# Patient Record
Sex: Female | Born: 1940 | Race: White | Hispanic: No | Marital: Married | Smoking: Former smoker
Health system: Southern US, Community
[De-identification: ages and names within clinical notes are randomized; demographics above are authoritative.]

## PROBLEM LIST (undated history)

## (undated) DIAGNOSIS — R112 Nausea with vomiting, unspecified: Secondary | ICD-10-CM

## (undated) DIAGNOSIS — R2681 Unsteadiness on feet: Secondary | ICD-10-CM

## (undated) DIAGNOSIS — I6529 Occlusion and stenosis of unspecified carotid artery: Secondary | ICD-10-CM

## (undated) DIAGNOSIS — Z9889 Other specified postprocedural states: Secondary | ICD-10-CM

## (undated) DIAGNOSIS — J189 Pneumonia, unspecified organism: Secondary | ICD-10-CM

## (undated) DIAGNOSIS — M545 Low back pain, unspecified: Secondary | ICD-10-CM

## (undated) DIAGNOSIS — I219 Acute myocardial infarction, unspecified: Secondary | ICD-10-CM

## (undated) DIAGNOSIS — E1140 Type 2 diabetes mellitus with diabetic neuropathy, unspecified: Secondary | ICD-10-CM

## (undated) DIAGNOSIS — I639 Cerebral infarction, unspecified: Secondary | ICD-10-CM

## (undated) DIAGNOSIS — N189 Chronic kidney disease, unspecified: Secondary | ICD-10-CM

## (undated) DIAGNOSIS — K219 Gastro-esophageal reflux disease without esophagitis: Secondary | ICD-10-CM

## (undated) DIAGNOSIS — D696 Thrombocytopenia, unspecified: Secondary | ICD-10-CM

## (undated) DIAGNOSIS — I10 Essential (primary) hypertension: Secondary | ICD-10-CM

## (undated) DIAGNOSIS — G8929 Other chronic pain: Secondary | ICD-10-CM

## (undated) DIAGNOSIS — D649 Anemia, unspecified: Secondary | ICD-10-CM

## (undated) DIAGNOSIS — I350 Nonrheumatic aortic (valve) stenosis: Secondary | ICD-10-CM

## (undated) DIAGNOSIS — E785 Hyperlipidemia, unspecified: Secondary | ICD-10-CM

## (undated) DIAGNOSIS — M1990 Unspecified osteoarthritis, unspecified site: Secondary | ICD-10-CM

## (undated) DIAGNOSIS — I2510 Atherosclerotic heart disease of native coronary artery without angina pectoris: Secondary | ICD-10-CM

## (undated) DIAGNOSIS — R011 Cardiac murmur, unspecified: Secondary | ICD-10-CM

## (undated) DIAGNOSIS — I5030 Unspecified diastolic (congestive) heart failure: Secondary | ICD-10-CM

## (undated) DIAGNOSIS — F419 Anxiety disorder, unspecified: Secondary | ICD-10-CM

## (undated) HISTORY — DX: Anxiety disorder, unspecified: F41.9

## (undated) HISTORY — DX: Low back pain, unspecified: M54.50

## (undated) HISTORY — DX: Nonrheumatic aortic (valve) stenosis: I35.0

## (undated) HISTORY — PX: BREAST SURGERY: SHX581

## (undated) HISTORY — DX: Chronic kidney disease, unspecified: N18.9

## (undated) HISTORY — DX: Thrombocytopenia, unspecified: D69.6

## (undated) HISTORY — PX: BACK SURGERY: SHX140

## (undated) HISTORY — DX: Anemia, unspecified: D64.9

## (undated) HISTORY — DX: Atherosclerotic heart disease of native coronary artery without angina pectoris: I25.10

## (undated) HISTORY — DX: Essential (primary) hypertension: I10

## (undated) HISTORY — PX: COLONOSCOPY W/ BIOPSIES AND POLYPECTOMY: SHX1376

## (undated) HISTORY — DX: Low back pain: M54.5

## (undated) HISTORY — DX: Unspecified diastolic (congestive) heart failure: I50.30

## (undated) HISTORY — DX: Other chronic pain: G89.29

## (undated) HISTORY — DX: Occlusion and stenosis of unspecified carotid artery: I65.29

## (undated) HISTORY — PX: CATARACT EXTRACTION W/ INTRAOCULAR LENS  IMPLANT, BILATERAL: SHX1307

## (undated) HISTORY — PX: CARDIAC CATHETERIZATION: SHX172

## (undated) HISTORY — DX: Hyperlipidemia, unspecified: E78.5

---

## 2005-02-23 ENCOUNTER — Encounter
Admission: RE | Admit: 2005-02-23 | Discharge: 2005-02-23 | Payer: Self-pay | Admitting: Physical Medicine and Rehabilitation

## 2005-03-16 ENCOUNTER — Ambulatory Visit (HOSPITAL_COMMUNITY): Admission: RE | Admit: 2005-03-16 | Discharge: 2005-03-17 | Payer: Self-pay | Admitting: Specialist

## 2005-12-26 ENCOUNTER — Encounter (INDEPENDENT_AMBULATORY_CARE_PROVIDER_SITE_OTHER): Payer: Self-pay | Admitting: Specialist

## 2005-12-26 ENCOUNTER — Ambulatory Visit (HOSPITAL_COMMUNITY): Admission: RE | Admit: 2005-12-26 | Discharge: 2005-12-26 | Payer: Self-pay | Admitting: Gastroenterology

## 2006-05-22 ENCOUNTER — Encounter: Admission: RE | Admit: 2006-05-22 | Discharge: 2006-05-22 | Payer: Self-pay | Admitting: Specialist

## 2006-06-01 ENCOUNTER — Ambulatory Visit (HOSPITAL_COMMUNITY): Admission: RE | Admit: 2006-06-01 | Discharge: 2006-06-02 | Payer: Self-pay | Admitting: Specialist

## 2007-01-30 ENCOUNTER — Emergency Department (HOSPITAL_COMMUNITY): Admission: EM | Admit: 2007-01-30 | Discharge: 2007-01-30 | Payer: Self-pay | Admitting: *Deleted

## 2008-09-15 HISTORY — PX: CORONARY ARTERY BYPASS GRAFT: SHX141

## 2008-10-07 ENCOUNTER — Ambulatory Visit: Payer: Self-pay | Admitting: Cardiovascular Disease

## 2008-10-07 ENCOUNTER — Inpatient Hospital Stay (HOSPITAL_COMMUNITY): Admission: EM | Admit: 2008-10-07 | Discharge: 2008-10-21 | Payer: Self-pay | Admitting: Emergency Medicine

## 2008-10-08 ENCOUNTER — Encounter (INDEPENDENT_AMBULATORY_CARE_PROVIDER_SITE_OTHER): Payer: Self-pay | Admitting: Internal Medicine

## 2008-10-13 ENCOUNTER — Encounter: Payer: Self-pay | Admitting: Cardiology

## 2008-10-13 ENCOUNTER — Encounter: Payer: Self-pay | Admitting: Cardiothoracic Surgery

## 2008-10-13 ENCOUNTER — Ambulatory Visit: Payer: Self-pay | Admitting: Cardiothoracic Surgery

## 2008-10-13 ENCOUNTER — Ambulatory Visit: Payer: Self-pay | Admitting: Cardiology

## 2008-10-18 ENCOUNTER — Encounter: Payer: Self-pay | Admitting: Cardiothoracic Surgery

## 2008-11-10 ENCOUNTER — Encounter: Admission: RE | Admit: 2008-11-10 | Discharge: 2008-11-10 | Payer: Self-pay | Admitting: Cardiothoracic Surgery

## 2008-11-10 ENCOUNTER — Ambulatory Visit: Payer: Self-pay | Admitting: Cardiothoracic Surgery

## 2008-11-12 DIAGNOSIS — E785 Hyperlipidemia, unspecified: Secondary | ICD-10-CM | POA: Insufficient documentation

## 2008-11-12 DIAGNOSIS — D631 Anemia in chronic kidney disease: Secondary | ICD-10-CM | POA: Insufficient documentation

## 2008-11-12 DIAGNOSIS — D696 Thrombocytopenia, unspecified: Secondary | ICD-10-CM | POA: Insufficient documentation

## 2008-11-12 DIAGNOSIS — E119 Type 2 diabetes mellitus without complications: Secondary | ICD-10-CM | POA: Insufficient documentation

## 2008-11-12 DIAGNOSIS — G4700 Insomnia, unspecified: Secondary | ICD-10-CM | POA: Insufficient documentation

## 2008-11-12 DIAGNOSIS — N185 Chronic kidney disease, stage 5: Secondary | ICD-10-CM | POA: Insufficient documentation

## 2008-11-12 DIAGNOSIS — K219 Gastro-esophageal reflux disease without esophagitis: Secondary | ICD-10-CM | POA: Insufficient documentation

## 2008-11-12 DIAGNOSIS — M545 Low back pain, unspecified: Secondary | ICD-10-CM | POA: Insufficient documentation

## 2008-11-12 DIAGNOSIS — I2510 Atherosclerotic heart disease of native coronary artery without angina pectoris: Secondary | ICD-10-CM | POA: Insufficient documentation

## 2008-11-12 DIAGNOSIS — F411 Generalized anxiety disorder: Secondary | ICD-10-CM | POA: Insufficient documentation

## 2008-11-12 DIAGNOSIS — N189 Chronic kidney disease, unspecified: Secondary | ICD-10-CM

## 2008-11-12 DIAGNOSIS — I5040 Unspecified combined systolic (congestive) and diastolic (congestive) heart failure: Secondary | ICD-10-CM | POA: Insufficient documentation

## 2008-11-17 ENCOUNTER — Encounter: Payer: Self-pay | Admitting: Cardiology

## 2008-11-17 ENCOUNTER — Ambulatory Visit: Payer: Self-pay | Admitting: Cardiology

## 2008-11-17 DIAGNOSIS — I10 Essential (primary) hypertension: Secondary | ICD-10-CM | POA: Insufficient documentation

## 2008-11-17 DIAGNOSIS — I5032 Chronic diastolic (congestive) heart failure: Secondary | ICD-10-CM | POA: Insufficient documentation

## 2008-11-17 LAB — CONVERTED CEMR LAB
Alkaline Phosphatase: 94 units/L (ref 39–117)
Creatinine, Ser: 1.9 mg/dL — ABNORMAL HIGH (ref 0.4–1.2)
Direct LDL: 34.4 mg/dL
GFR calc non Af Amer: 28.03 mL/min (ref >60)
Glucose, Bld: 90 mg/dL (ref 70–99)
HDL: 24.2 mg/dL — ABNORMAL LOW (ref >39.00)
Potassium: 4.8 meq/L (ref 3.5–5.1)
Total Bilirubin: 0.7 mg/dL (ref 0.3–1.2)
Total CHOL/HDL Ratio: 4
VLDL: 48.8 mg/dL — ABNORMAL HIGH (ref 0.0–40.0)

## 2009-01-26 ENCOUNTER — Encounter: Payer: Self-pay | Admitting: Cardiology

## 2009-02-24 ENCOUNTER — Ambulatory Visit: Payer: Self-pay | Admitting: Cardiology

## 2009-03-03 LAB — CONVERTED CEMR LAB
ALT: 16 units/L (ref 0–35)
Bilirubin, Direct: 0.2 mg/dL (ref 0.0–0.3)
LDL Cholesterol: 49 mg/dL (ref 0–99)
Total Protein: 7 g/dL (ref 6.0–8.3)
Triglycerides: 141 mg/dL (ref 0.0–149.0)
VLDL: 28.2 mg/dL (ref 0.0–40.0)

## 2009-03-13 ENCOUNTER — Telehealth: Payer: Self-pay | Admitting: Cardiology

## 2009-03-25 ENCOUNTER — Ambulatory Visit: Payer: Self-pay | Admitting: Cardiology

## 2009-03-27 LAB — CONVERTED CEMR LAB
ALT: 18 units/L (ref 0–35)
Alkaline Phosphatase: 62 units/L (ref 39–117)
LDL Cholesterol: 51 mg/dL (ref 0–99)
Total Protein: 7.2 g/dL (ref 6.0–8.3)

## 2009-04-08 ENCOUNTER — Ambulatory Visit: Payer: Self-pay | Admitting: Cardiology

## 2009-04-08 ENCOUNTER — Telehealth: Payer: Self-pay | Admitting: Cardiology

## 2009-04-10 ENCOUNTER — Telehealth: Payer: Self-pay | Admitting: Cardiology

## 2009-04-13 LAB — CONVERTED CEMR LAB
BUN: 30 mg/dL — ABNORMAL HIGH (ref 6–23)
CO2: 29 meq/L (ref 19–32)
Chloride: 97 meq/L (ref 96–112)
Creatinine, Ser: 1.6 mg/dL — ABNORMAL HIGH (ref 0.4–1.2)
GFR calc non Af Amer: 34.14 mL/min (ref >60)
Glucose, Bld: 88 mg/dL (ref 70–99)
Potassium: 4.2 meq/L (ref 3.5–5.1)
Sodium: 133 meq/L — ABNORMAL LOW (ref 135–145)

## 2009-09-17 ENCOUNTER — Ambulatory Visit: Payer: Self-pay | Admitting: Cardiology

## 2009-09-17 DIAGNOSIS — I359 Nonrheumatic aortic valve disorder, unspecified: Secondary | ICD-10-CM | POA: Insufficient documentation

## 2009-10-22 ENCOUNTER — Encounter: Payer: Self-pay | Admitting: Cardiology

## 2009-10-22 ENCOUNTER — Ambulatory Visit: Payer: Self-pay

## 2009-10-29 DIAGNOSIS — I6529 Occlusion and stenosis of unspecified carotid artery: Secondary | ICD-10-CM | POA: Insufficient documentation

## 2009-11-17 ENCOUNTER — Telehealth: Payer: Self-pay | Admitting: Cardiology

## 2010-04-14 ENCOUNTER — Ambulatory Visit: Payer: Self-pay | Admitting: Oncology

## 2010-04-20 LAB — CBC & DIFF AND RETIC
BASO%: 0.3 % (ref 0.0–2.0)
HCT: 29.4 % — ABNORMAL LOW (ref 34.8–46.6)
HGB: 10 g/dL — ABNORMAL LOW (ref 11.6–15.9)
MCHC: 34 g/dL (ref 31.5–36.0)
MONO#: 0.8 10*3/uL (ref 0.1–0.9)
Platelets: 118 10*3/uL — ABNORMAL LOW (ref 145–400)
RDW: 13 % (ref 11.2–14.5)
WBC: 6.4 10*3/uL (ref 3.9–10.3)
lymph#: 1.3 10*3/uL (ref 0.9–3.3)

## 2010-04-20 LAB — CHCC SMEAR

## 2010-04-22 LAB — SPEP & IFE WITH QIG
Alpha-1-Globulin: 5.8 % — ABNORMAL HIGH (ref 2.9–4.9)
Alpha-2-Globulin: 13.8 % — ABNORMAL HIGH (ref 7.1–11.8)
Beta 2: 4.1 % (ref 3.2–6.5)
Beta Globulin: 5.6 % (ref 4.7–7.2)
IgG (Immunoglobin G), Serum: 902 mg/dL (ref 694–1618)
Total Protein, Serum Electrophoresis: 6.9 g/dL (ref 6.0–8.3)

## 2010-04-22 LAB — COMPREHENSIVE METABOLIC PANEL
Albumin: 4.2 g/dL (ref 3.5–5.2)
Alkaline Phosphatase: 74 U/L (ref 39–117)
Calcium: 9.7 mg/dL (ref 8.4–10.5)
Creatinine, Ser: 1.65 mg/dL — ABNORMAL HIGH (ref 0.40–1.20)
Glucose, Bld: 127 mg/dL — ABNORMAL HIGH (ref 70–99)
Sodium: 127 mEq/L — ABNORMAL LOW (ref 135–145)
Total Bilirubin: 0.3 mg/dL (ref 0.3–1.2)
Total Protein: 6.9 g/dL (ref 6.0–8.3)

## 2010-04-22 LAB — FERRITIN: Ferritin: 227 ng/mL (ref 10–291)

## 2010-04-22 LAB — KAPPA/LAMBDA LIGHT CHAINS
Kappa free light chain: 2.99 mg/dL — ABNORMAL HIGH (ref 0.33–1.94)
Lambda Free Lght Chn: 3.19 mg/dL — ABNORMAL HIGH (ref 0.57–2.63)

## 2010-04-22 LAB — FOLATE: Folate: >20 ng/mL

## 2010-04-22 LAB — ERYTHROPOIETIN: Erythropoietin: 22.3 m[IU]/mL (ref 2.6–34.0)

## 2010-05-24 ENCOUNTER — Ambulatory Visit: Payer: Self-pay | Admitting: Oncology

## 2010-05-26 LAB — CBC WITH DIFFERENTIAL/PLATELET
HCT: 30.8 % — ABNORMAL LOW (ref 34.8–46.6)
HGB: 10.6 g/dL — ABNORMAL LOW (ref 11.6–15.9)
LYMPH%: 21.2 % (ref 14.0–49.7)
MCV: 103.6 fL — ABNORMAL HIGH (ref 79.5–101.0)
MONO#: 0.9 10*3/uL (ref 0.1–0.9)
NEUT#: 3.3 10*3/uL (ref 1.5–6.5)
RDW: 13.6 % (ref 11.2–14.5)
lymph#: 1.2 10*3/uL (ref 0.9–3.3)

## 2010-06-16 LAB — CBC WITH DIFFERENTIAL/PLATELET
BASO%: 0.4 % (ref 0.0–2.0)
EOS%: 6.9 % (ref 0.0–7.0)
HGB: 12.4 g/dL (ref 11.6–15.9)
LYMPH%: 18.7 % (ref 14.0–49.7)
MCH: 34.8 pg — ABNORMAL HIGH (ref 25.1–34.0)
MCHC: 34 g/dL (ref 31.5–36.0)
MCV: 102.3 fL — ABNORMAL HIGH (ref 79.5–101.0)
MONO%: 15.4 % — ABNORMAL HIGH (ref 0.0–14.0)
NEUT#: 3.6 10*3/uL (ref 1.5–6.5)
NEUT%: 58.6 % (ref 38.4–76.8)
Platelets: 122 10*3/uL — ABNORMAL LOW (ref 145–400)
RBC: 3.58 10*6/uL — ABNORMAL LOW (ref 3.70–5.45)
WBC: 6.2 10*3/uL (ref 3.9–10.3)

## 2010-07-05 ENCOUNTER — Ambulatory Visit: Payer: Self-pay | Admitting: Oncology

## 2010-07-07 LAB — CBC WITH DIFFERENTIAL/PLATELET
BASO%: 0.4 % (ref 0.0–2.0)
EOS%: 6.2 % (ref 0.0–7.0)
LYMPH%: 20.2 % (ref 14.0–49.7)
MCH: 35.2 pg — ABNORMAL HIGH (ref 25.1–34.0)
MCHC: 35 g/dL (ref 31.5–36.0)
MONO#: 0.7 10*3/uL (ref 0.1–0.9)
WBC: 6.3 10*3/uL (ref 3.9–10.3)
lymph#: 1.3 10*3/uL (ref 0.9–3.3)

## 2010-07-28 LAB — CBC WITH DIFFERENTIAL/PLATELET
HCT: 34.1 % — ABNORMAL LOW (ref 34.8–46.6)
MCV: 102.5 fL — ABNORMAL HIGH (ref 79.5–101.0)
MONO#: 0.7 10*3/uL (ref 0.1–0.9)
MONO%: 12.2 % (ref 0.0–14.0)
NEUT#: 3.7 10*3/uL (ref 1.5–6.5)

## 2010-08-12 ENCOUNTER — Ambulatory Visit: Payer: Self-pay | Admitting: Oncology

## 2010-08-18 LAB — CBC WITH DIFFERENTIAL/PLATELET
BASO%: 0.3 % (ref 0.0–2.0)
Basophils Absolute: 0 10*3/uL (ref 0.0–0.1)
EOS%: 6.4 % (ref 0.0–7.0)
Eosinophils Absolute: 0.4 10*3/uL (ref 0.0–0.5)
HCT: 31 % — ABNORMAL LOW (ref 34.8–46.6)
HGB: 10.6 g/dL — ABNORMAL LOW (ref 11.6–15.9)
LYMPH%: 18 % (ref 14.0–49.7)
MCH: 33.7 pg (ref 25.1–34.0)
MCHC: 34.2 g/dL (ref 31.5–36.0)
MCV: 98.4 fL (ref 79.5–101.0)
MONO#: 0.6 10*3/uL (ref 0.1–0.9)
MONO%: 8.7 % (ref 0.0–14.0)
NEUT#: 4.6 10*3/uL (ref 1.5–6.5)
NEUT%: 66.6 % (ref 38.4–76.8)
Platelets: 98 10*3/uL — ABNORMAL LOW (ref 145–400)
RBC: 3.15 10*6/uL — ABNORMAL LOW (ref 3.70–5.45)
RDW: 13.1 % (ref 11.2–14.5)
WBC: 6.9 10*3/uL (ref 3.9–10.3)
lymph#: 1.2 10*3/uL (ref 0.9–3.3)

## 2010-09-08 LAB — CBC WITH DIFFERENTIAL/PLATELET
HCT: 34.5 % — ABNORMAL LOW (ref 34.8–46.6)
LYMPH%: 14.8 % (ref 14.0–49.7)
MCH: 35.1 pg — ABNORMAL HIGH (ref 25.1–34.0)
MONO#: 0.9 10*3/uL (ref 0.1–0.9)
MONO%: 12.3 % (ref 0.0–14.0)
Platelets: 128 10*3/uL — ABNORMAL LOW (ref 145–400)
RDW: 14.9 % — ABNORMAL HIGH (ref 11.2–14.5)
WBC: 7.3 10*3/uL (ref 3.9–10.3)
lymph#: 1.1 10*3/uL (ref 0.9–3.3)

## 2010-09-12 LAB — CONVERTED CEMR LAB
AST: 21 units/L (ref 0–37)
Alkaline Phosphatase: 71 units/L (ref 39–117)
Basophils Absolute: 0 10*3/uL (ref 0.0–0.1)
Basophils Relative: 0.3 % (ref 0.0–3.0)
Bilirubin, Direct: 0.1 mg/dL (ref 0.0–0.3)
Cholesterol: 140 mg/dL
Creatinine, Ser: 1.4 mg/dL — ABNORMAL HIGH (ref 0.4–1.2)
Eosinophils Absolute: 0.2 10*3/uL (ref 0.0–0.7)
Glucose, Bld: 116 mg/dL — ABNORMAL HIGH (ref 70–99)
HDL: 62.3 mg/dL
LDL Cholesterol: 51 mg/dL
MCHC: 33.5 g/dL (ref 30.0–36.0)
Neutro Abs: 2.5 10*3/uL (ref 1.4–7.7)
RDW: 12.8 % (ref 11.5–14.6)
Total Bilirubin: 0.4 mg/dL (ref 0.3–1.2)
Total CHOL/HDL Ratio: 2
Triglycerides: 133 mg/dL
VLDL: 26.6 mg/dL

## 2010-09-14 NOTE — Progress Notes (Signed)
Summary: note for dental cleaning  Phone Note Call from Patient Call back at Home Phone [phone]   Caller: Patient Reason for Call: Talk to Nurse Summary of Call: pt going to dentist office when 4-/11@ 12noon. pt need an o.k,. for dental cleaning. fax # [phone].  Initial call taken by: Neil Crouch,  November 17, 2009 4:06 PM  Follow-up for Phone Call        reveiwed with Dr Maia Plan Moise Boring guidelines no  indication for antibiotic prophylaxis prior to dental cleaning--I verifed with pt she has no history of endocarditis

## 2010-09-14 NOTE — Assessment & Plan Note (Signed)
Summary: 6 month/dmp   Visit Type:  Follow-up Primary Provider:  Kathryne Eriksson, MD   History of Present Illness: 70 yo with h/o CAD s/p CABG and diastolic CHF presents for followup.  She has been doing well at home.  She is not getting as much exercise as she needs to be getting but is walking her dog daily.  No chest pain and no exertional dyspnea.  BP has been good at home and is 130/72 today here.  She feels considerably better than she did before CABG.    ECG: NSR, Qs in V1 and V2  Labs (4/10): creatinine 1.9 Labs (7/10): LDL 49, HDL 51, LFTs normal Labs (1/11): K 5.0, creatinine 1.4  Current Medications (verified): 1)  Amaryl 2 Mg Tabs (Glimepiride) .... Take One Tablet By Mouth Once Daily. 2)  Zocor 40 Mg Tabs (Simvastatin) 3)  Omeprazole 20 Mg Tbec (Omeprazole) .... Once Daily 4)  Alprazolam 0.5 Mg Tabs (Alprazolam) .[name].. 1-2 Two Times A Day 5)  Betimol 0.5 % Soln (Timolol) .... Both Eyes Two Times A Day 6)  Aspirin Adult Low Strength 81 Mg Tbec (Aspirin) .[name].. 1 Tab Once Daily 7)  Lopressor 50 Mg Tabs (Metoprolol Tartrate) .... Two Times A Day 8)  Multivitamins  Tabs (Multiple Vitamin) .[name].. 1 Tab Once Daily 9)  Benicar Hct 20-12.5 Mg Tabs (Olmesartan Medoxomil-Hctz) .... Once Daily 10)  Niaspan 500 Mg Cr-Tabs (Niacin (Antihyperlipidemic)) .... Take Two Tablets At Bedtime 11)  Metformin Hcl 1000 Mg Tabs (Metformin Hcl) .... Take One Tablet Two Times A Day 12)  Nu-Iron 150 Mg Caps (Polysaccharide Iron Complex) .... At Bedtime 13)  Norvasc 5 Mg Tabs (Amlodipine Besylate) .[name].. 1 Daily 14)  Vitamin C 500 Mg  Tabs (Ascorbic Acid) .... Two Times A Day 15)  Vitamin B-12 1000 Mcg Tabs (Cyanocobalamin) .... Take One Tablet By Mouth Once Daily.  Allergies (verified): No Known Drug Allergies  Past History:  Past Medical History: 1.  CAD:  Pt presented 2/10 to [facility] with NSTEMI and diastolic CHF exacerbation.  LHC was done 3/10 showing 99% pRCA stenosis and 80% calcified pLAD  stenosis with L=>R collaterals.  Pt was referred for CABG which was done by Dr. Prescott Gum with LIMA-LAD, SVG-RCA, SVG-OM.   2.  Diastolic CHF:  Echo ([id number]) showed EF 55-65%, mild LVH, diastolic dysfunction, mild AS with mean gradient 12 mmHg, PASP 43 mmHg.  3.  CKD:  Last creatinine 1.4 (1/11). 4.  DM2 5.  HTN 6.  Hyperlipidemia 7.  Anxiety 8.  Chronic low back pain 9.  History of thrombocytopenia 10.  Anemia: Pt is taking iron.  11.  Mild aortic stenosis: mean gradient 12 mmHg in 2/10.  12.  Mild carotid stenosis by pre-CABG carotid dopplers.   Family History: Reviewed history from 11/17/2008 and no changes required. Pt was adopted, does not know biological family's history.   Social History: Reviewed history from 11/17/2008 and no changes required. Married, lives in [place].  Retired from [employer].  Quit smoking around 1990.  Occasional ETOH.  Has 1 son.    Review of Systems       All systems reviewed and negative except as per HPI.   Vital Signs:  Patient profile:   70 year old female Height:      68 inches Weight:      165 pounds BMI:     25.18 Pulse rate:   65 / minute BP sitting:   130 / 72  (left arm)  Vitals Entered By: Margaretmary Bayley CMA (September 17, 2009 10:13 AM)  Physical Exam  General:  Well developed, well nourished, in no acute distress. Neck:  Neck supple.  JVP 7 cm. No masses, thyromegaly or abnormal cervical nodes. Lungs:  Clear bilaterally to auscultation and percussion. Heart:  Non-displaced PMI, chest non-tender; regular rate and rhythm, S1, S2.  2/6 crescendo-decrescendo murmur early-peaking at RUSB. Carotid upstroke normal, bilateral soft bruits (versus radiation from aortic stenosis murmur). 1+ PT pulses bilaterally. No edema, no varicosities. Abdomen:  Bowel sounds positive; abdomen soft and non-tender without masses, organomegaly, or hernias noted. No hepatosplenomegaly. Extremities:  No clubbing or cyanosis. Neurologic:  Alert and oriented x  3. Psych:  Normal affect.   Impression & Recommendations:  Problem # 1:  DIASTOLIC HEART FAILURE, CHRONIC (ICD-428.32) Stable and minimally symptomatic though not as active as she ought to be.   Problem # 2:  HYPERTENSION, UNSPECIFIED (ICD-401.9) BP is under good control.    Problem # 3:  CAD (ICD-414.00) Stable s/p CABG with no ischemic symptoms.  Continue statin, ASA, metoprolol, ARB.   Problem # 4:  CAROTID STENOSIS Mild carotid disease by pre-CABG dopplers.  Has carotid bruits versus radiation from AS murmur.  Repeat study this month.   Problem # 5:  AORTIC STENOSIS (ICD-424.1) Mild.  Repeat echo in 2/12.   Problem # 6:  HYPERLIPIDEMIA-MIXED (B2193296.4) Continue Zocor and Niaspan.  Needs lipids/LFTs.   Other Orders: Carotid Duplex (Carotid Duplex) Echocardiogram (Echo) TLB-Hepatic/Liver Function Pnl (80076-HEPATIC) TLB-BMP (Basic Metabolic Panel-BMET) (99991111) TLB-CBC Platelet - w/Differential (85025-CBCD)  Patient Instructions: 1)  Your physician recommends that you schedule a follow-up appointment in: 6 months 2)  Your physician has requested that you have a carotid duplex. This test is an ultrasound of the carotid arteries in your neck. It looks at blood flow through these arteries that supply the brain with blood. Allow one hour for this exam. There are no restrictions or special instructions. please sched for now 3)  Your physician has requested that you have an echocardiogram.  Echocardiography is a painless test that uses sound waves to create images of your heart. It provides your doctor with information about the size and shape of your heart and how well your heart's chambers and valves are working.  This procedure takes approximately one hour. There are no restrictions for this procedure. please sched for 1 year Prescriptions: NORVASC 5 MG TABS (AMLODIPINE BESYLATE) 1 daily  #90 x 3   Entered by:   Leodis Sias, RN   Authorized by:   Loralie Champagne, MD    Signed by:   Leodis Sias, RN on 09/17/2009   Method used:   Electronically to        [facility] (retail[facility]       [address]       [place], [place]  [address]       Ph: [phone] or [phone]       Fax: [phone]   RxID:   [id number] LOPRESSOR 50 MG TABS (METOPROLOL TARTRATE) two times a day  #90 x 3   Entered by:   Leodis Sias, RN   Authorized by:   Loralie Champagne, MD   Signed by:   Leodis Sias, RN on 09/17/2009   Method used:   [facility]ronically to        [phone] (retail)       [id number]  [place], [place]  [address]       Ph: [phone] or [phone]       Fax: [phone]   RxID:   [id number]

## 2010-09-20 ENCOUNTER — Ambulatory Visit (HOSPITAL_COMMUNITY): Payer: Medicare Other | Attending: Cardiology

## 2010-09-20 DIAGNOSIS — I10 Essential (primary) hypertension: Secondary | ICD-10-CM | POA: Insufficient documentation

## 2010-09-20 DIAGNOSIS — I359 Nonrheumatic aortic valve disorder, unspecified: Secondary | ICD-10-CM

## 2010-09-20 DIAGNOSIS — I079 Rheumatic tricuspid valve disease, unspecified: Secondary | ICD-10-CM | POA: Insufficient documentation

## 2010-09-20 DIAGNOSIS — I379 Nonrheumatic pulmonary valve disorder, unspecified: Secondary | ICD-10-CM | POA: Insufficient documentation

## 2010-09-20 DIAGNOSIS — E119 Type 2 diabetes mellitus without complications: Secondary | ICD-10-CM | POA: Insufficient documentation

## 2010-09-20 DIAGNOSIS — I080 Rheumatic disorders of both mitral and aortic valves: Secondary | ICD-10-CM | POA: Insufficient documentation

## 2010-09-20 DIAGNOSIS — E785 Hyperlipidemia, unspecified: Secondary | ICD-10-CM | POA: Insufficient documentation

## 2010-09-29 ENCOUNTER — Encounter (HOSPITAL_BASED_OUTPATIENT_CLINIC_OR_DEPARTMENT_OTHER): Payer: Medicare Other | Admitting: Oncology

## 2010-09-29 ENCOUNTER — Other Ambulatory Visit: Payer: Self-pay | Admitting: Oncology

## 2010-09-29 DIAGNOSIS — D539 Nutritional anemia, unspecified: Secondary | ICD-10-CM

## 2010-09-29 DIAGNOSIS — D696 Thrombocytopenia, unspecified: Secondary | ICD-10-CM

## 2010-09-29 DIAGNOSIS — D649 Anemia, unspecified: Secondary | ICD-10-CM

## 2010-09-29 DIAGNOSIS — N289 Disorder of kidney and ureter, unspecified: Secondary | ICD-10-CM

## 2010-09-29 LAB — CBC WITH DIFFERENTIAL/PLATELET
BASO%: 0.5 % (ref 0.0–2.0)
Basophils Absolute: 0 10*3/uL (ref 0.0–0.1)
EOS%: 7.8 % — ABNORMAL HIGH (ref 0.0–7.0)
MONO#: 0.8 10*3/uL (ref 0.1–0.9)
MONO%: 10.7 % (ref 0.0–14.0)
NEUT%: 64.6 % (ref 38.4–76.8)
Platelets: 132 10*3/uL — ABNORMAL LOW (ref 145–400)

## 2010-10-01 ENCOUNTER — Telehealth: Payer: Self-pay | Admitting: Cardiology

## 2010-10-06 NOTE — Progress Notes (Signed)
Summary: calling about carotid  Phone Note Call from Patient Call back at Home Phone [phone]   Caller: Patient Summary of Call: calling about a carotid Initial call taken by: Delsa Sale,  October 01, 2010 11:35 AM  Follow-up for Phone Call        pt transferrred to [facility] to schedule carotid

## 2010-10-13 ENCOUNTER — Encounter (INDEPENDENT_AMBULATORY_CARE_PROVIDER_SITE_OTHER): Payer: Medicare Other

## 2010-10-13 ENCOUNTER — Encounter: Payer: Self-pay | Admitting: Cardiology

## 2010-10-13 DIAGNOSIS — I6529 Occlusion and stenosis of unspecified carotid artery: Secondary | ICD-10-CM

## 2010-10-20 ENCOUNTER — Encounter (HOSPITAL_BASED_OUTPATIENT_CLINIC_OR_DEPARTMENT_OTHER): Payer: Medicare Other | Admitting: Oncology

## 2010-10-20 ENCOUNTER — Other Ambulatory Visit: Payer: Self-pay | Admitting: Oncology

## 2010-10-20 DIAGNOSIS — D539 Nutritional anemia, unspecified: Secondary | ICD-10-CM

## 2010-10-20 LAB — CBC WITH DIFFERENTIAL/PLATELET
EOS%: 4.3 % (ref 0.0–7.0)
Eosinophils Absolute: 0.3 10*3/uL (ref 0.0–0.5)
HCT: 32.8 % — ABNORMAL LOW (ref 34.8–46.6)
HGB: 11.2 g/dL — ABNORMAL LOW (ref 11.6–15.9)
MCH: 34.8 pg — ABNORMAL HIGH (ref 25.1–34.0)
MONO#: 0.7 10*3/uL (ref 0.1–0.9)
Platelets: 107 10*3/uL — ABNORMAL LOW (ref 145–400)
WBC: 6.3 10*3/uL (ref 3.9–10.3)

## 2010-11-01 ENCOUNTER — Other Ambulatory Visit: Payer: Self-pay | Admitting: Cardiology

## 2010-11-01 ENCOUNTER — Ambulatory Visit (INDEPENDENT_AMBULATORY_CARE_PROVIDER_SITE_OTHER): Payer: Medicare Other | Admitting: Cardiology

## 2010-11-01 ENCOUNTER — Encounter: Payer: Self-pay | Admitting: Cardiology

## 2010-11-01 DIAGNOSIS — I739 Peripheral vascular disease, unspecified: Secondary | ICD-10-CM | POA: Insufficient documentation

## 2010-11-01 DIAGNOSIS — I2510 Atherosclerotic heart disease of native coronary artery without angina pectoris: Secondary | ICD-10-CM

## 2010-11-01 DIAGNOSIS — I70219 Atherosclerosis of native arteries of extremities with intermittent claudication, unspecified extremity: Secondary | ICD-10-CM

## 2010-11-01 DIAGNOSIS — I6529 Occlusion and stenosis of unspecified carotid artery: Secondary | ICD-10-CM

## 2010-11-08 ENCOUNTER — Encounter: Payer: Self-pay | Admitting: Cardiology

## 2010-11-10 ENCOUNTER — Other Ambulatory Visit: Payer: Self-pay | Admitting: Oncology

## 2010-11-10 ENCOUNTER — Encounter (HOSPITAL_BASED_OUTPATIENT_CLINIC_OR_DEPARTMENT_OTHER): Payer: Medicare Other | Admitting: Oncology

## 2010-11-10 DIAGNOSIS — D649 Anemia, unspecified: Secondary | ICD-10-CM

## 2010-11-10 DIAGNOSIS — N289 Disorder of kidney and ureter, unspecified: Secondary | ICD-10-CM

## 2010-11-10 DIAGNOSIS — D539 Nutritional anemia, unspecified: Secondary | ICD-10-CM

## 2010-11-10 LAB — CBC WITH DIFFERENTIAL/PLATELET
Basophils Absolute: 0 10*3/uL (ref 0.0–0.1)
Eosinophils Absolute: 0.4 10*3/uL (ref 0.0–0.5)
HCT: 30.6 % — ABNORMAL LOW (ref 34.8–46.6)
HGB: 10.9 g/dL — ABNORMAL LOW (ref 11.6–15.9)
MCV: 95.3 fL (ref 79.5–101.0)
MONO%: 11.2 % (ref 0.0–14.0)
NEUT#: 4.6 10*3/uL (ref 1.5–6.5)
NEUT%: 62.9 % (ref 38.4–76.8)
Platelets: 102 10*3/uL — ABNORMAL LOW (ref 145–400)
RDW: 12.6 % (ref 11.2–14.5)
WBC: 7.2 10*3/uL (ref 3.9–10.3)

## 2010-11-11 NOTE — Assessment & Plan Note (Signed)
Summary: [name]   Primary Provider:  Kathryne Eriksson, MD   History of Present Illness: 70 yo with h/o CAD s/p CABG and diastolic CHF presents for followup.  She has been doing well at home.   No chest pain and no exertional dyspnea.  She is exercising with a stationary bike at home.  Her main limitation now seems to be bilateral leg heaviness and soreness with exertion.  Her BP is high todya and systolic BP has been running in the 140s at home.  Diabetes has been well-controlled.    ECG: NSR, Qs in V1 and V2  Labs (4/10): creatinine 1.9 Labs (7/10): LDL 49, HDL 51, LFTs normal Labs (1/11): K 5.0, creatinine 1.4 Labs (2/11): LDL 51, HDL 62, HCT 27  Current Medications (verified): 1)  Zocor 40 Mg Tabs (Simvastatin) 2)  Omeprazole 20 Mg Tbec (Omeprazole) .... Once Daily 3)  Alprazolam 0.5 Mg Tabs (Alprazolam) .[name].. 1-2 Two Times A Day 4)  Betimol 0.5 % Soln (Timolol) .... Both Eyes Two Times A Day 5)  Aspirin Adult Low Strength 81 Mg Tbec (Aspirin) .[name].. 1 Tab Once Daily 6)  Lopressor 50 Mg Tabs (Metoprolol Tartrate) .... Two Times A Day 7)  Multivitamins  Tabs (Multiple Vitamin) .[name].. 1 Tab Once Daily 8)  Benicar Hct 20-12.5 Mg Tabs (Olmesartan Medoxomil-Hctz) .... Once Daily 9)  Niaspan 1000 Mg Cr-Tabs (Niacin (Antihyperlipidemic)) .[name].. 1 Tablet At Bedtime 10)  Metformin Hcl 1000 Mg Tabs (Metformin Hcl) .... Take One Tablet Two Times A Day 11)  Norvasc 5 Mg Tabs (Amlodipine Besylate) .[name].. 1 Daily 12)  Vitamin B-12 1000 Mcg Tabs (Cyanocobalamin) .... Take One Tablet By Mouth Once Daily.  Allergies (verified): No Known Drug Allergies  Past History:  Past Medical History: 1.  CAD:  Pt presented 2/10 to [facility] with NSTEMI and diastolic CHF exacerbation.  LHC was done 3/10 showing 99% pRCA stenosis and 80% calcified pLAD stenosis with L=>R collaterals.  Pt was referred for CABG which was done by Dr. Prescott Gum with LIMA-LAD, SVG-RCA, SVG-OM.   2.  Diastolic CHF:  Echo ([id number])  showed EF 55-65%, mild LVH, diastolic dysfunction, mild AS with mean gradient 12 mmHg, PASP 43 mmHg.  Echo (2/12): EF 55-60%, mild LVH, mild AS (mean gradient 12), PA systolic pressure 32 mmHg.  3.  CKD:  Last creatinine 1.4. 4.  DM2 5.  HTN 6.  Hyperlipidemia 7.  Anxiety 8.  Chronic low back pain 9.  History of thrombocytopenia 10.  Anemia: Pt is taking iron.  11.  Mild aortic stenosis: mean gradient 12 mmHg in 2/12.  12.  Carotid stenosis: 40-59% bilateral ICA stenosis in 2/12.   Family History: Reviewed history from 11/17/2008 and no changes required. Pt was adopted, does not know biological family's history.   Social History: Reviewed history from 11/17/2008 and no changes required. Married, lives in [place].  Retired from [employer].  Quit smoking around 1990.  Occasional ETOH.  Has 1 son.    Review of Systems       All systems reviewed and negative except as per HPI.   Vital Signs:  Patient profile:   70 year old female Height:      68 inches Weight:      154 pounds BMI:     23.50 Pulse rate:   63 / minute Pulse rhythm:   regular BP sitting:   150 / 68  (left arm) Cuff size:   large  Vitals Entered By: Doug Sou CMA (  November 01, 2010 9:51 AM)  Physical Exam  General:  Well developed, well nourished, in no acute distress. Neck:  Neck supple, no JVD. No masses, thyromegaly or abnormal cervical nodes. Lungs:  Clear bilaterally to auscultation and percussion. Heart:  Non-displaced PMI, chest non-tender; regular rate and rhythm, S1, S2.  2/6 crescendo-decrescendo murmur early-peaking at RUSB. Carotid upstroke normal, bilateral soft bruits.  Weak PT pulse on left, unable to palpate PT pulse on the right. No edema, no varicosities. Abdomen:  Bowel sounds positive; abdomen soft and non-tender without masses, organomegaly, or hernias noted. No hepatosplenomegaly. Extremities:  No clubbing or cyanosis. Neurologic:  Alert and oriented x 3. Psych:  Normal  affect.   Impression & Recommendations:  Problem # 1:  CAD (ICD-414.00) Stable s/p CABG with no ischemic symptoms.  Continue statin, ASA, metoprolol, ARB.   Problem # 2:  CLAUDICATION (E1707615.9) Patient has bilateral leg heaviness and soreness with ambulation.  Difficult to palpate pedal pulses.  Will get lower extremity arterial dopplers to assess for any evidence of significant PAD.   Problem # 3:  AORTIC STENOSIS (ICD-424.1) Mild AS, stable on last echo in 2/12.   Problem # 4:  CAROTID ARTERY STENOSIS (ICD-433.10) Moderate carotid stenosis.  Stable on last carotid doppler exam in 2/12.  Repeat carotid dopplers in 2/13.   Problem # 5:  HYPERLIPIDEMIA-MIXED (B2193296.4) Goal LDL < 70.  She had labs done by Dr. Redmond Pulling last month per her report.  I will try to get the results.    Problem # 6:  HYPERTENSION, UNSPECIFIED (ICD-401.9) Increase amlodipine to 10 mg daily with BP check in 2 wks.   Other Orders: Arterial Duplex Lower Extremity (Arterial Duplex Low) Carotid Duplex (Carotid Duplex)  Patient Instructions: 1)  Your physician has recommended you make the following change in your medication:  2)  Increase Norvasc to 10mg  daily--you can take two 5mg  tablets daily at the same time. 3)  Take and record your blood pressure about 2 hours after you take your medication. I will call you in 2 weeks to get the readings.[place]  4)  Your physician has requested that you have a lower extremity arterial duplex.  This test is an ultrasound of the arteries in the legs. It looks at arterial blood flow in the legs and arms.  Allow one hour for Lower  Arterial scans. There are no restrictions or special instructions. 5)  Your physician has requested that you have a carotid duplex. This test is an ultrasound of the carotid arteries in your neck. It looks at blood flow through these arteries that supply the brain with blood. Allow one hour for this exam. There are no restrictions or special  instructions. FEBRUARY  2013 6)  Your physician wants you to follow-up in: 1 year with Dr Aundra Dubin.(MARCH 2013)  You will receive a reminder letter in the mail two months in advance. If you don't receive a letter, please call our office to schedule the follow-up appointment. Prescriptions: NORVASC 10 MG TABS (AMLODIPINE BESYLATE) one daily  #90 x 3   Entered by:   Desiree Lucy, RN, BSN   Authorized by:   Loralie Champagne, MD   Signed by:   Desiree Lucy, RN, BSN on 11/01/2010   Method used:   Electronically to        [facility]* (mail-order)       [address] [place]  [address]  Ph: [phone]       Fax: [phone]   RxIDMS:[id number] NORVASC 10 MG TABS (AMLODIPINE BESYLATE) one daily  #30 x 11   Entered by:   Desiree Lucy, RN, BSN   Authorized by:   Loralie Champagne, MD   Signed by:   Desiree Lucy, RN, BSN on 11/01/2010   Method used:   [facility] to        [id number]       [address]       [place], [place]  [address]       Ph: [phone] or [phone]       Fax: [phone]   RxID:   [id number]

## 2010-11-12 ENCOUNTER — Other Ambulatory Visit: Payer: Self-pay | Admitting: Cardiology

## 2010-11-12 ENCOUNTER — Other Ambulatory Visit: Payer: Self-pay | Admitting: *Deleted

## 2010-11-12 ENCOUNTER — Encounter (INDEPENDENT_AMBULATORY_CARE_PROVIDER_SITE_OTHER): Payer: Medicare Other | Admitting: *Deleted

## 2010-11-12 DIAGNOSIS — I739 Peripheral vascular disease, unspecified: Secondary | ICD-10-CM

## 2010-11-16 ENCOUNTER — Telehealth: Payer: Self-pay | Admitting: *Deleted

## 2010-11-16 NOTE — Telephone Encounter (Signed)
I talked with pt

## 2010-11-16 NOTE — Telephone Encounter (Signed)
Good BP

## 2010-11-16 NOTE — Telephone Encounter (Signed)
I talked with pt about recent BP readings. 11/02/10 115/61   11/03/10 124/69    11/04/10 126/76   11/05/10 121/80   11/06/10 108/62 11/07/10 119/67   11/08/10 105/66   11/09/10 126/66   11/10/10 108/60   11/11/10 116/72   11/12/10/122/65   11/14/10 114/61   11/15/10 115/63--pt increased Norvasc to 10mg  11/02/10--pt feels good without complaints. I will forward to Dr Aundra Dubin for review

## 2010-11-16 NOTE — Telephone Encounter (Signed)
Pt given results of LEA done 11/12/10. Report reviewed by Dr Aundra Dubin forwarded to HIM to be scanned into EMR

## 2010-11-18 ENCOUNTER — Encounter: Payer: Self-pay | Admitting: Cardiology

## 2010-11-25 LAB — GLUCOSE, CAPILLARY
Glucose-Capillary: 108 mg/dL — ABNORMAL HIGH (ref 70–99)
Glucose-Capillary: 113 mg/dL — ABNORMAL HIGH (ref 70–99)
Glucose-Capillary: 114 mg/dL — ABNORMAL HIGH (ref 70–99)
Glucose-Capillary: 117 mg/dL — ABNORMAL HIGH (ref 70–99)
Glucose-Capillary: 123 mg/dL — ABNORMAL HIGH (ref 70–99)
Glucose-Capillary: 127 mg/dL — ABNORMAL HIGH (ref 70–99)
Glucose-Capillary: 132 mg/dL — ABNORMAL HIGH (ref 70–99)
Glucose-Capillary: 134 mg/dL — ABNORMAL HIGH (ref 70–99)
Glucose-Capillary: 136 mg/dL — ABNORMAL HIGH (ref 70–99)
Glucose-Capillary: 137 mg/dL — ABNORMAL HIGH (ref 70–99)
Glucose-Capillary: 137 mg/dL — ABNORMAL HIGH (ref 70–99)
Glucose-Capillary: 139 mg/dL — ABNORMAL HIGH (ref 70–99)
Glucose-Capillary: 142 mg/dL — ABNORMAL HIGH (ref 70–99)
Glucose-Capillary: 154 mg/dL — ABNORMAL HIGH (ref 70–99)
Glucose-Capillary: 158 mg/dL — ABNORMAL HIGH (ref 70–99)
Glucose-Capillary: 162 mg/dL — ABNORMAL HIGH (ref 70–99)
Glucose-Capillary: 162 mg/dL — ABNORMAL HIGH (ref 70–99)
Glucose-Capillary: 165 mg/dL — ABNORMAL HIGH (ref 70–99)
Glucose-Capillary: 174 mg/dL — ABNORMAL HIGH (ref 70–99)
Glucose-Capillary: 179 mg/dL — ABNORMAL HIGH (ref 70–99)
Glucose-Capillary: 182 mg/dL — ABNORMAL HIGH (ref 70–99)
Glucose-Capillary: 219 mg/dL — ABNORMAL HIGH (ref 70–99)
Glucose-Capillary: 84 mg/dL (ref 70–99)
Glucose-Capillary: 95 mg/dL (ref 70–99)
Glucose-Capillary: 96 mg/dL (ref 70–99)

## 2010-11-25 LAB — POCT I-STAT 3, ART BLOOD GAS (G3+)
Acid-base deficit: 2 mmol/L (ref 0.0–2.0)
Acid-base deficit: 4 mmol/L — ABNORMAL HIGH (ref 0.0–2.0)
Bicarbonate: 20.9 mEq/L (ref 20.0–24.0)
Bicarbonate: 21.6 mEq/L (ref 20.0–24.0)
O2 Saturation: 100 %
O2 Saturation: 92 %
O2 Saturation: 97 %
O2 Saturation: 99 %
TCO2: 21 mmol/L (ref 0–100)
pCO2 arterial: 33.4 mmHg — ABNORMAL LOW (ref 35.0–45.0)
pCO2 arterial: 34.6 mmHg — ABNORMAL LOW (ref 35.0–45.0)
pCO2 arterial: 39.2 mmHg (ref 35.0–45.0)
pCO2 arterial: 41.8 mmHg (ref 35.0–45.0)
pCO2 arterial: 43 mmHg (ref 35.0–45.0)
pH, Arterial: 7.307 — ABNORMAL LOW (ref 7.350–7.400)
pH, Arterial: 7.323 — ABNORMAL LOW (ref 7.350–7.400)
pH, Arterial: 7.372 (ref 7.350–7.400)
pO2, Arterial: 163 mmHg — ABNORMAL HIGH (ref 80.0–100.0)
pO2, Arterial: 305 mmHg — ABNORMAL HIGH (ref 80.0–100.0)
pO2, Arterial: 365 mmHg — ABNORMAL HIGH (ref 80.0–100.0)
pO2, Arterial: 68 mmHg — ABNORMAL LOW (ref 80.0–100.0)
pO2, Arterial: 79 mmHg — ABNORMAL LOW (ref 80.0–100.0)
pO2, Arterial: 88 mmHg (ref 80.0–100.0)

## 2010-11-25 LAB — POCT I-STAT 3, VENOUS BLOOD GAS (G3P V)
Acid-base deficit: 5 mmol/L — ABNORMAL HIGH (ref 0.0–2.0)
O2 Saturation: 76 %

## 2010-11-25 LAB — BASIC METABOLIC PANEL
BUN: 19 mg/dL (ref 6–23)
BUN: 23 mg/dL (ref 6–23)
BUN: 24 mg/dL — ABNORMAL HIGH (ref 6–23)
BUN: 26 mg/dL — ABNORMAL HIGH (ref 6–23)
BUN: 27 mg/dL — ABNORMAL HIGH (ref 6–23)
BUN: 28 mg/dL — ABNORMAL HIGH (ref 6–23)
BUN: 28 mg/dL — ABNORMAL HIGH (ref 6–23)
BUN: 29 mg/dL — ABNORMAL HIGH (ref 6–23)
CO2: 22 mEq/L (ref 19–32)
CO2: 25 mEq/L (ref 19–32)
CO2: 26 mEq/L (ref 19–32)
CO2: 28 mEq/L (ref 19–32)
CO2: 30 mEq/L (ref 19–32)
CO2: 24 mEq/L (ref 19–32)
Calcium: 8.3 mg/dL — ABNORMAL LOW (ref 8.4–10.5)
Calcium: 8.7 mg/dL (ref 8.4–10.5)
Calcium: 8.7 mg/dL (ref 8.4–10.5)
Calcium: 9 mg/dL (ref 8.4–10.5)
Calcium: 9 mg/dL (ref 8.4–10.5)
Calcium: 9.1 mg/dL (ref 8.4–10.5)
Calcium: 9.6 mg/dL (ref 8.4–10.5)
Chloride: 103 mEq/L (ref 96–112)
Chloride: 108 mEq/L (ref 96–112)
Chloride: 95 mEq/L — ABNORMAL LOW (ref 96–112)
Chloride: 97 mEq/L (ref 96–112)
Chloride: 98 mEq/L (ref 96–112)
Chloride: 98 mEq/L (ref 96–112)
Creatinine, Ser: 1.53 mg/dL — ABNORMAL HIGH (ref 0.4–1.2)
Creatinine, Ser: 1.62 mg/dL — ABNORMAL HIGH (ref 0.4–1.2)
Creatinine, Ser: 1.72 mg/dL — ABNORMAL HIGH (ref 0.4–1.2)
Creatinine, Ser: 1.83 mg/dL — ABNORMAL HIGH (ref 0.4–1.2)
Creatinine, Ser: 1.83 mg/dL — ABNORMAL HIGH (ref 0.4–1.2)
Creatinine, Ser: 1.9 mg/dL — ABNORMAL HIGH (ref 0.4–1.2)
Creatinine, Ser: 1.98 mg/dL — ABNORMAL HIGH (ref 0.4–1.2)
Creatinine, Ser: 1.8 mg/dL — ABNORMAL HIGH (ref 0.4–1.2)
GFR calc Af Amer: 31 mL/min — ABNORMAL LOW (ref >60)
GFR calc Af Amer: 32 mL/min — ABNORMAL LOW (ref >60)
GFR calc Af Amer: 36 mL/min — ABNORMAL LOW (ref >60)
GFR calc Af Amer: 38 mL/min — ABNORMAL LOW (ref >60)
GFR calc Af Amer: 41 mL/min — ABNORMAL LOW (ref >60)
GFR calc non Af Amer: 25 mL/min — ABNORMAL LOW (ref >60)
GFR calc non Af Amer: 26 mL/min — ABNORMAL LOW (ref >60)
GFR calc non Af Amer: 28 mL/min — ABNORMAL LOW (ref >60)
GFR calc non Af Amer: 28 mL/min — ABNORMAL LOW (ref >60)
GFR calc non Af Amer: 30 mL/min — ABNORMAL LOW (ref >60)
GFR calc non Af Amer: 32 mL/min — ABNORMAL LOW (ref >60)
GFR calc non Af Amer: 34 mL/min — ABNORMAL LOW (ref >60)
Glucose, Bld: 102 mg/dL — ABNORMAL HIGH (ref 70–99)
Glucose, Bld: 118 mg/dL — ABNORMAL HIGH (ref 70–99)
Glucose, Bld: 118 mg/dL — ABNORMAL HIGH (ref 70–99)
Glucose, Bld: 124 mg/dL — ABNORMAL HIGH (ref 70–99)
Glucose, Bld: 136 mg/dL — ABNORMAL HIGH (ref 70–99)
Glucose, Bld: 59 mg/dL — ABNORMAL LOW (ref 70–99)
Glucose, Bld: 95 mg/dL (ref 70–99)
Potassium: 3.8 mEq/L (ref 3.5–5.1)
Potassium: 3.9 mEq/L (ref 3.5–5.1)
Potassium: 3.9 mEq/L (ref 3.5–5.1)
Potassium: 4.1 mEq/L (ref 3.5–5.1)
Potassium: 4.2 mEq/L (ref 3.5–5.1)
Potassium: 4.3 mEq/L (ref 3.5–5.1)
Potassium: 4 mEq/L (ref 3.5–5.1)
Sodium: 129 mEq/L — ABNORMAL LOW (ref 135–145)
Sodium: 131 mEq/L — ABNORMAL LOW (ref 135–145)
Sodium: 132 mEq/L — ABNORMAL LOW (ref 135–145)
Sodium: 135 mEq/L (ref 135–145)
Sodium: 137 mEq/L (ref 135–145)

## 2010-11-25 LAB — MAGNESIUM
Magnesium: 2.4 mg/dL (ref 1.5–2.5)
Magnesium: 2.6 mg/dL — ABNORMAL HIGH (ref 1.5–2.5)
Magnesium: 3 mg/dL — ABNORMAL HIGH (ref 1.5–2.5)

## 2010-11-25 LAB — URINALYSIS, ROUTINE W REFLEX MICROSCOPIC
Bilirubin Urine: NEGATIVE
Nitrite: NEGATIVE
Protein, ur: 30 mg/dL — AB
Specific Gravity, Urine: 1.013 (ref 1.005–1.030)
Urobilinogen, UA: 0.2 mg/dL (ref 0.0–1.0)

## 2010-11-25 LAB — HEPARIN LEVEL (UNFRACTIONATED)
Heparin Unfractionated: 0.32 IU/mL (ref 0.30–0.70)
Heparin Unfractionated: 0.57 IU/mL (ref 0.30–0.70)
Heparin Unfractionated: 0.7 IU/mL (ref 0.30–0.70)

## 2010-11-25 LAB — CBC
HCT: 26.4 % — ABNORMAL LOW (ref 36.0–46.0)
HCT: 31.1 % — ABNORMAL LOW (ref 36.0–46.0)
HCT: 31.9 % — ABNORMAL LOW (ref 36.0–46.0)
HCT: 32.1 % — ABNORMAL LOW (ref 36.0–46.0)
HCT: 32.6 % — ABNORMAL LOW (ref 36.0–46.0)
HCT: 32.8 % — ABNORMAL LOW (ref 36.0–46.0)
HCT: 32.9 % — ABNORMAL LOW (ref 36.0–46.0)
HCT: 33.4 % — ABNORMAL LOW (ref 36.0–46.0)
HCT: 35.5 % — ABNORMAL LOW (ref 36.0–46.0)
HCT: 36.3 % (ref 36.0–46.0)
HCT: 36.8 % (ref 36.0–46.0)
HCT: 35.4 % — ABNORMAL LOW (ref 36.0–46.0)
Hemoglobin: 10.9 g/dL — ABNORMAL LOW (ref 12.0–15.0)
Hemoglobin: 11.3 g/dL — ABNORMAL LOW (ref 12.0–15.0)
Hemoglobin: 11.4 g/dL — ABNORMAL LOW (ref 12.0–15.0)
Hemoglobin: 11.4 g/dL — ABNORMAL LOW (ref 12.0–15.0)
Hemoglobin: 11.5 g/dL — ABNORMAL LOW (ref 12.0–15.0)
Hemoglobin: 11.6 g/dL — ABNORMAL LOW (ref 12.0–15.0)
Hemoglobin: 12.4 g/dL (ref 12.0–15.0)
Hemoglobin: 13 g/dL (ref 12.0–15.0)
Hemoglobin: 13 g/dL (ref 12.0–15.0)
Hemoglobin: 9.3 g/dL — ABNORMAL LOW (ref 12.0–15.0)
MCHC: 34.5 g/dL (ref 30.0–36.0)
MCHC: 34.8 g/dL (ref 30.0–36.0)
MCHC: 34.9 g/dL (ref 30.0–36.0)
MCHC: 34.9 g/dL (ref 30.0–36.0)
MCHC: 35.1 g/dL (ref 30.0–36.0)
MCHC: 35.1 g/dL (ref 30.0–36.0)
MCHC: 35.3 g/dL (ref 30.0–36.0)
MCHC: 35.4 g/dL (ref 30.0–36.0)
MCHC: 35.8 g/dL (ref 30.0–36.0)
MCHC: 34 g/dL (ref 30.0–36.0)
MCV: 94.7 fL (ref 78.0–100.0)
MCV: 95.8 fL (ref 78.0–100.0)
MCV: 96.3 fL (ref 78.0–100.0)
MCV: 97 fL (ref 78.0–100.0)
MCV: 97.2 fL (ref 78.0–100.0)
MCV: 97.4 fL (ref 78.0–100.0)
MCV: 97.4 fL (ref 78.0–100.0)
MCV: 97.5 fL (ref 78.0–100.0)
MCV: 101.1 fL — ABNORMAL HIGH (ref 78.0–100.0)
Platelets: 121 10*3/uL — ABNORMAL LOW (ref 150–400)
Platelets: 121 10*3/uL — ABNORMAL LOW (ref 150–400)
Platelets: 123 10*3/uL — ABNORMAL LOW (ref 150–400)
Platelets: 138 10*3/uL — ABNORMAL LOW (ref 150–400)
Platelets: 75 10*3/uL — ABNORMAL LOW (ref 150–400)
Platelets: 81 10*3/uL — ABNORMAL LOW (ref 150–400)
Platelets: 86 10*3/uL — ABNORMAL LOW (ref 150–400)
Platelets: 87 10*3/uL — ABNORMAL LOW (ref 150–400)
Platelets: 88 10*3/uL — ABNORMAL LOW (ref 150–400)
Platelets: 94 10*3/uL — ABNORMAL LOW (ref 150–400)
RBC: 2.62 MIL/uL — ABNORMAL LOW (ref 3.87–5.11)
RBC: 3.07 MIL/uL — ABNORMAL LOW (ref 3.87–5.11)
RBC: 3.35 MIL/uL — ABNORMAL LOW (ref 3.87–5.11)
RBC: 3.35 MIL/uL — ABNORMAL LOW (ref 3.87–5.11)
RBC: 3.37 MIL/uL — ABNORMAL LOW (ref 3.87–5.11)
RBC: 3.42 MIL/uL — ABNORMAL LOW (ref 3.87–5.11)
RBC: 3.43 MIL/uL — ABNORMAL LOW (ref 3.87–5.11)
RBC: 3.64 MIL/uL — ABNORMAL LOW (ref 3.87–5.11)
RBC: 3.74 MIL/uL — ABNORMAL LOW (ref 3.87–5.11)
RBC: 3.88 MIL/uL (ref 3.87–5.11)
RBC: 3.5 MIL/uL — ABNORMAL LOW (ref 3.87–5.11)
RDW: 13.6 % (ref 11.5–15.5)
RDW: 13.7 % (ref 11.5–15.5)
RDW: 15.1 % (ref 11.5–15.5)
RDW: 15.1 % (ref 11.5–15.5)
RDW: 15.4 % (ref 11.5–15.5)
RDW: 15.6 % — ABNORMAL HIGH (ref 11.5–15.5)
RDW: 15.8 % — ABNORMAL HIGH (ref 11.5–15.5)
RDW: 15.9 % — ABNORMAL HIGH (ref 11.5–15.5)
RDW: 16.1 % — ABNORMAL HIGH (ref 11.5–15.5)
RDW: 16.4 % — ABNORMAL HIGH (ref 11.5–15.5)
WBC: 10.5 10*3/uL (ref 4.0–10.5)
WBC: 11.4 10*3/uL — ABNORMAL HIGH (ref 4.0–10.5)
WBC: 11.7 10*3/uL — ABNORMAL HIGH (ref 4.0–10.5)
WBC: 14.5 10*3/uL — ABNORMAL HIGH (ref 4.0–10.5)
WBC: 5.3 10*3/uL (ref 4.0–10.5)
WBC: 6 10*3/uL (ref 4.0–10.5)
WBC: 6.6 10*3/uL (ref 4.0–10.5)
WBC: 7 10*3/uL (ref 4.0–10.5)
WBC: 9.6 10*3/uL (ref 4.0–10.5)
WBC: 9.8 10*3/uL (ref 4.0–10.5)
WBC: 5.6 10*3/uL (ref 4.0–10.5)

## 2010-11-25 LAB — POCT I-STAT 4, (NA,K, GLUC, HGB,HCT)
Glucose, Bld: 101 mg/dL — ABNORMAL HIGH (ref 70–99)
Glucose, Bld: 107 mg/dL — ABNORMAL HIGH (ref 70–99)
Glucose, Bld: 134 mg/dL — ABNORMAL HIGH (ref 70–99)
Glucose, Bld: 158 mg/dL — ABNORMAL HIGH (ref 70–99)
HCT: 24 % — ABNORMAL LOW (ref 36.0–46.0)
HCT: 26 % — ABNORMAL LOW (ref 36.0–46.0)
HCT: 27 % — ABNORMAL LOW (ref 36.0–46.0)
Hemoglobin: 8.2 g/dL — ABNORMAL LOW (ref 12.0–15.0)
Hemoglobin: 9.2 g/dL — ABNORMAL LOW (ref 12.0–15.0)
Hemoglobin: 9.2 g/dL — ABNORMAL LOW (ref 12.0–15.0)
Hemoglobin: 9.9 g/dL — ABNORMAL LOW (ref 12.0–15.0)
Potassium: 3.6 mEq/L (ref 3.5–5.1)
Potassium: 4.4 mEq/L (ref 3.5–5.1)
Potassium: 4.5 mEq/L (ref 3.5–5.1)
Potassium: 4.8 mEq/L (ref 3.5–5.1)
Potassium: 5.9 mEq/L — ABNORMAL HIGH (ref 3.5–5.1)
Sodium: 138 mEq/L (ref 135–145)
Sodium: 139 mEq/L (ref 135–145)
Sodium: 140 mEq/L (ref 135–145)
Sodium: 141 mEq/L (ref 135–145)

## 2010-11-25 LAB — POCT I-STAT, CHEM 8
Calcium, Ion: 1.22 mmol/L (ref 1.12–1.32)
Chloride: 106 mEq/L (ref 96–112)
Chloride: 107 mEq/L (ref 96–112)
HCT: 33 % — ABNORMAL LOW (ref 36.0–46.0)
HCT: 35 % — ABNORMAL LOW (ref 36.0–46.0)
Hemoglobin: 11.2 g/dL — ABNORMAL LOW (ref 12.0–15.0)
Hemoglobin: 11.9 g/dL — ABNORMAL LOW (ref 12.0–15.0)
Potassium: 4.5 mEq/L (ref 3.5–5.1)
Potassium: 4.9 mEq/L (ref 3.5–5.1)
Sodium: 137 mEq/L (ref 135–145)

## 2010-11-25 LAB — PREPARE PLATELETS

## 2010-11-25 LAB — CROSSMATCH
ABO/RH(D): O NEG
Antibody Screen: NEGATIVE

## 2010-11-25 LAB — PLATELET COUNT: Platelets: 56 10*3/uL — ABNORMAL LOW (ref 150–400)

## 2010-11-25 LAB — URINE MICROSCOPIC-ADD ON

## 2010-11-25 LAB — CREATININE, SERUM
Creatinine, Ser: 1.35 mg/dL — ABNORMAL HIGH (ref 0.4–1.2)
Creatinine, Ser: 1.81 mg/dL — ABNORMAL HIGH (ref 0.4–1.2)
GFR calc Af Amer: 34 mL/min — ABNORMAL LOW (ref >60)
GFR calc Af Amer: 47 mL/min — ABNORMAL LOW (ref >60)
GFR calc non Af Amer: 28 mL/min — ABNORMAL LOW (ref >60)
GFR calc non Af Amer: 39 mL/min — ABNORMAL LOW (ref >60)

## 2010-11-25 LAB — BRAIN NATRIURETIC PEPTIDE: Pro B Natriuretic peptide (BNP): 89 pg/mL (ref 0.0–100.0)

## 2010-11-25 LAB — PROTIME-INR
INR: 1.4 (ref 0.00–1.49)
Prothrombin Time: 17.5 seconds — ABNORMAL HIGH (ref 11.6–15.2)

## 2010-11-25 LAB — HEMOGLOBIN AND HEMATOCRIT, BLOOD: HCT: 26.7 % — ABNORMAL LOW (ref 36.0–46.0)

## 2010-11-25 LAB — APTT: aPTT: 37 seconds (ref 24–37)

## 2010-11-25 LAB — ABO/RH: ABO/RH(D): O NEG

## 2010-11-30 LAB — BLOOD GAS, ARTERIAL
Acid-base deficit: 2.7 mmol/L — ABNORMAL HIGH (ref 0.0–2.0)
Bicarbonate: 22.2 mEq/L (ref 20.0–24.0)
Drawn by: 155271
FIO2: 1 %
O2 Saturation: 97.1 %
O2 Saturation: 98.4 %
Patient temperature: 98.6
TCO2: 20.8 mmol/L (ref 0–100)
pO2, Arterial: 145 mmHg — ABNORMAL HIGH (ref 80.0–100.0)
pO2, Arterial: 90.5 mmHg (ref 80.0–100.0)

## 2010-11-30 LAB — CBC
HCT: 31.4 % — ABNORMAL LOW (ref 36.0–46.0)
HCT: 39.4 % (ref 36.0–46.0)
Hemoglobin: 12.5 g/dL (ref 12.0–15.0)
Hemoglobin: 13.1 g/dL (ref 12.0–15.0)
Hemoglobin: 13.6 g/dL (ref 12.0–15.0)
MCHC: 33.8 g/dL (ref 30.0–36.0)
MCHC: 34 g/dL (ref 30.0–36.0)
MCHC: 34.2 g/dL (ref 30.0–36.0)
MCHC: 34.4 g/dL (ref 30.0–36.0)
MCHC: 34.5 g/dL (ref 30.0–36.0)
MCV: 102.4 fL — ABNORMAL HIGH (ref 78.0–100.0)
Platelets: 103 10*3/uL — ABNORMAL LOW (ref 150–400)
Platelets: 93 10*3/uL — ABNORMAL LOW (ref 150–400)
RBC: 3.07 MIL/uL — ABNORMAL LOW (ref 3.87–5.11)
RBC: 3.58 MIL/uL — ABNORMAL LOW (ref 3.87–5.11)
RBC: 3.68 MIL/uL — ABNORMAL LOW (ref 3.87–5.11)
RBC: 3.78 MIL/uL — ABNORMAL LOW (ref 3.87–5.11)
RBC: 3.92 MIL/uL (ref 3.87–5.11)
RDW: 14.2 % (ref 11.5–15.5)
RDW: 14.3 % (ref 11.5–15.5)
RDW: 14.7 % (ref 11.5–15.5)
WBC: 4.9 10*3/uL (ref 4.0–10.5)
WBC: 5.2 10*3/uL (ref 4.0–10.5)
WBC: 7.8 10*3/uL (ref 4.0–10.5)

## 2010-11-30 LAB — GLUCOSE, CAPILLARY
Glucose-Capillary: 141 mg/dL — ABNORMAL HIGH (ref 70–99)
Glucose-Capillary: 159 mg/dL — ABNORMAL HIGH (ref 70–99)
Glucose-Capillary: 162 mg/dL — ABNORMAL HIGH (ref 70–99)
Glucose-Capillary: 172 mg/dL — ABNORMAL HIGH (ref 70–99)
Glucose-Capillary: 174 mg/dL — ABNORMAL HIGH (ref 70–99)
Glucose-Capillary: 178 mg/dL — ABNORMAL HIGH (ref 70–99)
Glucose-Capillary: 191 mg/dL — ABNORMAL HIGH (ref 70–99)
Glucose-Capillary: 199 mg/dL — ABNORMAL HIGH (ref 70–99)
Glucose-Capillary: 209 mg/dL — ABNORMAL HIGH (ref 70–99)
Glucose-Capillary: 256 mg/dL — ABNORMAL HIGH (ref 70–99)
Glucose-Capillary: 280 mg/dL — ABNORMAL HIGH (ref 70–99)
Glucose-Capillary: 329 mg/dL — ABNORMAL HIGH (ref 70–99)

## 2010-11-30 LAB — CARDIAC PANEL(CRET KIN+CKTOT+MB+TROPI)
CK, MB: 11.5 ng/mL — ABNORMAL HIGH (ref 0.3–4.0)
CK, MB: 8.5 ng/mL — ABNORMAL HIGH (ref 0.3–4.0)
Relative Index: INVALID (ref 0.0–2.5)
Troponin I: 0.8 ng/mL (ref 0.00–0.06)
Troponin I: 1.24 ng/mL (ref 0.00–0.06)

## 2010-11-30 LAB — DIFFERENTIAL
Basophils Absolute: 0 10*3/uL (ref 0.0–0.1)
Basophils Relative: 0 % (ref 0–1)
Basophils Relative: 0 % (ref 0–1)
Eosinophils Absolute: 0.1 10*3/uL (ref 0.0–0.7)
Eosinophils Relative: 0 % (ref 0–5)
Eosinophils Relative: 1 % (ref 0–5)
Lymphs Abs: 0.8 10*3/uL (ref 0.7–4.0)
Monocytes Absolute: 0.6 10*3/uL (ref 0.1–1.0)
Monocytes Relative: 9 % (ref 3–12)
Neutrophils Relative %: 79 % — ABNORMAL HIGH (ref 43–77)

## 2010-11-30 LAB — LIPID PANEL: Cholesterol: 152 mg/dL (ref 0–200)

## 2010-11-30 LAB — RENAL FUNCTION PANEL
Calcium: 9.4 mg/dL (ref 8.4–10.5)
GFR calc Af Amer: 31 mL/min — ABNORMAL LOW (ref >60)
GFR calc non Af Amer: 25 mL/min — ABNORMAL LOW (ref >60)
Glucose, Bld: 134 mg/dL — ABNORMAL HIGH (ref 70–99)
Phosphorus: 6.4 mg/dL — ABNORMAL HIGH (ref 2.3–4.6)
Potassium: 4.2 mEq/L (ref 3.5–5.1)
Sodium: 133 mEq/L — ABNORMAL LOW (ref 135–145)

## 2010-11-30 LAB — APTT
aPTT: 30 seconds (ref 24–37)
aPTT: 76 seconds — ABNORMAL HIGH (ref 24–37)

## 2010-11-30 LAB — POCT I-STAT, CHEM 8
BUN: 18 mg/dL (ref 6–23)
Calcium, Ion: 1.16 mmol/L (ref 1.12–1.32)
Creatinine, Ser: 1.4 mg/dL — ABNORMAL HIGH (ref 0.4–1.2)
Glucose, Bld: 196 mg/dL — ABNORMAL HIGH (ref 70–99)
Hemoglobin: 11.2 g/dL — ABNORMAL LOW (ref 12.0–15.0)
TCO2: 23 mmol/L (ref 0–100)

## 2010-11-30 LAB — BASIC METABOLIC PANEL
BUN: 22 mg/dL (ref 6–23)
BUN: 25 mg/dL — ABNORMAL HIGH (ref 6–23)
CO2: 24 mEq/L (ref 19–32)
CO2: 28 mEq/L (ref 19–32)
CO2: 29 mEq/L (ref 19–32)
CO2: 30 mEq/L (ref 19–32)
Calcium: 9.5 mg/dL (ref 8.4–10.5)
Calcium: 9.7 mg/dL (ref 8.4–10.5)
Chloride: 94 mEq/L — ABNORMAL LOW (ref 96–112)
Creatinine, Ser: 1.52 mg/dL — ABNORMAL HIGH (ref 0.4–1.2)
Creatinine, Ser: 1.76 mg/dL — ABNORMAL HIGH (ref 0.4–1.2)
GFR calc Af Amer: 29 mL/min — ABNORMAL LOW (ref >60)
GFR calc Af Amer: 35 mL/min — ABNORMAL LOW (ref >60)
GFR calc Af Amer: 41 mL/min — ABNORMAL LOW (ref >60)
GFR calc non Af Amer: 29 mL/min — ABNORMAL LOW (ref >60)
Glucose, Bld: 166 mg/dL — ABNORMAL HIGH (ref 70–99)
Glucose, Bld: 182 mg/dL — ABNORMAL HIGH (ref 70–99)
Glucose, Bld: 203 mg/dL — ABNORMAL HIGH (ref 70–99)
Potassium: 4.9 mEq/L (ref 3.5–5.1)
Potassium: 5.2 mEq/L — ABNORMAL HIGH (ref 3.5–5.1)
Sodium: 130 mEq/L — ABNORMAL LOW (ref 135–145)
Sodium: 133 mEq/L — ABNORMAL LOW (ref 135–145)
Sodium: 136 mEq/L (ref 135–145)

## 2010-11-30 LAB — COMPREHENSIVE METABOLIC PANEL
ALT: 23 U/L (ref 0–35)
AST: 30 U/L (ref 0–37)
Albumin: 3.3 g/dL — ABNORMAL LOW (ref 3.5–5.2)
Alkaline Phosphatase: 65 U/L (ref 39–117)
Chloride: 100 mEq/L (ref 96–112)
GFR calc Af Amer: 42 mL/min — ABNORMAL LOW (ref >60)
Potassium: 3.7 mEq/L (ref 3.5–5.1)
Total Bilirubin: 0.6 mg/dL (ref 0.3–1.2)

## 2010-11-30 LAB — POCT CARDIAC MARKERS
CKMB, poc: 3.1 ng/mL (ref 1.0–8.0)
Myoglobin, poc: 108 ng/mL (ref 12–200)
Myoglobin, poc: 88.1 ng/mL (ref 12–200)
Troponin i, poc: <0.05 ng/mL (ref 0.00–0.09)

## 2010-11-30 LAB — CROSSMATCH

## 2010-11-30 LAB — HEPARIN LEVEL (UNFRACTIONATED)
Heparin Unfractionated: 0.26 IU/mL — ABNORMAL LOW (ref 0.30–0.70)
Heparin Unfractionated: 0.28 IU/mL — ABNORMAL LOW (ref 0.30–0.70)
Heparin Unfractionated: 0.34 IU/mL (ref 0.30–0.70)
Heparin Unfractionated: 0.37 IU/mL (ref 0.30–0.70)
Heparin Unfractionated: 0.52 IU/mL (ref 0.30–0.70)

## 2010-11-30 LAB — TROPONIN I: Troponin I: 1.22 ng/mL (ref 0.00–0.06)

## 2010-11-30 LAB — PROTIME-INR: INR: 1.1 (ref 0.00–1.49)

## 2010-11-30 LAB — CULTURE, BLOOD (ROUTINE X 2): Culture: NO GROWTH

## 2010-11-30 LAB — VITAMIN B12: Vitamin B-12: 204 pg/mL — ABNORMAL LOW (ref 211–911)

## 2010-11-30 LAB — IRON AND TIBC
Iron: 38 ug/dL — ABNORMAL LOW (ref 42–135)
TIBC: 317 ug/dL (ref 250–470)

## 2010-11-30 LAB — ABO/RH: ABO/RH(D): O NEG

## 2010-11-30 LAB — PHOSPHORUS: Phosphorus: 5.9 mg/dL — ABNORMAL HIGH (ref 2.3–4.6)

## 2010-11-30 LAB — FOLATE RBC: RBC Folate: 803 ng/mL — ABNORMAL HIGH (ref 180–600)

## 2010-12-22 ENCOUNTER — Other Ambulatory Visit: Payer: Self-pay | Admitting: Oncology

## 2010-12-22 ENCOUNTER — Encounter (HOSPITAL_BASED_OUTPATIENT_CLINIC_OR_DEPARTMENT_OTHER): Payer: Medicare Other | Admitting: Oncology

## 2010-12-22 DIAGNOSIS — N189 Chronic kidney disease, unspecified: Secondary | ICD-10-CM

## 2010-12-22 DIAGNOSIS — D631 Anemia in chronic kidney disease: Secondary | ICD-10-CM

## 2010-12-22 DIAGNOSIS — D649 Anemia, unspecified: Secondary | ICD-10-CM

## 2010-12-22 DIAGNOSIS — D696 Thrombocytopenia, unspecified: Secondary | ICD-10-CM

## 2010-12-22 DIAGNOSIS — N289 Disorder of kidney and ureter, unspecified: Secondary | ICD-10-CM

## 2010-12-22 LAB — CBC WITH DIFFERENTIAL/PLATELET
EOS%: 4.4 % (ref 0.0–7.0)
Eosinophils Absolute: 0.3 10*3/uL (ref 0.0–0.5)
MCH: 34.7 pg — ABNORMAL HIGH (ref 25.1–34.0)
MCV: 101.3 fL — ABNORMAL HIGH (ref 79.5–101.0)
MONO%: 12.7 % (ref 0.0–14.0)
NEUT#: 3.9 10*3/uL (ref 1.5–6.5)
RBC: 2.83 10*6/uL — ABNORMAL LOW (ref 3.70–5.45)
RDW: 13.4 % (ref 11.2–14.5)
lymph#: 1.2 10*3/uL (ref 0.9–3.3)

## 2010-12-22 LAB — COMPREHENSIVE METABOLIC PANEL
ALT: 13 U/L (ref 0–35)
Albumin: 4.2 g/dL (ref 3.5–5.2)
Alkaline Phosphatase: 77 U/L (ref 39–117)
Potassium: 4.4 mEq/L (ref 3.5–5.3)
Sodium: 129 mEq/L — ABNORMAL LOW (ref 135–145)
Total Bilirubin: 0.4 mg/dL (ref 0.3–1.2)
Total Protein: 6.6 g/dL (ref 6.0–8.3)

## 2010-12-28 NOTE — Op Note (Signed)
[name], [name] NO.:  [id number]   MEDICAL RECORD NO.:  [id number]          PATIENT TYPE:  INP   LOCATION:  2314                         FACILITY:  [facility]   PHYSICIAN:  Ivin Poot, M.D.  DATE OF BIRTH:  [birth year]   DATE OF PROCEDURE:  10/16/2008  DATE OF DISCHARGE:                               OPERATIVE REPORT   OPERATIONS:  1. Coronary artery bypass grafting x3 (left internal mammary artery to      left anterior descending, saphenous vein graft to right coronary      artery, saphenous vein graft to circumflex marginal).  2. Endoscopic harvest of right leg greater saphenous vein.   SURGEON:  Ivin Poot, MD   ASSISTANT:  John Giovanni, PA-C   ANESTHESIA:  General.   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   INDICATIONS:  The patient is a 70 year old diabetic who presented with  anemia, dyspnea on exertion, chest discomfort, and an elevated  creatinine.  She also had pulmonary edema.  After she was medically  stabilized, she underwent cardiac catheterization with a creatinine at  that time being 1.9.  This demonstrated severe multivessel coronary  artery disease with 99% stenosis of the RCA, 95% stenosis of the LAD,  and a 50% stenosis of the circumflex marginal.  Her overall LV was  fairly well preserved and she was felt to be a candidate for surgical  revascularization.  I reviewed the results of the cardiac cath with the  patient and her family and discussed the indications and expected  benefits of multivessel coronary artery bypass grafting for treatment of  her severe coronary artery disease.  I discussed the options to this  problem as well.  I reviewed with her the specific details of surgery  including the choice of conduit, the location of the surgical incisions,  the  use of general anesthesia, and cardiopulmonary bypass, and expected  postoperative recovery.  I reviewed with the patient the risks including  the risks of heart attack, stroke, bleeding, blood transfusion  requirement, infection, and death.  She understood that with her  baseline anemia and baseline thrombocytopenia that she would be at  increased risk for requiring blood transfusion therapy.  She also  understood the risks of further renal impairment, the potential  requirement for dialysis postoperatively, and the risk of infection due  to her diabetes and obesity.  After reviewing all of these issues, she  demonstrated her understanding and agreed to proceed with the surgery  under what I felt was an informed consent.   PROCEDURE:  The patient was brought to the operating room and placed  supine on the operating room table.  General anesthesia was induced.  The chest, abdomen, and legs were prepped with Betadine and draped as a  sterile field.  A sternal incision was made as the saphenous vein was  harvested endoscopically from the right leg.  The left internal mammary  artery  was harvested as a pedicle graft from its origin at the  subclavian vessels.  It was a good vessel with excellent flow.  The  sternal retractor was placed and the pericardium was opened and  suspended.  Heparin was administered and pursestrings were placed in the  ascending aorta and right atrium.  The patient was cannulated and placed  on bypass after the ACT was documented as being therapeutic.  The  coronaries were identified for grafting and the mammary artery and vein  grafts were prepared for the distal anastomoses.  Cardioplegia catheters  were placed for both antegrade and retrograde cold blood cardioplegia.  The patient was cooled to 32 degrees and aortic cross-clamp was applied.  An 800 mL of cold blood cardioplegia was delivered in split doses  between the antegrade aortic and retrograde coronary  sinus catheters.  There was good cardioplegic arrest and septal temperature dropped less  than 12 degrees.  Cardioplegia was then delivered every 20 minutes or  less while the cross-clamp was in place.   The distal coronary anastomoses were then performed.  The first distal  anastomosis was to the distal RCA.  This was a 1.5-mm vessel with  proximal tight 99% stenosis.  Reverse saphenous vein was sewn end-to-  side with running 7-0 Prolene with good flow through the graft.  The  second distal anastomosis was to the circumflex marginal.  This was a  1.5-mm vessel with proximal 50-60% stenosis.  A reverse saphenous vein  was sewn end-to-side with running 7-0 Prolene with good flow through the  graft.  The third distal anastomosis was to the distal third of the LAD.  This was a 1.5-mm vessel with a proximal 95% stenosis.  The left IMA  pedicle was brought through an opening created in the left lateral  pericardium and was brought down onto the LAD and sewn end-to-side with  running 8-0 Prolene.  There was good flow through the anastomosis after  briefly releasing the pedicle bulldog on the mammary artery.  The  mammary pedicle was secured to the epicardium and cardioplegia was  redosed.   While the cross-clamp was still in place, 2 proximal vein anastomoses  were performed using a 4.0-mm punch running 7-0 Prolene.  Prior to tying  down the final proximal anastomosis, air was vented from the coronaries  with a dose of retrograde warm blood cardioplegia.  The final proximal  anastomosis was tied and the cross-clamp was removed.   The heart resumed a spontaneous rhythm.  Air was aspirated from the vein  grafts with a 27-gauge needle.  The cardioplegia cannulas were removed.  Proximal and distal anastomoses were checked and found to be hemostatic.  Temporary pacing wires were applied.  The lungs re-expanded and the  ventilator was resumed after the patient was rewarmed to 37 degrees.  The  patient was weaned off bypass without difficulty.  Cardiac output  and blood pressure were stable.  Protamine was administered without  adverse reaction.  A platelet count at the termination of bypass was  only 60,000, so the patient was given a unit of platelets and a unit of  FFP to help with coagulation function.  The patient required 4 units of  packed cells during the course of the operation to maintain a hemoglobin  over 8 g.  The patient remained hemodynamically stable off bypass.  After the protamine and platelets were administered, the coagulation  function improved.  The mediastinum was irrigated with warm antibiotic  irrigation.  The leg incision was irrigated and closed in a standard  fashion.  The superior pericardial fat was closed over the aorta.  Two  mediastinal and a left pleural chest tube were placed and brought out  through separate incisions.  The sternum was closed with interrupted  steel wire.  The pectoralis fascia was closed with a running #1 Vicryl.  The subcutaneous and skin layers were closed in running Vicryl and  sterile dressings were applied.  Total bypass time was 100 minutes.      Ivin Poot, M.D.  Electronically Signed     PV/MEDQ  D:  10/16/2008  T:  10/17/2008  Job:  [id number]   cc:   Loralie Champagne, MD

## 2010-12-28 NOTE — Discharge Summary (Signed)
NAMEOCEANA, CUSH NO.:  [id number]   MEDICAL RECORD NO.:  [id number]          PATIENT TYPE:  INP   LOCATION:  2017                         FACILITY:  [facility]   PHYSICIAN:  Ivin Poot, M.D.  DATE OF BIRTH:  May 01, 1941   DATE OF ADMISSION:  10/13/2008  DATE OF DISCHARGE:                               DISCHARGE SUMMARY   HISTORY:  The patient is a 69 year old female with a history of  hypertension and diabetes mellitus who presents to the emergency room  with complaints of shortness of breath that started early in the a.m. on  the day of admission.  The patient reportedly felt as though she could  not catch her breath.  The patient's husband reported that she appeared  to be having some wheezing at that time.  She was brought to the  emergency department and was given nebulizers with some improvement in  her symptoms.  She also had some symptoms of chest pressure.  She denied  palpitations, dizziness, diaphoresis, loss of consciousness, or any  focal weakness of any body part.  She denied cough, fever, abdominal  pain, nausea, vomiting, diarrhea as well.  The patient had a chest x-  ray, which reveals some interstitial edema, superimposed with chronic  lung disease as well as small effusions.  CT scan of the chest showed  bilateral intraseptal thickening suggesting pulmonary interstitial edema  as well as right pleural effusion and consolidation of the right lung  suggestive of an active pneumonia.  The patient was felt to require  admission for further evaluation and treatment.  Due to the patient's  findings suggestive of possibility of a component of congestive heart  failure, she was given some diuresis.  Her enzymes were shown to have  some slight elevation.  Findings were consistent with non-ST-segment  elevation myocardial infarction type 2 with demand ischemia and symptoms  consistent with acute diastolic heart failure.  A Cardiology  consultation was  obtained with Dr. Aundra Dubin who felt the patient had a  very high risk obstructive coronary artery disease with positive cardiac  markers in the setting of a diabetic of greater than 15 years.  Cardiac  catheterization was recommended for definitive evaluation.  The patient  was stabilized medically including ongoing diuresis and antibiotics.  The patient was noted to have an acute renal insufficiency with a  creatinine to a peak of 2.1.  This was improved with holding further  diuresis as well as holding her ACE inhibitors as well as holding her  ARB.  The patient was placed on heparin.  Cardiac catheterization was  then felt to be able to be done and was performed on October 13, 2008.  Findings were consistent with significant coronary artery disease.  This  was felt to be severe multivessel with a 99% stenosis of the right  coronary artery, a 95% stenosis of the LAD, and a 50% stenosis of the  circumflex marginal.  Her left ventricular function was well preserved.  Due to the significant findings, Surgical consultation was then obtained  with Ivin Poot, MD,  who evaluated the patient and studies, and  agreed with recommendations to proceed with surgical revascularization.  The patient was medically stable and on October 16, 2008, was taken to the  operating room and underwent the following procedure, coronary artery  bypass grafting x3, the following grafts were placed;  1. Left internal mammary artery to the LAD.  2. Saphenous vein graft to the right coronary artery.  3. Saphenous vein graft to the obtuse marginal.  The patient tolerated      the procedure well, was taken to the surgical intensive care unit      in stable condition.   POSTOPERATIVE HOSPITAL COURSE:  The patient has overall done well.  She  was extubated without difficulty.  She has remained neurologically  intact.  She does have a moderate volume overload and has been diuresed.  Her creatinine has stabilized.  Her most  recent BUN and creatinine dated  October 20, 2008, are 27 and 1.72 respectively.  Most recent hemoglobin and  hematocrit dated October 20, 2008, are 13 and 36 respectively.  All routine  lines, monitors, and drainage devices have been discontinued in the  standard fashion.  The patient did have a postoperative  thrombocytopenia, this has improved with time.  HIT screen is negative.  Her incisions are all healing well without evidence of infection.  She  is tolerating routine advancement in activities using standard  protocols.  Oxygen has been weaning and she maintains adequate  saturations on room air.  Overall, her status is felt to be tentatively  stable for discharge in the morning of October 21, 2008, pending morning  round reevaluation.   INSTRUCTIONS:  The patient received written instructions in regard to  medications, activity, diet, wound care, and followup.   FOLLOWUP:  Dr. Aundra Dubin, 2 weeks postdischarge, and Dr. Prescott Gum on November 10, 2008, at 01:15 p.m.   MEDICATIONS ON DISCHARGE:  At the time of this dictation include;  1. Amaryl 4 mg daily.  2. Zocor 40 mg daily.  3. Nexium 40 mg daily.  4. Alprazolam 0.5 mg 1-2 twice daily as preoperatively.  5. Betimol 0.5% to both eyes twice daily.  6. Aspirin 81 mg daily.  7. Lopressor 25 mg twice daily.  8. Multivitamin 1 daily.  9. Benicar 10 mg daily.  10.Ultram 50 mg 1-2 every 6 hours as needed for pain.  11.Lasix 40 mg daily for 7 days.  12.K-Dur 20 mEq daily for 7 days.   No decisions were made at the time of discharge as to Lantus use versus  restarting metformin.   FINAL DIAGNOSES:  1. Severe multi-vessel coronary artery disease with a subendocardial      myocardial infarction on presentation.  2. Possible pneumonia on presentation.  3. Diastolic congestive heart failure on presentation.  4. History of non-insulin-dependent diabetes mellitus.  5. History of hypertension.  6. History of chronic renal insufficiency.  7.  History of anxiety disorder.  8. History of chronic low back pain status post lumbar laminectomy.  9. History of hyperlipidemia.  10.History of insomnia.  11.History of multiple previous back surgeries.  12.History of gastroesophageal reflux.  13.History of anemia and thrombocytopenia.     John Giovanni, P.A.-C.      Ivin Poot, M.D.  Electronically Signed   WEG/MEDQ  D:  10/20/2008  T:  10/21/2008  Job:  [id number]   cc:   Loralie Champagne, MD

## 2010-12-28 NOTE — Cardiovascular Report (Signed)
NAMEKATHLINE, FOWBLE NO.:  [id number]   MEDICAL RECORD NO.:  [id number]          PATIENT TYPE:  INP   LOCATION:  2502                         FACILITY:  [facility]   PHYSICIAN:  Loralie Champagne, MD      DATE OF BIRTH:  04-25-1941   DATE OF PROCEDURE:  DATE OF DISCHARGE:                            CARDIAC CATHETERIZATION   PROCEDURE:  1. Left heart catheterization.  2. Coronary angiography.  3. Subclavian angiography.   INDICATIONS:  This is a 70 year old patient with history of diabetes and  hypertension who presented to [facility] with severe shortness  of breath and was noted to have a diastolic congestive heart failure  exacerbation as well as a probable pneumonia.  She is also noted to have  elevated cardiac enzymes.  She was diuresed, was treated for pneumonia,  and now comes here today for left heart catheterization to define her  coronary anatomy.   PROCEDURE:  After informed consent was obtained, the right and left  groins were sterilely prepped and draped.  Lidocaine 1% was used to  locally anesthetize the right and left groin areas.  Initially, the  right common femoral vein was entered using Seldinger technique and a 7-  French venous sheath was placed.  The right common femoral artery was  engaged using Seldinger technique.  Flow was pulsatile.  We were unable  to pass the wire beyond the proximal right common iliac artery.  Therefore, we did change sites to the left groin and there engaged the  left common femoral artery using Seldinger technique.  We did encounter  some resistance to passage of the wire from the proximal left common  iliac artery into the distal aorta, however, we were able to pass the  wire eventually and then able to conduct the left heart catheterization  from that side.  The left coronary artery was engaged using the 5-French  JL-3.5 catheter.  The right coronary artery was engaged using the 5-  [place] JR-4 catheter and  the left subclavian was engaged using the 5-  [place] JR-4 catheter.  The left ventricle was entered using the 5-French  angled pigtail catheter.  The right heart catheterization was carried  out using a ballon tipped Swan-Ganz catheter.  Samples were removed for  saturation reads from the pulmonary artery and the femoral artery.   FINDINGS:  1. Hemodynamics:  Aorta 121/64, LV 146/5/9, mean right atrial pressure      6 mmHg, RV 28/7, PA 29/12 mean 22, mean pulmonary capillary wedge      pressure 12 mmHg.  Mixed venous O2 saturation 76%.  Aortic      saturation 92%.  Cardiac output 9.6 liters per minute.  Cardiac      index 5.1.  2. Subclavian angiography showed the LIMA is patent.  3. Coronary angiography.  This showed a dominant right coronary      artery.  There was a 99% proximal RCA stenosis with TIMI II flow in      the RCA beyond the stenosis.  There were, of note, left-to-right  collaterals from the LAD system.  The left main coronary artery was      patent.  The circumflex system was patent.  There was a small AV      circumflex and there was a large first obtuse marginal.  There was      an 80% calcified stenosis in the proximal LAD after the first      diagonal.  The remainder of the LAD had mild luminal      irregularities.   Given the patient's chronic kidney disease and her recent acute renal  failure and her creatinine of 1.8, we did not further define her iliac  anatomy to avoid further contrast use.  This will need to be  investigated in the future.   ASSESSMENT/PLAN:  This is a 70 year old with a history of type 2  diabetes and hypertension who presented with right lower lobe pneumonia  and diastolic CHF exacerbation and was noted to have elevated cardiac  enzymes consistent with a non-ST-elevation MI.  The patient also does  have a history of chronic kidney disease.  The patient's creatinine rose  as high as 2.1 in the hospital.  She was diuresed and has been  treated  for pneumonia for greater than 7 days. Right heart catheterization today  shows well-compensated right and left heart filling pressures and good  cardiac index.  Left heart catheterization showed a severe 99% proximal  RCA stenosis with TIMI II in the distal vessel.  This lesion is the  likely culprit for her non-ST-elevation MI.  The patient also has an 80%  proximal LAD stenosis.  There is significant calcium in the coronaries.  We did review the films.  Given the fact the patient has calcified  vessels and renal failure, she would certainly need 2 separate  procedures with rotablation likely if we did percutaneous coronary  intervention and her creatinine which is 1.8 today would certainly be a  factor.  In this diabetic patient, I think bypass surgery may be the  best option.  She does have preserved LV systolic function.  We will  obtain a CT surgery consult.  We will order carotid Dopplers.  We will  resume her heparin today given the very tight RCA stenosis and 6 hours  after her sheath removal if her groins remained stable.  We will also  gently hydrate her as her filling pressures were not elevated.      Loralie Champagne, MD  Electronically Signed     DM/MEDQ  D:  10/13/2008  T:  10/13/2008  Job:  [id number]

## 2010-12-28 NOTE — Consult Note (Signed)
[name], [name]                 ACCOUNT NO.:  [id number]   MEDICAL RECORD NO.:  [id number]          PATIENT TYPE:  INP   LOCATION:  [id number]                         FACILITY:  [facility]   PHYSICIAN:  Juanda Bond. Burt Knack, MD  DATE OF BIRTH:  1940-11-27   DATE OF CONSULTATION:  10/08/2008  DATE OF DISCHARGE:                                 CONSULTATION   REASON FOR CONSULTATION:  Congestive heart failure and abnormal cardiac  markers.   HISTORY OF PRESENT ILLNESS:  [name] is a delightful 70 year old woman  with longstanding diabetes and hypertension.  She presented with a 2-  week history of progressive shortness of breath and ultimately developed  respiratory distress.  Upon arrival to the to the [facility] Emergency  Department she was treated with IV Solu-Medrol.  She has had objective  findings of both pneumonia and congestive heart failure.  A CT angiogram  of the chest was performed to rule out pulmonary embolism and this  demonstrated a right pleural effusion, right lower lobe density.  The  findings were suggestive of pulmonary interstitial edema as well as  active pneumonia in the right lung base.  The patient's BNP was also  elevated, suggestive of congestive heart failure.  She underwent an  echocardiogram that demonstrated normal left ventricular systolic  function with Doppler parameters consistent with high LV filling  pressures.   From a symptomatic standpoint, she reports 2 weeks of progressive  dyspnea with nonproductive cough.  This occurred without fever, chills,  chest pain or myalgias.  She described mild orthopnea and occasions of  PND.  She denies edema.  She has no history of similar problems.  She  leads a fairly sedentary lifestyle due to chronic back pain.  She has  had no symptoms with exertion.  She denies lightheadedness, syncope,  palpitations or any history of cardiac problems.   PAST MEDICAL HISTORY:  1. Type 2 diabetes, treated with oral  hypoglycemics.  Sequelae include      peripheral neuropathy.  2. Essential hypertension.  3. Hyperlipidemia.  4. Anxiety.  5. Chronic low back pain.   PAST SURGICAL HISTORY:  Multiple back surgeries.   HOME MEDICATIONS:  1. Diovan/hydrochlorothiazide 160/12.5 mg daily.  2. Amaryl 4 mg daily.  3. Toprol XL 100 mg daily.  4. Zocor 40 mg q.h.s.  5. Lotrel 10/20 mg daily.  6. Nexium 40 mg daily.  7. metformin 1 gram twice daily.  8. Alprazolam 0.5 mg b.i.d.   ALLERGIES:  NKDA.   FAMILY HISTORY:  The patient was adopted.  Her of biological family  history is unknown.   SOCIAL HISTORY:  The patient is married.  She is retired from [employer]  where she worked for 30 years.  She is a former smoker but quit 20 years  ago.  She does not use alcohol or drugs.  She has 1 grown son.   REVIEW OF SYSTEMS:  A complete 12 point review of systems was performed.  The only pertinent positive included generalized fatigue.  All other  systems were reviewed and are negative except  as outlined as above.   PHYSICAL EXAM:  CONSTITUTIONAL:  The patient is alert and oriented.  She  is in no acute distress.  VITAL SIGNS:  Blood pressure is 147/82, temperature is 98.1, heart rate  is 90, respiratory rates 18.  HEENT:  Normal.  NECK:  Normal carotid upstrokes.  No bruits.  JVP normal.  No  thyromegaly or thyroid nodules.  LUNGS:  Clear to auscultation bilaterally.  HEART:  The apex is discreet, nondisplaced.  There is no right  ventricular heave or lift.  The heart is regular rate and rhythm with  grade 2/6 systolic ejection murmur along left sternal border.  ABDOMEN:  Soft, nontender, no organomegaly, no abdominal bruits.  BACK:  No CVA tenderness.  EXTREMITIES:  No clubbing, cyanosis or edema.  Peripheral pulses are 2+  and equal.  There are no femoral artery bruits.  SKIN:  Warm and dry without rash.  LYMPHATICS:  No adenopathy.  NEUROLOGIC:  Cranial nerves II-XII are intact.  Strength intact  and  equal bilaterally.   EKG shows normal sinus rhythm with inferolateral ST changes, consider  inferolateral ischemia.   Echocardiogram showed normal LV function, LVEF 55-65%, Doppler  parameters consistent with high LV filling pressure.  CT angio the chest  with results as reported above.  Chest x-ray showed interstitial edema  superimposed on chronic lung disease with small bilateral effusions.   Lab data shows an elevated BNP of 494.  Lipid panel with cholesterol  152, triglycerides 215, HDL 39 LDL 70, hemoglobin A1c is 6.2.  TSH is  normal at 0.8.  Troponin is 1.1, CK-MB is 8.5 with a total CK of 83,  creatinine is 1.5, potassium is 3.7.  Sodium is 135.   ASSESSMENT:  1. Non-ST elevation myocardial infarction, likely type 2 (secondary to      demand ischemia).  2. Acute diastolic heart failure, improved with intravenous diuresis.  3. Type 2 diabetes.  4. Chronic kidney disease, stage III, with creatinine clearance 40 mL      per minute.  5. Hypertension.   DISCUSSION AND PLAN:  [name] is at high risk for obstructive coronary  artery disease in the setting of longstanding diabetes, now presenting  with congestive heart failure and non-ST elevation MI.  She may have  community-acquired pneumonia, but she clearly has superimposed CHF with  an elevated BNP and echo findings suggestive of increased left  ventricular pressure.  In the absence of fever, leukocytosis, and other  systemic symptoms to suggest that heart failure may be her primary  problem.  With her positive cardiac markers, I would favor proceeding  directly to cardiac catheterization for definitive evaluation.  However,  she has an increased risk of contrast nephropathy in the setting of  diabetes, chronic kidney disease, and recent contrast study as she  underwent a CT angiogram yesterday.  Will plan to hold her ACE inhibitor  and ARB as well as her hydrochlorothiazide.  Will pretreat with Mucomyst  and try to  optimize her fluid status.  Follow up metabolic panel in the  morning.  Plan on timing of cores only cardiac catheterization February  26.   From a standpoint of medical therapy would continue aspirin, heparin and  metoprolol.  Also continue simvastatin for dyslipidemia.   Thanks for the opportunity to see this very nice lady.  Please feel free  to call at any time with questions regarding her care.      Juanda Bond. Burt Knack, MD  Electronically Signed     MDC/MEDQ  D:  10/08/2008  T:  10/08/2008  Job:  [id number]

## 2010-12-28 NOTE — H&P (Signed)
[name], [name] NO.:  [id number]   MEDICAL RECORD NO.:  [id number]          PATIENT TYPE:  EMS   LOCATION:  ED                           [facility]AC[facility]  [facility]   PHYSICIAN:  Remer Macho, MD        DATE OF BIRTH:  11-Feb-1941   DATE OF ADMISSION:  10/07/2008  DATE OF DISCHARGE:                              HISTORY & PHYSICAL   CHIEF COMPLAINT:  Respiratory distress.   HISTORY OF PRESENT ILLNESS:  The patient is a 70 year old Caucasian  female with history of hypertension and diabetes mellitus, who presents  to the emergency room with complaints of short of breath that started  earlier this morning around 2:30 a.m.  Apparently, the patient felt she  could not catch her breath.  Her husband reported some wheezing at that  time.  She was brought to the emergency room by her husband and given  some nebulizers, and did have some improvement in her symptoms.  She  complains of some chest pressure.  Denied any palpitations, dizziness,  diaphoresis, loss of consciousness or any focal weakness of any part of  the body.  No cough, fever, abdominal pain, nausea, vomiting, diarrhea  reported by the patient either.  The patient states that she was in her  normal state of health till last night.  At the time, the patient is  feeling much better and denied any chest pain or pressure.   PAST MEDICAL HISTORY:  1. Significant for hypertension.  2. Diabetes mellitus.  3. Hyperlipidemia.  4. Anxiety.  5. Insomnia.  6. History of chronic low back pain.   PAST SURGICAL HISTORY:  Multiple back surgeries in the past.   CURRENT MEDICATIONS:  1. Diovan/hydrochlorothiazide 160/12.5 once daily.  2. Amaryl 4 mg in the morning.  3. Toprol XL 100 mg in the morning.  4. Zocor 40 mg in the evening.  5. Lotrel 10/20 once daily in the morning.  6. Nexium 40 mg daily.  7. Metformin 1,000 mg twice a day.  8. Alprazolam 0.5 mg 1 tablet b.i.d. p.r.n.  9. Betimol eye drops twice daily.   ALLERGIES:  NO KNOWN DRUG ALLERGIES.   FAMILY HISTORY:  No history of hypertension, coronary artery disease or  diabetes in the family.   SOCIAL HISTORY:  The patient quit smoking about 20 years back.  She did  have a 20 pack year history of smoking before that.  Denies any alcohol  or drug use.  Lives with her husband.   REVIEW OF SYSTEMS:  An extensive review of systems and all systems are  negative except for the positives mentioned in the history of present  illness.  The patient complains of orthopnea and PND both.   PHYSICAL EXAMINATION:  VITAL SIGNS:  Pulse 106, blood pressure 144/89,  respiratory rate 20, O2 sat of 90% on room air.  GENERAL:  The patient is conscious, alert and oriented to time, place  and person.  Mild respiratory distress.  HEENT:  No scleral icterus.  Mild pallor.  Ears:  Negative.  Poor dental  hygiene.  NECK:  Supple.  No lymphadenopathy.  No JVD.  CHEST:  Breath sounds heard bilaterally.  Fair air entry, but I could  locate no rhonchi.  Diminished breath sounds/crackles at the right base.  CVS:  S1 and S2, plus regular.  No murmur, rub or gallop appreciated.  ABDOMEN:  Soft, nontender and nondistended.  Bowel sounds are present.  Obese.  No hepatosplenomegaly.  EXTREMITIES:  Edema 1+ in the lower extremities.  Peripheral pulses are  present.  NEUROLOGIC:  Cranial nerves II-XII appear grossly intact.  No focal  motor or sensory deficits noted on gross examination.   LABORATORY DATA:  WBC count 7.8, hemoglobin 10.8, hematocrit 31.4,  platelet count 111, MC 102.4, sodium 131, potassium 4.5, chloride 100,  CO2 23, BUN 18, creatinine 1.4, CK 108, CK-MB 4.1, troponin less than  0.05.  ABG showed a pH of 7.30, PCO2 47.6, PO2 145, bicarb 23.   DIAGNOSTICS:  1. Chest x-ray showed interstitial edema superimposed on chronic lung      disease, small effusions.  2. CT of the chest showed bilateral intraseptal thickening suggesting      pulmonary interstitial  edema, right pleural effusion and      consolidation at the right lung base suggestive of active      pneumonia.   IMPRESSION/PLAN:  1. This is a case of a 70 year old Caucasian female with history of      hypertension and diabetes mellitus who presented with respiratory      distress.  Respiratory distress is likely multifactorial.  The      patient has evidence of congestive heart failure on clinical      examination as well as lab and x-rays.  She also has a right base      pneumonia and possibly has chronic obstructive pulmonary disease      from history of prolonged tobacco use in the past.  We will admit      her to telemetry for observation, give her a dose of Lasix.  She      has already gotten a dose of steroids.  We will put her on      antibiotics, i.e. Zosyn given apparent pneumonic effusion.      Significantly, she does not have a white count or fever.  We will      put her on Xopenex and Atrovent nebs q.4 h. and q.12 h. p.r.n.  Her      ABG did show a pH of 7.3, we will repeat a blood gas later on      today.  We will supplement her with oxygen and keep O2 sats greater      than 92%.  2. Hypertension, controlled.  Continue current medications.  3. Diabetes mellitus.  The patient is on metformin, Amaryl and states      that her A1c is in less than 6 range.  We will discontinue her      metformin given her mild renal insufficiency.  Put her on sliding-      scale insulin coverage with regular insulin sensitive scale.  4. Gastroesophageal reflux disease.  Continue Nexium.  5. Hyperlipidemia.  Check fasting lipid panel.  Continue Zocor.  6. Anemia/thrombocytopenia.  Given elevated MCV, there is concern      about possible myelodysplastic syndrome.      We will get a peripheral smear, B12 and folate levels, and follow      up the results.  We will also check stool for  occult blood x3 and      followup.  7. Deep venous thrombosis/gastrointestinal prophylaxis.  Protonix and       subcutaneous heparin.      Remer Macho, MD  Electronically Signed     BA/MEDQ  D:  10/07/2008  T:  10/07/2008  Job:  [id number]   cc:   PCP

## 2010-12-28 NOTE — Assessment & Plan Note (Signed)
OFFICE VISIT   [name], [name]  DOB:  09/03/1940                                        November 10, 2008  CHART #:  [id number]   REASON FOR OFFICE VISIT:  Routine followup status post CABG x3 by Dr.  Prescott Gum in October 16, 2008.   HISTORY OF PRESENT ILLNESS:  This is a 70 year old Caucasian female with  a past medical history of hypertension and diabetes mellitus who  presented to [facility] Emergency Room with shortness of breath.  A  thorough workup was undertaken.  CT scan of the chest showed bilateral  intraseptal thickening suggesting pulmonary interstitial edema as well  as right pleural effusion and consolidation of the right lung suggestive  of pneumonia.  The patient was also found to have some slight elevation  of her cardiac enzymes.  She later ruled in for a nonSTEMI.  A cardiac  catheterization was done, which revealed multivessel coronary artery  disease.  The patient underwent a CABG x3 on October 16, 2008.   Since having been discharged from the hospital, the patient denies  shortness of breath or incisional pain.  However, she does have  complaints of occasional nauseousness, but no emesis as well as  decreased appetite.  She is taking protein shakes several times daily to  help supplement her decreased appetite.   PHYSICAL EXAMINATION:  GENERAL:  This is a pleasant 70 year old  Caucasian female, who is in no acute distress who is alert, oriented,  cooperative.  VITAL SIGNS:  BP 141/83, heart rate 86, respirations at 18, and O2 sat  97% on room air.  CARDIOVASCULAR:  Regular rate and rhythm.  No murmurs, gallops, or rubs.  CHEST:  Sternum is solid.  Incision is clean and dry.  ABDOMEN:  Soft, nontender.  Bowel sounds present.  EXTREMITIES:  Right lower extremity wound clean and dry.  No edema noted  of the lower extremities.   IMAGING DATA:  Chest x-ray done today shows improvement in lung  aeration, small left pleural effusion with  atelectasis, stable  cardiomegaly.  No pneumothorax.   IMPRESSION AND PLAN:  1. Overall, the patient is surgically stable status post coronary      artery bypass graft.  2. The patient encouraged to participate in outpatient cardiac rehab,      which she is agreeable.  3. The patient started she may begin driving short distances during      the day and gradually increase her distance over the next couple of      weeks.  4. The patient instructed to not lift more than 10-15 pounds for the      next 2-3 weeks.  5. The patient has a followup appointment to see Dr. Aundra Dubin on Monday.      The patient may need adjustments to her beta-blocker if her blood      pressure continues to be elevated.  He will continue to follow her      long term.   Ivin Poot, M.D.  Electronically Signed   DZ/MEDQ  D:  11/10/2008  T:  11/11/2008  Job:  [id number]   cc:   Loralie Champagne, MD

## 2010-12-28 NOTE — Consult Note (Signed)
[name], [name] NO.:  [id number]   MEDICAL RECORD NO.:  [id number]          PATIENT TYPE:  INP   LOCATION:  2502                         FACILITY:  [place]   PHYSICIAN:  Ivin Poot, M.D.  DATE OF BIRTH:  10-Jun-1941   DATE OF CONSULTATION:  10/13/2008  DATE OF DISCHARGE:                                 CONSULTATION   PRIMARY CARE PHYSICIAN:  [name], M.D., [facility]   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   CHIEF COMPLAINT:  Shortness of breath.   HISTORY OF PRESENT ILLNESS:  I was asked to evaluate this 70 year old  Caucasian diabetic female for potential surgical coronary  revascularization for recently diagnosed severe multivessel coronary  artery disease.  The patient was admitted to the hospital 6 days ago  with acute shortness of breath occuring at 3:00 a.m..  Her presentation  at the [facility] Emergency Department included a chest x-ray, which  showed interstitial pulmonary edema and cardiac enzymes were mildly  elevated.  She had wheezing and was given steroids, nebulizers, and  Lasix with improvement.  A cardiology consultation was obtained and a 2-  D echo demonstrated preserved overall LV systolic function.  There is  mild MR, mild AS, and mildly elevated right-sided pressures with  estimated PA pressure of 45 mmHg.  Her creatinine was elevated, and she  was prepared for catheterization with IV fluids, Lasix cessation, and  Mucomyst.  Diagnostic cath was performed today, which showed 99%  stenosis of the right coronary with TIMI II flow, 80% stenosis of the  proximal LAD, and mild disease of the OM bifurcation from the  circumflex.  PA pressures were 30/12, cardiac output was greater than 4  L, wedge pressure was 12.  Based on the patient's coronary anatomy and  symptoms, it was felt that surgical revascularization would be her best  long-term option.   PAST MEDICAL HISTORY:  1. Non-insulin-dependent diabetes mellitus.  2. Hypertension.  3. Chronic renal insufficiency.  4. Anxiety disorder.  5. Chronic low back pain, status post lumbar laminectomy.   HOME MEDICATIONS:  1. Diovan HCT 160/12.5 once daily.  2. Amaryl 4 mg daily.  3. Toprol XL 100 mg daily.  4. Zocor 40 mg nightly.  5. Lotrel 10/20 one q.a.m.  6. Nexium 40 mg daily.  7. Metformin 1 g b.i.d.  8. Xanax 0.5 mg b.i.d.  9. Betimol eye drops.   ALLERGIES:  None.   FAMILY HISTORY:  Negative for premature coronary artery disease or heart  bypass surgery.  Positive for diabetes.   FAMILY HISTORY:  The patient is retired from working in [employer].  She is married.  She stopped smoking 20-25 years ago.   REVIEW OF SYSTEMS:  CONSTITUTIONAL:  Her weight has been stable.  ENT:  Negative for dental complaints or difficulty swallowing.  THORACIC:  Negative for history of hemoptysis or pulmonary nodule, and  the pulmonary edema pattern on presentation has resolved on a followup  chest x-ray.  CT scan of the  chest to rule out pulmonary emboli also  demonstrated no evidence of malignancy.  CARDIAC:  Negative for arrhythmia or history of cardiac murmur or prior  MI until this presentation.  GI:  Positive for GERD.  Negative for gallstones, hepatitis, jaundice,  or blood per rectum.  UROLOGIC:  Negative for kidney stones.  ENDOCRINE:  Positive for diabetes.  Negative for thyroid disease.  VASCULAR:  Negative for DVT, claudication, or TIA.  NEUROLOGIC:  Negative for stroke or seizure.  HEMATOLOGIC:  Negative for bleeding or blood transfusion.   PHYSICAL EXAMINATION:  VITAL SIGNS:  The patient is 5 feet 8 and weighs  165 pounds.  Blood pressure is 140/80, pulse is 90 and regular,  respirations 18, saturation on room air 94%.  GENERAL APPEARANCE:  A  middle-aged Caucasian female, in no acute distress following cardiac  catheterization in the step-down unit.  HEENT:   Normocephalic.  EOMs full.  Conjunctiva pink.  Dentition good.  NECK:  Without JVD, mass, or bruit.  LYMPHATICS:  No palpable cervical or supraclavicular adenopathy.  LUNGS:  Breath sounds are clear and equal.  There is no thoracic  deformity.  CARDIAC:  Regular rhythm without S3, gallop, or murmur.  ABDOMINAL:  Obese, soft, nontender without pulsatile mass.  EXTREMITIES:  No clubbing, cyanosis, or edema.  Peripheral pulses are  intact in the extremities, and there is no evidence of venous  insufficiency of the lower extremities.  NEUROLOGIC:  Intact.  SKIN:  Without ulcers or rash.   LABORATORY DATA:  Her platelet count currently is low at 94,000.  Her  coronary arteriograms are reviewed and she has severe multivessel  coronary artery disease with preserved LV systolic function.  Her  creatinine currently is 1.7, improved from 1.9 and her hemoglobin is 12.   IMPRESSION AND PLAN:  The patient would benefit from surgical  revascularization with bypass grafts to the LAD, RCA, and possibly the  OM.  I have discussed the procedure with the patient and she is in  agreement..  We will plan on scheduling her surgery later in the week to  allow her creatinine to recover from the IV contrast load.  Thank you  for the consultation.      Ivin Poot, M.D.  Electronically Signed     PV/MEDQ  D:  10/13/2008  T:  10/14/2008  Job:  [id number]   cc:   [name], M.D.  [facility]

## 2010-12-31 NOTE — Op Note (Signed)
[name], [name]                 ACCOUNT NO.:  [id number]   MEDICAL RECORD NO.:  [id number]          PATIENT TYPE:  AMB   LOCATION:  ENDO                         FACILITY:  [place]   PHYSICIAN:  Nelwyn Salisbury, M.D.  DATE OF BIRTH:  [birth year]   DATE OF PROCEDURE:  12/26/2005  DATE OF DISCHARGE:                                 OPERATIVE REPORT   PROCEDURE:  Colonoscopy with snare polypectomies x8.   ENDOSCOPIST:  Nelwyn Salisbury, M.D.   INSTRUMENT USED:  Olympus video colonoscope.   INDICATIONS FOR PROCEDURE:  A 70 year old white female with history of  diabetes underwent screening colonoscopy to rule out colonic polyps, masses,  etc.   PREPROCEDURE PREPARATION:  Informed consent was procured from the patient.  The patient was fasted for four hours prior to the procedure and prepped  with OsmoPrep pills the night of and the morning of the procedure.  Risks  and benefits of the procedure including a 10% misread of cancer and polyps  were discussed with the patient as well.   PREPROCEDURE PHYSICAL:  VITAL SIGNS:  The patient had stable vital signs.  NECK:  Supple.  CHEST:  Clear to auscultation.  CARDIAC:  S1 and S2 regular.  ABDOMEN:  Soft with normal bowel sounds.   DESCRIPTION OF PROCEDURE:  The patient was placed in the left lateral  decubitus position and sedated with 75 mcg fentanyl and 10 mg of Versed in  slow incremental doses.  Once the patient was adequately sedated, maintained  on low-flow oxygen and continuous cardiac monitoring, the Olympus video  colonoscope was advanced from the rectum to the cecum with extreme  difficulty.  There was large amount of solid stool in the colon.  Multiple  washes were done and a small sessile polyp was removed by hot snare from the  cecum (at lower settings 150/15).  Another sessile polyp was snared from the  mid right colon at settings of 200/20.  This was also a hot snare  polypectomy.  Six polyps were removed by hot snare from  the rectum and the  rectosigmoid colon.  One of the polyps was in the anal verge.  Small  internal hemorrhoids were seen on retroflexion.  A few large diverticula  were seen in the sigmoid colon and a couple in the right colon.  The prep  was inadequate.  Multiple washings were done. Visualization was suboptimal.  The patient tolerated the procedure well without complications.   IMPRESSION:  1.  Small internal hemorrhoids seen on retroflexion.  2.  Multiple polyps removed from by hot snare from the rectum and      rectosigmoid colon.  3.  One polyp removed by hot snare from the mid right colon.  4.  One polyp removed from the cecal base by hot snare.  5.  Very poor prep.  Large amount of residual stool.   RECOMMENDATIONS:  1.  Await pathology results.  2.  Avoid all nonsteroidals including aspirin for the next two weeks.  3.  Outpatient followup in the next two weeks for further recommendations.  Nelwyn Salisbury, M.D.  Electronically Signed     JNM/MEDQ  D:  12/26/2005  T:  12/26/2005  Job:  [id number]   cc:   [name]. Redmond Pulling, M.D.  Fax: [phone]

## 2010-12-31 NOTE — Op Note (Signed)
[name], [name]                 ACCOUNT NO.:  [id number]   MEDICAL RECORD NO.:  [id number]          PATIENT TYPE:  OIB   LOCATION:  2861                         FACILITY:  [place]   PHYSICIAN:  Susa Day, M.D.    DATE OF BIRTH:  1941-06-28   DATE OF PROCEDURE:  03/16/2005  DATE OF DISCHARGE:                                 OPERATIVE REPORT   PREOPERATIVE DIAGNOSES:  1.  Spinal stenosis.  2.  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   POSTOPERATIVE DIAGNOSES:  1.  Spinal stenosis.  2.  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   PROCEDURE PERFORMED:  Lateral recess decompression, foraminotomy at L5-S1,  microdiskectomy at L5-S1.   SURGEON:  Susa Day, M.D.   ASSISTANT:  Rometta Emery, P.A.   ANESTHESIA:  General.   BRIEF HISTORY AND INDICATIONS:  This is a 70 year old with left lower  extremity radiculopathy, L5-S1 nerve root distribution, large HNP with  compression of the S1 nerve root.  Refractory to conservative treatment,  bilateral neural tension signs, diminished plantarflexion, decreased  Achilles reflex and decreased sensation.  It was also determined that  operative intervention was indicated for decompression of the S1 nerve root  bilaterally, recess decompression and microdiskectomy.  Risks and benefits  were discussed including bleeding, infection, hemorrhage, arrest, CSF  leakage, epidural fibrosis, adjacent segment disease, need for fusion in the  future, anesthetic complications, etc.   TECHNIQUE:  With the patient in supine position and after the induction of  adequate general anesthesia and 1 g of Kefzol, the patient was placed prone  on the Andrews frame, all bony prominences well-padded and the lumbar region  was prepped and draped in the usual sterile fashion.  An 18-gauge spinal  needle was utilized to localize the 5-1 interspace and confirmed with x-ray.  An incision was made from the spinous process of 5 to S1.  Subcutaneous  tissue  was dissected and electrocautery utilized to achieve hemostasis.  The  dorsolumbar fascia was identified and divided along the skin incision.  Paraspinous muscles were elevated from the lamina of 5 and 1.  A McCullough  retractor was placed.  The operating microscope was draped and brought into  the surgical field.  Thereafter, an x-ray was obtained to confirm the L5-S1  disk space.  Hemilaminotomy of the caudad edge of 5 was performed, detaching  the ligamentum flavum.  The ligamentum flavum was detached from the cephalad  edge of S1 utilizing the straight curette.  A foraminotomy of S1 was  performed to identify the root and below the nerve root, noting that the  disk herniation had migrated caudad to the disk space.  The ligamentum  flavum was detached and decompressed from the lateral recess out to the  medial  border of the pedicle.  I  retrieved a large fragment in the axilla  of the nerve root utilizing a nerve hook and a micropituitary.  An  additional fragment was noted down into the foramen and retrieved in a  similar fashion.  A third fragment was noted beneath the thecal sac and  removed with a micropituitary as well.  I was then able to gently mobilize  the S1 nerve root medially and I examined the disk space; herniated material  was noted.  I performed the annulotomy and performed a diskectomy with a  straight and an upbiting pituitary and in the subannular space, mobilizing  the fragment with a nerve hook  A foraminotomy of the 5 foramen was  performed as well.  Another fragment was noted again distal and medial to  the disk space.  There was hypertrophic avascular tissue noted as well and  this was lysed and electrocautery was utilized to achieve hemostasis.  Following this, there was a good excursion of the S1 nerve root at least a  centimeter medial to the pedicle without difficulty.  The disk space was  copiously irrigated with antibiotic irrigation.  A hockey-stick probe  was  placed in the foramina of L5 and S1 and found to be widely patent and  beneath the thecal sac, axilla and shoulder of the foot; there was no  residual disk herniation.  The root was erythematous and edematous.  There  was no evidence of CSF leakage or active bleeding.  Again, the disk space  was copiously irrigated with antibiotic irrigation and no evidence of CSF  leakage or active bleeding and thrombin-soaked Gelfoam was placed in the  laminotomy defect.  Next, the [facility] retractors were removed,  paraspinous muscles inspected with no evidence of active bleeding.  The  dorsolumbar fascia was reapproximated with 0 Vicryl in interrupted figure-of-  eight sutures, subcutaneous tissue reapproximated with 2-0 Vicryl simple  sutures and the skin was reapproximated with 4-0 subcuticular Prolene, wound  reinforced with Steri-Strips, sterile dressing applied, placed supine on the  hospital bed, extubated without difficulty and transported to the recovery  room in satisfactory condition.   The patient tolerated the procedure well with no complications.       JB/MEDQ  D:  03/16/2005  T:  03/17/2005  Job:  [id number]

## 2010-12-31 NOTE — Op Note (Signed)
[name], [name]                 ACCOUNT NO.:  [id number]   MEDICAL RECORD NO.:  [id number]          PATIENT TYPE:  OIB   LOCATION:  [place]                         [facility]AC[facility]:  [facility]   PHYSICIAN:  Susa Day, M.D.    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   PROCEDURE PERFORMED:  Lateral recess decompression and redo microdiscectomy  L5-S1, foraminotomy at S1.   ANESTHESIA:  General.   SURGEON:  Susa Day, M.D.   ASSISTANT:  Rometta Emery, P.A.-C.   HISTORY:  This is a 70 year old with recurrent disc herniation, large,  pressing the S1 nerve root, with plantar flexion and weakness in the S1  dermatome.  MRI shows a large recurrent disc.  Operative intervention was  indication for decompression of the S1 nerve root.  The risks and benefits  were discussed including bleeding, infection, CSF leak, epidural fibrosis,  need for fusion in the future, anesthetic complications, etc.   SURGICAL TECHNIQUE:  With the patient in the supine position, after  induction of adequate general anesthesia and 1 gram Kefzol, she was placed  prone on the Belle frame.  All bony prominences were well padded.  The  lumbar region was prepped and draped in the usual sterile fashion.  The  previous surgical incision was utilized through the skin.  The subcutaneous  tissue were dissected, electrocautery was utilized to achieve hemostasis.  The dorsal lumbar fascia was identified and divided in line with the skin  incision.  The paraspinous muscle was elevated from the lamina of L5-S1.  A  Scoville retractor was placed after a confirmatory radiograph was obtained  with a Penfield 4 in the interlaminar space.  I used a  curet to mobilize the  scar tissue and epidural fibrosis skeletonizing the lamina of L5 and S1 and  identifying the facet.  We enlarged the previous laminotomies with a 2 mm  Kerrison cephalad and caudad freeing the thecal sac from adhesions.  Also,  the nerve distally, the S1 nerve root was mobilized and gently retracted  with a Public affairs consultant.  A large disc herniation was noted and this was  extruded.  I incised this and then removed multiple fragments of herniated  disc material that had extruded and also from the disc space.  It was  further mobilized beneath the thecal sac with a hockey stick and a nerve  hook.  The fragments were noted in the foramina of L5 as well, and these  were retrieved.  A full discectomy of herniated material was performed with  a straight and upbiting pituitary.  We copiously irrigated the disc space  inspecting the L5 and S1 nerve roots.  There was good excursion of the S1  nerve root medial to the pedicle at least 1 cm without tension.  There was  no evidence  of disc herniation in the axilla, foramen of L5 or S1, the root,  or beneath the thecal sac.  I copiously irrigated the disc space.  No  evidence of CSF leak or active bleeding.  A thrombin soaked Gelfoam was  placed in the laminotomy defect.  The McCullough retractors were removed.  The paraspinous muscles were inspected, irrigated, with no active bleeding.  The dorsal lumbar fascia was reapproximated with #1 Vicryl interrupted  figure-of-eight sutures, the subcutaneous tissues were reapproximated with 2-  0 Vicryl simple sutures, the  skin was reapproximated with 4-0 subcuticular Prolene.  The wounds were  reinforced with Steri-Strips.  A sterile dressing was applied.  She was  placed supine on the hospital bed, extubated without difficulty, and  transported to the recovery room in satisfactory condition.  The patient  tolerated the procedure well with no complications.      Susa Day,  M.D.  Electronically Signed     JB/MEDQ  D:  06/01/2006  T:  06/02/2006  Job:  [id number]

## 2011-01-05 ENCOUNTER — Other Ambulatory Visit: Payer: Self-pay | Admitting: Oncology

## 2011-01-05 ENCOUNTER — Encounter (HOSPITAL_BASED_OUTPATIENT_CLINIC_OR_DEPARTMENT_OTHER): Payer: Medicare Other | Admitting: Oncology

## 2011-01-05 DIAGNOSIS — D649 Anemia, unspecified: Secondary | ICD-10-CM

## 2011-01-05 DIAGNOSIS — N189 Chronic kidney disease, unspecified: Secondary | ICD-10-CM

## 2011-01-05 DIAGNOSIS — D696 Thrombocytopenia, unspecified: Secondary | ICD-10-CM

## 2011-01-05 DIAGNOSIS — N289 Disorder of kidney and ureter, unspecified: Secondary | ICD-10-CM

## 2011-01-05 DIAGNOSIS — D638 Anemia in other chronic diseases classified elsewhere: Secondary | ICD-10-CM

## 2011-01-05 DIAGNOSIS — D631 Anemia in chronic kidney disease: Secondary | ICD-10-CM

## 2011-01-05 DIAGNOSIS — N039 Chronic nephritic syndrome with unspecified morphologic changes: Secondary | ICD-10-CM

## 2011-01-05 LAB — CBC WITH DIFFERENTIAL/PLATELET
BASO%: 0.3 % (ref 0.0–2.0)
EOS%: 6.9 % (ref 0.0–7.0)
LYMPH%: 21.1 % (ref 14.0–49.7)
MCH: 33.5 pg (ref 25.1–34.0)
MCHC: 33.6 g/dL (ref 31.5–36.0)
MCV: 99.7 fL (ref 79.5–101.0)
MONO%: 13.1 % (ref 0.0–14.0)
Platelets: 106 10*3/uL — ABNORMAL LOW (ref 145–400)
RBC: 3.28 10*6/uL — ABNORMAL LOW (ref 3.70–5.45)
nRBC: 0 % (ref 0–0)

## 2011-01-26 ENCOUNTER — Encounter (HOSPITAL_BASED_OUTPATIENT_CLINIC_OR_DEPARTMENT_OTHER): Payer: Medicare Other | Admitting: Oncology

## 2011-01-26 ENCOUNTER — Other Ambulatory Visit: Payer: Self-pay | Admitting: Oncology

## 2011-01-26 DIAGNOSIS — D631 Anemia in chronic kidney disease: Secondary | ICD-10-CM

## 2011-01-26 DIAGNOSIS — N189 Chronic kidney disease, unspecified: Secondary | ICD-10-CM

## 2011-01-26 DIAGNOSIS — D539 Nutritional anemia, unspecified: Secondary | ICD-10-CM

## 2011-01-26 DIAGNOSIS — D649 Anemia, unspecified: Secondary | ICD-10-CM

## 2011-01-26 DIAGNOSIS — N039 Chronic nephritic syndrome with unspecified morphologic changes: Secondary | ICD-10-CM

## 2011-01-26 DIAGNOSIS — N289 Disorder of kidney and ureter, unspecified: Secondary | ICD-10-CM

## 2011-01-26 LAB — CBC WITH DIFFERENTIAL/PLATELET
BASO%: 0.2 % (ref 0.0–2.0)
EOS%: 7.4 % — ABNORMAL HIGH (ref 0.0–7.0)
HCT: 27.7 % — ABNORMAL LOW (ref 34.8–46.6)
MCH: 32.9 pg (ref 25.1–34.0)
MCHC: 35.4 g/dL (ref 31.5–36.0)
NEUT%: 60.4 % (ref 38.4–76.8)
RBC: 2.98 10*6/uL — ABNORMAL LOW (ref 3.70–5.45)
RDW: 12.9 % (ref 11.2–14.5)
lymph#: 2 10*3/uL (ref 0.9–3.3)

## 2011-02-17 ENCOUNTER — Encounter (HOSPITAL_BASED_OUTPATIENT_CLINIC_OR_DEPARTMENT_OTHER): Payer: Medicare Other | Admitting: Oncology

## 2011-02-17 ENCOUNTER — Other Ambulatory Visit: Payer: Self-pay | Admitting: Oncology

## 2011-02-17 DIAGNOSIS — N039 Chronic nephritic syndrome with unspecified morphologic changes: Secondary | ICD-10-CM

## 2011-02-17 DIAGNOSIS — N189 Chronic kidney disease, unspecified: Secondary | ICD-10-CM

## 2011-02-17 DIAGNOSIS — D631 Anemia in chronic kidney disease: Secondary | ICD-10-CM

## 2011-02-17 DIAGNOSIS — D539 Nutritional anemia, unspecified: Secondary | ICD-10-CM

## 2011-02-17 LAB — CBC WITH DIFFERENTIAL/PLATELET
MCH: 33.2 pg (ref 25.1–34.0)
MCHC: 34.9 g/dL (ref 31.5–36.0)
MONO%: 18.8 % — ABNORMAL HIGH (ref 0.0–14.0)
RDW: 13.6 % (ref 11.2–14.5)
lymph#: 1.4 10*3/uL (ref 0.9–3.3)

## 2011-03-09 ENCOUNTER — Other Ambulatory Visit: Payer: Self-pay | Admitting: Oncology

## 2011-03-09 ENCOUNTER — Encounter (HOSPITAL_BASED_OUTPATIENT_CLINIC_OR_DEPARTMENT_OTHER): Payer: Medicare Other | Admitting: Oncology

## 2011-03-09 DIAGNOSIS — D631 Anemia in chronic kidney disease: Secondary | ICD-10-CM

## 2011-03-09 DIAGNOSIS — N289 Disorder of kidney and ureter, unspecified: Secondary | ICD-10-CM

## 2011-03-09 DIAGNOSIS — D638 Anemia in other chronic diseases classified elsewhere: Secondary | ICD-10-CM

## 2011-03-09 DIAGNOSIS — D539 Nutritional anemia, unspecified: Secondary | ICD-10-CM

## 2011-03-09 DIAGNOSIS — N189 Chronic kidney disease, unspecified: Secondary | ICD-10-CM

## 2011-03-09 DIAGNOSIS — D696 Thrombocytopenia, unspecified: Secondary | ICD-10-CM

## 2011-03-09 LAB — CBC WITH DIFFERENTIAL/PLATELET
BASO%: 0.2 % (ref 0.0–2.0)
Basophils Absolute: 0 10*3/uL (ref 0.0–0.1)
EOS%: 3.8 % (ref 0.0–7.0)
HCT: 30.1 % — ABNORMAL LOW (ref 34.8–46.6)
HGB: 10.3 g/dL — ABNORMAL LOW (ref 11.6–15.9)
LYMPH%: 12.7 % — ABNORMAL LOW (ref 14.0–49.7)
MCH: 32.6 pg (ref 25.1–34.0)
MCHC: 34.2 g/dL (ref 31.5–36.0)
MCV: 95.3 fL (ref 79.5–101.0)
NEUT%: 67.3 % (ref 38.4–76.8)
Platelets: 147 10*3/uL (ref 145–400)
lymph#: 1.1 10*3/uL (ref 0.9–3.3)

## 2011-03-30 ENCOUNTER — Encounter (HOSPITAL_BASED_OUTPATIENT_CLINIC_OR_DEPARTMENT_OTHER): Payer: Medicare Other | Admitting: Oncology

## 2011-03-30 ENCOUNTER — Other Ambulatory Visit: Payer: Self-pay | Admitting: Oncology

## 2011-03-30 DIAGNOSIS — N289 Disorder of kidney and ureter, unspecified: Secondary | ICD-10-CM

## 2011-03-30 DIAGNOSIS — D631 Anemia in chronic kidney disease: Secondary | ICD-10-CM

## 2011-03-30 DIAGNOSIS — N189 Chronic kidney disease, unspecified: Secondary | ICD-10-CM

## 2011-03-30 DIAGNOSIS — D539 Nutritional anemia, unspecified: Secondary | ICD-10-CM

## 2011-03-30 LAB — CBC WITH DIFFERENTIAL/PLATELET
BASO%: 0.4 % (ref 0.0–2.0)
Basophils Absolute: 0 10*3/uL (ref 0.0–0.1)
EOS%: 3.5 % (ref 0.0–7.0)
HGB: 10.6 g/dL — ABNORMAL LOW (ref 11.6–15.9)
MCH: 34.3 pg — ABNORMAL HIGH (ref 25.1–34.0)
MCHC: 34.2 g/dL (ref 31.5–36.0)
MCV: 100.3 fL (ref 79.5–101.0)
MONO%: 11.7 % (ref 0.0–14.0)
NEUT%: 70.4 % (ref 38.4–76.8)
RDW: 14.8 % — ABNORMAL HIGH (ref 11.2–14.5)
lymph#: 1.1 10*3/uL (ref 0.9–3.3)

## 2011-04-20 ENCOUNTER — Encounter (HOSPITAL_BASED_OUTPATIENT_CLINIC_OR_DEPARTMENT_OTHER): Payer: Medicare Other | Admitting: Oncology

## 2011-04-20 ENCOUNTER — Other Ambulatory Visit: Payer: Self-pay | Admitting: Oncology

## 2011-04-20 DIAGNOSIS — D539 Nutritional anemia, unspecified: Secondary | ICD-10-CM

## 2011-04-20 LAB — CBC WITH DIFFERENTIAL/PLATELET
BASO%: 0.4 % (ref 0.0–2.0)
EOS%: 5.6 % (ref 0.0–7.0)
MCH: 32.4 pg (ref 25.1–34.0)
MCV: 101.7 fL — ABNORMAL HIGH (ref 79.5–101.0)
MONO%: 12 % (ref 0.0–14.0)
NEUT#: 2.9 10*3/uL (ref 1.5–6.5)
RBC: 3.49 10*6/uL — ABNORMAL LOW (ref 3.70–5.45)
RDW: 15 % — ABNORMAL HIGH (ref 11.2–14.5)
lymph#: 1.4 10*3/uL (ref 0.9–3.3)

## 2011-05-11 ENCOUNTER — Encounter (HOSPITAL_BASED_OUTPATIENT_CLINIC_OR_DEPARTMENT_OTHER): Payer: Medicare Other | Admitting: Oncology

## 2011-05-11 ENCOUNTER — Other Ambulatory Visit: Payer: Self-pay | Admitting: Medical

## 2011-05-11 DIAGNOSIS — N189 Chronic kidney disease, unspecified: Secondary | ICD-10-CM

## 2011-05-11 DIAGNOSIS — D631 Anemia in chronic kidney disease: Secondary | ICD-10-CM

## 2011-05-11 DIAGNOSIS — D649 Anemia, unspecified: Secondary | ICD-10-CM

## 2011-05-11 DIAGNOSIS — N289 Disorder of kidney and ureter, unspecified: Secondary | ICD-10-CM

## 2011-05-11 DIAGNOSIS — D638 Anemia in other chronic diseases classified elsewhere: Secondary | ICD-10-CM

## 2011-05-11 DIAGNOSIS — D696 Thrombocytopenia, unspecified: Secondary | ICD-10-CM

## 2011-05-11 LAB — CBC WITH DIFFERENTIAL/PLATELET
Basophils Absolute: 0.1 10*3/uL (ref 0.0–0.1)
Eosinophils Absolute: 4.4 10*3/uL — ABNORMAL HIGH (ref 0.0–0.5)
HGB: 10.8 g/dL — ABNORMAL LOW (ref 11.6–15.9)
MONO#: 0.7 10*3/uL (ref 0.1–0.9)
MONO%: 6.4 % (ref 0.0–14.0)
NEUT#: 4.5 10*3/uL (ref 1.5–6.5)
RBC: 3.17 10*6/uL — ABNORMAL LOW (ref 3.70–5.45)
RDW: 14.3 % (ref 11.2–14.5)
WBC: 11.4 10*3/uL — ABNORMAL HIGH (ref 3.9–10.3)
lymph#: 1.6 10*3/uL (ref 0.9–3.3)

## 2011-06-01 ENCOUNTER — Encounter (HOSPITAL_BASED_OUTPATIENT_CLINIC_OR_DEPARTMENT_OTHER): Payer: Medicare Other | Admitting: Oncology

## 2011-06-01 ENCOUNTER — Other Ambulatory Visit: Payer: Self-pay | Admitting: Oncology

## 2011-06-01 DIAGNOSIS — D539 Nutritional anemia, unspecified: Secondary | ICD-10-CM

## 2011-06-01 LAB — CBC WITH DIFFERENTIAL/PLATELET
Basophils Absolute: 0 10*3/uL (ref 0.0–0.1)
Eosinophils Absolute: 0.4 10*3/uL (ref 0.0–0.5)
HCT: 36.3 % (ref 34.8–46.6)
HGB: 12.3 g/dL (ref 11.6–15.9)
LYMPH%: 28.8 % (ref 14.0–49.7)
MONO#: 0.6 10*3/uL (ref 0.1–0.9)
NEUT#: 2.4 10*3/uL (ref 1.5–6.5)
Platelets: 92 10*3/uL — ABNORMAL LOW (ref 145–400)
RBC: 3.51 10*6/uL — ABNORMAL LOW (ref 3.70–5.45)
WBC: 4.9 10*3/uL (ref 3.9–10.3)

## 2011-06-07 ENCOUNTER — Other Ambulatory Visit: Payer: Self-pay | Admitting: Oncology

## 2011-06-08 ENCOUNTER — Encounter: Payer: Self-pay | Admitting: Oncology

## 2011-06-22 ENCOUNTER — Other Ambulatory Visit (HOSPITAL_BASED_OUTPATIENT_CLINIC_OR_DEPARTMENT_OTHER): Payer: Medicare Other | Admitting: Lab

## 2011-06-22 ENCOUNTER — Ambulatory Visit: Payer: Medicare Other

## 2011-06-22 ENCOUNTER — Other Ambulatory Visit: Payer: Self-pay | Admitting: Oncology

## 2011-06-22 DIAGNOSIS — N189 Chronic kidney disease, unspecified: Secondary | ICD-10-CM

## 2011-06-22 DIAGNOSIS — D539 Nutritional anemia, unspecified: Secondary | ICD-10-CM

## 2011-06-22 DIAGNOSIS — D649 Anemia, unspecified: Secondary | ICD-10-CM

## 2011-06-22 LAB — CBC WITH DIFFERENTIAL/PLATELET
Basophils Absolute: 0 10*3/uL (ref 0.0–0.1)
EOS%: 7.7 % — ABNORMAL HIGH (ref 0.0–7.0)
Eosinophils Absolute: 0.4 10*3/uL (ref 0.0–0.5)
LYMPH%: 26.6 % (ref 14.0–49.7)
MCH: 34.9 pg — ABNORMAL HIGH (ref 25.1–34.0)
MCV: 101.3 fL — ABNORMAL HIGH (ref 79.5–101.0)
MONO%: 11.9 % (ref 0.0–14.0)
NEUT#: 2.6 10*3/uL (ref 1.5–6.5)
Platelets: 125 10*3/uL — ABNORMAL LOW (ref 145–400)
RBC: 3.32 10*6/uL — ABNORMAL LOW (ref 3.70–5.45)
RDW: 13.7 % (ref 11.2–14.5)

## 2011-06-22 MED ORDER — DARBEPOETIN ALFA-POLYSORBATE 500 MCG/ML IJ SOLN: 300.0000 ug | Freq: Once | INTRAMUSCULAR | Status: DC

## 2011-06-22 NOTE — Progress Notes (Signed)
Aranesp injection not given  HGB >10.9.

## 2011-07-12 ENCOUNTER — Other Ambulatory Visit: Payer: Self-pay | Admitting: Oncology

## 2011-07-13 ENCOUNTER — Other Ambulatory Visit (HOSPITAL_BASED_OUTPATIENT_CLINIC_OR_DEPARTMENT_OTHER): Payer: Medicare Other | Admitting: Lab

## 2011-07-13 ENCOUNTER — Other Ambulatory Visit: Payer: Self-pay | Admitting: Oncology

## 2011-07-13 ENCOUNTER — Ambulatory Visit: Payer: Medicare Other

## 2011-07-13 DIAGNOSIS — N189 Chronic kidney disease, unspecified: Secondary | ICD-10-CM

## 2011-07-13 DIAGNOSIS — D649 Anemia, unspecified: Secondary | ICD-10-CM

## 2011-07-13 DIAGNOSIS — D539 Nutritional anemia, unspecified: Secondary | ICD-10-CM

## 2011-07-13 LAB — CBC WITH DIFFERENTIAL/PLATELET
Basophils Absolute: 0 10*3/uL (ref 0.0–0.1)
Eosinophils Absolute: 0.3 10*3/uL (ref 0.0–0.5)
MCHC: 34.5 g/dL (ref 31.5–36.0)
MCV: 101.8 fL — ABNORMAL HIGH (ref 79.5–101.0)
NEUT%: 58.6 % (ref 38.4–76.8)
Platelets: 128 10*3/uL — ABNORMAL LOW (ref 145–400)
RBC: 3.16 10*6/uL — ABNORMAL LOW (ref 3.70–5.45)
RDW: 13.7 % (ref 11.2–14.5)

## 2011-07-13 MED ORDER — DARBEPOETIN ALFA-POLYSORBATE 500 MCG/ML IJ SOLN: 300.0000 ug | Freq: Once | INTRAMUSCULAR | Status: DC

## 2011-08-03 ENCOUNTER — Ambulatory Visit (HOSPITAL_BASED_OUTPATIENT_CLINIC_OR_DEPARTMENT_OTHER): Payer: Medicare Other

## 2011-08-03 ENCOUNTER — Other Ambulatory Visit: Payer: Self-pay | Admitting: Oncology

## 2011-08-03 ENCOUNTER — Other Ambulatory Visit (HOSPITAL_BASED_OUTPATIENT_CLINIC_OR_DEPARTMENT_OTHER): Payer: Medicare Other

## 2011-08-03 DIAGNOSIS — D638 Anemia in other chronic diseases classified elsewhere: Secondary | ICD-10-CM

## 2011-08-03 DIAGNOSIS — N189 Chronic kidney disease, unspecified: Secondary | ICD-10-CM

## 2011-08-03 DIAGNOSIS — D649 Anemia, unspecified: Secondary | ICD-10-CM

## 2011-08-03 DIAGNOSIS — D539 Nutritional anemia, unspecified: Secondary | ICD-10-CM

## 2011-08-03 DIAGNOSIS — N289 Disorder of kidney and ureter, unspecified: Secondary | ICD-10-CM

## 2011-08-03 LAB — CBC WITH DIFFERENTIAL/PLATELET
Basophils Absolute: 0 10*3/uL (ref 0.0–0.1)
Eosinophils Absolute: 0.4 10*3/uL (ref 0.0–0.5)
HCT: 28.7 % — ABNORMAL LOW (ref 34.8–46.6)
HGB: 9.8 g/dL — ABNORMAL LOW (ref 11.6–15.9)
LYMPH%: 29.9 % (ref 14.0–49.7)
MCV: 99 fL (ref 79.5–101.0)
MONO%: 12.3 % (ref 0.0–14.0)
NEUT#: 3.7 10*3/uL (ref 1.5–6.5)
NEUT%: 52.2 % (ref 38.4–76.8)
Platelets: 110 10*3/uL — ABNORMAL LOW (ref 145–400)

## 2011-08-03 MED ORDER — DARBEPOETIN ALFA-POLYSORBATE 500 MCG/ML IJ SOLN
300.0000 ug | Freq: Once | INTRAMUSCULAR | Status: AC
  Administered 2011-08-03: 300 ug via SUBCUTANEOUS
  Filled 2011-08-03: qty 1

## 2011-09-14 ENCOUNTER — Other Ambulatory Visit: Payer: Medicare Other | Admitting: Lab

## 2011-09-14 ENCOUNTER — Ambulatory Visit (HOSPITAL_BASED_OUTPATIENT_CLINIC_OR_DEPARTMENT_OTHER): Payer: Medicare Other | Admitting: Oncology

## 2011-09-14 ENCOUNTER — Telehealth: Payer: Self-pay | Admitting: Oncology

## 2011-09-14 VITALS — BP 142/72 | HR 64 | Temp 96.7°F | Ht 68.0 in | Wt 142.4 lb

## 2011-09-14 DIAGNOSIS — F411 Generalized anxiety disorder: Secondary | ICD-10-CM

## 2011-09-14 DIAGNOSIS — N189 Chronic kidney disease, unspecified: Secondary | ICD-10-CM

## 2011-09-14 DIAGNOSIS — I739 Peripheral vascular disease, unspecified: Secondary | ICD-10-CM

## 2011-09-14 DIAGNOSIS — D696 Thrombocytopenia, unspecified: Secondary | ICD-10-CM

## 2011-09-14 DIAGNOSIS — I10 Essential (primary) hypertension: Secondary | ICD-10-CM

## 2011-09-14 DIAGNOSIS — M545 Low back pain, unspecified: Secondary | ICD-10-CM

## 2011-09-14 DIAGNOSIS — E118 Type 2 diabetes mellitus with unspecified complications: Secondary | ICD-10-CM

## 2011-09-14 DIAGNOSIS — D649 Anemia, unspecified: Secondary | ICD-10-CM

## 2011-09-14 DIAGNOSIS — K219 Gastro-esophageal reflux disease without esophagitis: Secondary | ICD-10-CM

## 2011-09-14 DIAGNOSIS — I6529 Occlusion and stenosis of unspecified carotid artery: Secondary | ICD-10-CM

## 2011-09-14 DIAGNOSIS — G4700 Insomnia, unspecified: Secondary | ICD-10-CM

## 2011-09-14 DIAGNOSIS — E785 Hyperlipidemia, unspecified: Secondary | ICD-10-CM

## 2011-09-14 DIAGNOSIS — D539 Nutritional anemia, unspecified: Secondary | ICD-10-CM

## 2011-09-14 DIAGNOSIS — I5032 Chronic diastolic (congestive) heart failure: Secondary | ICD-10-CM

## 2011-09-14 DIAGNOSIS — I2510 Atherosclerotic heart disease of native coronary artery without angina pectoris: Secondary | ICD-10-CM

## 2011-09-14 DIAGNOSIS — I359 Nonrheumatic aortic valve disorder, unspecified: Secondary | ICD-10-CM

## 2011-09-14 DIAGNOSIS — I5040 Unspecified combined systolic (congestive) and diastolic (congestive) heart failure: Secondary | ICD-10-CM

## 2011-09-14 LAB — CBC WITH DIFFERENTIAL/PLATELET
BASO%: 0.4 % (ref 0.0–2.0)
Basophils Absolute: 0 10*3/uL (ref 0.0–0.1)
EOS%: 5.1 % (ref 0.0–7.0)
HCT: 31.6 % — ABNORMAL LOW (ref 34.8–46.6)
LYMPH%: 25.3 % (ref 14.0–49.7)
MCH: 35.5 pg — ABNORMAL HIGH (ref 25.1–34.0)
MCHC: 33.8 g/dL (ref 31.5–36.0)
NEUT%: 57.5 % (ref 38.4–76.8)
Platelets: 127 10*3/uL — ABNORMAL LOW (ref 145–400)

## 2011-09-14 MED ORDER — DARBEPOETIN ALFA-POLYSORBATE 300 MCG/0.6ML IJ SOLN
300.0000 ug | Freq: Once | INTRAMUSCULAR | Status: AC
  Administered 2011-09-14: 300 ug via SUBCUTANEOUS
  Filled 2011-09-14: qty 0.6

## 2011-09-14 NOTE — Progress Notes (Signed)
Hematology and Oncology Follow Up Visit  [name] [id number] [birth year] 71 y.o. 09/14/2011 9:13 AM  CC: [name]. Redmond Pulling, M.D.    Principle Diagnosis: This is a 71 year old female with multifactorial anemia.  She has anemia of chronic disease, anemia of renal disease, diagnosed in 2011.   Current therapy: :  Aranesp 300 mcg subcutaneously every 3 weeks keeping hemoglobin above 11.  Interim History: [name] presents today for a followup visit.  This is a very pleasant woman with a few comorbid conditions and history of renal insufficiency with a diagnosis of chronic anemia. She has been on Aranesp now for about a year or so and has really helped her overall quality of life and improvement in her performance status.  She had really reported some increase in her energy, increase in her appetite.  She still has some balance issues.  She has difficulty maintaining balance at times although she did not have any falls and at times requires assistance from her husband for certain activities of daily living.  She was diagnosed with hyponatremia and seems to be improving at this time, but otherwise really no major changes in her quality of life.  She has not had any problems associated with Aranesp.  She had not had any injectio or infusion problem and denied any toxicities.  Medications: I have reviewed the patient's current medications. No current outpatient prescriptions on file.  Allergies: Allergies not on file  Past Medical History, Surgical history, Social history, and Family History were reviewed and updated.  Review of Systems: Constitutional:  Negative for fever, chills, night sweats, anorexia, weight loss, pain. Cardiovascular: no chest pain or dyspnea on exertion Respiratory: no cough, shortness of breath, or wheezing Neurological: no TIA or stroke symptoms Dermatological: negative ENT: negative Skin: Negative. Gastrointestinal: no abdominal pain, change in bowel habits, or black  or bloody stools Genito-Urinary: no dysuria, trouble voiding, or hematuria Hematological and Lymphatic: negative Breast: negative Musculoskeletal: negative Remaining ROS negative. Physical Exam: Blood pressure 142/72, pulse 64, temperature 96.7 F (35.9 C), temperature source Oral, height 5\' 8"  (1.727 m), weight 142 lb 6.4 oz (64.592 kg). ECOG: 1 General appearance: alert Head: Normocephalic, without obvious abnormality, atraumatic Neck: no adenopathy, no carotid bruit, no JVD, supple, symmetrical, trachea midline and thyroid not enlarged, symmetric, no tenderness/mass/nodules Lymph nodes: Cervical, supraclavicular, and axillary nodes normal. Heart:regular rate and rhythm, S1, S2 normal, no murmur, click, rub or gallop Lung:chest clear, no wheezing, rales, normal symmetric air entry Abdomin: soft, non-tender, without masses or organomegaly EXT:no erythema, induration, or nodules   Lab Results: Lab Results  Component Value Date   WBC 5.8 09/14/2011   HGB 10.7* 09/14/2011   HCT 31.6* 09/14/2011   MCV 105.2* 09/14/2011   PLT 127* 09/14/2011     Chemistry      Component Value Date/Time   NA 129* 12/22/2010 0945   NA 129* 12/22/2010 0945   K 4.4 12/22/2010 0945   K 4.4 12/22/2010 0945   CL 99 12/22/2010 0945   CL 99 12/22/2010 0945   CO2 19 12/22/2010 0945   CO2 19 12/22/2010 0945   BUN 34* 12/22/2010 0945   BUN 34* 12/22/2010 0945   CREATININE 1.77* 12/22/2010 0945   CREATININE 1.77* 12/22/2010 0945      Component Value Date/Time   CALCIUM 9.0 12/22/2010 0945   CALCIUM 9.0 12/22/2010 0945   ALKPHOS 77 12/22/2010 0945   ALKPHOS 77 12/22/2010 0945   AST 19 12/22/2010 0945   AST 19  12/22/2010 0945   ALT 13 12/22/2010 0945   ALT 13 12/22/2010 0945   BILITOT 0.4 12/22/2010 0945   BILITOT 0.4 12/22/2010 0945         Impression and Plan:  A 71 year old female with the following issues:  1. 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  We will keep her on Aranesp, keep her hemoglobin above  11. 2. Thrombocytopenia.  Her platelet counts are adequate at the time at 127,00. No major changes at this time. However, will continue to monitor her blood counts closely.  The likelihood of possibly developing myelodysplastic syndrome is always a possibility.  We will continue to monitor at this time.  [facility], MD 1/30/20139:13 AM

## 2011-09-14 NOTE — Telephone Encounter (Signed)
appts made and printed for feb,march,april-lab inj and then md   aom

## 2011-10-06 ENCOUNTER — Ambulatory Visit (HOSPITAL_BASED_OUTPATIENT_CLINIC_OR_DEPARTMENT_OTHER): Payer: Medicare Other

## 2011-10-06 ENCOUNTER — Other Ambulatory Visit: Payer: Medicare Other | Admitting: Lab

## 2011-10-06 DIAGNOSIS — I739 Peripheral vascular disease, unspecified: Secondary | ICD-10-CM

## 2011-10-06 DIAGNOSIS — K219 Gastro-esophageal reflux disease without esophagitis: Secondary | ICD-10-CM

## 2011-10-06 DIAGNOSIS — I6529 Occlusion and stenosis of unspecified carotid artery: Secondary | ICD-10-CM

## 2011-10-06 DIAGNOSIS — D649 Anemia, unspecified: Secondary | ICD-10-CM

## 2011-10-06 DIAGNOSIS — E118 Type 2 diabetes mellitus with unspecified complications: Secondary | ICD-10-CM

## 2011-10-06 DIAGNOSIS — E785 Hyperlipidemia, unspecified: Secondary | ICD-10-CM

## 2011-10-06 DIAGNOSIS — I359 Nonrheumatic aortic valve disorder, unspecified: Secondary | ICD-10-CM

## 2011-10-06 DIAGNOSIS — F411 Generalized anxiety disorder: Secondary | ICD-10-CM

## 2011-10-06 DIAGNOSIS — M545 Low back pain, unspecified: Secondary | ICD-10-CM

## 2011-10-06 DIAGNOSIS — D696 Thrombocytopenia, unspecified: Secondary | ICD-10-CM

## 2011-10-06 DIAGNOSIS — G4700 Insomnia, unspecified: Secondary | ICD-10-CM

## 2011-10-06 DIAGNOSIS — I5040 Unspecified combined systolic (congestive) and diastolic (congestive) heart failure: Secondary | ICD-10-CM

## 2011-10-06 DIAGNOSIS — I2510 Atherosclerotic heart disease of native coronary artery without angina pectoris: Secondary | ICD-10-CM

## 2011-10-06 DIAGNOSIS — I10 Essential (primary) hypertension: Secondary | ICD-10-CM

## 2011-10-06 DIAGNOSIS — N189 Chronic kidney disease, unspecified: Secondary | ICD-10-CM

## 2011-10-06 DIAGNOSIS — I5032 Chronic diastolic (congestive) heart failure: Secondary | ICD-10-CM

## 2011-10-06 LAB — CBC WITH DIFFERENTIAL/PLATELET
BASO%: 0.4 % (ref 0.0–2.0)
EOS%: 4.1 % (ref 0.0–7.0)
MCH: 35.6 pg — ABNORMAL HIGH (ref 25.1–34.0)
MCHC: 33.3 g/dL (ref 31.5–36.0)
MONO#: 0.6 10*3/uL (ref 0.1–0.9)
RBC: 3.41 10*6/uL — ABNORMAL LOW (ref 3.70–5.45)
WBC: 4.8 10*3/uL (ref 3.9–10.3)
lymph#: 1 10*3/uL (ref 0.9–3.3)

## 2011-10-06 NOTE — Progress Notes (Signed)
Pt entered center today for labs and possible Aranesp injection. Labs  Are noted at a Hemoglobin of 12.2 and Hematocrit of 36.5. Injection held  Per orders. Pt and family member instructed to keep scheduled appointments and to call for issues. Pt and family member verbalized  Understanding of  Instructions.

## 2011-10-13 ENCOUNTER — Other Ambulatory Visit: Payer: Self-pay | Admitting: Cardiology

## 2011-10-13 DIAGNOSIS — I6529 Occlusion and stenosis of unspecified carotid artery: Secondary | ICD-10-CM

## 2011-10-20 ENCOUNTER — Encounter: Payer: Self-pay | Admitting: *Deleted

## 2011-10-24 ENCOUNTER — Telehealth: Payer: Self-pay | Admitting: Cardiology

## 2011-10-24 ENCOUNTER — Encounter: Payer: Self-pay | Admitting: Cardiology

## 2011-10-24 ENCOUNTER — Ambulatory Visit (INDEPENDENT_AMBULATORY_CARE_PROVIDER_SITE_OTHER): Payer: Medicare Other | Admitting: Cardiology

## 2011-10-24 ENCOUNTER — Encounter (INDEPENDENT_AMBULATORY_CARE_PROVIDER_SITE_OTHER): Payer: Medicare Other

## 2011-10-24 DIAGNOSIS — I6529 Occlusion and stenosis of unspecified carotid artery: Secondary | ICD-10-CM

## 2011-10-24 DIAGNOSIS — I2510 Atherosclerotic heart disease of native coronary artery without angina pectoris: Secondary | ICD-10-CM

## 2011-10-24 DIAGNOSIS — I10 Essential (primary) hypertension: Secondary | ICD-10-CM

## 2011-10-24 DIAGNOSIS — I739 Peripheral vascular disease, unspecified: Secondary | ICD-10-CM

## 2011-10-24 DIAGNOSIS — I25810 Atherosclerosis of coronary artery bypass graft(s) without angina pectoris: Secondary | ICD-10-CM

## 2011-10-24 DIAGNOSIS — I359 Nonrheumatic aortic valve disorder, unspecified: Secondary | ICD-10-CM

## 2011-10-24 MED ORDER — CARVEDILOL 12.5 MG PO TABS: 12.5000 mg | ORAL_TABLET | Freq: Two times a day (BID) | ORAL | Status: DC

## 2011-10-24 MED ORDER — AMLODIPINE BESYLATE 10 MG PO TABS: 10.0000 mg | ORAL_TABLET | Freq: Every day | ORAL | Status: DC

## 2011-10-24 MED ORDER — ROSUVASTATIN CALCIUM 10 MG PO TABS: 10.0000 mg | ORAL_TABLET | Freq: Every day | ORAL | Status: DC

## 2011-10-24 NOTE — Telephone Encounter (Signed)
I talked with pt.  Dr Aundra Dubin was going to increase amlodipine to 10mg  daily today.  Pt states she is already taking amlodipine 10mg  daily. I will forward to Dr Aundra Dubin for review and recommendations.

## 2011-10-24 NOTE — Telephone Encounter (Signed)
Stop Toprol XL and take Coreg 12.5 mg bid.  BP check in 2 wks.

## 2011-10-24 NOTE — Assessment & Plan Note (Signed)
Carotids were done today.  Will call her with results.

## 2011-10-24 NOTE — Assessment & Plan Note (Signed)
Increase amlodipine to 10 mg daily.  We will call him in 2 wks to see what BP is running.

## 2011-10-24 NOTE — Progress Notes (Signed)
PCP: Dr. Kathryne Eriksson  71 yo with h/o CAD s/p CABG and diastolic CHF presents for followup. She has been doing well at home. No chest pain and no exertional dyspnea. She has had trouble with balance so is not getting much exercise in general.  She denies lightheadedness or syncope.  She has been following a low cholesterol diet and has lost weight.  She has had some renal dysfunction and is now off Benicar/HCTZ.  Her simvastatin was stopped because she started on amlodipine.  She was put on pravastatin but there was some concern that it made her balance worse.  Therefore, she is now not on a statin.  Also, BP has been running high.  It is 152/80 here today.   ECG: NSR, old ASMI, right axis deviation.  Labs (4/10): creatinine 1.9  Labs (7/10): LDL 49, HDL 51, LFTs normal  Labs (1/11): K 5.0, creatinine 1.4  Labs (2/11): LDL 51, HDL 62, HCT 27  Labs (5/12): K 4.4, creatinine 1.77 Labs (2/13): HCT 36.5  Allergies (verified):  No Known Drug Allergies   Past Medical History:  1. CAD: Pt presented 2/10 to [facility] with NSTEMI and diastolic CHF exacerbation. LHC was done 3/10 showing 99% pRCA stenosis and 80% calcified pLAD stenosis with L=>R collaterals. Pt was referred for CABG which was done by Dr. Prescott Gum with LIMA-LAD, SVG-RCA, SVG-OM.  2. Diastolic CHF: Echo ([id number]) showed EF 55-65%, mild LVH, diastolic dysfunction, mild AS with mean gradient 12 mmHg, PASP 43 mmHg. Echo (2/12): EF 55-60%, mild LVH, mild AS (mean gradient 12), PA systolic pressure 32 mmHg.  3. CKD: Last creatinine 1.77.  4. DM2  5. HTN  6. Hyperlipidemia: nausea/GI discomfort with atorvastatin, ? Balance problems with pravastatin.  7. Anxiety  8. Chronic low back pain  9. History of thrombocytopenia  10. Anemia: Followed by hematology, getting Aranesp injections 11. Mild aortic stenosis: mean gradient 12 mmHg in 2/12.  12. Carotid stenosis: 40-59% bilateral ICA stenosis in 2/12.  13. PAD: ABIs 3/12 were normal but TBIs  were mildly decreased.   Family History:  Pt was adopted, does not know biological family's history.   Social History:  Married, lives in [place]. Retired from [employer]. Quit smoking around 1990. Occasional ETOH. Has 1 son.   Review of Systems  All systems reviewed and negative except as per HPI.   Current Outpatient Prescriptions  Medication Sig Dispense Refill  . ALPRAZolam (XANAX) 0.5 MG tablet Take 0.5 mg by mouth daily.      [name] aspirin 81 MG tablet Take 81 mg by mouth daily.       . cyanocobalamin 1000 MCG tablet Take 100 mcg by mouth daily.      . metFORMIN (GLUCOPHAGE) 1000 MG tablet Take 1,000 mg by mouth 2 (two) times daily with a meal.      . metoprolol succinate (TOPROL-XL) 50 MG 24 hr tablet Take 50 mg by mouth 2 (two) times daily. Take with or immediately following a meal.      . Multiple Vitamin (MULTIVITAMIN) capsule Take 1 capsule by mouth daily.      . niacin (NIASPAN) 1000 MG CR tablet Take 1,000 mg by mouth at bedtime. Patient takes 2 at bedtime      . omeprazole (PRILOSEC) 20 MG capsule Take 20 mg by mouth daily.      . timolol (BETIMOL) 0.5 % ophthalmic solution 1 drop 2 (two) times daily.      [name] amLODipine (NORVASC) 10 MG tablet  Take 1 tablet (10 mg total) by mouth daily.  30 tablet  6  . rosuvastatin (CRESTOR) 10 MG tablet Take 1 tablet (10 mg total) by mouth at bedtime.  30 tablet  3    BP 152/80  Pulse 59  Ht 5\' 8"  (1.727 m)  Wt 138 lb (62.596 kg)  BMI 20.98 kg/m2 General: NAD Neck: No JVD, no thyromegaly or thyroid nodule.  Lungs: Clear to auscultation bilaterally with normal respiratory effort. CV: Nondisplaced PMI.  Heart regular S1/S2, no S3/S4, 2/6 SEM RUSB.  1+ edema R>L ankle.  Left carotid bruit.  Unable to palpate pedal pulses but feet warm.  Abdomen: Soft, nontender, no hepatosplenomegaly, no distention.  Neurologic: Alert and oriented x 3.  Psych: Normal affect. Extremities: No clubbing or cyanosis.

## 2011-10-24 NOTE — Assessment & Plan Note (Signed)
Status post CABG.  No ischemic symptoms.  Continue ASA 81 and Toprol XL.  Needs to restart a statin: I am not going to put her back on simvastatin given use of amlodipine.  She has not tolerated atorvastatin.  I will start her on Crestor 10 mg daily with lipids/LFTs in 2 months.

## 2011-10-24 NOTE — Patient Instructions (Signed)
Increase amlodipine to 10mg  daily.  Take and record your blood pressure. I will call you in 2 weeks to get the readings. Desiree Lucy, RN [phone]  Start Crestor 10mg  daily.  Your physician recommends that you return for a FASTING lipid profile /liver profile/BMET in 2 months--414.05  401.9   Your physician wants you to follow-up in: 6 months with Dr Aundra Dubin. (September 2013) .You will receive a reminder letter in the mail two months in advance. If you don't receive a letter, please call our office to schedule the follow-up appointment.

## 2011-10-24 NOTE — Telephone Encounter (Signed)
New msg Pt was here today. She wants to talk to you about amlodipine. Please call

## 2011-10-24 NOTE — Telephone Encounter (Signed)
Discussed with pt. She will stop Toprol and start Coreg 12.5mg  twice a day.

## 2011-10-24 NOTE — Assessment & Plan Note (Signed)
Mild aortic stenosis.  Stable murmur.

## 2011-10-24 NOTE — Assessment & Plan Note (Signed)
Patient has occasional pain in her calves with walking.  ABIs were normal when checked in 3/12.  TBIs were only mildly abnormal and not at risk for tissue loss.  Treat medically for now with ASA and as above, restarting statin.

## 2011-10-27 ENCOUNTER — Ambulatory Visit (HOSPITAL_BASED_OUTPATIENT_CLINIC_OR_DEPARTMENT_OTHER): Payer: Medicare Other

## 2011-10-27 ENCOUNTER — Other Ambulatory Visit (HOSPITAL_BASED_OUTPATIENT_CLINIC_OR_DEPARTMENT_OTHER): Payer: Medicare Other | Admitting: Lab

## 2011-10-27 DIAGNOSIS — D649 Anemia, unspecified: Secondary | ICD-10-CM

## 2011-10-27 DIAGNOSIS — I10 Essential (primary) hypertension: Secondary | ICD-10-CM

## 2011-10-27 DIAGNOSIS — E118 Type 2 diabetes mellitus with unspecified complications: Secondary | ICD-10-CM

## 2011-10-27 DIAGNOSIS — I739 Peripheral vascular disease, unspecified: Secondary | ICD-10-CM

## 2011-10-27 DIAGNOSIS — E785 Hyperlipidemia, unspecified: Secondary | ICD-10-CM

## 2011-10-27 DIAGNOSIS — K219 Gastro-esophageal reflux disease without esophagitis: Secondary | ICD-10-CM

## 2011-10-27 DIAGNOSIS — N289 Disorder of kidney and ureter, unspecified: Secondary | ICD-10-CM

## 2011-10-27 DIAGNOSIS — I359 Nonrheumatic aortic valve disorder, unspecified: Secondary | ICD-10-CM

## 2011-10-27 DIAGNOSIS — G4700 Insomnia, unspecified: Secondary | ICD-10-CM

## 2011-10-27 DIAGNOSIS — I5032 Chronic diastolic (congestive) heart failure: Secondary | ICD-10-CM

## 2011-10-27 DIAGNOSIS — D696 Thrombocytopenia, unspecified: Secondary | ICD-10-CM

## 2011-10-27 DIAGNOSIS — I5040 Unspecified combined systolic (congestive) and diastolic (congestive) heart failure: Secondary | ICD-10-CM

## 2011-10-27 DIAGNOSIS — F411 Generalized anxiety disorder: Secondary | ICD-10-CM

## 2011-10-27 DIAGNOSIS — M545 Low back pain, unspecified: Secondary | ICD-10-CM

## 2011-10-27 DIAGNOSIS — I2510 Atherosclerotic heart disease of native coronary artery without angina pectoris: Secondary | ICD-10-CM

## 2011-10-27 DIAGNOSIS — I6529 Occlusion and stenosis of unspecified carotid artery: Secondary | ICD-10-CM

## 2011-10-27 DIAGNOSIS — N189 Chronic kidney disease, unspecified: Secondary | ICD-10-CM

## 2011-10-27 LAB — CBC WITH DIFFERENTIAL/PLATELET
BASO%: 0.3 % (ref 0.0–2.0)
Eosinophils Absolute: 0.2 10*3/uL (ref 0.0–0.5)
HCT: 30.2 % — ABNORMAL LOW (ref 34.8–46.6)
LYMPH%: 21.6 % (ref 14.0–49.7)
MCHC: 34.9 g/dL (ref 31.5–36.0)
MCV: 102.6 fL — ABNORMAL HIGH (ref 79.5–101.0)
MONO#: 0.5 10*3/uL (ref 0.1–0.9)
MONO%: 10.5 % (ref 0.0–14.0)
NEUT%: 63.9 % (ref 38.4–76.8)
Platelets: 93 10*3/uL — ABNORMAL LOW (ref 145–400)
RBC: 2.94 10*6/uL — ABNORMAL LOW (ref 3.70–5.45)
WBC: 5 10*3/uL (ref 3.9–10.3)

## 2011-10-27 MED ORDER — DARBEPOETIN ALFA-POLYSORBATE 300 MCG/0.6ML IJ SOLN
300.0000 ug | Freq: Once | INTRAMUSCULAR | Status: AC
  Administered 2011-10-27: 300 ug via SUBCUTANEOUS
  Filled 2011-10-27: qty 0.6

## 2011-10-27 NOTE — Patient Instructions (Signed)
Pt discharged home ambulatory.  Pt and husband informed of platelets being slightly low.  Instructions given to call for bleeding, oozing of blood from gums or nose, acute headaches etcetera. Pt and spouse verbalized understanding.  Pt to call for any other questions/concerns

## 2011-11-07 ENCOUNTER — Telehealth: Payer: Self-pay | Admitting: Cardiology

## 2011-11-07 NOTE — Telephone Encounter (Signed)
Desiree Lucy, RN 10/24/2011 1:51 PM Signed  Discussed with pt. She will stop Toprol and start Coreg 12.5mg  twice a day. Loralie Champagne, MD 10/24/2011 12:46 PM Signed  Stop Toprol XL and take Coreg 12.5 mg bid. BP check in 2 wks.  Desiree Lucy, RN 10/24/2011 11:24 AM Signed  I talked with pt. Dr Aundra Dubin was going to increase amlodipine to 10mg  daily today. Pt states she is already taking amlodipine 10mg  daily. I will forward to Dr Aundra Dubin for review and recommendations.  11/07/11--recent BP readings-- 10/25/11--121/65 74   10/27/11 125/62 74  10/28/11 120/68 72  10/29/11 113/61 76  10/30/11 113/64 75  10/31/11 113/64 70 3/19 13 119/60 68   11/02/11 124/71 72    11/03/11 115/60 68    11/04/11 116/56 76   11/05/11 112/60 71--. Pt started Coreg 12.5mg  bid instead of Toprol 10/25/11. I will forward to Dr Aundra Dubin for review.

## 2011-11-07 NOTE — Telephone Encounter (Signed)
Discussed with pt

## 2011-11-07 NOTE — Telephone Encounter (Signed)
New Msg: Pt calling wanting to speak with nurse/MD regarding pt crestor. Please return pt call to discuss further.

## 2011-11-07 NOTE — Telephone Encounter (Signed)
She is aware of Dr Claris Gladden recommendation to restart Crestor

## 2011-11-07 NOTE — Telephone Encounter (Signed)
I talked with pt. Pt concerned because one of the side effects of  Crestor in the literature she received from the pharmacy  is foamy urine. Pt started Crestor on 3/11/3 and took it until  11/03/11. Pt noticed her urine was foamy/bubbly 11/04/11 and stopped Crestor.  Pt also is on Cipro now for UTI. Pt started Cipro 11/02/11 and is supposed to take it  until 11/08/11. Pt concerned because she has a history of kidney problems. She did not notice foamy/bubbly urine before starting Crestor but she states she really was not looking for that. Pt asking if OK to continue Crestor. I will forward to Dr Aundra Dubin for review.

## 2011-11-07 NOTE — Telephone Encounter (Signed)
BP looks good.  Would restart Crestor, urinary changes may be from UTI.

## 2011-11-17 ENCOUNTER — Ambulatory Visit (HOSPITAL_BASED_OUTPATIENT_CLINIC_OR_DEPARTMENT_OTHER): Payer: Medicare Other

## 2011-11-17 ENCOUNTER — Telehealth: Payer: Self-pay | Admitting: Cardiology

## 2011-11-17 ENCOUNTER — Other Ambulatory Visit (HOSPITAL_BASED_OUTPATIENT_CLINIC_OR_DEPARTMENT_OTHER): Payer: Medicare Other | Admitting: Lab

## 2011-11-17 VITALS — BP 117/64 | HR 74 | Temp 96.7°F

## 2011-11-17 DIAGNOSIS — I739 Peripheral vascular disease, unspecified: Secondary | ICD-10-CM

## 2011-11-17 DIAGNOSIS — E785 Hyperlipidemia, unspecified: Secondary | ICD-10-CM

## 2011-11-17 DIAGNOSIS — I10 Essential (primary) hypertension: Secondary | ICD-10-CM

## 2011-11-17 DIAGNOSIS — M545 Low back pain, unspecified: Secondary | ICD-10-CM

## 2011-11-17 DIAGNOSIS — I6529 Occlusion and stenosis of unspecified carotid artery: Secondary | ICD-10-CM

## 2011-11-17 DIAGNOSIS — K219 Gastro-esophageal reflux disease without esophagitis: Secondary | ICD-10-CM

## 2011-11-17 DIAGNOSIS — D696 Thrombocytopenia, unspecified: Secondary | ICD-10-CM

## 2011-11-17 DIAGNOSIS — D649 Anemia, unspecified: Secondary | ICD-10-CM

## 2011-11-17 DIAGNOSIS — I2510 Atherosclerotic heart disease of native coronary artery without angina pectoris: Secondary | ICD-10-CM

## 2011-11-17 DIAGNOSIS — G4700 Insomnia, unspecified: Secondary | ICD-10-CM

## 2011-11-17 DIAGNOSIS — E118 Type 2 diabetes mellitus with unspecified complications: Secondary | ICD-10-CM

## 2011-11-17 DIAGNOSIS — I5040 Unspecified combined systolic (congestive) and diastolic (congestive) heart failure: Secondary | ICD-10-CM

## 2011-11-17 DIAGNOSIS — F411 Generalized anxiety disorder: Secondary | ICD-10-CM

## 2011-11-17 DIAGNOSIS — I359 Nonrheumatic aortic valve disorder, unspecified: Secondary | ICD-10-CM

## 2011-11-17 DIAGNOSIS — N289 Disorder of kidney and ureter, unspecified: Secondary | ICD-10-CM

## 2011-11-17 DIAGNOSIS — N189 Chronic kidney disease, unspecified: Secondary | ICD-10-CM

## 2011-11-17 DIAGNOSIS — I5032 Chronic diastolic (congestive) heart failure: Secondary | ICD-10-CM

## 2011-11-17 LAB — CBC WITH DIFFERENTIAL/PLATELET
BASO%: 0.7 % (ref 0.0–2.0)
EOS%: 3.8 % (ref 0.0–7.0)
HCT: 33 % — ABNORMAL LOW (ref 34.8–46.6)
LYMPH%: 25.9 % (ref 14.0–49.7)
MCH: 35.3 pg — ABNORMAL HIGH (ref 25.1–34.0)
MCHC: 33.2 g/dL (ref 31.5–36.0)
NEUT%: 55.8 % (ref 38.4–76.8)
Platelets: 68 10*3/uL — ABNORMAL LOW (ref 145–400)
RBC: 3.1 10*6/uL — ABNORMAL LOW (ref 3.70–5.45)
nRBC: 0 % (ref 0–0)

## 2011-11-17 MED ORDER — DARBEPOETIN ALFA-POLYSORBATE 300 MCG/0.6ML IJ SOLN
300.0000 ug | Freq: Once | INTRAMUSCULAR | Status: AC
  Administered 2011-11-17: 300 ug via SUBCUTANEOUS

## 2011-11-17 NOTE — Telephone Encounter (Signed)
New msg Pt called and saying she is having swelling in ankles and she is taking coreg. Please call her back

## 2011-11-17 NOTE — Patient Instructions (Signed)
Pt and spouse in.  Pt without complaints.  Injection given.  Pt discharged home ambulatory with spouse's asisstance.

## 2011-11-17 NOTE — Telephone Encounter (Signed)
Spoke with pt. She is aware of Dr Claris Gladden recommendations. Pt declined appt at this time. She will monitor symptoms and call if changes.

## 2011-11-17 NOTE — Telephone Encounter (Signed)
Coreg is unlikely to have caused the swelling.  Could be amlodipine.  Would keep feet elevated, avoid salt.  Would be glad to see her in office or have her see Scott. If she is not more short of breath, would not start Lasix without seeing her in the office.

## 2011-11-17 NOTE — Telephone Encounter (Signed)
Spoke with pt. Pt states she noticed her ankles were swollen Tuesday night but not so much she could not wear her shoes. She states she had not noticed this before. Her ankles are still swollen today. She states she may be up a couple of pounds but she has not been weighing regularly so she isn't sure it this is accurate. She denies increase in SOB,bloating or swelling in hands,  face or lips.She is concerned because she read in the prescription information given to her that coreg could cause swelling specifically  in her ankles. She also takes amlodipine but she does not feel the swelling in her ankles is related to amlodipine. I will forward to Dr Aundra Dubin for review and recommendations.

## 2011-12-07 ENCOUNTER — Ambulatory Visit (HOSPITAL_BASED_OUTPATIENT_CLINIC_OR_DEPARTMENT_OTHER): Payer: Medicare Other | Admitting: Oncology

## 2011-12-07 ENCOUNTER — Telehealth: Payer: Self-pay | Admitting: Oncology

## 2011-12-07 ENCOUNTER — Other Ambulatory Visit: Payer: Medicare Other | Admitting: Lab

## 2011-12-07 ENCOUNTER — Ambulatory Visit: Payer: Medicare Other

## 2011-12-07 VITALS — BP 135/69 | HR 60 | Temp 96.7°F | Ht 68.0 in | Wt 143.6 lb

## 2011-12-07 DIAGNOSIS — E118 Type 2 diabetes mellitus with unspecified complications: Secondary | ICD-10-CM

## 2011-12-07 DIAGNOSIS — I5032 Chronic diastolic (congestive) heart failure: Secondary | ICD-10-CM

## 2011-12-07 DIAGNOSIS — K219 Gastro-esophageal reflux disease without esophagitis: Secondary | ICD-10-CM

## 2011-12-07 DIAGNOSIS — M545 Low back pain, unspecified: Secondary | ICD-10-CM

## 2011-12-07 DIAGNOSIS — E785 Hyperlipidemia, unspecified: Secondary | ICD-10-CM

## 2011-12-07 DIAGNOSIS — G4700 Insomnia, unspecified: Secondary | ICD-10-CM

## 2011-12-07 DIAGNOSIS — D649 Anemia, unspecified: Secondary | ICD-10-CM

## 2011-12-07 DIAGNOSIS — I739 Peripheral vascular disease, unspecified: Secondary | ICD-10-CM

## 2011-12-07 DIAGNOSIS — I6529 Occlusion and stenosis of unspecified carotid artery: Secondary | ICD-10-CM

## 2011-12-07 DIAGNOSIS — D696 Thrombocytopenia, unspecified: Secondary | ICD-10-CM

## 2011-12-07 DIAGNOSIS — F411 Generalized anxiety disorder: Secondary | ICD-10-CM

## 2011-12-07 DIAGNOSIS — N189 Chronic kidney disease, unspecified: Secondary | ICD-10-CM

## 2011-12-07 DIAGNOSIS — I10 Essential (primary) hypertension: Secondary | ICD-10-CM

## 2011-12-07 DIAGNOSIS — I359 Nonrheumatic aortic valve disorder, unspecified: Secondary | ICD-10-CM

## 2011-12-07 DIAGNOSIS — I5040 Unspecified combined systolic (congestive) and diastolic (congestive) heart failure: Secondary | ICD-10-CM

## 2011-12-07 DIAGNOSIS — I2510 Atherosclerotic heart disease of native coronary artery without angina pectoris: Secondary | ICD-10-CM

## 2011-12-07 LAB — CBC WITH DIFFERENTIAL/PLATELET
BASO%: 0.7 % (ref 0.0–2.0)
Basophils Absolute: 0 10*3/uL (ref 0.0–0.1)
EOS%: 4.6 % (ref 0.0–7.0)
MCH: 35 pg — ABNORMAL HIGH (ref 25.1–34.0)
MCHC: 32.9 g/dL (ref 31.5–36.0)
MCV: 106.2 fL — ABNORMAL HIGH (ref 79.5–101.0)
MONO%: 12.8 % (ref 0.0–14.0)
RBC: 3.38 10*6/uL — ABNORMAL LOW (ref 3.70–5.45)
RDW: 13.4 % (ref 11.2–14.5)

## 2011-12-07 MED ORDER — DARBEPOETIN ALFA-POLYSORBATE 300 MCG/0.6ML IJ SOLN: 300.0000 ug | Freq: Once | INTRAMUSCULAR | Status: DC

## 2011-12-07 NOTE — Progress Notes (Signed)
Hematology and Oncology Follow Up Visit  MARCUS [name] [id number] 04-Oct-1940 71 y.o. 12/07/2011 9:52 AM  CC: [name], M.D.    Principle Diagnosis: This is a 71 year old female with multifactorial anemia.  She has anemia of chronic disease, anemia of renal disease, diagnosed in 2011.   Current therapy:  Aranesp 300 mcg subcutaneously every 3 weeks keeping hemoglobin above 11.  Interim History: [name] presents today for a followup visit.  This is a very pleasant woman with a few comorbid conditions and history of renal insufficiency with a diagnosis of chronic anemia. She has been on Aranesp and have helped her overall quality of life and improvement in her performance status.  She had really reported some increase in her energy, increase in her appetite.  She still has some balance issues.  She has difficulty maintaining balance at times although she did not have any falls and at times requires assistance from her husband for certain activities of daily living.   She has not had any problems associated with Aranesp.  She had not had any injection or infusion problem and denied any toxicities.  Medications: I have reviewed the patient's current medications. Current outpatient prescriptions:ALPRAZolam (XANAX) 0.5 MG tablet, Take 0.5 mg by mouth daily., Disp: , Rfl: ;  amLODipine (NORVASC) 10 MG tablet, Take 1 tablet (10 mg total) by mouth daily., Disp: 30 tablet, Rfl: 6;  aspirin 81 MG tablet, Take 81 mg by mouth daily. , Disp: , Rfl: ;  carvedilol (COREG) 12.5 MG tablet, Take 1 tablet (12.5 mg total) by mouth 2 (two) times daily., Disp: 60 tablet, Rfl: 6 cyanocobalamin 1000 MCG tablet, Take 100 mcg by mouth daily., Disp: , Rfl: ;  metFORMIN (GLUCOPHAGE) 1000 MG tablet, Take 1,000 mg by mouth 2 (two) times daily with a meal., Disp: , Rfl: ;  Multiple Vitamin (MULTIVITAMIN) capsule, Take 1 capsule by mouth daily., Disp: , Rfl: ;  niacin (NIASPAN) 1000 MG CR tablet, Take 1,000 mg by mouth at  bedtime. Patient takes 2 at bedtime, Disp: , Rfl:  omeprazole (PRILOSEC) 20 MG capsule, Take 20 mg by mouth daily., Disp: , Rfl: ;  rosuvastatin (CRESTOR) 10 MG tablet, Take 1 tablet (10 mg total) by mouth at bedtime., Disp: 30 tablet, Rfl: 3;  timolol (BETIMOL) 0.5 % ophthalmic solution, 1 drop 2 (two) times daily., Disp: , Rfl: ;  DISCONTD: metoprolol succinate (TOPROL-XL) 50 MG 24 hr tablet, Take 50 mg by mouth 2 (two) times daily. Take with or immediately following a meal., Disp: , Rfl:   Allergies: No Known Allergies  Past Medical History, Surgical history, Social history, and Family History were reviewed and updated.  Review of Systems: Constitutional:  Negative for fever, chills, night sweats, anorexia, weight loss, pain. Cardiovascular: no chest pain or dyspnea on exertion Respiratory: no cough, shortness of breath, or wheezing Neurological: no TIA or stroke symptoms Dermatological: negative ENT: negative Skin: Negative. Gastrointestinal: no abdominal pain, change in bowel habits, or black or bloody stools Genito-Urinary: no dysuria, trouble voiding, or hematuria Hematological and Lymphatic: negative Breast: negative Musculoskeletal: negative Remaining ROS negative. Physical Exam: Blood pressure 135/69, pulse 60, temperature 96.7 F (35.9 C), temperature source Oral, height 5\' 8"  (1.727 m), weight 143 lb 9.6 oz (65.137 kg). ECOG: 1 General appearance: alert Head: Normocephalic, without obvious abnormality, atraumatic Neck: no adenopathy, no carotid bruit, no JVD, supple, symmetrical, trachea midline and thyroid not enlarged, symmetric, no tenderness/mass/nodules Lymph nodes: Cervical, supraclavicular, and axillary nodes normal. Heart:regular rate and  rhythm, S1, S2 normal, no murmur, click, rub or gallop Lung:chest clear, no wheezing, rales, normal symmetric air entry Abdomin: soft, non-tender, without masses or organomegaly EXT:no erythema, induration, or nodules   Lab  Results: Lab Results  Component Value Date   WBC 4.2 12/07/2011   HGB 11.8 12/07/2011   HCT 35.8 12/07/2011   MCV 106.2* 12/07/2011   PLT 68* 12/07/2011      Impression and Plan:  A 70 year old female with the following issues:  1. 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  We will keep her on Aranesp, keep her hemoglobin above 11. She will not get Aranesp today.  2.     Thrombocytopenia.  Her platelet counts are stable. No major bleeding at this time. However, we will continue to monitor her blood counts closely.  The likelihood of possibly developing myelodysplastic syndrome is always a possibility.    [facility], MD 4/24/20139:52 AM

## 2011-12-07 NOTE — Telephone Encounter (Signed)
appts made and printed for pt aom °

## 2011-12-08 ENCOUNTER — Encounter: Payer: Self-pay | Admitting: *Deleted

## 2011-12-26 ENCOUNTER — Other Ambulatory Visit (INDEPENDENT_AMBULATORY_CARE_PROVIDER_SITE_OTHER): Payer: Medicare Other

## 2011-12-26 ENCOUNTER — Other Ambulatory Visit: Payer: Self-pay | Admitting: Oncology

## 2011-12-26 DIAGNOSIS — I25810 Atherosclerosis of coronary artery bypass graft(s) without angina pectoris: Secondary | ICD-10-CM

## 2011-12-26 DIAGNOSIS — I10 Essential (primary) hypertension: Secondary | ICD-10-CM

## 2011-12-26 LAB — LIPID PANEL
Cholesterol: 90 mg/dL (ref 0–200)
LDL Cholesterol: 18 mg/dL (ref 0–99)

## 2011-12-26 LAB — HEPATIC FUNCTION PANEL
AST: 23 U/L (ref 0–37)
Alkaline Phosphatase: 75 U/L (ref 39–117)
Bilirubin, Direct: 0.1 mg/dL (ref 0.0–0.3)
Total Protein: 7.1 g/dL (ref 6.0–8.3)

## 2011-12-26 LAB — BASIC METABOLIC PANEL
BUN: 27 mg/dL — ABNORMAL HIGH (ref 6–23)
CO2: 20 mEq/L (ref 19–32)
Chloride: 101 mEq/L (ref 96–112)
Creatinine, Ser: 1.9 mg/dL — ABNORMAL HIGH (ref 0.4–1.2)

## 2011-12-27 ENCOUNTER — Other Ambulatory Visit: Payer: Self-pay | Admitting: *Deleted

## 2011-12-27 DIAGNOSIS — I25810 Atherosclerosis of coronary artery bypass graft(s) without angina pectoris: Secondary | ICD-10-CM

## 2011-12-27 MED ORDER — ROSUVASTATIN CALCIUM 5 MG PO TABS: 5.0000 mg | ORAL_TABLET | Freq: Every day | ORAL | Status: DC

## 2011-12-28 ENCOUNTER — Encounter: Payer: Self-pay | Admitting: Physician Assistant

## 2011-12-28 ENCOUNTER — Ambulatory Visit (INDEPENDENT_AMBULATORY_CARE_PROVIDER_SITE_OTHER): Payer: Medicare Other | Admitting: Physician Assistant

## 2011-12-28 ENCOUNTER — Other Ambulatory Visit: Payer: Medicare Other

## 2011-12-28 ENCOUNTER — Ambulatory Visit (HOSPITAL_BASED_OUTPATIENT_CLINIC_OR_DEPARTMENT_OTHER): Payer: Medicare Other

## 2011-12-28 VITALS — BP 115/61 | HR 61 | Ht 68.0 in | Wt 144.0 lb

## 2011-12-28 VITALS — BP 123/60 | HR 60 | Temp 96.8°F

## 2011-12-28 DIAGNOSIS — I6529 Occlusion and stenosis of unspecified carotid artery: Secondary | ICD-10-CM

## 2011-12-28 DIAGNOSIS — D696 Thrombocytopenia, unspecified: Secondary | ICD-10-CM

## 2011-12-28 DIAGNOSIS — M545 Low back pain, unspecified: Secondary | ICD-10-CM

## 2011-12-28 DIAGNOSIS — D649 Anemia, unspecified: Secondary | ICD-10-CM

## 2011-12-28 DIAGNOSIS — E118 Type 2 diabetes mellitus with unspecified complications: Secondary | ICD-10-CM

## 2011-12-28 DIAGNOSIS — G4700 Insomnia, unspecified: Secondary | ICD-10-CM

## 2011-12-28 DIAGNOSIS — I2510 Atherosclerotic heart disease of native coronary artery without angina pectoris: Secondary | ICD-10-CM

## 2011-12-28 DIAGNOSIS — I509 Heart failure, unspecified: Secondary | ICD-10-CM

## 2011-12-28 DIAGNOSIS — K219 Gastro-esophageal reflux disease without esophagitis: Secondary | ICD-10-CM

## 2011-12-28 DIAGNOSIS — I5032 Chronic diastolic (congestive) heart failure: Secondary | ICD-10-CM

## 2011-12-28 DIAGNOSIS — I10 Essential (primary) hypertension: Secondary | ICD-10-CM

## 2011-12-28 DIAGNOSIS — F411 Generalized anxiety disorder: Secondary | ICD-10-CM

## 2011-12-28 DIAGNOSIS — R609 Edema, unspecified: Secondary | ICD-10-CM

## 2011-12-28 DIAGNOSIS — E785 Hyperlipidemia, unspecified: Secondary | ICD-10-CM

## 2011-12-28 DIAGNOSIS — I5040 Unspecified combined systolic (congestive) and diastolic (congestive) heart failure: Secondary | ICD-10-CM

## 2011-12-28 DIAGNOSIS — I25810 Atherosclerosis of coronary artery bypass graft(s) without angina pectoris: Secondary | ICD-10-CM

## 2011-12-28 DIAGNOSIS — I739 Peripheral vascular disease, unspecified: Secondary | ICD-10-CM

## 2011-12-28 DIAGNOSIS — I359 Nonrheumatic aortic valve disorder, unspecified: Secondary | ICD-10-CM

## 2011-12-28 DIAGNOSIS — N189 Chronic kidney disease, unspecified: Secondary | ICD-10-CM

## 2011-12-28 LAB — CBC WITH DIFFERENTIAL/PLATELET
BASO%: 0.2 % (ref 0.0–2.0)
LYMPH%: 22.1 % (ref 14.0–49.7)
MCHC: 33.7 g/dL (ref 31.5–36.0)
MCV: 99 fL (ref 79.5–101.0)
MONO%: 12.7 % (ref 0.0–14.0)
Platelets: 75 10*3/uL — ABNORMAL LOW (ref 145–400)
RBC: 3 10*6/uL — ABNORMAL LOW (ref 3.70–5.45)
WBC: 5 10*3/uL (ref 3.9–10.3)
nRBC: 0 % (ref 0–0)

## 2011-12-28 MED ORDER — DARBEPOETIN ALFA-POLYSORBATE 300 MCG/0.6ML IJ SOLN
300.0000 ug | Freq: Once | INTRAMUSCULAR | Status: AC
  Administered 2011-12-28: 300 ug via SUBCUTANEOUS
  Filled 2011-12-28: qty 0.6

## 2011-12-28 NOTE — Assessment & Plan Note (Signed)
Patient has lower extremity edema that has worsened ever since increasing her Norvasc in March. Her blood pressure is stable today. I will decrease her Norvasc to 5 mg daily to see if there is improvement with this. I will hold off on a diuretic because there is no evidence of heart failure and her history of renal insufficiency. I will also order compression stockings and asked her to continue a 2 g sodium diet.

## 2011-12-28 NOTE — Assessment & Plan Note (Signed)
Patient's blood pressure is stable today. She will monitor it at home since we're decreasing her Norvasc. Continue low sodium diet.

## 2011-12-28 NOTE — Assessment & Plan Note (Signed)
There is no evidence of diastolic heart failure on exam today.

## 2011-12-28 NOTE — Progress Notes (Signed)
HPI:  A 71 year old white female patient of Dr. Algernon Huxley who comes in today because of bilateral ankle swelling. She says this has been going on for over a month. Looking back at the notes her amlodipine was increased to 10 mg in March for hypertension. She also had Coreg added. She denies any dyspnea, orthopnea, and is very good about monitoring her salt intake.  Patient has a history of coronary artery disease status post CABG in [id number], and diastolic heart failure ejection fraction 55-65% with mild LVH diastolic dysfunction and mild AS. Hydrochlorothiazide has been stopped in the past because of chronic renal insufficiency and a creatinine of 1.7  No Known Allergies  Current Outpatient Prescriptions on File Prior to Visit  Medication Sig Dispense Refill  . ALPRAZolam (XANAX) 0.5 MG tablet Take 0.5 mg by mouth daily.      [name] amLODipine (NORVASC) 10 MG tablet Take 1 tablet (10 mg total) by mouth daily.  30 tablet  6  . aspirin 81 MG tablet Take 81 mg by mouth daily.       . carvedilol (COREG) 12.5 MG tablet Take 1 tablet (12.5 mg total) by mouth 2 (two) times daily.  60 tablet  6  . cyanocobalamin 1000 MCG tablet Take 100 mcg by mouth daily.      . metFORMIN (GLUCOPHAGE) 1000 MG tablet Take 1,000 mg by mouth 2 (two) times daily with a meal.      . Multiple Vitamin (MULTIVITAMIN) capsule Take 1 capsule by mouth daily.      . niacin (NIASPAN) 1000 MG CR tablet Take 1,000 mg by mouth at bedtime.       [name] omeprazole (PRILOSEC) 20 MG capsule Take 20 mg by mouth daily. As needed      . rosuvastatin (CRESTOR) 5 MG tablet Take 1 tablet (5 mg total) by mouth daily.  90 tablet  3  . timolol (BETIMOL) 0.5 % ophthalmic solution 1 drop 2 (two) times daily.      [name] DISCONTD: metoprolol succinate (TOPROL-XL) 50 MG 24 hr tablet Take 50 mg by mouth 2 (two) times daily. Take with or immediately following a meal.        Past Medical History  Diagnosis Date  . Hypertension   . Hyperlipidemia   . Diabetes mellitus    . Coronary artery disease     Pt presented 2/10 to [facility] with NSTEMI and diastolic CHF exacerbation.  LHC was done  3/10 showing 99% pRCA stenosis and 80% calcified pLAD stenosis with L=>R collaterals.  Pt was referred  for CABG which was done by Dr. Prescott Gum with LIMA-LAD, SVG-RCA, SVG-OM.  [name] CKD (chronic kidney disease)      Last creatinine 1.4.  . Anxiety   . Chronic low back pain   . Anemia     Pt is taking iron.   . Mild aortic stenosis     mean gradient 12 mmHg in 2/12.  . Thrombocytopenia   . Carotid stenosis     40-59% bilateral ICA stenosis in 2/12.  . Diastolic CHF     Echo ([id number]) showed EF 55-65%, mild LVH, diastolic dysfunction, mild AS with mean gradient 12 mmHg, PASP 43 mmHg.  Echo (2/12): EF 55-60%, mild LVH, mild AS (mean gradient 12), PA systolic pressure 32 mmHg.       Past Surgical History  Procedure Date  . Coronary artery bypass graft 09/2008    pt with NSTEMI and diastolic CHF exacerbation.  LHC was done  3/10 showing 99% pRCA stenosis and 80% calcified pLAD stenosis with L=>R collaterals.  Pt was referred  for CABG which was done by Dr. Prescott Gum with LIMA-LAD, SVG-RCA, SVG-OM.  [name] Back surgery     multiple    Family History  Problem Relation Age of Onset  . Adopted: Yes    History   Social History  . Marital Status: Married    Spouse Name: N/A    Number of Children: N/A  . Years of Education: N/A   Occupational History  . Not on file.   Social History Main Topics  . Smoking status: Former Smoker    Types: Cigarettes  . Smokeless tobacco: Former Systems developer    Quit date: 08/15/1988   Comment: quit 1988  . Alcohol Use: No  . Drug Use: No  . Sexually Active: Not on file   Other Topics Concern  . Not on file   Social History Narrative  . No narrative on file    ROS:see history of present illness otherwise negative   PHYSICAL EXAM: Well-nournished, in no acute distress. Neck: Bilateral carotid bruits,No JVD, HJR, or thyroid  enlargement Lungs: No tachypnea, clear without wheezing, rales, or rhonchi Cardiovascular: RRR, PMI not displaced, [id number] harsh systolic murmur at the left sternal border radiating to the carotids, no bruit, thrill, or heave. Abdomen: BS normal. Soft without organomegaly, masses, lesions or tenderness. 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

## 2011-12-28 NOTE — Patient Instructions (Signed)
Your physician has recommended you make the following change in your medication: DECREASE Amlodipine to 5mg  take one by mouth daily  Your physician recommends that you schedule a follow-up appointment in: 2 WEEKS with Richardson Dopp PA-C  Order for TED hose given to patient.   2 Gram Low Sodium Diet A 2 gram sodium diet restricts the amount of sodium in the diet to no more than 2 g or 2000 mg daily. Limiting the amount of sodium is often used to help lower blood pressure. It is important if you have heart, liver, or kidney problems. Many foods contain sodium for flavor and sometimes as a preservative. When the amount of sodium in a diet needs to be low, it is important to know what to look for when choosing foods and drinks. The following includes some information and guidelines to help make it easier for you to adapt to a low sodium diet. QUICK TIPS  Do not add salt to food.   Avoid convenience items and fast food.   Choose unsalted snack foods.   Buy lower sodium products, often labeled as "lower sodium" or "no salt added."   Check food labels to learn how much sodium is in 1 serving.   When eating at a restaurant, ask that your food be prepared with less salt or none, if possible.  READING FOOD LABELS FOR SODIUM INFORMATION The nutrition facts label is a good place to find how much sodium is in foods. Look for products with no more than 500 to 600 mg of sodium per meal and no more than 150 mg per serving. Remember that 2 g = 2000 mg. The food label may also list foods as:  Sodium-free: Less than 5 mg in a serving.   Very low sodium: 35 mg or less in a serving.   Low-sodium: 140 mg or less in a serving.   Light in sodium: 50% less sodium in a serving. For example, if a food that usually has 300 mg of sodium is changed to become light in sodium, it will have 150 mg of sodium.   Reduced sodium: 25% less sodium in a serving. For example, if a food that usually has 400 mg of sodium is  changed to reduced sodium, it will have 300 mg of sodium.  CHOOSING FOODS Grains  Avoid: Salted crackers and snack items. Some cereals, including instant hot cereals. Bread stuffing and biscuit mixes. Seasoned rice or pasta mixes.   Choose: Unsalted snack items. Low-sodium cereals, oats, puffed wheat and rice, shredded wheat. English muffins and bread. Pasta.  Meats  Avoid: Salted, canned, smoked, spiced, pickled meats, including fish and poultry. Bacon, ham, sausage, cold cuts, hot dogs, anchovies.   Choose: Low-sodium canned tuna and salmon. Fresh or frozen meat, poultry, and fish.  Dairy  Avoid: Processed cheese and spreads. Cottage cheese. Buttermilk and condensed milk. Regular cheese.   Choose: Milk. Low-sodium cottage cheese. Yogurt. Sour cream. Low-sodium cheese.  Fruits and Vegetables  Avoid: Regular canned vegetables. Regular canned tomato sauce and paste. Frozen vegetables in sauces. Olives. Angie Fava. Relishes. Sauerkraut.   Choose: Low-sodium canned vegetables. Low-sodium tomato sauce and paste. Frozen or fresh vegetables. Fresh and frozen fruit.  Condiments  Avoid: Canned and packaged gravies. Worcestershire sauce. Tartar sauce. Barbecue sauce. Soy sauce. Steak sauce. Ketchup. Onion, garlic, and table salt. Meat flavorings and tenderizers.   Choose: Fresh and dried herbs and spices. Low-sodium varieties of mustard and ketchup. Lemon juice. Tabasco sauce. Horseradish.  SAMPLE 2 GRAM  SODIUM MEAL PLAN Breakfast / Sodium (mg)  1 cup low-fat milk / [id number] mg   2 slices whole-wheat toast / 270 mg   1 tbs heart-healthy margarine / 153 mg   1 hard-boiled egg / 139 mg   1 small orange / 0 mg  Lunch / Sodium (mg)  1 cup raw carrots / 76 mg    cup hummus / 298 mg   1 cup low-fat milk / 143 mg    cup red grapes / 2 mg   1 whole-wheat pita bread / 356 mg  Dinner / Sodium (mg)  1 cup whole-wheat pasta / 2 mg   1 cup low-sodium tomato sauce / 73 mg   3 oz lean  ground beef / 57 mg   1 small side salad (1 cup raw spinach leaves,  cup cucumber,  cup yellow bell pepper) with 1 tsp olive oil and 1 tsp red wine vinegar / 25 mg  Snack / Sodium (mg)  1 container low-fat vanilla yogurt / 107 mg   3 graham cracker squares / 127 mg  Nutrient Analysis  Calories: 2033   Protein: 77 g   Carbohydrate: 282 g   Fat: 72 g   Sodium: 1971 mg  Document Released: 08/01/2005 Document Revised: 07/21/2011 Document Reviewed: 11/02/2009 [facility] Patient Information 2012 [place], [place].

## 2011-12-28 NOTE — Assessment & Plan Note (Signed)
Carotid Dopplers checked in March and are stable

## 2012-01-11 ENCOUNTER — Ambulatory Visit: Payer: Medicare Other | Admitting: Physician Assistant

## 2012-01-18 ENCOUNTER — Other Ambulatory Visit (HOSPITAL_BASED_OUTPATIENT_CLINIC_OR_DEPARTMENT_OTHER): Payer: Medicare Other | Admitting: Lab

## 2012-01-18 ENCOUNTER — Ambulatory Visit: Payer: Medicare Other

## 2012-01-18 DIAGNOSIS — D649 Anemia, unspecified: Secondary | ICD-10-CM

## 2012-01-18 DIAGNOSIS — I5040 Unspecified combined systolic (congestive) and diastolic (congestive) heart failure: Secondary | ICD-10-CM

## 2012-01-18 DIAGNOSIS — I6529 Occlusion and stenosis of unspecified carotid artery: Secondary | ICD-10-CM

## 2012-01-18 DIAGNOSIS — F411 Generalized anxiety disorder: Secondary | ICD-10-CM

## 2012-01-18 DIAGNOSIS — K219 Gastro-esophageal reflux disease without esophagitis: Secondary | ICD-10-CM

## 2012-01-18 DIAGNOSIS — G4700 Insomnia, unspecified: Secondary | ICD-10-CM

## 2012-01-18 DIAGNOSIS — D696 Thrombocytopenia, unspecified: Secondary | ICD-10-CM

## 2012-01-18 DIAGNOSIS — I359 Nonrheumatic aortic valve disorder, unspecified: Secondary | ICD-10-CM

## 2012-01-18 DIAGNOSIS — I739 Peripheral vascular disease, unspecified: Secondary | ICD-10-CM

## 2012-01-18 DIAGNOSIS — E785 Hyperlipidemia, unspecified: Secondary | ICD-10-CM

## 2012-01-18 DIAGNOSIS — I5032 Chronic diastolic (congestive) heart failure: Secondary | ICD-10-CM

## 2012-01-18 DIAGNOSIS — I2510 Atherosclerotic heart disease of native coronary artery without angina pectoris: Secondary | ICD-10-CM

## 2012-01-18 DIAGNOSIS — M545 Low back pain, unspecified: Secondary | ICD-10-CM

## 2012-01-18 DIAGNOSIS — N189 Chronic kidney disease, unspecified: Secondary | ICD-10-CM

## 2012-01-18 DIAGNOSIS — I10 Essential (primary) hypertension: Secondary | ICD-10-CM

## 2012-01-18 DIAGNOSIS — E118 Type 2 diabetes mellitus with unspecified complications: Secondary | ICD-10-CM

## 2012-01-18 LAB — CBC WITH DIFFERENTIAL/PLATELET
Basophils Absolute: 0 10*3/uL (ref 0.0–0.1)
EOS%: 4.4 % (ref 0.0–7.0)
Eosinophils Absolute: 0.1 10*3/uL (ref 0.0–0.5)
HCT: 33.3 % — ABNORMAL LOW (ref 34.8–46.6)
HGB: 11 g/dL — ABNORMAL LOW (ref 11.6–15.9)
LYMPH%: 25.6 % (ref 14.0–49.7)
MCH: 34.8 pg — ABNORMAL HIGH (ref 25.1–34.0)
MCV: 105.2 fL — ABNORMAL HIGH (ref 79.5–101.0)
MONO%: 15.2 % — ABNORMAL HIGH (ref 0.0–14.0)
NEUT#: 1.8 10*3/uL (ref 1.5–6.5)
NEUT%: 54.2 % (ref 38.4–76.8)
Platelets: 64 10*3/uL — ABNORMAL LOW (ref 145–400)
RDW: 14.4 % (ref 11.2–14.5)

## 2012-01-18 MED ORDER — DARBEPOETIN ALFA-POLYSORBATE 300 MCG/0.6ML IJ SOLN: 300.0000 ug | Freq: Once | INTRAMUSCULAR | Status: DC

## 2012-02-08 ENCOUNTER — Other Ambulatory Visit (HOSPITAL_BASED_OUTPATIENT_CLINIC_OR_DEPARTMENT_OTHER): Payer: Medicare Other | Admitting: Lab

## 2012-02-08 ENCOUNTER — Ambulatory Visit (HOSPITAL_BASED_OUTPATIENT_CLINIC_OR_DEPARTMENT_OTHER): Payer: Medicare Other | Admitting: Oncology

## 2012-02-08 ENCOUNTER — Telehealth: Payer: Self-pay | Admitting: Oncology

## 2012-02-08 ENCOUNTER — Ambulatory Visit: Payer: Medicare Other

## 2012-02-08 ENCOUNTER — Encounter: Payer: Self-pay | Admitting: Oncology

## 2012-02-08 VITALS — BP 119/70 | HR 61 | Temp 96.7°F | Ht 68.0 in | Wt 140.3 lb

## 2012-02-08 DIAGNOSIS — D649 Anemia, unspecified: Secondary | ICD-10-CM

## 2012-02-08 DIAGNOSIS — M545 Low back pain, unspecified: Secondary | ICD-10-CM

## 2012-02-08 DIAGNOSIS — I2510 Atherosclerotic heart disease of native coronary artery without angina pectoris: Secondary | ICD-10-CM

## 2012-02-08 DIAGNOSIS — I10 Essential (primary) hypertension: Secondary | ICD-10-CM

## 2012-02-08 DIAGNOSIS — I6529 Occlusion and stenosis of unspecified carotid artery: Secondary | ICD-10-CM

## 2012-02-08 DIAGNOSIS — E785 Hyperlipidemia, unspecified: Secondary | ICD-10-CM

## 2012-02-08 DIAGNOSIS — I5032 Chronic diastolic (congestive) heart failure: Secondary | ICD-10-CM

## 2012-02-08 DIAGNOSIS — I5040 Unspecified combined systolic (congestive) and diastolic (congestive) heart failure: Secondary | ICD-10-CM

## 2012-02-08 DIAGNOSIS — G4700 Insomnia, unspecified: Secondary | ICD-10-CM

## 2012-02-08 DIAGNOSIS — D696 Thrombocytopenia, unspecified: Secondary | ICD-10-CM

## 2012-02-08 DIAGNOSIS — E118 Type 2 diabetes mellitus with unspecified complications: Secondary | ICD-10-CM

## 2012-02-08 DIAGNOSIS — N189 Chronic kidney disease, unspecified: Secondary | ICD-10-CM

## 2012-02-08 DIAGNOSIS — I739 Peripheral vascular disease, unspecified: Secondary | ICD-10-CM

## 2012-02-08 DIAGNOSIS — K219 Gastro-esophageal reflux disease without esophagitis: Secondary | ICD-10-CM

## 2012-02-08 DIAGNOSIS — F411 Generalized anxiety disorder: Secondary | ICD-10-CM

## 2012-02-08 DIAGNOSIS — N289 Disorder of kidney and ureter, unspecified: Secondary | ICD-10-CM

## 2012-02-08 DIAGNOSIS — I359 Nonrheumatic aortic valve disorder, unspecified: Secondary | ICD-10-CM

## 2012-02-08 LAB — CBC WITH DIFFERENTIAL/PLATELET
BASO%: 0.2 % (ref 0.0–2.0)
HCT: 29.9 % — ABNORMAL LOW (ref 34.8–46.6)
LYMPH%: 22.6 % (ref 14.0–49.7)
MCH: 33.6 pg (ref 25.1–34.0)
MCHC: 33.1 g/dL (ref 31.5–36.0)
MCV: 101.4 fL — ABNORMAL HIGH (ref 79.5–101.0)
MONO#: 0.7 10*3/uL (ref 0.1–0.9)
MONO%: 12.6 % (ref 0.0–14.0)
NEUT%: 61.2 % (ref 38.4–76.8)
Platelets: 72 10*3/uL — ABNORMAL LOW (ref 145–400)
RBC: 2.95 10*6/uL — ABNORMAL LOW (ref 3.70–5.45)
WBC: 5.2 10*3/uL (ref 3.9–10.3)
nRBC: 0 % (ref 0–0)

## 2012-02-08 MED ORDER — DARBEPOETIN ALFA-POLYSORBATE 300 MCG/0.6ML IJ SOLN
300.0000 ug | Freq: Once | INTRAMUSCULAR | Status: AC
  Administered 2012-02-08: 300 ug via SUBCUTANEOUS
  Filled 2012-02-08: qty 0.6

## 2012-02-08 NOTE — Progress Notes (Signed)
Hematology and Oncology Follow Up Visit  [name] [id number] [birth year] 71 y.o. 02/08/2012 10:23 AM  CC: [name]. [name], M.D.    Principle Diagnosis: This is a 71 year old female with multifactorial anemia.  She has anemia of chronic disease, anemia of renal disease, diagnosed in 2011.   Current therapy:  Aranesp 300 mcg subcutaneously every 3 weeks keeping hemoglobin above 11.  Interim History: [name] presents today for a followup visit with her husband.  This is a very pleasant woman with a few comorbid conditions and history of renal insufficiency with a diagnosis of chronic anemia. She has been on Aranesp and have helped her overall quality of life and improvement in her performance status.  She had really reported some increase in her energy, increase in her appetite.  She still has some balance issues.  She has difficulty maintaining balance at times although she did not have any falls and at times requires assistance from her husband for certain activities of daily living.   She has not had any problems associated with Aranesp.  She had not had any injection or infusion problem and denied any toxicities. She has not noticed any bleeding.  Medications: I have reviewed the patient's current medications. Current outpatient prescriptions:ALPRAZolam (XANAX) 0.5 MG tablet, Take 0.5 mg by mouth daily., Disp: , Rfl: ;  amLODipine (NORVASC) 5 MG tablet, Take 1 tablet (5 mg total) by mouth daily., Disp: 1 tablet, Rfl: 0;  aspirin 81 MG tablet, Take 81 mg by mouth daily. , Disp: , Rfl: ;  carvedilol (COREG) 12.5 MG tablet, Take 1 tablet (12.5 mg total) by mouth 2 (two) times daily., Disp: 60 tablet, Rfl: 6 cyanocobalamin 1000 MCG tablet, Take 100 mcg by mouth daily., Disp: , Rfl: ;  metFORMIN (GLUCOPHAGE) 1000 MG tablet, Take 1,000 mg by mouth 2 (two) times daily with a meal., Disp: , Rfl: ;  Multiple Vitamin (MULTIVITAMIN) capsule, Take 1 capsule by mouth daily., Disp: , Rfl: ;  niacin (NIASPAN)  1000 MG CR tablet, Take 1,000 mg by mouth at bedtime. , Disp: , Rfl:  omeprazole (PRILOSEC) 20 MG capsule, Take 20 mg by mouth daily. As needed, Disp: , Rfl: ;  rosuvastatin (CRESTOR) 5 MG tablet, Take 1 tablet (5 mg total) by mouth daily., Disp: 90 tablet, Rfl: 3;  timolol (BETIMOL) 0.5 % ophthalmic solution, 1 drop 2 (two) times daily., Disp: , Rfl: ;  DISCONTD: metoprolol succinate (TOPROL-XL) 50 MG 24 hr tablet, Take 50 mg by mouth 2 (two) times daily. Take with or immediately following a meal., Disp: , Rfl:  Current facility-administered medications:darbepoetin (ARANESP) injection 300 mcg, 300 mcg, Subcutaneous, Once, Jeanne Shape, NP, 300 mcg at 02/08/12 1011  Allergies: No Known Allergies  Past Medical History, Surgical history, Social history, and Family History were reviewed and updated.  Review of Systems: Constitutional:  Negative for fever, chills, night sweats, anorexia, weight loss, pain. Cardiovascular: no chest pain or dyspnea on exertion Respiratory: no cough, shortness of breath, or wheezing Neurological: no TIA or stroke symptoms Dermatological: negative ENT: negative Skin: Negative. Gastrointestinal: no abdominal pain, change in bowel habits, or black or bloody stools Genito-Urinary: no dysuria, trouble voiding, or hematuria Hematological and Lymphatic: negative Breast: negative Musculoskeletal: negative Remaining ROS negative.  Physical Exam: Blood pressure 119/70, pulse 61, temperature 96.7 F (35.9 C), temperature source Oral, height 5\' 8"  (1.727 m), weight 140 lb 4.8 oz (63.64 kg). ECOG: 1 General appearance: alert Head: Normocephalic, without obvious abnormality, atraumatic Neck: no adenopathy,  no carotid bruit, no JVD, supple, symmetrical, trachea midline and thyroid not enlarged, symmetric, no tenderness/mass/nodules Lymph nodes: Cervical, supraclavicular, and axillary nodes normal. Heart:regular rate and rhythm, S1, S2 normal, no murmur, click, rub or  gallop Lung:chest clear, no wheezing, rales, normal symmetric air entry Abdomen: soft, non-tender, without masses or organomegaly EXT:no erythema, induration, or nodules  Lab Results: Lab Results  Component Value Date   WBC 5.2 02/08/2012   HGB 9.9* 02/08/2012   HCT 29.9* 02/08/2012   MCV 101.4* 02/08/2012   PLT 72* 02/08/2012   Impression and Plan:  A 71 year old female with the following issues:  1.     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  We will keep her on Aranesp, keep her hemoglobin above 11. She will receive an Aranesp injection today 2.     Thrombocytopenia.  Her platelet counts are stable. No major bleeding at this time. However, we will continue to monitor her blood counts closely.  The likelihood of possibly developing myelodysplastic syndrome is always a possibility.  3.     Follow-up: CBC and Aranesp injection every 3 weeks. Visit in about 2 months.   [place]ma, [place] 6/26/201310:23 AM

## 2012-02-08 NOTE — Telephone Encounter (Signed)
Gave pt appt for July and August lab, inj and MD

## 2012-02-22 ENCOUNTER — Other Ambulatory Visit (INDEPENDENT_AMBULATORY_CARE_PROVIDER_SITE_OTHER): Payer: Medicare Other

## 2012-02-22 DIAGNOSIS — I25810 Atherosclerosis of coronary artery bypass graft(s) without angina pectoris: Secondary | ICD-10-CM

## 2012-02-22 LAB — LIPID PANEL
LDL Cholesterol: 32 mg/dL (ref 0–99)
Total CHOL/HDL Ratio: 2
Triglycerides: 88 mg/dL (ref 0.0–149.0)

## 2012-02-29 ENCOUNTER — Other Ambulatory Visit (HOSPITAL_BASED_OUTPATIENT_CLINIC_OR_DEPARTMENT_OTHER): Payer: Medicare Other | Admitting: Lab

## 2012-02-29 ENCOUNTER — Ambulatory Visit: Payer: Medicare Other

## 2012-02-29 DIAGNOSIS — N189 Chronic kidney disease, unspecified: Secondary | ICD-10-CM

## 2012-02-29 DIAGNOSIS — D649 Anemia, unspecified: Secondary | ICD-10-CM

## 2012-02-29 DIAGNOSIS — F411 Generalized anxiety disorder: Secondary | ICD-10-CM

## 2012-02-29 DIAGNOSIS — M545 Low back pain, unspecified: Secondary | ICD-10-CM

## 2012-02-29 DIAGNOSIS — I739 Peripheral vascular disease, unspecified: Secondary | ICD-10-CM

## 2012-02-29 DIAGNOSIS — G4700 Insomnia, unspecified: Secondary | ICD-10-CM

## 2012-02-29 DIAGNOSIS — I10 Essential (primary) hypertension: Secondary | ICD-10-CM

## 2012-02-29 DIAGNOSIS — I6529 Occlusion and stenosis of unspecified carotid artery: Secondary | ICD-10-CM

## 2012-02-29 DIAGNOSIS — D696 Thrombocytopenia, unspecified: Secondary | ICD-10-CM

## 2012-02-29 DIAGNOSIS — I359 Nonrheumatic aortic valve disorder, unspecified: Secondary | ICD-10-CM

## 2012-02-29 DIAGNOSIS — E118 Type 2 diabetes mellitus with unspecified complications: Secondary | ICD-10-CM

## 2012-02-29 DIAGNOSIS — K219 Gastro-esophageal reflux disease without esophagitis: Secondary | ICD-10-CM

## 2012-02-29 DIAGNOSIS — I5040 Unspecified combined systolic (congestive) and diastolic (congestive) heart failure: Secondary | ICD-10-CM

## 2012-02-29 DIAGNOSIS — E785 Hyperlipidemia, unspecified: Secondary | ICD-10-CM

## 2012-02-29 DIAGNOSIS — I2510 Atherosclerotic heart disease of native coronary artery without angina pectoris: Secondary | ICD-10-CM

## 2012-02-29 DIAGNOSIS — I5032 Chronic diastolic (congestive) heart failure: Secondary | ICD-10-CM

## 2012-02-29 LAB — CBC WITH DIFFERENTIAL/PLATELET
BASO%: 0.6 % (ref 0.0–2.0)
Basophils Absolute: 0 10*3/uL (ref 0.0–0.1)
EOS%: 4.6 % (ref 0.0–7.0)
HGB: 11 g/dL — ABNORMAL LOW (ref 11.6–15.9)
MCH: 35.1 pg — ABNORMAL HIGH (ref 25.1–34.0)
MONO#: 0.5 10*3/uL (ref 0.1–0.9)
RDW: 14.7 % — ABNORMAL HIGH (ref 11.2–14.5)
WBC: 3.9 10*3/uL (ref 3.9–10.3)
lymph#: 0.7 10*3/uL — ABNORMAL LOW (ref 0.9–3.3)

## 2012-02-29 MED ORDER — DARBEPOETIN ALFA-POLYSORBATE 300 MCG/0.6ML IJ SOLN: 300.0000 ug | Freq: Once | INTRAMUSCULAR | Status: DC

## 2012-03-21 ENCOUNTER — Other Ambulatory Visit (HOSPITAL_BASED_OUTPATIENT_CLINIC_OR_DEPARTMENT_OTHER): Payer: Medicare Other | Admitting: Lab

## 2012-03-21 ENCOUNTER — Ambulatory Visit (HOSPITAL_BASED_OUTPATIENT_CLINIC_OR_DEPARTMENT_OTHER): Payer: Medicare Other

## 2012-03-21 VITALS — BP 151/68 | HR 65 | Temp 96.9°F

## 2012-03-21 DIAGNOSIS — I2510 Atherosclerotic heart disease of native coronary artery without angina pectoris: Secondary | ICD-10-CM

## 2012-03-21 DIAGNOSIS — M545 Low back pain, unspecified: Secondary | ICD-10-CM

## 2012-03-21 DIAGNOSIS — E118 Type 2 diabetes mellitus with unspecified complications: Secondary | ICD-10-CM

## 2012-03-21 DIAGNOSIS — D649 Anemia, unspecified: Secondary | ICD-10-CM

## 2012-03-21 DIAGNOSIS — I739 Peripheral vascular disease, unspecified: Secondary | ICD-10-CM

## 2012-03-21 DIAGNOSIS — G4700 Insomnia, unspecified: Secondary | ICD-10-CM

## 2012-03-21 DIAGNOSIS — I359 Nonrheumatic aortic valve disorder, unspecified: Secondary | ICD-10-CM

## 2012-03-21 DIAGNOSIS — I5032 Chronic diastolic (congestive) heart failure: Secondary | ICD-10-CM

## 2012-03-21 DIAGNOSIS — I5040 Unspecified combined systolic (congestive) and diastolic (congestive) heart failure: Secondary | ICD-10-CM

## 2012-03-21 DIAGNOSIS — E785 Hyperlipidemia, unspecified: Secondary | ICD-10-CM

## 2012-03-21 DIAGNOSIS — D696 Thrombocytopenia, unspecified: Secondary | ICD-10-CM

## 2012-03-21 DIAGNOSIS — K219 Gastro-esophageal reflux disease without esophagitis: Secondary | ICD-10-CM

## 2012-03-21 DIAGNOSIS — I6529 Occlusion and stenosis of unspecified carotid artery: Secondary | ICD-10-CM

## 2012-03-21 DIAGNOSIS — F411 Generalized anxiety disorder: Secondary | ICD-10-CM

## 2012-03-21 DIAGNOSIS — I10 Essential (primary) hypertension: Secondary | ICD-10-CM

## 2012-03-21 DIAGNOSIS — N189 Chronic kidney disease, unspecified: Secondary | ICD-10-CM

## 2012-03-21 LAB — CBC WITH DIFFERENTIAL/PLATELET
Basophils Absolute: 0 10*3/uL (ref 0.0–0.1)
Eosinophils Absolute: 0.1 10*3/uL (ref 0.0–0.5)
HGB: 10.2 g/dL — ABNORMAL LOW (ref 11.6–15.9)
NEUT#: 3.8 10*3/uL (ref 1.5–6.5)
RDW: 13.2 % (ref 11.2–14.5)
lymph#: 1.4 10*3/uL (ref 0.9–3.3)

## 2012-03-21 MED ORDER — DARBEPOETIN ALFA-POLYSORBATE 300 MCG/0.6ML IJ SOLN
300.0000 ug | Freq: Once | INTRAMUSCULAR | Status: AC
  Administered 2012-03-21: 300 ug via SUBCUTANEOUS
  Filled 2012-03-21: qty 0.6

## 2012-03-30 ENCOUNTER — Telehealth: Payer: Self-pay | Admitting: Oncology

## 2012-03-30 NOTE — Telephone Encounter (Signed)
Moved 8/23 appt to [facility]. S/w pt re new time for 8/23 @ 11:15 am.

## 2012-04-11 ENCOUNTER — Other Ambulatory Visit (HOSPITAL_BASED_OUTPATIENT_CLINIC_OR_DEPARTMENT_OTHER): Payer: Medicare Other | Admitting: Lab

## 2012-04-11 ENCOUNTER — Ambulatory Visit (HOSPITAL_BASED_OUTPATIENT_CLINIC_OR_DEPARTMENT_OTHER): Payer: Medicare Other | Admitting: Oncology

## 2012-04-11 ENCOUNTER — Ambulatory Visit: Payer: Medicare Other

## 2012-04-11 ENCOUNTER — Telehealth: Payer: Self-pay | Admitting: Oncology

## 2012-04-11 ENCOUNTER — Encounter: Payer: Self-pay | Admitting: Oncology

## 2012-04-11 ENCOUNTER — Ambulatory Visit: Payer: Medicare Other | Admitting: Oncology

## 2012-04-11 ENCOUNTER — Other Ambulatory Visit: Payer: Medicare Other | Admitting: Lab

## 2012-04-11 VITALS — BP 129/63 | HR 64 | Temp 96.9°F | Resp 18 | Ht 68.0 in | Wt 139.4 lb

## 2012-04-11 DIAGNOSIS — D696 Thrombocytopenia, unspecified: Secondary | ICD-10-CM

## 2012-04-11 DIAGNOSIS — D649 Anemia, unspecified: Secondary | ICD-10-CM

## 2012-04-11 LAB — CBC WITH DIFFERENTIAL/PLATELET
Basophils Absolute: 0 10*3/uL (ref 0.0–0.1)
HCT: 33.5 % — ABNORMAL LOW (ref 34.8–46.6)
HGB: 11.1 g/dL — ABNORMAL LOW (ref 11.6–15.9)
MCH: 34.4 pg — ABNORMAL HIGH (ref 25.1–34.0)
MONO#: 0.6 10*3/uL (ref 0.1–0.9)
NEUT%: 64.9 % (ref 38.4–76.8)
Platelets: 67 10*3/uL — ABNORMAL LOW (ref 145–400)
WBC: 5.2 10*3/uL (ref 3.9–10.3)
lymph#: 1 10*3/uL (ref 0.9–3.3)

## 2012-04-11 NOTE — Progress Notes (Signed)
Hematology and Oncology Follow Up Visit  [name] [id number] Feb 12, 1941 71 y.o. 04/11/2012 1:18 PM  CC: [name]. Redmond Pulling, M.D.    Principle Diagnosis: This is a 71 year old female with multifactorial anemia.  She has anemia of chronic disease, anemia of renal disease, diagnosed in 2011.   Current therapy:  Aranesp 300 mcg subcutaneously every 3 weeks keeping hemoglobin above 11.  Interim History: [name] presents today for a followup visit with her husband.  This is a very pleasant woman with a few comorbid conditions and history of renal insufficiency with a diagnosis of chronic anemia. She has been on Aranesp and have helped her overall quality of life and improvement in her performance status.  She had really reported some increase in her energy, increase in her appetite.  She still has some balance issues.  She has difficulty maintaining balance at times. Denies any recent falls. Requires assistance from her husband for certain activities of daily living.   She has not had any problems associated with Aranesp.  She had not had any injection or infusion problem and denied any toxicities. She has not noticed any bleeding.  Medications: I have reviewed the patient's current medications. Current outpatient prescriptions:ALPRAZolam (XANAX) 0.5 MG tablet, Take 0.5 mg by mouth daily., Disp: , Rfl: ;  amLODipine (NORVASC) 5 MG tablet, Take 1 tablet (5 mg total) by mouth daily., Disp: 1 tablet, Rfl: 0;  aspirin 81 MG tablet, Take 81 mg by mouth daily. , Disp: , Rfl: ;  carvedilol (COREG) 12.5 MG tablet, Take 1 tablet (12.5 mg total) by mouth 2 (two) times daily., Disp: 60 tablet, Rfl: 6 cyanocobalamin 1000 MCG tablet, Take 100 mcg by mouth daily., Disp: , Rfl: ;  metFORMIN (GLUCOPHAGE) 1000 MG tablet, Take 1,000 mg by mouth 2 (two) times daily with a meal., Disp: , Rfl: ;  Multiple Vitamin (MULTIVITAMIN) capsule, Take 1 capsule by mouth daily., Disp: , Rfl: ;  niacin (NIASPAN) 1000 MG CR tablet, Take  1,000 mg by mouth at bedtime. , Disp: , Rfl:  omeprazole (PRILOSEC) 20 MG capsule, Take 20 mg by mouth daily. As needed, Disp: , Rfl: ;  rosuvastatin (CRESTOR) 5 MG tablet, Take 1 tablet (5 mg total) by mouth daily., Disp: 90 tablet, Rfl: 3;  timolol (BETIMOL) 0.5 % ophthalmic solution, 1 drop 2 (two) times daily., Disp: , Rfl: ;  DISCONTD: metoprolol succinate (TOPROL-XL) 50 MG 24 hr tablet, Take 50 mg by mouth 2 (two) times daily. Take with or immediately following a meal., Disp: , Rfl:   Allergies: No Known Allergies  Past Medical History, Surgical history, Social history, and Family History were reviewed and updated.  Review of Systems: Constitutional:  Negative for fever, chills, night sweats, anorexia, weight loss, pain. Cardiovascular: no chest pain or dyspnea on exertion Respiratory: no cough, shortness of breath, or wheezing Neurological: no TIA or stroke symptoms Dermatological: negative ENT: negative Skin: Negative. Gastrointestinal: no abdominal pain, change in bowel habits, or black or bloody stools Genito-Urinary: no dysuria, trouble voiding, or hematuria Hematological and Lymphatic: negative Breast: negative Musculoskeletal: negative Remaining ROS negative.  Physical Exam: Blood pressure 129/63, pulse 64, temperature 96.9 F (36.1 C), temperature source Oral, resp. rate 18, height 5\' 8"  (1.727 m), weight 139 lb 6.4 oz (63.231 kg). ECOG: 1 General appearance: alert Head: Normocephalic, without obvious abnormality, atraumatic Neck: no adenopathy, no carotid bruit, no JVD, supple, symmetrical, trachea midline and thyroid not enlarged, symmetric, no tenderness/mass/nodules Lymph nodes: Cervical, supraclavicular, and axillary nodes  normal. Heart:regular rate and rhythm, S1, S2 normal, no murmur, click, rub or gallop Lung:chest clear, no wheezing, rales, normal symmetric air entry Abdomen: soft, non-tender, without masses or organomegaly EXT:no erythema, induration, or  nodules  Lab Results: Lab Results  Component Value Date   WBC 5.2 04/11/2012   HGB 11.1* 04/11/2012   HCT 33.5* 04/11/2012   MCV 103.7* 04/11/2012   PLT 67* 04/11/2012   Impression and Plan:  A 71 year old female with the following issues:  1.     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  We will keep her on Aranesp, keep her hemoglobin above 11. Hemoglobin is 11.1. Aranesp was held today. 2.     Thrombocytopenia.  Her platelet count is stable. No major bleeding at this time. However, we will continue to monitor her blood counts closely.  The likelihood of possibly developing myelodysplastic syndrome is always a possibility.  3.     Follow-up: CBC and Aranesp injection every 3 weeks. Visit in about 4 months.   Mikey Bussing 8/28/20131:18 PM

## 2012-04-11 NOTE — Patient Instructions (Addendum)
Your Hemoglobin is 11.1 today. No Aranesp injection needed.  Continue to come to ur office every 3 weeks to check you labs. You will receive an Aranesp injection if your Hemoglobin is <11.0.  You platelets remain low, but are stable. We will continue to watch this.

## 2012-04-11 NOTE — Progress Notes (Signed)
hbg 11.1, aranesp not indicated.  dmr

## 2012-04-11 NOTE — Telephone Encounter (Signed)
Gave pt appt calendar for September, October , November and Decmebr 2013 lab, injections and MD

## 2012-04-25 ENCOUNTER — Ambulatory Visit: Payer: Medicare Other | Admitting: Cardiology

## 2012-04-30 ENCOUNTER — Encounter: Payer: Self-pay | Admitting: Cardiology

## 2012-04-30 ENCOUNTER — Ambulatory Visit (INDEPENDENT_AMBULATORY_CARE_PROVIDER_SITE_OTHER): Payer: Medicare Other | Admitting: Cardiology

## 2012-04-30 VITALS — BP 126/60 | HR 60 | Ht 68.0 in | Wt 140.0 lb

## 2012-04-30 DIAGNOSIS — I25810 Atherosclerosis of coronary artery bypass graft(s) without angina pectoris: Secondary | ICD-10-CM

## 2012-04-30 DIAGNOSIS — I10 Essential (primary) hypertension: Secondary | ICD-10-CM

## 2012-04-30 DIAGNOSIS — I739 Peripheral vascular disease, unspecified: Secondary | ICD-10-CM

## 2012-04-30 DIAGNOSIS — I359 Nonrheumatic aortic valve disorder, unspecified: Secondary | ICD-10-CM

## 2012-04-30 DIAGNOSIS — E785 Hyperlipidemia, unspecified: Secondary | ICD-10-CM

## 2012-04-30 DIAGNOSIS — I6529 Occlusion and stenosis of unspecified carotid artery: Secondary | ICD-10-CM

## 2012-04-30 DIAGNOSIS — I2510 Atherosclerotic heart disease of native coronary artery without angina pectoris: Secondary | ICD-10-CM

## 2012-04-30 NOTE — Patient Instructions (Addendum)
You should use a cane to help with balance and stability when you walk.   Your physician has requested that you have a lower extremity arterial duplex. This test is an ultrasound of the arteries in the legs . It looks at arterial blood flow in the legs. Allow one hour for Lower  Arterial scans. There are no restrictions or special instructions.  Your physician has requested that you have a carotid duplex. This test is an ultrasound of the carotid arteries in your neck. It looks at blood flow through these arteries that supply the brain with blood. Allow one hour for this exam. There are no restrictions or special instructions. March 2014.   Your physician wants you to follow-up in: 6 months with Dr Aundra Dubin. (March 2014). You will receive a reminder letter in the mail two months in advance. If you don't receive a letter, please call our office to schedule the follow-up appointment.

## 2012-04-30 NOTE — Progress Notes (Signed)
Patient ID: [name], female   DOB: 07-Sep-1940, 71 y.o.   MRN: [id number] PCP: Dr. Kathryne Eriksson  71 yo with h/o CAD s/p CABG and diastolic CHF presents for followup.  She is doing fairly well.  She can walk on flat ground without dyspnea.  She does have some shortness of breath walking up steps.  No chest pain.  She has balance difficulty but has had no recent falls.  Her legs continue to "tire" easily and she has some thigh pain on occasion.  It is hard to delineate whether this pain is actually exertional.  She does have a history of PAD primarily manifested as abnormal TBIs.  She continues to follow with hematology for anemia and thrombocytopenia.  She gets Aranesp injections.   ECG: NSR, old ASMI  Labs (4/10): creatinine 1.9  Labs (7/10): LDL 49, HDL 51, LFTs normal  Labs (1/11): K 5.0, creatinine 1.4  Labs (2/11): LDL 51, HDL 62, HCT 27  Labs (5/12): K 4.4, creatinine 1.77 Labs (2/13): HCT 36.5 Labs (5/13): K 4.4, creatinine 1.9 Labs (7/13): HDL 67, LDL 32 Labs (8/13): HCT 33.5, plts 67  Allergies (verified):  No Known Drug Allergies   Past Medical History:  1. CAD: Pt presented 2/10 to [facility] with NSTEMI and diastolic CHF exacerbation. LHC was done 3/10 showing 99% pRCA stenosis and 80% calcified pLAD stenosis with L=>R collaterals. Pt was referred for CABG which was done by Dr. Prescott Gum with LIMA-LAD, SVG-RCA, SVG-OM.  2. Diastolic CHF: Echo ([id number]) showed EF 55-65%, mild LVH, diastolic dysfunction, mild AS with mean gradient 12 mmHg, PASP 43 mmHg. Echo (2/12): EF 55-60%, mild LVH, mild AS (mean gradient 12), PA systolic pressure 32 mmHg.  3. CKD 4. DM2  5. HTN  6. Hyperlipidemia: nausea/GI discomfort with atorvastatin, ? Balance problems with pravastatin.  7. Anxiety  8. Chronic low back pain  9. History of thrombocytopenia  10. 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  Followed by hematology, getting Aranesp injections 11. Mild aortic stenosis: mean gradient  12 mmHg in 2/12.  12. Carotid stenosis: 40-59% bilateral ICA stenosis in 99991111. 123456 LICA stenosis in [id number].   13. PAD: ABIs 3/12 were normal but TBIs were mildly decreased.   Family History:  Pt was adopted, does not know biological family's history.   Social History:  Married, lives in [place]. Retired from [employer]. Quit smoking around 1990. Occasional ETOH. Has 1 son.   Review of Systems  All systems reviewed and negative except as per HPI.   Current Outpatient Prescriptions  Medication Sig Dispense Refill  . ALPRAZolam (XANAX) 0.5 MG tablet Take 0.5 mg by mouth daily.      [name] amLODipine (NORVASC) 5 MG tablet Take 1 tablet (5 mg total) by mouth daily.  1 tablet  0  . aspirin 81 MG tablet Take 81 mg by mouth daily.       . carvedilol (COREG) 12.5 MG tablet Take 1 tablet (12.5 mg total) by mouth 2 (two) times daily.  60 tablet  6  . cyanocobalamin 1000 MCG tablet Take 100 mcg by mouth daily.      . metFORMIN (GLUCOPHAGE) 1000 MG tablet Take 1,000 mg by mouth 2 (two) times daily with a meal.      . Multiple Vitamin (MULTIVITAMIN) capsule Take 1 capsule by mouth daily.      . niacin (NIASPAN) 1000 MG CR tablet Take 1,000 mg by mouth at bedtime.       [name]  omeprazole (PRILOSEC) 20 MG capsule Take 20 mg by mouth daily. As needed      . rosuvastatin (CRESTOR) 5 MG tablet Take 1 tablet (5 mg total) by mouth daily.  90 tablet  3  . timolol (BETIMOL) 0.5 % ophthalmic solution 1 drop 2 (two) times daily.      [name] DISCONTD: metoprolol succinate (TOPROL-XL) 50 MG 24 hr tablet Take 50 mg by mouth 2 (two) times daily. Take with or immediately following a meal.        BP 126/60  Pulse 60  Ht 5\' 8"  (1.727 m)  Wt 140 lb (63.504 kg)  BMI 21.29 kg/m2 General: NAD Neck: No JVD, no thyromegaly or thyroid nodule.  Lungs: Clear to auscultation bilaterally with normal respiratory effort. CV: Nondisplaced PMI.  Heart regular S1/S2, no S3/S4, 2/6 SEM RUSB.  No edema.  Left carotid bruit.  Unable to  palpate pedal pulses but feet warm.  Abdomen: Soft, nontender, no hepatosplenomegaly, no distention.  Neurologic: Alert and oriented x 3.  Psych: Normal affect. Extremities: No clubbing or cyanosis.   Assessment/Plan 1. CAD Status post CABG. No ischemic symptoms. Continue ASA 81, Coreg, and statin. 2. AORTIC STENOSIS  Mild aortic stenosis. Stable murmur.  3. CAROTID ARTERY STENOSIS  Repeat carotid dopplers in 3/14.  4. CLAUDICATION Legs tire easily and she has some poorly characterized pain in the thighs bilaterally. ABIs were normal when checked in 3/12. TBIs were only mildly abnormal and not at risk for tissue loss.  I will repeat ABIs.  5. HYPERTENSION, UNSPECIFIED  BP is well-controlled.  6. Hyperlipidemia Goal LDL < 70 with known CAD and PAD.  LDL was 32.   [place] [employer]

## 2012-05-02 ENCOUNTER — Other Ambulatory Visit (HOSPITAL_BASED_OUTPATIENT_CLINIC_OR_DEPARTMENT_OTHER): Payer: Medicare Other | Admitting: Lab

## 2012-05-02 ENCOUNTER — Ambulatory Visit (HOSPITAL_BASED_OUTPATIENT_CLINIC_OR_DEPARTMENT_OTHER): Payer: Medicare Other

## 2012-05-02 VITALS — BP 120/63 | HR 64 | Temp 97.2°F

## 2012-05-02 DIAGNOSIS — K219 Gastro-esophageal reflux disease without esophagitis: Secondary | ICD-10-CM

## 2012-05-02 DIAGNOSIS — D649 Anemia, unspecified: Secondary | ICD-10-CM

## 2012-05-02 DIAGNOSIS — I359 Nonrheumatic aortic valve disorder, unspecified: Secondary | ICD-10-CM

## 2012-05-02 DIAGNOSIS — M545 Low back pain, unspecified: Secondary | ICD-10-CM

## 2012-05-02 DIAGNOSIS — I6529 Occlusion and stenosis of unspecified carotid artery: Secondary | ICD-10-CM

## 2012-05-02 DIAGNOSIS — I10 Essential (primary) hypertension: Secondary | ICD-10-CM

## 2012-05-02 DIAGNOSIS — I2510 Atherosclerotic heart disease of native coronary artery without angina pectoris: Secondary | ICD-10-CM

## 2012-05-02 DIAGNOSIS — E118 Type 2 diabetes mellitus with unspecified complications: Secondary | ICD-10-CM

## 2012-05-02 DIAGNOSIS — I5040 Unspecified combined systolic (congestive) and diastolic (congestive) heart failure: Secondary | ICD-10-CM

## 2012-05-02 DIAGNOSIS — G4700 Insomnia, unspecified: Secondary | ICD-10-CM

## 2012-05-02 DIAGNOSIS — I739 Peripheral vascular disease, unspecified: Secondary | ICD-10-CM

## 2012-05-02 DIAGNOSIS — I5032 Chronic diastolic (congestive) heart failure: Secondary | ICD-10-CM

## 2012-05-02 DIAGNOSIS — N189 Chronic kidney disease, unspecified: Secondary | ICD-10-CM

## 2012-05-02 DIAGNOSIS — D696 Thrombocytopenia, unspecified: Secondary | ICD-10-CM

## 2012-05-02 DIAGNOSIS — F411 Generalized anxiety disorder: Secondary | ICD-10-CM

## 2012-05-02 DIAGNOSIS — E785 Hyperlipidemia, unspecified: Secondary | ICD-10-CM

## 2012-05-02 LAB — CBC WITH DIFFERENTIAL/PLATELET
Basophils Absolute: 0 10*3/uL (ref 0.0–0.1)
Eosinophils Absolute: 0.7 10*3/uL — ABNORMAL HIGH (ref 0.0–0.5)
HCT: 29.6 % — ABNORMAL LOW (ref 34.8–46.6)
HGB: 9.6 g/dL — ABNORMAL LOW (ref 11.6–15.9)
LYMPH%: 22.2 % (ref 14.0–49.7)
MCV: 104.6 fL — ABNORMAL HIGH (ref 79.5–101.0)
MONO%: 11 % (ref 0.0–14.0)
NEUT#: 3.4 10*3/uL (ref 1.5–6.5)
NEUT%: 55.4 % (ref 38.4–76.8)
Platelets: 75 10*3/uL — ABNORMAL LOW (ref 145–400)

## 2012-05-02 MED ORDER — DARBEPOETIN ALFA-POLYSORBATE 300 MCG/0.6ML IJ SOLN
300.0000 ug | Freq: Once | INTRAMUSCULAR | Status: AC
  Administered 2012-05-02: 300 ug via SUBCUTANEOUS
  Filled 2012-05-02: qty 0.6

## 2012-05-15 ENCOUNTER — Encounter (INDEPENDENT_AMBULATORY_CARE_PROVIDER_SITE_OTHER): Payer: Medicare Other

## 2012-05-15 DIAGNOSIS — I70219 Atherosclerosis of native arteries of extremities with intermittent claudication, unspecified extremity: Secondary | ICD-10-CM

## 2012-05-15 DIAGNOSIS — I739 Peripheral vascular disease, unspecified: Secondary | ICD-10-CM

## 2012-05-18 ENCOUNTER — Other Ambulatory Visit: Payer: Self-pay | Admitting: Cardiology

## 2012-05-21 ENCOUNTER — Other Ambulatory Visit: Payer: Self-pay | Admitting: *Deleted

## 2012-05-21 MED ORDER — CARVEDILOL 12.5 MG PO TABS: 12.5000 mg | ORAL_TABLET | Freq: Two times a day (BID) | ORAL | Status: DC

## 2012-05-21 NOTE — Telephone Encounter (Signed)
Refilled Carvedilol. 

## 2012-05-23 ENCOUNTER — Other Ambulatory Visit (HOSPITAL_BASED_OUTPATIENT_CLINIC_OR_DEPARTMENT_OTHER): Payer: Medicare Other | Admitting: Lab

## 2012-05-23 ENCOUNTER — Ambulatory Visit (HOSPITAL_BASED_OUTPATIENT_CLINIC_OR_DEPARTMENT_OTHER): Payer: Medicare Other

## 2012-05-23 VITALS — BP 137/69 | HR 63 | Temp 98.0°F

## 2012-05-23 DIAGNOSIS — N189 Chronic kidney disease, unspecified: Secondary | ICD-10-CM

## 2012-05-23 DIAGNOSIS — I739 Peripheral vascular disease, unspecified: Secondary | ICD-10-CM

## 2012-05-23 DIAGNOSIS — D649 Anemia, unspecified: Secondary | ICD-10-CM

## 2012-05-23 DIAGNOSIS — G4700 Insomnia, unspecified: Secondary | ICD-10-CM

## 2012-05-23 DIAGNOSIS — I2510 Atherosclerotic heart disease of native coronary artery without angina pectoris: Secondary | ICD-10-CM

## 2012-05-23 DIAGNOSIS — I359 Nonrheumatic aortic valve disorder, unspecified: Secondary | ICD-10-CM

## 2012-05-23 DIAGNOSIS — D696 Thrombocytopenia, unspecified: Secondary | ICD-10-CM

## 2012-05-23 DIAGNOSIS — E785 Hyperlipidemia, unspecified: Secondary | ICD-10-CM

## 2012-05-23 DIAGNOSIS — I10 Essential (primary) hypertension: Secondary | ICD-10-CM

## 2012-05-23 DIAGNOSIS — I6529 Occlusion and stenosis of unspecified carotid artery: Secondary | ICD-10-CM

## 2012-05-23 DIAGNOSIS — E118 Type 2 diabetes mellitus with unspecified complications: Secondary | ICD-10-CM

## 2012-05-23 DIAGNOSIS — I5040 Unspecified combined systolic (congestive) and diastolic (congestive) heart failure: Secondary | ICD-10-CM

## 2012-05-23 DIAGNOSIS — K219 Gastro-esophageal reflux disease without esophagitis: Secondary | ICD-10-CM

## 2012-05-23 DIAGNOSIS — M545 Low back pain, unspecified: Secondary | ICD-10-CM

## 2012-05-23 DIAGNOSIS — I5032 Chronic diastolic (congestive) heart failure: Secondary | ICD-10-CM

## 2012-05-23 DIAGNOSIS — F411 Generalized anxiety disorder: Secondary | ICD-10-CM

## 2012-05-23 LAB — CBC WITH DIFFERENTIAL/PLATELET
Basophils Absolute: 0 10*3/uL (ref 0.0–0.1)
Eosinophils Absolute: 0.4 10*3/uL (ref 0.0–0.5)
HGB: 10.9 g/dL — ABNORMAL LOW (ref 11.6–15.9)
MONO#: 0.9 10*3/uL (ref 0.1–0.9)
NEUT#: 2.8 10*3/uL (ref 1.5–6.5)
RDW: 13.9 % (ref 11.2–14.5)
WBC: 4.9 10*3/uL (ref 3.9–10.3)
lymph#: 0.8 10*3/uL — ABNORMAL LOW (ref 0.9–3.3)
nRBC: 0 % (ref 0–0)

## 2012-05-23 MED ORDER — DARBEPOETIN ALFA-POLYSORBATE 300 MCG/0.6ML IJ SOLN
300.0000 ug | Freq: Once | INTRAMUSCULAR | Status: AC
  Administered 2012-05-23: 300 ug via SUBCUTANEOUS
  Filled 2012-05-23: qty 0.6

## 2012-06-13 ENCOUNTER — Other Ambulatory Visit (HOSPITAL_BASED_OUTPATIENT_CLINIC_OR_DEPARTMENT_OTHER): Payer: Medicare Other | Admitting: Lab

## 2012-06-13 ENCOUNTER — Ambulatory Visit: Payer: Medicare Other

## 2012-06-13 DIAGNOSIS — G4700 Insomnia, unspecified: Secondary | ICD-10-CM

## 2012-06-13 DIAGNOSIS — F411 Generalized anxiety disorder: Secondary | ICD-10-CM

## 2012-06-13 DIAGNOSIS — I2510 Atherosclerotic heart disease of native coronary artery without angina pectoris: Secondary | ICD-10-CM

## 2012-06-13 DIAGNOSIS — I739 Peripheral vascular disease, unspecified: Secondary | ICD-10-CM

## 2012-06-13 DIAGNOSIS — I359 Nonrheumatic aortic valve disorder, unspecified: Secondary | ICD-10-CM

## 2012-06-13 DIAGNOSIS — M545 Low back pain, unspecified: Secondary | ICD-10-CM

## 2012-06-13 DIAGNOSIS — E785 Hyperlipidemia, unspecified: Secondary | ICD-10-CM

## 2012-06-13 DIAGNOSIS — D696 Thrombocytopenia, unspecified: Secondary | ICD-10-CM

## 2012-06-13 DIAGNOSIS — I6529 Occlusion and stenosis of unspecified carotid artery: Secondary | ICD-10-CM

## 2012-06-13 DIAGNOSIS — N189 Chronic kidney disease, unspecified: Secondary | ICD-10-CM

## 2012-06-13 DIAGNOSIS — K219 Gastro-esophageal reflux disease without esophagitis: Secondary | ICD-10-CM

## 2012-06-13 DIAGNOSIS — I10 Essential (primary) hypertension: Secondary | ICD-10-CM

## 2012-06-13 DIAGNOSIS — I5032 Chronic diastolic (congestive) heart failure: Secondary | ICD-10-CM

## 2012-06-13 DIAGNOSIS — I5040 Unspecified combined systolic (congestive) and diastolic (congestive) heart failure: Secondary | ICD-10-CM

## 2012-06-13 DIAGNOSIS — D649 Anemia, unspecified: Secondary | ICD-10-CM

## 2012-06-13 DIAGNOSIS — E118 Type 2 diabetes mellitus with unspecified complications: Secondary | ICD-10-CM

## 2012-06-13 LAB — CBC WITH DIFFERENTIAL/PLATELET
Eosinophils Absolute: 0.3 10*3/uL (ref 0.0–0.5)
HCT: 36 % (ref 34.8–46.6)
LYMPH%: 20.9 % (ref 14.0–49.7)
MONO#: 0.6 10*3/uL (ref 0.1–0.9)
NEUT#: 2.5 10*3/uL (ref 1.5–6.5)
NEUT%: 57.7 % (ref 38.4–76.8)
Platelets: 69 10*3/uL — ABNORMAL LOW (ref 145–400)
WBC: 4.3 10*3/uL (ref 3.9–10.3)
lymph#: 0.9 10*3/uL (ref 0.9–3.3)
nRBC: 1 % — ABNORMAL HIGH (ref 0–0)

## 2012-06-13 MED ORDER — DARBEPOETIN ALFA-POLYSORBATE 300 MCG/0.6ML IJ SOLN: 300.0000 ug | Freq: Once | INTRAMUSCULAR | Status: DC

## 2012-07-04 ENCOUNTER — Other Ambulatory Visit (HOSPITAL_BASED_OUTPATIENT_CLINIC_OR_DEPARTMENT_OTHER): Payer: Medicare Other | Admitting: Lab

## 2012-07-04 ENCOUNTER — Ambulatory Visit (HOSPITAL_BASED_OUTPATIENT_CLINIC_OR_DEPARTMENT_OTHER): Payer: Medicare Other

## 2012-07-04 VITALS — BP 145/72 | HR 72 | Temp 96.9°F

## 2012-07-04 DIAGNOSIS — E118 Type 2 diabetes mellitus with unspecified complications: Secondary | ICD-10-CM

## 2012-07-04 DIAGNOSIS — G4700 Insomnia, unspecified: Secondary | ICD-10-CM

## 2012-07-04 DIAGNOSIS — M545 Low back pain, unspecified: Secondary | ICD-10-CM

## 2012-07-04 DIAGNOSIS — I5040 Unspecified combined systolic (congestive) and diastolic (congestive) heart failure: Secondary | ICD-10-CM

## 2012-07-04 DIAGNOSIS — E785 Hyperlipidemia, unspecified: Secondary | ICD-10-CM

## 2012-07-04 DIAGNOSIS — I6529 Occlusion and stenosis of unspecified carotid artery: Secondary | ICD-10-CM

## 2012-07-04 DIAGNOSIS — I10 Essential (primary) hypertension: Secondary | ICD-10-CM

## 2012-07-04 DIAGNOSIS — N189 Chronic kidney disease, unspecified: Secondary | ICD-10-CM

## 2012-07-04 DIAGNOSIS — D649 Anemia, unspecified: Secondary | ICD-10-CM

## 2012-07-04 DIAGNOSIS — I5032 Chronic diastolic (congestive) heart failure: Secondary | ICD-10-CM

## 2012-07-04 DIAGNOSIS — K219 Gastro-esophageal reflux disease without esophagitis: Secondary | ICD-10-CM

## 2012-07-04 DIAGNOSIS — I739 Peripheral vascular disease, unspecified: Secondary | ICD-10-CM

## 2012-07-04 DIAGNOSIS — F411 Generalized anxiety disorder: Secondary | ICD-10-CM

## 2012-07-04 DIAGNOSIS — I359 Nonrheumatic aortic valve disorder, unspecified: Secondary | ICD-10-CM

## 2012-07-04 DIAGNOSIS — I2510 Atherosclerotic heart disease of native coronary artery without angina pectoris: Secondary | ICD-10-CM

## 2012-07-04 DIAGNOSIS — D696 Thrombocytopenia, unspecified: Secondary | ICD-10-CM

## 2012-07-04 LAB — CBC WITH DIFFERENTIAL/PLATELET
Basophils Absolute: 0 10*3/uL (ref 0.0–0.1)
EOS%: 7.3 % — ABNORMAL HIGH (ref 0.0–7.0)
Eosinophils Absolute: 0.4 10*3/uL (ref 0.0–0.5)
HCT: 32.2 % — ABNORMAL LOW (ref 34.8–46.6)
HGB: 10.6 g/dL — ABNORMAL LOW (ref 11.6–15.9)
MCH: 33 pg (ref 25.1–34.0)
MCV: 100.3 fL (ref 79.5–101.0)
NEUT#: 3.2 10*3/uL (ref 1.5–6.5)
NEUT%: 60.5 % (ref 38.4–76.8)
RDW: 12.7 % (ref 11.2–14.5)
lymph#: 1 10*3/uL (ref 0.9–3.3)

## 2012-07-04 MED ORDER — DARBEPOETIN ALFA-POLYSORBATE 300 MCG/0.6ML IJ SOLN
300.0000 ug | Freq: Once | INTRAMUSCULAR | Status: AC
  Administered 2012-07-04: 300 ug via SUBCUTANEOUS
  Filled 2012-07-04: qty 0.6

## 2012-07-11 ENCOUNTER — Encounter: Payer: Self-pay | Admitting: Cardiology

## 2012-07-25 ENCOUNTER — Telehealth: Payer: Self-pay | Admitting: Oncology

## 2012-07-25 ENCOUNTER — Ambulatory Visit (HOSPITAL_BASED_OUTPATIENT_CLINIC_OR_DEPARTMENT_OTHER): Payer: Medicare Other

## 2012-07-25 ENCOUNTER — Ambulatory Visit (HOSPITAL_BASED_OUTPATIENT_CLINIC_OR_DEPARTMENT_OTHER): Payer: Medicare Other | Admitting: Oncology

## 2012-07-25 ENCOUNTER — Other Ambulatory Visit (HOSPITAL_BASED_OUTPATIENT_CLINIC_OR_DEPARTMENT_OTHER): Payer: Medicare Other | Admitting: Lab

## 2012-07-25 VITALS — BP 140/73 | HR 73 | Temp 97.5°F | Resp 18 | Ht 68.0 in | Wt 138.3 lb

## 2012-07-25 DIAGNOSIS — G4700 Insomnia, unspecified: Secondary | ICD-10-CM

## 2012-07-25 DIAGNOSIS — I5040 Unspecified combined systolic (congestive) and diastolic (congestive) heart failure: Secondary | ICD-10-CM

## 2012-07-25 DIAGNOSIS — N189 Chronic kidney disease, unspecified: Secondary | ICD-10-CM

## 2012-07-25 DIAGNOSIS — E118 Type 2 diabetes mellitus with unspecified complications: Secondary | ICD-10-CM

## 2012-07-25 DIAGNOSIS — I10 Essential (primary) hypertension: Secondary | ICD-10-CM

## 2012-07-25 DIAGNOSIS — I6529 Occlusion and stenosis of unspecified carotid artery: Secondary | ICD-10-CM

## 2012-07-25 DIAGNOSIS — D696 Thrombocytopenia, unspecified: Secondary | ICD-10-CM

## 2012-07-25 DIAGNOSIS — I2510 Atherosclerotic heart disease of native coronary artery without angina pectoris: Secondary | ICD-10-CM

## 2012-07-25 DIAGNOSIS — D649 Anemia, unspecified: Secondary | ICD-10-CM

## 2012-07-25 DIAGNOSIS — M545 Low back pain, unspecified: Secondary | ICD-10-CM

## 2012-07-25 DIAGNOSIS — F411 Generalized anxiety disorder: Secondary | ICD-10-CM

## 2012-07-25 DIAGNOSIS — I359 Nonrheumatic aortic valve disorder, unspecified: Secondary | ICD-10-CM

## 2012-07-25 DIAGNOSIS — K219 Gastro-esophageal reflux disease without esophagitis: Secondary | ICD-10-CM

## 2012-07-25 DIAGNOSIS — D638 Anemia in other chronic diseases classified elsewhere: Secondary | ICD-10-CM

## 2012-07-25 DIAGNOSIS — E785 Hyperlipidemia, unspecified: Secondary | ICD-10-CM

## 2012-07-25 DIAGNOSIS — I5032 Chronic diastolic (congestive) heart failure: Secondary | ICD-10-CM

## 2012-07-25 DIAGNOSIS — I739 Peripheral vascular disease, unspecified: Secondary | ICD-10-CM

## 2012-07-25 DIAGNOSIS — N289 Disorder of kidney and ureter, unspecified: Secondary | ICD-10-CM

## 2012-07-25 LAB — CBC WITH DIFFERENTIAL/PLATELET
BASO%: 0.6 % (ref 0.0–2.0)
Basophils Absolute: 0 10*3/uL (ref 0.0–0.1)
EOS%: 6.2 % (ref 0.0–7.0)
HGB: 10.6 g/dL — ABNORMAL LOW (ref 11.6–15.9)
MCH: 34.4 pg — ABNORMAL HIGH (ref 25.1–34.0)
RDW: 14.4 % (ref 11.2–14.5)
WBC: 5.2 10*3/uL (ref 3.9–10.3)
lymph#: 0.7 10*3/uL — ABNORMAL LOW (ref 0.9–3.3)

## 2012-07-25 MED ORDER — DARBEPOETIN ALFA-POLYSORBATE 300 MCG/0.6ML IJ SOLN
300.0000 ug | Freq: Once | INTRAMUSCULAR | Status: AC
  Administered 2012-07-25: 300 ug via SUBCUTANEOUS
  Filled 2012-07-25: qty 0.6

## 2012-07-25 NOTE — Progress Notes (Signed)
Hematology and Oncology Follow Up Visit  [name] [id number] 09-12-1940 71 y.o. 07/25/2012 9:42 AM  CC: [name], M.D.    Principle Diagnosis: This is a 71 year old female with multifactorial anemia.  She has anemia of chronic disease, anemia of renal disease, diagnosed in 2011.  Current therapy:  Aranesp 300 mcg subcutaneously every 3 weeks keeping hemoglobin above 11.  Interim History: [name] presents today for a followup visit with her husband.  This is a very pleasant woman with a few comorbid conditions and history of renal insufficiency with a diagnosis of chronic anemia. She has been on Aranesp and have helped her overall quality of life and improvement in her performance status.  She had really reported some increase in her energy, increase in her appetite. She denies any recent falls but requires assistance from her husband for certain activities of daily living. She has not had any problems associated with Aranesp.  She had not had any injection or infusion problem and denied any toxicities. She has not noticed any bleeding or thrombosis.   Medications: I have reviewed the patient's current medications. Current outpatient prescriptions:ALPRAZolam (XANAX) 0.5 MG tablet, Take 0.5 mg by mouth daily., Disp: , Rfl: ;  amLODipine (NORVASC) 5 MG tablet, Take 1 tablet (5 mg total) by mouth daily., Disp: 1 tablet, Rfl: 0;  aspirin 81 MG tablet, Take 81 mg by mouth daily. , Disp: , Rfl: ;  carvedilol (COREG) 12.5 MG tablet, Take 1 tablet (12.5 mg total) by mouth 2 (two) times daily., Disp: 60 tablet, Rfl: 6 cyanocobalamin 1000 MCG tablet, Take 100 mcg by mouth daily., Disp: , Rfl: ;  metFORMIN (GLUCOPHAGE) 1000 MG tablet, Take 1,000 mg by mouth 2 (two) times daily with a meal., Disp: , Rfl: ;  Multiple Vitamin (MULTIVITAMIN) capsule, Take 1 capsule by mouth daily., Disp: , Rfl: ;  niacin (NIASPAN) 1000 MG CR tablet, Take 1,000 mg by mouth at bedtime. , Disp: , Rfl:  omeprazole  (PRILOSEC) 20 MG capsule, Take 20 mg by mouth daily. As needed, Disp: , Rfl: ;  rosuvastatin (CRESTOR) 5 MG tablet, Take 1 tablet (5 mg total) by mouth daily., Disp: 90 tablet, Rfl: 3;  timolol (BETIMOL) 0.5 % ophthalmic solution, 1 drop 2 (two) times daily., Disp: , Rfl: ;  [DISCONTINUED] metoprolol succinate (TOPROL-XL) 50 MG 24 hr tablet, Take 50 mg by mouth 2 (two) times daily. Take with or immediately following a meal., Disp: , Rfl:  No current facility-administered medications for this visit. Facility-Administered Medications Ordered in Other Visits: darbepoetin (ARANESP) injection 300 mcg, 300 mcg, Subcutaneous, Once, Maryanna Shape, NP  Allergies: No Known Allergies  Past Medical History, Surgical history, Social history, and Family History were reviewed and updated.  Review of Systems: Constitutional:  Negative for fever, chills, night sweats, anorexia, weight loss, pain. Cardiovascular: no chest pain or dyspnea on exertion Respiratory: no cough, shortness of breath, or wheezing Neurological: no TIA or stroke symptoms Dermatological: negative ENT: negative Skin: Negative. Gastrointestinal: no abdominal pain, change in bowel habits, or black or bloody stools Genito-Urinary: no dysuria, trouble voiding, or hematuria Hematological and Lymphatic: negative Breast: negative Musculoskeletal: negative Remaining ROS negative.  Physical Exam: Blood pressure 140/73, pulse 73, temperature 97.5 F (36.4 C), temperature source Oral, resp. rate 18, height 5\' 8"  (1.727 m), weight 138 lb 4.8 oz (62.732 kg). ECOG: 1 General appearance: alert Head: Normocephalic, without obvious abnormality, atraumatic Neck: no adenopathy, no carotid bruit, no JVD, supple, symmetrical, trachea midline and  thyroid not enlarged, symmetric, no tenderness/mass/nodules Lymph nodes: Cervical, supraclavicular, and axillary nodes normal. Heart:regular rate and rhythm, S1, S2 normal, no murmur, click, rub or  gallop Lung:chest clear, no wheezing, rales, normal symmetric air entry Abdomen: soft, non-tender, without masses or organomegaly EXT:no erythema, induration, or nodules  Lab Results: Lab Results  Component Value Date   WBC 5.2 07/25/2012   HGB 10.6* 07/25/2012   HCT 31.6* 07/25/2012   MCV 102.8* 07/25/2012   PLT 95* 07/25/2012   Impression and Plan:  A 71 year old female with the following issues:  1.     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  We will keep her on Aranesp, keep her hemoglobin above 11. Hemoglobin is 10.6. Aranesp will be given today. 2.     Thrombocytopenia.  Her platelet count is stable. No major bleeding at this time. However, we will continue to monitor her blood counts closely.  The likelihood of possibly developing myelodysplastic syndrome is always a possibility.  3.     Follow-up: CBC and Aranesp injection every 3 weeks. Visit in about 4 months.   [facility] 12/11/20139:42 AM

## 2012-07-25 NOTE — Telephone Encounter (Signed)
appts made and printed for pt aom °

## 2012-08-10 ENCOUNTER — Ambulatory Visit: Payer: Medicare Other | Admitting: Oncology

## 2012-08-10 ENCOUNTER — Other Ambulatory Visit: Payer: Medicare Other | Admitting: Lab

## 2012-08-16 ENCOUNTER — Ambulatory Visit: Payer: Medicare Other

## 2012-08-16 ENCOUNTER — Other Ambulatory Visit (HOSPITAL_BASED_OUTPATIENT_CLINIC_OR_DEPARTMENT_OTHER): Payer: Medicare Other | Admitting: Lab

## 2012-08-16 ENCOUNTER — Telehealth: Payer: Self-pay | Admitting: *Deleted

## 2012-08-16 DIAGNOSIS — D649 Anemia, unspecified: Secondary | ICD-10-CM

## 2012-08-16 LAB — COMPREHENSIVE METABOLIC PANEL (CC13)
ALT: 10 U/L (ref 0–55)
Albumin: 3.2 g/dL — ABNORMAL LOW (ref 3.5–5.0)
CO2: 21 mEq/L — ABNORMAL LOW (ref 22–29)
Chloride: 101 mEq/L (ref 98–107)
Glucose: 142 mg/dl — ABNORMAL HIGH (ref 70–99)
Potassium: 4.6 mEq/L (ref 3.5–5.1)
Sodium: 135 mEq/L — ABNORMAL LOW (ref 136–145)
Total Protein: 6.5 g/dL (ref 6.4–8.3)

## 2012-08-16 LAB — CBC WITH DIFFERENTIAL/PLATELET
Eosinophils Absolute: 0.4 10*3/uL (ref 0.0–0.5)
MONO#: 0.9 10*3/uL (ref 0.1–0.9)
NEUT#: 4.1 10*3/uL (ref 1.5–6.5)
Platelets: 92 10*3/uL — ABNORMAL LOW (ref 145–400)
RBC: 3.17 10*6/uL — ABNORMAL LOW (ref 3.70–5.45)
RDW: 14.6 % — ABNORMAL HIGH (ref 11.2–14.5)
WBC: 6.2 10*3/uL (ref 3.9–10.3)
lymph#: 0.7 10*3/uL — ABNORMAL LOW (ref 0.9–3.3)

## 2012-08-16 NOTE — Telephone Encounter (Signed)
Spoke with patient in lobby.  She is here for scheduled appt. For aranesp.  Hgb does not meet parameters (11.1 today).  Pt. Given copy of lab and reviewed date of next appt.

## 2012-09-05 ENCOUNTER — Other Ambulatory Visit (HOSPITAL_BASED_OUTPATIENT_CLINIC_OR_DEPARTMENT_OTHER): Payer: Medicare Other | Admitting: Lab

## 2012-09-05 ENCOUNTER — Ambulatory Visit (HOSPITAL_BASED_OUTPATIENT_CLINIC_OR_DEPARTMENT_OTHER): Payer: Medicare Other

## 2012-09-05 VITALS — BP 112/62 | HR 68 | Temp 97.0°F

## 2012-09-05 DIAGNOSIS — I739 Peripheral vascular disease, unspecified: Secondary | ICD-10-CM

## 2012-09-05 DIAGNOSIS — M545 Low back pain, unspecified: Secondary | ICD-10-CM

## 2012-09-05 DIAGNOSIS — D649 Anemia, unspecified: Secondary | ICD-10-CM

## 2012-09-05 DIAGNOSIS — I359 Nonrheumatic aortic valve disorder, unspecified: Secondary | ICD-10-CM

## 2012-09-05 DIAGNOSIS — I5040 Unspecified combined systolic (congestive) and diastolic (congestive) heart failure: Secondary | ICD-10-CM

## 2012-09-05 DIAGNOSIS — K219 Gastro-esophageal reflux disease without esophagitis: Secondary | ICD-10-CM

## 2012-09-05 DIAGNOSIS — G4700 Insomnia, unspecified: Secondary | ICD-10-CM

## 2012-09-05 DIAGNOSIS — D696 Thrombocytopenia, unspecified: Secondary | ICD-10-CM

## 2012-09-05 DIAGNOSIS — I2510 Atherosclerotic heart disease of native coronary artery without angina pectoris: Secondary | ICD-10-CM

## 2012-09-05 DIAGNOSIS — I10 Essential (primary) hypertension: Secondary | ICD-10-CM

## 2012-09-05 DIAGNOSIS — I6529 Occlusion and stenosis of unspecified carotid artery: Secondary | ICD-10-CM

## 2012-09-05 DIAGNOSIS — F411 Generalized anxiety disorder: Secondary | ICD-10-CM

## 2012-09-05 DIAGNOSIS — E785 Hyperlipidemia, unspecified: Secondary | ICD-10-CM

## 2012-09-05 DIAGNOSIS — N189 Chronic kidney disease, unspecified: Secondary | ICD-10-CM

## 2012-09-05 DIAGNOSIS — E118 Type 2 diabetes mellitus with unspecified complications: Secondary | ICD-10-CM

## 2012-09-05 DIAGNOSIS — I5032 Chronic diastolic (congestive) heart failure: Secondary | ICD-10-CM

## 2012-09-05 LAB — CBC WITH DIFFERENTIAL/PLATELET
BASO%: 0.6 % (ref 0.0–2.0)
Eosinophils Absolute: 0.5 10*3/uL (ref 0.0–0.5)
MCHC: 33.2 g/dL (ref 31.5–36.0)
MONO#: 1.2 10*3/uL — ABNORMAL HIGH (ref 0.1–0.9)
MONO%: 15.5 % — ABNORMAL HIGH (ref 0.0–14.0)
NEUT#: 4.8 10*3/uL (ref 1.5–6.5)
RBC: 2.85 10*6/uL — ABNORMAL LOW (ref 3.70–5.45)
RDW: 13.7 % (ref 11.2–14.5)
WBC: 7.5 10*3/uL (ref 3.9–10.3)

## 2012-09-05 MED ORDER — DARBEPOETIN ALFA-POLYSORBATE 300 MCG/0.6ML IJ SOLN
300.0000 ug | Freq: Once | INTRAMUSCULAR | Status: AC
  Administered 2012-09-05: 300 ug via SUBCUTANEOUS
  Filled 2012-09-05: qty 0.6

## 2012-09-26 ENCOUNTER — Ambulatory Visit (HOSPITAL_BASED_OUTPATIENT_CLINIC_OR_DEPARTMENT_OTHER): Payer: Medicare Other

## 2012-09-26 ENCOUNTER — Other Ambulatory Visit (HOSPITAL_BASED_OUTPATIENT_CLINIC_OR_DEPARTMENT_OTHER): Payer: Medicare Other | Admitting: Lab

## 2012-09-26 VITALS — BP 115/59 | HR 70 | Temp 97.6°F

## 2012-09-26 DIAGNOSIS — K219 Gastro-esophageal reflux disease without esophagitis: Secondary | ICD-10-CM

## 2012-09-26 DIAGNOSIS — I5032 Chronic diastolic (congestive) heart failure: Secondary | ICD-10-CM

## 2012-09-26 DIAGNOSIS — E785 Hyperlipidemia, unspecified: Secondary | ICD-10-CM

## 2012-09-26 DIAGNOSIS — I6529 Occlusion and stenosis of unspecified carotid artery: Secondary | ICD-10-CM

## 2012-09-26 DIAGNOSIS — I2510 Atherosclerotic heart disease of native coronary artery without angina pectoris: Secondary | ICD-10-CM

## 2012-09-26 DIAGNOSIS — D649 Anemia, unspecified: Secondary | ICD-10-CM

## 2012-09-26 DIAGNOSIS — I10 Essential (primary) hypertension: Secondary | ICD-10-CM

## 2012-09-26 DIAGNOSIS — I5040 Unspecified combined systolic (congestive) and diastolic (congestive) heart failure: Secondary | ICD-10-CM

## 2012-09-26 DIAGNOSIS — I739 Peripheral vascular disease, unspecified: Secondary | ICD-10-CM

## 2012-09-26 DIAGNOSIS — F411 Generalized anxiety disorder: Secondary | ICD-10-CM

## 2012-09-26 DIAGNOSIS — N189 Chronic kidney disease, unspecified: Secondary | ICD-10-CM

## 2012-09-26 DIAGNOSIS — G4700 Insomnia, unspecified: Secondary | ICD-10-CM

## 2012-09-26 DIAGNOSIS — D696 Thrombocytopenia, unspecified: Secondary | ICD-10-CM

## 2012-09-26 DIAGNOSIS — E118 Type 2 diabetes mellitus with unspecified complications: Secondary | ICD-10-CM

## 2012-09-26 DIAGNOSIS — I359 Nonrheumatic aortic valve disorder, unspecified: Secondary | ICD-10-CM

## 2012-09-26 DIAGNOSIS — M545 Low back pain, unspecified: Secondary | ICD-10-CM

## 2012-09-26 LAB — CBC WITH DIFFERENTIAL/PLATELET
BASO%: 0.9 % (ref 0.0–2.0)
Eosinophils Absolute: 0.4 10*3/uL (ref 0.0–0.5)
HCT: 25.8 % — ABNORMAL LOW (ref 34.8–46.6)
LYMPH%: 18.9 % (ref 14.0–49.7)
MONO#: 0.6 10*3/uL (ref 0.1–0.9)
NEUT#: 2.7 10*3/uL (ref 1.5–6.5)
Platelets: 94 10*3/uL — ABNORMAL LOW (ref 145–400)
RBC: 2.47 10*6/uL — ABNORMAL LOW (ref 3.70–5.45)
WBC: 4.7 10*3/uL (ref 3.9–10.3)
lymph#: 0.9 10*3/uL (ref 0.9–3.3)

## 2012-09-26 MED ORDER — DARBEPOETIN ALFA-POLYSORBATE 300 MCG/0.6ML IJ SOLN
300.0000 ug | Freq: Once | INTRAMUSCULAR | Status: AC
  Administered 2012-09-26: 300 ug via SUBCUTANEOUS
  Filled 2012-09-26: qty 0.6

## 2012-10-17 ENCOUNTER — Encounter: Payer: Self-pay | Admitting: Oncology

## 2012-10-17 ENCOUNTER — Other Ambulatory Visit (HOSPITAL_BASED_OUTPATIENT_CLINIC_OR_DEPARTMENT_OTHER): Payer: Medicare Other | Admitting: Lab

## 2012-10-17 ENCOUNTER — Telehealth: Payer: Self-pay | Admitting: Oncology

## 2012-10-17 ENCOUNTER — Ambulatory Visit (HOSPITAL_BASED_OUTPATIENT_CLINIC_OR_DEPARTMENT_OTHER): Payer: Medicare Other | Admitting: Oncology

## 2012-10-17 ENCOUNTER — Ambulatory Visit (HOSPITAL_BASED_OUTPATIENT_CLINIC_OR_DEPARTMENT_OTHER): Payer: Medicare Other

## 2012-10-17 VITALS — BP 126/72 | HR 52 | Temp 97.3°F | Resp 20 | Ht 68.0 in | Wt 129.8 lb

## 2012-10-17 DIAGNOSIS — D649 Anemia, unspecified: Secondary | ICD-10-CM

## 2012-10-17 DIAGNOSIS — D696 Thrombocytopenia, unspecified: Secondary | ICD-10-CM

## 2012-10-17 DIAGNOSIS — I359 Nonrheumatic aortic valve disorder, unspecified: Secondary | ICD-10-CM

## 2012-10-17 DIAGNOSIS — I2510 Atherosclerotic heart disease of native coronary artery without angina pectoris: Secondary | ICD-10-CM

## 2012-10-17 DIAGNOSIS — M545 Low back pain, unspecified: Secondary | ICD-10-CM

## 2012-10-17 DIAGNOSIS — E118 Type 2 diabetes mellitus with unspecified complications: Secondary | ICD-10-CM

## 2012-10-17 DIAGNOSIS — F411 Generalized anxiety disorder: Secondary | ICD-10-CM

## 2012-10-17 DIAGNOSIS — K219 Gastro-esophageal reflux disease without esophagitis: Secondary | ICD-10-CM

## 2012-10-17 DIAGNOSIS — I5040 Unspecified combined systolic (congestive) and diastolic (congestive) heart failure: Secondary | ICD-10-CM

## 2012-10-17 DIAGNOSIS — I5032 Chronic diastolic (congestive) heart failure: Secondary | ICD-10-CM

## 2012-10-17 DIAGNOSIS — I6529 Occlusion and stenosis of unspecified carotid artery: Secondary | ICD-10-CM

## 2012-10-17 DIAGNOSIS — G4700 Insomnia, unspecified: Secondary | ICD-10-CM

## 2012-10-17 DIAGNOSIS — E785 Hyperlipidemia, unspecified: Secondary | ICD-10-CM

## 2012-10-17 DIAGNOSIS — N189 Chronic kidney disease, unspecified: Secondary | ICD-10-CM

## 2012-10-17 DIAGNOSIS — I739 Peripheral vascular disease, unspecified: Secondary | ICD-10-CM

## 2012-10-17 DIAGNOSIS — I10 Essential (primary) hypertension: Secondary | ICD-10-CM

## 2012-10-17 LAB — CBC WITH DIFFERENTIAL/PLATELET
Basophils Absolute: 0 10*3/uL (ref 0.0–0.1)
Eosinophils Absolute: 0.4 10*3/uL (ref 0.0–0.5)
HGB: 10.3 g/dL — ABNORMAL LOW (ref 11.6–15.9)
LYMPH%: 22 % (ref 14.0–49.7)
MCH: 33 pg (ref 25.1–34.0)
MCV: 106.4 fL — ABNORMAL HIGH (ref 79.5–101.0)
MONO%: 10.2 % (ref 0.0–14.0)
NEUT#: 3.2 10*3/uL (ref 1.5–6.5)
Platelets: 96 10*3/uL — ABNORMAL LOW (ref 145–400)
RBC: 3.12 10*6/uL — ABNORMAL LOW (ref 3.70–5.45)

## 2012-10-17 MED ORDER — DARBEPOETIN ALFA-POLYSORBATE 300 MCG/0.6ML IJ SOLN
300.0000 ug | Freq: Once | INTRAMUSCULAR | Status: AC
  Administered 2012-10-17: 300 ug via SUBCUTANEOUS
  Filled 2012-10-17: qty 0.6

## 2012-10-17 NOTE — Progress Notes (Signed)
Hematology and Oncology Follow Up Visit  [name] [id number] [birth year] 72 y.o. 10/17/2012 10:57 AM  CC: [name], M.D.    Principle Diagnosis: This is a 72 year old female with multifactorial anemia.  She has anemia of chronic disease, anemia of renal disease, diagnosed in 2011.  Current therapy:  Aranesp 300 mcg subcutaneously every 3 weeks keeping hemoglobin above 11.  Interim History: [name] presents today for a followup visit with her husband.  This is a very pleasant woman with a few comorbid conditions and history of renal insufficiency with a diagnosis of chronic anemia. She has been on Aranesp and have helped her overall quality of life and improvement in her performance status.  She had really reported some increase in her energy, increase in her appetite. She denies any recent falls but requires assistance from her husband for certain activities of daily living. She has not had any problems associated with Aranesp.  She had not had any injection or infusion problem and denied any toxicities. She has not noticed any bleeding or thrombosis.   Medications: I have reviewed the patient's current medications. Current outpatient prescriptions:ALPRAZolam (XANAX) 0.5 MG tablet, Take 0.5 mg by mouth daily., Disp: , Rfl: ;  amLODipine (NORVASC) 5 MG tablet, Take 1 tablet (5 mg total) by mouth daily., Disp: 1 tablet, Rfl: 0;  aspirin 81 MG tablet, Take 81 mg by mouth daily. , Disp: , Rfl: ;  carvedilol (COREG) 12.5 MG tablet, Take 1 tablet (12.5 mg total) by mouth 2 (two) times daily., Disp: 60 tablet, Rfl: 6 cyanocobalamin 1000 MCG tablet, Take 100 mcg by mouth daily., Disp: , Rfl: ;  metFORMIN (GLUCOPHAGE) 1000 MG tablet, Take 500 mg by mouth 2 (two) times daily with a meal. , Disp: , Rfl: ;  Multiple Vitamin (MULTIVITAMIN) capsule, Take 1 capsule by mouth daily., Disp: , Rfl: ;  niacin (NIASPAN) 1000 MG CR tablet, Take 1,000 mg by mouth at bedtime. , Disp: , Rfl:  omeprazole (PRILOSEC)  20 MG capsule, Take 20 mg by mouth daily. As needed, Disp: , Rfl: ;  rosuvastatin (CRESTOR) 5 MG tablet, Take 1 tablet (5 mg total) by mouth daily., Disp: 90 tablet, Rfl: 3;  timolol (BETIMOL) 0.5 % ophthalmic solution, 1 drop 2 (two) times daily., Disp: , Rfl: ;  [DISCONTINUED] metoprolol succinate (TOPROL-XL) 50 MG 24 hr tablet, Take 50 mg by mouth 2 (two) times daily. Take with or immediately following a meal., Disp: , Rfl:   Allergies: No Known Allergies  Past Medical History, Surgical history, Social history, and Family History were reviewed and updated.  Review of Systems: Constitutional:  Negative for fever, chills, night sweats, anorexia, weight loss, pain. Cardiovascular: no chest pain or dyspnea on exertion Respiratory: no cough, shortness of breath, or wheezing Neurological: no TIA or stroke symptoms Dermatological: negative ENT: negative Skin: Negative. Gastrointestinal: no abdominal pain, change in bowel habits, or black or bloody stools Genito-Urinary: no dysuria, trouble voiding, or hematuria Hematological and Lymphatic: negative Breast: negative Musculoskeletal: negative Remaining ROS negative.  Physical Exam: Blood pressure 126/72, pulse 52, temperature 97.3 F (36.3 C), temperature source Oral, resp. rate 20, height 5\' 8"  (1.727 m), weight 129 lb 12.8 oz (58.877 kg). ECOG: 1 General appearance: alert Head: Normocephalic, without obvious abnormality, atraumatic Neck: no adenopathy, no carotid bruit, no JVD, supple, symmetrical, trachea midline and thyroid not enlarged, symmetric, no tenderness/mass/nodules Lymph nodes: Cervical, supraclavicular, and axillary nodes normal. Heart:regular rate and rhythm, S1, S2 normal, no murmur, click, rub  or gallop Lung:chest clear, no wheezing, rales, normal symmetric air entry Abdomen: soft, non-tender, without masses or organomegaly EXT:no erythema, induration, or nodules  Lab Results: Lab Results  Component Value Date   WBC  5.4 10/17/2012   HGB 10.3* 10/17/2012   HCT 33.2* 10/17/2012   MCV 106.4* 10/17/2012   PLT 96* 10/17/2012   Impression and Plan:  A 72 year old female with the following issues:  1.     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  We will keep her on Aranesp, keep her hemoglobin above 11. Hemoglobin is 10.3. Aranesp will be given today. 2.     Thrombocytopenia.  Her platelet count is stable. No major bleeding at this time. However, we will continue to monitor her blood counts closely.  The likelihood of possibly developing myelodysplastic syndrome is always a possibility.  3.     Follow-up: CBC and Aranesp injection every 3 weeks. Visit in about 4 months.   Timken, KRISTIN 3/5/201410:57 AM

## 2012-10-17 NOTE — Telephone Encounter (Signed)
gv and printed pt appt schedule for March thru June

## 2012-11-01 ENCOUNTER — Encounter: Payer: Self-pay | Admitting: Cardiology

## 2012-11-07 ENCOUNTER — Other Ambulatory Visit (HOSPITAL_BASED_OUTPATIENT_CLINIC_OR_DEPARTMENT_OTHER): Payer: Medicare Other | Admitting: Lab

## 2012-11-07 ENCOUNTER — Ambulatory Visit (HOSPITAL_BASED_OUTPATIENT_CLINIC_OR_DEPARTMENT_OTHER): Payer: Medicare Other

## 2012-11-07 VITALS — BP 128/50 | HR 66 | Temp 97.7°F

## 2012-11-07 DIAGNOSIS — M545 Low back pain, unspecified: Secondary | ICD-10-CM

## 2012-11-07 DIAGNOSIS — D649 Anemia, unspecified: Secondary | ICD-10-CM

## 2012-11-07 DIAGNOSIS — G4700 Insomnia, unspecified: Secondary | ICD-10-CM

## 2012-11-07 DIAGNOSIS — E118 Type 2 diabetes mellitus with unspecified complications: Secondary | ICD-10-CM

## 2012-11-07 DIAGNOSIS — K219 Gastro-esophageal reflux disease without esophagitis: Secondary | ICD-10-CM

## 2012-11-07 DIAGNOSIS — F411 Generalized anxiety disorder: Secondary | ICD-10-CM

## 2012-11-07 DIAGNOSIS — N189 Chronic kidney disease, unspecified: Secondary | ICD-10-CM

## 2012-11-07 DIAGNOSIS — I359 Nonrheumatic aortic valve disorder, unspecified: Secondary | ICD-10-CM

## 2012-11-07 DIAGNOSIS — I5040 Unspecified combined systolic (congestive) and diastolic (congestive) heart failure: Secondary | ICD-10-CM

## 2012-11-07 DIAGNOSIS — I5032 Chronic diastolic (congestive) heart failure: Secondary | ICD-10-CM

## 2012-11-07 DIAGNOSIS — I2510 Atherosclerotic heart disease of native coronary artery without angina pectoris: Secondary | ICD-10-CM

## 2012-11-07 DIAGNOSIS — D696 Thrombocytopenia, unspecified: Secondary | ICD-10-CM

## 2012-11-07 DIAGNOSIS — I10 Essential (primary) hypertension: Secondary | ICD-10-CM

## 2012-11-07 DIAGNOSIS — I739 Peripheral vascular disease, unspecified: Secondary | ICD-10-CM

## 2012-11-07 DIAGNOSIS — E785 Hyperlipidemia, unspecified: Secondary | ICD-10-CM

## 2012-11-07 LAB — CBC WITH DIFFERENTIAL/PLATELET
BASO%: 0.7 % (ref 0.0–2.0)
EOS%: 8.9 % — ABNORMAL HIGH (ref 0.0–7.0)
HCT: 28.2 % — ABNORMAL LOW (ref 34.8–46.6)
LYMPH%: 14.8 % (ref 14.0–49.7)
MCH: 35.1 pg — ABNORMAL HIGH (ref 25.1–34.0)
MCHC: 33.8 g/dL (ref 31.5–36.0)
MONO%: 10.6 % (ref 0.0–14.0)
NEUT%: 65 % (ref 38.4–76.8)
Platelets: 90 10*3/uL — ABNORMAL LOW (ref 145–400)

## 2012-11-07 MED ORDER — DARBEPOETIN ALFA-POLYSORBATE 300 MCG/0.6ML IJ SOLN
300.0000 ug | Freq: Once | INTRAMUSCULAR | Status: AC
  Administered 2012-11-07: 300 ug via SUBCUTANEOUS
  Filled 2012-11-07: qty 0.6

## 2012-11-09 ENCOUNTER — Ambulatory Visit: Payer: Medicare Other | Admitting: Cardiology

## 2012-11-28 ENCOUNTER — Ambulatory Visit (HOSPITAL_BASED_OUTPATIENT_CLINIC_OR_DEPARTMENT_OTHER): Payer: Medicare Other

## 2012-11-28 ENCOUNTER — Other Ambulatory Visit (HOSPITAL_BASED_OUTPATIENT_CLINIC_OR_DEPARTMENT_OTHER): Payer: Medicare Other | Admitting: Lab

## 2012-11-28 VITALS — BP 148/67 | HR 67 | Temp 97.5°F

## 2012-11-28 DIAGNOSIS — I2510 Atherosclerotic heart disease of native coronary artery without angina pectoris: Secondary | ICD-10-CM

## 2012-11-28 DIAGNOSIS — I10 Essential (primary) hypertension: Secondary | ICD-10-CM

## 2012-11-28 DIAGNOSIS — I739 Peripheral vascular disease, unspecified: Secondary | ICD-10-CM

## 2012-11-28 DIAGNOSIS — E118 Type 2 diabetes mellitus with unspecified complications: Secondary | ICD-10-CM

## 2012-11-28 DIAGNOSIS — D696 Thrombocytopenia, unspecified: Secondary | ICD-10-CM

## 2012-11-28 DIAGNOSIS — K219 Gastro-esophageal reflux disease without esophagitis: Secondary | ICD-10-CM

## 2012-11-28 DIAGNOSIS — M545 Low back pain, unspecified: Secondary | ICD-10-CM

## 2012-11-28 DIAGNOSIS — I6529 Occlusion and stenosis of unspecified carotid artery: Secondary | ICD-10-CM

## 2012-11-28 DIAGNOSIS — I359 Nonrheumatic aortic valve disorder, unspecified: Secondary | ICD-10-CM

## 2012-11-28 DIAGNOSIS — I5040 Unspecified combined systolic (congestive) and diastolic (congestive) heart failure: Secondary | ICD-10-CM

## 2012-11-28 DIAGNOSIS — G4700 Insomnia, unspecified: Secondary | ICD-10-CM

## 2012-11-28 DIAGNOSIS — E785 Hyperlipidemia, unspecified: Secondary | ICD-10-CM

## 2012-11-28 DIAGNOSIS — I5032 Chronic diastolic (congestive) heart failure: Secondary | ICD-10-CM

## 2012-11-28 DIAGNOSIS — N189 Chronic kidney disease, unspecified: Secondary | ICD-10-CM

## 2012-11-28 DIAGNOSIS — D649 Anemia, unspecified: Secondary | ICD-10-CM

## 2012-11-28 DIAGNOSIS — F411 Generalized anxiety disorder: Secondary | ICD-10-CM

## 2012-11-28 LAB — CBC WITH DIFFERENTIAL/PLATELET
EOS%: 0.4 % (ref 0.0–7.0)
Eosinophils Absolute: 0 10*3/uL (ref 0.0–0.5)
MCV: 103 fL — ABNORMAL HIGH (ref 79.5–101.0)
MONO%: 12.4 % (ref 0.0–14.0)
NEUT#: 5.3 10*3/uL (ref 1.5–6.5)
RBC: 3.05 10*6/uL — ABNORMAL LOW (ref 3.70–5.45)
RDW: 14.5 % (ref 11.2–14.5)

## 2012-11-28 MED ORDER — DARBEPOETIN ALFA-POLYSORBATE 300 MCG/0.6ML IJ SOLN
300.0000 ug | Freq: Once | INTRAMUSCULAR | Status: AC
  Administered 2012-11-28: 300 ug via SUBCUTANEOUS
  Filled 2012-11-28: qty 0.6

## 2012-12-11 ENCOUNTER — Encounter: Payer: Self-pay | Admitting: Cardiology

## 2012-12-11 ENCOUNTER — Encounter (INDEPENDENT_AMBULATORY_CARE_PROVIDER_SITE_OTHER): Payer: Medicare Other

## 2012-12-11 ENCOUNTER — Ambulatory Visit (INDEPENDENT_AMBULATORY_CARE_PROVIDER_SITE_OTHER): Payer: Medicare Other | Admitting: Cardiology

## 2012-12-11 VITALS — BP 130/70 | HR 63 | Ht 68.0 in | Wt 132.0 lb

## 2012-12-11 DIAGNOSIS — I6529 Occlusion and stenosis of unspecified carotid artery: Secondary | ICD-10-CM

## 2012-12-11 DIAGNOSIS — I2510 Atherosclerotic heart disease of native coronary artery without angina pectoris: Secondary | ICD-10-CM

## 2012-12-11 DIAGNOSIS — I359 Nonrheumatic aortic valve disorder, unspecified: Secondary | ICD-10-CM

## 2012-12-11 DIAGNOSIS — I350 Nonrheumatic aortic (valve) stenosis: Secondary | ICD-10-CM

## 2012-12-11 DIAGNOSIS — I10 Essential (primary) hypertension: Secondary | ICD-10-CM

## 2012-12-11 DIAGNOSIS — I5040 Unspecified combined systolic (congestive) and diastolic (congestive) heart failure: Secondary | ICD-10-CM

## 2012-12-11 NOTE — Patient Instructions (Addendum)
Your physician wants you to follow-up in: 1 year with Dr Aundra Dubin. (April 2015). You will receive a reminder letter in the mail two months in advance. If you don't receive a letter, please call our office to schedule the follow-up appointment.    Your physician has requested that you have an echocardiogram. Echocardiography is a painless test that uses sound waves to create images of your heart. It provides your doctor with information about the size and shape of your heart and how well your heart's chambers and valves are working. This procedure takes approximately one hour. There are no restrictions for this procedure. In 1 year when you see Dr Aundra Dubin in April 2015. You can schedule these appointments on the same day.

## 2012-12-11 NOTE — Progress Notes (Signed)
Patient ID: [name], female   DOB: [birth year], 72 y.o.   MRN: [id number] PCP: Dr. Kathryne Eriksson  72 yo with h/o CAD s/p CABG and diastolic CHF presents for followup.  She is doing fairly well.  She can walk on flat ground without dyspnea.  She does have some mild shortness of breath walking up steps.  No chest pain, no lightheadedness or palpitations.  She has balance difficulty but has had no recent falls.  She is walking with a cane.  She continues to follow with hematology for anemia and thrombocytopenia.  She gets Aranesp injections.  Overall, stable clinically.   ECG: NSR, old ASMI  Labs (4/10): creatinine 1.9  Labs (7/10): LDL 49, HDL 51, LFTs normal  Labs (1/11): K 5.0, creatinine 1.4  Labs (2/11): LDL 51, HDL 62, HCT 27  Labs (5/12): K 4.4, creatinine 1.77 Labs (2/13): HCT 36.5 Labs (5/13): K 4.4, creatinine 1.9 Labs (7/13): HDL 67, LDL 32 Labs (8/13): HCT 33.5, plts 67 Labs (11/13): K 4.7, creatinine 2.1, LDL 37, HDL 73 Labs (4/14): HCT 31.4   Allergies (verified):  No Known Drug Allergies   Past Medical History:  1. CAD: Pt presented 2/10 to [facility] with NSTEMI and diastolic CHF exacerbation. LHC was done 3/10 showing 99% pRCA stenosis and 80% calcified pLAD stenosis with L=>R collaterals. Pt was referred for CABG which was done by Dr. Prescott Gum with LIMA-LAD, SVG-RCA, SVG-OM.  2. Diastolic CHF: Echo ([id number]) showed EF 55-65%, mild LVH, diastolic dysfunction, mild AS with mean gradient 12 mmHg, PASP 43 mmHg. Echo (2/12): EF 55-60%, mild LVH, mild AS (mean gradient 12), PA systolic pressure 32 mmHg.  3. CKD 4. DM2  5. HTN  6. Hyperlipidemia: nausea/GI discomfort with atorvastatin, ? Balance problems with pravastatin.  7. Anxiety  8. Chronic low back pain  9. History of thrombocytopenia  10. 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  Followed by hematology, getting Aranesp injections 11. Mild aortic stenosis: mean gradient 12 mmHg in 2/12.  12. Carotid  stenosis: 40-59% bilateral ICA stenosis in 99991111. 123456 LICA stenosis in A999333.   13. PAD: ABIs 3/12 were normal but TBIs were mildly decreased.  ABIs/TBIs (10/13) normal.   Family History:  Pt was adopted, does not know biological family's history.   Social History:  Married, lives in [place]. Retired from [employer]. Quit smoking around 1990. Occasional ETOH. Has 1 son.   Review of Systems  All systems reviewed and negative except as per HPI.   Current Outpatient Prescriptions  Medication Sig Dispense Refill  . ALPRAZolam (XANAX) 0.5 MG tablet Take 0.5 mg by mouth daily.      [name] amLODipine (NORVASC) 5 MG tablet Take 1 tablet (5 mg total) by mouth daily.  1 tablet  0  . aspirin 81 MG tablet Take 81 mg by mouth daily.       . carvedilol (COREG) 12.5 MG tablet Take 1 tablet (12.5 mg total) by mouth 2 (two) times daily.  60 tablet  6  . cyanocobalamin 1000 MCG tablet Take 100 mcg by mouth daily.      . metFORMIN (GLUCOPHAGE) 1000 MG tablet Take 500 mg by mouth 2 (two) times daily with a meal.       . Multiple Vitamin (MULTIVITAMIN) capsule Take 1 capsule by mouth daily.      . niacin (NIASPAN) 1000 MG CR tablet Take 1,000 mg by mouth at bedtime.       [name] omeprazole (PRILOSEC) 20  MG capsule Take 20 mg by mouth daily. As needed      . rosuvastatin (CRESTOR) 5 MG tablet Take 1 tablet (5 mg total) by mouth daily.  90 tablet  3  . timolol (BETIMOL) 0.5 % ophthalmic solution 1 drop 2 (two) times daily.      . [DISCONTINUED] metoprolol succinate (TOPROL-XL) 50 MG 24 hr tablet Take 50 mg by mouth 2 (two) times daily. Take with or immediately following a meal.       No current facility-administered medications for this visit.    BP 130/70  Pulse 63  Ht 5\' 8"  (1.727 m)  Wt 132 lb (59.875 kg)  BMI 20.08 kg/m2 General: NAD Neck: No JVD, no thyromegaly or thyroid nodule.  Lungs: Clear to auscultation bilaterally with normal respiratory effort. CV: Nondisplaced PMI.  Heart regular S1/S2, no  S3/S4, 2/6 SEM RUSB, S2 heard clearly.  No edema.  Left carotid bruit.  Unable to palpate pedal pulses but feet warm.  Abdomen: Soft, nontender, no hepatosplenomegaly, no distention.  Neurologic: Alert and oriented x 3.  Psych: Normal affect. Extremities: No clubbing or cyanosis.   Assessment/Plan 1. CAD Status post CABG. No ischemic symptoms. Continue ASA 81, Coreg, and statin. 2. AORTIC STENOSIS  Mild aortic stenosis. Stable murmur and symptoms.  Will get echo at followup next year.  3. CAROTID ARTERY STENOSIS  Repeat carotid dopplers today. 4. PAD ABIs/TBIs done in 10/13 were normal. 5. HYPERTENSION BP is well-controlled.  6. Hyperlipidemia Good lipids when checked in 11/13.  She will have her PCP send [place] a copy of her labs when they are repeated next month.   Loralie Champagne 12/11/2012 11:10 AM

## 2012-12-11 NOTE — Addendum Note (Signed)
Addended by: Marlis Edelson C on: 12/11/2012 11:27 AM   Modules accepted: Orders

## 2012-12-15 ENCOUNTER — Other Ambulatory Visit: Payer: Self-pay | Admitting: Cardiology

## 2012-12-19 ENCOUNTER — Other Ambulatory Visit (HOSPITAL_BASED_OUTPATIENT_CLINIC_OR_DEPARTMENT_OTHER): Payer: Medicare Other | Admitting: Lab

## 2012-12-19 ENCOUNTER — Ambulatory Visit (HOSPITAL_BASED_OUTPATIENT_CLINIC_OR_DEPARTMENT_OTHER): Payer: Medicare Other

## 2012-12-19 VITALS — BP 151/53 | HR 59 | Temp 97.6°F

## 2012-12-19 DIAGNOSIS — I5032 Chronic diastolic (congestive) heart failure: Secondary | ICD-10-CM

## 2012-12-19 DIAGNOSIS — N189 Chronic kidney disease, unspecified: Secondary | ICD-10-CM

## 2012-12-19 DIAGNOSIS — F411 Generalized anxiety disorder: Secondary | ICD-10-CM

## 2012-12-19 DIAGNOSIS — D649 Anemia, unspecified: Secondary | ICD-10-CM

## 2012-12-19 DIAGNOSIS — I10 Essential (primary) hypertension: Secondary | ICD-10-CM

## 2012-12-19 DIAGNOSIS — K219 Gastro-esophageal reflux disease without esophagitis: Secondary | ICD-10-CM

## 2012-12-19 DIAGNOSIS — E118 Type 2 diabetes mellitus with unspecified complications: Secondary | ICD-10-CM

## 2012-12-19 DIAGNOSIS — I739 Peripheral vascular disease, unspecified: Secondary | ICD-10-CM

## 2012-12-19 DIAGNOSIS — D696 Thrombocytopenia, unspecified: Secondary | ICD-10-CM

## 2012-12-19 DIAGNOSIS — I2510 Atherosclerotic heart disease of native coronary artery without angina pectoris: Secondary | ICD-10-CM

## 2012-12-19 DIAGNOSIS — I359 Nonrheumatic aortic valve disorder, unspecified: Secondary | ICD-10-CM

## 2012-12-19 DIAGNOSIS — I5040 Unspecified combined systolic (congestive) and diastolic (congestive) heart failure: Secondary | ICD-10-CM

## 2012-12-19 DIAGNOSIS — M545 Low back pain, unspecified: Secondary | ICD-10-CM

## 2012-12-19 DIAGNOSIS — I6529 Occlusion and stenosis of unspecified carotid artery: Secondary | ICD-10-CM

## 2012-12-19 DIAGNOSIS — E785 Hyperlipidemia, unspecified: Secondary | ICD-10-CM

## 2012-12-19 DIAGNOSIS — G4700 Insomnia, unspecified: Secondary | ICD-10-CM

## 2012-12-19 LAB — CBC WITH DIFFERENTIAL/PLATELET
Eosinophils Absolute: 0.2 10*3/uL (ref 0.0–0.5)
MONO#: 0.5 10*3/uL (ref 0.1–0.9)
NEUT#: 2.8 10*3/uL (ref 1.5–6.5)
RBC: 3.27 10*6/uL — ABNORMAL LOW (ref 3.70–5.45)
RDW: 14.3 % (ref 11.2–14.5)
WBC: 4.8 10*3/uL (ref 3.9–10.3)
lymph#: 1.3 10*3/uL (ref 0.9–3.3)

## 2012-12-19 MED ORDER — DARBEPOETIN ALFA-POLYSORBATE 300 MCG/0.6ML IJ SOLN
300.0000 ug | Freq: Once | INTRAMUSCULAR | Status: AC
  Administered 2012-12-19: 300 ug via SUBCUTANEOUS
  Filled 2012-12-19: qty 0.6

## 2013-01-09 ENCOUNTER — Ambulatory Visit: Payer: Medicare Other

## 2013-01-09 ENCOUNTER — Other Ambulatory Visit (HOSPITAL_BASED_OUTPATIENT_CLINIC_OR_DEPARTMENT_OTHER): Payer: Medicare Other | Admitting: Lab

## 2013-01-09 DIAGNOSIS — I10 Essential (primary) hypertension: Secondary | ICD-10-CM

## 2013-01-09 DIAGNOSIS — I2510 Atherosclerotic heart disease of native coronary artery without angina pectoris: Secondary | ICD-10-CM

## 2013-01-09 DIAGNOSIS — N189 Chronic kidney disease, unspecified: Secondary | ICD-10-CM

## 2013-01-09 DIAGNOSIS — D649 Anemia, unspecified: Secondary | ICD-10-CM

## 2013-01-09 DIAGNOSIS — I739 Peripheral vascular disease, unspecified: Secondary | ICD-10-CM

## 2013-01-09 DIAGNOSIS — K219 Gastro-esophageal reflux disease without esophagitis: Secondary | ICD-10-CM

## 2013-01-09 DIAGNOSIS — I5032 Chronic diastolic (congestive) heart failure: Secondary | ICD-10-CM

## 2013-01-09 DIAGNOSIS — D696 Thrombocytopenia, unspecified: Secondary | ICD-10-CM

## 2013-01-09 DIAGNOSIS — M545 Low back pain, unspecified: Secondary | ICD-10-CM

## 2013-01-09 DIAGNOSIS — F411 Generalized anxiety disorder: Secondary | ICD-10-CM

## 2013-01-09 DIAGNOSIS — I5040 Unspecified combined systolic (congestive) and diastolic (congestive) heart failure: Secondary | ICD-10-CM

## 2013-01-09 DIAGNOSIS — I359 Nonrheumatic aortic valve disorder, unspecified: Secondary | ICD-10-CM

## 2013-01-09 DIAGNOSIS — E118 Type 2 diabetes mellitus with unspecified complications: Secondary | ICD-10-CM

## 2013-01-09 DIAGNOSIS — G4700 Insomnia, unspecified: Secondary | ICD-10-CM

## 2013-01-09 DIAGNOSIS — E785 Hyperlipidemia, unspecified: Secondary | ICD-10-CM

## 2013-01-09 LAB — CBC WITH DIFFERENTIAL/PLATELET
Basophils Absolute: 0 10*3/uL (ref 0.0–0.1)
EOS%: 3.6 % (ref 0.0–7.0)
MCH: 33.4 pg (ref 25.1–34.0)
MCHC: 32.4 g/dL (ref 31.5–36.0)
MCV: 103.2 fL — ABNORMAL HIGH (ref 79.5–101.0)
MONO%: 13.9 % (ref 0.0–14.0)
RBC: 3.4 10*6/uL — ABNORMAL LOW (ref 3.70–5.45)
RDW: 14.5 % (ref 11.2–14.5)

## 2013-01-09 MED ORDER — DARBEPOETIN ALFA-POLYSORBATE 300 MCG/0.6ML IJ SOLN: 300.0000 ug | Freq: Once | INTRAMUSCULAR | Status: DC

## 2013-01-30 ENCOUNTER — Ambulatory Visit: Payer: Medicare Other

## 2013-01-30 ENCOUNTER — Ambulatory Visit (HOSPITAL_BASED_OUTPATIENT_CLINIC_OR_DEPARTMENT_OTHER): Payer: Medicare Other | Admitting: Oncology

## 2013-01-30 ENCOUNTER — Other Ambulatory Visit (HOSPITAL_BASED_OUTPATIENT_CLINIC_OR_DEPARTMENT_OTHER): Payer: Medicare Other | Admitting: Lab

## 2013-01-30 ENCOUNTER — Telehealth: Payer: Self-pay | Admitting: Oncology

## 2013-01-30 VITALS — BP 133/51 | HR 63 | Temp 97.6°F | Resp 18 | Ht 68.0 in | Wt 133.8 lb

## 2013-01-30 DIAGNOSIS — M545 Low back pain, unspecified: Secondary | ICD-10-CM

## 2013-01-30 DIAGNOSIS — N289 Disorder of kidney and ureter, unspecified: Secondary | ICD-10-CM

## 2013-01-30 DIAGNOSIS — D696 Thrombocytopenia, unspecified: Secondary | ICD-10-CM

## 2013-01-30 DIAGNOSIS — F411 Generalized anxiety disorder: Secondary | ICD-10-CM

## 2013-01-30 DIAGNOSIS — I5032 Chronic diastolic (congestive) heart failure: Secondary | ICD-10-CM

## 2013-01-30 DIAGNOSIS — I6529 Occlusion and stenosis of unspecified carotid artery: Secondary | ICD-10-CM

## 2013-01-30 DIAGNOSIS — N189 Chronic kidney disease, unspecified: Secondary | ICD-10-CM

## 2013-01-30 DIAGNOSIS — I5040 Unspecified combined systolic (congestive) and diastolic (congestive) heart failure: Secondary | ICD-10-CM

## 2013-01-30 DIAGNOSIS — D649 Anemia, unspecified: Secondary | ICD-10-CM

## 2013-01-30 DIAGNOSIS — K219 Gastro-esophageal reflux disease without esophagitis: Secondary | ICD-10-CM

## 2013-01-30 DIAGNOSIS — E118 Type 2 diabetes mellitus with unspecified complications: Secondary | ICD-10-CM

## 2013-01-30 DIAGNOSIS — I2510 Atherosclerotic heart disease of native coronary artery without angina pectoris: Secondary | ICD-10-CM

## 2013-01-30 DIAGNOSIS — I10 Essential (primary) hypertension: Secondary | ICD-10-CM

## 2013-01-30 DIAGNOSIS — I359 Nonrheumatic aortic valve disorder, unspecified: Secondary | ICD-10-CM

## 2013-01-30 DIAGNOSIS — E785 Hyperlipidemia, unspecified: Secondary | ICD-10-CM

## 2013-01-30 DIAGNOSIS — I739 Peripheral vascular disease, unspecified: Secondary | ICD-10-CM

## 2013-01-30 DIAGNOSIS — G4700 Insomnia, unspecified: Secondary | ICD-10-CM

## 2013-01-30 LAB — CBC WITH DIFFERENTIAL/PLATELET
Eosinophils Absolute: 0.4 10*3/uL (ref 0.0–0.5)
MCV: 100.4 fL (ref 79.5–101.0)
MONO%: 12 % (ref 0.0–14.0)
NEUT#: 3 10*3/uL (ref 1.5–6.5)
RBC: 2.94 10*6/uL — ABNORMAL LOW (ref 3.70–5.45)
RDW: 13.3 % (ref 11.2–14.5)
WBC: 5.3 10*3/uL (ref 3.9–10.3)

## 2013-01-30 MED ORDER — DARBEPOETIN ALFA-POLYSORBATE 300 MCG/0.6ML IJ SOLN
300.0000 ug | Freq: Once | INTRAMUSCULAR | Status: AC
  Administered 2013-01-30: 300 ug via SUBCUTANEOUS
  Filled 2013-01-30: qty 0.6

## 2013-01-30 NOTE — Progress Notes (Signed)
Hematology and Oncology Follow Up Visit  [name] [id number] 07/08/1941 72 y.o. 01/30/2013 10:01 AM  CC: [name], M.D.    Principle Diagnosis: This is a 72 year old female with multifactorial anemia.  She has anemia of chronic disease, anemia of renal disease, diagnosed in 2011.  Current therapy:  Aranesp 300 mcg subcutaneously every 3 weeks keeping hemoglobin above 11.  Interim History: [name] presents today for a followup visit with her husband.  This is a very pleasant woman with a few comorbid conditions and history of renal insufficiency with a diagnosis of chronic anemia. She has been on Aranesp and have helped her overall quality of life and improvement in her performance status.  She had really reported some increase in her energy, increase in her appetite. She denies any recent falls but requires assistance from her husband for certain activities of daily living. She has not had any problems associated with Aranesp.  She had not had any injection or infusion problem and denied any toxicities. She has not noticed any bleeding or thrombosis. He reports some ecchymosis but clear cut bleeding.   Medications: I have reviewed the patient's current medications.  Current Outpatient Prescriptions  Medication Sig Dispense Refill  . ALPRAZolam (XANAX) 0.5 MG tablet Take 0.5 mg by mouth daily.      [name] amLODipine (NORVASC) 5 MG tablet Take 1 tablet (5 mg total) by mouth daily.  1 tablet  0  . aspirin 81 MG tablet Take 81 mg by mouth daily.       . carvedilol (COREG) 12.5 MG tablet TAKE 1 TABLET BY MOUTH TWICE A DAY  60 tablet  11  . cyanocobalamin 1000 MCG tablet Take 100 mcg by mouth daily.      . metFORMIN (GLUCOPHAGE) 1000 MG tablet Take 500 mg by mouth 2 (two) times daily with a meal.       . Multiple Vitamin (MULTIVITAMIN) capsule Take 1 capsule by mouth daily.      . niacin (NIASPAN) 1000 MG CR tablet Take 1,000 mg by mouth at bedtime.       [name] omeprazole (PRILOSEC) 20 MG  capsule Take 20 mg by mouth daily. As needed      . rosuvastatin (CRESTOR) 5 MG tablet Take 1 tablet (5 mg total) by mouth daily.  90 tablet  3  . timolol (BETIMOL) 0.5 % ophthalmic solution 1 drop 2 (two) times daily.      . [DISCONTINUED] metoprolol succinate (TOPROL-XL) 50 MG 24 hr tablet Take 50 mg by mouth 2 (two) times daily. Take with or immediately following a meal.       No current facility-administered medications for this visit.    Allergies: No Known Allergies  Past Medical History, Surgical history, Social history, and Family History were reviewed and updated.  Review of Systems: Constitutional:  Negative for fever, chills, night sweats, anorexia, weight loss, pain. Cardiovascular: no chest pain or dyspnea on exertion Respiratory: no cough, shortness of breath, or wheezing Neurological: no TIA or stroke symptoms Dermatological: negative ENT: negative Skin: Negative. Gastrointestinal: no abdominal pain, change in bowel habits, or black or bloody stools Genito-Urinary: no dysuria, trouble voiding, or hematuria Hematological and Lymphatic: negative Breast: negative Musculoskeletal: negative Remaining ROS negative.  Physical Exam: Blood pressure 133/51, pulse 63, temperature 97.6 F (36.4 C), temperature source Oral, resp. rate 18, height 5\' 8"  (1.727 m), weight 133 lb 12.8 oz (60.691 kg), SpO2 100.00%. ECOG: 1 General appearance: alert Head: Normocephalic, without obvious abnormality,  atraumatic Neck: no adenopathy, no carotid bruit, no JVD, supple, symmetrical, trachea midline and thyroid not enlarged, symmetric, no tenderness/mass/nodules Lymph nodes: Cervical, supraclavicular, and axillary nodes normal. Heart:regular rate and rhythm, S1, S2 normal, no murmur, click, rub or gallop Lung:chest clear, no wheezing, rales, normal symmetric air entry Abdomen: soft, non-tender, without masses or organomegaly EXT:no erythema, induration, or nodules  Lab Results: Lab  Results  Component Value Date   WBC 5.3 01/30/2013   HGB 10.1* 01/30/2013   HCT 29.5* 01/30/2013   MCV 100.4 01/30/2013   PLT 79* 01/30/2013   Impression and Plan:  A 71 year old female with the following issues:  1.     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  We will keep her on Aranesp, keep her hemoglobin above 11. Hemoglobin is 10.1. Aranesp will be given today. 2.     Thrombocytopenia.  Her platelet count is stable. No major bleeding at this time. However, we will continue to monitor her blood counts closely.  The likelihood of possibly developing myelodysplastic syndrome is always a possibility.   3.     Follow-up: CBC and Aranesp injection every 3 weeks. Visit in about 3 months.   Ksean Vale 6/18/201410:01 AM

## 2013-01-30 NOTE — Progress Notes (Signed)
Aranesp injection given during visit with MD.

## 2013-02-06 ENCOUNTER — Other Ambulatory Visit: Payer: Self-pay | Admitting: Oncology

## 2013-02-13 ENCOUNTER — Other Ambulatory Visit: Payer: Self-pay | Admitting: Cardiology

## 2013-02-20 ENCOUNTER — Ambulatory Visit (HOSPITAL_BASED_OUTPATIENT_CLINIC_OR_DEPARTMENT_OTHER): Payer: Medicare Other | Admitting: Oncology

## 2013-02-20 ENCOUNTER — Other Ambulatory Visit (HOSPITAL_BASED_OUTPATIENT_CLINIC_OR_DEPARTMENT_OTHER): Payer: Medicare Other | Admitting: Lab

## 2013-02-20 VITALS — BP 148/56 | HR 63 | Temp 98.0°F

## 2013-02-20 DIAGNOSIS — I5040 Unspecified combined systolic (congestive) and diastolic (congestive) heart failure: Secondary | ICD-10-CM

## 2013-02-20 DIAGNOSIS — I359 Nonrheumatic aortic valve disorder, unspecified: Secondary | ICD-10-CM

## 2013-02-20 DIAGNOSIS — E785 Hyperlipidemia, unspecified: Secondary | ICD-10-CM

## 2013-02-20 DIAGNOSIS — I10 Essential (primary) hypertension: Secondary | ICD-10-CM

## 2013-02-20 DIAGNOSIS — F411 Generalized anxiety disorder: Secondary | ICD-10-CM

## 2013-02-20 DIAGNOSIS — I2510 Atherosclerotic heart disease of native coronary artery without angina pectoris: Secondary | ICD-10-CM

## 2013-02-20 DIAGNOSIS — G4700 Insomnia, unspecified: Secondary | ICD-10-CM

## 2013-02-20 DIAGNOSIS — I739 Peripheral vascular disease, unspecified: Secondary | ICD-10-CM

## 2013-02-20 DIAGNOSIS — E118 Type 2 diabetes mellitus with unspecified complications: Secondary | ICD-10-CM

## 2013-02-20 DIAGNOSIS — K219 Gastro-esophageal reflux disease without esophagitis: Secondary | ICD-10-CM

## 2013-02-20 DIAGNOSIS — N189 Chronic kidney disease, unspecified: Secondary | ICD-10-CM

## 2013-02-20 DIAGNOSIS — I5032 Chronic diastolic (congestive) heart failure: Secondary | ICD-10-CM

## 2013-02-20 DIAGNOSIS — M545 Low back pain, unspecified: Secondary | ICD-10-CM

## 2013-02-20 DIAGNOSIS — D696 Thrombocytopenia, unspecified: Secondary | ICD-10-CM

## 2013-02-20 DIAGNOSIS — D649 Anemia, unspecified: Secondary | ICD-10-CM

## 2013-02-20 LAB — CBC WITH DIFFERENTIAL/PLATELET
BASO%: 0.7 % (ref 0.0–2.0)
Eosinophils Absolute: 0.3 10*3/uL (ref 0.0–0.5)
HCT: 32.1 % — ABNORMAL LOW (ref 34.8–46.6)
LYMPH%: 23.2 % (ref 14.0–49.7)
MCHC: 34 g/dL (ref 31.5–36.0)
MONO#: 0.5 10*3/uL (ref 0.1–0.9)
NEUT#: 2.4 10*3/uL (ref 1.5–6.5)
NEUT%: 56.6 % (ref 38.4–76.8)
Platelets: 68 10*3/uL — ABNORMAL LOW (ref 145–400)
RBC: 3.11 10*6/uL — ABNORMAL LOW (ref 3.70–5.45)
WBC: 4.3 10*3/uL (ref 3.9–10.3)
lymph#: 1 10*3/uL (ref 0.9–3.3)

## 2013-02-20 MED ORDER — DARBEPOETIN ALFA-POLYSORBATE 300 MCG/0.6ML IJ SOLN
300.0000 ug | Freq: Once | INTRAMUSCULAR | Status: AC
  Administered 2013-02-20: 300 ug via SUBCUTANEOUS
  Filled 2013-02-20: qty 0.6

## 2013-02-20 NOTE — Progress Notes (Signed)
Aranesp 300 mg given as ordered, per perimeters

## 2013-03-13 ENCOUNTER — Other Ambulatory Visit (HOSPITAL_BASED_OUTPATIENT_CLINIC_OR_DEPARTMENT_OTHER): Payer: Medicare Other | Admitting: Lab

## 2013-03-13 ENCOUNTER — Ambulatory Visit: Payer: Medicare Other

## 2013-03-13 DIAGNOSIS — N189 Chronic kidney disease, unspecified: Secondary | ICD-10-CM

## 2013-03-13 DIAGNOSIS — D649 Anemia, unspecified: Secondary | ICD-10-CM

## 2013-03-13 DIAGNOSIS — E118 Type 2 diabetes mellitus with unspecified complications: Secondary | ICD-10-CM

## 2013-03-13 DIAGNOSIS — F411 Generalized anxiety disorder: Secondary | ICD-10-CM

## 2013-03-13 DIAGNOSIS — M545 Low back pain, unspecified: Secondary | ICD-10-CM

## 2013-03-13 DIAGNOSIS — E785 Hyperlipidemia, unspecified: Secondary | ICD-10-CM

## 2013-03-13 DIAGNOSIS — I359 Nonrheumatic aortic valve disorder, unspecified: Secondary | ICD-10-CM

## 2013-03-13 DIAGNOSIS — D696 Thrombocytopenia, unspecified: Secondary | ICD-10-CM

## 2013-03-13 DIAGNOSIS — I5032 Chronic diastolic (congestive) heart failure: Secondary | ICD-10-CM

## 2013-03-13 DIAGNOSIS — I6529 Occlusion and stenosis of unspecified carotid artery: Secondary | ICD-10-CM

## 2013-03-13 DIAGNOSIS — I2510 Atherosclerotic heart disease of native coronary artery without angina pectoris: Secondary | ICD-10-CM

## 2013-03-13 DIAGNOSIS — I10 Essential (primary) hypertension: Secondary | ICD-10-CM

## 2013-03-13 DIAGNOSIS — G4700 Insomnia, unspecified: Secondary | ICD-10-CM

## 2013-03-13 DIAGNOSIS — I5040 Unspecified combined systolic (congestive) and diastolic (congestive) heart failure: Secondary | ICD-10-CM

## 2013-03-13 DIAGNOSIS — K219 Gastro-esophageal reflux disease without esophagitis: Secondary | ICD-10-CM

## 2013-03-13 DIAGNOSIS — I739 Peripheral vascular disease, unspecified: Secondary | ICD-10-CM

## 2013-03-13 LAB — CBC WITH DIFFERENTIAL/PLATELET
Basophils Absolute: 0 10*3/uL (ref 0.0–0.1)
HCT: 38 % (ref 34.8–46.6)
HGB: 12.2 g/dL (ref 11.6–15.9)
LYMPH%: 27.8 % (ref 14.0–49.7)
MONO#: 0.6 10*3/uL (ref 0.1–0.9)
NEUT%: 56.3 % (ref 38.4–76.8)
Platelets: 66 10*3/uL — ABNORMAL LOW (ref 145–400)
WBC: 5.4 10*3/uL (ref 3.9–10.3)
lymph#: 1.5 10*3/uL (ref 0.9–3.3)

## 2013-03-13 MED ORDER — DARBEPOETIN ALFA-POLYSORBATE 300 MCG/0.6ML IJ SOLN: 300.0000 ug | Freq: Once | INTRAMUSCULAR | Status: DC

## 2013-04-03 ENCOUNTER — Other Ambulatory Visit (HOSPITAL_BASED_OUTPATIENT_CLINIC_OR_DEPARTMENT_OTHER): Payer: Medicare Other | Admitting: Lab

## 2013-04-03 ENCOUNTER — Ambulatory Visit: Payer: Medicare Other

## 2013-04-03 DIAGNOSIS — I5040 Unspecified combined systolic (congestive) and diastolic (congestive) heart failure: Secondary | ICD-10-CM

## 2013-04-03 DIAGNOSIS — G4700 Insomnia, unspecified: Secondary | ICD-10-CM

## 2013-04-03 DIAGNOSIS — I10 Essential (primary) hypertension: Secondary | ICD-10-CM

## 2013-04-03 DIAGNOSIS — I5032 Chronic diastolic (congestive) heart failure: Secondary | ICD-10-CM

## 2013-04-03 DIAGNOSIS — K219 Gastro-esophageal reflux disease without esophagitis: Secondary | ICD-10-CM

## 2013-04-03 DIAGNOSIS — I739 Peripheral vascular disease, unspecified: Secondary | ICD-10-CM

## 2013-04-03 DIAGNOSIS — I359 Nonrheumatic aortic valve disorder, unspecified: Secondary | ICD-10-CM

## 2013-04-03 DIAGNOSIS — E785 Hyperlipidemia, unspecified: Secondary | ICD-10-CM

## 2013-04-03 DIAGNOSIS — I2510 Atherosclerotic heart disease of native coronary artery without angina pectoris: Secondary | ICD-10-CM

## 2013-04-03 DIAGNOSIS — E118 Type 2 diabetes mellitus with unspecified complications: Secondary | ICD-10-CM

## 2013-04-03 DIAGNOSIS — M545 Low back pain, unspecified: Secondary | ICD-10-CM

## 2013-04-03 DIAGNOSIS — D649 Anemia, unspecified: Secondary | ICD-10-CM

## 2013-04-03 DIAGNOSIS — F411 Generalized anxiety disorder: Secondary | ICD-10-CM

## 2013-04-03 DIAGNOSIS — D696 Thrombocytopenia, unspecified: Secondary | ICD-10-CM

## 2013-04-03 DIAGNOSIS — N189 Chronic kidney disease, unspecified: Secondary | ICD-10-CM

## 2013-04-03 LAB — CBC WITH DIFFERENTIAL/PLATELET
BASO%: 0.6 % (ref 0.0–2.0)
EOS%: 3.3 % (ref 0.0–7.0)
HCT: 32.3 % — ABNORMAL LOW (ref 34.8–46.6)
LYMPH%: 22.3 % (ref 14.0–49.7)
MCH: 34.6 pg — ABNORMAL HIGH (ref 25.1–34.0)
MCHC: 33.9 g/dL (ref 31.5–36.0)
MCV: 101.9 fL — ABNORMAL HIGH (ref 79.5–101.0)
NEUT%: 62.8 % (ref 38.4–76.8)
Platelets: 76 10*3/uL — ABNORMAL LOW (ref 145–400)

## 2013-04-03 MED ORDER — DARBEPOETIN ALFA-POLYSORBATE 300 MCG/0.6ML IJ SOLN: 300.0000 ug | Freq: Once | INTRAMUSCULAR | Status: DC

## 2013-04-19 ENCOUNTER — Telehealth: Payer: Self-pay | Admitting: Oncology

## 2013-04-19 NOTE — Telephone Encounter (Signed)
MOVED 9/10 APPT TO 9/8 DUE TO [facility] OUT . S/W PT SHE IS AWARE.

## 2013-04-22 ENCOUNTER — Ambulatory Visit (HOSPITAL_BASED_OUTPATIENT_CLINIC_OR_DEPARTMENT_OTHER): Payer: Medicare Other | Admitting: Oncology

## 2013-04-22 ENCOUNTER — Telehealth: Payer: Self-pay | Admitting: Oncology

## 2013-04-22 ENCOUNTER — Ambulatory Visit (HOSPITAL_BASED_OUTPATIENT_CLINIC_OR_DEPARTMENT_OTHER): Payer: Medicare Other

## 2013-04-22 ENCOUNTER — Other Ambulatory Visit (HOSPITAL_BASED_OUTPATIENT_CLINIC_OR_DEPARTMENT_OTHER): Payer: Medicare Other | Admitting: Lab

## 2013-04-22 ENCOUNTER — Encounter: Payer: Self-pay | Admitting: Oncology

## 2013-04-22 VITALS — BP 148/63 | HR 64 | Temp 97.6°F | Resp 18 | Ht 68.0 in | Wt 135.9 lb

## 2013-04-22 DIAGNOSIS — I2510 Atherosclerotic heart disease of native coronary artery without angina pectoris: Secondary | ICD-10-CM

## 2013-04-22 DIAGNOSIS — I739 Peripheral vascular disease, unspecified: Secondary | ICD-10-CM

## 2013-04-22 DIAGNOSIS — N289 Disorder of kidney and ureter, unspecified: Secondary | ICD-10-CM

## 2013-04-22 DIAGNOSIS — I359 Nonrheumatic aortic valve disorder, unspecified: Secondary | ICD-10-CM

## 2013-04-22 DIAGNOSIS — I5032 Chronic diastolic (congestive) heart failure: Secondary | ICD-10-CM

## 2013-04-22 DIAGNOSIS — G4700 Insomnia, unspecified: Secondary | ICD-10-CM

## 2013-04-22 DIAGNOSIS — D638 Anemia in other chronic diseases classified elsewhere: Secondary | ICD-10-CM

## 2013-04-22 DIAGNOSIS — M545 Low back pain, unspecified: Secondary | ICD-10-CM

## 2013-04-22 DIAGNOSIS — D649 Anemia, unspecified: Secondary | ICD-10-CM

## 2013-04-22 DIAGNOSIS — D696 Thrombocytopenia, unspecified: Secondary | ICD-10-CM

## 2013-04-22 DIAGNOSIS — F411 Generalized anxiety disorder: Secondary | ICD-10-CM

## 2013-04-22 DIAGNOSIS — E785 Hyperlipidemia, unspecified: Secondary | ICD-10-CM

## 2013-04-22 DIAGNOSIS — I6529 Occlusion and stenosis of unspecified carotid artery: Secondary | ICD-10-CM

## 2013-04-22 DIAGNOSIS — K219 Gastro-esophageal reflux disease without esophagitis: Secondary | ICD-10-CM

## 2013-04-22 DIAGNOSIS — I10 Essential (primary) hypertension: Secondary | ICD-10-CM

## 2013-04-22 DIAGNOSIS — I5040 Unspecified combined systolic (congestive) and diastolic (congestive) heart failure: Secondary | ICD-10-CM

## 2013-04-22 DIAGNOSIS — N189 Chronic kidney disease, unspecified: Secondary | ICD-10-CM

## 2013-04-22 DIAGNOSIS — E118 Type 2 diabetes mellitus with unspecified complications: Secondary | ICD-10-CM

## 2013-04-22 LAB — CBC WITH DIFFERENTIAL/PLATELET
BASO%: 0.4 % (ref 0.0–2.0)
EOS%: 4.2 % (ref 0.0–7.0)
MCH: 34.7 pg — ABNORMAL HIGH (ref 25.1–34.0)
MCHC: 34.1 g/dL (ref 31.5–36.0)
MONO%: 12.1 % (ref 0.0–14.0)
NEUT%: 59 % (ref 38.4–76.8)
RDW: 13.5 % (ref 11.2–14.5)
lymph#: 1.1 10*3/uL (ref 0.9–3.3)

## 2013-04-22 MED ORDER — DARBEPOETIN ALFA-POLYSORBATE 300 MCG/0.6ML IJ SOLN
300.0000 ug | Freq: Once | INTRAMUSCULAR | Status: AC
  Administered 2013-04-22: 300 ug via SUBCUTANEOUS
  Filled 2013-04-22: qty 0.6

## 2013-04-22 NOTE — Telephone Encounter (Signed)
gv and printed appt sched and avs for pt....pt requ3ested wednesdays.Marland KitchenMarland KitchenMarland Kitchen

## 2013-04-22 NOTE — Progress Notes (Signed)
Hematology and Oncology Follow Up Visit  [name] [id number] May 23, 1941 72 y.o. 04/22/2013 11:09 AM [name], MDWilson, Jeanne Flavors, [name]   Principle Diagnosis: This is a 72 year old female with multifactorial anemia. She has anemia of chronic disease, anemia of renal disease, diagnosed in 2011.  Current therapy: Aranesp 300 mcg subcutaneously every 3 weeks keeping hemoglobin above 11.  Interim History:  Ms. Bonvillain presents today for a followup visit with her husband. This is a very pleasant woman with a few comorbid conditions and history of renal insufficiency with a diagnosis of chronic anemia. She has been on Aranesp and have helped her overall quality of life and improvement in her performance status. She had really reported some increase in her energy, increase in her appetite. She denies any recent falls but requires assistance from her husband for certain activities of daily living. She has not had any problems associated with Aranesp. She had not had any injection or infusion problem and denied any toxicities. She has not noticed any bleeding or thrombosis. He reports some ecchymosis but clear cut bleeding.   Medications: I have reviewed the patient's current medications.  Current Outpatient Prescriptions  Medication Sig Dispense Refill  . ALPRAZolam (XANAX) 0.5 MG tablet Take 0.5 mg by mouth daily.      [name] amLODipine (NORVASC) 5 MG tablet Take 1 tablet (5 mg total) by mouth daily.  1 tablet  0  . aspirin 81 MG tablet Take 81 mg by mouth daily.       . carvedilol (COREG) 12.5 MG tablet TAKE 1 TABLET BY MOUTH TWICE A DAY  60 tablet  11  . CRESTOR 5 MG tablet TAKE 1 TABLET DAILY  90 tablet  3  . cyanocobalamin 1000 MCG tablet Take 100 mcg by mouth daily.      . metFORMIN (GLUCOPHAGE) 1000 MG tablet Take 500 mg by mouth 2 (two) times daily with a meal.       . Multiple Vitamin (MULTIVITAMIN) capsule Take 1 capsule by mouth daily.      . niacin (NIASPAN) 1000 MG CR tablet Take 1,000 mg  by mouth at bedtime.       [name] omeprazole (PRILOSEC) 20 MG capsule Take 20 mg by mouth daily. As needed      . timolol (BETIMOL) 0.5 % ophthalmic solution 1 drop 2 (two) times daily.      . [DISCONTINUED] metoprolol succinate (TOPROL-XL) 50 MG 24 hr tablet Take 50 mg by mouth 2 (two) times daily. Take with or immediately following a meal.       No current facility-administered medications for this visit.     Allergies: No Known Allergies  Past Medical History, Surgical history, Social history, and Family History were reviewed and updated.  Review of Systems: Constitutional:  Negative for fever, chills, night sweats, anorexia, weight loss, pain. Cardiovascular: no chest pain or dyspnea on exertion Respiratory: no cough, shortness of breath, or wheezing Neurological: no TIA or stroke symptoms Dermatological: negative ENT: negative Skin: Negative. Gastrointestinal: no abdominal pain, change in bowel habits, or black or bloody stools Genito-Urinary: no dysuria, trouble voiding, or hematuria Hematological and Lymphatic: negative Breast: negative for breast lumps Musculoskeletal: negative Remaining ROS negative.  Physical Exam: Blood pressure 148/63, pulse 64, temperature 97.6 F (36.4 C), temperature source Oral, resp. rate 18, height 5\' 8"  (1.727 m), weight 135 lb 14.4 oz (61.644 kg), SpO2 100.00%. ECOG: 1 General appearance: alert, cooperative and no distress Head: Normocephalic, without obvious abnormality, atraumatic Neck: no  adenopathy, no carotid bruit, no JVD, supple, symmetrical, trachea midline and thyroid not enlarged, symmetric, no tenderness/mass/nodules Lymph nodes: Cervical, supraclavicular, and axillary nodes normal. Heart:regular rate and rhythm, S1, S2 normal, no murmur, click, rub or gallop Lung:chest clear, no wheezing, rales, normal symmetric air entry, no tachypnea, retractions or cyanosis Abdomen: soft, non-tender, without masses or organomegaly EXT:no erythema,  induration, or nodules   Lab Results: Lab Results  Component Value Date   WBC 4.7 04/22/2013   HGB 9.7* 04/22/2013   HCT 28.5* 04/22/2013   MCV 101.7* 04/22/2013   PLT 73* 04/22/2013     Chemistry      Component Value Date/Time   NA 135* 08/16/2012 0757   NA 134* 12/26/2011 0933   K 4.6 08/16/2012 0757   K 4.4 12/26/2011 0933   CL 101 08/16/2012 0757   CL 101 12/26/2011 0933   CO2 21* 08/16/2012 0757   CO2 20 12/26/2011 0933   BUN 25.0 08/16/2012 0757   BUN 27* 12/26/2011 0933   CREATININE 2.1* 08/16/2012 0757   CREATININE 1.9* 12/26/2011 0933      Component Value Date/Time   CALCIUM 9.2 08/16/2012 0757   CALCIUM 8.8 12/26/2011 0933   ALKPHOS 91 08/16/2012 0757   ALKPHOS 75 12/26/2011 0933   AST 21 08/16/2012 0757   AST 23 12/26/2011 0933   ALT 10 08/16/2012 0757   ALT 14 12/26/2011 0933   BILITOT 0.50 08/16/2012 0757   BILITOT 0.6 12/26/2011 0933     Impression and Plan:  A 72 year old female with the following issues:  1. 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 We will keep her on Aranesp, keep her hemoglobin above 11. Hemoglobin is 9.7. Aranesp will be given today.  2. Thrombocytopenia. Her platelet count is stable. No major bleeding at this time. However, we will continue to monitor her blood counts closely. The likelihood of possibly developing myelodysplastic syndrome is always a possibility.  3. Follow-up: CBC and Aranesp injection every 3 weeks. Visit in about 3 months.   Spent more than half the time coordinating care.    [place], [place] 9/8/201411:09 AM

## 2013-04-24 ENCOUNTER — Ambulatory Visit: Payer: Medicare Other

## 2013-04-24 ENCOUNTER — Ambulatory Visit: Payer: Medicare Other | Admitting: Oncology

## 2013-04-24 ENCOUNTER — Other Ambulatory Visit: Payer: Medicare Other | Admitting: Lab

## 2013-05-15 ENCOUNTER — Other Ambulatory Visit (HOSPITAL_BASED_OUTPATIENT_CLINIC_OR_DEPARTMENT_OTHER): Payer: Medicare Other

## 2013-05-15 ENCOUNTER — Ambulatory Visit (HOSPITAL_BASED_OUTPATIENT_CLINIC_OR_DEPARTMENT_OTHER): Payer: Medicare Other

## 2013-05-15 VITALS — BP 148/52 | HR 62 | Temp 97.5°F

## 2013-05-15 DIAGNOSIS — D638 Anemia in other chronic diseases classified elsewhere: Secondary | ICD-10-CM

## 2013-05-15 DIAGNOSIS — D696 Thrombocytopenia, unspecified: Secondary | ICD-10-CM

## 2013-05-15 DIAGNOSIS — D649 Anemia, unspecified: Secondary | ICD-10-CM

## 2013-05-15 DIAGNOSIS — N289 Disorder of kidney and ureter, unspecified: Secondary | ICD-10-CM

## 2013-05-15 DIAGNOSIS — N189 Chronic kidney disease, unspecified: Secondary | ICD-10-CM

## 2013-05-15 LAB — CBC WITH DIFFERENTIAL/PLATELET
BASO%: 0.8 % (ref 0.0–2.0)
Basophils Absolute: 0 10*3/uL (ref 0.0–0.1)
EOS%: 3.3 % (ref 0.0–7.0)
HGB: 10.5 g/dL — ABNORMAL LOW (ref 11.6–15.9)
MCH: 35.5 pg — ABNORMAL HIGH (ref 25.1–34.0)
RDW: 15.8 % — ABNORMAL HIGH (ref 11.2–14.5)
lymph#: 0.9 10*3/uL (ref 0.9–3.3)

## 2013-05-15 MED ORDER — DARBEPOETIN ALFA-POLYSORBATE 300 MCG/0.6ML IJ SOLN
300.0000 ug | Freq: Once | INTRAMUSCULAR | Status: AC
  Administered 2013-05-15: 300 ug via SUBCUTANEOUS
  Filled 2013-05-15: qty 0.6

## 2013-06-05 ENCOUNTER — Ambulatory Visit: Payer: Medicare Other

## 2013-06-05 ENCOUNTER — Other Ambulatory Visit (HOSPITAL_BASED_OUTPATIENT_CLINIC_OR_DEPARTMENT_OTHER): Payer: Medicare Other | Admitting: Lab

## 2013-06-05 DIAGNOSIS — N189 Chronic kidney disease, unspecified: Secondary | ICD-10-CM

## 2013-06-05 DIAGNOSIS — D649 Anemia, unspecified: Secondary | ICD-10-CM

## 2013-06-05 DIAGNOSIS — D696 Thrombocytopenia, unspecified: Secondary | ICD-10-CM

## 2013-06-05 LAB — CBC WITH DIFFERENTIAL/PLATELET
Basophils Absolute: 0 10*3/uL (ref 0.0–0.1)
Eosinophils Absolute: 0.2 10*3/uL (ref 0.0–0.5)
HGB: 12 g/dL (ref 11.6–15.9)
MCV: 105.7 fL — ABNORMAL HIGH (ref 79.5–101.0)
MONO#: 0.7 10*3/uL (ref 0.1–0.9)
NEUT#: 3 10*3/uL (ref 1.5–6.5)
RDW: 14.6 % — ABNORMAL HIGH (ref 11.2–14.5)
WBC: 5 10*3/uL (ref 3.9–10.3)
lymph#: 1.1 10*3/uL (ref 0.9–3.3)

## 2013-06-05 MED ORDER — DARBEPOETIN ALFA-POLYSORBATE 300 MCG/0.6ML IJ SOLN: 300.0000 ug | Freq: Once | INTRAMUSCULAR | Status: DC

## 2013-06-26 ENCOUNTER — Ambulatory Visit (HOSPITAL_BASED_OUTPATIENT_CLINIC_OR_DEPARTMENT_OTHER): Payer: Medicare Other

## 2013-06-26 ENCOUNTER — Other Ambulatory Visit (HOSPITAL_BASED_OUTPATIENT_CLINIC_OR_DEPARTMENT_OTHER): Payer: Medicare Other | Admitting: Lab

## 2013-06-26 ENCOUNTER — Encounter (INDEPENDENT_AMBULATORY_CARE_PROVIDER_SITE_OTHER): Payer: Self-pay

## 2013-06-26 VITALS — BP 132/51 | HR 62 | Temp 97.3°F

## 2013-06-26 DIAGNOSIS — E785 Hyperlipidemia, unspecified: Secondary | ICD-10-CM

## 2013-06-26 DIAGNOSIS — N289 Disorder of kidney and ureter, unspecified: Secondary | ICD-10-CM

## 2013-06-26 DIAGNOSIS — D638 Anemia in other chronic diseases classified elsewhere: Secondary | ICD-10-CM

## 2013-06-26 DIAGNOSIS — N189 Chronic kidney disease, unspecified: Secondary | ICD-10-CM

## 2013-06-26 DIAGNOSIS — M545 Low back pain, unspecified: Secondary | ICD-10-CM

## 2013-06-26 DIAGNOSIS — I5040 Unspecified combined systolic (congestive) and diastolic (congestive) heart failure: Secondary | ICD-10-CM

## 2013-06-26 DIAGNOSIS — I2510 Atherosclerotic heart disease of native coronary artery without angina pectoris: Secondary | ICD-10-CM

## 2013-06-26 DIAGNOSIS — D649 Anemia, unspecified: Secondary | ICD-10-CM

## 2013-06-26 DIAGNOSIS — D696 Thrombocytopenia, unspecified: Secondary | ICD-10-CM

## 2013-06-26 DIAGNOSIS — I10 Essential (primary) hypertension: Secondary | ICD-10-CM

## 2013-06-26 DIAGNOSIS — I739 Peripheral vascular disease, unspecified: Secondary | ICD-10-CM

## 2013-06-26 DIAGNOSIS — G4700 Insomnia, unspecified: Secondary | ICD-10-CM

## 2013-06-26 DIAGNOSIS — I5032 Chronic diastolic (congestive) heart failure: Secondary | ICD-10-CM

## 2013-06-26 DIAGNOSIS — K219 Gastro-esophageal reflux disease without esophagitis: Secondary | ICD-10-CM

## 2013-06-26 DIAGNOSIS — E118 Type 2 diabetes mellitus with unspecified complications: Secondary | ICD-10-CM

## 2013-06-26 DIAGNOSIS — I359 Nonrheumatic aortic valve disorder, unspecified: Secondary | ICD-10-CM

## 2013-06-26 DIAGNOSIS — F411 Generalized anxiety disorder: Secondary | ICD-10-CM

## 2013-06-26 LAB — CBC WITH DIFFERENTIAL/PLATELET
Basophils Absolute: 0 10*3/uL (ref 0.0–0.1)
Eosinophils Absolute: 0.2 10*3/uL (ref 0.0–0.5)
HCT: 32 % — ABNORMAL LOW (ref 34.8–46.6)
HGB: 10.5 g/dL — ABNORMAL LOW (ref 11.6–15.9)
LYMPH%: 25.3 % (ref 14.0–49.7)
MCV: 104.4 fL — ABNORMAL HIGH (ref 79.5–101.0)
MONO%: 12.8 % (ref 0.0–14.0)
NEUT#: 3 10*3/uL (ref 1.5–6.5)
Platelets: 77 10*3/uL — ABNORMAL LOW (ref 145–400)

## 2013-06-26 MED ORDER — DARBEPOETIN ALFA-POLYSORBATE 300 MCG/0.6ML IJ SOLN
300.0000 ug | Freq: Once | INTRAMUSCULAR | Status: AC
  Administered 2013-06-26: 300 ug via SUBCUTANEOUS
  Filled 2013-06-26: qty 0.6

## 2013-07-17 ENCOUNTER — Other Ambulatory Visit (HOSPITAL_BASED_OUTPATIENT_CLINIC_OR_DEPARTMENT_OTHER): Payer: Medicare Other | Admitting: Lab

## 2013-07-17 ENCOUNTER — Ambulatory Visit: Payer: Medicare Other

## 2013-07-17 ENCOUNTER — Telehealth: Payer: Self-pay | Admitting: Oncology

## 2013-07-17 ENCOUNTER — Ambulatory Visit (HOSPITAL_BASED_OUTPATIENT_CLINIC_OR_DEPARTMENT_OTHER): Payer: Medicare Other | Admitting: Oncology

## 2013-07-17 VITALS — BP 136/52 | HR 67 | Temp 97.2°F | Resp 20 | Wt 139.5 lb

## 2013-07-17 DIAGNOSIS — D638 Anemia in other chronic diseases classified elsewhere: Secondary | ICD-10-CM

## 2013-07-17 DIAGNOSIS — D696 Thrombocytopenia, unspecified: Secondary | ICD-10-CM

## 2013-07-17 DIAGNOSIS — D631 Anemia in chronic kidney disease: Secondary | ICD-10-CM

## 2013-07-17 DIAGNOSIS — D649 Anemia, unspecified: Secondary | ICD-10-CM

## 2013-07-17 DIAGNOSIS — N189 Chronic kidney disease, unspecified: Secondary | ICD-10-CM

## 2013-07-17 LAB — CBC WITH DIFFERENTIAL/PLATELET
Basophils Absolute: 0 10*3/uL (ref 0.0–0.1)
Eosinophils Absolute: 0.2 10*3/uL (ref 0.0–0.5)
HGB: 11.5 g/dL — ABNORMAL LOW (ref 11.6–15.9)
LYMPH%: 24.3 % (ref 14.0–49.7)
MCV: 110 fL — ABNORMAL HIGH (ref 79.5–101.0)
MONO%: 14.6 % — ABNORMAL HIGH (ref 0.0–14.0)
NEUT#: 2.9 10*3/uL (ref 1.5–6.5)
NEUT%: 55.8 % (ref 38.4–76.8)
Platelets: 72 10*3/uL — ABNORMAL LOW (ref 145–400)
RDW: 13.8 % (ref 11.2–14.5)

## 2013-07-17 NOTE — Telephone Encounter (Signed)
appts made per 12/3 POF AVS and CAL given shh

## 2013-07-17 NOTE — Progress Notes (Signed)
Patient in for appointment with Dr. Alen Blew. Patient did not get Aranesp injection today. HGB is 11.5.

## 2013-07-17 NOTE — Progress Notes (Signed)
Hematology and Oncology Follow Up Visit  HIMA LINNEY [id number] Aug 12, 1941 72 y.o. 07/17/2013 9:02 AM [name], MDWilson, [name], MD   Principle Diagnosis: This is a 72 year old female with multifactorial anemia. She has anemia of chronic disease, anemia of renal disease, diagnosed in 2011.  Current therapy: Aranesp 300 mcg subcutaneously every 3 weeks keeping hemoglobin above 11.  Interim History:  Ms. Wolin presents today for a followup visit with her husband. This is a very pleasant woman with a few comorbid conditions and history of renal insufficiency with a diagnosis of chronic anemia. She has been on Aranesp and have helped her overall quality of life and improvement in her performance status. She had really reported some increase in her energy, increase in her appetite. She denies any recent falls but requires assistance from her husband for certain activities of daily living. She has not had any problems associated with Aranesp. She had not had any injection or infusion problem and denied any toxicities. She has not noticed any bleeding or thrombosis. She reports no recent illnesses or hospitalizations. She has not reported any headaches or blurry vision or double vision. She does not report any chest pain shortness of breath or difficulty breathing. She does not report any nausea or vomiting or abdominal pain. She does not report any genitourinary complaints.  Medications: I have reviewed the patient's current medications.  Current Outpatient Prescriptions  Medication Sig Dispense Refill  . ALPRAZolam (XANAX) 0.5 MG tablet Take 0.5 mg by mouth daily.      [name] amLODipine (NORVASC) 5 MG tablet Take 1 tablet (5 mg total) by mouth daily.  1 tablet  0  . aspirin 81 MG tablet Take 81 mg by mouth daily.       . carvedilol (COREG) 12.5 MG tablet TAKE 1 TABLET BY MOUTH TWICE A DAY  60 tablet  11  . CRESTOR 5 MG tablet TAKE 1 TABLET DAILY  90 tablet  3  . cyanocobalamin 1000 MCG tablet  Take 100 mcg by mouth daily.      . metFORMIN (GLUCOPHAGE) 1000 MG tablet Take 500 mg by mouth 2 (two) times daily with a meal.       . Multiple Vitamin (MULTIVITAMIN) capsule Take 1 capsule by mouth daily.      . niacin (NIASPAN) 1000 MG CR tablet Take 1,000 mg by mouth at bedtime.       [name] omeprazole (PRILOSEC) 20 MG capsule Take 20 mg by mouth daily. As needed      . timolol (BETIMOL) 0.5 % ophthalmic solution 1 drop 2 (two) times daily.      . [DISCONTINUED] metoprolol succinate (TOPROL-XL) 50 MG 24 hr tablet Take 50 mg by mouth 2 (two) times daily. Take with or immediately following a meal.       No current facility-administered medications for this visit.     Allergies: No Known Allergies  Past Medical History, Surgical history, Social history, and Family History were reviewed and updated.   Remaining ROS negative.  Physical Exam: Blood pressure 136/52, pulse 67, temperature 97.2 F (36.2 C), temperature source Oral, resp. rate 20, weight 139 lb 8 oz (63.277 kg). ECOG: 1 General appearance: alert, cooperative and no distress Head: Normocephalic, without obvious abnormality, atraumatic Neck: no adenopathy, no carotid bruit, no JVD, supple, symmetrical, trachea midline and thyroid not enlarged, symmetric, no tenderness/mass/nodules Lymph nodes: Cervical, supraclavicular, and axillary nodes normal. Heart:regular rate and rhythm, S1, S2 normal, no murmur, click, rub or gallop Lung:chest  clear, no wheezing, rales, normal symmetric air entry, no tachypnea, retractions or cyanosis Abdomen: soft, non-tender, without masses or organomegaly EXT:no erythema, induration, or nodules   Lab Results: Lab Results  Component Value Date   WBC 5.1 07/17/2013   HGB 11.5* 07/17/2013   HCT 35.9 07/17/2013   MCV 110.0* 07/17/2013   PLT 72* 07/17/2013     Chemistry      Component Value Date/Time   NA 135* 08/16/2012 0757   NA 134* 12/26/2011 0933   K 4.6 08/16/2012 0757   K 4.4 12/26/2011 0933   CL  101 08/16/2012 0757   CL 101 12/26/2011 0933   CO2 21* 08/16/2012 0757   CO2 20 12/26/2011 0933   BUN 25.0 08/16/2012 0757   BUN 27* 12/26/2011 0933   CREATININE 2.1* 08/16/2012 0757   CREATININE 1.9* 12/26/2011 0933      Component Value Date/Time   CALCIUM 9.2 08/16/2012 0757   CALCIUM 8.8 12/26/2011 0933   ALKPHOS 91 08/16/2012 0757   ALKPHOS 75 12/26/2011 0933   AST 21 08/16/2012 0757   AST 23 12/26/2011 0933   ALT 10 08/16/2012 0757   ALT 14 12/26/2011 0933   BILITOT 0.50 08/16/2012 0757   BILITOT 0.6 12/26/2011 0933     Impression and Plan:  A 72 year old female with the following issues:  1. 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 We will keep her on Aranesp, keep her hemoglobin above 11. Hemoglobin is 11.5. Aranesp will not be given today.  2. Thrombocytopenia. Her platelet count is stable. No major bleeding at this time. However, we will continue to monitor her blood counts closely. The likelihood of possibly developing myelodysplastic syndrome is always a possibility.   3. Follow-up: CBC and Aranesp injection every 3 weeks. Visit in about 3 months.      Sameria Morss 12/3/20149:02 AM

## 2013-08-07 ENCOUNTER — Other Ambulatory Visit (HOSPITAL_BASED_OUTPATIENT_CLINIC_OR_DEPARTMENT_OTHER): Payer: Medicare Other

## 2013-08-07 ENCOUNTER — Ambulatory Visit (HOSPITAL_BASED_OUTPATIENT_CLINIC_OR_DEPARTMENT_OTHER): Payer: Medicare Other

## 2013-08-07 VITALS — BP 124/59 | HR 60 | Temp 97.5°F

## 2013-08-07 DIAGNOSIS — D696 Thrombocytopenia, unspecified: Secondary | ICD-10-CM

## 2013-08-07 DIAGNOSIS — D649 Anemia, unspecified: Secondary | ICD-10-CM

## 2013-08-07 DIAGNOSIS — D638 Anemia in other chronic diseases classified elsewhere: Secondary | ICD-10-CM

## 2013-08-07 DIAGNOSIS — N189 Chronic kidney disease, unspecified: Secondary | ICD-10-CM

## 2013-08-07 DIAGNOSIS — N289 Disorder of kidney and ureter, unspecified: Secondary | ICD-10-CM

## 2013-08-07 LAB — CBC WITH DIFFERENTIAL/PLATELET
BASO%: 0.5 % (ref 0.0–2.0)
Eosinophils Absolute: 0.4 10*3/uL (ref 0.0–0.5)
HCT: 32 % — ABNORMAL LOW (ref 34.8–46.6)
LYMPH%: 22.7 % (ref 14.0–49.7)
MCH: 33.9 pg (ref 25.1–34.0)
MCV: 105.4 fL — ABNORMAL HIGH (ref 79.5–101.0)
MONO%: 11.8 % (ref 0.0–14.0)
NEUT#: 3.4 10*3/uL (ref 1.5–6.5)
NEUT%: 58.3 % (ref 38.4–76.8)
Platelets: 72 10*3/uL — ABNORMAL LOW (ref 145–400)
RBC: 3.04 10*6/uL — ABNORMAL LOW (ref 3.70–5.45)
RDW: 12.6 % (ref 11.2–14.5)

## 2013-08-07 MED ORDER — DARBEPOETIN ALFA-POLYSORBATE 300 MCG/0.6ML IJ SOLN
300.0000 ug | Freq: Once | INTRAMUSCULAR | Status: AC
  Administered 2013-08-07: 300 ug via SUBCUTANEOUS
  Filled 2013-08-07: qty 0.6

## 2013-08-16 ENCOUNTER — Other Ambulatory Visit: Payer: Self-pay | Admitting: Cardiology

## 2013-08-28 ENCOUNTER — Ambulatory Visit: Payer: Medicare HMO

## 2013-08-28 ENCOUNTER — Other Ambulatory Visit (HOSPITAL_BASED_OUTPATIENT_CLINIC_OR_DEPARTMENT_OTHER): Payer: Medicare HMO

## 2013-08-28 DIAGNOSIS — D696 Thrombocytopenia, unspecified: Secondary | ICD-10-CM

## 2013-08-28 DIAGNOSIS — M545 Low back pain, unspecified: Secondary | ICD-10-CM

## 2013-08-28 DIAGNOSIS — G4700 Insomnia, unspecified: Secondary | ICD-10-CM

## 2013-08-28 DIAGNOSIS — I5032 Chronic diastolic (congestive) heart failure: Secondary | ICD-10-CM

## 2013-08-28 DIAGNOSIS — I739 Peripheral vascular disease, unspecified: Secondary | ICD-10-CM

## 2013-08-28 DIAGNOSIS — N039 Chronic nephritic syndrome with unspecified morphologic changes: Secondary | ICD-10-CM

## 2013-08-28 DIAGNOSIS — F411 Generalized anxiety disorder: Secondary | ICD-10-CM

## 2013-08-28 DIAGNOSIS — D649 Anemia, unspecified: Secondary | ICD-10-CM

## 2013-08-28 DIAGNOSIS — D638 Anemia in other chronic diseases classified elsewhere: Secondary | ICD-10-CM

## 2013-08-28 DIAGNOSIS — N189 Chronic kidney disease, unspecified: Secondary | ICD-10-CM

## 2013-08-28 DIAGNOSIS — D631 Anemia in chronic kidney disease: Secondary | ICD-10-CM

## 2013-08-28 DIAGNOSIS — E118 Type 2 diabetes mellitus with unspecified complications: Secondary | ICD-10-CM

## 2013-08-28 DIAGNOSIS — K219 Gastro-esophageal reflux disease without esophagitis: Secondary | ICD-10-CM

## 2013-08-28 DIAGNOSIS — I5040 Unspecified combined systolic (congestive) and diastolic (congestive) heart failure: Secondary | ICD-10-CM

## 2013-08-28 DIAGNOSIS — E785 Hyperlipidemia, unspecified: Secondary | ICD-10-CM

## 2013-08-28 DIAGNOSIS — I6529 Occlusion and stenosis of unspecified carotid artery: Secondary | ICD-10-CM

## 2013-08-28 DIAGNOSIS — I2510 Atherosclerotic heart disease of native coronary artery without angina pectoris: Secondary | ICD-10-CM

## 2013-08-28 DIAGNOSIS — I359 Nonrheumatic aortic valve disorder, unspecified: Secondary | ICD-10-CM

## 2013-08-28 DIAGNOSIS — I10 Essential (primary) hypertension: Secondary | ICD-10-CM

## 2013-08-28 LAB — CBC WITH DIFFERENTIAL/PLATELET
BASO%: 0.6 % (ref 0.0–2.0)
Basophils Absolute: 0 10*3/uL (ref 0.0–0.1)
EOS%: 6.2 % (ref 0.0–7.0)
Eosinophils Absolute: 0.3 10*3/uL (ref 0.0–0.5)
HCT: 35.7 % (ref 34.8–46.6)
HGB: 12 g/dL (ref 11.6–15.9)
LYMPH#: 1.2 10*3/uL (ref 0.9–3.3)
LYMPH%: 24.4 % (ref 14.0–49.7)
MCH: 36.2 pg — AB (ref 25.1–34.0)
MCHC: 33.5 g/dL (ref 31.5–36.0)
MCV: 108.3 fL — ABNORMAL HIGH (ref 79.5–101.0)
MONO#: 0.7 10*3/uL (ref 0.1–0.9)
MONO%: 14.7 % — ABNORMAL HIGH (ref 0.0–14.0)
NEUT#: 2.6 10*3/uL (ref 1.5–6.5)
NEUT%: 54.1 % (ref 38.4–76.8)
Platelets: 75 10*3/uL — ABNORMAL LOW (ref 145–400)
RBC: 3.3 10*6/uL — AB (ref 3.70–5.45)
RDW: 14.8 % — AB (ref 11.2–14.5)
WBC: 4.8 10*3/uL (ref 3.9–10.3)

## 2013-08-28 MED ORDER — DARBEPOETIN ALFA-POLYSORBATE 300 MCG/0.6ML IJ SOLN: 300.0000 ug | Freq: Once | INTRAMUSCULAR | Status: DC

## 2013-09-18 ENCOUNTER — Encounter (INDEPENDENT_AMBULATORY_CARE_PROVIDER_SITE_OTHER): Payer: Self-pay

## 2013-09-18 ENCOUNTER — Ambulatory Visit: Payer: Medicare HMO

## 2013-09-18 ENCOUNTER — Other Ambulatory Visit (HOSPITAL_BASED_OUTPATIENT_CLINIC_OR_DEPARTMENT_OTHER): Payer: Medicare HMO

## 2013-09-18 DIAGNOSIS — D649 Anemia, unspecified: Secondary | ICD-10-CM

## 2013-09-18 DIAGNOSIS — M545 Low back pain, unspecified: Secondary | ICD-10-CM

## 2013-09-18 DIAGNOSIS — N189 Chronic kidney disease, unspecified: Secondary | ICD-10-CM

## 2013-09-18 DIAGNOSIS — G4700 Insomnia, unspecified: Secondary | ICD-10-CM

## 2013-09-18 DIAGNOSIS — I739 Peripheral vascular disease, unspecified: Secondary | ICD-10-CM

## 2013-09-18 DIAGNOSIS — E118 Type 2 diabetes mellitus with unspecified complications: Secondary | ICD-10-CM

## 2013-09-18 DIAGNOSIS — K219 Gastro-esophageal reflux disease without esophagitis: Secondary | ICD-10-CM

## 2013-09-18 DIAGNOSIS — E785 Hyperlipidemia, unspecified: Secondary | ICD-10-CM

## 2013-09-18 DIAGNOSIS — I2510 Atherosclerotic heart disease of native coronary artery without angina pectoris: Secondary | ICD-10-CM

## 2013-09-18 DIAGNOSIS — I359 Nonrheumatic aortic valve disorder, unspecified: Secondary | ICD-10-CM

## 2013-09-18 DIAGNOSIS — D696 Thrombocytopenia, unspecified: Secondary | ICD-10-CM

## 2013-09-18 DIAGNOSIS — F411 Generalized anxiety disorder: Secondary | ICD-10-CM

## 2013-09-18 DIAGNOSIS — I5032 Chronic diastolic (congestive) heart failure: Secondary | ICD-10-CM

## 2013-09-18 DIAGNOSIS — I6529 Occlusion and stenosis of unspecified carotid artery: Secondary | ICD-10-CM

## 2013-09-18 DIAGNOSIS — I10 Essential (primary) hypertension: Secondary | ICD-10-CM

## 2013-09-18 DIAGNOSIS — I5040 Unspecified combined systolic (congestive) and diastolic (congestive) heart failure: Secondary | ICD-10-CM

## 2013-09-18 LAB — CBC WITH DIFFERENTIAL/PLATELET
BASO%: 0.5 % (ref 0.0–2.0)
BASOS ABS: 0 10*3/uL (ref 0.0–0.1)
EOS%: 5.9 % (ref 0.0–7.0)
Eosinophils Absolute: 0.4 10*3/uL (ref 0.0–0.5)
HEMATOCRIT: 34.1 % — AB (ref 34.8–46.6)
HGB: 11.2 g/dL — ABNORMAL LOW (ref 11.6–15.9)
LYMPH%: 21.8 % (ref 14.0–49.7)
MCH: 35.5 pg — AB (ref 25.1–34.0)
MCHC: 32.9 g/dL (ref 31.5–36.0)
MCV: 107.9 fL — ABNORMAL HIGH (ref 79.5–101.0)
MONO#: 0.9 10*3/uL (ref 0.1–0.9)
MONO%: 12.5 % (ref 0.0–14.0)
NEUT%: 59.3 % (ref 38.4–76.8)
NEUTROS ABS: 4.2 10*3/uL (ref 1.5–6.5)
Platelets: 79 10*3/uL — ABNORMAL LOW (ref 145–400)
RBC: 3.16 10*6/uL — ABNORMAL LOW (ref 3.70–5.45)
RDW: 13.3 % (ref 11.2–14.5)
WBC: 7.1 10*3/uL (ref 3.9–10.3)
lymph#: 1.6 10*3/uL (ref 0.9–3.3)

## 2013-09-18 MED ORDER — DARBEPOETIN ALFA-POLYSORBATE 300 MCG/0.6ML IJ SOLN: 300.0000 ug | Freq: Once | INTRAMUSCULAR | Status: DC

## 2013-10-09 ENCOUNTER — Other Ambulatory Visit (HOSPITAL_BASED_OUTPATIENT_CLINIC_OR_DEPARTMENT_OTHER): Payer: Medicare HMO

## 2013-10-09 ENCOUNTER — Ambulatory Visit (HOSPITAL_BASED_OUTPATIENT_CLINIC_OR_DEPARTMENT_OTHER): Payer: Medicare HMO

## 2013-10-09 VITALS — BP 136/54 | HR 66 | Temp 97.4°F

## 2013-10-09 DIAGNOSIS — D696 Thrombocytopenia, unspecified: Secondary | ICD-10-CM

## 2013-10-09 DIAGNOSIS — D649 Anemia, unspecified: Secondary | ICD-10-CM

## 2013-10-09 DIAGNOSIS — I6529 Occlusion and stenosis of unspecified carotid artery: Secondary | ICD-10-CM

## 2013-10-09 DIAGNOSIS — N189 Chronic kidney disease, unspecified: Secondary | ICD-10-CM

## 2013-10-09 DIAGNOSIS — I739 Peripheral vascular disease, unspecified: Secondary | ICD-10-CM

## 2013-10-09 DIAGNOSIS — E118 Type 2 diabetes mellitus with unspecified complications: Secondary | ICD-10-CM

## 2013-10-09 DIAGNOSIS — K219 Gastro-esophageal reflux disease without esophagitis: Secondary | ICD-10-CM

## 2013-10-09 DIAGNOSIS — I5032 Chronic diastolic (congestive) heart failure: Secondary | ICD-10-CM

## 2013-10-09 DIAGNOSIS — I10 Essential (primary) hypertension: Secondary | ICD-10-CM

## 2013-10-09 DIAGNOSIS — F411 Generalized anxiety disorder: Secondary | ICD-10-CM

## 2013-10-09 DIAGNOSIS — N289 Disorder of kidney and ureter, unspecified: Secondary | ICD-10-CM

## 2013-10-09 DIAGNOSIS — I5040 Unspecified combined systolic (congestive) and diastolic (congestive) heart failure: Secondary | ICD-10-CM

## 2013-10-09 DIAGNOSIS — E785 Hyperlipidemia, unspecified: Secondary | ICD-10-CM

## 2013-10-09 DIAGNOSIS — I2510 Atherosclerotic heart disease of native coronary artery without angina pectoris: Secondary | ICD-10-CM

## 2013-10-09 DIAGNOSIS — G4700 Insomnia, unspecified: Secondary | ICD-10-CM

## 2013-10-09 DIAGNOSIS — D638 Anemia in other chronic diseases classified elsewhere: Secondary | ICD-10-CM

## 2013-10-09 DIAGNOSIS — M545 Low back pain, unspecified: Secondary | ICD-10-CM

## 2013-10-09 DIAGNOSIS — I359 Nonrheumatic aortic valve disorder, unspecified: Secondary | ICD-10-CM

## 2013-10-09 LAB — CBC WITH DIFFERENTIAL/PLATELET
BASO%: 0.4 % (ref 0.0–2.0)
BASOS ABS: 0 10*3/uL (ref 0.0–0.1)
EOS ABS: 0.3 10*3/uL (ref 0.0–0.5)
EOS%: 5 % (ref 0.0–7.0)
HEMATOCRIT: 27.8 % — AB (ref 34.8–46.6)
HGB: 9.3 g/dL — ABNORMAL LOW (ref 11.6–15.9)
LYMPH#: 1.7 10*3/uL (ref 0.9–3.3)
LYMPH%: 31.1 % (ref 14.0–49.7)
MCH: 34.6 pg — ABNORMAL HIGH (ref 25.1–34.0)
MCHC: 33.5 g/dL (ref 31.5–36.0)
MCV: 103.3 fL — ABNORMAL HIGH (ref 79.5–101.0)
MONO#: 0.5 10*3/uL (ref 0.1–0.9)
MONO%: 9.3 % (ref 0.0–14.0)
NEUT%: 54.2 % (ref 38.4–76.8)
NEUTROS ABS: 2.9 10*3/uL (ref 1.5–6.5)
Platelets: 76 10*3/uL — ABNORMAL LOW (ref 145–400)
RBC: 2.69 10*6/uL — ABNORMAL LOW (ref 3.70–5.45)
RDW: 13.2 % (ref 11.2–14.5)
WBC: 5.4 10*3/uL (ref 3.9–10.3)
nRBC: 0 % (ref 0–0)

## 2013-10-09 MED ORDER — DARBEPOETIN ALFA-POLYSORBATE 300 MCG/0.6ML IJ SOLN
300.0000 ug | Freq: Once | INTRAMUSCULAR | Status: AC
  Administered 2013-10-09: 300 ug via SUBCUTANEOUS
  Filled 2013-10-09: qty 0.6

## 2013-10-30 ENCOUNTER — Encounter: Payer: Self-pay | Admitting: Oncology

## 2013-10-30 ENCOUNTER — Other Ambulatory Visit (HOSPITAL_BASED_OUTPATIENT_CLINIC_OR_DEPARTMENT_OTHER): Payer: Medicare HMO

## 2013-10-30 ENCOUNTER — Telehealth: Payer: Self-pay | Admitting: Oncology

## 2013-10-30 ENCOUNTER — Ambulatory Visit (HOSPITAL_BASED_OUTPATIENT_CLINIC_OR_DEPARTMENT_OTHER): Payer: Medicare HMO | Admitting: Oncology

## 2013-10-30 VITALS — BP 127/54 | HR 63 | Temp 97.3°F | Resp 18 | Ht 68.0 in | Wt 140.8 lb

## 2013-10-30 DIAGNOSIS — K219 Gastro-esophageal reflux disease without esophagitis: Secondary | ICD-10-CM

## 2013-10-30 DIAGNOSIS — I2510 Atherosclerotic heart disease of native coronary artery without angina pectoris: Secondary | ICD-10-CM

## 2013-10-30 DIAGNOSIS — D696 Thrombocytopenia, unspecified: Secondary | ICD-10-CM

## 2013-10-30 DIAGNOSIS — I5040 Unspecified combined systolic (congestive) and diastolic (congestive) heart failure: Secondary | ICD-10-CM

## 2013-10-30 DIAGNOSIS — N289 Disorder of kidney and ureter, unspecified: Secondary | ICD-10-CM

## 2013-10-30 DIAGNOSIS — D649 Anemia, unspecified: Secondary | ICD-10-CM

## 2013-10-30 DIAGNOSIS — I359 Nonrheumatic aortic valve disorder, unspecified: Secondary | ICD-10-CM

## 2013-10-30 DIAGNOSIS — I10 Essential (primary) hypertension: Secondary | ICD-10-CM

## 2013-10-30 DIAGNOSIS — E118 Type 2 diabetes mellitus with unspecified complications: Secondary | ICD-10-CM

## 2013-10-30 DIAGNOSIS — I5032 Chronic diastolic (congestive) heart failure: Secondary | ICD-10-CM

## 2013-10-30 DIAGNOSIS — F411 Generalized anxiety disorder: Secondary | ICD-10-CM

## 2013-10-30 DIAGNOSIS — M545 Low back pain, unspecified: Secondary | ICD-10-CM

## 2013-10-30 DIAGNOSIS — N189 Chronic kidney disease, unspecified: Secondary | ICD-10-CM

## 2013-10-30 DIAGNOSIS — E785 Hyperlipidemia, unspecified: Secondary | ICD-10-CM

## 2013-10-30 DIAGNOSIS — G4700 Insomnia, unspecified: Secondary | ICD-10-CM

## 2013-10-30 DIAGNOSIS — I739 Peripheral vascular disease, unspecified: Secondary | ICD-10-CM

## 2013-10-30 DIAGNOSIS — I6529 Occlusion and stenosis of unspecified carotid artery: Secondary | ICD-10-CM

## 2013-10-30 LAB — CBC WITH DIFFERENTIAL/PLATELET
BASO%: 0.8 % (ref 0.0–2.0)
Basophils Absolute: 0 10*3/uL (ref 0.0–0.1)
EOS ABS: 0.2 10*3/uL (ref 0.0–0.5)
EOS%: 4.9 % (ref 0.0–7.0)
HCT: 32.6 % — ABNORMAL LOW (ref 34.8–46.6)
HEMOGLOBIN: 10.7 g/dL — AB (ref 11.6–15.9)
LYMPH#: 1.3 10*3/uL (ref 0.9–3.3)
LYMPH%: 27.1 % (ref 14.0–49.7)
MCH: 36.3 pg — ABNORMAL HIGH (ref 25.1–34.0)
MCHC: 33 g/dL (ref 31.5–36.0)
MCV: 110 fL — ABNORMAL HIGH (ref 79.5–101.0)
MONO#: 0.7 10*3/uL (ref 0.1–0.9)
MONO%: 13.7 % (ref 0.0–14.0)
NEUT#: 2.5 10*3/uL (ref 1.5–6.5)
NEUT%: 53.5 % (ref 38.4–76.8)
Platelets: 76 10*3/uL — ABNORMAL LOW (ref 145–400)
RBC: 2.96 10*6/uL — ABNORMAL LOW (ref 3.70–5.45)
RDW: 14.7 % — AB (ref 11.2–14.5)
WBC: 4.8 10*3/uL (ref 3.9–10.3)

## 2013-10-30 MED ORDER — DARBEPOETIN ALFA-POLYSORBATE 300 MCG/0.6ML IJ SOLN
300.0000 ug | Freq: Once | INTRAMUSCULAR | Status: AC
  Administered 2013-10-30: 300 ug via SUBCUTANEOUS
  Filled 2013-10-30: qty 0.6

## 2013-10-30 NOTE — Progress Notes (Signed)
Hematology and Oncology Follow Up Visit  [name] [id number] 07-04-1941 73 y.o. 10/30/2013 9:00 AM [name], MDWilson, [name], MD   Principle Diagnosis: This is a 73 year old female with multifactorial anemia. She has anemia of chronic disease, anemia of renal disease, diagnosed in 2011.  Current therapy: Aranesp 300 mcg subcutaneously every 3 weeks keeping hemoglobin above 11.  Interim History:  [name] presents today for a followup visit with her husband. This is a very pleasant woman with renal insufficiency and chronic anemia. She has been on Aranesp and have helped her overall quality of life and improvement in her performance status. She had reported some increase in her energy, increase in her appetite. She denies any recent falls but requires assistance from her husband for certain activities of daily living. She has not had any problems associated with Aranesp. She had not had any injection or infusion problem and denied any toxicities. She has not noticed any bleeding or thrombosis. She reports no recent illnesses or hospitalizations. She has not reported any headaches or blurry vision or double vision. She does not report any chest pain shortness of breath or difficulty breathing. She does not report any nausea or vomiting or abdominal pain. She does not report any genitourinary complaints. She has not reported any bleeding or clotting tendencies. She has not reported any lymphadenopathy or petechiae.  Medications: I have reviewed the patient's current medications.  Current Outpatient Prescriptions  Medication Sig Dispense Refill  . losartan (COZAAR) 50 MG tablet Take 50 mg by mouth daily. Patient unsure of dosage.      [name] ALPRAZolam (XANAX) 0.5 MG tablet Take 0.5 mg by mouth daily.      [name] amLODipine (NORVASC) 5 MG tablet Take 2.5 mg by mouth daily.   1 tablet  0  . aspirin 81 MG tablet Take 81 mg by mouth daily.       . carvedilol (COREG) 12.5 MG tablet TAKE 1 TABLET BY  MOUTH TWICE A DAY  60 tablet  11  . CRESTOR 5 MG tablet TAKE 1 TABLET DAILY  90 tablet  3  . cyanocobalamin 1000 MCG tablet Take 100 mcg by mouth daily.      . metFORMIN (GLUCOPHAGE) 1000 MG tablet Take 500 mg by mouth 2 (two) times daily with a meal.       . Multiple Vitamin (MULTIVITAMIN) capsule Take 1 capsule by mouth daily.      . niacin (NIASPAN) 1000 MG CR tablet Take 1,000 mg by mouth at bedtime.       [name] omeprazole (PRILOSEC) 20 MG capsule Take 20 mg by mouth daily. As needed      . timolol (BETIMOL) 0.5 % ophthalmic solution 1 drop 2 (two) times daily.      . [DISCONTINUED] metoprolol succinate (TOPROL-XL) 50 MG 24 hr tablet Take 50 mg by mouth 2 (two) times daily. Take with or immediately following a meal.       Current Facility-Administered Medications  Medication Dose Route Frequency Provider Last Rate Last Dose  . darbepoetin (ARANESP) injection 300 mcg  300 mcg Subcutaneous Once Maryanna Shape, NP         Allergies: No Known Allergies  Past Medical History, Surgical history, Social history, and Family History were reviewed and updated.   Remaining ROS negative.  Physical Exam: Blood pressure 127/54, pulse 63, temperature 97.3 F (36.3 C), temperature source Oral, resp. rate 18, height 5\' 8"  (1.727 m), weight 140 lb 12.8 oz (63.866 kg),  SpO2 100.00%. ECOG: 1 General appearance: alert, cooperative and no distress Head: Normocephalic, without obvious abnormality, atraumatic Neck: no adenopathy, no carotid bruit, no JVD, supple, symmetrical, trachea midline and thyroid not enlarged, symmetric, no tenderness/mass/nodules Lymph nodes: Cervical, supraclavicular, and axillary nodes normal. Heart:regular rate and rhythm, S1, S2 normal, no murmur, click, rub or gallop Lung:chest clear, no wheezing, rales, normal symmetric air entry, no tachypnea, retractions or cyanosis Abdomen: soft, non-tender, without masses or organomegaly EXT:no erythema, induration, or nodules   Lab  Results: Lab Results  Component Value Date   WBC 4.8 10/30/2013   HGB 10.7* 10/30/2013   HCT 32.6* 10/30/2013   MCV 110.0* 10/30/2013   PLT 76* 10/30/2013     Chemistry      Component Value Date/Time   NA 135* 08/16/2012 0757   NA 134* 12/26/2011 0933   K 4.6 08/16/2012 0757   K 4.4 12/26/2011 0933   CL 101 08/16/2012 0757   CL 101 12/26/2011 0933   CO2 21* 08/16/2012 0757   CO2 20 12/26/2011 0933   BUN 25.0 08/16/2012 0757   BUN 27* 12/26/2011 0933   CREATININE 2.1* 08/16/2012 0757   CREATININE 1.9* 12/26/2011 0933      Component Value Date/Time   CALCIUM 9.2 08/16/2012 0757   CALCIUM 8.8 12/26/2011 0933   ALKPHOS 91 08/16/2012 0757   ALKPHOS 75 12/26/2011 0933   AST 21 08/16/2012 0757   AST 23 12/26/2011 0933   ALT 10 08/16/2012 0757   ALT 14 12/26/2011 0933   BILITOT 0.50 08/16/2012 0757   BILITOT 0.6 12/26/2011 0933     Impression and Plan:   73 year old female with the following issues:   1. 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 We will keep her on Aranesp, keep her hemoglobin above 11. Hemoglobin is 10.7 Aranesp will be given today.   2. Thrombocytopenia. Her platelet count is stable. No major bleeding at this time. However, we will continue to monitor her blood counts closely. The likelihood of possibly developing myelodysplastic syndrome is always a possibility.   3. Follow-up: CBC and Aranesp injection every 3 weeks. Visit in 4 months.       [id number] 3/18/20159:00 AM

## 2013-10-30 NOTE — Patient Instructions (Signed)
Darbepoetin Alfa injection What is this medicine? DARBEPOETIN ALFA (dar be POE e tin AL fa) helps your body make more red blood cells. It is used to treat anemia caused by chronic kidney failure and chemotherapy. This medicine may be used for other purposes; ask your health care provider or pharmacist if you have questions. COMMON BRAND NAME(S): Aranesp What should I tell my health care provider before I take this medicine? They need to know if you have any of these conditions: -blood clotting disorders or history of blood clots -cancer patient not on chemotherapy -cystic fibrosis -heart disease, such as angina, heart failure, or a history of a heart attack -hemoglobin level of 12 g/dL or greater -high blood pressure -low levels of folate, iron, or vitamin B12 -seizures -an unusual or allergic reaction to darbepoetin, erythropoietin, albumin, hamster proteins, latex, other medicines, foods, dyes, or preservatives -pregnant or trying to get pregnant -breast-feeding How should I use this medicine? This medicine is for injection into a vein or under the skin. It is usually given by a health care professional in a hospital or clinic setting. If you get this medicine at home, you will be taught how to prepare and give this medicine. Do not shake the solution before you withdraw a dose. Use exactly as directed. Take your medicine at regular intervals. Do not take your medicine more often than directed. It is important that you put your used needles and syringes in a special sharps container. Do not put them in a trash can. If you do not have a sharps container, call your pharmacist or healthcare provider to get one. Talk to your pediatrician regarding the use of this medicine in children. While this medicine may be used in children as young as 1 year for selected conditions, precautions do apply. Overdosage: If you think you have taken too much of this medicine contact a poison control center or  emergency room at once. NOTE: This medicine is only for you. Do not share this medicine with others. What if I miss a dose? If you miss a dose, take it as soon as you can. If it is almost time for your next dose, take only that dose. Do not take double or extra doses. What may interact with this medicine? Do not take this medicine with any of the following medications: -epoetin alfa This list may not describe all possible interactions. Give your health care provider a list of all the medicines, herbs, non-prescription drugs, or dietary supplements you use. Also tell them if you smoke, drink alcohol, or use illegal drugs. Some items may interact with your medicine. What should I watch for while using this medicine? Visit your prescriber or health care professional for regular checks on your progress and for the needed blood tests and blood pressure measurements. It is especially important for the doctor to make sure your hemoglobin level is in the desired range, to limit the risk of potential side effects and to give you the best benefit. Keep all appointments for any recommended tests. Check your blood pressure as directed. Ask your doctor what your blood pressure should be and when you should contact him or her. As your body makes more red blood cells, you may need to take iron, folic acid, or vitamin B supplements. Ask your doctor or health care provider which products are right for you. If you have kidney disease continue dietary restrictions, even though this medication can make you feel better. Talk with your doctor or health   care professional about the foods you eat and the vitamins that you take. What side effects may I notice from receiving this medicine? Side effects that you should report to your doctor or health care professional as soon as possible: -allergic reactions like skin rash, itching or hives, swelling of the face, lips, or tongue -breathing problems -changes in vision -chest  pain -confusion, trouble speaking or understanding -feeling faint or lightheaded, falls -high blood pressure -muscle aches or pains -pain, swelling, warmth in the leg -rapid weight gain -severe headaches -sudden numbness or weakness of the face, arm or leg -trouble walking, dizziness, loss of balance or coordination -seizures (convulsions) -swelling of the ankles, feet, hands -unusually weak or tired Side effects that usually do not require medical attention (report to your doctor or health care professional if they continue or are bothersome): -diarrhea -fever, chills (flu-like symptoms) -headaches -nausea, vomiting -redness, stinging, or swelling at site where injected This list may not describe all possible side effects. Call your doctor for medical advice about side effects. You may report side effects to FDA at 1-800-FDA-1088. Where should I keep my medicine? Keep out of the reach of children. Store in a refrigerator between 2 and 8 degrees C (36 and 46 degrees F). Do not freeze. Do not shake. Throw away any unused portion if using a single-dose vial. Throw away any unused medicine after the expiration date. NOTE: This sheet is a summary. It may not cover all possible information. If you have questions about this medicine, talk to your doctor, pharmacist, or health care provider.  2014, Elsevier/Gold Standard. (2008-07-15 10:23:57)  

## 2013-10-30 NOTE — Telephone Encounter (Signed)
gv adn pritned appt sched for pt for April thru July

## 2013-11-20 ENCOUNTER — Other Ambulatory Visit (HOSPITAL_BASED_OUTPATIENT_CLINIC_OR_DEPARTMENT_OTHER): Payer: Medicare HMO

## 2013-11-20 ENCOUNTER — Ambulatory Visit: Payer: Medicare HMO

## 2013-11-20 DIAGNOSIS — G4700 Insomnia, unspecified: Secondary | ICD-10-CM

## 2013-11-20 DIAGNOSIS — D649 Anemia, unspecified: Secondary | ICD-10-CM

## 2013-11-20 DIAGNOSIS — N189 Chronic kidney disease, unspecified: Secondary | ICD-10-CM

## 2013-11-20 DIAGNOSIS — I739 Peripheral vascular disease, unspecified: Secondary | ICD-10-CM

## 2013-11-20 DIAGNOSIS — D631 Anemia in chronic kidney disease: Secondary | ICD-10-CM

## 2013-11-20 DIAGNOSIS — I5032 Chronic diastolic (congestive) heart failure: Secondary | ICD-10-CM

## 2013-11-20 DIAGNOSIS — K219 Gastro-esophageal reflux disease without esophagitis: Secondary | ICD-10-CM

## 2013-11-20 DIAGNOSIS — I6529 Occlusion and stenosis of unspecified carotid artery: Secondary | ICD-10-CM

## 2013-11-20 DIAGNOSIS — E785 Hyperlipidemia, unspecified: Secondary | ICD-10-CM

## 2013-11-20 DIAGNOSIS — M545 Low back pain, unspecified: Secondary | ICD-10-CM

## 2013-11-20 DIAGNOSIS — N039 Chronic nephritic syndrome with unspecified morphologic changes: Secondary | ICD-10-CM

## 2013-11-20 DIAGNOSIS — E118 Type 2 diabetes mellitus with unspecified complications: Secondary | ICD-10-CM

## 2013-11-20 DIAGNOSIS — D696 Thrombocytopenia, unspecified: Secondary | ICD-10-CM

## 2013-11-20 DIAGNOSIS — I5040 Unspecified combined systolic (congestive) and diastolic (congestive) heart failure: Secondary | ICD-10-CM

## 2013-11-20 DIAGNOSIS — I359 Nonrheumatic aortic valve disorder, unspecified: Secondary | ICD-10-CM

## 2013-11-20 DIAGNOSIS — I2510 Atherosclerotic heart disease of native coronary artery without angina pectoris: Secondary | ICD-10-CM

## 2013-11-20 DIAGNOSIS — I10 Essential (primary) hypertension: Secondary | ICD-10-CM

## 2013-11-20 DIAGNOSIS — F411 Generalized anxiety disorder: Secondary | ICD-10-CM

## 2013-11-20 LAB — CBC WITH DIFFERENTIAL/PLATELET
BASO%: 0.6 % (ref 0.0–2.0)
BASOS ABS: 0 10*3/uL (ref 0.0–0.1)
EOS ABS: 0.2 10*3/uL (ref 0.0–0.5)
EOS%: 4.3 % (ref 0.0–7.0)
HEMATOCRIT: 34.5 % — AB (ref 34.8–46.6)
HEMOGLOBIN: 11.4 g/dL — AB (ref 11.6–15.9)
LYMPH%: 23.9 % (ref 14.0–49.7)
MCH: 36.6 pg — AB (ref 25.1–34.0)
MCHC: 33.2 g/dL (ref 31.5–36.0)
MCV: 110.3 fL — AB (ref 79.5–101.0)
MONO#: 0.6 10*3/uL (ref 0.1–0.9)
MONO%: 12.8 % (ref 0.0–14.0)
NEUT%: 58.4 % (ref 38.4–76.8)
NEUTROS ABS: 2.9 10*3/uL (ref 1.5–6.5)
Platelets: 75 10*3/uL — ABNORMAL LOW (ref 145–400)
RBC: 3.13 10*6/uL — ABNORMAL LOW (ref 3.70–5.45)
RDW: 13.9 % (ref 11.2–14.5)
WBC: 5 10*3/uL (ref 3.9–10.3)
lymph#: 1.2 10*3/uL (ref 0.9–3.3)

## 2013-11-20 MED ORDER — DARBEPOETIN ALFA-POLYSORBATE 300 MCG/0.6ML IJ SOLN: 300.0000 ug | Freq: Once | INTRAMUSCULAR | Status: DC

## 2013-12-09 ENCOUNTER — Other Ambulatory Visit: Payer: Self-pay | Admitting: Cardiology

## 2013-12-11 ENCOUNTER — Other Ambulatory Visit (HOSPITAL_BASED_OUTPATIENT_CLINIC_OR_DEPARTMENT_OTHER): Payer: Medicare HMO

## 2013-12-11 ENCOUNTER — Ambulatory Visit (HOSPITAL_BASED_OUTPATIENT_CLINIC_OR_DEPARTMENT_OTHER): Payer: Medicare HMO

## 2013-12-11 VITALS — BP 152/70 | HR 59 | Temp 97.6°F

## 2013-12-11 DIAGNOSIS — E785 Hyperlipidemia, unspecified: Secondary | ICD-10-CM

## 2013-12-11 DIAGNOSIS — G4700 Insomnia, unspecified: Secondary | ICD-10-CM

## 2013-12-11 DIAGNOSIS — F411 Generalized anxiety disorder: Secondary | ICD-10-CM

## 2013-12-11 DIAGNOSIS — I5032 Chronic diastolic (congestive) heart failure: Secondary | ICD-10-CM

## 2013-12-11 DIAGNOSIS — I359 Nonrheumatic aortic valve disorder, unspecified: Secondary | ICD-10-CM

## 2013-12-11 DIAGNOSIS — I739 Peripheral vascular disease, unspecified: Secondary | ICD-10-CM

## 2013-12-11 DIAGNOSIS — N289 Disorder of kidney and ureter, unspecified: Secondary | ICD-10-CM

## 2013-12-11 DIAGNOSIS — K219 Gastro-esophageal reflux disease without esophagitis: Secondary | ICD-10-CM

## 2013-12-11 DIAGNOSIS — D696 Thrombocytopenia, unspecified: Secondary | ICD-10-CM

## 2013-12-11 DIAGNOSIS — D649 Anemia, unspecified: Secondary | ICD-10-CM

## 2013-12-11 DIAGNOSIS — I2510 Atherosclerotic heart disease of native coronary artery without angina pectoris: Secondary | ICD-10-CM

## 2013-12-11 DIAGNOSIS — M545 Low back pain, unspecified: Secondary | ICD-10-CM

## 2013-12-11 DIAGNOSIS — I5040 Unspecified combined systolic (congestive) and diastolic (congestive) heart failure: Secondary | ICD-10-CM

## 2013-12-11 DIAGNOSIS — N189 Chronic kidney disease, unspecified: Secondary | ICD-10-CM

## 2013-12-11 DIAGNOSIS — E118 Type 2 diabetes mellitus with unspecified complications: Secondary | ICD-10-CM

## 2013-12-11 DIAGNOSIS — I6529 Occlusion and stenosis of unspecified carotid artery: Secondary | ICD-10-CM

## 2013-12-11 DIAGNOSIS — I10 Essential (primary) hypertension: Secondary | ICD-10-CM

## 2013-12-11 LAB — CBC WITH DIFFERENTIAL/PLATELET
BASO%: 0.7 % (ref 0.0–2.0)
Basophils Absolute: 0.1 10*3/uL (ref 0.0–0.1)
EOS ABS: 0.3 10*3/uL (ref 0.0–0.5)
EOS%: 3.4 % (ref 0.0–7.0)
HCT: 32.7 % — ABNORMAL LOW (ref 34.8–46.6)
HGB: 10.9 g/dL — ABNORMAL LOW (ref 11.6–15.9)
LYMPH%: 24 % (ref 14.0–49.7)
MCH: 34.9 pg — AB (ref 25.1–34.0)
MCHC: 33.3 g/dL (ref 31.5–36.0)
MCV: 104.8 fL — AB (ref 79.5–101.0)
MONO#: 0.7 10*3/uL (ref 0.1–0.9)
MONO%: 8.9 % (ref 0.0–14.0)
NEUT%: 63 % (ref 38.4–76.8)
NEUTROS ABS: 4.7 10*3/uL (ref 1.5–6.5)
PLATELETS: 86 10*3/uL — AB (ref 145–400)
RBC: 3.12 10*6/uL — ABNORMAL LOW (ref 3.70–5.45)
RDW: 12.6 % (ref 11.2–14.5)
WBC: 7.4 10*3/uL (ref 3.9–10.3)
lymph#: 1.8 10*3/uL (ref 0.9–3.3)

## 2013-12-11 MED ORDER — DARBEPOETIN ALFA-POLYSORBATE 300 MCG/0.6ML IJ SOLN
300.0000 ug | Freq: Once | INTRAMUSCULAR | Status: AC
  Administered 2013-12-11: 300 ug via SUBCUTANEOUS
  Filled 2013-12-11: qty 0.6

## 2013-12-20 ENCOUNTER — Encounter: Payer: Self-pay | Admitting: Cardiology

## 2013-12-20 ENCOUNTER — Ambulatory Visit (INDEPENDENT_AMBULATORY_CARE_PROVIDER_SITE_OTHER): Payer: Medicare HMO | Admitting: Cardiology

## 2013-12-20 ENCOUNTER — Ambulatory Visit (HOSPITAL_COMMUNITY): Payer: Medicare HMO | Attending: Cardiology | Admitting: Radiology

## 2013-12-20 ENCOUNTER — Other Ambulatory Visit (HOSPITAL_COMMUNITY): Payer: Self-pay | Admitting: Cardiology

## 2013-12-20 VITALS — BP 155/57 | HR 61 | Ht 68.0 in | Wt 139.0 lb

## 2013-12-20 DIAGNOSIS — I10 Essential (primary) hypertension: Secondary | ICD-10-CM

## 2013-12-20 DIAGNOSIS — I6529 Occlusion and stenosis of unspecified carotid artery: Secondary | ICD-10-CM

## 2013-12-20 DIAGNOSIS — I25810 Atherosclerosis of coronary artery bypass graft(s) without angina pectoris: Secondary | ICD-10-CM

## 2013-12-20 DIAGNOSIS — R2689 Other abnormalities of gait and mobility: Secondary | ICD-10-CM

## 2013-12-20 DIAGNOSIS — I359 Nonrheumatic aortic valve disorder, unspecified: Secondary | ICD-10-CM

## 2013-12-20 DIAGNOSIS — R29818 Other symptoms and signs involving the nervous system: Secondary | ICD-10-CM

## 2013-12-20 DIAGNOSIS — I2510 Atherosclerotic heart disease of native coronary artery without angina pectoris: Secondary | ICD-10-CM

## 2013-12-20 LAB — BASIC METABOLIC PANEL
BUN: 29 mg/dL — ABNORMAL HIGH (ref 6–23)
CALCIUM: 10.1 mg/dL (ref 8.4–10.5)
CHLORIDE: 105 meq/L (ref 96–112)
CO2: 24 mEq/L (ref 19–32)
CREATININE: 2.4 mg/dL — AB (ref 0.4–1.2)
GFR: 21.61 mL/min — AB (ref >60.00)
Glucose, Bld: 128 mg/dL — ABNORMAL HIGH (ref 70–99)
Potassium: 4.6 mEq/L (ref 3.5–5.1)
Sodium: 138 mEq/L (ref 135–145)

## 2013-12-20 LAB — LIPID PANEL
CHOLESTEROL: 120 mg/dL (ref 0–200)
HDL: 65.6 mg/dL (ref >39.00)
LDL CALC: 29 mg/dL (ref 0–99)
TRIGLYCERIDES: 128 mg/dL (ref 0.0–149.0)
Total CHOL/HDL Ratio: 2
VLDL: 25.6 mg/dL (ref 0.0–40.0)

## 2013-12-20 MED ORDER — AMLODIPINE BESYLATE 5 MG PO TABS: 5.0000 mg | ORAL_TABLET | Freq: Every day | ORAL | Status: DC

## 2013-12-20 NOTE — Patient Instructions (Addendum)
Your physician has recommended you make the following change in your medication: Increase amlodipine to 5 MG daily  Your physician recommends that you go to the lab today for LIPID and BMET  Your physician has requested that you have a carotid duplex. This test is an ultrasound of the carotid arteries in your neck. It looks at blood flow through these arteries that supply the brain with blood. Allow one hour for this exam. There are no restrictions or special instructions.  You have been referred to [facility]  You have been referred to Physical Therapy for your Balance  Your physician wants you to follow-up in: 1 year with Dr Kendall Flack will receive a reminder letter in the mail two months in advance. If you don't receive a letter, please call our office to schedule the follow-up appointment.

## 2013-12-20 NOTE — Progress Notes (Signed)
Echocardiogram performed.  

## 2013-12-22 DIAGNOSIS — R2689 Other abnormalities of gait and mobility: Secondary | ICD-10-CM | POA: Insufficient documentation

## 2013-12-22 NOTE — Progress Notes (Signed)
Patient ID: [name], female   DOB: October 31, 1940, 73 y.o.   MRN: [id number] PCP: Dr. Kathryne Eriksson  73 yo with h/o CAD s/p CABG and diastolic CHF presents for followup.  She is stable.  I reviewed her echo today: EF remains normal and aortic stenosis remains mild.  She can walk on flat ground without dyspnea.  She does have some mild shortness of breath walking up steps.  No chest pain, no lightheadedness or palpitations.  She continues to have quite significant balance difficulty but has had no recent falls.  She is walking with a cane.  She continues to follow with hematology for anemia and thrombocytopenia.  She gets Aranesp injections.  BP has been running high.   ECG: NSR, old ASMI  Labs (4/10): creatinine 1.9  Labs (7/10): LDL 49, HDL 51, LFTs normal  Labs (1/11): K 5.0, creatinine 1.4  Labs (2/11): LDL 51, HDL 62, HCT 27  Labs (5/12): K 4.4, creatinine 1.77 Labs (2/13): HCT 36.5 Labs (5/13): K 4.4, creatinine 1.9 Labs (7/13): HDL 67, LDL 32 Labs (8/13): HCT 33.5, plts 67 Labs (11/13): K 4.7, creatinine 2.1, LDL 37, HDL 73 Labs (4/14): HCT 31.4   Allergies (verified):  No Known Drug Allergies   Past Medical History:  1. CAD: Pt presented 2/10 to [facility] with NSTEMI and diastolic CHF exacerbation. LHC was done 3/10 showing 99% pRCA stenosis and 80% calcified pLAD stenosis with L=>R collaterals. Pt was referred for CABG which was done by Dr. Prescott Gum with LIMA-LAD, SVG-RCA, SVG-OM.  2. Diastolic CHF: Echo ([id number]) showed EF 55-65%, mild LVH, diastolic dysfunction, mild AS with mean gradient 12 mmHg, PASP 43 mmHg. Echo (2/12): EF 55-60%, mild LVH, mild AS (mean gradient 12), PA systolic pressure 32 mmHg.  Echo (5/15) with EF 55%, mild MR, mild aortic stenosis.  3. CKD 4. DM2  5. HTN  6. Hyperlipidemia: nausea/GI discomfort with atorvastatin, ? Balance problems with pravastatin.  7. Anxiety  8. Chronic low back pain  9. History of thrombocytopenia  10. Anemia: Anemia of chronic  disease and renal disease.  Followed by hematology, getting Aranesp injections 11. Aortic stenosis: mean gradient 12 mmHg in 2/12. Mean gradient 18 mmHg with AVA 1.6 cm^2 in 5/15.  12. Carotid stenosis: 40-59% bilateral ICA stenosis in [id number]. 123456 LICA stenosis in [id number].  Carotids (0000000) with 123456 LICA stenosis.  13. PAD: ABIs 3/12 were normal but TBIs were mildly decreased.  ABIs/TBIs (10/13) normal.   Family History:  Pt was adopted, does not know biological family's history.   Social History:  Married, lives in [place]. Retired from [employer]. Quit smoking around 1990. Occasional ETOH. Has 1 son.   Review of Systems  All systems reviewed and negative except as per HPI.   Current Outpatient Prescriptions  Medication Sig Dispense Refill  . ALPRAZolam (XANAX) 0.5 MG tablet Take 0.5 mg by mouth daily.      [name] amLODipine (NORVASC) 5 MG tablet Take 1 tablet (5 mg total) by mouth daily.  30 tablet  11  . aspirin 81 MG tablet Take 81 mg by mouth daily.       . carvedilol (COREG) 12.5 MG tablet TAKE 1 TABLET BY MOUTH TWICE A DAY  60 tablet  0  . CRESTOR 5 MG tablet TAKE 1 TABLET DAILY  90 tablet  3  . cyanocobalamin 1000 MCG tablet Take 100 mcg by mouth daily.      [name] losartan (COZAAR) 50 MG tablet Take 50 mg  by mouth daily. Patient unsure of dosage.      . metFORMIN (GLUCOPHAGE) 1000 MG tablet Take 500 mg by mouth 2 (two) times daily with a meal.       . Multiple Vitamin (MULTIVITAMIN) capsule Take 1 capsule by mouth daily.      . niacin (NIASPAN) 1000 MG CR tablet Take 1,000 mg by mouth at bedtime.       [name] omeprazole (PRILOSEC) 20 MG capsule Take 20 mg by mouth daily. As needed      . timolol (BETIMOL) 0.5 % ophthalmic solution 1 drop 2 (two) times daily.      . [DISCONTINUED] metoprolol succinate (TOPROL-XL) 50 MG 24 hr tablet Take 50 mg by mouth 2 (two) times daily. Take with or immediately following a meal.       No current facility-administered medications for this visit.    BP  155/57  Pulse 61  Ht 5\' 8"  (1.727 m)  Wt 63.05 kg (139 lb)  BMI 21.14 kg/m2 General: NAD Neck: No JVD, no thyromegaly or thyroid nodule.  Lungs: Clear to auscultation bilaterally with normal respiratory effort. CV: Nondisplaced PMI.  Heart regular S1/S2, no S3/S4, 2/6 SEM RUSB, S2 heard clearly.  No edema.  Left carotid bruit.  Unable to palpate pedal pulses but feet warm.  Abdomen: Soft, nontender, no hepatosplenomegaly, no distention.  Neurologic: Alert and oriented x 3.  Psych: Normal affect. Extremities: No clubbing or cyanosis.   Assessment/Plan 1. CAD Status post CABG. No ischemic symptoms. Continue ASA 81, Coreg, ARB, and statin. 2. AORTIC STENOSIS  Mild aortic stenosis. Stable murmur and symptoms.  If no new symptoms, repeat echo in 2 years.  3. CAROTID ARTERY STENOSIS  I will obtain carotid dopplers.  4. HYPERTENSION BP high, increase amlodipine to 5 mg daily.  5. Hyperlipidemia Check lipids today.  6. Dysequilibrium Patient has very prominent difficulty with balance.  This seems to be worsening.  She has never had a neurological evaluation, I will refer her to neurology.  I will also refer to PT for balance training.   Larey Dresser 12/22/2013

## 2013-12-23 ENCOUNTER — Other Ambulatory Visit: Payer: Self-pay | Admitting: *Deleted

## 2013-12-23 DIAGNOSIS — R2689 Other abnormalities of gait and mobility: Secondary | ICD-10-CM

## 2013-12-23 DIAGNOSIS — R29818 Other symptoms and signs involving the nervous system: Secondary | ICD-10-CM

## 2014-01-01 ENCOUNTER — Other Ambulatory Visit (HOSPITAL_BASED_OUTPATIENT_CLINIC_OR_DEPARTMENT_OTHER): Payer: Medicare HMO

## 2014-01-01 ENCOUNTER — Ambulatory Visit: Payer: Medicare HMO

## 2014-01-01 DIAGNOSIS — D649 Anemia, unspecified: Secondary | ICD-10-CM

## 2014-01-01 DIAGNOSIS — I2510 Atherosclerotic heart disease of native coronary artery without angina pectoris: Secondary | ICD-10-CM

## 2014-01-01 DIAGNOSIS — I6529 Occlusion and stenosis of unspecified carotid artery: Secondary | ICD-10-CM

## 2014-01-01 DIAGNOSIS — I739 Peripheral vascular disease, unspecified: Secondary | ICD-10-CM

## 2014-01-01 DIAGNOSIS — E785 Hyperlipidemia, unspecified: Secondary | ICD-10-CM

## 2014-01-01 DIAGNOSIS — I10 Essential (primary) hypertension: Secondary | ICD-10-CM

## 2014-01-01 DIAGNOSIS — F411 Generalized anxiety disorder: Secondary | ICD-10-CM

## 2014-01-01 DIAGNOSIS — M545 Low back pain, unspecified: Secondary | ICD-10-CM

## 2014-01-01 DIAGNOSIS — G4700 Insomnia, unspecified: Secondary | ICD-10-CM

## 2014-01-01 DIAGNOSIS — N189 Chronic kidney disease, unspecified: Secondary | ICD-10-CM

## 2014-01-01 DIAGNOSIS — E118 Type 2 diabetes mellitus with unspecified complications: Secondary | ICD-10-CM

## 2014-01-01 DIAGNOSIS — I359 Nonrheumatic aortic valve disorder, unspecified: Secondary | ICD-10-CM

## 2014-01-01 DIAGNOSIS — D696 Thrombocytopenia, unspecified: Secondary | ICD-10-CM

## 2014-01-01 DIAGNOSIS — I5032 Chronic diastolic (congestive) heart failure: Secondary | ICD-10-CM

## 2014-01-01 DIAGNOSIS — I5040 Unspecified combined systolic (congestive) and diastolic (congestive) heart failure: Secondary | ICD-10-CM

## 2014-01-01 DIAGNOSIS — K219 Gastro-esophageal reflux disease without esophagitis: Secondary | ICD-10-CM

## 2014-01-01 LAB — CBC WITH DIFFERENTIAL/PLATELET
BASO%: 0.2 % (ref 0.0–2.0)
Basophils Absolute: 0 10e3/uL (ref 0.0–0.1)
EOS%: 3.3 % (ref 0.0–7.0)
Eosinophils Absolute: 0.2 10e3/uL (ref 0.0–0.5)
HCT: 33.8 % — ABNORMAL LOW (ref 34.8–46.6)
HGB: 11.1 g/dL — ABNORMAL LOW (ref 11.6–15.9)
LYMPH%: 30.4 % (ref 14.0–49.7)
MCH: 35 pg — ABNORMAL HIGH (ref 25.1–34.0)
MCHC: 32.8 g/dL (ref 31.5–36.0)
MCV: 106.6 fL — ABNORMAL HIGH (ref 79.5–101.0)
MONO#: 0.6 10e3/uL (ref 0.1–0.9)
MONO%: 12.2 % (ref 0.0–14.0)
NEUT#: 2.8 10e3/uL (ref 1.5–6.5)
NEUT%: 53.9 % (ref 38.4–76.8)
Platelets: 76 10e3/uL — ABNORMAL LOW (ref 145–400)
RBC: 3.17 10e6/uL — ABNORMAL LOW (ref 3.70–5.45)
RDW: 14 % (ref 11.2–14.5)
WBC: 5.2 10e3/uL (ref 3.9–10.3)
lymph#: 1.6 10e3/uL (ref 0.9–3.3)
nRBC: 0 % (ref 0–0)

## 2014-01-01 MED ORDER — DARBEPOETIN ALFA-POLYSORBATE 300 MCG/0.6ML IJ SOLN: 300.0000 ug | Freq: Once | INTRAMUSCULAR | Status: DC

## 2014-01-02 ENCOUNTER — Ambulatory Visit (HOSPITAL_COMMUNITY): Payer: Medicare HMO | Attending: Cardiovascular Disease | Admitting: Cardiology

## 2014-01-02 DIAGNOSIS — I6529 Occlusion and stenosis of unspecified carotid artery: Secondary | ICD-10-CM | POA: Insufficient documentation

## 2014-01-02 NOTE — Progress Notes (Signed)
Carotid duplex complete 

## 2014-01-07 ENCOUNTER — Other Ambulatory Visit: Payer: Self-pay | Admitting: Cardiology

## 2014-01-09 ENCOUNTER — Encounter: Payer: Self-pay | Admitting: *Deleted

## 2014-01-16 ENCOUNTER — Ambulatory Visit: Payer: Medicare HMO | Admitting: Pharmacist

## 2014-01-17 ENCOUNTER — Encounter: Payer: Self-pay | Admitting: Neurology

## 2014-01-17 ENCOUNTER — Ambulatory Visit (INDEPENDENT_AMBULATORY_CARE_PROVIDER_SITE_OTHER): Payer: Medicare HMO | Admitting: Neurology

## 2014-01-17 VITALS — BP 110/58 | HR 68 | Temp 97.5°F | Resp 16 | Ht 68.0 in | Wt 143.0 lb

## 2014-01-17 DIAGNOSIS — G609 Hereditary and idiopathic neuropathy, unspecified: Secondary | ICD-10-CM

## 2014-01-17 DIAGNOSIS — R29818 Other symptoms and signs involving the nervous system: Secondary | ICD-10-CM

## 2014-01-17 DIAGNOSIS — R2689 Other abnormalities of gait and mobility: Secondary | ICD-10-CM

## 2014-01-17 LAB — TSH: TSH: 2.185 u[IU]/mL (ref 0.350–4.500)

## 2014-01-17 LAB — SEDIMENTATION RATE: SED RATE: 42 mm/h — AB (ref 0–22)

## 2014-01-17 LAB — VITAMIN B12: VITAMIN B 12: 1194 pg/mL — AB (ref 211–911)

## 2014-01-17 NOTE — Patient Instructions (Addendum)
I think the balance problem is due to the nerves in the feet.  Less likely, it may be due to the neck.  I don't suspect it is due to something in the brain. 1.  We will get CT of the cervical spine (neck) 01/21/14 at 12:15 pm  [address] [place] if you need to change appt you can call [phone] 2.  We will check blood work for other causes of neuropathy, such as ANA, Sed Rate, B12, TSH, SPEP/IFE 3.  We will get you set up with physical .  In the meantime, use the cane. 4.  Follow up in 6 months.

## 2014-01-17 NOTE — Progress Notes (Signed)
NEUROLOGY CONSULTATION NOTE  [name] MRN: [id number] DOB: [birth year]  Referring provider: Dr. Aundra Dubin Primary care provider: Dr. Redmond Pulling  Reason for consult:  Balance problems  HISTORY OF PRESENT ILLNESS: [name] is a 73 year old right-handed woman with CAD status post CABG, hypertension, diastolic CHF, type II diabetes mellitus, hyperlipidemia, chronic kidney disease, carotid artery disease, chronic low back pain, aortic stenosis, thrombocytopenia and PAD who presents for balance problems.  She is accompanied by her husband. Records personally reviewed.  She began having balance problems about 2 years ago. It has been unchanged over the past 2 years. She says she feels unsteady on her feet. Sometimes when she is walking, she will note a "shifting motion "which causes her to lean to any direction, mostly to the sides and possibly to the right more than the left. It's more of a sensation in the head. However this is not feeling of dizziness or lightheadedness or spinning sensation. There is no associated headache. If she feels herself tipping, she will often catch herself but shuffling her feet chronically. She has not had any falls in 2 years. She denies any numbness or weakness in the legs. She denies any neck pain. She denies any back pain. She denies any new bowel or bladder dysfunction over the last 2 years. She denies any pain numbness or weakness in the arms. She does note that if she squats, she will have a hard time getting up. These episodes do not occur while sitting. She feels fine and secure inside the house, because she can hold on to the wall or other furniture. However she feels anxious when she is outside or in an open space. When she is outside, she either uses a 4 pronged cane or hold onto her husband. Over the past couple of years, she also notes a throbbing pain in burning on the heels of her feet, right worse than left. She notices this when she is laying in bed. It is  been a gradual progression. She has long-standing history of diabetes which has caused problems with her vision and her kidneys. However, she has been well controlled for many years and was recently taken off of metformin.  01/01/14:  WBC 5.2, HGB 11.1, HCT 33.8, PLT 76, MCV 106.6 12/20/13:  LDL 29, Na 138, K 4.6, Cl 105, bicarb 24, glucose 128, BUN 29, Cr 2.4, Ca 10.1, GFR 21.61 07/18/12:  Hgb A1c 5.8 05/23/06 MRI LUMBAR SPINE W/WO:  central disc protrusion at L4-5, large recurrent disc protrusion on left at L5-S1 with compression of the left S1 nerve root and some mass effect on thecal sac.  PAST MEDICAL HISTORY: Past Medical History  Diagnosis Date  . Hypertension   . Hyperlipidemia   . Diabetes mellitus   . Coronary artery disease     Pt presented 2/10 to [facility] with NSTEMI and diastolic CHF exacerbation.  LHC was done  3/10 showing 99% pRCA stenosis and 80% calcified pLAD stenosis with L=>R collaterals.  Pt was referred  for CABG which was done by Dr. Prescott Gum with LIMA-LAD, SVG-RCA, SVG-OM.  [name] CKD (chronic kidney disease)      Last creatinine 1.4.  . Anxiety   . Chronic low back pain   . Anemia     Pt is taking iron.   . Mild aortic stenosis     mean gradient 12 mmHg in 2/12.  . Thrombocytopenia   . Carotid stenosis     40-59% bilateral ICA stenosis  in 2/12.  . Diastolic CHF     Echo ([id number]) showed EF 55-65%, mild LVH, diastolic dysfunction, mild AS with mean gradient 12 mmHg, PASP 43 mmHg.  Echo (2/12): EF 55-60%, mild LVH, mild AS (mean gradient 12), PA systolic pressure 32 mmHg.       PAST SURGICAL HISTORY: Past Surgical History  Procedure Laterality Date  . Coronary artery bypass graft  09/2008    pt with NSTEMI and diastolic CHF exacerbation.  LHC was done  3/10 showing 99% pRCA stenosis and 80% calcified pLAD stenosis with L=>R collaterals.  Pt was referred  for CABG which was done by Dr. Prescott Gum with LIMA-LAD, SVG-RCA, SVG-OM.  [name] Back surgery      multiple     MEDICATIONS: Current Outpatient Prescriptions on File Prior to Visit  Medication Sig Dispense Refill  . ALPRAZolam (XANAX) 0.5 MG tablet Take 0.5 mg by mouth daily.      [name] amLODipine (NORVASC) 5 MG tablet Take 1 tablet (5 mg total) by mouth daily.  30 tablet  11  . aspirin 81 MG tablet Take 81 mg by mouth daily.       . carvedilol (COREG) 12.5 MG tablet TAKE 1 TABLET BY MOUTH TWICE A DAY  60 tablet  5  . CRESTOR 5 MG tablet TAKE 1 TABLET DAILY  90 tablet  3  . cyanocobalamin 1000 MCG tablet Take 100 mcg by mouth daily.      [name] losartan (COZAAR) 50 MG tablet Take 50 mg by mouth daily. Patient unsure of dosage.      . Multiple Vitamin (MULTIVITAMIN) capsule Take 1 capsule by mouth daily.      . niacin (NIASPAN) 1000 MG CR tablet Take 1,000 mg by mouth at bedtime.       [name] omeprazole (PRILOSEC) 20 MG capsule Take 20 mg by mouth daily. As needed      . timolol (BETIMOL) 0.5 % ophthalmic solution 1 drop 2 (two) times daily.      . metFORMIN (GLUCOPHAGE) 1000 MG tablet Take 500 mg by mouth 2 (two) times daily with a meal.       . [DISCONTINUED] metoprolol succinate (TOPROL-XL) 50 MG 24 hr tablet Take 50 mg by mouth 2 (two) times daily. Take with or immediately following a meal.       No current facility-administered medications on file prior to visit.    ALLERGIES: No Known Allergies  FAMILY HISTORY: Family History  Problem Relation Age of Onset  . Adopted: Yes    SOCIAL HISTORY: History   Social History  . Marital Status: Married    Spouse Name: N/A    Number of Children: N/A  . Years of Education: N/A   Occupational History  . Not on file.   Social History Main Topics  . Smoking status: Former Smoker    Types: Cigarettes  . Smokeless tobacco: Former Systems developer    Quit date: 08/15/1988     Comment: quit 1988  . Alcohol Use: Yes     Comment: beer  . Drug Use: No  . Sexual Activity: No   Other Topics Concern  . Not on file   Social History Narrative  . No narrative on  file    REVIEW OF SYSTEMS: Constitutional: No fevers, chills, or sweats, no generalized fatigue, change in appetite Eyes: No visual changes, double vision, eye pain Ear, nose and throat: No hearing loss, ear pain, nasal congestion, sore throat Cardiovascular: No chest pain, palpitations Respiratory:  No shortness of breath at rest or with exertion, wheezes GastrointestinaI: No nausea, vomiting, diarrhea, abdominal pain, fecal incontinence Genitourinary:  No dysuria, urinary retention or frequency Musculoskeletal:  No neck pain, back pain Integumentary: No rash, pruritus, skin lesions Neurological: as above Psychiatric: No depression, insomnia, anxiety Endocrine: No palpitations, fatigue, diaphoresis, mood swings, change in appetite, change in weight, increased thirst Hematologic/Lymphatic:  No anemia, purpura, petechiae. Allergic/Immunologic: no itchy/runny eyes, nasal congestion, recent allergic reactions, rashes  PHYSICAL EXAM: Filed Vitals:   01/17/14 0915  BP: 110/58  Pulse: 68  Temp: 97.5 F (36.4 C)  Resp: 16   General: No acute distress Head:  Normocephalic/atraumatic Neck: supple, no paraspinal tenderness, full range of motion Back: No paraspinal tenderness Heart: regular rate and rhythm Lungs: Clear to auscultation bilaterally. Vascular: No carotid bruits. Neurological Exam: Mental status: alert and oriented to person, place, and time, recent and remote memory intact, fund of knowledge intact, attention and concentration intact, speech fluent and not dysarthric, language intact. Cranial nerves: CN I: not tested CN II: pupils equal, round and reactive to light, visual fields intact, fundi unremarkable, without vessel changes, exudates, hemorrhages or papilledema. CN III, IV, VI:  full range of motion, no nystagmus, no ptosis CN V: facial sensation intact CN VII: upper and lower face symmetric CN VIII: hearing intact CN IX, X: gag intact, uvula midline CN XI:  sternocleidomastoid and trapezius muscles intact CN XII: tongue midline Bulk & Tone: normal, no fasciculations. Motor: 5/5 throughout Sensation: reduced pinprick in the feet up to the ankles.  Mild reduced vibration sensation. Deep Tendon Reflexes: 2+ in upper extremities and symmetric, 2+ in patellars, absent in ankles.  Toes downgoing.  Hoffman sign absent. Finger to nose testing: no dysmetria Gait: wide-based gait,with short stride. Romberg with mild sway.  IMPRESSION: Gait instability.  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  Given that the balance and foot pain has started about 2 years ago, it is less likely diabetes since her diabetes has been well-controlled.  Other consideration would be cervical stenosis.   PLAN: 1.  Check CT cervical spine without contrast to look for evidence of stenosis 2.  Check for alternative causes of neuropathy:  ANA, Sed Rate, B12, TSH, SPEP/IFE 3.  Refer to PT 4.  Follow up in 6 weeks.  45 minutes spent with patient, over 50% spent counseling and coordinating care.  Thank you for allowing me to take part in the care of this patient.  Metta Clines, DO  CC:  Loralie Champagne, MD  Kathryne Eriksson, MD

## 2014-01-18 ENCOUNTER — Other Ambulatory Visit (HOSPITAL_COMMUNITY): Payer: Self-pay | Admitting: Cardiology

## 2014-01-20 LAB — ANA: Anti Nuclear Antibody(ANA): NEGATIVE

## 2014-01-21 ENCOUNTER — Other Ambulatory Visit: Payer: Medicare HMO

## 2014-01-21 LAB — SPEP & IFE WITH QIG
ALBUMIN ELP: 61.6 % (ref 55.8–66.1)
ALPHA-2-GLOBULIN: 13.8 % — AB (ref 7.1–11.8)
Alpha-1-Globulin: 5.8 % — ABNORMAL HIGH (ref 2.9–4.9)
Beta 2: 3.3 % (ref 3.2–6.5)
Beta Globulin: 5.8 % (ref 4.7–7.2)
Gamma Globulin: 9.7 % — ABNORMAL LOW (ref 11.1–18.8)
IgA: 91 mg/dL (ref 69–380)
IgG (Immunoglobin G), Serum: 520 mg/dL — ABNORMAL LOW (ref 690–1700)
IgM, Serum: 22 mg/dL — ABNORMAL LOW (ref 52–322)
Total Protein, Serum Electrophoresis: 5.8 g/dL — ABNORMAL LOW (ref 6.0–8.3)

## 2014-01-22 ENCOUNTER — Other Ambulatory Visit (HOSPITAL_BASED_OUTPATIENT_CLINIC_OR_DEPARTMENT_OTHER): Payer: Medicare HMO

## 2014-01-22 ENCOUNTER — Ambulatory Visit (HOSPITAL_BASED_OUTPATIENT_CLINIC_OR_DEPARTMENT_OTHER): Payer: Medicare HMO

## 2014-01-22 ENCOUNTER — Telehealth: Payer: Self-pay | Admitting: *Deleted

## 2014-01-22 VITALS — BP 135/51 | HR 61 | Temp 97.6°F

## 2014-01-22 DIAGNOSIS — I2510 Atherosclerotic heart disease of native coronary artery without angina pectoris: Secondary | ICD-10-CM

## 2014-01-22 DIAGNOSIS — M545 Low back pain, unspecified: Secondary | ICD-10-CM

## 2014-01-22 DIAGNOSIS — N189 Chronic kidney disease, unspecified: Secondary | ICD-10-CM

## 2014-01-22 DIAGNOSIS — I10 Essential (primary) hypertension: Secondary | ICD-10-CM

## 2014-01-22 DIAGNOSIS — F411 Generalized anxiety disorder: Secondary | ICD-10-CM

## 2014-01-22 DIAGNOSIS — D638 Anemia in other chronic diseases classified elsewhere: Secondary | ICD-10-CM

## 2014-01-22 DIAGNOSIS — I359 Nonrheumatic aortic valve disorder, unspecified: Secondary | ICD-10-CM

## 2014-01-22 DIAGNOSIS — I5032 Chronic diastolic (congestive) heart failure: Secondary | ICD-10-CM

## 2014-01-22 DIAGNOSIS — E118 Type 2 diabetes mellitus with unspecified complications: Secondary | ICD-10-CM

## 2014-01-22 DIAGNOSIS — D696 Thrombocytopenia, unspecified: Secondary | ICD-10-CM

## 2014-01-22 DIAGNOSIS — I739 Peripheral vascular disease, unspecified: Secondary | ICD-10-CM

## 2014-01-22 DIAGNOSIS — K219 Gastro-esophageal reflux disease without esophagitis: Secondary | ICD-10-CM

## 2014-01-22 DIAGNOSIS — N289 Disorder of kidney and ureter, unspecified: Secondary | ICD-10-CM

## 2014-01-22 DIAGNOSIS — D649 Anemia, unspecified: Secondary | ICD-10-CM

## 2014-01-22 DIAGNOSIS — I6529 Occlusion and stenosis of unspecified carotid artery: Secondary | ICD-10-CM

## 2014-01-22 DIAGNOSIS — G4700 Insomnia, unspecified: Secondary | ICD-10-CM

## 2014-01-22 DIAGNOSIS — I5040 Unspecified combined systolic (congestive) and diastolic (congestive) heart failure: Secondary | ICD-10-CM

## 2014-01-22 DIAGNOSIS — E785 Hyperlipidemia, unspecified: Secondary | ICD-10-CM

## 2014-01-22 LAB — CBC WITH DIFFERENTIAL/PLATELET
BASO%: 0.2 % (ref 0.0–2.0)
Basophils Absolute: 0 10*3/uL (ref 0.0–0.1)
EOS%: 3.8 % (ref 0.0–7.0)
Eosinophils Absolute: 0.2 10*3/uL (ref 0.0–0.5)
HEMATOCRIT: 30.5 % — AB (ref 34.8–46.6)
HEMOGLOBIN: 10.1 g/dL — AB (ref 11.6–15.9)
LYMPH#: 1.7 10*3/uL (ref 0.9–3.3)
LYMPH%: 28.4 % (ref 14.0–49.7)
MCH: 34.2 pg — AB (ref 25.1–34.0)
MCHC: 33.1 g/dL (ref 31.5–36.0)
MCV: 103.4 fL — ABNORMAL HIGH (ref 79.5–101.0)
MONO#: 0.7 10*3/uL (ref 0.1–0.9)
MONO%: 11.6 % (ref 0.0–14.0)
NEUT#: 3.4 10*3/uL (ref 1.5–6.5)
NEUT%: 56 % (ref 38.4–76.8)
PLATELETS: 78 10*3/uL — AB (ref 145–400)
RBC: 2.95 10*6/uL — ABNORMAL LOW (ref 3.70–5.45)
RDW: 12.8 % (ref 11.2–14.5)
WBC: 6 10*3/uL (ref 3.9–10.3)
nRBC: 0 % (ref 0–0)

## 2014-01-22 MED ORDER — DARBEPOETIN ALFA-POLYSORBATE 300 MCG/0.6ML IJ SOLN
300.0000 ug | Freq: Once | INTRAMUSCULAR | Status: AC
  Administered 2014-01-22: 300 ug via SUBCUTANEOUS
  Filled 2014-01-22: qty 0.6

## 2014-01-22 NOTE — Telephone Encounter (Signed)
Note this patient is unable to have MRI due to past heart surgery and metal wires   in chest

## 2014-01-23 ENCOUNTER — Other Ambulatory Visit: Payer: Medicare HMO

## 2014-01-23 ENCOUNTER — Telehealth: Payer: Self-pay | Admitting: Neurology

## 2014-01-23 NOTE — Telephone Encounter (Signed)
I called [name] with results of the blood work.  While the lab results did not reveal any specific potential cause for neuropathy, there were a couple of nonspecific findings.  The Sed Rate was 42, however the ANA is negative.  Also, the IFE revealed area of slightly restricted mobility in the IgG and Lambda lanes.  Recommend repeating the SPEP/IFE in 6 months.

## 2014-02-12 ENCOUNTER — Telehealth: Payer: Self-pay | Admitting: Oncology

## 2014-02-12 ENCOUNTER — Ambulatory Visit: Payer: Medicare HMO

## 2014-02-12 ENCOUNTER — Other Ambulatory Visit (HOSPITAL_BASED_OUTPATIENT_CLINIC_OR_DEPARTMENT_OTHER): Payer: Medicare HMO

## 2014-02-12 DIAGNOSIS — D649 Anemia, unspecified: Secondary | ICD-10-CM

## 2014-02-12 DIAGNOSIS — D696 Thrombocytopenia, unspecified: Secondary | ICD-10-CM

## 2014-02-12 DIAGNOSIS — G4700 Insomnia, unspecified: Secondary | ICD-10-CM

## 2014-02-12 DIAGNOSIS — N189 Chronic kidney disease, unspecified: Secondary | ICD-10-CM

## 2014-02-12 LAB — CBC WITH DIFFERENTIAL/PLATELET
BASO%: 0.2 % (ref 0.0–2.0)
BASOS ABS: 0 10*3/uL (ref 0.0–0.1)
EOS%: 4.6 % (ref 0.0–7.0)
Eosinophils Absolute: 0.2 10*3/uL (ref 0.0–0.5)
HCT: 34.6 % — ABNORMAL LOW (ref 34.8–46.6)
HGB: 11.1 g/dL — ABNORMAL LOW (ref 11.6–15.9)
LYMPH%: 34 % (ref 14.0–49.7)
MCH: 34.5 pg — ABNORMAL HIGH (ref 25.1–34.0)
MCHC: 32.1 g/dL (ref 31.5–36.0)
MCV: 107.5 fL — AB (ref 79.5–101.0)
MONO#: 0.5 10*3/uL (ref 0.1–0.9)
MONO%: 11 % (ref 0.0–14.0)
NEUT#: 2.1 10*3/uL (ref 1.5–6.5)
NEUT%: 50.2 % (ref 38.4–76.8)
PLATELETS: 59 10*3/uL — AB (ref 145–400)
RBC: 3.22 10*6/uL — AB (ref 3.70–5.45)
RDW: 14.1 % (ref 11.2–14.5)
WBC: 4.1 10*3/uL (ref 3.9–10.3)
lymph#: 1.4 10*3/uL (ref 0.9–3.3)

## 2014-02-12 MED ORDER — DARBEPOETIN ALFA-POLYSORBATE 300 MCG/0.6ML IJ SOLN: 300.0000 ug | Freq: Once | INTRAMUSCULAR | Status: DC

## 2014-02-12 NOTE — Telephone Encounter (Signed)
MEDICAL RECORDS MAILED TO [facility] [id number], [place] [address]

## 2014-02-20 ENCOUNTER — Telehealth: Payer: Self-pay | Admitting: Neurology

## 2014-02-20 NOTE — Telephone Encounter (Signed)
Pt called wanting to f/u on the referral for physical therapy Dr. Tomi Likens wanted her to have. Please cal pt confirm. C/B [phone]

## 2014-02-28 ENCOUNTER — Ambulatory Visit: Payer: Medicare HMO | Admitting: Neurology

## 2014-02-28 ENCOUNTER — Telehealth: Payer: Self-pay | Admitting: Neurology

## 2014-02-28 NOTE — Telephone Encounter (Signed)
Pt called to cancel her 02/28/14 appt. She stated that she does not feel like she needs to come in and be seen.

## 2014-03-03 ENCOUNTER — Telehealth: Payer: Self-pay | Admitting: Oncology

## 2014-03-03 NOTE — Telephone Encounter (Signed)
moved 7/22 appt from [place] to [place] over capacity - s/w pt she is aware.

## 2014-03-05 ENCOUNTER — Telehealth: Payer: Self-pay | Admitting: Oncology

## 2014-03-05 ENCOUNTER — Other Ambulatory Visit (HOSPITAL_BASED_OUTPATIENT_CLINIC_OR_DEPARTMENT_OTHER): Payer: Medicare HMO

## 2014-03-05 ENCOUNTER — Ambulatory Visit (HOSPITAL_BASED_OUTPATIENT_CLINIC_OR_DEPARTMENT_OTHER): Payer: Medicare HMO | Admitting: Oncology

## 2014-03-05 ENCOUNTER — Ambulatory Visit: Payer: Medicare HMO

## 2014-03-05 ENCOUNTER — Encounter: Payer: Self-pay | Admitting: Oncology

## 2014-03-05 VITALS — BP 124/66 | HR 70 | Temp 97.1°F | Resp 18 | Ht 68.0 in | Wt 138.8 lb

## 2014-03-05 DIAGNOSIS — D649 Anemia, unspecified: Secondary | ICD-10-CM

## 2014-03-05 DIAGNOSIS — N189 Chronic kidney disease, unspecified: Secondary | ICD-10-CM

## 2014-03-05 DIAGNOSIS — F411 Generalized anxiety disorder: Secondary | ICD-10-CM

## 2014-03-05 DIAGNOSIS — I5032 Chronic diastolic (congestive) heart failure: Secondary | ICD-10-CM

## 2014-03-05 DIAGNOSIS — I10 Essential (primary) hypertension: Secondary | ICD-10-CM

## 2014-03-05 DIAGNOSIS — D696 Thrombocytopenia, unspecified: Secondary | ICD-10-CM

## 2014-03-05 DIAGNOSIS — I2510 Atherosclerotic heart disease of native coronary artery without angina pectoris: Secondary | ICD-10-CM

## 2014-03-05 DIAGNOSIS — E785 Hyperlipidemia, unspecified: Secondary | ICD-10-CM

## 2014-03-05 DIAGNOSIS — E118 Type 2 diabetes mellitus with unspecified complications: Secondary | ICD-10-CM

## 2014-03-05 DIAGNOSIS — I5040 Unspecified combined systolic (congestive) and diastolic (congestive) heart failure: Secondary | ICD-10-CM

## 2014-03-05 DIAGNOSIS — G4700 Insomnia, unspecified: Secondary | ICD-10-CM

## 2014-03-05 DIAGNOSIS — I6529 Occlusion and stenosis of unspecified carotid artery: Secondary | ICD-10-CM

## 2014-03-05 DIAGNOSIS — I739 Peripheral vascular disease, unspecified: Secondary | ICD-10-CM

## 2014-03-05 DIAGNOSIS — N289 Disorder of kidney and ureter, unspecified: Secondary | ICD-10-CM

## 2014-03-05 DIAGNOSIS — I359 Nonrheumatic aortic valve disorder, unspecified: Secondary | ICD-10-CM

## 2014-03-05 LAB — CBC WITH DIFFERENTIAL/PLATELET
BASO%: 0.6 % (ref 0.0–2.0)
BASOS ABS: 0 10*3/uL (ref 0.0–0.1)
EOS%: 2.3 % (ref 0.0–7.0)
Eosinophils Absolute: 0.2 10*3/uL (ref 0.0–0.5)
HEMATOCRIT: 31 % — AB (ref 34.8–46.6)
HGB: 9.8 g/dL — ABNORMAL LOW (ref 11.6–15.9)
LYMPH#: 1.4 10*3/uL (ref 0.9–3.3)
LYMPH%: 16.9 % (ref 14.0–49.7)
MCH: 34.2 pg — ABNORMAL HIGH (ref 25.1–34.0)
MCHC: 31.7 g/dL (ref 31.5–36.0)
MCV: 108.2 fL — ABNORMAL HIGH (ref 79.5–101.0)
MONO#: 0.9 10*3/uL (ref 0.1–0.9)
MONO%: 11.4 % (ref 0.0–14.0)
NEUT#: 5.7 10*3/uL (ref 1.5–6.5)
NEUT%: 68.8 % (ref 38.4–76.8)
Platelets: 80 10*3/uL — ABNORMAL LOW (ref 145–400)
RBC: 2.86 10*6/uL — ABNORMAL LOW (ref 3.70–5.45)
RDW: 12.6 % (ref 11.2–14.5)
WBC: 8.2 10*3/uL (ref 3.9–10.3)

## 2014-03-05 MED ORDER — DARBEPOETIN ALFA-POLYSORBATE 300 MCG/0.6ML IJ SOLN
300.0000 ug | Freq: Once | INTRAMUSCULAR | Status: AC
  Administered 2014-03-05: 300 ug via SUBCUTANEOUS
  Filled 2014-03-05: qty 0.6

## 2014-03-05 NOTE — Progress Notes (Signed)
Hematology and Oncology Follow Up Visit  [name] [id number] 1941-07-26 73 y.o. 03/05/2014 10:44 AM [name], MDWilson, Jeanne Flavors, MD   Principle Diagnosis: This is a 73 year old female with multifactorial anemia. She has anemia of chronic disease, anemia of renal disease, diagnosed in 2011.  Current therapy: Aranesp 300 mcg subcutaneously every 3 weeks keeping hemoglobin above 11.  Interim History:  [name] presents today for a followup visit with her husband. This is a very pleasant woman with renal insufficiency and chronic anemia. She has been on Aranesp and have helped her overall quality of life and improvement in her performance status. She had reported some increase in her energy, increase in her appetite. She denies any recent falls but requires assistance from her husband for certain activities of daily living. She has not had any problems associated with Aranesp. She had not had any injection or infusion problem and denied any toxicities. She has not noticed any bleeding or thrombosis. She reports no recent illnesses or hospitalizations. She has not reported any headaches or blurry vision or double vision. She does not report any chest pain shortness of breath or difficulty breathing. She does not report any nausea or vomiting or abdominal pain. She does not report any genitourinary complaints. She has not reported any bleeding or clotting tendencies. She has not reported any lymphadenopathy or petechiae.  Medications: I have reviewed the patient's current medications.  Current Outpatient Prescriptions  Medication Sig Dispense Refill  . ALPRAZolam (XANAX) 0.5 MG tablet Take 0.5 mg by mouth daily.      [name] amLODipine (NORVASC) 5 MG tablet Take 1 tablet (5 mg total) by mouth daily.  30 tablet  11  . aspirin 81 MG tablet Take 81 mg by mouth daily.       . carvedilol (COREG) 12.5 MG tablet TAKE 1 TABLET BY MOUTH TWICE A DAY  60 tablet  5  . CRESTOR 5 MG tablet TAKE 1 TABLET BY  MOUTH EVERY DAY  90 tablet  1  . cyanocobalamin 1000 MCG tablet Take 100 mcg by mouth daily.      [name] losartan (COZAAR) 50 MG tablet Take 50 mg by mouth daily. Patient unsure of dosage.      . metFORMIN (GLUCOPHAGE) 1000 MG tablet Take 500 mg by mouth 2 (two) times daily with a meal.       . Multiple Vitamin (MULTIVITAMIN) capsule Take 1 capsule by mouth daily.      . niacin (NIASPAN) 1000 MG CR tablet Take 1,000 mg by mouth at bedtime.       [name] omeprazole (PRILOSEC) 20 MG capsule Take 20 mg by mouth daily. As needed      . timolol (BETIMOL) 0.5 % ophthalmic solution 1 drop 2 (two) times daily.      . [DISCONTINUED] metoprolol succinate (TOPROL-XL) 50 MG 24 hr tablet Take 50 mg by mouth 2 (two) times daily. Take with or immediately following a meal.       No current facility-administered medications for this visit.     Allergies: No Known Allergies  Past Medical History, Surgical history, Social history, and Family History were reviewed and updated.   Remaining ROS negative.  Physical Exam: Blood pressure 124/66, pulse 70, temperature 97.1 F (36.2 C), temperature source Oral, resp. rate 18, height 5\' 8"  (1.727 m), weight 138 lb 12.8 oz (62.959 kg). ECOG: 1 General appearance: alert, cooperative and no distress Head: Normocephalic, without obvious abnormality, atraumatic Neck: no adenopathy, no carotid bruit,  no JVD, supple, symmetrical, trachea midline and thyroid not enlarged, symmetric, no tenderness/mass/nodules Lymph nodes: Cervical, supraclavicular, and axillary nodes normal. Heart:regular rate and rhythm, S1, S2 normal, no murmur, click, rub or gallop Lung:chest clear, no wheezing, rales, normal symmetric air entry, no tachypnea, retractions or cyanosis Abdomen: soft, non-tender, without masses or organomegaly EXT:no erythema, induration, or nodules   Lab Results: Lab Results  Component Value Date   WBC 8.2 03/05/2014   HGB 9.8* 03/05/2014   HCT 31.0* 03/05/2014   MCV 108.2*  03/05/2014   PLT 80* 03/05/2014     Chemistry      Component Value Date/Time   NA 138 12/20/2013 1103   NA 135* 08/16/2012 0757   K 4.6 12/20/2013 1103   K 4.6 08/16/2012 0757   CL 105 12/20/2013 1103   CL 101 08/16/2012 0757   CO2 24 12/20/2013 1103   CO2 21* 08/16/2012 0757   BUN 29* 12/20/2013 1103   BUN 25.0 08/16/2012 0757   CREATININE 2.4* 12/20/2013 1103   CREATININE 2.1* 08/16/2012 0757      Component Value Date/Time   CALCIUM 10.1 12/20/2013 1103   CALCIUM 9.2 08/16/2012 0757   ALKPHOS 91 08/16/2012 0757   ALKPHOS 75 12/26/2011 0933   AST 21 08/16/2012 0757   AST 23 12/26/2011 0933   ALT 10 08/16/2012 0757   ALT 14 12/26/2011 0933   BILITOT 0.50 08/16/2012 0757   BILITOT 0.6 12/26/2011 0933     Impression and Plan:   73 year old female with the following issues:   1. 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 We will keep her on Aranesp, keep her hemoglobin above 11. Hemoglobin is 9.8. Aranesp will be given today.   2. Thrombocytopenia. Her platelet count is stable. No major bleeding at this time. However, we will continue to monitor her blood counts closely. The likelihood of possibly developing myelodysplastic syndrome is always a possibility.   3. Follow-up: CBC and Aranesp injection every 3 weeks. Visit in 4 months.       [place], [place] 7/22/201510:44 AM

## 2014-03-05 NOTE — Telephone Encounter (Signed)
Pt confirmed labs/ov/injections per 07/22 POF, gave pt AVS and mailed finished updated schedule...Marland KitchenMarland KitchenKJ

## 2014-03-26 ENCOUNTER — Other Ambulatory Visit (HOSPITAL_BASED_OUTPATIENT_CLINIC_OR_DEPARTMENT_OTHER): Payer: Medicare HMO

## 2014-03-26 ENCOUNTER — Ambulatory Visit (HOSPITAL_BASED_OUTPATIENT_CLINIC_OR_DEPARTMENT_OTHER): Payer: Medicare HMO

## 2014-03-26 VITALS — BP 140/48 | HR 57 | Temp 97.5°F

## 2014-03-26 DIAGNOSIS — N189 Chronic kidney disease, unspecified: Secondary | ICD-10-CM

## 2014-03-26 DIAGNOSIS — F411 Generalized anxiety disorder: Secondary | ICD-10-CM

## 2014-03-26 DIAGNOSIS — K219 Gastro-esophageal reflux disease without esophagitis: Secondary | ICD-10-CM

## 2014-03-26 DIAGNOSIS — D649 Anemia, unspecified: Secondary | ICD-10-CM

## 2014-03-26 DIAGNOSIS — E118 Type 2 diabetes mellitus with unspecified complications: Secondary | ICD-10-CM

## 2014-03-26 DIAGNOSIS — D696 Thrombocytopenia, unspecified: Secondary | ICD-10-CM

## 2014-03-26 DIAGNOSIS — N289 Disorder of kidney and ureter, unspecified: Secondary | ICD-10-CM

## 2014-03-26 DIAGNOSIS — E785 Hyperlipidemia, unspecified: Secondary | ICD-10-CM

## 2014-03-26 DIAGNOSIS — M545 Low back pain, unspecified: Secondary | ICD-10-CM

## 2014-03-26 DIAGNOSIS — I5032 Chronic diastolic (congestive) heart failure: Secondary | ICD-10-CM

## 2014-03-26 DIAGNOSIS — G4700 Insomnia, unspecified: Secondary | ICD-10-CM

## 2014-03-26 DIAGNOSIS — I2510 Atherosclerotic heart disease of native coronary artery without angina pectoris: Secondary | ICD-10-CM

## 2014-03-26 DIAGNOSIS — I10 Essential (primary) hypertension: Secondary | ICD-10-CM

## 2014-03-26 DIAGNOSIS — I739 Peripheral vascular disease, unspecified: Secondary | ICD-10-CM

## 2014-03-26 DIAGNOSIS — I6529 Occlusion and stenosis of unspecified carotid artery: Secondary | ICD-10-CM

## 2014-03-26 DIAGNOSIS — I5040 Unspecified combined systolic (congestive) and diastolic (congestive) heart failure: Secondary | ICD-10-CM

## 2014-03-26 DIAGNOSIS — I359 Nonrheumatic aortic valve disorder, unspecified: Secondary | ICD-10-CM

## 2014-03-26 LAB — CBC WITH DIFFERENTIAL/PLATELET
BASO%: 0.5 % (ref 0.0–2.0)
BASOS ABS: 0 10*3/uL (ref 0.0–0.1)
EOS ABS: 0.1 10*3/uL (ref 0.0–0.5)
EOS%: 3.2 % (ref 0.0–7.0)
HCT: 31.4 % — ABNORMAL LOW (ref 34.8–46.6)
HEMOGLOBIN: 9.9 g/dL — AB (ref 11.6–15.9)
LYMPH#: 1.3 10*3/uL (ref 0.9–3.3)
LYMPH%: 33.4 % (ref 14.0–49.7)
MCH: 35 pg — AB (ref 25.1–34.0)
MCHC: 31.5 g/dL (ref 31.5–36.0)
MCV: 111 fL — ABNORMAL HIGH (ref 79.5–101.0)
MONO#: 0.5 10*3/uL (ref 0.1–0.9)
MONO%: 13 % (ref 0.0–14.0)
NEUT#: 2 10*3/uL (ref 1.5–6.5)
NEUT%: 49.9 % (ref 38.4–76.8)
Platelets: 51 10*3/uL — ABNORMAL LOW (ref 145–400)
RBC: 2.83 10*6/uL — ABNORMAL LOW (ref 3.70–5.45)
RDW: 15.4 % — AB (ref 11.2–14.5)
WBC: 4 10*3/uL (ref 3.9–10.3)

## 2014-03-26 MED ORDER — DARBEPOETIN ALFA-POLYSORBATE 300 MCG/0.6ML IJ SOLN
300.0000 ug | Freq: Once | INTRAMUSCULAR | Status: AC
  Administered 2014-03-26: 300 ug via SUBCUTANEOUS
  Filled 2014-03-26: qty 0.6

## 2014-04-16 ENCOUNTER — Other Ambulatory Visit (HOSPITAL_BASED_OUTPATIENT_CLINIC_OR_DEPARTMENT_OTHER): Payer: Medicare HMO

## 2014-04-16 ENCOUNTER — Ambulatory Visit: Payer: Medicare HMO

## 2014-04-16 DIAGNOSIS — F411 Generalized anxiety disorder: Secondary | ICD-10-CM

## 2014-04-16 DIAGNOSIS — I739 Peripheral vascular disease, unspecified: Secondary | ICD-10-CM

## 2014-04-16 DIAGNOSIS — N189 Chronic kidney disease, unspecified: Secondary | ICD-10-CM

## 2014-04-16 DIAGNOSIS — I10 Essential (primary) hypertension: Secondary | ICD-10-CM

## 2014-04-16 DIAGNOSIS — D696 Thrombocytopenia, unspecified: Secondary | ICD-10-CM

## 2014-04-16 DIAGNOSIS — E785 Hyperlipidemia, unspecified: Secondary | ICD-10-CM

## 2014-04-16 DIAGNOSIS — M545 Low back pain, unspecified: Secondary | ICD-10-CM

## 2014-04-16 DIAGNOSIS — K219 Gastro-esophageal reflux disease without esophagitis: Secondary | ICD-10-CM

## 2014-04-16 DIAGNOSIS — D649 Anemia, unspecified: Secondary | ICD-10-CM

## 2014-04-16 DIAGNOSIS — I5032 Chronic diastolic (congestive) heart failure: Secondary | ICD-10-CM

## 2014-04-16 DIAGNOSIS — E118 Type 2 diabetes mellitus with unspecified complications: Secondary | ICD-10-CM

## 2014-04-16 DIAGNOSIS — I2510 Atherosclerotic heart disease of native coronary artery without angina pectoris: Secondary | ICD-10-CM

## 2014-04-16 DIAGNOSIS — I359 Nonrheumatic aortic valve disorder, unspecified: Secondary | ICD-10-CM

## 2014-04-16 DIAGNOSIS — I5040 Unspecified combined systolic (congestive) and diastolic (congestive) heart failure: Secondary | ICD-10-CM

## 2014-04-16 DIAGNOSIS — G4700 Insomnia, unspecified: Secondary | ICD-10-CM

## 2014-04-16 DIAGNOSIS — I6529 Occlusion and stenosis of unspecified carotid artery: Secondary | ICD-10-CM

## 2014-04-16 LAB — CBC WITH DIFFERENTIAL/PLATELET
BASO%: 0.7 % (ref 0.0–2.0)
BASOS ABS: 0 10*3/uL (ref 0.0–0.1)
EOS ABS: 0.2 10*3/uL (ref 0.0–0.5)
EOS%: 4 % (ref 0.0–7.0)
HEMATOCRIT: 33.9 % — AB (ref 34.8–46.6)
HEMOGLOBIN: 11 g/dL — AB (ref 11.6–15.9)
LYMPH%: 25.3 % (ref 14.0–49.7)
MCH: 36.2 pg — AB (ref 25.1–34.0)
MCHC: 32.3 g/dL (ref 31.5–36.0)
MCV: 112 fL — AB (ref 79.5–101.0)
MONO#: 0.7 10*3/uL (ref 0.1–0.9)
MONO%: 13.8 % (ref 0.0–14.0)
NEUT%: 56.2 % (ref 38.4–76.8)
NEUTROS ABS: 2.8 10*3/uL (ref 1.5–6.5)
PLATELETS: 60 10*3/uL — AB (ref 145–400)
RBC: 3.03 10*6/uL — ABNORMAL LOW (ref 3.70–5.45)
RDW: 15.4 % — ABNORMAL HIGH (ref 11.2–14.5)
WBC: 5 10*3/uL (ref 3.9–10.3)
lymph#: 1.3 10*3/uL (ref 0.9–3.3)

## 2014-04-16 MED ORDER — DARBEPOETIN ALFA-POLYSORBATE 300 MCG/0.6ML IJ SOLN: 300.0000 ug | Freq: Once | INTRAMUSCULAR | Status: DC

## 2014-04-22 ENCOUNTER — Telehealth: Payer: Self-pay | Admitting: Cardiology

## 2014-04-22 NOTE — Telephone Encounter (Signed)
1. Is there a program for patient assistance for Crestor?  2. Would have her retry atorvastatin at 40 mg daily.

## 2014-04-22 NOTE — Telephone Encounter (Signed)
Called stating she is taking Crestor and has tolerated it w/o problems.  Her insurance says she is in "donut hole" and it will cost her $340 for RX.  She can't afford and wants to know if there is a generic she can take.  Has tried Lipitor in past and caused stomach pain.  Also has tried Zocor but her PCP told her not to take due to an interaction with one of her meds.(she didn't know which one). States she has about a week supply of Crestor. Advised will forward to Dr. Aundra Dubin for recommendations.

## 2014-04-22 NOTE — Telephone Encounter (Signed)
New message      Have a question about crestor.  Ins is not paying well, is there a generic she can take?

## 2014-04-23 NOTE — Telephone Encounter (Signed)
Spoke with Otila Kluver at [place] [phone], about patient assistance when pt is on Crestor and is in the donut hole. She recommended pt call [phone]. Otila Kluver states someone at that  number would be able to tell pt if she is eligible for any financial assistance for Crestor when she is in the donut hole.   LMTCB for pt.

## 2014-04-23 NOTE — Telephone Encounter (Signed)
Follow up      Returned Anne's call

## 2014-04-23 NOTE — Telephone Encounter (Signed)
Pt advised, verbalized understanding.  She will call back if she needs to change to atorvastatin from crestor.

## 2014-05-07 ENCOUNTER — Ambulatory Visit (HOSPITAL_BASED_OUTPATIENT_CLINIC_OR_DEPARTMENT_OTHER): Payer: Medicare HMO

## 2014-05-07 ENCOUNTER — Other Ambulatory Visit (HOSPITAL_BASED_OUTPATIENT_CLINIC_OR_DEPARTMENT_OTHER): Payer: Medicare HMO

## 2014-05-07 VITALS — BP 143/55 | HR 54 | Temp 97.6°F

## 2014-05-07 DIAGNOSIS — D696 Thrombocytopenia, unspecified: Secondary | ICD-10-CM

## 2014-05-07 DIAGNOSIS — F411 Generalized anxiety disorder: Secondary | ICD-10-CM

## 2014-05-07 DIAGNOSIS — M545 Low back pain, unspecified: Secondary | ICD-10-CM

## 2014-05-07 DIAGNOSIS — D649 Anemia, unspecified: Secondary | ICD-10-CM

## 2014-05-07 DIAGNOSIS — E785 Hyperlipidemia, unspecified: Secondary | ICD-10-CM

## 2014-05-07 DIAGNOSIS — I359 Nonrheumatic aortic valve disorder, unspecified: Secondary | ICD-10-CM

## 2014-05-07 DIAGNOSIS — I5032 Chronic diastolic (congestive) heart failure: Secondary | ICD-10-CM

## 2014-05-07 DIAGNOSIS — E118 Type 2 diabetes mellitus with unspecified complications: Secondary | ICD-10-CM

## 2014-05-07 DIAGNOSIS — I10 Essential (primary) hypertension: Secondary | ICD-10-CM

## 2014-05-07 DIAGNOSIS — K219 Gastro-esophageal reflux disease without esophagitis: Secondary | ICD-10-CM

## 2014-05-07 DIAGNOSIS — I6529 Occlusion and stenosis of unspecified carotid artery: Secondary | ICD-10-CM

## 2014-05-07 DIAGNOSIS — G4700 Insomnia, unspecified: Secondary | ICD-10-CM

## 2014-05-07 DIAGNOSIS — I5040 Unspecified combined systolic (congestive) and diastolic (congestive) heart failure: Secondary | ICD-10-CM

## 2014-05-07 DIAGNOSIS — N189 Chronic kidney disease, unspecified: Secondary | ICD-10-CM

## 2014-05-07 DIAGNOSIS — I739 Peripheral vascular disease, unspecified: Secondary | ICD-10-CM

## 2014-05-07 DIAGNOSIS — N289 Disorder of kidney and ureter, unspecified: Secondary | ICD-10-CM

## 2014-05-07 DIAGNOSIS — I2510 Atherosclerotic heart disease of native coronary artery without angina pectoris: Secondary | ICD-10-CM

## 2014-05-07 LAB — CBC WITH DIFFERENTIAL/PLATELET
BASO%: 0.5 % (ref 0.0–2.0)
Basophils Absolute: 0 10*3/uL (ref 0.0–0.1)
EOS%: 3.7 % (ref 0.0–7.0)
Eosinophils Absolute: 0.2 10*3/uL (ref 0.0–0.5)
HEMATOCRIT: 30.4 % — AB (ref 34.8–46.6)
HEMOGLOBIN: 9.9 g/dL — AB (ref 11.6–15.9)
LYMPH#: 1.6 10*3/uL (ref 0.9–3.3)
LYMPH%: 28.3 % (ref 14.0–49.7)
MCH: 35.7 pg — ABNORMAL HIGH (ref 25.1–34.0)
MCHC: 32.6 g/dL (ref 31.5–36.0)
MCV: 109.8 fL — ABNORMAL HIGH (ref 79.5–101.0)
MONO#: 0.6 10*3/uL (ref 0.1–0.9)
MONO%: 10.8 % (ref 0.0–14.0)
NEUT#: 3.2 10*3/uL (ref 1.5–6.5)
NEUT%: 56.7 % (ref 38.4–76.8)
Platelets: 94 10*3/uL — ABNORMAL LOW (ref 145–400)
RBC: 2.77 10*6/uL — ABNORMAL LOW (ref 3.70–5.45)
RDW: 13.4 % (ref 11.2–14.5)
WBC: 5.7 10*3/uL (ref 3.9–10.3)

## 2014-05-07 MED ORDER — DARBEPOETIN ALFA-POLYSORBATE 300 MCG/0.6ML IJ SOLN
300.0000 ug | Freq: Once | INTRAMUSCULAR | Status: AC
  Administered 2014-05-07: 300 ug via SUBCUTANEOUS
  Filled 2014-05-07: qty 0.6

## 2014-05-28 ENCOUNTER — Ambulatory Visit (HOSPITAL_BASED_OUTPATIENT_CLINIC_OR_DEPARTMENT_OTHER): Payer: Medicare HMO

## 2014-05-28 ENCOUNTER — Other Ambulatory Visit (HOSPITAL_BASED_OUTPATIENT_CLINIC_OR_DEPARTMENT_OTHER): Payer: Medicare HMO

## 2014-05-28 VITALS — BP 146/53 | HR 59 | Temp 97.5°F

## 2014-05-28 DIAGNOSIS — I2510 Atherosclerotic heart disease of native coronary artery without angina pectoris: Secondary | ICD-10-CM

## 2014-05-28 DIAGNOSIS — E118 Type 2 diabetes mellitus with unspecified complications: Secondary | ICD-10-CM

## 2014-05-28 DIAGNOSIS — N189 Chronic kidney disease, unspecified: Secondary | ICD-10-CM

## 2014-05-28 DIAGNOSIS — M545 Low back pain, unspecified: Secondary | ICD-10-CM

## 2014-05-28 DIAGNOSIS — I5040 Unspecified combined systolic (congestive) and diastolic (congestive) heart failure: Secondary | ICD-10-CM

## 2014-05-28 DIAGNOSIS — I5032 Chronic diastolic (congestive) heart failure: Secondary | ICD-10-CM

## 2014-05-28 DIAGNOSIS — N289 Disorder of kidney and ureter, unspecified: Secondary | ICD-10-CM

## 2014-05-28 DIAGNOSIS — I10 Essential (primary) hypertension: Secondary | ICD-10-CM

## 2014-05-28 DIAGNOSIS — F411 Generalized anxiety disorder: Secondary | ICD-10-CM

## 2014-05-28 DIAGNOSIS — D696 Thrombocytopenia, unspecified: Secondary | ICD-10-CM

## 2014-05-28 DIAGNOSIS — E785 Hyperlipidemia, unspecified: Secondary | ICD-10-CM

## 2014-05-28 DIAGNOSIS — D649 Anemia, unspecified: Secondary | ICD-10-CM

## 2014-05-28 DIAGNOSIS — I359 Nonrheumatic aortic valve disorder, unspecified: Secondary | ICD-10-CM

## 2014-05-28 DIAGNOSIS — K219 Gastro-esophageal reflux disease without esophagitis: Secondary | ICD-10-CM

## 2014-05-28 DIAGNOSIS — G4700 Insomnia, unspecified: Secondary | ICD-10-CM

## 2014-05-28 DIAGNOSIS — D631 Anemia in chronic kidney disease: Secondary | ICD-10-CM

## 2014-05-28 DIAGNOSIS — I739 Peripheral vascular disease, unspecified: Secondary | ICD-10-CM

## 2014-05-28 DIAGNOSIS — I6529 Occlusion and stenosis of unspecified carotid artery: Secondary | ICD-10-CM

## 2014-05-28 LAB — CBC WITH DIFFERENTIAL/PLATELET
BASO%: 0.4 % (ref 0.0–2.0)
BASOS ABS: 0 10*3/uL (ref 0.0–0.1)
EOS ABS: 0.2 10*3/uL (ref 0.0–0.5)
EOS%: 3.8 % (ref 0.0–7.0)
HCT: 33.2 % — ABNORMAL LOW (ref 34.8–46.6)
HEMOGLOBIN: 10.6 g/dL — AB (ref 11.6–15.9)
LYMPH#: 1.3 10*3/uL (ref 0.9–3.3)
LYMPH%: 29.6 % (ref 14.0–49.7)
MCH: 35.9 pg — ABNORMAL HIGH (ref 25.1–34.0)
MCHC: 32 g/dL (ref 31.5–36.0)
MCV: 112.1 fL — ABNORMAL HIGH (ref 79.5–101.0)
MONO#: 0.5 10*3/uL (ref 0.1–0.9)
MONO%: 12 % (ref 0.0–14.0)
NEUT%: 54.2 % (ref 38.4–76.8)
NEUTROS ABS: 2.3 10*3/uL (ref 1.5–6.5)
Platelets: 63 10*3/uL — ABNORMAL LOW (ref 145–400)
RBC: 2.97 10*6/uL — ABNORMAL LOW (ref 3.70–5.45)
RDW: 14.3 % (ref 11.2–14.5)
WBC: 4.3 10*3/uL (ref 3.9–10.3)

## 2014-05-28 MED ORDER — DARBEPOETIN ALFA-POLYSORBATE 300 MCG/0.6ML IJ SOLN
300.0000 ug | Freq: Once | INTRAMUSCULAR | Status: AC
  Administered 2014-05-28: 300 ug via SUBCUTANEOUS
  Filled 2014-05-28: qty 0.6

## 2014-05-28 NOTE — Patient Instructions (Signed)
Darbepoetin Alfa injection What is this medicine? DARBEPOETIN ALFA (dar be POE e tin AL fa) helps your body make more red blood cells. It is used to treat anemia caused by chronic kidney failure and chemotherapy. This medicine may be used for other purposes; ask your health care provider or pharmacist if you have questions. COMMON BRAND NAME(S): Aranesp What should I tell my health care provider before I take this medicine? They need to know if you have any of these conditions: -blood clotting disorders or history of blood clots -cancer patient not on chemotherapy -cystic fibrosis -heart disease, such as angina, heart failure, or a history of a heart attack -hemoglobin level of 12 g/dL or greater -high blood pressure -low levels of folate, iron, or vitamin B12 -seizures -an unusual or allergic reaction to darbepoetin, erythropoietin, albumin, hamster proteins, latex, other medicines, foods, dyes, or preservatives -pregnant or trying to get pregnant -breast-feeding How should I use this medicine? This medicine is for injection into a vein or under the skin. It is usually given by a health care professional in a hospital or clinic setting. If you get this medicine at home, you will be taught how to prepare and give this medicine. Do not shake the solution before you withdraw a dose. Use exactly as directed. Take your medicine at regular intervals. Do not take your medicine more often than directed. It is important that you put your used needles and syringes in a special sharps container. Do not put them in a trash can. If you do not have a sharps container, call your pharmacist or healthcare provider to get one. Talk to your pediatrician regarding the use of this medicine in children. While this medicine may be used in children as young as 1 year for selected conditions, precautions do apply. Overdosage: If you think you have taken too much of this medicine contact a poison control center or  emergency room at once. NOTE: This medicine is only for you. Do not share this medicine with others. What if I miss a dose? If you miss a dose, take it as soon as you can. If it is almost time for your next dose, take only that dose. Do not take double or extra doses. What may interact with this medicine? Do not take this medicine with any of the following medications: -epoetin alfa This list may not describe all possible interactions. Give your health care provider a list of all the medicines, herbs, non-prescription drugs, or dietary supplements you use. Also tell them if you smoke, drink alcohol, or use illegal drugs. Some items may interact with your medicine. What should I watch for while using this medicine? Visit your prescriber or health care professional for regular checks on your progress and for the needed blood tests and blood pressure measurements. It is especially important for the doctor to make sure your hemoglobin level is in the desired range, to limit the risk of potential side effects and to give you the best benefit. Keep all appointments for any recommended tests. Check your blood pressure as directed. Ask your doctor what your blood pressure should be and when you should contact him or her. As your body makes more red blood cells, you may need to take iron, folic acid, or vitamin B supplements. Ask your doctor or health care provider which products are right for you. If you have kidney disease continue dietary restrictions, even though this medication can make you feel better. Talk with your doctor or health   care professional about the foods you eat and the vitamins that you take. What side effects may I notice from receiving this medicine? Side effects that you should report to your doctor or health care professional as soon as possible: -allergic reactions like skin rash, itching or hives, swelling of the face, lips, or tongue -breathing problems -changes in vision -chest  pain -confusion, trouble speaking or understanding -feeling faint or lightheaded, falls -high blood pressure -muscle aches or pains -pain, swelling, warmth in the leg -rapid weight gain -severe headaches -sudden numbness or weakness of the face, arm or leg -trouble walking, dizziness, loss of balance or coordination -seizures (convulsions) -swelling of the ankles, feet, hands -unusually weak or tired Side effects that usually do not require medical attention (report to your doctor or health care professional if they continue or are bothersome): -diarrhea -fever, chills (flu-like symptoms) -headaches -nausea, vomiting -redness, stinging, or swelling at site where injected This list may not describe all possible side effects. Call your doctor for medical advice about side effects. You may report side effects to FDA at 1-800-FDA-1088. Where should I keep my medicine? Keep out of the reach of children. Store in a refrigerator between 2 and 8 degrees C (36 and 46 degrees F). Do not freeze. Do not shake. Throw away any unused portion if using a single-dose vial. Throw away any unused medicine after the expiration date. NOTE: This sheet is a summary. It may not cover all possible information. If you have questions about this medicine, talk to your doctor, pharmacist, or health care provider.  2015, [employer]. (2008-07-15 10:23:57)  

## 2014-06-17 ENCOUNTER — Other Ambulatory Visit: Payer: Self-pay | Admitting: Oncology

## 2014-06-17 DIAGNOSIS — D508 Other iron deficiency anemias: Secondary | ICD-10-CM

## 2014-06-18 ENCOUNTER — Other Ambulatory Visit (HOSPITAL_BASED_OUTPATIENT_CLINIC_OR_DEPARTMENT_OTHER): Payer: Medicare HMO

## 2014-06-18 ENCOUNTER — Ambulatory Visit: Payer: Medicare HMO

## 2014-06-18 DIAGNOSIS — D508 Other iron deficiency anemias: Secondary | ICD-10-CM

## 2014-06-18 DIAGNOSIS — D649 Anemia, unspecified: Secondary | ICD-10-CM

## 2014-06-18 LAB — CBC WITH DIFFERENTIAL/PLATELET
BASO%: 0.6 % (ref 0.0–2.0)
Basophils Absolute: 0 10*3/uL (ref 0.0–0.1)
EOS%: 3.6 % (ref 0.0–7.0)
Eosinophils Absolute: 0.2 10*3/uL (ref 0.0–0.5)
HEMATOCRIT: 37.5 % (ref 34.8–46.6)
HGB: 12 g/dL (ref 11.6–15.9)
LYMPH%: 24.8 % (ref 14.0–49.7)
MCH: 35.5 pg — AB (ref 25.1–34.0)
MCHC: 32 g/dL (ref 31.5–36.0)
MCV: 110.9 fL — ABNORMAL HIGH (ref 79.5–101.0)
MONO#: 0.6 10*3/uL (ref 0.1–0.9)
MONO%: 11.5 % (ref 0.0–14.0)
NEUT%: 59.5 % (ref 38.4–76.8)
NEUTROS ABS: 3.1 10*3/uL (ref 1.5–6.5)
PLATELETS: 82 10*3/uL — AB (ref 145–400)
RBC: 3.38 10*6/uL — ABNORMAL LOW (ref 3.70–5.45)
RDW: 13.6 % (ref 11.2–14.5)
WBC: 5.3 10*3/uL (ref 3.9–10.3)
lymph#: 1.3 10*3/uL (ref 0.9–3.3)

## 2014-06-18 NOTE — Progress Notes (Signed)
Hgb: 12.0.  Patient aware.  No aranesp given

## 2014-07-02 ENCOUNTER — Other Ambulatory Visit: Payer: Self-pay | Admitting: Oncology

## 2014-07-02 DIAGNOSIS — D508 Other iron deficiency anemias: Secondary | ICD-10-CM

## 2014-07-09 ENCOUNTER — Ambulatory Visit (HOSPITAL_BASED_OUTPATIENT_CLINIC_OR_DEPARTMENT_OTHER): Payer: Medicare HMO

## 2014-07-09 ENCOUNTER — Ambulatory Visit (HOSPITAL_BASED_OUTPATIENT_CLINIC_OR_DEPARTMENT_OTHER): Payer: Medicare HMO | Admitting: Oncology

## 2014-07-09 ENCOUNTER — Telehealth: Payer: Self-pay | Admitting: Oncology

## 2014-07-09 ENCOUNTER — Other Ambulatory Visit (HOSPITAL_BASED_OUTPATIENT_CLINIC_OR_DEPARTMENT_OTHER): Payer: Medicare HMO

## 2014-07-09 VITALS — BP 140/49 | HR 65 | Temp 97.4°F | Resp 18 | Ht 68.0 in | Wt 142.3 lb

## 2014-07-09 DIAGNOSIS — N189 Chronic kidney disease, unspecified: Secondary | ICD-10-CM

## 2014-07-09 DIAGNOSIS — I10 Essential (primary) hypertension: Secondary | ICD-10-CM

## 2014-07-09 DIAGNOSIS — F411 Generalized anxiety disorder: Secondary | ICD-10-CM

## 2014-07-09 DIAGNOSIS — D61818 Other pancytopenia: Secondary | ICD-10-CM

## 2014-07-09 DIAGNOSIS — E785 Hyperlipidemia, unspecified: Secondary | ICD-10-CM

## 2014-07-09 DIAGNOSIS — D508 Other iron deficiency anemias: Secondary | ICD-10-CM

## 2014-07-09 DIAGNOSIS — D649 Anemia, unspecified: Secondary | ICD-10-CM

## 2014-07-09 DIAGNOSIS — I6529 Occlusion and stenosis of unspecified carotid artery: Secondary | ICD-10-CM

## 2014-07-09 DIAGNOSIS — I5032 Chronic diastolic (congestive) heart failure: Secondary | ICD-10-CM

## 2014-07-09 DIAGNOSIS — I5040 Unspecified combined systolic (congestive) and diastolic (congestive) heart failure: Secondary | ICD-10-CM

## 2014-07-09 DIAGNOSIS — D631 Anemia in chronic kidney disease: Secondary | ICD-10-CM

## 2014-07-09 DIAGNOSIS — N289 Disorder of kidney and ureter, unspecified: Secondary | ICD-10-CM

## 2014-07-09 DIAGNOSIS — D696 Thrombocytopenia, unspecified: Secondary | ICD-10-CM

## 2014-07-09 DIAGNOSIS — E118 Type 2 diabetes mellitus with unspecified complications: Secondary | ICD-10-CM

## 2014-07-09 DIAGNOSIS — G4700 Insomnia, unspecified: Secondary | ICD-10-CM

## 2014-07-09 DIAGNOSIS — I2510 Atherosclerotic heart disease of native coronary artery without angina pectoris: Secondary | ICD-10-CM

## 2014-07-09 DIAGNOSIS — I739 Peripheral vascular disease, unspecified: Secondary | ICD-10-CM

## 2014-07-09 DIAGNOSIS — I359 Nonrheumatic aortic valve disorder, unspecified: Secondary | ICD-10-CM

## 2014-07-09 LAB — CBC WITH DIFFERENTIAL/PLATELET
BASO%: 0.4 % (ref 0.0–2.0)
BASOS ABS: 0 10*3/uL (ref 0.0–0.1)
EOS ABS: 0.2 10*3/uL (ref 0.0–0.5)
EOS%: 3.5 % (ref 0.0–7.0)
HCT: 31.9 % — ABNORMAL LOW (ref 34.8–46.6)
HEMOGLOBIN: 10.4 g/dL — AB (ref 11.6–15.9)
LYMPH%: 24.1 % (ref 14.0–49.7)
MCH: 35.1 pg — ABNORMAL HIGH (ref 25.1–34.0)
MCHC: 32.7 g/dL (ref 31.5–36.0)
MCV: 107.4 fL — AB (ref 79.5–101.0)
MONO#: 0.8 10*3/uL (ref 0.1–0.9)
MONO%: 12.2 % (ref 0.0–14.0)
NEUT#: 3.9 10*3/uL (ref 1.5–6.5)
NEUT%: 59.8 % (ref 38.4–76.8)
Platelets: 84 10*3/uL — ABNORMAL LOW (ref 145–400)
RBC: 2.97 10*6/uL — ABNORMAL LOW (ref 3.70–5.45)
RDW: 12.7 % (ref 11.2–14.5)
WBC: 6.5 10*3/uL (ref 3.9–10.3)
lymph#: 1.6 10*3/uL (ref 0.9–3.3)

## 2014-07-09 LAB — COMPREHENSIVE METABOLIC PANEL (CC13)
ALK PHOS: 100 U/L (ref 40–150)
ALT: 21 U/L (ref 0–55)
ANION GAP: 8 meq/L (ref 3–11)
AST: 24 U/L (ref 5–34)
Albumin: 3.9 g/dL (ref 3.5–5.0)
BILIRUBIN TOTAL: 0.44 mg/dL (ref 0.20–1.20)
BUN: 47.9 mg/dL — ABNORMAL HIGH (ref 7.0–26.0)
CO2: 21 meq/L — AB (ref 22–29)
Calcium: 9.6 mg/dL (ref 8.4–10.4)
Chloride: 104 mEq/L (ref 98–109)
Creatinine: 2.9 mg/dL — ABNORMAL HIGH (ref 0.6–1.1)
GLUCOSE: 136 mg/dL (ref 70–140)
POTASSIUM: 5.1 meq/L (ref 3.5–5.1)
Sodium: 134 mEq/L — ABNORMAL LOW (ref 136–145)
Total Protein: 6.9 g/dL (ref 6.4–8.3)

## 2014-07-09 LAB — IRON AND TIBC CHCC
%SAT: 41 % (ref 21–57)
Iron: 110 ug/dL (ref 41–142)
TIBC: 266 ug/dL (ref 236–444)
UIBC: 156 ug/dL (ref 120–384)

## 2014-07-09 LAB — FERRITIN CHCC: Ferritin: 273 ng/ml — ABNORMAL HIGH (ref 9–269)

## 2014-07-09 MED ORDER — DARBEPOETIN ALFA 300 MCG/0.6ML IJ SOSY
300.0000 ug | PREFILLED_SYRINGE | Freq: Once | INTRAMUSCULAR | Status: AC
  Administered 2014-07-09: 300 ug via SUBCUTANEOUS
  Filled 2014-07-09: qty 0.6

## 2014-07-09 NOTE — Progress Notes (Signed)
Hematology and Oncology Follow Up Visit  [name] [id number] [birth year] 73 y.o. 07/09/2014 10:05 AM [name], MDWilson, Jeanne Flavors, MD   Principle Diagnosis: This is a 73 year old female with multifactorial anemia. She has anemia of chronic disease, anemia of renal disease, diagnosed in 2011.  Current therapy: Aranesp 300 mcg subcutaneously every 3 weeks keeping hemoglobin above 11.  Interim History:  [name] presents today for a followup visit with her husband. Since the last visit, she reports no issues. She continues to tolerate Aranesp without any complications. She does report improvement in her quality of life and her energy. She denies any recent falls but requires assistance from her husband for certain activities of daily living. She has not had any problems associated with Aranesp. She had not had any injection or infusion problem and denied any toxicities. She has not noticed any bleeding or thrombosis. She reports no recent illnesses or hospitalizations. She has not reported any headaches or blurry vision or double vision. She does not report any chest pain shortness of breath or difficulty breathing. She does not report any nausea or vomiting or abdominal pain. She does not report any genitourinary complaints. She has not reported any bleeding or clotting tendencies. She has not reported any lymphadenopathy or petechiae. Rest of her review of systems unremarkable.  Medications: I have reviewed the patient's current medications.  Current Outpatient Prescriptions  Medication Sig Dispense Refill  . cholecalciferol (VITAMIN D) 1000 UNITS tablet Take 1,000 Units by mouth daily.    [name] ALPRAZolam (XANAX) 0.5 MG tablet Take 0.5 mg by mouth daily.    [name] amLODipine (NORVASC) 5 MG tablet Take 1 tablet (5 mg total) by mouth daily. 30 tablet 11  . aspirin 81 MG tablet Take 81 mg by mouth daily.     . carvedilol (COREG) 12.5 MG tablet TAKE 1 TABLET BY MOUTH TWICE A DAY 60 tablet 5  .  CRESTOR 5 MG tablet TAKE 1 TABLET BY MOUTH EVERY DAY 90 tablet 1  . cyanocobalamin 1000 MCG tablet Take 100 mcg by mouth daily.    [name] FLUZONE HIGH-DOSE 0.5 ML SUSY   0  . losartan (COZAAR) 25 MG tablet Take 1 tablet (25 mg total) by mouth daily. 90 tablet 3  . metFORMIN (GLUCOPHAGE) 1000 MG tablet Take 500 mg by mouth 2 (two) times daily with a meal.     . Multiple Vitamin (MULTIVITAMIN) capsule Take 1 capsule by mouth daily.    . niacin (NIASPAN) 1000 MG CR tablet Take 1,000 mg by mouth at bedtime.     [name] omeprazole (PRILOSEC) 20 MG capsule Take 20 mg by mouth daily. As needed    . timolol (BETIMOL) 0.5 % ophthalmic solution 1 drop 2 (two) times daily.    . [DISCONTINUED] metoprolol succinate (TOPROL-XL) 50 MG 24 hr tablet Take 50 mg by mouth 2 (two) times daily. Take with or immediately following a meal.     No current facility-administered medications for this visit.     Allergies:  Allergies  Allergen Reactions  . Lipitor [Atorvastatin]     Stomach pain    Past Medical History, Surgical history, Social history, and Family History were reviewed and updated.    Physical Exam: Blood pressure 140/49, pulse 65, temperature 97.4 F (36.3 C), temperature source Oral, resp. rate 18, height 5\' 8"  (1.727 m), weight 142 lb 4.8 oz (64.547 kg). ECOG: 1 General appearance: alert and cooperative Head: Normocephalic, without obvious abnormality, atraumatic Neck: no adenopathy, no carotid  bruit, no JVD, supple, symmetrical, trachea midline and thyroid not enlarged, symmetric, no tenderness/mass/nodules Lymph nodes: Cervical, supraclavicular, and axillary nodes normal. Heart:regular rate and rhythm, S1, S2 normal, no murmur, click, rub or gallop Lung:chest clear, no wheezing, rales, normal symmetric air entry, no tachypnea, retractions or cyanosis Abdomen: soft, non-tender, without masses or organomegaly EXT:no erythema, induration, or nodules   Lab Results: Lab Results  Component Value  Date   WBC 6.5 07/09/2014   HGB 10.4* 07/09/2014   HCT 31.9* 07/09/2014   MCV 107.4* 07/09/2014   PLT 84* 07/09/2014     Chemistry      Component Value Date/Time   NA 138 12/20/2013 1103   NA 135* 08/16/2012 0757   K 4.6 12/20/2013 1103   K 4.6 08/16/2012 0757   CL 105 12/20/2013 1103   CL 101 08/16/2012 0757   CO2 24 12/20/2013 1103   CO2 21* 08/16/2012 0757   BUN 29* 12/20/2013 1103   BUN 25.0 08/16/2012 0757   CREATININE 2.4* 12/20/2013 1103   CREATININE 2.1* 08/16/2012 0757      Component Value Date/Time   CALCIUM 10.1 12/20/2013 1103   CALCIUM 9.2 08/16/2012 0757   ALKPHOS 91 08/16/2012 0757   ALKPHOS 75 12/26/2011 0933   AST 21 08/16/2012 0757   AST 23 12/26/2011 0933   ALT 10 08/16/2012 0757   ALT 14 12/26/2011 0933   BILITOT 0.50 08/16/2012 0757   BILITOT 0.6 12/26/2011 0933     Impression and Plan:   73 year old female with the following issues:   1. 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 We will keep her on Aranesp, keep her hemoglobin above 11. Hemoglobin is 10.4. Aranesp will be given today.   2. Thrombocytopenia. Her platelet count is stable. No major bleeding at this time. However, we will continue to monitor her blood counts closely.   3. Follow-up: CBC and Aranesp injection every 3 weeks. Visit in 6 months.       [name] 11/25/201510:05 AM

## 2014-07-09 NOTE — Telephone Encounter (Signed)
Gave avs & cal Dec thru May 2016.

## 2014-07-14 ENCOUNTER — Other Ambulatory Visit: Payer: Self-pay | Admitting: Cardiology

## 2014-07-30 ENCOUNTER — Other Ambulatory Visit (HOSPITAL_BASED_OUTPATIENT_CLINIC_OR_DEPARTMENT_OTHER): Payer: Medicare HMO

## 2014-07-30 ENCOUNTER — Ambulatory Visit (HOSPITAL_BASED_OUTPATIENT_CLINIC_OR_DEPARTMENT_OTHER): Payer: Medicare HMO

## 2014-07-30 VITALS — BP 142/64 | HR 62 | Temp 97.7°F | Resp 20

## 2014-07-30 DIAGNOSIS — F411 Generalized anxiety disorder: Secondary | ICD-10-CM

## 2014-07-30 DIAGNOSIS — I739 Peripheral vascular disease, unspecified: Secondary | ICD-10-CM

## 2014-07-30 DIAGNOSIS — D649 Anemia, unspecified: Secondary | ICD-10-CM

## 2014-07-30 DIAGNOSIS — D61818 Other pancytopenia: Secondary | ICD-10-CM

## 2014-07-30 DIAGNOSIS — D696 Thrombocytopenia, unspecified: Secondary | ICD-10-CM

## 2014-07-30 DIAGNOSIS — I359 Nonrheumatic aortic valve disorder, unspecified: Secondary | ICD-10-CM

## 2014-07-30 DIAGNOSIS — E785 Hyperlipidemia, unspecified: Secondary | ICD-10-CM

## 2014-07-30 DIAGNOSIS — I5032 Chronic diastolic (congestive) heart failure: Secondary | ICD-10-CM

## 2014-07-30 DIAGNOSIS — N189 Chronic kidney disease, unspecified: Secondary | ICD-10-CM

## 2014-07-30 DIAGNOSIS — E118 Type 2 diabetes mellitus with unspecified complications: Secondary | ICD-10-CM

## 2014-07-30 DIAGNOSIS — I6529 Occlusion and stenosis of unspecified carotid artery: Secondary | ICD-10-CM

## 2014-07-30 DIAGNOSIS — I10 Essential (primary) hypertension: Secondary | ICD-10-CM

## 2014-07-30 DIAGNOSIS — I2510 Atherosclerotic heart disease of native coronary artery without angina pectoris: Secondary | ICD-10-CM

## 2014-07-30 DIAGNOSIS — G4700 Insomnia, unspecified: Secondary | ICD-10-CM

## 2014-07-30 DIAGNOSIS — I5040 Unspecified combined systolic (congestive) and diastolic (congestive) heart failure: Secondary | ICD-10-CM

## 2014-07-30 LAB — CBC WITH DIFFERENTIAL/PLATELET
BASO%: 0.6 % (ref 0.0–2.0)
Basophils Absolute: 0 10*3/uL (ref 0.0–0.1)
EOS%: 3.2 % (ref 0.0–7.0)
Eosinophils Absolute: 0.2 10*3/uL (ref 0.0–0.5)
HCT: 33.4 % — ABNORMAL LOW (ref 34.8–46.6)
HGB: 10.8 g/dL — ABNORMAL LOW (ref 11.6–15.9)
LYMPH#: 1.2 10*3/uL (ref 0.9–3.3)
LYMPH%: 22.6 % (ref 14.0–49.7)
MCH: 35.1 pg — ABNORMAL HIGH (ref 25.1–34.0)
MCHC: 32.2 g/dL (ref 31.5–36.0)
MCV: 109 fL — ABNORMAL HIGH (ref 79.5–101.0)
MONO#: 0.7 10*3/uL (ref 0.1–0.9)
MONO%: 12.8 % (ref 0.0–14.0)
NEUT%: 60.8 % (ref 38.4–76.8)
NEUTROS ABS: 3.4 10*3/uL (ref 1.5–6.5)
Platelets: 78 10*3/uL — ABNORMAL LOW (ref 145–400)
RBC: 3.06 10*6/uL — ABNORMAL LOW (ref 3.70–5.45)
RDW: 13.7 % (ref 11.2–14.5)
WBC: 5.5 10*3/uL (ref 3.9–10.3)

## 2014-07-30 MED ORDER — DARBEPOETIN ALFA 300 MCG/0.6ML IJ SOSY
300.0000 ug | PREFILLED_SYRINGE | Freq: Once | INTRAMUSCULAR | Status: AC
  Administered 2014-07-30: 300 ug via SUBCUTANEOUS
  Filled 2014-07-30: qty 0.6

## 2014-07-30 NOTE — Patient Instructions (Signed)
Darbepoetin Alfa injection What is this medicine? DARBEPOETIN ALFA (dar be POE e tin AL fa) helps your body make more red blood cells. It is used to treat anemia caused by chronic kidney failure and chemotherapy. This medicine may be used for other purposes; ask your health care provider or pharmacist if you have questions. COMMON BRAND NAME(S): Aranesp What should I tell my health care provider before I take this medicine? They need to know if you have any of these conditions: -blood clotting disorders or history of blood clots -cancer patient not on chemotherapy -cystic fibrosis -heart disease, such as angina, heart failure, or a history of a heart attack -hemoglobin level of 12 g/dL or greater -high blood pressure -low levels of folate, iron, or vitamin B12 -seizures -an unusual or allergic reaction to darbepoetin, erythropoietin, albumin, hamster proteins, latex, other medicines, foods, dyes, or preservatives -pregnant or trying to get pregnant -breast-feeding How should I use this medicine? This medicine is for injection into a vein or under the skin. It is usually given by a health care professional in a hospital or clinic setting. If you get this medicine at home, you will be taught how to prepare and give this medicine. Do not shake the solution before you withdraw a dose. Use exactly as directed. Take your medicine at regular intervals. Do not take your medicine more often than directed. It is important that you put your used needles and syringes in a special sharps container. Do not put them in a trash can. If you do not have a sharps container, call your pharmacist or healthcare provider to get one. Talk to your pediatrician regarding the use of this medicine in children. While this medicine may be used in children as young as 1 year for selected conditions, precautions do apply. Overdosage: If you think you have taken too much of this medicine contact a poison control center or  emergency room at once. NOTE: This medicine is only for you. Do not share this medicine with others. What if I miss a dose? If you miss a dose, take it as soon as you can. If it is almost time for your next dose, take only that dose. Do not take double or extra doses. What may interact with this medicine? Do not take this medicine with any of the following medications: -epoetin alfa This list may not describe all possible interactions. Give your health care provider a list of all the medicines, herbs, non-prescription drugs, or dietary supplements you use. Also tell them if you smoke, drink alcohol, or use illegal drugs. Some items may interact with your medicine. What should I watch for while using this medicine? Visit your prescriber or health care professional for regular checks on your progress and for the needed blood tests and blood pressure measurements. It is especially important for the doctor to make sure your hemoglobin level is in the desired range, to limit the risk of potential side effects and to give you the best benefit. Keep all appointments for any recommended tests. Check your blood pressure as directed. Ask your doctor what your blood pressure should be and when you should contact him or her. As your body makes more red blood cells, you may need to take iron, folic acid, or vitamin B supplements. Ask your doctor or health care provider which products are right for you. If you have kidney disease continue dietary restrictions, even though this medication can make you feel better. Talk with your doctor or health   care professional about the foods you eat and the vitamins that you take. What side effects may I notice from receiving this medicine? Side effects that you should report to your doctor or health care professional as soon as possible: -allergic reactions like skin rash, itching or hives, swelling of the face, lips, or tongue -breathing problems -changes in vision -chest  pain -confusion, trouble speaking or understanding -feeling faint or lightheaded, falls -high blood pressure -muscle aches or pains -pain, swelling, warmth in the leg -rapid weight gain -severe headaches -sudden numbness or weakness of the face, arm or leg -trouble walking, dizziness, loss of balance or coordination -seizures (convulsions) -swelling of the ankles, feet, hands -unusually weak or tired Side effects that usually do not require medical attention (report to your doctor or health care professional if they continue or are bothersome): -diarrhea -fever, chills (flu-like symptoms) -headaches -nausea, vomiting -redness, stinging, or swelling at site where injected This list may not describe all possible side effects. Call your doctor for medical advice about side effects. You may report side effects to FDA at 1-800-FDA-1088. Where should I keep my medicine? Keep out of the reach of children. Store in a refrigerator between 2 and 8 degrees C (36 and 46 degrees F). Do not freeze. Do not shake. Throw away any unused portion if using a single-dose vial. Throw away any unused medicine after the expiration date. NOTE: This sheet is a summary. It may not cover all possible information. If you have questions about this medicine, talk to your doctor, pharmacist, or health care provider.  2015, [employer]. (2008-07-15 10:23:57)  

## 2014-08-20 ENCOUNTER — Ambulatory Visit: Payer: Medicare HMO

## 2014-08-20 ENCOUNTER — Other Ambulatory Visit (HOSPITAL_BASED_OUTPATIENT_CLINIC_OR_DEPARTMENT_OTHER): Payer: Medicare HMO

## 2014-08-20 DIAGNOSIS — D61818 Other pancytopenia: Secondary | ICD-10-CM

## 2014-08-20 DIAGNOSIS — D649 Anemia, unspecified: Secondary | ICD-10-CM

## 2014-08-20 LAB — CBC WITH DIFFERENTIAL/PLATELET
BASO%: 0.2 % (ref 0.0–2.0)
Basophils Absolute: 0 10*3/uL (ref 0.0–0.1)
EOS ABS: 0.2 10*3/uL (ref 0.0–0.5)
EOS%: 3.9 % (ref 0.0–7.0)
HEMATOCRIT: 36.8 % (ref 34.8–46.6)
HEMOGLOBIN: 11.8 g/dL (ref 11.6–15.9)
LYMPH%: 30.3 % (ref 14.0–49.7)
MCH: 35.1 pg — AB (ref 25.1–34.0)
MCHC: 32.1 g/dL (ref 31.5–36.0)
MCV: 109.5 fL — ABNORMAL HIGH (ref 79.5–101.0)
MONO#: 0.5 10*3/uL (ref 0.1–0.9)
MONO%: 11.2 % (ref 0.0–14.0)
NEUT#: 2.5 10*3/uL (ref 1.5–6.5)
NEUT%: 54.4 % (ref 38.4–76.8)
Platelets: 63 10*3/uL — ABNORMAL LOW (ref 145–400)
RBC: 3.36 10*6/uL — ABNORMAL LOW (ref 3.70–5.45)
RDW: 13.7 % (ref 11.2–14.5)
WBC: 4.7 10*3/uL (ref 3.9–10.3)
lymph#: 1.4 10*3/uL (ref 0.9–3.3)

## 2014-08-20 NOTE — Patient Instructions (Signed)
Hgb: 11.8. Patient aware. No aranesp given

## 2014-09-10 ENCOUNTER — Other Ambulatory Visit (HOSPITAL_BASED_OUTPATIENT_CLINIC_OR_DEPARTMENT_OTHER): Payer: Medicare HMO

## 2014-09-10 ENCOUNTER — Ambulatory Visit (HOSPITAL_BASED_OUTPATIENT_CLINIC_OR_DEPARTMENT_OTHER): Payer: Medicare HMO

## 2014-09-10 DIAGNOSIS — D61818 Other pancytopenia: Secondary | ICD-10-CM

## 2014-09-10 DIAGNOSIS — D696 Thrombocytopenia, unspecified: Secondary | ICD-10-CM

## 2014-09-10 DIAGNOSIS — D649 Anemia, unspecified: Secondary | ICD-10-CM

## 2014-09-10 DIAGNOSIS — N289 Disorder of kidney and ureter, unspecified: Secondary | ICD-10-CM

## 2014-09-10 DIAGNOSIS — N189 Chronic kidney disease, unspecified: Secondary | ICD-10-CM

## 2014-09-10 DIAGNOSIS — D631 Anemia in chronic kidney disease: Secondary | ICD-10-CM

## 2014-09-10 LAB — CBC WITH DIFFERENTIAL/PLATELET
BASO%: 0.6 % (ref 0.0–2.0)
Basophils Absolute: 0 10*3/uL (ref 0.0–0.1)
EOS%: 3 % (ref 0.0–7.0)
Eosinophils Absolute: 0.2 10*3/uL (ref 0.0–0.5)
HEMATOCRIT: 32 % — AB (ref 34.8–46.6)
HGB: 10.2 g/dL — ABNORMAL LOW (ref 11.6–15.9)
LYMPH#: 1.5 10*3/uL (ref 0.9–3.3)
LYMPH%: 21.4 % (ref 14.0–49.7)
MCH: 34.4 pg — ABNORMAL HIGH (ref 25.1–34.0)
MCHC: 31.9 g/dL (ref 31.5–36.0)
MCV: 107.8 fL — ABNORMAL HIGH (ref 79.5–101.0)
MONO#: 0.6 10*3/uL (ref 0.1–0.9)
MONO%: 9.2 % (ref 0.0–14.0)
NEUT#: 4.5 10*3/uL (ref 1.5–6.5)
NEUT%: 65.8 % (ref 38.4–76.8)
PLATELETS: 82 10*3/uL — AB (ref 145–400)
RBC: 2.96 10*6/uL — AB (ref 3.70–5.45)
RDW: 12.3 % (ref 11.2–14.5)
WBC: 6.8 10*3/uL (ref 3.9–10.3)

## 2014-09-10 MED ORDER — DARBEPOETIN ALFA 300 MCG/0.6ML IJ SOSY
300.0000 ug | PREFILLED_SYRINGE | Freq: Once | INTRAMUSCULAR | Status: AC
  Administered 2014-09-10: 300 ug via SUBCUTANEOUS
  Filled 2014-09-10: qty 0.6

## 2014-10-01 ENCOUNTER — Ambulatory Visit (HOSPITAL_BASED_OUTPATIENT_CLINIC_OR_DEPARTMENT_OTHER): Payer: Medicare HMO

## 2014-10-01 ENCOUNTER — Other Ambulatory Visit (HOSPITAL_BASED_OUTPATIENT_CLINIC_OR_DEPARTMENT_OTHER): Payer: Medicare HMO

## 2014-10-01 DIAGNOSIS — N189 Chronic kidney disease, unspecified: Secondary | ICD-10-CM

## 2014-10-01 DIAGNOSIS — D649 Anemia, unspecified: Secondary | ICD-10-CM | POA: Diagnosis not present

## 2014-10-01 DIAGNOSIS — G4700 Insomnia, unspecified: Secondary | ICD-10-CM

## 2014-10-01 DIAGNOSIS — D631 Anemia in chronic kidney disease: Secondary | ICD-10-CM | POA: Diagnosis not present

## 2014-10-01 DIAGNOSIS — D638 Anemia in other chronic diseases classified elsewhere: Secondary | ICD-10-CM

## 2014-10-01 DIAGNOSIS — D61818 Other pancytopenia: Secondary | ICD-10-CM

## 2014-10-01 DIAGNOSIS — D696 Thrombocytopenia, unspecified: Secondary | ICD-10-CM

## 2014-10-01 LAB — CBC WITH DIFFERENTIAL/PLATELET
BASO%: 0.2 % (ref 0.0–2.0)
BASOS ABS: 0 10*3/uL (ref 0.0–0.1)
EOS ABS: 0.2 10*3/uL (ref 0.0–0.5)
EOS%: 3.1 % (ref 0.0–7.0)
HCT: 32.7 % — ABNORMAL LOW (ref 34.8–46.6)
HEMOGLOBIN: 10.5 g/dL — AB (ref 11.6–15.9)
LYMPH#: 1.6 10*3/uL (ref 0.9–3.3)
LYMPH%: 30.8 % (ref 14.0–49.7)
MCH: 35.2 pg — ABNORMAL HIGH (ref 25.1–34.0)
MCHC: 32.1 g/dL (ref 31.5–36.0)
MCV: 109.7 fL — AB (ref 79.5–101.0)
MONO#: 0.6 10*3/uL (ref 0.1–0.9)
MONO%: 11.8 % (ref 0.0–14.0)
NEUT%: 54.1 % (ref 38.4–76.8)
NEUTROS ABS: 2.8 10*3/uL (ref 1.5–6.5)
Platelets: 72 10*3/uL — ABNORMAL LOW (ref 145–400)
RBC: 2.98 10*6/uL — ABNORMAL LOW (ref 3.70–5.45)
RDW: 14.4 % (ref 11.2–14.5)
WBC: 5.2 10*3/uL (ref 3.9–10.3)
nRBC: 0 % (ref 0–0)

## 2014-10-01 MED ORDER — DARBEPOETIN ALFA 300 MCG/0.6ML IJ SOSY
300.0000 ug | PREFILLED_SYRINGE | Freq: Once | INTRAMUSCULAR | Status: AC
  Administered 2014-10-01: 300 ug via SUBCUTANEOUS
  Filled 2014-10-01: qty 0.6

## 2014-10-22 ENCOUNTER — Ambulatory Visit: Payer: Medicare HMO

## 2014-10-22 ENCOUNTER — Other Ambulatory Visit (HOSPITAL_BASED_OUTPATIENT_CLINIC_OR_DEPARTMENT_OTHER): Payer: Medicare HMO

## 2014-10-22 DIAGNOSIS — D61818 Other pancytopenia: Secondary | ICD-10-CM

## 2014-10-22 DIAGNOSIS — D649 Anemia, unspecified: Secondary | ICD-10-CM

## 2014-10-22 DIAGNOSIS — D696 Thrombocytopenia, unspecified: Secondary | ICD-10-CM

## 2014-10-22 DIAGNOSIS — N189 Chronic kidney disease, unspecified: Secondary | ICD-10-CM

## 2014-10-22 DIAGNOSIS — N289 Disorder of kidney and ureter, unspecified: Secondary | ICD-10-CM

## 2014-10-22 LAB — CBC WITH DIFFERENTIAL/PLATELET
BASO%: 0.4 % (ref 0.0–2.0)
BASOS ABS: 0 10*3/uL (ref 0.0–0.1)
EOS%: 4 % (ref 0.0–7.0)
Eosinophils Absolute: 0.2 10*3/uL (ref 0.0–0.5)
HEMATOCRIT: 36.4 % (ref 34.8–46.6)
HEMOGLOBIN: 11.7 g/dL (ref 11.6–15.9)
LYMPH%: 26.5 % (ref 14.0–49.7)
MCH: 35.6 pg — ABNORMAL HIGH (ref 25.1–34.0)
MCHC: 32.3 g/dL (ref 31.5–36.0)
MCV: 110.3 fL — AB (ref 79.5–101.0)
MONO#: 0.6 10*3/uL (ref 0.1–0.9)
MONO%: 12.3 % (ref 0.0–14.0)
NEUT#: 2.6 10*3/uL (ref 1.5–6.5)
NEUT%: 56.8 % (ref 38.4–76.8)
PLATELETS: 69 10*3/uL — AB (ref 145–400)
RBC: 3.3 10*6/uL — AB (ref 3.70–5.45)
RDW: 14.3 % (ref 11.2–14.5)
WBC: 4.6 10*3/uL (ref 3.9–10.3)
lymph#: 1.2 10*3/uL (ref 0.9–3.3)

## 2014-10-22 MED ORDER — DARBEPOETIN ALFA 300 MCG/0.6ML IJ SOSY: 300.0000 ug | PREFILLED_SYRINGE | Freq: Once | INTRAMUSCULAR | Status: DC

## 2014-11-12 ENCOUNTER — Other Ambulatory Visit (HOSPITAL_BASED_OUTPATIENT_CLINIC_OR_DEPARTMENT_OTHER): Payer: Medicare HMO

## 2014-11-12 ENCOUNTER — Ambulatory Visit (HOSPITAL_BASED_OUTPATIENT_CLINIC_OR_DEPARTMENT_OTHER): Payer: Medicare HMO

## 2014-11-12 DIAGNOSIS — N189 Chronic kidney disease, unspecified: Secondary | ICD-10-CM | POA: Diagnosis not present

## 2014-11-12 DIAGNOSIS — D631 Anemia in chronic kidney disease: Secondary | ICD-10-CM

## 2014-11-12 DIAGNOSIS — D649 Anemia, unspecified: Secondary | ICD-10-CM

## 2014-11-12 DIAGNOSIS — D61818 Other pancytopenia: Secondary | ICD-10-CM

## 2014-11-12 LAB — CBC WITH DIFFERENTIAL/PLATELET
BASO%: 0.2 % (ref 0.0–2.0)
BASOS ABS: 0 10*3/uL (ref 0.0–0.1)
EOS%: 3.6 % (ref 0.0–7.0)
Eosinophils Absolute: 0.2 10*3/uL (ref 0.0–0.5)
HCT: 28.6 % — ABNORMAL LOW (ref 34.8–46.6)
HGB: 9.5 g/dL — ABNORMAL LOW (ref 11.6–15.9)
LYMPH%: 27.9 % (ref 14.0–49.7)
MCH: 35.2 pg — AB (ref 25.1–34.0)
MCHC: 33.2 g/dL (ref 31.5–36.0)
MCV: 105.9 fL — ABNORMAL HIGH (ref 79.5–101.0)
MONO#: 0.4 10*3/uL (ref 0.1–0.9)
MONO%: 7.3 % (ref 0.0–14.0)
NEUT%: 61 % (ref 38.4–76.8)
NEUTROS ABS: 3.7 10*3/uL (ref 1.5–6.5)
Platelets: 63 10*3/uL — ABNORMAL LOW (ref 145–400)
RBC: 2.7 10*6/uL — ABNORMAL LOW (ref 3.70–5.45)
RDW: 12.7 % (ref 11.2–14.5)
WBC: 6 10*3/uL (ref 3.9–10.3)
lymph#: 1.7 10*3/uL (ref 0.9–3.3)

## 2014-11-12 MED ORDER — DARBEPOETIN ALFA 300 MCG/0.6ML IJ SOSY
300.0000 ug | PREFILLED_SYRINGE | Freq: Once | INTRAMUSCULAR | Status: AC
Start: 2014-11-12 — End: 2014-11-12
  Administered 2014-11-12: 300 ug via SUBCUTANEOUS
  Filled 2014-11-12: qty 0.6

## 2014-12-03 ENCOUNTER — Other Ambulatory Visit (HOSPITAL_BASED_OUTPATIENT_CLINIC_OR_DEPARTMENT_OTHER): Payer: Medicare HMO

## 2014-12-03 ENCOUNTER — Ambulatory Visit (HOSPITAL_BASED_OUTPATIENT_CLINIC_OR_DEPARTMENT_OTHER): Payer: Medicare HMO

## 2014-12-03 VITALS — BP 152/56 | HR 63 | Temp 97.8°F

## 2014-12-03 DIAGNOSIS — N189 Chronic kidney disease, unspecified: Secondary | ICD-10-CM

## 2014-12-03 DIAGNOSIS — D631 Anemia in chronic kidney disease: Secondary | ICD-10-CM

## 2014-12-03 DIAGNOSIS — D61818 Other pancytopenia: Secondary | ICD-10-CM

## 2014-12-03 DIAGNOSIS — D649 Anemia, unspecified: Secondary | ICD-10-CM

## 2014-12-03 LAB — CBC WITH DIFFERENTIAL/PLATELET
BASO%: 0.6 % (ref 0.0–2.0)
Basophils Absolute: 0 10*3/uL (ref 0.0–0.1)
EOS ABS: 0.2 10*3/uL (ref 0.0–0.5)
EOS%: 3.5 % (ref 0.0–7.0)
HCT: 32.3 % — ABNORMAL LOW (ref 34.8–46.6)
HGB: 10.4 g/dL — ABNORMAL LOW (ref 11.6–15.9)
LYMPH%: 35.8 % (ref 14.0–49.7)
MCH: 35.3 pg — ABNORMAL HIGH (ref 25.1–34.0)
MCHC: 32.2 g/dL (ref 31.5–36.0)
MCV: 109.5 fL — ABNORMAL HIGH (ref 79.5–101.0)
MONO#: 0.7 10*3/uL (ref 0.1–0.9)
MONO%: 12.2 % (ref 0.0–14.0)
NEUT%: 47.9 % (ref 38.4–76.8)
NEUTROS ABS: 2.6 10*3/uL (ref 1.5–6.5)
NRBC: 0 % (ref 0–0)
Platelets: 61 10*3/uL — ABNORMAL LOW (ref 145–400)
RBC: 2.95 10*6/uL — ABNORMAL LOW (ref 3.70–5.45)
RDW: 14.6 % — AB (ref 11.2–14.5)
WBC: 5.4 10*3/uL (ref 3.9–10.3)
lymph#: 1.9 10*3/uL (ref 0.9–3.3)

## 2014-12-03 MED ORDER — DARBEPOETIN ALFA 300 MCG/0.6ML IJ SOSY
300.0000 ug | PREFILLED_SYRINGE | Freq: Once | INTRAMUSCULAR | Status: AC
  Administered 2014-12-03: 300 ug via SUBCUTANEOUS
  Filled 2014-12-03: qty 0.6

## 2014-12-24 ENCOUNTER — Other Ambulatory Visit: Payer: Medicare HMO

## 2014-12-24 ENCOUNTER — Ambulatory Visit: Payer: Medicare HMO

## 2014-12-24 ENCOUNTER — Other Ambulatory Visit (HOSPITAL_BASED_OUTPATIENT_CLINIC_OR_DEPARTMENT_OTHER): Payer: Medicare HMO

## 2014-12-24 ENCOUNTER — Ambulatory Visit (HOSPITAL_BASED_OUTPATIENT_CLINIC_OR_DEPARTMENT_OTHER): Payer: Medicare HMO | Admitting: Oncology

## 2014-12-24 ENCOUNTER — Telehealth: Payer: Self-pay | Admitting: Oncology

## 2014-12-24 VITALS — BP 131/67 | HR 58 | Temp 97.4°F | Resp 18 | Ht 68.0 in | Wt 142.7 lb

## 2014-12-24 DIAGNOSIS — D631 Anemia in chronic kidney disease: Secondary | ICD-10-CM | POA: Diagnosis not present

## 2014-12-24 DIAGNOSIS — D61818 Other pancytopenia: Secondary | ICD-10-CM

## 2014-12-24 DIAGNOSIS — D696 Thrombocytopenia, unspecified: Secondary | ICD-10-CM

## 2014-12-24 DIAGNOSIS — N189 Chronic kidney disease, unspecified: Secondary | ICD-10-CM

## 2014-12-24 DIAGNOSIS — D649 Anemia, unspecified: Secondary | ICD-10-CM

## 2014-12-24 LAB — CBC WITH DIFFERENTIAL/PLATELET
BASO%: 0.5 % (ref 0.0–2.0)
BASOS ABS: 0 10*3/uL (ref 0.0–0.1)
EOS%: 3.1 % (ref 0.0–7.0)
Eosinophils Absolute: 0.2 10*3/uL (ref 0.0–0.5)
HCT: 38.1 % (ref 34.8–46.6)
HEMOGLOBIN: 12.4 g/dL (ref 11.6–15.9)
LYMPH#: 1.7 10*3/uL (ref 0.9–3.3)
LYMPH%: 27.6 % (ref 14.0–49.7)
MCH: 35.5 pg — ABNORMAL HIGH (ref 25.1–34.0)
MCHC: 32.5 g/dL (ref 31.5–36.0)
MCV: 109.2 fL — ABNORMAL HIGH (ref 79.5–101.0)
MONO#: 0.6 10*3/uL (ref 0.1–0.9)
MONO%: 9.5 % (ref 0.0–14.0)
NEUT#: 3.7 10*3/uL (ref 1.5–6.5)
NEUT%: 59.3 % (ref 38.4–76.8)
Platelets: 68 10*3/uL — ABNORMAL LOW (ref 145–400)
RBC: 3.49 10*6/uL — AB (ref 3.70–5.45)
RDW: 13.8 % (ref 11.2–14.5)
WBC: 6.2 10*3/uL (ref 3.9–10.3)
nRBC: 0 % (ref 0–0)

## 2014-12-24 MED ORDER — DARBEPOETIN ALFA 300 MCG/0.6ML IJ SOSY: 300.0000 ug | PREFILLED_SYRINGE | Freq: Once | INTRAMUSCULAR | Status: DC

## 2014-12-24 NOTE — Telephone Encounter (Signed)
per pof to sch pt appt-gave pt copy of sch °

## 2014-12-24 NOTE — Progress Notes (Signed)
Hematology and Oncology Follow Up Visit  [name] [id number] [birth year] 74 y.o. 12/24/2014 11:31 AM [name], MDWilson, [name], MD   Principle Diagnosis: This is a 74 year old female with multifactorial anemia. She has anemia of chronic disease, anemia of renal disease, diagnosed in 2011.  Current therapy: Aranesp 300 mcg subcutaneously every 3 weeks keeping hemoglobin above 11.  Interim History:  [name] presents today for a followup visit with her husband. Since the last visit, she continues to do relatively well. She does report some occasional unsteadiness but no falls or syncope. Her kidney disease have progressed and likely will require dialysis at some point in the near future. She continues to tolerate Aranesp without any complications. She does report improvement in her quality of life and her energy. She had not had any injection or infusion problem and denied any toxicities. She has not noticed any bleeding or thrombosis. She reports no recent illnesses or hospitalizations. She has not reported any headaches or blurry vision or double vision. She does not report any chest pain shortness of breath or difficulty breathing. She does not report any nausea or vomiting or abdominal pain. She does not report any genitourinary complaints. She has not reported any bleeding or clotting tendencies. She has not reported any lymphadenopathy or petechiae. Rest of her review of systems unremarkable.  Medications: I have reviewed the patient's current medications.  Current Outpatient Prescriptions  Medication Sig Dispense Refill  . ALPRAZolam (XANAX) 0.5 MG tablet Take 0.5 mg by mouth daily.    [name] amLODipine (NORVASC) 5 MG tablet Take 1 tablet (5 mg total) by mouth daily. 30 tablet 11  . aspirin 81 MG tablet Take 81 mg by mouth daily.     . calcium acetate (PHOSLO) 667 MG capsule As directed    . carvedilol (COREG) 12.5 MG tablet TAKE 1 TABLET BY MOUTH TWICE A DAY 60 tablet 5  .  cholecalciferol (VITAMIN D) 1000 UNITS tablet Take 1,000 Units by mouth daily.    . CRESTOR 5 MG tablet TAKE 1 TABLET BY MOUTH EVERY DAY 90 tablet 1  . cyanocobalamin 1000 MCG tablet Take 100 mcg by mouth daily.    [name] FLUZONE HIGH-DOSE 0.5 ML SUSY   0  . furosemide (LASIX) 20 MG tablet As directed    . losartan (COZAAR) 25 MG tablet Take 1 tablet (25 mg total) by mouth daily. 90 tablet 3  . Multiple Vitamin (MULTIVITAMIN) capsule Take 1 capsule by mouth daily.    . niacin (NIASPAN) 1000 MG CR tablet Take 1,000 mg by mouth at bedtime.     . timolol (BETIMOL) 0.5 % ophthalmic solution 1 drop 2 (two) times daily.    [name] trimethoprim (TRIMPEX) 100 MG tablet As directed    . [DISCONTINUED] metoprolol succinate (TOPROL-XL) 50 MG 24 hr tablet Take 50 mg by mouth 2 (two) times daily. Take with or immediately following a meal.     No current facility-administered medications for this visit.     Allergies:  Allergies  Allergen Reactions  . Lipitor [Atorvastatin]     Stomach pain    Past Medical History, Surgical history, Social history, and Family History were reviewed and updated.    Physical Exam: Blood pressure 131/67, pulse 58, temperature 97.4 F (36.3 C), temperature source Oral, resp. rate 18, height 5\' 8"  (1.727 m), weight 142 lb 11.2 oz (64.728 kg), SpO2 99 %. ECOG: 1 General appearance: alert and cooperative Head: Normocephalic, without obvious abnormality, atraumatic Neck: no adenopathy  and no carotid bruit Lymph nodes: Cervical, supraclavicular, and axillary nodes normal. Heart:regular rate and rhythm, S1, S2 normal, no murmur, click, rub or gallop Lung:chest clear, no wheezing, rales, normal symmetric air entry, no tachypnea, retractions or cyanosis Abdomen: soft, non-tender, without masses or organomegaly EXT:no erythema, induration, or nodules   Lab Results: Lab Results  Component Value Date   WBC 6.2 12/24/2014   HGB 12.4 12/24/2014   HCT 38.1 12/24/2014   MCV 109.2*  12/24/2014   PLT 68* 12/24/2014     Chemistry      Component Value Date/Time   NA 134* 07/09/2014 0934   NA 138 12/20/2013 1103   K 5.1 07/09/2014 0934   K 4.6 12/20/2013 1103   CL 105 12/20/2013 1103   CL 101 08/16/2012 0757   CO2 21* 07/09/2014 0934   CO2 24 12/20/2013 1103   BUN 47.9* 07/09/2014 0934   BUN 29* 12/20/2013 1103   CREATININE 2.9* 07/09/2014 0934   CREATININE 2.4* 12/20/2013 1103      Component Value Date/Time   CALCIUM 9.6 07/09/2014 0934   CALCIUM 10.1 12/20/2013 1103   ALKPHOS 100 07/09/2014 0934   ALKPHOS 75 12/26/2011 0933   AST 24 07/09/2014 0934   AST 23 12/26/2011 0933   ALT 21 07/09/2014 0934   ALT 14 12/26/2011 0933   BILITOT 0.44 07/09/2014 0934   BILITOT 0.6 12/26/2011 0933     Impression and Plan:   74 year old female with the following issues:   1. 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 We will keep her on Aranesp, keep her hemoglobin above 11. Hemoglobin is 12.4 today and she will not receive any injections. We will continue Aranesp every 3 weeks to keep her hemoglobin above 11. Once hemodialysis starts, she will not require as injection appointments. 2. Thrombocytopenia. Her platelet count is stable. No major bleeding at this time. However, we will continue to monitor her blood counts closely.   3. Follow-up: CBC and Aranesp injection every 3 weeks. Visit in 6 months.       [id number] 5/11/201611:31 AM

## 2015-01-08 ENCOUNTER — Ambulatory Visit: Payer: Medicare HMO | Admitting: Cardiology

## 2015-01-09 ENCOUNTER — Encounter: Payer: Self-pay | Admitting: Vascular Surgery

## 2015-01-13 ENCOUNTER — Encounter: Payer: Self-pay | Admitting: Vascular Surgery

## 2015-01-13 ENCOUNTER — Ambulatory Visit (INDEPENDENT_AMBULATORY_CARE_PROVIDER_SITE_OTHER): Payer: Medicare HMO | Admitting: Vascular Surgery

## 2015-01-13 VITALS — BP 127/67 | HR 62 | Temp 97.8°F | Resp 14 | Ht 68.0 in | Wt 145.0 lb

## 2015-01-13 DIAGNOSIS — N185 Chronic kidney disease, stage 5: Secondary | ICD-10-CM | POA: Diagnosis not present

## 2015-01-13 NOTE — Progress Notes (Signed)
Subjective:     Patient ID: [name], female   DOB: [birth year], 74 y.o.   MRN: [id number]  HPI this 74 year old female was referred for a second opinion regarding vascular access. She has stage V chronic kidney disease. She is cared for by Dr. Audie Clear at [facility]. She has been evaluated by Dr. Elliot Gurney at [facility]. He recommended an AV graft in the left arm when patient was near needing hemodialysis. He did not feel the veins were large enough for fistula creation based on vein mapping. Patient is right-handed. She states she has been saving the veins in her left arm since she knew that she had kidney disease. She does have a history of ordering artery disease having previously undergone coronary artery bypass grafting by Dr. Tharon Aquas Trigt. She also has chronic anemia and is followed on a regular basis by hematology.  Past Medical History  Diagnosis Date  . Hypertension   . Hyperlipidemia   . Diabetes mellitus   . Coronary artery disease     Pt presented 2/10 to [facility] with NSTEMI and diastolic CHF exacerbation.  LHC was done  3/10 showing 99% pRCA stenosis and 80% calcified pLAD stenosis with L=>R collaterals.  Pt was referred  for CABG which was done by Dr. Prescott Gum with LIMA-LAD, SVG-RCA, SVG-OM.  [name] CKD (chronic kidney disease)      Last creatinine 1.4.  . Anxiety   . Chronic low back pain   . Anemia     Pt is taking iron.   . Mild aortic stenosis     mean gradient 12 mmHg in 2/12.  . Thrombocytopenia   . Carotid stenosis     40-59% bilateral ICA stenosis in 2/12.  . Diastolic CHF     Echo ([id number]) showed EF 55-65%, mild LVH, diastolic dysfunction, mild AS with mean gradient 12 mmHg, PASP 43 mmHg.  Echo (2/12): EF 55-60%, mild LVH, mild AS (mean gradient 12), PA systolic pressure 32 mmHg.       History  Substance Use Topics  . Smoking status: Former Smoker    Types: Cigarettes  . Smokeless tobacco: Former Systems developer    Quit date: 08/15/1988   Comment: quit 1988  . Alcohol Use: Yes     Comment: beer    Family History  Problem Relation Age of Onset  . Adopted: Yes    Allergies  Allergen Reactions  . Lipitor [Atorvastatin]     Stomach pain  . Strawberry Rash     Current outpatient prescriptions:  .  ALPRAZolam (XANAX) 0.5 MG tablet, Take 0.5 mg by mouth daily., Disp: , Rfl:  .  amLODipine (NORVASC) 5 MG tablet, Take 1 tablet (5 mg total) by mouth daily., Disp: 30 tablet, Rfl: 11 .  aspirin 81 MG tablet, Take 81 mg by mouth daily. , Disp: , Rfl:  .  calcium acetate (PHOSLO) 667 MG capsule, As directed, Disp: , Rfl:  .  carvedilol (COREG) 12.5 MG tablet, TAKE 1 TABLET BY MOUTH TWICE A DAY, Disp: 60 tablet, Rfl: 5 .  cholecalciferol (VITAMIN D) 1000 UNITS tablet, Take 1,000 Units by mouth daily., Disp: , Rfl:  .  CRESTOR 5 MG tablet, TAKE 1 TABLET BY MOUTH EVERY DAY, Disp: 90 tablet, Rfl: 1 .  FLUZONE HIGH-DOSE 0.5 ML SUSY, , Disp: , Rfl: 0 .  furosemide (LASIX) 20 MG tablet, As directed, Disp: , Rfl:  .  losartan (COZAAR) 25 MG tablet, Take 1 tablet (  25 mg total) by mouth daily., Disp: 90 tablet, Rfl: 3 .  Multiple Vitamin (MULTIVITAMIN) capsule, Take 1 capsule by mouth daily., Disp: , Rfl:  .  niacin (NIASPAN) 1000 MG CR tablet, Take 1,000 mg by mouth at bedtime. , Disp: , Rfl:  .  timolol (BETIMOL) 0.5 % ophthalmic solution, 1 drop 2 (two) times daily., Disp: , Rfl:  .  trimethoprim (TRIMPEX) 100 MG tablet, Take 50 mg by mouth daily. As directed, Disp: , Rfl:  .  [DISCONTINUED] metoprolol succinate (TOPROL-XL) 50 MG 24 hr tablet, Take 50 mg by mouth 2 (two) times daily. Take with or immediately following a meal., Disp: , Rfl:   Filed Vitals:   01/13/15 1056  BP: 127/67  Pulse: 62  Temp: 97.8 F (36.6 C)  TempSrc: Oral  Resp: 14  Height: 5\' 8"  (1.727 m)  Weight: 145 lb (65.772 kg)  SpO2: 97%    Body mass index is 22.05 kg/(m^2).         Review of Systems denies active chest pain   or dyspnea on  exertion. Patient does have easy bruisability due to anemia and low platelets at times. History of coronary artery bypass grafting as noted above. Objective:   Physical Exam BP 127/67 mmHg  Pulse 62  Temp(Src) 97.8 F (36.6 C) (Oral)  Resp 14  Ht 5\' 8"  (1.727 m)  Wt 145 lb (65.772 kg)  BMI 22.05 kg/m2  SpO2 97%  Gen.-alert and oriented x3 in no apparent distress HEENT normal for age Lungs no rhonchi or wheezing Cardiovascular regular rhythm no murmurs carotid pulses 3+ palpable no bruits audible Abdomen soft nontender no palpable masses Musculoskeletal free of  major deformities Skin clear -no rashes-a few ecchymosis in the upper extremities noted. Neurologic normal Lower extremities 3+ femoral and dorsalis pedis pulses palpable bilaterally with no edema 3+ brachial 2+ radial pulses palpable bilaterally. Left cephalic vein appears adequate in the forearm for fistula creation except the very distal aspect does taper down to a smaller caliber  Today I reviewed the vein mapping results performed at [facility] which seemed to indicate that the cephalic vein in the left arm is not satisfactory and the forearm due to small caliber. Left basilic vein does appear to be possibly adequate  Today I performed a bedside sonosite  ultrasound exam with tourniquet in place. It appears to me that the cephalic vein may be adequate for fistula creation and if not a basilic vein transposition should be attempted and may well be satisfactory.       Assessment:     Chronic kidney disease-stage V needs vascular access-second opinion performed today Coronary artery disease status post coronary artery bypass grafting Chronic anemia    Plan:     I would recommend attempting left radial-cephalic AV fistula initially. The caliber of the vein does tapers slightly in the very distal aspect of the left forearm but this may be a satisfactory site for fistula creation. If this is not successful would then  recommend proceeding with left basilic vein transposition Patient will notify us if she would like to schedule this in the near future after discussing with her nephrologist    r

## 2015-01-14 ENCOUNTER — Other Ambulatory Visit (HOSPITAL_BASED_OUTPATIENT_CLINIC_OR_DEPARTMENT_OTHER): Payer: Medicare HMO

## 2015-01-14 ENCOUNTER — Ambulatory Visit (HOSPITAL_BASED_OUTPATIENT_CLINIC_OR_DEPARTMENT_OTHER): Payer: Medicare HMO

## 2015-01-14 VITALS — BP 139/57 | HR 60 | Temp 97.8°F

## 2015-01-14 DIAGNOSIS — D631 Anemia in chronic kidney disease: Secondary | ICD-10-CM | POA: Diagnosis not present

## 2015-01-14 DIAGNOSIS — D649 Anemia, unspecified: Secondary | ICD-10-CM

## 2015-01-14 DIAGNOSIS — D696 Thrombocytopenia, unspecified: Secondary | ICD-10-CM

## 2015-01-14 DIAGNOSIS — N189 Chronic kidney disease, unspecified: Secondary | ICD-10-CM | POA: Diagnosis not present

## 2015-01-14 LAB — CBC WITH DIFFERENTIAL/PLATELET
BASO%: 0.3 % (ref 0.0–2.0)
Basophils Absolute: 0 10*3/uL (ref 0.0–0.1)
EOS ABS: 0.1 10*3/uL (ref 0.0–0.5)
EOS%: 1.8 % (ref 0.0–7.0)
HCT: 27.4 % — ABNORMAL LOW (ref 34.8–46.6)
HEMOGLOBIN: 9.3 g/dL — AB (ref 11.6–15.9)
LYMPH%: 27.5 % (ref 14.0–49.7)
MCH: 35.1 pg — ABNORMAL HIGH (ref 25.1–34.0)
MCHC: 33.9 g/dL (ref 31.5–36.0)
MCV: 103.4 fL — ABNORMAL HIGH (ref 79.5–101.0)
MONO#: 0.6 10*3/uL (ref 0.1–0.9)
MONO%: 8.7 % (ref 0.0–14.0)
NEUT#: 4.4 10*3/uL (ref 1.5–6.5)
NEUT%: 61.7 % (ref 38.4–76.8)
Platelets: 64 10*3/uL — ABNORMAL LOW (ref 145–400)
RBC: 2.65 10*6/uL — ABNORMAL LOW (ref 3.70–5.45)
RDW: 12.6 % (ref 11.2–14.5)
WBC: 7.1 10*3/uL (ref 3.9–10.3)
lymph#: 2 10*3/uL (ref 0.9–3.3)
nRBC: 0 % (ref 0–0)

## 2015-01-14 MED ORDER — DARBEPOETIN ALFA 300 MCG/0.6ML IJ SOSY
300.0000 ug | PREFILLED_SYRINGE | Freq: Once | INTRAMUSCULAR | Status: AC
  Administered 2015-01-14: 300 ug via SUBCUTANEOUS
  Filled 2015-01-14: qty 0.6

## 2015-01-15 ENCOUNTER — Other Ambulatory Visit: Payer: Self-pay | Admitting: Cardiology

## 2015-01-15 DIAGNOSIS — I6523 Occlusion and stenosis of bilateral carotid arteries: Secondary | ICD-10-CM

## 2015-01-19 ENCOUNTER — Encounter: Payer: Self-pay | Admitting: *Deleted

## 2015-01-20 ENCOUNTER — Ambulatory Visit (HOSPITAL_COMMUNITY): Payer: Medicare HMO | Attending: Internal Medicine

## 2015-01-20 ENCOUNTER — Encounter: Payer: Self-pay | Admitting: Nurse Practitioner

## 2015-01-20 ENCOUNTER — Ambulatory Visit (INDEPENDENT_AMBULATORY_CARE_PROVIDER_SITE_OTHER): Payer: Medicare HMO | Admitting: Nurse Practitioner

## 2015-01-20 ENCOUNTER — Encounter: Payer: Self-pay | Admitting: Otolaryngology

## 2015-01-20 VITALS — BP 100/56 | HR 61 | Ht 68.0 in | Wt 145.4 lb

## 2015-01-20 DIAGNOSIS — I6523 Occlusion and stenosis of bilateral carotid arteries: Secondary | ICD-10-CM | POA: Insufficient documentation

## 2015-01-20 DIAGNOSIS — I350 Nonrheumatic aortic (valve) stenosis: Secondary | ICD-10-CM

## 2015-01-20 DIAGNOSIS — I259 Chronic ischemic heart disease, unspecified: Secondary | ICD-10-CM | POA: Diagnosis not present

## 2015-01-20 DIAGNOSIS — R29818 Other symptoms and signs involving the nervous system: Secondary | ICD-10-CM

## 2015-01-20 DIAGNOSIS — R2689 Other abnormalities of gait and mobility: Secondary | ICD-10-CM

## 2015-01-20 DIAGNOSIS — E785 Hyperlipidemia, unspecified: Secondary | ICD-10-CM | POA: Diagnosis not present

## 2015-01-20 DIAGNOSIS — I10 Essential (primary) hypertension: Secondary | ICD-10-CM

## 2015-01-20 LAB — HEPATIC FUNCTION PANEL
ALT: 12 U/L (ref 0–35)
AST: 14 U/L (ref 0–37)
Albumin: 4.2 g/dL (ref 3.5–5.2)
Alkaline Phosphatase: 69 U/L (ref 39–117)
Bilirubin, Direct: 0.2 mg/dL (ref 0.0–0.3)
Total Bilirubin: 0.5 mg/dL (ref 0.2–1.2)
Total Protein: 6.9 g/dL (ref 6.0–8.3)

## 2015-01-20 LAB — LIPID PANEL
Cholesterol: 144 mg/dL (ref 0–200)
HDL: 87.1 mg/dL (ref >39.00)
LDL Cholesterol: 44 mg/dL (ref 0–99)
NonHDL: 56.9
Total CHOL/HDL Ratio: 2
Triglycerides: 67 mg/dL (ref 0.0–149.0)
VLDL: 13.4 mg/dL (ref 0.0–40.0)

## 2015-01-20 MED ORDER — ROSUVASTATIN CALCIUM 5 MG PO TABS: 5.0000 mg | ORAL_TABLET | Freq: Every day | ORAL | Status: DC

## 2015-01-20 MED ORDER — CARVEDILOL 12.5 MG PO TABS: 12.5000 mg | ORAL_TABLET | Freq: Two times a day (BID) | ORAL | Status: DC

## 2015-01-20 MED ORDER — AMLODIPINE BESYLATE 5 MG PO TABS: 5.0000 mg | ORAL_TABLET | Freq: Every day | ORAL | Status: DC

## 2015-01-20 NOTE — Progress Notes (Signed)
CARDIOLOGY OFFICE NOTE  Date:  01/20/2015    [name] Date of Birth: Jan 10, 1941 Medical Record [id number]  PCP:  [name], MD  Cardiologist:  Aundra Dubin    Chief Complaint  Patient presents with  . Coronary Artery Disease    Annual follow up - seen for Dr. Aundra Dubin    History of Present Illness: [name] is a 74 y.o. female who presents today for a one year check. Seen for Dr. Aundra Dubin. She has a h/o CAD s/p CABG and diastolic CHF. She has chronic anemia, balance issues, HTN, DM, HLD, CKD, chronic back pain, anxiety and known valvular heart disease.   Last seen here a little over a year ago - had had her echo updated - mild AS. She was felt to be doing well. Balance seemed to be her main issue and she was referred to neurology.   Comes back today. Here with her husband. For carotid doppler after today's visit. Balance still an issue. Seems frail. Using a cane. 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 She is followed by Nephrology in [place] - has seen Dr. Kellie Simmering for AV fistula consult. Kidneys are "getting worse" and dialysis may be in her future. No chest pain. Breathing is good. No falls. Tolerating her medicines. PCP has wanted to stop her Niaspan.   Past Medical History:  1. CAD: Pt presented 2/10 to [facility] with NSTEMI and diastolic CHF exacerbation. LHC was done 3/10 showing 99% pRCA stenosis and 80% calcified pLAD stenosis with L=>R collaterals. Pt was referred for CABG which was done by Dr. Prescott Gum with LIMA-LAD, SVG-RCA, SVG-OM.  2. Diastolic CHF: Echo ([id number]) showed EF 55-65%, mild LVH, diastolic dysfunction, mild AS with mean gradient 12 mmHg, PASP 43 mmHg. Echo (2/12): EF 55-60%, mild LVH, mild AS (mean gradient 12), PA systolic pressure 32 mmHg. Echo (5/15) with EF 55%, mild MR, mild aortic stenosis.  3. CKD 4. DM2  5. HTN  6. Hyperlipidemia: nausea/GI discomfort with atorvastatin, ? Balance problems  with pravastatin.  7. Anxiety  8. Chronic low back pain  9. History of thrombocytopenia  10. 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 Followed by hematology, getting Aranesp injections 11. Aortic stenosis: mean gradient 12 mmHg in 2/12. Mean gradient 18 mmHg with AVA 1.6 cm^2 in 5/15.  12. Carotid stenosis: 40-59% bilateral ICA stenosis in [id number]. 123456 LICA stenosis in [id number]. Carotids (0000000) with 123456 LICA stenosis.  13. PAD: ABIs 3/12 were normal but TBIs were mildly decreased. ABIs/TBIs (10/13) normal.     Past Medical History  Diagnosis Date  . Hypertension   . Hyperlipidemia   . Diabetes mellitus   . Coronary artery disease     Pt presented 2/10 to [facility] with NSTEMI and diastolic CHF exacerbation.  LHC was done  3/10 showing 99% pRCA stenosis and 80% calcified pLAD stenosis with L=>R collaterals.  Pt was referred  for CABG which was done by Dr. Prescott Gum with LIMA-LAD, SVG-RCA, SVG-OM.  [name] CKD (chronic kidney disease)      Last creatinine 1.4.  . Anxiety   . Chronic low back pain   . Anemia     Pt is taking iron.   . Mild aortic stenosis     mean gradient 12 mmHg in 2/12.  . Thrombocytopenia   . Carotid stenosis     40-59% bilateral ICA stenosis in 2/12.  . Diastolic  CHF     Echo (2/10) showed EF 55-65%, mild LVH, diastolic dysfunction, mild AS with mean gradient 12 mmHg, PASP 43 mmHg.  Echo (2/12): EF 55-60%, mild LVH, mild AS (mean gradient 12), PA systolic pressure 32 mmHg.       Past Surgical History  Procedure Laterality Date  . Coronary artery bypass graft  09/2008    pt with NSTEMI and diastolic CHF exacerbation.  LHC was done  3/10 showing 99% pRCA stenosis and 80% calcified pLAD stenosis with L=>R collaterals.  Pt was referred  for CABG which was done by Dr. Prescott Gum with LIMA-LAD, SVG-RCA, SVG-OM.  [name] Back surgery      multiple     Medications: Current Outpatient Prescriptions  Medication Sig Dispense Refill  . ALPRAZolam (XANAX) 0.5  MG tablet Take 0.5 mg by mouth daily.    [name] amLODipine (NORVASC) 5 MG tablet Take 1 tablet (5 mg total) by mouth daily. 90 tablet 3  . aspirin 81 MG tablet Take 81 mg by mouth daily.     . calcium acetate (PHOSLO) 667 MG capsule As directed    . carvedilol (COREG) 12.5 MG tablet Take 1 tablet (12.5 mg total) by mouth 2 (two) times daily. 180 tablet 3  . cholecalciferol (VITAMIN D) 1000 UNITS tablet Take 1,000 Units by mouth daily.    [name] FLUZONE HIGH-DOSE 0.5 ML SUSY   0  . furosemide (LASIX) 20 MG tablet As directed    . losartan (COZAAR) 25 MG tablet Take 1 tablet (25 mg total) by mouth daily. 90 tablet 3  . rosuvastatin (CRESTOR) 5 MG tablet Take 1 tablet (5 mg total) by mouth daily. 90 tablet 3  . trimethoprim (TRIMPEX) 100 MG tablet Take 50 mg by mouth daily. As directed    . [DISCONTINUED] metoprolol succinate (TOPROL-XL) 50 MG 24 hr tablet Take 50 mg by mouth 2 (two) times daily. Take with or immediately following a meal.     No current facility-administered medications for this visit.    Allergies: Allergies  Allergen Reactions  . Lipitor [Atorvastatin]     Stomach pain  . Strawberry Rash    Social History: The patient  reports that she has quit smoking. Her smoking use included Cigarettes. She quit smokeless tobacco use about 26 years ago. She reports that she drinks alcohol. She reports that she does not use illicit drugs.   Family History: The patient's She was adopted. Family history is unknown by patient.   Review of Systems: Please see the history of present illness.   Otherwise, the review of systems is positive for none.   All other systems are reviewed and negative.   Physical Exam: VS:  BP 100/56 mmHg  Pulse 61  Ht 5\' 8"  (1.727 m)  Wt 145 lb 6.4 oz (65.953 kg)  BMI 22.11 kg/m2 .  BMI Body mass index is 22.11 kg/(m^2).  Wt Readings from Last 3 Encounters:  01/20/15 145 lb 6.4 oz (65.953 kg)  01/13/15 145 lb (65.772 kg)  12/24/14 142 lb 11.2 oz (64.728 kg)     General: Pleasant. Well developed, well nourished and in no acute distress.  HEENT: Normal. Neck: Supple, no JVD, carotid bruits, or masses noted.  Cardiac: Regular rate and rhythm. Soft outflow murmur.  No edema.  Respiratory:  Lungs are clear to auscultation bilaterally with normal work of breathing.  GI: Soft and nontender.  MS: No deformity or atrophy. Gait and ROM intact. Skin: Warm and dry. Color is quite  pale. Lots of bruising on her arms and legs. Neuro:  Strength and sensation are intact and no gross focal deficits noted.  Psych: Alert, appropriate and with normal affect.   LABORATORY DATA:  EKG:  EKG is ordered today. This demonstrates NSR - prior septal infarct.  Lab Results  Component Value Date   WBC 7.1 01/14/2015   HGB 9.3* 01/14/2015   HCT 27.4* 01/14/2015   PLT 64* 01/14/2015   GLUCOSE 136 07/09/2014   CHOL 120 12/20/2013   TRIG 128.0 12/20/2013   HDL 65.60 12/20/2013   LDLDIRECT 34.4 11/17/2008   LDLCALC 29 12/20/2013   ALT 21 07/09/2014   AST 24 07/09/2014   NA 134* 07/09/2014   K 5.1 07/09/2014   CL 105 12/20/2013   CREATININE 2.9* 07/09/2014   BUN 47.9* 07/09/2014   CO2 21* 07/09/2014   TSH 2.185 01/17/2014   INR 1.4 10/16/2008   HGBA1C * 10/14/2008    6.5 (NOTE)   The ADA recommends the following therapeutic goal for glycemic   control related to Hgb A1C measurement:   Goal of Therapy:   < 7.0% Hgb A1C   Reference: American Diabetes Association: Clinical Practice   Recommendations 2008, Diabetes Care,  2008, 31:(Suppl 1).    BNP (last 3 results) No results for input(s): BNP in the last 8760 hours.  ProBNP (last 3 results) No results for input(s): PROBNP in the last 8760 hours.   Other Studies Reviewed Today:  Echo Study Conclusions 12/2013  - Left ventricle: The cavity size was normal. Wall thickness was normal. The estimated ejection fraction was 55%. Wall motion was normal; there were no regional wall motion abnormalities.  Doppler parameters are consistent with abnormal left ventricular relaxation (grade 1 diastolic dysfunction). - Aortic valve: Trileaflet; moderately calcified leaflets. There was mild stenosis. Mean gradient: 7mm Hg (S). Peak gradient: 10mm Hg (S). Valve area: 1.61cm^2 (Vmax). - Mitral valve: Mildly calcified annulus. Normal thickness leaflets . Mild regurgitation. - Left atrium: The atrium was mildly dilated. - Right ventricle: The cavity size was normal. Systolic function was normal. - Tricuspid valve: Peak RV-RA gradient: 79mm Hg (S). - Pulmonary arteries: PA peak pressure: 50mm Hg (S). - Inferior vena cava: The vessel was normal in size; the respirophasic diameter changes were in the normal range (= 50%); findings are consistent with normal central venous pressure. Impressions:  - Normal LV size and systolic function, EF [id number]. Normal RV size and systolic function. Mild aortic stenosis. Mild MR.  Assessment/Plan:  1. CAD - Status post CABG. No ischemic symptoms. Continue ASA 81, Coreg, ARB, and statin.  2. AORTIC STENOSIS  Mild aortic stenosis. Stable murmur and no symptoms - would repeat study next year  3. CAROTID ARTERY STENOSIS - getting dopplers today.  4. HYPERTENSION - BP great on current regimen. Not dizzy or lightheaded.   5. Hyperlipidemia - Check lipids today. I have stopped the Niaspan due to recent studies.   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   7. Progressive CKD - seeing Renal tomorrow (in [place]) with labs. Probably headed toward dialysis in the future.   8. Anemia - I suspect this is from her kidney disease.  Current medicines are reviewed with the patient today.  The patient does not have concerns regarding medicines other than what has been noted above.  The following changes have been made:  See above.   Labs/ tests ordered today include:    Orders Placed This Encounter  Procedures  .  Hepatic  function panel  . Lipid panel  . EKG 12-Lead     Disposition:   She would like to see me in a year.    Patient is agreeable to this plan and will call if any problems develop in the interim.   Signed: [name], RN, ANP-C 01/20/2015 9:36 AM  [facility] [address] [place], [place]  [address] Phone: [phone] Fax: [phone]

## 2015-01-20 NOTE — Patient Instructions (Addendum)
We will be checking the following labs today - Lipids and HPF   Medication Instructions:    Continue with your current medicines but  I am stopping Niaspan  I have refilled the Crestor, Coreg and Norvasc    Testing/Procedures To Be Arranged:  N/A  Follow-Up:   See me in one year    Other Special Instructions:   We will get your carotid doppler study done today  Call the [place] office at [phone] if you have any questions, problems or concerns.

## 2015-01-22 ENCOUNTER — Other Ambulatory Visit: Payer: Self-pay

## 2015-01-29 ENCOUNTER — Encounter (HOSPITAL_COMMUNITY): Payer: Self-pay | Admitting: *Deleted

## 2015-01-29 MED ORDER — DEXTROSE 5 % IV SOLN
1.5000 g | INTRAVENOUS | Status: AC
  Administered 2015-01-30: 1.5 g via INTRAVENOUS
  Filled 2015-01-29: qty 1.5

## 2015-01-29 MED ORDER — SODIUM CHLORIDE 0.9 % IV SOLN
INTRAVENOUS | Status: DC
  Administered 2015-01-30: 07:00:00 via INTRAVENOUS

## 2015-01-29 NOTE — Progress Notes (Signed)
Anesthesia Chart Review: SAME DAY WORK-UP.  Patient is a 74 year old female scheduled for creation of a left radiocephalic AVF tomorrow by Dr. Kellie Simmering.  History includes former smoker, HTN, HLD, CAD/NSTEMI '10 s/p CABG (LIMA-LAD, SVG-RCA, SVG-OM), DM2, CKD V not yet on HD, anemia (on Aranesp), thrombocytopenia (etiology not specified), carotid artery stenosis, diastolic CHF, mild AS (123456 echo), anxiety, unsteady gait. Drinks beer.   PCP is Dr. Kathryne Eriksson. Nephrologist is Dr. Audie Clear with [facility] in [facility]. Hematologist is Dr. Zola Button, last visit 12/24/14. Labs were felt stable. I don't see a specific etiology mentioned for her thrombocytopenia, other than a comment stating that "The likelihood of possibly developing myelodysplastic syndrome is always a possibility." He is checking a CBC on her ~ every three weeks.  Cardiologist is Dr. Aundra Dubin with last cardiology visit 01/20/15 with Truitt Merle, NP with one year follow-up recommended.   01/20/15 EKG: NSR, low voltage QRS, septal infarct (age undetermined).  12/20/13 Echo: Normal LV size and systolic function, EF [id number]. Normal RVsize and systolic function. Mild MR. Mild aortic stenosis (Mean gradient: 80mm Hg (S). Peakgradient: 75mm Hg (S). Valve area: 1.61cm^2 (Vmax).).  Last LHC was 10/13/08 prior to her CABG.  01/20/15 Carotid duplex: 40-59% BICA stenosis.  She is for labs (ISTAT4) on the day of surgery. Her PLT count has primarily been in the 60-80K range over the past 3 years, and has consistently been in the 60K range since 10/2014.  Last CBC 01/14/15 showed H/H 9.3/27.4, PLT 64K. HFP yesterday was WNL. I'll add a PLT count to tomorrow's labs to ensure stability. I also sent a staff message to Dr. Kellie Simmering and [facility] RNs Arbie Cookey and Colletta Maryland to ensure Dr. Kellie Simmering was aware of patient's known thrombocytopenia with most recent results in the 60K range. Since this is a chronic issues and EBL should be low for this case, I would anticipate  that she could proceed as planned if Dr. Kellie Simmering feels labs are acceptable and otherwise there are no acute changes.  George Hugh [facility]/Anesthesiology Phone [phone] 01/29/2015 11:23 AM

## 2015-01-29 NOTE — Progress Notes (Signed)
Spoke with Dr. Kellie Simmering regarding patients thrombocytopenia with most recent platelet counts in 60K range. Dr. Kellie Simmering acknowledged and wants to proceed with planned surgery on 01/30/15. No new orders given.

## 2015-01-29 NOTE — Progress Notes (Signed)
Pt denies SOB and chest pain but is under the care of Dr. Marlyce Huge, cardiology. Pt made aware to stop taking otc vitamins, herbal medications and NSAID's. Pt chart forwarded to [place], [place], anesthesia to review pt history. Pt verbalized understanding of all pre-op instructions.

## 2015-01-30 ENCOUNTER — Encounter (HOSPITAL_COMMUNITY)
Admission: RE | Disposition: A | Discharge status: Home / Self Care | Payer: Self-pay | Source: Ambulatory Visit | Attending: Vascular Surgery

## 2015-01-30 ENCOUNTER — Encounter (HOSPITAL_COMMUNITY): Payer: Self-pay | Admitting: *Deleted

## 2015-01-30 ENCOUNTER — Telehealth: Payer: Self-pay | Admitting: Vascular Surgery

## 2015-01-30 ENCOUNTER — Ambulatory Visit (HOSPITAL_COMMUNITY): Payer: Medicare HMO | Admitting: Vascular Surgery

## 2015-01-30 ENCOUNTER — Other Ambulatory Visit: Payer: Self-pay

## 2015-01-30 ENCOUNTER — Ambulatory Visit (HOSPITAL_COMMUNITY)
Admission: RE | Admit: 2015-01-30 | Discharge: 2015-01-30 | Disposition: A | Discharge status: Home / Self Care | Payer: Medicare HMO | Source: Ambulatory Visit | Attending: Vascular Surgery | Admitting: Vascular Surgery

## 2015-01-30 DIAGNOSIS — Z79899 Other long term (current) drug therapy: Secondary | ICD-10-CM | POA: Insufficient documentation

## 2015-01-30 DIAGNOSIS — I2510 Atherosclerotic heart disease of native coronary artery without angina pectoris: Secondary | ICD-10-CM | POA: Insufficient documentation

## 2015-01-30 DIAGNOSIS — N186 End stage renal disease: Secondary | ICD-10-CM | POA: Insufficient documentation

## 2015-01-30 DIAGNOSIS — I739 Peripheral vascular disease, unspecified: Secondary | ICD-10-CM | POA: Insufficient documentation

## 2015-01-30 DIAGNOSIS — E785 Hyperlipidemia, unspecified: Secondary | ICD-10-CM | POA: Insufficient documentation

## 2015-01-30 DIAGNOSIS — I120 Hypertensive chronic kidney disease with stage 5 chronic kidney disease or end stage renal disease: Secondary | ICD-10-CM | POA: Diagnosis not present

## 2015-01-30 DIAGNOSIS — K219 Gastro-esophageal reflux disease without esophagitis: Secondary | ICD-10-CM | POA: Insufficient documentation

## 2015-01-30 DIAGNOSIS — D649 Anemia, unspecified: Secondary | ICD-10-CM | POA: Insufficient documentation

## 2015-01-30 DIAGNOSIS — E119 Type 2 diabetes mellitus without complications: Secondary | ICD-10-CM | POA: Diagnosis not present

## 2015-01-30 DIAGNOSIS — D696 Thrombocytopenia, unspecified: Secondary | ICD-10-CM | POA: Diagnosis not present

## 2015-01-30 DIAGNOSIS — Z7982 Long term (current) use of aspirin: Secondary | ICD-10-CM | POA: Diagnosis not present

## 2015-01-30 DIAGNOSIS — M545 Low back pain: Secondary | ICD-10-CM | POA: Diagnosis not present

## 2015-01-30 DIAGNOSIS — Z951 Presence of aortocoronary bypass graft: Secondary | ICD-10-CM | POA: Diagnosis not present

## 2015-01-30 DIAGNOSIS — I5030 Unspecified diastolic (congestive) heart failure: Secondary | ICD-10-CM | POA: Insufficient documentation

## 2015-01-30 DIAGNOSIS — I350 Nonrheumatic aortic (valve) stenosis: Secondary | ICD-10-CM | POA: Insufficient documentation

## 2015-01-30 DIAGNOSIS — I252 Old myocardial infarction: Secondary | ICD-10-CM | POA: Diagnosis not present

## 2015-01-30 DIAGNOSIS — Z792 Long term (current) use of antibiotics: Secondary | ICD-10-CM | POA: Diagnosis not present

## 2015-01-30 DIAGNOSIS — G8929 Other chronic pain: Secondary | ICD-10-CM | POA: Insufficient documentation

## 2015-01-30 DIAGNOSIS — Z87891 Personal history of nicotine dependence: Secondary | ICD-10-CM | POA: Diagnosis not present

## 2015-01-30 DIAGNOSIS — Z48812 Encounter for surgical aftercare following surgery on the circulatory system: Secondary | ICD-10-CM

## 2015-01-30 DIAGNOSIS — N184 Chronic kidney disease, stage 4 (severe): Secondary | ICD-10-CM | POA: Diagnosis not present

## 2015-01-30 DIAGNOSIS — F419 Anxiety disorder, unspecified: Secondary | ICD-10-CM | POA: Diagnosis not present

## 2015-01-30 HISTORY — DX: Unsteadiness on feet: R26.81

## 2015-01-30 HISTORY — DX: Unspecified osteoarthritis, unspecified site: M19.90

## 2015-01-30 HISTORY — DX: Other specified postprocedural states: Z98.890

## 2015-01-30 HISTORY — DX: Gastro-esophageal reflux disease without esophagitis: K21.9

## 2015-01-30 HISTORY — DX: Cardiac murmur, unspecified: R01.1

## 2015-01-30 HISTORY — PX: AV FISTULA PLACEMENT: SHX1204

## 2015-01-30 HISTORY — DX: Acute myocardial infarction, unspecified: I21.9

## 2015-01-30 HISTORY — DX: Type 2 diabetes mellitus with diabetic neuropathy, unspecified: E11.40

## 2015-01-30 HISTORY — DX: Pneumonia, unspecified organism: J18.9

## 2015-01-30 HISTORY — DX: Nausea with vomiting, unspecified: R11.2

## 2015-01-30 LAB — GLUCOSE, CAPILLARY
Glucose-Capillary: 100 mg/dL — ABNORMAL HIGH (ref 65–99)
Glucose-Capillary: 106 mg/dL — ABNORMAL HIGH (ref 65–99)

## 2015-01-30 LAB — POCT I-STAT 4, (NA,K, GLUC, HGB,HCT)
Glucose, Bld: 102 mg/dL — ABNORMAL HIGH (ref 65–99)
HCT: 30 % — ABNORMAL LOW (ref 36.0–46.0)
Hemoglobin: 10.2 g/dL — ABNORMAL LOW (ref 12.0–15.0)
Potassium: 4.2 mmol/L (ref 3.5–5.1)
SODIUM: 131 mmol/L — AB (ref 135–145)

## 2015-01-30 LAB — PLATELET COUNT: PLATELETS: 78 10*3/uL — AB (ref 150–400)

## 2015-01-30 SURGERY — ARTERIOVENOUS (AV) FISTULA CREATION
Anesthesia: Monitor Anesthesia Care | Site: Arm Lower | Laterality: Left

## 2015-01-30 MED ORDER — FENTANYL CITRATE (PF) 100 MCG/2ML IJ SOLN
INTRAMUSCULAR | Status: DC | PRN
  Administered 2015-01-30 (×2): 50 ug via INTRAVENOUS

## 2015-01-30 MED ORDER — PROPOFOL INFUSION 10 MG/ML OPTIME
INTRAVENOUS | Status: DC | PRN
  Administered 2015-01-30: 35 ug/kg/min via INTRAVENOUS
  Administered 2015-01-30: 25 ug/kg/min via INTRAVENOUS

## 2015-01-30 MED ORDER — PROPOFOL 10 MG/ML IV BOLUS
INTRAVENOUS | Status: AC
  Filled 2015-01-30: qty 40

## 2015-01-30 MED ORDER — CHLORHEXIDINE GLUCONATE CLOTH 2 % EX PADS: 6.0000 | MEDICATED_PAD | Freq: Once | CUTANEOUS | Status: DC

## 2015-01-30 MED ORDER — SODIUM CHLORIDE 0.9 % IR SOLN
Status: DC | PRN
  Administered 2015-01-30: 500 mL

## 2015-01-30 MED ORDER — PROMETHAZINE HCL 25 MG/ML IJ SOLN: 6.2500 mg | INTRAMUSCULAR | Status: DC | PRN

## 2015-01-30 MED ORDER — LIDOCAINE-EPINEPHRINE (PF) 1 %-1:200000 IJ SOLN
INTRAMUSCULAR | Status: AC
  Filled 2015-01-30: qty 10

## 2015-01-30 MED ORDER — FENTANYL CITRATE (PF) 250 MCG/5ML IJ SOLN
INTRAMUSCULAR | Status: AC
  Filled 2015-01-30: qty 5

## 2015-01-30 MED ORDER — HYDROCODONE-ACETAMINOPHEN 7.5-325 MG PO TABS: 1.0000 | ORAL_TABLET | Freq: Once | ORAL | Status: DC | PRN

## 2015-01-30 MED ORDER — 0.9 % SODIUM CHLORIDE (POUR BTL) OPTIME
TOPICAL | Status: DC | PRN
  Administered 2015-01-30: 1000 mL

## 2015-01-30 MED ORDER — HYDROMORPHONE HCL 1 MG/ML IJ SOLN: 0.2500 mg | INTRAMUSCULAR | Status: DC | PRN

## 2015-01-30 MED ORDER — OXYCODONE HCL 5 MG PO TABS: 5.0000 mg | ORAL_TABLET | Freq: Four times a day (QID) | ORAL | Status: DC | PRN

## 2015-01-30 SURGICAL SUPPLY — 34 items
ARMBAND PINK RESTRICT EXTREMIT (MISCELLANEOUS) ×2 IMPLANT
CANISTER SUCTION 2500CC (MISCELLANEOUS) ×2 IMPLANT
CLIP TI MEDIUM 6 (CLIP) ×2 IMPLANT
CLIP TI WIDE RED SMALL 6 (CLIP) ×2 IMPLANT
COVER PROBE W GEL 5X96 (DRAPES) IMPLANT
DERMABOND ADHESIVE PROPEN (GAUZE/BANDAGES/DRESSINGS) ×1
DERMABOND ADVANCED .7 DNX6 (GAUZE/BANDAGES/DRESSINGS) ×1 IMPLANT
DRAIN PENROSE 1/4X12 LTX STRL (WOUND CARE) ×2 IMPLANT
ELECT REM PT RETURN 9FT ADLT (ELECTROSURGICAL) ×2
ELECTRODE REM PT RTRN 9FT ADLT (ELECTROSURGICAL) ×1 IMPLANT
GEL ULTRASOUND 20GR AQUASONIC (MISCELLANEOUS) IMPLANT
GLOVE BIO SURGEON STRL SZ 6.5 (GLOVE) ×2 IMPLANT
GLOVE BIO SURGEON STRL SZ7.5 (GLOVE) ×2 IMPLANT
GLOVE BIOGEL PI IND STRL 7.0 (GLOVE) ×1 IMPLANT
GLOVE BIOGEL PI IND STRL 7.5 (GLOVE) ×1 IMPLANT
GLOVE BIOGEL PI INDICATOR 7.0 (GLOVE) ×1
GLOVE BIOGEL PI INDICATOR 7.5 (GLOVE) ×1
GLOVE ECLIPSE 6.5 STRL STRAW (GLOVE) ×2 IMPLANT
GLOVE SS BIOGEL STRL SZ 7 (GLOVE) ×1 IMPLANT
GLOVE SUPERSENSE BIOGEL SZ 7 (GLOVE) ×1
GOWN STRL REUS W/ TWL LRG LVL3 (GOWN DISPOSABLE) ×3 IMPLANT
GOWN STRL REUS W/TWL LRG LVL3 (GOWN DISPOSABLE) ×3
KIT BASIN OR (CUSTOM PROCEDURE TRAY) ×2 IMPLANT
KIT ROOM TURNOVER OR (KITS) ×2 IMPLANT
LIQUID BAND (GAUZE/BANDAGES/DRESSINGS) ×2 IMPLANT
NS IRRIG 1000ML POUR BTL (IV SOLUTION) ×2 IMPLANT
PACK CV ACCESS (CUSTOM PROCEDURE TRAY) ×2 IMPLANT
PAD ARMBOARD 7.5X6 YLW CONV (MISCELLANEOUS) ×4 IMPLANT
SUT PROLENE 6 0 BV (SUTURE) ×2 IMPLANT
SUT PROLENE 7 0 BV 1 (SUTURE) ×2 IMPLANT
SUT VIC AB 3-0 SH 27 (SUTURE) ×1
SUT VIC AB 3-0 SH 27X BRD (SUTURE) ×1 IMPLANT
UNDERPAD 30X30 INCONTINENT (UNDERPADS AND DIAPERS) ×2 IMPLANT
WATER STERILE IRR 1000ML POUR (IV SOLUTION) ×2 IMPLANT

## 2015-01-30 NOTE — Op Note (Signed)
OPERATIVE REPORT  Date of Surgery: 01/30/2015  Surgeon: Tinnie Gens, MD  Assistant: Leontine Locket, PA  Pre-op Diagnosis: Chronic Kidney Disease-stage IV  Post-op Diagnosis:  Chronic Kidney Disease-stage IV  Procedure: Procedure(s): Creation of a Radial Cephalic AV Fistula left wrist  Anesthesia: Mac  EBL: Minimal  Complications: None  Procedure Details: The patient was taken and placed in supine position at which time a left upper extremity was prepped with Betadine scrub and solution draped in routine sterile manner. The cephalic vein was then imaged using the SonoSite ultrasound and appeared to be satisfactory for fistula creation in the forearm. After infiltration forms and Xylocaine with epinephrine short longitudinal incision was made just proximal to the wrist cephalic vein dissected free its branches ligated with 3 and 4-0 silk ties and divided it was transected after ligating it distally gently dilated with heparinized saline and marked for orientation purposes. It did appear to be a satisfactory vein dilating up to about 3 mm in size. Radial artery was then exposed and circled with Vesseloops E good pulse was 2-1/2 mm in size. It was occluded proximally and distally with Vesseloops opened 15 blade extended with Potts scissors there was good inflow. The vein was carefully measured spatulated and anastomosed end to side with 60 proline. Vesseloops then released and there was a good pulse and palpable thrill up to the antecubital area. This was then imaged with the ultrasound looking for competing branches and none were found. Haddock hemostasis was achieved the wound closed in layers with Vicryls in a subcuticular fashion with Dermabond patient taken to recovery room in satisfactory condition   Tinnie Gens, MD 01/30/2015 8:54 AM

## 2015-01-30 NOTE — Interval H&P Note (Signed)
History and Physical Interval Note:  01/30/2015 8:57 AM  [name]  has presented today for surgery, with the diagnosis of Chronic Kidney Disease  The various methods of treatment have been discussed with the patient and family. After consideration of risks, benefits and other options for treatment, the patient has consented to  Procedure(s): Creation of a Radial Cephalic AV Fistula left wrist (Left) as a surgical intervention .  The patient's history has been reviewed, patient examined, no change in status, stable for surgery.  I have reviewed the patient's chart and labs.  Questions were answered to the patient's satisfaction.     Tinnie Gens

## 2015-01-30 NOTE — Anesthesia Postprocedure Evaluation (Signed)
  Anesthesia Post-op Note  Patient: [name]  Procedure(s) Performed: Procedure(s): Creation of a Radial Cephalic AV Fistula left wrist (Left)  Patient Location: PACU  Anesthesia Type:MAC  Level of Consciousness: awake, alert  and oriented  Airway and Oxygen Therapy: Patient Spontanous Breathing  Post-op Pain: none  Post-op Assessment: Post-op Vital signs reviewed              Post-op Vital Signs: Reviewed  Last Vitals:  Filed Vitals:   01/30/15 0934  BP: 121/85  Pulse: 66  Temp:   Resp:     Complications: No apparent anesthesia complications

## 2015-01-30 NOTE — Anesthesia Preprocedure Evaluation (Addendum)
Anesthesia Evaluation  Patient identified by MRN, date of birth, ID band Patient awake    Reviewed: Allergy & Precautions, NPO status , Patient's Chart, lab work & pertinent test results, reviewed documented beta blocker date and time   History of Anesthesia Complications (+) PONV  Airway Mallampati: II  TM Distance: >3 FB Neck ROM: Full    Dental   Pulmonary former smoker,  breath sounds clear to auscultation        Cardiovascular hypertension, Pt. on medications and Pt. on home beta blockers + CAD, + Past MI, + CABG and + Peripheral Vascular Disease + Valvular Problems/Murmurs (Mild AS) AS Rhythm:Regular Rate:Normal     Neuro/Psych negative neurological ROS     GI/Hepatic Neg liver ROS, GERD-  ,  Endo/Other  diabetes, Type 2  Renal/GU ESRFRenal disease     Musculoskeletal   Abdominal   Peds  Hematology  (+) anemia , Thrombocytopenia   Anesthesia Other Findings   Reproductive/Obstetrics                            Lab Results  Component Value Date   WBC 7.1 01/14/2015   HGB 10.2* 01/30/2015   HCT 30.0* 01/30/2015   MCV 103.4* 01/14/2015   PLT 78* 01/30/2015   Lab Results  Component Value Date   CREATININE 2.9* 07/09/2014   BUN 47.9* 07/09/2014   NA 131* 01/30/2015   K 4.2 01/30/2015   CL 105 12/20/2013   CO2 21* 07/09/2014    Anesthesia Physical Anesthesia Plan  ASA: III  Anesthesia Plan: MAC   Post-op Pain Management:    Induction: Intravenous  Airway Management Planned: Natural Airway and Simple Face Mask  Additional Equipment:   Intra-op Plan:   Post-operative Plan:   Informed Consent: I have reviewed the patients History and Physical, chart, labs and discussed the procedure including the risks, benefits and alternatives for the proposed anesthesia with the patient or authorized representative who has indicated his/her understanding and acceptance.     Plan  Discussed with: CRNA  Anesthesia Plan Comments:         Anesthesia Quick Evaluation

## 2015-01-30 NOTE — H&P (View-Only) (Signed)
Subjective:     Patient ID: [name], female   DOB: August 29, 1940, 74 y.o.   MRN: [id number]  HPI this 74 year old female was referred for a second opinion regarding vascular access. She has stage V chronic kidney disease. She is cared for by Dr. Audie Clear at [facility]. She has been evaluated by Dr. Elliot Gurney at [facility]. He recommended an AV graft in the left arm when patient was near needing hemodialysis. He did not feel the veins were large enough for fistula creation based on vein mapping. Patient is right-handed. She states she has been saving the veins in her left arm since she knew that she had kidney disease. She does have a history of ordering artery disease having previously undergone coronary artery bypass grafting by Dr. Tharon Aquas Trigt. She also has chronic anemia and is followed on a regular basis by hematology.  Past Medical History  Diagnosis Date  . Hypertension   . Hyperlipidemia   . Diabetes mellitus   . Coronary artery disease     Pt presented 2/10 to [facility] with NSTEMI and diastolic CHF exacerbation.  LHC was done  3/10 showing 99% pRCA stenosis and 80% calcified pLAD stenosis with L=>R collaterals.  Pt was referred  for CABG which was done by Dr. Prescott Gum with LIMA-LAD, SVG-RCA, SVG-OM.  [name] CKD (chronic kidney disease)      Last creatinine 1.4.  . Anxiety   . Chronic low back pain   . Anemia     Pt is taking iron.   . Mild aortic stenosis     mean gradient 12 mmHg in 2/12.  . Thrombocytopenia   . Carotid stenosis     40-59% bilateral ICA stenosis in 2/12.  . Diastolic CHF     Echo ([id number]) showed EF 55-65%, mild LVH, diastolic dysfunction, mild AS with mean gradient 12 mmHg, PASP 43 mmHg.  Echo (2/12): EF 55-60%, mild LVH, mild AS (mean gradient 12), PA systolic pressure 32 mmHg.       History  Substance Use Topics  . Smoking status: Former Smoker    Types: Cigarettes  . Smokeless tobacco: Former Systems developer    Quit date: 08/15/1988   Comment: quit 1988  . Alcohol Use: Yes     Comment: beer    Family History  Problem Relation Age of Onset  . Adopted: Yes    Allergies  Allergen Reactions  . Lipitor [Atorvastatin]     Stomach pain  . Strawberry Rash     Current outpatient prescriptions:  .  ALPRAZolam (XANAX) 0.5 MG tablet, Take 0.5 mg by mouth daily., Disp: , Rfl:  .  amLODipine (NORVASC) 5 MG tablet, Take 1 tablet (5 mg total) by mouth daily., Disp: 30 tablet, Rfl: 11 .  aspirin 81 MG tablet, Take 81 mg by mouth daily. , Disp: , Rfl:  .  calcium acetate (PHOSLO) 667 MG capsule, As directed, Disp: , Rfl:  .  carvedilol (COREG) 12.5 MG tablet, TAKE 1 TABLET BY MOUTH TWICE A DAY, Disp: 60 tablet, Rfl: 5 .  cholecalciferol (VITAMIN D) 1000 UNITS tablet, Take 1,000 Units by mouth daily., Disp: , Rfl:  .  CRESTOR 5 MG tablet, TAKE 1 TABLET BY MOUTH EVERY DAY, Disp: 90 tablet, Rfl: 1 .  FLUZONE HIGH-DOSE 0.5 ML SUSY, , Disp: , Rfl: 0 .  furosemide (LASIX) 20 MG tablet, As directed, Disp: , Rfl:  .  losartan (COZAAR) 25 MG tablet, Take 1 tablet (  25 mg total) by mouth daily., Disp: 90 tablet, Rfl: 3 .  Multiple Vitamin (MULTIVITAMIN) capsule, Take 1 capsule by mouth daily., Disp: , Rfl:  .  niacin (NIASPAN) 1000 MG CR tablet, Take 1,000 mg by mouth at bedtime. , Disp: , Rfl:  .  timolol (BETIMOL) 0.5 % ophthalmic solution, 1 drop 2 (two) times daily., Disp: , Rfl:  .  trimethoprim (TRIMPEX) 100 MG tablet, Take 50 mg by mouth daily. As directed, Disp: , Rfl:  .  [DISCONTINUED] metoprolol succinate (TOPROL-XL) 50 MG 24 hr tablet, Take 50 mg by mouth 2 (two) times daily. Take with or immediately following a meal., Disp: , Rfl:   Filed Vitals:   01/13/15 1056  BP: 127/67  Pulse: 62  Temp: 97.8 F (36.6 C)  TempSrc: Oral  Resp: 14  Height: 5\' 8"  (1.727 m)  Weight: 145 lb (65.772 kg)  SpO2: 97%    Body mass index is 22.05 kg/(m^2).         Review of Systems denies active chest pain   or dyspnea on  exertion. Patient does have easy bruisability due to anemia and low platelets at times. History of coronary artery bypass grafting as noted above. Objective:   Physical Exam BP 127/67 mmHg  Pulse 62  Temp(Src) 97.8 F (36.6 C) (Oral)  Resp 14  Ht 5\' 8"  (1.727 m)  Wt 145 lb (65.772 kg)  BMI 22.05 kg/m2  SpO2 97%  Gen.-alert and oriented x3 in no apparent distress HEENT normal for age Lungs no rhonchi or wheezing Cardiovascular regular rhythm no murmurs carotid pulses 3+ palpable no bruits audible Abdomen soft nontender no palpable masses Musculoskeletal free of  major deformities Skin clear -no rashes-a few ecchymosis in the upper extremities noted. Neurologic normal Lower extremities 3+ femoral and dorsalis pedis pulses palpable bilaterally with no edema 3+ brachial 2+ radial pulses palpable bilaterally. Left cephalic vein appears adequate in the forearm for fistula creation except the very distal aspect does taper down to a smaller caliber  Today I reviewed the vein mapping results performed at [facility] which seemed to indicate that the cephalic vein in the left arm is not satisfactory and the forearm due to small caliber. Left basilic vein does appear to be possibly adequate  Today I performed a bedside sonosite  ultrasound exam with tourniquet in place. It appears to me that the cephalic vein may be adequate for fistula creation and if not a basilic vein transposition should be attempted and may well be satisfactory.       Assessment:     Chronic kidney disease-stage V needs vascular access-second opinion performed today Coronary artery disease status post coronary artery bypass grafting Chronic anemia    Plan:     I would recommend attempting left radial-cephalic AV fistula initially. The caliber of the vein does tapers slightly in the very distal aspect of the left forearm but this may be a satisfactory site for fistula creation. If this is not successful would then  recommend proceeding with left basilic vein transposition Patient will notify us if she would like to schedule this in the near future after discussing with her nephrologist    r

## 2015-01-30 NOTE — Telephone Encounter (Addendum)
-----   Message from Denman George, RN sent at 01/30/2015  9:31 AM EDT ----- Regarding: needs 6 wk f/u with access duplex and appt. with [facility]   ----- Message -----    From: Gabriel Earing, PA-C    Sent: 01/30/2015   8:44 AM      To: Vvs Charge Pool  S/p left RC AVF 01/30/15.  F/u with JDL in 6 weeks with duplex.  Thanks, Samantha  notified patient of post op appt. on 03-17-15 at 11:30 for vascular lab and 12:30 to see dr. Kellie Simmering

## 2015-01-30 NOTE — Transfer of Care (Signed)
Immediate Anesthesia Transfer of Care Note  Patient: [name]  Procedure(s) Performed: Procedure(s): Creation of a Radial Cephalic AV Fistula left wrist (Left)  Patient Location: PACU  Anesthesia Type:MAC  Level of Consciousness: awake, alert  and oriented  Airway & Oxygen Therapy: Patient Spontanous Breathing and Patient connected to face mask oxygen  Post-op Assessment: Report given to RN and Post -op Vital signs reviewed and stable  Post vital signs: Reviewed and stable  Last Vitals:  Filed Vitals:   01/30/15 0602  BP: 132/74  Pulse: 62  Temp: 36.4 C  Resp: 16    Complications: No apparent anesthesia complications

## 2015-01-30 NOTE — Discharge Instructions (Signed)
° ° °  01/30/2015 [name] [id number] 09/17/1940  Surgeon(s): [name], MD  Procedure(s): Creation of a Radial Cephalic Fistula left wrist  x Do not stick fistulafor 12 weeks

## 2015-02-02 ENCOUNTER — Encounter (HOSPITAL_COMMUNITY): Payer: Self-pay | Admitting: Vascular Surgery

## 2015-02-02 ENCOUNTER — Telehealth: Payer: Self-pay

## 2015-02-02 NOTE — Telephone Encounter (Signed)
Discussed with Dr. Kellie Simmering.  Recommended to check pt. for any signs of steal syndrome.  Stated if no signs/ symptoms of steal, the pt. should be able to take ES Tylenol for the incisional pain.  Reported that the incision of the left forearm is very small, and shouldn't be so painful, that the ES Tylenol wouldn't manage.  Called pt. back; questioned about any signs of pain in left hand, numbness of fingers, difficulty gripping with left hand, or change in skin temp. Of hand.  Stated the left hand is slightly cool, but denied difficulty gripping.  Stated she hasn't had numbness of her fingers.  Stated the "left hand hurt a little at times."  Advised to try the ES Tylenol, and to call office if symptoms worsen.  Verb. Understanding.

## 2015-02-02 NOTE — Telephone Encounter (Signed)
Follow-up phone call to pt. after message left via Answering Service on 6/19, re: pain and nausea.  Pt. Reported she was having nausea associated with taking pain medication.  Stated the On Call MD returned her phone call, and ordered Phenergan supp. for the nausea, but she didn't get Rx filled, due to the expense of the medication; reported it was going to cost $101.00.  Reported she took Meclizine that her son gave her, and is feeling a little better.  Stated she still has some nausea, and is "eating very little."  Stated the Oxycodone is working for the pain, but it causes nausea.  Reported she thinks she is taking in fluids okay.  Advised will speak with Dr. Kellie Simmering re: any options for an alternate medication for nausea.

## 2015-02-04 ENCOUNTER — Ambulatory Visit (HOSPITAL_BASED_OUTPATIENT_CLINIC_OR_DEPARTMENT_OTHER): Payer: Medicare HMO

## 2015-02-04 ENCOUNTER — Other Ambulatory Visit (HOSPITAL_BASED_OUTPATIENT_CLINIC_OR_DEPARTMENT_OTHER): Payer: Medicare HMO

## 2015-02-04 VITALS — BP 121/51 | HR 59 | Temp 97.7°F

## 2015-02-04 DIAGNOSIS — G4700 Insomnia, unspecified: Secondary | ICD-10-CM

## 2015-02-04 DIAGNOSIS — N189 Chronic kidney disease, unspecified: Secondary | ICD-10-CM

## 2015-02-04 DIAGNOSIS — D631 Anemia in chronic kidney disease: Secondary | ICD-10-CM | POA: Diagnosis not present

## 2015-02-04 DIAGNOSIS — D696 Thrombocytopenia, unspecified: Secondary | ICD-10-CM

## 2015-02-04 DIAGNOSIS — D649 Anemia, unspecified: Secondary | ICD-10-CM

## 2015-02-04 LAB — CBC WITH DIFFERENTIAL/PLATELET
BASO%: 0.7 % (ref 0.0–2.0)
Basophils Absolute: 0.1 10*3/uL (ref 0.0–0.1)
EOS%: 3.5 % (ref 0.0–7.0)
Eosinophils Absolute: 0.2 10*3/uL (ref 0.0–0.5)
HCT: 33.8 % — ABNORMAL LOW (ref 34.8–46.6)
HGB: 10.8 g/dL — ABNORMAL LOW (ref 11.6–15.9)
LYMPH#: 1.8 10*3/uL (ref 0.9–3.3)
LYMPH%: 25.3 % (ref 14.0–49.7)
MCH: 35.5 pg — AB (ref 25.1–34.0)
MCHC: 32 g/dL (ref 31.5–36.0)
MCV: 111.2 fL — ABNORMAL HIGH (ref 79.5–101.0)
MONO#: 1.1 10*3/uL — ABNORMAL HIGH (ref 0.1–0.9)
MONO%: 15.9 % — ABNORMAL HIGH (ref 0.0–14.0)
NEUT%: 54.6 % (ref 38.4–76.8)
NEUTROS ABS: 3.8 10*3/uL (ref 1.5–6.5)
Platelets: 101 10*3/uL — ABNORMAL LOW (ref 145–400)
RBC: 3.04 10*6/uL — AB (ref 3.70–5.45)
RDW: 14.3 % (ref 11.2–14.5)
WBC: 6.9 10*3/uL (ref 3.9–10.3)
nRBC: 0 % (ref 0–0)

## 2015-02-04 MED ORDER — DARBEPOETIN ALFA 300 MCG/0.6ML IJ SOSY
300.0000 ug | PREFILLED_SYRINGE | Freq: Once | INTRAMUSCULAR | Status: AC
  Administered 2015-02-04: 300 ug via SUBCUTANEOUS
  Filled 2015-02-04: qty 0.6

## 2015-02-25 ENCOUNTER — Other Ambulatory Visit (HOSPITAL_BASED_OUTPATIENT_CLINIC_OR_DEPARTMENT_OTHER): Payer: Medicare HMO

## 2015-02-25 ENCOUNTER — Ambulatory Visit (HOSPITAL_BASED_OUTPATIENT_CLINIC_OR_DEPARTMENT_OTHER): Payer: Medicare HMO

## 2015-02-25 VITALS — BP 99/54 | HR 71 | Temp 98.1°F

## 2015-02-25 DIAGNOSIS — N189 Chronic kidney disease, unspecified: Secondary | ICD-10-CM

## 2015-02-25 DIAGNOSIS — D631 Anemia in chronic kidney disease: Secondary | ICD-10-CM

## 2015-02-25 DIAGNOSIS — D649 Anemia, unspecified: Secondary | ICD-10-CM

## 2015-02-25 LAB — CBC WITH DIFFERENTIAL/PLATELET
BASO%: 0.4 % (ref 0.0–2.0)
Basophils Absolute: 0 10*3/uL (ref 0.0–0.1)
EOS%: 3.9 % (ref 0.0–7.0)
Eosinophils Absolute: 0.2 10*3/uL (ref 0.0–0.5)
HCT: 24.5 % — ABNORMAL LOW (ref 34.8–46.6)
HEMOGLOBIN: 8.3 g/dL — AB (ref 11.6–15.9)
LYMPH%: 20.4 % (ref 14.0–49.7)
MCH: 34.3 pg — AB (ref 25.1–34.0)
MCHC: 33.9 g/dL (ref 31.5–36.0)
MCV: 101.2 fL — ABNORMAL HIGH (ref 79.5–101.0)
MONO#: 0.6 10*3/uL (ref 0.1–0.9)
MONO%: 11.6 % (ref 0.0–14.0)
NEUT%: 63.7 % (ref 38.4–76.8)
NEUTROS ABS: 3.5 10*3/uL (ref 1.5–6.5)
NRBC: 0 % (ref 0–0)
Platelets: 120 10*3/uL — ABNORMAL LOW (ref 145–400)
RBC: 2.42 10*6/uL — AB (ref 3.70–5.45)
RDW: 13.6 % (ref 11.2–14.5)
WBC: 5.4 10*3/uL (ref 3.9–10.3)
lymph#: 1.1 10*3/uL (ref 0.9–3.3)

## 2015-02-25 MED ORDER — DARBEPOETIN ALFA 300 MCG/0.6ML IJ SOSY
300.0000 ug | PREFILLED_SYRINGE | Freq: Once | INTRAMUSCULAR | Status: AC
  Administered 2015-02-25: 300 ug via SUBCUTANEOUS
  Filled 2015-02-25: qty 0.6

## 2015-03-12 ENCOUNTER — Encounter: Payer: Self-pay | Admitting: Vascular Surgery

## 2015-03-17 ENCOUNTER — Ambulatory Visit (HOSPITAL_COMMUNITY)
Admission: RE | Admit: 2015-03-17 | Discharge: 2015-03-17 | Disposition: A | Discharge status: Home / Self Care | Payer: Medicare HMO | Source: Ambulatory Visit | Attending: Vascular Surgery | Admitting: Vascular Surgery

## 2015-03-17 ENCOUNTER — Encounter: Payer: Self-pay | Admitting: Vascular Surgery

## 2015-03-17 ENCOUNTER — Ambulatory Visit (INDEPENDENT_AMBULATORY_CARE_PROVIDER_SITE_OTHER): Payer: Self-pay | Admitting: Vascular Surgery

## 2015-03-17 VITALS — BP 155/72 | HR 66 | Temp 97.8°F | Resp 16 | Ht 68.0 in | Wt 145.0 lb

## 2015-03-17 DIAGNOSIS — N186 End stage renal disease: Secondary | ICD-10-CM | POA: Diagnosis not present

## 2015-03-17 DIAGNOSIS — N184 Chronic kidney disease, stage 4 (severe): Secondary | ICD-10-CM

## 2015-03-17 DIAGNOSIS — Z48812 Encounter for surgical aftercare following surgery on the circulatory system: Secondary | ICD-10-CM | POA: Diagnosis not present

## 2015-03-17 DIAGNOSIS — Z4931 Encounter for adequacy testing for hemodialysis: Secondary | ICD-10-CM

## 2015-03-17 NOTE — Addendum Note (Signed)
Addended by: Mena Goes on: 03/17/2015 05:07 PM   Modules accepted: Orders

## 2015-03-17 NOTE — Progress Notes (Signed)
Filed Vitals:   03/17/15 1230 03/17/15 1233  BP: 153/75 155/72  Pulse: 67 66  Temp: 97.8 F (36.6 C)   Resp: 16   Height: 5\' 8"  (1.727 m)   Weight: 145 lb (65.772 kg)   SpO2: 100%

## 2015-03-17 NOTE — Progress Notes (Signed)
POST OPERATIVE OFFICE NOTE    CC:  F/u for surgery  HPI:  This is a 74 y.o. female who is s/p left radial cephalic AVF by Dr. Kellie Simmering on January 30, 2015.  She states that she is doing well and does not have any pain in her fingers or hand.  She does have some numbness over the lateral aspect of her left thumb, but this is improving.  She states that she is not on HD yet and is on a medication to lower her potassium and hopefully, this will keep her off HD.    Allergies  Allergen Reactions  . Lipitor [Atorvastatin]     Stomach pain  . Strawberry Rash    Current Outpatient Prescriptions  Medication Sig Dispense Refill  . ALPRAZolam (XANAX) 0.5 MG tablet Take 0.5 mg by mouth daily.    [name] amLODipine (NORVASC) 5 MG tablet Take 1 tablet (5 mg total) by mouth daily. 90 tablet 3  . aspirin 81 MG tablet Take 81 mg by mouth daily.     . calcium acetate (PHOSLO) 667 MG capsule Take 1,334 mg by mouth 2 (two) times daily with a meal. As directed    . carvedilol (COREG) 12.5 MG tablet Take 1 tablet (12.5 mg total) by mouth 2 (two) times daily. 180 tablet 3  . cholecalciferol (VITAMIN D) 1000 UNITS tablet Take 1,000 Units by mouth daily.    . furosemide (LASIX) 20 MG tablet Take 20 mg by mouth daily. As directed    . losartan (COZAAR) 25 MG tablet Take 1 tablet (25 mg total) by mouth daily. 90 tablet 3  . rosuvastatin (CRESTOR) 5 MG tablet Take 1 tablet (5 mg total) by mouth daily. 90 tablet 3  . trimethoprim (TRIMPEX) 100 MG tablet Take 50 mg by mouth daily.     [name] oxyCODONE (ROXICODONE) 5 MG immediate release tablet Take 1 tablet (5 mg total) by mouth every 6 (six) hours as needed. (Patient not taking: Reported on 03/17/2015) 20 tablet 0  . [DISCONTINUED] metoprolol succinate (TOPROL-XL) 50 MG 24 hr tablet Take 50 mg by mouth 2 (two) times daily. Take with or immediately following a meal.     No current facility-administered medications for this visit.     ROS:  See HPI  Physical Exam:  Filed  Vitals:   03/17/15 1233  BP: 155/72  Pulse: 66  Temp:   Resp:     Incision:  Well healed  Extremities:  2+ palpable left radial & ulnar pulse.  There is a thrill and bruit within the fistula.  Motor and sensation are in tact in left hand.   HD dialysis access duplex 03/17/15: There is an elevated velocity with narrowing in the distal forearm.  There is a valve just beyond this.  It drains into the basilic system.   Assessment/Plan:  This is a 74 y.o. female who is s/p: Left radial cephalic AVF  -pt has an excellent thrill/bruit throughout her fistula.  The duplex does show a narrowing, but still excellent flow.  We will see her back in 3 months with a repeat duplex.  -she will check the fistula daily and report to [place] if it stops working.   Leontine Locket, PA-C [facility] [phone]  Clinic MD:  Pt seen and examined with Dr. Kellie Simmering  Left radial-cephalic AV fistula is widely patent with excellent pulse and palpable thrill. Ultrasound suggests a narrow focal segment about 2 cm from arterial anastomosis. This is difficult to  visualize with the ultrasound because of her very small thin arm. There is however excellent pulse beyond this.  Patient is not on dialysis at this point in time  We'll reassess in 3 months to see if this needs focal vein patch angioplasty for revision.

## 2015-03-18 ENCOUNTER — Other Ambulatory Visit (HOSPITAL_BASED_OUTPATIENT_CLINIC_OR_DEPARTMENT_OTHER): Payer: Medicare HMO

## 2015-03-18 ENCOUNTER — Ambulatory Visit (HOSPITAL_BASED_OUTPATIENT_CLINIC_OR_DEPARTMENT_OTHER): Payer: Medicare HMO

## 2015-03-18 VITALS — BP 122/53 | HR 64 | Temp 98.1°F

## 2015-03-18 DIAGNOSIS — D631 Anemia in chronic kidney disease: Secondary | ICD-10-CM | POA: Diagnosis not present

## 2015-03-18 DIAGNOSIS — N189 Chronic kidney disease, unspecified: Secondary | ICD-10-CM

## 2015-03-18 DIAGNOSIS — N183 Chronic kidney disease, stage 3 unspecified: Secondary | ICD-10-CM

## 2015-03-18 LAB — CBC WITH DIFFERENTIAL/PLATELET
BASO%: 0.9 % (ref 0.0–2.0)
BASOS ABS: 0.1 10*3/uL (ref 0.0–0.1)
EOS ABS: 0.3 10*3/uL (ref 0.0–0.5)
EOS%: 6.2 % (ref 0.0–7.0)
HCT: 25.2 % — ABNORMAL LOW (ref 34.8–46.6)
HEMOGLOBIN: 8.2 g/dL — AB (ref 11.6–15.9)
LYMPH%: 23.5 % (ref 14.0–49.7)
MCH: 34.3 pg — ABNORMAL HIGH (ref 25.1–34.0)
MCHC: 32.5 g/dL (ref 31.5–36.0)
MCV: 105.4 fL — ABNORMAL HIGH (ref 79.5–101.0)
MONO#: 0.9 10*3/uL (ref 0.1–0.9)
MONO%: 17.5 % — AB (ref 0.0–14.0)
NEUT%: 51.9 % (ref 38.4–76.8)
NEUTROS ABS: 2.8 10*3/uL (ref 1.5–6.5)
PLATELETS: 98 10*3/uL — AB (ref 145–400)
RBC: 2.39 10*6/uL — AB (ref 3.70–5.45)
RDW: 14.9 % — AB (ref 11.2–14.5)
WBC: 5.3 10*3/uL (ref 3.9–10.3)
lymph#: 1.3 10*3/uL (ref 0.9–3.3)
nRBC: 1 % — ABNORMAL HIGH (ref 0–0)

## 2015-03-18 MED ORDER — DARBEPOETIN ALFA 300 MCG/0.6ML IJ SOSY
300.0000 ug | PREFILLED_SYRINGE | Freq: Once | INTRAMUSCULAR | Status: AC
  Administered 2015-03-18: 300 ug via SUBCUTANEOUS
  Filled 2015-03-18: qty 0.6

## 2015-04-08 ENCOUNTER — Other Ambulatory Visit (HOSPITAL_BASED_OUTPATIENT_CLINIC_OR_DEPARTMENT_OTHER): Payer: Medicare HMO

## 2015-04-08 ENCOUNTER — Ambulatory Visit (HOSPITAL_BASED_OUTPATIENT_CLINIC_OR_DEPARTMENT_OTHER): Payer: Medicare HMO

## 2015-04-08 VITALS — BP 113/48 | HR 55 | Temp 97.7°F

## 2015-04-08 DIAGNOSIS — N189 Chronic kidney disease, unspecified: Secondary | ICD-10-CM | POA: Diagnosis not present

## 2015-04-08 DIAGNOSIS — D631 Anemia in chronic kidney disease: Secondary | ICD-10-CM

## 2015-04-08 DIAGNOSIS — N183 Chronic kidney disease, stage 3 unspecified: Secondary | ICD-10-CM

## 2015-04-08 DIAGNOSIS — D696 Thrombocytopenia, unspecified: Secondary | ICD-10-CM

## 2015-04-08 LAB — CBC WITH DIFFERENTIAL/PLATELET
BASO%: 0.4 % (ref 0.0–2.0)
Basophils Absolute: 0 10*3/uL (ref 0.0–0.1)
EOS ABS: 0.4 10*3/uL (ref 0.0–0.5)
EOS%: 7 % (ref 0.0–7.0)
HCT: 29.1 % — ABNORMAL LOW (ref 34.8–46.6)
HGB: 9.6 g/dL — ABNORMAL LOW (ref 11.6–15.9)
LYMPH%: 30.8 % (ref 14.0–49.7)
MCH: 34.3 pg — ABNORMAL HIGH (ref 25.1–34.0)
MCHC: 33 g/dL (ref 31.5–36.0)
MCV: 103.9 fL — AB (ref 79.5–101.0)
MONO#: 0.8 10*3/uL (ref 0.1–0.9)
MONO%: 15 % — AB (ref 0.0–14.0)
NEUT#: 2.6 10*3/uL (ref 1.5–6.5)
NEUT%: 46.8 % (ref 38.4–76.8)
NRBC: 0 % (ref 0–0)
PLATELETS: 87 10*3/uL — AB (ref 145–400)
RBC: 2.8 10*6/uL — AB (ref 3.70–5.45)
RDW: 14.6 % — AB (ref 11.2–14.5)
WBC: 5.5 10*3/uL (ref 3.9–10.3)
lymph#: 1.7 10*3/uL (ref 0.9–3.3)

## 2015-04-08 MED ORDER — DARBEPOETIN ALFA 300 MCG/0.6ML IJ SOSY
300.0000 ug | PREFILLED_SYRINGE | Freq: Once | INTRAMUSCULAR | Status: AC
  Administered 2015-04-08: 300 ug via SUBCUTANEOUS
  Filled 2015-04-08: qty 0.6

## 2015-04-29 ENCOUNTER — Other Ambulatory Visit (HOSPITAL_BASED_OUTPATIENT_CLINIC_OR_DEPARTMENT_OTHER): Payer: Medicare HMO

## 2015-04-29 ENCOUNTER — Ambulatory Visit: Payer: Medicare HMO

## 2015-04-29 ENCOUNTER — Encounter: Payer: Self-pay | Admitting: Oncology

## 2015-04-29 DIAGNOSIS — N189 Chronic kidney disease, unspecified: Secondary | ICD-10-CM

## 2015-04-29 DIAGNOSIS — D649 Anemia, unspecified: Secondary | ICD-10-CM

## 2015-04-29 DIAGNOSIS — N183 Chronic kidney disease, stage 3 unspecified: Secondary | ICD-10-CM

## 2015-04-29 DIAGNOSIS — D631 Anemia in chronic kidney disease: Secondary | ICD-10-CM

## 2015-04-29 LAB — CBC WITH DIFFERENTIAL/PLATELET
BASO%: 0.7 % (ref 0.0–2.0)
Basophils Absolute: 0 10*3/uL (ref 0.0–0.1)
EOS%: 3.1 % (ref 0.0–7.0)
Eosinophils Absolute: 0.2 10*3/uL (ref 0.0–0.5)
HEMATOCRIT: 33.3 % — AB (ref 34.8–46.6)
HGB: 11.2 g/dL — ABNORMAL LOW (ref 11.6–15.9)
LYMPH#: 1.5 10*3/uL (ref 0.9–3.3)
LYMPH%: 25.9 % (ref 14.0–49.7)
MCH: 33.9 pg (ref 25.1–34.0)
MCHC: 33.6 g/dL (ref 31.5–36.0)
MCV: 100.9 fL (ref 79.5–101.0)
MONO#: 0.7 10*3/uL (ref 0.1–0.9)
MONO%: 12.8 % (ref 0.0–14.0)
NEUT%: 57.5 % (ref 38.4–76.8)
NEUTROS ABS: 3.3 10*3/uL (ref 1.5–6.5)
Platelets: 96 10*3/uL — ABNORMAL LOW (ref 145–400)
RBC: 3.3 10*6/uL — ABNORMAL LOW (ref 3.70–5.45)
RDW: 13.7 % (ref 11.2–14.5)
WBC: 5.8 10*3/uL (ref 3.9–10.3)
nRBC: 0 % (ref 0–0)

## 2015-04-29 MED ORDER — DARBEPOETIN ALFA 300 MCG/0.6ML IJ SOSY: 300.0000 ug | PREFILLED_SYRINGE | Freq: Once | INTRAMUSCULAR | Status: DC

## 2015-04-29 NOTE — Progress Notes (Signed)
Pt came in to follow up on someone coming to talk to her in the lobby and asking for her income a while back. She couldn't remember who she spoke with. Pt was given number for PAN to apply over the phone for possible assistance due to pt not having income with her today. She verbalized understanding and states it was simple and she had applied for one via phone before.

## 2015-05-20 ENCOUNTER — Ambulatory Visit (HOSPITAL_BASED_OUTPATIENT_CLINIC_OR_DEPARTMENT_OTHER): Payer: Medicare HMO

## 2015-05-20 ENCOUNTER — Other Ambulatory Visit (HOSPITAL_BASED_OUTPATIENT_CLINIC_OR_DEPARTMENT_OTHER): Payer: Medicare HMO

## 2015-05-20 VITALS — BP 151/52 | HR 53 | Temp 97.7°F

## 2015-05-20 DIAGNOSIS — N189 Chronic kidney disease, unspecified: Secondary | ICD-10-CM

## 2015-05-20 DIAGNOSIS — D631 Anemia in chronic kidney disease: Secondary | ICD-10-CM | POA: Diagnosis not present

## 2015-05-20 LAB — CBC WITH DIFFERENTIAL/PLATELET
BASO%: 0.6 % (ref 0.0–2.0)
BASOS ABS: 0 10*3/uL (ref 0.0–0.1)
EOS ABS: 0.1 10*3/uL (ref 0.0–0.5)
EOS%: 2.5 % (ref 0.0–7.0)
HCT: 29.4 % — ABNORMAL LOW (ref 34.8–46.6)
HEMOGLOBIN: 9.6 g/dL — AB (ref 11.6–15.9)
LYMPH%: 33.8 % (ref 14.0–49.7)
MCH: 33 pg (ref 25.1–34.0)
MCHC: 32.7 g/dL (ref 31.5–36.0)
MCV: 101 fL (ref 79.5–101.0)
MONO#: 0.6 10*3/uL (ref 0.1–0.9)
MONO%: 10.9 % (ref 0.0–14.0)
NEUT#: 2.8 10*3/uL (ref 1.5–6.5)
NEUT%: 52.2 % (ref 38.4–76.8)
Platelets: 101 10*3/uL — ABNORMAL LOW (ref 145–400)
RBC: 2.91 10*6/uL — AB (ref 3.70–5.45)
RDW: 13 % (ref 11.2–14.5)
WBC: 5.3 10*3/uL (ref 3.9–10.3)
lymph#: 1.8 10*3/uL (ref 0.9–3.3)
nRBC: 0 % (ref 0–0)

## 2015-05-20 MED ORDER — DARBEPOETIN ALFA 300 MCG/0.6ML IJ SOSY
300.0000 ug | PREFILLED_SYRINGE | Freq: Once | INTRAMUSCULAR | Status: AC
  Administered 2015-05-20: 300 ug via SUBCUTANEOUS
  Filled 2015-05-20: qty 0.6

## 2015-05-28 ENCOUNTER — Telehealth: Payer: Self-pay | Admitting: Oncology

## 2015-05-28 NOTE — Telephone Encounter (Signed)
PAL - moved 11/17 appointments to 11/18. Other appointments remain the same. Left message for patient and mailed schedule.

## 2015-05-29 ENCOUNTER — Telehealth: Payer: Self-pay | Admitting: Oncology

## 2015-05-29 NOTE — Telephone Encounter (Signed)
returned call and s.w. pt and r/s appt per pt request....pt ok adn aware of new d.t

## 2015-06-10 ENCOUNTER — Ambulatory Visit: Payer: Medicare HMO

## 2015-06-10 ENCOUNTER — Other Ambulatory Visit (HOSPITAL_BASED_OUTPATIENT_CLINIC_OR_DEPARTMENT_OTHER): Payer: Medicare HMO

## 2015-06-10 DIAGNOSIS — N189 Chronic kidney disease, unspecified: Secondary | ICD-10-CM | POA: Diagnosis not present

## 2015-06-10 DIAGNOSIS — D631 Anemia in chronic kidney disease: Secondary | ICD-10-CM

## 2015-06-10 DIAGNOSIS — N183 Chronic kidney disease, stage 3 unspecified: Secondary | ICD-10-CM

## 2015-06-10 LAB — CBC WITH DIFFERENTIAL/PLATELET
BASO%: 0.5 % (ref 0.0–2.0)
BASOS ABS: 0 10*3/uL (ref 0.0–0.1)
EOS ABS: 0.2 10*3/uL (ref 0.0–0.5)
EOS%: 3.9 % (ref 0.0–7.0)
HEMATOCRIT: 33.1 % — AB (ref 34.8–46.6)
HEMOGLOBIN: 11 g/dL — AB (ref 11.6–15.9)
LYMPH#: 1.6 10*3/uL (ref 0.9–3.3)
LYMPH%: 28.1 % (ref 14.0–49.7)
MCH: 33.4 pg (ref 25.1–34.0)
MCHC: 33.2 g/dL (ref 31.5–36.0)
MCV: 100.6 fL (ref 79.5–101.0)
MONO#: 0.7 10*3/uL (ref 0.1–0.9)
MONO%: 13.1 % (ref 0.0–14.0)
NEUT#: 3.1 10*3/uL (ref 1.5–6.5)
NEUT%: 54.4 % (ref 38.4–76.8)
NRBC: 0 % (ref 0–0)
Platelets: 104 10*3/uL — ABNORMAL LOW (ref 145–400)
RBC: 3.29 10*6/uL — ABNORMAL LOW (ref 3.70–5.45)
RDW: 14.1 % (ref 11.2–14.5)
WBC: 5.7 10*3/uL (ref 3.9–10.3)

## 2015-06-10 MED ORDER — DARBEPOETIN ALFA 300 MCG/0.6ML IJ SOSY: 300.0000 ug | PREFILLED_SYRINGE | Freq: Once | INTRAMUSCULAR | Status: DC

## 2015-06-19 ENCOUNTER — Encounter: Payer: Self-pay | Admitting: Vascular Surgery

## 2015-06-23 ENCOUNTER — Other Ambulatory Visit: Payer: Self-pay

## 2015-06-23 ENCOUNTER — Encounter: Payer: Self-pay | Admitting: Vascular Surgery

## 2015-06-23 ENCOUNTER — Ambulatory Visit (INDEPENDENT_AMBULATORY_CARE_PROVIDER_SITE_OTHER): Payer: Medicare HMO | Admitting: Vascular Surgery

## 2015-06-23 ENCOUNTER — Ambulatory Visit (HOSPITAL_COMMUNITY)
Admission: RE | Admit: 2015-06-23 | Discharge: 2015-06-23 | Disposition: A | Discharge status: Home / Self Care | Payer: Medicare HMO | Source: Ambulatory Visit | Attending: Vascular Surgery | Admitting: Vascular Surgery

## 2015-06-23 VITALS — BP 152/52 | HR 61 | Temp 97.7°F | Resp 16 | Ht 68.0 in | Wt 136.0 lb

## 2015-06-23 DIAGNOSIS — N184 Chronic kidney disease, stage 4 (severe): Secondary | ICD-10-CM

## 2015-06-23 DIAGNOSIS — Z4931 Encounter for adequacy testing for hemodialysis: Secondary | ICD-10-CM | POA: Insufficient documentation

## 2015-06-23 DIAGNOSIS — N186 End stage renal disease: Secondary | ICD-10-CM | POA: Insufficient documentation

## 2015-06-23 DIAGNOSIS — Z992 Dependence on renal dialysis: Secondary | ICD-10-CM

## 2015-06-23 NOTE — Progress Notes (Signed)
Filed Vitals:   06/23/15 0837 06/23/15 0838  BP: 142/55 152/52  Pulse: 61   Temp: 97.7 F (36.5 C)   Resp: 16   Height: 5\' 8"  (1.727 m)   Weight: 136 lb (61.689 kg)   SpO2: 100%

## 2015-06-23 NOTE — Progress Notes (Signed)
Subjective:     Patient ID: [name], female   DOB: May 06, 1941, 74 y.o.   MRN: [id number]  HPI this 74 year old female had a left radial-cephalic AV fistula created by me in June 2016. 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 She denies pain or numbness in the left hand. This is her initial follow-up appointment.  Past Medical History  Diagnosis Date  . Hypertension   . Hyperlipidemia   . Diabetes mellitus   . Coronary artery disease     Pt presented 2/10 to [facility] with NSTEMI and diastolic CHF exacerbation.  LHC was done  3/10 showing 99% pRCA stenosis and 80% calcified pLAD stenosis with L=>R collaterals.  Pt was referred  for CABG which was done by Dr. Prescott Gum with LIMA-LAD, SVG-RCA, SVG-OM.  [name] Anxiety   . Chronic low back pain   . Anemia     Pt is taking iron.   . Mild aortic stenosis     mean gradient 12 mmHg in 2/12.  . Thrombocytopenia ([place])   . Carotid stenosis     40-59% bilateral ICA stenosis in 2/12.  . Diastolic CHF ([place])     Echo (2/10) showed EF 55-65%, mild LVH, diastolic dysfunction, mild AS with mean gradient 12 mmHg, PASP 43 mmHg.  Echo (2/12): EF 55-60%, mild LVH, mild AS (mean gradient 12), PA systolic pressure 32 mmHg.     [name] PONV (postoperative nausea and vomiting)   . Myocardial infarction ([place])     "mild"  . Heart murmur   . Pneumonia   . GERD (gastroesophageal reflux disease)   . Diabetic neuropathy ([place])   . Unsteady gait   . Arthritis   . CKD (chronic kidney disease)     Dr. Audie Clear at [facility]    Social History  Substance Use Topics  . Smoking status: Former Smoker    Types: Cigarettes  . Smokeless tobacco: Never Used     Comment: quit 1988  . Alcohol Use: Yes     Comment: beer    Family History  Problem Relation Age of Onset  . Adopted: Yes  . Family history unknown: Yes    Allergies  Allergen Reactions  . Lipitor [Atorvastatin]     Stomach pain  . Strawberry Extract Rash      Current outpatient prescriptions:  .  ALPRAZolam (XANAX) 0.5 MG tablet, Take 0.5 mg by mouth daily., Disp: , Rfl:  .  amLODipine (NORVASC) 5 MG tablet, Take 1 tablet (5 mg total) by mouth daily., Disp: 90 tablet, Rfl: 3 .  aspirin 81 MG tablet, Take 81 mg by mouth daily. , Disp: , Rfl:  .  calcium acetate (PHOSLO) 667 MG capsule, Take 1,334 mg by mouth 2 (two) times daily with a meal. As directed, Disp: , Rfl:  .  carvedilol (COREG) 12.5 MG tablet, Take 1 tablet (12.5 mg total) by mouth 2 (two) times daily., Disp: 180 tablet, Rfl: 3 .  cholecalciferol (VITAMIN D) 1000 UNITS tablet, Take 1,000 Units by mouth daily., Disp: , Rfl:  .  furosemide (LASIX) 20 MG tablet, Take 20 mg by mouth daily. As directed, Disp: , Rfl:  .  losartan (COZAAR) 25 MG tablet, Take 1 tablet (25 mg total) by mouth daily., Disp: 90 tablet, Rfl: 3 .  oxyCODONE (ROXICODONE) 5 MG immediate release tablet, Take 1 tablet (5 mg total) by mouth every 6 (six) hours as needed. (Patient not taking: Reported on 03/17/2015),  Disp: 20 tablet, Rfl: 0 .  rosuvastatin (CRESTOR) 5 MG tablet, Take 1 tablet (5 mg total) by mouth daily., Disp: 90 tablet, Rfl: 3 .  trimethoprim (TRIMPEX) 100 MG tablet, Take 50 mg by mouth daily. , Disp: , Rfl:  .  [DISCONTINUED] metoprolol succinate (TOPROL-XL) 50 MG 24 hr tablet, Take 50 mg by mouth 2 (two) times daily. Take with or immediately following a meal., Disp: , Rfl:   Filed Vitals:   06/23/15 0837 06/23/15 0838  BP: 142/55 152/52  Pulse: 61   Temp: 97.7 F (36.5 C)   Resp: 16   Height: 5\' 8"  (1.727 m)   Weight: 136 lb (61.689 kg)   SpO2: 100%     Body mass index is 20.68 kg/(m^2).           Review of Systems denies chest pain, dyspnea on exertion, orthopnea, hemoptysis, claudication P     Objective:   Physical Exam BP 152/52 mmHg  Pulse 61  Temp(Src) 97.7 F (36.5 C)  Resp 16  Ht 5\' 8"  (1.727 m)  Wt 136 lb (61.689 kg)  BMI 20.68 kg/m2  SpO2 100%  Gen.-alert and  oriented x3 in no apparent distress HEENT normal for age Lungs no rhonchi or wheezing Cardiovascular regular rhythm no murmurs carotid pulses 3+ palpable no bruits audible Abdomen soft nontender no palpable masses Musculoskeletal free of  major deformities Skin clear -no rashes Neurologic normal Lower extremities 3+ femoral and dorsalis pedis pulses palpable bilaterally with no edema Left upper extremity with excellent pulse and palpable thrill and radial-cephalic AV fistula. The most distal aspect of the cephalic vein is narrowed on physical exam. Left hand is well perfused.  Today I ordered a duplex scan of the left radial-cephalic AV fistula which I reviewed and interpreted. There is some significant narrowing in the cephalic vein due to hyperplasia over a 2-3 cm segment near the arterial anastomosis and otherwise the fistula looks quite good. Empties into the basilic system.       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 He'll schedule this for Thursday, November 17. Discussed with patient and she would like to proceed.

## 2015-07-01 ENCOUNTER — Ambulatory Visit: Payer: Medicare HMO

## 2015-07-01 ENCOUNTER — Ambulatory Visit: Payer: Medicare HMO | Admitting: Oncology

## 2015-07-01 ENCOUNTER — Other Ambulatory Visit: Payer: Medicare HMO

## 2015-07-01 ENCOUNTER — Encounter (HOSPITAL_COMMUNITY): Payer: Self-pay | Admitting: *Deleted

## 2015-07-01 MED ORDER — CHLORHEXIDINE GLUCONATE CLOTH 2 % EX PADS: 6.0000 | MEDICATED_PAD | Freq: Once | CUTANEOUS | Status: DC

## 2015-07-01 MED ORDER — DEXTROSE 5 % IV SOLN
1.5000 g | INTRAVENOUS | Status: AC
  Administered 2015-07-02: 1.5 g via INTRAVENOUS
  Filled 2015-07-01: qty 1.5

## 2015-07-01 MED ORDER — SODIUM CHLORIDE 0.9 % IV SOLN: INTRAVENOUS | Status: DC

## 2015-07-01 NOTE — Progress Notes (Signed)
Pt denies SOB and chest pain but is under the care of Dr. Loralie Champagne, cardiology. Pt made aware to stop taking otc vitamins, herbal medications and NSAID's.  Pt verbalized understanding of all pre-op instructions.

## 2015-07-02 ENCOUNTER — Encounter (HOSPITAL_COMMUNITY)
Admission: RE | Disposition: A | Discharge status: Home / Self Care | Payer: Self-pay | Source: Ambulatory Visit | Attending: Vascular Surgery

## 2015-07-02 ENCOUNTER — Other Ambulatory Visit: Payer: Self-pay

## 2015-07-02 ENCOUNTER — Encounter (HOSPITAL_COMMUNITY): Payer: Self-pay | Admitting: *Deleted

## 2015-07-02 ENCOUNTER — Ambulatory Visit (HOSPITAL_COMMUNITY): Payer: Medicare HMO | Admitting: Anesthesiology

## 2015-07-02 ENCOUNTER — Ambulatory Visit (HOSPITAL_COMMUNITY)
Admission: RE | Admit: 2015-07-02 | Discharge: 2015-07-02 | Disposition: A | Discharge status: Home / Self Care | Payer: Medicare HMO | Source: Ambulatory Visit | Attending: Vascular Surgery | Admitting: Vascular Surgery

## 2015-07-02 DIAGNOSIS — Z87891 Personal history of nicotine dependence: Secondary | ICD-10-CM | POA: Diagnosis not present

## 2015-07-02 DIAGNOSIS — N186 End stage renal disease: Secondary | ICD-10-CM

## 2015-07-02 DIAGNOSIS — I350 Nonrheumatic aortic (valve) stenosis: Secondary | ICD-10-CM | POA: Diagnosis not present

## 2015-07-02 DIAGNOSIS — Z48812 Encounter for surgical aftercare following surgery on the circulatory system: Secondary | ICD-10-CM

## 2015-07-02 DIAGNOSIS — E785 Hyperlipidemia, unspecified: Secondary | ICD-10-CM | POA: Diagnosis not present

## 2015-07-02 DIAGNOSIS — I2510 Atherosclerotic heart disease of native coronary artery without angina pectoris: Secondary | ICD-10-CM | POA: Diagnosis not present

## 2015-07-02 DIAGNOSIS — I120 Hypertensive chronic kidney disease with stage 5 chronic kidney disease or end stage renal disease: Secondary | ICD-10-CM | POA: Insufficient documentation

## 2015-07-02 DIAGNOSIS — I252 Old myocardial infarction: Secondary | ICD-10-CM | POA: Insufficient documentation

## 2015-07-02 DIAGNOSIS — T82898A Other specified complication of vascular prosthetic devices, implants and grafts, initial encounter: Secondary | ICD-10-CM | POA: Diagnosis not present

## 2015-07-02 DIAGNOSIS — E1140 Type 2 diabetes mellitus with diabetic neuropathy, unspecified: Secondary | ICD-10-CM | POA: Diagnosis not present

## 2015-07-02 DIAGNOSIS — E1122 Type 2 diabetes mellitus with diabetic chronic kidney disease: Secondary | ICD-10-CM | POA: Insufficient documentation

## 2015-07-02 DIAGNOSIS — I5032 Chronic diastolic (congestive) heart failure: Secondary | ICD-10-CM | POA: Diagnosis not present

## 2015-07-02 DIAGNOSIS — Y832 Surgical operation with anastomosis, bypass or graft as the cause of abnormal reaction of the patient, or of later complication, without mention of misadventure at the time of the procedure: Secondary | ICD-10-CM | POA: Insufficient documentation

## 2015-07-02 DIAGNOSIS — T82858A Stenosis of vascular prosthetic devices, implants and grafts, initial encounter: Secondary | ICD-10-CM | POA: Diagnosis present

## 2015-07-02 DIAGNOSIS — Z992 Dependence on renal dialysis: Secondary | ICD-10-CM | POA: Insufficient documentation

## 2015-07-02 HISTORY — PX: REVISON OF ARTERIOVENOUS FISTULA: SHX6074

## 2015-07-02 LAB — GLUCOSE, CAPILLARY
GLUCOSE-CAPILLARY: 90 mg/dL (ref 65–99)
GLUCOSE-CAPILLARY: 110 mg/dL — AB (ref 65–99)

## 2015-07-02 LAB — POCT I-STAT 4, (NA,K, GLUC, HGB,HCT)
GLUCOSE: 105 mg/dL — AB (ref 65–99)
HEMATOCRIT: 29 % — AB (ref 36.0–46.0)
Hemoglobin: 9.9 g/dL — ABNORMAL LOW (ref 12.0–15.0)
Potassium: 4.4 mmol/L (ref 3.5–5.1)
SODIUM: 133 mmol/L — AB (ref 135–145)

## 2015-07-02 SURGERY — REVISON OF ARTERIOVENOUS FISTULA
Anesthesia: Monitor Anesthesia Care | Site: Arm Lower | Laterality: Left

## 2015-07-02 MED ORDER — LIDOCAINE-EPINEPHRINE (PF) 1 %-1:200000 IJ SOLN
INTRAMUSCULAR | Status: DC | PRN
Start: 2015-07-02 — End: 2015-07-02
  Administered 2015-07-02: 4 mL

## 2015-07-02 MED ORDER — ROCURONIUM BROMIDE 50 MG/5ML IV SOLN
INTRAVENOUS | Status: AC
  Filled 2015-07-02: qty 1

## 2015-07-02 MED ORDER — SODIUM CHLORIDE 0.9 % IV SOLN
INTRAVENOUS | Status: DC | PRN
  Administered 2015-07-02: 500 mL

## 2015-07-02 MED ORDER — PHENYLEPHRINE HCL 10 MG/ML IJ SOLN
10.0000 mg | INTRAMUSCULAR | Status: DC | PRN
  Administered 2015-07-02: 10 ug/min via INTRAVENOUS

## 2015-07-02 MED ORDER — DEXMEDETOMIDINE HCL IN NACL 200 MCG/50ML IV SOLN
INTRAVENOUS | Status: AC
  Filled 2015-07-02: qty 50

## 2015-07-02 MED ORDER — ARTIFICIAL TEARS OP OINT
TOPICAL_OINTMENT | OPHTHALMIC | Status: AC
  Filled 2015-07-02: qty 3.5

## 2015-07-02 MED ORDER — PROPOFOL 10 MG/ML IV BOLUS
INTRAVENOUS | Status: AC
  Filled 2015-07-02: qty 20

## 2015-07-02 MED ORDER — OXYCODONE-ACETAMINOPHEN 5-325 MG PO TABS: 1.0000 | ORAL_TABLET | Freq: Four times a day (QID) | ORAL | Status: DC | PRN

## 2015-07-02 MED ORDER — MIDAZOLAM HCL 2 MG/2ML IJ SOLN
INTRAMUSCULAR | Status: AC
  Filled 2015-07-02: qty 2

## 2015-07-02 MED ORDER — FENTANYL CITRATE (PF) 100 MCG/2ML IJ SOLN
INTRAMUSCULAR | Status: DC | PRN
  Administered 2015-07-02 (×2): 50 ug via INTRAVENOUS

## 2015-07-02 MED ORDER — LACTATED RINGERS IV SOLN
INTRAVENOUS | Status: DC | PRN
  Administered 2015-07-02: 07:00:00 via INTRAVENOUS

## 2015-07-02 MED ORDER — FENTANYL CITRATE (PF) 250 MCG/5ML IJ SOLN
INTRAMUSCULAR | Status: AC
  Filled 2015-07-02: qty 5

## 2015-07-02 MED ORDER — DEXMEDETOMIDINE HCL IN NACL 400 MCG/100ML IV SOLN
INTRAVENOUS | Status: DC | PRN
  Administered 2015-07-02: .5 ug/kg/h via INTRAVENOUS

## 2015-07-02 MED ORDER — MIDAZOLAM HCL 5 MG/5ML IJ SOLN
INTRAMUSCULAR | Status: DC | PRN
  Administered 2015-07-02 (×2): 1 mg via INTRAVENOUS

## 2015-07-02 MED ORDER — OXYCODONE HCL 5 MG/5ML PO SOLN: 5.0000 mg | Freq: Once | ORAL | Status: DC | PRN

## 2015-07-02 MED ORDER — ONDANSETRON HCL 4 MG/2ML IJ SOLN
INTRAMUSCULAR | Status: AC
  Filled 2015-07-02: qty 2

## 2015-07-02 MED ORDER — FENTANYL CITRATE (PF) 100 MCG/2ML IJ SOLN: 25.0000 ug | INTRAMUSCULAR | Status: DC | PRN

## 2015-07-02 MED ORDER — ONDANSETRON HCL 4 MG/2ML IJ SOLN
INTRAMUSCULAR | Status: DC | PRN
  Administered 2015-07-02 (×2): 2 mg via INTRAVENOUS

## 2015-07-02 MED ORDER — OXYCODONE HCL 5 MG PO TABS: 5.0000 mg | ORAL_TABLET | Freq: Once | ORAL | Status: DC | PRN

## 2015-07-02 MED ORDER — LIDOCAINE HCL (PF) 1 % IJ SOLN
INTRAMUSCULAR | Status: AC
  Filled 2015-07-02: qty 30

## 2015-07-02 MED ORDER — LIDOCAINE-EPINEPHRINE (PF) 1 %-1:200000 IJ SOLN
INTRAMUSCULAR | Status: AC
  Filled 2015-07-02: qty 30

## 2015-07-02 MED ORDER — LIDOCAINE HCL (CARDIAC) 20 MG/ML IV SOLN
INTRAVENOUS | Status: AC
  Filled 2015-07-02: qty 5

## 2015-07-02 MED ORDER — PROPOFOL 500 MG/50ML IV EMUL
INTRAVENOUS | Status: DC | PRN
  Administered 2015-07-02: 25 ug/kg/min via INTRAVENOUS

## 2015-07-02 MED ORDER — ONDANSETRON HCL 4 MG/2ML IJ SOLN: 4.0000 mg | Freq: Once | INTRAMUSCULAR | Status: DC | PRN

## 2015-07-02 MED ORDER — PHENYLEPHRINE 40 MCG/ML (10ML) SYRINGE FOR IV PUSH (FOR BLOOD PRESSURE SUPPORT)
PREFILLED_SYRINGE | INTRAVENOUS | Status: AC
  Filled 2015-07-02: qty 10

## 2015-07-02 MED ORDER — 0.9 % SODIUM CHLORIDE (POUR BTL) OPTIME
TOPICAL | Status: DC | PRN
  Administered 2015-07-02: 1000 mL

## 2015-07-02 SURGICAL SUPPLY — 32 items
CANISTER SUCTION 2500CC (MISCELLANEOUS) ×2 IMPLANT
CLIP TI MEDIUM 6 (CLIP) ×2 IMPLANT
CLIP TI WIDE RED SMALL 6 (CLIP) ×2 IMPLANT
COVER PROBE W GEL 5X96 (DRAPES) IMPLANT
ELECT REM PT RETURN 9FT ADLT (ELECTROSURGICAL) ×2
ELECTRODE REM PT RTRN 9FT ADLT (ELECTROSURGICAL) ×1 IMPLANT
GAUZE SPONGE 4X4 12PLY STRL (GAUZE/BANDAGES/DRESSINGS) ×2 IMPLANT
GEL ULTRASOUND 20GR AQUASONIC (MISCELLANEOUS) IMPLANT
GLOVE BIO SURGEON STRL SZ7 (GLOVE) ×2 IMPLANT
GLOVE BIOGEL PI IND STRL 6.5 (GLOVE) ×5 IMPLANT
GLOVE BIOGEL PI IND STRL 7.5 (GLOVE) ×2 IMPLANT
GLOVE BIOGEL PI IND STRL 8 (GLOVE) ×1 IMPLANT
GLOVE BIOGEL PI INDICATOR 6.5 (GLOVE) ×5
GLOVE BIOGEL PI INDICATOR 7.5 (GLOVE) ×2
GLOVE BIOGEL PI INDICATOR 8 (GLOVE) ×1
GLOVE ECLIPSE 6.5 STRL STRAW (GLOVE) ×6 IMPLANT
GLOVE ECLIPSE 7.0 STRL STRAW (GLOVE) ×2 IMPLANT
GLOVE SS BIOGEL STRL SZ 7 (GLOVE) ×1 IMPLANT
GLOVE SUPERSENSE BIOGEL SZ 7 (GLOVE) ×1
GOWN STRL REUS W/ TWL LRG LVL3 (GOWN DISPOSABLE) ×5 IMPLANT
GOWN STRL REUS W/TWL LRG LVL3 (GOWN DISPOSABLE) ×5
KIT BASIN OR (CUSTOM PROCEDURE TRAY) ×2 IMPLANT
KIT ROOM TURNOVER OR (KITS) ×2 IMPLANT
LIQUID BAND (GAUZE/BANDAGES/DRESSINGS) ×2 IMPLANT
NS IRRIG 1000ML POUR BTL (IV SOLUTION) ×2 IMPLANT
PACK CV ACCESS (CUSTOM PROCEDURE TRAY) ×2 IMPLANT
PAD ARMBOARD 7.5X6 YLW CONV (MISCELLANEOUS) ×4 IMPLANT
SUT PROLENE 6 0 BV (SUTURE) ×4 IMPLANT
SUT VIC AB 3-0 SH 27 (SUTURE) ×1
SUT VIC AB 3-0 SH 27X BRD (SUTURE) ×1 IMPLANT
UNDERPAD 30X30 INCONTINENT (UNDERPADS AND DIAPERS) ×2 IMPLANT
WATER STERILE IRR 1000ML POUR (IV SOLUTION) ×2 IMPLANT

## 2015-07-02 NOTE — Progress Notes (Signed)
Report given to robin rn as caregiver 

## 2015-07-02 NOTE — Transfer of Care (Signed)
Immediate Anesthesia Transfer of Care Note  Patient: [name]  Procedure(s) Performed: Procedure(s): REVISON OF LEFT RADIOCEPHALIC ARTERIOVENOUS FISTULA (Left)  Patient Location: PACU  Anesthesia Type:MAC  Level of Consciousness: awake, oriented, sedated, patient cooperative and responds to stimulation  Airway & Oxygen Therapy: Patient Spontanous Breathing and Patient connected to face mask oxygen  Post-op Assessment: Report given to RN, Post -op Vital signs reviewed and stable, Patient moving all extremities and Patient moving all extremities X 4  Post vital signs: Reviewed and stable  Last Vitals:  Filed Vitals:   07/02/15 0611  BP: 144/52  Pulse: 55  Temp: 36.1 C  Resp: 16    Complications: No apparent anesthesia complications

## 2015-07-02 NOTE — Anesthesia Postprocedure Evaluation (Signed)
  Anesthesia Post-op Note  Patient: [name]  Procedure(s) Performed: Procedure(s): REVISON OF LEFT RADIOCEPHALIC ARTERIOVENOUS FISTULA (Left)  Patient Location: PACU  Anesthesia Type:MAC  Level of Consciousness: awake, alert  and oriented  Airway and Oxygen Therapy: Patient Spontanous Breathing  Post-op Pain: none  Post-op Assessment: Post-op Vital signs reviewed, Patient's Cardiovascular Status Stable, Respiratory Function Stable, Patent Airway and Pain level controlled              Post-op Vital Signs: stable  Last Vitals:  Filed Vitals:   07/02/15 0915  BP: 95/42  Pulse: 51  Temp:   Resp: 12    Complications: No apparent anesthesia complications

## 2015-07-02 NOTE — OR Nursing (Signed)
Maureen PA at bs to evaluate revision. Flow easily dopplerable and bruit and thrill solicited easily with occlusion of venous flow in antecubital area. Dr. Kellie Simmering updated.

## 2015-07-02 NOTE — Interval H&P Note (Signed)
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 After consideration of risks, benefits and other options for treatment, the patient has consented to  Procedure(s): REVISON OF RADIOCEPHALIC ARTERIOVENOUS FISTULA (Left) as a surgical intervention .  The patient's history has been reviewed, patient examined, no change in status, stable for surgery.  I have reviewed the patient's chart and labs.  Questions were answered to the patient's satisfaction.     Tinnie Gens

## 2015-07-02 NOTE — Op Note (Signed)
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 Patient was under conscious sedation. The radial-cephalic AV fistula was widely patent other than a 2-3 cm segment of intimal hyperplasia adjacent to the anastomosis to the radial artery. This was all dissected free with proximal control the radial artery being obtained about 4 cm more proximally. The fistula was then ligated flush with the radial artery preserving flow distally. About a 2 cm segment of the cephalic vein was excised and the vein was 3 mm proximal to this point. A new site on the radial artery for reimplantation of the cephalic vein was identified artery occluded proximal and distally with Vesseloops opened 15 blade extended with Potts scissors. It was excellent inflow. Was a 2 and 3 mm artery which was mildly diseased. Vein was carefully measured spatulated and anastomosed inside with 6-0 Prolene. Vesseloops released there was excellent pulse and palpable thrill up to the antecubital area with good Doppler flow. No heparin or protamine was given. Wounds closed in layers with Vicryl subcuticular fashion with Dermabond patient taken to recovery in satisfactory condition   Tinnie Gens, MD 07/02/2015 8:51 AM

## 2015-07-02 NOTE — H&P (View-Only) (Signed)
Subjective:     Patient ID: [name], female   DOB: 1940/12/06, 74 y.o.   MRN: [id number]  HPI this 74 year old female had a left radial-cephalic AV fistula created by me in June 2016. 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 She denies pain or numbness in the left hand. This is her initial follow-up appointment.  Past Medical History  Diagnosis Date  . Hypertension   . Hyperlipidemia   . Diabetes mellitus   . Coronary artery disease     Pt presented 2/10 to [facility] with NSTEMI and diastolic CHF exacerbation.  LHC was done  3/10 showing 99% pRCA stenosis and 80% calcified pLAD stenosis with L=>R collaterals.  Pt was referred  for CABG which was done by Dr. Prescott Gum with LIMA-LAD, SVG-RCA, SVG-OM.  [name] Anxiety   . Chronic low back pain   . Anemia     Pt is taking iron.   . Mild aortic stenosis     mean gradient 12 mmHg in 2/12.  . Thrombocytopenia ([place])   . Carotid stenosis     40-59% bilateral ICA stenosis in 2/12.  . Diastolic CHF ([place])     Echo (2/10) showed EF 55-65%, mild LVH, diastolic dysfunction, mild AS with mean gradient 12 mmHg, PASP 43 mmHg.  Echo (2/12): EF 55-60%, mild LVH, mild AS (mean gradient 12), PA systolic pressure 32 mmHg.     [name] PONV (postoperative nausea and vomiting)   . Myocardial infarction ([place])     "mild"  . Heart murmur   . Pneumonia   . GERD (gastroesophageal reflux disease)   . Diabetic neuropathy ([place])   . Unsteady gait   . Arthritis   . CKD (chronic kidney disease)     Dr. Audie Clear at [facility]    Social History  Substance Use Topics  . Smoking status: Former Smoker    Types: Cigarettes  . Smokeless tobacco: Never Used     Comment: quit 1988  . Alcohol Use: Yes     Comment: beer    Family History  Problem Relation Age of Onset  . Adopted: Yes  . Family history unknown: Yes    Allergies  Allergen Reactions  . Lipitor [Atorvastatin]     Stomach pain  . Strawberry Extract Rash      Current outpatient prescriptions:  .  ALPRAZolam (XANAX) 0.5 MG tablet, Take 0.5 mg by mouth daily., Disp: , Rfl:  .  amLODipine (NORVASC) 5 MG tablet, Take 1 tablet (5 mg total) by mouth daily., Disp: 90 tablet, Rfl: 3 .  aspirin 81 MG tablet, Take 81 mg by mouth daily. , Disp: , Rfl:  .  calcium acetate (PHOSLO) 667 MG capsule, Take 1,334 mg by mouth 2 (two) times daily with a meal. As directed, Disp: , Rfl:  .  carvedilol (COREG) 12.5 MG tablet, Take 1 tablet (12.5 mg total) by mouth 2 (two) times daily., Disp: 180 tablet, Rfl: 3 .  cholecalciferol (VITAMIN D) 1000 UNITS tablet, Take 1,000 Units by mouth daily., Disp: , Rfl:  .  furosemide (LASIX) 20 MG tablet, Take 20 mg by mouth daily. As directed, Disp: , Rfl:  .  losartan (COZAAR) 25 MG tablet, Take 1 tablet (25 mg total) by mouth daily., Disp: 90 tablet, Rfl: 3 .  oxyCODONE (ROXICODONE) 5 MG immediate release tablet, Take 1 tablet (5 mg total) by mouth every 6 (six) hours as needed. (Patient not taking: Reported on 03/17/2015),  Disp: 20 tablet, Rfl: 0 .  rosuvastatin (CRESTOR) 5 MG tablet, Take 1 tablet (5 mg total) by mouth daily., Disp: 90 tablet, Rfl: 3 .  trimethoprim (TRIMPEX) 100 MG tablet, Take 50 mg by mouth daily. , Disp: , Rfl:  .  [DISCONTINUED] metoprolol succinate (TOPROL-XL) 50 MG 24 hr tablet, Take 50 mg by mouth 2 (two) times daily. Take with or immediately following a meal., Disp: , Rfl:   Filed Vitals:   06/23/15 0837 06/23/15 0838  BP: 142/55 152/52  Pulse: 61   Temp: 97.7 F (36.5 C)   Resp: 16   Height: 5\' 8"  (1.727 m)   Weight: 136 lb (61.689 kg)   SpO2: 100%     Body mass index is 20.68 kg/(m^2).           Review of Systems denies chest pain, dyspnea on exertion, orthopnea, hemoptysis, claudication P     Objective:   Physical Exam BP 152/52 mmHg  Pulse 61  Temp(Src) 97.7 F (36.5 C)  Resp 16  Ht 5\' 8"  (1.727 m)  Wt 136 lb (61.689 kg)  BMI 20.68 kg/m2  SpO2 100%  Gen.-alert and  oriented x3 in no apparent distress HEENT normal for age Lungs no rhonchi or wheezing Cardiovascular regular rhythm no murmurs carotid pulses 3+ palpable no bruits audible Abdomen soft nontender no palpable masses Musculoskeletal free of  major deformities Skin clear -no rashes Neurologic normal Lower extremities 3+ femoral and dorsalis pedis pulses palpable bilaterally with no edema Left upper extremity with excellent pulse and palpable thrill and radial-cephalic AV fistula. The most distal aspect of the cephalic vein is narrowed on physical exam. Left hand is well perfused.  Today I ordered a duplex scan of the left radial-cephalic AV fistula which I reviewed and interpreted. There is some significant narrowing in the cephalic vein due to hyperplasia over a 2-3 cm segment near the arterial anastomosis and otherwise the fistula looks quite good. Empties into the basilic system.       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 He'll schedule this for Thursday, November 17. Discussed with patient and she would like to proceed.

## 2015-07-02 NOTE — Anesthesia Preprocedure Evaluation (Addendum)
Anesthesia Evaluation  Patient identified by MRN, date of birth, ID band Patient awake    Reviewed: Allergy & Precautions, NPO status , Patient's Chart, lab work & pertinent test results  Airway Mallampati: II  TM Distance: >3 FB Neck ROM: Full    Dental   Pulmonary former smoker,     + decreased breath sounds      Cardiovascular hypertension,  Rhythm:Regular Rate:Normal     Neuro/Psych    GI/Hepatic   Endo/Other  diabetes  Renal/GU      Musculoskeletal   Abdominal   Peds  Hematology   Anesthesia Other Findings   Reproductive/Obstetrics                            Anesthesia Physical Anesthesia Plan  ASA: III  Anesthesia Plan: MAC   Post-op Pain Management:    Induction: Intravenous  Airway Management Planned:   Additional Equipment:   Intra-op Plan:   Post-operative Plan:   Informed Consent: I have reviewed the patients History and Physical, chart, labs and discussed the procedure including the risks, benefits and alternatives for the proposed anesthesia with the patient or authorized representative who has indicated his/her understanding and acceptance.   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

## 2015-07-02 NOTE — Anesthesia Procedure Notes (Signed)
Procedure Name: MAC Date/Time: 07/02/2015 7:52 AM Performed by: Jacquiline Doe A Pre-anesthesia Checklist: Patient identified, Timeout performed, Emergency Drugs available, Suction available and Patient being monitored Patient Re-evaluated:Patient Re-evaluated prior to inductionOxygen Delivery Method: Simple face mask Intubation Type: IV induction Placement Confirmation: positive ETCO2 Dental Injury: Teeth and Oropharynx as per pre-operative assessment

## 2015-07-03 ENCOUNTER — Encounter (HOSPITAL_COMMUNITY): Payer: Self-pay | Admitting: Vascular Surgery

## 2015-07-03 ENCOUNTER — Ambulatory Visit: Payer: Medicare HMO

## 2015-07-03 ENCOUNTER — Ambulatory Visit: Payer: Medicare HMO | Admitting: Oncology

## 2015-07-03 ENCOUNTER — Other Ambulatory Visit: Payer: Medicare HMO

## 2015-07-06 ENCOUNTER — Telehealth: Payer: Self-pay | Admitting: Vascular Surgery

## 2015-07-06 NOTE — Telephone Encounter (Addendum)
-----   Message from Denman George, RN sent at 07/02/2015 10:37 AM EST ----- Regarding: needs 6 wk f/u with duplex of left arm access, and appt. with JDL   ----- Message -----    From: Ulyses Amor, PA-C    Sent: 07/02/2015  10:32 AM      To: [facility] Charge Pool  Patient needs fistula duplex when they f/u in 6 weeks with Dr. Kellie Simmering s/p revision left forearm av fistula.  notified patient of post op appt. with dr. Kellie Simmering on 08-31-14 at 10 am for lab, then 11 am to see dr. Lupita Raider

## 2015-07-08 ENCOUNTER — Ambulatory Visit: Payer: Medicare HMO

## 2015-07-08 ENCOUNTER — Other Ambulatory Visit: Payer: Self-pay | Admitting: *Deleted

## 2015-07-08 ENCOUNTER — Ambulatory Visit: Payer: Medicare HMO | Admitting: Oncology

## 2015-07-08 ENCOUNTER — Other Ambulatory Visit: Payer: Medicare HMO

## 2015-07-10 ENCOUNTER — Telehealth: Payer: Self-pay | Admitting: Oncology

## 2015-07-10 NOTE — Telephone Encounter (Signed)
Appointments made and rescheduled per patient request   [name]

## 2015-07-11 ENCOUNTER — Other Ambulatory Visit: Payer: Self-pay | Admitting: Nurse Practitioner

## 2015-07-14 ENCOUNTER — Telehealth: Payer: Self-pay | Admitting: Oncology

## 2015-07-14 ENCOUNTER — Ambulatory Visit (HOSPITAL_BASED_OUTPATIENT_CLINIC_OR_DEPARTMENT_OTHER): Payer: Medicare HMO | Admitting: Oncology

## 2015-07-14 ENCOUNTER — Ambulatory Visit (HOSPITAL_BASED_OUTPATIENT_CLINIC_OR_DEPARTMENT_OTHER): Payer: Medicare HMO

## 2015-07-14 ENCOUNTER — Other Ambulatory Visit (HOSPITAL_BASED_OUTPATIENT_CLINIC_OR_DEPARTMENT_OTHER): Payer: Medicare HMO

## 2015-07-14 VITALS — BP 134/49 | HR 62 | Temp 97.5°F | Resp 18 | Ht 68.0 in | Wt 139.2 lb

## 2015-07-14 DIAGNOSIS — D509 Iron deficiency anemia, unspecified: Secondary | ICD-10-CM

## 2015-07-14 DIAGNOSIS — N184 Chronic kidney disease, stage 4 (severe): Secondary | ICD-10-CM

## 2015-07-14 DIAGNOSIS — D696 Thrombocytopenia, unspecified: Secondary | ICD-10-CM | POA: Diagnosis not present

## 2015-07-14 DIAGNOSIS — D631 Anemia in chronic kidney disease: Secondary | ICD-10-CM

## 2015-07-14 DIAGNOSIS — N189 Chronic kidney disease, unspecified: Secondary | ICD-10-CM

## 2015-07-14 DIAGNOSIS — N183 Chronic kidney disease, stage 3 unspecified: Secondary | ICD-10-CM

## 2015-07-14 LAB — CBC WITH DIFFERENTIAL/PLATELET
BASO%: 0.6 % (ref 0.0–2.0)
BASOS ABS: 0 10*3/uL (ref 0.0–0.1)
EOS%: 3.5 % (ref 0.0–7.0)
Eosinophils Absolute: 0.2 10*3/uL (ref 0.0–0.5)
HEMATOCRIT: 25.4 % — AB (ref 34.8–46.6)
HEMOGLOBIN: 8.7 g/dL — AB (ref 11.6–15.9)
LYMPH#: 1.5 10*3/uL (ref 0.9–3.3)
LYMPH%: 24.1 % (ref 14.0–49.7)
MCH: 32.6 pg (ref 25.1–34.0)
MCHC: 34.3 g/dL (ref 31.5–36.0)
MCV: 95.1 fL (ref 79.5–101.0)
MONO#: 0.9 10*3/uL (ref 0.1–0.9)
MONO%: 14.5 % — ABNORMAL HIGH (ref 0.0–14.0)
NEUT#: 3.7 10*3/uL (ref 1.5–6.5)
NEUT%: 57.3 % (ref 38.4–76.8)
Platelets: 103 10*3/uL — ABNORMAL LOW (ref 145–400)
RBC: 2.67 10*6/uL — ABNORMAL LOW (ref 3.70–5.45)
RDW: 13.4 % (ref 11.2–14.5)
WBC: 6.4 10*3/uL (ref 3.9–10.3)
nRBC: 0 % (ref 0–0)

## 2015-07-14 MED ORDER — DARBEPOETIN ALFA 300 MCG/0.6ML IJ SOSY
300.0000 ug | PREFILLED_SYRINGE | Freq: Once | INTRAMUSCULAR | Status: AC
  Administered 2015-07-14: 300 ug via SUBCUTANEOUS
  Filled 2015-07-14: qty 0.6

## 2015-07-14 NOTE — Telephone Encounter (Signed)
Gave and printed aptp sched and avs for pt for DEC thru Feb 2017

## 2015-07-14 NOTE — Progress Notes (Signed)
Hematology and Oncology Follow Up Visit  [name] [id number] 05-30-1941 74 y.o. 07/14/2015 9:19 AM [name], MDWilson, Jeanne Flavors, MD   Principle Diagnosis: This is a 74 year old female with multifactorial anemia. She has anemia of chronic disease, anemia of renal disease, diagnosed in 2011.  Current therapy: Aranesp 300 mcg subcutaneously every 3 weeks keeping hemoglobin above 11.  Interim History:  [name] presents today for a followup visit with her husband. Since the last visit, she has been doing relatively fair. She had a fistula and placed in preparation for possible dialysis although she has not started that at this time. She did miss Aranesp injection in the last 3 weeks and her last injection was on 05/20/2015.   She does report some fatigue and tiredness since the last visit. She does not report any chest pain or difficulty breathing. Has not reported any falls or syncope. She does report unsteadiness but is unrelated to her hemoglobin.   She continues to tolerate Aranesp without any complications. She had not had any injection or infusion problem and denied any toxicities. She has not noticed any bleeding or thrombosis.   She has not reported any headaches or blurry vision or double vision. She does not report any chest pain shortness of breath or difficulty breathing. She does not report any nausea or vomiting or abdominal pain. She does not report any genitourinary complaints. She has not reported any bleeding or clotting tendencies. She has not reported any lymphadenopathy or petechiae. Rest of her review of systems unremarkable.  Medications: I have reviewed the patient's current medications.  Current Outpatient Prescriptions  Medication Sig Dispense Refill  . ALPRAZolam (XANAX) 0.5 MG tablet Take 0.5 mg by mouth at bedtime.     [name] amLODipine (NORVASC) 5 MG tablet Take 1 tablet (5 mg total) by mouth daily. 90 tablet 3  . aspirin 81 MG tablet Take 81 mg by mouth  daily.     . calcium acetate (PHOSLO) 667 MG capsule Take 1,334 mg by mouth 3 (three) times daily with meals. As directed    . carvedilol (COREG) 12.5 MG tablet Take 1 tablet (12.5 mg total) by mouth 2 (two) times daily. 180 tablet 3  . cholecalciferol (VITAMIN D) 1000 UNITS tablet Take 1,000 Units by mouth daily.    . furosemide (LASIX) 20 MG tablet Take 20 mg by mouth daily. As directed    . losartan (COZAAR) 25 MG tablet Take 1 tablet (25 mg total) by mouth daily. 90 tablet 3  . rosuvastatin (CRESTOR) 5 MG tablet Take 1 tablet (5 mg total) by mouth daily. 90 tablet 3  . sodium bicarbonate 650 MG tablet Take 650 mg by mouth 2 (two) times daily.    [name] trimethoprim (TRIMPEX) 100 MG tablet Take 50 mg by mouth daily.     . VELTASSA 8.4 G packet Take 8.4 g by mouth daily after lunch.     . [DISCONTINUED] metoprolol succinate (TOPROL-XL) 50 MG 24 hr tablet Take 50 mg by mouth 2 (two) times daily. Take with or immediately following a meal.     No current facility-administered medications for this visit.     Allergies:  Allergies  Allergen Reactions  . Lipitor [Atorvastatin]     Stomach pain  . Strawberry Extract Rash    Past Medical History, Surgical history, Social history, and Family History were reviewed and updated.    Physical Exam: Blood pressure 134/49, pulse 62, temperature 97.5 F (36.4 C), temperature source Oral, resp.  rate 18, height 5\' 8"  (1.727 m), weight 139 lb 3.2 oz (63.141 kg), SpO2 100 %. ECOG: 1 General appearance: alert and cooperative chronically ill-appearing without distress today.  Head: Normocephalic, without obvious abnormality no oral ulcers or lesions.  Neck: no adenopathy and no carotid bruit Lymph nodes: Cervical, supraclavicular, and axillary nodes normal. Heart:regular rate and rhythm, S1, S2 normal, no murmur, click, rub or gallop Lung:chest clear, no wheezing, rales, normal symmetric air entry.  Abdomen: soft, non-tender, without masses or  organomegaly  shifting dullness or ascites. Slightly distended. EXT:no erythema, induration, or nodules   Lab Results: Lab Results  Component Value Date   WBC 6.4 07/14/2015   HGB 8.7* 07/14/2015   HCT 25.4* 07/14/2015   MCV 95.1 07/14/2015   PLT 103* 07/14/2015     Chemistry      Component Value Date/Time   NA 133* 07/02/2015 0651   NA 134* 07/09/2014 0934   K 4.4 07/02/2015 0651   K 5.1 07/09/2014 0934   CL 105 12/20/2013 1103   CL 101 08/16/2012 0757   CO2 21* 07/09/2014 0934   CO2 24 12/20/2013 1103   BUN 47.9* 07/09/2014 0934   BUN 29* 12/20/2013 1103   CREATININE 2.9* 07/09/2014 0934   CREATININE 2.4* 12/20/2013 1103      Component Value Date/Time   CALCIUM 9.6 07/09/2014 0934   CALCIUM 10.1 12/20/2013 1103   ALKPHOS 69 01/20/2015 0949   ALKPHOS 100 07/09/2014 0934   AST 14 01/20/2015 0949   AST 24 07/09/2014 0934   ALT 12 01/20/2015 0949   ALT 21 07/09/2014 0934   BILITOT 0.5 01/20/2015 0949   BILITOT 0.44 07/09/2014 0934     Impression and Plan:   74 year old female with the following issues:   1. Anemia of renal disease as well as chronic disease. We will keep her on Aranesp, keep her hemoglobin above 11. Hemoglobin is  8.7 after missing Aranesp 3 weeks ago. She is a mildly symptomatic but does not require packed red cell transfusions. The plan is to proceed with Aranesp today and every 3 weeks to get her hemoglobin close to 11. This has happened in the past and she responds well to Aranesp at this time. If she  develops any symptoms of chest pain or difficulty breathing or presyncope to report any symptoms and she might require transfusion at that time.   2. 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  Her platelet count is stable. No major bleeding at this time. However, we will continue to monitor her blood counts closely.   3. Follow-up: CBC and Aranesp injection every 3 weeks. Visit in  3  months.      [name] 11/29/20169:19 AM

## 2015-08-05 ENCOUNTER — Ambulatory Visit (HOSPITAL_BASED_OUTPATIENT_CLINIC_OR_DEPARTMENT_OTHER): Payer: Medicare HMO

## 2015-08-05 ENCOUNTER — Other Ambulatory Visit (HOSPITAL_BASED_OUTPATIENT_CLINIC_OR_DEPARTMENT_OTHER): Payer: Medicare HMO

## 2015-08-05 VITALS — BP 142/51 | HR 65 | Temp 97.6°F | Resp 18

## 2015-08-05 DIAGNOSIS — N184 Chronic kidney disease, stage 4 (severe): Secondary | ICD-10-CM

## 2015-08-05 DIAGNOSIS — N189 Chronic kidney disease, unspecified: Secondary | ICD-10-CM

## 2015-08-05 DIAGNOSIS — D631 Anemia in chronic kidney disease: Secondary | ICD-10-CM

## 2015-08-05 DIAGNOSIS — D509 Iron deficiency anemia, unspecified: Secondary | ICD-10-CM

## 2015-08-05 DIAGNOSIS — D696 Thrombocytopenia, unspecified: Secondary | ICD-10-CM

## 2015-08-05 LAB — CBC WITH DIFFERENTIAL/PLATELET
BASO%: 0.8 % (ref 0.0–2.0)
Basophils Absolute: 0 10*3/uL (ref 0.0–0.1)
EOS%: 4.1 % (ref 0.0–7.0)
Eosinophils Absolute: 0.2 10*3/uL (ref 0.0–0.5)
HCT: 26.2 % — ABNORMAL LOW (ref 34.8–46.6)
HGB: 8.5 g/dL — ABNORMAL LOW (ref 11.6–15.9)
LYMPH%: 26 % (ref 14.0–49.7)
MCH: 33.2 pg (ref 25.1–34.0)
MCHC: 32.4 g/dL (ref 31.5–36.0)
MCV: 102.3 fL — ABNORMAL HIGH (ref 79.5–101.0)
MONO#: 0.6 10*3/uL (ref 0.1–0.9)
MONO%: 16 % — AB (ref 0.0–14.0)
NEUT%: 53.1 % (ref 38.4–76.8)
NEUTROS ABS: 2.1 10*3/uL (ref 1.5–6.5)
PLATELETS: 91 10*3/uL — AB (ref 145–400)
RBC: 2.56 10*6/uL — AB (ref 3.70–5.45)
RDW: 15.4 % — ABNORMAL HIGH (ref 11.2–14.5)
WBC: 3.9 10*3/uL (ref 3.9–10.3)
lymph#: 1 10*3/uL (ref 0.9–3.3)
nRBC: 0 % (ref 0–0)

## 2015-08-05 MED ORDER — DARBEPOETIN ALFA 300 MCG/0.6ML IJ SOSY
300.0000 ug | PREFILLED_SYRINGE | Freq: Once | INTRAMUSCULAR | Status: AC
  Administered 2015-08-05: 300 ug via SUBCUTANEOUS
  Filled 2015-08-05: qty 0.6

## 2015-08-05 NOTE — Patient Instructions (Signed)
Darbepoetin Alfa injection What is this medicine? DARBEPOETIN ALFA (dar be POE e tin AL fa) helps your body make more red blood cells. It is used to treat anemia caused by chronic kidney failure and chemotherapy. This medicine may be used for other purposes; ask your health care provider or pharmacist if you have questions. COMMON BRAND NAME(S): Aranesp What should I tell my health care provider before I take this medicine? They need to know if you have any of these conditions: -blood clotting disorders or history of blood clots -cancer patient not on chemotherapy -cystic fibrosis -heart disease, such as angina, heart failure, or a history of a heart attack -hemoglobin level of 12 g/dL or greater -high blood pressure -low levels of folate, iron, or vitamin B12 -seizures -an unusual or allergic reaction to darbepoetin, erythropoietin, albumin, hamster proteins, latex, other medicines, foods, dyes, or preservatives -pregnant or trying to get pregnant -breast-feeding How should I use this medicine? This medicine is for injection into a vein or under the skin. It is usually given by a health care professional in a hospital or clinic setting. If you get this medicine at home, you will be taught how to prepare and give this medicine. Do not shake the solution before you withdraw a dose. Use exactly as directed. Take your medicine at regular intervals. Do not take your medicine more often than directed. It is important that you put your used needles and syringes in a special sharps container. Do not put them in a trash can. If you do not have a sharps container, call your pharmacist or healthcare provider to get one. Talk to your pediatrician regarding the use of this medicine in children. While this medicine may be used in children as young as 1 year for selected conditions, precautions do apply. Overdosage: If you think you have taken too much of this medicine contact a poison control center or  emergency room at once. NOTE: This medicine is only for you. Do not share this medicine with others. What if I miss a dose? If you miss a dose, take it as soon as you can. If it is almost time for your next dose, take only that dose. Do not take double or extra doses. What may interact with this medicine? Do not take this medicine with any of the following medications: -epoetin alfa This list may not describe all possible interactions. Give your health care provider a list of all the medicines, herbs, non-prescription drugs, or dietary supplements you use. Also tell them if you smoke, drink alcohol, or use illegal drugs. Some items may interact with your medicine. What should I watch for while using this medicine? Visit your prescriber or health care professional for regular checks on your progress and for the needed blood tests and blood pressure measurements. It is especially important for the doctor to make sure your hemoglobin level is in the desired range, to limit the risk of potential side effects and to give you the best benefit. Keep all appointments for any recommended tests. Check your blood pressure as directed. Ask your doctor what your blood pressure should be and when you should contact him or her. As your body makes more red blood cells, you may need to take iron, folic acid, or vitamin B supplements. Ask your doctor or health care provider which products are right for you. If you have kidney disease continue dietary restrictions, even though this medication can make you feel better. Talk with your doctor or health   care professional about the foods you eat and the vitamins that you take. What side effects may I notice from receiving this medicine? Side effects that you should report to your doctor or health care professional as soon as possible: -allergic reactions like skin rash, itching or hives, swelling of the face, lips, or tongue -breathing problems -changes in vision -chest  pain -confusion, trouble speaking or understanding -feeling faint or lightheaded, falls -high blood pressure -muscle aches or pains -pain, swelling, warmth in the leg -rapid weight gain -severe headaches -sudden numbness or weakness of the face, arm or leg -trouble walking, dizziness, loss of balance or coordination -seizures (convulsions) -swelling of the ankles, feet, hands -unusually weak or tired Side effects that usually do not require medical attention (report to your doctor or health care professional if they continue or are bothersome): -diarrhea -fever, chills (flu-like symptoms) -headaches -nausea, vomiting -redness, stinging, or swelling at site where injected This list may not describe all possible side effects. Call your doctor for medical advice about side effects. You may report side effects to FDA at 1-800-FDA-1088. Where should I keep my medicine? Keep out of the reach of children. Store in a refrigerator between 2 and 8 degrees C (36 and 46 degrees F). Do not freeze. Do not shake. Throw away any unused portion if using a single-dose vial. Throw away any unused medicine after the expiration date. NOTE: This sheet is a summary. It may not cover all possible information. If you have questions about this medicine, talk to your doctor, pharmacist, or health care provider.  2015, [employer]. (2008-07-15 10:23:57)  

## 2015-08-25 ENCOUNTER — Other Ambulatory Visit: Payer: Self-pay | Admitting: *Deleted

## 2015-08-25 DIAGNOSIS — D649 Anemia, unspecified: Secondary | ICD-10-CM

## 2015-08-26 ENCOUNTER — Other Ambulatory Visit (HOSPITAL_BASED_OUTPATIENT_CLINIC_OR_DEPARTMENT_OTHER): Payer: Medicare HMO

## 2015-08-26 ENCOUNTER — Ambulatory Visit (HOSPITAL_BASED_OUTPATIENT_CLINIC_OR_DEPARTMENT_OTHER): Payer: Medicare HMO

## 2015-08-26 VITALS — BP 125/56 | HR 56 | Temp 98.0°F

## 2015-08-26 DIAGNOSIS — D509 Iron deficiency anemia, unspecified: Secondary | ICD-10-CM

## 2015-08-26 DIAGNOSIS — N189 Chronic kidney disease, unspecified: Secondary | ICD-10-CM

## 2015-08-26 DIAGNOSIS — D631 Anemia in chronic kidney disease: Secondary | ICD-10-CM

## 2015-08-26 DIAGNOSIS — D696 Thrombocytopenia, unspecified: Secondary | ICD-10-CM

## 2015-08-26 DIAGNOSIS — D649 Anemia, unspecified: Secondary | ICD-10-CM

## 2015-08-26 DIAGNOSIS — N184 Chronic kidney disease, stage 4 (severe): Secondary | ICD-10-CM

## 2015-08-26 LAB — CBC WITH DIFFERENTIAL/PLATELET
BASO%: 0.4 % (ref 0.0–2.0)
Basophils Absolute: 0 10*3/uL (ref 0.0–0.1)
EOS%: 4.4 % (ref 0.0–7.0)
Eosinophils Absolute: 0.2 10*3/uL (ref 0.0–0.5)
HEMATOCRIT: 30.6 % — AB (ref 34.8–46.6)
HGB: 10.1 g/dL — ABNORMAL LOW (ref 11.6–15.9)
LYMPH#: 1.2 10*3/uL (ref 0.9–3.3)
LYMPH%: 25.2 % (ref 14.0–49.7)
MCH: 33.7 pg (ref 25.1–34.0)
MCHC: 33 g/dL (ref 31.5–36.0)
MCV: 102 fL — ABNORMAL HIGH (ref 79.5–101.0)
MONO#: 0.7 10*3/uL (ref 0.1–0.9)
MONO%: 15 % — ABNORMAL HIGH (ref 0.0–14.0)
NEUT%: 55 % (ref 38.4–76.8)
NEUTROS ABS: 2.6 10*3/uL (ref 1.5–6.5)
Platelets: 99 10*3/uL — ABNORMAL LOW (ref 145–400)
RBC: 3 10*6/uL — AB (ref 3.70–5.45)
RDW: 14 % (ref 11.2–14.5)
WBC: 4.7 10*3/uL (ref 3.9–10.3)
nRBC: 0 % (ref 0–0)

## 2015-08-26 MED ORDER — DARBEPOETIN ALFA 300 MCG/0.6ML IJ SOSY
300.0000 ug | PREFILLED_SYRINGE | Freq: Once | INTRAMUSCULAR | Status: AC
  Administered 2015-08-26: 300 ug via SUBCUTANEOUS
  Filled 2015-08-26: qty 0.6

## 2015-08-28 ENCOUNTER — Encounter: Payer: Self-pay | Admitting: Vascular Surgery

## 2015-08-28 DIAGNOSIS — N185 Chronic kidney disease, stage 5: Secondary | ICD-10-CM | POA: Diagnosis not present

## 2015-09-01 ENCOUNTER — Ambulatory Visit (HOSPITAL_COMMUNITY)
Admission: RE | Admit: 2015-09-01 | Discharge: 2015-09-01 | Disposition: A | Discharge status: Home / Self Care | Payer: Medicare HMO | Source: Ambulatory Visit | Attending: Vascular Surgery | Admitting: Vascular Surgery

## 2015-09-01 ENCOUNTER — Encounter: Payer: Self-pay | Admitting: Vascular Surgery

## 2015-09-01 ENCOUNTER — Ambulatory Visit (INDEPENDENT_AMBULATORY_CARE_PROVIDER_SITE_OTHER): Payer: Medicare HMO | Admitting: Vascular Surgery

## 2015-09-01 VITALS — BP 139/68 | HR 55 | Temp 97.6°F | Resp 14 | Ht 68.0 in | Wt 138.0 lb

## 2015-09-01 DIAGNOSIS — Z48812 Encounter for surgical aftercare following surgery on the circulatory system: Secondary | ICD-10-CM

## 2015-09-01 DIAGNOSIS — N186 End stage renal disease: Secondary | ICD-10-CM

## 2015-09-01 DIAGNOSIS — E1140 Type 2 diabetes mellitus with diabetic neuropathy, unspecified: Secondary | ICD-10-CM | POA: Diagnosis not present

## 2015-09-01 DIAGNOSIS — E785 Hyperlipidemia, unspecified: Secondary | ICD-10-CM | POA: Diagnosis not present

## 2015-09-01 DIAGNOSIS — E1122 Type 2 diabetes mellitus with diabetic chronic kidney disease: Secondary | ICD-10-CM | POA: Diagnosis not present

## 2015-09-01 DIAGNOSIS — I120 Hypertensive chronic kidney disease with stage 5 chronic kidney disease or end stage renal disease: Secondary | ICD-10-CM | POA: Insufficient documentation

## 2015-09-01 NOTE — Progress Notes (Signed)
Filed Vitals:   09/01/15 1036 09/01/15 1040  BP: 147/64 139/68  Pulse: 57 55  Temp: 97.6 F (36.4 C)   TempSrc: Oral   Resp: 14   Height: 5\' 8"  (1.727 m)   Weight: 138 lb (62.596 kg)   SpO2: 100%

## 2015-09-01 NOTE — Progress Notes (Signed)
Subjective:     Patient ID: [name], female   DOB: 01-27-1941, 76 y.o.   MRN: [id number]  HPI This 75 year old female returns -8 weeks post revision of left radial-cephalic AV fistula. Patient had intimal hyperplasia at the arterial anastomosis and the fistula was reattached to the radial artery slightly more proximally. It has functioned nicely since then and the patient noticed a stronger pulse and thrill in the fistula. She has a mild amount of numbness at the base of the left thumb which is chronic.   Review of Systems     Objective:   Physical Exam BP 139/68 mmHg  Pulse 55  Temp(Src) 97.6 F (36.4 C) (Oral)  Resp 14  Ht 5\' 8"  (1.727 m)  Wt 138 lb (62.596 kg)  BMI 20.99 kg/m2  SpO2 100%   general thin female in no apparent distress alert and oriented 3 Left upper extremity was excellent pulse and palpable thrill and radialcephalic AV fistula. Incision well-healed. Left hand nicely perfused.   today I ordered a duplex scan of the left radial-cephalic AV fistula which reveals excellent flow and a good diameter of the vein.    Assessment:      good result following revision of left radial-cephalic AV fistula with intimal hyperplasia at arterial anastomosis which was corrected with proximal reattachment     Plan:      return to see me on when necessary basis

## 2015-09-03 DIAGNOSIS — E875 Hyperkalemia: Secondary | ICD-10-CM | POA: Diagnosis not present

## 2015-09-03 DIAGNOSIS — E1122 Type 2 diabetes mellitus with diabetic chronic kidney disease: Secondary | ICD-10-CM | POA: Diagnosis not present

## 2015-09-03 DIAGNOSIS — I120 Hypertensive chronic kidney disease with stage 5 chronic kidney disease or end stage renal disease: Secondary | ICD-10-CM | POA: Diagnosis not present

## 2015-09-03 DIAGNOSIS — N2581 Secondary hyperparathyroidism of renal origin: Secondary | ICD-10-CM | POA: Diagnosis not present

## 2015-09-03 DIAGNOSIS — N185 Chronic kidney disease, stage 5: Secondary | ICD-10-CM | POA: Diagnosis not present

## 2015-09-15 ENCOUNTER — Other Ambulatory Visit: Payer: Self-pay | Admitting: Oncology

## 2015-09-15 ENCOUNTER — Other Ambulatory Visit: Payer: Self-pay | Admitting: *Deleted

## 2015-09-15 DIAGNOSIS — N189 Chronic kidney disease, unspecified: Principal | ICD-10-CM

## 2015-09-15 DIAGNOSIS — D631 Anemia in chronic kidney disease: Secondary | ICD-10-CM

## 2015-09-15 DIAGNOSIS — D649 Anemia, unspecified: Secondary | ICD-10-CM

## 2015-09-16 ENCOUNTER — Other Ambulatory Visit (HOSPITAL_BASED_OUTPATIENT_CLINIC_OR_DEPARTMENT_OTHER): Payer: Medicare HMO

## 2015-09-16 ENCOUNTER — Ambulatory Visit (HOSPITAL_BASED_OUTPATIENT_CLINIC_OR_DEPARTMENT_OTHER): Payer: Medicare HMO

## 2015-09-16 VITALS — BP 133/49 | HR 58 | Temp 98.1°F

## 2015-09-16 DIAGNOSIS — D631 Anemia in chronic kidney disease: Secondary | ICD-10-CM

## 2015-09-16 DIAGNOSIS — N189 Chronic kidney disease, unspecified: Secondary | ICD-10-CM

## 2015-09-16 DIAGNOSIS — D696 Thrombocytopenia, unspecified: Secondary | ICD-10-CM

## 2015-09-16 DIAGNOSIS — D509 Iron deficiency anemia, unspecified: Secondary | ICD-10-CM

## 2015-09-16 DIAGNOSIS — N184 Chronic kidney disease, stage 4 (severe): Secondary | ICD-10-CM

## 2015-09-16 LAB — CBC WITH DIFFERENTIAL/PLATELET
BASO%: 0.4 % (ref 0.0–2.0)
BASOS ABS: 0 10*3/uL (ref 0.0–0.1)
EOS%: 4.3 % (ref 0.0–7.0)
Eosinophils Absolute: 0.2 10*3/uL (ref 0.0–0.5)
HCT: 39.8 % (ref 34.8–46.6)
HEMOGLOBIN: 10.8 g/dL — AB (ref 11.6–15.9)
LYMPH#: 1.1 10*3/uL (ref 0.9–3.3)
LYMPH%: 23.9 % (ref 14.0–49.7)
MCH: 34 pg (ref 25.1–34.0)
MCHC: 27.1 g/dL — AB (ref 31.5–36.0)
MONO#: 0.7 10*3/uL (ref 0.1–0.9)
MONO%: 15.9 % — AB (ref 0.0–14.0)
NEUT#: 2.6 10*3/uL (ref 1.5–6.5)
NEUT%: 55.5 % (ref 38.4–76.8)
Platelets: 68 10*3/uL — ABNORMAL LOW (ref 145–400)
RBC: 3.18 10*6/uL — ABNORMAL LOW (ref 3.70–5.45)
RDW: 15.3 % — ABNORMAL HIGH (ref 11.2–14.5)
WBC: 4.6 10*3/uL (ref 3.9–10.3)
nRBC: 0 % (ref 0–0)

## 2015-09-16 MED ORDER — DARBEPOETIN ALFA 300 MCG/0.6ML IJ SOSY
300.0000 ug | PREFILLED_SYRINGE | Freq: Once | INTRAMUSCULAR | Status: AC
  Administered 2015-09-16: 300 ug via SUBCUTANEOUS
  Filled 2015-09-16: qty 0.6

## 2015-09-22 ENCOUNTER — Other Ambulatory Visit: Payer: Self-pay | Admitting: Oncology

## 2015-09-22 DIAGNOSIS — D500 Iron deficiency anemia secondary to blood loss (chronic): Secondary | ICD-10-CM

## 2015-09-29 DIAGNOSIS — Z7189 Other specified counseling: Secondary | ICD-10-CM | POA: Diagnosis not present

## 2015-10-06 DIAGNOSIS — E1122 Type 2 diabetes mellitus with diabetic chronic kidney disease: Secondary | ICD-10-CM | POA: Diagnosis not present

## 2015-10-06 DIAGNOSIS — H6121 Impacted cerumen, right ear: Secondary | ICD-10-CM | POA: Diagnosis not present

## 2015-10-06 DIAGNOSIS — H7191 Unspecified cholesteatoma, right ear: Secondary | ICD-10-CM | POA: Diagnosis not present

## 2015-10-07 ENCOUNTER — Ambulatory Visit: Payer: Medicare HMO

## 2015-10-07 ENCOUNTER — Ambulatory Visit (HOSPITAL_BASED_OUTPATIENT_CLINIC_OR_DEPARTMENT_OTHER): Payer: Medicare HMO | Admitting: Oncology

## 2015-10-07 ENCOUNTER — Telehealth: Payer: Self-pay | Admitting: Oncology

## 2015-10-07 ENCOUNTER — Other Ambulatory Visit (HOSPITAL_BASED_OUTPATIENT_CLINIC_OR_DEPARTMENT_OTHER): Payer: Medicare HMO

## 2015-10-07 VITALS — BP 126/48 | HR 62 | Temp 97.7°F | Resp 17 | Ht 68.0 in | Wt 137.6 lb

## 2015-10-07 DIAGNOSIS — N189 Chronic kidney disease, unspecified: Principal | ICD-10-CM

## 2015-10-07 DIAGNOSIS — N289 Disorder of kidney and ureter, unspecified: Secondary | ICD-10-CM | POA: Diagnosis not present

## 2015-10-07 DIAGNOSIS — N184 Chronic kidney disease, stage 4 (severe): Secondary | ICD-10-CM

## 2015-10-07 DIAGNOSIS — D649 Anemia, unspecified: Secondary | ICD-10-CM | POA: Diagnosis not present

## 2015-10-07 DIAGNOSIS — D509 Iron deficiency anemia, unspecified: Secondary | ICD-10-CM

## 2015-10-07 DIAGNOSIS — D696 Thrombocytopenia, unspecified: Secondary | ICD-10-CM | POA: Diagnosis not present

## 2015-10-07 DIAGNOSIS — D500 Iron deficiency anemia secondary to blood loss (chronic): Secondary | ICD-10-CM

## 2015-10-07 DIAGNOSIS — D631 Anemia in chronic kidney disease: Secondary | ICD-10-CM

## 2015-10-07 LAB — IRON AND TIBC
%SAT: 19 % — ABNORMAL LOW (ref 21–57)
IRON: 64 ug/dL (ref 41–142)
TIBC: 334 ug/dL (ref 236–444)
UIBC: 270 ug/dL (ref 120–384)

## 2015-10-07 LAB — CBC WITH DIFFERENTIAL/PLATELET
BASO%: 0.6 % (ref 0.0–2.0)
Basophils Absolute: 0 10*3/uL (ref 0.0–0.1)
EOS ABS: 0.2 10*3/uL (ref 0.0–0.5)
EOS%: 3.8 % (ref 0.0–7.0)
HCT: 35.4 % (ref 34.8–46.6)
HGB: 11.5 g/dL — ABNORMAL LOW (ref 11.6–15.9)
LYMPH%: 23.9 % (ref 14.0–49.7)
MCH: 32.7 pg (ref 25.1–34.0)
MCHC: 32.5 g/dL (ref 31.5–36.0)
MCV: 100.6 fL (ref 79.5–101.0)
MONO#: 0.7 10*3/uL (ref 0.1–0.9)
MONO%: 14.8 % — ABNORMAL HIGH (ref 0.0–14.0)
NEUT%: 56.9 % (ref 38.4–76.8)
NEUTROS ABS: 2.8 10*3/uL (ref 1.5–6.5)
PLATELETS: 111 10*3/uL — AB (ref 145–400)
RBC: 3.52 10*6/uL — AB (ref 3.70–5.45)
RDW: 13.9 % (ref 11.2–14.5)
WBC: 4.9 10*3/uL (ref 3.9–10.3)
lymph#: 1.2 10*3/uL (ref 0.9–3.3)

## 2015-10-07 LAB — FERRITIN: Ferritin: 40 ng/ml (ref 9–269)

## 2015-10-07 MED ORDER — DARBEPOETIN ALFA 300 MCG/0.6ML IJ SOSY: 300.0000 ug | PREFILLED_SYRINGE | Freq: Once | INTRAMUSCULAR | Status: DC

## 2015-10-07 NOTE — Telephone Encounter (Signed)
per pof to sch pt appt-gave pt copy of avs °

## 2015-10-07 NOTE — Progress Notes (Signed)
Hematology and Oncology Follow Up Visit  [name] [id number] Dec 27, 1940 75 y.o. 10/07/2015 9:03 AM [name], MDWilson, Jeanne Flavors, MD   Principle Diagnosis: This is a 75 year old female with multifactorial anemia. She has anemia of renal disease diagnosed in 2011.  Current therapy: Aranesp 300 mcg subcutaneously every 3 weeks keeping hemoglobin above 10.  Interim History:  [name] presents today for a followup visit with her husband. Since the last visit, she reports no major changes in her health. She is feeling better and her energy has improved. She has not started hemodialysis at this time and currently under evaluation for possible renal transplant.   She continues to receive Aranesp every 3 weeks to keep her hemoglobin above 10. She received Aranesp without any complications at this time. She denied any increase in her blood pressure or thrombotic events. She does not report any chest pain or difficulty breathing. Has not reported any falls or syncope. She does report unsteadiness but is unrelated to her hemoglobin.   She has not reported any headaches or blurry vision or double vision. She does not report any chest pain shortness of breath or difficulty breathing. She does not report any nausea or vomiting or abdominal pain. She does not report any genitourinary complaints. She has not reported any bleeding or clotting tendencies. She has not reported any lymphadenopathy or petechiae. Rest of her review of systems unremarkable.  Medications: I have reviewed the patient's current medications.  Current Outpatient Prescriptions  Medication Sig Dispense Refill  . ALPRAZolam (XANAX) 0.5 MG tablet Take 0.5 mg by mouth at bedtime.     [name] amLODipine (NORVASC) 5 MG tablet Take 1 tablet (5 mg total) by mouth daily. 90 tablet 3  . aspirin 81 MG tablet Take 81 mg by mouth daily.     . calcium acetate (PHOSLO) 667 MG capsule Take 1,334 mg by mouth 3 (three) times daily with meals. As  directed    . carvedilol (COREG) 12.5 MG tablet Take 1 tablet (12.5 mg total) by mouth 2 (two) times daily. 180 tablet 3  . cholecalciferol (VITAMIN D) 1000 UNITS tablet Take 1,000 Units by mouth daily.    . furosemide (LASIX) 20 MG tablet Take 20 mg by mouth daily. As directed    . losartan (COZAAR) 25 MG tablet Take 1 tablet (25 mg total) by mouth daily. 90 tablet 3  . rosuvastatin (CRESTOR) 5 MG tablet Take 1 tablet (5 mg total) by mouth daily. 90 tablet 3  . sevelamer carbonate (RENVELA) 800 MG tablet Take 800 mg by mouth.    . sodium bicarbonate 650 MG tablet Take 650 mg by mouth 2 (two) times daily.    [name] trimethoprim (TRIMPEX) 100 MG tablet Take 50 mg by mouth daily.     . VELTASSA 8.4 G packet Take 8.4 g by mouth daily after lunch.     . [DISCONTINUED] metoprolol succinate (TOPROL-XL) 50 MG 24 hr tablet Take 50 mg by mouth 2 (two) times daily. Take with or immediately following a meal.     No current facility-administered medications for this visit.     Allergies:  Allergies  Allergen Reactions  . Lipitor [Atorvastatin] Other (See Comments)    Stomach pain  . Strawberry Extract Rash    Past Medical History, Surgical history, Social history, and Family History were reviewed and updated.    Physical Exam: Blood pressure 126/48, pulse 62, temperature 97.7 F (36.5 C), temperature source Oral, resp. rate 17, height 5\' 8"  (  1.727 m), weight 137 lb 9.6 oz (62.415 kg), SpO2 100 %. ECOG: 1 General appearance: chronically ill-appearing without distress today.  Head: Normocephalic, without obvious abnormality no oral thrush or ulcers. Neck: no adenopathy and no carotid bruit Lymph nodes: Cervical, supraclavicular, and axillary nodes normal. Heart:regular rate and rhythm, S1, S2 normal, no murmur, click, rub or gallop Lung:chest clear, no wheezing, rales, normal symmetric air entry.  Abdomen: soft, non-tender, without masses or organomegaly no rebound or guarding. EXT:no erythema,  induration, or nodules   Lab Results: Lab Results  Component Value Date   WBC 4.9 10/07/2015   HGB 11.5* 10/07/2015   HCT 35.4 10/07/2015   MCV 100.6 10/07/2015   PLT 111* 10/07/2015     Chemistry      Component Value Date/Time   NA 133* 07/02/2015 0651   NA 134* 07/09/2014 0934   K 4.4 07/02/2015 0651   K 5.1 07/09/2014 0934   CL 105 12/20/2013 1103   CL 101 08/16/2012 0757   CO2 21* 07/09/2014 0934   CO2 24 12/20/2013 1103   BUN 47.9* 07/09/2014 0934   BUN 29* 12/20/2013 1103   CREATININE 2.9* 07/09/2014 0934   CREATININE 2.4* 12/20/2013 1103      Component Value Date/Time   CALCIUM 9.6 07/09/2014 0934   CALCIUM 10.1 12/20/2013 1103   ALKPHOS 69 01/20/2015 0949   ALKPHOS 100 07/09/2014 0934   AST 14 01/20/2015 0949   AST 24 07/09/2014 0934   ALT 12 01/20/2015 0949   ALT 21 07/09/2014 0934   BILITOT 0.5 01/20/2015 0949   BILITOT 0.44 07/09/2014 0934     Impression and Plan:   75 year old female with the following issues:   1. Anemia of renal disease. She is currently on Aranesp 300 g every 3 weeks to keep her hemoglobin above 10. Her iron studies have been repeated today to make sure her iron stores are adequate. Her hemoglobin is 11.5 today and will not require any injections. The plan is to schedule her every 3 weeks and recheck iron stores every 3 months.   2. 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  Her platelet count is [id number] and certainly close to normal range.  3. Follow-up: CBC and Aranesp injection every 3 weeks. Visit in  3 months.      [facility] 2/22/20179:03 AM

## 2015-10-17 DIAGNOSIS — N184 Chronic kidney disease, stage 4 (severe): Secondary | ICD-10-CM | POA: Diagnosis not present

## 2015-10-19 DIAGNOSIS — Z01419 Encounter for gynecological examination (general) (routine) without abnormal findings: Secondary | ICD-10-CM | POA: Diagnosis not present

## 2015-10-21 DIAGNOSIS — E1122 Type 2 diabetes mellitus with diabetic chronic kidney disease: Secondary | ICD-10-CM | POA: Diagnosis not present

## 2015-10-21 DIAGNOSIS — E875 Hyperkalemia: Secondary | ICD-10-CM | POA: Diagnosis not present

## 2015-10-21 DIAGNOSIS — N185 Chronic kidney disease, stage 5: Secondary | ICD-10-CM | POA: Diagnosis not present

## 2015-10-21 DIAGNOSIS — I770 Arteriovenous fistula, acquired: Secondary | ICD-10-CM | POA: Diagnosis not present

## 2015-10-21 DIAGNOSIS — E872 Acidosis: Secondary | ICD-10-CM | POA: Diagnosis not present

## 2015-10-21 DIAGNOSIS — D631 Anemia in chronic kidney disease: Secondary | ICD-10-CM | POA: Diagnosis not present

## 2015-10-21 DIAGNOSIS — I120 Hypertensive chronic kidney disease with stage 5 chronic kidney disease or end stage renal disease: Secondary | ICD-10-CM | POA: Diagnosis not present

## 2015-10-28 ENCOUNTER — Ambulatory Visit (HOSPITAL_BASED_OUTPATIENT_CLINIC_OR_DEPARTMENT_OTHER): Payer: Medicare HMO

## 2015-10-28 ENCOUNTER — Other Ambulatory Visit (HOSPITAL_BASED_OUTPATIENT_CLINIC_OR_DEPARTMENT_OTHER): Payer: Medicare HMO

## 2015-10-28 VITALS — BP 135/58 | HR 61 | Temp 97.8°F

## 2015-10-28 DIAGNOSIS — D631 Anemia in chronic kidney disease: Secondary | ICD-10-CM

## 2015-10-28 DIAGNOSIS — D509 Iron deficiency anemia, unspecified: Secondary | ICD-10-CM

## 2015-10-28 DIAGNOSIS — D696 Thrombocytopenia, unspecified: Secondary | ICD-10-CM

## 2015-10-28 DIAGNOSIS — N289 Disorder of kidney and ureter, unspecified: Secondary | ICD-10-CM | POA: Diagnosis not present

## 2015-10-28 DIAGNOSIS — D649 Anemia, unspecified: Secondary | ICD-10-CM | POA: Diagnosis not present

## 2015-10-28 DIAGNOSIS — N184 Chronic kidney disease, stage 4 (severe): Secondary | ICD-10-CM

## 2015-10-28 DIAGNOSIS — N189 Chronic kidney disease, unspecified: Principal | ICD-10-CM

## 2015-10-28 LAB — CBC WITH DIFFERENTIAL/PLATELET
BASO%: 0.5 % (ref 0.0–2.0)
Basophils Absolute: 0 10*3/uL (ref 0.0–0.1)
EOS ABS: 0.2 10*3/uL (ref 0.0–0.5)
EOS%: 3.8 % (ref 0.0–7.0)
HEMATOCRIT: 31.1 % — AB (ref 34.8–46.6)
HGB: 10.3 g/dL — ABNORMAL LOW (ref 11.6–15.9)
LYMPH%: 28.7 % (ref 14.0–49.7)
MCH: 32.4 pg (ref 25.1–34.0)
MCHC: 33.1 g/dL (ref 31.5–36.0)
MCV: 97.8 fL (ref 79.5–101.0)
MONO#: 0.6 10*3/uL (ref 0.1–0.9)
MONO%: 10 % (ref 0.0–14.0)
NEUT%: 57 % (ref 38.4–76.8)
NEUTROS ABS: 3.3 10*3/uL (ref 1.5–6.5)
PLATELETS: 90 10*3/uL — AB (ref 145–400)
RBC: 3.18 10*6/uL — ABNORMAL LOW (ref 3.70–5.45)
RDW: 13.3 % (ref 11.2–14.5)
WBC: 5.7 10*3/uL (ref 3.9–10.3)
lymph#: 1.6 10*3/uL (ref 0.9–3.3)
nRBC: 0 % (ref 0–0)

## 2015-10-28 MED ORDER — DARBEPOETIN ALFA 300 MCG/0.6ML IJ SOSY
300.0000 ug | PREFILLED_SYRINGE | Freq: Once | INTRAMUSCULAR | Status: AC
  Administered 2015-10-28: 300 ug via SUBCUTANEOUS
  Filled 2015-10-28: qty 0.6

## 2015-11-03 DIAGNOSIS — H7291 Unspecified perforation of tympanic membrane, right ear: Secondary | ICD-10-CM | POA: Diagnosis not present

## 2015-11-03 DIAGNOSIS — H6041 Cholesteatoma of right external ear: Secondary | ICD-10-CM | POA: Diagnosis not present

## 2015-11-18 ENCOUNTER — Other Ambulatory Visit (HOSPITAL_BASED_OUTPATIENT_CLINIC_OR_DEPARTMENT_OTHER): Payer: Medicare HMO

## 2015-11-18 ENCOUNTER — Ambulatory Visit (HOSPITAL_BASED_OUTPATIENT_CLINIC_OR_DEPARTMENT_OTHER): Payer: Medicare HMO

## 2015-11-18 VITALS — BP 138/53 | HR 57 | Temp 97.9°F

## 2015-11-18 DIAGNOSIS — N189 Chronic kidney disease, unspecified: Secondary | ICD-10-CM

## 2015-11-18 DIAGNOSIS — N184 Chronic kidney disease, stage 4 (severe): Secondary | ICD-10-CM

## 2015-11-18 DIAGNOSIS — D696 Thrombocytopenia, unspecified: Secondary | ICD-10-CM

## 2015-11-18 DIAGNOSIS — D631 Anemia in chronic kidney disease: Secondary | ICD-10-CM | POA: Diagnosis not present

## 2015-11-18 DIAGNOSIS — D509 Iron deficiency anemia, unspecified: Secondary | ICD-10-CM

## 2015-11-18 LAB — CBC WITH DIFFERENTIAL/PLATELET
BASO%: 0.2 % (ref 0.0–2.0)
BASOS ABS: 0 10*3/uL (ref 0.0–0.1)
EOS ABS: 0.2 10*3/uL (ref 0.0–0.5)
EOS%: 3.2 % (ref 0.0–7.0)
HCT: 30 % — ABNORMAL LOW (ref 34.8–46.6)
HGB: 9.9 g/dL — ABNORMAL LOW (ref 11.6–15.9)
LYMPH%: 25.8 % (ref 14.0–49.7)
MCH: 33.1 pg (ref 25.1–34.0)
MCHC: 33 g/dL (ref 31.5–36.0)
MCV: 100.3 fL (ref 79.5–101.0)
MONO#: 0.7 10*3/uL (ref 0.1–0.9)
MONO%: 13.7 % (ref 0.0–14.0)
NEUT#: 2.8 10*3/uL (ref 1.5–6.5)
NEUT%: 57.1 % (ref 38.4–76.8)
PLATELETS: 99 10*3/uL — AB (ref 145–400)
RBC: 2.99 10*6/uL — AB (ref 3.70–5.45)
RDW: 16.1 % — ABNORMAL HIGH (ref 11.2–14.5)
WBC: 5 10*3/uL (ref 3.9–10.3)
lymph#: 1.3 10*3/uL (ref 0.9–3.3)
nRBC: 0 % (ref 0–0)

## 2015-11-18 MED ORDER — DARBEPOETIN ALFA 300 MCG/0.6ML IJ SOSY
300.0000 ug | PREFILLED_SYRINGE | Freq: Once | INTRAMUSCULAR | Status: AC
  Administered 2015-11-18: 300 ug via SUBCUTANEOUS
  Filled 2015-11-18: qty 0.6

## 2015-11-19 DIAGNOSIS — H401232 Low-tension glaucoma, bilateral, moderate stage: Secondary | ICD-10-CM | POA: Diagnosis not present

## 2015-12-03 DIAGNOSIS — N183 Chronic kidney disease, stage 3 (moderate): Secondary | ICD-10-CM | POA: Diagnosis not present

## 2015-12-04 DIAGNOSIS — E872 Acidosis: Secondary | ICD-10-CM | POA: Diagnosis not present

## 2015-12-04 DIAGNOSIS — I120 Hypertensive chronic kidney disease with stage 5 chronic kidney disease or end stage renal disease: Secondary | ICD-10-CM | POA: Diagnosis not present

## 2015-12-04 DIAGNOSIS — N185 Chronic kidney disease, stage 5: Secondary | ICD-10-CM | POA: Diagnosis not present

## 2015-12-04 DIAGNOSIS — E1122 Type 2 diabetes mellitus with diabetic chronic kidney disease: Secondary | ICD-10-CM | POA: Diagnosis not present

## 2015-12-04 DIAGNOSIS — D631 Anemia in chronic kidney disease: Secondary | ICD-10-CM | POA: Diagnosis not present

## 2015-12-04 DIAGNOSIS — K5900 Constipation, unspecified: Secondary | ICD-10-CM | POA: Diagnosis not present

## 2015-12-04 DIAGNOSIS — E875 Hyperkalemia: Secondary | ICD-10-CM | POA: Diagnosis not present

## 2015-12-04 DIAGNOSIS — M899 Disorder of bone, unspecified: Secondary | ICD-10-CM | POA: Diagnosis not present

## 2015-12-09 ENCOUNTER — Other Ambulatory Visit (HOSPITAL_BASED_OUTPATIENT_CLINIC_OR_DEPARTMENT_OTHER): Payer: Medicare HMO

## 2015-12-09 ENCOUNTER — Ambulatory Visit: Payer: Medicare HMO

## 2015-12-09 DIAGNOSIS — D696 Thrombocytopenia, unspecified: Secondary | ICD-10-CM

## 2015-12-09 DIAGNOSIS — D631 Anemia in chronic kidney disease: Secondary | ICD-10-CM | POA: Diagnosis not present

## 2015-12-09 DIAGNOSIS — N184 Chronic kidney disease, stage 4 (severe): Secondary | ICD-10-CM

## 2015-12-09 DIAGNOSIS — D509 Iron deficiency anemia, unspecified: Secondary | ICD-10-CM

## 2015-12-09 DIAGNOSIS — N189 Chronic kidney disease, unspecified: Secondary | ICD-10-CM

## 2015-12-09 DIAGNOSIS — D500 Iron deficiency anemia secondary to blood loss (chronic): Secondary | ICD-10-CM

## 2015-12-09 LAB — CBC WITH DIFFERENTIAL/PLATELET
BASO%: 0.2 % (ref 0.0–2.0)
Basophils Absolute: 0 10*3/uL (ref 0.0–0.1)
EOS%: 3.9 % (ref 0.0–7.0)
Eosinophils Absolute: 0.2 10*3/uL (ref 0.0–0.5)
HCT: 33 % — ABNORMAL LOW (ref 34.8–46.6)
HGB: 10.8 g/dL — ABNORMAL LOW (ref 11.6–15.9)
LYMPH%: 29.6 % (ref 14.0–49.7)
MCH: 33.6 pg (ref 25.1–34.0)
MCHC: 32.7 g/dL (ref 31.5–36.0)
MCV: 102.8 fL — ABNORMAL HIGH (ref 79.5–101.0)
MONO#: 0.5 10*3/uL (ref 0.1–0.9)
MONO%: 11.6 % (ref 0.0–14.0)
NEUT%: 54.7 % (ref 38.4–76.8)
NEUTROS ABS: 2.5 10*3/uL (ref 1.5–6.5)
Platelets: 98 10*3/uL — ABNORMAL LOW (ref 145–400)
RBC: 3.21 10*6/uL — AB (ref 3.70–5.45)
RDW: 16 % — ABNORMAL HIGH (ref 11.2–14.5)
WBC: 4.6 10*3/uL (ref 3.9–10.3)
lymph#: 1.4 10*3/uL (ref 0.9–3.3)

## 2015-12-09 LAB — COMPREHENSIVE METABOLIC PANEL
ALK PHOS: 77 U/L (ref 40–150)
ALT: 15 U/L (ref 0–55)
ANION GAP: 11 meq/L (ref 3–11)
AST: 20 U/L (ref 5–34)
Albumin: 3.7 g/dL (ref 3.5–5.0)
BILIRUBIN TOTAL: 0.41 mg/dL (ref 0.20–1.20)
BUN: 70.2 mg/dL — ABNORMAL HIGH (ref 7.0–26.0)
CO2: 17 mEq/L — ABNORMAL LOW (ref 22–29)
Calcium: 9.1 mg/dL (ref 8.4–10.4)
Chloride: 102 mEq/L (ref 98–109)
Creatinine: 4.1 mg/dL (ref 0.6–1.1)
EGFR: 10 mL/min/{1.73_m2} — AB (ref >90)
Glucose: 123 mg/dl (ref 70–140)
POTASSIUM: 4.6 meq/L (ref 3.5–5.1)
SODIUM: 130 meq/L — AB (ref 136–145)
TOTAL PROTEIN: 6.7 g/dL (ref 6.4–8.3)

## 2015-12-09 LAB — IRON AND TIBC
%SAT: 23 % (ref 21–57)
IRON: 78 ug/dL (ref 41–142)
TIBC: 334 ug/dL (ref 236–444)
UIBC: 256 ug/dL (ref 120–384)

## 2015-12-09 LAB — FERRITIN: Ferritin: 35 ng/ml (ref 9–269)

## 2015-12-09 MED ORDER — DARBEPOETIN ALFA 300 MCG/0.6ML IJ SOSY: 300.0000 ug | PREFILLED_SYRINGE | Freq: Once | INTRAMUSCULAR | Status: DC

## 2015-12-30 ENCOUNTER — Other Ambulatory Visit: Payer: Medicare HMO

## 2016-01-06 ENCOUNTER — Ambulatory Visit (HOSPITAL_BASED_OUTPATIENT_CLINIC_OR_DEPARTMENT_OTHER): Payer: Medicare HMO | Admitting: Oncology

## 2016-01-06 ENCOUNTER — Ambulatory Visit (HOSPITAL_BASED_OUTPATIENT_CLINIC_OR_DEPARTMENT_OTHER): Payer: Medicare HMO

## 2016-01-06 ENCOUNTER — Telehealth: Payer: Self-pay | Admitting: Oncology

## 2016-01-06 ENCOUNTER — Other Ambulatory Visit (HOSPITAL_BASED_OUTPATIENT_CLINIC_OR_DEPARTMENT_OTHER): Payer: Medicare HMO

## 2016-01-06 VITALS — BP 130/61 | HR 56 | Temp 97.6°F | Resp 18 | Ht 68.0 in | Wt 135.7 lb

## 2016-01-06 DIAGNOSIS — N189 Chronic kidney disease, unspecified: Secondary | ICD-10-CM | POA: Diagnosis not present

## 2016-01-06 DIAGNOSIS — D509 Iron deficiency anemia, unspecified: Secondary | ICD-10-CM

## 2016-01-06 DIAGNOSIS — D500 Iron deficiency anemia secondary to blood loss (chronic): Secondary | ICD-10-CM

## 2016-01-06 DIAGNOSIS — D696 Thrombocytopenia, unspecified: Secondary | ICD-10-CM | POA: Diagnosis not present

## 2016-01-06 DIAGNOSIS — N184 Chronic kidney disease, stage 4 (severe): Secondary | ICD-10-CM

## 2016-01-06 DIAGNOSIS — D631 Anemia in chronic kidney disease: Secondary | ICD-10-CM

## 2016-01-06 LAB — CBC WITH DIFFERENTIAL/PLATELET
BASO%: 0.4 % (ref 0.0–2.0)
BASOS ABS: 0 10*3/uL (ref 0.0–0.1)
EOS ABS: 0.2 10*3/uL (ref 0.0–0.5)
EOS%: 3.5 % (ref 0.0–7.0)
HCT: 27.9 % — ABNORMAL LOW (ref 34.8–46.6)
HGB: 9.4 g/dL — ABNORMAL LOW (ref 11.6–15.9)
LYMPH%: 31.1 % (ref 14.0–49.7)
MCH: 33.6 pg (ref 25.1–34.0)
MCHC: 33.7 g/dL (ref 31.5–36.0)
MCV: 99.6 fL (ref 79.5–101.0)
MONO#: 0.6 10*3/uL (ref 0.1–0.9)
MONO%: 13 % (ref 0.0–14.0)
NEUT#: 2.5 10*3/uL (ref 1.5–6.5)
NEUT%: 52 % (ref 38.4–76.8)
NRBC: 0 % (ref 0–0)
PLATELETS: 101 10*3/uL — AB (ref 145–400)
RBC: 2.8 10*6/uL — AB (ref 3.70–5.45)
RDW: 13.9 % (ref 11.2–14.5)
WBC: 4.9 10*3/uL (ref 3.9–10.3)
lymph#: 1.5 10*3/uL (ref 0.9–3.3)

## 2016-01-06 MED ORDER — DARBEPOETIN ALFA 300 MCG/0.6ML IJ SOSY
300.0000 ug | PREFILLED_SYRINGE | Freq: Once | INTRAMUSCULAR | Status: AC
  Administered 2016-01-06: 300 ug via SUBCUTANEOUS
  Filled 2016-01-06: qty 0.6

## 2016-01-06 NOTE — Patient Instructions (Signed)
Darbepoetin Alfa injection What is this medicine? DARBEPOETIN ALFA (dar be POE e tin AL fa) helps your body make more red blood cells. It is used to treat anemia caused by chronic kidney failure and chemotherapy. This medicine may be used for other purposes; ask your health care provider or pharmacist if you have questions. COMMON BRAND NAME(S): Aranesp What should I tell my health care provider before I take this medicine? They need to know if you have any of these conditions: -blood clotting disorders or history of blood clots -cancer patient not on chemotherapy -cystic fibrosis -heart disease, such as angina, heart failure, or a history of a heart attack -hemoglobin level of 12 g/dL or greater -high blood pressure -low levels of folate, iron, or vitamin B12 -seizures -an unusual or allergic reaction to darbepoetin, erythropoietin, albumin, hamster proteins, latex, other medicines, foods, dyes, or preservatives -pregnant or trying to get pregnant -breast-feeding How should I use this medicine? This medicine is for injection into a vein or under the skin. It is usually given by a health care professional in a hospital or clinic setting. If you get this medicine at home, you will be taught how to prepare and give this medicine. Do not shake the solution before you withdraw a dose. Use exactly as directed. Take your medicine at regular intervals. Do not take your medicine more often than directed. It is important that you put your used needles and syringes in a special sharps container. Do not put them in a trash can. If you do not have a sharps container, call your pharmacist or healthcare provider to get one. Talk to your pediatrician regarding the use of this medicine in children. While this medicine may be used in children as young as 1 year for selected conditions, precautions do apply. Overdosage: If you think you have taken too much of this medicine contact a poison control center or  emergency room at once. NOTE: This medicine is only for you. Do not share this medicine with others. What if I miss a dose? If you miss a dose, take it as soon as you can. If it is almost time for your next dose, take only that dose. Do not take double or extra doses. What may interact with this medicine? Do not take this medicine with any of the following medications: -epoetin alfa This list may not describe all possible interactions. Give your health care provider a list of all the medicines, herbs, non-prescription drugs, or dietary supplements you use. Also tell them if you smoke, drink alcohol, or use illegal drugs. Some items may interact with your medicine. What should I watch for while using this medicine? Visit your prescriber or health care professional for regular checks on your progress and for the needed blood tests and blood pressure measurements. It is especially important for the doctor to make sure your hemoglobin level is in the desired range, to limit the risk of potential side effects and to give you the best benefit. Keep all appointments for any recommended tests. Check your blood pressure as directed. Ask your doctor what your blood pressure should be and when you should contact him or her. As your body makes more red blood cells, you may need to take iron, folic acid, or vitamin B supplements. Ask your doctor or health care provider which products are right for you. If you have kidney disease continue dietary restrictions, even though this medication can make you feel better. Talk with your doctor or health   care professional about the foods you eat and the vitamins that you take. What side effects may I notice from receiving this medicine? Side effects that you should report to your doctor or health care professional as soon as possible: -allergic reactions like skin rash, itching or hives, swelling of the face, lips, or tongue -breathing problems -changes in vision -chest  pain -confusion, trouble speaking or understanding -feeling faint or lightheaded, falls -high blood pressure -muscle aches or pains -pain, swelling, warmth in the leg -rapid weight gain -severe headaches -sudden numbness or weakness of the face, arm or leg -trouble walking, dizziness, loss of balance or coordination -seizures (convulsions) -swelling of the ankles, feet, hands -unusually weak or tired Side effects that usually do not require medical attention (report to your doctor or health care professional if they continue or are bothersome): -diarrhea -fever, chills (flu-like symptoms) -headaches -nausea, vomiting -redness, stinging, or swelling at site where injected This list may not describe all possible side effects. Call your doctor for medical advice about side effects. You may report side effects to FDA at 1-800-FDA-1088. Where should I keep my medicine? Keep out of the reach of children. Store in a refrigerator between 2 and 8 degrees C (36 and 46 degrees F). Do not freeze. Do not shake. Throw away any unused portion if using a single-dose vial. Throw away any unused medicine after the expiration date. NOTE: This sheet is a summary. It may not cover all possible information. If you have questions about this medicine, talk to your doctor, pharmacist, or health care provider.  2015, [employer]. (2008-07-15 10:23:57)  

## 2016-01-06 NOTE — Telephone Encounter (Signed)
per of to sch pt appt-gave pt copy of avs °

## 2016-01-06 NOTE — Progress Notes (Signed)
Hematology and Oncology Follow Up Visit  LAKEYA HARPENAU [id number] September 13, 1940 75 y.o. 01/06/2016 8:22 AM [name], MDWilson, [name], MD   Principle Diagnosis: 75 year old female with anemia of renal disease diagnosed in 2011.  Current therapy: Aranesp 300 mcg subcutaneously every 3 weeks keeping hemoglobin above 10.  Interim History:  Mrs. Bille presents today for a followup visit with her husband. Since the last visit, she she continues to feel reasonably fair. She does not report any major changes in her health. reports no major changes in her health. She continues to receive Aranesp every 3 weeks to keep her hemoglobin above 10. She received Aranesp without any complications at this time. She tolerates his injections well and feels reasonably well as well after each injection especially when her hemoglobin is above 10. She denied any increase in her blood pressure or thrombotic events. She does not report any chest pain or difficulty breathing. Has not reported any falls or syncope. She continues to have reasonable quality of life without any further decline.  She has not reported any headaches or blurry vision or double vision. She does not report any chest pain shortness of breath or difficulty breathing. She does not report any nausea or vomiting or abdominal pain. She does not report any genitourinary complaints. She has not reported any bleeding or clotting tendencies. She has not reported any lymphadenopathy or petechiae. Rest of her review of systems unremarkable.  Medications: I have reviewed the patient's current medications.  Current Outpatient Prescriptions  Medication Sig Dispense Refill  . ALPRAZolam (XANAX) 0.5 MG tablet Take 0.5 mg by mouth at bedtime.     [name] amLODipine (NORVASC) 5 MG tablet Take 1 tablet (5 mg total) by mouth daily. 90 tablet 3  . aspirin 81 MG tablet Take 81 mg by mouth daily.     . calcium acetate (PHOSLO) 667 MG capsule Take 1,334 mg by mouth 3 (three)  times daily with meals. As directed    . carvedilol (COREG) 12.5 MG tablet Take 1 tablet (12.5 mg total) by mouth 2 (two) times daily. 180 tablet 3  . cholecalciferol (VITAMIN D) 1000 UNITS tablet Take 1,000 Units by mouth daily.    . furosemide (LASIX) 20 MG tablet Take 20 mg by mouth daily. As directed    . losartan (COZAAR) 25 MG tablet Take 1 tablet (25 mg total) by mouth daily. 90 tablet 3  . rosuvastatin (CRESTOR) 5 MG tablet Take 1 tablet (5 mg total) by mouth daily. 90 tablet 3  . sevelamer carbonate (RENVELA) 800 MG tablet Take 800 mg by mouth.    . sodium bicarbonate 650 MG tablet Take 650 mg by mouth 2 (two) times daily.    [name] trimethoprim (TRIMPEX) 100 MG tablet Take 50 mg by mouth daily.     . VELTASSA 8.4 G packet Take 8.4 g by mouth daily after lunch.     . [DISCONTINUED] metoprolol succinate (TOPROL-XL) 50 MG 24 hr tablet Take 50 mg by mouth 2 (two) times daily. Take with or immediately following a meal.     No current facility-administered medications for this visit.     Allergies:  Allergies  Allergen Reactions  . Lipitor [Atorvastatin] Other (See Comments)    Stomach pain  . Strawberry Extract Rash    Past Medical History, Surgical history, Social history, and Family History were reviewed and updated.    Physical Exam: Blood pressure 130/61, pulse 56, temperature 97.6 F (36.4 C), temperature source Oral, resp. rate  18, height 5\' 8"  (1.727 m), weight 135 lb 11.2 oz (61.553 kg), SpO2 99 %. ECOG: 1 General appearance: Alert, awake woman chronically ill-appearing without distress. Head: Normocephalic, without obvious abnormality no oral ulcers or lesions. Neck: no adenopathy and no carotid bruit Lymph nodes: Cervical, supraclavicular, and axillary nodes normal. Heart:regular rate and rhythm, S1, S2 normal, no murmur, click, rub or gallop Lung:chest clear, no wheezing, rales, normal symmetric air entry.  Abdomen: soft, non-tender, without masses or organomegaly no  shifting dullness or ascites. EXT:no erythema, induration, or nodules   Lab Results: Lab Results  Component Value Date   WBC 4.9 01/06/2016   HGB 9.4* 01/06/2016   HCT 27.9* 01/06/2016   MCV 99.6 01/06/2016   PLT 101* 01/06/2016     Chemistry      Component Value Date/Time   NA 130* 12/09/2015 0747   NA 133* 07/02/2015 0651   K 4.6 12/09/2015 0747   K 4.4 07/02/2015 0651   CL 105 12/20/2013 1103   CL 101 08/16/2012 0757   CO2 17* 12/09/2015 0747   CO2 24 12/20/2013 1103   BUN 70.2* 12/09/2015 0747   BUN 29* 12/20/2013 1103   CREATININE 4.1* 12/09/2015 0747   CREATININE 2.4* 12/20/2013 1103      Component Value Date/Time   CALCIUM 9.1 12/09/2015 0747   CALCIUM 10.1 12/20/2013 1103   ALKPHOS 77 12/09/2015 0747   ALKPHOS 69 01/20/2015 0949   AST 20 12/09/2015 0747   AST 14 01/20/2015 0949   ALT 15 12/09/2015 0747   ALT 12 01/20/2015 0949   BILITOT 0.41 12/09/2015 0747   BILITOT 0.5 01/20/2015 0949     Impression and Plan:   75 year old female with the following issues:   1. Anemia of renal disease. She is currently on Aranesp 300 g every 3 weeks to keep her hemoglobin above 10. Her iron studies on 12/09/2015 indicate adequate iron stores. The plan is to keep her on Aranesp every 3 weeks and recheck iron stores every 3 months.   2. 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  Her platelet count remains stable without bleeding episodes.  3. Renal failure: Her creatinine clearance is close to 10 mL/m although not receive dialysis.  4. Follow-up: CBC and Aranesp injection every 3 weeks. Visit in September 2017.      [facility] 5/24/20178:22 AM

## 2016-01-13 DIAGNOSIS — N185 Chronic kidney disease, stage 5: Secondary | ICD-10-CM | POA: Diagnosis not present

## 2016-01-15 DIAGNOSIS — N185 Chronic kidney disease, stage 5: Secondary | ICD-10-CM | POA: Diagnosis not present

## 2016-01-15 DIAGNOSIS — E871 Hypo-osmolality and hyponatremia: Secondary | ICD-10-CM | POA: Diagnosis not present

## 2016-01-15 DIAGNOSIS — E872 Acidosis: Secondary | ICD-10-CM | POA: Diagnosis not present

## 2016-01-15 DIAGNOSIS — E875 Hyperkalemia: Secondary | ICD-10-CM | POA: Diagnosis not present

## 2016-01-15 DIAGNOSIS — I120 Hypertensive chronic kidney disease with stage 5 chronic kidney disease or end stage renal disease: Secondary | ICD-10-CM | POA: Diagnosis not present

## 2016-01-27 ENCOUNTER — Other Ambulatory Visit: Payer: Self-pay | Admitting: Nurse Practitioner

## 2016-01-27 ENCOUNTER — Other Ambulatory Visit (HOSPITAL_BASED_OUTPATIENT_CLINIC_OR_DEPARTMENT_OTHER): Payer: Medicare HMO

## 2016-01-27 ENCOUNTER — Ambulatory Visit: Payer: Medicare HMO

## 2016-01-27 DIAGNOSIS — N189 Chronic kidney disease, unspecified: Secondary | ICD-10-CM | POA: Diagnosis not present

## 2016-01-27 DIAGNOSIS — D631 Anemia in chronic kidney disease: Secondary | ICD-10-CM

## 2016-01-27 LAB — CBC WITH DIFFERENTIAL/PLATELET
BASO%: 0.4 % (ref 0.0–2.0)
BASOS ABS: 0 10*3/uL (ref 0.0–0.1)
EOS ABS: 0.2 10*3/uL (ref 0.0–0.5)
EOS%: 4.3 % (ref 0.0–7.0)
HCT: 32.2 % — ABNORMAL LOW (ref 34.8–46.6)
HGB: 10.6 g/dL — ABNORMAL LOW (ref 11.6–15.9)
LYMPH%: 30.2 % (ref 14.0–49.7)
MCH: 33.8 pg (ref 25.1–34.0)
MCHC: 32.9 g/dL (ref 31.5–36.0)
MCV: 102.5 fL — AB (ref 79.5–101.0)
MONO#: 0.7 10*3/uL (ref 0.1–0.9)
MONO%: 14.9 % — AB (ref 0.0–14.0)
NEUT#: 2.5 10*3/uL (ref 1.5–6.5)
NEUT%: 50.2 % (ref 38.4–76.8)
NRBC: 0 % (ref 0–0)
PLATELETS: 97 10*3/uL — AB (ref 145–400)
RBC: 3.14 10*6/uL — AB (ref 3.70–5.45)
RDW: 14.8 % — AB (ref 11.2–14.5)
WBC: 4.9 10*3/uL (ref 3.9–10.3)
lymph#: 1.5 10*3/uL (ref 0.9–3.3)

## 2016-01-27 NOTE — Progress Notes (Signed)
Hgb today is 10.6, injection not needed. Pt and family member given copy of current schedule and lab results. Pt instructed to keep current schedule and to call if issues occur,

## 2016-01-30 ENCOUNTER — Other Ambulatory Visit: Payer: Self-pay | Admitting: Nurse Practitioner

## 2016-02-09 ENCOUNTER — Encounter: Payer: Self-pay | Admitting: Nurse Practitioner

## 2016-02-12 DIAGNOSIS — N185 Chronic kidney disease, stage 5: Secondary | ICD-10-CM | POA: Diagnosis not present

## 2016-02-12 DIAGNOSIS — N189 Chronic kidney disease, unspecified: Secondary | ICD-10-CM | POA: Diagnosis not present

## 2016-02-12 DIAGNOSIS — M899 Disorder of bone, unspecified: Secondary | ICD-10-CM | POA: Diagnosis not present

## 2016-02-17 ENCOUNTER — Ambulatory Visit (HOSPITAL_BASED_OUTPATIENT_CLINIC_OR_DEPARTMENT_OTHER): Payer: Medicare HMO

## 2016-02-17 ENCOUNTER — Other Ambulatory Visit (HOSPITAL_BASED_OUTPATIENT_CLINIC_OR_DEPARTMENT_OTHER): Payer: Medicare HMO

## 2016-02-17 VITALS — BP 132/51 | HR 57 | Temp 97.5°F | Resp 20

## 2016-02-17 DIAGNOSIS — D509 Iron deficiency anemia, unspecified: Secondary | ICD-10-CM

## 2016-02-17 DIAGNOSIS — N189 Chronic kidney disease, unspecified: Secondary | ICD-10-CM

## 2016-02-17 DIAGNOSIS — N184 Chronic kidney disease, stage 4 (severe): Secondary | ICD-10-CM

## 2016-02-17 DIAGNOSIS — D631 Anemia in chronic kidney disease: Secondary | ICD-10-CM | POA: Diagnosis not present

## 2016-02-17 DIAGNOSIS — D696 Thrombocytopenia, unspecified: Secondary | ICD-10-CM

## 2016-02-17 LAB — CBC WITH DIFFERENTIAL/PLATELET
BASO%: 0.4 % (ref 0.0–2.0)
Basophils Absolute: 0 10*3/uL (ref 0.0–0.1)
EOS%: 3 % (ref 0.0–7.0)
Eosinophils Absolute: 0.2 10*3/uL (ref 0.0–0.5)
HEMATOCRIT: 28.5 % — AB (ref 34.8–46.6)
HEMOGLOBIN: 9.7 g/dL — AB (ref 11.6–15.9)
LYMPH#: 1.5 10*3/uL (ref 0.9–3.3)
LYMPH%: 28.2 % (ref 14.0–49.7)
MCH: 34 pg (ref 25.1–34.0)
MCHC: 34 g/dL (ref 31.5–36.0)
MCV: 100 fL (ref 79.5–101.0)
MONO#: 0.6 10*3/uL (ref 0.1–0.9)
MONO%: 11.6 % (ref 0.0–14.0)
NEUT%: 56.8 % (ref 38.4–76.8)
NEUTROS ABS: 3 10*3/uL (ref 1.5–6.5)
Platelets: 86 10*3/uL — ABNORMAL LOW (ref 145–400)
RBC: 2.85 10*6/uL — ABNORMAL LOW (ref 3.70–5.45)
RDW: 13.1 % (ref 11.2–14.5)
WBC: 5.3 10*3/uL (ref 3.9–10.3)
nRBC: 0 % (ref 0–0)

## 2016-02-17 MED ORDER — DARBEPOETIN ALFA 300 MCG/0.6ML IJ SOSY
300.0000 ug | PREFILLED_SYRINGE | Freq: Once | INTRAMUSCULAR | Status: AC
  Administered 2016-02-17: 300 ug via SUBCUTANEOUS
  Filled 2016-02-17: qty 0.6

## 2016-02-17 NOTE — Patient Instructions (Signed)
Darbepoetin Alfa injection What is this medicine? DARBEPOETIN ALFA (dar be POE e tin AL fa) helps your body make more red blood cells. It is used to treat anemia caused by chronic kidney failure and chemotherapy. This medicine may be used for other purposes; ask your health care provider or pharmacist if you have questions. COMMON BRAND NAME(S): Aranesp What should I tell my health care provider before I take this medicine? They need to know if you have any of these conditions: -blood clotting disorders or history of blood clots -cancer patient not on chemotherapy -cystic fibrosis -heart disease, such as angina, heart failure, or a history of a heart attack -hemoglobin level of 12 g/dL or greater -high blood pressure -low levels of folate, iron, or vitamin B12 -seizures -an unusual or allergic reaction to darbepoetin, erythropoietin, albumin, hamster proteins, latex, other medicines, foods, dyes, or preservatives -pregnant or trying to get pregnant -breast-feeding How should I use this medicine? This medicine is for injection into a vein or under the skin. It is usually given by a health care professional in a hospital or clinic setting. If you get this medicine at home, you will be taught how to prepare and give this medicine. Do not shake the solution before you withdraw a dose. Use exactly as directed. Take your medicine at regular intervals. Do not take your medicine more often than directed. It is important that you put your used needles and syringes in a special sharps container. Do not put them in a trash can. If you do not have a sharps container, call your pharmacist or healthcare provider to get one. Talk to your pediatrician regarding the use of this medicine in children. While this medicine may be used in children as young as 1 year for selected conditions, precautions do apply. Overdosage: If you think you have taken too much of this medicine contact a poison control center or  emergency room at once. NOTE: This medicine is only for you. Do not share this medicine with others. What if I miss a dose? If you miss a dose, take it as soon as you can. If it is almost time for your next dose, take only that dose. Do not take double or extra doses. What may interact with this medicine? Do not take this medicine with any of the following medications: -epoetin alfa This list may not describe all possible interactions. Give your health care provider a list of all the medicines, herbs, non-prescription drugs, or dietary supplements you use. Also tell them if you smoke, drink alcohol, or use illegal drugs. Some items may interact with your medicine. What should I watch for while using this medicine? Visit your prescriber or health care professional for regular checks on your progress and for the needed blood tests and blood pressure measurements. It is especially important for the doctor to make sure your hemoglobin level is in the desired range, to limit the risk of potential side effects and to give you the best benefit. Keep all appointments for any recommended tests. Check your blood pressure as directed. Ask your doctor what your blood pressure should be and when you should contact him or her. As your body makes more red blood cells, you may need to take iron, folic acid, or vitamin B supplements. Ask your doctor or health care provider which products are right for you. If you have kidney disease continue dietary restrictions, even though this medication can make you feel better. Talk with your doctor or health   care professional about the foods you eat and the vitamins that you take. What side effects may I notice from receiving this medicine? Side effects that you should report to your doctor or health care professional as soon as possible: -allergic reactions like skin rash, itching or hives, swelling of the face, lips, or tongue -breathing problems -changes in vision -chest  pain -confusion, trouble speaking or understanding -feeling faint or lightheaded, falls -high blood pressure -muscle aches or pains -pain, swelling, warmth in the leg -rapid weight gain -severe headaches -sudden numbness or weakness of the face, arm or leg -trouble walking, dizziness, loss of balance or coordination -seizures (convulsions) -swelling of the ankles, feet, hands -unusually weak or tired Side effects that usually do not require medical attention (report to your doctor or health care professional if they continue or are bothersome): -diarrhea -fever, chills (flu-like symptoms) -headaches -nausea, vomiting -redness, stinging, or swelling at site where injected This list may not describe all possible side effects. Call your doctor for medical advice about side effects. You may report side effects to FDA at 1-800-FDA-1088. Where should I keep my medicine? Keep out of the reach of children. Store in a refrigerator between 2 and 8 degrees C (36 and 46 degrees F). Do not freeze. Do not shake. Throw away any unused portion if using a single-dose vial. Throw away any unused medicine after the expiration date. NOTE: This sheet is a summary. It may not cover all possible information. If you have questions about this medicine, talk to your doctor, pharmacist, or health care provider.  2015, [employer]. (2008-07-15 10:23:57)  

## 2016-02-18 DIAGNOSIS — N185 Chronic kidney disease, stage 5: Secondary | ICD-10-CM | POA: Diagnosis not present

## 2016-02-18 DIAGNOSIS — E1122 Type 2 diabetes mellitus with diabetic chronic kidney disease: Secondary | ICD-10-CM | POA: Diagnosis not present

## 2016-02-18 DIAGNOSIS — E871 Hypo-osmolality and hyponatremia: Secondary | ICD-10-CM | POA: Diagnosis not present

## 2016-02-18 DIAGNOSIS — N189 Chronic kidney disease, unspecified: Secondary | ICD-10-CM | POA: Diagnosis not present

## 2016-02-18 DIAGNOSIS — E872 Acidosis: Secondary | ICD-10-CM | POA: Diagnosis not present

## 2016-02-18 DIAGNOSIS — I120 Hypertensive chronic kidney disease with stage 5 chronic kidney disease or end stage renal disease: Secondary | ICD-10-CM | POA: Diagnosis not present

## 2016-02-18 DIAGNOSIS — M899 Disorder of bone, unspecified: Secondary | ICD-10-CM | POA: Diagnosis not present

## 2016-02-23 ENCOUNTER — Encounter: Payer: Self-pay | Admitting: Nurse Practitioner

## 2016-02-23 ENCOUNTER — Ambulatory Visit (INDEPENDENT_AMBULATORY_CARE_PROVIDER_SITE_OTHER): Payer: Medicare HMO | Admitting: Nurse Practitioner

## 2016-02-23 VITALS — BP 148/60 | HR 58 | Ht 68.0 in | Wt 132.0 lb

## 2016-02-23 DIAGNOSIS — E785 Hyperlipidemia, unspecified: Secondary | ICD-10-CM | POA: Diagnosis not present

## 2016-02-23 DIAGNOSIS — I259 Chronic ischemic heart disease, unspecified: Secondary | ICD-10-CM | POA: Diagnosis not present

## 2016-02-23 DIAGNOSIS — I350 Nonrheumatic aortic (valve) stenosis: Secondary | ICD-10-CM

## 2016-02-23 DIAGNOSIS — I10 Essential (primary) hypertension: Secondary | ICD-10-CM | POA: Diagnosis not present

## 2016-02-23 DIAGNOSIS — R9431 Abnormal electrocardiogram [ECG] [EKG]: Secondary | ICD-10-CM | POA: Diagnosis not present

## 2016-02-23 LAB — LIPID PANEL
Cholesterol: 123 mg/dL — ABNORMAL LOW (ref 125–200)
HDL: 75 mg/dL (ref >=46)
LDL Cholesterol: 36 mg/dL (ref <130)
Total CHOL/HDL Ratio: 1.6 Ratio (ref <=5.0)
Triglycerides: 62 mg/dL (ref <150)
VLDL: 12 mg/dL (ref <30)

## 2016-02-23 NOTE — Progress Notes (Addendum)
CARDIOLOGY OFFICE NOTE  Date:  02/23/2016    [name] Date of Birth: 25-Aug-1940 Medical Record [id number]  PCP:  [name], MD  Cardiologist:  Annabell Howells    Chief Complaint  Patient presents with  . Coronary Artery Disease  . Aortic Stenosis  . Hyperlipidemia  . Hypertension    1 year check - seen for Dr. Aundra Dubin    History of Present Illness: [name] is a 75 y.o. female who presents today for a one year check. Seen for Dr. Aundra Dubin.   She has a h/o CAD s/p CABG in [id number] and diastolic CHF. She has chronic anemia, balance issues, HTN, DM, HLD, CKD, chronic back pain, anxiety and known valvular heart disease.   Last seen here a little over a year ago - balance still an issue. She is followed by Nephrology in [place] - has seen Dr. Kellie Simmering for AV fistula consult. Kidneys were failing and she was nearing dialysis.   Comes back today. Here with her husband. She is unsteady and holding on to him. Just took her medicines before coming here this morning. BP up some.    Past Medical History:  1. CAD: Pt presented 2/10 to [facility] with NSTEMI and diastolic CHF exacerbation. LHC was done 3/10 showing 99% pRCA stenosis and 80% calcified pLAD stenosis with L=>R collaterals. Pt was referred for CABG which was done by Dr. Prescott Gum with LIMA-LAD, SVG-RCA, SVG-OM.  2. Diastolic CHF: Echo ([id number]) showed EF 55-65%, mild LVH, diastolic dysfunction, mild AS with mean gradient 12 mmHg, PASP 43 mmHg. Echo (2/12): EF 55-60%, mild LVH, mild AS (mean gradient 12), PA systolic pressure 32 mmHg. Echo (5/15) with EF 55%, mild MR, mild aortic stenosis.  3. CKD 4. DM2  5. HTN  6. Hyperlipidemia: nausea/GI discomfort with atorvastatin, ? Balance problems with pravastatin.  7. Anxiety  8. Chronic low back pain  9. History of thrombocytopenia  10. 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 Followed by hematology, getting Aranesp injections 11. Aortic stenosis: mean  gradient 12 mmHg in 2/12. Mean gradient 18 mmHg with AVA 1.6 cm^2 in 5/15.  12. Carotid stenosis: 40-59% bilateral ICA stenosis in [id number]. [id number] LICA stenosis in [id number]. Carotids (0000000) with 123456 LICA stenosis.  13. PAD: ABIs 3/12 were normal but TBIs were mildly decreased. ABIs/TBIs (10/13) normal.    Past Medical History  Diagnosis Date  . Hypertension   . Hyperlipidemia   . Diabetes mellitus   . Coronary artery disease     Pt presented 2/10 to [facility] with NSTEMI and diastolic CHF exacerbation.  LHC was done  3/10 showing 99% pRCA stenosis and 80% calcified pLAD stenosis with L=>R collaterals.  Pt was referred  for CABG which was done by Dr. Prescott Gum with LIMA-LAD, SVG-RCA, SVG-OM.  [name] Anxiety   . Chronic low back pain   . Anemia     Pt is taking iron.   . Mild aortic stenosis     mean gradient 12 mmHg in 2/12.  . Thrombocytopenia ([place])   . Carotid stenosis     40-59% bilateral ICA stenosis in 2/12.  . Diastolic CHF ([place])     Echo (2/10) showed EF 55-65%, mild LVH, diastolic dysfunction, mild AS with mean gradient 12 mmHg, PASP 43 mmHg.  Echo (2/12): EF 55-60%, mild LVH, mild AS (mean gradient 12), PA systolic pressure 32 mmHg.     [name] PONV (postoperative nausea and vomiting)   .  Myocardial infarction ([place])     "mild"  . Heart murmur   . Pneumonia   . GERD (gastroesophageal reflux disease)   . Diabetic neuropathy ([place])   . Unsteady gait   . Arthritis   . CKD (chronic kidney disease)     Dr. Audie Clear at [facility]    Past Surgical History  Procedure Laterality Date  . Coronary artery bypass graft  09/2008    pt with NSTEMI and diastolic CHF exacerbation.  LHC was done  3/10 showing 99% pRCA stenosis and 80% calcified pLAD stenosis with L=>R collaterals.  Pt was referred  for CABG which was done by Dr. Prescott Gum with LIMA-LAD, SVG-RCA, SVG-OM.  [name] Back surgery      multiple  . Cardiac catheterization    . Cataract extraction w/ intraocular lens  implant,  bilateral    . Colonoscopy w/ biopsies and polypectomy    . Breast surgery      biopsy  . Av fistula placement Left 01/30/2015    Procedure: Creation of a Radial Cephalic AV Fistula left wrist;  Surgeon: [name], MD;  Location: [place];  Service: Vascular;  Laterality: Left;  . Revison of arteriovenous fistula Left 07/02/2015    Procedure: REVISON OF LEFT RADIOCEPHALIC ARTERIOVENOUS FISTULA;  Surgeon: [name], MD;  Location: [place];  Service: Vascular;  Laterality: Left;     Medications: Current Outpatient Prescriptions  Medication Sig Dispense Refill  . ALPRAZolam (XANAX) 0.5 MG tablet Take 0.5 mg by mouth at bedtime.     [name] amLODipine (NORVASC) 5 MG tablet Take 1 tablet (5 mg total) by mouth daily. 90 tablet 3  . aspirin 81 MG tablet Take 81 mg by mouth daily.     . calcium acetate (PHOSLO) 667 MG capsule Take 1,334 mg by mouth 3 (three) times daily with meals. As directed    . carvedilol (COREG) 12.5 MG tablet TAKE 1 TABLET(12.5 MG) BY MOUTH TWICE DAILY 60 tablet 0  . cholecalciferol (VITAMIN D) 1000 UNITS tablet Take 1,000 Units by mouth daily.    . furosemide (LASIX) 20 MG tablet Take 20 mg by mouth daily. As directed    . losartan (COZAAR) 25 MG tablet Take 1 tablet (25 mg total) by mouth daily. 90 tablet 3  . rosuvastatin (CRESTOR) 5 MG tablet TAKE 1 TABLET(5 MG) BY MOUTH DAILY 90 tablet 0  . sodium bicarbonate 650 MG tablet Take 650 mg by mouth 2 (two) times daily.    [name] trimethoprim (TRIMPEX) 100 MG tablet Take 50 mg by mouth daily.     . VELTASSA 8.4 G packet Take 8.4 g by mouth daily after lunch.     . [DISCONTINUED] metoprolol succinate (TOPROL-XL) 50 MG 24 hr tablet Take 50 mg by mouth 2 (two) times daily. Take with or immediately following a meal.     No current facility-administered medications for this visit.    Allergies: Allergies  Allergen Reactions  . Lipitor [Atorvastatin] Other (See Comments)    Stomach pain  . Strawberry Extract Rash    Social  History: The patient  reports that she has quit smoking. Her smoking use included Cigarettes. She has never used smokeless tobacco. She reports that she drinks alcohol. She reports that she does not use illicit drugs.   Family History: The patient's She was adopted. Family history is unknown by patient.   Review of Systems: Please see the history of present illness.   Otherwise, the review of systems is  positive for none.   All other systems are reviewed and negative.   Physical Exam: VS:  BP 148/60 mmHg  Pulse 58  Ht 5\' 8"  (1.727 m)  Wt 132 lb (59.875 kg)  BMI 20.08 kg/m2 .  BMI Body mass index is 20.08 kg/(m^2).  Wt Readings from Last 3 Encounters:  02/23/16 132 lb (59.875 kg)  01/06/16 135 lb 11.2 oz (61.553 kg)  10/07/15 137 lb 9.6 oz (62.415 kg)    General: Pleasant. Unsteady. She is alert and in no acute distress.  HEENT: Normal. Neck: Supple, no JVD, carotid bruits, or masses noted.  Cardiac: Regular rate and rhythm. Harsh outflow murmur. No edema.  Respiratory:  Lungs are clear to auscultation bilaterally with normal work of breathing.  GI: Soft and nontender.  MS: No deformity or atrophy. Gait and ROM intact. Skin: Warm and dry. Color is pale. Neuro:  Strength and sensation are intact and no gross focal deficits noted.  Psych: Alert, appropriate and with normal affect.   LABORATORY DATA:  EKG:  EKG is ordered today. This demonstrates NSR. She has more inferior ST/T wave changes today. Septal infarct which is unchanged.  Lab Results  Component Value Date   WBC 5.3 02/17/2016   HGB 9.7* 02/17/2016   HCT 28.5* 02/17/2016   PLT 86* 02/17/2016   GLUCOSE 123 12/09/2015   CHOL 144 01/20/2015   TRIG 67.0 01/20/2015   HDL 87.10 01/20/2015   LDLDIRECT 34.4 11/17/2008   LDLCALC 44 01/20/2015   ALT 15 12/09/2015   AST 20 12/09/2015   NA 130* 12/09/2015   K 4.6 12/09/2015   CL 105 12/20/2013   CREATININE 4.1* 12/09/2015   BUN 70.2* 12/09/2015   CO2 17* 12/09/2015     TSH 2.185 01/17/2014   INR 1.4 10/16/2008   HGBA1C * 10/14/2008    6.5 (NOTE)   The ADA recommends the following therapeutic goal for glycemic   control related to Hgb A1C measurement:   Goal of Therapy:   < 7.0% Hgb A1C   Reference: American Diabetes Association: Clinical Practice   Recommendations 2008, Diabetes Care,  2008, 31:(Suppl 1).    BNP (last 3 results) No results for input(s): BNP in the last 8760 hours.  ProBNP (last 3 results) No results for input(s): PROBNP in the last 8760 hours.   Other Studies Reviewed Today:  Echo Study Conclusions 12/2013  - Left ventricle: The cavity size was normal. Wall thickness was normal. The estimated ejection fraction was 55%. Wall motion was normal; there were no regional wall motion abnormalities. Doppler parameters are consistent with abnormal left ventricular relaxation (grade 1 diastolic dysfunction). - Aortic valve: Trileaflet; moderately calcified leaflets. There was mild stenosis. Mean gradient: 80mm Hg (S). Peak gradient: 87mm Hg (S). Valve area: 1.61cm^2 (Vmax). - Mitral valve: Mildly calcified annulus. Normal thickness leaflets . Mild regurgitation. - Left atrium: The atrium was mildly dilated. - Right ventricle: The cavity size was normal. Systolic function was normal. - Tricuspid valve: Peak RV-RA gradient: 87mm Hg (S). - Pulmonary arteries: PA peak pressure: 37mm Hg (S). - Inferior vena cava: The vessel was normal in size; the respirophasic diameter changes were in the normal range (= 50%); findings are consistent with normal central venous pressure. Impressions:  - Normal LV size and systolic function, EF [id number]. Normal RV size and systolic function. Mild aortic stenosis. Mild MR.  Assessment/Plan:  1. CAD - Status post CABG from 2010. No ischemic symptoms but EKG with more inferior changes today -  will arrange Lexiscan Myoview. Continue ASA 81, Coreg, ARB, and statin.  2. AORTIC  STENOSIS  No symptoms - needs her echo updated  3. CAROTID ARTERY STENOSIS - needs her dopplers repeated in June of 2018.   4. HYPERTENSION - BP great on current regimen. Not dizzy or lightheaded.   5. Hyperlipidemia - Check lipids today.She has had her other labs  6. 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   7. Progressive CKD - followed by Renal in [place]  8. Anemia - I suspect this is from her kidney disease.  Current medicines are reviewed with the patient today.  The patient does not have concerns regarding medicines other than what has been noted above.  The following changes have been made:  See above.  Labs/ tests ordered today include:    Orders Placed This Encounter  Procedures  . Lipid panel  . Myocardial Perfusion Imaging  . EKG 12-Lead  . ECHOCARDIOGRAM COMPLETE     Disposition:   FU with me in 1 year.   Patient is agreeable to this plan and will call if any problems develop in the interim.   Signed: [name], RN, ANP-C 02/23/2016 9:17 AM  [place] [address] [place], [place]  [address] Phone: [phone] Fax: [phone]

## 2016-02-23 NOTE — Patient Instructions (Addendum)
We will be checking the following labs today - Lipids   Medication Instructions:    Continue with your current medicines.     Testing/Procedures To Be Arranged:  Echocardiogram  Lexiscan Myoview  Follow-Up:   See me tentatively back in one year - I will see you back sooner if your studies are abnormal    Other Special Instructions:   N/A    If you need a refill on your cardiac medications before your next appointment, please call your pharmacy.   Call the [place] office at [phone] if you have any questions, problems or concerns.

## 2016-03-04 ENCOUNTER — Encounter (HOSPITAL_COMMUNITY): Payer: Medicare HMO

## 2016-03-08 ENCOUNTER — Telehealth (HOSPITAL_COMMUNITY): Payer: Self-pay | Admitting: *Deleted

## 2016-03-08 NOTE — Telephone Encounter (Signed)
Patient given detailed instructions per Myocardial Perfusion Study Information Sheet for the test on 03/10/16 at 0745. Patient notified to arrive 15 minutes early and that it is imperative to arrive on time for appointment to keep from having the test rescheduled.  If you need to cancel or reschedule your appointment, please call the office within 24 hours of your appointment. Failure to do so may result in a cancellation of your appointment, and a $50 no show fee. Patient verbalized understanding.Keen Ewalt, Ranae [place]ms

## 2016-03-09 ENCOUNTER — Other Ambulatory Visit (HOSPITAL_BASED_OUTPATIENT_CLINIC_OR_DEPARTMENT_OTHER): Payer: Medicare HMO

## 2016-03-09 ENCOUNTER — Ambulatory Visit: Payer: Medicare HMO

## 2016-03-09 ENCOUNTER — Other Ambulatory Visit (HOSPITAL_COMMUNITY): Payer: Medicare HMO

## 2016-03-09 ENCOUNTER — Encounter (HOSPITAL_COMMUNITY): Payer: Medicare HMO

## 2016-03-09 DIAGNOSIS — N189 Chronic kidney disease, unspecified: Secondary | ICD-10-CM | POA: Diagnosis not present

## 2016-03-09 DIAGNOSIS — D631 Anemia in chronic kidney disease: Secondary | ICD-10-CM | POA: Diagnosis not present

## 2016-03-09 LAB — CBC WITH DIFFERENTIAL/PLATELET
BASO%: 0.6 % (ref 0.0–2.0)
Basophils Absolute: 0 10*3/uL (ref 0.0–0.1)
EOS%: 4.2 % (ref 0.0–7.0)
Eosinophils Absolute: 0.2 10*3/uL (ref 0.0–0.5)
HCT: 33.6 % — ABNORMAL LOW (ref 34.8–46.6)
HGB: 11.1 g/dL — ABNORMAL LOW (ref 11.6–15.9)
LYMPH%: 31.9 % (ref 14.0–49.7)
MCH: 33.7 pg (ref 25.1–34.0)
MCHC: 33 g/dL (ref 31.5–36.0)
MCV: 102.1 fL — ABNORMAL HIGH (ref 79.5–101.0)
MONO#: 0.8 10*3/uL (ref 0.1–0.9)
MONO%: 17 % — AB (ref 0.0–14.0)
NEUT%: 46.3 % (ref 38.4–76.8)
NEUTROS ABS: 2.2 10*3/uL (ref 1.5–6.5)
NRBC: 0 % (ref 0–0)
Platelets: 86 10*3/uL — ABNORMAL LOW (ref 145–400)
RBC: 3.29 10*6/uL — AB (ref 3.70–5.45)
RDW: 14.5 % (ref 11.2–14.5)
WBC: 4.8 10*3/uL (ref 3.9–10.3)
lymph#: 1.5 10*3/uL (ref 0.9–3.3)

## 2016-03-09 NOTE — Progress Notes (Signed)
Pt's labs showed Hgb of 11.1. Injection not needed per protocol orders. Pt given copy of todays labs and current schedule. Pt instructed to keep upcoming appointments and to call office if issues occur. Pt verbalized understanding

## 2016-03-10 ENCOUNTER — Encounter (HOSPITAL_COMMUNITY): Payer: Medicare HMO

## 2016-03-10 ENCOUNTER — Other Ambulatory Visit (HOSPITAL_COMMUNITY): Payer: Self-pay

## 2016-03-10 ENCOUNTER — Ambulatory Visit (HOSPITAL_BASED_OUTPATIENT_CLINIC_OR_DEPARTMENT_OTHER): Payer: Medicare HMO

## 2016-03-10 ENCOUNTER — Ambulatory Visit (HOSPITAL_COMMUNITY): Payer: Medicare HMO | Attending: Cardiology

## 2016-03-10 DIAGNOSIS — I259 Chronic ischemic heart disease, unspecified: Secondary | ICD-10-CM | POA: Diagnosis not present

## 2016-03-10 DIAGNOSIS — I509 Heart failure, unspecified: Secondary | ICD-10-CM | POA: Diagnosis not present

## 2016-03-10 DIAGNOSIS — R9431 Abnormal electrocardiogram [ECG] [EKG]: Secondary | ICD-10-CM

## 2016-03-10 DIAGNOSIS — I350 Nonrheumatic aortic (valve) stenosis: Secondary | ICD-10-CM

## 2016-03-10 DIAGNOSIS — I6529 Occlusion and stenosis of unspecified carotid artery: Secondary | ICD-10-CM | POA: Diagnosis not present

## 2016-03-10 DIAGNOSIS — D696 Thrombocytopenia, unspecified: Secondary | ICD-10-CM | POA: Insufficient documentation

## 2016-03-10 DIAGNOSIS — N186 End stage renal disease: Secondary | ICD-10-CM | POA: Insufficient documentation

## 2016-03-10 DIAGNOSIS — I340 Nonrheumatic mitral (valve) insufficiency: Secondary | ICD-10-CM | POA: Insufficient documentation

## 2016-03-10 DIAGNOSIS — E1122 Type 2 diabetes mellitus with diabetic chronic kidney disease: Secondary | ICD-10-CM | POA: Insufficient documentation

## 2016-03-10 DIAGNOSIS — E785 Hyperlipidemia, unspecified: Secondary | ICD-10-CM

## 2016-03-10 DIAGNOSIS — R9439 Abnormal result of other cardiovascular function study: Secondary | ICD-10-CM | POA: Diagnosis not present

## 2016-03-10 DIAGNOSIS — I2510 Atherosclerotic heart disease of native coronary artery without angina pectoris: Secondary | ICD-10-CM | POA: Insufficient documentation

## 2016-03-10 DIAGNOSIS — Z87891 Personal history of nicotine dependence: Secondary | ICD-10-CM | POA: Insufficient documentation

## 2016-03-10 DIAGNOSIS — I10 Essential (primary) hypertension: Secondary | ICD-10-CM | POA: Diagnosis not present

## 2016-03-10 DIAGNOSIS — I132 Hypertensive heart and chronic kidney disease with heart failure and with stage 5 chronic kidney disease, or end stage renal disease: Secondary | ICD-10-CM | POA: Insufficient documentation

## 2016-03-10 DIAGNOSIS — I779 Disorder of arteries and arterioles, unspecified: Secondary | ICD-10-CM | POA: Insufficient documentation

## 2016-03-10 DIAGNOSIS — R5383 Other fatigue: Secondary | ICD-10-CM | POA: Diagnosis not present

## 2016-03-10 LAB — MYOCARDIAL PERFUSION IMAGING
LV dias vol: 93 mL (ref 46–106)
LV sys vol: 43 mL
Peak HR: 68 {beats}/min
RATE: 0.26
Rest HR: 58 {beats}/min
SDS: 7
SRS: 8
SSS: 15
TID: 1.03

## 2016-03-10 MED ORDER — REGADENOSON 0.4 MG/5ML IV SOLN
0.4000 mg | Freq: Once | INTRAVENOUS | Status: AC
  Administered 2016-03-10: 0.4 mg via INTRAVENOUS

## 2016-03-10 MED ORDER — TECHNETIUM TC 99M TETROFOSMIN IV KIT
10.3000 | PACK | Freq: Once | INTRAVENOUS | Status: AC | PRN
  Administered 2016-03-10: 10 via INTRAVENOUS
  Filled 2016-03-10: qty 10

## 2016-03-10 MED ORDER — TECHNETIUM TC 99M TETROFOSMIN IV KIT
32.5000 | PACK | Freq: Once | INTRAVENOUS | Status: AC | PRN
  Administered 2016-03-10: 33 via INTRAVENOUS
  Filled 2016-03-10: qty 33

## 2016-03-12 ENCOUNTER — Other Ambulatory Visit: Payer: Self-pay | Admitting: Nurse Practitioner

## 2016-03-30 ENCOUNTER — Ambulatory Visit: Payer: Medicare HMO

## 2016-03-30 ENCOUNTER — Encounter: Payer: Self-pay | Admitting: *Deleted

## 2016-03-30 ENCOUNTER — Other Ambulatory Visit (HOSPITAL_BASED_OUTPATIENT_CLINIC_OR_DEPARTMENT_OTHER): Payer: Medicare HMO

## 2016-03-30 DIAGNOSIS — N189 Chronic kidney disease, unspecified: Secondary | ICD-10-CM

## 2016-03-30 DIAGNOSIS — D631 Anemia in chronic kidney disease: Secondary | ICD-10-CM

## 2016-03-30 DIAGNOSIS — D500 Iron deficiency anemia secondary to blood loss (chronic): Secondary | ICD-10-CM

## 2016-03-30 LAB — IRON AND TIBC
%SAT: 44 % (ref 21–57)
Iron: 119 ug/dL (ref 41–142)
TIBC: 274 ug/dL (ref 236–444)
UIBC: 155 ug/dL (ref 120–384)

## 2016-03-30 LAB — CBC WITH DIFFERENTIAL/PLATELET
BASO%: 0.5 % (ref 0.0–2.0)
Basophils Absolute: 0 10*3/uL (ref 0.0–0.1)
EOS ABS: 0.2 10*3/uL (ref 0.0–0.5)
EOS%: 3.5 % (ref 0.0–7.0)
HCT: 30.1 % — ABNORMAL LOW (ref 34.8–46.6)
HGB: 10 g/dL — ABNORMAL LOW (ref 11.6–15.9)
LYMPH%: 31.4 % (ref 14.0–49.7)
MCH: 33.6 pg (ref 25.1–34.0)
MCHC: 33.2 g/dL (ref 31.5–36.0)
MCV: 101 fL (ref 79.5–101.0)
MONO#: 0.4 10*3/uL (ref 0.1–0.9)
MONO%: 7.6 % (ref 0.0–14.0)
NEUT#: 3.2 10*3/uL (ref 1.5–6.5)
NEUT%: 57 % (ref 38.4–76.8)
PLATELETS: 102 10*3/uL — AB (ref 145–400)
RBC: 2.98 10*6/uL — AB (ref 3.70–5.45)
RDW: 13.1 % (ref 11.2–14.5)
WBC: 5.7 10*3/uL (ref 3.9–10.3)
lymph#: 1.8 10*3/uL (ref 0.9–3.3)

## 2016-03-30 LAB — COMPREHENSIVE METABOLIC PANEL
ALT: 21 U/L (ref 0–55)
ANION GAP: 9 meq/L (ref 3–11)
AST: 22 U/L (ref 5–34)
Albumin: 3.6 g/dL (ref 3.5–5.0)
Alkaline Phosphatase: 74 U/L (ref 40–150)
BUN: 67.6 mg/dL — ABNORMAL HIGH (ref 7.0–26.0)
CHLORIDE: 109 meq/L (ref 98–109)
CO2: 18 meq/L — AB (ref 22–29)
Calcium: 9.3 mg/dL (ref 8.4–10.4)
Creatinine: 3.8 mg/dL (ref 0.6–1.1)
EGFR: 11 mL/min/{1.73_m2} — AB (ref >90)
Glucose: 113 mg/dl (ref 70–140)
Potassium: 4.9 mEq/L (ref 3.5–5.1)
Sodium: 136 mEq/L (ref 136–145)
Total Bilirubin: 0.32 mg/dL (ref 0.20–1.20)
Total Protein: 7 g/dL (ref 6.4–8.3)

## 2016-03-30 LAB — FERRITIN: FERRITIN: 122 ng/mL (ref 9–269)

## 2016-03-30 NOTE — Progress Notes (Signed)
Pt's Hgb at 10.0 today. Reviewed results with Dr. Alen Blew. No injection needed at this time. Current copy of labs and schedule given to pt.

## 2016-04-01 ENCOUNTER — Encounter (INDEPENDENT_AMBULATORY_CARE_PROVIDER_SITE_OTHER): Payer: Self-pay

## 2016-04-01 ENCOUNTER — Encounter: Payer: Self-pay | Admitting: Nurse Practitioner

## 2016-04-01 ENCOUNTER — Ambulatory Visit (INDEPENDENT_AMBULATORY_CARE_PROVIDER_SITE_OTHER): Payer: Medicare HMO | Admitting: Nurse Practitioner

## 2016-04-01 VITALS — BP 118/60 | HR 72 | Ht 68.0 in | Wt 132.8 lb

## 2016-04-01 DIAGNOSIS — N186 End stage renal disease: Secondary | ICD-10-CM | POA: Diagnosis not present

## 2016-04-01 DIAGNOSIS — I350 Nonrheumatic aortic (valve) stenosis: Secondary | ICD-10-CM | POA: Diagnosis not present

## 2016-04-01 DIAGNOSIS — I259 Chronic ischemic heart disease, unspecified: Secondary | ICD-10-CM | POA: Diagnosis not present

## 2016-04-01 DIAGNOSIS — E785 Hyperlipidemia, unspecified: Secondary | ICD-10-CM | POA: Diagnosis not present

## 2016-04-01 NOTE — Progress Notes (Signed)
CARDIOLOGY OFFICE NOTE  Date:  04/01/2016    [name] Date of Birth: 10-Jan-1941 Medical Record [id number]  PCP:  [name], MD  Cardiologist:  Annabell Howells   Chief Complaint  Patient presents with  . Shortness of Breath    WALKING  . Fatigue    History of Present Illness: [name] is a 75 y.o. female who presents today for a follow up visit. Seen for Dr. Aundra Dubin.   She has a h/o CAD s/p CABG in [id number] and diastolic CHF. She has chronic anemia, balance issues, HTN, DM, HLD, CKD, chronic back pain, anxiety and known valvular heart disease.   Last seen here a little over a year ago - balance still an issue. She is followed by Nephrology in [place] - has seen Dr. Kellie Simmering for AV fistula consult. Kidneys were failing and she was nearing dialysis.   Seen last month - feeling ok. EKG with more changes. Updated her Myoview - see below. Was asked to come back for discussion.   Comes back today. Here with her husband and son. She notes she is doing about the same. She is fatigued but she feels this is more related to her kidney disease and anemia. No actual chest pain. Little dizzy if she stands too quick. No syncope. She will get short of breath if she really over exerts. She is still happy with how she is feeling. She really wants to avoid cardiac catheterization if at all possible.   Past Medical History:  1. CAD: Pt presented 2/10 to [facility] with NSTEMI and diastolic CHF exacerbation. LHC was done 3/10 showing 99% pRCA stenosis and 80% calcified pLAD stenosis with L=>R collaterals. Pt was referred for CABG which was done by Dr. Prescott Gum with LIMA-LAD, SVG-RCA, SVG-OM.  2. Diastolic CHF: Echo ([id number]) showed EF 55-65%, mild LVH, diastolic dysfunction, mild AS with mean gradient 12 mmHg, PASP 43 mmHg. Echo (2/12): EF 55-60%, mild LVH, mild AS (mean gradient 12), PA systolic pressure 32 mmHg. Echo (5/15) with EF 55%, mild MR, mild aortic stenosis.  3. CKD 4. DM2   5. HTN  6. Hyperlipidemia: nausea/GI discomfort with atorvastatin, ? Balance problems with pravastatin.  7. Anxiety  8. Chronic low back pain  9. History of thrombocytopenia  10. 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 Followed by hematology, getting Aranesp injections 11. Aortic stenosis: mean gradient 12 mmHg in 2/12. Mean gradient 18 mmHg with AVA 1.6 cm^2 in 5/15.  12. Carotid stenosis: 40-59% bilateral ICA stenosis in [id number]. [id number] LICA stenosis in [id number]. Carotids (0000000) with 123456 LICA stenosis.  13. PAD: ABIs 3/12 were normal but TBIs were mildly decreased. ABIs/TBIs (10/13) normal.   Past Medical History:  Diagnosis Date  . Anemia    Pt is taking iron.   [name] Anxiety   . Arthritis   . Carotid stenosis    40-59% bilateral ICA stenosis in 2/12.  . Chronic low back pain   . CKD (chronic kidney disease)    Dr. Audie Clear at [facility]  . Coronary artery disease    Pt presented 2/10 to [facility] with NSTEMI and diastolic CHF exacerbation.  LHC was done  3/10 showing 99% pRCA stenosis and 80% calcified pLAD stenosis with L=>R collaterals.  Pt was referred  for CABG which was done by Dr. Prescott Gum with LIMA-LAD, SVG-RCA, SVG-OM.  . Diabetes mellitus   . Diabetic neuropathy ([place])   . Diastolic CHF ([place])  Echo (2/10) showed EF 55-65%, mild LVH, diastolic dysfunction, mild AS with mean gradient 12 mmHg, PASP 43 mmHg.  Echo (2/12): EF 55-60%, mild LVH, mild AS (mean gradient 12), PA systolic pressure 32 mmHg.     [name] GERD (gastroesophageal reflux disease)   . Heart murmur   . Hyperlipidemia   . Hypertension   . Mild aortic stenosis    mean gradient 12 mmHg in 2/12.  . Myocardial infarction ([place])    "mild"  . Pneumonia   . PONV (postoperative nausea and vomiting)   . Thrombocytopenia ([place])   . Unsteady gait     Past Surgical History:  Procedure Laterality Date  . AV FISTULA PLACEMENT Left 01/30/2015   Procedure: Creation of a Radial Cephalic AV  Fistula left wrist;  Surgeon: [name], MD;  Location: [place];  Service: Vascular;  Laterality: Left;  . BACK SURGERY     multiple  . BREAST SURGERY     biopsy  . CARDIAC CATHETERIZATION    . CATARACT EXTRACTION W/ INTRAOCULAR LENS  IMPLANT, BILATERAL    . COLONOSCOPY W/ BIOPSIES AND POLYPECTOMY    . CORONARY ARTERY BYPASS GRAFT  09/2008   pt with NSTEMI and diastolic CHF exacerbation.  LHC was done  3/10 showing 99% pRCA stenosis and 80% calcified pLAD stenosis with L=>R collaterals.  Pt was referred  for CABG which was done by Dr. Prescott Gum with LIMA-LAD, SVG-RCA, SVG-OM.  [name] REVISON OF ARTERIOVENOUS FISTULA Left 07/02/2015   Procedure: REVISON OF LEFT RADIOCEPHALIC ARTERIOVENOUS FISTULA;  Surgeon: [name], MD;  Location: [facility] OR;  Service: Vascular;  Laterality: Left;     Medications: Current Outpatient Prescriptions  Medication Sig Dispense Refill  . ALPRAZolam (XANAX) 0.5 MG tablet Take 0.5 mg by mouth at bedtime.     [name] amLODipine (NORVASC) 5 MG tablet Take 1 tablet (5 mg total) by mouth daily. 90 tablet 3  . aspirin 81 MG tablet Take 81 mg by mouth daily.     . calcium acetate (PHOSLO) 667 MG capsule Take 1,334 mg by mouth 3 (three) times daily with meals. As directed    . carvedilol (COREG) 12.5 MG tablet TAKE 1 TABLET(12.5 MG) BY MOUTH TWICE DAILY 60 tablet 9  . cholecalciferol (VITAMIN D) 1000 UNITS tablet Take 1,000 Units by mouth daily.    . furosemide (LASIX) 20 MG tablet Take 20 mg by mouth daily. As directed    . losartan (COZAAR) 25 MG tablet Take 1 tablet (25 mg total) by mouth daily. 90 tablet 3  . rosuvastatin (CRESTOR) 5 MG tablet TAKE 1 TABLET(5 MG) BY MOUTH DAILY 90 tablet 0  . sodium bicarbonate 650 MG tablet Take 650 mg by mouth 2 (two) times daily.    [name] trimethoprim (TRIMPEX) 100 MG tablet Take 50 mg by mouth daily.     . VELTASSA 8.4 G packet Take 8.4 g by mouth daily after lunch.      No current facility-administered medications for this visit.      Allergies: Allergies  Allergen Reactions  . Lipitor [Atorvastatin] Other (See Comments)    Stomach pain  . Strawberry Extract Rash    Social History: The patient  reports that she has quit smoking. Her smoking use included Cigarettes. She has never used smokeless tobacco. She reports that she drinks alcohol. She reports that she does not use drugs.   Family History: The patient's She was adopted. Family history is unknown by patient.   Review of  Systems: Please see the history of present illness.   Otherwise, the review of systems is positive for none.   All other systems are reviewed and negative.   Physical Exam: VS:  BP 118/60 (BP Location: Right Arm, Patient Position: Sitting, Cuff Size: Normal)   Pulse 72   Ht 5\' 8"  (1.727 m)   Wt 132 lb 12.8 oz (60.2 kg)   BMI 20.19 kg/m  .  BMI Body mass index is 20.19 kg/m.  Wt Readings from Last 3 Encounters:  04/01/16 132 lb 12.8 oz (60.2 kg)  02/23/16 132 lb (59.9 kg)  01/06/16 135 lb 11.2 oz (61.6 kg)    General: Pleasant. Chronically ill appearing but alert and in no acute distress.  Color remains pale. HEENT: Normal.  Neck: Supple, no JVD, carotid bruits, or masses noted.  Cardiac: Regular rate and rhythm. Outflow murmur noted. No edema.  Respiratory:  Lungs are clear to auscultation bilaterally with normal work of breathing.  GI: Soft and nontender.  MS: No deformity or atrophy. Gait and ROM intact.  Skin: Warm and dry. Color is normal.  Neuro:  Strength and sensation are intact and no gross focal deficits noted.  Psych: Alert, appropriate and with normal affect.   LABORATORY DATA:  EKG:  EKG is not ordered today.   Lab Results  Component Value Date   WBC 5.7 03/30/2016   HGB 10.0 (L) 03/30/2016   HCT 30.1 (L) 03/30/2016   PLT 102 (L) 03/30/2016   GLUCOSE 113 03/30/2016   CHOL 123 (L) 02/23/2016   TRIG 62 02/23/2016   HDL 75 02/23/2016   LDLDIRECT 34.4 11/17/2008   LDLCALC 36 02/23/2016   ALT 21  03/30/2016   AST 22 03/30/2016   NA 136 03/30/2016   K 4.9 03/30/2016   CL 105 12/20/2013   CREATININE 3.8 (HH) 03/30/2016   BUN 67.6 (H) 03/30/2016   CO2 18 (L) 03/30/2016   TSH 2.185 01/17/2014   INR 1.4 10/16/2008   HGBA1C (H) 10/14/2008    6.5 (NOTE)   The ADA recommends the following therapeutic goal for glycemic   control related to Hgb A1C measurement:   Goal of Therapy:   < 7.0% Hgb A1C   Reference: American Diabetes Association: Clinical Practice   Recommendations 2008, Diabetes Care,  2008, 31:(Suppl 1).    BNP (last 3 results) No results for input(s): BNP in the last 8760 hours.  ProBNP (last 3 results) No results for input(s): PROBNP in the last 8760 hours.   Other Studies Reviewed Today:  Myoview Study Highlights 02/2016    Nuclear stress EF: 54%. There is mid inferior wall hypokinesis  There was no ST segment deviation noted during stress.  Defect 1: There is a medium defect of moderate severity present in the basal inferior and mid inferior location.  The mid inferior wall perfusion defect is reversible consistent with ischemia. The basal inferior wall defect is mostly fixed consistent with infarct.  This is an intermediate risk study. Consider occlusion of SVG to RCA graft.   Candee Furbish, MD    Echo Study Conclusions from 02/2016  - Left ventricle: The cavity size was normal. Wall thickness was   increased in a pattern of mild LVH. Systolic function was normal.   The estimated ejection fraction was in the range of 55% to 60%.   Wall motion was normal; there were no regional wall motion   abnormalities. Doppler parameters are consistent with abnormal   left ventricular relaxation (grade 1  diastolic dysfunction). - Aortic valve: Severely calcified leaflets. The left and right   coronary cusps appear fused, functionally bicuspid. Mild aortic   stenosis by mean gradient, visually looks moderate. Mean gradient   (S): 11 mm Hg. - Mitral valve: Mildly to  moderately calcified annulus. Mildly   calcified leaflets . There was trivial regurgitation. - Left atrium: The atrium was mildly dilated. - Right ventricle: The cavity size was normal. Systolic function   was mildly reduced. - Tricuspid valve: Peak RV-RA gradient (S): 31 mm Hg. - Pulmonary arteries: PA peak pressure: 34 mm Hg (S). - Inferior vena cava: The vessel was normal in size. The   respirophasic diameter changes were in the normal range (>= 50%),   consistent with normal central venous pressure.  Impressions:  - Normal LV size with mild LV hypertrophy. EF 55-60%. Normal RV   size with mildly reduced systolic function. Functionally bicuspid   aortic valve, mild stenosis by mean gradient but moderate   stenosis visually.  Assessment/Plan:  1. CAD - Status post CABG from 2010. Now with intermediate Myoview - reviewed with Dr. Aundra Dubin. No active chest pain. Will continue with her current medical regimen for now. At some point she will be on dialysis and we may need to proceed. She would like to hold off for now as well. Continue with aspirin, statin and beta blocker  2. AORTIC STENOSIS  No symptoms - recent echo noted - mild by gradient but moderate by visual inspection.   3. CAROTID ARTERY STENOSIS - needs her dopplers repeated in June of 2018.   4. HYPERTENSION - BP great on current regimen. No change with her current regimen.   5. Hyperlipidemia - on statin therapy  6. 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   7. Progressive CKD - followed by Renal in [place]  8. Anemia - I suspect this is from her kidney disease.    Current medicines are reviewed with the patient today.  The patient does not have concerns regarding medicines other than what has been noted above.  The following changes have been made:  See above.  Labs/ tests ordered today include:   No orders of the defined types were placed in this encounter.    Disposition:    FU with me in 3 months.    Patient is agreeable to this plan and will call if any problems develop in the interim.   Signed: [name], RN, ANP-C 04/01/2016 3:08 PM  [facility] [address] [place], [place]  [address] Phone: [phone] Fax: [phone]

## 2016-04-01 NOTE — Patient Instructions (Addendum)
We will be checking the following labs today - NONE   Medication Instructions:    Continue with your current medicines.     Testing/Procedures To Be Arranged:  N/A  Follow-Up:   See me in 3 months    Other Special Instructions:   N/A    If you need a refill on your cardiac medications before your next appointment, please call your pharmacy.   Call the [facility] office at [phone] if you have any questions, problems or concerns.      

## 2016-04-04 DIAGNOSIS — N185 Chronic kidney disease, stage 5: Secondary | ICD-10-CM | POA: Diagnosis not present

## 2016-04-08 DIAGNOSIS — M899 Disorder of bone, unspecified: Secondary | ICD-10-CM | POA: Diagnosis not present

## 2016-04-08 DIAGNOSIS — D631 Anemia in chronic kidney disease: Secondary | ICD-10-CM | POA: Diagnosis not present

## 2016-04-08 DIAGNOSIS — N185 Chronic kidney disease, stage 5: Secondary | ICD-10-CM | POA: Diagnosis not present

## 2016-04-08 DIAGNOSIS — I120 Hypertensive chronic kidney disease with stage 5 chronic kidney disease or end stage renal disease: Secondary | ICD-10-CM | POA: Diagnosis not present

## 2016-04-08 DIAGNOSIS — N189 Chronic kidney disease, unspecified: Secondary | ICD-10-CM | POA: Diagnosis not present

## 2016-04-08 DIAGNOSIS — I2510 Atherosclerotic heart disease of native coronary artery without angina pectoris: Secondary | ICD-10-CM | POA: Diagnosis not present

## 2016-04-08 DIAGNOSIS — E1122 Type 2 diabetes mellitus with diabetic chronic kidney disease: Secondary | ICD-10-CM | POA: Diagnosis not present

## 2016-04-19 DIAGNOSIS — R69 Illness, unspecified: Secondary | ICD-10-CM | POA: Diagnosis not present

## 2016-04-20 ENCOUNTER — Ambulatory Visit (HOSPITAL_BASED_OUTPATIENT_CLINIC_OR_DEPARTMENT_OTHER): Payer: Medicare HMO

## 2016-04-20 ENCOUNTER — Ambulatory Visit (HOSPITAL_BASED_OUTPATIENT_CLINIC_OR_DEPARTMENT_OTHER): Payer: Medicare HMO | Admitting: Oncology

## 2016-04-20 ENCOUNTER — Telehealth: Payer: Self-pay | Admitting: Oncology

## 2016-04-20 ENCOUNTER — Other Ambulatory Visit (HOSPITAL_BASED_OUTPATIENT_CLINIC_OR_DEPARTMENT_OTHER): Payer: Medicare HMO

## 2016-04-20 VITALS — BP 116/58 | HR 60 | Temp 97.5°F | Resp 17 | Wt 134.8 lb

## 2016-04-20 DIAGNOSIS — D696 Thrombocytopenia, unspecified: Secondary | ICD-10-CM

## 2016-04-20 DIAGNOSIS — N189 Chronic kidney disease, unspecified: Secondary | ICD-10-CM

## 2016-04-20 DIAGNOSIS — D631 Anemia in chronic kidney disease: Secondary | ICD-10-CM

## 2016-04-20 DIAGNOSIS — D509 Iron deficiency anemia, unspecified: Secondary | ICD-10-CM

## 2016-04-20 DIAGNOSIS — D500 Iron deficiency anemia secondary to blood loss (chronic): Secondary | ICD-10-CM

## 2016-04-20 DIAGNOSIS — N184 Chronic kidney disease, stage 4 (severe): Secondary | ICD-10-CM

## 2016-04-20 LAB — CBC WITH DIFFERENTIAL/PLATELET
BASO%: 0.4 % (ref 0.0–2.0)
BASOS ABS: 0 10*3/uL (ref 0.0–0.1)
EOS%: 3.1 % (ref 0.0–7.0)
Eosinophils Absolute: 0.2 10*3/uL (ref 0.0–0.5)
HCT: 23.6 % — ABNORMAL LOW (ref 34.8–46.6)
HGB: 7.8 g/dL — ABNORMAL LOW (ref 11.6–15.9)
LYMPH%: 27.5 % (ref 14.0–49.7)
MCH: 32.9 pg (ref 25.1–34.0)
MCHC: 33.1 g/dL (ref 31.5–36.0)
MCV: 99.6 fL (ref 79.5–101.0)
MONO#: 0.7 10*3/uL (ref 0.1–0.9)
MONO%: 13.5 % (ref 0.0–14.0)
NEUT#: 2.8 10*3/uL (ref 1.5–6.5)
NEUT%: 55.5 % (ref 38.4–76.8)
NRBC: 0 % (ref 0–0)
Platelets: 86 10*3/uL — ABNORMAL LOW (ref 145–400)
RBC: 2.37 10*6/uL — AB (ref 3.70–5.45)
RDW: 13.3 % (ref 11.2–14.5)
WBC: 5.1 10*3/uL (ref 3.9–10.3)
lymph#: 1.4 10*3/uL (ref 0.9–3.3)

## 2016-04-20 MED ORDER — DARBEPOETIN ALFA 300 MCG/0.6ML IJ SOSY
300.0000 ug | PREFILLED_SYRINGE | Freq: Once | INTRAMUSCULAR | Status: AC
  Administered 2016-04-20: 300 ug via SUBCUTANEOUS
  Filled 2016-04-20: qty 0.6

## 2016-04-20 NOTE — Telephone Encounter (Signed)
Gave patient avs report and appointments for September thru November  °

## 2016-04-20 NOTE — Patient Instructions (Signed)
Darbepoetin Alfa injection What is this medicine? DARBEPOETIN ALFA (dar be POE e tin AL fa) helps your body make more red blood cells. It is used to treat anemia caused by chronic kidney failure and chemotherapy. This medicine may be used for other purposes; ask your health care provider or pharmacist if you have questions. COMMON BRAND NAME(S): Aranesp What should I tell my health care provider before I take this medicine? They need to know if you have any of these conditions: -blood clotting disorders or history of blood clots -cancer patient not on chemotherapy -cystic fibrosis -heart disease, such as angina, heart failure, or a history of a heart attack -hemoglobin level of 12 g/dL or greater -high blood pressure -low levels of folate, iron, or vitamin B12 -seizures -an unusual or allergic reaction to darbepoetin, erythropoietin, albumin, hamster proteins, latex, other medicines, foods, dyes, or preservatives -pregnant or trying to get pregnant -breast-feeding How should I use this medicine? This medicine is for injection into a vein or under the skin. It is usually given by a health care professional in a hospital or clinic setting. If you get this medicine at home, you will be taught how to prepare and give this medicine. Do not shake the solution before you withdraw a dose. Use exactly as directed. Take your medicine at regular intervals. Do not take your medicine more often than directed. It is important that you put your used needles and syringes in a special sharps container. Do not put them in a trash can. If you do not have a sharps container, call your pharmacist or healthcare provider to get one. Talk to your pediatrician regarding the use of this medicine in children. While this medicine may be used in children as young as 1 year for selected conditions, precautions do apply. Overdosage: If you think you have taken too much of this medicine contact a poison control center or  emergency room at once. NOTE: This medicine is only for you. Do not share this medicine with others. What if I miss a dose? If you miss a dose, take it as soon as you can. If it is almost time for your next dose, take only that dose. Do not take double or extra doses. What may interact with this medicine? Do not take this medicine with any of the following medications: -epoetin alfa This list may not describe all possible interactions. Give your health care provider a list of all the medicines, herbs, non-prescription drugs, or dietary supplements you use. Also tell them if you smoke, drink alcohol, or use illegal drugs. Some items may interact with your medicine. What should I watch for while using this medicine? Visit your prescriber or health care professional for regular checks on your progress and for the needed blood tests and blood pressure measurements. It is especially important for the doctor to make sure your hemoglobin level is in the desired range, to limit the risk of potential side effects and to give you the best benefit. Keep all appointments for any recommended tests. Check your blood pressure as directed. Ask your doctor what your blood pressure should be and when you should contact him or her. As your body makes more red blood cells, you may need to take iron, folic acid, or vitamin B supplements. Ask your doctor or health care provider which products are right for you. If you have kidney disease continue dietary restrictions, even though this medication can make you feel better. Talk with your doctor or health   care professional about the foods you eat and the vitamins that you take. What side effects may I notice from receiving this medicine? Side effects that you should report to your doctor or health care professional as soon as possible: -allergic reactions like skin rash, itching or hives, swelling of the face, lips, or tongue -breathing problems -changes in vision -chest  pain -confusion, trouble speaking or understanding -feeling faint or lightheaded, falls -high blood pressure -muscle aches or pains -pain, swelling, warmth in the leg -rapid weight gain -severe headaches -sudden numbness or weakness of the face, arm or leg -trouble walking, dizziness, loss of balance or coordination -seizures (convulsions) -swelling of the ankles, feet, hands -unusually weak or tired Side effects that usually do not require medical attention (report to your doctor or health care professional if they continue or are bothersome): -diarrhea -fever, chills (flu-like symptoms) -headaches -nausea, vomiting -redness, stinging, or swelling at site where injected This list may not describe all possible side effects. Call your doctor for medical advice about side effects. You may report side effects to FDA at 1-800-FDA-1088. Where should I keep my medicine? Keep out of the reach of children. Store in a refrigerator between 2 and 8 degrees C (36 and 46 degrees F). Do not freeze. Do not shake. Throw away any unused portion if using a single-dose vial. Throw away any unused medicine after the expiration date. NOTE: This sheet is a summary. It may not cover all possible information. If you have questions about this medicine, talk to your doctor, pharmacist, or health care provider.  2015, [employer]. (2008-07-15 10:23:57)  

## 2016-04-20 NOTE — Progress Notes (Signed)
Hematology and Oncology Follow Up Visit  EMALIA HERTEL [id number] Oct 28, 1940 75 y.o. 04/20/2016 9:27 AM [name], MDWilson, [name], MD   Principle Diagnosis: 75 year old female with anemia of renal disease diagnosed in 2011. She has chronic renal insufficiency but has not required hemodialysis.  Current therapy: Aranesp 300 mcg subcutaneously every 3 weeks keeping hemoglobin above 11.  Interim History:  Mrs. Koroma presents today for a followup visit with her husband. Since the last visit, she reports no major changes in her health. She continues to be reasonable frail and continues to ambulate with the help of a cane. She denied any falls or syncope. She does report occasional dizziness but no chest pain or difficulty breathing.  She continues to receive Aranesp periodically to keep her hemoglobin above 10. Her hemoglobin have been above 10 for the last few months and have not received any injections. She is more fatigued today but denied any hematochezia or melena.  She was evaluated by cardiology and a new blockage have been diagnosed although she is currently receiving medical treatment only. She does have chronic renal insufficiency but have not required hemodialysis at this time. She denied any fluid retention or any indication for dialysis.  She has not reported any headaches or blurry vision or double vision. She does not report any chest pain shortness of breath or difficulty breathing. She does not report any nausea or vomiting or abdominal pain. She does not report any genitourinary complaints. She has not reported any bleeding or clotting tendencies. She has not reported any lymphadenopathy or petechiae. Rest of her review of systems unremarkable.  Medications: I have reviewed the patient's current medications.  Current Outpatient Prescriptions  Medication Sig Dispense Refill  . ALPRAZolam (XANAX) 0.5 MG tablet Take 0.5 mg by mouth at bedtime.     [name] amLODipine (NORVASC) 5 MG  tablet Take 1 tablet (5 mg total) by mouth daily. 90 tablet 3  . aspirin 81 MG tablet Take 81 mg by mouth daily.     . calcium acetate (PHOSLO) 667 MG capsule Take 1,334 mg by mouth 3 (three) times daily with meals. As directed    . carvedilol (COREG) 12.5 MG tablet TAKE 1 TABLET(12.5 MG) BY MOUTH TWICE DAILY 60 tablet 9  . cholecalciferol (VITAMIN D) 1000 UNITS tablet Take 1,000 Units by mouth daily.    . furosemide (LASIX) 20 MG tablet Take 20 mg by mouth daily. As directed    . losartan (COZAAR) 25 MG tablet Take 1 tablet (25 mg total) by mouth daily. 90 tablet 3  . rosuvastatin (CRESTOR) 5 MG tablet TAKE 1 TABLET(5 MG) BY MOUTH DAILY 90 tablet 0  . sodium bicarbonate 650 MG tablet Take 650 mg by mouth 2 (two) times daily.    [name] trimethoprim (TRIMPEX) 100 MG tablet Take 50 mg by mouth daily.     . VELTASSA 8.4 G packet Take 8.4 g by mouth daily after lunch.      No current facility-administered medications for this visit.      Allergies:  Allergies  Allergen Reactions  . Lipitor [Atorvastatin] Other (See Comments)    Stomach pain  . Strawberry Extract Rash    Past Medical History, Surgical history, Social history, and Family History were reviewed and updated.    Physical Exam: Blood pressure (!) 116/58, pulse 60, temperature 97.5 F (36.4 C), temperature source Oral, resp. rate 17, weight 134 lb 12.8 oz (61.1 kg), SpO2 100 %. ECOG: 1 General appearance: Chronically ill-appearing woman  appeared without distress. Slightly pale in color. Head: Normocephalic, without obvious abnormality no rebound or guarding. Neck: no adenopathy and no carotid bruit no thyroid masses. Lymph nodes: Cervical, supraclavicular, and axillary nodes normal. Heart:regular rate and rhythm, S1, S2 normal, no murmur, click, rub or gallop Lung:chest clear, no wheezing, rales, normal symmetric air entry. No dullness to percussion. Abdomen: soft, non-tender, without masses or organomegaly no rebound or  guarding. EXT:no erythema, induration, or nodules   Lab Results: Lab Results  Component Value Date   WBC 5.1 04/20/2016   HGB 7.8 (L) 04/20/2016   HCT 23.6 (L) 04/20/2016   MCV 99.6 04/20/2016   PLT 86 (L) 04/20/2016     Chemistry      Component Value Date/Time   NA 136 03/30/2016 0822   K 4.9 03/30/2016 0822   CL 105 12/20/2013 1103   CL 101 08/16/2012 0757   CO2 18 (L) 03/30/2016 0822   BUN 67.6 (H) 03/30/2016 0822   CREATININE 3.8 (HH) 03/30/2016 0822      Component Value Date/Time   CALCIUM 9.3 03/30/2016 0822   ALKPHOS 74 03/30/2016 0822   AST 22 03/30/2016 0822   ALT 21 03/30/2016 0822   BILITOT 0.32 03/30/2016 0822     Impression and Plan:   75 year old female with the following issues:   1. Anemia of renal disease. She is currently on Aranesp 300 g every 3 weeks to keep her hemoglobin above 10. Her iron studies obtained on 03/30/2016 oral within normal range.  Her hemoglobin today has drifted to 7.8 after not receiving Aranesp for the last few months. She is asymptomatic at this time except for mild fatigue. The plan is to proceed with Aranesp 300 g today and repeated every 3 weeks. We will increase the threshold of Aranesp up to 11 where she functions the best.  I offered her packed red cell transfusion. Risks and benefits were reviewed today but she declined. I do not feel that there is a imminent need for transfusion at this time. Her hemoglobin have been reviewed and previous reading indicate similar hemoglobin responded to Aranesp alone.  Regular clearance structures she develops any chest pain or dyspnea on exertion to report this as soon as possible for urgent packed red cell transfusion.  2. Thrombocytopenia: Chronic in nature which have been fluctuating between 80-100 for last 10 years. She has no signs or symptoms of bleeding we'll continue to monitor. 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  3. Renal failure:  Her creatinine clearance is close to 10 mL/m but have not received dialysis at this time.  4. Follow-up: CBC and Aranesp injection every 3 weeks. Visit in November 2017.      Tambi Thole 9/6/20179:27 AM

## 2016-04-22 DIAGNOSIS — R351 Nocturia: Secondary | ICD-10-CM | POA: Diagnosis not present

## 2016-05-11 ENCOUNTER — Ambulatory Visit (HOSPITAL_BASED_OUTPATIENT_CLINIC_OR_DEPARTMENT_OTHER): Payer: Medicare HMO

## 2016-05-11 ENCOUNTER — Other Ambulatory Visit (HOSPITAL_BASED_OUTPATIENT_CLINIC_OR_DEPARTMENT_OTHER): Payer: Medicare HMO

## 2016-05-11 VITALS — BP 153/57 | HR 59 | Temp 97.6°F | Resp 18

## 2016-05-11 DIAGNOSIS — N189 Chronic kidney disease, unspecified: Secondary | ICD-10-CM

## 2016-05-11 DIAGNOSIS — N184 Chronic kidney disease, stage 4 (severe): Secondary | ICD-10-CM

## 2016-05-11 DIAGNOSIS — D696 Thrombocytopenia, unspecified: Secondary | ICD-10-CM

## 2016-05-11 DIAGNOSIS — D631 Anemia in chronic kidney disease: Secondary | ICD-10-CM

## 2016-05-11 DIAGNOSIS — D509 Iron deficiency anemia, unspecified: Secondary | ICD-10-CM

## 2016-05-11 LAB — CBC WITH DIFFERENTIAL/PLATELET
BASO%: 0.5 % (ref 0.0–2.0)
Basophils Absolute: 0 10*3/uL (ref 0.0–0.1)
EOS ABS: 0.2 10*3/uL (ref 0.0–0.5)
EOS%: 3.4 % (ref 0.0–7.0)
HEMATOCRIT: 28.7 % — AB (ref 34.8–46.6)
HEMOGLOBIN: 9.3 g/dL — AB (ref 11.6–15.9)
LYMPH#: 1.3 10*3/uL (ref 0.9–3.3)
LYMPH%: 29.9 % (ref 14.0–49.7)
MCH: 33.8 pg (ref 25.1–34.0)
MCHC: 32.4 g/dL (ref 31.5–36.0)
MCV: 104.4 fL — ABNORMAL HIGH (ref 79.5–101.0)
MONO#: 0.7 10*3/uL (ref 0.1–0.9)
MONO%: 14.8 % — ABNORMAL HIGH (ref 0.0–14.0)
NEUT%: 51.4 % (ref 38.4–76.8)
NEUTROS ABS: 2.3 10*3/uL (ref 1.5–6.5)
PLATELETS: 74 10*3/uL — AB (ref 145–400)
RBC: 2.75 10*6/uL — ABNORMAL LOW (ref 3.70–5.45)
RDW: 14.3 % (ref 11.2–14.5)
WBC: 4.4 10*3/uL (ref 3.9–10.3)
nRBC: 0 % (ref 0–0)

## 2016-05-11 MED ORDER — DARBEPOETIN ALFA 300 MCG/0.6ML IJ SOSY
300.0000 ug | PREFILLED_SYRINGE | Freq: Once | INTRAMUSCULAR | Status: AC
  Administered 2016-05-11: 300 ug via SUBCUTANEOUS
  Filled 2016-05-11: qty 0.6

## 2016-05-11 NOTE — Patient Instructions (Signed)
Darbepoetin Alfa injection What is this medicine? DARBEPOETIN ALFA (dar be POE e tin AL fa) helps your body make more red blood cells. It is used to treat anemia caused by chronic kidney failure and chemotherapy. This medicine may be used for other purposes; ask your health care provider or pharmacist if you have questions. COMMON BRAND NAME(S): Aranesp What should I tell my health care provider before I take this medicine? They need to know if you have any of these conditions: -blood clotting disorders or history of blood clots -cancer patient not on chemotherapy -cystic fibrosis -heart disease, such as angina, heart failure, or a history of a heart attack -hemoglobin level of 12 g/dL or greater -high blood pressure -low levels of folate, iron, or vitamin B12 -seizures -an unusual or allergic reaction to darbepoetin, erythropoietin, albumin, hamster proteins, latex, other medicines, foods, dyes, or preservatives -pregnant or trying to get pregnant -breast-feeding How should I use this medicine? This medicine is for injection into a vein or under the skin. It is usually given by a health care professional in a hospital or clinic setting. If you get this medicine at home, you will be taught how to prepare and give this medicine. Do not shake the solution before you withdraw a dose. Use exactly as directed. Take your medicine at regular intervals. Do not take your medicine more often than directed. It is important that you put your used needles and syringes in a special sharps container. Do not put them in a trash can. If you do not have a sharps container, call your pharmacist or healthcare provider to get one. Talk to your pediatrician regarding the use of this medicine in children. While this medicine may be used in children as young as 1 year for selected conditions, precautions do apply. Overdosage: If you think you have taken too much of this medicine contact a poison control center or  emergency room at once. NOTE: This medicine is only for you. Do not share this medicine with others. What if I miss a dose? If you miss a dose, take it as soon as you can. If it is almost time for your next dose, take only that dose. Do not take double or extra doses. What may interact with this medicine? Do not take this medicine with any of the following medications: -epoetin alfa This list may not describe all possible interactions. Give your health care provider a list of all the medicines, herbs, non-prescription drugs, or dietary supplements you use. Also tell them if you smoke, drink alcohol, or use illegal drugs. Some items may interact with your medicine. What should I watch for while using this medicine? Visit your prescriber or health care professional for regular checks on your progress and for the needed blood tests and blood pressure measurements. It is especially important for the doctor to make sure your hemoglobin level is in the desired range, to limit the risk of potential side effects and to give you the best benefit. Keep all appointments for any recommended tests. Check your blood pressure as directed. Ask your doctor what your blood pressure should be and when you should contact him or her. As your body makes more red blood cells, you may need to take iron, folic acid, or vitamin B supplements. Ask your doctor or health care provider which products are right for you. If you have kidney disease continue dietary restrictions, even though this medication can make you feel better. Talk with your doctor or health   care professional about the foods you eat and the vitamins that you take. What side effects may I notice from receiving this medicine? Side effects that you should report to your doctor or health care professional as soon as possible: -allergic reactions like skin rash, itching or hives, swelling of the face, lips, or tongue -breathing problems -changes in vision -chest  pain -confusion, trouble speaking or understanding -feeling faint or lightheaded, falls -high blood pressure -muscle aches or pains -pain, swelling, warmth in the leg -rapid weight gain -severe headaches -sudden numbness or weakness of the face, arm or leg -trouble walking, dizziness, loss of balance or coordination -seizures (convulsions) -swelling of the ankles, feet, hands -unusually weak or tired Side effects that usually do not require medical attention (report to your doctor or health care professional if they continue or are bothersome): -diarrhea -fever, chills (flu-like symptoms) -headaches -nausea, vomiting -redness, stinging, or swelling at site where injected This list may not describe all possible side effects. Call your doctor for medical advice about side effects. You may report side effects to FDA at 1-800-FDA-1088. Where should I keep my medicine? Keep out of the reach of children. Store in a refrigerator between 2 and 8 degrees C (36 and 46 degrees F). Do not freeze. Do not shake. Throw away any unused portion if using a single-dose vial. Throw away any unused medicine after the expiration date. NOTE: This sheet is a summary. It may not cover all possible information. If you have questions about this medicine, talk to your doctor, pharmacist, or health care provider.  2015, [employer]. (2008-07-15 10:23:57)  

## 2016-05-16 DIAGNOSIS — N185 Chronic kidney disease, stage 5: Secondary | ICD-10-CM | POA: Diagnosis not present

## 2016-05-20 DIAGNOSIS — I120 Hypertensive chronic kidney disease with stage 5 chronic kidney disease or end stage renal disease: Secondary | ICD-10-CM | POA: Diagnosis not present

## 2016-05-20 DIAGNOSIS — E875 Hyperkalemia: Secondary | ICD-10-CM | POA: Diagnosis not present

## 2016-05-20 DIAGNOSIS — D631 Anemia in chronic kidney disease: Secondary | ICD-10-CM | POA: Diagnosis not present

## 2016-05-20 DIAGNOSIS — N185 Chronic kidney disease, stage 5: Secondary | ICD-10-CM | POA: Diagnosis not present

## 2016-05-20 DIAGNOSIS — E1122 Type 2 diabetes mellitus with diabetic chronic kidney disease: Secondary | ICD-10-CM | POA: Diagnosis not present

## 2016-05-20 DIAGNOSIS — E871 Hypo-osmolality and hyponatremia: Secondary | ICD-10-CM | POA: Diagnosis not present

## 2016-05-20 DIAGNOSIS — N189 Chronic kidney disease, unspecified: Secondary | ICD-10-CM | POA: Diagnosis not present

## 2016-05-20 DIAGNOSIS — M899 Disorder of bone, unspecified: Secondary | ICD-10-CM | POA: Diagnosis not present

## 2016-05-27 DIAGNOSIS — H401232 Low-tension glaucoma, bilateral, moderate stage: Secondary | ICD-10-CM | POA: Diagnosis not present

## 2016-05-30 DIAGNOSIS — Z01818 Encounter for other preprocedural examination: Secondary | ICD-10-CM | POA: Diagnosis not present

## 2016-05-30 DIAGNOSIS — Z23 Encounter for immunization: Secondary | ICD-10-CM | POA: Diagnosis not present

## 2016-05-30 DIAGNOSIS — I120 Hypertensive chronic kidney disease with stage 5 chronic kidney disease or end stage renal disease: Secondary | ICD-10-CM | POA: Diagnosis not present

## 2016-05-30 DIAGNOSIS — N185 Chronic kidney disease, stage 5: Secondary | ICD-10-CM | POA: Diagnosis not present

## 2016-05-30 DIAGNOSIS — E1122 Type 2 diabetes mellitus with diabetic chronic kidney disease: Secondary | ICD-10-CM | POA: Diagnosis not present

## 2016-05-30 DIAGNOSIS — R69 Illness, unspecified: Secondary | ICD-10-CM | POA: Diagnosis not present

## 2016-06-01 ENCOUNTER — Other Ambulatory Visit (HOSPITAL_BASED_OUTPATIENT_CLINIC_OR_DEPARTMENT_OTHER): Payer: Medicare HMO

## 2016-06-01 ENCOUNTER — Ambulatory Visit: Payer: Medicare HMO

## 2016-06-01 DIAGNOSIS — D631 Anemia in chronic kidney disease: Secondary | ICD-10-CM

## 2016-06-01 DIAGNOSIS — N189 Chronic kidney disease, unspecified: Secondary | ICD-10-CM | POA: Diagnosis not present

## 2016-06-01 LAB — CBC WITH DIFFERENTIAL/PLATELET
BASO%: 0.5 % (ref 0.0–2.0)
Basophils Absolute: 0 10*3/uL (ref 0.0–0.1)
EOS ABS: 0.2 10*3/uL (ref 0.0–0.5)
EOS%: 3.7 % (ref 0.0–7.0)
HCT: 33.3 % — ABNORMAL LOW (ref 34.8–46.6)
HGB: 11.1 g/dL — ABNORMAL LOW (ref 11.6–15.9)
LYMPH%: 27.8 % (ref 14.0–49.7)
MCH: 33.6 pg (ref 25.1–34.0)
MCHC: 33.3 g/dL (ref 31.5–36.0)
MCV: 100.9 fL (ref 79.5–101.0)
MONO#: 1.1 10*3/uL — AB (ref 0.1–0.9)
MONO%: 19.4 % — AB (ref 0.0–14.0)
NEUT%: 48.6 % (ref 38.4–76.8)
NEUTROS ABS: 2.9 10*3/uL (ref 1.5–6.5)
NRBC: 0 % (ref 0–0)
PLATELETS: 90 10*3/uL — AB (ref 145–400)
RBC: 3.3 10*6/uL — AB (ref 3.70–5.45)
RDW: 13.5 % (ref 11.2–14.5)
WBC: 5.9 10*3/uL (ref 3.9–10.3)
lymph#: 1.6 10*3/uL (ref 0.9–3.3)

## 2016-06-01 NOTE — Progress Notes (Signed)
Hgb today is 11.1, no injection needed per protocol. Pt given copy of labs and schedule. Instructed to follow schedule and call office if issues occur.

## 2016-06-20 DIAGNOSIS — N185 Chronic kidney disease, stage 5: Secondary | ICD-10-CM | POA: Diagnosis not present

## 2016-06-22 ENCOUNTER — Ambulatory Visit (HOSPITAL_BASED_OUTPATIENT_CLINIC_OR_DEPARTMENT_OTHER): Payer: Medicare HMO

## 2016-06-22 ENCOUNTER — Other Ambulatory Visit (HOSPITAL_BASED_OUTPATIENT_CLINIC_OR_DEPARTMENT_OTHER): Payer: Medicare HMO

## 2016-06-22 VITALS — BP 150/60 | HR 61 | Temp 97.7°F | Resp 20

## 2016-06-22 DIAGNOSIS — N184 Chronic kidney disease, stage 4 (severe): Secondary | ICD-10-CM

## 2016-06-22 DIAGNOSIS — D500 Iron deficiency anemia secondary to blood loss (chronic): Secondary | ICD-10-CM

## 2016-06-22 DIAGNOSIS — D696 Thrombocytopenia, unspecified: Secondary | ICD-10-CM

## 2016-06-22 DIAGNOSIS — D631 Anemia in chronic kidney disease: Secondary | ICD-10-CM | POA: Diagnosis not present

## 2016-06-22 DIAGNOSIS — N189 Chronic kidney disease, unspecified: Secondary | ICD-10-CM | POA: Diagnosis not present

## 2016-06-22 DIAGNOSIS — D509 Iron deficiency anemia, unspecified: Secondary | ICD-10-CM

## 2016-06-22 LAB — COMPREHENSIVE METABOLIC PANEL
ALBUMIN: 3.5 g/dL (ref 3.5–5.0)
ALK PHOS: 68 U/L (ref 40–150)
ALT: 14 U/L (ref 0–55)
ANION GAP: 10 meq/L (ref 3–11)
AST: 19 U/L (ref 5–34)
BUN: 44 mg/dL — AB (ref 7.0–26.0)
CALCIUM: 9.2 mg/dL (ref 8.4–10.4)
CO2: 20 mEq/L — ABNORMAL LOW (ref 22–29)
CREATININE: 3.3 mg/dL — AB (ref 0.6–1.1)
Chloride: 103 mEq/L (ref 98–109)
EGFR: 13 mL/min/{1.73_m2} — ABNORMAL LOW (ref >90)
Glucose: 158 mg/dl — ABNORMAL HIGH (ref 70–140)
Potassium: 4.7 mEq/L (ref 3.5–5.1)
Sodium: 133 mEq/L — ABNORMAL LOW (ref 136–145)
Total Bilirubin: 0.32 mg/dL (ref 0.20–1.20)
Total Protein: 6.7 g/dL (ref 6.4–8.3)

## 2016-06-22 LAB — CBC WITH DIFFERENTIAL/PLATELET
BASO%: 0.7 % (ref 0.0–2.0)
BASOS ABS: 0 10*3/uL (ref 0.0–0.1)
EOS%: 3.9 % (ref 0.0–7.0)
Eosinophils Absolute: 0.2 10*3/uL (ref 0.0–0.5)
HEMATOCRIT: 29 % — AB (ref 34.8–46.6)
HEMOGLOBIN: 9.6 g/dL — AB (ref 11.6–15.9)
LYMPH#: 1 10*3/uL (ref 0.9–3.3)
LYMPH%: 24.2 % (ref 14.0–49.7)
MCH: 33.3 pg (ref 25.1–34.0)
MCHC: 33.1 g/dL (ref 31.5–36.0)
MCV: 100.8 fL (ref 79.5–101.0)
MONO#: 0.5 10*3/uL (ref 0.1–0.9)
MONO%: 10.9 % (ref 0.0–14.0)
NEUT#: 2.5 10*3/uL (ref 1.5–6.5)
NEUT%: 60.3 % (ref 38.4–76.8)
PLATELETS: 99 10*3/uL — AB (ref 145–400)
RBC: 2.88 10*6/uL — ABNORMAL LOW (ref 3.70–5.45)
RDW: 12.9 % (ref 11.2–14.5)
WBC: 4.2 10*3/uL (ref 3.9–10.3)

## 2016-06-22 LAB — IRON AND TIBC
%SAT: 38 % (ref 21–57)
IRON: 107 ug/dL (ref 41–142)
TIBC: 281 ug/dL (ref 236–444)
UIBC: 175 ug/dL (ref 120–384)

## 2016-06-22 LAB — FERRITIN: FERRITIN: 71 ng/mL (ref 9–269)

## 2016-06-22 MED ORDER — DARBEPOETIN ALFA 300 MCG/0.6ML IJ SOSY
300.0000 ug | PREFILLED_SYRINGE | Freq: Once | INTRAMUSCULAR | Status: AC
  Administered 2016-06-22: 300 ug via SUBCUTANEOUS
  Filled 2016-06-22: qty 0.6

## 2016-06-22 NOTE — Patient Instructions (Signed)
Darbepoetin Alfa injection What is this medicine? DARBEPOETIN ALFA (dar be POE e tin AL fa) helps your body make more red blood cells. It is used to treat anemia caused by chronic kidney failure and chemotherapy. This medicine may be used for other purposes; ask your health care provider or pharmacist if you have questions. COMMON BRAND NAME(S): Aranesp What should I tell my health care provider before I take this medicine? They need to know if you have any of these conditions: -blood clotting disorders or history of blood clots -cancer patient not on chemotherapy -cystic fibrosis -heart disease, such as angina, heart failure, or a history of a heart attack -hemoglobin level of 12 g/dL or greater -high blood pressure -low levels of folate, iron, or vitamin B12 -seizures -an unusual or allergic reaction to darbepoetin, erythropoietin, albumin, hamster proteins, latex, other medicines, foods, dyes, or preservatives -pregnant or trying to get pregnant -breast-feeding How should I use this medicine? This medicine is for injection into a vein or under the skin. It is usually given by a health care professional in a hospital or clinic setting. If you get this medicine at home, you will be taught how to prepare and give this medicine. Do not shake the solution before you withdraw a dose. Use exactly as directed. Take your medicine at regular intervals. Do not take your medicine more often than directed. It is important that you put your used needles and syringes in a special sharps container. Do not put them in a trash can. If you do not have a sharps container, call your pharmacist or healthcare provider to get one. Talk to your pediatrician regarding the use of this medicine in children. While this medicine may be used in children as young as 1 year for selected conditions, precautions do apply. Overdosage: If you think you have taken too much of this medicine contact a poison control center or  emergency room at once. NOTE: This medicine is only for you. Do not share this medicine with others. What if I miss a dose? If you miss a dose, take it as soon as you can. If it is almost time for your next dose, take only that dose. Do not take double or extra doses. What may interact with this medicine? Do not take this medicine with any of the following medications: -epoetin alfa This list may not describe all possible interactions. Give your health care provider a list of all the medicines, herbs, non-prescription drugs, or dietary supplements you use. Also tell them if you smoke, drink alcohol, or use illegal drugs. Some items may interact with your medicine. What should I watch for while using this medicine? Visit your prescriber or health care professional for regular checks on your progress and for the needed blood tests and blood pressure measurements. It is especially important for the doctor to make sure your hemoglobin level is in the desired range, to limit the risk of potential side effects and to give you the best benefit. Keep all appointments for any recommended tests. Check your blood pressure as directed. Ask your doctor what your blood pressure should be and when you should contact him or her. As your body makes more red blood cells, you may need to take iron, folic acid, or vitamin B supplements. Ask your doctor or health care provider which products are right for you. If you have kidney disease continue dietary restrictions, even though this medication can make you feel better. Talk with your doctor or health   care professional about the foods you eat and the vitamins that you take. What side effects may I notice from receiving this medicine? Side effects that you should report to your doctor or health care professional as soon as possible: -allergic reactions like skin rash, itching or hives, swelling of the face, lips, or tongue -breathing problems -changes in vision -chest  pain -confusion, trouble speaking or understanding -feeling faint or lightheaded, falls -high blood pressure -muscle aches or pains -pain, swelling, warmth in the leg -rapid weight gain -severe headaches -sudden numbness or weakness of the face, arm or leg -trouble walking, dizziness, loss of balance or coordination -seizures (convulsions) -swelling of the ankles, feet, hands -unusually weak or tired Side effects that usually do not require medical attention (report to your doctor or health care professional if they continue or are bothersome): -diarrhea -fever, chills (flu-like symptoms) -headaches -nausea, vomiting -redness, stinging, or swelling at site where injected This list may not describe all possible side effects. Call your doctor for medical advice about side effects. You may report side effects to FDA at 1-800-FDA-1088. Where should I keep my medicine? Keep out of the reach of children. Store in a refrigerator between 2 and 8 degrees C (36 and 46 degrees F). Do not freeze. Do not shake. Throw away any unused portion if using a single-dose vial. Throw away any unused medicine after the expiration date. NOTE: This sheet is a summary. It may not cover all possible information. If you have questions about this medicine, talk to your doctor, pharmacist, or health care provider.  2015, [employer]. (2008-07-15 10:23:57)  

## 2016-06-24 DIAGNOSIS — D631 Anemia in chronic kidney disease: Secondary | ICD-10-CM | POA: Diagnosis not present

## 2016-06-24 DIAGNOSIS — I120 Hypertensive chronic kidney disease with stage 5 chronic kidney disease or end stage renal disease: Secondary | ICD-10-CM | POA: Diagnosis not present

## 2016-06-24 DIAGNOSIS — M899 Disorder of bone, unspecified: Secondary | ICD-10-CM | POA: Diagnosis not present

## 2016-06-24 DIAGNOSIS — E871 Hypo-osmolality and hyponatremia: Secondary | ICD-10-CM | POA: Diagnosis not present

## 2016-06-24 DIAGNOSIS — E1122 Type 2 diabetes mellitus with diabetic chronic kidney disease: Secondary | ICD-10-CM | POA: Diagnosis not present

## 2016-06-24 DIAGNOSIS — N185 Chronic kidney disease, stage 5: Secondary | ICD-10-CM | POA: Diagnosis not present

## 2016-07-04 NOTE — Progress Notes (Signed)
CARDIOLOGY OFFICE NOTE  Date:  07/05/2016    [name] Date of Birth: 1940-12-23 Medical Record #[id number]  PCP:  [name], MD  Cardiologist:  Annabell Howells    Chief Complaint  Patient presents with  . Coronary Artery Disease    3 month check - seen for Dr. Aundra Dubin    History of Present Illness: [name] is a 75 y.o. female who presents today for a 3 month check. Seen for Dr. Aundra Dubin.   She has a h/o CAD s/p CABG in 6734 and diastolic CHF. She has chronic anemia, balance issues, HTN, DM, HLD, CKD, chronic back pain, anxiety and known valvular heart disease.   Last seen here a little over a year ago - balance still an issue. She is followed by Nephrology in [place] - has seen Dr. Kellie Simmering for AV fistula consult. Kidneys were failing and she was nearing dialysis.   Seen back in July - feeling ok. EKG with more changes. Updated her Myoview - see below. Brought back for discussion - opted to avoid cardiac catheterization and continue with medical management.   Comes back today. Here with her husband. She is chronically fatigued. This is unchanged. No chest pain. Breathing is at her baseline. She notes that her kidney function improved a little - so far - not on dialysis. Continues with close follow up with nephrology.  She seems to be holding her own and she is happy with how she is doing.   Past Medical History:  1. CAD: Pt presented 2/10 to [facility] with NSTEMI and diastolic CHF exacerbation. LHC was done 3/10 showing 99% pRCA stenosis and 80% calcified pLAD stenosis with L=>R collaterals. Pt was referred for CABG which was done by Dr. Prescott Gum with LIMA-LAD, SVG-RCA, SVG-OM.  2. Diastolic CHF: Echo (1/93) showed EF 55-65%, mild LVH, diastolic dysfunction, mild AS with mean gradient 12 mmHg, PASP 43 mmHg. Echo (2/12): EF 55-60%, mild LVH, mild AS (mean gradient 12), PA systolic pressure 32 mmHg. Echo (5/15) with EF 55%, mild MR, mild aortic stenosis.  3.  CKD 4. DM2  5. HTN  6. Hyperlipidemia: nausea/GI discomfort with atorvastatin, ? Balance problems with pravastatin.  7. Anxiety  8. Chronic low back pain  9. History of thrombocytopenia  10. 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 Followed by hematology, getting Aranesp injections 11. Aortic stenosis: mean gradient 12 mmHg in 2/12. Mean gradient 18 mmHg with AVA 1.6 cm^2 in 5/15.  12. Carotid stenosis: 40-59% bilateral ICA stenosis in 7/90. 24-09% LICA stenosis in 7/35. Carotids (3/29) with 92-42% LICA stenosis.  13. PAD: ABIs 3/12 were normal but TBIs were mildly decreased. ABIs/TBIs (10/13) normal.    Past Medical History:  Diagnosis Date  . Anemia    Pt is taking iron.   [name] Anxiety   . Arthritis   . Carotid stenosis    40-59% bilateral ICA stenosis in 2/12.  . Chronic low back pain   . CKD (chronic kidney disease)    Dr. Audie Clear at [facility]  . Coronary artery disease    Pt presented 2/10 to [facility] with NSTEMI and diastolic CHF exacerbation.  LHC was done  3/10 showing 99% pRCA stenosis and 80% calcified pLAD stenosis with L=>R collaterals.  Pt was referred  for CABG which was done by Dr. Prescott Gum with LIMA-LAD, SVG-RCA, SVG-OM.  . Diabetes mellitus   . Diabetic neuropathy ([place])   . Diastolic CHF ([place])  Echo (2/10) showed EF 55-65%, mild LVH, diastolic dysfunction, mild AS with mean gradient 12 mmHg, PASP 43 mmHg.  Echo (2/12): EF 55-60%, mild LVH, mild AS (mean gradient 12), PA systolic pressure 32 mmHg.     [name] GERD (gastroesophageal reflux disease)   . Heart murmur   . Hyperlipidemia   . Hypertension   . Mild aortic stenosis    mean gradient 12 mmHg in 2/12.  . Myocardial infarction    "mild"  . Pneumonia   . PONV (postoperative nausea and vomiting)   . Thrombocytopenia ([place])   . Unsteady gait     Past Surgical History:  Procedure Laterality Date  . AV FISTULA PLACEMENT Left 01/30/2015   Procedure: Creation of a Radial  Cephalic AV Fistula left wrist;  Surgeon: [name], MD;  Location: [place];  Service: Vascular;  Laterality: Left;  . BACK SURGERY     multiple  . BREAST SURGERY     biopsy  . CARDIAC CATHETERIZATION    . CATARACT EXTRACTION W/ INTRAOCULAR LENS  IMPLANT, BILATERAL    . COLONOSCOPY W/ BIOPSIES AND POLYPECTOMY    . CORONARY ARTERY BYPASS GRAFT  09/2008   pt with NSTEMI and diastolic CHF exacerbation.  LHC was done  3/10 showing 99% pRCA stenosis and 80% calcified pLAD stenosis with L=>R collaterals.  Pt was referred  for CABG which was done by Dr. Prescott Gum with LIMA-LAD, SVG-RCA, SVG-OM.  [name] REVISON OF ARTERIOVENOUS FISTULA Left 07/02/2015   Procedure: REVISON OF LEFT RADIOCEPHALIC ARTERIOVENOUS FISTULA;  Surgeon: [name], MD;  Location: [facility];  Service: Vascular;  Laterality: Left;     Medications: Current Outpatient Prescriptions  Medication Sig Dispense Refill  . ALPRAZolam (XANAX) 0.5 MG tablet Take 0.5 mg by mouth at bedtime.     [name] amLODipine (NORVASC) 5 MG tablet Take 1 tablet (5 mg total) by mouth daily. 90 tablet 3  . aspirin 81 MG tablet Take 81 mg by mouth daily.     . calcium acetate (PHOSLO) 667 MG capsule Take 1,334 mg by mouth 3 (three) times daily with meals. As directed    . carvedilol (COREG) 12.5 MG tablet TAKE 1 TABLET(12.5 MG) BY MOUTH TWICE DAILY 60 tablet 9  . cholecalciferol (VITAMIN D) 1000 UNITS tablet Take 1,000 Units by mouth daily.    . furosemide (LASIX) 20 MG tablet Take 20 mg by mouth daily. As directed    . losartan (COZAAR) 25 MG tablet Take 1 tablet (25 mg total) by mouth daily. 90 tablet 3  . rosuvastatin (CRESTOR) 5 MG tablet TAKE 1 TABLET(5 MG) BY MOUTH DAILY 90 tablet 3  . sodium bicarbonate 650 MG tablet Take 650 mg by mouth 2 (two) times daily.    [name] trimethoprim (TRIMPEX) 100 MG tablet Take 50 mg by mouth daily.     . VELTASSA 8.4 G packet Take 8.4 g by mouth daily after lunch.      No current facility-administered medications for this  visit.     Allergies: Allergies  Allergen Reactions  . Lipitor [Atorvastatin] Other (See Comments)    Stomach pain  . Strawberry Extract Rash    Social History: The patient  reports that she has quit smoking. Her smoking use included Cigarettes. She has never used smokeless tobacco. She reports that she drinks alcohol. She reports that she does not use drugs.   Family History: The patient's She was adopted. Family history is unknown by patient.   Review of Systems:  Please see the history of present illness.   Otherwise, the review of systems is positive for none.   All other systems are reviewed and negative.   Physical Exam: VS:  BP 130/60   Pulse (!) 56   Ht 5\' 8"  (1.727 m)   Wt 136 lb 6.4 oz (61.9 kg)   BMI 20.74 kg/m  .  BMI Body mass index is 20.74 kg/m.  Wt Readings from Last 3 Encounters:  07/05/16 136 lb 6.4 oz (61.9 kg)  04/20/16 134 lb 12.8 oz (61.1 kg)  04/01/16 132 lb 12.8 oz (60.2 kg)    General: Pleasant. She looks chronically ill but is alert and in no acute distress.   HEENT: Normal.  Neck: Supple, no JVD, carotid bruits, or masses noted.  Cardiac: Regular rate and rhythm. Harsh outflow murmur. No edema.  Respiratory:  Lungs are clear to auscultation bilaterally with normal work of breathing.  GI: Soft and nontender.  MS: No deformity or atrophy. Gait and ROM intact.  Skin: Warm and dry. Color is normal.  Neuro:  Strength and sensation are intact and no gross focal deficits noted.  Psych: Alert, appropriate and with normal affect.   LABORATORY DATA:  EKG:  EKG is not ordered today.  Lab Results  Component Value Date   WBC 4.2 06/22/2016   HGB 9.6 (L) 06/22/2016   HCT 29.0 (L) 06/22/2016   PLT 99 (L) 06/22/2016   GLUCOSE 158 (H) 06/22/2016   CHOL 123 (L) 02/23/2016   TRIG 62 02/23/2016   HDL 75 02/23/2016   LDLDIRECT 34.4 11/17/2008   LDLCALC 36 02/23/2016   ALT 14 06/22/2016   AST 19 06/22/2016   NA 133 (L) 06/22/2016   K 4.7  06/22/2016   CL 105 12/20/2013   CREATININE 3.3 (HH) 06/22/2016   BUN 44.0 (H) 06/22/2016   CO2 20 (L) 06/22/2016   TSH 2.185 01/17/2014   INR 1.4 10/16/2008   HGBA1C (H) 10/14/2008    6.5 (NOTE)   The ADA recommends the following therapeutic goal for glycemic   control related to Hgb A1C measurement:   Goal of Therapy:   < 7.0% Hgb A1C   Reference: American Diabetes Association: Clinical Practice   Recommendations 2008, Diabetes Care,  2008, 31:(Suppl 1).    BNP (last 3 results) No results for input(s): BNP in the last 8760 hours.  ProBNP (last 3 results) No results for input(s): PROBNP in the last 8760 hours.   Other Studies Reviewed Today:  Myoview Study Highlights 02/2016    Nuclear stress EF: 54%. There is mid inferior wall hypokinesis  There was no ST segment deviation noted during stress.  Defect 1: There is a medium defect of moderate severity present in the basal inferior and mid inferior location.  The mid inferior wall perfusion defect is reversible consistent with ischemia. The basal inferior wall defect is mostly fixed consistent with infarct.  This is an intermediate risk study. Consider occlusion of SVG to RCA graft.  Candee Furbish, MD    Echo Study Conclusions from 02/2016  - Left ventricle: The cavity size was normal. Wall thickness was increased in a pattern of mild LVH. Systolic function was normal. The estimated ejection fraction was in the range of 55% to 60%. Wall motion was normal; there were no regional wall motion abnormalities. Doppler parameters are consistent with abnormal left ventricular relaxation (grade 1 diastolic dysfunction). - Aortic valve: Severely calcified leaflets. The left and right coronary cusps appear fused, functionally bicuspid.  Mild aortic stenosis by mean gradient, visually looks moderate. Mean gradient (S): 11 mm Hg. - Mitral valve: Mildly to moderately calcified annulus. Mildly calcified leaflets  . There was trivial regurgitation. - Left atrium: The atrium was mildly dilated. - Right ventricle: The cavity size was normal. Systolic function was mildly reduced. - Tricuspid valve: Peak RV-RA gradient (S): 31 mm Hg. - Pulmonary arteries: PA peak pressure: 34 mm Hg (S). - Inferior vena cava: The vessel was normal in size. The respirophasic diameter changes were in the normal range (>= 50%), consistent with normal central venous pressure.  Impressions:  - Normal LV size with mild LV hypertrophy. EF 55-60%. Normal RV size with mildly reduced systolic function. Functionally bicuspid aortic valve, mild stenosis by mean gradient but moderate stenosis visually.  Assessment/Plan:  1. CAD - Status post CABG from 2010. Has had prior intermediate Myoview - reviewed previously with Dr. Aundra Dubin. No active chest pain. Will continue with her current medical regimen for now. At some point she will be on dialysis and then we may need to proceed with left and right heart cath. She would like to continue to hold off for now as well. Continue with aspirin, statin and beta blocker  2. AORTIC STENOSIS  No symptoms - recent echo noted - mild by gradient but moderate by visual inspection. No cardinal symptoms.   3. CAROTID ARTERY STENOSIS - needs her dopplers repeated in June of 2018.   4. HYPERTENSION - BP great on current regimen. No change with her current regimen.   5. Hyperlipidemia - on statin therapy - refilled Crestor today.   6. 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   7. Progressive CKD - followed by Renal in [place]  8. Anemia- I suspect this is from her kidney disease.  Current medicines are reviewed with the patient today.  The patient does not have concerns regarding medicines other than what has been noted above.  The following changes have been made:  See above.  Labs/ tests ordered today include:   No orders of the defined  types were placed in this encounter.    Disposition:   FU with me in 4 months with fasting labs.    Patient is agreeable to this plan and will call if any problems develop in the interim.   Signed: [name], RN, ANP-C 07/05/2016 9:22 AM  [place] [address] [place], [place]  [address] Phone: [phone] Fax: [phone]

## 2016-07-05 ENCOUNTER — Ambulatory Visit (INDEPENDENT_AMBULATORY_CARE_PROVIDER_SITE_OTHER): Payer: Medicare HMO | Admitting: Nurse Practitioner

## 2016-07-05 ENCOUNTER — Encounter: Payer: Self-pay | Admitting: Nurse Practitioner

## 2016-07-05 VITALS — BP 130/60 | HR 56 | Ht 68.0 in | Wt 136.4 lb

## 2016-07-05 DIAGNOSIS — E7800 Pure hypercholesterolemia, unspecified: Secondary | ICD-10-CM | POA: Diagnosis not present

## 2016-07-05 DIAGNOSIS — I10 Essential (primary) hypertension: Secondary | ICD-10-CM | POA: Diagnosis not present

## 2016-07-05 DIAGNOSIS — N186 End stage renal disease: Secondary | ICD-10-CM

## 2016-07-05 DIAGNOSIS — I259 Chronic ischemic heart disease, unspecified: Secondary | ICD-10-CM

## 2016-07-05 MED ORDER — ROSUVASTATIN CALCIUM 5 MG PO TABS: ORAL_TABLET | ORAL | 3 refills | Status: DC

## 2016-07-05 NOTE — Patient Instructions (Addendum)
We will be checking the following labs today - NONE   Medication Instructions:    Continue with your current medicines.   I sent in your refill for the Crestor today    Testing/Procedures To Be Arranged:  N/A  Follow-Up:   See me in 4 months with fasting labs    Other Special Instructions:   N/A    If you need a refill on your cardiac medications before your next appointment, please call your pharmacy.   Call the [place] office at [phone] if you have any questions, problems or concerns.

## 2016-07-13 ENCOUNTER — Ambulatory Visit (HOSPITAL_BASED_OUTPATIENT_CLINIC_OR_DEPARTMENT_OTHER): Payer: Medicare HMO

## 2016-07-13 ENCOUNTER — Ambulatory Visit (HOSPITAL_BASED_OUTPATIENT_CLINIC_OR_DEPARTMENT_OTHER): Payer: Medicare HMO | Admitting: Oncology

## 2016-07-13 ENCOUNTER — Telehealth: Payer: Self-pay | Admitting: Oncology

## 2016-07-13 ENCOUNTER — Other Ambulatory Visit (HOSPITAL_BASED_OUTPATIENT_CLINIC_OR_DEPARTMENT_OTHER): Payer: Medicare HMO

## 2016-07-13 VITALS — BP 149/51 | HR 59 | Temp 97.7°F | Resp 18 | Ht 68.0 in | Wt 133.2 lb

## 2016-07-13 VITALS — BP 149/51

## 2016-07-13 DIAGNOSIS — N189 Chronic kidney disease, unspecified: Secondary | ICD-10-CM

## 2016-07-13 DIAGNOSIS — D509 Iron deficiency anemia, unspecified: Secondary | ICD-10-CM

## 2016-07-13 DIAGNOSIS — D696 Thrombocytopenia, unspecified: Secondary | ICD-10-CM

## 2016-07-13 DIAGNOSIS — D631 Anemia in chronic kidney disease: Secondary | ICD-10-CM

## 2016-07-13 DIAGNOSIS — N184 Chronic kidney disease, stage 4 (severe): Secondary | ICD-10-CM

## 2016-07-13 LAB — CBC WITH DIFFERENTIAL/PLATELET
BASO%: 0.5 % (ref 0.0–2.0)
BASOS ABS: 0 10*3/uL (ref 0.0–0.1)
EOS%: 3.4 % (ref 0.0–7.0)
Eosinophils Absolute: 0.1 10*3/uL (ref 0.0–0.5)
HEMATOCRIT: 30 % — AB (ref 34.8–46.6)
HGB: 9.9 g/dL — ABNORMAL LOW (ref 11.6–15.9)
LYMPH%: 31.5 % (ref 14.0–49.7)
MCH: 33.6 pg (ref 25.1–34.0)
MCHC: 33 g/dL (ref 31.5–36.0)
MCV: 101.7 fL — AB (ref 79.5–101.0)
MONO#: 0.5 10*3/uL (ref 0.1–0.9)
MONO%: 12.9 % (ref 0.0–14.0)
NEUT#: 2.1 10*3/uL (ref 1.5–6.5)
NEUT%: 51.7 % (ref 38.4–76.8)
PLATELETS: 81 10*3/uL — AB (ref 145–400)
RBC: 2.95 10*6/uL — ABNORMAL LOW (ref 3.70–5.45)
RDW: 13.9 % (ref 11.2–14.5)
WBC: 4.1 10*3/uL (ref 3.9–10.3)
lymph#: 1.3 10*3/uL (ref 0.9–3.3)
nRBC: 0 % (ref 0–0)

## 2016-07-13 MED ORDER — DARBEPOETIN ALFA 300 MCG/0.6ML IJ SOSY
300.0000 ug | PREFILLED_SYRINGE | Freq: Once | INTRAMUSCULAR | Status: AC
  Administered 2016-07-13: 300 ug via SUBCUTANEOUS
  Filled 2016-07-13: qty 0.6

## 2016-07-13 NOTE — Telephone Encounter (Signed)
Gave patient avs report and appointments for December thru February.  °

## 2016-07-13 NOTE — Patient Instructions (Signed)
Darbepoetin Alfa injection What is this medicine? DARBEPOETIN ALFA (dar be POE e tin AL fa) helps your body make more red blood cells. It is used to treat anemia caused by chronic kidney failure and chemotherapy. This medicine may be used for other purposes; ask your health care provider or pharmacist if you have questions. COMMON BRAND NAME(S): Aranesp What should I tell my health care provider before I take this medicine? They need to know if you have any of these conditions: -blood clotting disorders or history of blood clots -cancer patient not on chemotherapy -cystic fibrosis -heart disease, such as angina, heart failure, or a history of a heart attack -hemoglobin level of 12 g/dL or greater -high blood pressure -low levels of folate, iron, or vitamin B12 -seizures -an unusual or allergic reaction to darbepoetin, erythropoietin, albumin, hamster proteins, latex, other medicines, foods, dyes, or preservatives -pregnant or trying to get pregnant -breast-feeding How should I use this medicine? This medicine is for injection into a vein or under the skin. It is usually given by a health care professional in a hospital or clinic setting. If you get this medicine at home, you will be taught how to prepare and give this medicine. Do not shake the solution before you withdraw a dose. Use exactly as directed. Take your medicine at regular intervals. Do not take your medicine more often than directed. It is important that you put your used needles and syringes in a special sharps container. Do not put them in a trash can. If you do not have a sharps container, call your pharmacist or healthcare provider to get one. Talk to your pediatrician regarding the use of this medicine in children. While this medicine may be used in children as young as 1 year for selected conditions, precautions do apply. Overdosage: If you think you have taken too much of this medicine contact a poison control center or  emergency room at once. NOTE: This medicine is only for you. Do not share this medicine with others. What if I miss a dose? If you miss a dose, take it as soon as you can. If it is almost time for your next dose, take only that dose. Do not take double or extra doses. What may interact with this medicine? Do not take this medicine with any of the following medications: -epoetin alfa This list may not describe all possible interactions. Give your health care provider a list of all the medicines, herbs, non-prescription drugs, or dietary supplements you use. Also tell them if you smoke, drink alcohol, or use illegal drugs. Some items may interact with your medicine. What should I watch for while using this medicine? Visit your prescriber or health care professional for regular checks on your progress and for the needed blood tests and blood pressure measurements. It is especially important for the doctor to make sure your hemoglobin level is in the desired range, to limit the risk of potential side effects and to give you the best benefit. Keep all appointments for any recommended tests. Check your blood pressure as directed. Ask your doctor what your blood pressure should be and when you should contact him or her. As your body makes more red blood cells, you may need to take iron, folic acid, or vitamin B supplements. Ask your doctor or health care provider which products are right for you. If you have kidney disease continue dietary restrictions, even though this medication can make you feel better. Talk with your doctor or health   care professional about the foods you eat and the vitamins that you take. What side effects may I notice from receiving this medicine? Side effects that you should report to your doctor or health care professional as soon as possible: -allergic reactions like skin rash, itching or hives, swelling of the face, lips, or tongue -breathing problems -changes in vision -chest  pain -confusion, trouble speaking or understanding -feeling faint or lightheaded, falls -high blood pressure -muscle aches or pains -pain, swelling, warmth in the leg -rapid weight gain -severe headaches -sudden numbness or weakness of the face, arm or leg -trouble walking, dizziness, loss of balance or coordination -seizures (convulsions) -swelling of the ankles, feet, hands -unusually weak or tired Side effects that usually do not require medical attention (report to your doctor or health care professional if they continue or are bothersome): -diarrhea -fever, chills (flu-like symptoms) -headaches -nausea, vomiting -redness, stinging, or swelling at site where injected This list may not describe all possible side effects. Call your doctor for medical advice about side effects. You may report side effects to FDA at 1-800-FDA-1088. Where should I keep my medicine? Keep out of the reach of children. Store in a refrigerator between 2 and 8 degrees C (36 and 46 degrees F). Do not freeze. Do not shake. Throw away any unused portion if using a single-dose vial. Throw away any unused medicine after the expiration date. NOTE: This sheet is a summary. It may not cover all possible information. If you have questions about this medicine, talk to your doctor, pharmacist, or health care provider.  2015, [employer]. (2008-07-15 10:23:57)  

## 2016-07-13 NOTE — Progress Notes (Signed)
Hematology and Oncology Follow Up Visit  [name] [id number] 06/22/1941 75 y.o. 07/13/2016 9:59 AM [name], MDWilson, Jeanne Flavors, MD   Principle Diagnosis: 75 year old female with anemia of renal disease diagnosed in 2011. She has chronic renal insufficiency but has not required hemodialysis.  Current therapy: Aranesp 300 mcg subcutaneously every 3 weeks keeping hemoglobin above 10.   Interim History:  [name] presents today for a followup visit with her husband. Since the last visit, she continues to do reasonably well. She remains frail but mobile without any recent falls or syncope. She continues to receive Aranesp periodically to keep her hemoglobin above 10. She reports that Aranesp helps with her symptoms and ability to function better. She reports she has not required hemodialysis unit but certainly approaching that stage.  She has not reported any headaches or blurry vision or double vision. She does not report any chest pain shortness of breath or difficulty breathing. She does not report any nausea or vomiting or abdominal pain. She does not report any genitourinary complaints. She has not reported any bleeding or clotting tendencies. She has not reported any lymphadenopathy or petechiae. Rest of her review of systems unremarkable.  Medications: I have reviewed the patient's current medications.  Current Outpatient Prescriptions  Medication Sig Dispense Refill  . ALPRAZolam (XANAX) 0.5 MG tablet Take 0.5 mg by mouth at bedtime.     [name] amLODipine (NORVASC) 5 MG tablet Take 1 tablet (5 mg total) by mouth daily. 90 tablet 3  . aspirin 81 MG tablet Take 81 mg by mouth daily.     . calcium acetate (PHOSLO) 667 MG capsule Take 1,334 mg by mouth 3 (three) times daily with meals. As directed    . carvedilol (COREG) 12.5 MG tablet TAKE 1 TABLET(12.5 MG) BY MOUTH TWICE DAILY 60 tablet 9  . cholecalciferol (VITAMIN D) 1000 UNITS tablet Take 1,000 Units by mouth daily.    .  furosemide (LASIX) 20 MG tablet Take 20 mg by mouth daily. As directed    . losartan (COZAAR) 25 MG tablet Take 1 tablet (25 mg total) by mouth daily. 90 tablet 3  . rosuvastatin (CRESTOR) 5 MG tablet TAKE 1 TABLET(5 MG) BY MOUTH DAILY 90 tablet 3  . sodium bicarbonate 650 MG tablet Take 650 mg by mouth 2 (two) times daily.    [name] trimethoprim (TRIMPEX) 100 MG tablet Take 50 mg by mouth daily.     . VELTASSA 8.4 G packet Take 8.4 g by mouth daily after lunch.      No current facility-administered medications for this visit.      Allergies:  Allergies  Allergen Reactions  . Lipitor [Atorvastatin] Other (See Comments)    Stomach pain  . Strawberry Extract Rash    Past Medical History, Surgical history, Social history, and Family History were reviewed and updated.    Physical Exam: Her vitals were personally reviewed today and showed a blood pressure of 149/51. Her weight 233 pounds 6 oxygen saturation 100%. ECOG: 1 General appearance: Chronically ill-appearing woman without distress. Head: Normocephalic, without obvious abnormality no oral ulcers or lesions. Neck: no adenopathy and no carotid bruit no thyroid masses. Lymph nodes: Cervical, supraclavicular, and axillary nodes normal. Heart:regular rate and rhythm, S1, S2 normal, no murmur, click, rub or gallop Lung:chest clear, no wheezing, rales, normal symmetric air entry.  Abdomen: soft, non-tender, without masses or organomegaly no splenomegaly. EXT:no erythema, induration, or nodules   Lab Results: Lab Results  Component Value Date  WBC 4.1 07/13/2016   HGB 9.9 (L) 07/13/2016   HCT 30.0 (L) 07/13/2016   MCV 101.7 (H) 07/13/2016   PLT 81 (L) 07/13/2016     Chemistry      Component Value Date/Time   NA 133 (L) 06/22/2016 0911   K 4.7 06/22/2016 0911   CL 105 12/20/2013 1103   CL 101 08/16/2012 0757   CO2 20 (L) 06/22/2016 0911   BUN 44.0 (H) 06/22/2016 0911   CREATININE 3.3 (HH) 06/22/2016 0911      Component  Value Date/Time   CALCIUM 9.2 06/22/2016 0911   ALKPHOS 68 06/22/2016 0911   AST 19 06/22/2016 0911   ALT 14 06/22/2016 0911   BILITOT 0.32 06/22/2016 0911     Results for [name], [name] (MRN [id number]) as of 07/13/2016 10:02  Ref. Range 06/22/2016 09:11  Iron Latest Ref Range: 41 - 142 ug/dL 107  UIBC Latest Ref Range: 120 - 384 ug/dL 175  TIBC Latest Ref Range: 236 - 444 ug/dL 281  %SAT Latest Ref Range: 21 - 57 % 38  Ferritin Latest Ref Range: 9 - 269 ng/ml 71     Impression and Plan:   75 year old female with the following issues:   1. Anemia of renal disease. She is currently on Aranesp 300 g every 3 weeks to keep her hemoglobin above 10.   Her iron studies obtained on 06/22/2016 showed no evidence of iron deficiency that required supplementation.  Her hemoglobin today is 9.9 how she will receive Aranesp injection.  2. Thrombocytopenia: Chronic in nature which have been fluctuating between 80-100 for last 10 years. She has no signs or symptoms of bleeding we'll continue to monitor. 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  3. Renal failure: Her creatinine clearance is close to 10 mL/m but have not received dialysis at this time.  4. Follow-up: CBC and Aranesp injection every 3 weeks. Follow-up with a clinical visit will be in February 2018.      [name] 11/29/20179:59 AM

## 2016-07-21 DIAGNOSIS — R69 Illness, unspecified: Secondary | ICD-10-CM | POA: Diagnosis not present

## 2016-07-27 DIAGNOSIS — Z1231 Encounter for screening mammogram for malignant neoplasm of breast: Secondary | ICD-10-CM | POA: Diagnosis not present

## 2016-08-01 DIAGNOSIS — N185 Chronic kidney disease, stage 5: Secondary | ICD-10-CM | POA: Diagnosis not present

## 2016-08-02 ENCOUNTER — Telehealth: Payer: Self-pay | Admitting: Oncology

## 2016-08-02 NOTE — Telephone Encounter (Signed)
MAILED RECORDS TO [facility]-RISK ADJUSTMENT °

## 2016-08-03 ENCOUNTER — Other Ambulatory Visit (HOSPITAL_BASED_OUTPATIENT_CLINIC_OR_DEPARTMENT_OTHER): Payer: Medicare HMO

## 2016-08-03 ENCOUNTER — Ambulatory Visit: Payer: Medicare HMO

## 2016-08-03 DIAGNOSIS — D631 Anemia in chronic kidney disease: Secondary | ICD-10-CM

## 2016-08-03 DIAGNOSIS — N189 Chronic kidney disease, unspecified: Secondary | ICD-10-CM

## 2016-08-03 DIAGNOSIS — D509 Iron deficiency anemia, unspecified: Secondary | ICD-10-CM

## 2016-08-03 DIAGNOSIS — D696 Thrombocytopenia, unspecified: Secondary | ICD-10-CM

## 2016-08-03 LAB — COMPREHENSIVE METABOLIC PANEL
ALK PHOS: 66 U/L (ref 40–150)
ALT: 15 U/L (ref 0–55)
AST: 19 U/L (ref 5–34)
Albumin: 3.8 g/dL (ref 3.5–5.0)
Anion Gap: 9 mEq/L (ref 3–11)
BILIRUBIN TOTAL: 0.43 mg/dL (ref 0.20–1.20)
BUN: 44.9 mg/dL — ABNORMAL HIGH (ref 7.0–26.0)
CHLORIDE: 107 meq/L (ref 98–109)
CO2: 20 meq/L — AB (ref 22–29)
CREATININE: 3.6 mg/dL — AB (ref 0.6–1.1)
Calcium: 9.3 mg/dL (ref 8.4–10.4)
EGFR: 12 mL/min/{1.73_m2} — ABNORMAL LOW (ref >90)
Glucose: 141 mg/dl — ABNORMAL HIGH (ref 70–140)
POTASSIUM: 5 meq/L (ref 3.5–5.1)
SODIUM: 136 meq/L (ref 136–145)
Total Protein: 7.1 g/dL (ref 6.4–8.3)

## 2016-08-03 LAB — CBC WITH DIFFERENTIAL/PLATELET
BASO%: 0.7 % (ref 0.0–2.0)
BASOS ABS: 0 10*3/uL (ref 0.0–0.1)
EOS%: 3.2 % (ref 0.0–7.0)
Eosinophils Absolute: 0.2 10*3/uL (ref 0.0–0.5)
HCT: 35.4 % (ref 34.8–46.6)
HGB: 11.2 g/dL — ABNORMAL LOW (ref 11.6–15.9)
LYMPH%: 18.4 % (ref 14.0–49.7)
MCH: 32.6 pg (ref 25.1–34.0)
MCHC: 31.6 g/dL (ref 31.5–36.0)
MCV: 103.2 fL — ABNORMAL HIGH (ref 79.5–101.0)
MONO#: 0.6 10*3/uL (ref 0.1–0.9)
MONO%: 11.2 % (ref 0.0–14.0)
NEUT#: 3.5 10*3/uL (ref 1.5–6.5)
NEUT%: 66.5 % (ref 38.4–76.8)
Platelets: 100 10*3/uL — ABNORMAL LOW (ref 145–400)
RBC: 3.43 10*6/uL — AB (ref 3.70–5.45)
RDW: 14.6 % — AB (ref 11.2–14.5)
WBC: 5.3 10*3/uL (ref 3.9–10.3)
lymph#: 1 10*3/uL (ref 0.9–3.3)

## 2016-08-03 LAB — IRON AND TIBC
%SAT: 33 % (ref 21–57)
IRON: 110 ug/dL (ref 41–142)
TIBC: 334 ug/dL (ref 236–444)
UIBC: 224 ug/dL (ref 120–384)

## 2016-08-03 LAB — FERRITIN: Ferritin: 22 ng/ml (ref 9–269)

## 2016-08-03 NOTE — Progress Notes (Signed)
Hgb= 11.3 today, hold Aranesp 300 mcg.

## 2016-08-05 DIAGNOSIS — I120 Hypertensive chronic kidney disease with stage 5 chronic kidney disease or end stage renal disease: Secondary | ICD-10-CM | POA: Diagnosis not present

## 2016-08-05 DIAGNOSIS — M899 Disorder of bone, unspecified: Secondary | ICD-10-CM | POA: Diagnosis not present

## 2016-08-05 DIAGNOSIS — D631 Anemia in chronic kidney disease: Secondary | ICD-10-CM | POA: Diagnosis not present

## 2016-08-05 DIAGNOSIS — E1122 Type 2 diabetes mellitus with diabetic chronic kidney disease: Secondary | ICD-10-CM | POA: Diagnosis not present

## 2016-08-05 DIAGNOSIS — N185 Chronic kidney disease, stage 5: Secondary | ICD-10-CM | POA: Diagnosis not present

## 2016-08-24 ENCOUNTER — Other Ambulatory Visit (HOSPITAL_BASED_OUTPATIENT_CLINIC_OR_DEPARTMENT_OTHER): Payer: Medicare HMO

## 2016-08-24 ENCOUNTER — Ambulatory Visit (HOSPITAL_BASED_OUTPATIENT_CLINIC_OR_DEPARTMENT_OTHER): Payer: Medicare HMO

## 2016-08-24 VITALS — BP 148/74 | HR 63 | Temp 97.2°F | Resp 18

## 2016-08-24 DIAGNOSIS — D509 Iron deficiency anemia, unspecified: Secondary | ICD-10-CM

## 2016-08-24 DIAGNOSIS — D696 Thrombocytopenia, unspecified: Secondary | ICD-10-CM

## 2016-08-24 DIAGNOSIS — N189 Chronic kidney disease, unspecified: Secondary | ICD-10-CM | POA: Diagnosis not present

## 2016-08-24 DIAGNOSIS — D631 Anemia in chronic kidney disease: Secondary | ICD-10-CM | POA: Diagnosis not present

## 2016-08-24 DIAGNOSIS — N184 Chronic kidney disease, stage 4 (severe): Secondary | ICD-10-CM

## 2016-08-24 LAB — CBC WITH DIFFERENTIAL/PLATELET
BASO%: 0.5 % (ref 0.0–2.0)
BASOS ABS: 0 10*3/uL (ref 0.0–0.1)
EOS ABS: 0.2 10*3/uL (ref 0.0–0.5)
EOS%: 3.9 % (ref 0.0–7.0)
HEMATOCRIT: 29.9 % — AB (ref 34.8–46.6)
HGB: 9.7 g/dL — ABNORMAL LOW (ref 11.6–15.9)
LYMPH%: 28.7 % (ref 14.0–49.7)
MCH: 32.7 pg (ref 25.1–34.0)
MCHC: 32.4 g/dL (ref 31.5–36.0)
MCV: 100.7 fL (ref 79.5–101.0)
MONO#: 0.8 10*3/uL (ref 0.1–0.9)
MONO%: 12.5 % (ref 0.0–14.0)
NEUT%: 54.4 % (ref 38.4–76.8)
NEUTROS ABS: 3.4 10*3/uL (ref 1.5–6.5)
PLATELETS: 86 10*3/uL — AB (ref 145–400)
RBC: 2.97 10*6/uL — ABNORMAL LOW (ref 3.70–5.45)
RDW: 13.5 % (ref 11.2–14.5)
WBC: 6.2 10*3/uL (ref 3.9–10.3)
lymph#: 1.8 10*3/uL (ref 0.9–3.3)
nRBC: 1 % — ABNORMAL HIGH (ref 0–0)

## 2016-08-24 MED ORDER — DARBEPOETIN ALFA 300 MCG/0.6ML IJ SOSY
300.0000 ug | PREFILLED_SYRINGE | Freq: Once | INTRAMUSCULAR | Status: AC
  Administered 2016-08-24: 300 ug via SUBCUTANEOUS
  Filled 2016-08-24: qty 0.6

## 2016-08-24 NOTE — Patient Instructions (Signed)
Darbepoetin Alfa injection What is this medicine? DARBEPOETIN ALFA (dar be POE e tin AL fa) helps your body make more red blood cells. It is used to treat anemia caused by chronic kidney failure and chemotherapy. This medicine may be used for other purposes; ask your health care provider or pharmacist if you have questions. COMMON BRAND NAME(S): Aranesp What should I tell my health care provider before I take this medicine? They need to know if you have any of these conditions: -blood clotting disorders or history of blood clots -cancer patient not on chemotherapy -cystic fibrosis -heart disease, such as angina, heart failure, or a history of a heart attack -hemoglobin level of 12 g/dL or greater -high blood pressure -low levels of folate, iron, or vitamin B12 -seizures -an unusual or allergic reaction to darbepoetin, erythropoietin, albumin, hamster proteins, latex, other medicines, foods, dyes, or preservatives -pregnant or trying to get pregnant -breast-feeding How should I use this medicine? This medicine is for injection into a vein or under the skin. It is usually given by a health care professional in a hospital or clinic setting. If you get this medicine at home, you will be taught how to prepare and give this medicine. Do not shake the solution before you withdraw a dose. Use exactly as directed. Take your medicine at regular intervals. Do not take your medicine more often than directed. It is important that you put your used needles and syringes in a special sharps container. Do not put them in a trash can. If you do not have a sharps container, call your pharmacist or healthcare provider to get one. Talk to your pediatrician regarding the use of this medicine in children. While this medicine may be used in children as young as 1 year for selected conditions, precautions do apply. Overdosage: If you think you have taken too much of this medicine contact a poison control center or  emergency room at once. NOTE: This medicine is only for you. Do not share this medicine with others. What if I miss a dose? If you miss a dose, take it as soon as you can. If it is almost time for your next dose, take only that dose. Do not take double or extra doses. What may interact with this medicine? Do not take this medicine with any of the following medications: -epoetin alfa This list may not describe all possible interactions. Give your health care provider a list of all the medicines, herbs, non-prescription drugs, or dietary supplements you use. Also tell them if you smoke, drink alcohol, or use illegal drugs. Some items may interact with your medicine. What should I watch for while using this medicine? Visit your prescriber or health care professional for regular checks on your progress and for the needed blood tests and blood pressure measurements. It is especially important for the doctor to make sure your hemoglobin level is in the desired range, to limit the risk of potential side effects and to give you the best benefit. Keep all appointments for any recommended tests. Check your blood pressure as directed. Ask your doctor what your blood pressure should be and when you should contact him or her. As your body makes more red blood cells, you may need to take iron, folic acid, or vitamin B supplements. Ask your doctor or health care provider which products are right for you. If you have kidney disease continue dietary restrictions, even though this medication can make you feel better. Talk with your doctor or health   care professional about the foods you eat and the vitamins that you take. What side effects may I notice from receiving this medicine? Side effects that you should report to your doctor or health care professional as soon as possible: -allergic reactions like skin rash, itching or hives, swelling of the face, lips, or tongue -breathing problems -changes in vision -chest  pain -confusion, trouble speaking or understanding -feeling faint or lightheaded, falls -high blood pressure -muscle aches or pains -pain, swelling, warmth in the leg -rapid weight gain -severe headaches -sudden numbness or weakness of the face, arm or leg -trouble walking, dizziness, loss of balance or coordination -seizures (convulsions) -swelling of the ankles, feet, hands -unusually weak or tired Side effects that usually do not require medical attention (report to your doctor or health care professional if they continue or are bothersome): -diarrhea -fever, chills (flu-like symptoms) -headaches -nausea, vomiting -redness, stinging, or swelling at site where injected This list may not describe all possible side effects. Call your doctor for medical advice about side effects. You may report side effects to FDA at 1-800-FDA-1088. Where should I keep my medicine? Keep out of the reach of children. Store in a refrigerator between 2 and 8 degrees C (36 and 46 degrees F). Do not freeze. Do not shake. Throw away any unused portion if using a single-dose vial. Throw away any unused medicine after the expiration date. NOTE: This sheet is a summary. It may not cover all possible information. If you have questions about this medicine, talk to your doctor, pharmacist, or health care provider.  2015, [employer]. (2008-07-15 10:23:57)  

## 2016-09-12 DIAGNOSIS — N185 Chronic kidney disease, stage 5: Secondary | ICD-10-CM | POA: Diagnosis not present

## 2016-09-14 ENCOUNTER — Other Ambulatory Visit (HOSPITAL_BASED_OUTPATIENT_CLINIC_OR_DEPARTMENT_OTHER): Payer: Medicare HMO

## 2016-09-14 ENCOUNTER — Ambulatory Visit: Payer: Medicare HMO

## 2016-09-14 DIAGNOSIS — N189 Chronic kidney disease, unspecified: Secondary | ICD-10-CM | POA: Diagnosis not present

## 2016-09-14 DIAGNOSIS — D631 Anemia in chronic kidney disease: Secondary | ICD-10-CM

## 2016-09-14 LAB — CBC WITH DIFFERENTIAL/PLATELET
BASO%: 0.3 % (ref 0.0–2.0)
Basophils Absolute: 0 10*3/uL (ref 0.0–0.1)
EOS%: 4.4 % (ref 0.0–7.0)
Eosinophils Absolute: 0.3 10*3/uL (ref 0.0–0.5)
HCT: 33.5 % — ABNORMAL LOW (ref 34.8–46.6)
HEMOGLOBIN: 11 g/dL — AB (ref 11.6–15.9)
LYMPH#: 1.5 10*3/uL (ref 0.9–3.3)
LYMPH%: 22.3 % (ref 14.0–49.7)
MCH: 33.2 pg (ref 25.1–34.0)
MCHC: 32.8 g/dL (ref 31.5–36.0)
MCV: 101.2 fL — ABNORMAL HIGH (ref 79.5–101.0)
MONO#: 0.8 10*3/uL (ref 0.1–0.9)
MONO%: 11.8 % (ref 0.0–14.0)
NEUT%: 61.2 % (ref 38.4–76.8)
NEUTROS ABS: 4 10*3/uL (ref 1.5–6.5)
NRBC: 0 % (ref 0–0)
Platelets: 94 10*3/uL — ABNORMAL LOW (ref 145–400)
RBC: 3.31 10*6/uL — AB (ref 3.70–5.45)
RDW: 14.9 % — AB (ref 11.2–14.5)
WBC: 6.6 10*3/uL (ref 3.9–10.3)

## 2016-09-14 NOTE — Progress Notes (Signed)
Hgb noted at 11.0 on lab today, no injection needed per protocol order. Current copy of labs and schedule given to pt and family member. Instructed to follow schedule and call office should issues occur. Pt and family member verbalized understanding of instructions.

## 2016-09-16 DIAGNOSIS — E871 Hypo-osmolality and hyponatremia: Secondary | ICD-10-CM | POA: Diagnosis not present

## 2016-09-16 DIAGNOSIS — N185 Chronic kidney disease, stage 5: Secondary | ICD-10-CM | POA: Diagnosis not present

## 2016-09-16 DIAGNOSIS — E875 Hyperkalemia: Secondary | ICD-10-CM | POA: Diagnosis not present

## 2016-09-16 DIAGNOSIS — E1122 Type 2 diabetes mellitus with diabetic chronic kidney disease: Secondary | ICD-10-CM | POA: Diagnosis not present

## 2016-09-16 DIAGNOSIS — N189 Chronic kidney disease, unspecified: Secondary | ICD-10-CM | POA: Diagnosis not present

## 2016-09-16 DIAGNOSIS — M899 Disorder of bone, unspecified: Secondary | ICD-10-CM | POA: Diagnosis not present

## 2016-09-16 DIAGNOSIS — I120 Hypertensive chronic kidney disease with stage 5 chronic kidney disease or end stage renal disease: Secondary | ICD-10-CM | POA: Diagnosis not present

## 2016-09-26 ENCOUNTER — Observation Stay (HOSPITAL_COMMUNITY)
Admission: EM | Admit: 2016-09-26 | Discharge: 2016-09-27 | Disposition: A | Discharge status: Home / Self Care | Payer: Medicare HMO | Attending: Internal Medicine | Admitting: Internal Medicine

## 2016-09-26 ENCOUNTER — Encounter (HOSPITAL_COMMUNITY): Payer: Self-pay

## 2016-09-26 ENCOUNTER — Observation Stay (HOSPITAL_COMMUNITY): Payer: Medicare HMO

## 2016-09-26 ENCOUNTER — Emergency Department (HOSPITAL_COMMUNITY): Payer: Medicare HMO

## 2016-09-26 ENCOUNTER — Telehealth: Payer: Self-pay | Admitting: Nurse Practitioner

## 2016-09-26 DIAGNOSIS — Z9841 Cataract extraction status, right eye: Secondary | ICD-10-CM | POA: Insufficient documentation

## 2016-09-26 DIAGNOSIS — G8929 Other chronic pain: Secondary | ICD-10-CM | POA: Diagnosis not present

## 2016-09-26 DIAGNOSIS — Z7982 Long term (current) use of aspirin: Secondary | ICD-10-CM | POA: Insufficient documentation

## 2016-09-26 DIAGNOSIS — I10 Essential (primary) hypertension: Secondary | ICD-10-CM | POA: Diagnosis not present

## 2016-09-26 DIAGNOSIS — R2689 Other abnormalities of gait and mobility: Secondary | ICD-10-CM

## 2016-09-26 DIAGNOSIS — Z951 Presence of aortocoronary bypass graft: Secondary | ICD-10-CM | POA: Insufficient documentation

## 2016-09-26 DIAGNOSIS — E785 Hyperlipidemia, unspecified: Secondary | ICD-10-CM | POA: Diagnosis not present

## 2016-09-26 DIAGNOSIS — I2510 Atherosclerotic heart disease of native coronary artery without angina pectoris: Secondary | ICD-10-CM | POA: Insufficient documentation

## 2016-09-26 DIAGNOSIS — I638 Other cerebral infarction: Secondary | ICD-10-CM | POA: Diagnosis not present

## 2016-09-26 DIAGNOSIS — G4700 Insomnia, unspecified: Secondary | ICD-10-CM | POA: Diagnosis not present

## 2016-09-26 DIAGNOSIS — E1122 Type 2 diabetes mellitus with diabetic chronic kidney disease: Secondary | ICD-10-CM | POA: Insufficient documentation

## 2016-09-26 DIAGNOSIS — D631 Anemia in chronic kidney disease: Secondary | ICD-10-CM | POA: Diagnosis not present

## 2016-09-26 DIAGNOSIS — E1151 Type 2 diabetes mellitus with diabetic peripheral angiopathy without gangrene: Secondary | ICD-10-CM | POA: Insufficient documentation

## 2016-09-26 DIAGNOSIS — D696 Thrombocytopenia, unspecified: Secondary | ICD-10-CM | POA: Insufficient documentation

## 2016-09-26 DIAGNOSIS — I6789 Other cerebrovascular disease: Secondary | ICD-10-CM | POA: Diagnosis not present

## 2016-09-26 DIAGNOSIS — I6523 Occlusion and stenosis of bilateral carotid arteries: Secondary | ICD-10-CM | POA: Insufficient documentation

## 2016-09-26 DIAGNOSIS — N184 Chronic kidney disease, stage 4 (severe): Secondary | ICD-10-CM | POA: Diagnosis present

## 2016-09-26 DIAGNOSIS — Z87891 Personal history of nicotine dependence: Secondary | ICD-10-CM | POA: Diagnosis not present

## 2016-09-26 DIAGNOSIS — I132 Hypertensive heart and chronic kidney disease with heart failure and with stage 5 chronic kidney disease, or end stage renal disease: Secondary | ICD-10-CM | POA: Insufficient documentation

## 2016-09-26 DIAGNOSIS — M1990 Unspecified osteoarthritis, unspecified site: Secondary | ICD-10-CM | POA: Insufficient documentation

## 2016-09-26 DIAGNOSIS — E1140 Type 2 diabetes mellitus with diabetic neuropathy, unspecified: Secondary | ICD-10-CM | POA: Insufficient documentation

## 2016-09-26 DIAGNOSIS — I252 Old myocardial infarction: Secondary | ICD-10-CM | POA: Diagnosis not present

## 2016-09-26 DIAGNOSIS — Z91018 Allergy to other foods: Secondary | ICD-10-CM | POA: Insufficient documentation

## 2016-09-26 DIAGNOSIS — F411 Generalized anxiety disorder: Secondary | ICD-10-CM | POA: Insufficient documentation

## 2016-09-26 DIAGNOSIS — K219 Gastro-esophageal reflux disease without esophagitis: Secondary | ICD-10-CM | POA: Diagnosis not present

## 2016-09-26 DIAGNOSIS — I6621 Occlusion and stenosis of right posterior cerebral artery: Secondary | ICD-10-CM | POA: Diagnosis not present

## 2016-09-26 DIAGNOSIS — R011 Cardiac murmur, unspecified: Secondary | ICD-10-CM | POA: Diagnosis not present

## 2016-09-26 DIAGNOSIS — I639 Cerebral infarction, unspecified: Secondary | ICD-10-CM | POA: Diagnosis not present

## 2016-09-26 DIAGNOSIS — Z79899 Other long term (current) drug therapy: Secondary | ICD-10-CM | POA: Insufficient documentation

## 2016-09-26 DIAGNOSIS — M545 Low back pain: Secondary | ICD-10-CM | POA: Diagnosis not present

## 2016-09-26 DIAGNOSIS — I371 Nonrheumatic pulmonary valve insufficiency: Secondary | ICD-10-CM | POA: Insufficient documentation

## 2016-09-26 DIAGNOSIS — Z888 Allergy status to other drugs, medicaments and biological substances status: Secondary | ICD-10-CM | POA: Insufficient documentation

## 2016-09-26 DIAGNOSIS — I447 Left bundle-branch block, unspecified: Secondary | ICD-10-CM | POA: Insufficient documentation

## 2016-09-26 DIAGNOSIS — R2681 Unsteadiness on feet: Secondary | ICD-10-CM | POA: Diagnosis not present

## 2016-09-26 DIAGNOSIS — I350 Nonrheumatic aortic (valve) stenosis: Secondary | ICD-10-CM | POA: Diagnosis not present

## 2016-09-26 DIAGNOSIS — R200 Anesthesia of skin: Secondary | ICD-10-CM | POA: Diagnosis not present

## 2016-09-26 DIAGNOSIS — I071 Rheumatic tricuspid insufficiency: Secondary | ICD-10-CM | POA: Insufficient documentation

## 2016-09-26 DIAGNOSIS — N186 End stage renal disease: Secondary | ICD-10-CM | POA: Diagnosis not present

## 2016-09-26 DIAGNOSIS — Z9842 Cataract extraction status, left eye: Secondary | ICD-10-CM | POA: Insufficient documentation

## 2016-09-26 DIAGNOSIS — I5032 Chronic diastolic (congestive) heart failure: Secondary | ICD-10-CM | POA: Diagnosis not present

## 2016-09-26 LAB — BASIC METABOLIC PANEL
Anion gap: 9 (ref 5–15)
BUN: 42 mg/dL — AB (ref 6–20)
CHLORIDE: 112 mmol/L — AB (ref 101–111)
CO2: 18 mmol/L — ABNORMAL LOW (ref 22–32)
Calcium: 9 mg/dL (ref 8.9–10.3)
Creatinine, Ser: 3.58 mg/dL — ABNORMAL HIGH (ref 0.44–1.00)
GFR calc Af Amer: 13 mL/min — ABNORMAL LOW (ref >60)
GFR calc non Af Amer: 12 mL/min — ABNORMAL LOW (ref >60)
GLUCOSE: 119 mg/dL — AB (ref 65–99)
Potassium: 4.4 mmol/L (ref 3.5–5.1)
Sodium: 139 mmol/L (ref 135–145)

## 2016-09-26 LAB — URINALYSIS, ROUTINE W REFLEX MICROSCOPIC
BILIRUBIN URINE: NEGATIVE
Glucose, UA: NEGATIVE mg/dL
KETONES UR: NEGATIVE mg/dL
Nitrite: NEGATIVE
PH: 5 (ref 5.0–8.0)
Protein, ur: 100 mg/dL — AB
Specific Gravity, Urine: 1.005 (ref 1.005–1.030)
Squamous Epithelial / LPF: NONE SEEN

## 2016-09-26 LAB — CBC
HEMATOCRIT: 29.8 % — AB (ref 36.0–46.0)
HEMOGLOBIN: 9.7 g/dL — AB (ref 12.0–15.0)
MCH: 33.2 pg (ref 26.0–34.0)
MCHC: 32.6 g/dL (ref 30.0–36.0)
MCV: 102.1 fL — AB (ref 78.0–100.0)
Platelets: 82 10*3/uL — ABNORMAL LOW (ref 150–400)
RBC: 2.92 MIL/uL — AB (ref 3.87–5.11)
RDW: 14 % (ref 11.5–15.5)
WBC: 5.3 10*3/uL (ref 4.0–10.5)

## 2016-09-26 MED ORDER — ASPIRIN 325 MG PO TABS
325.0000 mg | ORAL_TABLET | Freq: Every day | ORAL | Status: DC
  Administered 2016-09-26 – 2016-09-27 (×2): 325 mg via ORAL
  Filled 2016-09-26 (×2): qty 1

## 2016-09-26 MED ORDER — VITAMIN D 1000 UNITS PO TABS
1000.0000 [IU] | ORAL_TABLET | Freq: Every day | ORAL | Status: DC
  Administered 2016-09-27: 1000 [IU] via ORAL
  Filled 2016-09-26: qty 1

## 2016-09-26 MED ORDER — AMLODIPINE BESYLATE 5 MG PO TABS
5.0000 mg | ORAL_TABLET | Freq: Every day | ORAL | Status: DC
  Administered 2016-09-27: 5 mg via ORAL
  Filled 2016-09-26: qty 1

## 2016-09-26 MED ORDER — LORAZEPAM 2 MG/ML IJ SOLN
1.0000 mg | Freq: Once | INTRAMUSCULAR | Status: AC
  Administered 2016-09-26: 1 mg via INTRAVENOUS
  Filled 2016-09-26: qty 1

## 2016-09-26 MED ORDER — ACETAMINOPHEN 160 MG/5ML PO SOLN: 650.0000 mg | ORAL | Status: DC | PRN

## 2016-09-26 MED ORDER — HEPARIN SODIUM (PORCINE) 5000 UNIT/ML IJ SOLN
5000.0000 [IU] | Freq: Three times a day (TID) | INTRAMUSCULAR | Status: DC
  Administered 2016-09-26 – 2016-09-27 (×2): 5000 [IU] via SUBCUTANEOUS
  Filled 2016-09-26 (×2): qty 1

## 2016-09-26 MED ORDER — SENNOSIDES-DOCUSATE SODIUM 8.6-50 MG PO TABS: 1.0000 | ORAL_TABLET | Freq: Every evening | ORAL | Status: DC | PRN

## 2016-09-26 MED ORDER — ALPRAZOLAM 0.5 MG PO TABS
0.5000 mg | ORAL_TABLET | Freq: Every day | ORAL | Status: DC
  Administered 2016-09-26: 0.5 mg via ORAL
  Filled 2016-09-26: qty 1

## 2016-09-26 MED ORDER — PATIROMER SORBITEX CALCIUM 8.4 G PO PACK
8.4000 g | PACK | Freq: Every day | ORAL | Status: DC
  Filled 2016-09-26: qty 4

## 2016-09-26 MED ORDER — ROSUVASTATIN CALCIUM 5 MG PO TABS: 5.0000 mg | ORAL_TABLET | Freq: Every day | ORAL | Status: DC

## 2016-09-26 MED ORDER — ACETAMINOPHEN 325 MG PO TABS: 650.0000 mg | ORAL_TABLET | ORAL | Status: DC | PRN

## 2016-09-26 MED ORDER — CARVEDILOL 12.5 MG PO TABS
12.5000 mg | ORAL_TABLET | Freq: Two times a day (BID) | ORAL | Status: DC
  Administered 2016-09-27: 12.5 mg via ORAL
  Filled 2016-09-26: qty 1

## 2016-09-26 MED ORDER — TRIMETHOPRIM 100 MG PO TABS
50.0000 mg | ORAL_TABLET | Freq: Every day | ORAL | Status: DC
  Administered 2016-09-26 – 2016-09-27 (×2): 50 mg via ORAL
  Filled 2016-09-26 (×2): qty 1

## 2016-09-26 MED ORDER — ACETAMINOPHEN 650 MG RE SUPP: 650.0000 mg | RECTAL | Status: DC | PRN

## 2016-09-26 MED ORDER — LOSARTAN POTASSIUM 50 MG PO TABS
25.0000 mg | ORAL_TABLET | Freq: Every day | ORAL | Status: DC
  Administered 2016-09-26 – 2016-09-27 (×2): 25 mg via ORAL
  Filled 2016-09-26 (×2): qty 1

## 2016-09-26 MED ORDER — SODIUM BICARBONATE 650 MG PO TABS
650.0000 mg | ORAL_TABLET | Freq: Two times a day (BID) | ORAL | Status: DC
  Administered 2016-09-26 – 2016-09-27 (×2): 650 mg via ORAL
  Filled 2016-09-26 (×2): qty 1

## 2016-09-26 MED ORDER — CALCIUM ACETATE (PHOS BINDER) 667 MG PO CAPS
1334.0000 mg | ORAL_CAPSULE | Freq: Three times a day (TID) | ORAL | Status: DC
  Administered 2016-09-27: 1334 mg via ORAL
  Filled 2016-09-26: qty 2

## 2016-09-26 MED ORDER — STROKE: EARLY STAGES OF RECOVERY BOOK
Freq: Once | Status: AC
  Administered 2016-09-27: 05:00:00
  Filled 2016-09-26: qty 1

## 2016-09-26 NOTE — Telephone Encounter (Signed)
Pt called back and said she did not want to call the ambulance and wanted to be seen.  Advised pt due to her complaints the safest thing for her to do is to go to the hospital for evaluation.  Pt verbalized understanding and agreed to go to ER.

## 2016-09-26 NOTE — ED Notes (Signed)
Will give pt ativan when MRI comes for her

## 2016-09-26 NOTE — ED Notes (Signed)
Pt transported to MRI 

## 2016-09-26 NOTE — ED Notes (Signed)
Pt up to go to br to get ua sample, w/c and ambulatory

## 2016-09-26 NOTE — ED Provider Notes (Signed)
[place] DEPT Provider Note   CSN: [id number] Arrival date & time: 09/26/16  6962     History   Chief Complaint Chief Complaint  Patient presents with  . Numbness    HPI [name] is a 76 y.o. female. With history of CAD, diabetes mellitus and ESRD not on dialysis that presents to the ED for numbness on the left side of her body.  Patient first discovered the numbness two days ago on Saturday.  She woke up and while drinking coffee felt that her cheek was numb.  She reports the numbness has remained stable for the past two days and is found on her entire left side, head to toe and no area worse than the other.  She called her cardiologist this morning to be seen for the same complaint however, was redirected to the ED.  She denies weakness,vision changes, chest pain, or shortness of breath.  She denies any trauma or falls.  HPI  Past Medical History:  Diagnosis Date  . Anemia    Pt is taking iron.   [name] Anxiety   . Arthritis   . Carotid stenosis    40-59% bilateral ICA stenosis in 2/12.  . Chronic low back pain   . CKD (chronic kidney disease)    Dr. Audie Clear at [facility]  . Coronary artery disease    Pt presented 2/10 to [facility] with NSTEMI and diastolic CHF exacerbation.  LHC was done  3/10 showing 99% pRCA stenosis and 80% calcified pLAD stenosis with L=>R collaterals.  Pt was referred  for CABG which was done by Dr. Prescott Gum with LIMA-LAD, SVG-RCA, SVG-OM.  . Diabetes mellitus   . Diabetic neuropathy ([place])   . Diastolic CHF (HCC)    Echo (2/10) showed EF 55-65%, mild LVH, diastolic dysfunction, mild AS with mean gradient 12 mmHg, PASP 43 mmHg.  Echo (2/12): EF 55-60%, mild LVH, mild AS (mean gradient 12), PA systolic pressure 32 mmHg.     [name] GERD (gastroesophageal reflux disease)   . Heart murmur   . Hyperlipidemia   . Hypertension   . Mild aortic stenosis    mean gradient 12 mmHg in 2/12.  . Myocardial infarction    "mild"  . Pneumonia   . PONV  (postoperative nausea and vomiting)   . Thrombocytopenia ([place])   . Unsteady gait     Patient Active Problem List   Diagnosis Date Noted  . End stage renal disease ([place]) 06/23/2015  . Chronic kidney disease (CKD), stage IV (severe) ([place]) 06/23/2015  . Balance problem 12/22/2013  . Edema 12/28/2011  . CLAUDICATION 11/01/2010  . CAROTID ARTERY STENOSIS 10/29/2009  . AORTIC STENOSIS 09/17/2009  . HYPERTENSION, UNSPECIFIED 11/17/2008  . DIASTOLIC HEART FAILURE, CHRONIC 11/17/2008  . DIAB W/UNS COMP TYPE II/UNS NOT STATED UNCNTRL 11/12/2008  . Other and unspecified hyperlipidemia 11/12/2008  . Anemia 11/12/2008  . Thrombocytopenia ([place]) 11/12/2008  . ANXIETY STATE, UNSPECIFIED 11/12/2008  . CAD 11/12/2008  . UNSPEC COMBINED SYSTOLIC&DIASTOLIC HEART FAILURE 95/28/4132  . ESOPHAGEAL REFLUX 11/12/2008  . Chronic kidney disease 11/12/2008  . LOW BACK PAIN, CHRONIC 11/12/2008  . INSOMNIA UNSPECIFIED 11/12/2008    Past Surgical History:  Procedure Laterality Date  . AV FISTULA PLACEMENT Left 01/30/2015   Procedure: Creation of a Radial Cephalic AV Fistula left wrist;  Surgeon: [name], MD;  Location: [place];  Service: Vascular;  Laterality: Left;  . BACK SURGERY     multiple  . BREAST SURGERY  biopsy  . CARDIAC CATHETERIZATION    . CATARACT EXTRACTION W/ INTRAOCULAR LENS  IMPLANT, BILATERAL    . COLONOSCOPY W/ BIOPSIES AND POLYPECTOMY    . CORONARY ARTERY BYPASS GRAFT  09/2008   pt with NSTEMI and diastolic CHF exacerbation.  LHC was done  3/10 showing 99% pRCA stenosis and 80% calcified pLAD stenosis with L=>R collaterals.  Pt was referred  for CABG which was done by Dr. Prescott Gum with LIMA-LAD, SVG-RCA, SVG-OM.  [name] REVISON OF ARTERIOVENOUS FISTULA Left 07/02/2015   Procedure: REVISON OF LEFT RADIOCEPHALIC ARTERIOVENOUS FISTULA;  Surgeon: [name], MD;  Location: [place];  Service: Vascular;  Laterality: Left;    OB History    No data available       Home  Medications    Prior to Admission medications   Medication Sig Start Date End Date Taking? Authorizing Provider  ALPRAZolam Duanne Moron) 0.5 MG tablet Take 0.5 mg by mouth at bedtime.    Yes Historical Provider, MD  amLODipine (NORVASC) 5 MG tablet Take 1 tablet (5 mg total) by mouth daily. 01/20/15  Yes [name], NP  aspirin 81 MG tablet Take 81 mg by mouth daily.    Yes Historical Provider, MD  calcium acetate (PHOSLO) 667 MG capsule Take 1,334 mg by mouth 3 (three) times daily with meals. As directed 12/17/14  Yes Historical Provider, MD  carvedilol (COREG) 12.5 MG tablet TAKE 1 TABLET(12.5 MG) BY MOUTH TWICE DAILY 03/14/16  Yes [name], NP  cholecalciferol (VITAMIN D) 1000 UNITS tablet Take 1,000 Units by mouth daily.   Yes Historical Provider, MD  furosemide (LASIX) 20 MG tablet Take 20 mg by mouth daily. As directed 12/17/14  Yes Historical Provider, MD  losartan (COZAAR) 25 MG tablet Take 1 tablet (25 mg total) by mouth daily. 04/22/14  Yes Larey Dresser, MD  rosuvastatin (CRESTOR) 5 MG tablet TAKE 1 TABLET(5 MG) BY MOUTH DAILY 07/05/16  Yes [name], NP  sodium bicarbonate 650 MG tablet Take 650 mg by mouth 2 (two) times daily.   Yes Historical Provider, MD  trimethoprim (TRIMPEX) 100 MG tablet Take 50 mg by mouth daily.  11/07/14  Yes Historical Provider, MD  VELTASSA 8.4 G packet Take 8.4 g by mouth daily after lunch.  05/28/15  Yes Historical Provider, MD    Family History Family History  Problem Relation Age of Onset  . Adopted: Yes  . Family history unknown: Yes    Social History Social History  Substance Use Topics  . Smoking status: Former Smoker    Types: Cigarettes  . Smokeless tobacco: Never Used     Comment: quit 1988  . Alcohol use Yes     Comment: beer daily     Allergies   Lipitor [atorvastatin] and Strawberry extract   Review of Systems Review of Systems  Constitutional: Negative for fever.  Respiratory: Negative for cough and shortness of  breath.   Cardiovascular: Negative for chest pain.  Gastrointestinal: Negative for abdominal pain.  Musculoskeletal: Negative for myalgias.  Neurological: Negative for weakness.     Physical Exam Updated Vital Signs BP (!) 158/104 (BP Location: Right Arm)   Pulse 72   Temp 97.7 F (36.5 C) (Oral)   Resp 16   SpO2 99%   Physical Exam  Constitutional: She is oriented to person, place, and time. She appears well-developed and well-nourished.  Eyes: Pupils are equal, round, and reactive to light.  Cardiovascular: Normal rate, regular rhythm and  normal heart sounds.  Exam reveals no gallop and no friction rub.   No murmur heard. Pulmonary/Chest: Effort normal and breath sounds normal. No respiratory distress. She has no wheezes. She has no rales.  Neurological: She is alert and oriented to person, place, and time. No cranial nerve deficit. Coordination normal.  Sensation to light tough was intact bilaterally in upper and lower extremities 5/5 motor strength in upper and lower extremities bilaterally  Skin: Skin is warm and dry.     ED Treatments / Results  Labs (all labs ordered are listed, but only abnormal results are displayed) Labs Reviewed  CBC - Abnormal; Notable for the following:       Result Value   RBC 2.92 (*)    Hemoglobin 9.7 (*)    HCT 29.8 (*)    MCV 102.1 (*)    Platelets 82 (*)    All other components within normal limits  BASIC METABOLIC PANEL - Abnormal; Notable for the following:    Chloride 112 (*)    CO2 18 (*)    Glucose, Bld 119 (*)    BUN 42 (*)    Creatinine, Ser 3.58 (*)    GFR calc non Af Amer 12 (*)    GFR calc Af Amer 13 (*)    All other components within normal limits    EKG  EKG Interpretation  Date/Time:  Monday September 26 2016 09:53:13 EST Ventricular Rate:  65 PR Interval:    QRS Duration: 129 QT Interval:  432 QTC Calculation: 450 R Axis:   18 Text Interpretation:  Junctional rhythm Left bundle branch block No significant  change was found Confirmed by CAMPOS  MD, KEVIN ([id number]) on 09/26/2016 11:05:21 AM       Radiology No results found.  Procedures Procedures (including critical care time)  Medications Ordered in ED Medications  LORazepam (ATIVAN) injection 1 mg (1 mg Intravenous Given 09/26/16 1512)     Initial Impression / Assessment and Plan / ED Course  I have reviewed the triage vital signs and the nursing notes.  Pertinent labs & imaging results that were available during my care of the patient were reviewed by me and considered in my medical decision making (see chart for details).   Patient had paresthesias to the left side of her body starting 2 days ago.  MRI of brain showed a 7 mm acute ischemic lacunar type infarct involving the right thalamus. No associated hemorrhage.  Neurology was consulted.  Discussed case with [facility] hospitalist and agreed to admission.   Final Clinical Impressions(s) / ED Diagnoses   Final diagnoses:  None    New Prescriptions New Prescriptions   No medications on file     Valinda Party, DO 09/26/16 [place], MD 09/27/16 [phone]

## 2016-09-26 NOTE — ED Triage Notes (Signed)
Co of L sided numbness that has been going on for 2 days. sts she feels it mostly in her face, no droop noted.advised to take 324ASA before EMS arrived. Hy. Of MI, triple bypass, stent placement, and kidney failure. Hasn't started dialysis yet

## 2016-09-26 NOTE — ED Notes (Signed)
Placed patient into a gown and on the monitor did ekg shown to Dr [employer]

## 2016-09-26 NOTE — Telephone Encounter (Signed)
Received call transferred directly from operator and spoke with pt   She report numbness on left side of body from head to toes.  Has been going on since Saturday. No facial droop. No problems with speech.  I instructed pt to call 911 for transport to hospital.

## 2016-09-26 NOTE — H&P (Signed)
History and Physical    [name]:[id number] DOB: [birth year] DOA: 09/26/2016  Referring MD/NP/PA:  PCP: [name], MD Outpatient Specialists: cards ([facility]), nephrology ([facility]) Patient coming from: home  Chief Complaint: L sided numbness  HPI: [name] is a 76 y.o. female with medical history significant of CKD, diastolic CHF, HLD, CM, and HTN.  Saturday patient awoke with numbness on left side of face/arms/legs.  She thought is would get better but did not.  She called her cardiologist office this AM and they told her to come to the ER.  Numbness has not worsened nor has it gotten better.  No intervention tried. No SOB, no CP, no fever, no chills. MRI was done that showed 7 mm acute ischemic lacunar type infarct involving the right thalamus. No associated hemorrhage. 2. Mild age-related cerebral atrophy with chronic microvascular ischemic disease.  Patient drinks 3 beers/night.  Never had withdrawal.  Patient also used to have DM but with dietary changes, has lost 25 lbs and has been able to come off medications.      Review of Systems: all systems reviewed, negative unless stated above in HPI   Past Medical History:  Diagnosis Date  . Anemia    Pt is taking iron.   [name] Anxiety   . Arthritis   . Carotid stenosis    40-59% bilateral ICA stenosis in 2/12.  . Chronic low back pain   . CKD (chronic kidney disease)    Dr. Audie Clear at [facility]  . Coronary artery disease    Pt presented 2/10 to [facility] with NSTEMI and diastolic CHF exacerbation.  LHC was done  3/10 showing 99% pRCA stenosis and 80% calcified pLAD stenosis with L=>R collaterals.  Pt was referred  for CABG which was done by Dr. Prescott Gum with LIMA-LAD, SVG-RCA, SVG-OM.  . Diabetes mellitus   . Diabetic neuropathy ([place])   . Diastolic CHF (HCC)    Echo (2/10) showed EF 55-65%, mild LVH, diastolic dysfunction, mild AS with mean gradient 12 mmHg, PASP 43 mmHg.  Echo (2/12): EF 55-60%, mild LVH,  mild AS (mean gradient 12), PA systolic pressure 32 mmHg.     [name] GERD (gastroesophageal reflux disease)   . Heart murmur   . Hyperlipidemia   . Hypertension   . Mild aortic stenosis    mean gradient 12 mmHg in 2/12.  . Myocardial infarction    "mild"  . Pneumonia   . PONV (postoperative nausea and vomiting)   . Thrombocytopenia ([place])   . Unsteady gait     Past Surgical History:  Procedure Laterality Date  . AV FISTULA PLACEMENT Left 01/30/2015   Procedure: Creation of a Radial Cephalic AV Fistula left wrist;  Surgeon: [name], MD;  Location: [place];  Service: Vascular;  Laterality: Left;  . BACK SURGERY     multiple  . BREAST SURGERY     biopsy  . CARDIAC CATHETERIZATION    . CATARACT EXTRACTION W/ INTRAOCULAR LENS  IMPLANT, BILATERAL    . COLONOSCOPY W/ BIOPSIES AND POLYPECTOMY    . CORONARY ARTERY BYPASS GRAFT  09/2008   pt with NSTEMI and diastolic CHF exacerbation.  LHC was done  3/10 showing 99% pRCA stenosis and 80% calcified pLAD stenosis with L=>R collaterals.  Pt was referred  for CABG which was done by Dr. Prescott Gum with LIMA-LAD, SVG-RCA, SVG-OM.  [name] REVISON OF ARTERIOVENOUS FISTULA Left 07/02/2015   Procedure: REVISON OF LEFT RADIOCEPHALIC ARTERIOVENOUS FISTULA;  Surgeon:  [name], MD;  Location: [place];  Service: Vascular;  Laterality: Left;     reports that she has quit smoking. Her smoking use included Cigarettes. She has never used smokeless tobacco. She reports that she drinks alcohol. She reports that she does not use drugs.  Allergies  Allergen Reactions  . Lipitor [Atorvastatin] Other (See Comments)    Stomach pain  . Strawberry Extract Rash    Family History  Problem Relation Age of Onset  . Adopted: Yes  . Family history unknown: Yes     Prior to Admission medications   Medication Sig Start Date End Date Taking? Authorizing Provider  ALPRAZolam Duanne Moron) 0.5 MG tablet Take 0.5 mg by mouth at bedtime.    Yes Historical Provider, MD    amLODipine (NORVASC) 5 MG tablet Take 1 tablet (5 mg total) by mouth daily. 01/20/15  Yes [name], NP  aspirin 81 MG tablet Take 81 mg by mouth daily.    Yes Historical Provider, MD  calcium acetate (PHOSLO) 667 MG capsule Take 1,334 mg by mouth 3 (three) times daily with meals. As directed 12/17/14  Yes Historical Provider, MD  carvedilol (COREG) 12.5 MG tablet TAKE 1 TABLET(12.5 MG) BY MOUTH TWICE DAILY 03/14/16  Yes [name], NP  cholecalciferol (VITAMIN D) 1000 UNITS tablet Take 1,000 Units by mouth daily.   Yes Historical Provider, MD  furosemide (LASIX) 20 MG tablet Take 20 mg by mouth daily. As directed 12/17/14  Yes Historical Provider, MD  losartan (COZAAR) 25 MG tablet Take 1 tablet (25 mg total) by mouth daily. 04/22/14  Yes Larey Dresser, MD  rosuvastatin (CRESTOR) 5 MG tablet TAKE 1 TABLET(5 MG) BY MOUTH DAILY 07/05/16  Yes [name], NP  sodium bicarbonate 650 MG tablet Take 650 mg by mouth 2 (two) times daily.   Yes Historical Provider, MD  trimethoprim (TRIMPEX) 100 MG tablet Take 50 mg by mouth daily.  11/07/14  Yes Historical Provider, MD  VELTASSA 8.4 G packet Take 8.4 g by mouth daily after lunch.  05/28/15  Yes Historical Provider, MD    Physical Exam: Vitals:   09/26/16 1430 09/26/16 1432 09/26/16 1657 09/26/16 1700  BP: (!) 166/109 (!) 158/104  (!) 136/54  Pulse: 65 72 (!) 57 (!) 58  Resp: 14 16 17 17   Temp:      TempSrc:      SpO2: 98% 99% 100% 99%      Constitutional: NAD, calm, comfortable Vitals:   09/26/16 1430 09/26/16 1432 09/26/16 1657 09/26/16 1700  BP: (!) 166/109 (!) 158/104  (!) 136/54  Pulse: 65 72 (!) 57 (!) 58  Resp: 14 16 17 17   Temp:      TempSrc:      SpO2: 98% 99% 100% 99%   Eyes: PERRL, lids and conjunctivae normal ENMT: Mucous membranes are moist. Posterior pharynx clear of any exudate or lesions.Normal dentition.  Neck: normal, supple, no masses, no thyromegaly Respiratory: clear to auscultation bilaterally, no  wheezing, no crackles. Normal respiratory effort. No accessory muscle use.  Cardiovascular: Regular rate and rhythm, no murmurs / rubs / gallops. No extremity edema. 2+ pedal pulses. No carotid bruits.  Abdomen: no tenderness, no masses palpated. No hepatosplenomegaly. Bowel sounds positive.  Musculoskeletal: no clubbing / cyanosis. No joint deformity upper and lower extremities. Good ROM, no contractures. Normal muscle tone.  Skin: no rashes, lesions, ulcers. No induration Neurologic: CN 2-12 grossly intact. Sensation intact, DTR normal. Strength 5/5 in all  4.  Psychiatric: Normal judgment and insight. Alert and oriented x 3. Normal mood.     Labs on Admission: I have personally reviewed following labs and imaging studies  CBC:  Recent Labs Lab 09/26/16 1314  WBC 5.3  HGB 9.7*  HCT 29.8*  MCV 102.1*  PLT 82*   Basic Metabolic Panel:  Recent Labs Lab 09/26/16 1314  NA 139  K 4.4  CL 112*  CO2 18*  GLUCOSE 119*  BUN 42*  CREATININE 3.58*  CALCIUM 9.0   GFR: CrCl cannot be calculated (Unknown ideal weight.). Liver Function Tests: No results for input(s): AST, ALT, ALKPHOS, BILITOT, PROT, ALBUMIN in the last 168 hours. No results for input(s): LIPASE, AMYLASE in the last 168 hours. No results for input(s): AMMONIA in the last 168 hours. Coagulation Profile: No results for input(s): INR, PROTIME in the last 168 hours. Cardiac Enzymes: No results for input(s): CKTOTAL, CKMB, CKMBINDEX, TROPONINI in the last 168 hours. BNP (last 3 results) No results for input(s): PROBNP in the last 8760 hours. HbA1C: No results for input(s): HGBA1C in the last 72 hours. CBG: No results for input(s): GLUCAP in the last 168 hours. Lipid Profile: No results for input(s): CHOL, HDL, LDLCALC, TRIG, CHOLHDL, LDLDIRECT in the last 72 hours. Thyroid Function Tests: No results for input(s): TSH, T4TOTAL, FREET4, T3FREE, THYROIDAB in the last 72 hours. Anemia Panel: No results for input(s):  VITAMINB12, FOLATE, FERRITIN, TIBC, IRON, RETICCTPCT in the last 72 hours. Urine analysis:    Component Value Date/Time   COLORURINE YELLOW 10/14/2008 0612   APPEARANCEUR CLEAR 10/14/2008 0612   LABSPEC 1.013 10/14/2008 0612   PHURINE 5.5 10/14/2008 0612   GLUCOSEU NEGATIVE 10/14/2008 0612   HGBUR SMALL (A) 10/14/2008 0612   BILIRUBINUR NEGATIVE 10/14/2008 0612   KETONESUR NEGATIVE 10/14/2008 0612   PROTEINUR 30 (A) 10/14/2008 0612   UROBILINOGEN 0.2 10/14/2008 0612   NITRITE NEGATIVE 10/14/2008 0612   LEUKOCYTESUR NEGATIVE 10/14/2008 0612   Sepsis Labs: Invalid input(s): PROCALCITONIN, LACTICIDVEN No results found for this or any previous visit (from the past 240 hour(s)).   Radiological Exams on Admission: Mr Brain Wo Contrast  Result Date: 09/26/2016 CLINICAL DATA:  Initial evaluation for acute left-sided numbness. EXAM: MRI HEAD WITHOUT CONTRAST TECHNIQUE: Multiplanar, multiecho pulse sequences of the brain and surrounding structures were obtained without intravenous contrast. COMPARISON:  None available. FINDINGS: Brain: Mild diffuse prominence of the CSF containing spaces is compatible with generalized age-related cerebral atrophy. Patchy and confluent T2/FLAIR hyperintensity within the periventricular and deep white matter both cerebral hemispheres most consistent with chronic microvascular ischemic disease. Chronic microvascular ischemic changes noted within the pons as well. 7 mm focus of restricted diffusion within the right thalamus, compatible with a small acute ischemic lacunar type infarct. No associated mass effect or hemorrhage. No other area of acute infarction identified. No evidence for acute or chronic intracranial hemorrhage. No mass lesion, midline shift, or mass effect. No hydrocephalus. No extra-axial fluid collection. Major dural sinuses are grossly patent. Pituitary gland and suprasellar region within normal limits. Vascular: Major intracranial vascular flow voids  are maintained. Skull and upper cervical spine: Craniocervical junction normal. Visualized upper cervical spine without acute abnormality. Bone marrow signal intensity within normal limits. No scalp soft tissue abnormality. Sinuses/Orbits: Globes and orbital soft tissues within normal limits. Patient is status post cataract extraction bilaterally. Mild scattered mucosal thickening within the ethmoidal air cells and maxillary sinuses. Paranasal sinuses are otherwise clear. Small left mastoid effusion noted. Inner ear structures within  normal limits. IMPRESSION: 1. 7 mm acute ischemic lacunar type infarct involving the right thalamus. No associated hemorrhage. 2. Mild age-related cerebral atrophy with chronic microvascular ischemic disease. Electronically Signed   By: Jeannine Boga M.D.   On: 09/26/2016 16:09    EKG: Independently reviewed. No a fib seen  Assessment/Plan Active Problems:   Essential hypertension   Chronic kidney disease (CKD), stage IV (severe) (HCC)   CVA (cerebral vascular accident) ([place])   CVA -MRI +: 7 mm acute ischemic lacunar type infarct involving the right thalamus. No associated hemorrhage -FLP/LFTs ordered -MRA -echo -carotids -neuro consult -not a TPA candidate due to time (started Saturday) -was on 81 mg ASA before this, now 325 mg  CKD- stage IV -not on dialysis -follows in [place] -still makes urine  HTN -allow permissive HTN due to CVA  H/o DM -off all meds after a 25lb weight loss  Anemia of CKD -stable -follows with nephrology in [place]  Alcohol use -3 coronas/day -never had symptoms of withdrawal  Diabetic neuropathy -walks with cane when not in her house   DVT prophylaxis:heparin Code Status: full Family Communication: patient Disposition Plan: home 1-2 days Consults called: *neuro (nath- called by ED provider) Admission status: obs- suspect can d/c in AM   [facility] Pager [phone]  If  7PM-7AM, please contact night-coverage [email or web address] Password [facility]  09/26/2016, 5:30 PM

## 2016-09-26 NOTE — Telephone Encounter (Signed)
New Message      Pt is calling c/o numbness down the left side of her body. She is asking if she can come in today.

## 2016-09-26 NOTE — Progress Notes (Signed)
Arrived from Ed. Alert and oriented. Denies any pain. Bed in lowest position. Call light within reach.

## 2016-09-27 ENCOUNTER — Observation Stay (HOSPITAL_BASED_OUTPATIENT_CLINIC_OR_DEPARTMENT_OTHER): Payer: Medicare HMO

## 2016-09-27 DIAGNOSIS — I63531 Cerebral infarction due to unspecified occlusion or stenosis of right posterior cerebral artery: Secondary | ICD-10-CM

## 2016-09-27 DIAGNOSIS — I6523 Occlusion and stenosis of bilateral carotid arteries: Secondary | ICD-10-CM

## 2016-09-27 DIAGNOSIS — D649 Anemia, unspecified: Secondary | ICD-10-CM

## 2016-09-27 DIAGNOSIS — N184 Chronic kidney disease, stage 4 (severe): Secondary | ICD-10-CM | POA: Diagnosis not present

## 2016-09-27 DIAGNOSIS — I10 Essential (primary) hypertension: Secondary | ICD-10-CM | POA: Diagnosis not present

## 2016-09-27 DIAGNOSIS — I6789 Other cerebrovascular disease: Secondary | ICD-10-CM

## 2016-09-27 DIAGNOSIS — D696 Thrombocytopenia, unspecified: Secondary | ICD-10-CM

## 2016-09-27 LAB — LIPID PANEL
CHOL/HDL RATIO: 1.9 ratio
CHOLESTEROL: 99 mg/dL (ref 0–200)
HDL: 51 mg/dL (ref >40)
LDL CALC: 31 mg/dL (ref 0–99)
TRIGLYCERIDES: 84 mg/dL (ref <150)
VLDL: 17 mg/dL (ref 0–40)

## 2016-09-27 LAB — BASIC METABOLIC PANEL
Anion gap: 11 (ref 5–15)
BUN: 42 mg/dL — AB (ref 6–20)
CHLORIDE: 112 mmol/L — AB (ref 101–111)
CO2: 19 mmol/L — ABNORMAL LOW (ref 22–32)
Calcium: 8.7 mg/dL — ABNORMAL LOW (ref 8.9–10.3)
Creatinine, Ser: 3.65 mg/dL — ABNORMAL HIGH (ref 0.44–1.00)
GFR calc Af Amer: 13 mL/min — ABNORMAL LOW (ref >60)
GFR calc non Af Amer: 11 mL/min — ABNORMAL LOW (ref >60)
GLUCOSE: 110 mg/dL — AB (ref 65–99)
POTASSIUM: 3.7 mmol/L (ref 3.5–5.1)
Sodium: 142 mmol/L (ref 135–145)

## 2016-09-27 LAB — ECHOCARDIOGRAM COMPLETE
HEIGHTINCHES: 68 in
Weight: 2163.2 oz

## 2016-09-27 LAB — CBC
HEMATOCRIT: 27.4 % — AB (ref 36.0–46.0)
Hemoglobin: 8.9 g/dL — ABNORMAL LOW (ref 12.0–15.0)
MCH: 32.7 pg (ref 26.0–34.0)
MCHC: 32.5 g/dL (ref 30.0–36.0)
MCV: 100.7 fL — AB (ref 78.0–100.0)
Platelets: 95 10*3/uL — ABNORMAL LOW (ref 150–400)
RBC: 2.72 MIL/uL — AB (ref 3.87–5.11)
RDW: 13.9 % (ref 11.5–15.5)
WBC: 4.2 10*3/uL (ref 4.0–10.5)

## 2016-09-27 MED ORDER — ASPIRIN 325 MG PO TABS: 325.0000 mg | ORAL_TABLET | Freq: Every day | ORAL | Status: DC

## 2016-09-27 NOTE — Discharge Summary (Signed)
Physician Discharge Summary  [name] EXN:[id number] DOB: 1941/01/21 DOA: 09/26/2016  PCP: [name], MD  Admit date: 09/26/2016 Discharge date: 09/27/2016   Recommendations for Outpatient Follow-Up:   1. Vascular follow up 2.    Discharge Diagnosis:   Active Problems:   Essential hypertension   Chronic kidney disease (CKD), stage IV (severe) (HCC)   CVA (cerebral vascular accident) [facility])   Discharge disposition:  Home.  Discharge Condition: Improved.  Diet recommendation: Low sodium, heart healthy  Wound care: None.   History of Present Illness:   [name] is a 76 y.o. female with medical history significant of CKD, diastolic CHF, HLD, CM, and HTN.  Saturday patient awoke with numbness on left side of face/arms/legs.  She thought is would get better but did not.  She called her cardiologist office this AM and they told her to come to the ER.  Numbness has not worsened nor has it gotten better.  No intervention tried. No SOB, no CP, no fever, no chills. MRI was done that showed 7 mm acute ischemic lacunar type infarct involving the right thalamus. No associated hemorrhage. 2. Mild age-related cerebral atrophy with chronic microvascular ischemic disease.  Patient drinks 3 beers/night.  Never had withdrawal.  Patient also used to have DM but with dietary changes, has lost 25 lbs and has been able to come off medications.     Hospital Course by Problem:   CVA -MRI/MRA: 7 mm acute ischemic lacunar type infarct involving the right thalamus. No associated hemorrhage. 2. Mild age-related cerebral atrophy with chronic microvascular ischemic disease. Severe stenosis RIGHT P2 and LEFT M1 segments, moderate stenosis RIGHT M2 origin compatible with atherosclerosis. Moderate stenosis proximal RIGHT cavernous segment. 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  -echo:focal basal and mild concentric hypertrophy. Systolic function was  normal. The estimated ejection fraction was in the range of 55% to 60%. Wall motion was normal; there were no regional wall motion abnormalities. Features are consistent with a pseudonormal left ventricular filling pattern, with concomitant abnormal relaxation and increased filling pressure (grade 2 diastolic dysfunction). - Aortic valve: The AV appears mildly to moderately stenotic by doppler with a mean AV gradient of 53mmHg. The calculated AVA is 0.9cm2 and likely underestimates with AVA due to inaccurate measurment of the LVOT. Visually there appears to be mild to moderate AS. Valve area (VTI): 0.9 cm^2. Valve area (Vmax): 0.89 cm^2. Valve area (Vmean): 0.86 cm^2. LDL < 70-- continue statin HgbA1C pending -carotid: Severe amount of plaque are subjectivelysuggestive of a 40-59% percent stenosis bilaterally. The right vertebral demonstrates atypical flow, left vertebral is patent and antegrade.  CKD IV -follows closely with nephrology  HTN -resume home meds  Alcohol use -no sign of withdrawal  Anemia/thrombocytopenia -outpatient follow up with Dr. Osker Mason -CBC   Medical Consultants:    Neuro   Discharge Exam:   Vitals:   09/27/16 0818 09/27/16 1325  BP: (!) 153/58 (!) 131/41  Pulse: 69 67  Resp: 16 16  Temp: 98.4 F (36.9 C) 98.4 F (36.9 C)   Vitals:   09/27/16 0408 09/27/16 0600 09/27/16 0818 09/27/16 1325  BP: (!) 128/56 115/60 (!) 153/58 (!) 131/41  Pulse: 64 64 69 67  Resp: 16 16 16 16   Temp:  97.8 F (36.6 C) 98.4 F (36.9 C) 98.4 F (36.9 C)  TempSrc:  Oral Oral Oral  SpO2: 97% 96% 100% 98%  Weight:      Height:  Gen:  NAD    The results of significant diagnostics from this hospitalization (including imaging, microbiology, ancillary and laboratory) are listed below for reference.     Procedures and Diagnostic Studies:   Dg Chest 2 View  Result Date: 09/26/2016 CLINICAL DATA:  Acute onset of generalized  left-sided numbness. Initial encounter. EXAM: CHEST  2 VIEW COMPARISON:  Chest radiograph performed 11/10/2008 FINDINGS: The lungs are well-aerated and clear. There is no evidence of focal opacification, pleural effusion or pneumothorax. The heart is borderline normal in size. The patient is status post median sternotomy, with evidence of prior CABG. No acute osseous abnormalities are seen. IMPRESSION: No acute cardiopulmonary process seen. Electronically Signed   By: Garald Balding M.D.   On: 09/26/2016 21:13   Mr Brain Wo Contrast  Result Date: 09/26/2016 CLINICAL DATA:  Initial evaluation for acute left-sided numbness. EXAM: MRI HEAD WITHOUT CONTRAST TECHNIQUE: Multiplanar, multiecho pulse sequences of the brain and surrounding structures were obtained without intravenous contrast. COMPARISON:  None available. FINDINGS: Brain: Mild diffuse prominence of the CSF containing spaces is compatible with generalized age-related cerebral atrophy. Patchy and confluent T2/FLAIR hyperintensity within the periventricular and deep white matter both cerebral hemispheres most consistent with chronic microvascular ischemic disease. Chronic microvascular ischemic changes noted within the pons as well. 7 mm focus of restricted diffusion within the right thalamus, compatible with a small acute ischemic lacunar type infarct. No associated mass effect or hemorrhage. No other area of acute infarction identified. No evidence for acute or chronic intracranial hemorrhage. No mass lesion, midline shift, or mass effect. No hydrocephalus. No extra-axial fluid collection. Major dural sinuses are grossly patent. Pituitary gland and suprasellar region within normal limits. Vascular: Major intracranial vascular flow voids are maintained. Skull and upper cervical spine: Craniocervical junction normal. Visualized upper cervical spine without acute abnormality. Bone marrow signal intensity within normal limits. No scalp soft tissue  abnormality. Sinuses/Orbits: Globes and orbital soft tissues within normal limits. Patient is status post cataract extraction bilaterally. Mild scattered mucosal thickening within the ethmoidal air cells and maxillary sinuses. Paranasal sinuses are otherwise clear. Small left mastoid effusion noted. Inner ear structures within normal limits. IMPRESSION: 1. 7 mm acute ischemic lacunar type infarct involving the right thalamus. No associated hemorrhage. 2. Mild age-related cerebral atrophy with chronic microvascular ischemic disease. Electronically Signed   By: Jeannine Boga M.D.   On: 09/26/2016 16:09   Mr Jodene Nam Head/brain UU Cm  Result Date: 09/26/2016 CLINICAL DATA:  LEFT-sided numbness beginning 2 days ago. History of diabetes, end-stage renal disease, hypertension, hyperlipidemia. Follow-up stroke. EXAM: MRA HEAD WITHOUT CONTRAST TECHNIQUE: Angiographic images of the Circle of Willis were obtained using MRA technique without intravenous contrast. COMPARISON:  MRI of the head September 26, 2016 at 1537 hours FINDINGS: ANTERIOR CIRCULATION: Flow related enhancement of the included cervical, petrous, cavernous and supraclinoid internal carotid arteries. Moderate stenosis proximal RIGHT cavernous segment. Mild stenosis bilateral supraclinoid segments. Patent anterior communicating artery. Normal flow related enhancement of the anterior cerebral arteries. 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 Moderate stenosis RIGHT M2 origin. No large vessel occlusion, abnormal luminal irregularity, aneurysm. POSTERIOR CIRCULATION: LEFT vertebral artery is dominant. RIGHT vertebral artery terminates in the posterior inferior cerebellar artery. Basilar artery is patent, with normal flow related enhancement of the main branch vessels. Flow related enhancement of the posterior cerebral arteries. Small RIGHT, tiny LEFT posterior communicating arteries present. Severe stenosis distal  RIGHT P2 segment. No large vessel occlusion, abnormal luminal irregularity,  aneurysm. ANATOMIC VARIANTS: Proximal basilar artery fenestration. IMPRESSION: No emergent large vessel occlusion. Severe stenosis RIGHT P2 and LEFT M1 segments, moderate stenosis RIGHT M2 origin compatible with atherosclerosis. Moderate stenosis proximal RIGHT cavernous segment. 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 Electronically Signed   By: Elon Alas M.D.   On: 09/26/2016 22:07     Labs:   Basic Metabolic Panel:  Recent Labs Lab 09/26/16 1314 09/27/16 0309  NA 139 142  K 4.4 3.7  CL 112* 112*  CO2 18* 19*  GLUCOSE 119* 110*  BUN 42* 42*  CREATININE 3.58* 3.65*  CALCIUM 9.0 8.7*   GFR Estimated Creatinine Clearance: 13.1 mL/min (by C-G formula based on SCr of 3.65 mg/dL (H)). Liver Function Tests: No results for input(s): AST, ALT, ALKPHOS, BILITOT, PROT, ALBUMIN in the last 168 hours. No results for input(s): LIPASE, AMYLASE in the last 168 hours. No results for input(s): AMMONIA in the last 168 hours. Coagulation profile No results for input(s): INR, PROTIME in the last 168 hours.  CBC:  Recent Labs Lab 09/26/16 1314 09/27/16 0309  WBC 5.3 4.2  HGB 9.7* 8.9*  HCT 29.8* 27.4*  MCV 102.1* 100.7*  PLT 82* 95*   Cardiac Enzymes: No results for input(s): CKTOTAL, CKMB, CKMBINDEX, TROPONINI in the last 168 hours. BNP: Invalid input(s): POCBNP CBG: No results for input(s): GLUCAP in the last 168 hours. D-Dimer No results for input(s): DDIMER in the last 72 hours. Hgb A1c No results for input(s): HGBA1C in the last 72 hours. Lipid Profile  Recent Labs  09/27/16 0309  CHOL 99  HDL 51  LDLCALC 31  TRIG 84  CHOLHDL 1.9   Thyroid function studies No results for input(s): TSH, T4TOTAL, T3FREE, THYROIDAB in the last 72 hours.  Invalid input(s): FREET3 Anemia work up No results for input(s): VITAMINB12, FOLATE, FERRITIN, TIBC, IRON, RETICCTPCT in the last  72 hours. Microbiology No results found for this or any previous visit (from the past 240 hour(s)).   Discharge Instructions:   Discharge Instructions    Ambulatory referral to Neurology    Complete by:  As directed    Follow up with NP Cecille Rubin at [facility] in about 2 months. Thanks.   Discharge instructions    Complete by:  As directed    Cbc 1-2 weeks with Dr. Osker Mason Renal diet   Increase activity slowly    Complete by:  As directed      Allergies as of 09/27/2016      Reactions   Lipitor [atorvastatin] Other (See Comments)   Stomach pain   Strawberry Extract Rash      Medication List    TAKE these medications   ALPRAZolam 0.5 MG tablet Commonly known as:  XANAX Take 0.5 mg by mouth at bedtime.   amLODipine 5 MG tablet Commonly known as:  NORVASC Take 1 tablet (5 mg total) by mouth daily.   aspirin 325 MG tablet Take 1 tablet (325 mg total) by mouth daily. Start taking on:  09/28/2016 What changed:  medication strength  how much to take   calcium acetate 667 MG capsule Commonly known as:  PHOSLO Take 1,334 mg by mouth 3 (three) times daily with meals. As directed   carvedilol 12.5 MG tablet Commonly known as:  COREG TAKE 1 TABLET(12.5 MG) BY MOUTH TWICE DAILY   cholecalciferol 1000 units tablet Commonly known as:  VITAMIN D Take 1,000 Units by mouth daily.   furosemide 20 MG tablet Commonly known as:  LASIX Take 20 mg by mouth daily. As directed   losartan 25 MG tablet Commonly known as:  COZAAR Take 1 tablet (25 mg total) by mouth daily.   rosuvastatin 5 MG tablet Commonly known as:  CRESTOR TAKE 1 TABLET(5 MG) BY MOUTH DAILY   sodium bicarbonate 650 MG tablet Take 650 mg by mouth 2 (two) times daily.   trimethoprim 100 MG tablet Commonly known as:  TRIMPEX Take 50 mg by mouth daily.   VELTASSA 8.4 g packet Generic drug:  patiromer Take 8.4 g by mouth daily after lunch.      Follow-up Information    [name], MD Follow up  in 1 week(s).   Specialty:  Family Medicine Contact information: [address] [place] [address] [phone]        Tinnie Gens, MD Follow up.   Specialties:  Vascular Surgery, Interventional Cardiology, Cardiology Why:  carotid stenosis Contact information: [name] [address] [phone]        Dennie Bible, NP. Schedule an appointment as soon as possible for a visit in 6 week(s).   Specialty:  Family Medicine Contact information: [address] [place] [address] [phone]            Time coordinating discharge: 25 min  Signed:  Geradine Girt   [facility] 09/27/2016, 2:29 PM

## 2016-09-27 NOTE — Telephone Encounter (Signed)
Patient admitted for CVA. Richardson Dopp, PA-C   09/27/2016 12:56 PM

## 2016-09-27 NOTE — Consult Note (Signed)
Requesting Physician: Dr. Lucianne Lei    Chief Complaint: acute stroke  History obtained from:  Patient     HPI:                                                                                                                                         [name] is an 76 y.o. female with multiple medical problems including carotid stenosis of 40-59% bilaterally. Patient also has chronic kidney disease, anxiety, arthritis, thrombocytopenia, diabetes, congestive heart failure. Patient states that this past Saturday she noted upon wakening she had some left-sided facial numbness that also extended down to her arm and leg. Due to the symptoms not improving patient came to the emergency department on Monday. 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 This would correlate with the symptoms that she was experiencing. Majority of her symptoms have resolved. Patient states that she does take aspirin on a daily basis. She is concerned about going on Plavix as she does have thrombocytopenia with a platelet count of 95.  Date last known well: Date: 09/24/2016 Time last known well: Unable to determine tPA Given: No: out of window  Past Medical History:  Diagnosis Date  . Anemia    Pt is taking iron.   [name] Anxiety   . Arthritis   . Carotid stenosis    40-59% bilateral ICA stenosis in 2/12.  . Chronic low back pain   . CKD (chronic kidney disease)    Dr. Audie Clear at [facility]  . Coronary artery disease    Pt presented 2/10 to [facility] with NSTEMI and diastolic CHF exacerbation.  LHC was done  3/10 showing 99% pRCA stenosis and 80% calcified pLAD stenosis with L=>R collaterals.  Pt was referred  for CABG which was done by Dr. Prescott Gum with LIMA-LAD, SVG-RCA, SVG-OM.  . Diabetes mellitus   . Diabetic neuropathy ([place])   . Diastolic CHF (HCC)    Echo (2/10) showed EF 55-65%, mild LVH, diastolic dysfunction, mild AS with mean gradient 12 mmHg, PASP 43  mmHg.  Echo (2/12): EF 55-60%, mild LVH, mild AS (mean gradient 12), PA systolic pressure 32 mmHg.     [name] GERD (gastroesophageal reflux disease)   . Heart murmur   . Hyperlipidemia   . Hypertension   . Mild aortic stenosis    mean gradient 12 mmHg in 2/12.  . Myocardial infarction    "mild"  . Pneumonia   . PONV (postoperative nausea and vomiting)   . Thrombocytopenia ([place])   . Unsteady gait     Past Surgical History:  Procedure Laterality Date  . AV FISTULA PLACEMENT Left 01/30/2015   Procedure: Creation of a Radial Cephalic AV Fistula left wrist;  Surgeon: [name], MD;  Location: [place];  Service: Vascular;  Laterality: Left;  . BACK SURGERY  multiple  . BREAST SURGERY     biopsy  . CARDIAC CATHETERIZATION    . CATARACT EXTRACTION W/ INTRAOCULAR LENS  IMPLANT, BILATERAL    . COLONOSCOPY W/ BIOPSIES AND POLYPECTOMY    . CORONARY ARTERY BYPASS GRAFT  09/2008   pt with NSTEMI and diastolic CHF exacerbation.  LHC was done  3/10 showing 99% pRCA stenosis and 80% calcified pLAD stenosis with L=>R collaterals.  Pt was referred  for CABG which was done by Dr. Prescott Gum with LIMA-LAD, SVG-RCA, SVG-OM.  [name] REVISON OF ARTERIOVENOUS FISTULA Left 07/02/2015   Procedure: REVISON OF LEFT RADIOCEPHALIC ARTERIOVENOUS FISTULA;  Surgeon: [name], MD;  Location: [facility];  Service: Vascular;  Laterality: Left;    Family History  Problem Relation Age of Onset  . Adopted: Yes  . Family history unknown: Yes   Social History:  reports that she has quit smoking. Her smoking use included Cigarettes. She has never used smokeless tobacco. She reports that she drinks alcohol. She reports that she does not use drugs.  Allergies:  Allergies  Allergen Reactions  . Lipitor [Atorvastatin] Other (See Comments)    Stomach pain  . Strawberry Extract Rash    Medications:                                                                                                                            Prior to Admission:  Prescriptions Prior to Admission  Medication Sig Dispense Refill Last Dose  . ALPRAZolam (XANAX) 0.5 MG tablet Take 0.5 mg by mouth at bedtime.    09/26/2016 at Unknown time  . amLODipine (NORVASC) 5 MG tablet Take 1 tablet (5 mg total) by mouth daily. 90 tablet 3 09/26/2016 at Unknown time  . aspirin 81 MG tablet Take 81 mg by mouth daily.    09/26/2016 at Unknown time  . calcium acetate (PHOSLO) 667 MG capsule Take 1,334 mg by mouth 3 (three) times daily with meals. As directed   09/25/2016 at Unknown time  . carvedilol (COREG) 12.5 MG tablet TAKE 1 TABLET(12.5 MG) BY MOUTH TWICE DAILY 60 tablet 9 09/26/2016 at 8a  . cholecalciferol (VITAMIN D) 1000 UNITS tablet Take 1,000 Units by mouth daily.   09/26/2016 at Unknown time  . furosemide (LASIX) 20 MG tablet Take 20 mg by mouth daily. As directed   09/26/2016 at Unknown time  . losartan (COZAAR) 25 MG tablet Take 1 tablet (25 mg total) by mouth daily. 90 tablet 3 09/26/2016 at Unknown time  . rosuvastatin (CRESTOR) 5 MG tablet TAKE 1 TABLET(5 MG) BY MOUTH DAILY 90 tablet 3 09/25/2016 at Unknown time  . sodium bicarbonate 650 MG tablet Take 650 mg by mouth 2 (two) times daily.   09/25/2016 at Unknown time  . trimethoprim (TRIMPEX) 100 MG tablet Take 50 mg by mouth daily.    09/25/2016 at Unknown time  . VELTASSA 8.4 G packet Take 8.4 g by mouth daily after lunch.  09/26/2016 at Unknown time   Scheduled: . ALPRAZolam  0.5 mg Oral QHS  . amLODipine  5 mg Oral Daily  . aspirin  325 mg Oral Daily  . calcium acetate  1,334 mg Oral TID WC  . carvedilol  12.5 mg Oral BID WC  . cholecalciferol  1,000 Units Oral Daily  . heparin  5,000 Units Subcutaneous Q8H  . losartan  25 mg Oral Daily  . patiromer  8.4 g Oral QPC lunch  . rosuvastatin  5 mg Oral q1800  . sodium bicarbonate  650 mg Oral BID  . trimethoprim  50 mg Oral Daily    ROS:                                                                                                                                        History obtained from the patient  General ROS: negative for - chills, fatigue, fever, night sweats, weight gain or weight loss Psychological ROS: negative for - behavioral disorder, hallucinations, memory difficulties, mood swings or suicidal ideation Ophthalmic ROS: negative for - blurry vision, double vision, eye pain or loss of vision ENT ROS: negative for - epistaxis, nasal discharge, oral lesions, sore throat, tinnitus or vertigo Allergy and Immunology ROS: negative for - hives or itchy/watery eyes Hematological and Lymphatic ROS: negative for - bleeding problems, bruising or swollen lymph nodes Endocrine ROS: negative for - galactorrhea, hair pattern changes, polydipsia/polyuria or temperature intolerance Respiratory ROS: negative for - cough, hemoptysis, shortness of breath or wheezing Cardiovascular ROS: negative for - chest pain, dyspnea on exertion, edema or irregular heartbeat Gastrointestinal ROS: negative for - abdominal pain, diarrhea, hematemesis, nausea/vomiting or stool incontinence Genito-Urinary ROS: negative for - dysuria, hematuria, incontinence or urinary frequency/urgency Musculoskeletal ROS: negative for - joint swelling or muscular weakness Neurological ROS: as noted in HPI Dermatological ROS: negative for rash and skin lesion changes  Neurologic Examination:                                                                                                      Blood pressure (!) 153/58, pulse 69, temperature 98.4 F (36.9 C), temperature source Oral, resp. rate 16, height 5\' 8"  (1.727 m), weight 61.3 kg (135 lb 3.2 oz), SpO2 100 %.  HEENT-  Normocephalic, no lesions, without obvious abnormality.  Normal external eye and conjunctiva.  Normal TM's bilaterally.  Normal auditory canals and external ears. Normal external nose, mucus membranes and septum.  Normal pharynx. Cardiovascular- S1, S2  normal, pulses palpable throughout   Lungs-  chest clear, no wheezing, rales, normal symmetric air entry Abdomen- normal findings: bowel sounds normal Extremities- no edema Lymph-no adenopathy palpable Musculoskeletal-no joint tenderness, deformity or swelling Skin-warm and dry, no hyperpigmentation, vitiligo, or suspicious lesions  Neurological Examination Mental Status: Alert, oriented, thought content appropriate.  Speech fluent without evidence of aphasia.  Able to follow 3 step commands without difficulty. Cranial Nerves: II: Discs flat bilaterally; Visual fields grossly normal,  III,IV, VI: ptosis not present, extra-ocular motions intact bilaterally, pupils equal, round, reactive to light and accommodation V,VII: smile symmetric, facial light touch sensation slightly decreased on the right VIII: hearing normal bilaterally IX,X: uvula rises symmetrically XI: bilateral shoulder shrug XII: midline tongue extension Motor: Right : Upper extremity   5/5    Left:     Upper extremity   5/5  Lower extremity   5/5     Lower extremity   5/5 Tone and bulk:normal tone throughout; no atrophy noted Sensory: Pinprick and light touch intact throughout, bilaterally Deep Tendon Reflexes: 1+ and symmetric throughout Plantars: Mute bilaterally Cerebellar: normal finger-to-nose,  and normal heel-to-shin test Gait: Not tested       Lab Results: Basic Metabolic Panel:  Recent Labs Lab 09/26/16 1314 09/27/16 0309  NA 139 142  K 4.4 3.7  CL 112* 112*  CO2 18* 19*  GLUCOSE 119* 110*  BUN 42* 42*  CREATININE 3.58* 3.65*  CALCIUM 9.0 8.7*    Liver Function Tests: No results for input(s): AST, ALT, ALKPHOS, BILITOT, PROT, ALBUMIN in the last 168 hours. No results for input(s): LIPASE, AMYLASE in the last 168 hours. No results for input(s): AMMONIA in the last 168 hours.  CBC:  Recent Labs Lab 09/26/16 1314 09/27/16 0309  WBC 5.3 4.2  HGB 9.7* 8.9*  HCT 29.8* 27.4*  MCV 102.1* 100.7*  PLT 82* 95*    Cardiac  Enzymes: No results for input(s): CKTOTAL, CKMB, CKMBINDEX, TROPONINI in the last 168 hours.  Lipid Panel:  Recent Labs Lab 09/27/16 0309  CHOL 99  TRIG 84  HDL 51  CHOLHDL 1.9  VLDL 17  LDLCALC 31    CBG: No results for input(s): GLUCAP in the last 168 hours.  Microbiology: Results for orders placed or performed in visit on 04/14/10  Smear     Status: None   Collection Time: 04/20/10 10:03 AM  Result Value Ref Range Status   Smear Result Smear Available  Final    Coagulation Studies: No results for input(s): LABPROT, INR in the last 72 hours.  Imaging: Dg Chest 2 View  Result Date: 09/26/2016 CLINICAL DATA:  Acute onset of generalized left-sided numbness. Initial encounter. EXAM: CHEST  2 VIEW COMPARISON:  Chest radiograph performed 11/10/2008 FINDINGS: The lungs are well-aerated and clear. There is no evidence of focal opacification, pleural effusion or pneumothorax. The heart is borderline normal in size. The patient is status post median sternotomy, with evidence of prior CABG. No acute osseous abnormalities are seen. IMPRESSION: No acute cardiopulmonary process seen. Electronically Signed   By: Garald Balding M.D.   On: 09/26/2016 21:13   Mr Brain Wo Contrast  Result Date: 09/26/2016 CLINICAL DATA:  Initial evaluation for acute left-sided numbness. EXAM: MRI HEAD WITHOUT CONTRAST TECHNIQUE: Multiplanar, multiecho pulse sequences of the brain and surrounding structures were obtained without intravenous contrast. COMPARISON:  None available. FINDINGS: Brain: Mild diffuse prominence of the CSF containing spaces is compatible with generalized age-related cerebral atrophy. Patchy and confluent T2/FLAIR hyperintensity  within the periventricular and deep white matter both cerebral hemispheres most consistent with chronic microvascular ischemic disease. Chronic microvascular ischemic changes noted within the pons as well. 7 mm focus of restricted diffusion within the right thalamus,  compatible with a small acute ischemic lacunar type infarct. No associated mass effect or hemorrhage. No other area of acute infarction identified. No evidence for acute or chronic intracranial hemorrhage. No mass lesion, midline shift, or mass effect. No hydrocephalus. No extra-axial fluid collection. Major dural sinuses are grossly patent. Pituitary gland and suprasellar region within normal limits. Vascular: Major intracranial vascular flow voids are maintained. Skull and upper cervical spine: Craniocervical junction normal. Visualized upper cervical spine without acute abnormality. Bone marrow signal intensity within normal limits. No scalp soft tissue abnormality. Sinuses/Orbits: Globes and orbital soft tissues within normal limits. Patient is status post cataract extraction bilaterally. Mild scattered mucosal thickening within the ethmoidal air cells and maxillary sinuses. Paranasal sinuses are otherwise clear. Small left mastoid effusion noted. Inner ear structures within normal limits. IMPRESSION: 1. 7 mm acute ischemic lacunar type infarct involving the right thalamus. No associated hemorrhage. 2. Mild age-related cerebral atrophy with chronic microvascular ischemic disease. Electronically Signed   By: Jeannine Boga M.D.   On: 09/26/2016 16:09   Mr Jodene Nam Head/brain YF Cm  Result Date: 09/26/2016 CLINICAL DATA:  LEFT-sided numbness beginning 2 days ago. History of diabetes, end-stage renal disease, hypertension, hyperlipidemia. Follow-up stroke. EXAM: MRA HEAD WITHOUT CONTRAST TECHNIQUE: Angiographic images of the Circle of Willis were obtained using MRA technique without intravenous contrast. COMPARISON:  MRI of the head September 26, 2016 at 1537 hours FINDINGS: ANTERIOR CIRCULATION: Flow related enhancement of the included cervical, petrous, cavernous and supraclinoid internal carotid arteries. Moderate stenosis proximal RIGHT cavernous segment. Mild stenosis bilateral supraclinoid segments.  Patent anterior communicating artery. Normal flow related enhancement of the anterior cerebral arteries. 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 Moderate stenosis RIGHT M2 origin. No large vessel occlusion, abnormal luminal irregularity, aneurysm. POSTERIOR CIRCULATION: LEFT vertebral artery is dominant. RIGHT vertebral artery terminates in the posterior inferior cerebellar artery. Basilar artery is patent, with normal flow related enhancement of the main branch vessels. Flow related enhancement of the posterior cerebral arteries. Small RIGHT, tiny LEFT posterior communicating arteries present. Severe stenosis distal RIGHT P2 segment. No large vessel occlusion, abnormal luminal irregularity, aneurysm. ANATOMIC VARIANTS: Proximal basilar artery fenestration. IMPRESSION: No emergent large vessel occlusion. Severe stenosis RIGHT P2 and LEFT M1 segments, moderate stenosis RIGHT M2 origin compatible with atherosclerosis. Moderate stenosis proximal RIGHT cavernous segment. 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 Electronically Signed   By: Elon Alas M.D.   On: 09/26/2016 22:07       Assessment and plan discussed with with attending physician and they are in agreement.    Etta Quill PA-C [facility] [phone]  09/27/2016, 9:52 AM   Assessment: 76 y.o. female with acute right thalamic infarct causing left-sided decreased sensation. Symptoms have significantly resolved.  Stroke Risk Factors - hyperlipidemia and hypertension  1. HgbA1c, fasting lipid panel 2. MRI, MRA  of the brain without contrast 3. PT consult, OT consult, Speech consult 4. Echocardiogram 5. Carotid dopplers 6. Prophylactic therapy-at this time would remain on 81 mg of aspirin as she does have thrombocytopenia with platelet levels decreasing down into the 80s. She is followed by hematology and would confer with hematology prior to putting play showing  on Plavix. 7. Risk factor modification 8. Telemetry monitoring 9.  Frequent neuro checks 10 NPO until passes stroke swallow screen 11 please page stroke NP  Or  PA  Or MD from 8am -4 pm  as this patient from this time will be  followed by the stroke.   You can look them up on [email or web address]  Password [email or web address]

## 2016-09-27 NOTE — Progress Notes (Signed)
PROGRESS NOTE    GAYL IVANOFF  YYT:[id number] DOB: 1941-02-02 DOA: 09/26/2016 PCP: [name], MD   Outpatient Specialists:    Brief Narrative:  [name] is a 76 y.o. female with medical history significant of CKD, diastolic CHF, HLD, CM, and HTN.  Saturday patient awoke with numbness on left side of face/arms/legs.  She thought is would get better but did not.  She called her cardiologist office this AM and they told her to come to the ER.  Numbness has not worsened nor has it gotten better.  No intervention tried. No SOB, no CP, no fever, no chills. MRI was done that showed 7 mm acute ischemic lacunar type infarct involving the right thalamus. No associated hemorrhage. 2. Mild age-related cerebral atrophy with chronic microvascular ischemic disease.  Patient drinks 3 beers/night.  Never had withdrawal.  Patient also used to have DM but with dietary changes, has lost 25 lbs and has been able to come off medications.       Assessment & Plan:   Active Problems:   Essential hypertension   Chronic kidney disease (CKD), stage IV (severe) (HCC)   CVA (cerebral vascular accident) ([place])   CVA -MRI/MRA: 7 mm acute ischemic lacunar type infarct involving the right thalamus. No associated hemorrhage. 2. Mild age-related cerebral atrophy with chronic microvascular ischemic disease. Severe stenosis RIGHT P2 and LEFT M1 segments, moderate stenosis RIGHT M2 origin compatible with atherosclerosis. Moderate stenosis proximal RIGHT cavernous segment. 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  -echo:focal basal and mild concentric hypertrophy. Systolic function   was normal. The estimated ejection fraction was in the range of   55% to 60%. Wall motion was normal; there were no regional wall   motion abnormalities. Features are consistent with a pseudonormal   left ventricular filling pattern, with concomitant abnormal   relaxation and increased filling  pressure (grade 2 diastolic   dysfunction). - Aortic valve: The AV appears mildly to moderately stenotic by   doppler with a mean AV gradient of 79mmHg. The calculated AVA is   0.9cm2 and likely underestimates with AVA due to inaccurate   measurment of the LVOT. Visually there appears to be mild to   moderate AS. Valve area (VTI): 0.9 cm^2. Valve area (Vmax): 0.89   cm^2. Valve area (Vmean): 0.86 cm^2. LDL < 70-- continue statin HgbA1C pending -carotid: Severe amount of plaque are subjectively suggestive of a 40-59% percent stenosis bilaterally.  The right vertebral demonstrates atypical flow, left vertebral is patent and antegrade.  CKD IV -follows closely with nephrology  HTN -resume home meds  Alcohol use -no sign of withdrawal  Anemia/thrombocytopenia -outpatient follow up with Dr. Osker Mason   DVT prophylaxis:  ASA  Code Status: Full Code   Family Communication:   Disposition Plan:     Consultants:  neuro  Subjective: Feeling better  Objective: Vitals:   09/27/16 0408 09/27/16 0600 09/27/16 0818 09/27/16 1325  BP: (!) 128/56 115/60 (!) 153/58 (!) 131/41  Pulse: 64 64 69 67  Resp: 16 16 16 16   Temp:  97.8 F (36.6 C) 98.4 F (36.9 C) 98.4 F (36.9 C)  TempSrc:  Oral Oral Oral  SpO2: 97% 96% 100% 98%  Weight:      Height:        Intake/Output Summary (Last 24 hours) at 09/27/16 1417 Last data filed at 09/27/16 1325  Gross per 24 hour  Intake  720 ml  Output                0 ml  Net              720 ml   Filed Weights   09/27/16 0022  Weight: 61.3 kg (135 lb 3.2 oz)    Examination:  General exam: Appears calm and comfortable  Respiratory system: Clear to auscultation. Respiratory effort normal. Cardiovascular system: S1 & S2 heard, RRR. No JVD, murmurs, rubs, gallops or clicks. No pedal edema. Gastrointestinal system: Abdomen is nondistended, soft and nontender. No organomegaly or masses felt. Normal bowel sounds heard. Central  nervous system: Alert and oriented. No focal neurological deficits.     Data Reviewed: I have personally reviewed following labs and imaging studies  CBC:  Recent Labs Lab 09/26/16 1314 09/27/16 0309  WBC 5.3 4.2  HGB 9.7* 8.9*  HCT 29.8* 27.4*  MCV 102.1* 100.7*  PLT 82* 95*   Basic Metabolic Panel:  Recent Labs Lab 09/26/16 1314 09/27/16 0309  NA 139 142  K 4.4 3.7  CL 112* 112*  CO2 18* 19*  GLUCOSE 119* 110*  BUN 42* 42*  CREATININE 3.58* 3.65*  CALCIUM 9.0 8.7*   GFR: Estimated Creatinine Clearance: 13.1 mL/min (by C-G formula based on SCr of 3.65 mg/dL (H)). Liver Function Tests: No results for input(s): AST, ALT, ALKPHOS, BILITOT, PROT, ALBUMIN in the last 168 hours. No results for input(s): LIPASE, AMYLASE in the last 168 hours. No results for input(s): AMMONIA in the last 168 hours. Coagulation Profile: No results for input(s): INR, PROTIME in the last 168 hours. Cardiac Enzymes: No results for input(s): CKTOTAL, CKMB, CKMBINDEX, TROPONINI in the last 168 hours. BNP (last 3 results) No results for input(s): PROBNP in the last 8760 hours. HbA1C: No results for input(s): HGBA1C in the last 72 hours. CBG: No results for input(s): GLUCAP in the last 168 hours. Lipid Profile:  Recent Labs  09/27/16 0309  CHOL 99  HDL 51  LDLCALC 31  TRIG 84  CHOLHDL 1.9   Thyroid Function Tests: No results for input(s): TSH, T4TOTAL, FREET4, T3FREE, THYROIDAB in the last 72 hours. Anemia Panel: No results for input(s): VITAMINB12, FOLATE, FERRITIN, TIBC, IRON, RETICCTPCT in the last 72 hours. Urine analysis:    Component Value Date/Time   COLORURINE YELLOW 09/26/2016 1744   APPEARANCEUR TURBID (A) 09/26/2016 1744   LABSPEC 1.005 09/26/2016 1744   PHURINE 5.0 09/26/2016 1744   GLUCOSEU NEGATIVE 09/26/2016 1744   HGBUR SMALL (A) 09/26/2016 1744   BILIRUBINUR NEGATIVE 09/26/2016 1744   KETONESUR NEGATIVE 09/26/2016 1744   PROTEINUR 100 (A) 09/26/2016 1744     UROBILINOGEN 0.2 10/14/2008 0612   NITRITE NEGATIVE 09/26/2016 1744   LEUKOCYTESUR LARGE (A) 09/26/2016 1744    )No results found for this or any previous visit (from the past 240 hour(s)).    Anti-infectives    Start     Dose/Rate Route Frequency Ordered Stop   09/26/16 2100  trimethoprim (TRIMPEX) tablet 50 mg     50 mg Oral Daily 09/26/16 2014         Radiology Studies: Dg Chest 2 View  Result Date: 09/26/2016 CLINICAL DATA:  Acute onset of generalized left-sided numbness. Initial encounter. EXAM: CHEST  2 VIEW COMPARISON:  Chest radiograph performed 11/10/2008 FINDINGS: The lungs are well-aerated and clear. There is no evidence of focal opacification, pleural effusion or pneumothorax. The heart is borderline normal in size. The patient is status post median  sternotomy, with evidence of prior CABG. No acute osseous abnormalities are seen. IMPRESSION: No acute cardiopulmonary process seen. Electronically Signed   By: Garald Balding M.D.   On: 09/26/2016 21:13   Mr Brain Wo Contrast  Result Date: 09/26/2016 CLINICAL DATA:  Initial evaluation for acute left-sided numbness. EXAM: MRI HEAD WITHOUT CONTRAST TECHNIQUE: Multiplanar, multiecho pulse sequences of the brain and surrounding structures were obtained without intravenous contrast. COMPARISON:  None available. FINDINGS: Brain: Mild diffuse prominence of the CSF containing spaces is compatible with generalized age-related cerebral atrophy. Patchy and confluent T2/FLAIR hyperintensity within the periventricular and deep white matter both cerebral hemispheres most consistent with chronic microvascular ischemic disease. Chronic microvascular ischemic changes noted within the pons as well. 7 mm focus of restricted diffusion within the right thalamus, compatible with a small acute ischemic lacunar type infarct. No associated mass effect or hemorrhage. No other area of acute infarction identified. No evidence for acute or chronic intracranial  hemorrhage. No mass lesion, midline shift, or mass effect. No hydrocephalus. No extra-axial fluid collection. Major dural sinuses are grossly patent. Pituitary gland and suprasellar region within normal limits. Vascular: Major intracranial vascular flow voids are maintained. Skull and upper cervical spine: Craniocervical junction normal. Visualized upper cervical spine without acute abnormality. Bone marrow signal intensity within normal limits. No scalp soft tissue abnormality. Sinuses/Orbits: Globes and orbital soft tissues within normal limits. Patient is status post cataract extraction bilaterally. Mild scattered mucosal thickening within the ethmoidal air cells and maxillary sinuses. Paranasal sinuses are otherwise clear. Small left mastoid effusion noted. Inner ear structures within normal limits. IMPRESSION: 1. 7 mm acute ischemic lacunar type infarct involving the right thalamus. No associated hemorrhage. 2. Mild age-related cerebral atrophy with chronic microvascular ischemic disease. Electronically Signed   By: Jeannine Boga M.D.   On: 09/26/2016 16:09   Mr Jodene Nam Head/brain ST Cm  Result Date: 09/26/2016 CLINICAL DATA:  LEFT-sided numbness beginning 2 days ago. History of diabetes, end-stage renal disease, hypertension, hyperlipidemia. Follow-up stroke. EXAM: MRA HEAD WITHOUT CONTRAST TECHNIQUE: Angiographic images of the Circle of Willis were obtained using MRA technique without intravenous contrast. COMPARISON:  MRI of the head September 26, 2016 at 1537 hours FINDINGS: ANTERIOR CIRCULATION: Flow related enhancement of the included cervical, petrous, cavernous and supraclinoid internal carotid arteries. Moderate stenosis proximal RIGHT cavernous segment. Mild stenosis bilateral supraclinoid segments. Patent anterior communicating artery. Normal flow related enhancement of the anterior cerebral arteries. 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 Moderate  stenosis RIGHT M2 origin. No large vessel occlusion, abnormal luminal irregularity, aneurysm. POSTERIOR CIRCULATION: LEFT vertebral artery is dominant. RIGHT vertebral artery terminates in the posterior inferior cerebellar artery. Basilar artery is patent, with normal flow related enhancement of the main branch vessels. Flow related enhancement of the posterior cerebral arteries. Small RIGHT, tiny LEFT posterior communicating arteries present. Severe stenosis distal RIGHT P2 segment. No large vessel occlusion, abnormal luminal irregularity, aneurysm. ANATOMIC VARIANTS: Proximal basilar artery fenestration. IMPRESSION: No emergent large vessel occlusion. Severe stenosis RIGHT P2 and LEFT M1 segments, moderate stenosis RIGHT M2 origin compatible with atherosclerosis. Moderate stenosis proximal RIGHT cavernous segment. 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 Electronically Signed   By: Elon Alas M.D.   On: 09/26/2016 22:07        Scheduled Meds: . ALPRAZolam  0.5 mg Oral QHS  . amLODipine  5 mg Oral Daily  . aspirin  325 mg Oral Daily  . calcium acetate  1,334 mg  Oral TID WC  . carvedilol  12.5 mg Oral BID WC  . cholecalciferol  1,000 Units Oral Daily  . heparin  5,000 Units Subcutaneous Q8H  . losartan  25 mg Oral Daily  . patiromer  8.4 g Oral QPC lunch  . rosuvastatin  5 mg Oral q1800  . sodium bicarbonate  650 mg Oral BID  . trimethoprim  50 mg Oral Daily   Continuous Infusions:   LOS: 0 days    Time spent: 25 min    Walters, DO [facility] Pager [phone]  If 7PM-7AM, please contact night-coverage [email or web address] Password [email or web address] 09/27/2016, 2:17 PM

## 2016-09-27 NOTE — Care Management Note (Signed)
Case Management Note  Patient Details  Name: [name] MRN: [id number] Date of Birth: 1941-01-28  Subjective/Objective:                    Action/Plan: Pt discharging home with self care. No f/u per PT/OT and no DME needs. Pt has insurance and PCP. No further needs per CM.   Expected Discharge Date:  09/27/16               Expected Discharge Plan:  Home/Self Care  In-House Referral:     Discharge planning Services     Post Acute Care Choice:    Choice offered to:     DME Arranged:    DME Agency:     HH Arranged:    HH Agency:     Status of Service:  Completed, signed off  If discussed at [employer] of Stay Meetings, dates discussed:    Additional Comments:  Pollie Friar, RN 09/27/2016, 2:54 PM

## 2016-09-27 NOTE — Progress Notes (Signed)
  Echocardiogram 2D Echocardiogram has been performed.  Darlina Sicilian M 09/27/2016, 9:57 AM

## 2016-09-27 NOTE — Progress Notes (Signed)
Discharge instructions reviewed with patient/family. All questions answered at this time. Transport home per family.  Ave Filter, RN

## 2016-09-27 NOTE — Evaluation (Signed)
Occupational Therapy Evaluation Patient Details Name: [name] MRN: [id number] DOB: [birth year] Today's Date: 09/27/2016    History of Present Illness is a 76 y.o. female with medical history significant of CKD, diastolic CHF, HLD, CM, and HTN.  Saturday patient awoke with numbness on left side of face/arms/legs   Clinical Impression   PTA, pt was independent with assistive devices for ADL and functional mobility. She reports that she does not use cane in her home but does for community mobility. Pt currently requires supervision for standing grooming tasks and min guard assist for toilet transfers. She reports that she is performing functional mobility at baseline. She continues to report L UE numbness but does have light touch and proprioception in tact. Educated pt on protection of her L UE due to sensation loss and provided handout. Pt and husband demonstrate understanding. Noted slightly decreased fine motor coordination in her L hand but per pt report, this is her baseline. Pt would benefit from continued OT services while admitted to improve independence with ADL and functional mobility. No OT follow-up recommended post-acute D/C. OT will continue to follow acutely.    Follow Up Recommendations  No OT follow up;Supervision/Assistance - 24 hour    Equipment Recommendations  None recommended by OT    Recommendations for Other Services       Precautions / Restrictions Precautions Precautions: Fall Precaution Comments: remote h/o falls Restrictions Weight Bearing Restrictions: No      Mobility Bed Mobility Overal bed mobility: Independent             General bed mobility comments: OOB in chair on OT arrival  Transfers Overall transfer level: Needs assistance Equipment used: None Transfers: Sit to/from Stand Sit to Stand: Min guard         General transfer comment: Min guard for safety from recliner as pt slightly unsteady.    Balance Overall balance  assessment: Needs assistance Sitting-balance support: No upper extremity supported;Feet supported Sitting balance-Leahy Scale: Good     Standing balance support: No upper extremity supported Standing balance-Leahy Scale: Fair Standing balance comment: Reaching for furniture noted during OT evaluation during dynamic activity.                            ADL Overall ADL's : Needs assistance/impaired Eating/Feeding: Set up;Sitting   Grooming: Supervision/safety;Standing   Upper Body Bathing: Set up;Sitting   Lower Body Bathing: Supervison/ safety;Sit to/from stand Lower Body Bathing Details (indicate cue type and reason): Min guard for sit<>stand Upper Body Dressing : Set up;Sitting   Lower Body Dressing: Supervision/safety;Sit to/from stand Lower Body Dressing Details (indicate cue type and reason): Min guard for sit<>stand Toilet Transfer: Ambulation;Regular Toilet;Min guard   Toileting- Clothing Manipulation and Hygiene: Supervision/safety;Sit to/from stand       Functional mobility during ADLs: Supervision/safety General ADL Comments: Supervision for safety during standing ADL tasks due to slight instability while ambulating.     Vision Vision Assessment?: Yes Eye Alignment: Within Functional Limits Ocular Range of Motion: Within Functional Limits Alignment/Gaze Preference: Within Defined Limits Tracking/Visual Pursuits: Able to track stimulus in all quads without difficulty Saccades: Within functional limits Visual Fields: No apparent deficits Additional Comments: Able to functionally use central and peripheral vision during ADL tasks.   Perception     Praxis      Pertinent Vitals/Pain Pain Assessment: No/denies pain     Hand Dominance Right   Extremity/Trunk Assessment Upper Extremity Assessment Upper Extremity  Assessment: Generalized weakness;LUE deficits/detail LUE Deficits / Details: Reports feelings of numbness. Proprioception and light touch  in tact. LUE Sensation: history of peripheral neuropathy LUE Coordination: decreased fine motor   Lower Extremity Assessment Lower Extremity Assessment: Defer to PT evaluation   Cervical / Trunk Assessment Cervical / Trunk Assessment: Normal   Communication Communication Communication: HOH   Cognition Arousal/Alertness: Awake/alert Behavior During Therapy: WFL for tasks assessed/performed Overall Cognitive Status: Within Functional Limits for tasks assessed                     General Comments       Exercises       Shoulder Instructions      Home Living Family/patient expects to be discharged to:: Private residence Living Arrangements: Spouse/significant other Available Help at Discharge: Family;Available 24 hours/day Type of Home: House Home Access: Stairs to enter [employer] of Steps: 1 Entrance Stairs-Rails: None Home Layout: One level         Biochemist, clinical: Standard     Home Equipment: Mining engineer - 2 wheels   Additional Comments: she does not use assist device within her home      Prior Functioning/Environment Level of Independence: Independent with assistive device(s)        Comments: uses cane or holds to her husband for balance when out in community, shops while holding to cart        OT Problem List: Decreased strength;Decreased range of motion;Impaired balance (sitting and/or standing);Decreased coordination;Pain;Impaired UE functional use;Impaired sensation   OT Treatment/Interventions: Self-care/ADL training;Therapeutic exercise;Energy conservation;DME and/or AE instruction;Therapeutic activities;Patient/family education;Balance training    OT Goals(Current goals can be found in the care plan section) Acute Rehab OT Goals Patient Stated Goal: to go home and get back to her dog OT Goal Formulation: With patient/family Time For Goal Achievement: 10/11/16 Potential to Achieve Goals: Good ADL Goals Pt Will Perform  Tub/Shower Transfer: with modified independence;ambulating;grab bars;Tub transfer Pt/caregiver will Perform Home Exercise Program: Left upper extremity;With written HEP provided;Independently (L UE fine motor coordination) Additional ADL Goal #1: Pt will identify and implement 3 strategies to compensate for loss of sensation in L UE in order to improve safety with ADL.  OT Frequency: Min 1X/week   Barriers to D/C:            Co-evaluation              End of Session Nurse Communication: Mobility status  Activity Tolerance: Patient tolerated treatment well Patient left: in chair;with call bell/phone within reach;with family/visitor present;with chair alarm set   Time: [phone] OT Time Calculation (min): 26 min Charges:  OT General Charges $OT Visit: 1 Procedure OT Evaluation $OT Eval Moderate Complexity: 1 Procedure OT Treatments $Self Care/Home Management : 8-22 mins G-Codes: OT G-codes **NOT FOR INPATIENT CLASS** Functional Assessment Tool Used: clinical judgement Functional Limitation: Self care Self Care Current Status (F7510): At least 1 percent but less than 20 percent impaired, limited or restricted Self Care Goal Status (C5852): 0 percent impaired, limited or restricted  Norman Herrlich, OTR/L [phone] 09/27/2016, 1:28 PM

## 2016-09-27 NOTE — Progress Notes (Signed)
*  PRELIMINARY RESULTS* Vascular Ultrasound Carotid Duplex (Doppler) has been completed.  Preliminary findings: consistent with a high end 1- 39 percent stenosis involving the right internal carotid artery and the left internal carotid artery. Can not rule out higher grade stenosis due to severe amount of shadowing plaque in bilateral carotid arteries and difficulty visualizing vessels.  Severe amount of plaque are subjectively suggestive of a 40-59% percent stenosis bilaterally.  The right vertebral demonstrates atypical flow, left vertebral is patent and antegrade.  Everrett Coombe 09/27/2016, 12:06 PM

## 2016-09-27 NOTE — Evaluation (Signed)
Physical Therapy Evaluation Patient Details Name: [name] MRN: [id number] DOB: Apr 08, 1941 Today's Date: 09/27/2016   History of Present Illness  is a 76 y.o. female with medical history significant of CKD, diastolic CHF, HLD, CM, and HTN.  Saturday patient awoke with numbness on left side of face/arms/legs  Clinical Impression  Pt reports she is near baseline performance for mobility.  She has good family support at home.  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  Will continue to follow patient while on this venue of care to progress mobility    Follow Up Recommendations No PT follow up;Supervision - Intermittent    Equipment Recommendations  None recommended by PT    Recommendations for Other Services       Precautions / Restrictions Precautions Precautions: Fall Precaution Comments: remote h/o falls Restrictions Weight Bearing Restrictions: No      Mobility  Bed Mobility Overal bed mobility: Independent             General bed mobility comments: supine to/from sit without rail, bed flat  Transfers Overall transfer level: Needs assistance Equipment used: None Transfers: Sit to/from Stand Sit to Stand: Min assist         General transfer comment: min assist from toilet, used bar to assist with transfer, supervision sit to stand from bed  Ambulation/Gait Ambulation/Gait assistance: Min assist Ambulation Distance (Feet): 175 Feet Assistive device: 1 person hand held assist Gait Pattern/deviations: Step-through pattern;Decreased dorsiflexion - right;Decreased dorsiflexion - left Gait velocity: 20' in 18.03 sec = 1.11 ft/sec Gait velocity interpretation: <1.8 ft/sec, indicative of risk for recurrent falls General Gait Details: left hand held assist, decr bil step length, pt reports she is near baseline performance for mobility  Stairs            Wheelchair  Mobility    Modified Rankin (Stroke Patients Only) Modified Rankin (Stroke Patients Only) Pre-Morbid Rankin Score: No significant disability Modified Rankin: No significant disability     Balance Overall balance assessment: Needs assistance Sitting-balance support: No upper extremity supported;Feet supported Sitting balance-Leahy Scale: Good     Standing balance support: No upper extremity supported Standing balance-Leahy Scale: Fair Standing balance comment: assist for balance while self managing clothing after toileting for safety                             Pertinent Vitals/Pain Pain Assessment: No/denies pain    Home Living Family/patient expects to be discharged to:: Private residence Living Arrangements: Spouse/significant other Available Help at Discharge: Family;Available 24 hours/day Type of Home: House Home Access: Stairs to enter Entrance Stairs-Rails: None Entrance Stairs-Number of Steps: 1 Home Layout: One level Home Equipment: Mining engineer - 2 wheels Additional Comments: she does not use assist device within her home    Prior Function Level of Independence: Independent with assistive device(s)         Comments: uses cane or holds to her husband for balance when out in community, shops while holding to cart     Hand Dominance   Dominant Hand: Right    Extremity/Trunk Assessment   Upper Extremity Assessment Upper Extremity Assessment: Overall WFL for tasks assessed LUE Deficits / Details: Reports feelings of numbness. Proprioception and light touch in tact.  LUE Sensation: history of peripheral neuropathy LUE Coordination: decreased fine motor    Lower Extremity Assessment Lower Extremity  Assessment: Generalized weakness (prior h/o of bil neuropathy in feet)    Cervical / Trunk Assessment Cervical / Trunk Assessment: Normal  Communication   Communication: HOH  Cognition Arousal/Alertness: Awake/alert Behavior During  Therapy: WFL for tasks assessed/performed Overall Cognitive Status: Within Functional Limits for tasks assessed                      General Comments General comments (skin integrity, edema, etc.): Pt's husband present during examination    Exercises     Assessment/Plan    PT Assessment Patient needs continued PT services  PT Problem List Decreased strength;Decreased balance;Decreased mobility;Impaired sensation          PT Treatment Interventions DME instruction;Gait training;Stair training;Functional mobility training;Therapeutic activities;Therapeutic exercise;Balance training;Neuromuscular re-education;Patient/family education    PT Goals (Current goals can be found in the Care Plan section)  Acute Rehab PT Goals Patient Stated Goal: go home, spend time with my (14#) dog PT Goal Formulation: With patient/family Time For Goal Achievement: 10/07/16 Potential to Achieve Goals: Good    Frequency Min 4X/week   Barriers to discharge        Co-evaluation               End of Session Equipment Utilized During Treatment: Gait belt Activity Tolerance: Patient tolerated treatment well Patient left: in bed;with call bell/phone within reach;with bed alarm set Nurse Communication: Mobility status    Functional Assessment Tool Used: gait velocity, gait Functional Limitation: Mobility: Walking and moving around Mobility: Walking and Moving Around Current Status (V7793): At least 20 percent but less than 40 percent impaired, limited or restricted Mobility: Walking and Moving Around Goal Status [phone]): At least 20 percent but less than 40 percent impaired, limited or restricted    Time: 1020-1035 PT Time Calculation (min) (ACUTE ONLY): 15 min   Charges:   PT Evaluation $PT Eval Low Complexity: 1 Procedure     PT G Codes:   PT G-Codes **NOT FOR INPATIENT CLASS** Functional Assessment Tool Used: gait velocity, gait Functional Limitation: Mobility: Walking and  moving around Mobility: Walking and Moving Around Current Status (P2330): At least 20 percent but less than 40 percent impaired, limited or restricted Mobility: Walking and Moving Around Goal Status [phone]): At least 20 percent but less than 40 percent impaired, limited or restricted   Malka So, [place] [phone]  [place] 09/27/2016, 12:44 PM

## 2016-09-28 LAB — VAS US CAROTID
LCCAPSYS: 76 cm/s
LEFT ECA DIAS: -16 cm/s
LEFT VERTEBRAL DIAS: -17 cm/s
Left CCA dist dias: -24 cm/s
Left CCA dist sys: -179 cm/s
Left CCA prox dias: 11 cm/s
Left ICA dist dias: -31 cm/s
Left ICA dist sys: -97 cm/s
Left ICA prox dias: 25 cm/s
Left ICA prox sys: 113 cm/s
RCCADSYS: -80 cm/s
RCCAPDIAS: 11 cm/s
RIGHT ECA DIAS: -11 cm/s
RIGHT VERTEBRAL DIAS: -8 cm/s
Right CCA prox sys: 80 cm/s

## 2016-09-28 LAB — HEMOGLOBIN A1C
Hgb A1c MFr Bld: 5.4 % (ref 4.8–5.6)
MEAN PLASMA GLUCOSE: 108 mg/dL

## 2016-09-30 ENCOUNTER — Other Ambulatory Visit: Payer: Self-pay | Admitting: *Deleted

## 2016-09-30 NOTE — Patient Outreach (Signed)
[facility]) Care Management  09/30/2016  [name] 31-Jan-1941 [id number]  EMMI-stroke referral with [facility] for scheduled follow up appointment.  Per chart review patient had hospital encounter (observation status) from 02/12-02/13/2018.   Telephone call to patient who advised of reason for call. Patient voices that she had appointment with [place] with nurse practitioner set for April for heart follow up  and appointment with hematologist for next week.   Patient was referred back to discharge instructions &  reviewed instruction to set up appointment with primary care provider for 1 week after discharge from hospital . Patient was advised of importance of hospital follow up. Patient voices understanding & plans to make appointment for follow up.   Patient voices that she is taking medications as prescribed by MD and no trouble getting medications. States only change in medications were increase in ASA dosage to 325 mg daily.   Review of stroke symptoms with patient completed. Voices that she will call 911 if having symptoms. States numbness to her left side has not completely resolved. States feels the same now as when discharged from hospital. States she does not have to use assistive device to walk. Advised patient of falls prevention.   Patient states she has Stroke education booklet and is reading it. States does not need further literature.  No concerns voiced at this time.  EMMI-call completed.   Plan: Send to care management assistant to close case.   Sherrin Daisy, RN BSN [place] Management Coordinator [facility] Care Management  [phone]

## 2016-10-04 ENCOUNTER — Other Ambulatory Visit: Payer: Self-pay | Admitting: Oncology

## 2016-10-04 DIAGNOSIS — D509 Iron deficiency anemia, unspecified: Secondary | ICD-10-CM

## 2016-10-05 ENCOUNTER — Telehealth: Payer: Self-pay | Admitting: Oncology

## 2016-10-05 ENCOUNTER — Ambulatory Visit (HOSPITAL_BASED_OUTPATIENT_CLINIC_OR_DEPARTMENT_OTHER): Payer: Medicare HMO

## 2016-10-05 ENCOUNTER — Ambulatory Visit (HOSPITAL_BASED_OUTPATIENT_CLINIC_OR_DEPARTMENT_OTHER): Payer: Medicare HMO | Admitting: Oncology

## 2016-10-05 ENCOUNTER — Other Ambulatory Visit (HOSPITAL_BASED_OUTPATIENT_CLINIC_OR_DEPARTMENT_OTHER): Payer: Medicare HMO

## 2016-10-05 VITALS — BP 124/44 | HR 60 | Temp 97.8°F | Resp 17 | Ht 68.0 in | Wt 133.0 lb

## 2016-10-05 DIAGNOSIS — D696 Thrombocytopenia, unspecified: Secondary | ICD-10-CM

## 2016-10-05 DIAGNOSIS — D509 Iron deficiency anemia, unspecified: Secondary | ICD-10-CM

## 2016-10-05 DIAGNOSIS — I639 Cerebral infarction, unspecified: Secondary | ICD-10-CM | POA: Diagnosis not present

## 2016-10-05 DIAGNOSIS — N184 Chronic kidney disease, stage 4 (severe): Secondary | ICD-10-CM

## 2016-10-05 DIAGNOSIS — N189 Chronic kidney disease, unspecified: Secondary | ICD-10-CM

## 2016-10-05 DIAGNOSIS — D631 Anemia in chronic kidney disease: Secondary | ICD-10-CM

## 2016-10-05 LAB — CBC WITH DIFFERENTIAL/PLATELET
BASO%: 0.2 % (ref 0.0–2.0)
Basophils Absolute: 0 10*3/uL (ref 0.0–0.1)
EOS ABS: 0.2 10*3/uL (ref 0.0–0.5)
EOS%: 3.8 % (ref 0.0–7.0)
HEMATOCRIT: 27.7 % — AB (ref 34.8–46.6)
HEMOGLOBIN: 9 g/dL — AB (ref 11.6–15.9)
LYMPH#: 1.2 10*3/uL (ref 0.9–3.3)
LYMPH%: 25.4 % (ref 14.0–49.7)
MCH: 32.3 pg (ref 25.1–34.0)
MCHC: 32.5 g/dL (ref 31.5–36.0)
MCV: 99.3 fL (ref 79.5–101.0)
MONO#: 0.6 10*3/uL (ref 0.1–0.9)
MONO%: 12.1 % (ref 0.0–14.0)
NEUT#: 2.8 10*3/uL (ref 1.5–6.5)
NEUT%: 58.5 % (ref 38.4–76.8)
PLATELETS: 86 10*3/uL — AB (ref 145–400)
RBC: 2.79 10*6/uL — ABNORMAL LOW (ref 3.70–5.45)
RDW: 13.5 % (ref 11.2–14.5)
WBC: 4.7 10*3/uL (ref 3.9–10.3)

## 2016-10-05 MED ORDER — DARBEPOETIN ALFA 300 MCG/0.6ML IJ SOSY
300.0000 ug | PREFILLED_SYRINGE | Freq: Once | INTRAMUSCULAR | Status: AC
  Administered 2016-10-05: 300 ug via SUBCUTANEOUS
  Filled 2016-10-05: qty 0.6

## 2016-10-05 NOTE — Progress Notes (Signed)
Hematology and Oncology Follow Up Visit  [name] [id number] 21-Nov-1940 76 y.o. 10/05/2016 9:56 AM [name], MDWilson, Jeanne Flavors, MD   Principle Diagnosis: 76 year old female with anemia of renal disease diagnosed in 2011. She has chronic renal insufficiency but has not required hemodialysis.  Current therapy: Aranesp 300 mcg subcutaneously every 3 weeks keeping hemoglobin above 10.   Interim History:  [name] presents today for a followup visit with her husband. Since the last visit, she was hospitalized briefly in February 2018 for an acute CVA after presenting with left-sided numbness. MRI did show a 7 mm acute ischemic lacunar infarct in the right thalamus. No associated hemorrhage at that time. She was discharged on 09/27/2016 to follow-up with vascular surgery to see [place] her carotid disease that showed severe stenosis.  She does not report any residual weakness associated with this episode. She continues to have persistent numbness in her upper and lower extremity. She did not report any dysphagia or difficulty with speech. She is able to ambulate without any major difficulties. Has not reported any falls or syncope.  She continues to receive Aranesp without any major complications. Her performance status and activity level remain at baseline.  She has not reported any headaches or blurry vision or double vision. She does not report any chest pain shortness of breath or difficulty breathing. She does not report any nausea or vomiting or abdominal pain. She does not report any genitourinary complaints. She has not reported any bleeding or clotting tendencies. She has not reported any lymphadenopathy or petechiae. Rest of her review of systems unremarkable.  Medications: I have reviewed the patient's current medications.  Current Outpatient Prescriptions  Medication Sig Dispense Refill  . ALPRAZolam (XANAX) 0.5 MG tablet Take 0.5 mg by mouth at bedtime.     [name] amLODipine  (NORVASC) 5 MG tablet Take 1 tablet (5 mg total) by mouth daily. 90 tablet 3  . aspirin 325 MG tablet Take 1 tablet (325 mg total) by mouth daily. 30 tablet   . calcium acetate (PHOSLO) 667 MG capsule Take 1,334 mg by mouth 3 (three) times daily with meals. As directed    . carvedilol (COREG) 12.5 MG tablet TAKE 1 TABLET(12.5 MG) BY MOUTH TWICE DAILY 60 tablet 9  . cholecalciferol (VITAMIN D) 1000 UNITS tablet Take 1,000 Units by mouth daily.    . furosemide (LASIX) 20 MG tablet Take 20 mg by mouth daily. As directed    . losartan (COZAAR) 25 MG tablet Take 1 tablet (25 mg total) by mouth daily. 90 tablet 3  . rosuvastatin (CRESTOR) 5 MG tablet TAKE 1 TABLET(5 MG) BY MOUTH DAILY 90 tablet 3  . sodium bicarbonate 650 MG tablet Take 650 mg by mouth 2 (two) times daily.    [name] trimethoprim (TRIMPEX) 100 MG tablet Take 50 mg by mouth daily.     . VELTASSA 8.4 G packet Take 8.4 g by mouth daily after lunch.      No current facility-administered medications for this visit.    Facility-Administered Medications Ordered in Other Visits  Medication Dose Route Frequency Provider Last Rate Last Dose  . Darbepoetin Alfa (ARANESP) injection 300 mcg  300 mcg Subcutaneous Once Wyatt Portela, MD         Allergies:  Allergies  Allergen Reactions  . Lipitor [Atorvastatin] Other (See Comments)    Stomach pain  . Strawberry Extract Rash    Past Medical History, Surgical history, Social history, and Family History were reviewed and  updated.    Physical Exam: Blood pressure (!) 124/44, pulse 60, temperature 97.8 F (36.6 C), temperature source Oral, resp. rate 17, height 5\' 8"  (1.727 m), weight 133 lb (60.3 kg), SpO2 100 %.   ECOG: 1 General appearance: Alert, awake woman without distress. Head: Normocephalic, without obvious abnormality no oral ulcers or lesions. Neck: no adenopathy and no carotid bruit no thyroid masses. Lymph nodes: Cervical, supraclavicular, and axillary nodes  normal. Heart:regular rate and rhythm, S1, S2 normal, no murmur, click, rub or gallop Lung:chest clear, no wheezing, rales, normal symmetric air entry.  Abdomen: soft, non-tender, without masses or organomegaly no rebound or guarding. EXT:no erythema, induration, or nodules   Lab Results: Lab Results  Component Value Date   WBC 4.7 10/05/2016   HGB 9.0 (L) 10/05/2016   HCT 27.7 (L) 10/05/2016   MCV 99.3 10/05/2016   PLT 86 (L) 10/05/2016     Chemistry      Component Value Date/Time   NA 142 09/27/2016 0309   NA 136 08/03/2016 1006   K 3.7 09/27/2016 0309   K 5.0 08/03/2016 1006   CL 112 (H) 09/27/2016 0309   CL 101 08/16/2012 0757   CO2 19 (L) 09/27/2016 0309   CO2 20 (L) 08/03/2016 1006   BUN 42 (H) 09/27/2016 0309   BUN 44.9 (H) 08/03/2016 1006   CREATININE 3.65 (H) 09/27/2016 0309   CREATININE 3.6 (HH) 08/03/2016 1006      Component Value Date/Time   CALCIUM 8.7 (L) 09/27/2016 0309   CALCIUM 9.3 08/03/2016 1006   ALKPHOS 66 08/03/2016 1006   AST 19 08/03/2016 1006   ALT 15 08/03/2016 1006   BILITOT 0.43 08/03/2016 1006      Results for [name] (MRN [id number]) as of 10/05/2016 09:55  Ref. Range 08/03/2016 10:06  Iron Latest Ref Range: 41 - 142 ug/dL 110  UIBC Latest Ref Range: 120 - 384 ug/dL 224  TIBC Latest Ref Range: 236 - 444 ug/dL 334  %SAT Latest Ref Range: 21 - 57 % 33  Ferritin Latest Ref Range: 9 - 269 ng/ml 22     Impression and Plan:   76 year old female with the following issues:   1. Anemia of renal disease. She is currently on Aranesp 300 g every 3 weeks to keep her hemoglobin above 10.   Her iron studies obtained on 08/03/2016 showed no evidence of iron deficiency that required supplementation.  Her hemoglobin today is 9.0 and will require Aranesp therapy. The plan is to continue with this every 3-4 weeks to keep her hemoglobin above 10.  2. Thrombocytopenia: Chronic in nature which have been fluctuating between 80-100 for last 10  years. She has no signs or symptoms of bleeding we'll continue to monitor. 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  3. Renal failure: Her creatinine clearance is close to 10 mL/m but have not received dialysis at this time.  4.  CVA: She does have carotid disease and she will follow with vascular surgery.  5. Follow-up: CBC and Aranesp injection every 3 weeks. Follow-up with a clinical visit will be in May 2018.      GDJMEQ,ASTMH 2/21/20189:56 AM

## 2016-10-05 NOTE — Telephone Encounter (Signed)
Gave patient avs report and appointments for March thru May. °

## 2016-10-05 NOTE — Patient Instructions (Signed)
Darbepoetin Alfa injection What is this medicine? DARBEPOETIN ALFA (dar be POE e tin AL fa) helps your body make more red blood cells. It is used to treat anemia caused by chronic kidney failure and chemotherapy. This medicine may be used for other purposes; ask your health care provider or pharmacist if you have questions. COMMON BRAND NAME(S): Aranesp What should I tell my health care provider before I take this medicine? They need to know if you have any of these conditions: -blood clotting disorders or history of blood clots -cancer patient not on chemotherapy -cystic fibrosis -heart disease, such as angina, heart failure, or a history of a heart attack -hemoglobin level of 12 g/dL or greater -high blood pressure -low levels of folate, iron, or vitamin B12 -seizures -an unusual or allergic reaction to darbepoetin, erythropoietin, albumin, hamster proteins, latex, other medicines, foods, dyes, or preservatives -pregnant or trying to get pregnant -breast-feeding How should I use this medicine? This medicine is for injection into a vein or under the skin. It is usually given by a health care professional in a hospital or clinic setting. If you get this medicine at home, you will be taught how to prepare and give this medicine. Do not shake the solution before you withdraw a dose. Use exactly as directed. Take your medicine at regular intervals. Do not take your medicine more often than directed. It is important that you put your used needles and syringes in a special sharps container. Do not put them in a trash can. If you do not have a sharps container, call your pharmacist or healthcare provider to get one. Talk to your pediatrician regarding the use of this medicine in children. While this medicine may be used in children as young as 1 year for selected conditions, precautions do apply. Overdosage: If you think you have taken too much of this medicine contact a poison control center or  emergency room at once. NOTE: This medicine is only for you. Do not share this medicine with others. What if I miss a dose? If you miss a dose, take it as soon as you can. If it is almost time for your next dose, take only that dose. Do not take double or extra doses. What may interact with this medicine? Do not take this medicine with any of the following medications: -epoetin alfa This list may not describe all possible interactions. Give your health care provider a list of all the medicines, herbs, non-prescription drugs, or dietary supplements you use. Also tell them if you smoke, drink alcohol, or use illegal drugs. Some items may interact with your medicine. What should I watch for while using this medicine? Visit your prescriber or health care professional for regular checks on your progress and for the needed blood tests and blood pressure measurements. It is especially important for the doctor to make sure your hemoglobin level is in the desired range, to limit the risk of potential side effects and to give you the best benefit. Keep all appointments for any recommended tests. Check your blood pressure as directed. Ask your doctor what your blood pressure should be and when you should contact him or her. As your body makes more red blood cells, you may need to take iron, folic acid, or vitamin B supplements. Ask your doctor or health care provider which products are right for you. If you have kidney disease continue dietary restrictions, even though this medication can make you feel better. Talk with your doctor or health   care professional about the foods you eat and the vitamins that you take. What side effects may I notice from receiving this medicine? Side effects that you should report to your doctor or health care professional as soon as possible: -allergic reactions like skin rash, itching or hives, swelling of the face, lips, or tongue -breathing problems -changes in vision -chest  pain -confusion, trouble speaking or understanding -feeling faint or lightheaded, falls -high blood pressure -muscle aches or pains -pain, swelling, warmth in the leg -rapid weight gain -severe headaches -sudden numbness or weakness of the face, arm or leg -trouble walking, dizziness, loss of balance or coordination -seizures (convulsions) -swelling of the ankles, feet, hands -unusually weak or tired Side effects that usually do not require medical attention (report to your doctor or health care professional if they continue or are bothersome): -diarrhea -fever, chills (flu-like symptoms) -headaches -nausea, vomiting -redness, stinging, or swelling at site where injected This list may not describe all possible side effects. Call your doctor for medical advice about side effects. You may report side effects to FDA at 1-800-FDA-1088. Where should I keep my medicine? Keep out of the reach of children. Store in a refrigerator between 2 and 8 degrees C (36 and 46 degrees F). Do not freeze. Do not shake. Throw away any unused portion if using a single-dose vial. Throw away any unused medicine after the expiration date. NOTE: This sheet is a summary. It may not cover all possible information. If you have questions about this medicine, talk to your doctor, pharmacist, or health care provider.  2015, [employer]. (2008-07-15 10:23:57)  

## 2016-10-19 DIAGNOSIS — M899 Disorder of bone, unspecified: Secondary | ICD-10-CM | POA: Diagnosis not present

## 2016-10-19 DIAGNOSIS — N185 Chronic kidney disease, stage 5: Secondary | ICD-10-CM | POA: Diagnosis not present

## 2016-10-19 DIAGNOSIS — N189 Chronic kidney disease, unspecified: Secondary | ICD-10-CM | POA: Diagnosis not present

## 2016-10-26 ENCOUNTER — Other Ambulatory Visit: Payer: Self-pay | Admitting: *Deleted

## 2016-10-26 ENCOUNTER — Ambulatory Visit (HOSPITAL_BASED_OUTPATIENT_CLINIC_OR_DEPARTMENT_OTHER): Payer: Medicare HMO

## 2016-10-26 ENCOUNTER — Ambulatory Visit: Payer: Medicare HMO

## 2016-10-26 ENCOUNTER — Ambulatory Visit (HOSPITAL_COMMUNITY)
Admission: RE | Admit: 2016-10-26 | Discharge: 2016-10-26 | Disposition: A | Discharge status: Home / Self Care | Payer: Medicare HMO | Source: Ambulatory Visit | Attending: Oncology | Admitting: Oncology

## 2016-10-26 ENCOUNTER — Telehealth: Payer: Self-pay | Admitting: Neurology

## 2016-10-26 ENCOUNTER — Other Ambulatory Visit (HOSPITAL_BASED_OUTPATIENT_CLINIC_OR_DEPARTMENT_OTHER): Payer: Medicare HMO

## 2016-10-26 VITALS — BP 158/82 | HR 76 | Temp 97.5°F | Resp 18

## 2016-10-26 DIAGNOSIS — D649 Anemia, unspecified: Secondary | ICD-10-CM

## 2016-10-26 DIAGNOSIS — N184 Chronic kidney disease, stage 4 (severe): Secondary | ICD-10-CM

## 2016-10-26 DIAGNOSIS — N189 Chronic kidney disease, unspecified: Secondary | ICD-10-CM | POA: Diagnosis not present

## 2016-10-26 DIAGNOSIS — D631 Anemia in chronic kidney disease: Secondary | ICD-10-CM

## 2016-10-26 DIAGNOSIS — D509 Iron deficiency anemia, unspecified: Secondary | ICD-10-CM

## 2016-10-26 DIAGNOSIS — D696 Thrombocytopenia, unspecified: Secondary | ICD-10-CM

## 2016-10-26 LAB — CBC WITH DIFFERENTIAL/PLATELET
BASO%: 0.9 % (ref 0.0–2.0)
BASOS ABS: 0.1 10*3/uL (ref 0.0–0.1)
EOS%: 4.7 % (ref 0.0–7.0)
Eosinophils Absolute: 0.3 10*3/uL (ref 0.0–0.5)
HEMATOCRIT: 20.9 % — AB (ref 34.8–46.6)
HEMOGLOBIN: 6.7 g/dL — AB (ref 11.6–15.9)
LYMPH#: 1.5 10*3/uL (ref 0.9–3.3)
LYMPH%: 25.6 % (ref 14.0–49.7)
MCH: 33 pg (ref 25.1–34.0)
MCHC: 32.1 g/dL (ref 31.5–36.0)
MCV: 103 fL — AB (ref 79.5–101.0)
MONO#: 0.6 10*3/uL (ref 0.1–0.9)
MONO%: 9.8 % (ref 0.0–14.0)
NEUT#: 3.4 10*3/uL (ref 1.5–6.5)
NEUT%: 59 % (ref 38.4–76.8)
NRBC: 0 % (ref 0–0)
Platelets: 90 10*3/uL — ABNORMAL LOW (ref 145–400)
RBC: 2.03 10*6/uL — ABNORMAL LOW (ref 3.70–5.45)
RDW: 15.4 % — AB (ref 11.2–14.5)
WBC: 5.7 10*3/uL (ref 3.9–10.3)

## 2016-10-26 LAB — PREPARE RBC (CROSSMATCH)

## 2016-10-26 MED ORDER — DARBEPOETIN ALFA 300 MCG/0.6ML IJ SOSY
300.0000 ug | PREFILLED_SYRINGE | Freq: Once | INTRAMUSCULAR | Status: AC
  Administered 2016-10-26: 300 ug via SUBCUTANEOUS
  Filled 2016-10-26: qty 0.6

## 2016-10-26 NOTE — Progress Notes (Signed)
Pt reports bloody for the last 3 days. Stated Neurologist had stated her on a 325 mg ASA appox one month ago.  Waiting orders and instructions from Dr. Alen Blew

## 2016-10-26 NOTE — Telephone Encounter (Signed)
Patient called office in reference to seeing Dr. Erlinda Hong in the hospital.  Patient has an appointment with Cecille Rubin in April, but per patient she states Dr. Erlinda Hong recommended patient taking a full strength aspirin.  Patient states she started to have rectal bleeding has been to her hematologist and will be having a transfusion.  Patien feels she can't not take the full strength aspirin.  Patient advised to contact PCP and will also send a message to Dr. Erlinda Hong and his nurse.

## 2016-10-26 NOTE — Telephone Encounter (Signed)
Message sent to Dr. Erlinda Hong for [facility].

## 2016-10-26 NOTE — Telephone Encounter (Signed)
Noted. Thank you.  Rosalin Hawking, MD PhD Stroke Neurology 10/26/2016 6:57 PM

## 2016-10-26 NOTE — Patient Instructions (Signed)
Darbepoetin Alfa injection What is this medicine? DARBEPOETIN ALFA (dar be POE e tin AL fa) helps your body make more red blood cells. It is used to treat anemia caused by chronic kidney failure and chemotherapy. This medicine may be used for other purposes; ask your health care provider or pharmacist if you have questions. COMMON BRAND NAME(S): Aranesp What should I tell my health care provider before I take this medicine? They need to know if you have any of these conditions: -blood clotting disorders or history of blood clots -cancer patient not on chemotherapy -cystic fibrosis -heart disease, such as angina, heart failure, or a history of a heart attack -hemoglobin level of 12 g/dL or greater -high blood pressure -low levels of folate, iron, or vitamin B12 -seizures -an unusual or allergic reaction to darbepoetin, erythropoietin, albumin, hamster proteins, latex, other medicines, foods, dyes, or preservatives -pregnant or trying to get pregnant -breast-feeding How should I use this medicine? This medicine is for injection into a vein or under the skin. It is usually given by a health care professional in a hospital or clinic setting. If you get this medicine at home, you will be taught how to prepare and give this medicine. Do not shake the solution before you withdraw a dose. Use exactly as directed. Take your medicine at regular intervals. Do not take your medicine more often than directed. It is important that you put your used needles and syringes in a special sharps container. Do not put them in a trash can. If you do not have a sharps container, call your pharmacist or healthcare provider to get one. Talk to your pediatrician regarding the use of this medicine in children. While this medicine may be used in children as young as 1 year for selected conditions, precautions do apply. Overdosage: If you think you have taken too much of this medicine contact a poison control center or  emergency room at once. NOTE: This medicine is only for you. Do not share this medicine with others. What if I miss a dose? If you miss a dose, take it as soon as you can. If it is almost time for your next dose, take only that dose. Do not take double or extra doses. What may interact with this medicine? Do not take this medicine with any of the following medications: -epoetin alfa This list may not describe all possible interactions. Give your health care provider a list of all the medicines, herbs, non-prescription drugs, or dietary supplements you use. Also tell them if you smoke, drink alcohol, or use illegal drugs. Some items may interact with your medicine. What should I watch for while using this medicine? Visit your prescriber or health care professional for regular checks on your progress and for the needed blood tests and blood pressure measurements. It is especially important for the doctor to make sure your hemoglobin level is in the desired range, to limit the risk of potential side effects and to give you the best benefit. Keep all appointments for any recommended tests. Check your blood pressure as directed. Ask your doctor what your blood pressure should be and when you should contact him or her. As your body makes more red blood cells, you may need to take iron, folic acid, or vitamin B supplements. Ask your doctor or health care provider which products are right for you. If you have kidney disease continue dietary restrictions, even though this medication can make you feel better. Talk with your doctor or health   care professional about the foods you eat and the vitamins that you take. What side effects may I notice from receiving this medicine? Side effects that you should report to your doctor or health care professional as soon as possible: -allergic reactions like skin rash, itching or hives, swelling of the face, lips, or tongue -breathing problems -changes in vision -chest  pain -confusion, trouble speaking or understanding -feeling faint or lightheaded, falls -high blood pressure -muscle aches or pains -pain, swelling, warmth in the leg -rapid weight gain -severe headaches -sudden numbness or weakness of the face, arm or leg -trouble walking, dizziness, loss of balance or coordination -seizures (convulsions) -swelling of the ankles, feet, hands -unusually weak or tired Side effects that usually do not require medical attention (report to your doctor or health care professional if they continue or are bothersome): -diarrhea -fever, chills (flu-like symptoms) -headaches -nausea, vomiting -redness, stinging, or swelling at site where injected This list may not describe all possible side effects. Call your doctor for medical advice about side effects. You may report side effects to FDA at 1-800-FDA-1088. Where should I keep my medicine? Keep out of the reach of children. Store in a refrigerator between 2 and 8 degrees C (36 and 46 degrees F). Do not freeze. Do not shake. Throw away any unused portion if using a single-dose vial. Throw away any unused medicine after the expiration date. NOTE: This sheet is a summary. It may not cover all possible information. If you have questions about this medicine, talk to your doctor, pharmacist, or health care provider.  2015, [employer]. (2008-07-15 10:23:57)  

## 2016-10-27 ENCOUNTER — Encounter: Payer: Self-pay | Admitting: Cardiology

## 2016-10-27 ENCOUNTER — Ambulatory Visit (HOSPITAL_COMMUNITY)
Admission: RE | Admit: 2016-10-27 | Discharge: 2016-10-27 | Disposition: A | Discharge status: Home / Self Care | Payer: Medicare HMO | Source: Ambulatory Visit | Attending: Oncology | Admitting: Oncology

## 2016-10-27 DIAGNOSIS — D649 Anemia, unspecified: Secondary | ICD-10-CM

## 2016-10-27 MED ORDER — DIPHENHYDRAMINE HCL 25 MG PO CAPS
25.0000 mg | ORAL_CAPSULE | Freq: Once | ORAL | Status: AC
  Administered 2016-10-27: 25 mg via ORAL
  Filled 2016-10-27: qty 1

## 2016-10-27 MED ORDER — SODIUM CHLORIDE 0.9% FLUSH: 10.0000 mL | INTRAVENOUS | Status: DC | PRN

## 2016-10-27 MED ORDER — SODIUM CHLORIDE 0.9 % IV SOLN
250.0000 mL | Freq: Once | INTRAVENOUS | Status: AC
  Administered 2016-10-27: 250 mL via INTRAVENOUS

## 2016-10-27 MED ORDER — ACETAMINOPHEN 325 MG PO TABS
650.0000 mg | ORAL_TABLET | Freq: Once | ORAL | Status: AC
  Administered 2016-10-27: 650 mg via ORAL
  Filled 2016-10-27: qty 2

## 2016-10-27 MED ORDER — HEPARIN SOD (PORK) LOCK FLUSH 100 UNIT/ML IV SOLN: 500.0000 [IU] | Freq: Every day | INTRAVENOUS | Status: DC | PRN

## 2016-10-27 NOTE — Progress Notes (Signed)
Diagnosis Association: Symptomatic anemia (D64.9)  Provider : Alen Blew  Procedure: Pt received 2 units of prbcs.  Pt tolerated procedure well.  Post procedure: Pt alert,oriented and ambulatory to wheel chair. Received discharge instructions with verbal understanding.

## 2016-10-27 NOTE — Discharge Instructions (Signed)

## 2016-10-28 DIAGNOSIS — D631 Anemia in chronic kidney disease: Secondary | ICD-10-CM | POA: Diagnosis not present

## 2016-10-28 DIAGNOSIS — N185 Chronic kidney disease, stage 5: Secondary | ICD-10-CM | POA: Diagnosis not present

## 2016-10-28 DIAGNOSIS — M899 Disorder of bone, unspecified: Secondary | ICD-10-CM | POA: Diagnosis not present

## 2016-10-28 DIAGNOSIS — I120 Hypertensive chronic kidney disease with stage 5 chronic kidney disease or end stage renal disease: Secondary | ICD-10-CM | POA: Diagnosis not present

## 2016-10-28 DIAGNOSIS — E1122 Type 2 diabetes mellitus with diabetic chronic kidney disease: Secondary | ICD-10-CM | POA: Diagnosis not present

## 2016-10-28 DIAGNOSIS — R195 Other fecal abnormalities: Secondary | ICD-10-CM | POA: Diagnosis not present

## 2016-10-28 DIAGNOSIS — E872 Acidosis: Secondary | ICD-10-CM | POA: Diagnosis not present

## 2016-10-28 LAB — BPAM RBC
Blood Product Expiration Date: 201803282359
Blood Product Expiration Date: 201803292359
ISSUE DATE / TIME: 201803150819
ISSUE DATE / TIME: 201803150819
UNIT TYPE AND RH: 9500
Unit Type and Rh: 9500

## 2016-10-28 LAB — TYPE AND SCREEN
ABO/RH(D): O NEG
ANTIBODY SCREEN: NEGATIVE
UNIT DIVISION: 0
UNIT DIVISION: 0

## 2016-10-31 ENCOUNTER — Inpatient Hospital Stay (HOSPITAL_COMMUNITY)
Admission: EM | Admit: 2016-10-31 | Discharge: 2016-11-03 | DRG: 378 | Disposition: A | Discharge status: Home / Self Care | Payer: Medicare HMO | Attending: Family Medicine | Admitting: Family Medicine

## 2016-10-31 ENCOUNTER — Other Ambulatory Visit: Payer: Self-pay

## 2016-10-31 ENCOUNTER — Encounter (HOSPITAL_COMMUNITY): Payer: Self-pay | Admitting: Family Medicine

## 2016-10-31 DIAGNOSIS — I2510 Atherosclerotic heart disease of native coronary artery without angina pectoris: Secondary | ICD-10-CM | POA: Diagnosis present

## 2016-10-31 DIAGNOSIS — N19 Unspecified kidney failure: Secondary | ICD-10-CM | POA: Diagnosis not present

## 2016-10-31 DIAGNOSIS — I350 Nonrheumatic aortic (valve) stenosis: Secondary | ICD-10-CM | POA: Diagnosis present

## 2016-10-31 DIAGNOSIS — I5032 Chronic diastolic (congestive) heart failure: Secondary | ICD-10-CM | POA: Diagnosis not present

## 2016-10-31 DIAGNOSIS — E785 Hyperlipidemia, unspecified: Secondary | ICD-10-CM | POA: Diagnosis present

## 2016-10-31 DIAGNOSIS — D500 Iron deficiency anemia secondary to blood loss (chronic): Secondary | ICD-10-CM | POA: Diagnosis present

## 2016-10-31 DIAGNOSIS — R9431 Abnormal electrocardiogram [ECG] [EKG]: Secondary | ICD-10-CM | POA: Diagnosis not present

## 2016-10-31 DIAGNOSIS — D631 Anemia in chronic kidney disease: Secondary | ICD-10-CM | POA: Diagnosis present

## 2016-10-31 DIAGNOSIS — K219 Gastro-esophageal reflux disease without esophagitis: Secondary | ICD-10-CM | POA: Diagnosis present

## 2016-10-31 DIAGNOSIS — R031 Nonspecific low blood-pressure reading: Secondary | ICD-10-CM | POA: Diagnosis not present

## 2016-10-31 DIAGNOSIS — N189 Chronic kidney disease, unspecified: Secondary | ICD-10-CM

## 2016-10-31 DIAGNOSIS — Z951 Presence of aortocoronary bypass graft: Secondary | ICD-10-CM

## 2016-10-31 DIAGNOSIS — Z79899 Other long term (current) drug therapy: Secondary | ICD-10-CM | POA: Diagnosis not present

## 2016-10-31 DIAGNOSIS — F411 Generalized anxiety disorder: Secondary | ICD-10-CM | POA: Diagnosis present

## 2016-10-31 DIAGNOSIS — Z8673 Personal history of transient ischemic attack (TIA), and cerebral infarction without residual deficits: Secondary | ICD-10-CM | POA: Diagnosis not present

## 2016-10-31 DIAGNOSIS — Z7982 Long term (current) use of aspirin: Secondary | ICD-10-CM

## 2016-10-31 DIAGNOSIS — K922 Gastrointestinal hemorrhage, unspecified: Secondary | ICD-10-CM | POA: Diagnosis not present

## 2016-10-31 DIAGNOSIS — I252 Old myocardial infarction: Secondary | ICD-10-CM

## 2016-10-31 DIAGNOSIS — I770 Arteriovenous fistula, acquired: Secondary | ICD-10-CM | POA: Diagnosis not present

## 2016-10-31 DIAGNOSIS — R5383 Other fatigue: Secondary | ICD-10-CM | POA: Diagnosis not present

## 2016-10-31 DIAGNOSIS — N185 Chronic kidney disease, stage 5: Secondary | ICD-10-CM | POA: Diagnosis not present

## 2016-10-31 DIAGNOSIS — I130 Hypertensive heart and chronic kidney disease with heart failure and stage 1 through stage 4 chronic kidney disease, or unspecified chronic kidney disease: Secondary | ICD-10-CM | POA: Diagnosis present

## 2016-10-31 DIAGNOSIS — I129 Hypertensive chronic kidney disease with stage 1 through stage 4 chronic kidney disease, or unspecified chronic kidney disease: Secondary | ICD-10-CM | POA: Diagnosis not present

## 2016-10-31 DIAGNOSIS — N390 Urinary tract infection, site not specified: Secondary | ICD-10-CM | POA: Diagnosis not present

## 2016-10-31 DIAGNOSIS — E119 Type 2 diabetes mellitus without complications: Secondary | ICD-10-CM | POA: Diagnosis not present

## 2016-10-31 DIAGNOSIS — E1140 Type 2 diabetes mellitus with diabetic neuropathy, unspecified: Secondary | ICD-10-CM | POA: Diagnosis not present

## 2016-10-31 DIAGNOSIS — I10 Essential (primary) hypertension: Secondary | ICD-10-CM

## 2016-10-31 DIAGNOSIS — E1122 Type 2 diabetes mellitus with diabetic chronic kidney disease: Secondary | ICD-10-CM | POA: Diagnosis not present

## 2016-10-31 DIAGNOSIS — K633 Ulcer of intestine: Secondary | ICD-10-CM | POA: Diagnosis not present

## 2016-10-31 DIAGNOSIS — K921 Melena: Principal | ICD-10-CM | POA: Diagnosis present

## 2016-10-31 DIAGNOSIS — Z792 Long term (current) use of antibiotics: Secondary | ICD-10-CM | POA: Diagnosis not present

## 2016-10-31 DIAGNOSIS — E875 Hyperkalemia: Secondary | ICD-10-CM | POA: Diagnosis not present

## 2016-10-31 DIAGNOSIS — I120 Hypertensive chronic kidney disease with stage 5 chronic kidney disease or end stage renal disease: Secondary | ICD-10-CM | POA: Diagnosis not present

## 2016-10-31 DIAGNOSIS — R195 Other fecal abnormalities: Secondary | ICD-10-CM | POA: Diagnosis not present

## 2016-10-31 DIAGNOSIS — K5730 Diverticulosis of large intestine without perforation or abscess without bleeding: Secondary | ICD-10-CM | POA: Diagnosis not present

## 2016-10-31 DIAGNOSIS — N186 End stage renal disease: Secondary | ICD-10-CM | POA: Diagnosis not present

## 2016-10-31 DIAGNOSIS — N179 Acute kidney failure, unspecified: Secondary | ICD-10-CM | POA: Diagnosis present

## 2016-10-31 DIAGNOSIS — N184 Chronic kidney disease, stage 4 (severe): Secondary | ICD-10-CM | POA: Diagnosis present

## 2016-10-31 DIAGNOSIS — Z87891 Personal history of nicotine dependence: Secondary | ICD-10-CM

## 2016-10-31 DIAGNOSIS — D509 Iron deficiency anemia, unspecified: Secondary | ICD-10-CM | POA: Diagnosis not present

## 2016-10-31 DIAGNOSIS — K625 Hemorrhage of anus and rectum: Secondary | ICD-10-CM | POA: Diagnosis not present

## 2016-10-31 DIAGNOSIS — D62 Acute posthemorrhagic anemia: Secondary | ICD-10-CM | POA: Diagnosis not present

## 2016-10-31 DIAGNOSIS — R112 Nausea with vomiting, unspecified: Secondary | ICD-10-CM | POA: Diagnosis not present

## 2016-10-31 DIAGNOSIS — R231 Pallor: Secondary | ICD-10-CM | POA: Diagnosis not present

## 2016-10-31 DIAGNOSIS — D649 Anemia, unspecified: Secondary | ICD-10-CM | POA: Diagnosis not present

## 2016-10-31 LAB — COMPREHENSIVE METABOLIC PANEL
ALBUMIN: 3.2 g/dL — AB (ref 3.5–5.0)
ALT: 11 U/L — ABNORMAL LOW (ref 14–54)
ANION GAP: 13 (ref 5–15)
AST: 14 U/L — AB (ref 15–41)
Alkaline Phosphatase: 31 U/L — ABNORMAL LOW (ref 38–126)
BILIRUBIN TOTAL: 0.2 mg/dL — AB (ref 0.3–1.2)
BUN: 87 mg/dL — AB (ref 6–20)
CHLORIDE: 108 mmol/L (ref 101–111)
CO2: 13 mmol/L — AB (ref 22–32)
Calcium: 8.4 mg/dL — ABNORMAL LOW (ref 8.9–10.3)
Creatinine, Ser: 4.64 mg/dL — ABNORMAL HIGH (ref 0.44–1.00)
GFR calc Af Amer: 10 mL/min — ABNORMAL LOW (ref >60)
GFR calc non Af Amer: 8 mL/min — ABNORMAL LOW (ref >60)
GLUCOSE: 159 mg/dL — AB (ref 65–99)
POTASSIUM: 5.3 mmol/L — AB (ref 3.5–5.1)
SODIUM: 134 mmol/L — AB (ref 135–145)
Total Protein: 5.2 g/dL — ABNORMAL LOW (ref 6.5–8.1)

## 2016-10-31 LAB — CBC
HEMATOCRIT: 15.1 % — AB (ref 36.0–46.0)
HEMATOCRIT: 23.7 % — AB (ref 36.0–46.0)
HEMOGLOBIN: 5 g/dL — AB (ref 12.0–15.0)
Hemoglobin: 7.9 g/dL — ABNORMAL LOW (ref 12.0–15.0)
MCH: 32.5 pg (ref 26.0–34.0)
MCH: 30.7 pg (ref 26.0–34.0)
MCHC: 33.1 g/dL (ref 30.0–36.0)
MCHC: 33.3 g/dL (ref 30.0–36.0)
MCV: 98.1 fL (ref 78.0–100.0)
MCV: 92.2 fL (ref 78.0–100.0)
Platelets: 180 10*3/uL (ref 150–400)
Platelets: 137 10*3/uL — ABNORMAL LOW (ref 150–400)
RBC: 1.54 MIL/uL — ABNORMAL LOW (ref 3.87–5.11)
RBC: 2.57 MIL/uL — ABNORMAL LOW (ref 3.87–5.11)
RDW: 20.6 % — AB (ref 11.5–15.5)
RDW: 17.6 % — ABNORMAL HIGH (ref 11.5–15.5)
WBC: 11.2 10*3/uL — ABNORMAL HIGH (ref 4.0–10.5)
WBC: 11.5 10*3/uL — ABNORMAL HIGH (ref 4.0–10.5)

## 2016-10-31 LAB — POC OCCULT BLOOD, ED: Fecal Occult Bld: POSITIVE — AB

## 2016-10-31 LAB — TROPONIN I: Troponin I: 0.08 ng/mL (ref <0.03)

## 2016-10-31 LAB — PREPARE RBC (CROSSMATCH)

## 2016-10-31 MED ORDER — PANTOPRAZOLE SODIUM 40 MG IV SOLR
40.0000 mg | Freq: Two times a day (BID) | INTRAVENOUS | Status: DC
  Administered 2016-10-31 – 2016-11-03 (×6): 40 mg via INTRAVENOUS
  Filled 2016-10-31 (×6): qty 40

## 2016-10-31 MED ORDER — CALCIUM ACETATE (PHOS BINDER) 667 MG PO CAPS
1334.0000 mg | ORAL_CAPSULE | Freq: Three times a day (TID) | ORAL | Status: DC
  Administered 2016-10-31 – 2016-11-03 (×4): 1334 mg via ORAL
  Filled 2016-10-31 (×8): qty 2

## 2016-10-31 MED ORDER — TRIMETHOPRIM 100 MG PO TABS
50.0000 mg | ORAL_TABLET | Freq: Every day | ORAL | Status: DC
  Administered 2016-10-31 – 2016-11-03 (×4): 50 mg via ORAL
  Filled 2016-10-31 (×4): qty 1

## 2016-10-31 MED ORDER — AMLODIPINE BESYLATE 5 MG PO TABS
5.0000 mg | ORAL_TABLET | Freq: Every day | ORAL | Status: DC
  Filled 2016-10-31: qty 1

## 2016-10-31 MED ORDER — CARVEDILOL 12.5 MG PO TABS
12.5000 mg | ORAL_TABLET | Freq: Two times a day (BID) | ORAL | Status: DC
  Administered 2016-10-31 – 2016-11-03 (×4): 12.5 mg via ORAL
  Filled 2016-10-31 (×7): qty 1

## 2016-10-31 MED ORDER — SODIUM BICARBONATE 650 MG PO TABS
650.0000 mg | ORAL_TABLET | Freq: Two times a day (BID) | ORAL | Status: DC
  Administered 2016-10-31 – 2016-11-03 (×6): 650 mg via ORAL
  Filled 2016-10-31 (×6): qty 1

## 2016-10-31 MED ORDER — ACETAMINOPHEN 325 MG PO TABS: 650.0000 mg | ORAL_TABLET | Freq: Four times a day (QID) | ORAL | Status: DC | PRN

## 2016-10-31 MED ORDER — HYDRALAZINE HCL 20 MG/ML IJ SOLN: 5.0000 mg | INTRAMUSCULAR | Status: DC | PRN

## 2016-10-31 MED ORDER — ONDANSETRON HCL 4 MG PO TABS: 4.0000 mg | ORAL_TABLET | Freq: Four times a day (QID) | ORAL | Status: DC | PRN

## 2016-10-31 MED ORDER — PATIROMER SORBITEX CALCIUM 8.4 G PO PACK
8.4000 g | PACK | Freq: Every day | ORAL | Status: DC
  Administered 2016-10-31: 8.4 g via ORAL
  Filled 2016-10-31 (×5): qty 4

## 2016-10-31 MED ORDER — DARBEPOETIN ALFA 300 MCG/0.6ML IJ SOSY
300.0000 ug | PREFILLED_SYRINGE | Freq: Once | INTRAMUSCULAR | Status: DC
Start: 2016-11-04 — End: 2016-11-01

## 2016-10-31 MED ORDER — SODIUM CHLORIDE 0.9 % IV SOLN
10.0000 mL/h | Freq: Once | INTRAVENOUS | Status: AC
  Administered 2016-10-31: 10 mL/h via INTRAVENOUS

## 2016-10-31 MED ORDER — ROSUVASTATIN CALCIUM 5 MG PO TABS
5.0000 mg | ORAL_TABLET | Freq: Every day | ORAL | Status: DC
  Administered 2016-10-31 – 2016-11-03 (×4): 5 mg via ORAL
  Filled 2016-10-31 (×4): qty 1

## 2016-10-31 MED ORDER — ONDANSETRON HCL 4 MG/2ML IJ SOLN: 4.0000 mg | Freq: Four times a day (QID) | INTRAMUSCULAR | Status: DC | PRN

## 2016-10-31 MED ORDER — SODIUM CHLORIDE 0.9 % IV SOLN
INTRAVENOUS | Status: DC
  Administered 2016-11-01 (×2): via INTRAVENOUS

## 2016-10-31 MED ORDER — ALPRAZOLAM 0.5 MG PO TABS
0.5000 mg | ORAL_TABLET | Freq: Every day | ORAL | Status: DC
  Administered 2016-10-31 – 2016-11-03 (×3): 0.5 mg via ORAL
  Filled 2016-10-31 (×3): qty 1

## 2016-10-31 MED ORDER — ACETAMINOPHEN 650 MG RE SUPP
650.0000 mg | Freq: Four times a day (QID) | RECTAL | Status: DC | PRN
Start: 2016-10-31 — End: 2016-11-02

## 2016-10-31 NOTE — H&P (Signed)
History and Physical    [name] MWN:[id number] DOB: 1940-12-20 DOA: 10/31/2016  PCP: [name], MD Patient coming from: home  Chief Complaint: fatigue, generalized weakness.   HPI: [name] is a 76 y.o. female with medical history significant of  GI bleed, chronic anemia. CKD IV-V, NSTEMI s/p CABG, diabetes, diabetic neuropathy, GERD, diastolic congestive heart failure.   Patient presenting with a general complaint of lightheadedness, weakness and pale appearance. Patient with recent history of GI bleed that is likely contributing. Received a blood transfusion 4 days ago. Since that time she has been getting progressively more tired and weak. Symptoms are constant. Patient presented last 24 hours became significantly worse. Intermittent nausea without any abdominal pain or vomiting. Patient seen by her primary care physician on day of admission history sent patient to the ED. Endorsing dark tarry stools but no abd pain. Last colonoscopy in 2011. Unsure of last endoscopy. No NSAID use. Not on dialysis but w/ fistula in place for >70yr. Urinates several times per day.    ED Course: objective findings outelined below  Review of Systems: As per HPI otherwise 10 point review of systems negative.   Ambulatory Status:no restrictions  Past Medical History:  Diagnosis Date  . Anemia    Pt is taking iron.   [name] Anxiety   . Arthritis   . Carotid stenosis    40-59% bilateral ICA stenosis in 2/12.  . Chronic low back pain   . CKD (chronic kidney disease)    Dr. Audie Clear at [facility]  . Coronary artery disease    Pt presented 2/10 to [facility] with NSTEMI and diastolic CHF exacerbation.  LHC was done  3/10 showing 99% pRCA stenosis and 80% calcified pLAD stenosis with L=>R collaterals.  Pt was referred  for CABG which was done by Dr. Prescott Gum with LIMA-LAD, SVG-RCA, SVG-OM.  . Diabetes mellitus   . Diabetic neuropathy ([place])   . Diastolic CHF (HCC)    Echo (2/10)  showed EF 55-65%, mild LVH, diastolic dysfunction, mild AS with mean gradient 12 mmHg, PASP 43 mmHg.  Echo (2/12): EF 55-60%, mild LVH, mild AS (mean gradient 12), PA systolic pressure 32 mmHg.     [name] GERD (gastroesophageal reflux disease)   . Heart murmur   . Hyperlipidemia   . Hypertension   . Mild aortic stenosis    mean gradient 12 mmHg in 2/12.  . Myocardial infarction    "mild"  . Pneumonia   . PONV (postoperative nausea and vomiting)   . Thrombocytopenia ([place])   . Unsteady gait     Past Surgical History:  Procedure Laterality Date  . AV FISTULA PLACEMENT Left 01/30/2015   Procedure: Creation of a Radial Cephalic AV Fistula left wrist;  Surgeon: [name], MD;  Location: [place];  Service: Vascular;  Laterality: Left;  . BACK SURGERY     multiple  . BREAST SURGERY     biopsy  . CARDIAC CATHETERIZATION    . CATARACT EXTRACTION W/ INTRAOCULAR LENS  IMPLANT, BILATERAL    . COLONOSCOPY W/ BIOPSIES AND POLYPECTOMY    . CORONARY ARTERY BYPASS GRAFT  09/2008   pt with NSTEMI and diastolic CHF exacerbation.  LHC was done  3/10 showing 99% pRCA stenosis and 80% calcified pLAD stenosis with L=>R collaterals.  Pt was referred  for CABG which was done by Dr. Prescott Gum with LIMA-LAD, SVG-RCA, SVG-OM.  [name] REVISON OF ARTERIOVENOUS FISTULA Left 07/02/2015   Procedure: REVISON OF  LEFT RADIOCEPHALIC ARTERIOVENOUS FISTULA;  Surgeon: [name], MD;  [facility] OR;  Service: Vascular;  Laterality: Left;    Social History   Social History  . Marital status: Married    Spouse name: N/A  . Number of children: N/A  . Years of education: N/A   Occupational History  . Not on file.   Social History Main Topics  . Smoking status: Former Smoker    Types: Cigarettes  . Smokeless tobacco: Never Used     Comment: quit 1988  . Alcohol use Yes     Comment: beer daily  . Drug use: No  . Sexual activity: No   Other Topics Concern  . Not on file   Social History Narrative  . No  narrative on file    Allergies  Allergen Reactions  . Lipitor [Atorvastatin] Other (See Comments)    Stomach pain  . Strawberry Extract Rash    Family History  Problem Relation Age of Onset  . Adopted: Yes  . Family history unknown: Yes    Prior to Admission medications   Medication Sig Start Date End Date Taking? Authorizing Provider  ALPRAZolam Duanne Moron) 0.5 MG tablet Take 0.5 mg by mouth at bedtime.     Historical Provider, MD  amLODipine (NORVASC) 5 MG tablet Take 1 tablet (5 mg total) by mouth daily. 01/20/15   [name], NP  aspirin 325 MG tablet Take 1 tablet (325 mg total) by mouth daily. 09/28/16   Geradine Girt, DO  calcium acetate (PHOSLO) 667 MG capsule Take 1,334 mg by mouth 3 (three) times daily with meals. As directed 12/17/14   Historical Provider, MD  carvedilol (COREG) 12.5 MG tablet TAKE 1 TABLET(12.5 MG) BY MOUTH TWICE DAILY 03/14/16   [name], NP  cholecalciferol (VITAMIN D) 1000 UNITS tablet Take 1,000 Units by mouth daily.    Historical Provider, MD  furosemide (LASIX) 20 MG tablet Take 20 mg by mouth daily. As directed 12/17/14   Historical Provider, MD  losartan (COZAAR) 25 MG tablet Take 1 tablet (25 mg total) by mouth daily. 04/22/14   Larey Dresser, MD  rosuvastatin (CRESTOR) 5 MG tablet TAKE 1 TABLET(5 MG) BY MOUTH DAILY 07/05/16   [name], NP  sodium bicarbonate 650 MG tablet Take 650 mg by mouth 2 (two) times daily.    Historical Provider, MD  trimethoprim (TRIMPEX) 100 MG tablet Take 50 mg by mouth daily.  11/07/14   Historical Provider, MD  VELTASSA 8.4 G packet Take 8.4 g by mouth daily after lunch.  05/28/15   Historical Provider, MD    Physical Exam: Vitals:   10/31/16 1330 10/31/16 1345 10/31/16 1400 10/31/16 1405  BP: 129/89 (!) 131/93 (!) 135/48 (!) 145/48  Pulse: 73 79 69 69  Resp: (!) 23 20 16 18   Temp:  97.6 F (36.4 C)  97.7 F (36.5 C)  TempSrc:  Oral  Oral  SpO2: 100% 100% 100% 100%  Weight:      Height:          General: Very pale and frail appearing, resting in bed. Comfortable. Eyes:  PERRL, EOMI, normal lids, iris ENT:  grossly normal hearing, lips & tongue, mmm Neck:  no LAD, masses or thyromegaly Cardiovascular: 4/6 systolic murmur, regular rate and rhythm, no m edema  Respiratory:  CTA bilaterally, no w/r/r. Normal respiratory effort. Abdomen:  soft, ntnd, NABS Skin:  no rash or induration seen on limited exam Musculoskeletal:  grossly  normal tone BUE/BLE, good ROM, no bony abnormality Psychiatric:  grossly normal mood and affect, speech fluent and appropriate, AOx3 Neurologic:  CN 2-12 grossly intact, moves all extremities in coordinated fashion, sensation intact  Labs on Admission: I have personally reviewed following labs and imaging studies  CBC:  Recent Labs Lab 10/26/16 0944 10/31/16 1159  WBC 5.7 11.2*  NEUTROABS 3.4  --   HGB 6.7* 5.0*  HCT 20.9* 15.1*  MCV 103.0* 98.1  PLT 90* 338   Basic Metabolic Panel:  Recent Labs Lab 10/31/16 1159  NA 134*  K 5.3*  CL 108  CO2 13*  GLUCOSE 159*  BUN 87*  CREATININE 4.64*  CALCIUM 8.4*   GFR: Estimated Creatinine Clearance: 10.1 mL/min (A) (by C-G formula based on SCr of 4.64 mg/dL (H)). Liver Function Tests:  Recent Labs Lab 10/31/16 1159  AST 14*  ALT 11*  ALKPHOS 31*  BILITOT 0.2*  PROT 5.2*  ALBUMIN 3.2*   No results for input(s): LIPASE, AMYLASE in the last 168 hours. No results for input(s): AMMONIA in the last 168 hours. Coagulation Profile: No results for input(s): INR, PROTIME in the last 168 hours. Cardiac Enzymes: No results for input(s): CKTOTAL, CKMB, CKMBINDEX, TROPONINI in the last 168 hours. BNP (last 3 results) No results for input(s): PROBNP in the last 8760 hours. HbA1C: No results for input(s): HGBA1C in the last 72 hours. CBG: No results for input(s): GLUCAP in the last 168 hours. Lipid Profile: No results for input(s): CHOL, HDL, LDLCALC, TRIG, CHOLHDL, LDLDIRECT in the last  72 hours. Thyroid Function Tests: No results for input(s): TSH, T4TOTAL, FREET4, T3FREE, THYROIDAB in the last 72 hours. Anemia Panel: No results for input(s): VITAMINB12, FOLATE, FERRITIN, TIBC, IRON, RETICCTPCT in the last 72 hours. Urine analysis:    Component Value Date/Time   COLORURINE YELLOW 09/26/2016 1744   APPEARANCEUR TURBID (A) 09/26/2016 1744   LABSPEC 1.005 09/26/2016 1744   PHURINE 5.0 09/26/2016 1744   GLUCOSEU NEGATIVE 09/26/2016 1744   HGBUR SMALL (A) 09/26/2016 1744   BILIRUBINUR NEGATIVE 09/26/2016 1744   KETONESUR NEGATIVE 09/26/2016 1744   PROTEINUR 100 (A) 09/26/2016 1744   UROBILINOGEN 0.2 10/14/2008 0612   NITRITE NEGATIVE 09/26/2016 1744   LEUKOCYTESUR LARGE (A) 09/26/2016 1744    Creatinine Clearance: Estimated Creatinine Clearance: 10.1 mL/min (A) (by C-G formula based on SCr of 4.64 mg/dL (H)).  Sepsis Labs: @LABRCNTIP (procalcitonin:4,lacticidven:4) )No results found for this or any previous visit (from the past 240 hour(s)).   Radiological Exams on Admission: No results found.  EKG: Independently reviewed. Pending   Assessment/Plan Active Problems:   Anemia, chronic renal failure   Anxiety state   Essential hypertension   Coronary atherosclerosis   Chronic kidney disease (CKD), stage IV (severe) (HCC)   GI bleed   Blood loss anemia   AKI (acute kidney injury) ([place])   Subacute symptomatic anemia: Likely secondary to blood loss from GI source. Patient transfused 2 units PRBC 4 days ago with subsequent drop in hemoglobin. Hemoglobin at time of admission 5, with baseline hemoglobin 10. Chronic anemia likely secondary to progressive renal dysfunction.  - 2 units PRBC per EDP - Posttransfusion labs - Treatment of GI bleed as below - Contineu aranesp  GI bleed: Patient reports recent malonic stool. FOBT positive in ED. Patient reports previous upper endoscopy and colonoscopy without reported abnormalities. Followed in the outpatient setting by  Dr. Collene Mares, who was consulted by EDP for evaluation. Denies NSAID use. - Clear liquid diet - Nothing  by mouth after midnight - Follow-up GI recommendations - Protonix twice a day  Acute on chronic kidney disease. Cr 4.64. Baseline 3.6. Likely secondary to accommodation of dehydration and hypoperfusion from profound anemia. BUN 87. Continues to make urine. Followed by [facility]. Patient reports having a mature fistula in place for approximately 1 year but has never been used. - Transfusion as above - IVF - BMP in a.m. - If renal function worsens or become uremic then Nephrology consult for dialysis.   HTN: - contineu coreg, norvasc - resume losartan and lasix when renal function returns to nml - Hydralazine PRN  HyperKalemia: chromic condition for pt. 5.3 on admission - continue Veltassa  Chronic UTI: - continue trimpex  h/o NSTEMI/CABG: no CP - Trop x1, EKG - continue crestor,  - Transfusion as above  Anxiety: - continue xanax  DVT prophylaxis: SCD  Code Status: full  Family Communication: none  Disposition Plan: pending workup  Consults called: GI - Dr. Collene Mares  Admission status: inpatient    MERRELL, DAVID J MD [facility]  If 7PM-7AM, please contact night-coverage [email or web address] Password [email or web address]  10/31/2016, 2:12 PM

## 2016-10-31 NOTE — Care Management Note (Addendum)
Case Management Note  Patient Details  Name: [name] MRN: [id number] Date of Birth: 03-04-1941  Subjective/Objective:       Admitted with GI Bleed, hx of  GI bleed, chronic anemia. CKD IV-V ( s/p AVG one year ago/never been used/mature), NSTEMI s/p CABG, diabetes, diabetic neuropathy, GERD, diastolic congestive heart failure.           Mariella Blackwelder (Spouse)     [phone]       PCP; Kathryne Eriksson  Action/Plan: GI to  Follow , pending workup..... CM to f/u with disposition needs.  Expected Discharge Date:                  Expected Discharge Plan:  Home/Self Care  In-House Referral:     Discharge planning Services  CM Consult  Post Acute Care Choice:    Choice offered to:     DME Arranged:    DME Agency:     HH Arranged:    HH Agency:     Status of Service:  In process, will continue to follow  If discussed at Long Length of Stay Meetings, dates discussed:    Additional Comments:  Sharin Mons, RN 10/31/2016, 8:20 PM

## 2016-10-31 NOTE — ED Provider Notes (Signed)
[facility] DEPT Provider Note   CSN: [id number] Arrival date & time: 10/31/16  1135     History   Chief Complaint Chief Complaint  Patient presents with  . Rectal Bleeding  . Abnormal Lab    Hgb 4.5 at PCP    HPI [name] is a 76 y.o. female.  HPI  Patient presents with weakness, lightheadedness, pallor. Patient is a notable history of GI bleed about one week ago, received transfusion. She notes that in the interim she was generally better, with more energy. How however, over the past 24 hours, perhaps longer, she has felt return of weakness, with mild ongoing nausea, without substantial focal pain anywhere, though with generalized discomfort. Patient saw her regular care physician today, and after evaluation it was concerning for GI bleed, she was sent here for evaluation. She is here with her husband who assists with the history of present illness.   Past Medical History:  Diagnosis Date  . Anemia    Pt is taking iron.   [name] Anxiety   . Arthritis   . Carotid stenosis    40-59% bilateral ICA stenosis in 2/12.  . Chronic low back pain   . CKD (chronic kidney disease)    Dr. Audie Clear at [facility]  . Coronary artery disease    Pt presented 2/10 to [facility] with NSTEMI and diastolic CHF exacerbation.  LHC was done  3/10 showing 99% pRCA stenosis and 80% calcified pLAD stenosis with L=>R collaterals.  Pt was referred  for CABG which was done by Dr. Prescott Gum with LIMA-LAD, SVG-RCA, SVG-OM.  . Diabetes mellitus   . Diabetic neuropathy ([place])   . Diastolic CHF (HCC)    Echo (2/10) showed EF 55-65%, mild LVH, diastolic dysfunction, mild AS with mean gradient 12 mmHg, PASP 43 mmHg.  Echo (2/12): EF 55-60%, mild LVH, mild AS (mean gradient 12), PA systolic pressure 32 mmHg.     [name] GERD (gastroesophageal reflux disease)   . Heart murmur   . Hyperlipidemia   . Hypertension   . Mild aortic stenosis    mean gradient 12 mmHg in 2/12.  . Myocardial infarction      "mild"  . Pneumonia   . PONV (postoperative nausea and vomiting)   . Thrombocytopenia ([place])   . Unsteady gait     Patient Active Problem List   Diagnosis Date Noted  . CVA (cerebral vascular accident) ([place]) 09/26/2016  . End stage renal disease ([place]) 06/23/2015  . Chronic kidney disease (CKD), stage IV (severe) ([place]) 06/23/2015  . Balance problem 12/22/2013  . Edema 12/28/2011  . CLAUDICATION 11/01/2010  . CAROTID ARTERY STENOSIS 10/29/2009  . AORTIC STENOSIS 09/17/2009  . Essential hypertension 11/17/2008  . DIASTOLIC HEART FAILURE, CHRONIC 11/17/2008  . DIAB W/UNS COMP TYPE II/UNS NOT STATED UNCNTRL 11/12/2008  . Other and unspecified hyperlipidemia 11/12/2008  . Anemia 11/12/2008  . Thrombocytopenia ([place]) 11/12/2008  . ANXIETY STATE, UNSPECIFIED 11/12/2008  . CAD 11/12/2008  . UNSPEC COMBINED SYSTOLIC&DIASTOLIC HEART FAILURE 33/00/7622  . ESOPHAGEAL REFLUX 11/12/2008  . Chronic kidney disease 11/12/2008  . LOW BACK PAIN, CHRONIC 11/12/2008  . INSOMNIA UNSPECIFIED 11/12/2008    Past Surgical History:  Procedure Laterality Date  . AV FISTULA PLACEMENT Left 01/30/2015   Procedure: Creation of a Radial Cephalic AV Fistula left wrist;  Surgeon: [name], MD;  Location: [place];  Service: Vascular;  Laterality: Left;  . BACK SURGERY     multiple  . BREAST SURGERY  biopsy  . CARDIAC CATHETERIZATION    . CATARACT EXTRACTION W/ INTRAOCULAR LENS  IMPLANT, BILATERAL    . COLONOSCOPY W/ BIOPSIES AND POLYPECTOMY    . CORONARY ARTERY BYPASS GRAFT  09/2008   pt with NSTEMI and diastolic CHF exacerbation.  LHC was done  3/10 showing 99% pRCA stenosis and 80% calcified pLAD stenosis with L=>R collaterals.  Pt was referred  for CABG which was done by Dr. Prescott Gum with LIMA-LAD, SVG-RCA, SVG-OM.  [name] REVISON OF ARTERIOVENOUS FISTULA Left 07/02/2015   Procedure: REVISON OF LEFT RADIOCEPHALIC ARTERIOVENOUS FISTULA;  Surgeon: [name], MD;  Location: [facility];  Service: Vascular;   Laterality: Left;    OB History    No data available       Home Medications    Prior to Admission medications   Medication Sig Start Date End Date Taking? Authorizing Provider  ALPRAZolam Duanne Moron) 0.5 MG tablet Take 0.5 mg by mouth at bedtime.     Historical Provider, MD  amLODipine (NORVASC) 5 MG tablet Take 1 tablet (5 mg total) by mouth daily. 01/20/15   [name], NP  aspirin 325 MG tablet Take 1 tablet (325 mg total) by mouth daily. 09/28/16   Geradine Girt, DO  calcium acetate (PHOSLO) 667 MG capsule Take 1,334 mg by mouth 3 (three) times daily with meals. As directed 12/17/14   Historical Provider, MD  carvedilol (COREG) 12.5 MG tablet TAKE 1 TABLET(12.5 MG) BY MOUTH TWICE DAILY 03/14/16   [name], NP  cholecalciferol (VITAMIN D) 1000 UNITS tablet Take 1,000 Units by mouth daily.    Historical Provider, MD  furosemide (LASIX) 20 MG tablet Take 20 mg by mouth daily. As directed 12/17/14   Historical Provider, MD  losartan (COZAAR) 25 MG tablet Take 1 tablet (25 mg total) by mouth daily. 04/22/14   Larey Dresser, MD  rosuvastatin (CRESTOR) 5 MG tablet TAKE 1 TABLET(5 MG) BY MOUTH DAILY 07/05/16   [name], NP  sodium bicarbonate 650 MG tablet Take 650 mg by mouth 2 (two) times daily.    Historical Provider, MD  trimethoprim (TRIMPEX) 100 MG tablet Take 50 mg by mouth daily.  11/07/14   Historical Provider, MD  VELTASSA 8.4 G packet Take 8.4 g by mouth daily after lunch.  05/28/15   Historical Provider, MD    Family History Family History  Problem Relation Age of Onset  . Adopted: Yes  . Family history unknown: Yes    Social History Social History  Substance Use Topics  . Smoking status: Former Smoker    Types: Cigarettes  . Smokeless tobacco: Never Used     Comment: quit 1988  . Alcohol use Yes     Comment: beer daily     Allergies   Lipitor [atorvastatin] and Strawberry extract   Review of Systems Review of Systems  Constitutional:       Per  HPI, otherwise negative  HENT:       Per HPI, otherwise negative  Respiratory:       Per HPI, otherwise negative  Cardiovascular:       Per HPI, otherwise negative  Gastrointestinal: Positive for blood in stool. Negative for vomiting.  Endocrine:       Negative aside from HPI  Genitourinary:       Neg aside from HPI   Musculoskeletal:       Per HPI, otherwise negative  Skin: Positive for pallor.  Neurological: Positive for weakness and  light-headedness. Negative for syncope.     Physical Exam Updated Vital Signs BP (!) 137/53   Pulse 69   Temp (!) 96.4 F (35.8 C) (Oral)   Resp 16   Ht 5\' 8"  (1.727 m)   Wt 133 lb (60.3 kg)   SpO2 100%   BMI 20.22 kg/m   Physical Exam  Constitutional: She is oriented to person, place, and time. She has a sickly appearance. No distress.  HENT:  Head: Normocephalic and atraumatic.  Eyes: Conjunctivae and EOM are normal.  Cardiovascular: Normal rate and regular rhythm.   Pulmonary/Chest: Effort normal and breath sounds normal. No stridor. No respiratory distress.  Abdominal: She exhibits no distension.  Genitourinary: Rectum normal. Rectal exam shows anal tone normal.  Genitourinary Comments: Hemoccult performed, black, maroon color  Musculoskeletal: She exhibits no edema.  Neurological: She is alert and oriented to person, place, and time. No cranial nerve deficit.  Skin: Skin is warm and dry. There is pallor.  Psychiatric: She has a normal mood and affect.  Nursing note and vitals reviewed.    ED Treatments / Results  Labs (all labs ordered are listed, but only abnormal results are displayed) Labs Reviewed  COMPREHENSIVE METABOLIC PANEL - Abnormal; Notable for the following:       Result Value   Sodium 134 (*)    Potassium 5.3 (*)    CO2 13 (*)    Glucose, Bld 159 (*)    BUN 87 (*)    Creatinine, Ser 4.64 (*)    Calcium 8.4 (*)    Total Protein 5.2 (*)    Albumin 3.2 (*)    AST 14 (*)    ALT 11 (*)    Alkaline  Phosphatase 31 (*)    Total Bilirubin 0.2 (*)    GFR calc non Af Amer 8 (*)    GFR calc Af Amer 10 (*)    All other components within normal limits  CBC - Abnormal; Notable for the following:    WBC 11.2 (*)    RBC 1.54 (*)    Hemoglobin 5.0 (*)    HCT 15.1 (*)    RDW 20.6 (*)    All other components within normal limits  POC OCCULT BLOOD, ED - Abnormal; Notable for the following:    Fecal Occult Bld POSITIVE (*)    All other components within normal limits  CBC  TROPONIN I  TYPE AND SCREEN  PREPARE RBC (CROSSMATCH)     Chart and paperwork reviewed. Patient has notable history of recent transfusion Today, the patient was found to be anemic at the office. The patient's physician, Dr. Redmond Pulling called her gastroenterologist (Dr. Collene Mares), who is aware of the patient's arrival.  EKG  EKG Interpretation  Date/Time:  Monday October 31 2016 14:28:33 EDT Ventricular Rate:  71 PR Interval:    QRS Duration: 125 QT Interval:  400 QTC Calculation: 435 R Axis:   44 Text Interpretation:  junctional rhythm or sinus rhythm w low amplitude P-waves similar to multiple prior studies. Abnormal ekg Confirmed by Carmin Muskrat  MD [phone]) on 10/31/2016 3:15:09 PM     Initial labs notable for hemoglobin of 5, acute worsening of her renal dysfunction, with a creatinine greater than 4.     12:53 PM Patient awake, aware of labs, need for transfusion.  Procedures Procedures (including critical care time)  Medications Ordered in ED Medications  0.9 %  sodium chloride infusion (not administered)   I discussed patient's case with our  hospitalist colleagues for admission. Patient has been typed for blood transfusion, will begin transfusion now.   Initial Impression / Assessment and Plan / ED Course  I have reviewed the triage vital signs and the nursing notes.  Pertinent labs & imaging results that were available during my care of the patient were reviewed by me and considered in my medical  decision making (see chart for details).  Patient with a known history of renal dysfunction presents with new weakness, is found to have pallor, Hemoccult-positive stool, and substantial anemia. Patient was also found to have worsening of her chronic kidney disease, with a notable increase on her creatinine since about one week ago. Given these multiple abnormalities, the patient started a blood transfusion in the emergency department, and after discussion with the admitting team was admitted for further evaluation and management.   Final Clinical Impressions(s) / ED Diagnoses  GI bleed Renal dysfunction Symptomatic anemia   CRITICAL CARE Performed by: Carmin Muskrat Total critical care time: 35 minutes Critical care time was exclusive of separately billable procedures and treating other patients. Critical care was necessary to treat or prevent imminent or life-threatening deterioration. Critical care was time spent personally by me on the following activities: development of treatment plan with patient and/or surrogate as well as nursing, discussions with consultants, evaluation of patient's response to treatment, examination of patient, obtaining history from patient or surrogate, ordering and performing treatments and interventions, ordering and review of laboratory studies, ordering and review of radiographic studies, pulse oximetry and re-evaluation of patient's condition.     Carmin Muskrat, MD 10/31/16 [phone]

## 2016-10-31 NOTE — ED Notes (Signed)
Dr.Lockwood at bedside  

## 2016-10-31 NOTE — ED Notes (Signed)
Critical Hgb 5.0 from Lab. Dr. Vanita Panda aware.

## 2016-10-31 NOTE — ED Triage Notes (Signed)
Pt presents from her PCP with c/o frank rectal bleeding, hypotension, and low hgb of 4.5.  Per EMS pt was seen and given 2 units PRBC's last week for same issue and was d/c'd home. Pt also c/o central abdominal pain and nausea - nausea has improved after IM phenergan from PCP and 4mg  IV Zofran from EMS. Pt is A&Ox4 and pale with cyanotic fingertips. Pt has LUE fistula that was recently placed for future dialysis use.

## 2016-10-31 NOTE — ED Notes (Signed)
Husband phone #-- [phone] Son [phone] Ok Edwards)

## 2016-10-31 NOTE — ED Notes (Signed)
Attempted report 

## 2016-10-31 NOTE — Progress Notes (Signed)
CRITICAL VALUE ALERT  Critical value received:  Troponin 0.08  Date of notification:  10/31/16  Time of notification:  2317  Critical value read back:Yes.    Nurse who received alert:  Lubertha South  MD notified (1st page):  Schorr,NP  Time of first page:  2321  Responding MD:  Carrie Mew  Time MD responded:  2329 [place] placed order for another troponin level to be drawn. Will continue to monitor and treat per MD orders.

## 2016-11-01 LAB — BASIC METABOLIC PANEL
ANION GAP: 11 (ref 5–15)
BUN: 82 mg/dL — ABNORMAL HIGH (ref 6–20)
CALCIUM: 8.2 mg/dL — AB (ref 8.9–10.3)
CO2: 14 mmol/L — AB (ref 22–32)
Chloride: 112 mmol/L — ABNORMAL HIGH (ref 101–111)
Creatinine, Ser: 4.35 mg/dL — ABNORMAL HIGH (ref 0.44–1.00)
GFR, EST AFRICAN AMERICAN: 11 mL/min — AB (ref >60)
GFR, EST NON AFRICAN AMERICAN: 9 mL/min — AB (ref >60)
Glucose, Bld: 99 mg/dL (ref 65–99)
Potassium: 4.6 mmol/L (ref 3.5–5.1)
SODIUM: 137 mmol/L (ref 135–145)

## 2016-11-01 LAB — CBC
HEMATOCRIT: 20.7 % — AB (ref 36.0–46.0)
Hemoglobin: 7 g/dL — ABNORMAL LOW (ref 12.0–15.0)
MCH: 31.3 pg (ref 26.0–34.0)
MCHC: 33.8 g/dL (ref 30.0–36.0)
MCV: 92.4 fL (ref 78.0–100.0)
PLATELETS: 113 10*3/uL — AB (ref 150–400)
RBC: 2.24 MIL/uL — ABNORMAL LOW (ref 3.87–5.11)
RDW: 18.5 % — AB (ref 11.5–15.5)
WBC: 7.6 10*3/uL (ref 4.0–10.5)

## 2016-11-01 LAB — TROPONIN I: Troponin I: 0.15 ng/mL (ref <0.03)

## 2016-11-01 LAB — PREPARE RBC (CROSSMATCH)

## 2016-11-01 MED ORDER — DARBEPOETIN ALFA 300 MCG/0.6ML IJ SOSY
300.0000 ug | PREFILLED_SYRINGE | Freq: Once | INTRAMUSCULAR | Status: DC
Start: 2016-12-07 — End: 2016-11-03

## 2016-11-01 MED ORDER — SODIUM CHLORIDE 0.9 % IV SOLN: INTRAVENOUS | Status: DC

## 2016-11-01 MED ORDER — SODIUM CHLORIDE 0.9 % IV SOLN
Freq: Once | INTRAVENOUS | Status: AC
  Administered 2016-11-01: 11:00:00 via INTRAVENOUS

## 2016-11-01 NOTE — Progress Notes (Addendum)
PROGRESS NOTE  [name]  WNU:[id number] DOB: Dec 02, 1940 DOA: 10/31/2016 PCP: [name], MD  Outpatient Specialists: Dr. Collene Mares, GI  Brief Narrative: [name] is a 76 y.o. 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 She had been started on ASA after CVA last month and had received 2u PRBCs 4 days prior to admission. Last colonoscopy in 2011by Dr. Collene Mares showed scattered diverticula. [name] Unsure of last endoscopy. No NSAID use. Not on dialysis but w/ fistula in place for >20yr. Urinates several times per day.    Assessment & Plan: Active Problems:   Anemia, chronic renal failure   Anxiety state   Essential hypertension   Coronary atherosclerosis   Chronic kidney disease (CKD), stage IV (severe) (HCC)   GI bleed   Blood loss anemia   AKI (acute kidney injury) (HCC)  Symptomatic anemia: Likely secondary to blood loss from GI source superimposed on chronic anemia (baseline hgb ~10) of CKD.  - Hgb 5 > 7 w/2u PRBCs, still symptomatic, with transfusion threshold of 9 w/CAD and troponin elevation will repeat 2u PRBCs and recheck in PM - Continue aranesp  GI bleed: Melena x1 week and +FOBT on admission.  - Continue holding NSAIDs (for GI and renal indications) - Clear liquid diet, NPO p MN for EGD 3/21 - Continue PPI  Acute on chronic kidney disease. Cr 4.64. Baseline 3.6. BUN 87, GI bleed contributing, without uremic symptoms. Making urine. Followed by [facility]. Has AVF in LUE.  - IVF - BMP in a.m. - If renal function worsens or become uremic then Nephrology consult for dialysis.   HTN: - Continue coreg, hold norvasc with relative hypotension and GI bleeding. - Resume losartan and lasix when renal function returns to nml - Hydralazine  PRN  Hyperkalemia: chromic condition for pt. 5.3 on admission, down to normal 3/20. - Continue veltassa  Chronic UTI: - Continue trimpex  CAD s/p CABG: no CP, troponin elevated very mildly more consistent with demand ischemia in setting of hypovolemia and anemia. ECG similar to priors without ST segment depressions/elevations. - Continue crestor - Transfusion as above  Chronic HFpEF: G2DD on last echocardiogram.  - Monitor volume status closely with multiple transfusions.  - Strict I/O, daily weight.  - Will DC IVF while taking po.   Anxiety: - Continue xanax  DVT prophylaxis: SCDs Code Status: Full Family Communication: Husband at bedside Disposition Plan: EGD in AM. Anticipate DC to home once work up complete and pt stable.   Consultants:   GI, Dr. Benson Norway  Procedures:   EGD 3/21  Antimicrobials:  None   Subjective: Patient feels less weak than at admission, still not nearly to baseline. Denies chest pain, dyspnea, palpitations, leg swelling, or noticing any bleeding today. Last BM was late last night, dark/coffee-ground with red in it.   Objective: Vitals:   11/01/16 1319 11/01/16 1348 11/01/16 1416 11/01/16 1629  BP: (!) 138/47 (!) 144/44 (!) 142/46 (!) 153/53  Pulse: 69 66 70 73  Resp: 16 16  16   Temp: 98.4 F (36.9 C) 98.2 F (36.8 C) 98.1 F (36.7 C) 98.7 F (37.1 C)  TempSrc: Oral Oral Oral Oral  SpO2: 99% 100% 99%   Weight:      Height:  Intake/Output Summary (Last 24 hours) at 11/01/16 1646 Last data filed at 11/01/16 1615  Gross per 24 hour  Intake          1969.75 ml  Output              400 ml  Net          1569.75 ml   Filed Weights   10/31/16 1143 10/31/16 1606  Weight: 60.3 kg (133 lb) 59.4 kg (131 lb)    Examination: General exam: 76 y.o. female in no distress, pale Respiratory system: Non-labored breathing room air. Clear to auscultation bilaterally.  Cardiovascular system: Regular rate and rhythm. No murmur, rub, or  gallop. No JVD, and no pedal edema. Gastrointestinal system: Abdomen soft, non-tender, non-distended, with normoactive bowel sounds. No organomegaly or masses felt. Central nervous system: Alert and oriented. No focal neurological deficits. Extremities: Warm, no deformities. LUE AVF +thrill in forearm. Skin: No rashes, lesions no ulcers Psychiatry: Judgement and insight appear normal. Mood & affect appropriate.   Data Reviewed: I have personally reviewed following labs and imaging studies  CBC:  Recent Labs Lab 10/26/16 0944 10/31/16 1159 10/31/16 2224 11/01/16 0632  WBC 5.7 11.2* 11.5* 7.6  NEUTROABS 3.4  --   --   --   HGB 6.7* 5.0* 7.9* 7.0*  HCT 20.9* 15.1* 23.7* 20.7*  MCV 103.0* 98.1 92.2 92.4  PLT 90* 180 137* 161*   Basic Metabolic Panel:  Recent Labs Lab 10/31/16 1159 11/01/16 0632  NA 134* 137  K 5.3* 4.6  CL 108 112*  CO2 13* 14*  GLUCOSE 159* 99  BUN 87* 82*  CREATININE 4.64* 4.35*  CALCIUM 8.4* 8.2*   GFR: Estimated Creatinine Clearance: 10.6 mL/min (A) (by C-G formula based on SCr of 4.35 mg/dL (H)). Liver Function Tests:  Recent Labs Lab 10/31/16 1159  AST 14*  ALT 11*  ALKPHOS 31*  BILITOT 0.2*  PROT 5.2*  ALBUMIN 3.2*   No results for input(s): LIPASE, AMYLASE in the last 168 hours. No results for input(s): AMMONIA in the last 168 hours. Coagulation Profile: No results for input(s): INR, PROTIME in the last 168 hours. Cardiac Enzymes:  Recent Labs Lab 10/31/16 2224 11/01/16 0632  TROPONINI 0.08* 0.15*   BNP (last 3 results) No results for input(s): PROBNP in the last 8760 hours. HbA1C: No results for input(s): HGBA1C in the last 72 hours. CBG: No results for input(s): GLUCAP in the last 168 hours. Lipid Profile: No results for input(s): CHOL, HDL, LDLCALC, TRIG, CHOLHDL, LDLDIRECT in the last 72 hours. Thyroid Function Tests: No results for input(s): TSH, T4TOTAL, FREET4, T3FREE, THYROIDAB in the last 72 hours. Anemia  Panel: No results for input(s): VITAMINB12, FOLATE, FERRITIN, TIBC, IRON, RETICCTPCT in the last 72 hours. Urine analysis:    Component Value Date/Time   COLORURINE YELLOW 09/26/2016 1744   APPEARANCEUR TURBID (A) 09/26/2016 1744   LABSPEC 1.005 09/26/2016 1744   PHURINE 5.0 09/26/2016 1744   GLUCOSEU NEGATIVE 09/26/2016 1744   HGBUR SMALL (A) 09/26/2016 1744   BILIRUBINUR NEGATIVE 09/26/2016 1744   KETONESUR NEGATIVE 09/26/2016 1744   PROTEINUR 100 (A) 09/26/2016 1744   UROBILINOGEN 0.2 10/14/2008 0612   NITRITE NEGATIVE 09/26/2016 1744   LEUKOCYTESUR LARGE (A) 09/26/2016 1744   No results found for this or any previous visit (from the past 240 hour(s)).    Radiology Studies: No results found.  Scheduled Meds: . ALPRAZolam  0.5 mg Oral QHS  . amLODipine  5 mg Oral Daily  .  calcium acetate  1,334 mg Oral TID WC  . carvedilol  12.5 mg Oral BID WC  . [START ON 12/07/2016] Darbepoetin Alfa  300 mcg Subcutaneous Once  . pantoprazole (PROTONIX) IV  40 mg Intravenous Q12H  . patiromer  8.4 g Oral QPC lunch  . rosuvastatin  5 mg Oral Daily  . sodium bicarbonate  650 mg Oral BID  . trimethoprim  50 mg Oral Daily   Continuous Infusions: . sodium chloride 75 mL/hr at 11/01/16 1642  . sodium chloride       LOS: 1 day   Time spent: 25 minutes.  Vance Gather, MD [facility] Pager [phone]  If 7PM-7AM, please contact night-coverage [email or web address] Password [facility] 11/01/2016, 4:46 PM

## 2016-11-01 NOTE — Consult Note (Signed)
Reason for Consult: Symptomatic anemia and Heme positive stool Referring Physician: [facility]  Gesselle T Rothman HPI: This is a 76 year old female with a PMH of CVA (right thalamus 09/2016), anemia, CKD, DM, and carotid stenosis admitted for progressive fatigue.  In the ER her HGB was noted to be in the 6 range and with IV hydration her HGB dropped down to the 5 g/dL.  She does report issues with melena, which started last Monday.  No prior history of a GI bleed.  On 10/26/2016 she received 2 units of PRBC and this was for her IDA secondary to her renal failure.  After her CVA she was started on ASA 325 mg and she feels that this is the source of her anemia.  No complaints of chest pain, SOB, or abdominal pain.  Her last colonoscopy was with Dr. Collene Mares on 01/15/2010 with findings of scattered diverticula.  Past Medical History:  Diagnosis Date  . Anemia    Pt is taking iron.   [name] Anxiety   . Arthritis   . Carotid stenosis    40-59% bilateral ICA stenosis in 2/12.  . Chronic low back pain   . CKD (chronic kidney disease)    Dr. Audie Clear at [facility]  . Coronary artery disease    Pt presented 2/10 to [facility] with NSTEMI and diastolic CHF exacerbation.  LHC was done  3/10 showing 99% pRCA stenosis and 80% calcified pLAD stenosis with L=>R collaterals.  Pt was referred  for CABG which was done by Dr. Prescott Gum with LIMA-LAD, SVG-RCA, SVG-OM.  . Diabetes mellitus   . Diabetic neuropathy ([place])   . Diastolic CHF (HCC)    Echo (2/10) showed EF 55-65%, mild LVH, diastolic dysfunction, mild AS with mean gradient 12 mmHg, PASP 43 mmHg.  Echo (2/12): EF 55-60%, mild LVH, mild AS (mean gradient 12), PA systolic pressure 32 mmHg.     [name] GERD (gastroesophageal reflux disease)   . Heart murmur   . Hyperlipidemia   . Hypertension   . Mild aortic stenosis    mean gradient 12 mmHg in 2/12.  . Myocardial infarction    "mild"  . Pneumonia   . PONV (postoperative nausea and vomiting)   .  Thrombocytopenia ([place])   . Unsteady gait     Past Surgical History:  Procedure Laterality Date  . AV FISTULA PLACEMENT Left 01/30/2015   Procedure: Creation of a Radial Cephalic AV Fistula left wrist;  Surgeon: [name], MD;  Location: [place];  Service: Vascular;  Laterality: Left;  . BACK SURGERY     multiple  . BREAST SURGERY     biopsy  . CARDIAC CATHETERIZATION    . CATARACT EXTRACTION W/ INTRAOCULAR LENS  IMPLANT, BILATERAL    . COLONOSCOPY W/ BIOPSIES AND POLYPECTOMY    . CORONARY ARTERY BYPASS GRAFT  09/2008   pt with NSTEMI and diastolic CHF exacerbation.  LHC was done  3/10 showing 99% pRCA stenosis and 80% calcified pLAD stenosis with L=>R collaterals.  Pt was referred  for CABG which was done by Dr. Prescott Gum with LIMA-LAD, SVG-RCA, SVG-OM.  [name] REVISON OF ARTERIOVENOUS FISTULA Left 07/02/2015   Procedure: REVISON OF LEFT RADIOCEPHALIC ARTERIOVENOUS FISTULA;  Surgeon: [name], MD;  Location: [facility] OR;  Service: Vascular;  Laterality: Left;    Family History  Problem Relation Age of Onset  . Adopted: Yes  . Family history unknown: Yes    Social History:  reports that she  has quit smoking. Her smoking use included Cigarettes. She has never used smokeless tobacco. She reports that she drinks alcohol. She reports that she does not use drugs.  Allergies:  Allergies  Allergen Reactions  . Lipitor [Atorvastatin] Other (See Comments)    Stomach pain  . Strawberry Extract Rash    Medications:  Scheduled: . ALPRAZolam  0.5 mg Oral QHS  . amLODipine  5 mg Oral Daily  . calcium acetate  1,334 mg Oral TID WC  . carvedilol  12.5 mg Oral BID WC  . [START ON 12/07/2016] Darbepoetin Alfa  300 mcg Subcutaneous Once  . pantoprazole (PROTONIX) IV  40 mg Intravenous Q12H  . patiromer  8.4 g Oral QPC lunch  . rosuvastatin  5 mg Oral Daily  . sodium bicarbonate  650 mg Oral BID  . trimethoprim  50 mg Oral Daily   Continuous: . sodium chloride 75 mL/hr at 11/01/16 6834     Results for orders placed or performed during the hospital encounter of 10/31/16 (from the past 24 hour(s))  Prepare RBC     Status: None   Collection Time: 10/31/16 12:53 PM  Result Value Ref Range   Order Confirmation ORDER PROCESSED BY BLOOD BANK   POC occult blood, ED     Status: Abnormal   Collection Time: 10/31/16  1:10 PM  Result Value Ref Range   Fecal Occult Bld POSITIVE (A) NEGATIVE  CBC     Status: Abnormal   Collection Time: 10/31/16 10:24 PM  Result Value Ref Range   WBC 11.5 (H) 4.0 - 10.5 K/uL   RBC 2.57 (L) 3.87 - 5.11 MIL/uL   Hemoglobin 7.9 (L) 12.0 - 15.0 g/dL   HCT 23.7 (L) 36.0 - 46.0 %   MCV 92.2 78.0 - 100.0 fL   MCH 30.7 26.0 - 34.0 pg   MCHC 33.3 30.0 - 36.0 g/dL   RDW 17.6 (H) 11.5 - 15.5 %   Platelets 137 (L) 150 - 400 K/uL  Troponin I     Status: Abnormal   Collection Time: 10/31/16 10:24 PM  Result Value Ref Range   Troponin I 0.08 (HH) <0.03 ng/mL  CBC     Status: Abnormal   Collection Time: 11/01/16  6:32 AM  Result Value Ref Range   WBC 7.6 4.0 - 10.5 K/uL   RBC 2.24 (L) 3.87 - 5.11 MIL/uL   Hemoglobin 7.0 (L) 12.0 - 15.0 g/dL   HCT 20.7 (L) 36.0 - 46.0 %   MCV 92.4 78.0 - 100.0 fL   MCH 31.3 26.0 - 34.0 pg   MCHC 33.8 30.0 - 36.0 g/dL   RDW 18.5 (H) 11.5 - 15.5 %   Platelets 113 (L) 150 - 400 K/uL  Basic metabolic panel     Status: Abnormal   Collection Time: 11/01/16  6:32 AM  Result Value Ref Range   Sodium 137 135 - 145 mmol/L   Potassium 4.6 3.5 - 5.1 mmol/L   Chloride 112 (H) 101 - 111 mmol/L   CO2 14 (L) 22 - 32 mmol/L   Glucose, Bld 99 65 - 99 mg/dL   BUN 82 (H) 6 - 20 mg/dL   Creatinine, Ser 4.35 (H) 0.44 - 1.00 mg/dL   Calcium 8.2 (L) 8.9 - 10.3 mg/dL   GFR calc non Af Amer 9 (L) >60 mL/min   GFR calc Af Amer 11 (L) >60 mL/min   Anion gap 11 5 - 15  Troponin I     Status: Abnormal  Collection Time: 11/01/16  6:32 AM  Result Value Ref Range   Troponin I 0.15 (HH) <0.03 ng/mL  Prepare RBC     Status: None    Collection Time: 11/01/16  9:45 AM  Result Value Ref Range   Order Confirmation ORDER PROCESSED BY BLOOD BANK      No results found.  ROS:  As stated above in the HPI otherwise negative.  Blood pressure (!) 132/42, pulse 70, temperature 98 F (36.7 C), temperature source Oral, resp. rate 16, height 5\' 8"  (1.727 m), weight 59.4 kg (131 lb), SpO2 100 %.    PE: Gen: NAD, Alert and Oriented, pale appearing HEENT:  [place]/AT, EOMI Neck: Supple, no LAD Lungs: CTA Bilaterally CV: RRR without M/G/R ABM: Soft, NTND, +BS Ext: No C/C/E  Assessment/Plan: 1) Anemia. 2) Melena. 3) Heme positive stool. 4) Elevated troponin - most likely from demand ischemia.  Plan: 1) EGD tomorrow. 2) Continue with PPI. 3) Continue with transfusions.  [name] D 11/01/2016, 12:05 PM

## 2016-11-02 ENCOUNTER — Encounter (HOSPITAL_COMMUNITY)
Admission: EM | Disposition: A | Discharge status: Home / Self Care | Payer: Self-pay | Source: Home / Self Care | Attending: Family Medicine

## 2016-11-02 ENCOUNTER — Inpatient Hospital Stay (HOSPITAL_COMMUNITY): Payer: Medicare HMO | Admitting: Certified Registered Nurse Anesthetist

## 2016-11-02 ENCOUNTER — Encounter (HOSPITAL_COMMUNITY): Payer: Self-pay | Admitting: *Deleted

## 2016-11-02 HISTORY — PX: ESOPHAGOGASTRODUODENOSCOPY: SHX5428

## 2016-11-02 LAB — CBC
HEMATOCRIT: 30.9 % — AB (ref 36.0–46.0)
Hemoglobin: 10.2 g/dL — ABNORMAL LOW (ref 12.0–15.0)
MCH: 30.4 pg (ref 26.0–34.0)
MCHC: 33 g/dL (ref 30.0–36.0)
MCV: 92 fL (ref 78.0–100.0)
PLATELETS: 97 10*3/uL — AB (ref 150–400)
RBC: 3.36 MIL/uL — ABNORMAL LOW (ref 3.87–5.11)
RDW: 17.8 % — AB (ref 11.5–15.5)
WBC: 5.6 10*3/uL (ref 4.0–10.5)

## 2016-11-02 LAB — BPAM RBC
BLOOD PRODUCT EXPIRATION DATE: 201803312359
Blood Product Expiration Date: 201803222359
Blood Product Expiration Date: 201803232359
Blood Product Expiration Date: 201803292359
ISSUE DATE / TIME: 201803191658
ISSUE DATE / TIME: 201803201322
ISSUE DATE / TIME: 201803191325
ISSUE DATE / TIME: 201803201010
UNIT TYPE AND RH: 9500
Unit Type and Rh: 9500
Unit Type and Rh: 9500
Unit Type and Rh: 9500

## 2016-11-02 LAB — TYPE AND SCREEN
ABO/RH(D): O NEG
Antibody Screen: NEGATIVE
UNIT DIVISION: 0
Unit division: 0
Unit division: 0
Unit division: 0

## 2016-11-02 LAB — BASIC METABOLIC PANEL
Anion gap: 9 (ref 5–15)
BUN: 67 mg/dL — ABNORMAL HIGH (ref 6–20)
CALCIUM: 8.2 mg/dL — AB (ref 8.9–10.3)
CO2: 15 mmol/L — AB (ref 22–32)
CREATININE: 4.01 mg/dL — AB (ref 0.44–1.00)
Chloride: 116 mmol/L — ABNORMAL HIGH (ref 101–111)
GFR calc non Af Amer: 10 mL/min — ABNORMAL LOW (ref >60)
GFR, EST AFRICAN AMERICAN: 12 mL/min — AB (ref >60)
Glucose, Bld: 110 mg/dL — ABNORMAL HIGH (ref 65–99)
Potassium: 4.4 mmol/L (ref 3.5–5.1)
SODIUM: 140 mmol/L (ref 135–145)

## 2016-11-02 LAB — HEMOGLOBIN AND HEMATOCRIT, BLOOD
HCT: 29.5 % — ABNORMAL LOW (ref 36.0–46.0)
Hemoglobin: 10.1 g/dL — ABNORMAL LOW (ref 12.0–15.0)

## 2016-11-02 LAB — TROPONIN I: TROPONIN I: 0.17 ng/mL — AB (ref <0.03)

## 2016-11-02 SURGERY — EGD (ESOPHAGOGASTRODUODENOSCOPY)
Anesthesia: Monitor Anesthesia Care

## 2016-11-02 MED ORDER — SODIUM CHLORIDE 0.9 % IV SOLN: INTRAVENOUS | Status: DC

## 2016-11-02 MED ORDER — ACETAMINOPHEN 160 MG/5ML PO SOLN: 325.0000 mg | ORAL | Status: DC | PRN

## 2016-11-02 MED ORDER — ONDANSETRON HCL 4 MG/2ML IJ SOLN: 4.0000 mg | Freq: Once | INTRAMUSCULAR | Status: DC | PRN

## 2016-11-02 MED ORDER — ACETAMINOPHEN 325 MG PO TABS: 325.0000 mg | ORAL_TABLET | ORAL | Status: DC | PRN

## 2016-11-02 MED ORDER — PROPOFOL 500 MG/50ML IV EMUL
INTRAVENOUS | Status: DC | PRN
  Administered 2016-11-02: 75 ug/kg/min via INTRAVENOUS

## 2016-11-02 MED ORDER — MEPERIDINE HCL 25 MG/ML IJ SOLN: 6.2500 mg | INTRAMUSCULAR | Status: DC | PRN

## 2016-11-02 MED ORDER — OXYCODONE HCL 5 MG PO TABS: 5.0000 mg | ORAL_TABLET | Freq: Once | ORAL | Status: DC | PRN

## 2016-11-02 MED ORDER — PEG 3350-KCL-NA BICARB-NACL 420 G PO SOLR
4000.0000 mL | Freq: Once | ORAL | Status: DC
Start: 2016-11-02 — End: 2016-11-03
  Filled 2016-11-02: qty 4000

## 2016-11-02 MED ORDER — SODIUM CHLORIDE 0.9 % IV SOLN
INTRAVENOUS | Status: DC | PRN
  Administered 2016-11-02: 14:00:00 via INTRAVENOUS

## 2016-11-02 MED ORDER — OXYCODONE HCL 5 MG/5ML PO SOLN: 5.0000 mg | Freq: Once | ORAL | Status: DC | PRN

## 2016-11-02 MED ORDER — LIDOCAINE HCL (CARDIAC) 20 MG/ML IV SOLN
INTRAVENOUS | Status: DC | PRN
  Administered 2016-11-02: 80 mg via INTRATRACHEAL

## 2016-11-02 NOTE — Progress Notes (Signed)
PROGRESS NOTE  [name]  JIR:[id number] DOB: 13-May-1941 DOA: 10/31/2016 PCP: [name], MD  Outpatient Specialists: Dr. Collene Mares, GI  Brief Narrative: [name] is a 76 y.o. 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 She had been started on ASA after CVA last month and had received 2u PRBCs 4 days prior to admission. Last colonoscopy in 2011by Dr. Collene Mares showed scattered diverticula. [name] Unsure of last endoscopy. No NSAID use. Not on dialysis but w/ fistula in place for >53yr. Urinates several times per day.    Assessment & Plan: Active Problems:   Anemia, chronic renal failure   Anxiety state   Essential hypertension   Coronary atherosclerosis   Chronic kidney disease (CKD), stage IV (severe) (HCC)   GI bleed   Blood loss anemia   AKI (acute kidney injury) (HCC)  Symptomatic anemia: Likely secondary to blood loss from GI source superimposed on chronic anemia (baseline hgb ~10) of CKD.  - Hgb 5 > 7 > 10 with 4 total units PRBCs. No evidence of overload with this.  - Continue aranesp  GI bleed: Melena x1 week and +FOBT on admission. EGD 3/21 by Dr. Collene Mares negative. Bleeding not ongoing. - Colonoscopy 3/22. - Continue holding NSAIDs (for GI and renal indications) - Clear liquid diet, NPO p MN for colonoscopy - Continue PPI  Acute on chronic kidney disease. Cr 4.64. Baseline 3.6. BUN 87 at admission, GI bleed contributing, without uremic symptoms. Making urine. Followed by [facility]. Has AVF in LUE.  - IVF stopped and BMP improving.  - Continue to monitor. - If renal function worsens or becomes uremic then nephrology consult for dialysis.   HTN: - Continue coreg, hold norvasc with relative hypotension and GI bleeding. - Resume  losartan and lasix when renal function returns to nml - Hydralazine PRN  Hyperkalemia: chromic condition for pt. 5.3 on admission, down to normal 3/20. - Continue veltassa  Chronic UTI: - Continue trimpex  CAD s/p CABG: no CP, troponin elevated very mildly more consistent with demand ischemia in setting of hypovolemia and anemia. ECG similar to priors without ST segment depressions/elevations. - Plan to monitor troponin to ensure flat/down-trending. - Continue crestor - Transfusion as above  Chronic HFpEF: G2DD on last echocardiogram. Euvolemic after 4u PRBCs.  - Monitor volume status closely with multiple transfusions.  - Strict I/O, daily weight.  - Will DC IVF while taking po.   Anxiety: - Continue xanax  DVT prophylaxis: SCDs Code Status: Full Family Communication: Husband at bedside Disposition Plan: Colonoscopy in AM. Anticipate DC to home once work up complete and pt stable.   Consultants:   GI, Dr. Collene Mares  Procedures:   EGD 3/21 - Normal appearing esophagus and GEJ. - Normal stomach. - No specimens collected.  Antimicrobials:  None   Subjective: Patient reporting significant improvement in fatigue. No chest pain dyspnea, leg swelling. No further bleeding clinically.   Objective: Vitals:   11/02/16 1407 11/02/16 1410 11/02/16 1420 11/02/16 1430  BP: (!) 157/48 (!) 164/44  (!) 165/46  Pulse: 72 72 71 72  Resp: 20 19 20 20   Temp:      TempSrc:      SpO2: 100% 98% 98% 97%  Weight:      Height:  Intake/Output Summary (Last 24 hours) at 11/02/16 1620 Last data filed at 11/02/16 1408  Gross per 24 hour  Intake             1330 ml  Output             1000 ml  Net              330 ml   Filed Weights   10/31/16 1606 11/02/16 0300 11/02/16 1320  Weight: 59.4 kg (131 lb) 47.1 kg (103 lb 13.4 oz) 47.1 kg (103 lb 13.4 oz)    Examination: General exam: 76 y.o. female in no distress, pale Respiratory system: Non-labored breathing room air. Clear  to auscultation bilaterally.  Cardiovascular system: Regular rate and rhythm. No murmur, rub, or gallop. No JVD, and no pedal edema. Gastrointestinal system: Abdomen soft, non-tender, non-distended, with normoactive bowel sounds. No organomegaly or masses felt. Central nervous system: Alert and oriented. No focal neurological deficits. Extremities: Warm, no deformities. LUE AVF +thrill in forearm. Skin: No rashes, lesions no ulcers Psychiatry: Judgement and insight appear normal. Mood & affect appropriate.   Data Reviewed: I have personally reviewed following labs and imaging studies  CBC:  Recent Labs Lab 10/31/16 1159 10/31/16 2224 11/01/16 0632 11/01/16 1757 11/02/16 0656  WBC 11.2* 11.5* 7.6  --  5.6  HGB 5.0* 7.9* 7.0* 10.1* 10.2*  HCT 15.1* 23.7* 20.7* 29.5* 30.9*  MCV 98.1 92.2 92.4  --  92.0  PLT 180 137* 113*  --  97*   Basic Metabolic Panel:  Recent Labs Lab 10/31/16 1159 11/01/16 0632 11/02/16 0656  NA 134* 137 140  K 5.3* 4.6 4.4  CL 108 112* 116*  CO2 13* 14* 15*  GLUCOSE 159* 99 110*  BUN 87* 82* 67*  CREATININE 4.64* 4.35* 4.01*  CALCIUM 8.4* 8.2* 8.2*   GFR: Estimated Creatinine Clearance: 9.2 mL/min (A) (by C-G formula based on SCr of 4.01 mg/dL (H)). Liver Function Tests:  Recent Labs Lab 10/31/16 1159  AST 14*  ALT 11*  ALKPHOS 31*  BILITOT 0.2*  PROT 5.2*  ALBUMIN 3.2*   No results for input(s): LIPASE, AMYLASE in the last 168 hours. No results for input(s): AMMONIA in the last 168 hours. Coagulation Profile: No results for input(s): INR, PROTIME in the last 168 hours. Cardiac Enzymes:  Recent Labs Lab 10/31/16 2224 11/01/16 0632 11/02/16 0656  TROPONINI 0.08* 0.15* 0.17*   BNP (last 3 results) No results for input(s): PROBNP in the last 8760 hours. HbA1C: No results for input(s): HGBA1C in the last 72 hours. CBG: No results for input(s): GLUCAP in the last 168 hours. Lipid Profile: No results for input(s): CHOL, HDL,  LDLCALC, TRIG, CHOLHDL, LDLDIRECT in the last 72 hours. Thyroid Function Tests: No results for input(s): TSH, T4TOTAL, FREET4, T3FREE, THYROIDAB in the last 72 hours. Anemia Panel: No results for input(s): VITAMINB12, FOLATE, FERRITIN, TIBC, IRON, RETICCTPCT in the last 72 hours. Urine analysis:    Component Value Date/Time   COLORURINE YELLOW 09/26/2016 1744   APPEARANCEUR TURBID (A) 09/26/2016 1744   LABSPEC 1.005 09/26/2016 1744   PHURINE 5.0 09/26/2016 1744   GLUCOSEU NEGATIVE 09/26/2016 1744   HGBUR SMALL (A) 09/26/2016 1744   BILIRUBINUR NEGATIVE 09/26/2016 1744   KETONESUR NEGATIVE 09/26/2016 1744   PROTEINUR 100 (A) 09/26/2016 1744   UROBILINOGEN 0.2 10/14/2008 0612   NITRITE NEGATIVE 09/26/2016 1744   LEUKOCYTESUR LARGE (A) 09/26/2016 1744   No results found for this or any previous visit (from  the past 240 hour(s)).    Radiology Studies: No results found.  Scheduled Meds: . ALPRAZolam  0.5 mg Oral QHS  . calcium acetate  1,334 mg Oral TID WC  . carvedilol  12.5 mg Oral BID WC  . [START ON 12/07/2016] Darbepoetin Alfa  300 mcg Subcutaneous Once  . pantoprazole (PROTONIX) IV  40 mg Intravenous Q12H  . patiromer  8.4 g Oral QPC lunch  . polyethylene glycol-electrolytes  4,000 mL Oral Once  . rosuvastatin  5 mg Oral Daily  . sodium bicarbonate  650 mg Oral BID  . trimethoprim  50 mg Oral Daily   Continuous Infusions: . sodium chloride       LOS: 2 days   Time spent: 25 minutes.  Vance Gather, MD [facility] Pager [phone]  If 7PM-7AM, please contact night-coverage [email or web address] Password [email or web address] 11/02/2016, 4:20 PM

## 2016-11-02 NOTE — Anesthesia Preprocedure Evaluation (Addendum)
Anesthesia Evaluation  Patient identified by MRN, date of birth, ID band Patient awake    Reviewed: Allergy & Precautions, NPO status , Patient's Chart, lab work & pertinent test results  Airway Mallampati: I       Dental   Pulmonary former smoker,    Pulmonary exam normal        Cardiovascular hypertension, Normal cardiovascular exam     Neuro/Psych    GI/Hepatic   Endo/Other  diabetes  Renal/GU      Musculoskeletal   Abdominal Normal abdominal exam  (+)   Peds  Hematology   Anesthesia Other Findings Study Result   Result status: Final result                             *[facility]*                         [address],  [address]                            [phone]  ------------------------------------------------------------------- Transthoracic Echocardiography  Patient:    [name], [name] MR #:       [id number] Study Date: 09/27/2016 Gender:     F Age:        76 Height:     172.7 cm Weight:     61.4 kg BSA:        1.71 m^2 Pt. Status: Room:       5M14C   ADMITTING    Gwinda Maine  REFERRING    Geradine Girt  PERFORMING   [facility], Inpatient  SONOGRAPHER  Darlina Sicilian, RDCS  cc:  ------------------------------------------------------------------- LV EF: 55% -   60%  ------------------------------------------------------------------- Indications:      CVA 436.  ------------------------------------------------------------------- History:   PMH:  Anemia. Chronic Kidney Disease. Carotid Artery Stenosis.  Murmur.  Coronary artery disease.  PMH:   Myocardial infarction.  Risk factors:  Hypertension. Diabetes mellitus. Dyslipidemia.    Reproductive/Obstetrics                            Anesthesia  Physical Anesthesia Plan  ASA: III  Anesthesia Plan: MAC   Post-op Pain Management:    Induction: Intravenous  Airway Management Planned:   Additional Equipment:   Intra-op Plan:   Post-operative Plan:   Informed Consent: I have reviewed the patients History and Physical, chart, labs and discussed the procedure including the risks, benefits and alternatives for the proposed anesthesia with the patient or authorized representative who has indicated his/her understanding and acceptance.     Plan Discussed with: CRNA and Surgeon  Anesthesia Plan Comments:         Anesthesia Quick Evaluation

## 2016-11-02 NOTE — Transfer of Care (Signed)
Immediate Anesthesia Transfer of Care Note  Patient: [name]  Procedure(s) Performed: Procedure(s): ESOPHAGOGASTRODUODENOSCOPY (EGD) (N/A)  Patient Location: Endoscopy Unit  Anesthesia Type:MAC  Level of Consciousness: awake, alert , oriented, patient cooperative and responds to stimulation  Airway & Oxygen Therapy: Patient Spontanous Breathing and Patient connected to nasal cannula oxygen  Post-op Assessment: Report given to RN, Post -op Vital signs reviewed and stable and Patient moving all extremities X 4  Post vital signs: Reviewed and stable  Last Vitals:  Vitals:   11/02/16 0519 11/02/16 1320  BP: (!) 130/48 (!) 187/62  Pulse: 65 80  Resp: 18 (!) 24  Temp: 36.8 C 36.4 C    Last Pain:  Vitals:   11/02/16 1320  TempSrc: Oral  PainSc:          Complications: No apparent anesthesia complications

## 2016-11-02 NOTE — Anesthesia Procedure Notes (Signed)
Procedure Name: MAC Date/Time: 11/02/2016 1:51 PM Performed by: Tressia Miners LEFFEW Pre-anesthesia Checklist: Patient identified, Emergency Drugs available, Suction available, Timeout performed and Patient being monitored Patient Re-evaluated:Patient Re-evaluated prior to inductionOxygen Delivery Method: Nasal cannula Placement Confirmation: positive ETCO2

## 2016-11-02 NOTE — Progress Notes (Signed)
Subjective: Patient admitted for melenic stools with anemia. For EGD today. Objective: Vital signs in last 24 hours: Temp:  [97.6 F (36.4 C)-98.7 F (37.1 C)] 97.6 F (36.4 C) (03/21 1320) Pulse Rate:  [65-80] 80 (03/21 1320) Resp:  [16-24] 24 (03/21 1320) BP: (130-187)/(46-62) 187/62 (03/21 1320) SpO2:  [97 %-99 %] 99 % (03/21 1320) Weight:  [47.1 kg (103 lb 13.4 oz)] 47.1 kg (103 lb 13.4 oz) (03/21 1320) Last BM Date: 11/01/16  Intake/Output from previous day: 03/20 0701 - 03/21 0700 In: 1386 [P.O.:720; Blood:666] Out: 1400 [Urine:1400] Intake/Output this shift: Total I/O In: 930 [P.O.:930] Out: -   General appearance: alert, cooperative, appears stated age, no distress and pale Resp: clear to auscultation bilaterally Cardio: systolic murmur: 2/6 heard GI: soft, non-tender; bowel sounds normal; no masses,  no organomegaly  Lab Results:  Recent Labs  10/31/16 2224 11/01/16 0632 11/01/16 1757 11/02/16 0656  WBC 11.5* 7.6  --  5.6  HGB 7.9* 7.0* 10.1* 10.2*  HCT 23.7* 20.7* 29.5* 30.9*  PLT 137* 113*  --  97*   BMET  Recent Labs  10/31/16 1159 11/01/16 0632 11/02/16 0656  NA 134* 137 140  K 5.3* 4.6 4.4  CL 108 112* 116*  CO2 13* 14* 15*  GLUCOSE 159* 99 110*  BUN 87* 82* 67*  CREATININE 4.64* 4.35* 4.01*  CALCIUM 8.4* 8.2* 8.2*   LFT  Recent Labs  10/31/16 1159  PROT 5.2*  ALBUMIN 3.2*  AST 14*  ALT 11*  ALKPHOS 31*  BILITOT 0.2*    Medications: I have reviewed the patient's current medications.  Assessment/Plan: Melena and anemia-proceed with an EGD.  LOS: 2 days   [name] 11/02/2016, 1:50 PM

## 2016-11-02 NOTE — Op Note (Signed)
[facility] Patient Name: [name] Procedure Date : 11/02/2016 MRN: [id number] Attending MD: [name] , MD Date of Birth: 1941/04/26 CSN: [id number] Age: 76 Admit Type: Inpatient Procedure:                Diagnostic EGD. Indications:              Iron deficiency anemia, Hematochezia, Melena Providers:                [name], MD, [name], RN, Cherylynn Ridges,                            Technician, Tressia Miners, CRNA Referring MD:             [name]. Redmond Pulling, MD Medicines:                Monitored Anesthesia Care Complications:            No immediate complications. Estimated Blood Loss:     Estimated blood loss: none. Procedure:                Pre-anesthesia assessment: - Prior to the                            procedure, a history and physical was performed,                            and patient medications and allergies were                            reviewed. The patient's tolerance of previous                            anesthesia was also reviewed. The risks and                            benefits of the procedure and the sedation options                            and risks were discussed with the patient. All                            questions were answered, and informed consent was                            obtained. Prior anticoagulants: The patient has                            taken aspirin, last dose was 3 days prior to                            procedure. ASA Grade assessment: III - A patient                            with severe systemic disease. After reviewing the  risks and benefits, the patient was deemed in                            satisfactory condition to undergo the procedure.                            After obtaining informed consent, the endoscope was                            passed under direct vision. Throughout the                            procedure, the patient's blood pressure, pulse, and                           oxygen saturations were monitored continuously. The                            EG-[id number] ([id number]) scope was introduced through the                            mouth, and advanced to the second part of duodenum.                            The EGD was accomplished without difficulty. The                            patient tolerated the procedure well. Scope In: Scope Out: Findings:      The examined esophagus and GEJ appeared widely patent and normal.      The entire examined stomach was normal.      The cardia and gastric fundus were normal on retroflexion.      The examined duodenum was normal. Impression:               - Normal appearing esophagus and GEJ.                           - Normal stomach.                           - No specimens collected. Moderate Sedation:      MAC used. Recommendation:           - Clear liquid diet today.                           - Perform a colonoscopy tomorrow.                           - Continue present medications. Procedure Code(s):        --- Professional ---                           [phone], Esophagogastroduodenoscopy, flexible,                            transoral;  diagnostic, including collection of                            specimen(s) by brushing or washing, when performed                            (separate procedure) Diagnosis Code(s):        --- Professional ---                           D50.9, Iron deficiency anemia, unspecified                           K92.1, Melena (includes Hematochezia) CPT copyright 2016 American [employer]. All rights reserved. The codes documented in this report are preliminary and upon coder review may  be revised to meet current compliance requirements. [name], MD [name], MD 11/02/2016 2:25:09 PM This report has been signed electronically. Number of Addenda: 0

## 2016-11-02 NOTE — Anesthesia Postprocedure Evaluation (Addendum)
Anesthesia Post Note  Patient: [name]  Procedure(s) Performed: Procedure(s) (LRB): ESOPHAGOGASTRODUODENOSCOPY (EGD) (N/A)  Anesthesia Type: MAC Level of consciousness: awake Pain management: pain level controlled Vital Signs Assessment: post-procedure vital signs reviewed and stable Cardiovascular status: stable Postop Assessment: no signs of nausea or vomiting Anesthetic complications: no        Last Vitals:  Vitals:   11/02/16 1407 11/02/16 1410  BP: (!) 157/48 (!) 164/44  Pulse: 72 72  Resp: 20 19  Temp:      Last Pain:  Vitals:   11/02/16 1320  TempSrc: Oral  PainSc:    Pain Goal:                 Reather Steller JR,JOHN Nahshon Reich

## 2016-11-03 ENCOUNTER — Encounter (HOSPITAL_COMMUNITY): Payer: Self-pay

## 2016-11-03 ENCOUNTER — Inpatient Hospital Stay (HOSPITAL_COMMUNITY): Payer: Medicare HMO | Admitting: Certified Registered"

## 2016-11-03 ENCOUNTER — Encounter (HOSPITAL_COMMUNITY)
Admission: EM | Disposition: A | Discharge status: Home / Self Care | Payer: Self-pay | Source: Home / Self Care | Attending: Family Medicine

## 2016-11-03 HISTORY — PX: COLONOSCOPY: SHX5424

## 2016-11-03 LAB — BASIC METABOLIC PANEL
ANION GAP: 10 (ref 5–15)
BUN: 50 mg/dL — ABNORMAL HIGH (ref 6–20)
CALCIUM: 8.1 mg/dL — AB (ref 8.9–10.3)
CHLORIDE: 110 mmol/L (ref 101–111)
CO2: 17 mmol/L — ABNORMAL LOW (ref 22–32)
Creatinine, Ser: 3.62 mg/dL — ABNORMAL HIGH (ref 0.44–1.00)
GFR calc Af Amer: 13 mL/min — ABNORMAL LOW (ref >60)
GFR calc non Af Amer: 11 mL/min — ABNORMAL LOW (ref >60)
GLUCOSE: 112 mg/dL — AB (ref 65–99)
Potassium: 4.1 mmol/L (ref 3.5–5.1)
Sodium: 137 mmol/L (ref 135–145)

## 2016-11-03 LAB — CBC
HEMATOCRIT: 33.7 % — AB (ref 36.0–46.0)
Hemoglobin: 11.1 g/dL — ABNORMAL LOW (ref 12.0–15.0)
MCH: 30.7 pg (ref 26.0–34.0)
MCHC: 32.9 g/dL (ref 30.0–36.0)
MCV: 93.1 fL (ref 78.0–100.0)
Platelets: 107 10*3/uL — ABNORMAL LOW (ref 150–400)
RBC: 3.62 MIL/uL — ABNORMAL LOW (ref 3.87–5.11)
RDW: 17.8 % — AB (ref 11.5–15.5)
WBC: 6.1 10*3/uL (ref 4.0–10.5)

## 2016-11-03 LAB — TROPONIN I: TROPONIN I: 0.15 ng/mL — AB (ref <0.03)

## 2016-11-03 SURGERY — COLONOSCOPY
Anesthesia: Monitor Anesthesia Care | Laterality: Left

## 2016-11-03 MED ORDER — LIDOCAINE HCL (CARDIAC) 20 MG/ML IV SOLN
INTRAVENOUS | Status: DC | PRN
  Administered 2016-11-03: 40 mg via INTRAVENOUS

## 2016-11-03 MED ORDER — PHENYLEPHRINE HCL 10 MG/ML IJ SOLN
INTRAMUSCULAR | Status: DC | PRN
  Administered 2016-11-03: 80 ug via INTRAVENOUS

## 2016-11-03 MED ORDER — SODIUM CHLORIDE 0.9 % IV SOLN
INTRAVENOUS | Status: DC | PRN
  Administered 2016-11-03: 13:00:00 via INTRAVENOUS

## 2016-11-03 MED ORDER — PROPOFOL 10 MG/ML IV BOLUS
INTRAVENOUS | Status: DC | PRN
  Administered 2016-11-03 (×2): 10 mg via INTRAVENOUS

## 2016-11-03 MED ORDER — PROPOFOL 500 MG/50ML IV EMUL
INTRAVENOUS | Status: DC | PRN
  Administered 2016-11-03: 75 ug/kg/min via INTRAVENOUS

## 2016-11-03 MED ORDER — PANTOPRAZOLE SODIUM 20 MG PO TBEC: 20.0000 mg | DELAYED_RELEASE_TABLET | Freq: Every day | ORAL | 0 refills | Status: DC

## 2016-11-03 NOTE — Anesthesia Preprocedure Evaluation (Addendum)
Anesthesia Evaluation  Patient identified by MRN, date of birth, ID band Patient awake    Reviewed: Allergy & Precautions, NPO status , Patient's Chart, lab work & pertinent test results, reviewed documented beta blocker date and time   History of Anesthesia Complications (+) PONV and history of anesthetic complications  Airway Mallampati: II  TM Distance: >3 FB Neck ROM: Full    Dental  (+) Teeth Intact, Dental Advisory Given   Pulmonary former smoker,    Pulmonary exam normal breath sounds clear to auscultation       Cardiovascular hypertension, Pt. on home beta blockers and Pt. on medications + CAD, + Past MI, + CABG, + Peripheral Vascular Disease and +CHF  + Valvular Problems/Murmurs AS  Rhythm:Regular Rate:Normal + Systolic murmurs Echo 8/50/27: Study Conclusions  - Left ventricle: The cavity size was normal. There was moderate focal basal and mild concentric hypertrophy. Systolic function was normal. The estimated ejection fraction was in the range of 55% to 60%. Wall motion was normal; there were no regional wall motion abnormalities. Features are consistent with a pseudonormal left ventricular filling pattern, with concomitant abnormal   relaxation and increased filling pressure (grade 2 diastolic dysfunction). - Aortic valve: The AV appears mildly to moderately stenotic by doppler with a mean AV gradient of 66mHg. The calculated AVA is 0.9cm2 and likely underestimates with AVA due to inaccurate measurment of the LVOT. Visually there appears to be mild to moderate AS. Valve area (VTI): 0.9 cm^2. Valve area (Vmax): 0.89 cm^2. Valve area (Vmean): 0.86 cm^2. - Mitral valve: There was trivial regurgitation. - Right ventricle: Systolic function was mildly reduced. - Pulmonic valve: There was mild regurgitation. - Pulmonary arteries: PA peak pressure: 33 mm Hg (S).   Neuro/Psych PSYCHIATRIC DISORDERS Anxiety CVA     GI/Hepatic Neg liver ROS, GERD  ,  Endo/Other  diabetes (diabetic neuropathy), Type 2  Renal/GU Renal InsufficiencyRenal disease     Musculoskeletal  (+) Arthritis ,   Abdominal   Peds  Hematology  (+) Blood dyscrasia (Thrombocytopenia--plt 107k), anemia ,   Anesthesia Other Findings Day of surgery medications reviewed with the patient.  Reproductive/Obstetrics                            Anesthesia Physical Anesthesia Plan  ASA: III  Anesthesia Plan: MAC   Post-op Pain Management:    Induction: Intravenous  Airway Management Planned: Nasal Cannula  Additional Equipment:   Intra-op Plan:   Post-operative Plan:   Informed Consent: I have reviewed the patients History and Physical, chart, labs and discussed the procedure including the risks, benefits and alternatives for the proposed anesthesia with the patient or authorized representative who has indicated his/her understanding and acceptance.   Dental advisory given  Plan Discussed with: CRNA and Anesthesiologist  Anesthesia Plan Comments: (Discussed risks/benefits/alternatives to MAC sedation including need for ventilatory support, hypotension, need for conversion to general anesthesia.  All patient questions answered.  Patient/guardian wishes to proceed.)        Anesthesia Quick Evaluation

## 2016-11-03 NOTE — Transfer of Care (Signed)
Immediate Anesthesia Transfer of Care Note  Patient: [name]  Procedure(s) Performed: Procedure(s): COLONOSCOPY (Left)  Patient Location: Endoscopy Unit  Anesthesia Type:MAC  Level of Consciousness: awake, oriented and sedated  Airway & Oxygen Therapy: Patient Spontanous Breathing and Patient connected to face mask oxygen  Post-op Assessment: Report given to RN, Post -op Vital signs reviewed and stable and Patient moving all extremities X 4  Post vital signs: Reviewed and stable  Last Vitals:  Vitals:   11/03/16 0915 11/03/16 1209  BP: (!) 148/68 (!) 176/57  Pulse: 89 75  Resp:  20  Temp:  36.6 C    Last Pain:  Vitals:   11/03/16 1209  TempSrc: Oral  PainSc:          Complications: No apparent anesthesia complications

## 2016-11-03 NOTE — H&P (View-Only) (Signed)
Subjective: Patient admitted for melenic stools with anemia. For EGD today. Objective: Vital signs in last 24 hours: Temp:  [97.6 F (36.4 C)-98.7 F (37.1 C)] 97.6 F (36.4 C) (03/21 1320) Pulse Rate:  [65-80] 80 (03/21 1320) Resp:  [16-24] 24 (03/21 1320) BP: (130-187)/(46-62) 187/62 (03/21 1320) SpO2:  [97 %-99 %] 99 % (03/21 1320) Weight:  [47.1 kg (103 lb 13.4 oz)] 47.1 kg (103 lb 13.4 oz) (03/21 1320) Last BM Date: 11/01/16  Intake/Output from previous day: 03/20 0701 - 03/21 0700 In: 1386 [P.O.:720; Blood:666] Out: 1400 [Urine:1400] Intake/Output this shift: Total I/O In: 930 [P.O.:930] Out: -   General appearance: alert, cooperative, appears stated age, no distress and pale Resp: clear to auscultation bilaterally Cardio: systolic murmur: 2/6 heard GI: soft, non-tender; bowel sounds normal; no masses,  no organomegaly  Lab Results:  Recent Labs  10/31/16 2224 11/01/16 0632 11/01/16 1757 11/02/16 0656  WBC 11.5* 7.6  --  5.6  HGB 7.9* 7.0* 10.1* 10.2*  HCT 23.7* 20.7* 29.5* 30.9*  PLT 137* 113*  --  97*   BMET  Recent Labs  10/31/16 1159 11/01/16 0632 11/02/16 0656  NA 134* 137 140  K 5.3* 4.6 4.4  CL 108 112* 116*  CO2 13* 14* 15*  GLUCOSE 159* 99 110*  BUN 87* 82* 67*  CREATININE 4.64* 4.35* 4.01*  CALCIUM 8.4* 8.2* 8.2*   LFT  Recent Labs  10/31/16 1159  PROT 5.2*  ALBUMIN 3.2*  AST 14*  ALT 11*  ALKPHOS 31*  BILITOT 0.2*    Medications: I have reviewed the patient's current medications.  Assessment/Plan: Melena and anemia-proceed with an EGD.  LOS: 2 days   Neilani Duffee 11/02/2016, 1:50 PM

## 2016-11-03 NOTE — Progress Notes (Signed)
Taneia T Virgin to be D/C'd to home per MD order.  Discussed with the patient and all questions fully answered.  VSS, Skin clean, dry and intact without evidence of skin break down, no evidence of skin tears noted. IV catheter discontinued intact. Site without signs and symptoms of complications. Dressing and pressure applied.  An After Visit Summary was printed and given to the patient. Patient received prescription.  D/c education completed with patient/family including follow up instructions, medication list, d/c activities limitations if indicated, with other d/c instructions as indicated by MD - patient able to verbalize understanding, all questions fully answered.   Patient instructed to return to ED, call 911, or call MD for any changes in condition.   Patient escorted via [place], and D/C home via private auto.  Lynann Beaver 11/03/2016 6:41 PM

## 2016-11-03 NOTE — Interval H&P Note (Signed)
History and Physical Interval Note:  11/03/2016 1:16 PM  [name]  has presented today for surgery, with the diagnosis of Blood in stool with anemia  The various methods of treatment have been discussed with the patient and family. After consideration of risks, benefits and other options for treatment, the patient has consented to  Procedure(s): COLONOSCOPY (Left) as a surgical intervention .  The patient's history has been reviewed, patient examined, no change in status, stable for surgery.  I have reviewed the patient's chart and labs.  Questions were answered to the patient's satisfaction.     Kirsten Mckone D

## 2016-11-03 NOTE — Discharge Summary (Signed)
Physician Discharge Summary  [name] QPY:[id number] DOB: 75/09/1940 DOA: 10/31/2016  PCP: [name], MD  Admit date: 10/31/2016 Discharge date: 11/03/2016  Admitted From: Home Disposition: Home   Recommendations for Outpatient Follow-up:  1. Follow up with PCP in 1-2 weeks. Had spontaneously resolving GI bleeding, and ASA was held until follow up.  2. Started PPI. 3. Follow up with Dr. Collene Mares in 2 - 4 weeks; follow up biopsy from colonoscopy 3/22.  4. 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 She had been started on ASA after CVA last month and had received 2u PRBCs 4 days prior to admission. Last colonoscopy in 2011 by Dr. Collene Mares showed scattered diverticula. GI was consulted and performed an EGD which showed no source of bleeding. Subsequent colonoscopy showed a few small ulcers/erosions in the sigmoid and ascending colon which were biopsied as well as diverticulosis of sigmoid and hepatic flexure. Because there was no further bleeding with advanced diet and no active bleeding on endoscopy, she will be discharged in stable condition with directions to temporarily hold aspirin until follow up.  Discharge Diagnoses:  Active Problems:   Anemia, chronic renal failure   Anxiety state   Essential hypertension   Coronary atherosclerosis   Chronic kidney disease (CKD), stage IV (severe) (HCC)   GI bleed   Blood loss anemia   AKI (acute kidney injury) (HCC)  Symptomatic anemia: Likely secondary to blood loss from GI source superimposed on  chronic anemia (baseline hgb ~10) of CKD.  - Hgb 5 > 7 > 10 with 4 total units PRBCs. No evidence of overload with this.  - Continue aranesp  GI bleed: Melena x1 week and +FOBT on admission. EGD 3/21 by Dr. Collene Mares negative. Bleeding not ongoing. Subsequent colonoscopy w/diverticulosis and a few small ulcers/erosions in the sigmoid and ascending colon. - Follow up biopsies - Continue holding NSAIDs (for GI and renal indications) and ASA - No rebleeding with bowel prep or advancement of diet - Continue PPI  Acute on chronic kidney disease. Cr4.64. Baseline 3.6. BUN 87 at admission, GI bleed contributing, without uremic symptoms. Making urine. Followed by [facility]. Has AVF in LUE.  - Continued sodium bicarbonate, patiromer.  - No evidence of uremia. DC creatinine was 3.62, felt to be euvolemic.   HTN: - Continue coreg, hold norvasc with relative hypotension and GI bleeding. - Resume losartan and lasix when renal function returns to nml - Hydralazine PRN  Hyperkalemia: Resolved. 5.3 on admission, down to normal 3/20. - Continued patiromer  Chronic UTI:  - Continued trimethoprim suppression, no Sxs.  CAD s/p CABG: no CP, troponin elevated very mildly more consistent with demand ischemia in setting of hypovolemia and anemia. ECG similar to priors without ST segment depressions/elevations. - Troponin continued flat/down-trending without ECG changes or chest pain/dyspnea. - Continued crestor - Transfused as above  Chronic HFpEF: G2DD on last echocardiogram. Euvolemic after 4u PRBCs. DC weight 128lbs  Anxiety: - Continued xanax  Discharge Instructions Discharge Instructions    Discharge instructions    Complete by:  As directed    You were  admitted with GI bleeding, possibly due to diverticula which was seen in colonoscopy. Your hemoglobin has remained stable and there was no active bleeding noted during the work up, so you are stable for discharge to home with the  following recommendations:  - START taking pantoprazole daily - Follow up with Dr. Collene Mares in 2 - 4 weeks, and ask about restarting aspirin at that time.  - You will be contacted by the GI office regarding your biopsy results.  - If you notice repeat of bleeding, abdominal pain, nausea, vomiting, or fever, please seek medical attention right away.     Allergies as of 11/03/2016      Reactions   Lipitor [atorvastatin] Other (See Comments)   Stomach pain   Strawberry Extract Rash      Medication List    STOP taking these medications   aspirin 325 MG tablet     TAKE these medications   ALPRAZolam 0.5 MG tablet Commonly known as:  XANAX Take 0.5 mg by mouth at bedtime.   amLODipine 5 MG tablet Commonly known as:  NORVASC Take 1 tablet (5 mg total) by mouth daily.   calcium acetate 667 MG capsule Commonly known as:  PHOSLO Take 1,334 mg by mouth 3 (three) times daily with meals. As directed   carvedilol 12.5 MG tablet Commonly known as:  COREG TAKE 1 TABLET(12.5 MG) BY MOUTH TWICE DAILY   cholecalciferol 1000 units tablet Commonly known as:  VITAMIN D Take 1,000 Units by mouth daily.   Darbepoetin Alfa 300 MCG/0.6ML Sosy injection Commonly known as:  ARANESP Inject 300 mcg into the skin once.   furosemide 20 MG tablet Commonly known as:  LASIX Take 20 mg by mouth daily. As directed   losartan 25 MG tablet Commonly known as:  COZAAR Take 1 tablet (25 mg total) by mouth daily.   pantoprazole 20 MG tablet Commonly known as:  PROTONIX Take 1 tablet (20 mg total) by mouth daily.   rosuvastatin 5 MG tablet Commonly known as:  CRESTOR TAKE 1 TABLET(5 MG) BY MOUTH DAILY   sodium bicarbonate 650 MG tablet Take 650 mg by mouth 2 (two) times daily.   trimethoprim 100 MG tablet Commonly known as:  TRIMPEX Take 50 mg by mouth daily.   VELTASSA 8.4 g packet Generic drug:  patiromer Take 8.4 g by mouth daily after lunch.      Follow-up Information    MANN,JYOTHI, MD  Follow up in 3 week(s).   Specialty:  Gastroenterology Contact information: [address] [place] [address] [phone]        [name], MD. Schedule an appointment as soon as possible for a visit in 1 week(s).   Specialty:  Family Medicine Contact information: [address] [place] [address] [phone]          Allergies  Allergen Reactions  . Lipitor [Atorvastatin] Other (See Comments)    Stomach pain  . Strawberry Extract Rash    Consultations:  GI, Dr. Collene Mares  Procedures/Studies:  EGD 3/21: - Normal appearing esophagus and GEJ. - Normal stomach. - No specimens collected.  Colonoscopy 3/22:  - A few small ulcers/erosions in the sigmoid and ascending colon. Biopsied. - Diverticulosis in the sigmoid colon and at the hepatic flexure.  Subjective: Pt feels well, no longer weak. Ambulating, eating. Wants to go home. No abd pain, N/V or further bleeding since admission.   Discharge Exam: Vitals:   11/03/16 1728 11/03/16 1730  BP: [name])  172/67 (!) 167/51  Pulse: 80 76  Resp:    Temp:     General: Pt is alert, awake, not in acute distress Cardiovascular: RRR, S1/S2 +, no rubs, no gallops Respiratory: CTA bilaterally, no wheezing, no rhonchi Abdominal: Soft, NT, ND, bowel sounds + Extremities: No edema, no cyanosis  The results of significant diagnostics from this hospitalization (including imaging, microbiology, ancillary and laboratory) are listed below for reference.    Labs: Basic Metabolic Panel:  Recent Labs Lab 10/31/16 1159 11/01/16 0632 11/02/16 0656 11/03/16 0332  NA 134* 137 140 137  K 5.3* 4.6 4.4 4.1  CL 108 112* 116* 110  CO2 13* 14* 15* 17*  GLUCOSE 159* 99 110* 112*  BUN 87* 82* 67* 50*  CREATININE 4.64* 4.35* 4.01* 3.62*  CALCIUM 8.4* 8.2* 8.2* 8.1*   Liver Function Tests:  Recent Labs Lab 10/31/16 1159  AST 14*  ALT 11*  ALKPHOS 31*  BILITOT 0.2*  PROT 5.2*  ALBUMIN 3.2*    CBC:  Recent Labs Lab 10/31/16 1159 10/31/16 2224 11/01/16 0632 11/01/16 1757 11/02/16 0656 11/03/16 0332  WBC 11.2* 11.5* 7.6  --  5.6 6.1  HGB 5.0* 7.9* 7.0* 10.1* 10.2* 11.1*  HCT 15.1* 23.7* 20.7* 29.5* 30.9* 33.7*  MCV 98.1 92.2 92.4  --  92.0 93.1  PLT 180 137* 113*  --  97* 107*   Cardiac Enzymes:  Recent Labs Lab 10/31/16 2224 11/01/16 0632 11/02/16 0656 11/03/16 0332  TROPONINI 0.08* 0.15* 0.17* 0.15*   Urinalysis    Component Value Date/Time   COLORURINE YELLOW 09/26/2016 1744   APPEARANCEUR TURBID (A) 09/26/2016 1744   LABSPEC 1.005 09/26/2016 1744   PHURINE 5.0 09/26/2016 1744   GLUCOSEU NEGATIVE 09/26/2016 1744   HGBUR SMALL (A) 09/26/2016 1744   BILIRUBINUR NEGATIVE 09/26/2016 1744   KETONESUR NEGATIVE 09/26/2016 1744   PROTEINUR 100 (A) 09/26/2016 1744   UROBILINOGEN 0.2 10/14/2008 0612   NITRITE NEGATIVE 09/26/2016 1744   LEUKOCYTESUR LARGE (A) 09/26/2016 1744   Time coordinating discharge: Approximately 40 minutes  Vance Gather, MD  [facility] 11/03/2016, 5:31 PM Pager [phone]

## 2016-11-03 NOTE — Op Note (Signed)
[facility] Patient Name: [name] Procedure Date : 11/03/2016 MRN: [id number] Attending MD: [name] , MD Date of Birth: 12-Aug-1941 CSN: [id number] Age: 76 Admit Type: Inpatient Procedure:                Colonoscopy Indications:              Hematochezia, Melena Providers:                [name], MD, Dortha Schwalbe RN, RN, Alfonso Patten, Technician, Phill Myron. Proofreader, CRNA Referring MD:              Medicines:                Propofol per Anesthesia Complications:            No immediate complications. Estimated Blood Loss:     Estimated blood loss: none. Procedure:                Pre-Anesthesia Assessment:                           - Prior to the procedure, a History and Physical                            was performed, and patient medications and                            allergies were reviewed. The patient's tolerance of                            previous anesthesia was also reviewed. The risks                            and benefits of the procedure and the sedation                            options and risks were discussed with the patient.                            All questions were answered, and informed consent                            was obtained. Prior Anticoagulants: The patient has                            taken no previous anticoagulant or antiplatelet                            agents. ASA Grade Assessment: III - A patient with                            severe systemic disease. After reviewing the risks  and benefits, the patient was deemed in                            satisfactory condition to undergo the procedure.                           - Sedation was administered by an anesthesia                            professional. Deep sedation was attained.                           After obtaining informed consent, the colonoscope                            was passed under  direct vision. Throughout the                            procedure, the patient's blood pressure, pulse, and                            oxygen saturations were monitored continuously. The                            EC-[id number] ([id number]) scope was introduced through                            the anus and advanced to the the cecum, identified                            by appendiceal orifice and ileocecal valve. The                            colonoscopy was somewhat difficult due to                            significant looping and a tortuous colon.                            Successful completion of the procedure was aided by                            using manual pressure and straightening and                            shortening the scope to obtain bowel loop                            reduction. The patient tolerated the procedure                            well. The quality of the bowel preparation was  good. The ileocecal valve, appendiceal orifice, and                            rectum were photographed. Scope In: 1:24:02 PM Scope Out: 1:53:32 PM Scope Withdrawal Time: 0 hours 13 minutes 13 seconds  Total Procedure Duration: 0 hours 29 minutes 30 seconds  Findings:      A few four mm ulcers were found in the sigmoid colon. No bleeding was       present. Biopsies were taken with a cold forceps for histology.      A few medium-mouthed diverticula were found in the sigmoid colon and       hepatic flexure.      Multiple blood laden stool balls were identified. In the descending       colon and sigmoid colon the mucosa was pale and the significance is       unknown. A few small ulcerations were identified and biopsied. In the       ascending colon a small erosions was found. I am uncertain if the       patient bled from a slower diverticular bleed and/or bled from some of       the erosions. Impression:               - A few small ulcers/erosions in the  sigmoid and                            ascending colon. Biopsied.                           - Diverticulosis in the sigmoid colon and at the                            hepatic flexure. Moderate Sedation:      None Recommendation:           - Return patient to hospital ward for ongoing care.                           - - Follow HGB and transfuse if necessary.                           - Patient can follow up with Dr. Collene Mares in 2-4 weeks.                           - Resume regular diet [Duration].                           - Continue present medications.                           - Patient has a contact number available for                            emergencies. The signs and symptoms of potential                            delayed complications were discussed with the  patient. Return to normal activities tomorrow.                            Written discharge instructions were provided to the                            patient.                           - If the pathology report is benign, then repeat                            colonoscopy for surveillance in 10 years. Procedure Code(s):        --- Professional ---                           [phone], Colonoscopy, flexible; with biopsy, single                            or multiple Diagnosis Code(s):        --- Professional ---                           K63.3, Ulcer of intestine                           K92.1, Melena (includes Hematochezia)                           K57.30, Diverticulosis of large intestine without                            perforation or abscess without bleeding CPT copyright 2016 [employer]. All rights reserved. The codes documented in this report are preliminary and upon coder review may  be revised to meet current compliance requirements. [name], MD [name], MD 11/03/2016 2:03:36 PM This report has been signed electronically. Number of Addenda: 0

## 2016-11-04 ENCOUNTER — Encounter (HOSPITAL_COMMUNITY): Payer: Self-pay | Admitting: Gastroenterology

## 2016-11-04 NOTE — Anesthesia Postprocedure Evaluation (Signed)
Anesthesia Post Note  Patient: [name]  Procedure(s) Performed: Procedure(s) (LRB): COLONOSCOPY (Left)  Patient location during evaluation: PACU Anesthesia Type: MAC Level of consciousness: awake and alert Pain management: pain level controlled Vital Signs Assessment: post-procedure vital signs reviewed and stable Respiratory status: spontaneous breathing, nonlabored ventilation, respiratory function stable and patient connected to nasal cannula oxygen Cardiovascular status: stable and blood pressure returned to baseline Anesthetic complications: no       Last Vitals:  Vitals:   11/03/16 1728 11/03/16 1730  BP: (!) 172/67 (!) 167/51  Pulse: 80 76  Resp:    Temp:      Last Pain:  Vitals:   11/03/16 1400  TempSrc: Oral  PainSc:                  Catalina Gravel

## 2016-11-07 NOTE — Progress Notes (Signed)
CARDIOLOGY OFFICE NOTE  Date:  11/08/2016    [name] Date of Birth: Sep 02, 1940 Medical Record #[id number]  PCP:  [name], MD  Cardiologist:  Annabell Howells    Chief Complaint  Patient presents with  . Coronary Artery Disease    Follow up visit - seen for Dr. Aundra Dubin    History of Present Illness: [name] is a 76 y.o. female who presents today for a 4 month check. Seen for Dr. Aundra Dubin.   She has a h/o CAD s/p CABG in 7672 and diastolic CHF. She has chronic anemia, balance issues, HTN, DM, HLD, CKD, chronic back pain, anxiety and known valvular heart disease.   I have seen her several times over the last few years.  She is followed by Nephrology in [place] - has seen Dr. Kellie Simmering for AV fistula consult. Kidneys are failing and she is nearing dialysis.   Seen back in July of 2017 - feeling ok. EKG with more changes. Updated her Myoview - see below. Brought back for discussion - opted to avoid cardiac catheterization and continue with medical management. Last seen in November and she was doing ok and seemed to be at her baseline.   Comes back today. Here with her husband. She has been in the hospital a few times since I last saw. Has had a mild stroke last month - noted to have right thalamic small infarct due to small vessel disease - carotids with 40 to 59% stenosis, Echo with normal EF, had her dose of aspirin increased to 325 mg. Then she had some major spontaneously resolving GI bleeding and has had colonoscopy. Small erosions noted. Negative EGD. She required transfusion with total of 6 units of blood. Holding aspirin now until seen back by GI. She notes that she is feeling better and feels stronger. No further bleeding. Has lost a significant amount of weight. No chest pain. Breathing is stable. Her recent labs noted.   Past Medical History:  1. CAD: Pt presented 2/10 to [facility] with NSTEMI and diastolic CHF exacerbation. LHC was done 3/10 showing 99%  pRCA stenosis and 80% calcified pLAD stenosis with L=>R collaterals. Pt was referred for CABG which was done by Dr. Prescott Gum with LIMA-LAD, SVG-RCA, SVG-OM.  2. Diastolic CHF: Echo (0/94) showed EF 55-65%, mild LVH, diastolic dysfunction, mild AS with mean gradient 12 mmHg, PASP 43 mmHg. Echo (2/12): EF 55-60%, mild LVH, mild AS (mean gradient 12), PA systolic pressure 32 mmHg. Echo (5/15) with EF 55%, mild MR, mild aortic stenosis.  3. CKD 4. DM2  5. HTN  6. Hyperlipidemia: nausea/GI discomfort with atorvastatin, ? Balance problems with pravastatin.  7. Anxiety  8. Chronic low back pain  9. History of thrombocytopenia  10. 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 Followed by hematology, getting Aranesp injections 11. Aortic stenosis: mean gradient 12 mmHg in 2/12. Mean gradient 18 mmHg with AVA 1.6 cm^2 in 5/15.  12. Carotid stenosis: 40-59% bilateral ICA stenosis in 7/09. 62-83% LICA stenosis in 6/62. Carotids (9/47) with 65-46% LICA stenosis.  13. PAD: ABIs 3/12 were normal but TBIs were mildly decreased. ABIs/TBIs (10/13) normal.    Past Medical History:  Diagnosis Date  . Anemia    Pt is taking iron.   [name] Anxiety   . Arthritis   . Carotid stenosis    40-59% bilateral ICA stenosis in 2/12.  . Chronic low back pain   . CKD (chronic kidney disease)  Dr. Audie Clear at [facility]  . Coronary artery disease    Pt presented 2/10 to [facility] with NSTEMI and diastolic CHF exacerbation.  LHC was done  3/10 showing 99% pRCA stenosis and 80% calcified pLAD stenosis with L=>R collaterals.  Pt was referred  for CABG which was done by Dr. Prescott Gum with LIMA-LAD, SVG-RCA, SVG-OM.  . Diabetes mellitus   . Diabetic neuropathy ([place])   . Diastolic CHF (H[facility])    Echo (2/10) showed EF 55-65%, mild LVH, diastolic dysfunction, mild AS with mean gradient 12 mmHg, PASP 43 mmHg.  Echo (2/12): EF 55-60%, mild LVH, mild AS (mean gradient 12), PA systolic pressure 32 mmHg.      [name] GERD (gastroesophageal reflux disease)   . Heart murmur   . Hyperlipidemia   . Hypertension   . Mild aortic stenosis    mean gradient 12 mmHg in 2/12.  . Myocardial infarction    "mild"  . Pneumonia   . PONV (postoperative nausea and vomiting)   . Thrombocytopenia ([place])   . Unsteady gait     Past Surgical History:  Procedure Laterality Date  . AV FISTULA PLACEMENT Left 01/30/2015   Procedure: Creation of a Radial Cephalic AV Fistula left wrist;  Surgeon: [name], MD;  Location: [place];  Service: Vascular;  Laterality: Left;  . BACK SURGERY     multiple  . BREAST SURGERY     biopsy  . CARDIAC CATHETERIZATION    . CATARACT EXTRACTION W/ INTRAOCULAR LENS  IMPLANT, BILATERAL    . COLONOSCOPY Left 11/03/2016   Procedure: COLONOSCOPY;  Surgeon: [name], MD;  Location: [facility] ENDOSCOPY;  Service: Endoscopy;  Laterality: Left;  . COLONOSCOPY W/ BIOPSIES AND POLYPECTOMY    . CORONARY ARTERY BYPASS GRAFT  09/2008   pt with NSTEMI and diastolic CHF exacerbation.  LHC was done  3/10 showing 99% pRCA stenosis and 80% calcified pLAD stenosis with L=>R collaterals.  Pt was referred  for CABG which was done by Dr. Prescott Gum with LIMA-LAD, SVG-RCA, SVG-OM.  [name] ESOPHAGOGASTRODUODENOSCOPY N/A 11/02/2016   Procedure: ESOPHAGOGASTRODUODENOSCOPY (EGD);  Surgeon: [name], MD;  Location: [facility] ENDOSCOPY;  Service: Endoscopy;  Laterality: N/A;  . REVISON OF ARTERIOVENOUS FISTULA Left 07/02/2015   Procedure: REVISON OF LEFT RADIOCEPHALIC ARTERIOVENOUS FISTULA;  Surgeon: [name], MD;  Location: [place];  Service: Vascular;  Laterality: Left;     Medications: Current Outpatient Prescriptions  Medication Sig Dispense Refill  . ALPRAZolam (XANAX) 0.5 MG tablet Take 0.5 mg by mouth at bedtime.     [name] amLODipine (NORVASC) 5 MG tablet Take 1 tablet (5 mg total) by mouth daily. 90 tablet 3  . calcium acetate (PHOSLO) 667 MG capsule Take 1,334 mg by mouth 3 (three) times daily with meals. As  directed    . carvedilol (COREG) 12.5 MG tablet TAKE 1 TABLET(12.5 MG) BY MOUTH TWICE DAILY 60 tablet 9  . cholecalciferol (VITAMIN D) 1000 UNITS tablet Take 1,000 Units by mouth daily.    . Darbepoetin Alfa (ARANESP) 300 MCG/0.6ML SOSY injection Inject 300 mcg into the skin once.    . furosemide (LASIX) 20 MG tablet Take 20 mg by mouth daily. As directed    . losartan (COZAAR) 25 MG tablet Take 1 tablet (25 mg total) by mouth daily. 90 tablet 3  . pantoprazole (PROTONIX) 20 MG tablet Take 1 tablet (20 mg total) by mouth daily. 30 tablet 0  . rosuvastatin (CRESTOR) 5 MG tablet TAKE 1 TABLET(5 MG)  BY MOUTH DAILY 90 tablet 3  . sodium bicarbonate 650 MG tablet Take 650 mg by mouth 2 (two) times daily.    [name] trimethoprim (TRIMPEX) 100 MG tablet Take 50 mg by mouth daily.     . VELTASSA 8.4 G packet Take 8.4 g by mouth daily after lunch.      No current facility-administered medications for this visit.     Allergies: Allergies  Allergen Reactions  . Lipitor [Atorvastatin] Other (See Comments)    Stomach pain  . Strawberry Extract Rash    Social History: The patient  reports that she has quit smoking. Her smoking use included Cigarettes. She has never used smokeless tobacco. She reports that she drinks alcohol. She reports that she does not use drugs.   Family History: The patient's She was adopted. Family history is unknown by patient.   Review of Systems: Please see the history of present illness.   Otherwise, the review of systems is positive for none.   All other systems are reviewed and negative.   Physical Exam: VS:  BP 130/68   Pulse 69   Ht 5\' 8"  (1.727 m)   Wt 127 lb 12.8 oz (58 kg)   SpO2 98% Comment: at rest  BMI 19.43 kg/m  .  BMI Body mass index is 19.43 kg/m.  Wt Readings from Last 3 Encounters:  11/08/16 127 lb 12.8 oz (58 kg)  11/03/16 128 lb 15.5 oz (58.5 kg)  10/05/16 133 lb (60.3 kg)    General: Pleasant. Quite thin. Chronically ill appearing. She is alert  and in no acute distress.   HEENT: Normal.  Neck: Supple, no JVD, carotid bruits, or masses noted.  Cardiac: Regular rate and rhythm. Outflow murmur noted. No edema.  Respiratory:  Lungs are clear to auscultation bilaterally with normal work of breathing.  GI: Soft and nontender.  MS: No deformity or atrophy. Gait and ROM intact.  Skin: Warm and dry. Color is normal.  Neuro:  Strength and sensation are intact and no gross focal deficits noted.  Psych: Alert, appropriate and with normal affect.   LABORATORY DATA:  EKG:  EKG is not ordered today.  Lab Results  Component Value Date   WBC 6.1 11/03/2016   HGB 11.1 (L) 11/03/2016   HCT 33.7 (L) 11/03/2016   PLT 107 (L) 11/03/2016   GLUCOSE 112 (H) 11/03/2016   CHOL 99 09/27/2016   TRIG 84 09/27/2016   HDL 51 09/27/2016   LDLDIRECT 34.4 11/17/2008   LDLCALC 31 09/27/2016   ALT 11 (L) 10/31/2016   AST 14 (L) 10/31/2016   NA 137 11/03/2016   K 4.1 11/03/2016   CL 110 11/03/2016   CREATININE 3.62 (H) 11/03/2016   BUN 50 (H) 11/03/2016   CO2 17 (L) 11/03/2016   TSH 2.185 01/17/2014   INR 1.4 10/16/2008   HGBA1C 5.4 09/27/2016    BNP (last 3 results) No results for input(s): BNP in the last 8760 hours.  ProBNP (last 3 results) No results for input(s): PROBNP in the last 8760 hours.   Other Studies Reviewed Today:  Myoview Study Highlights 02/2016    Nuclear stress EF: 54%. There is mid inferior wall hypokinesis  There was no ST segment deviation noted during stress.  Defect 1: There is a medium defect of moderate severity present in the basal inferior and mid inferior location.  The mid inferior wall perfusion defect is reversible consistent with ischemia. The basal inferior wall defect is mostly fixed consistent with  infarct.  This is an intermediate risk study. Consider occlusion of SVG to RCA graft.  Candee Furbish, MD   Echo Study Conclusions from 09/2016  - Left ventricle: The cavity size was normal.  There was moderate   focal basal and mild concentric hypertrophy. Systolic function   was normal. The estimated ejection fraction was in the range of   55% to 60%. Wall motion was normal; there were no regional wall   motion abnormalities. Features are consistent with a pseudonormal   left ventricular filling pattern, with concomitant abnormal   relaxation and increased filling pressure (grade 2 diastolic   dysfunction). - Aortic valve: The AV appears mildly to moderately stenotic by   doppler with a mean AV gradient of 30mmHg. The calculated AVA is   0.9cm2 and likely underestimates with AVA due to inaccurate   measurment of the LVOT. Visually there appears to be mild to   moderate AS. Valve area (VTI): 0.9 cm^2. Valve area (Vmax): 0.89   cm^2. Valve area (Vmean): 0.86 cm^2. - Mitral valve: There was trivial regurgitation. - Right ventricle: Systolic function was mildly reduced. - Pulmonic valve: There was mild regurgitation. - Pulmonary arteries: PA peak pressure: 33 mm Hg (S).   Carotid Doppler Summary: Findings consistent with a high end 1- 39 percent stenosis involving the right internal carotid artery and the left internal carotid artery. Can not rule out higher grade stenosis due to severe amount of shadowing plaque in bilateral carotid arteries and difficulty visualizing vessels. Severe amount of plaque are subjectively suggestive of a 40-59% percent stenosis bilaterally. The right vertebral demonstrates atypical flow, left vertebral is patent and antegrade.  Other specific details can be found in the table(s) above. Prepared and Electronically Authenticated by  Antony Contras MD 2018-02-14T09:22:41   Assessment/Plan:   1. CAD - Status post CABG from 2010. Has had prior intermediate Myoview - reviewed previously with Dr. Aundra Dubin. No active chest pain. Will continue with her current medical regimen for now. At some point she will be on dialysis and then we may need to  proceed with left and right heart cath. Unfortunately, she seems to have a general decline and overall prognosis looks quite tenuous to me - especially with all these recent issues.   2. AORTIC STENOSIS  No symptoms - recent echo noted - no cardinal symptoms.   3. Recent stroke - tried to be on higher dose aspirin - this led to major GI bleeding.   4. HYPERTENSION - BP ok on current regimen. No change with her current regimen.   5. Hyperlipidemia - on statin therapy - refilled Crestor today. Lipids from last month reviewed.   6. Progressive CKD - followed by Ren[facility] in [place]       7. Symptomatic anemia:  Likely secondary to blood loss from GI source superimposed on chronic anemia (baseline hgb ~10) of CKD.        8. Recent GI bleed:  +FOBT on admission. EGD 3/21 by Dr. Collene Mares negative.  Subsequent colonoscopy w/diverticulosis and a few small ulcers/erosions in the sigmoid and ascending colon. She is now off of aspirin. Seeing GI in just a few weeks.    Current medicines are reviewed with the patient today.  The patient does not have concerns regarding medicines other than what has been noted above.  The following changes have been made:  See above.  Labs/ tests ordered today include:   No orders of the defined types were placed in this encounter.  Disposition:   FU with me in 3 months.  Overall prognosis tenuous.   Patient is agreeable to this plan and will call if any problems develop in the interim.   SignedTruitt Merle, NP  11/08/2016 9:43 AM  [facility] [address] [place], [place]  [address] Phone: [phone] Fax: [phone]

## 2016-11-08 ENCOUNTER — Encounter: Payer: Self-pay | Admitting: Nurse Practitioner

## 2016-11-08 ENCOUNTER — Ambulatory Visit (INDEPENDENT_AMBULATORY_CARE_PROVIDER_SITE_OTHER): Payer: Medicare HMO | Admitting: Nurse Practitioner

## 2016-11-08 VITALS — BP 130/68 | HR 69 | Ht 68.0 in | Wt 127.8 lb

## 2016-11-08 DIAGNOSIS — I10 Essential (primary) hypertension: Secondary | ICD-10-CM

## 2016-11-08 DIAGNOSIS — I259 Chronic ischemic heart disease, unspecified: Secondary | ICD-10-CM

## 2016-11-08 DIAGNOSIS — N186 End stage renal disease: Secondary | ICD-10-CM

## 2016-11-08 DIAGNOSIS — E7800 Pure hypercholesterolemia, unspecified: Secondary | ICD-10-CM

## 2016-11-08 MED ORDER — ROSUVASTATIN CALCIUM 5 MG PO TABS: ORAL_TABLET | ORAL | 3 refills | Status: DC

## 2016-11-08 NOTE — Patient Instructions (Signed)
We will be checking the following labs today - NONE   Medication Instructions:    Continue with your current medicines.     Testing/Procedures To Be Arranged:  N/A  Follow-Up:   See me in 3 months    Other Special Instructions:   N/A    If you need a refill on your cardiac medications before your next appointment, please call your pharmacy.   Call the [facility] office at [phone] if you have any questions, problems or concerns.      

## 2016-11-10 DIAGNOSIS — K625 Hemorrhage of anus and rectum: Secondary | ICD-10-CM | POA: Diagnosis not present

## 2016-11-10 DIAGNOSIS — R69 Illness, unspecified: Secondary | ICD-10-CM | POA: Diagnosis not present

## 2016-11-10 DIAGNOSIS — N185 Chronic kidney disease, stage 5: Secondary | ICD-10-CM | POA: Diagnosis not present

## 2016-11-10 DIAGNOSIS — D62 Acute posthemorrhagic anemia: Secondary | ICD-10-CM | POA: Diagnosis not present

## 2016-11-10 DIAGNOSIS — I120 Hypertensive chronic kidney disease with stage 5 chronic kidney disease or end stage renal disease: Secondary | ICD-10-CM | POA: Diagnosis not present

## 2016-11-10 DIAGNOSIS — Z09 Encounter for follow-up examination after completed treatment for conditions other than malignant neoplasm: Secondary | ICD-10-CM | POA: Diagnosis not present

## 2016-11-19 DIAGNOSIS — M25431 Effusion, right wrist: Secondary | ICD-10-CM | POA: Diagnosis not present

## 2016-11-19 DIAGNOSIS — M25531 Pain in right wrist: Secondary | ICD-10-CM | POA: Diagnosis not present

## 2016-11-22 DIAGNOSIS — K5730 Diverticulosis of large intestine without perforation or abscess without bleeding: Secondary | ICD-10-CM | POA: Diagnosis not present

## 2016-11-22 DIAGNOSIS — D509 Iron deficiency anemia, unspecified: Secondary | ICD-10-CM | POA: Diagnosis not present

## 2016-11-23 ENCOUNTER — Other Ambulatory Visit (HOSPITAL_BASED_OUTPATIENT_CLINIC_OR_DEPARTMENT_OTHER): Payer: Medicare HMO

## 2016-11-23 ENCOUNTER — Ambulatory Visit: Payer: Medicare HMO

## 2016-11-23 DIAGNOSIS — D509 Iron deficiency anemia, unspecified: Secondary | ICD-10-CM

## 2016-11-23 LAB — CBC WITH DIFFERENTIAL/PLATELET
BASO%: 1.5 % (ref 0.0–2.0)
BASOS ABS: 0.1 10*3/uL (ref 0.0–0.1)
EOS%: 4 % (ref 0.0–7.0)
Eosinophils Absolute: 0.2 10*3/uL (ref 0.0–0.5)
HEMATOCRIT: 30 % — AB (ref 34.8–46.6)
HGB: 10.2 g/dL — ABNORMAL LOW (ref 11.6–15.9)
LYMPH%: 21.5 % (ref 14.0–49.7)
MCH: 31.4 pg (ref 25.1–34.0)
MCHC: 34 g/dL (ref 31.5–36.0)
MCV: 92.3 fL (ref 79.5–101.0)
MONO#: 0.6 10*3/uL (ref 0.1–0.9)
MONO%: 11.1 % (ref 0.0–14.0)
NEUT#: 3.3 10*3/uL (ref 1.5–6.5)
NEUT%: 61.9 % (ref 38.4–76.8)
PLATELETS: 206 10*3/uL (ref 145–400)
RBC: 3.25 10*6/uL — AB (ref 3.70–5.45)
RDW: 15.8 % — ABNORMAL HIGH (ref 11.2–14.5)
WBC: 5.2 10*3/uL (ref 3.9–10.3)
lymph#: 1.1 10*3/uL (ref 0.9–3.3)

## 2016-11-23 LAB — IRON AND TIBC
%SAT: 30 % (ref 21–57)
Iron: 61 ug/dL (ref 41–142)
TIBC: 208 ug/dL — AB (ref 236–444)
UIBC: 146 ug/dL (ref 120–384)

## 2016-11-23 LAB — FERRITIN: Ferritin: 171 ng/ml (ref 9–269)

## 2016-11-23 NOTE — Progress Notes (Signed)
Hemoglobin noted at 10.2, no injection given per Dr. Alen Blew order. Pt and family member given copy of labs and schedule. Instructed to follow schedule and to call office should issues occur. Pt and family member verbalized understanding of instructions.

## 2016-11-25 ENCOUNTER — Ambulatory Visit: Payer: Self-pay | Admitting: Nurse Practitioner

## 2016-11-29 ENCOUNTER — Ambulatory Visit (HOSPITAL_COMMUNITY)
Admission: RE | Admit: 2016-11-29 | Discharge: 2016-11-29 | Disposition: A | Discharge status: Home / Self Care | Payer: Medicare HMO | Source: Ambulatory Visit | Attending: Gastroenterology | Admitting: Gastroenterology

## 2016-11-29 ENCOUNTER — Ambulatory Visit: Payer: Self-pay | Admitting: Nurse Practitioner

## 2016-11-29 ENCOUNTER — Encounter (HOSPITAL_COMMUNITY)
Admission: RE | Disposition: A | Discharge status: Home / Self Care | Payer: Self-pay | Source: Ambulatory Visit | Attending: Gastroenterology

## 2016-11-29 DIAGNOSIS — D509 Iron deficiency anemia, unspecified: Secondary | ICD-10-CM | POA: Diagnosis not present

## 2016-11-29 HISTORY — PX: GIVENS CAPSULE STUDY: SHX5432

## 2016-11-29 SURGERY — IMAGING PROCEDURE, GI TRACT, INTRALUMINAL, VIA CAPSULE
Anesthesia: LOCAL

## 2016-11-29 SURGICAL SUPPLY — 1 items: TOWEL COTTON PACK 4EA (MISCELLANEOUS) ×4 IMPLANT

## 2016-11-29 NOTE — Progress Notes (Signed)
Patient ingested givens capsule for small bowel study 11/29/2016 at [place]. Patient able to swallow without difficulty. Written instructions given to patient and reviewed with her and husband. They express understanding of this. Patient husband will return equipment to [facility] 11/30/16 before 1100.

## 2016-11-30 ENCOUNTER — Encounter (HOSPITAL_COMMUNITY): Payer: Self-pay | Admitting: Gastroenterology

## 2016-12-02 DIAGNOSIS — N185 Chronic kidney disease, stage 5: Secondary | ICD-10-CM | POA: Diagnosis not present

## 2016-12-02 DIAGNOSIS — I120 Hypertensive chronic kidney disease with stage 5 chronic kidney disease or end stage renal disease: Secondary | ICD-10-CM | POA: Diagnosis not present

## 2016-12-02 DIAGNOSIS — E1122 Type 2 diabetes mellitus with diabetic chronic kidney disease: Secondary | ICD-10-CM | POA: Diagnosis not present

## 2016-12-06 DIAGNOSIS — N189 Chronic kidney disease, unspecified: Secondary | ICD-10-CM | POA: Diagnosis not present

## 2016-12-06 DIAGNOSIS — E871 Hypo-osmolality and hyponatremia: Secondary | ICD-10-CM | POA: Diagnosis not present

## 2016-12-06 DIAGNOSIS — E1122 Type 2 diabetes mellitus with diabetic chronic kidney disease: Secondary | ICD-10-CM | POA: Diagnosis not present

## 2016-12-06 DIAGNOSIS — D631 Anemia in chronic kidney disease: Secondary | ICD-10-CM | POA: Diagnosis not present

## 2016-12-06 DIAGNOSIS — N185 Chronic kidney disease, stage 5: Secondary | ICD-10-CM | POA: Diagnosis not present

## 2016-12-06 DIAGNOSIS — M899 Disorder of bone, unspecified: Secondary | ICD-10-CM | POA: Diagnosis not present

## 2016-12-06 DIAGNOSIS — E875 Hyperkalemia: Secondary | ICD-10-CM | POA: Diagnosis not present

## 2016-12-06 DIAGNOSIS — I120 Hypertensive chronic kidney disease with stage 5 chronic kidney disease or end stage renal disease: Secondary | ICD-10-CM | POA: Diagnosis not present

## 2016-12-14 ENCOUNTER — Other Ambulatory Visit (HOSPITAL_BASED_OUTPATIENT_CLINIC_OR_DEPARTMENT_OTHER): Payer: Medicare HMO

## 2016-12-14 ENCOUNTER — Ambulatory Visit (HOSPITAL_BASED_OUTPATIENT_CLINIC_OR_DEPARTMENT_OTHER): Payer: Medicare HMO

## 2016-12-14 VITALS — BP 116/48 | HR 55 | Temp 97.4°F | Resp 20

## 2016-12-14 DIAGNOSIS — D631 Anemia in chronic kidney disease: Secondary | ICD-10-CM

## 2016-12-14 DIAGNOSIS — N189 Chronic kidney disease, unspecified: Secondary | ICD-10-CM

## 2016-12-14 DIAGNOSIS — D696 Thrombocytopenia, unspecified: Secondary | ICD-10-CM

## 2016-12-14 DIAGNOSIS — D509 Iron deficiency anemia, unspecified: Secondary | ICD-10-CM

## 2016-12-14 DIAGNOSIS — N184 Chronic kidney disease, stage 4 (severe): Secondary | ICD-10-CM

## 2016-12-14 LAB — CBC WITH DIFFERENTIAL/PLATELET
BASO%: 0.4 % (ref 0.0–2.0)
Basophils Absolute: 0 10*3/uL (ref 0.0–0.1)
EOS%: 4.1 % (ref 0.0–7.0)
Eosinophils Absolute: 0.2 10*3/uL (ref 0.0–0.5)
HEMATOCRIT: 25.7 % — AB (ref 34.8–46.6)
HEMOGLOBIN: 8.5 g/dL — AB (ref 11.6–15.9)
LYMPH#: 1.6 10*3/uL (ref 0.9–3.3)
LYMPH%: 31.3 % (ref 14.0–49.7)
MCH: 30.7 pg (ref 25.1–34.0)
MCHC: 33.1 g/dL (ref 31.5–36.0)
MCV: 92.8 fL (ref 79.5–101.0)
MONO#: 0.7 10*3/uL (ref 0.1–0.9)
MONO%: 14.4 % — ABNORMAL HIGH (ref 0.0–14.0)
NEUT%: 49.8 % (ref 38.4–76.8)
NEUTROS ABS: 2.5 10*3/uL (ref 1.5–6.5)
NRBC: 0 % (ref 0–0)
Platelets: 103 10*3/uL — ABNORMAL LOW (ref 145–400)
RBC: 2.77 10*6/uL — ABNORMAL LOW (ref 3.70–5.45)
RDW: 15.6 % — AB (ref 11.2–14.5)
WBC: 5.1 10*3/uL (ref 3.9–10.3)

## 2016-12-14 MED ORDER — DARBEPOETIN ALFA 300 MCG/0.6ML IJ SOSY
300.0000 ug | PREFILLED_SYRINGE | Freq: Once | INTRAMUSCULAR | Status: AC
  Administered 2016-12-14: 300 ug via SUBCUTANEOUS
  Filled 2016-12-14: qty 0.6

## 2016-12-14 NOTE — Patient Instructions (Signed)
Darbepoetin Alfa injection What is this medicine? DARBEPOETIN ALFA (dar be POE e tin AL fa) helps your body make more red blood cells. It is used to treat anemia caused by chronic kidney failure and chemotherapy. This medicine may be used for other purposes; ask your health care provider or pharmacist if you have questions. COMMON BRAND NAME(S): Aranesp What should I tell my health care provider before I take this medicine? They need to know if you have any of these conditions: -blood clotting disorders or history of blood clots -cancer patient not on chemotherapy -cystic fibrosis -heart disease, such as angina, heart failure, or a history of a heart attack -hemoglobin level of 12 g/dL or greater -high blood pressure -low levels of folate, iron, or vitamin B12 -seizures -an unusual or allergic reaction to darbepoetin, erythropoietin, albumin, hamster proteins, latex, other medicines, foods, dyes, or preservatives -pregnant or trying to get pregnant -breast-feeding How should I use this medicine? This medicine is for injection into a vein or under the skin. It is usually given by a health care professional in a hospital or clinic setting. If you get this medicine at home, you will be taught how to prepare and give this medicine. Do not shake the solution before you withdraw a dose. Use exactly as directed. Take your medicine at regular intervals. Do not take your medicine more often than directed. It is important that you put your used needles and syringes in a special sharps container. Do not put them in a trash can. If you do not have a sharps container, call your pharmacist or healthcare provider to get one. Talk to your pediatrician regarding the use of this medicine in children. While this medicine may be used in children as young as 1 year for selected conditions, precautions do apply. Overdosage: If you think you have taken too much of this medicine contact a poison control center or  emergency room at once. NOTE: This medicine is only for you. Do not share this medicine with others. What if I miss a dose? If you miss a dose, take it as soon as you can. If it is almost time for your next dose, take only that dose. Do not take double or extra doses. What may interact with this medicine? Do not take this medicine with any of the following medications: -epoetin alfa This list may not describe all possible interactions. Give your health care provider a list of all the medicines, herbs, non-prescription drugs, or dietary supplements you use. Also tell them if you smoke, drink alcohol, or use illegal drugs. Some items may interact with your medicine. What should I watch for while using this medicine? Visit your prescriber or health care professional for regular checks on your progress and for the needed blood tests and blood pressure measurements. It is especially important for the doctor to make sure your hemoglobin level is in the desired range, to limit the risk of potential side effects and to give you the best benefit. Keep all appointments for any recommended tests. Check your blood pressure as directed. Ask your doctor what your blood pressure should be and when you should contact him or her. As your body makes more red blood cells, you may need to take iron, folic acid, or vitamin B supplements. Ask your doctor or health care provider which products are right for you. If you have kidney disease continue dietary restrictions, even though this medication can make you feel better. Talk with your doctor or health   care professional about the foods you eat and the vitamins that you take. What side effects may I notice from receiving this medicine? Side effects that you should report to your doctor or health care professional as soon as possible: -allergic reactions like skin rash, itching or hives, swelling of the face, lips, or tongue -breathing problems -changes in vision -chest  pain -confusion, trouble speaking or understanding -feeling faint or lightheaded, falls -high blood pressure -muscle aches or pains -pain, swelling, warmth in the leg -rapid weight gain -severe headaches -sudden numbness or weakness of the face, arm or leg -trouble walking, dizziness, loss of balance or coordination -seizures (convulsions) -swelling of the ankles, feet, hands -unusually weak or tired Side effects that usually do not require medical attention (report to your doctor or health care professional if they continue or are bothersome): -diarrhea -fever, chills (flu-like symptoms) -headaches -nausea, vomiting -redness, stinging, or swelling at site where injected This list may not describe all possible side effects. Call your doctor for medical advice about side effects. You may report side effects to FDA at 1-800-FDA-1088. Where should I keep my medicine? Keep out of the reach of children. Store in a refrigerator between 2 and 8 degrees C (36 and 46 degrees F). Do not freeze. Do not shake. Throw away any unused portion if using a single-dose vial. Throw away any unused medicine after the expiration date. NOTE: This sheet is a summary. It may not cover all possible information. If you have questions about this medicine, talk to your doctor, pharmacist, or health care provider.  2015, [employer]. (2008-07-15 10:23:57)  

## 2016-12-14 NOTE — Progress Notes (Signed)
Hemoglobin  Noted at 8.5 today. Pt denies chest pain, difficulty breathing, dizziness , or difficulty completing ADL'S. Injection given per order. Instructed patient and family member to call office or EMS at once for bleeding.

## 2016-12-20 DIAGNOSIS — D509 Iron deficiency anemia, unspecified: Secondary | ICD-10-CM | POA: Diagnosis not present

## 2017-01-04 ENCOUNTER — Other Ambulatory Visit (HOSPITAL_BASED_OUTPATIENT_CLINIC_OR_DEPARTMENT_OTHER): Payer: Medicare HMO

## 2017-01-04 ENCOUNTER — Ambulatory Visit (HOSPITAL_BASED_OUTPATIENT_CLINIC_OR_DEPARTMENT_OTHER): Payer: Medicare HMO | Admitting: Oncology

## 2017-01-04 ENCOUNTER — Telehealth: Payer: Self-pay | Admitting: Oncology

## 2017-01-04 ENCOUNTER — Ambulatory Visit (HOSPITAL_BASED_OUTPATIENT_CLINIC_OR_DEPARTMENT_OTHER): Payer: Medicare HMO

## 2017-01-04 VITALS — BP 137/58 | HR 61 | Temp 97.7°F | Resp 17 | Ht 68.0 in | Wt 128.1 lb

## 2017-01-04 DIAGNOSIS — N189 Chronic kidney disease, unspecified: Secondary | ICD-10-CM

## 2017-01-04 DIAGNOSIS — D696 Thrombocytopenia, unspecified: Secondary | ICD-10-CM | POA: Diagnosis not present

## 2017-01-04 DIAGNOSIS — D631 Anemia in chronic kidney disease: Secondary | ICD-10-CM

## 2017-01-04 DIAGNOSIS — D509 Iron deficiency anemia, unspecified: Secondary | ICD-10-CM

## 2017-01-04 DIAGNOSIS — D500 Iron deficiency anemia secondary to blood loss (chronic): Secondary | ICD-10-CM

## 2017-01-04 LAB — CBC WITH DIFFERENTIAL/PLATELET
BASO%: 0.9 % (ref 0.0–2.0)
Basophils Absolute: 0 10*3/uL (ref 0.0–0.1)
EOS%: 2.7 % (ref 0.0–7.0)
Eosinophils Absolute: 0.1 10*3/uL (ref 0.0–0.5)
HCT: 29 % — ABNORMAL LOW (ref 34.8–46.6)
HGB: 9.5 g/dL — ABNORMAL LOW (ref 11.6–15.9)
LYMPH%: 21.3 % (ref 14.0–49.7)
MCH: 32.3 pg (ref 25.1–34.0)
MCHC: 32.7 g/dL (ref 31.5–36.0)
MCV: 98.7 fL (ref 79.5–101.0)
MONO#: 0.7 10*3/uL (ref 0.1–0.9)
MONO%: 13.1 % (ref 0.0–14.0)
NEUT%: 62 % (ref 38.4–76.8)
NEUTROS ABS: 3.2 10*3/uL (ref 1.5–6.5)
PLATELETS: 89 10*3/uL — AB (ref 145–400)
RBC: 2.94 10*6/uL — AB (ref 3.70–5.45)
RDW: 18.4 % — ABNORMAL HIGH (ref 11.2–14.5)
WBC: 5.2 10*3/uL (ref 3.9–10.3)
lymph#: 1.1 10*3/uL (ref 0.9–3.3)

## 2017-01-04 MED ORDER — DARBEPOETIN ALFA 300 MCG/0.6ML IJ SOSY
300.0000 ug | PREFILLED_SYRINGE | Freq: Once | INTRAMUSCULAR | Status: AC
  Administered 2017-01-04: 300 ug via SUBCUTANEOUS
  Filled 2017-01-04: qty 0.6

## 2017-01-04 NOTE — Telephone Encounter (Signed)
Lab and Injections, scheduled every three weeks, per 01/04/17 los. Lab, follow up and Injection, scheduled for 05/10/17 per 01/04/17 los. Patient was given a copy of the AVS report and appointment schedule, per 01/04/17 los.

## 2017-01-04 NOTE — Progress Notes (Signed)
Hematology and Oncology Follow Up Visit  [name] [id number] 1941-01-21 76 y.o. 01/04/2017 10:03 AM [name], MDWilson, Jeanne Flavors, MD   Principle Diagnosis: 76 year old female with anemia of renal disease diagnosed in 2011. She has chronic renal insufficiency but has not required hemodialysis.  Current therapy: Aranesp 300 mcg subcutaneously every 3 weeks keeping hemoglobin above 11.   Interim History:  [name] presents today for a followup visit with her husband. Since the last visit, she was hospitalized and not March 2018 for GI bleeding after presenting with a hemoglobin of 5. She received paclitaxel transfusion and EGD showed no acute source of bleeding. Colonoscopy did show a few small ulcers in the sigmoid and descending colon. Since her discharge, she has resumed her activities of daily living and back to her baseline function. She still reasonably active although she is fatigued quite easily.  She continues to receive Aranesp without any major complications. She believes her function and performance status improves with a hemoglobin close to 11.  She has not reported any headaches or blurry vision or double vision. She does not report any chest pain shortness of breath or difficulty breathing. She does not report any nausea or vomiting or abdominal pain. She does not report any genitourinary complaints. She has not reported any bleeding or clotting tendencies. She has not reported any lymphadenopathy or petechiae. Rest of her review of systems unremarkable.  Medications: I have reviewed the patient's current medications.  Current Outpatient Prescriptions  Medication Sig Dispense Refill  . ALPRAZolam (XANAX) 0.5 MG tablet Take 0.5 mg by mouth at bedtime.     [name] amLODipine (NORVASC) 5 MG tablet Take 1 tablet (5 mg total) by mouth daily. 90 tablet 3  . carvedilol (COREG) 12.5 MG tablet TAKE 1 TABLET(12.5 MG) BY MOUTH TWICE DAILY 60 tablet 9  . cholecalciferol (VITAMIN D) 1000  UNITS tablet Take 1,000 Units by mouth daily.    . Darbepoetin Alfa (ARANESP) 300 MCG/0.6ML SOSY injection Inject 300 mcg into the skin once.    . furosemide (LASIX) 20 MG tablet Take 20 mg by mouth daily. As directed    . losartan (COZAAR) 25 MG tablet Take 1 tablet (25 mg total) by mouth daily. 90 tablet 3  . pantoprazole (PROTONIX) 20 MG tablet Take 1 tablet (20 mg total) by mouth daily. 30 tablet 0  . rosuvastatin (CRESTOR) 5 MG tablet TAKE 1 TABLET(5 MG) BY MOUTH DAILY 90 tablet 3  . sevelamer carbonate (RENVELA) 800 MG tablet     . sodium bicarbonate 650 MG tablet Take 650 mg by mouth 2 (two) times daily.    [name] trimethoprim (TRIMPEX) 100 MG tablet Take 50 mg by mouth daily.     . VELTASSA 8.4 G packet Take 8.4 g by mouth daily after lunch.      No current facility-administered medications for this visit.      Allergies:  Allergies  Allergen Reactions  . Lipitor [Atorvastatin] Other (See Comments)    Stomach pain  . Strawberry Extract Rash    Past Medical History, Surgical history, Social history, and Family History were reviewed and updated.    Physical Exam: Blood pressure (!) 137/58, pulse 61, temperature 97.7 F (36.5 C), temperature source Oral, resp. rate 17, height 5\' 8"  (1.727 m), weight 128 lb 1.6 oz (58.1 kg), SpO2 100 %.   ECOG: 1 General appearance: Chronically ill-appearing woman appeared without distress. Head: Normocephalic, without obvious abnormality no oral thrush or ulcers. Neck: no adenopathy  and no carotid bruit no thyroid masses. Lymph nodes: Cervical, supraclavicular, and axillary nodes normal. Heart:regular rate and rhythm, S1, S2 normal, no murmur, click, rub or gallop Lung:chest clear, no wheezing, rales, normal symmetric air entry.  Abdomen: soft, non-tender, without masses or organomegaly no shifting dullness or ascites. EXT:no erythema, induration, or nodules   Lab Results: Lab Results  Component Value Date   WBC 5.2 01/04/2017   HGB 9.5  (L) 01/04/2017   HCT 29.0 (L) 01/04/2017   MCV 98.7 01/04/2017   PLT 89 (L) 01/04/2017     Chemistry      Component Value Date/Time   NA 137 11/03/2016 0332   NA 136 08/03/2016 1006   K 4.1 11/03/2016 0332   K 5.0 08/03/2016 1006   CL 110 11/03/2016 0332   CL 101 08/16/2012 0757   CO2 17 (L) 11/03/2016 0332   CO2 20 (L) 08/03/2016 1006   BUN 50 (H) 11/03/2016 0332   BUN 44.9 (H) 08/03/2016 1006   CREATININE 3.62 (H) 11/03/2016 0332   CREATININE 3.6 (HH) 08/03/2016 1006      Component Value Date/Time   CALCIUM 8.1 (L) 11/03/2016 0332   CALCIUM 9.3 08/03/2016 1006   ALKPHOS 31 (L) 10/31/2016 1159   ALKPHOS 66 08/03/2016 1006   AST 14 (L) 10/31/2016 1159   AST 19 08/03/2016 1006   ALT 11 (L) 10/31/2016 1159   ALT 15 08/03/2016 1006   BILITOT 0.2 (L) 10/31/2016 1159   BILITOT 0.43 08/03/2016 1006      Results for Jeanne, [name] (MRN [id number]) as of 01/04/2017 09:50  Ref. Range 11/23/2016 08:56  Iron Latest Ref Range: 41 - 142 ug/dL 61  UIBC Latest Ref Range: 120 - 384 ug/dL 146  TIBC Latest Ref Range: 236 - 444 ug/dL 208 (L)  %SAT Latest Ref Range: 21 - 57 % 30  Ferritin Latest Ref Range: 9 - 269 ng/ml 171    Impression and Plan:   76 year old female with the following issues:   1. Anemia of renal disease. She is currently on Aranesp 300 g every 3 weeks to keep her hemoglobin above 10.   Her iron studies obtained on 08/03/2016 showed no evidence of iron deficiency that required supplementation.  Her hemoglobin today is 8.5 and will require Aranesp therapy. The plan is to continue with this every 3 weeks to keep her hemoglobin above 11.  2. 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 She has no signs or symptoms of bleeding we'll continue to monitor.   3. Renal failure: Her creatinine clearance is close to 10 mL/m but have not received dialysis at  this time.  4. GI bleeding: This has resolved at this time. No signs or symptoms of bleeding noted.  5. Follow-up: CBC and Aranesp injection every 3 weeks. Follow-up with a clinical visit will be in September of 2018.      Bienvenido Proehl 5/23/201810:03 AM

## 2017-01-04 NOTE — Patient Instructions (Signed)
Darbepoetin Alfa injection What is this medicine? DARBEPOETIN ALFA (dar be POE e tin AL fa) helps your body make more red blood cells. It is used to treat anemia caused by chronic kidney failure and chemotherapy. This medicine may be used for other purposes; ask your health care provider or pharmacist if you have questions. COMMON BRAND NAME(S): Aranesp What should I tell my health care provider before I take this medicine? They need to know if you have any of these conditions: -blood clotting disorders or history of blood clots -cancer patient not on chemotherapy -cystic fibrosis -heart disease, such as angina, heart failure, or a history of a heart attack -hemoglobin level of 12 g/dL or greater -high blood pressure -low levels of folate, iron, or vitamin B12 -seizures -an unusual or allergic reaction to darbepoetin, erythropoietin, albumin, hamster proteins, latex, other medicines, foods, dyes, or preservatives -pregnant or trying to get pregnant -breast-feeding How should I use this medicine? This medicine is for injection into a vein or under the skin. It is usually given by a health care professional in a hospital or clinic setting. If you get this medicine at home, you will be taught how to prepare and give this medicine. Do not shake the solution before you withdraw a dose. Use exactly as directed. Take your medicine at regular intervals. Do not take your medicine more often than directed. It is important that you put your used needles and syringes in a special sharps container. Do not put them in a trash can. If you do not have a sharps container, call your pharmacist or healthcare provider to get one. Talk to your pediatrician regarding the use of this medicine in children. While this medicine may be used in children as young as 1 year for selected conditions, precautions do apply. Overdosage: If you think you have taken too much of this medicine contact a poison control center or  emergency room at once. NOTE: This medicine is only for you. Do not share this medicine with others. What if I miss a dose? If you miss a dose, take it as soon as you can. If it is almost time for your next dose, take only that dose. Do not take double or extra doses. What may interact with this medicine? Do not take this medicine with any of the following medications: -epoetin alfa This list may not describe all possible interactions. Give your health care provider a list of all the medicines, herbs, non-prescription drugs, or dietary supplements you use. Also tell them if you smoke, drink alcohol, or use illegal drugs. Some items may interact with your medicine. What should I watch for while using this medicine? Visit your prescriber or health care professional for regular checks on your progress and for the needed blood tests and blood pressure measurements. It is especially important for the doctor to make sure your hemoglobin level is in the desired range, to limit the risk of potential side effects and to give you the best benefit. Keep all appointments for any recommended tests. Check your blood pressure as directed. Ask your doctor what your blood pressure should be and when you should contact him or her. As your body makes more red blood cells, you may need to take iron, folic acid, or vitamin B supplements. Ask your doctor or health care provider which products are right for you. If you have kidney disease continue dietary restrictions, even though this medication can make you feel better. Talk with your doctor or health   care professional about the foods you eat and the vitamins that you take. What side effects may I notice from receiving this medicine? Side effects that you should report to your doctor or health care professional as soon as possible: -allergic reactions like skin rash, itching or hives, swelling of the face, lips, or tongue -breathing problems -changes in vision -chest  pain -confusion, trouble speaking or understanding -feeling faint or lightheaded, falls -high blood pressure -muscle aches or pains -pain, swelling, warmth in the leg -rapid weight gain -severe headaches -sudden numbness or weakness of the face, arm or leg -trouble walking, dizziness, loss of balance or coordination -seizures (convulsions) -swelling of the ankles, feet, hands -unusually weak or tired Side effects that usually do not require medical attention (report to your doctor or health care professional if they continue or are bothersome): -diarrhea -fever, chills (flu-like symptoms) -headaches -nausea, vomiting -redness, stinging, or swelling at site where injected This list may not describe all possible side effects. Call your doctor for medical advice about side effects. You may report side effects to FDA at 1-800-FDA-1088. Where should I keep my medicine? Keep out of the reach of children. Store in a refrigerator between 2 and 8 degrees C (36 and 46 degrees F). Do not freeze. Do not shake. Throw away any unused portion if using a single-dose vial. Throw away any unused medicine after the expiration date. NOTE: This sheet is a summary. It may not cover all possible information. If you have questions about this medicine, talk to your doctor, pharmacist, or health care provider.  2015, [employer]. (2008-07-15 10:23:57)  

## 2017-01-05 DIAGNOSIS — X32XXXA Exposure to sunlight, initial encounter: Secondary | ICD-10-CM | POA: Diagnosis not present

## 2017-01-05 DIAGNOSIS — L570 Actinic keratosis: Secondary | ICD-10-CM | POA: Diagnosis not present

## 2017-01-05 DIAGNOSIS — L720 Epidermal cyst: Secondary | ICD-10-CM | POA: Diagnosis not present

## 2017-01-05 DIAGNOSIS — L821 Other seborrheic keratosis: Secondary | ICD-10-CM | POA: Diagnosis not present

## 2017-01-10 DIAGNOSIS — N185 Chronic kidney disease, stage 5: Secondary | ICD-10-CM | POA: Diagnosis not present

## 2017-01-12 DIAGNOSIS — E875 Hyperkalemia: Secondary | ICD-10-CM | POA: Diagnosis not present

## 2017-01-12 DIAGNOSIS — I120 Hypertensive chronic kidney disease with stage 5 chronic kidney disease or end stage renal disease: Secondary | ICD-10-CM | POA: Diagnosis not present

## 2017-01-12 DIAGNOSIS — N185 Chronic kidney disease, stage 5: Secondary | ICD-10-CM | POA: Diagnosis not present

## 2017-01-12 DIAGNOSIS — E1122 Type 2 diabetes mellitus with diabetic chronic kidney disease: Secondary | ICD-10-CM | POA: Diagnosis not present

## 2017-01-12 DIAGNOSIS — E872 Acidosis: Secondary | ICD-10-CM | POA: Diagnosis not present

## 2017-01-12 DIAGNOSIS — D631 Anemia in chronic kidney disease: Secondary | ICD-10-CM | POA: Diagnosis not present

## 2017-01-20 NOTE — Addendum Note (Signed)
Addendum  created 01/20/17 1119 by Lyn Hollingshead, MD   Sign clinical note

## 2017-01-25 ENCOUNTER — Ambulatory Visit (HOSPITAL_BASED_OUTPATIENT_CLINIC_OR_DEPARTMENT_OTHER): Payer: Medicare HMO

## 2017-01-25 ENCOUNTER — Other Ambulatory Visit (HOSPITAL_BASED_OUTPATIENT_CLINIC_OR_DEPARTMENT_OTHER): Payer: Medicare HMO

## 2017-01-25 VITALS — BP 138/60 | HR 66 | Temp 97.2°F | Resp 18

## 2017-01-25 DIAGNOSIS — D631 Anemia in chronic kidney disease: Secondary | ICD-10-CM

## 2017-01-25 DIAGNOSIS — D509 Iron deficiency anemia, unspecified: Secondary | ICD-10-CM

## 2017-01-25 DIAGNOSIS — N189 Chronic kidney disease, unspecified: Secondary | ICD-10-CM

## 2017-01-25 DIAGNOSIS — D696 Thrombocytopenia, unspecified: Secondary | ICD-10-CM

## 2017-01-25 LAB — CBC WITH DIFFERENTIAL/PLATELET
BASO%: 0.7 % (ref 0.0–2.0)
BASOS ABS: 0 10*3/uL (ref 0.0–0.1)
EOS ABS: 0.1 10*3/uL (ref 0.0–0.5)
EOS%: 3.1 % (ref 0.0–7.0)
HEMATOCRIT: 33 % — AB (ref 34.8–46.6)
HEMOGLOBIN: 10.6 g/dL — AB (ref 11.6–15.9)
LYMPH%: 27.6 % (ref 14.0–49.7)
MCH: 31.8 pg (ref 25.1–34.0)
MCHC: 32.1 g/dL (ref 31.5–36.0)
MCV: 99.1 fL (ref 79.5–101.0)
MONO#: 0.6 10*3/uL (ref 0.1–0.9)
MONO%: 12.4 % (ref 0.0–14.0)
NEUT#: 2.6 10*3/uL (ref 1.5–6.5)
NEUT%: 56.2 % (ref 38.4–76.8)
Platelets: 109 10*3/uL — ABNORMAL LOW (ref 145–400)
RBC: 3.33 10*6/uL — ABNORMAL LOW (ref 3.70–5.45)
RDW: 16.3 % — AB (ref 11.2–14.5)
WBC: 4.5 10*3/uL (ref 3.9–10.3)
lymph#: 1.3 10*3/uL (ref 0.9–3.3)

## 2017-01-25 MED ORDER — DARBEPOETIN ALFA 300 MCG/0.6ML IJ SOSY
300.0000 ug | PREFILLED_SYRINGE | Freq: Once | INTRAMUSCULAR | Status: AC
  Administered 2017-01-25: 300 ug via SUBCUTANEOUS
  Filled 2017-01-25: qty 0.6

## 2017-01-25 NOTE — Patient Instructions (Signed)
Darbepoetin Alfa injection What is this medicine? DARBEPOETIN ALFA (dar be POE e tin AL fa) helps your body make more red blood cells. It is used to treat anemia caused by chronic kidney failure and chemotherapy. This medicine may be used for other purposes; ask your health care provider or pharmacist if you have questions. COMMON BRAND NAME(S): Aranesp What should I tell my health care provider before I take this medicine? They need to know if you have any of these conditions: -blood clotting disorders or history of blood clots -cancer patient not on chemotherapy -cystic fibrosis -heart disease, such as angina, heart failure, or a history of a heart attack -hemoglobin level of 12 g/dL or greater -high blood pressure -low levels of folate, iron, or vitamin B12 -seizures -an unusual or allergic reaction to darbepoetin, erythropoietin, albumin, hamster proteins, latex, other medicines, foods, dyes, or preservatives -pregnant or trying to get pregnant -breast-feeding How should I use this medicine? This medicine is for injection into a vein or under the skin. It is usually given by a health care professional in a hospital or clinic setting. If you get this medicine at home, you will be taught how to prepare and give this medicine. Do not shake the solution before you withdraw a dose. Use exactly as directed. Take your medicine at regular intervals. Do not take your medicine more often than directed. It is important that you put your used needles and syringes in a special sharps container. Do not put them in a trash can. If you do not have a sharps container, call your pharmacist or healthcare provider to get one. Talk to your pediatrician regarding the use of this medicine in children. While this medicine may be used in children as young as 1 year for selected conditions, precautions do apply. Overdosage: If you think you have taken too much of this medicine contact a poison control center or  emergency room at once. NOTE: This medicine is only for you. Do not share this medicine with others. What if I miss a dose? If you miss a dose, take it as soon as you can. If it is almost time for your next dose, take only that dose. Do not take double or extra doses. What may interact with this medicine? Do not take this medicine with any of the following medications: -epoetin alfa This list may not describe all possible interactions. Give your health care provider a list of all the medicines, herbs, non-prescription drugs, or dietary supplements you use. Also tell them if you smoke, drink alcohol, or use illegal drugs. Some items may interact with your medicine. What should I watch for while using this medicine? Visit your prescriber or health care professional for regular checks on your progress and for the needed blood tests and blood pressure measurements. It is especially important for the doctor to make sure your hemoglobin level is in the desired range, to limit the risk of potential side effects and to give you the best benefit. Keep all appointments for any recommended tests. Check your blood pressure as directed. Ask your doctor what your blood pressure should be and when you should contact him or her. As your body makes more red blood cells, you may need to take iron, folic acid, or vitamin B supplements. Ask your doctor or health care provider which products are right for you. If you have kidney disease continue dietary restrictions, even though this medication can make you feel better. Talk with your doctor or health   care professional about the foods you eat and the vitamins that you take. What side effects may I notice from receiving this medicine? Side effects that you should report to your doctor or health care professional as soon as possible: -allergic reactions like skin rash, itching or hives, swelling of the face, lips, or tongue -breathing problems -changes in vision -chest  pain -confusion, trouble speaking or understanding -feeling faint or lightheaded, falls -high blood pressure -muscle aches or pains -pain, swelling, warmth in the leg -rapid weight gain -severe headaches -sudden numbness or weakness of the face, arm or leg -trouble walking, dizziness, loss of balance or coordination -seizures (convulsions) -swelling of the ankles, feet, hands -unusually weak or tired Side effects that usually do not require medical attention (report to your doctor or health care professional if they continue or are bothersome): -diarrhea -fever, chills (flu-like symptoms) -headaches -nausea, vomiting -redness, stinging, or swelling at site where injected This list may not describe all possible side effects. Call your doctor for medical advice about side effects. You may report side effects to FDA at 1-800-FDA-1088. Where should I keep my medicine? Keep out of the reach of children. Store in a refrigerator between 2 and 8 degrees C (36 and 46 degrees F). Do not freeze. Do not shake. Throw away any unused portion if using a single-dose vial. Throw away any unused medicine after the expiration date. NOTE: This sheet is a summary. It may not cover all possible information. If you have questions about this medicine, talk to your doctor, pharmacist, or health care provider.  2015, [employer]. (2008-07-15 10:23:57)  

## 2017-02-07 ENCOUNTER — Telehealth: Payer: Self-pay | Admitting: *Deleted

## 2017-02-07 ENCOUNTER — Ambulatory Visit (INDEPENDENT_AMBULATORY_CARE_PROVIDER_SITE_OTHER): Payer: Medicare HMO | Admitting: Nurse Practitioner

## 2017-02-07 ENCOUNTER — Encounter: Payer: Self-pay | Admitting: Nurse Practitioner

## 2017-02-07 VITALS — BP 112/62 | HR 59 | Ht 68.0 in | Wt 126.1 lb

## 2017-02-07 DIAGNOSIS — N186 End stage renal disease: Secondary | ICD-10-CM

## 2017-02-07 DIAGNOSIS — E7800 Pure hypercholesterolemia, unspecified: Secondary | ICD-10-CM | POA: Diagnosis not present

## 2017-02-07 DIAGNOSIS — I259 Chronic ischemic heart disease, unspecified: Secondary | ICD-10-CM

## 2017-02-07 NOTE — Patient Instructions (Addendum)
We will be checking the following labs today - NONE   Medication Instructions:    Continue with your current medicines.     Testing/Procedures To Be Arranged:  N/A  Follow-Up:   See me in 4 months    Other Special Instructions:   Let's request the office note from Dr. Verdia Kuba so I can review.     If you need a refill on your cardiac medications before your next appointment, please call your pharmacy.   Call the [place] office at [phone] if you have any questions, problems or concerns.

## 2017-02-07 NOTE — Telephone Encounter (Signed)
S/w Lisa/Cindy @ [phone] at Dr. Lorane Gell office to fax over recent ov note.

## 2017-02-07 NOTE — Progress Notes (Addendum)
CARDIOLOGY OFFICE NOTE  Date:  02/07/2017    [name] Date of Birth: 1940/09/17 Medical Record #[id number]  PCP:  [name], MD  Cardiologist:  Annabell Howells   Chief Complaint  Patient presents with  . Coronary Artery Disease    3 month check. Seen for Dr. Aundra Dubin    History of Present Illness: [name] is a 76 y.o. female who presents today for a 3 month check. Seen for Dr. Aundra Dubin.   She has a h/o CAD s/p CABG in 5701 and diastolic CHF. She has chronic anemia, balance issues, HTN, DM, HLD, CKD, chronic back pain, anxiety and known valvular heart disease.   I have seen her several times over the last few years.  She is followed by Nephrology in [place] - has seen Dr. Kellie Simmering for AV fistula consult. Kidneys are failing and she is nearing dialysis.   Seen back in Julyof 2017 - feeling ok. EKG with more changes. Updated her Myoview - see below. Brought back for discussion - opted to avoid cardiac catheterization and continue with medical management. She has had no desire to go on dialysis.  I saw her back in March - she had been in the hospital a few times since I had last seen.  Had had a mild stroke the month prior - noted to have right thalamic small infarct due to small vessel disease - carotids with 40 to 59% stenosis, Echo with normal EF, had her dose of aspirin increased to 325 mg. Then she had some major spontaneously resolving GI bleeding and had to have colonoscopy. Small erosions noted. Negative EGD. She required transfusion with total of 6 units of blood. Aspirin held until seen back by GI. She was feeling better clinically. No chest pain noted. Overall prognosis felt to be quite tenuous.  Comes in today. Here with her husband. Seems to be holding her own. No more bleeding. Followed by hematology and on Aranesp. Says her nephrologist says her kidney function is "holding on" - not on dialysis. Making lots of urine. No chest pain. She was released from GI -  unclear about starting back on aspirin. She remains anemic. She feels like she is doing ok. Her breathing is stable. No syncope. Rarely dizzy and this is typically if she gets up too fast.   Past Medical History:  1. CAD: Pt presented 2/10 to [facility] with NSTEMI and diastolic CHF exacerbation. LHC was done 3/10 showing 99% pRCA stenosis and 80% calcified pLAD stenosis with L=>R collaterals. Pt was referred for CABG which was done by Dr. Prescott Gum with LIMA-LAD, SVG-RCA, SVG-OM.  2. Diastolic CHF: Echo (7/79) showed EF 55-65%, mild LVH, diastolic dysfunction, mild AS with mean gradient 12 mmHg, PASP 43 mmHg. Echo (2/12): EF 55-60%, mild LVH, mild AS (mean gradient 12), PA systolic pressure 32 mmHg. Echo (5/15) with EF 55%, mild MR, mild aortic stenosis.  3. CKD 4. DM2  5. HTN  6. Hyperlipidemia: nausea/GI discomfort with atorvastatin, ? Balance problems with pravastatin.  7. Anxiety  8. Chronic low back pain  9. History of thrombocytopenia  10. 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 Followed by hematology, getting Aranesp injections 11. Aortic stenosis: mean gradient 12 mmHg in 2/12. Mean gradient 18 mmHg with AVA 1.6 cm^2 in 5/15.  12. Carotid stenosis: 40-59% bilateral ICA stenosis in 3/90. 30-09% LICA stenosis in 2/33. Carotids (0/07) with 62-26% LICA stenosis.  13. PAD: ABIs 3/12 were normal  but TBIs were mildly decreased. ABIs/TBIs (10/13) normal.    Past Medical History:  Diagnosis Date  . Anemia    Pt is taking iron.   [name] Anxiety   . Arthritis   . Carotid stenosis    40-59% bilateral ICA stenosis in 2/12.  . Chronic low back pain   . CKD (chronic kidney disease)    Dr. Audie Clear at [facility]  . Coronary artery disease    Pt presented 2/10 to [facility] with NSTEMI and diastolic CHF exacerbation.  LHC was done  3/10 showing 99% pRCA stenosis and 80% calcified pLAD stenosis with L=>R collaterals.  Pt was referred  for CABG which was done by  Dr. Prescott Gum with LIMA-LAD, SVG-RCA, SVG-OM.  . Diabetes mellitus   . Diabetic neuropathy ([place])   . Diastolic CHF ([facility])    Echo (2/10) showed EF 55-65%, mild LVH, diastolic dysfunction, mild AS with mean gradient 12 mmHg, PASP 43 mmHg.  Echo (2/12): EF 55-60%, mild LVH, mild AS (mean gradient 12), PA systolic pressure 32 mmHg.     [name] GERD (gastroesophageal reflux disease)   . Heart murmur   . Hyperlipidemia   . Hypertension   . Mild aortic stenosis    mean gradient 12 mmHg in 2/12.  . Myocardial infarction ([place])    "mild"  . Pneumonia   . PONV (postoperative nausea and vomiting)   . Thrombocytopenia ([place])   . Unsteady gait     Past Surgical History:  Procedure Laterality Date  . AV FISTULA PLACEMENT Left 01/30/2015   Procedure: Creation of a Radial Cephalic AV Fistula left wrist;  Surgeon: [name], MD;  Location: [place];  Service: Vascular;  Laterality: Left;  . BACK SURGERY     multiple  . BREAST SURGERY     biopsy  . CARDIAC CATHETERIZATION    . CATARACT EXTRACTION W/ INTRAOCULAR LENS  IMPLANT, BILATERAL    . COLONOSCOPY Left 11/03/2016   Procedure: COLONOSCOPY;  Surgeon: [name], MD;  Location: [facility] ENDOSCOPY;  Service: Endoscopy;  Laterality: Left;  . COLONOSCOPY W/ BIOPSIES AND POLYPECTOMY    . CORONARY ARTERY BYPASS GRAFT  09/2008   pt with NSTEMI and diastolic CHF exacerbation.  LHC was done  3/10 showing 99% pRCA stenosis and 80% calcified pLAD stenosis with L=>R collaterals.  Pt was referred  for CABG which was done by Dr. Prescott Gum with LIMA-LAD, SVG-RCA, SVG-OM.  [name] ESOPHAGOGASTRODUODENOSCOPY N/A 11/02/2016   Procedure: ESOPHAGOGASTRODUODENOSCOPY (EGD);  Surgeon: [name], MD;  Location[facility] ENDOSCOPY;  Service: Endoscopy;  Laterality: N/A;  . GIVENS CAPSULE STUDY N/A 11/29/2016   Procedure: GIVENS CAPSULE STUDY;  Surgeon: [name], MD;  Location: [place];  Service: Endoscopy;  Laterality: N/A;  . REVISON OF ARTERIOVENOUS FISTULA Left 07/02/2015    Procedure: REVISON OF LEFT RADIOCEPHALIC ARTERIOVENOUS FISTULA;  Surgeon: [name], MD;  Location[facility] OR;  Service: Vascular;  Laterality: Left;     Medications: Current Meds  Medication Sig  . ALPRAZolam (XANAX) 0.5 MG tablet Take 0.5 mg by mouth at bedtime.   [name] amLODipine (NORVASC) 5 MG tablet Take 1 tablet (5 mg total) by mouth daily.  . carvedilol (COREG) 12.5 MG tablet TAKE 1 TABLET(12.5 MG) BY MOUTH TWICE DAILY  . cholecalciferol (VITAMIN D) 1000 UNITS tablet Take 1,000 Units by mouth daily.  . Darbepoetin Alfa (ARANESP) 300 MCG/0.6ML SOSY injection Inject 300 mcg into the skin once.  . furosemide (LASIX) 20 MG tablet Take 20 mg  by mouth daily. As directed  . losartan (COZAAR) 25 MG tablet Take 1 tablet (25 mg total) by mouth daily.  . rosuvastatin (CRESTOR) 5 MG tablet TAKE 1 TABLET(5 MG) BY MOUTH DAILY  . sevelamer carbonate (RENVELA) 800 MG tablet   . sodium bicarbonate 650 MG tablet Take 650 mg by mouth 2 (two) times daily.  [name] trimethoprim (TRIMPEX) 100 MG tablet Take 50 mg by mouth daily.   . VELTASSA 8.4 G packet Take 8.4 g by mouth daily after lunch.      Allergies: Allergies  Allergen Reactions  . Lipitor [Atorvastatin] Other (See Comments)    Stomach pain  . Strawberry Extract Rash    Social History: The patient  reports that she has quit smoking. Her smoking use included Cigarettes. She has never used smokeless tobacco. She reports that she drinks alcohol. She reports that she does not use drugs.   Family History: The patient's She was adopted. Family history is unknown by patient.   Review of Systems: Please see the history of present illness.   Otherwise, the review of systems is positive for none.   All other systems are reviewed and negative.   Physical Exam: VS:  BP 112/62 (BP Location: Right Arm, Patient Position: Sitting, Cuff Size: Normal)   Pulse (!) 59   Ht 5\' 8"  (1.727 m)   Wt 126 lb 1.9 oz (57.2 kg)   SpO2 100% Comment: at rest  BMI 19.18  kg/m  .  BMI Body mass index is 19.18 kg/m.  Wt Readings from Last 3 Encounters:  02/07/17 126 lb 1.9 oz (57.2 kg)  01/04/17 128 lb 1.6 oz (58.1 kg)  11/29/16 131 lb (59.4 kg)    General: Pleasant. She looks chronically ill. Color always sallow. She is alert and in no acute distress.   HEENT: Normal.   Neck: Supple, no JVD, carotid bruits, or masses noted.  Cardiac: Regular rate and rhythm. Harsh outflow murmur. No edema.  Respiratory:  Lungs are clear to auscultation bilaterally with normal work of breathing.  GI: Soft and nontender.  MS: No deformity or atrophy. Gait and ROM intact.  Skin: Warm and dry. Color is sallow.   Neuro:  Strength and sensation are intact and no gross focal deficits noted.  Psych: Alert, appropriate and with normal affect.   LABORATORY DATA:  EKG:  EKG is not ordered today.  Lab Results  Component Value Date   WBC 4.5 01/25/2017   HGB 10.6 (L) 01/25/2017   HCT 33.0 (L) 01/25/2017   PLT 109 (L) 01/25/2017   GLUCOSE 112 (H) 11/03/2016   CHOL 99 09/27/2016   TRIG 84 09/27/2016   HDL 51 09/27/2016   LDLDIRECT 34.4 11/17/2008   LDLCALC 31 09/27/2016   ALT 11 (L) 10/31/2016   AST 14 (L) 10/31/2016   NA 137 11/03/2016   K 4.1 11/03/2016   CL 110 11/03/2016   CREATININE 3.62 (H) 11/03/2016   BUN 50 (H) 11/03/2016   CO2 17 (L) 11/03/2016   TSH 2.185 01/17/2014   INR 1.4 10/16/2008   HGBA1C 5.4 09/27/2016     BNP (last 3 results) No results for input(s): BNP in the last 8760 hours.  ProBNP (last 3 results) No results for input(s): PROBNP in the last 8760 hours.   Other Studies Reviewed Today:  Myoview Study Highlights 02/2016    Nuclear stress EF: 54%. There is mid inferior wall hypokinesis  There was no ST segment deviation noted during stress.  Defect 1:  There is a medium defect of moderate severity present in the basal inferior and mid inferior location.  The mid inferior wall perfusion defect is reversible consistent with  ischemia. The basal inferior wall defect is mostly fixed consistent with infarct.  This is an intermediate risk study. Consider occlusion of SVG to RCA graft.  Candee Furbish, MD   Echo Study Conclusions from 09/2016  - Left ventricle: The cavity size was normal. There was moderate focal basal and mild concentric hypertrophy. Systolic function was normal. The estimated ejection fraction was in the range of 55% to 60%. Wall motion was normal; there were no regional wall motion abnormalities. Features are consistent with a pseudonormal left ventricular filling pattern, with concomitant abnormal relaxation and increased filling pressure (grade 2 diastolic dysfunction). - Aortic valve: The AV appears mildly to moderately stenotic by doppler with a mean AV gradient of 4mmHg. The calculated AVA is 0.9cm2 and likely underestimates with AVA due to inaccurate measurment of the LVOT. Visually there appears to be mild to moderate AS. Valve area (VTI): 0.9 cm^2. Valve area (Vmax): 0.89 cm^2. Valve area (Vmean): 0.86 cm^2. - Mitral valve: There was trivial regurgitation. - Right ventricle: Systolic function was mildly reduced. - Pulmonic valve: There was mild regurgitation. - Pulmonary arteries: PA peak pressure: 33 mm Hg (S).   Carotid Doppler Summary: Findings consistent with a high end 1- 39 percent stenosis involving the right internal carotid artery and the left internal carotid artery. Can not rule out higher grade stenosis due to severe amount of shadowing plaque in bilateral carotid arteries and difficulty visualizing vessels. Severe amount of plaque are subjectively suggestive of a 40-59% percent stenosis bilaterally. The right vertebral demonstrates atypical flow, left vertebral is patent and antegrade.  Other specific details can be found in the table(s) above. Prepared and Electronically Authenticated by  Antony Contras  MD 2018-02-14T09:22:41   Assessment/Plan:   1. CAD - Status post CABG from 2010. Has had prior intermediate Myoview - reviewed previouslywith Dr. Aundra Dubin. No active chest pain. Will continue with her current medical regimen for now. At some point she will be on dialysis and then we may need to proceed with left and right heart cath. Her overall prognosis still looks quite tenuous to me - especially with all these recent issues. Fortunately she has no symptoms at this time.   2. AORTIC STENOSIS  No symptoms - last echo noted - no cardinal symptoms.   3. Recent stroke - tried to be on higher dose aspirin - this led to major GI bleeding. She remains anemic. Neurologically looks stable.   4. HYPERTENSION - BP ok on current regimen. No change with her current regimen.   5. Hyperlipidemia - on statin therapy.   6. Progressive CKD - followed by Renal in [place]       7. Prior GI bleed:  +FOBT on admission. EGD 3/21 by Dr. Collene Mares negative.  Subsequent colonoscopy w/diverticulosis and afew small ulcers/erosions in the sigmoid and ascending colon. She is now off of aspirin. She saw GI in follow up. Unclear about trying to restart aspirin - I am pretty hesitant to try especially in light of ongoing anemia. Will request Dr. Lorie Apley last note and review.    Current medicines are reviewed with the patient today.  The patient does not have concerns regarding medicines other than what has been noted above.  The following changes have been made:  See above.  Labs/ tests ordered today include:   No orders  of the defined types were placed in this encounter.    Disposition:   FU with me in 4 months.   Patient is agreeable to this plan and will call if any problems develop in the interim.   SignedTruitt Merle, NP  02/07/2017 10:38 AM  [place] [address], [place]  [address] Phone: [phone] Fax: [phone]       Addendum: Received Dr. Lorie Apley last OV note from 11/22/2016 - no mention of resuming aspirin. Capsule endoscopy recommended. Patient was to return for 2 weeks follow up - does not look like this occurred. I would favor having Dr. Collene Mares weigh in on restarting low dose aspirin.   [name], RN, [facility] [address] [place], [place]  [id number] [phone]

## 2017-02-08 ENCOUNTER — Telehealth: Payer: Self-pay | Admitting: *Deleted

## 2017-02-08 NOTE — Telephone Encounter (Signed)
-----   Message from [name], NP sent at 02/08/2017  7:41 AM EDT ----- Can you let her know that I saw Dr. Lorie Apley note - no mention of resuming aspirin noted. She was to be seen back for follow up.   Would she go back to see her to get Dr. Lorie Apley opinion regarding resumption of low dose aspirin?  lori

## 2017-02-08 NOTE — Telephone Encounter (Signed)
Pt does not want to resume asa at this time.  Cecille Rubin is aware of pt's decision.

## 2017-02-10 DIAGNOSIS — N185 Chronic kidney disease, stage 5: Secondary | ICD-10-CM | POA: Diagnosis not present

## 2017-02-16 DIAGNOSIS — E875 Hyperkalemia: Secondary | ICD-10-CM | POA: Diagnosis not present

## 2017-02-16 DIAGNOSIS — D631 Anemia in chronic kidney disease: Secondary | ICD-10-CM | POA: Diagnosis not present

## 2017-02-16 DIAGNOSIS — M899 Disorder of bone, unspecified: Secondary | ICD-10-CM | POA: Diagnosis not present

## 2017-02-16 DIAGNOSIS — I120 Hypertensive chronic kidney disease with stage 5 chronic kidney disease or end stage renal disease: Secondary | ICD-10-CM | POA: Diagnosis not present

## 2017-02-16 DIAGNOSIS — E1122 Type 2 diabetes mellitus with diabetic chronic kidney disease: Secondary | ICD-10-CM | POA: Diagnosis not present

## 2017-02-16 DIAGNOSIS — N185 Chronic kidney disease, stage 5: Secondary | ICD-10-CM | POA: Diagnosis not present

## 2017-03-02 DIAGNOSIS — R69 Illness, unspecified: Secondary | ICD-10-CM | POA: Diagnosis not present

## 2017-03-08 ENCOUNTER — Ambulatory Visit (HOSPITAL_BASED_OUTPATIENT_CLINIC_OR_DEPARTMENT_OTHER): Payer: Medicare HMO

## 2017-03-08 ENCOUNTER — Other Ambulatory Visit (HOSPITAL_BASED_OUTPATIENT_CLINIC_OR_DEPARTMENT_OTHER): Payer: Medicare HMO

## 2017-03-08 VITALS — BP 128/60 | HR 57 | Temp 97.2°F | Resp 20

## 2017-03-08 DIAGNOSIS — N189 Chronic kidney disease, unspecified: Secondary | ICD-10-CM | POA: Diagnosis not present

## 2017-03-08 DIAGNOSIS — D509 Iron deficiency anemia, unspecified: Secondary | ICD-10-CM

## 2017-03-08 DIAGNOSIS — D631 Anemia in chronic kidney disease: Secondary | ICD-10-CM

## 2017-03-08 DIAGNOSIS — D696 Thrombocytopenia, unspecified: Secondary | ICD-10-CM

## 2017-03-08 LAB — CBC WITH DIFFERENTIAL/PLATELET
BASO%: 0.2 % (ref 0.0–2.0)
Basophils Absolute: 0 10*3/uL (ref 0.0–0.1)
EOS%: 3.4 % (ref 0.0–7.0)
Eosinophils Absolute: 0.2 10*3/uL (ref 0.0–0.5)
HCT: 26.3 % — ABNORMAL LOW (ref 34.8–46.6)
HGB: 8.5 g/dL — ABNORMAL LOW (ref 11.6–15.9)
LYMPH%: 29.8 % (ref 14.0–49.7)
MCH: 32.3 pg (ref 25.1–34.0)
MCHC: 32.3 g/dL (ref 31.5–36.0)
MCV: 100 fL (ref 79.5–101.0)
MONO#: 0.4 10*3/uL (ref 0.1–0.9)
MONO%: 8.3 % (ref 0.0–14.0)
NEUT#: 2.5 10*3/uL (ref 1.5–6.5)
NEUT%: 58.3 % (ref 38.4–76.8)
Platelets: 84 10*3/uL — ABNORMAL LOW (ref 145–400)
RBC: 2.63 10*6/uL — ABNORMAL LOW (ref 3.70–5.45)
RDW: 13.7 % (ref 11.2–14.5)
WBC: 4.4 10*3/uL (ref 3.9–10.3)
lymph#: 1.3 10*3/uL (ref 0.9–3.3)

## 2017-03-08 MED ORDER — DARBEPOETIN ALFA 300 MCG/0.6ML IJ SOSY
300.0000 ug | PREFILLED_SYRINGE | Freq: Once | INTRAMUSCULAR | Status: AC
  Administered 2017-03-08: 300 ug via SUBCUTANEOUS
  Filled 2017-03-08: qty 0.6

## 2017-03-08 NOTE — Patient Instructions (Signed)
Darbepoetin Alfa injection What is this medicine? DARBEPOETIN ALFA (dar be POE e tin AL fa) helps your body make more red blood cells. It is used to treat anemia caused by chronic kidney failure and chemotherapy. This medicine may be used for other purposes; ask your health care provider or pharmacist if you have questions. COMMON BRAND NAME(S): Aranesp What should I tell my health care provider before I take this medicine? They need to know if you have any of these conditions: -blood clotting disorders or history of blood clots -cancer patient not on chemotherapy -cystic fibrosis -heart disease, such as angina, heart failure, or a history of a heart attack -hemoglobin level of 12 g/dL or greater -high blood pressure -low levels of folate, iron, or vitamin B12 -seizures -an unusual or allergic reaction to darbepoetin, erythropoietin, albumin, hamster proteins, latex, other medicines, foods, dyes, or preservatives -pregnant or trying to get pregnant -breast-feeding How should I use this medicine? This medicine is for injection into a vein or under the skin. It is usually given by a health care professional in a hospital or clinic setting. If you get this medicine at home, you will be taught how to prepare and give this medicine. Do not shake the solution before you withdraw a dose. Use exactly as directed. Take your medicine at regular intervals. Do not take your medicine more often than directed. It is important that you put your used needles and syringes in a special sharps container. Do not put them in a trash can. If you do not have a sharps container, call your pharmacist or healthcare provider to get one. Talk to your pediatrician regarding the use of this medicine in children. While this medicine may be used in children as young as 1 year for selected conditions, precautions do apply. Overdosage: If you think you have taken too much of this medicine contact a poison control center or  emergency room at once. NOTE: This medicine is only for you. Do not share this medicine with others. What if I miss a dose? If you miss a dose, take it as soon as you can. If it is almost time for your next dose, take only that dose. Do not take double or extra doses. What may interact with this medicine? Do not take this medicine with any of the following medications: -epoetin alfa This list may not describe all possible interactions. Give your health care provider a list of all the medicines, herbs, non-prescription drugs, or dietary supplements you use. Also tell them if you smoke, drink alcohol, or use illegal drugs. Some items may interact with your medicine. What should I watch for while using this medicine? Visit your prescriber or health care professional for regular checks on your progress and for the needed blood tests and blood pressure measurements. It is especially important for the doctor to make sure your hemoglobin level is in the desired range, to limit the risk of potential side effects and to give you the best benefit. Keep all appointments for any recommended tests. Check your blood pressure as directed. Ask your doctor what your blood pressure should be and when you should contact him or her. As your body makes more red blood cells, you may need to take iron, folic acid, or vitamin B supplements. Ask your doctor or health care provider which products are right for you. If you have kidney disease continue dietary restrictions, even though this medication can make you feel better. Talk with your doctor or health   care professional about the foods you eat and the vitamins that you take. What side effects may I notice from receiving this medicine? Side effects that you should report to your doctor or health care professional as soon as possible: -allergic reactions like skin rash, itching or hives, swelling of the face, lips, or tongue -breathing problems -changes in vision -chest  pain -confusion, trouble speaking or understanding -feeling faint or lightheaded, falls -high blood pressure -muscle aches or pains -pain, swelling, warmth in the leg -rapid weight gain -severe headaches -sudden numbness or weakness of the face, arm or leg -trouble walking, dizziness, loss of balance or coordination -seizures (convulsions) -swelling of the ankles, feet, hands -unusually weak or tired Side effects that usually do not require medical attention (report to your doctor or health care professional if they continue or are bothersome): -diarrhea -fever, chills (flu-like symptoms) -headaches -nausea, vomiting -redness, stinging, or swelling at site where injected This list may not describe all possible side effects. Call your doctor for medical advice about side effects. You may report side effects to FDA at 1-800-FDA-1088. Where should I keep my medicine? Keep out of the reach of children. Store in a refrigerator between 2 and 8 degrees C (36 and 46 degrees F). Do not freeze. Do not shake. Throw away any unused portion if using a single-dose vial. Throw away any unused medicine after the expiration date. NOTE: This sheet is a summary. It may not cover all possible information. If you have questions about this medicine, talk to your doctor, pharmacist, or health care provider.  2015, [employer]. (2008-07-15 10:23:57)  

## 2017-03-08 NOTE — Progress Notes (Signed)
Hemoglobin noted at 8.5 today. Denies difficulty breathing, dizziness, chest pain. States she is "able to do what I normally do without problems."  Denies need for blood transfusion at this time. Pt instructed to call office at once if bleeding, noted, difficulty breathing, chest pain or to seek EMS at once if these problems occur. Pt stated she understood directions.

## 2017-03-16 DIAGNOSIS — N185 Chronic kidney disease, stage 5: Secondary | ICD-10-CM | POA: Diagnosis not present

## 2017-03-16 DIAGNOSIS — N189 Chronic kidney disease, unspecified: Secondary | ICD-10-CM | POA: Diagnosis not present

## 2017-03-16 DIAGNOSIS — M899 Disorder of bone, unspecified: Secondary | ICD-10-CM | POA: Diagnosis not present

## 2017-03-21 DIAGNOSIS — E871 Hypo-osmolality and hyponatremia: Secondary | ICD-10-CM | POA: Diagnosis not present

## 2017-03-21 DIAGNOSIS — E1122 Type 2 diabetes mellitus with diabetic chronic kidney disease: Secondary | ICD-10-CM | POA: Diagnosis not present

## 2017-03-21 DIAGNOSIS — N189 Chronic kidney disease, unspecified: Secondary | ICD-10-CM | POA: Diagnosis not present

## 2017-03-21 DIAGNOSIS — N185 Chronic kidney disease, stage 5: Secondary | ICD-10-CM | POA: Diagnosis not present

## 2017-03-21 DIAGNOSIS — E872 Acidosis: Secondary | ICD-10-CM | POA: Diagnosis not present

## 2017-03-21 DIAGNOSIS — M899 Disorder of bone, unspecified: Secondary | ICD-10-CM | POA: Diagnosis not present

## 2017-03-21 DIAGNOSIS — I120 Hypertensive chronic kidney disease with stage 5 chronic kidney disease or end stage renal disease: Secondary | ICD-10-CM | POA: Diagnosis not present

## 2017-03-21 DIAGNOSIS — D631 Anemia in chronic kidney disease: Secondary | ICD-10-CM | POA: Diagnosis not present

## 2017-03-29 ENCOUNTER — Ambulatory Visit (HOSPITAL_BASED_OUTPATIENT_CLINIC_OR_DEPARTMENT_OTHER): Payer: Medicare HMO

## 2017-03-29 ENCOUNTER — Other Ambulatory Visit (HOSPITAL_BASED_OUTPATIENT_CLINIC_OR_DEPARTMENT_OTHER): Payer: Medicare HMO

## 2017-03-29 VITALS — BP 133/43 | HR 56 | Temp 97.0°F | Resp 20

## 2017-03-29 DIAGNOSIS — D631 Anemia in chronic kidney disease: Secondary | ICD-10-CM | POA: Diagnosis not present

## 2017-03-29 DIAGNOSIS — N189 Chronic kidney disease, unspecified: Secondary | ICD-10-CM | POA: Diagnosis not present

## 2017-03-29 DIAGNOSIS — D696 Thrombocytopenia, unspecified: Secondary | ICD-10-CM

## 2017-03-29 DIAGNOSIS — D509 Iron deficiency anemia, unspecified: Secondary | ICD-10-CM

## 2017-03-29 LAB — CBC WITH DIFFERENTIAL/PLATELET
BASO%: 0.6 % (ref 0.0–2.0)
BASOS ABS: 0 10*3/uL (ref 0.0–0.1)
EOS%: 4 % (ref 0.0–7.0)
Eosinophils Absolute: 0.1 10*3/uL (ref 0.0–0.5)
HEMATOCRIT: 28.5 % — AB (ref 34.8–46.6)
HEMOGLOBIN: 9.1 g/dL — AB (ref 11.6–15.9)
LYMPH#: 1 10*3/uL (ref 0.9–3.3)
LYMPH%: 27.7 % (ref 14.0–49.7)
MCH: 33.2 pg (ref 25.1–34.0)
MCHC: 31.9 g/dL (ref 31.5–36.0)
MCV: 104 fL — ABNORMAL HIGH (ref 79.5–101.0)
MONO#: 0.6 10*3/uL (ref 0.1–0.9)
MONO%: 15.9 % — AB (ref 0.0–14.0)
NEUT%: 51.8 % (ref 38.4–76.8)
NEUTROS ABS: 1.8 10*3/uL (ref 1.5–6.5)
Platelets: 75 10*3/uL — ABNORMAL LOW (ref 145–400)
RBC: 2.74 10*6/uL — ABNORMAL LOW (ref 3.70–5.45)
RDW: 15.9 % — AB (ref 11.2–14.5)
WBC: 3.5 10*3/uL — AB (ref 3.9–10.3)

## 2017-03-29 MED ORDER — DARBEPOETIN ALFA 300 MCG/0.6ML IJ SOSY
300.0000 ug | PREFILLED_SYRINGE | Freq: Once | INTRAMUSCULAR | Status: AC
  Administered 2017-03-29: 300 ug via SUBCUTANEOUS
  Filled 2017-03-29: qty 0.6

## 2017-03-29 NOTE — Patient Instructions (Signed)

## 2017-04-19 ENCOUNTER — Ambulatory Visit (HOSPITAL_BASED_OUTPATIENT_CLINIC_OR_DEPARTMENT_OTHER): Payer: Medicare HMO

## 2017-04-19 ENCOUNTER — Other Ambulatory Visit (HOSPITAL_BASED_OUTPATIENT_CLINIC_OR_DEPARTMENT_OTHER): Payer: Medicare HMO

## 2017-04-19 VITALS — BP 137/51 | HR 59 | Temp 97.9°F | Resp 18

## 2017-04-19 DIAGNOSIS — D500 Iron deficiency anemia secondary to blood loss (chronic): Secondary | ICD-10-CM

## 2017-04-19 DIAGNOSIS — N189 Chronic kidney disease, unspecified: Secondary | ICD-10-CM

## 2017-04-19 DIAGNOSIS — D631 Anemia in chronic kidney disease: Secondary | ICD-10-CM | POA: Diagnosis not present

## 2017-04-19 DIAGNOSIS — D509 Iron deficiency anemia, unspecified: Secondary | ICD-10-CM

## 2017-04-19 DIAGNOSIS — D696 Thrombocytopenia, unspecified: Secondary | ICD-10-CM

## 2017-04-19 LAB — CBC WITH DIFFERENTIAL/PLATELET
BASO%: 0.5 % (ref 0.0–2.0)
BASOS ABS: 0 10*3/uL (ref 0.0–0.1)
EOS ABS: 0.2 10*3/uL (ref 0.0–0.5)
EOS%: 4.5 % (ref 0.0–7.0)
HCT: 30.4 % — ABNORMAL LOW (ref 34.8–46.6)
HGB: 9.5 g/dL — ABNORMAL LOW (ref 11.6–15.9)
LYMPH%: 22.7 % (ref 14.0–49.7)
MCH: 32.9 pg (ref 25.1–34.0)
MCHC: 31.3 g/dL — AB (ref 31.5–36.0)
MCV: 105.2 fL — ABNORMAL HIGH (ref 79.5–101.0)
MONO#: 0.7 10*3/uL (ref 0.1–0.9)
MONO%: 16.5 % — ABNORMAL HIGH (ref 0.0–14.0)
NEUT#: 2.2 10*3/uL (ref 1.5–6.5)
NEUT%: 55.8 % (ref 38.4–76.8)
PLATELETS: 77 10*3/uL — AB (ref 145–400)
RBC: 2.89 10*6/uL — AB (ref 3.70–5.45)
RDW: 15.1 % — ABNORMAL HIGH (ref 11.2–14.5)
WBC: 4 10*3/uL (ref 3.9–10.3)
lymph#: 0.9 10*3/uL (ref 0.9–3.3)

## 2017-04-19 LAB — IRON AND TIBC
%SAT: 19 % — AB (ref 21–57)
IRON: 64 ug/dL (ref 41–142)
TIBC: 341 ug/dL (ref 236–444)
UIBC: 277 ug/dL (ref 120–384)

## 2017-04-19 LAB — FERRITIN: FERRITIN: 33 ng/mL (ref 9–269)

## 2017-04-19 MED ORDER — DARBEPOETIN ALFA 300 MCG/0.6ML IJ SOSY
300.0000 ug | PREFILLED_SYRINGE | Freq: Once | INTRAMUSCULAR | Status: AC
  Administered 2017-04-19: 300 ug via SUBCUTANEOUS
  Filled 2017-04-19: qty 0.6

## 2017-04-19 NOTE — Patient Instructions (Signed)

## 2017-04-25 DIAGNOSIS — R69 Illness, unspecified: Secondary | ICD-10-CM | POA: Diagnosis not present

## 2017-05-03 DIAGNOSIS — N185 Chronic kidney disease, stage 5: Secondary | ICD-10-CM | POA: Diagnosis not present

## 2017-05-09 DIAGNOSIS — E872 Acidosis: Secondary | ICD-10-CM | POA: Diagnosis not present

## 2017-05-09 DIAGNOSIS — E1122 Type 2 diabetes mellitus with diabetic chronic kidney disease: Secondary | ICD-10-CM | POA: Diagnosis not present

## 2017-05-09 DIAGNOSIS — E875 Hyperkalemia: Secondary | ICD-10-CM | POA: Diagnosis not present

## 2017-05-09 DIAGNOSIS — N185 Chronic kidney disease, stage 5: Secondary | ICD-10-CM | POA: Diagnosis not present

## 2017-05-09 DIAGNOSIS — M899 Disorder of bone, unspecified: Secondary | ICD-10-CM | POA: Diagnosis not present

## 2017-05-09 DIAGNOSIS — I120 Hypertensive chronic kidney disease with stage 5 chronic kidney disease or end stage renal disease: Secondary | ICD-10-CM | POA: Diagnosis not present

## 2017-05-10 ENCOUNTER — Other Ambulatory Visit (HOSPITAL_BASED_OUTPATIENT_CLINIC_OR_DEPARTMENT_OTHER): Payer: Medicare HMO

## 2017-05-10 ENCOUNTER — Ambulatory Visit (HOSPITAL_BASED_OUTPATIENT_CLINIC_OR_DEPARTMENT_OTHER): Payer: Medicare HMO

## 2017-05-10 ENCOUNTER — Ambulatory Visit (HOSPITAL_BASED_OUTPATIENT_CLINIC_OR_DEPARTMENT_OTHER): Payer: Medicare HMO | Admitting: Oncology

## 2017-05-10 ENCOUNTER — Telehealth: Payer: Self-pay | Admitting: Oncology

## 2017-05-10 ENCOUNTER — Telehealth: Payer: Self-pay | Admitting: Emergency Medicine

## 2017-05-10 VITALS — BP 161/71 | HR 61 | Temp 97.9°F | Resp 18 | Ht 68.0 in | Wt 126.7 lb

## 2017-05-10 DIAGNOSIS — D649 Anemia, unspecified: Secondary | ICD-10-CM

## 2017-05-10 DIAGNOSIS — N189 Chronic kidney disease, unspecified: Secondary | ICD-10-CM

## 2017-05-10 DIAGNOSIS — D631 Anemia in chronic kidney disease: Secondary | ICD-10-CM | POA: Diagnosis not present

## 2017-05-10 DIAGNOSIS — D696 Thrombocytopenia, unspecified: Secondary | ICD-10-CM

## 2017-05-10 DIAGNOSIS — D509 Iron deficiency anemia, unspecified: Secondary | ICD-10-CM

## 2017-05-10 LAB — CBC WITH DIFFERENTIAL/PLATELET
BASO%: 0.9 % (ref 0.0–2.0)
Basophils Absolute: 0 10*3/uL (ref 0.0–0.1)
EOS ABS: 0.2 10*3/uL (ref 0.0–0.5)
EOS%: 5.3 % (ref 0.0–7.0)
HEMATOCRIT: 32.7 % — AB (ref 34.8–46.6)
HEMOGLOBIN: 10.5 g/dL — AB (ref 11.6–15.9)
LYMPH#: 0.9 10*3/uL (ref 0.9–3.3)
LYMPH%: 20.8 % (ref 14.0–49.7)
MCH: 33.2 pg (ref 25.1–34.0)
MCHC: 32.2 g/dL (ref 31.5–36.0)
MCV: 103.2 fL — ABNORMAL HIGH (ref 79.5–101.0)
MONO#: 0.7 10*3/uL (ref 0.1–0.9)
MONO%: 15.6 % — ABNORMAL HIGH (ref 0.0–14.0)
NEUT%: 57.4 % (ref 38.4–76.8)
NEUTROS ABS: 2.4 10*3/uL (ref 1.5–6.5)
PLATELETS: 91 10*3/uL — AB (ref 145–400)
RBC: 3.17 10*6/uL — ABNORMAL LOW (ref 3.70–5.45)
RDW: 15 % — ABNORMAL HIGH (ref 11.2–14.5)
WBC: 4.2 10*3/uL (ref 3.9–10.3)

## 2017-05-10 MED ORDER — DARBEPOETIN ALFA 300 MCG/0.6ML IJ SOSY
300.0000 ug | PREFILLED_SYRINGE | Freq: Once | INTRAMUSCULAR | Status: AC
  Administered 2017-05-10: 300 ug via SUBCUTANEOUS
  Filled 2017-05-10: qty 0.6

## 2017-05-10 NOTE — Telephone Encounter (Signed)
Md verified that pt can receive Aranesp with bp of 161/71. Pharmacy made aware.

## 2017-05-10 NOTE — Telephone Encounter (Signed)
Gave patient AVS and calendar of upcoming October appointments °

## 2017-05-10 NOTE — Progress Notes (Signed)
Hematology and Oncology Follow Up Visit  [name] [id number] [birth year] 76 y.o. 05/10/2017 9:45 AM [name], MDWilson, Jeanne Flavors, MD   Principle Diagnosis: 76 year old female with anemia of renal disease diagnosed in 2011. She has chronic renal insufficiency but has not required hemodialysis.  Current therapy: Aranesp 300 mcg subcutaneously every 3 weeks keeping hemoglobin above 11.   Interim History:  [name] presents today for a followup visit with her husband. Since the last visit, she reports no major changes in her health. She is no longer reporting any hematochezia or GI bleeding which has completely resolved at this time. Her GI workup including colonoscopy and endoscopy did not show any malignancy. She remains in reasonably stable health without any recurrent hospitalizations. She ambulates with the help of a cane and has not reported any falls. She continues to receive Aranesp without any major complications.   She has not reported any headaches or blurry vision or double vision. She does not report any chest pain shortness of breath or difficulty breathing. She does not report any nausea or vomiting or abdominal pain. She does not report any genitourinary complaints. She has not reported any bleeding or clotting tendencies. She has not reported any lymphadenopathy or petechiae. Rest of her review of systems unremarkable.  Medications: I have reviewed the patient's current medications.  Current Outpatient Prescriptions  Medication Sig Dispense Refill  . ALPRAZolam (XANAX) 0.5 MG tablet Take 0.5 mg by mouth at bedtime.     [name] amLODipine (NORVASC) 5 MG tablet Take 1 tablet (5 mg total) by mouth daily. 90 tablet 3  . carvedilol (COREG) 12.5 MG tablet TAKE 1 TABLET(12.5 MG) BY MOUTH TWICE DAILY 60 tablet 9  . Darbepoetin Alfa (ARANESP) 300 MCG/0.6ML SOSY injection Inject 300 mcg into the skin once.    . furosemide (LASIX) 20 MG tablet Take 20 mg by mouth daily. As directed    .  losartan (COZAAR) 25 MG tablet Take 1 tablet (25 mg total) by mouth daily. 90 tablet 3  . rosuvastatin (CRESTOR) 5 MG tablet TAKE 1 TABLET(5 MG) BY MOUTH DAILY 90 tablet 3  . sevelamer carbonate (RENVELA) 800 MG tablet     . sodium bicarbonate 650 MG tablet Take 650 mg by mouth 2 (two) times daily.    [name] trimethoprim (TRIMPEX) 100 MG tablet Take 50 mg by mouth daily.     . VELTASSA 8.4 G packet Take 8.4 g by mouth daily after lunch.      No current facility-administered medications for this visit.      Allergies:  Allergies  Allergen Reactions  . Lipitor [Atorvastatin] Other (See Comments)    Stomach pain  . Strawberry Extract Rash    Past Medical History, Surgical history, Social history, and Family History were reviewed and updated.    Physical Exam: Blood pressure (!) 161/71, pulse 61, temperature 97.9 F (36.6 C), temperature source Oral, resp. rate 18, height 5\' 8"  (1.727 m), weight 126 lb 11.2 oz (57.5 kg), SpO2 100 %.   ECOG: 1 General appearance: Alert, awake woman appeared without distress. Head: Normocephalic, without obvious abnormality no oral ulcers or lesions. Neck: no adenopathy and no carotid bruit no thyroid masses. Lymph nodes: Cervical, supraclavicular, and axillary nodes normal. Heart:regular rate and rhythm, S1, S2 normal, no murmur, click, rub or gallop Lung:chest clear, no wheezing, rales, normal symmetric air entry.  Abdomen: soft, non-tender, without masses or organomegaly no rebound or guarding. EXT:no erythema, induration, or nodules   Lab  Results: Lab Results  Component Value Date   WBC 4.2 05/10/2017   HGB 10.5 (L) 05/10/2017   HCT 32.7 (L) 05/10/2017   MCV 103.2 (H) 05/10/2017   PLT 91 (L) 05/10/2017     Chemistry      Component Value Date/Time   NA 137 11/03/2016 0332   NA 136 08/03/2016 1006   K 4.1 11/03/2016 0332   K 5.0 08/03/2016 1006   CL 110 11/03/2016 0332   CL 101 08/16/2012 0757   CO2 17 (L) 11/03/2016 0332   CO2 20 (L)  08/03/2016 1006   BUN 50 (H) 11/03/2016 0332   BUN 44.9 (H) 08/03/2016 1006   CREATININE 3.62 (H) 11/03/2016 0332   CREATININE 3.6 (HH) 08/03/2016 1006      Component Value Date/Time   CALCIUM 8.1 (L) 11/03/2016 0332   CALCIUM 9.3 08/03/2016 1006   ALKPHOS 31 (L) 10/31/2016 1159   ALKPHOS 66 08/03/2016 1006   AST 14 (L) 10/31/2016 1159   AST 19 08/03/2016 1006   ALT 11 (L) 10/31/2016 1159   ALT 15 08/03/2016 1006   BILITOT 0.2 (L) 10/31/2016 1159   BILITOT 0.43 08/03/2016 1006     Results for Jeanne, [name] (MRN [id number]) as of 05/10/2017 09:35  Ref. Range 04/19/2017 09:11  Iron Latest Ref Range: 41 - 142 ug/dL 64  UIBC Latest Ref Range: 120 - 384 ug/dL 277  TIBC Latest Ref Range: 236 - 444 ug/dL 341  %SAT Latest Ref Range: 21 - 57 % 19 (L)  Ferritin Latest Ref Range: 9 - 269 ng/ml 33      Impression and Plan:   76 year old female with the following issues:   1. Anemia of renal disease. She is currently on Aranesp 300 g every 3 weeks to keep her hemoglobin above 11.   Her iron studies obtained on 04/19/2017 showed no evidence of iron deficiency that required supplementation.  Her hemoglobin today is 10.5 and will require Aranesp therapy. The plan is to continue with this every 3 weeks to keep her hemoglobin above 11.  2. 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 Platelet count remained adequate without any intervention at this time.  3. Renal failure: Her creatinine clearance is close to 10 mL/m but have not received dialysis at this time.  4. GI bleeding: This has resolved at this time. No signs or symptoms of bleeding noted.  5. Follow-up: CBC and Aranesp injection every 3 weeks. I will repeat iron studies in January 2019. She'll have an M.D. follow-up in January 2019.      [name] 9/26/20189:45 AM

## 2017-05-11 DIAGNOSIS — R69 Illness, unspecified: Secondary | ICD-10-CM | POA: Diagnosis not present

## 2017-05-11 DIAGNOSIS — E119 Type 2 diabetes mellitus without complications: Secondary | ICD-10-CM | POA: Diagnosis not present

## 2017-05-11 DIAGNOSIS — K5903 Drug induced constipation: Secondary | ICD-10-CM | POA: Diagnosis not present

## 2017-05-11 DIAGNOSIS — I10 Essential (primary) hypertension: Secondary | ICD-10-CM | POA: Diagnosis not present

## 2017-05-29 ENCOUNTER — Ambulatory Visit: Payer: Medicare HMO | Admitting: Nurse Practitioner

## 2017-05-31 ENCOUNTER — Other Ambulatory Visit (HOSPITAL_BASED_OUTPATIENT_CLINIC_OR_DEPARTMENT_OTHER): Payer: Medicare HMO

## 2017-05-31 ENCOUNTER — Ambulatory Visit (HOSPITAL_BASED_OUTPATIENT_CLINIC_OR_DEPARTMENT_OTHER): Payer: Medicare HMO

## 2017-05-31 VITALS — BP 125/71 | HR 58 | Temp 97.8°F | Resp 18

## 2017-05-31 DIAGNOSIS — N189 Chronic kidney disease, unspecified: Secondary | ICD-10-CM

## 2017-05-31 DIAGNOSIS — D696 Thrombocytopenia, unspecified: Secondary | ICD-10-CM

## 2017-05-31 DIAGNOSIS — D631 Anemia in chronic kidney disease: Secondary | ICD-10-CM

## 2017-05-31 DIAGNOSIS — D509 Iron deficiency anemia, unspecified: Secondary | ICD-10-CM

## 2017-05-31 LAB — CBC WITH DIFFERENTIAL/PLATELET
BASO%: 0.5 % (ref 0.0–2.0)
Basophils Absolute: 0 10*3/uL (ref 0.0–0.1)
EOS%: 4.2 % (ref 0.0–7.0)
Eosinophils Absolute: 0.2 10*3/uL (ref 0.0–0.5)
HCT: 30.8 % — ABNORMAL LOW (ref 34.8–46.6)
HGB: 9.6 g/dL — ABNORMAL LOW (ref 11.6–15.9)
LYMPH%: 31 % (ref 14.0–49.7)
MCH: 32.4 pg (ref 25.1–34.0)
MCHC: 31.2 g/dL — AB (ref 31.5–36.0)
MCV: 104.1 fL — AB (ref 79.5–101.0)
MONO#: 0.5 10*3/uL (ref 0.1–0.9)
MONO%: 12.2 % (ref 0.0–14.0)
NEUT#: 2 10*3/uL (ref 1.5–6.5)
NEUT%: 52.1 % (ref 38.4–76.8)
PLATELETS: 73 10*3/uL — AB (ref 145–400)
RBC: 2.96 10*6/uL — AB (ref 3.70–5.45)
RDW: 14.3 % (ref 11.2–14.5)
WBC: 3.8 10*3/uL — AB (ref 3.9–10.3)
lymph#: 1.2 10*3/uL (ref 0.9–3.3)

## 2017-05-31 MED ORDER — DARBEPOETIN ALFA 300 MCG/0.6ML IJ SOSY
300.0000 ug | PREFILLED_SYRINGE | Freq: Once | INTRAMUSCULAR | Status: AC
  Administered 2017-05-31: 300 ug via SUBCUTANEOUS
  Filled 2017-05-31: qty 0.6

## 2017-05-31 NOTE — Patient Instructions (Signed)

## 2017-06-16 DIAGNOSIS — N185 Chronic kidney disease, stage 5: Secondary | ICD-10-CM | POA: Diagnosis not present

## 2017-06-21 ENCOUNTER — Other Ambulatory Visit (HOSPITAL_BASED_OUTPATIENT_CLINIC_OR_DEPARTMENT_OTHER): Payer: Medicare HMO

## 2017-06-21 ENCOUNTER — Ambulatory Visit (HOSPITAL_BASED_OUTPATIENT_CLINIC_OR_DEPARTMENT_OTHER): Payer: Medicare HMO

## 2017-06-21 VITALS — BP 114/46 | HR 55 | Temp 97.3°F | Resp 18

## 2017-06-21 DIAGNOSIS — D631 Anemia in chronic kidney disease: Secondary | ICD-10-CM

## 2017-06-21 DIAGNOSIS — N189 Chronic kidney disease, unspecified: Secondary | ICD-10-CM | POA: Diagnosis not present

## 2017-06-21 DIAGNOSIS — D696 Thrombocytopenia, unspecified: Secondary | ICD-10-CM

## 2017-06-21 LAB — CBC WITH DIFFERENTIAL/PLATELET
BASO%: 1.1 % (ref 0.0–2.0)
Basophils Absolute: 0 10*3/uL (ref 0.0–0.1)
EOS ABS: 0.2 10*3/uL (ref 0.0–0.5)
EOS%: 4.1 % (ref 0.0–7.0)
HEMATOCRIT: 31.9 % — AB (ref 34.8–46.6)
HGB: 10.1 g/dL — ABNORMAL LOW (ref 11.6–15.9)
LYMPH#: 0.9 10*3/uL (ref 0.9–3.3)
LYMPH%: 20.5 % (ref 14.0–49.7)
MCH: 32.2 pg (ref 25.1–34.0)
MCHC: 31.7 g/dL (ref 31.5–36.0)
MCV: 101.5 fL — AB (ref 79.5–101.0)
MONO#: 0.5 10*3/uL (ref 0.1–0.9)
MONO%: 12.7 % (ref 0.0–14.0)
NEUT%: 61.6 % (ref 38.4–76.8)
NEUTROS ABS: 2.6 10*3/uL (ref 1.5–6.5)
PLATELETS: 85 10*3/uL — AB (ref 145–400)
RBC: 3.14 10*6/uL — ABNORMAL LOW (ref 3.70–5.45)
RDW: 14.8 % — ABNORMAL HIGH (ref 11.2–14.5)
WBC: 4.2 10*3/uL (ref 3.9–10.3)

## 2017-06-21 LAB — IRON AND TIBC
%SAT: 22 % (ref 21–57)
Iron: 82 ug/dL (ref 41–142)
TIBC: 375 ug/dL (ref 236–444)
UIBC: 293 ug/dL (ref 120–384)

## 2017-06-21 LAB — FERRITIN: Ferritin: 15 ng/ml (ref 9–269)

## 2017-06-21 MED ORDER — DARBEPOETIN ALFA 300 MCG/0.6ML IJ SOSY
300.0000 ug | PREFILLED_SYRINGE | Freq: Once | INTRAMUSCULAR | Status: AC
  Administered 2017-06-21: 300 ug via SUBCUTANEOUS
  Filled 2017-06-21: qty 0.6

## 2017-06-21 NOTE — Patient Instructions (Signed)

## 2017-06-22 DIAGNOSIS — M899 Disorder of bone, unspecified: Secondary | ICD-10-CM | POA: Diagnosis not present

## 2017-06-22 DIAGNOSIS — E875 Hyperkalemia: Secondary | ICD-10-CM | POA: Diagnosis not present

## 2017-06-22 DIAGNOSIS — E1122 Type 2 diabetes mellitus with diabetic chronic kidney disease: Secondary | ICD-10-CM | POA: Diagnosis not present

## 2017-06-22 DIAGNOSIS — E872 Acidosis: Secondary | ICD-10-CM | POA: Diagnosis not present

## 2017-06-22 DIAGNOSIS — I120 Hypertensive chronic kidney disease with stage 5 chronic kidney disease or end stage renal disease: Secondary | ICD-10-CM | POA: Diagnosis not present

## 2017-06-22 DIAGNOSIS — D631 Anemia in chronic kidney disease: Secondary | ICD-10-CM | POA: Diagnosis not present

## 2017-06-22 DIAGNOSIS — N185 Chronic kidney disease, stage 5: Secondary | ICD-10-CM | POA: Diagnosis not present

## 2017-06-27 ENCOUNTER — Ambulatory Visit: Payer: Medicare HMO | Admitting: Nurse Practitioner

## 2017-06-27 ENCOUNTER — Encounter: Payer: Self-pay | Admitting: Nurse Practitioner

## 2017-06-27 VITALS — BP 112/60 | HR 60 | Ht 68.0 in | Wt 127.8 lb

## 2017-06-27 DIAGNOSIS — I10 Essential (primary) hypertension: Secondary | ICD-10-CM | POA: Diagnosis not present

## 2017-06-27 DIAGNOSIS — I259 Chronic ischemic heart disease, unspecified: Secondary | ICD-10-CM | POA: Diagnosis not present

## 2017-06-27 DIAGNOSIS — E785 Hyperlipidemia, unspecified: Secondary | ICD-10-CM | POA: Diagnosis not present

## 2017-06-27 DIAGNOSIS — I350 Nonrheumatic aortic (valve) stenosis: Secondary | ICD-10-CM

## 2017-06-27 DIAGNOSIS — E7800 Pure hypercholesterolemia, unspecified: Secondary | ICD-10-CM

## 2017-06-27 MED ORDER — ROSUVASTATIN CALCIUM 5 MG PO TABS: ORAL_TABLET | ORAL | 3 refills | Status: DC

## 2017-06-27 NOTE — Patient Instructions (Addendum)
We will be checking the following labs today - NONE  I will ask Dr. Kathryne Eriksson to send me a copy of your last lab.    Medication Instructions:    Continue with your current medicines.  I sent in your refill for Crestor today     Testing/Procedures To Be Arranged:  N/A  Follow-Up:   See me in 6 months.     Other Special Instructions:   N/A    If you need a refill on your cardiac medications before your next appointment, please call your pharmacy.   Call the [place] office at [phone] if you have any questions, problems or concerns.

## 2017-06-27 NOTE — Progress Notes (Signed)
CARDIOLOGY OFFICE NOTE  Date:  06/27/2017    [name] Date of Birth: April 21, 1941 Medical Record #[id number]  PCP:  [name], MD  Cardiologist:  [name] (former Aundra Dubin)  Chief Complaint  Patient presents with  . Coronary Artery Disease    5 month check     History of Present Illness: [name] is a 76 y.o. female who presents today for a follow up visit. Seen for Dr. Aundra Dubin.   She has a h/o CAD s/p CABG in 4332 and diastolic CHF. She has chronic anemia, balance issues, HTN, DM, HLD, CKD, chronic back pain, anxiety and known valvular heart disease.   I have seen her several times over the last few years. She is followed by Nephrology in [place] - has seen Dr. Kellie Simmering for AV fistula consult. Kidneys arefailing and she isnearing dialysis.   Seen back in Julyof 2017 - feeling ok. EKG with more changes. Updated her Myoview - see below. Brought back for discussion - opted to avoid cardiac catheterization and continue with medical management. She has had no desire to go on dialysis.  I saw her back in March - she had been in the hospital a few times since I had last seen.  Had had a mild stroke the month prior - noted to have right thalamic small infarct due to small vessel disease - carotids with 40 to 59% stenosis, Echo with normal EF, had her dose of aspirin increased to 325 mg. Then she had some major spontaneously resolving GI bleeding and had to have colonoscopy. Small erosions noted. Negative EGD. She required transfusion with total of 6 units of blood. Aspirin held until seen back by GI. She was feeling better clinically. No chest pain noted. Overall prognosis felt to be quite tenuous.  Last seen by me back in June - had been released from GI. Remained anemic. Cardiac status fortunately remains stable. She has remained off aspirin therapy.   Comes in today. Here with her husband. She is doing ok. No chest pain. Her DOE is stable. No syncope. She feels like she  is holding her own. Still making good volume of urine. Labs followed closely. She feels like she is doing ok and has no real concerns. He feels like she is doing well also.  No recent admissions.   Past Medical History:  1. CAD: Pt presented 2/10 to [facility] with NSTEMI and diastolic CHF exacerbation. LHC was done 3/10 showing 99% pRCA stenosis and 80% calcified pLAD stenosis with L=>R collaterals. Pt was referred for CABG which was done by Dr. Prescott Gum with LIMA-LAD, SVG-RCA, SVG-OM.  2. Diastolic CHF: Echo (9/51) showed EF 55-65%, mild LVH, diastolic dysfunction, mild AS with mean gradient 12 mmHg, PASP 43 mmHg. Echo (2/12): EF 55-60%, mild LVH, mild AS (mean gradient 12), PA systolic pressure 32 mmHg. Echo (5/15) with EF 55%, mild MR, mild aortic stenosis.  3. CKD 4. DM2  5. HTN  6. Hyperlipidemia: nausea/GI discomfort with atorvastatin, ? Balance problems with pravastatin.  7. Anxiety  8. Chronic low back pain  9. History of thrombocytopenia  10. 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 Followed by hematology, getting Aranesp injections 11. Aortic stenosis: mean gradient 12 mmHg in 2/12. Mean gradient 18 mmHg with AVA 1.6 cm^2 in 5/15.  12. Carotid stenosis: 40-59% bilateral ICA stenosis in 8/84. 16-60% LICA stenosis in 6/30. Carotids (1/60) with 10-93% LICA stenosis.  13. PAD: ABIs 3/12 were normal but  TBIs were mildly decreased. ABIs/TBIs (10/13) normal.    Past Medical History:  Diagnosis Date  . Anemia    Pt is taking iron.   [name] Anxiety   . Arthritis   . Carotid stenosis    40-59% bilateral ICA stenosis in 2/12.  . Chronic low back pain   . CKD (chronic kidney disease)    Dr. Audie Clear at [facility]  . Coronary artery disease    Pt presented 2/10 to [facility] with NSTEMI and diastolic CHF exacerbation.  LHC was done  3/10 showing 99% pRCA stenosis and 80% calcified pLAD stenosis with L=>R collaterals.  Pt was referred  for CABG which was  done by Dr. Prescott Gum with LIMA-LAD, SVG-RCA, SVG-OM.  . Diabetes mellitus   . Diabetic neuropathy ([place])   . Diastolic CHF ([facility])    Echo (2/10) showed EF 55-65%, mild LVH, diastolic dysfunction, mild AS with mean gradient 12 mmHg, PASP 43 mmHg.  Echo (2/12): EF 55-60%, mild LVH, mild AS (mean gradient 12), PA systolic pressure 32 mmHg.     [name] GERD (gastroesophageal reflux disease)   . Heart murmur   . Hyperlipidemia   . Hypertension   . Mild aortic stenosis    mean gradient 12 mmHg in 2/12.  . Myocardial infarction ([place])    "mild"  . Pneumonia   . PONV (postoperative nausea and vomiting)   . Thrombocytopenia ([place])   . Unsteady gait     Past Surgical History:  Procedure Laterality Date  . BACK SURGERY     multiple  . BREAST SURGERY     biopsy  . CARDIAC CATHETERIZATION    . CATARACT EXTRACTION W/ INTRAOCULAR LENS  IMPLANT, BILATERAL    . COLONOSCOPY W/ BIOPSIES AND POLYPECTOMY    . CORONARY ARTERY BYPASS GRAFT  09/2008   pt with NSTEMI and diastolic CHF exacerbation.  LHC was done  3/10 showing 99% pRCA stenosis and 80% calcified pLAD stenosis with L=>R collaterals.  Pt was referred  for CABG which was done by Dr. Prescott Gum with LIMA-LAD, SVG-RCA, SVG-OM.     Medications: Current Meds  Medication Sig  . ALPRAZolam (XANAX) 0.5 MG tablet Take 0.5 mg by mouth at bedtime.   . AMITIZA 24 MCG capsule   . amLODipine (NORVASC) 5 MG tablet Take 1 tablet (5 mg total) by mouth daily.  . carvedilol (COREG) 12.5 MG tablet TAKE 1 TABLET(12.5 MG) BY MOUTH TWICE DAILY  . Darbepoetin Alfa (ARANESP) 300 MCG/0.6ML SOSY injection Inject 300 mcg into the skin once.  . furosemide (LASIX) 20 MG tablet Take 20 mg by mouth daily. As directed  . losartan (COZAAR) 25 MG tablet Take 1 tablet (25 mg total) by mouth daily.  . rosuvastatin (CRESTOR) 5 MG tablet TAKE 1 TABLET(5 MG) BY MOUTH DAILY  . sevelamer carbonate (RENVELA) 800 MG tablet Take 1,600 mg 3 (three) times daily with meals by mouth.   .  sodium bicarbonate 650 MG tablet Take 650 mg by mouth 2 (two) times daily.  [name] trimethoprim (TRIMPEX) 100 MG tablet Take 50 mg by mouth daily.   . VELTASSA 8.4 G packet Take 8.4 g by mouth daily after lunch.   . [DISCONTINUED] rosuvastatin (CRESTOR) 5 MG tablet TAKE 1 TABLET(5 MG) BY MOUTH DAILY     Allergies: Allergies  Allergen Reactions  . Lipitor [Atorvastatin] Other (See Comments)    Stomach pain  . Strawberry Extract Rash    Social History: The patient  reports that she has  quit smoking. Her smoking use included cigarettes. she has never used smokeless tobacco. She reports that she drinks alcohol. She reports that she does not use drugs.   Family History: The patient's She was adopted. Family history is unknown by patient.   Review of Systems: Please see the history of present illness.   Otherwise, the review of systems is positive for none.   All other systems are reviewed and negative.   Physical Exam: VS:  BP 112/60 (BP Location: Right Arm, Patient Position: Sitting, Cuff Size: Normal)   Pulse 60   Ht 5\' 8"  (1.727 m)   Wt 127 lb 12.8 oz (58 kg)   BMI 19.43 kg/m  .  BMI Body mass index is 19.43 kg/m.  Wt Readings from Last 3 Encounters:  06/27/17 127 lb 12.8 oz (58 kg)  05/10/17 126 lb 11.2 oz (57.5 kg)  02/07/17 126 lb 1.9 oz (57.2 kg)    General: Pleasant. She looks better today - stronger - remains alert and in no acute distress.   HEENT: Normal.  Neck: Supple, no JVD, carotid bruits, or masses noted.  Cardiac: Regular rate and rhythm. Outflow murmur noted. No edema.  Respiratory:  Lungs are clear to auscultation bilaterally with normal work of breathing.  GI: Soft and nontender.  MS: No deformity or atrophy. Gait and ROM intact.  Skin: Warm and dry. Color is pale - this is chronic Neuro:  Strength and sensation are intact and no gross focal deficits noted.  Psych: Alert, appropriate and with normal affect.   LABORATORY DATA:  EKG:  EKG is not ordered  today.  Lab Results  Component Value Date   WBC 4.2 06/21/2017   HGB 10.1 (L) 06/21/2017   HCT 31.9 (L) 06/21/2017   PLT 85 (L) 06/21/2017   GLUCOSE 112 (H) 11/03/2016   CHOL 99 09/27/2016   TRIG 84 09/27/2016   HDL 51 09/27/2016   LDLDIRECT 34.4 11/17/2008   LDLCALC 31 09/27/2016   ALT 11 (L) 10/31/2016   AST 14 (L) 10/31/2016   NA 137 11/03/2016   K 4.1 11/03/2016   CL 110 11/03/2016   CREATININE 3.62 (H) 11/03/2016   BUN 50 (H) 11/03/2016   CO2 17 (L) 11/03/2016   TSH 2.185 01/17/2014   INR 1.4 10/16/2008   HGBA1C 5.4 09/27/2016     BNP (last 3 results) No results for input(s): BNP in the last 8760 hours.  ProBNP (last 3 results) No results for input(s): PROBNP in the last 8760 hours.   Other Studies Reviewed Today:  Myoview Study Highlights 02/2016    Nuclear stress EF: 54%. There is mid inferior wall hypokinesis  There was no ST segment deviation noted during stress.  Defect 1: There is a medium defect of moderate severity present in the basal inferior and mid inferior location.  The mid inferior wall perfusion defect is reversible consistent with ischemia. The basal inferior wall defect is mostly fixed consistent with infarct.  This is an intermediate risk study. Consider occlusion of SVG to RCA graft.  Candee Furbish, MD   Echo Study Conclusions from 09/2016  - Left ventricle: The cavity size was normal. There was moderate focal basal and mild concentric hypertrophy. Systolic function was normal. The estimated ejection fraction was in the range of 55% to 60%. Wall motion was normal; there were no regional wall motion abnormalities. Features are consistent with a pseudonormal left ventricular filling pattern, with concomitant abnormal relaxation and increased filling pressure (grade 2 diastolic dysfunction). -  Aortic valve: The AV appears mildly to moderately stenotic by doppler with a mean AV gradient of 63mmHg. The calculated AVA  is 0.9cm2 and likely underestimates with AVA due to inaccurate measurment of the LVOT. Visually there appears to be mild to moderate AS. Valve area (VTI): 0.9 cm^2. Valve area (Vmax): 0.89 cm^2. Valve area (Vmean): 0.86 cm^2. - Mitral valve: There was trivial regurgitation. - Right ventricle: Systolic function was mildly reduced. - Pulmonic valve: There was mild regurgitation. - Pulmonary arteries: PA peak pressure: 33 mm Hg (S).   Carotid Doppler Summary: Findings consistent with a high end 1- 39 percent stenosis involving the right internal carotid artery and the left internal carotid artery. Can not rule out higher grade stenosis due to severe amount of shadowing plaque in bilateral carotid arteries and difficulty visualizing vessels. Severe amount of plaque are subjectively suggestive of a 40-59% percent stenosis bilaterally. The right vertebral demonstrates atypical flow, left vertebral is patent and antegrade.  Other specific details can be found in the table(s) above. Prepared and Electronically Authenticated by  Antony Contras MD 2018-02-14T09:22:41   Assessment/Plan:   1. CAD - Status post CABG from 2010. Has had prior intermediate Myoview - reviewed previouslywith Dr. Aundra Dubin. She continues to not have any active chest pain. Her DOE is stable.  Will continue with her current medical regimen for now. At some point she will be on dialysis and then we may need to proceed with left and right heart cath. She does look better today but her overall prognosis still looks quite tenuous to me.  2. AORTIC STENOSIS  No symptoms - last echo noted - no cardinal symptoms. May consider repeating in 2019.   3. Prior stroke - tried to be on higher dose aspirin - this led to major GI bleeding. She remains anemic. Neurologically looks stable.   4. HYPERTENSION - BP okon current regimen. No change with her current regimen.   5. Hyperlipidemia - on statin therapy. needs  refill today - need to see lipids from PCP.   6. Progressive CKD - followed by Renal in [place]  7. Prior GI bleed:  EGD 3/21 by Dr. Collene Mares negative. Subsequent colonoscopy w/diverticulosis and afew small ulcers/erosions in the sigmoid and ascending colon. She remains off of aspirin. Not wanting to restart. No active bleeding noted at this time.   Current medicines are reviewed with the patient today.  The patient does not have concerns regarding medicines other than what has been noted above.  The following changes have been made:  See above.  Labs/ tests ordered today include:   No orders of the defined types were placed in this encounter.    Disposition:   FU with me in 6 months.   Patient is agreeable to this plan and will call if any problems develop in the interim.   SignedTruitt Merle, NP  06/27/2017 8:57 AM  [place] [address] [place], [place]  [address] Phone: [phone] Fax: [phone]

## 2017-07-12 ENCOUNTER — Ambulatory Visit (HOSPITAL_BASED_OUTPATIENT_CLINIC_OR_DEPARTMENT_OTHER): Payer: Medicare HMO

## 2017-07-12 ENCOUNTER — Other Ambulatory Visit (HOSPITAL_BASED_OUTPATIENT_CLINIC_OR_DEPARTMENT_OTHER): Payer: Medicare HMO

## 2017-07-12 VITALS — BP 131/48 | HR 62 | Temp 98.2°F | Resp 18

## 2017-07-12 DIAGNOSIS — N189 Chronic kidney disease, unspecified: Secondary | ICD-10-CM

## 2017-07-12 DIAGNOSIS — D631 Anemia in chronic kidney disease: Secondary | ICD-10-CM

## 2017-07-12 DIAGNOSIS — D696 Thrombocytopenia, unspecified: Secondary | ICD-10-CM

## 2017-07-12 DIAGNOSIS — D509 Iron deficiency anemia, unspecified: Secondary | ICD-10-CM

## 2017-07-12 LAB — CBC WITH DIFFERENTIAL/PLATELET
BASO%: 1.1 % (ref 0.0–2.0)
Basophils Absolute: 0 10*3/uL (ref 0.0–0.1)
EOS ABS: 0.2 10*3/uL (ref 0.0–0.5)
EOS%: 4.1 % (ref 0.0–7.0)
HCT: 30.6 % — ABNORMAL LOW (ref 34.8–46.6)
HEMOGLOBIN: 9.6 g/dL — AB (ref 11.6–15.9)
LYMPH%: 24.5 % (ref 14.0–49.7)
MCH: 31.6 pg (ref 25.1–34.0)
MCHC: 31.4 g/dL — ABNORMAL LOW (ref 31.5–36.0)
MCV: 100.7 fL (ref 79.5–101.0)
MONO#: 0.5 10*3/uL (ref 0.1–0.9)
MONO%: 14.1 % — ABNORMAL HIGH (ref 0.0–14.0)
NEUT%: 56.2 % (ref 38.4–76.8)
NEUTROS ABS: 2.1 10*3/uL (ref 1.5–6.5)
Platelets: 78 10*3/uL — ABNORMAL LOW (ref 145–400)
RBC: 3.04 10*6/uL — ABNORMAL LOW (ref 3.70–5.45)
RDW: 14.9 % — ABNORMAL HIGH (ref 11.2–14.5)
WBC: 3.8 10*3/uL — ABNORMAL LOW (ref 3.9–10.3)
lymph#: 0.9 10*3/uL (ref 0.9–3.3)

## 2017-07-12 MED ORDER — DARBEPOETIN ALFA 300 MCG/0.6ML IJ SOSY
300.0000 ug | PREFILLED_SYRINGE | Freq: Once | INTRAMUSCULAR | Status: AC
  Administered 2017-07-12: 300 ug via SUBCUTANEOUS
  Filled 2017-07-12: qty 0.6

## 2017-07-12 NOTE — Patient Instructions (Signed)

## 2017-07-18 DIAGNOSIS — H401232 Low-tension glaucoma, bilateral, moderate stage: Secondary | ICD-10-CM | POA: Diagnosis not present

## 2017-07-18 DIAGNOSIS — E113593 Type 2 diabetes mellitus with proliferative diabetic retinopathy without macular edema, bilateral: Secondary | ICD-10-CM | POA: Diagnosis not present

## 2017-07-31 DIAGNOSIS — N189 Chronic kidney disease, unspecified: Secondary | ICD-10-CM | POA: Diagnosis not present

## 2017-07-31 DIAGNOSIS — N185 Chronic kidney disease, stage 5: Secondary | ICD-10-CM | POA: Diagnosis not present

## 2017-07-31 DIAGNOSIS — M899 Disorder of bone, unspecified: Secondary | ICD-10-CM | POA: Diagnosis not present

## 2017-08-02 ENCOUNTER — Other Ambulatory Visit (HOSPITAL_BASED_OUTPATIENT_CLINIC_OR_DEPARTMENT_OTHER): Payer: Medicare HMO

## 2017-08-02 ENCOUNTER — Ambulatory Visit (HOSPITAL_BASED_OUTPATIENT_CLINIC_OR_DEPARTMENT_OTHER): Payer: Medicare HMO

## 2017-08-02 ENCOUNTER — Encounter: Payer: Self-pay | Admitting: *Deleted

## 2017-08-02 VITALS — BP 125/64 | HR 57 | Temp 97.9°F | Resp 20

## 2017-08-02 DIAGNOSIS — D631 Anemia in chronic kidney disease: Secondary | ICD-10-CM | POA: Diagnosis not present

## 2017-08-02 DIAGNOSIS — N189 Chronic kidney disease, unspecified: Secondary | ICD-10-CM

## 2017-08-02 DIAGNOSIS — D696 Thrombocytopenia, unspecified: Secondary | ICD-10-CM

## 2017-08-02 DIAGNOSIS — D509 Iron deficiency anemia, unspecified: Secondary | ICD-10-CM

## 2017-08-02 LAB — CBC WITH DIFFERENTIAL/PLATELET
BASO%: 0.4 % (ref 0.0–2.0)
Basophils Absolute: 0 10*3/uL (ref 0.0–0.1)
EOS%: 2.8 % (ref 0.0–7.0)
Eosinophils Absolute: 0.1 10*3/uL (ref 0.0–0.5)
HCT: 31.1 % — ABNORMAL LOW (ref 34.8–46.6)
HEMOGLOBIN: 9.8 g/dL — AB (ref 11.6–15.9)
LYMPH#: 1.3 10*3/uL (ref 0.9–3.3)
LYMPH%: 27.3 % (ref 14.0–49.7)
MCH: 31.1 pg (ref 25.1–34.0)
MCHC: 31.5 g/dL (ref 31.5–36.0)
MCV: 98.7 fL (ref 79.5–101.0)
MONO#: 0.6 10*3/uL (ref 0.1–0.9)
MONO%: 12.7 % (ref 0.0–14.0)
NEUT#: 2.7 10*3/uL (ref 1.5–6.5)
NEUT%: 56.8 % (ref 38.4–76.8)
Platelets: 84 10*3/uL — ABNORMAL LOW (ref 145–400)
RBC: 3.15 10*6/uL — AB (ref 3.70–5.45)
RDW: 15.4 % — ABNORMAL HIGH (ref 11.2–14.5)
WBC: 4.7 10*3/uL (ref 3.9–10.3)
nRBC: 0 % (ref 0–0)

## 2017-08-02 MED ORDER — DARBEPOETIN ALFA 300 MCG/0.6ML IJ SOSY
300.0000 ug | PREFILLED_SYRINGE | Freq: Once | INTRAMUSCULAR | Status: AC
  Administered 2017-08-02: 300 ug via SUBCUTANEOUS

## 2017-08-02 NOTE — Patient Instructions (Signed)

## 2017-08-03 DIAGNOSIS — D631 Anemia in chronic kidney disease: Secondary | ICD-10-CM | POA: Diagnosis not present

## 2017-08-03 DIAGNOSIS — E872 Acidosis: Secondary | ICD-10-CM | POA: Diagnosis not present

## 2017-08-03 DIAGNOSIS — E875 Hyperkalemia: Secondary | ICD-10-CM | POA: Diagnosis not present

## 2017-08-03 DIAGNOSIS — N185 Chronic kidney disease, stage 5: Secondary | ICD-10-CM | POA: Diagnosis not present

## 2017-08-03 DIAGNOSIS — I120 Hypertensive chronic kidney disease with stage 5 chronic kidney disease or end stage renal disease: Secondary | ICD-10-CM | POA: Diagnosis not present

## 2017-08-03 DIAGNOSIS — N189 Chronic kidney disease, unspecified: Secondary | ICD-10-CM | POA: Diagnosis not present

## 2017-08-03 DIAGNOSIS — E1122 Type 2 diabetes mellitus with diabetic chronic kidney disease: Secondary | ICD-10-CM | POA: Diagnosis not present

## 2017-08-03 DIAGNOSIS — M899 Disorder of bone, unspecified: Secondary | ICD-10-CM | POA: Diagnosis not present

## 2017-08-16 DIAGNOSIS — E113593 Type 2 diabetes mellitus with proliferative diabetic retinopathy without macular edema, bilateral: Secondary | ICD-10-CM | POA: Diagnosis not present

## 2017-08-23 ENCOUNTER — Inpatient Hospital Stay: Payer: Medicare HMO

## 2017-08-23 ENCOUNTER — Inpatient Hospital Stay: Payer: Medicare HMO | Attending: Oncology

## 2017-08-23 VITALS — BP 136/74 | HR 61 | Temp 97.9°F | Resp 18

## 2017-08-23 DIAGNOSIS — D631 Anemia in chronic kidney disease: Secondary | ICD-10-CM | POA: Diagnosis present

## 2017-08-23 DIAGNOSIS — D696 Thrombocytopenia, unspecified: Secondary | ICD-10-CM | POA: Diagnosis not present

## 2017-08-23 DIAGNOSIS — I129 Hypertensive chronic kidney disease with stage 1 through stage 4 chronic kidney disease, or unspecified chronic kidney disease: Secondary | ICD-10-CM | POA: Diagnosis present

## 2017-08-23 DIAGNOSIS — D509 Iron deficiency anemia, unspecified: Secondary | ICD-10-CM

## 2017-08-23 DIAGNOSIS — E611 Iron deficiency: Secondary | ICD-10-CM | POA: Insufficient documentation

## 2017-08-23 DIAGNOSIS — N189 Chronic kidney disease, unspecified: Secondary | ICD-10-CM | POA: Diagnosis not present

## 2017-08-23 LAB — CBC WITH DIFFERENTIAL/PLATELET
ABS GRANULOCYTE: 2.1 10*3/uL (ref 1.5–6.5)
BASOS ABS: 0 10*3/uL (ref 0.0–0.1)
BASOS PCT: 0 %
EOS PCT: 3 %
Eosinophils Absolute: 0.1 10*3/uL (ref 0.0–0.5)
HCT: 30.9 % — ABNORMAL LOW (ref 34.8–46.6)
Hemoglobin: 9.3 g/dL — ABNORMAL LOW (ref 11.6–15.9)
Lymphocytes Relative: 27 %
Lymphs Abs: 1 10*3/uL (ref 0.9–3.3)
MCH: 30.8 pg (ref 25.1–34.0)
MCHC: 30.1 g/dL — ABNORMAL LOW (ref 31.5–36.0)
MCV: 102.3 fL — ABNORMAL HIGH (ref 79.5–101.0)
Monocytes Absolute: 0.6 10*3/uL (ref 0.1–0.9)
Monocytes Relative: 16 %
NEUTROS PCT: 54 %
Neutro Abs: 2.1 10*3/uL (ref 1.5–6.5)
PLATELETS: 78 10*3/uL — AB (ref 145–400)
RBC: 3.02 MIL/uL — AB (ref 3.70–5.45)
RDW: 15.2 % (ref 11.2–16.1)
WBC: 3.9 10*3/uL (ref 3.9–10.3)

## 2017-08-23 MED ORDER — DARBEPOETIN ALFA 300 MCG/0.6ML IJ SOSY
300.0000 ug | PREFILLED_SYRINGE | Freq: Once | INTRAMUSCULAR | Status: AC
  Administered 2017-08-23: 300 ug via SUBCUTANEOUS

## 2017-08-23 NOTE — Patient Instructions (Signed)

## 2017-08-31 DIAGNOSIS — N185 Chronic kidney disease, stage 5: Secondary | ICD-10-CM | POA: Diagnosis not present

## 2017-09-05 DIAGNOSIS — N185 Chronic kidney disease, stage 5: Secondary | ICD-10-CM | POA: Diagnosis not present

## 2017-09-05 DIAGNOSIS — M899 Disorder of bone, unspecified: Secondary | ICD-10-CM | POA: Diagnosis not present

## 2017-09-05 DIAGNOSIS — I129 Hypertensive chronic kidney disease with stage 1 through stage 4 chronic kidney disease, or unspecified chronic kidney disease: Secondary | ICD-10-CM | POA: Diagnosis not present

## 2017-09-05 DIAGNOSIS — E1122 Type 2 diabetes mellitus with diabetic chronic kidney disease: Secondary | ICD-10-CM | POA: Diagnosis not present

## 2017-09-05 DIAGNOSIS — E559 Vitamin D deficiency, unspecified: Secondary | ICD-10-CM | POA: Diagnosis not present

## 2017-09-05 DIAGNOSIS — E872 Acidosis: Secondary | ICD-10-CM | POA: Diagnosis not present

## 2017-09-12 DIAGNOSIS — R69 Illness, unspecified: Secondary | ICD-10-CM | POA: Diagnosis not present

## 2017-09-13 ENCOUNTER — Telehealth: Payer: Self-pay | Admitting: Oncology

## 2017-09-13 ENCOUNTER — Inpatient Hospital Stay: Payer: Medicare HMO

## 2017-09-13 ENCOUNTER — Inpatient Hospital Stay (HOSPITAL_BASED_OUTPATIENT_CLINIC_OR_DEPARTMENT_OTHER): Payer: Medicare HMO | Admitting: Oncology

## 2017-09-13 VITALS — BP 138/56 | HR 58 | Temp 97.6°F | Resp 17 | Ht 68.0 in | Wt 126.6 lb

## 2017-09-13 DIAGNOSIS — I129 Hypertensive chronic kidney disease with stage 1 through stage 4 chronic kidney disease, or unspecified chronic kidney disease: Secondary | ICD-10-CM | POA: Diagnosis not present

## 2017-09-13 DIAGNOSIS — N189 Chronic kidney disease, unspecified: Secondary | ICD-10-CM

## 2017-09-13 DIAGNOSIS — D696 Thrombocytopenia, unspecified: Secondary | ICD-10-CM | POA: Diagnosis not present

## 2017-09-13 DIAGNOSIS — D631 Anemia in chronic kidney disease: Secondary | ICD-10-CM

## 2017-09-13 DIAGNOSIS — E611 Iron deficiency: Secondary | ICD-10-CM | POA: Diagnosis not present

## 2017-09-13 DIAGNOSIS — D509 Iron deficiency anemia, unspecified: Secondary | ICD-10-CM

## 2017-09-13 LAB — CBC WITH DIFFERENTIAL/PLATELET
BASOS ABS: 0 10*3/uL (ref 0.0–0.1)
Basophils Relative: 1 %
EOS ABS: 0.1 10*3/uL (ref 0.0–0.5)
EOS PCT: 3 %
HCT: 31.3 % — ABNORMAL LOW (ref 34.8–46.6)
Hemoglobin: 9.9 g/dL — ABNORMAL LOW (ref 11.6–15.9)
Lymphocytes Relative: 29 %
Lymphs Abs: 1 10*3/uL (ref 0.9–3.3)
MCH: 31.3 pg (ref 25.1–34.0)
MCHC: 31.6 g/dL (ref 31.5–36.0)
MCV: 98.9 fL (ref 79.5–101.0)
MONO ABS: 0.5 10*3/uL (ref 0.1–0.9)
Monocytes Relative: 14 %
NEUTROS PCT: 53 %
Neutro Abs: 1.9 10*3/uL (ref 1.5–6.5)
PLATELETS: 84 10*3/uL — AB (ref 145–400)
RBC: 3.17 MIL/uL — ABNORMAL LOW (ref 3.70–5.45)
RDW: 16.6 % — AB (ref 11.2–16.1)
WBC: 3.5 10*3/uL — ABNORMAL LOW (ref 3.9–10.3)

## 2017-09-13 MED ORDER — DARBEPOETIN ALFA 300 MCG/0.6ML IJ SOSY
300.0000 ug | PREFILLED_SYRINGE | Freq: Once | INTRAMUSCULAR | Status: AC
  Administered 2017-09-13: 300 ug via SUBCUTANEOUS

## 2017-09-13 MED ORDER — DARBEPOETIN ALFA 300 MCG/0.6ML IJ SOSY
PREFILLED_SYRINGE | INTRAMUSCULAR | Status: AC
  Filled 2017-09-13: qty 0.6

## 2017-09-13 NOTE — Progress Notes (Signed)
Hematology and Oncology Follow Up Visit  [name] [id number] 08/10/1941 77 y.o. 09/13/2017 9:06 AM [name], MDWilson, Jeanne Flavors, MD   Principle Diagnosis: 77 year old female with:  1. Anemia of renal disease diagnosed in 2011.  She continues to have chronic renal insufficiency without any need for dialysis.  2.  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  No intervention is needed with platelet counts have been always above 50,000.  Current therapy: Aranesp 300 mcg subcutaneously every 3 weeks keeping hemoglobin above 11.   Interim History:  [name] is here with her husband for a follow-up.  She reports no major changes in her health since last visit.  She continues to ambulate slowly without any falls or syncope.  She denies any bleeding complications such as hematochezia, melena, hemoptysis or petechiae.  She denies any excessive fatigue or tiredness.  She denies any urination difficulties.  Her performance status and activity level remained stable.  She denies any early satiety or weight loss.   She has not reported any headaches or blurry vision or double vision.  She does not report any fevers or chills or sweats.  She does not report any chest pain shortness of breath or difficulty breathing. She does not report any nausea or vomiting or abdominal pain. She does not report any genitourinary complaints. She has not reported any bleeding or clotting tendencies. She has not reported any lymphadenopathy or petechiae. Rest of her review of systems is negative.   Medications: I have reviewed the patient's current medications.  Current Outpatient Medications  Medication Sig Dispense Refill  . ALPRAZolam (XANAX) 0.5 MG tablet Take 0.5 mg by mouth at bedtime.     . AMITIZA 24 MCG capsule     . amLODipine (NORVASC) 5 MG tablet Take 1 tablet (5 mg total) by mouth daily. 90 tablet 3  . carvedilol (COREG) 12.5 MG tablet TAKE 1 TABLET(12.5 MG) BY MOUTH TWICE DAILY  60 tablet 9  . Darbepoetin Alfa (ARANESP) 300 MCG/0.6ML SOSY injection Inject 300 mcg into the skin once.    . furosemide (LASIX) 20 MG tablet Take 20 mg by mouth daily. As directed    . losartan (COZAAR) 25 MG tablet Take 1 tablet (25 mg total) by mouth daily. 90 tablet 3  . rosuvastatin (CRESTOR) 5 MG tablet TAKE 1 TABLET(5 MG) BY MOUTH DAILY 90 tablet 3  . sevelamer carbonate (RENVELA) 800 MG tablet Take 1,600 mg 3 (three) times daily with meals by mouth.     . sodium bicarbonate 650 MG tablet Take 650 mg by mouth 2 (two) times daily.    [name] trimethoprim (TRIMPEX) 100 MG tablet Take 50 mg by mouth daily.     . VELTASSA 8.4 G packet Take 8.4 g by mouth daily after lunch.      No current facility-administered medications for this visit.      Allergies:  Allergies  Allergen Reactions  . Lipitor [Atorvastatin] Other (See Comments)    Stomach pain  . Strawberry Extract Rash    Past Medical History, Surgical history, Social history, and Family History reviewed and remain unchanged.    Physical Exam: Blood pressure (!) 138/56, pulse (!) 58, temperature 97.6 F (36.4 C), temperature source Oral, resp. rate 17, height 5\' 8"  (1.727 m), weight 126 lb 9.6 oz (57.4 kg), SpO2 100 %.   ECOG: 1 General appearance: Well-appearing woman appeared comfortable. Head: Normocephalic, without obvious abnormality  Oropharynx: Mucosa is moist and pink.  No oral ulcers  or lesions. Eyes: No scleral icterus. Lymph nodes: Cervical, supraclavicular, and axillary nodes normal. Heart:regular rate and rhythm, S1, S2 normal, no murmur, click, rub or gallop Lung:chest clear throughout auscultation., no wheezing. Abdomen: soft, non-tender, without masses or organomegaly no shifting dullness or ascites. Musculoskeletal: No joint deformity or effusion. Skin: No rashes or lesions.   Lab Results: Lab Results  Component Value Date   WBC 3.5 (L) 09/13/2017   HGB 9.9 (L) 09/13/2017   HCT 31.3 (L) 09/13/2017    MCV 98.9 09/13/2017   PLT 84 (L) 09/13/2017     Chemistry      Component Value Date/Time   NA 137 11/03/2016 0332   NA 136 08/03/2016 1006   K 4.1 11/03/2016 0332   K 5.0 08/03/2016 1006   CL 110 11/03/2016 0332   CL 101 08/16/2012 0757   CO2 17 (L) 11/03/2016 0332   CO2 20 (L) 08/03/2016 1006   BUN 50 (H) 11/03/2016 0332   BUN 44.9 (H) 08/03/2016 1006   CREATININE 3.62 (H) 11/03/2016 0332   CREATININE 3.6 (HH) 08/03/2016 1006      Component Value Date/Time   CALCIUM 8.1 (L) 11/03/2016 0332   CALCIUM 9.3 08/03/2016 1006   ALKPHOS 31 (L) 10/31/2016 1159   ALKPHOS 66 08/03/2016 1006   AST 14 (L) 10/31/2016 1159   AST 19 08/03/2016 1006   ALT 11 (L) 10/31/2016 1159   ALT 15 08/03/2016 1006   BILITOT 0.2 (L) 10/31/2016 1159   BILITOT 0.43 08/03/2016 1006     Results for [name] (MRN [id number]) as of 09/13/2017 08:59  Ref. Range 06/21/2017 08:11  Iron Latest Ref Range: 41 - 142 ug/dL 82  UIBC Latest Ref Range: 120 - 384 ug/dL 293  TIBC Latest Ref Range: 236 - 444 ug/dL 375  %SAT Latest Ref Range: 21 - 57 % 22  Ferritin Latest Ref Range: 9 - 269 ng/ml 15       Impression and Plan:   77 year old female with the following issues:   1.  Multifactorial anemia: She has an element of anemia of renal disease and iron deficiency.    She is currently on Aranesp 300 g every 3 weeks to keep her hemoglobin above 11.  She also receives iron intermittently if needed to.  Iron studies obtained in November 2018 showed normal levels with slightly declining ferritin.  We will continue to monitor this closely and replace as needed.  Risks and benefits of continuing Aranesp long-term was discussed today.  Complications include hypertension among others were reiterated.  She is agreeable to continue for the time being.  2. Thrombocytopenia: Related to reactive findings.  ITP could also be a possibility.  Her platelet counts from today continues to be adequate without active  bleeding.  No further intervention is needed at this time but continues monitoring will be needed.   3. Renal failure: Kidney function remained stable and has not required hemodialysis.  She continues to follow with nephrology regarding this issue.  4. Follow-up: She will continue to follow-up every 3 weeks for Aranesp and MD follow-up in 6 months.  15  minutes was spent with the patient face-to-face today.  More than 50% of time was dedicated to patient counseling, education and coordination of her multifaceted care.       [name] 1/30/20199:06 AM

## 2017-09-13 NOTE — Telephone Encounter (Signed)
Scheduled appt per 1/30 los - Gave patient AVS and calender per los.  

## 2017-09-13 NOTE — Patient Instructions (Signed)

## 2017-10-02 DIAGNOSIS — L720 Epidermal cyst: Secondary | ICD-10-CM | POA: Diagnosis not present

## 2017-10-04 ENCOUNTER — Inpatient Hospital Stay: Payer: Medicare HMO

## 2017-10-04 ENCOUNTER — Inpatient Hospital Stay: Payer: Medicare HMO | Attending: Oncology

## 2017-10-04 VITALS — BP 134/63 | HR 67 | Temp 98.0°F | Resp 20

## 2017-10-04 DIAGNOSIS — N189 Chronic kidney disease, unspecified: Secondary | ICD-10-CM | POA: Diagnosis not present

## 2017-10-04 DIAGNOSIS — I129 Hypertensive chronic kidney disease with stage 1 through stage 4 chronic kidney disease, or unspecified chronic kidney disease: Secondary | ICD-10-CM | POA: Insufficient documentation

## 2017-10-04 DIAGNOSIS — D509 Iron deficiency anemia, unspecified: Secondary | ICD-10-CM

## 2017-10-04 DIAGNOSIS — D631 Anemia in chronic kidney disease: Secondary | ICD-10-CM | POA: Diagnosis present

## 2017-10-04 DIAGNOSIS — D696 Thrombocytopenia, unspecified: Secondary | ICD-10-CM

## 2017-10-04 LAB — CBC WITH DIFFERENTIAL/PLATELET
Basophils Absolute: 0 10*3/uL (ref 0.0–0.1)
Basophils Relative: 1 %
EOS ABS: 0.1 10*3/uL (ref 0.0–0.5)
EOS PCT: 2 %
HCT: 28.9 % — ABNORMAL LOW (ref 34.8–46.6)
Hemoglobin: 8.9 g/dL — ABNORMAL LOW (ref 11.6–15.9)
Lymphocytes Relative: 11 %
Lymphs Abs: 0.5 10*3/uL — ABNORMAL LOW (ref 0.9–3.3)
MCH: 31.6 pg (ref 25.1–34.0)
MCHC: 30.8 g/dL — AB (ref 31.5–36.0)
MCV: 102.5 fL — ABNORMAL HIGH (ref 79.5–101.0)
MONO ABS: 0.6 10*3/uL (ref 0.1–0.9)
MONOS PCT: 15 %
Neutro Abs: 3 10*3/uL (ref 1.5–6.5)
Neutrophils Relative %: 71 %
PLATELETS: 76 10*3/uL — AB (ref 145–400)
RBC: 2.82 MIL/uL — ABNORMAL LOW (ref 3.70–5.45)
RDW: 15.4 % — AB (ref 11.2–14.5)
WBC: 4.2 10*3/uL (ref 3.9–10.3)

## 2017-10-04 MED ORDER — DARBEPOETIN ALFA 300 MCG/0.6ML IJ SOSY
300.0000 ug | PREFILLED_SYRINGE | Freq: Once | INTRAMUSCULAR | Status: AC
  Administered 2017-10-04: 300 ug via SUBCUTANEOUS

## 2017-10-04 NOTE — Patient Instructions (Signed)

## 2017-10-09 DIAGNOSIS — N185 Chronic kidney disease, stage 5: Secondary | ICD-10-CM | POA: Diagnosis not present

## 2017-10-09 DIAGNOSIS — N189 Chronic kidney disease, unspecified: Secondary | ICD-10-CM | POA: Diagnosis not present

## 2017-10-09 DIAGNOSIS — M899 Disorder of bone, unspecified: Secondary | ICD-10-CM | POA: Diagnosis not present

## 2017-10-12 DIAGNOSIS — E875 Hyperkalemia: Secondary | ICD-10-CM | POA: Diagnosis not present

## 2017-10-12 DIAGNOSIS — N189 Chronic kidney disease, unspecified: Secondary | ICD-10-CM | POA: Diagnosis not present

## 2017-10-12 DIAGNOSIS — D631 Anemia in chronic kidney disease: Secondary | ICD-10-CM | POA: Diagnosis not present

## 2017-10-12 DIAGNOSIS — N185 Chronic kidney disease, stage 5: Secondary | ICD-10-CM | POA: Diagnosis not present

## 2017-10-12 DIAGNOSIS — E1122 Type 2 diabetes mellitus with diabetic chronic kidney disease: Secondary | ICD-10-CM | POA: Diagnosis not present

## 2017-10-12 DIAGNOSIS — M899 Disorder of bone, unspecified: Secondary | ICD-10-CM | POA: Diagnosis not present

## 2017-10-12 DIAGNOSIS — I120 Hypertensive chronic kidney disease with stage 5 chronic kidney disease or end stage renal disease: Secondary | ICD-10-CM | POA: Diagnosis not present

## 2017-10-25 ENCOUNTER — Inpatient Hospital Stay: Payer: Medicare HMO | Attending: Oncology

## 2017-10-25 ENCOUNTER — Inpatient Hospital Stay: Payer: Medicare HMO

## 2017-10-25 VITALS — BP 138/62 | HR 64 | Temp 98.3°F | Resp 18

## 2017-10-25 DIAGNOSIS — I129 Hypertensive chronic kidney disease with stage 1 through stage 4 chronic kidney disease, or unspecified chronic kidney disease: Secondary | ICD-10-CM | POA: Insufficient documentation

## 2017-10-25 DIAGNOSIS — D696 Thrombocytopenia, unspecified: Secondary | ICD-10-CM

## 2017-10-25 DIAGNOSIS — N189 Chronic kidney disease, unspecified: Secondary | ICD-10-CM | POA: Diagnosis not present

## 2017-10-25 DIAGNOSIS — D631 Anemia in chronic kidney disease: Secondary | ICD-10-CM | POA: Diagnosis present

## 2017-10-25 LAB — CBC WITH DIFFERENTIAL/PLATELET
BASOS ABS: 0 10*3/uL (ref 0.0–0.1)
Basophils Relative: 0 %
EOS PCT: 5 %
Eosinophils Absolute: 0.2 10*3/uL (ref 0.0–0.5)
HCT: 28.7 % — ABNORMAL LOW (ref 34.8–46.6)
Hemoglobin: 8.8 g/dL — ABNORMAL LOW (ref 11.6–15.9)
LYMPHS PCT: 25 %
Lymphs Abs: 1.1 10*3/uL (ref 0.9–3.3)
MCH: 31.7 pg (ref 25.1–34.0)
MCHC: 30.7 g/dL — ABNORMAL LOW (ref 31.5–36.0)
MCV: 103.2 fL — ABNORMAL HIGH (ref 79.5–101.0)
Monocytes Absolute: 0.4 10*3/uL (ref 0.1–0.9)
Monocytes Relative: 8 %
Neutro Abs: 2.7 10*3/uL (ref 1.5–6.5)
Neutrophils Relative %: 62 %
PLATELETS: 78 10*3/uL — AB (ref 145–400)
RBC: 2.78 MIL/uL — AB (ref 3.70–5.45)
RDW: 16.2 % — ABNORMAL HIGH (ref 11.2–14.5)
WBC: 4.3 10*3/uL (ref 3.9–10.3)

## 2017-10-25 LAB — IRON AND TIBC
IRON: 72 ug/dL (ref 41–142)
SATURATION RATIOS: 19 % — AB (ref 21–57)
TIBC: 383 ug/dL (ref 236–444)
UIBC: 311 ug/dL

## 2017-10-25 LAB — FERRITIN: Ferritin: 24 ng/mL (ref 9–269)

## 2017-10-25 MED ORDER — DARBEPOETIN ALFA 300 MCG/0.6ML IJ SOSY
300.0000 ug | PREFILLED_SYRINGE | Freq: Once | INTRAMUSCULAR | Status: AC
  Administered 2017-10-25: 300 ug via SUBCUTANEOUS

## 2017-10-25 NOTE — Patient Instructions (Signed)

## 2017-10-26 ENCOUNTER — Other Ambulatory Visit: Payer: Self-pay | Admitting: *Deleted

## 2017-10-26 ENCOUNTER — Telehealth: Payer: Self-pay | Admitting: Nurse Practitioner

## 2017-10-26 ENCOUNTER — Encounter: Payer: Self-pay | Admitting: *Deleted

## 2017-10-26 ENCOUNTER — Telehealth: Payer: Self-pay | Admitting: *Deleted

## 2017-10-26 DIAGNOSIS — D631 Anemia in chronic kidney disease: Secondary | ICD-10-CM

## 2017-10-26 DIAGNOSIS — N189 Chronic kidney disease, unspecified: Principal | ICD-10-CM

## 2017-10-26 NOTE — Telephone Encounter (Signed)
Ok to step up 2 units of PRBCs

## 2017-10-26 NOTE — Telephone Encounter (Signed)
Spoke with patient in regards to her SOB and weakness..  She has no energy..   Her most recent hgb was 8.8 (3/13) and I deferred her to her PCP or Oncologist.. Patient verbalized understanding.[name]

## 2017-10-26 NOTE — Telephone Encounter (Signed)
New message    Pt c/o Shortness Of Breath: STAT if SOB developed within the last 24 hours or pt is noticeably SOB on the phone  1. Are you currently SOB (can you hear that pt is SOB on the phone)? no  2. How long have you been experiencing SOB?  A week , she said it is worse this morning   3. Are you SOB when sitting or when up moving around?  Both   4. Are you currently experiencing any other symptoms? Patient says her hemoglobin is low and she is very weak as well

## 2017-10-26 NOTE — Progress Notes (Signed)
p-er dr shdadad, patient needs cbc and T& X  for 2 units p-rbc's.Spoke with patient. She is unable to get transportation here today. Sent priority los to schedulers for lab tomorrow 10/27/17 @ 9:00 am and transfusion for 5 hours @ 10:00. Lab and blood orders are in epic.

## 2017-10-26 NOTE — Telephone Encounter (Signed)
"  Yesterday's HGB low at 8.6 or something.  For the past week I've been short of breath, extremely tired and as you can probably tell I can hardly talk.  Called my cardiologist, I may need transfusion there is why I'm calling.  My husband is not here so I could not come in until tomorrow."  Call transferred to collaborative.  No audible shortness of breath at this time.  Recognizable fatigue with slow, flowing voice attempting to communicate.  Instructed to report to ED if breathing or fatigue not tolerated before any future [place] appointments.

## 2017-10-26 NOTE — Telephone Encounter (Signed)
CBC and type and cross for 2 units of blood

## 2017-10-26 NOTE — Telephone Encounter (Signed)
Patient calling to say she is extremely weak and SOB, thinks she needs a transfusion. Would like to come in and have her blood checked.

## 2017-10-27 ENCOUNTER — Inpatient Hospital Stay: Payer: Medicare HMO

## 2017-10-27 DIAGNOSIS — D631 Anemia in chronic kidney disease: Secondary | ICD-10-CM

## 2017-10-27 DIAGNOSIS — N189 Chronic kidney disease, unspecified: Principal | ICD-10-CM

## 2017-10-27 DIAGNOSIS — I129 Hypertensive chronic kidney disease with stage 1 through stage 4 chronic kidney disease, or unspecified chronic kidney disease: Secondary | ICD-10-CM | POA: Diagnosis not present

## 2017-10-27 LAB — ABO/RH: ABO/RH(D): O NEG

## 2017-10-27 LAB — CBC WITH DIFFERENTIAL (CANCER CENTER ONLY)
BASOS ABS: 0 10*3/uL (ref 0.0–0.1)
BASOS PCT: 1 %
Eosinophils Absolute: 0.1 10*3/uL (ref 0.0–0.5)
Eosinophils Relative: 3 %
HEMATOCRIT: 26.6 % — AB (ref 34.8–46.6)
HEMOGLOBIN: 8.5 g/dL — AB (ref 11.6–15.9)
LYMPHS PCT: 19 %
Lymphs Abs: 0.8 10*3/uL — ABNORMAL LOW (ref 0.9–3.3)
MCH: 31.8 pg (ref 25.1–34.0)
MCHC: 31.8 g/dL (ref 31.5–36.0)
MCV: 99.8 fL (ref 79.5–101.0)
MONOS PCT: 10 %
Monocytes Absolute: 0.4 10*3/uL (ref 0.1–0.9)
NEUTROS PCT: 67 %
Neutro Abs: 2.8 10*3/uL (ref 1.5–6.5)
Platelet Count: 85 10*3/uL — ABNORMAL LOW (ref 145–400)
RBC: 2.66 MIL/uL — ABNORMAL LOW (ref 3.70–5.45)
RDW: 17 % — ABNORMAL HIGH (ref 11.2–14.5)
WBC Count: 4.2 10*3/uL (ref 3.9–10.3)

## 2017-10-27 LAB — SAMPLE TO BLOOD BANK

## 2017-10-27 LAB — PREPARE RBC (CROSSMATCH)

## 2017-10-27 MED ORDER — DIPHENHYDRAMINE HCL 25 MG PO CAPS
ORAL_CAPSULE | ORAL | Status: AC
  Filled 2017-10-27: qty 1

## 2017-10-27 MED ORDER — ACETAMINOPHEN 325 MG PO TABS
650.0000 mg | ORAL_TABLET | Freq: Once | ORAL | Status: AC
  Administered 2017-10-27: 650 mg via ORAL

## 2017-10-27 MED ORDER — ACETAMINOPHEN 325 MG PO TABS
ORAL_TABLET | ORAL | Status: AC
  Filled 2017-10-27: qty 2

## 2017-10-27 MED ORDER — SODIUM CHLORIDE 0.9 % IV SOLN
250.0000 mL | Freq: Once | INTRAVENOUS | Status: AC
  Administered 2017-10-27: 250 mL via INTRAVENOUS

## 2017-10-27 MED ORDER — DIPHENHYDRAMINE HCL 25 MG PO CAPS
25.0000 mg | ORAL_CAPSULE | Freq: Once | ORAL | Status: AC
  Administered 2017-10-27: 25 mg via ORAL

## 2017-10-27 NOTE — Progress Notes (Signed)
Per Dr. Alen Blew, pt still to receive 2 units PRBC with Hgb of 8.5

## 2017-10-27 NOTE — Patient Instructions (Signed)

## 2017-10-28 LAB — BPAM RBC
Blood Product Expiration Date: 201904142359
Blood Product Expiration Date: 201904152359
ISSUE DATE / TIME: 201903151037
ISSUE DATE / TIME: 201903151037
UNIT TYPE AND RH: 9500
Unit Type and Rh: 9500

## 2017-10-28 LAB — TYPE AND SCREEN
ABO/RH(D): O NEG
Antibody Screen: NEGATIVE
UNIT DIVISION: 0
UNIT DIVISION: 0

## 2017-11-06 DIAGNOSIS — N185 Chronic kidney disease, stage 5: Secondary | ICD-10-CM | POA: Diagnosis not present

## 2017-11-06 DIAGNOSIS — Z0000 Encounter for general adult medical examination without abnormal findings: Secondary | ICD-10-CM | POA: Diagnosis not present

## 2017-11-06 DIAGNOSIS — I120 Hypertensive chronic kidney disease with stage 5 chronic kidney disease or end stage renal disease: Secondary | ICD-10-CM | POA: Diagnosis not present

## 2017-11-06 DIAGNOSIS — E1122 Type 2 diabetes mellitus with diabetic chronic kidney disease: Secondary | ICD-10-CM | POA: Diagnosis not present

## 2017-11-06 DIAGNOSIS — R69 Illness, unspecified: Secondary | ICD-10-CM | POA: Diagnosis not present

## 2017-11-15 ENCOUNTER — Inpatient Hospital Stay: Payer: Medicare HMO | Attending: Oncology

## 2017-11-15 ENCOUNTER — Inpatient Hospital Stay: Payer: Medicare HMO

## 2017-11-15 VITALS — BP 123/57 | HR 60 | Temp 97.7°F | Resp 20

## 2017-11-15 DIAGNOSIS — D696 Thrombocytopenia, unspecified: Secondary | ICD-10-CM

## 2017-11-15 DIAGNOSIS — I129 Hypertensive chronic kidney disease with stage 1 through stage 4 chronic kidney disease, or unspecified chronic kidney disease: Secondary | ICD-10-CM | POA: Insufficient documentation

## 2017-11-15 DIAGNOSIS — N189 Chronic kidney disease, unspecified: Secondary | ICD-10-CM | POA: Insufficient documentation

## 2017-11-15 DIAGNOSIS — D631 Anemia in chronic kidney disease: Secondary | ICD-10-CM

## 2017-11-15 LAB — CBC WITH DIFFERENTIAL/PLATELET
Basophils Absolute: 0 10*3/uL (ref 0.0–0.1)
Basophils Relative: 1 %
EOS ABS: 0.1 10*3/uL (ref 0.0–0.5)
Eosinophils Relative: 3 %
HCT: 33.1 % — ABNORMAL LOW (ref 34.8–46.6)
HEMOGLOBIN: 10.4 g/dL — AB (ref 11.6–15.9)
LYMPHS ABS: 0.8 10*3/uL — AB (ref 0.9–3.3)
LYMPHS PCT: 20 %
MCH: 30.9 pg (ref 25.1–34.0)
MCHC: 31.3 g/dL — AB (ref 31.5–36.0)
MCV: 98.9 fL (ref 79.5–101.0)
MONOS PCT: 10 %
Monocytes Absolute: 0.4 10*3/uL (ref 0.1–0.9)
NEUTROS PCT: 66 %
Neutro Abs: 2.7 10*3/uL (ref 1.5–6.5)
Platelets: 79 10*3/uL — ABNORMAL LOW (ref 145–400)
RBC: 3.35 MIL/uL — AB (ref 3.70–5.45)
RDW: 18.8 % — ABNORMAL HIGH (ref 11.2–14.5)
WBC: 4 10*3/uL (ref 3.9–10.3)

## 2017-11-15 MED ORDER — DARBEPOETIN ALFA 300 MCG/0.6ML IJ SOSY
300.0000 ug | PREFILLED_SYRINGE | Freq: Once | INTRAMUSCULAR | Status: AC
  Administered 2017-11-15: 300 ug via SUBCUTANEOUS

## 2017-11-15 NOTE — Patient Instructions (Signed)

## 2017-11-16 DIAGNOSIS — M899 Disorder of bone, unspecified: Secondary | ICD-10-CM | POA: Diagnosis not present

## 2017-11-16 DIAGNOSIS — M1711 Unilateral primary osteoarthritis, right knee: Secondary | ICD-10-CM | POA: Diagnosis not present

## 2017-11-16 DIAGNOSIS — M25461 Effusion, right knee: Secondary | ICD-10-CM | POA: Diagnosis not present

## 2017-11-16 DIAGNOSIS — N189 Chronic kidney disease, unspecified: Secondary | ICD-10-CM | POA: Diagnosis not present

## 2017-11-16 DIAGNOSIS — M25561 Pain in right knee: Secondary | ICD-10-CM | POA: Diagnosis not present

## 2017-11-16 DIAGNOSIS — N185 Chronic kidney disease, stage 5: Secondary | ICD-10-CM | POA: Diagnosis not present

## 2017-11-21 DIAGNOSIS — M25561 Pain in right knee: Secondary | ICD-10-CM | POA: Diagnosis not present

## 2017-11-23 DIAGNOSIS — D631 Anemia in chronic kidney disease: Secondary | ICD-10-CM | POA: Diagnosis not present

## 2017-11-23 DIAGNOSIS — Z8673 Personal history of transient ischemic attack (TIA), and cerebral infarction without residual deficits: Secondary | ICD-10-CM | POA: Diagnosis not present

## 2017-11-23 DIAGNOSIS — E872 Acidosis: Secondary | ICD-10-CM | POA: Diagnosis not present

## 2017-11-23 DIAGNOSIS — I2510 Atherosclerotic heart disease of native coronary artery without angina pectoris: Secondary | ICD-10-CM | POA: Diagnosis not present

## 2017-11-23 DIAGNOSIS — E1122 Type 2 diabetes mellitus with diabetic chronic kidney disease: Secondary | ICD-10-CM | POA: Diagnosis not present

## 2017-11-23 DIAGNOSIS — M899 Disorder of bone, unspecified: Secondary | ICD-10-CM | POA: Diagnosis not present

## 2017-11-23 DIAGNOSIS — I120 Hypertensive chronic kidney disease with stage 5 chronic kidney disease or end stage renal disease: Secondary | ICD-10-CM | POA: Diagnosis not present

## 2017-11-23 DIAGNOSIS — N185 Chronic kidney disease, stage 5: Secondary | ICD-10-CM | POA: Diagnosis not present

## 2017-12-05 ENCOUNTER — Encounter: Payer: Self-pay | Admitting: *Deleted

## 2017-12-05 DIAGNOSIS — D631 Anemia in chronic kidney disease: Secondary | ICD-10-CM

## 2017-12-05 DIAGNOSIS — N189 Chronic kidney disease, unspecified: Principal | ICD-10-CM

## 2017-12-06 ENCOUNTER — Inpatient Hospital Stay: Payer: Medicare HMO

## 2017-12-06 VITALS — BP 110/62 | HR 66 | Temp 97.7°F | Resp 18

## 2017-12-06 DIAGNOSIS — N189 Chronic kidney disease, unspecified: Principal | ICD-10-CM

## 2017-12-06 DIAGNOSIS — D631 Anemia in chronic kidney disease: Secondary | ICD-10-CM

## 2017-12-06 DIAGNOSIS — D696 Thrombocytopenia, unspecified: Secondary | ICD-10-CM

## 2017-12-06 DIAGNOSIS — I129 Hypertensive chronic kidney disease with stage 1 through stage 4 chronic kidney disease, or unspecified chronic kidney disease: Secondary | ICD-10-CM | POA: Diagnosis not present

## 2017-12-06 LAB — CBC WITH DIFFERENTIAL (CANCER CENTER ONLY)
Basophils Absolute: 0 10*3/uL (ref 0.0–0.1)
Basophils Relative: 1 %
EOS PCT: 2 %
Eosinophils Absolute: 0.1 10*3/uL (ref 0.0–0.5)
HCT: 32.4 % — ABNORMAL LOW (ref 34.8–46.6)
Hemoglobin: 10.2 g/dL — ABNORMAL LOW (ref 11.6–15.9)
LYMPHS ABS: 0.9 10*3/uL (ref 0.9–3.3)
LYMPHS PCT: 22 %
MCH: 31.3 pg (ref 25.1–34.0)
MCHC: 31.5 g/dL (ref 31.5–36.0)
MCV: 99.4 fL (ref 79.5–101.0)
MONO ABS: 0.3 10*3/uL (ref 0.1–0.9)
Monocytes Relative: 7 %
Neutro Abs: 2.9 10*3/uL (ref 1.5–6.5)
Neutrophils Relative %: 68 %
PLATELETS: 81 10*3/uL — AB (ref 145–400)
RBC: 3.26 MIL/uL — ABNORMAL LOW (ref 3.70–5.45)
RDW: 17.3 % — AB (ref 11.2–14.5)
WBC Count: 4.3 10*3/uL (ref 3.9–10.3)

## 2017-12-06 MED ORDER — DARBEPOETIN ALFA 300 MCG/0.6ML IJ SOSY
300.0000 ug | PREFILLED_SYRINGE | Freq: Once | INTRAMUSCULAR | Status: AC
  Administered 2017-12-06: 300 ug via SUBCUTANEOUS

## 2017-12-06 NOTE — Patient Instructions (Signed)

## 2017-12-07 ENCOUNTER — Other Ambulatory Visit (HOSPITAL_COMMUNITY): Payer: Self-pay | Admitting: Sports Medicine

## 2017-12-07 ENCOUNTER — Ambulatory Visit (HOSPITAL_COMMUNITY)
Admission: RE | Admit: 2017-12-07 | Discharge: 2017-12-07 | Disposition: A | Discharge status: Home / Self Care | Payer: Medicare HMO | Source: Ambulatory Visit | Attending: Sports Medicine | Admitting: Sports Medicine

## 2017-12-07 DIAGNOSIS — M79604 Pain in right leg: Secondary | ICD-10-CM

## 2017-12-07 DIAGNOSIS — M79661 Pain in right lower leg: Secondary | ICD-10-CM | POA: Diagnosis present

## 2017-12-07 DIAGNOSIS — M7989 Other specified soft tissue disorders: Secondary | ICD-10-CM

## 2017-12-07 DIAGNOSIS — M79662 Pain in left lower leg: Secondary | ICD-10-CM | POA: Insufficient documentation

## 2017-12-07 DIAGNOSIS — M79605 Pain in left leg: Secondary | ICD-10-CM | POA: Insufficient documentation

## 2017-12-07 DIAGNOSIS — R2241 Localized swelling, mass and lump, right lower limb: Secondary | ICD-10-CM | POA: Diagnosis not present

## 2017-12-07 DIAGNOSIS — M7121 Synovial cyst of popliteal space [Baker], right knee: Secondary | ICD-10-CM | POA: Insufficient documentation

## 2017-12-07 DIAGNOSIS — M25561 Pain in right knee: Secondary | ICD-10-CM | POA: Diagnosis not present

## 2017-12-07 NOTE — Progress Notes (Signed)
RLE venous duplex prelim: Negative for DVT. Superficial thrombosis noted a varicose vein of the right medial calf. A large, lobulated cystic structure is noted in the popliteal fossa. Landry Mellow, RDMS, RVT  1:15 PM Attempted call report to Dr. Delilah Shan with no answer to main line. No other number was given for call report. 1:21 PM Will let patient leave.

## 2017-12-09 DIAGNOSIS — M25561 Pain in right knee: Secondary | ICD-10-CM | POA: Diagnosis not present

## 2017-12-16 DIAGNOSIS — M25561 Pain in right knee: Secondary | ICD-10-CM | POA: Diagnosis not present

## 2017-12-16 DIAGNOSIS — R2241 Localized swelling, mass and lump, right lower limb: Secondary | ICD-10-CM | POA: Diagnosis not present

## 2017-12-18 NOTE — Progress Notes (Deleted)
CARDIOLOGY OFFICE NOTE  Date:  12/19/2017    [name] Date of Birth: [birth year] Medical Record #[id number]  PCP:  [name], MD  Cardiologist:  [name] & ***    No chief complaint on file.   History of Present Illness: [name] is a 77 y.o. female who presents today for a ***   [name] in today. Here with   Past Medical History:  Diagnosis Date  . Anemia    Pt is taking iron.   [name] Anxiety   . Arthritis   . Carotid stenosis    40-59% bilateral ICA stenosis in 2/12.  . Chronic low back pain   . CKD (chronic kidney disease)    [facility]. Audie Clear at [facility]  . Coronary artery disease    Pt presented 2/10 to [facility] with NSTEMI and diastolic CHF exacerbation.  LHC was done  3/10 showing 99% pRCA stenosis and 80% calcified pLAD stenosis with L=>R collaterals.  Pt was referred  for CABG which was done by Dr. Prescott Gum with LIMA-LAD, SVG-RCA, SVG-OM.  . Diabetes mellitus   . Diabetic neuropathy ([place])   . Diastolic CHF (HCC)    Echo (2/10) showed EF 55-65%, mild LVH, diastolic dysfunction, mild AS with mean gradient 12 mmHg, PASP 43 mmHg.  Echo (2/12): EF 55-60%, mild LVH, mild AS (mean gradient 12), PA systolic pressure 32 mmHg.     [name] GERD (gastroesophageal reflux disease)   . Heart murmur   . Hyperlipidemia   . Hypertension   . Mild aortic stenosis    mean gradient 12 mmHg in 2/12.  . Myocardial infarction ([place])    "mild"  . Pneumonia   . PONV (postoperative nausea and vomiting)   . Thrombocytopenia ([name])   . Unsteady gait     Past Surgical History:  Procedure Laterality Date  . AV FISTULA PLACEMENT Left 01/30/2015   Procedure: Creation of a Radial Cephalic AV Fistula left wrist;  Surgeon: [name], MD;  Location: [place];  Service: Vascular;  Laterality: Left;  . BACK SURGERY     multiple  . BREAST SURGERY     biopsy  . CARDIAC CATHETERIZATION    . CATARACT EXTRACTION W/ INTRAOCULAR LENS  IMPLANT, BILATERAL    . COLONOSCOPY Left  11/03/2016   Procedure: COLONOSCOPY;  Surgeon: [name], MD;  Location: [facility] ENDOSCOPY;  Service: Endoscopy;  Laterality: Left;  . COLONOSCOPY W/ BIOPSIES AND POLYPECTOMY    . CORONARY ARTERY BYPASS GRAFT  09/2008   pt with NSTEMI and diastolic CHF exacerbation.  LHC was done  3/10 showing 99% pRCA stenosis and 80% calcified pLAD stenosis with L=>R collaterals.  Pt was referred  for CABG which was done by Dr. Prescott Gum with LIMA-LAD, SVG-RCA, SVG-OM.  [name] ESOPHAGOGASTRODUODENOSCOPY N/A 11/02/2016   Procedure: ESOPHAGOGASTRODUODENOSCOPY (EGD);  Surgeon: [name], MD;  Location: [facility] ENDOSCOPY;  Service: Endoscopy;  Laterality: N/A;  . GIVENS CAPSULE STUDY N/A 11/29/2016   Procedure: GIVENS CAPSULE STUDY;  Surgeon: [name], MD;  Location: [place];  Service: Endoscopy;  Laterality: N/A;  . REVISON OF ARTERIOVENOUS FISTULA Left 07/02/2015   Procedure: REVISON OF LEFT RADIOCEPHALIC ARTERIOVENOUS FISTULA;  Surgeon: [name], MD;  Location: [place];  Service: Vascular;  Laterality: Left;     Medications: No outpatient medications have been marked as taking for the 12/19/17 encounter (Appointment) with Jeanne Junes, NP.     Allergies: Allergies  Allergen Reactions  . Lipitor [  Atorvastatin] Other (See Comments)    Stomach pain  . Strawberry Extract Rash    Social History: The patient  reports that she has quit smoking. Her smoking use included cigarettes. She has never used smokeless tobacco. She reports that she drinks alcohol. She reports that she does not use drugs.   Family History: The patient's ***She was adopted. Family history is unknown by patient.   Review of Systems: Please see the history of present illness.   Otherwise, the review of systems is positive for {NONE DEFAULTED:18576::"none"}.   All other systems are reviewed and negative.   Physical Exam: VS:  There were no vitals taken for this visit. [name]  BMI There is no height or weight on file to calculate  BMI.  Wt Readings from Last 3 Encounters:  09/13/17 126 lb 9.6 oz (57.4 kg)  06/27/17 127 lb 12.8 oz (58 kg)  05/10/17 126 lb 11.2 oz (57.5 kg)    General: Pleasant. Well developed, well nourished and in no acute distress.   HEENT: Normal.  Neck: Supple, no JVD, carotid bruits, or masses noted.  Cardiac: ***Regular rate and rhythm. No murmurs, rubs, or gallops. No edema.  Respiratory:  Lungs are clear to auscultation bilaterally with normal work of breathing.  GI: Soft and nontender.  MS: No deformity or atrophy. Gait and ROM intact.  Skin: Warm and dry. Color is normal.  Neuro:  Strength and sensation are intact and no gross focal deficits noted.  Psych: Alert, appropriate and with normal affect.   LABORATORY DATA:  EKG:  EKG {ACTION; IS/IS OIN:[id number]} ordered today. This demonstrates ***.  Lab Results  Component Value Date   WBC 4.3 12/06/2017   HGB 10.2 (L) 12/06/2017   HCT 32.4 (L) 12/06/2017   PLT 81 (L) 12/06/2017   GLUCOSE 112 (H) 11/03/2016   CHOL 99 09/27/2016   TRIG 84 09/27/2016   HDL 51 09/27/2016   LDLDIRECT 34.4 11/17/2008   LDLCALC 31 09/27/2016   ALT 11 (L) 10/31/2016   AST 14 (L) 10/31/2016   NA 137 11/03/2016   K 4.1 11/03/2016   CL 110 11/03/2016   CREATININE 3.62 (H) 11/03/2016   BUN 50 (H) 11/03/2016   CO2 17 (L) 11/03/2016   TSH 2.185 01/17/2014   INR 1.4 10/16/2008   HGBA1C 5.4 09/27/2016     BNP (last 3 results) No results for input(s): BNP in the last 8760 hours.  ProBNP (last 3 results) No results for input(s): PROBNP in the last 8760 hours.   Other Studies Reviewed Today:   Assessment/Plan:   Current medicines are reviewed with the patient today.  The patient does not have concerns regarding medicines other than what has been noted above.  The following changes have been made:  See above.  Labs/ tests ordered today include:   No orders of the defined types were placed in this encounter.    Disposition:   FU with ***  in {gen number 4-70:962836} {Days to years:10300}.   Patient is agreeable to this plan and will call if any problems develop in the interim.   SignedTruitt Merle, NP  12/19/2017 7:44 AM  [facility] [address] [place], [place]  [address] Phone: [phone] Fax: [phone]           CARDIOLOGY OFFICE NOTE  Date:  12/18/2017    [name] Date of Birth: 1940/09/18 Medical Record #[id number]  PCP:  [name], MD  Cardiologist:  [name] & ***    No chief complaint on file.   History of Present Illness: [name] is a 77 y.o. female who presents today for a ***   [name] in today. Here with   Past Medical History:  Diagnosis Date  . Anemia    Pt is taking iron.   [name] Anxiety   . Arthritis   . Carotid stenosis    40-59% bilateral ICA stenosis in 2/12.  . Chronic low back pain   . CKD (chronic kidney disease)    Dr. Audie Clear at [facility]  . Coronary artery disease    Pt presented 2/10 to [facility] with NSTEMI and diastolic CHF exacerbation.  LHC was done  3/10 showing 99% pRCA stenosis and 80% calcified pLAD stenosis with L=>R collaterals.  Pt was referred  for CABG which was done by Dr. Prescott Gum with LIMA-LAD, SVG-RCA, SVG-OM.  . Diabetes mellitus   . Diabetic neuropathy ([place])   . Diastolic CHF (HCC)    Echo (2/10) showed EF 55-65%, mild LVH, diastolic dysfunction, mild AS with mean gradient 12 mmHg, PASP 43 mmHg.  Echo (2/12): EF 55-60%, mild LVH, mild AS (mean gradient 12), PA systolic pressure 32 mmHg.     [name] GERD (gastroesophageal reflux disease)   . Heart murmur   . Hyperlipidemia   . Hypertension   . Mild aortic stenosis    mean gradient 12 mmHg in 2/12.  . Myocardial infarction ([place])    "mild"  . Pneumonia   . PONV (postoperative nausea and vomiting)   . Thrombocytopenia ([place])   . Unsteady gait     Past Surgical History:  Procedure Laterality Date  . AV FISTULA PLACEMENT Left  01/30/2015   Procedure: Creation of a Radial Cephalic AV Fistula left wrist;  Surgeon: [name], MD;  Location: [place];  Service: Vascular;  Laterality: Left;  . BACK SURGERY     multiple  . BREAST SURGERY     biopsy  . CARDIAC CATHETERIZATION    . CATARACT EXTRACTION W/ INTRAOCULAR LENS  IMPLANT, BILATERAL    . COLONOSCOPY Left 11/03/2016   Procedure: COLONOSCOPY;  Surgeon: [name], MD;  Location: [facility] ENDOSCOPY;  Service: Endoscopy;  Laterality: Left;  . COLONOSCOPY W/ BIOPSIES AND POLYPECTOMY    . CORONARY ARTERY BYPASS GRAFT  09/2008   pt with NSTEMI and diastolic CHF exacerbation.  LHC was done  3/10 showing 99% pRCA stenosis and 80% calcified pLAD stenosis with L=>R collaterals.  Pt was referred  for CABG which was done by Dr. Prescott Gum with LIMA-LAD, SVG-RCA, SVG-OM.  [name] ESOPHAGOGASTRODUODENOSCOPY N/A 11/02/2016   Procedure: ESOPHAGOGASTRODUODENOSCOPY (EGD);  Surgeon: [name], MD;  Location: [facility] ENDOSCOPY;  Service: Endoscopy;  Laterality: N/A;  . GIVENS CAPSULE STUDY N/A 11/29/2016   Procedure: GIVENS CAPSULE STUDY;  Surgeon: [name], MD;  Location: [place];  Service: Endoscopy;  Laterality: N/A;  . REVISON OF ARTERIOVENOUS FISTULA Left 07/02/2015   Procedure: REVISON OF LEFT RADIOCEPHALIC ARTERIOVENOUS FISTULA;  Surgeon: [name], MD;  Location: [place];  Service: Vascular;  Laterality: Left;     Medications: No outpatient medications have been marked as taking for the 12/19/17 encounter (Appointment) with Jeanne Junes, NP.     Allergies: Allergies  Allergen Reactions  . Lipitor [Atorvastatin] Other (See Comments)    Stomach pain  . Strawberry Extract Rash    Social History: The patient  reports that she has quit smoking. Her smoking use  included cigarettes. She has never used smokeless tobacco. She reports that she drinks alcohol. She reports that she does not use drugs.   Family History: The patient's ***She was adopted. Family history is unknown  by patient.   Review of Systems: Please see the history of present illness.   Otherwise, the review of systems is positive for {NONE DEFAULTED:18576::"none"}.   All other systems are reviewed and negative.   Physical Exam: VS:  There were no vitals taken for this visit. [name]  BMI There is no height or weight on file to calculate BMI.  Wt Readings from Last 3 Encounters:  09/13/17 126 lb 9.6 oz (57.4 kg)  06/27/17 127 lb 12.8 oz (58 kg)  05/10/17 126 lb 11.2 oz (57.5 kg)    General: Pleasant. Well developed, well nourished and in no acute distress.   HEENT: Normal.  Neck: Supple, no JVD, carotid bruits, or masses noted.  Cardiac: ***Regular rate and rhythm. No murmurs, rubs, or gallops. No edema.  Respiratory:  Lungs are clear to auscultation bilaterally with normal work of breathing.  GI: Soft and nontender.  MS: No deformity or atrophy. Gait and ROM intact.  Skin: Warm and dry. Color is normal.  Neuro:  Strength and sensation are intact and no gross focal deficits noted.  Psych: Alert, appropriate and with normal affect.   LABORATORY DATA:  EKG:  EKG {ACTION; IS/IS TDV:[id number]} ordered today. This demonstrates ***.  Lab Results  Component Value Date   WBC 4.3 12/06/2017   HGB 10.2 (L) 12/06/2017   HCT 32.4 (L) 12/06/2017   PLT 81 (L) 12/06/2017   GLUCOSE 112 (H) 11/03/2016   CHOL 99 09/27/2016   TRIG 84 09/27/2016   HDL 51 09/27/2016   LDLDIRECT 34.4 11/17/2008   LDLCALC 31 09/27/2016   ALT 11 (L) 10/31/2016   AST 14 (L) 10/31/2016   NA 137 11/03/2016   K 4.1 11/03/2016   CL 110 11/03/2016   CREATININE 3.62 (H) 11/03/2016   BUN 50 (H) 11/03/2016   CO2 17 (L) 11/03/2016   TSH 2.185 01/17/2014   INR 1.4 10/16/2008   HGBA1C 5.4 09/27/2016     BNP (last 3 results) No results for input(s): BNP in the last 8760 hours.  ProBNP (last 3 results) No results for input(s): PROBNP in the last 8760 hours.   Other Studies Reviewed  Today:   Assessment/Plan:   Current medicines are reviewed with the patient today.  The patient does not have concerns regarding medicines other than what has been noted above.  The following changes have been made:  See above.  Labs/ tests ordered today include:   No orders of the defined types were placed in this encounter.    Disposition:   FU with *** in {gen number 1-06:269485} {Days to years:10300}.   Patient is agreeable to this plan and will call if any problems develop in the interim.   SignedTruitt Merle, NP  12/18/2017 4:35 PM  [facility] [address] [place], [place]  [address] Phone: [phone] Fax: [phone]

## 2017-12-19 ENCOUNTER — Ambulatory Visit: Payer: Medicare HMO | Admitting: Nurse Practitioner

## 2017-12-19 ENCOUNTER — Encounter: Payer: Self-pay | Admitting: Nurse Practitioner

## 2017-12-19 VITALS — BP 116/50 | HR 59 | Ht 68.0 in

## 2017-12-19 DIAGNOSIS — E7800 Pure hypercholesterolemia, unspecified: Secondary | ICD-10-CM

## 2017-12-19 DIAGNOSIS — I10 Essential (primary) hypertension: Secondary | ICD-10-CM | POA: Diagnosis not present

## 2017-12-19 DIAGNOSIS — I259 Chronic ischemic heart disease, unspecified: Secondary | ICD-10-CM

## 2017-12-19 LAB — HEPATIC FUNCTION PANEL
ALT: 12 IU/L (ref 0–32)
AST: 18 IU/L (ref 0–40)
Albumin: 3.8 g/dL (ref 3.5–4.8)
Alkaline Phosphatase: 97 IU/L (ref 39–117)
Bilirubin Total: 0.4 mg/dL (ref 0.0–1.2)
Bilirubin, Direct: 0.19 mg/dL (ref 0.00–0.40)
Total Protein: 6.1 g/dL (ref 6.0–8.5)

## 2017-12-19 LAB — LIPID PANEL
Chol/HDL Ratio: 1.4 ratio (ref 0.0–4.4)
Cholesterol, Total: 81 mg/dL — ABNORMAL LOW (ref 100–199)
HDL: 56 mg/dL (ref >39)
LDL Calculated: 11 mg/dL (ref 0–99)
Triglycerides: 68 mg/dL (ref 0–149)
VLDL Cholesterol Cal: 14 mg/dL (ref 5–40)

## 2017-12-19 NOTE — Patient Instructions (Addendum)
We will be checking the following labs today - lipids and LFTs   Medication Instructions:    Continue with your current medicines.     Testing/Procedures To Be Arranged:  N/A  Follow-Up:   See me in 6 months    Other Special Instructions:   N/A    If you need a refill on your cardiac medications before your next appointment, please call your pharmacy.   Call the [place] office at [phone] if you have any questions, problems or concerns.

## 2017-12-19 NOTE — Progress Notes (Signed)
CARDIOLOGY OFFICE NOTE  Date:  12/19/2017    [name] Date of Birth: Feb 26, 1941 Medical Record #[id number]  PCP:  [name], MD  Cardiologist:  Annabell Howells    Chief Complaint  Patient presents with  . Coronary Artery Disease    6 month check    History of Present Illness: [name] is a 77 y.o. female who presents today for a 6 month check. Seen for Dr. Aundra Dubin.   She has a h/o CAD s/p CABG in 3235 and diastolic CHF. She has chronic anemia, balance issues, HTN, DM, HLD, CKD, chronic back pain, anxiety and known valvular heart disease.   I have seen her several times over the last few years. She is followed by Nephrology in [place] - has seen Dr. Kellie Simmering for AV fistula consult. Kidneys arefailing and she isnearing dialysis.   Seen back in Julyof 2017 - feeling ok. EKG with more changes. Updated her Myoview - see below. Brought back for discussion - opted to avoid cardiac catheterization and continue with medical management.She has had no desire to go on dialysis.  Seen in March of 2018 - she hadbeen in the hospital a few times since I had last seen her.Hadhad a mild stroke the month prior- noted to have right thalamic small infarct due to small vessel disease - carotids with 40 to 59% stenosis, Echo with normal EF, had her dose of aspirin increased to 325 mg. Then she had some major spontaneously resolving GI bleeding and had to havecolonoscopy. Small erosions noted. Negative EGD. She required transfusion with total of 6 units of blood.Aspirinhelduntil seen back by GI. Shewas feeling better clinically. No chest pain noted. Overall prognosis felt to be quite tenuous.  I last saw her in November - she remained off aspirin. Seemed to be holding her own despite numerous co-morbidities.   Comes in today. Here withher husband.She is in a wheelchair today - she broke her kneecap - really does not know how this happened. Asked if she had a fall - she  answers "I don't know". Says it started with pain behind the right knee. It is improving with knee brace and using a walker/wheelchair. No chest pain. Breathing is ok. No syncope. She feels like she is doing ok. Says her kidneys "are stable". Not on dialysis.   Past Medical History:  1. CAD: Pt presented 2/10 to [facility] with NSTEMI and diastolic CHF exacerbation. LHC was done 3/10 showing 99% pRCA stenosis and 80% calcified pLAD stenosis with L=>R collaterals. Pt was referred for CABG which was done by Dr. Prescott Gum with LIMA-LAD, SVG-RCA, SVG-OM.  2. Diastolic CHF: Echo (5/73) showed EF 55-65%, mild LVH, diastolic dysfunction, mild AS with mean gradient 12 mmHg, PASP 43 mmHg. Echo (2/12): EF 55-60%, mild LVH, mild AS (mean gradient 12), PA systolic pressure 32 mmHg. Echo (5/15) with EF 55%, mild MR, mild aortic stenosis.  3. CKD 4. DM2  5. HTN  6. Hyperlipidemia: nausea/GI discomfort with atorvastatin, ? Balance problems with pravastatin.  7. Anxiety  8. Chronic low back pain  9. History of thrombocytopenia  10. 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 Followed by hematology, getting Aranesp injections 11. Aortic stenosis: mean gradient 12 mmHg in 2/12. Mean gradient 18 mmHg with AVA 1.6 cm^2 in 5/15.  12. Carotid stenosis: 40-59% bilateral ICA stenosis in 2/20. 25-42% LICA stenosis in 7/06. Carotids (2/37) with 62-83% LICA stenosis.  13. PAD: ABIs 3/12 were normal  but TBIs were mildly decreased. ABIs/TBIs (10/13) normal.    Past Medical History:  Diagnosis Date  . Anemia    Pt is taking iron.   [name] Anxiety   . Arthritis   . Carotid stenosis    40-59% bilateral ICA stenosis in 2/12.  . Chronic low back pain   . CKD (chronic kidney disease)    Dr. Audie Clear at [facility]  . Coronary artery disease    Pt presented 2/10 to [facility] with NSTEMI and diastolic CHF exacerbation.  LHC was done  3/10 showing 99% pRCA stenosis and 80% calcified pLAD  stenosis with L=>R collaterals.  Pt was referred  for CABG which was done by Dr. Prescott Gum with LIMA-LAD, SVG-RCA, SVG-OM.  . Diabetes mellitus   . Diabetic neuropathy ([place])   . Diastolic CHF ([facility])    Echo (2/10) showed EF 55-65%, mild LVH, diastolic dysfunction, mild AS with mean gradient 12 mmHg, PASP 43 mmHg.  Echo (2/12): EF 55-60%, mild LVH, mild AS (mean gradient 12), PA systolic pressure 32 mmHg.     [name] GERD (gastroesophageal reflux disease)   . Heart murmur   . Hyperlipidemia   . Hypertension   . Mild aortic stenosis    mean gradient 12 mmHg in 2/12.  . Myocardial infarction ([place])    "mild"  . Pneumonia   . PONV (postoperative nausea and vomiting)   . Thrombocytopenia ([place])   . Unsteady gait     Past Surgical History:  Procedure Laterality Date  . AV FISTULA PLACEMENT Left 01/30/2015   Procedure: Creation of a Radial Cephalic AV Fistula left wrist;  Surgeon: [name], MD;  Location: [place];  Service: Vascular;  Laterality: Left;  . BACK SURGERY     multiple  . BREAST SURGERY     biopsy  . CARDIAC CATHETERIZATION    . CATARACT EXTRACTION W/ INTRAOCULAR LENS  IMPLANT, BILATERAL    . COLONOSCOPY Left 11/03/2016   Procedure: COLONOSCOPY;  Surgeon: [name], MD;  Location: [facility] ENDOSCOPY;  Service: Endoscopy;  Laterality: Left;  . COLONOSCOPY W/ BIOPSIES AND POLYPECTOMY    . CORONARY ARTERY BYPASS GRAFT  09/2008   pt with NSTEMI and diastolic CHF exacerbation.  LHC was done  3/10 showing 99% pRCA stenosis and 80% calcified pLAD stenosis with L=>R collaterals.  Pt was referred  for CABG which was done by Dr. Prescott Gum with LIMA-LAD, SVG-RCA, SVG-OM.  [name] ESOPHAGOGASTRODUODENOSCOPY N/A 11/02/2016   Procedure: ESOPHAGOGASTRODUODENOSCOPY (EGD);  Surgeon: [name], MD;  Location: [facility] ENDOSCOPY;  Service: Endoscopy;  Laterality: N/A;  . GIVENS CAPSULE STUDY N/A 11/29/2016   Procedure: GIVENS CAPSULE STUDY;  Surgeon: [name], MD;  Location: [place];  Service: Endoscopy;   Laterality: N/A;  . REVISON OF ARTERIOVENOUS FISTULA Left 07/02/2015   Procedure: REVISON OF LEFT RADIOCEPHALIC ARTERIOVENOUS FISTULA;  Surgeon: [name], MD;  Location: [facility] OR;  Service: Vascular;  Laterality: Left;     Medications: Current Meds  Medication Sig  . ALPRAZolam (XANAX) 1 MG tablet alprazolam 1 mg tablet at bedtime  . AMITIZA 24 MCG capsule   . amLODipine (NORVASC) 5 MG tablet Take 1 tablet (5 mg total) by mouth daily.  . carvedilol (COREG) 12.5 MG tablet TAKE 1 TABLET(12.5 MG) BY MOUTH TWICE DAILY  . Darbepoetin Alfa (ARANESP) 300 MCG/0.6ML SOSY injection Inject 300 mcg into the skin once.  . ergocalciferol (VITAMIN D2) 50000 units capsule Take 50,000 Units by mouth once a week.  . furosemide (  LASIX) 20 MG tablet Take 20 mg by mouth daily. As directed  . losartan (COZAAR) 25 MG tablet Take 1 tablet (25 mg total) by mouth daily.  . rosuvastatin (CRESTOR) 5 MG tablet TAKE 1 TABLET(5 MG) BY MOUTH DAILY  . sevelamer carbonate (RENVELA) 800 MG tablet Take 1,600 mg 3 (three) times daily with meals by mouth.   . sodium bicarbonate 650 MG tablet Take 650 mg by mouth 2 (two) times daily.  [name] trimethoprim (TRIMPEX) 100 MG tablet Take 50 mg by mouth daily.   . VELTASSA 8.4 G packet Take 8.4 g by mouth daily after lunch.   . [DISCONTINUED] ALPRAZolam (XANAX) 0.5 MG tablet Take 0.5 mg by mouth at bedtime.      Allergies: Allergies  Allergen Reactions  . Lipitor [Atorvastatin] Other (See Comments)    Stomach pain  . Strawberry Extract Rash    Social History: The patient  reports that she has quit smoking. Her smoking use included cigarettes. She has never used smokeless tobacco. She reports that she drinks alcohol. She reports that she does not use drugs.   Family History: The patient's She was adopted. Family history is unknown by patient.   Review of Systems: Please see the history of present illness.   Otherwise, the review of systems is positive for none.   All other  systems are reviewed and negative.   Physical Exam: VS:  BP (!) 116/50 (BP Location: Right Arm, Patient Position: Sitting, Cuff Size: Normal)   Pulse (!) 59   Ht 5\' 8"  (1.727 m)   BMI 19.25 kg/m  .  BMI Body mass index is 19.25 kg/m.  Wt Readings from Last 3 Encounters:  09/13/17 126 lb 9.6 oz (57.4 kg)  06/27/17 127 lb 12.8 oz (58 kg)  05/10/17 126 lb 11.2 oz (57.5 kg)    General: Pleasant. Chronically ill. Color chronically sallow. Alert and in no acute distress.  She is not able to weigh today - could not stand.  HEENT: Normal.  Neck: Supple, no JVD, carotid bruits, or masses noted.  Cardiac: Regular rate and rhythm. Soft outflow murmur. Trace edema.  Respiratory:  Lungs are clear to auscultation bilaterally with normal work of breathing.  GI: Soft and nontender.  MS: No deformity or atrophy. Gait not tested.  Skin: Warm and dry. Color is sallow Neuro:  Strength and sensation are intact and no gross focal deficits noted.  Psych: Alert, appropriate and with normal affect.   LABORATORY DATA:  EKG:  EKG is ordered today. This demonstrates sinus brady with septal Q's.  Lab Results  Component Value Date   WBC 4.3 12/06/2017   HGB 10.2 (L) 12/06/2017   HCT 32.4 (L) 12/06/2017   PLT 81 (L) 12/06/2017   GLUCOSE 112 (H) 11/03/2016   CHOL 99 09/27/2016   TRIG 84 09/27/2016   HDL 51 09/27/2016   LDLDIRECT 34.4 11/17/2008   LDLCALC 31 09/27/2016   ALT 11 (L) 10/31/2016   AST 14 (L) 10/31/2016   NA 137 11/03/2016   K 4.1 11/03/2016   CL 110 11/03/2016   CREATININE 3.62 (H) 11/03/2016   BUN 50 (H) 11/03/2016   CO2 17 (L) 11/03/2016   TSH 2.185 01/17/2014   INR 1.4 10/16/2008   HGBA1C 5.4 09/27/2016     BNP (last 3 results) No results for input(s): BNP in the last 8760 hours.  ProBNP (last 3 results) No results for input(s): PROBNP in the last 8760 hours.   Other Studies Reviewed Today:  Myoview Study Highlights 02/2016    Nuclear stress EF: 54%. There is  mid inferior wall hypokinesis  There was no ST segment deviation noted during stress.  Defect 1: There is a medium defect of moderate severity present in the basal inferior and mid inferior location.  The mid inferior wall perfusion defect is reversible consistent with ischemia. The basal inferior wall defect is mostly fixed consistent with infarct.  This is an intermediate risk study. Consider occlusion of SVG to RCA graft.  Candee Furbish, MD   Echo Study Conclusions from 09/2016  - Left ventricle: The cavity size was normal. There was moderate focal basal and mild concentric hypertrophy. Systolic function was normal. The estimated ejection fraction was in the range of 55% to 60%. Wall motion was normal; there were no regional wall motion abnormalities. Features are consistent with a pseudonormal left ventricular filling pattern, with concomitant abnormal relaxation and increased filling pressure (grade 2 diastolic dysfunction). - Aortic valve: The AV appears mildly to moderately stenotic by doppler with a mean AV gradient of 15mmHg. The calculated AVA is 0.9cm2 and likely underestimates with AVA due to inaccurate measurment of the LVOT. Visually there appears to be mild to moderate AS. Valve area (VTI): 0.9 cm^2. Valve area (Vmax): 0.89 cm^2. Valve area (Vmean): 0.86 cm^2. - Mitral valve: There was trivial regurgitation. - Right ventricle: Systolic function was mildly reduced. - Pulmonic valve: There was mild regurgitation. - Pulmonary arteries: PA peak pressure: 33 mm Hg (S).   Carotid Doppler Summary: Findings consistent with a high end 1- 39 percent stenosis involving the right internal carotid artery and the left internal carotid artery. Can not rule out higher grade stenosis due to severe amount of shadowing plaque in bilateral carotid arteries and difficulty visualizing vessels. Severe amount of plaque are subjectively suggestive of a 40-59%  percent stenosis bilaterally. The right vertebral demonstrates atypical flow, left vertebral is patent and antegrade.  Other specific details can be found in the table(s) above. Prepared and Electronically Authenticated by  Antony Contras MD 2018-02-14T09:22:41   Assessment/Plan:   1. CAD - Status post CABG from 2010. Has had prior intermediate Myoview - reviewed previouslywith Dr. Aundra Dubin. She has opted for medical management. She has no active symptoms. She wishes to avoid cardiac cath due to her ESRD. No changes made today.   2. AORTIC STENOSIS  No symptoms -last echo noted - no cardinal symptoms. We discussed repeating the echo - we have both agreed to hold off for now - she does not wish to have cardiac cath - does not wish to be on dialysis - she has no cardinal symptoms - will discuss again on return.   3. Prior stroke - tried to be on higher dose aspirin - this led to major GI bleeding.She remains chronically anemic. Neurologically looks stable.  4. HYPERTENSION - BP looks good on her current regimen. No changes made today.  5. Hyperlipidemia - on statin therapy. Needs lab today. She is fasting.   6. Progressive CKD - followed by Renal in [place]. She says her current situation with her kidneys "is stable".   7.PriorGI bleed:  EGD 3/21 by Dr. Collene Mares negative. Subsequent colonoscopy w/diverticulosis and afew small ulcers/erosions in the sigmoid and ascending colon. She remains off of aspirin.Not wanting to restart. No active bleeding noted at this time.   Current medicines are reviewed with the patient today.  The patient does not have concerns regarding medicines other than what has been noted above.  The following changes have been made:  See above.  Labs/ tests ordered today include:    Orders Placed This Encounter  Procedures  . Hepatic function panel  . Lipid panel  . EKG 12-Lead     Disposition:   FU with me in 6 months. Overall  prognosis tenuous at best.    Patient is agreeable to this plan and will call if any problems develop in the interim.   SignedTruitt Merle, NP  12/19/2017 9:31 AM  [facility] [address] [place], [place]  [address] Phone: [phone] Fax: [phone]

## 2017-12-22 DIAGNOSIS — N189 Chronic kidney disease, unspecified: Secondary | ICD-10-CM | POA: Diagnosis not present

## 2017-12-22 DIAGNOSIS — M899 Disorder of bone, unspecified: Secondary | ICD-10-CM | POA: Diagnosis not present

## 2017-12-22 DIAGNOSIS — N185 Chronic kidney disease, stage 5: Secondary | ICD-10-CM | POA: Diagnosis not present

## 2017-12-26 ENCOUNTER — Other Ambulatory Visit: Payer: Self-pay | Admitting: *Deleted

## 2017-12-26 DIAGNOSIS — N181 Chronic kidney disease, stage 1: Principal | ICD-10-CM

## 2017-12-26 DIAGNOSIS — D631 Anemia in chronic kidney disease: Secondary | ICD-10-CM

## 2017-12-27 ENCOUNTER — Inpatient Hospital Stay: Payer: Medicare HMO

## 2017-12-27 ENCOUNTER — Inpatient Hospital Stay: Payer: Medicare HMO | Attending: Oncology

## 2017-12-27 VITALS — BP 138/58 | HR 57 | Temp 97.5°F | Resp 18

## 2017-12-27 DIAGNOSIS — D696 Thrombocytopenia, unspecified: Secondary | ICD-10-CM

## 2017-12-27 DIAGNOSIS — D631 Anemia in chronic kidney disease: Secondary | ICD-10-CM | POA: Insufficient documentation

## 2017-12-27 DIAGNOSIS — I129 Hypertensive chronic kidney disease with stage 1 through stage 4 chronic kidney disease, or unspecified chronic kidney disease: Secondary | ICD-10-CM | POA: Diagnosis not present

## 2017-12-27 DIAGNOSIS — N189 Chronic kidney disease, unspecified: Secondary | ICD-10-CM | POA: Insufficient documentation

## 2017-12-27 DIAGNOSIS — N181 Chronic kidney disease, stage 1: Principal | ICD-10-CM

## 2017-12-27 LAB — CBC WITH DIFFERENTIAL (CANCER CENTER ONLY)
BASOS PCT: 1 %
Basophils Absolute: 0.1 10*3/uL (ref 0.0–0.1)
EOS ABS: 0.1 10*3/uL (ref 0.0–0.5)
EOS PCT: 3 %
HCT: 33.4 % — ABNORMAL LOW (ref 34.8–46.6)
Hemoglobin: 10.5 g/dL — ABNORMAL LOW (ref 11.6–15.9)
Lymphocytes Relative: 36 %
Lymphs Abs: 1.3 10*3/uL (ref 0.9–3.3)
MCH: 32.1 pg (ref 25.1–34.0)
MCHC: 31.4 g/dL — AB (ref 31.5–36.0)
MCV: 102.1 fL — ABNORMAL HIGH (ref 79.5–101.0)
Monocytes Absolute: 0.5 10*3/uL (ref 0.1–0.9)
Monocytes Relative: 13 %
Neutro Abs: 1.7 10*3/uL (ref 1.5–6.5)
Neutrophils Relative %: 47 %
PLATELETS: 77 10*3/uL — AB (ref 145–400)
RBC: 3.27 MIL/uL — AB (ref 3.70–5.45)
RDW: 16.9 % — ABNORMAL HIGH (ref 11.2–14.5)
WBC: 3.6 10*3/uL — AB (ref 3.9–10.3)

## 2017-12-27 MED ORDER — DARBEPOETIN ALFA 300 MCG/0.6ML IJ SOSY
300.0000 ug | PREFILLED_SYRINGE | Freq: Once | INTRAMUSCULAR | Status: AC
  Administered 2017-12-27: 300 ug via SUBCUTANEOUS

## 2017-12-27 NOTE — Patient Instructions (Signed)

## 2018-01-04 DIAGNOSIS — E875 Hyperkalemia: Secondary | ICD-10-CM | POA: Diagnosis not present

## 2018-01-04 DIAGNOSIS — E872 Acidosis: Secondary | ICD-10-CM | POA: Diagnosis not present

## 2018-01-04 DIAGNOSIS — E1122 Type 2 diabetes mellitus with diabetic chronic kidney disease: Secondary | ICD-10-CM | POA: Diagnosis not present

## 2018-01-04 DIAGNOSIS — D631 Anemia in chronic kidney disease: Secondary | ICD-10-CM | POA: Diagnosis not present

## 2018-01-04 DIAGNOSIS — N185 Chronic kidney disease, stage 5: Secondary | ICD-10-CM | POA: Diagnosis not present

## 2018-01-04 DIAGNOSIS — Z8673 Personal history of transient ischemic attack (TIA), and cerebral infarction without residual deficits: Secondary | ICD-10-CM | POA: Diagnosis not present

## 2018-01-04 DIAGNOSIS — M899 Disorder of bone, unspecified: Secondary | ICD-10-CM | POA: Diagnosis not present

## 2018-01-04 DIAGNOSIS — I2510 Atherosclerotic heart disease of native coronary artery without angina pectoris: Secondary | ICD-10-CM | POA: Diagnosis not present

## 2018-01-04 DIAGNOSIS — I120 Hypertensive chronic kidney disease with stage 5 chronic kidney disease or end stage renal disease: Secondary | ICD-10-CM | POA: Diagnosis not present

## 2018-01-05 DIAGNOSIS — M25561 Pain in right knee: Secondary | ICD-10-CM | POA: Diagnosis not present

## 2018-01-05 DIAGNOSIS — M1711 Unilateral primary osteoarthritis, right knee: Secondary | ICD-10-CM | POA: Diagnosis not present

## 2018-01-16 ENCOUNTER — Other Ambulatory Visit: Payer: Self-pay | Admitting: *Deleted

## 2018-01-16 DIAGNOSIS — D631 Anemia in chronic kidney disease: Secondary | ICD-10-CM

## 2018-01-16 DIAGNOSIS — N183 Chronic kidney disease, stage 3 (moderate): Principal | ICD-10-CM

## 2018-01-17 ENCOUNTER — Inpatient Hospital Stay: Payer: Medicare HMO | Attending: Oncology

## 2018-01-17 ENCOUNTER — Inpatient Hospital Stay: Payer: Medicare HMO

## 2018-01-17 DIAGNOSIS — D631 Anemia in chronic kidney disease: Secondary | ICD-10-CM

## 2018-01-17 DIAGNOSIS — N189 Chronic kidney disease, unspecified: Secondary | ICD-10-CM | POA: Diagnosis present

## 2018-01-17 DIAGNOSIS — N183 Chronic kidney disease, stage 3 unspecified: Secondary | ICD-10-CM

## 2018-01-17 LAB — CBC WITH DIFFERENTIAL (CANCER CENTER ONLY)
BASOS ABS: 0 10*3/uL (ref 0.0–0.1)
Basophils Relative: 1 %
EOS PCT: 1 %
Eosinophils Absolute: 0.1 10*3/uL (ref 0.0–0.5)
HEMATOCRIT: 35.2 % (ref 34.8–46.6)
HEMOGLOBIN: 11.4 g/dL — AB (ref 11.6–15.9)
LYMPHS PCT: 29 %
Lymphs Abs: 1.5 10*3/uL (ref 0.9–3.3)
MCH: 32.1 pg (ref 25.1–34.0)
MCHC: 32.4 g/dL (ref 31.5–36.0)
MCV: 99.2 fL (ref 79.5–101.0)
Monocytes Absolute: 0.8 10*3/uL (ref 0.1–0.9)
Monocytes Relative: 15 %
NEUTROS ABS: 2.7 10*3/uL (ref 1.5–6.5)
Neutrophils Relative %: 54 %
PLATELETS: 71 10*3/uL — AB (ref 145–400)
RBC: 3.55 MIL/uL — AB (ref 3.70–5.45)
RDW: 15.4 % — ABNORMAL HIGH (ref 11.2–14.5)
WBC: 5 10*3/uL (ref 3.9–10.3)

## 2018-01-17 MED ORDER — DARBEPOETIN ALFA 300 MCG/0.6ML IJ SOSY
PREFILLED_SYRINGE | INTRAMUSCULAR | Status: AC
  Filled 2018-01-17: qty 0.6

## 2018-01-17 NOTE — Progress Notes (Signed)
Hemoglobin noted at 11.4  On labs today. No injection needed at this time per orders. Pt and family member given current copy of labs and schedule. Instructed to follow schedule and call our office  If issues occur.

## 2018-02-07 ENCOUNTER — Other Ambulatory Visit: Payer: Self-pay | Admitting: *Deleted

## 2018-02-07 ENCOUNTER — Inpatient Hospital Stay: Payer: Medicare HMO

## 2018-02-07 VITALS — BP 110/67 | HR 58 | Temp 97.5°F | Resp 20

## 2018-02-07 DIAGNOSIS — N189 Chronic kidney disease, unspecified: Secondary | ICD-10-CM

## 2018-02-07 DIAGNOSIS — D696 Thrombocytopenia, unspecified: Secondary | ICD-10-CM

## 2018-02-07 DIAGNOSIS — D631 Anemia in chronic kidney disease: Secondary | ICD-10-CM

## 2018-02-07 DIAGNOSIS — N179 Acute kidney failure, unspecified: Secondary | ICD-10-CM

## 2018-02-07 LAB — CBC WITH DIFFERENTIAL (CANCER CENTER ONLY)
BASOS PCT: 1 %
Basophils Absolute: 0 10*3/uL (ref 0.0–0.1)
EOS ABS: 0.1 10*3/uL (ref 0.0–0.5)
Eosinophils Relative: 2 %
HEMATOCRIT: 28 % — AB (ref 34.8–46.6)
Hemoglobin: 9.1 g/dL — ABNORMAL LOW (ref 11.6–15.9)
Lymphocytes Relative: 30 %
Lymphs Abs: 1.1 10*3/uL (ref 0.9–3.3)
MCH: 32.4 pg (ref 25.1–34.0)
MCHC: 32.7 g/dL (ref 31.5–36.0)
MCV: 99.1 fL (ref 79.5–101.0)
MONO ABS: 0.5 10*3/uL (ref 0.1–0.9)
Monocytes Relative: 13 %
NEUTROS ABS: 2 10*3/uL (ref 1.5–6.5)
Neutrophils Relative %: 54 %
Platelet Count: 69 10*3/uL — ABNORMAL LOW (ref 145–400)
RBC: 2.83 MIL/uL — ABNORMAL LOW (ref 3.70–5.45)
RDW: 15.3 % — AB (ref 11.2–14.5)
WBC Count: 3.7 10*3/uL — ABNORMAL LOW (ref 3.9–10.3)

## 2018-02-07 MED ORDER — DARBEPOETIN ALFA 300 MCG/0.6ML IJ SOSY
300.0000 ug | PREFILLED_SYRINGE | Freq: Once | INTRAMUSCULAR | Status: AC
  Administered 2018-02-07: 300 ug via SUBCUTANEOUS

## 2018-02-07 NOTE — Patient Instructions (Signed)

## 2018-02-19 DIAGNOSIS — N185 Chronic kidney disease, stage 5: Secondary | ICD-10-CM | POA: Diagnosis not present

## 2018-02-23 DIAGNOSIS — Z8673 Personal history of transient ischemic attack (TIA), and cerebral infarction without residual deficits: Secondary | ICD-10-CM | POA: Diagnosis not present

## 2018-02-23 DIAGNOSIS — E875 Hyperkalemia: Secondary | ICD-10-CM | POA: Diagnosis not present

## 2018-02-23 DIAGNOSIS — N2581 Secondary hyperparathyroidism of renal origin: Secondary | ICD-10-CM | POA: Diagnosis not present

## 2018-02-23 DIAGNOSIS — E1122 Type 2 diabetes mellitus with diabetic chronic kidney disease: Secondary | ICD-10-CM | POA: Diagnosis not present

## 2018-02-23 DIAGNOSIS — M899 Disorder of bone, unspecified: Secondary | ICD-10-CM | POA: Diagnosis not present

## 2018-02-23 DIAGNOSIS — N185 Chronic kidney disease, stage 5: Secondary | ICD-10-CM | POA: Diagnosis not present

## 2018-02-23 DIAGNOSIS — I120 Hypertensive chronic kidney disease with stage 5 chronic kidney disease or end stage renal disease: Secondary | ICD-10-CM | POA: Diagnosis not present

## 2018-02-23 DIAGNOSIS — D631 Anemia in chronic kidney disease: Secondary | ICD-10-CM | POA: Diagnosis not present

## 2018-02-23 DIAGNOSIS — I2510 Atherosclerotic heart disease of native coronary artery without angina pectoris: Secondary | ICD-10-CM | POA: Diagnosis not present

## 2018-02-26 DIAGNOSIS — E113593 Type 2 diabetes mellitus with proliferative diabetic retinopathy without macular edema, bilateral: Secondary | ICD-10-CM | POA: Diagnosis not present

## 2018-02-28 ENCOUNTER — Inpatient Hospital Stay: Payer: Medicare HMO

## 2018-02-28 ENCOUNTER — Inpatient Hospital Stay (HOSPITAL_BASED_OUTPATIENT_CLINIC_OR_DEPARTMENT_OTHER): Payer: Medicare HMO | Admitting: Oncology

## 2018-02-28 ENCOUNTER — Telehealth: Payer: Self-pay | Admitting: Oncology

## 2018-02-28 ENCOUNTER — Other Ambulatory Visit: Payer: Self-pay | Admitting: Oncology

## 2018-02-28 ENCOUNTER — Inpatient Hospital Stay: Payer: Medicare HMO | Attending: Oncology

## 2018-02-28 VITALS — BP 110/55 | HR 60 | Temp 97.7°F | Resp 18 | Ht 68.0 in | Wt 118.3 lb

## 2018-02-28 DIAGNOSIS — Z79899 Other long term (current) drug therapy: Secondary | ICD-10-CM | POA: Diagnosis not present

## 2018-02-28 DIAGNOSIS — D696 Thrombocytopenia, unspecified: Secondary | ICD-10-CM

## 2018-02-28 DIAGNOSIS — D631 Anemia in chronic kidney disease: Secondary | ICD-10-CM

## 2018-02-28 DIAGNOSIS — N189 Chronic kidney disease, unspecified: Principal | ICD-10-CM

## 2018-02-28 DIAGNOSIS — E611 Iron deficiency: Secondary | ICD-10-CM

## 2018-02-28 LAB — CBC WITH DIFFERENTIAL (CANCER CENTER ONLY)
BASOS PCT: 1 %
Basophils Absolute: 0 10*3/uL (ref 0.0–0.1)
Eosinophils Absolute: 0.1 10*3/uL (ref 0.0–0.5)
Eosinophils Relative: 2 %
HEMATOCRIT: 28.3 % — AB (ref 34.8–46.6)
Hemoglobin: 8.9 g/dL — ABNORMAL LOW (ref 11.6–15.9)
Lymphocytes Relative: 34 %
Lymphs Abs: 1 10*3/uL (ref 0.9–3.3)
MCH: 32.5 pg (ref 25.1–34.0)
MCHC: 31.4 g/dL — ABNORMAL LOW (ref 31.5–36.0)
MCV: 103.3 fL — AB (ref 79.5–101.0)
MONO ABS: 0.4 10*3/uL (ref 0.1–0.9)
MONOS PCT: 13 %
NEUTROS ABS: 1.5 10*3/uL (ref 1.5–6.5)
Neutrophils Relative %: 50 %
Platelet Count: 72 10*3/uL — ABNORMAL LOW (ref 145–400)
RBC: 2.74 MIL/uL — ABNORMAL LOW (ref 3.70–5.45)
RDW: 17.3 % — AB (ref 11.2–14.5)
WBC Count: 3 10*3/uL — ABNORMAL LOW (ref 3.9–10.3)

## 2018-02-28 MED ORDER — DARBEPOETIN ALFA 300 MCG/0.6ML IJ SOSY
300.0000 ug | PREFILLED_SYRINGE | Freq: Once | INTRAMUSCULAR | Status: AC
  Administered 2018-02-28: 300 ug via SUBCUTANEOUS

## 2018-02-28 NOTE — Progress Notes (Signed)
Hematology and Oncology Follow Up Visit  [name] [id number] [birth year] 77 y.o. 02/28/2018 9:31 AM [name], MDWilson, Jeanne Flavors, MD   Principle Diagnosis: 77 year old woman with:  1. Anemia related to chronic renal insufficiency dating back to 2011.  She has element of iron deficiency as well.  2.  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  Current therapy: Aranesp 300 mcg every 3 weeks keeping hemoglobin above 11.  Packed red cell transfusions have been used in the past periodically.  Interim History:  [name] presents today for a follow-up visit.  Since last visit, she sustained an injury to her right knee which has limited her mobility with a walker and using wheelchair in her appointments.  She continues to receive Aranesp every 3 weeks with reasonable response to therapy and her hemoglobin is up to 11.4 in June 2019.  She denies any recent falls or syncope.  She denies any pathological fractures.  She continues to have issues with renal insufficiency although she has not required dialysis.  She has not had any recent hospitalizations or illnesses.   She has not reported any headaches or blurry vision or double vision.  She denies any confusion or alteration in mental status.  She does not report any fevers or chills or sweats.  She is eating reasonably well although of lost weight.  She does not report any chest pain shortness of breath or difficulty breathing.  He denies any cough wheezing or hemoptysis.  She does not report any nausea or vomiting or abdominal pain. She does not report any frequency urgency or hesitancy. She has not reported any bleeding or clotting tendencies. She has not reported any lymphadenopathy or petechiae. Rest of her review of systems is negative.   Medications: I have reviewed the patient's current medications.  Current Outpatient Medications  Medication Sig Dispense Refill  . ALPRAZolam (XANAX) 1 MG tablet  alprazolam 1 mg tablet at bedtime    . AMITIZA 24 MCG capsule     . amLODipine (NORVASC) 5 MG tablet Take 1 tablet (5 mg total) by mouth daily. 90 tablet 3  . carvedilol (COREG) 12.5 MG tablet TAKE 1 TABLET(12.5 MG) BY MOUTH TWICE DAILY 60 tablet 9  . cholecalciferol (VITAMIN D) 1000 units tablet Take 3,000 Units by mouth daily.    . Darbepoetin Alfa (ARANESP) 300 MCG/0.6ML SOSY injection Inject 300 mcg into the skin once.    . furosemide (LASIX) 20 MG tablet Take 20 mg by mouth daily. As directed    . losartan (COZAAR) 25 MG tablet Take 1 tablet (25 mg total) by mouth daily. 90 tablet 3  . rosuvastatin (CRESTOR) 5 MG tablet TAKE 1 TABLET(5 MG) BY MOUTH DAILY 90 tablet 3  . sevelamer carbonate (RENVELA) 800 MG tablet Take 1,600 mg 3 (three) times daily with meals by mouth.     . sodium bicarbonate 650 MG tablet Take 650 mg by mouth 2 (two) times daily.    [name] trimethoprim (TRIMPEX) 100 MG tablet Take 50 mg by mouth daily.     . VELTASSA 8.4 G packet Take 8.4 g by mouth daily after lunch.      No current facility-administered medications for this visit.      Allergies:  Allergies  Allergen Reactions  . Lipitor [Atorvastatin] Other (See Comments)    Stomach pain  . Strawberry Extract Rash    Past Medical History, Surgical history, Social history, and Family History reviewed and remain unchanged.  Physical Exam: Blood pressure (!) 110/55, pulse 60, temperature 97.7 F (36.5 C), temperature source Oral, resp. rate 18, height 5\' 8"  (1.727 m), weight 118 lb 4.8 oz (53.7 kg), SpO2 98 %.   ECOG: 1 General appearance: Alert, awake woman without distress. Head: Atraumatic without abnormalities. Oropharynx: No oral ulcers or lesions. Eyes: Pupils are equal and round reactive to light. Lymph nodes: No lymphadenopathy noted in the cervical, supraclavicular, or axillary nodes. Heart:regular rate and rhythm, without any murmurs.  No leg edema. Lung: Clear to auscultation without any  rhonchi, wheezes or dullness to percussion. Abdomen: Soft, nontender without any rebound or guarding.  No shifting dullness or ascites. Musculoskeletal: No clubbing or cyanosis. Skin: Ecchymosis noted without any petechiae or rash.    Lab Results: Lab Results  Component Value Date   WBC 3.0 (L) 02/28/2018   HGB 8.9 (L) 02/28/2018   HCT 28.3 (L) 02/28/2018   MCV 103.3 (H) 02/28/2018   PLT 72 (L) 02/28/2018     Chemistry      Component Value Date/Time   NA 137 11/03/2016 0332   NA 136 08/03/2016 1006   K 4.1 11/03/2016 0332   K 5.0 08/03/2016 1006   CL 110 11/03/2016 0332   CL 101 08/16/2012 0757   CO2 17 (L) 11/03/2016 0332   CO2 20 (L) 08/03/2016 1006   BUN 50 (H) 11/03/2016 0332   BUN 44.9 (H) 08/03/2016 1006   CREATININE 3.62 (H) 11/03/2016 0332   CREATININE 3.6 (HH) 08/03/2016 1006      Component Value Date/Time   CALCIUM 8.1 (L) 11/03/2016 0332   CALCIUM 9.3 08/03/2016 1006   ALKPHOS 97 12/19/2017 0947   ALKPHOS 66 08/03/2016 1006   AST 18 12/19/2017 0947   AST 19 08/03/2016 1006   ALT 12 12/19/2017 0947   ALT 15 08/03/2016 1006   BILITOT 0.4 12/19/2017 0947   BILITOT 0.43 08/03/2016 1006      Results for [name] (MRN [id number]) as of 02/28/2018 09:29  Ref. Range 10/25/2017 09:15  Iron Latest Ref Range: 41 - 142 ug/dL 72  UIBC Latest Units: ug/dL 311  TIBC Latest Ref Range: 236 - 444 ug/dL 383  Saturation Ratios Latest Ref Range: 21 - 57 % 19 (L)  Ferritin Latest Ref Range: 9 - 269 ng/mL 24     Impression and Plan:   77 year old woman with:   1.  Multifactorial anemia with anemia of renal disease diagnosed in 2011.  She remains on Aranesp every 3 weeks to get her hemoglobin close to 11.  Her hemoglobin today is 8.9 and she is asymptomatic.  Risks and benefits of continuing Aranesp including today was discussed today and she is agreeable to continue.  We will continue to check iron studies periodically and replace as needed.  2. Thrombocytopenia:  Her platelet count remains stable without any active bleeding.  Thrombocytopenia is related to autoimmune versus reactive etiology.   3. Renal failure: Renal function remains stable without any dialysis need.  4. Follow-up: She will continue to follow-up every 3 weeks for Aranesp and MD follow-up in 6 months.  15  minutes was spent with the patient face-to-face today.  More than 50% of time was dedicated to patient counseling, education and discussing the natural course of her disease as well as future treatment options.       Zola Button 7/17/20199:31 AM

## 2018-02-28 NOTE — Telephone Encounter (Signed)
Scheduled appt per 7/17 los - gave patient aVS and calender per los. -  

## 2018-02-28 NOTE — Patient Instructions (Signed)

## 2018-03-21 ENCOUNTER — Inpatient Hospital Stay: Payer: Medicare HMO | Attending: Oncology

## 2018-03-21 ENCOUNTER — Inpatient Hospital Stay: Payer: Medicare HMO

## 2018-03-21 DIAGNOSIS — N189 Chronic kidney disease, unspecified: Secondary | ICD-10-CM | POA: Diagnosis present

## 2018-03-21 DIAGNOSIS — D696 Thrombocytopenia, unspecified: Secondary | ICD-10-CM

## 2018-03-21 DIAGNOSIS — D631 Anemia in chronic kidney disease: Secondary | ICD-10-CM | POA: Insufficient documentation

## 2018-03-21 LAB — IRON AND TIBC
IRON: 73 ug/dL (ref 41–142)
Saturation Ratios: 22 % (ref 21–57)
TIBC: 335 ug/dL (ref 236–444)
UIBC: 262 ug/dL

## 2018-03-21 LAB — CBC WITH DIFFERENTIAL (CANCER CENTER ONLY)
BASOS ABS: 0 10*3/uL (ref 0.0–0.1)
Basophils Relative: 1 %
EOS PCT: 2 %
Eosinophils Absolute: 0.1 10*3/uL (ref 0.0–0.5)
HCT: 32.6 % — ABNORMAL LOW (ref 34.8–46.6)
Hemoglobin: 10.6 g/dL — ABNORMAL LOW (ref 11.6–15.9)
LYMPHS ABS: 1.1 10*3/uL (ref 0.9–3.3)
LYMPHS PCT: 23 %
MCH: 34.6 pg — ABNORMAL HIGH (ref 25.1–34.0)
MCHC: 32.6 g/dL (ref 31.5–36.0)
MCV: 106 fL — AB (ref 79.5–101.0)
MONO ABS: 0.5 10*3/uL (ref 0.1–0.9)
Monocytes Relative: 11 %
Neutro Abs: 3.2 10*3/uL (ref 1.5–6.5)
Neutrophils Relative %: 63 %
Platelet Count: 98 10*3/uL — ABNORMAL LOW (ref 145–400)
RBC: 3.08 MIL/uL — AB (ref 3.70–5.45)
RDW: 17.1 % — ABNORMAL HIGH (ref 11.2–14.5)
WBC: 5 10*3/uL (ref 3.9–10.3)

## 2018-03-21 LAB — FERRITIN: FERRITIN: 57 ng/mL (ref 11–307)

## 2018-03-21 MED ORDER — DARBEPOETIN ALFA 300 MCG/0.6ML IJ SOSY
300.0000 ug | PREFILLED_SYRINGE | Freq: Once | INTRAMUSCULAR | Status: AC
  Administered 2018-03-21: 300 ug via SUBCUTANEOUS

## 2018-03-21 MED ORDER — DARBEPOETIN ALFA 300 MCG/0.6ML IJ SOSY
PREFILLED_SYRINGE | INTRAMUSCULAR | Status: AC
  Filled 2018-03-21: qty 0.6

## 2018-03-21 NOTE — Progress Notes (Signed)
Pt received injection today

## 2018-03-21 NOTE — Patient Instructions (Signed)

## 2018-04-04 DIAGNOSIS — M899 Disorder of bone, unspecified: Secondary | ICD-10-CM | POA: Diagnosis not present

## 2018-04-04 DIAGNOSIS — N185 Chronic kidney disease, stage 5: Secondary | ICD-10-CM | POA: Diagnosis not present

## 2018-04-04 DIAGNOSIS — N189 Chronic kidney disease, unspecified: Secondary | ICD-10-CM | POA: Diagnosis not present

## 2018-04-06 DIAGNOSIS — M899 Disorder of bone, unspecified: Secondary | ICD-10-CM | POA: Diagnosis not present

## 2018-04-06 DIAGNOSIS — E875 Hyperkalemia: Secondary | ICD-10-CM | POA: Diagnosis not present

## 2018-04-06 DIAGNOSIS — I120 Hypertensive chronic kidney disease with stage 5 chronic kidney disease or end stage renal disease: Secondary | ICD-10-CM | POA: Diagnosis not present

## 2018-04-06 DIAGNOSIS — E872 Acidosis: Secondary | ICD-10-CM | POA: Diagnosis not present

## 2018-04-06 DIAGNOSIS — I2510 Atherosclerotic heart disease of native coronary artery without angina pectoris: Secondary | ICD-10-CM | POA: Diagnosis not present

## 2018-04-06 DIAGNOSIS — N185 Chronic kidney disease, stage 5: Secondary | ICD-10-CM | POA: Diagnosis not present

## 2018-04-06 DIAGNOSIS — E1122 Type 2 diabetes mellitus with diabetic chronic kidney disease: Secondary | ICD-10-CM | POA: Diagnosis not present

## 2018-04-06 DIAGNOSIS — D649 Anemia, unspecified: Secondary | ICD-10-CM | POA: Diagnosis not present

## 2018-04-09 DIAGNOSIS — M25561 Pain in right knee: Secondary | ICD-10-CM | POA: Diagnosis not present

## 2018-04-09 DIAGNOSIS — M1711 Unilateral primary osteoarthritis, right knee: Secondary | ICD-10-CM | POA: Diagnosis not present

## 2018-04-09 DIAGNOSIS — M93261 Osteochondritis dissecans, right knee: Secondary | ICD-10-CM | POA: Diagnosis not present

## 2018-04-11 ENCOUNTER — Inpatient Hospital Stay: Payer: Medicare HMO

## 2018-04-11 DIAGNOSIS — N189 Chronic kidney disease, unspecified: Secondary | ICD-10-CM | POA: Diagnosis not present

## 2018-04-11 DIAGNOSIS — D631 Anemia in chronic kidney disease: Secondary | ICD-10-CM

## 2018-04-11 LAB — CBC WITH DIFFERENTIAL (CANCER CENTER ONLY)
BASOS ABS: 0 10*3/uL (ref 0.0–0.1)
BASOS PCT: 1 %
EOS ABS: 0 10*3/uL (ref 0.0–0.5)
Eosinophils Relative: 0 %
HCT: 35.1 % (ref 34.8–46.6)
Hemoglobin: 11.5 g/dL — ABNORMAL LOW (ref 11.6–15.9)
Lymphocytes Relative: 17 %
Lymphs Abs: 0.9 10*3/uL (ref 0.9–3.3)
MCH: 34.9 pg — ABNORMAL HIGH (ref 25.1–34.0)
MCHC: 32.8 g/dL (ref 31.5–36.0)
MCV: 106.4 fL — ABNORMAL HIGH (ref 79.5–101.0)
MONO ABS: 0.4 10*3/uL (ref 0.1–0.9)
MONOS PCT: 7 %
NEUTROS PCT: 75 %
Neutro Abs: 3.8 10*3/uL (ref 1.5–6.5)
Platelet Count: 96 10*3/uL — ABNORMAL LOW (ref 145–400)
RBC: 3.3 MIL/uL — ABNORMAL LOW (ref 3.70–5.45)
RDW: 15.5 % — AB (ref 11.2–14.5)
WBC Count: 5.1 10*3/uL (ref 3.9–10.3)

## 2018-04-11 MED ORDER — DARBEPOETIN ALFA 300 MCG/0.6ML IJ SOSY
PREFILLED_SYRINGE | INTRAMUSCULAR | Status: AC
  Filled 2018-04-11: qty 0.6

## 2018-04-11 NOTE — Progress Notes (Signed)
PT Hgb not in dosage range. No injection needed at this time. Gave copy of labs to pt

## 2018-04-17 ENCOUNTER — Other Ambulatory Visit: Payer: Self-pay

## 2018-04-17 ENCOUNTER — Inpatient Hospital Stay (HOSPITAL_COMMUNITY)
Admission: EM | Admit: 2018-04-17 | Discharge: 2018-04-23 | DRG: 291 | Disposition: A | Discharge status: Home / Self Care | Payer: Medicare HMO | Source: Ambulatory Visit | Attending: Family Medicine | Admitting: Family Medicine

## 2018-04-17 DIAGNOSIS — I5033 Acute on chronic diastolic (congestive) heart failure: Secondary | ICD-10-CM | POA: Diagnosis present

## 2018-04-17 DIAGNOSIS — F411 Generalized anxiety disorder: Secondary | ICD-10-CM | POA: Diagnosis present

## 2018-04-17 DIAGNOSIS — M1990 Unspecified osteoarthritis, unspecified site: Secondary | ICD-10-CM | POA: Diagnosis present

## 2018-04-17 DIAGNOSIS — Z9841 Cataract extraction status, right eye: Secondary | ICD-10-CM

## 2018-04-17 DIAGNOSIS — I2510 Atherosclerotic heart disease of native coronary artery without angina pectoris: Secondary | ICD-10-CM | POA: Diagnosis present

## 2018-04-17 DIAGNOSIS — E1140 Type 2 diabetes mellitus with diabetic neuropathy, unspecified: Secondary | ICD-10-CM | POA: Diagnosis present

## 2018-04-17 DIAGNOSIS — Z992 Dependence on renal dialysis: Secondary | ICD-10-CM | POA: Diagnosis not present

## 2018-04-17 DIAGNOSIS — D696 Thrombocytopenia, unspecified: Secondary | ICD-10-CM | POA: Diagnosis not present

## 2018-04-17 DIAGNOSIS — N185 Chronic kidney disease, stage 5: Secondary | ICD-10-CM | POA: Diagnosis not present

## 2018-04-17 DIAGNOSIS — E1122 Type 2 diabetes mellitus with diabetic chronic kidney disease: Secondary | ICD-10-CM | POA: Diagnosis present

## 2018-04-17 DIAGNOSIS — R5383 Other fatigue: Secondary | ICD-10-CM | POA: Diagnosis not present

## 2018-04-17 DIAGNOSIS — R899 Unspecified abnormal finding in specimens from other organs, systems and tissues: Secondary | ICD-10-CM | POA: Diagnosis not present

## 2018-04-17 DIAGNOSIS — K219 Gastro-esophageal reflux disease without esophagitis: Secondary | ICD-10-CM | POA: Diagnosis present

## 2018-04-17 DIAGNOSIS — D631 Anemia in chronic kidney disease: Secondary | ICD-10-CM | POA: Diagnosis present

## 2018-04-17 DIAGNOSIS — N186 End stage renal disease: Secondary | ICD-10-CM | POA: Diagnosis present

## 2018-04-17 DIAGNOSIS — Z888 Allergy status to other drugs, medicaments and biological substances status: Secondary | ICD-10-CM

## 2018-04-17 DIAGNOSIS — Z8673 Personal history of transient ischemic attack (TIA), and cerebral infarction without residual deficits: Secondary | ICD-10-CM

## 2018-04-17 DIAGNOSIS — I10 Essential (primary) hypertension: Secondary | ICD-10-CM | POA: Diagnosis present

## 2018-04-17 DIAGNOSIS — Z681 Body mass index (BMI) 19 or less, adult: Secondary | ICD-10-CM

## 2018-04-17 DIAGNOSIS — I252 Old myocardial infarction: Secondary | ICD-10-CM

## 2018-04-17 DIAGNOSIS — I120 Hypertensive chronic kidney disease with stage 5 chronic kidney disease or end stage renal disease: Secondary | ICD-10-CM | POA: Diagnosis not present

## 2018-04-17 DIAGNOSIS — I350 Nonrheumatic aortic (valve) stenosis: Secondary | ICD-10-CM | POA: Diagnosis present

## 2018-04-17 DIAGNOSIS — I132 Hypertensive heart and chronic kidney disease with heart failure and with stage 5 chronic kidney disease, or end stage renal disease: Principal | ICD-10-CM | POA: Diagnosis present

## 2018-04-17 DIAGNOSIS — Z9842 Cataract extraction status, left eye: Secondary | ICD-10-CM

## 2018-04-17 DIAGNOSIS — Z91018 Allergy to other foods: Secondary | ICD-10-CM

## 2018-04-17 DIAGNOSIS — E43 Unspecified severe protein-calorie malnutrition: Secondary | ICD-10-CM | POA: Diagnosis present

## 2018-04-17 DIAGNOSIS — Z951 Presence of aortocoronary bypass graft: Secondary | ICD-10-CM

## 2018-04-17 DIAGNOSIS — Z87891 Personal history of nicotine dependence: Secondary | ICD-10-CM

## 2018-04-17 DIAGNOSIS — L8990 Pressure ulcer of unspecified site, unspecified stage: Secondary | ICD-10-CM | POA: Diagnosis present

## 2018-04-17 DIAGNOSIS — M545 Low back pain: Secondary | ICD-10-CM | POA: Diagnosis present

## 2018-04-17 DIAGNOSIS — E785 Hyperlipidemia, unspecified: Secondary | ICD-10-CM | POA: Diagnosis present

## 2018-04-17 DIAGNOSIS — Z961 Presence of intraocular lens: Secondary | ICD-10-CM | POA: Diagnosis present

## 2018-04-17 DIAGNOSIS — T82898A Other specified complication of vascular prosthetic devices, implants and grafts, initial encounter: Secondary | ICD-10-CM | POA: Diagnosis not present

## 2018-04-17 DIAGNOSIS — L89312 Pressure ulcer of right buttock, stage 2: Secondary | ICD-10-CM | POA: Diagnosis present

## 2018-04-17 DIAGNOSIS — R69 Illness, unspecified: Secondary | ICD-10-CM | POA: Diagnosis not present

## 2018-04-17 DIAGNOSIS — G8929 Other chronic pain: Secondary | ICD-10-CM | POA: Diagnosis present

## 2018-04-17 DIAGNOSIS — R799 Abnormal finding of blood chemistry, unspecified: Secondary | ICD-10-CM | POA: Diagnosis not present

## 2018-04-17 DIAGNOSIS — E872 Acidosis: Secondary | ICD-10-CM | POA: Diagnosis present

## 2018-04-17 LAB — CBC
HEMATOCRIT: 39.7 % (ref 36.0–46.0)
Hemoglobin: 12.6 g/dL (ref 12.0–15.0)
MCH: 34.6 pg — AB (ref 26.0–34.0)
MCHC: 31.7 g/dL (ref 30.0–36.0)
MCV: 109.1 fL — AB (ref 78.0–100.0)
Platelets: 107 10*3/uL — ABNORMAL LOW (ref 150–400)
RBC: 3.64 MIL/uL — ABNORMAL LOW (ref 3.87–5.11)
RDW: 13.4 % (ref 11.5–15.5)
WBC: 6.6 10*3/uL (ref 4.0–10.5)

## 2018-04-17 LAB — BASIC METABOLIC PANEL
ANION GAP: 12 (ref 5–15)
BUN: 105 mg/dL — ABNORMAL HIGH (ref 8–23)
CO2: 19 mmol/L — AB (ref 22–32)
Calcium: 9.2 mg/dL (ref 8.9–10.3)
Chloride: 101 mmol/L (ref 98–111)
Creatinine, Ser: 4.94 mg/dL — ABNORMAL HIGH (ref 0.44–1.00)
GFR calc Af Amer: 9 mL/min — ABNORMAL LOW (ref >60)
GFR calc non Af Amer: 8 mL/min — ABNORMAL LOW (ref >60)
Glucose, Bld: 159 mg/dL — ABNORMAL HIGH (ref 70–99)
POTASSIUM: 5 mmol/L (ref 3.5–5.1)
Sodium: 132 mmol/L — ABNORMAL LOW (ref 135–145)

## 2018-04-17 NOTE — ED Notes (Signed)
Called pt again for triage, no answer in lobby

## 2018-04-17 NOTE — ED Notes (Signed)
Pt back in waiting room 

## 2018-04-17 NOTE — ED Triage Notes (Signed)
Patient here sent by PCP for dialysis.  Said that PCP told her to come here for emergent dialysis due to labs drawn today.  Patient went to [place] in the hospital prior to coming to triage.  No complaints of chest pain or shortness of breath.

## 2018-04-17 NOTE — ED Notes (Signed)
ED Provider at bedside. 

## 2018-04-17 NOTE — ED Notes (Signed)
Pt went to [employer] and has not returned. Called pt for triage x2

## 2018-04-17 NOTE — ED Provider Notes (Signed)
Patient placed in Quick Look pathway, seen and evaluated   Chief Complaint: abnormal lab  HPI:   Patient is a 77 year old female with a history of CKD who presents the emergency department today for evaluation after she was noted to have an abnormal lab per her PCP.  Patient states that she had fistula placed 2 years ago in anticipation that she would need dialysis however she has not yet started.  She had labs drawn by PCP and was told that her kidney function is worsening and she should go to the ED for evaluation of potential dialysis.  Patient states that she has had generalized weakness for the last several days and has felt somewhat confused otherwise she has had no other symptoms.  ROS: Abnormal lab (one)  Physical Exam:   Gen: No distress  Neuro: Awake and Alert  Skin: Warm    Focused Exam: CTAB. RRR. Answers questions appropriately.    Initiation of care has begun. The patient has been counseled on the process, plan, and necessity for staying for the completion/evaluation, and the remainder of the medical screening examination  Pt advised to inform nursing staff immediately if they experience any new or worsening of symptoms while waiting in the waiting room.    Rodney Booze, PA-C 04/17/18 1801    Carmin Muskrat, MD 04/18/18 [phone]

## 2018-04-17 NOTE — ED Notes (Signed)
Pt son requesting to speak with director due to pts wait time tonight. AD Darci Current made aware and will come speak with the family.

## 2018-04-17 NOTE — ED Provider Notes (Signed)
[place] EMERGENCY DEPARTMENT Provider Note   CSN: [id number] Arrival date & time: 04/17/18  1558     History   Chief Complaint Chief Complaint  Patient presents with  . Abnormal Lab    from [facility]     HPI [name] is a 77 y.o. female.  The history is provided by the patient and medical records.  Abnormal Lab     77 y.o. F with hx of anemia, anxiety, CKD followed by nephrology, CAD, DM, neuropathy, CHF, HLP, HTN, aortic stenosis, presenting to the ED for abnormal labs.  Patient reports she has had poor kidney function for the past several years.  She had fistula placed in left forearm about 2 years ago but is never been used up until this point.  States she had recent follow-up with a nephrologist and had plan to get set up with nephrology center, however has not been successful with this yet.  Patient recently changed her mind and decided she wanted to do dialysis in [place], but has not yet been able to establish with [place] [facility].  Patient reports she recently had labs drawn and was notified by her nephrologist today that she needed to come in for emergent dialysis as her numbers look significantly worse.  Patient reports she does feel very fatigued with low energy pretty much all the time now.  She still makes lots of urine, no difficulty urinating.  She has not had any chest pain or shortness of breath.  Past Medical History:  Diagnosis Date  . Anemia    Pt is taking iron.   [name] Anxiety   . Arthritis   . Carotid stenosis    40-59% bilateral ICA stenosis in 2/12.  . Chronic low back pain   . CKD (chronic kidney disease)    Dr. Audie Clear at [facility]  . Coronary artery disease    Pt presented 2/10 to [facility] with NSTEMI and diastolic CHF exacerbation.  LHC was done  3/10 showing 99% pRCA stenosis and 80% calcified pLAD stenosis with L=>R collaterals.  Pt was referred  for CABG which was done by Dr. Prescott Gum with LIMA-LAD, SVG-RCA,  SVG-OM.  . Diabetes mellitus   . Diabetic neuropathy ([place])   . Diastolic CHF (HCC)    Echo (2/10) showed EF 55-65%, mild LVH, diastolic dysfunction, mild AS with mean gradient 12 mmHg, PASP 43 mmHg.  Echo (2/12): EF 55-60%, mild LVH, mild AS (mean gradient 12), PA systolic pressure 32 mmHg.     [name] GERD (gastroesophageal reflux disease)   . Heart murmur   . Hyperlipidemia   . Hypertension   . Mild aortic stenosis    mean gradient 12 mmHg in 2/12.  . Myocardial infarction ([place])    "mild"  . Pneumonia   . PONV (postoperative nausea and vomiting)   . Thrombocytopenia ([place])   . Unsteady gait     Patient Active Problem List   Diagnosis Date Noted  . GI bleed 10/31/2016  . Blood loss anemia 10/31/2016  . AKI (acute kidney injury) ([place]) 10/31/2016  . CVA (cerebral vascular accident) ([place]) 09/26/2016  . End stage renal disease ([place]) 06/23/2015  . Chronic kidney disease (CKD), stage IV (severe) ([place]) 06/23/2015  . Balance problem 12/22/2013  . Edema 12/28/2011  . CLAUDICATION 11/01/2010  . CAROTID ARTERY STENOSIS 10/29/2009  . AORTIC STENOSIS 09/17/2009  . Essential hypertension 11/17/2008  . DIASTOLIC HEART FAILURE, CHRONIC 11/17/2008  . DIAB W/UNS COMP TYPE II/UNS NOT  STATED UNCNTRL 11/12/2008  . Other and unspecified hyperlipidemia 11/12/2008  . Anemia, chronic renal failure 11/12/2008  . Thrombocytopenia ([place]) 11/12/2008  . Anxiety state 11/12/2008  . Coronary atherosclerosis 11/12/2008  . UNSPEC COMBINED SYSTOLIC&DIASTOLIC HEART FAILURE 78/29/5621  . ESOPHAGEAL REFLUX 11/12/2008  . Chronic kidney disease 11/12/2008  . LOW BACK PAIN, CHRONIC 11/12/2008  . INSOMNIA UNSPECIFIED 11/12/2008    Past Surgical History:  Procedure Laterality Date  . AV FISTULA PLACEMENT Left 01/30/2015   Procedure: Creation of a Radial Cephalic AV Fistula left wrist;  Surgeon: [name], MD;  Location: [place];  Service: Vascular;  Laterality: Left;  . BACK SURGERY     multiple  . BREAST  SURGERY     biopsy  . CARDIAC CATHETERIZATION    . CATARACT EXTRACTION W/ INTRAOCULAR LENS  IMPLANT, BILATERAL    . COLONOSCOPY Left 11/03/2016   Procedure: COLONOSCOPY;  Surgeon: [name], MD;  Location: [facility] ENDOSCOPY;  Service: Endoscopy;  Laterality: Left;  . COLONOSCOPY W/ BIOPSIES AND POLYPECTOMY    . CORONARY ARTERY BYPASS GRAFT  09/2008   pt with NSTEMI and diastolic CHF exacerbation.  LHC was done  3/10 showing 99% pRCA stenosis and 80% calcified pLAD stenosis with L=>R collaterals.  Pt was referred  for CABG which was done by Dr. Prescott Gum with LIMA-LAD, SVG-RCA, SVG-OM.  [name] ESOPHAGOGASTRODUODENOSCOPY N/A 11/02/2016   Procedure: ESOPHAGOGASTRODUODENOSCOPY (EGD);  Surgeon: [name], MD;  Location: [facility] ENDOSCOPY;  Service: Endoscopy;  Laterality: N/A;  . GIVENS CAPSULE STUDY N/A 11/29/2016   Procedure: GIVENS CAPSULE STUDY;  Surgeon: [name], MD;  Location: [place];  Service: Endoscopy;  Laterality: N/A;  . REVISON OF ARTERIOVENOUS FISTULA Left 07/02/2015   Procedure: REVISON OF LEFT RADIOCEPHALIC ARTERIOVENOUS FISTULA;  Surgeon: [name], MD;  Location: [place];  Service: Vascular;  Laterality: Left;     OB History   None      Home Medications    Prior to Admission medications   Medication Sig Start Date End Date Taking? Authorizing Provider  ALPRAZolam Duanne Moron) 1 MG tablet Take 1 mg by mouth at bedtime.  11/18/17  Yes [provider]  AMITIZA 24 MCG capsule Take 24 mcg by mouth 2 (two) times daily with a meal.  06/05/17  Yes [provider]  amLODipine (NORVASC) 5 MG tablet Take 1 tablet (5 mg total) by mouth daily. 01/20/15  Yes [name], NP  calcitRIOL (ROCALTROL) 0.5 MCG capsule Take 0.5 mcg by mouth daily. 04/09/18  Yes [provider]  carvedilol (COREG) 12.5 MG tablet TAKE 1 TABLET(12.5 MG) BY MOUTH TWICE DAILY Patient taking differently: Take 12.5 mg by mouth 2 (two) times daily with a meal.  03/14/16  Yes [name], NP    furosemide (LASIX) 20 MG tablet Take 20 mg by mouth daily.  12/17/14  Yes [provider]  losartan (COZAAR) 25 MG tablet Take 1 tablet (25 mg total) by mouth daily. 04/22/14  Yes Larey Dresser, MD  rosuvastatin (CRESTOR) 5 MG tablet TAKE 1 TABLET(5 MG) BY MOUTH DAILY Patient taking differently: Take 5 mg by mouth at bedtime.  06/27/17  Yes [name], NP  sevelamer carbonate (RENVELA) 800 MG tablet Take 1,600 mg 3 (three) times daily with meals by mouth.  12/06/16  Yes [provider]  sodium bicarbonate 650 MG tablet Take 650 mg by mouth 2 (two) times daily.   Yes [provider]  trimethoprim (TRIMPEX) 100 MG tablet Take 50  mg by mouth daily.  11/07/14  Yes [provider]  VELTASSA 8.4 G packet Take 8.4 g by mouth daily after lunch.  05/28/15  Yes [provider]  metoprolol succinate (TOPROL-XL) 50 MG 24 hr tablet Take 50 mg by mouth 2 (two) times daily. Take with or immediately following a meal.  10/24/11  [provider]    Family History Family History  Adopted: Yes  Family history unknown: Yes    Social History Social History   Tobacco Use  . Smoking status: Former Smoker    Types: Cigarettes  . Smokeless tobacco: Never Used  . Tobacco comment: quit 1988  Substance Use Topics  . Alcohol use: Yes    Comment: beer daily  . Drug use: No     Allergies   Lipitor [atorvastatin] and Strawberry extract   Review of Systems Review of Systems  Constitutional: Positive for fatigue.       Abnormal labs  All other systems reviewed and are negative.    Physical Exam Updated Vital Signs BP (!) 176/53   Pulse 62   Temp 98.6 F (37 C) (Oral)   Resp 16   SpO2 98%   Physical Exam  Constitutional: She is oriented to person, place, and time. She appears well-developed and well-nourished.  Reading book, NAD  HENT:  Head: Normocephalic and atraumatic.  Mouth/Throat: Oropharynx is clear and moist.  Eyes: Pupils are  equal, round, and reactive to light. Conjunctivae and EOM are normal.  Neck: Normal range of motion.  Cardiovascular: Normal rate and regular rhythm.  Murmur heard. Pulmonary/Chest: Effort normal and breath sounds normal. No stridor. No respiratory distress.  Abdominal: Soft. Bowel sounds are normal.  Musculoskeletal: Normal range of motion.  Fistula left volar forearm, thrill present  Neurological: She is alert and oriented to person, place, and time.  Skin: Skin is warm and dry.  Psychiatric: She has a normal mood and affect.  Nursing note and vitals reviewed.    ED Treatments / Results  Labs (all labs ordered are listed, but only abnormal results are displayed) Labs Reviewed  CBC - Abnormal; Notable for the following components:      Result Value   RBC 3.64 (*)    MCV 109.1 (*)    MCH 34.6 (*)    Platelets 107 (*)    All other components within normal limits  BASIC METABOLIC PANEL - Abnormal; Notable for the following components:   Sodium 132 (*)    CO2 19 (*)    Glucose, Bld 159 (*)    BUN 105 (*)    Creatinine, Ser 4.94 (*)    GFR calc non Af Amer 8 (*)    GFR calc Af Amer 9 (*)    All other components within normal limits    EKG None  Radiology No results found.  Procedures Procedures (including critical care time)  Medications Ordered in ED Medications - No data to display   Initial Impression / Assessment and Plan / ED Course  I have reviewed the triage vital signs and the nursing notes.  Pertinent labs & imaging results that were available during my care of the patient were reviewed by me and considered in my medical decision making (see chart for details).  77 year old female here with abnormal labs.  Has long-standing history of chronic kidney disease, prepped for dialysis.  Has been following with nephrology in [facility].  Has desire to have her dialysis here in [place], has not yet been  seen by [facility] to set this up.  Patient's labs  here are consistent with severe CKD, potassium is normal at 5.  On chart review, it does not appear that these labs are significantly abnormal from prior as her creatinine on 8/21 was 4.85, today is 4.94.  She denies any chest pain or shortness of breath.  Continues urinating normally.  Does report some fatigue which is unchanged.  Patient has fistula in left volar forearm has thrill and appears ready for dialysis if needed.  Based on her lab findings today, it does not appear that she needs emergent dialysis but likely would benefit from being evaluated/set up with [facility] so she may begin treatment on an outpatient basis.  Discussed with Dr. Shanon Rosser-- agrees that patient does not require emergent dialysis, however recommends admission so they can see her in the morning and get her set up for OP dialysis.  Patient updated, she is agreeable to plan.  Discussed with hospitalist, Dr. Myna Hidalgo-- will admit for ongoing care.    Final Clinical Impressions(s) / ED Diagnoses   Final diagnoses:  Abnormal laboratory test result  Stage 5 chronic kidney disease not on chronic dialysis [facility])    ED Discharge Orders    None       Larene Pickett, PA-C 04/18/18 0055    Merryl Hacker, MD 04/18/18 [id number]

## 2018-04-18 ENCOUNTER — Encounter (HOSPITAL_COMMUNITY): Payer: Self-pay | Admitting: Family Medicine

## 2018-04-18 ENCOUNTER — Other Ambulatory Visit: Payer: Self-pay

## 2018-04-18 DIAGNOSIS — Z992 Dependence on renal dialysis: Secondary | ICD-10-CM

## 2018-04-18 DIAGNOSIS — E872 Acidosis: Secondary | ICD-10-CM | POA: Diagnosis not present

## 2018-04-18 DIAGNOSIS — M1990 Unspecified osteoarthritis, unspecified site: Secondary | ICD-10-CM | POA: Diagnosis present

## 2018-04-18 DIAGNOSIS — I120 Hypertensive chronic kidney disease with stage 5 chronic kidney disease or end stage renal disease: Secondary | ICD-10-CM | POA: Diagnosis not present

## 2018-04-18 DIAGNOSIS — N186 End stage renal disease: Secondary | ICD-10-CM | POA: Diagnosis not present

## 2018-04-18 DIAGNOSIS — F411 Generalized anxiety disorder: Secondary | ICD-10-CM | POA: Diagnosis not present

## 2018-04-18 DIAGNOSIS — I350 Nonrheumatic aortic (valve) stenosis: Secondary | ICD-10-CM | POA: Diagnosis not present

## 2018-04-18 DIAGNOSIS — Z961 Presence of intraocular lens: Secondary | ICD-10-CM | POA: Diagnosis present

## 2018-04-18 DIAGNOSIS — I10 Essential (primary) hypertension: Secondary | ICD-10-CM | POA: Diagnosis not present

## 2018-04-18 DIAGNOSIS — K219 Gastro-esophageal reflux disease without esophagitis: Secondary | ICD-10-CM | POA: Diagnosis present

## 2018-04-18 DIAGNOSIS — D631 Anemia in chronic kidney disease: Secondary | ICD-10-CM | POA: Diagnosis not present

## 2018-04-18 DIAGNOSIS — Z8673 Personal history of transient ischemic attack (TIA), and cerebral infarction without residual deficits: Secondary | ICD-10-CM | POA: Diagnosis not present

## 2018-04-18 DIAGNOSIS — I252 Old myocardial infarction: Secondary | ICD-10-CM | POA: Diagnosis not present

## 2018-04-18 DIAGNOSIS — I2510 Atherosclerotic heart disease of native coronary artery without angina pectoris: Secondary | ICD-10-CM | POA: Diagnosis not present

## 2018-04-18 DIAGNOSIS — D696 Thrombocytopenia, unspecified: Secondary | ICD-10-CM | POA: Diagnosis not present

## 2018-04-18 DIAGNOSIS — N19 Unspecified kidney failure: Secondary | ICD-10-CM | POA: Diagnosis not present

## 2018-04-18 DIAGNOSIS — Z9841 Cataract extraction status, right eye: Secondary | ICD-10-CM | POA: Diagnosis not present

## 2018-04-18 DIAGNOSIS — R899 Unspecified abnormal finding in specimens from other organs, systems and tissues: Secondary | ICD-10-CM | POA: Diagnosis present

## 2018-04-18 DIAGNOSIS — I132 Hypertensive heart and chronic kidney disease with heart failure and with stage 5 chronic kidney disease, or end stage renal disease: Secondary | ICD-10-CM | POA: Diagnosis not present

## 2018-04-18 DIAGNOSIS — I5033 Acute on chronic diastolic (congestive) heart failure: Secondary | ICD-10-CM | POA: Diagnosis not present

## 2018-04-18 DIAGNOSIS — E43 Unspecified severe protein-calorie malnutrition: Secondary | ICD-10-CM | POA: Diagnosis not present

## 2018-04-18 DIAGNOSIS — E1122 Type 2 diabetes mellitus with diabetic chronic kidney disease: Secondary | ICD-10-CM | POA: Diagnosis present

## 2018-04-18 DIAGNOSIS — T82898A Other specified complication of vascular prosthetic devices, implants and grafts, initial encounter: Secondary | ICD-10-CM | POA: Diagnosis not present

## 2018-04-18 DIAGNOSIS — E785 Hyperlipidemia, unspecified: Secondary | ICD-10-CM | POA: Diagnosis not present

## 2018-04-18 DIAGNOSIS — G8929 Other chronic pain: Secondary | ICD-10-CM | POA: Diagnosis present

## 2018-04-18 DIAGNOSIS — L8990 Pressure ulcer of unspecified site, unspecified stage: Secondary | ICD-10-CM | POA: Diagnosis present

## 2018-04-18 DIAGNOSIS — M545 Low back pain: Secondary | ICD-10-CM | POA: Diagnosis present

## 2018-04-18 DIAGNOSIS — Z9842 Cataract extraction status, left eye: Secondary | ICD-10-CM | POA: Diagnosis not present

## 2018-04-18 DIAGNOSIS — Z951 Presence of aortocoronary bypass graft: Secondary | ICD-10-CM | POA: Diagnosis not present

## 2018-04-18 DIAGNOSIS — R69 Illness, unspecified: Secondary | ICD-10-CM | POA: Diagnosis not present

## 2018-04-18 DIAGNOSIS — Z681 Body mass index (BMI) 19 or less, adult: Secondary | ICD-10-CM | POA: Diagnosis not present

## 2018-04-18 DIAGNOSIS — E1140 Type 2 diabetes mellitus with diabetic neuropathy, unspecified: Secondary | ICD-10-CM | POA: Diagnosis present

## 2018-04-18 LAB — BASIC METABOLIC PANEL
ANION GAP: 8 (ref 5–15)
BUN: 105 mg/dL — AB (ref 8–23)
CO2: 17 mmol/L — AB (ref 22–32)
Calcium: 9 mg/dL (ref 8.9–10.3)
Chloride: 108 mmol/L (ref 98–111)
Creatinine, Ser: 4.8 mg/dL — ABNORMAL HIGH (ref 0.44–1.00)
GFR calc Af Amer: 9 mL/min — ABNORMAL LOW (ref >60)
GFR calc non Af Amer: 8 mL/min — ABNORMAL LOW (ref >60)
GLUCOSE: 112 mg/dL — AB (ref 70–99)
POTASSIUM: 4.5 mmol/L (ref 3.5–5.1)
Sodium: 133 mmol/L — ABNORMAL LOW (ref 135–145)

## 2018-04-18 LAB — PROTIME-INR
INR: 1.07
Prothrombin Time: 13.8 seconds (ref 11.4–15.2)

## 2018-04-18 MED ORDER — LIDOCAINE-PRILOCAINE 2.5-2.5 % EX CREA: TOPICAL_CREAM | Freq: Once | CUTANEOUS | Status: DC

## 2018-04-18 MED ORDER — CHLORHEXIDINE GLUCONATE CLOTH 2 % EX PADS
6.0000 | MEDICATED_PAD | Freq: Every day | CUTANEOUS | Status: DC
  Administered 2018-04-19: 6 via TOPICAL

## 2018-04-18 MED ORDER — LUBIPROSTONE 24 MCG PO CAPS
24.0000 ug | ORAL_CAPSULE | Freq: Two times a day (BID) | ORAL | Status: DC
  Administered 2018-04-18 – 2018-04-22 (×8): 24 ug via ORAL
  Filled 2018-04-18 (×10): qty 1

## 2018-04-18 MED ORDER — ALPRAZOLAM 0.5 MG PO TABS
1.0000 mg | ORAL_TABLET | Freq: Every evening | ORAL | Status: DC | PRN
  Administered 2018-04-18: 1 mg via ORAL
  Filled 2018-04-18: qty 2

## 2018-04-18 MED ORDER — SODIUM BICARBONATE 650 MG PO TABS
650.0000 mg | ORAL_TABLET | Freq: Two times a day (BID) | ORAL | Status: DC
  Administered 2018-04-18 – 2018-04-19 (×4): 650 mg via ORAL
  Filled 2018-04-18 (×4): qty 1

## 2018-04-18 MED ORDER — SEVELAMER CARBONATE 800 MG PO TABS
1600.0000 mg | ORAL_TABLET | Freq: Three times a day (TID) | ORAL | Status: DC
  Administered 2018-04-18 – 2018-04-23 (×13): 1600 mg via ORAL
  Filled 2018-04-18 (×13): qty 2

## 2018-04-18 MED ORDER — TRIMETHOPRIM 100 MG PO TABS
50.0000 mg | ORAL_TABLET | Freq: Every day | ORAL | Status: DC
  Administered 2018-04-18 – 2018-04-23 (×5): 50 mg via ORAL
  Filled 2018-04-18 (×7): qty 1

## 2018-04-18 MED ORDER — ALPRAZOLAM 0.5 MG PO TABS
1.0000 mg | ORAL_TABLET | Freq: Every day | ORAL | Status: DC
  Administered 2018-04-19 – 2018-04-22 (×5): 1 mg via ORAL
  Filled 2018-04-18 (×6): qty 2

## 2018-04-18 MED ORDER — ACETAMINOPHEN 325 MG PO TABS
650.0000 mg | ORAL_TABLET | Freq: Four times a day (QID) | ORAL | Status: DC | PRN
  Filled 2018-04-18: qty 2

## 2018-04-18 MED ORDER — SODIUM CHLORIDE 0.9 % IV SOLN: 250.0000 mL | INTRAVENOUS | Status: DC | PRN

## 2018-04-18 MED ORDER — CEFAZOLIN SODIUM-DEXTROSE 2-4 GM/100ML-% IV SOLN
2.0000 g | INTRAVENOUS | Status: AC
  Filled 2018-04-18: qty 100

## 2018-04-18 MED ORDER — FUROSEMIDE 20 MG PO TABS
20.0000 mg | ORAL_TABLET | Freq: Every day | ORAL | Status: DC
  Administered 2018-04-18 – 2018-04-19 (×2): 20 mg via ORAL
  Filled 2018-04-18 (×2): qty 1

## 2018-04-18 MED ORDER — HYDRALAZINE HCL 20 MG/ML IJ SOLN: 10.0000 mg | INTRAMUSCULAR | Status: DC | PRN

## 2018-04-18 MED ORDER — LOSARTAN POTASSIUM 25 MG PO TABS
25.0000 mg | ORAL_TABLET | Freq: Every day | ORAL | Status: DC
  Administered 2018-04-19 – 2018-04-23 (×4): 25 mg via ORAL
  Filled 2018-04-18 (×4): qty 1

## 2018-04-18 MED ORDER — CEFAZOLIN SODIUM-DEXTROSE 2-4 GM/100ML-% IV SOLN
2.0000 g | INTRAVENOUS | Status: DC
  Filled 2018-04-18: qty 100

## 2018-04-18 MED ORDER — SENNOSIDES-DOCUSATE SODIUM 8.6-50 MG PO TABS: 1.0000 | ORAL_TABLET | Freq: Every evening | ORAL | Status: DC | PRN

## 2018-04-18 MED ORDER — AMLODIPINE BESYLATE 5 MG PO TABS
5.0000 mg | ORAL_TABLET | Freq: Every day | ORAL | Status: DC
  Administered 2018-04-19 – 2018-04-23 (×4): 5 mg via ORAL
  Filled 2018-04-18 (×4): qty 1

## 2018-04-18 MED ORDER — CARVEDILOL 12.5 MG PO TABS
12.5000 mg | ORAL_TABLET | Freq: Two times a day (BID) | ORAL | Status: DC
  Administered 2018-04-18 – 2018-04-23 (×8): 12.5 mg via ORAL
  Filled 2018-04-18 (×9): qty 1

## 2018-04-18 MED ORDER — ACETAMINOPHEN 650 MG RE SUPP: 650.0000 mg | Freq: Four times a day (QID) | RECTAL | Status: DC | PRN

## 2018-04-18 MED ORDER — SODIUM CHLORIDE 0.9% FLUSH
3.0000 mL | Freq: Two times a day (BID) | INTRAVENOUS | Status: DC
  Administered 2018-04-18 – 2018-04-23 (×7): 3 mL via INTRAVENOUS

## 2018-04-18 MED ORDER — CALCITRIOL 0.5 MCG PO CAPS
0.5000 ug | ORAL_CAPSULE | Freq: Every day | ORAL | Status: DC
  Administered 2018-04-18 – 2018-04-20 (×3): 0.5 ug via ORAL
  Filled 2018-04-18 (×3): qty 1

## 2018-04-18 MED ORDER — ROSUVASTATIN CALCIUM 10 MG PO TABS
5.0000 mg | ORAL_TABLET | Freq: Every day | ORAL | Status: DC
  Administered 2018-04-18 – 2018-04-22 (×5): 5 mg via ORAL
  Filled 2018-04-18 (×5): qty 1

## 2018-04-18 MED ORDER — SODIUM CHLORIDE 0.9% FLUSH
3.0000 mL | INTRAVENOUS | Status: DC | PRN
  Administered 2018-04-22: 3 mL via INTRAVENOUS
  Filled 2018-04-18: qty 3

## 2018-04-18 MED ORDER — ONDANSETRON HCL 4 MG/2ML IJ SOLN: 4.0000 mg | Freq: Four times a day (QID) | INTRAMUSCULAR | Status: DC | PRN

## 2018-04-18 MED ORDER — PATIROMER SORBITEX CALCIUM 8.4 G PO PACK
8.4000 g | PACK | Freq: Every day | ORAL | Status: DC
  Administered 2018-04-18 – 2018-04-19 (×2): 8.4 g via ORAL
  Filled 2018-04-18 (×3): qty 1

## 2018-04-18 MED ORDER — ONDANSETRON HCL 4 MG PO TABS: 4.0000 mg | ORAL_TABLET | Freq: Four times a day (QID) | ORAL | Status: DC | PRN

## 2018-04-18 NOTE — Consult Note (Signed)
HPI: I was asked by Dr. Myna Hidalgo to see [name] who is a 77 y.o. female with CKD Stage V, DM, HTN, and chronic thrombocytopenia who presented to the ED at the direction of her outpatient provider to initiate HD. She follows with Dr. Leodis Binet in [facility], and relays that she was referred to [facility] because her numbers acutely worsened. She endorses fatigue, weakness, decreased appetite, weight loss, and constipation. She denies SOB, N/V, changes in her skin, changes in taste of food. She does have a graft in the left upper extremities that was placed 2 years ago but has not been used. She tells me her kidney damage is due to the HTN and DM. She does still make urine.   Past Medical History:  Diagnosis Date  . Anemia    Pt is taking iron.   [name] Anxiety   . Arthritis   . Carotid stenosis    40-59% bilateral ICA stenosis in 2/12.  . Chronic low back pain   . CKD (chronic kidney disease)    Dr. Audie Clear at [facility]phrology  . Coronary artery disease    Pt presented 2/10 to [facility] with NSTEMI and diastolic CHF exacerbation.  LHC was done  3/10 showing 99% pRCA stenosis and 80% calcified pLAD stenosis with L=>R collaterals.  Pt was referred  for CABG which was done by Dr. Prescott Gum with LIMA-LAD, SVG-RCA, SVG-OM.  . Diabetes mellitus   . Diabetic neuropathy ([place])   . Diastolic CHF (HCC)    Echo (2/10) showed EF 55-65%, mild LVH, diastolic dysfunction, mild AS with mean gradient 12 mmHg, PASP 43 mmHg.  Echo (2/12): EF 55-60%, mild LVH, mild AS (mean gradient 12), PA systolic pressure 32 mmHg.     [name] GERD (gastroesophageal reflux disease)   . Heart murmur   . Hyperlipidemia   . Hypertension   . Mild aortic stenosis    mean gradient 12 mmHg in 2/12.  . Myocardial infarction ([place])    "mild"  . Pneumonia   . PONV (postoperative nausea and vomiting)   . Thrombocytopenia ([place])   . Unsteady gait    Past Surgical History:  Procedure Laterality Date  . AV FISTULA PLACEMENT Left 01/30/2015   Procedure: Creation of a Radial Cephalic AV Fistula left wrist;  Surgeon: [name], MD;  Location: [place];  Service: Vascular;  Laterality: Left;  . BACK SURGERY     multiple  . BREAST SURGERY     biopsy  . CARDIAC CATHETERIZATION    . CATARACT EXTRACTION W/ INTRAOCULAR LENS  IMPLANT, BILATERAL    . COLONOSCOPY Left 11/03/2016   Procedure: COLONOSCOPY;  Surgeon: [name], MD;  Location: [facility] ENDOSCOPY;  Service: Endoscopy;  Laterality: Left;  . COLONOSCOPY W/ BIOPSIES AND POLYPECTOMY    . CORONARY ARTERY BYPASS GRAFT  09/2008   pt with NSTEMI and diastolic CHF exacerbation.  LHC was done  3/10 showing 99% pRCA stenosis and 80% calcified pLAD stenosis with L=>R collaterals.  Pt was referred  for CABG which was done by Dr. Prescott Gum with LIMA-LAD, SVG-RCA, SVG-OM.  [name] ESOPHAGOGASTRODUODENOSCOPY N/A 11/02/2016   Procedure: ESOPHAGOGASTRODUODENOSCOPY (EGD);  Surgeon: [name], MD;  Location: [facility] ENDOSCOPY;  Service: Endoscopy;  Laterality: N/A;  . GIVENS CAPSULE STUDY N/A 11/29/2016   Procedure: GIVENS CAPSULE STUDY;  Surgeon: [name], MD;  Location: [place];  Service: Endoscopy;  Laterality: N/A;  . REVISON OF ARTERIOVENOUS FISTULA Left 07/02/2015   Procedure: REVISON OF LEFT RADIOCEPHALIC ARTERIOVENOUS  FISTULA;  Surgeon: [name], MD;  Location: [place];  Service: Vascular;  Laterality: Left;   Social History:  reports that she has quit smoking. Her smoking use included cigarettes. She has never used smokeless tobacco. She reports that she drinks alcohol. She reports that she does not use drugs.   Allergies:  Allergies  Allergen Reactions  . Lipitor [Atorvastatin] Other (See Comments)    Stomach pain  . Strawberry Extract Rash   Family History  Adopted: Yes  Family history unknown: Yes   Medications:  Scheduled: . ALPRAZolam  1 mg Oral QHS  . amLODipine  5 mg Oral Daily  . calcitRIOL  0.5 mcg Oral Daily  . carvedilol  12.5 mg Oral BID WC  . Chlorhexidine Gluconate  Cloth  6 each Topical Q0600  . furosemide  20 mg Oral Daily  . losartan  25 mg Oral Daily  . lubiprostone  24 mcg Oral BID WC  . patiromer  8.4 g Oral QPC lunch  . rosuvastatin  5 mg Oral q1800  . sevelamer carbonate  1,600 mg Oral TID WC  . sodium bicarbonate  650 mg Oral BID  . sodium chloride flush  3 mL Intravenous Q12H  . trimethoprim  50 mg Oral Daily   ROS: 12 point ROS preformed. All negative aside from those mentioned in the HPI.  Blood pressure (!) 155/62, pulse 62, temperature (!) 97.5 F (36.4 C), temperature source Oral, resp. rate 18, SpO2 98 %.  General: Tired appearing female in no acute distress HENT: Normocephalic, atraumatic, moist mucus membranes Pulm: Good air movement with no wheezing or crackles  CV: RRR, ? systolic murmurs, no rubs  Abdomen: Active bowel sounds, soft, non-distended, no tenderness to palpation  Extremities: Pulses palpable in all extremities, no LE edema  Skin: Warm and dry  Neuro: Alert and oriented x 3  Results for orders placed or performed during the hospital encounter of 04/17/18 (from the past 48 hour(s))  CBC     Status: Abnormal   Collection Time: 04/17/18  5:54 PM  Result Value Ref Range   WBC 6.6 4.0 - 10.5 K/uL   RBC 3.64 (L) 3.87 - 5.11 MIL/uL   Hemoglobin 12.6 12.0 - 15.0 g/dL   HCT 39.7 36.0 - 46.0 %   MCV 109.1 (H) 78.0 - 100.0 fL   MCH 34.6 (H) 26.0 - 34.0 pg   MCHC 31.7 30.0 - 36.0 g/dL   RDW 13.4 11.5 - 15.5 %   Platelets 107 (L) 150 - 400 K/uL    Comment: PLATELET COUNT CONFIRMED BY SMEAR Performed at [facility], [address] [place], [place] [address]   Basic metabolic panel     Status: Abnormal   Collection Time: 04/17/18  5:54 PM  Result Value Ref Range   Sodium 132 (L) 135 - 145 mmol/L   Potassium 5.0 3.5 - 5.1 mmol/L   Chloride 101 98 - 111 mmol/L   CO2 19 (L) 22 - 32 mmol/L   Glucose, Bld 159 (H) 70 - 99 mg/dL   BUN 105 (H) 8 - 23 mg/dL   Creatinine, Ser 4.94 (H) 0.44 - 1.00 mg/dL   Calcium  9.2 8.9 - 10.3 mg/dL   GFR calc non Af Amer 8 (L) >60 mL/min   GFR calc Af Amer 9 (L) >60 mL/min    Comment: (NOTE) The eGFR has been calculated using the CKD EPI equation. This calculation has not been validated in all clinical situations. eGFR's  persistently <60 mL/min signify possible Chronic Kidney Disease.    Anion gap 12 5 - 15    Comment: Performed at [facility] [address] [place], [place] [address]  Basic metabolic panel     Status: Abnormal   Collection Time: 04/18/18  6:25 AM  Result Value Ref Range   Sodium 133 (L) 135 - 145 mmol/L   Potassium 4.5 3.5 - 5.1 mmol/L   Chloride 108 98 - 111 mmol/L   CO2 17 (L) 22 - 32 mmol/L   Glucose, Bld 112 (H) 70 - 99 mg/dL   BUN 105 (H) 8 - 23 mg/dL   Creatinine, Ser 4.80 (H) 0.44 - 1.00 mg/dL   Calcium 9.0 8.9 - 10.3 mg/dL   GFR calc non Af Amer 8 (L) >60 mL/min   GFR calc Af Amer 9 (L) >60 mL/min    Comment: (NOTE) The eGFR has been calculated using the CKD EPI equation. This calculation has not been validated in all clinical situations. eGFR's persistently <60 mL/min signify possible Chronic Kidney Disease.    Anion gap 8 5 - 15    Comment: Performed at [facility] [address], [place], [place] [address]   No results found.  Assessment / Plan:  ESRD with Uremic Symptoms. Endorsing uremic symptoms. Labs reviewed patient has a nonanion gap metabolic acidosis and BUN >100. Calcium 9.0. Recent Hgb 12.6 with iron sat of 22% and ferritin of 57. No need for Epo right now but continue iron supplementation. Attempted HD today but left UE graft was not strong enough. Will get IR to place a tunneled catheter for HD. Plan for HD once tunneled catheter placed.   Hypertension. Currently on Amlodipine 5 mg QD, Carvedilol 12.5 mg BID, Losartan 25 mg QD. BP remains above goal, should improved a little with HD. Multiple options for better BP control, could increase her amlodipine and coreg, would hold on increasing her  losartan as we still have some renal function.   Shamere Dilworth 04/18/2018, 1:09 PM

## 2018-04-18 NOTE — ED Notes (Signed)
Communicated with son of pt over phone, number provided by Darci Current, [place] RN. Son called requesting an update, sharing concern it had been over an hour since a provider had spoken with pt. Son informed by this RN that nephrology had been requested and is pending, emergent dialysis is not indicated at this time according to lab results reviewed by ED providers and nephrology. Son requesting an update from Nephrology MD when consult had been completed and this RN informed him that he would be kept up to date as much as possible. Son expressed concern for method of transport in the event pt got discharged, this RN assured him we would work with him if that was the plan of action for the pt.

## 2018-04-18 NOTE — ED Notes (Signed)
Pt given saltine crackers and water

## 2018-04-18 NOTE — Progress Notes (Signed)
First dialysis treatment cancelled as pt dialysis access, got infiltrated at the  [facility] of treatment. AV fisula is fragile upon assessment. MD notified and agrees. Treatment rescheduled for tomorrow.

## 2018-04-18 NOTE — Progress Notes (Signed)
PROGRESS NOTE  Brief Narrative: [name] is a 77 y.o. female with a history of stage V CKD, T2DM, HTN, chronic thrombocytopenia who presented to the ED on the advice of her nephrologist, Dr. Audie Clear, to start hemodialysis. She was in no distress but had NAGMA with bicarb of 19, BUN 105, creatinine 4.94 and platelet count stable at 107k. Nephrology was consulted, recommended admission and inception of HD. She was admitted earlier this morning by Dr. Myna Hidalgo.   Subjective: No new complaints. Was unable to access LUE AVG in HD  Objective: BP 119/63 (BP Location: Right Arm)   Pulse 66   Temp 98.4 F (36.9 C) (Oral)   Resp 18   SpO2 97%   Gen: Elderly, pleasant female in no distress Pulm: Clear and nonlabored  CV: RRR, no murmur, no JVD, no edema GI: Soft, NT, ND, +BS  Neuro: Alert and oriented. No asterixis Ext: LUE AVG soft with +thrill and bruit.   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  - Nephrology consulted, planning to initiate hemodialysis while inpatient and get outpatient dialysis arranged.  - IR consulted for [facility] placement since LUE AVG (placed in 2016) has failed. Keep NPO until that procedure, then renal diet. - On calcitriol, renvela - Continue lasix - Continue veltassa (K wnl at 4.5)  History of T2DM: HbA1c 5.4% at last check, not on medications.   HTN:  - Continue home morvasc, losartan, coreg  Thrombocytopenia: Chronic, stable.   Hyperlipidemia:  - Continue statin  [name], MD 04/18/2018, 4:57 PM

## 2018-04-18 NOTE — H&P (Signed)
History and Physical    [name] FTD:[id number] DOB: [birth year] DOA: 04/17/2018  PCP: [name], MD   Patient coming from: Home   Chief Complaint: Fatigue, sent for initiation of HD   HPI: [name] is a 77 y.o. 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  Patient denies chest pain, palpitations, shortness of breath, or swelling.  She denies any recent fevers or chills.  She reports increased fatigue over the past several weeks.  ED Course: Upon arrival to the ED, patient is found to be afebrile, saturating well on room air, and with vitals otherwise normal.  Chemistry panel is notable for sodium of 132, bicarbonate 19, BUN 105, and creatinine of 4.94.  CBC features a stable chronic thrombocytopenia with platelets 107,000.  Nephrology was consulted by the ED physician and recommended a medical admission for initiation of dialysis.  Review of Systems:  All other systems reviewed and apart from HPI, are negative.  Past Medical History:  Diagnosis Date  . Anemia    Pt is taking iron.   [name] Anxiety   . Arthritis   . Carotid stenosis    40-59% bilateral ICA stenosis in 2/12.  . Chronic low back pain   . CKD (chronic kidney disease)    Dr. Audie Clear at [facility] Nephrology  . Coronary artery disease    Pt presented 2/10 to [facility] with NSTEMI and diastolic CHF exacerbation.  LHC was done  3/10 showing 99% pRCA stenosis and 80% calcified pLAD stenosis with L=>R collaterals.  Pt was referred  for CABG which was done by Dr. Prescott Gum with LIMA-LAD, SVG-RCA, SVG-OM.  . Diabetes mellitus   . Diabetic neuropathy ([place])   . Diastolic CHF (HCC)    Echo (2/10) showed EF 55-65%, mild LVH, diastolic dysfunction, mild AS with mean gradient 12 mmHg, PASP 43 mmHg.  Echo (2/12): EF  55-60%, mild LVH, mild AS (mean gradient 12), PA systolic pressure 32 mmHg.     [name] GERD (gastroesophageal reflux disease)   . Heart murmur   . Hyperlipidemia   . Hypertension   . Mild aortic stenosis    mean gradient 12 mmHg in 2/12.  . Myocardial infarction ([place])    "mild"  . Pneumonia   . PONV (postoperative nausea and vomiting)   . Thrombocytopenia ([place])   . Unsteady gait     Past Surgical History:  Procedure Laterality Date  . AV FISTULA PLACEMENT Left 01/30/2015   Procedure: Creation of a Radial Cephalic AV Fistula left wrist;  Surgeon: [name], MD;  Location: [place];  Service: Vascular;  Laterality: Left;  . BACK SURGERY     multiple  . BREAST SURGERY     biopsy  . CARDIAC CATHETERIZATION    . CATARACT EXTRACTION W/ INTRAOCULAR LENS  IMPLANT, BILATERAL    . COLONOSCOPY Left 11/03/2016   Procedure: COLONOSCOPY;  Surgeon: [name], MD;  Location: [facility] ENDOSCOPY;  Service: Endoscopy;  Laterality: Left;  . COLONOSCOPY W/ BIOPSIES AND POLYPECTOMY    . CORONARY ARTERY BYPASS GRAFT  09/2008   pt with NSTEMI and diastolic CHF exacerbation.  LHC was done  3/10 showing 99% pRCA stenosis and 80% calcified pLAD stenosis with L=>R collaterals.  Pt was referred  for CABG which was done by Dr. Prescott Gum with LIMA-LAD,  SVG-RCA, SVG-OM.  [name] ESOPHAGOGASTRODUODENOSCOPY N/A 11/02/2016   Procedure: ESOPHAGOGASTRODUODENOSCOPY (EGD);  Surgeon: [name], MD;  Location: [facility] ENDOSCOPY;  Service: Endoscopy;  Laterality: N/A;  . GIVENS CAPSULE STUDY N/A 11/29/2016   Procedure: GIVENS CAPSULE STUDY;  Surgeon: [name], MD;  Location: [place];  Service: Endoscopy;  Laterality: N/A;  . REVISON OF ARTERIOVENOUS FISTULA Left 07/02/2015   Procedure: REVISON OF LEFT RADIOCEPHALIC ARTERIOVENOUS FISTULA;  Surgeon: [name], MD;  Location: [place];  Service: Vascular;  Laterality: Left;     reports that she has quit smoking. Her smoking use included cigarettes. She has never used smokeless  tobacco. She reports that she drinks alcohol. She reports that she does not use drugs.  Allergies  Allergen Reactions  . Lipitor [Atorvastatin] Other (See Comments)    Stomach pain  . Strawberry Extract Rash    Family History  Adopted: Yes  Family history unknown: Yes     Prior to Admission medications   Medication Sig Start Date End Date Taking? Authorizing Provider  ALPRAZolam Duanne Moron) 1 MG tablet Take 1 mg by mouth at bedtime.  11/18/17  Yes [provider]  AMITIZA 24 MCG capsule Take 24 mcg by mouth 2 (two) times daily with a meal.  06/05/17  Yes [provider]  amLODipine (NORVASC) 5 MG tablet Take 1 tablet (5 mg total) by mouth daily. 01/20/15  Yes [name], NP  calcitRIOL (ROCALTROL) 0.5 MCG capsule Take 0.5 mcg by mouth daily. 04/09/18  Yes [provider]  carvedilol (COREG) 12.5 MG tablet TAKE 1 TABLET(12.5 MG) BY MOUTH TWICE DAILY Patient taking differently: Take 12.5 mg by mouth 2 (two) times daily with a meal.  03/14/16  Yes [name], NP  furosemide (LASIX) 20 MG tablet Take 20 mg by mouth daily.  12/17/14  Yes [provider]  losartan (COZAAR) 25 MG tablet Take 1 tablet (25 mg total) by mouth daily. 04/22/14  Yes Larey Dresser, MD  rosuvastatin (CRESTOR) 5 MG tablet TAKE 1 TABLET(5 MG) BY MOUTH DAILY Patient taking differently: Take 5 mg by mouth at bedtime.  06/27/17  Yes [name], NP  sevelamer carbonate (RENVELA) 800 MG tablet Take 1,600 mg 3 (three) times daily with meals by mouth.  12/06/16  Yes [provider]  sodium bicarbonate 650 MG tablet Take 650 mg by mouth 2 (two) times daily.   Yes [provider]  trimethoprim (TRIMPEX) 100 MG tablet Take 50 mg by mouth daily.  11/07/14  Yes [provider]  VELTASSA 8.4 G packet Take 8.4 g by mouth daily after lunch.  05/28/15  Yes [provider]  metoprolol succinate (TOPROL-XL) 50 MG 24 hr tablet Take 50 mg by mouth 2 (two) times  daily. Take with or immediately following a meal.  10/24/11  [provider]    Physical Exam: Vitals:   04/17/18 2230 04/18/18 0000 04/18/18 0100 04/18/18 0144  BP: (!) 180/58 (!) 151/77 (!) 168/61 (!) 190/70  Pulse: 67 67 69 73  Resp:    16  Temp:    98 F (36.7 C)  TempSrc:    Oral  SpO2: 98% 96% 97% 98%      Constitutional: NAD, calm  Eyes: PERTLA, lids and conjunctivae normal ENMT: Mucous membranes are moist. Posterior pharynx clear of any exudate or lesions.   Neck: normal, supple, no masses, no thyromegaly Respiratory: clear to auscultation bilaterally, no wheezing, no crackles. Normal respiratory effort.    Cardiovascular:  S1 & S2 heard, regular rate and rhythm. No extremity edema.   Abdomen: No distension, no tenderness, soft. Bowel sounds active.  Musculoskeletal: no clubbing / cyanosis. No joint deformity upper and lower extremities.   Skin: no significant rashes, lesions, ulcers. Warm, dry, well-perfused. Neurologic: CN 2-12 grossly intact. Sensation intact. Strength 5/5 in all 4 limbs.  Psychiatric: Alert and oriented x 3. Calm and cooperative.     Labs on Admission: I have personally reviewed following labs and imaging studies  CBC: Recent Labs  Lab 04/11/18 0837 04/17/18 1754  WBC 5.1 6.6  NEUTROABS 3.8  --   HGB 11.5* 12.6  HCT 35.1 39.7  MCV 106.4* 109.1*  PLT 96* 124*   Basic Metabolic Panel: Recent Labs  Lab 04/17/18 1754  NA 132*  K 5.0  CL 101  CO2 19*  GLUCOSE 159*  BUN 105*  CREATININE 4.94*  CALCIUM 9.2   GFR: CrCl cannot be calculated (Unknown ideal weight.). Liver Function Tests: No results for input(s): AST, ALT, ALKPHOS, BILITOT, PROT, ALBUMIN in the last 168 hours. No results for input(s): LIPASE, AMYLASE in the last 168 hours. No results for input(s): AMMONIA in the last 168 hours. Coagulation Profile: No results for input(s): INR, PROTIME in the last 168 hours. Cardiac Enzymes: No results for input(s): CKTOTAL,  CKMB, CKMBINDEX, TROPONINI in the last 168 hours. BNP (last 3 results) No results for input(s): PROBNP in the last 8760 hours. HbA1C: No results for input(s): HGBA1C in the last 72 hours. CBG: No results for input(s): GLUCAP in the last 168 hours. Lipid Profile: No results for input(s): CHOL, HDL, LDLCALC, TRIG, CHOLHDL, LDLDIRECT in the last 72 hours. Thyroid Function Tests: No results for input(s): TSH, T4TOTAL, FREET4, T3FREE, THYROIDAB in the last 72 hours. Anemia Panel: No results for input(s): VITAMINB12, FOLATE, FERRITIN, TIBC, IRON, RETICCTPCT in the last 72 hours. Urine analysis:    Component Value Date/Time   COLORURINE YELLOW 09/26/2016 1744   APPEARANCEUR TURBID (A) 09/26/2016 1744   LABSPEC 1.005 09/26/2016 1744   PHURINE 5.0 09/26/2016 1744   GLUCOSEU NEGATIVE 09/26/2016 1744   HGBUR SMALL (A) 09/26/2016 1744   BILIRUBINUR NEGATIVE 09/26/2016 1744   KETONESUR NEGATIVE 09/26/2016 1744   PROTEINUR 100 (A) 09/26/2016 1744   UROBILINOGEN 0.2 10/14/2008 0612   NITRITE NEGATIVE 09/26/2016 1744   LEUKOCYTESUR LARGE (A) 09/26/2016 1744   Sepsis Labs: @LABRCNTIP (procalcitonin:4,lacticidven:4) )No results found for this or any previous visit (from the past 240 hour(s)).   Radiological Exams on Admission: No results found.  EKG: Not performed.   Assessment/Plan   1. ESRD  - Patient is followed by nephrology for CKD V and has access in left arm that appears matured  - She was directed to ED for initiation of HD, nephrology was consulted, and medical admission was requested  - SLIV, fluid-restrict, continue bicarbonate, Lasix, Renvela, calcitriol, and Valtessa    2. Thrombocytopenia  - Platelets 107k on admission, chronic and stable with no bleeding    3. Hypertension  - BP at goal  - Continue Norvasc, losartan, and Coreg     DVT prophylaxis: SCD's  Code Status: Full  Family Communication: Discussed with patient  Consults called: Nephrology  Admission status:  Observation     Vianne Bulls, MD [facility] Pager [phone]  If 7PM-7AM, please contact night-coverage [email or web address] Password [email or web address]  04/18/2018, 4:08 AM

## 2018-04-18 NOTE — Progress Notes (Signed)
Received report from ED RN. Room ready for patient. Charl Wellen Joselita, RN 

## 2018-04-18 NOTE — Consult Note (Signed)
[facility]  Patient name: [name] MRN: [id number] DOB: May 30, 1941 Sex: female   REQUESTING PROVIDER:   Dr. Florene Glen   REASON FOR CONSULT:    ESRD  HISTORY OF PRESENT ILLNESS:   [name] is a 77 y.o. female, who is status post left radiocephalic fistula by Dr. Kellie Simmering several years ago.  This was attempted to be cannulated today without success.  She had a tunneled catheter placed today.I was asked to evaluate her fistula.  The patient's renal failure secondary to diabetes and hypertension  PAST MEDICAL HISTORY    Past Medical History:  Diagnosis Date  . Anemia    Pt is taking iron.   [name] Anxiety   . Arthritis   . Carotid stenosis    40-59% bilateral ICA stenosis in 2/12.  . Chronic low back pain   . CKD (chronic kidney disease)    Dr. Audie Clear at [facility] Nephrology  . Coronary artery disease    Pt presented 2/10 to [facility] with NSTEMI and diastolic CHF exacerbation.  LHC was done  3/10 showing 99% pRCA stenosis and 80% calcified pLAD stenosis with L=>R collaterals.  Pt was referred  for CABG which was done by Dr. Prescott Gum with LIMA-LAD, SVG-RCA, SVG-OM.  . Diabetes mellitus   . Diabetic neuropathy ([place])   . Diastolic CHF (HCC)    Echo (2/10) showed EF 55-65%, mild LVH, diastolic dysfunction, mild AS with mean gradient 12 mmHg, PASP 43 mmHg.  Echo (2/12): EF 55-60%, mild LVH, mild AS (mean gradient 12), PA systolic pressure 32 mmHg.     [name] GERD (gastroesophageal reflux disease)   . Heart murmur   . Hyperlipidemia   . Hypertension   . Mild aortic stenosis    mean gradient 12 mmHg in 2/12.  . Myocardial infarction ([place])    "mild"  . Pneumonia   . PONV (postoperative nausea and vomiting)   . Thrombocytopenia ([place])   . Unsteady gait      FAMILY HISTORY   Family History  Adopted: Yes  Family history unknown: Yes    SOCIAL HISTORY:   Social History   Socioeconomic History  . Marital  status: Married    Spouse name: Not on file  . Number of children: Not on file  . Years of education: Not on file  . Highest education level: Not on file  Occupational History  . Not on file  Social Needs  . Financial resource strain: Not on file  . Food insecurity:    Worry: Not on file    Inability: Not on file  . Transportation needs:    Medical: Not on file    Non-medical: Not on file  Tobacco Use  . Smoking status: Former Smoker    Types: Cigarettes  . Smokeless tobacco: Never Used  . Tobacco comment: quit 1988  Substance and Sexual Activity  . Alcohol use: Yes    Comment: beer daily  . Drug use: No  . Sexual activity: Never    Partners: Male  Lifestyle  . Physical activity:    Days per week: Not on file    Minutes per session: Not on file  . Stress: Not on file  Relationships  . Social connections:    Talks on phone: Not on file    Gets together: Not on file    Attends religious service: Not on file    Active member of club or organization: Not on file  Attends meetings of clubs or organizations: Not on file    Relationship status: Not on file  . Intimate partner violence:    Fear of current or ex partner: Not on file    Emotionally abused: Not on file    Physically abused: Not on file    Forced sexual activity: Not on file  Other Topics Concern  . Not on file  Social History Narrative  . Not on file    ALLERGIES:    Allergies  Allergen Reactions  . Lipitor [Atorvastatin] Other (See Comments)    Stomach pain  . Strawberry Extract Rash    CURRENT MEDICATIONS:    Current Facility-Administered Medications  Medication Dose Route Frequency Provider Last Rate Last Dose  . 0.9 %  sodium chloride infusion  250 mL Intravenous PRN Opyd, Ilene Qua, MD      . acetaminophen (TYLENOL) tablet 650 mg  650 mg Oral Q6H PRN Opyd, Ilene Qua, MD       Or  . acetaminophen (TYLENOL) suppository 650 mg  650 mg Rectal Q6H PRN Opyd, Ilene Qua, MD      . ALPRAZolam  Duanne Moron) tablet 1 mg  1 mg Oral QHS Opyd, Ilene Qua, MD      . amLODipine (NORVASC) tablet 5 mg  5 mg Oral Daily Opyd, Ilene Qua, MD   Stopped at 04/18/18 1000  . calcitRIOL (ROCALTROL) capsule 0.5 mcg  0.5 mcg Oral Daily Opyd, Ilene Qua, MD   0.5 mcg at 04/18/18 1033  . carvedilol (COREG) tablet 12.5 mg  12.5 mg Oral BID WC Opyd, Ilene Qua, MD   12.5 mg at 04/18/18 1739  . [START ON 04/19/2018] ceFAZolin (ANCEF) IVPB 2g/100 mL premix  2 g Intravenous On Call [name], MD      . Chlorhexidine Gluconate Cloth 2 % PADS 6 each  6 each Topical Q0600 Estanislado Emms, MD      . furosemide (LASIX) tablet 20 mg  20 mg Oral Daily Opyd, Ilene Qua, MD   20 mg at 04/18/18 0957  . hydrALAZINE (APRESOLINE) injection 10 mg  10 mg Intravenous Q4H PRN Opyd, Ilene Qua, MD      . Derrill Memo ON 04/19/2018] lidocaine-prilocaine (EMLA) cream   Topical Once Estanislado Emms, MD      . losartan (COZAAR) tablet 25 mg  25 mg Oral Daily Opyd, Ilene Qua, MD   Stopped at 04/18/18 1000  . lubiprostone (AMITIZA) capsule 24 mcg  24 mcg Oral BID WC Opyd, Ilene Qua, MD   24 mcg at 04/18/18 1739  . ondansetron (ZOFRAN) tablet 4 mg  4 mg Oral Q6H PRN Opyd, Ilene Qua, MD       Or  . ondansetron (ZOFRAN) injection 4 mg  4 mg Intravenous Q6H PRN Opyd, Ilene Qua, MD      . patiromer (VELTASSA) packet 8.4 g  8.4 g Oral QPC lunch Opyd, Ilene Qua, MD   8.4 g at 04/18/18 1455  . rosuvastatin (CRESTOR) tablet 5 mg  5 mg Oral q1800 Opyd, Ilene Qua, MD   5 mg at 04/18/18 1739  . senna-docusate (Senokot-S) tablet 1 tablet  1 tablet Oral QHS PRN Opyd, Ilene Qua, MD      . sevelamer carbonate (RENVELA) tablet 1,600 mg  1,600 mg Oral TID WC Opyd, Ilene Qua, MD   1,600 mg at 04/18/18 1738  . sodium bicarbonate tablet 650 mg  650 mg Oral BID Vianne Bulls, MD   650 mg at 04/18/18  2197  . sodium chloride flush (NS) 0.9 % injection 3 mL  3 mL Intravenous Q12H Opyd, Ilene Qua, MD   3 mL at 04/18/18 0958  . sodium chloride flush (NS) 0.9 % injection 3 mL   3 mL Intravenous PRN Opyd, Ilene Qua, MD      . trimethoprim (TRIMPEX) tablet 50 mg  50 mg Oral Daily Opyd, Ilene Qua, MD   50 mg at 04/18/18 5883    REVIEW OF SYSTEMS:   [X]  denotes positive finding, [ ]  denotes negative finding Cardiac  Comments:  Chest pain or chest pressure:    Shortness of breath upon exertion:    Short of breath when lying flat:    Irregular heart rhythm:        Vascular    Pain in calf, thigh, or hip brought on by ambulation:    Pain in feet at night that wakes you up from your sleep:     Blood clot in your veins:    Leg swelling:         Pulmonary    Oxygen at home:    Productive cough:     Wheezing:         Neurologic    Sudden weakness in arms or legs:     Sudden numbness in arms or legs:     Sudden onset of difficulty speaking or slurred speech:    Temporary loss of vision in one eye:     Problems with dizziness:         Gastrointestinal    Blood in stool:      Vomited blood:         Genitourinary    Burning when urinating:     Blood in urine:        Psychiatric    Major depression:         Hematologic    Bleeding problems:    Problems with blood clotting too easily:        Skin    Rashes or ulcers:        Constitutional    Fever or chills:     PHYSICAL EXAM:   Vitals:   04/18/18 0818 04/18/18 1349 04/18/18 1745 04/18/18 2145  BP: (!) 155/62 119/63 (!) 120/56 (!) 109/52  Pulse: 62 66 64 69  Resp: 18 18 18 16   Temp: (!) 97.5 F (36.4 C) 98.4 F (36.9 C) 98.3 F (36.8 C) 99.2 F (37.3 C)  TempSrc: Oral Oral Oral Oral  SpO2: 98% 97% 99% 98%    GENERAL: The patient is a well-nourished female, in no acute distress. The vital signs are documented above. CARDIAC: There is a regular rate and rhythm.  VASCULAR: palpable thrill in fistula PULMONARY: Nonlabored respirations ABDOMEN: Soft and non-tender with normal pitched bowel sounds.  MUSCULOSKELETAL: There are no major deformities or cyanosis. NEUROLOGIC: No focal weakness  or paresthesias are detected. SKIN: There are no ulcers or rashes noted. PSYCHIATRIC: The patient has a normal affect.  STUDIES:   none  ASSESSMENT and PLAN   I suspect the fistula was difficult to cannulate today because it appears to be very mobile.  There is a good thrill with remaining within the fistula.  Before banding this access, I would attempt to recannulate it.  Please contact us if there are additional issues with utilization of this fistula.   Annamarie Major, MD [facility] of [facility] [phone] Pager [phone]

## 2018-04-18 NOTE — ED Notes (Signed)
Son of pt, [name], requesting updates via phone.  [phone]

## 2018-04-18 NOTE — Consult Note (Signed)
Chief Complaint: Patient was seen in consultation today for renal failure  Referring Physician(s): Dr. Bonner Puna  Supervising Physician: Sandi Mariscal  Patient Status: [name]  History of Present Illness: [name] is a 77 y.o. female with past medical history of anemia, anxiety, CAD s/p CABG, DM, CKD Stage 5 was referred to [facility] ED by her Nephrologist for urgent dialysis planning.  Patient does have a left upper graft in place since 2016, however was unable to be used successfully at dialysis today.  Of note, this was the first attempt at using her graft.  IR now consulted for tunneled dialysis catheter placement.   Past Medical History:  Diagnosis Date  . Anemia    Pt is taking iron.   [name] Anxiety   . Arthritis   . Carotid stenosis    40-59% bilateral ICA stenosis in 2/12.  . Chronic low back pain   . CKD (chronic kidney disease)    Dr. Audie Clear at [facility] Nephrology  . Coronary artery disease    Pt presented 2/10 to [facility] with NSTEMI and diastolic CHF exacerbation.  LHC was done  3/10 showing 99% pRCA stenosis and 80% calcified pLAD stenosis with L=>R collaterals.  Pt was referred  for CABG which was done by Dr. Prescott Gum with LIMA-LAD, SVG-RCA, SVG-OM.  . Diabetes mellitus   . Diabetic neuropathy ([place])   . Diastolic CHF (HCC)    Echo (2/10) showed EF 55-65%, mild LVH, diastolic dysfunction, mild AS with mean gradient 12 mmHg, PASP 43 mmHg.  Echo (2/12): EF 55-60%, mild LVH, mild AS (mean gradient 12), PA systolic pressure 32 mmHg.     [name] GERD (gastroesophageal reflux disease)   . Heart murmur   . Hyperlipidemia   . Hypertension   . Mild aortic stenosis    mean gradient 12 mmHg in 2/12.  . Myocardial infarction ([place])    "mild"  . Pneumonia   . PONV (postoperative nausea and vomiting)   . Thrombocytopenia ([place])   . Unsteady gait     Past Surgical History:  Procedure Laterality Date  . AV FISTULA PLACEMENT Left 01/30/2015   Procedure: Creation of a Radial  Cephalic AV Fistula left wrist;  Surgeon: [name], MD;  Location: [place];  Service: Vascular;  Laterality: Left;  . BACK SURGERY     multiple  . BREAST SURGERY     biopsy  . CARDIAC CATHETERIZATION    . CATARACT EXTRACTION W/ INTRAOCULAR LENS  IMPLANT, BILATERAL    . COLONOSCOPY Left 11/03/2016   Procedure: COLONOSCOPY;  Surgeon: [name], MD;  Location[facility] ENDOSCOPY;  Service: Endoscopy;  Laterality: Left;  . COLONOSCOPY W/ BIOPSIES AND POLYPECTOMY    . CORONARY ARTERY BYPASS GRAFT  09/2008   pt with NSTEMI and diastolic CHF exacerbation.  LHC was done  3/10 showing 99% pRCA stenosis and 80% calcified pLAD stenosis with L=>R collaterals.  Pt was referred  for CABG which was done by Dr. Prescott Gum with LIMA-LAD, SVG-RCA, SVG-OM.  [name] ESOPHAGOGASTRODUODENOSCOPY N/A 11/02/2016   Procedure: ESOPHAGOGASTRODUODENOSCOPY (EGD);  Surgeon: [name], MD;  Location: [facility] ENDOSCOPY;  Service: Endoscopy;  Laterality: N/A;  . GIVENS CAPSULE STUDY N/A 11/29/2016   Procedure: GIVENS CAPSULE STUDY;  Surgeon: [name], MD;  Location: [place];  Service: Endoscopy;  Laterality: N/A;  . REVISON OF ARTERIOVENOUS FISTULA Left 07/02/2015   Procedure: REVISON OF LEFT RADIOCEPHALIC ARTERIOVENOUS FISTULA;  Surgeon: [name], MD;  Location: [place];  Service:  Vascular;  Laterality: Left;    Allergies: Lipitor [atorvastatin] and Strawberry extract  Medications: Prior to Admission medications   Medication Sig Start Date End Date Taking? Authorizing Provider  ALPRAZolam Duanne Moron) 1 MG tablet Take 1 mg by mouth at bedtime.  11/18/17  Yes [provider]  AMITIZA 24 MCG capsule Take 24 mcg by mouth 2 (two) times daily with a meal.  06/05/17  Yes [provider]  amLODipine (NORVASC) 5 MG tablet Take 1 tablet (5 mg total) by mouth daily. 01/20/15  Yes [name], NP  calcitRIOL (ROCALTROL) 0.5 MCG capsule Take 0.5 mcg by mouth daily. 04/09/18  Yes [provider]  carvedilol  (COREG) 12.5 MG tablet TAKE 1 TABLET(12.5 MG) BY MOUTH TWICE DAILY Patient taking differently: Take 12.5 mg by mouth 2 (two) times daily with a meal.  03/14/16  Yes [name], NP  furosemide (LASIX) 20 MG tablet Take 20 mg by mouth daily.  12/17/14  Yes [provider]  losartan (COZAAR) 25 MG tablet Take 1 tablet (25 mg total) by mouth daily. 04/22/14  Yes Larey Dresser, MD  rosuvastatin (CRESTOR) 5 MG tablet TAKE 1 TABLET(5 MG) BY MOUTH DAILY Patient taking differently: Take 5 mg by mouth at bedtime.  06/27/17  Yes [name], NP  sevelamer carbonate (RENVELA) 800 MG tablet Take 1,600 mg 3 (three) times daily with meals by mouth.  12/06/16  Yes [provider]  sodium bicarbonate 650 MG tablet Take 650 mg by mouth 2 (two) times daily.   Yes [provider]  trimethoprim (TRIMPEX) 100 MG tablet Take 50 mg by mouth daily.  11/07/14  Yes [provider]  VELTASSA 8.4 G packet Take 8.4 g by mouth daily after lunch.  05/28/15  Yes [provider]  metoprolol succinate (TOPROL-XL) 50 MG 24 hr tablet Take 50 mg by mouth 2 (two) times daily. Take with or immediately following a meal.  10/24/11  [provider]     Family History  Adopted: Yes  Family history unknown: Yes    Social History   Socioeconomic History  . Marital status: Married    Spouse name: Not on file  . Number of children: Not on file  . Years of education: Not on file  . Highest education level: Not on file  Occupational History  . Not on file  Social Needs  . Financial resource strain: Not on file  . Food insecurity:    Worry: Not on file    Inability: Not on file  . Transportation needs:    Medical: Not on file    Non-medical: Not on file  Tobacco Use  . Smoking status: Former Smoker    Types: Cigarettes  . Smokeless tobacco: Never Used  . Tobacco comment: quit 1988  Substance and Sexual Activity  . Alcohol use: Yes    Comment: beer daily  . Drug  use: No  . Sexual activity: Never    Partners: Male  Lifestyle  . Physical activity:    Days per week: Not on file    Minutes per session: Not on file  . Stress: Not on file  Relationships  . Social connections:    Talks on phone: Not on file    Gets together: Not on file    Attends religious service: Not on file    Active member of club or organization: Not on file    Attends meetings of clubs or organizations: Not on file  Relationship status: Not on file  Other Topics Concern  . Not on file  Social History Narrative  . Not on file     Review of Systems: A 12 point ROS discussed and pertinent positives are indicated in the HPI above.  All other systems are negative.  Review of Systems  Constitutional: Negative for fatigue and fever.  Respiratory: Negative for cough and shortness of breath.   Cardiovascular: Negative for chest pain.  Gastrointestinal: Negative for abdominal pain.  Genitourinary: Negative for dysuria.  Musculoskeletal: Negative for back pain.  Psychiatric/Behavioral: Negative for behavioral problems and confusion.    Vital Signs: BP 119/63 (BP Location: Right Arm)   Pulse 66   Temp 98.4 F (36.9 C) (Oral)   Resp 18   SpO2 97%   Physical Exam  Constitutional: She is oriented to person, place, and time. She appears well-developed. No distress.  Neck: Normal range of motion. Neck supple.  Cardiovascular: Normal rate, regular rhythm and normal heart sounds. Exam reveals no gallop and no friction rub.  No murmur heard. Pulmonary/Chest: Effort normal and breath sounds normal. No respiratory distress.  Abdominal: Soft. Bowel sounds are normal. She exhibits no distension. There is no tenderness.  Neurological: She is alert and oriented to person, place, and time.  Skin: Skin is warm and dry. She is not diaphoretic.  Psychiatric: She has a normal mood and affect. Her behavior is normal. Judgment and thought content normal.  Nursing note and vitals  reviewed.        Imaging: No results found.  Labs:  CBC: Recent Labs    02/28/18 0842 03/21/18 1104 04/11/18 0837 04/17/18 1754  WBC 3.0* 5.0 5.1 6.6  HGB 8.9* 10.6* 11.5* 12.6  HCT 28.3* 32.6* 35.1 39.7  PLT 72* 98* 96* 107*    COAGS: No results for input(s): INR, APTT in the last 8760 hours.  BMP: Recent Labs    04/17/18 1754 04/18/18 0625  NA 132* 133*  K 5.0 4.5  CL 101 108  CO2 19* 17*  GLUCOSE 159* 112*  BUN 105* 105*  CALCIUM 9.2 9.0  CREATININE 4.94* 4.80*  GFRNONAA 8* 8*  GFRAA 9* 9*    LIVER FUNCTION TESTS: Recent Labs    12/19/17 0947  BILITOT 0.4  AST 18  ALT 12  ALKPHOS 97  PROT 6.1  ALBUMIN 3.8    TUMOR MARKERS: No results for input(s): AFPTM, CEA, CA199, CHROMGRNA in the last 8760 hours.  Assessment and Plan: Renal failure Patient with chronic kidney disease and plans to initiate dialysis years ago.  She underwent left upper extremity graft placement 01/30/15, however it was never used due to renal recovery.  Her labwork has acutely worsened and she was referred to [facility] for admission and initiation of dialysis.  Her graft was insufficient for dialysis today and IR has been consulted for a tunneled dialysis catheter.  Patient has been NPO.  Will need INR- this has been ordered.  Placement today vs. Tomorrow as schedule and lab work allows.   Thank you for this interesting consult.  I greatly enjoyed meeting Antoria T Deisher and look forward to participating in their care.  A copy of this report was sent to the requesting provider on this date.  Electronically Signed: Docia Barrier, PA 04/18/2018, 2:12 PM   I spent a total of 40 Minutes    in face to face in clinical consultation, greater than 50% of which was counseling/coordinating care for renal failure.

## 2018-04-19 DIAGNOSIS — D696 Thrombocytopenia, unspecified: Secondary | ICD-10-CM

## 2018-04-19 DIAGNOSIS — I10 Essential (primary) hypertension: Secondary | ICD-10-CM

## 2018-04-19 LAB — RENAL FUNCTION PANEL
ANION GAP: 10 (ref 5–15)
Albumin: 3.2 g/dL — ABNORMAL LOW (ref 3.5–5.0)
BUN: 120 mg/dL — AB (ref 8–23)
CHLORIDE: 104 mmol/L (ref 98–111)
CO2: 17 mmol/L — AB (ref 22–32)
Calcium: 8.4 mg/dL — ABNORMAL LOW (ref 8.9–10.3)
Creatinine, Ser: 5.35 mg/dL — ABNORMAL HIGH (ref 0.44–1.00)
GFR calc Af Amer: 8 mL/min — ABNORMAL LOW (ref >60)
GFR calc non Af Amer: 7 mL/min — ABNORMAL LOW (ref >60)
GLUCOSE: 135 mg/dL — AB (ref 70–99)
POTASSIUM: 4.6 mmol/L (ref 3.5–5.1)
Phosphorus: 6.5 mg/dL — ABNORMAL HIGH (ref 2.5–4.6)
Sodium: 131 mmol/L — ABNORMAL LOW (ref 135–145)

## 2018-04-19 LAB — FOLATE: Folate: 6.1 ng/mL (ref >5.9)

## 2018-04-19 LAB — VITAMIN B12: Vitamin B-12: 274 pg/mL (ref 180–914)

## 2018-04-19 MED ORDER — FOLIC ACID 1 MG PO TABS
1.0000 mg | ORAL_TABLET | Freq: Every day | ORAL | Status: DC
  Administered 2018-04-19 – 2018-04-23 (×4): 1 mg via ORAL
  Filled 2018-04-19 (×5): qty 1

## 2018-04-19 MED ORDER — CYANOCOBALAMIN 1000 MCG/ML IJ SOLN
1000.0000 ug | Freq: Every day | INTRAMUSCULAR | Status: DC
  Administered 2018-04-19 – 2018-04-23 (×4): 1000 ug via INTRAMUSCULAR
  Filled 2018-04-19 (×5): qty 1

## 2018-04-19 NOTE — Procedures (Signed)
Tolerating hemodialysis via LUE AVF.  Appreciate help with VVS and IR. Successful restick.  Await OP spot. Erling Cruz, MD

## 2018-04-19 NOTE — Progress Notes (Signed)
PROGRESS NOTE  Brief Narrative: [name] is a 77 y.o. female with a history of stage V CKD, T2DM, HTN, chronic thrombocytopenia who presented to the ED on the advice of her nephrologist, Dr. Audie Clear, to start hemodialysis. She was in no distress but had NAGMA with bicarb of 19, BUN 105, creatinine 4.94 and platelet count stable at 107k. Nephrology was consulted, recommended admission and inception of HD. Initial access of the fistula placed in 2016 was unsuccessful and vascular surgery was consulted. Fortunately, on reattempt 9/5, it was successfully cannulated and she underwent HD. Now awaiting arrangement of outpatient hemodialysis.   Subjective: Feels tired following dialysis today. Tolerated this relatively well. Denies any trouble eating. No vomiting, dyspnea, itching.  Objective: BP (!) 122/52 (BP Location: Right Arm)   Pulse 63   Temp 98 F (36.7 C) (Oral)   Resp 18   Wt 48.8 kg   SpO2 98%   BMI 16.36 kg/m   Gen: Pleasant, elderly, thin female in no distress. Pulm: Nonlabored and clear CV: RRR, no murmur, rub or gallop. No JVD or edema. GI: Soft, NT, ND, +BS Neuro: Alert, nonfocal, no asterixis Ext: Left forearm AVF mobile with +thrill and bruit. LUE AVG soft with +thrill and bruit.   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  - Nephrology following, initial HD 9/5 thru AVF.  - Needs to be clipped. - On calcitriol, renvela - Continue lasix (making urine) - Continue veltassa (K wnl at 4.5)  History of T2DM: HbA1c 5.4% at last check, not on medications.   HTN:  - Continue home norvasc, losartan, coreg  Thrombocytopenia: Chronic, stable.   Hyperlipidemia:  - Continue statin  Underweight: BMI 16.  - Dietitian consulted  Macrocytosis: Checked folate and B12 levels, both marginal.  - Started replacement daily 9/5.  [name], MD 04/19/2018, 3:39 PM

## 2018-04-19 NOTE — Progress Notes (Signed)
Subjective: Interval History: Ms. Blankenburg is doing well this AM. She was able to eat lunch and dinner last night but then felt nauseous for the majority of the night. We discussed the plan to attempt HD again today via her left AVF prior to HD cath placement. She agrees.   Objective: Vital signs in last 24 hours: Temp:  [97.5 F (36.4 C)-99.2 F (37.3 C)] 97.5 F (36.4 C) (09/05 0502) Pulse Rate:  [62-69] 65 (09/05 0502) Resp:  [16-18] 18 (09/05 0502) BP: (109-155)/(52-63) 127/59 (09/05 0502) SpO2:  [97 %-99 %] 99 % (09/05 0502) Weight change:   Intake/Output from previous day: 09/04 0701 - 09/05 0700 In: 900 [P.O.:900] Out: 451 [Urine:451]   Intake/Output this shift: No intake/output data recorded.  General: Thin elderly female in no acute distress Pulm: Good air movement with no wheezing or crackles  CV: RRR, no murmurs, no rubs  Abdomen: Active bowel sounds, soft, non-distended, no tenderness to palpation  Extremities: Pulses palpable in all extremities, no LE edema   Lab Results: Recent Labs    04/17/18 1754  WBC 6.6  HGB 12.6  HCT 39.7  PLT 107*   BMET:  Recent Labs    04/18/18 0625 04/19/18 0423  NA 133* 131*  K 4.5 4.6  CL 108 104  CO2 17* 17*  GLUCOSE 112* 135*  BUN 105* 120*  CREATININE 4.80* 5.35*  CALCIUM 9.0 8.4*   No results for input(s): PTH in the last 72 hours.   Iron Studies: No results for input(s): IRON, TIBC, TRANSFERRIN, FERRITIN in the last 72 hours.   Studies/Results: No results found.  Scheduled: . ALPRAZolam  1 mg Oral QHS  . amLODipine  5 mg Oral Daily  . calcitRIOL  0.5 mcg Oral Daily  . carvedilol  12.5 mg Oral BID WC  . Chlorhexidine Gluconate Cloth  6 each Topical Q0600  . furosemide  20 mg Oral Daily  . lidocaine-prilocaine   Topical Once  . losartan  25 mg Oral Daily  . lubiprostone  24 mcg Oral BID WC  . patiromer  8.4 g Oral QPC lunch  . rosuvastatin  5 mg Oral q1800  . sevelamer carbonate  1,600 mg Oral TID WC  .  sodium bicarbonate  650 mg Oral BID  . sodium chloride flush  3 mL Intravenous Q12H  . trimethoprim  50 mg Oral Daily   Assessment/Plan:  ESRD with Uremic Symptoms. Endorsing uremic symptoms. Unsucessful HD attempt yesterday and LUE fistula is soft and mobile. Will try again this AM prior to HD catheter placement. Labs reviewed patient has a nonanion gap metabolic acidosis and BUN >100. Calcium 9.0. Recent Hgb 12.6 with iron sat of 22% and ferritin of 57. No need for Epo right now but continue iron supplementation.   Hypertension. Currently on Amlodipine 5 mg QD, Carvedilol 12.5 mg BID, Losartan 25 mg QD. BP remains above goal, should improved a little with HD. Multiple options for better BP control, could increase her amlodipine and coreg, would hold on increasing her losartan as we still have some renal function.     LOS: 1 day   Brittony Billick 04/19/2018,6:50 AM

## 2018-04-20 DIAGNOSIS — E43 Unspecified severe protein-calorie malnutrition: Secondary | ICD-10-CM

## 2018-04-20 LAB — RENAL FUNCTION PANEL
ANION GAP: 11 (ref 5–15)
Albumin: 3.4 g/dL — ABNORMAL LOW (ref 3.5–5.0)
BUN: 77 mg/dL — AB (ref 8–23)
CHLORIDE: 98 mmol/L (ref 98–111)
CO2: 23 mmol/L (ref 22–32)
Calcium: 8.7 mg/dL — ABNORMAL LOW (ref 8.9–10.3)
Creatinine, Ser: 4.77 mg/dL — ABNORMAL HIGH (ref 0.44–1.00)
GFR calc Af Amer: 9 mL/min — ABNORMAL LOW (ref >60)
GFR, EST NON AFRICAN AMERICAN: 8 mL/min — AB (ref >60)
Glucose, Bld: 117 mg/dL — ABNORMAL HIGH (ref 70–99)
POTASSIUM: 3.6 mmol/L (ref 3.5–5.1)
Phosphorus: 4.7 mg/dL — ABNORMAL HIGH (ref 2.5–4.6)
Sodium: 132 mmol/L — ABNORMAL LOW (ref 135–145)

## 2018-04-20 LAB — MRSA PCR SCREENING: MRSA by PCR: NEGATIVE

## 2018-04-20 MED ORDER — DOXERCALCIFEROL 4 MCG/2ML IV SOLN
2.0000 ug | INTRAVENOUS | Status: DC
  Filled 2018-04-20: qty 2

## 2018-04-20 MED ORDER — LIDOCAINE-PRILOCAINE 2.5-2.5 % EX CREA: TOPICAL_CREAM | Freq: Once | CUTANEOUS | Status: DC

## 2018-04-20 MED ORDER — RENA-VITE PO TABS
1.0000 | ORAL_TABLET | Freq: Every day | ORAL | Status: DC
  Administered 2018-04-20 – 2018-04-22 (×3): 1 via ORAL
  Filled 2018-04-20 (×3): qty 1

## 2018-04-20 MED ORDER — CHLORHEXIDINE GLUCONATE CLOTH 2 % EX PADS: 6.0000 | MEDICATED_PAD | Freq: Every day | CUTANEOUS | Status: DC

## 2018-04-20 MED ORDER — LIDOCAINE-PRILOCAINE 2.5-2.5 % EX CREA
TOPICAL_CREAM | Freq: Once | CUTANEOUS | Status: AC
  Administered 2018-04-21: 07:00:00 via TOPICAL
  Filled 2018-04-20: qty 5

## 2018-04-20 NOTE — Progress Notes (Addendum)
Initial Nutrition Assessment  DOCUMENTATION CODES:   Severe malnutrition in context of chronic illness, Underweight  INTERVENTION:    Nepro Shake po BID, each supplement provides 425 kcal and 19 grams protein  Diet education provided.   NUTRITION DIAGNOSIS:   Severe Malnutrition related to chronic illness(CKD, ESRD) as evidenced by severe fat depletion, severe muscle depletion, percent weight loss(9% weight loss within 1.5 months).  GOAL:   Patient will meet greater than or equal to 90% of their needs  MONITOR:   PO intake, Supplement acceptance, Labs, I & O's  REASON FOR ASSESSMENT:   Consult Assessment of nutrition requirement/status, Diet education  ASSESSMENT:   77 yo female with PMH of HTN, HLD, DM, CAD, CHF, anemia, MI, GERD, neuropathy, CKD who was admitted on 9/3 for initiation of HD for ESRD.   S/P first HD session yesterday; to receive HD again tomorrow.   Patient thinks she has been eating well at home, but admits that maybe she wasn't eating enough because she has lost weight over the past year. She has lost 9% of her usual weight within the past 1.5 months, which is significant for the time frame. She has been drinking Ensure supplements at home; she agreed to try Nepro shake supplement while in the hospital.   Patient is very familiar with the renal diet, she has been following a renal diet for the past 2 years. Encouraged patient to drink Nepro at least twice daily in addition to eating her meals and to increase protein intake. Provided examples of ways to increase protein intake.  Labs reviewed. Sodium 132 (L), Potassium 3.6 (WNL), phosphorus 4.7 (H, acceptable for HD pt) Medications reviewed and include vitamin T-55, folic acid, Amitiza, Rena-vit, Renvela, Trimpex.  NUTRITION - FOCUSED PHYSICAL EXAM:    Most Recent Value  Orbital Region  Mild depletion  Upper Arm Region  Severe depletion  Thoracic and Lumbar Region  Severe depletion  Buccal Region   Mild depletion  Temple Region  Mild depletion  Clavicle Bone Region  Moderate depletion  Clavicle and Acromion Bone Region  Severe depletion  Scapular Bone Region  Moderate depletion  Dorsal Hand  Severe depletion  Patellar Region  Moderate depletion  Anterior Thigh Region  Severe depletion  Posterior Calf Region  Mild depletion  Edema (RD Assessment)  Mild  Hair  Reviewed  Eyes  Reviewed  Mouth  Reviewed  Skin  Reviewed  Nails  Reviewed       Diet Order:   Diet Order            Diet renal with fluid restriction Fluid restriction: 1200 mL Fluid; Room service appropriate? Yes; Fluid consistency: Thin  Diet effective now              EDUCATION NEEDS:   Education needs have been addressed  Skin:  Skin Assessment: Skin Integrity Issues: Skin Integrity Issues:: Stage II Stage II: R buttocks  Last BM:  9/4  Height:   Ht Readings from Last 1 Encounters:  04/20/18 5\' 8"  (1.727 m)    Weight:   Wt Readings from Last 1 Encounters:  04/19/18 48.8 kg   Ideal Body Weight:  63.6 kg  BMI:  Body mass index is 16.36 kg/m.  Estimated Nutritional Needs:   Kcal:  1700-1900  Protein:  75-85 gm  Fluid:  1 L + UOP     Molli Barrows, RD, LDN, [facility] Pager [phone] After Hours Pager [phone]

## 2018-04-20 NOTE — Care Management Important Message (Signed)
Important Message  Patient Details  Name: [name] MRN: [id number] Date of Birth: [birth year]   Medicare Important Message Given:  Yes    Mirella Gueye Montine Circle 04/20/2018, 3:41 PM

## 2018-04-20 NOTE — Progress Notes (Addendum)
PROGRESS NOTE  Brief Narrative: [name] is a 77 y.o. female with a history of stage V CKD, T2DM, HTN, chronic thrombocytopenia who presented to the ED on the advice of her nephrologist, Dr. Audie Clear, to start hemodialysis. She was in no distress but had NAGMA with bicarb of 19, BUN 105, creatinine 4.94 and platelet count stable at 107k. Nephrology was consulted, recommended admission and inception of HD. Initial access of the fistula placed in 2016 was unsuccessful and vascular surgery was consulted. Fortunately, on reattempt 9/5, it was successfully cannulated and she underwent HD. Now awaiting arrangement of outpatient hemodialysis.   Subjective: Feels weaker than usual but no pain, dyspnea, N/V/D. Notes significant bruising to left arm.  Objective: BP (!) 91/50 (BP Location: Right Arm)   Pulse 68   Temp 98.6 F (37 C) (Oral)   Resp 16   Ht 5\' 8"  (1.727 m) Comment: per previous documentation  Wt 48.8 kg   SpO2 96%   BMI 16.36 kg/m   Gen: 77 y.o. female in no distress Pulm: Nonlabored breathing room air. Clear. CV: Regular rate and rhythm. No murmur, rub, or gallop. No JVD, no dependent edema. GI: Abdomen soft, non-tender, non-distended, with normoactive bowel sounds.  Ext: Warm, no deformities. Left forearm AVF +_thrill, significant proximal bruising, nontender. No erythema. Skin: No new rashes, lesions or ulcers on visualized skin.  Neuro: Alert and oriented. No focal neurological deficits. Psych: Judgement and insight appear fair. Mood euthymic & affect congruent. Behavior is appropriate.    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  - Nephrology following, initial HD 9/5 thru AVF. Next planned 9/7. - Needs to be clipped. Verified with CSW this is underway. - On calcitriol, renvela - Can stop lasix now. - Can stop veltassa   History of T2DM: HbA1c 5.4% at last check, not on medications.   HTN:  - Continue home norvasc, losartan,  coreg  Thrombocytopenia: Chronic, stable.   Hyperlipidemia:  - Continue statin  Severe protein calorie malnutrition: BMI 16.  - Dietitian consulted  Macrocytosis due to B12 and folate deficiency: Checked folate and B12 levels, both marginal.  - Started replacement daily 9/5.  [name], MD 04/20/2018, 3:22 PM

## 2018-04-20 NOTE — Progress Notes (Signed)
Assessment/Plan:  ESRD with Uremic Symptoms, tol HD yesterday...await OP spot? DC Sat LUE AVF, will plan next HD Sat to preserve fragile AVF Hypertension. Well controlled now, stop bicarb and furosemide  Subjective: Interval History: Tol HD  Objective: Vital signs in last 24 hours: Temp:  [97.5 F (36.4 C)-98.7 F (37.1 C)] 98.7 F (37.1 C) (09/06 0535) Pulse Rate:  [60-75] 70 (09/06 0535) Resp:  [16-18] 16 (09/06 0535) BP: (57-129)/(32-63) 129/60 (09/06 0535) SpO2:  [96 %-98 %] 96 % (09/06 0535) Weight:  [48.8 kg] 48.8 kg (09/05 2141) Weight change:   Intake/Output from previous day: 09/05 0701 - 09/06 0700 In: 0  Out: 1444 [Urine:325] Intake/Output this shift: No intake/output data recorded.  General appearance: alert and cooperative Back: negative, symmetric, no curvature. ROM normal. No CVA tenderness. Resp: clear to auscultation bilaterally Extremities: LUE ecchymotic and bandaged  Lab Results: Recent Labs    04/17/18 1754  WBC 6.6  HGB 12.6  HCT 39.7  PLT 107*   BMET:  Recent Labs    04/19/18 0423 04/20/18 0657  NA 131* 132*  K 4.6 3.6  CL 104 98  CO2 17* 23  GLUCOSE 135* 117*  BUN 120* 77*  CREATININE 5.35* 4.77*  CALCIUM 8.4* 8.7*   No results for input(s): PTH in the last 72 hours. Iron Studies: No results for input(s): IRON, TIBC, TRANSFERRIN, FERRITIN in the last 72 hours. Studies/Results: No results found.  Scheduled: . ALPRAZolam  1 mg Oral QHS  . amLODipine  5 mg Oral Daily  . calcitRIOL  0.5 mcg Oral Daily  . carvedilol  12.5 mg Oral BID WC  . cyanocobalamin  1,000 mcg Intramuscular Daily  . folic acid  1 mg Oral Daily  . furosemide  20 mg Oral Daily  . lidocaine-prilocaine   Topical Once  . losartan  25 mg Oral Daily  . lubiprostone  24 mcg Oral BID WC  . patiromer  8.4 g Oral QPC lunch  . rosuvastatin  5 mg Oral q1800  . sevelamer carbonate  1,600 mg Oral TID WC  . sodium bicarbonate  650 mg Oral BID  . sodium chloride  flush  3 mL Intravenous Q12H  . trimethoprim  50 mg Oral Daily   Continuous: . sodium chloride        LOS: 2 days   Estanislado Emms 04/20/2018,9:03 AM

## 2018-04-21 DIAGNOSIS — E43 Unspecified severe protein-calorie malnutrition: Secondary | ICD-10-CM

## 2018-04-21 DIAGNOSIS — F411 Generalized anxiety disorder: Secondary | ICD-10-CM

## 2018-04-21 DIAGNOSIS — D631 Anemia in chronic kidney disease: Secondary | ICD-10-CM

## 2018-04-21 LAB — RENAL FUNCTION PANEL
ALBUMIN: 3.2 g/dL — AB (ref 3.5–5.0)
ANION GAP: 12 (ref 5–15)
BUN: 98 mg/dL — ABNORMAL HIGH (ref 8–23)
CHLORIDE: 95 mmol/L — AB (ref 98–111)
CO2: 22 mmol/L (ref 22–32)
Calcium: 8.7 mg/dL — ABNORMAL LOW (ref 8.9–10.3)
Creatinine, Ser: 5.98 mg/dL — ABNORMAL HIGH (ref 0.44–1.00)
GFR calc non Af Amer: 6 mL/min — ABNORMAL LOW (ref >60)
GFR, EST AFRICAN AMERICAN: 7 mL/min — AB (ref >60)
Glucose, Bld: 109 mg/dL — ABNORMAL HIGH (ref 70–99)
PHOSPHORUS: 5.8 mg/dL — AB (ref 2.5–4.6)
POTASSIUM: 4.1 mmol/L (ref 3.5–5.1)
Sodium: 129 mmol/L — ABNORMAL LOW (ref 135–145)

## 2018-04-21 LAB — CBC
HCT: 31.1 % — ABNORMAL LOW (ref 36.0–46.0)
HEMOGLOBIN: 9.9 g/dL — AB (ref 12.0–15.0)
MCH: 34.7 pg — AB (ref 26.0–34.0)
MCHC: 31.8 g/dL (ref 30.0–36.0)
MCV: 109.1 fL — ABNORMAL HIGH (ref 78.0–100.0)
PLATELETS: 79 10*3/uL — AB (ref 150–400)
RBC: 2.85 MIL/uL — ABNORMAL LOW (ref 3.87–5.11)
RDW: 13.5 % (ref 11.5–15.5)
WBC: 4.7 10*3/uL (ref 4.0–10.5)

## 2018-04-21 MED ORDER — SODIUM CHLORIDE 0.9 % IV SOLN: 100.0000 mL | INTRAVENOUS | Status: DC | PRN

## 2018-04-21 MED ORDER — HEPARIN SODIUM (PORCINE) 1000 UNIT/ML DIALYSIS: 1000.0000 [IU] | INTRAMUSCULAR | Status: DC | PRN

## 2018-04-21 MED ORDER — PENTAFLUOROPROP-TETRAFLUOROETH EX AERO: 1.0000 "application " | INHALATION_SPRAY | CUTANEOUS | Status: DC | PRN

## 2018-04-21 MED ORDER — HEPARIN SODIUM (PORCINE) 1000 UNIT/ML DIALYSIS: 20.0000 [IU]/kg | INTRAMUSCULAR | Status: DC | PRN

## 2018-04-21 MED ORDER — LIDOCAINE HCL (PF) 1 % IJ SOLN: 5.0000 mL | INTRAMUSCULAR | Status: DC | PRN

## 2018-04-21 MED ORDER — LIDOCAINE-PRILOCAINE 2.5-2.5 % EX CREA: 1.0000 "application " | TOPICAL_CREAM | CUTANEOUS | Status: DC | PRN

## 2018-04-21 NOTE — Procedures (Signed)
Attempted treatment failed due to infiltration.   Will try later and if unsuccessful, need a TDC. Erling Cruz, MD

## 2018-04-21 NOTE — Progress Notes (Addendum)
PROGRESS NOTE  Brief Narrative: [name] is a 77 y.o. female with a history of stage V CKD, T2DM, HTN, chronic thrombocytopenia who presented to the ED on the advice of her nephrologist, Dr. Audie Clear, to start hemodialysis. She was in no distress but had NAGMA with bicarb of 19, BUN 105, creatinine 4.94 and platelet count stable at 107k. Nephrology was consulted, recommended admission and inception of HD. Initial access of the fistula placed in 2016 was unsuccessful and vascular surgery was consulted. Fortunately, on reattempt 9/5, it was successfully cannulated and she underwent HD. Now awaiting arrangement of outpatient hemodialysis.   Subjective: No new complaints. Denies chest pain, dyspnea. Anxious about not able to have HD today.  Objective: BP 136/65 (BP Location: Right Arm)   Pulse 70   Temp 97.8 F (36.6 C) (Oral)   Resp 18   Ht 5\' 8"  (1.727 m) Comment: per previous documentation  Wt 48.7 kg   SpO2 100%   BMI 16.32 kg/m   Gen: Frail, pleasant elderly female in no distress Pulm: Nonlabored breathing room air. Clear. CV: Regular rate and rhythm. No murmur, rub, or gallop. No JVD, no dependent edema. GI: Abdomen soft, non-tender, non-distended, with normoactive bowel sounds.  Ext: Left arm AVF +thrill, proximal bruising and dressing with hemostatic blood on dressing.  Skin: No other rashes, lesions or ulcers on visualized skin.  Neuro: Alert and oriented. No focal neurological deficits. Psych: Judgement and insight appear fair. Mood euthymic & affect congruent. Behavior is appropriate.    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  - Nephrology following, initial HD 9/5 thru AVF. Next planned 9/7 though initial access attempt unsuccessful. I suspect she will need a TDC. - Await outpatient HD spot. - On calcitriol, renvela  Normocytic anemia: Significant drop without active bleeding other than with HD punctures.  - Recheck CBC in AM.  Anticipate some AOCD.   History of T2DM: HbA1c 5.4% at last check, not on medications.   HTN:  - Continue home norvasc, losartan, coreg  Thrombocytopenia: Chronic, stable.   Hyperlipidemia:  - Continue statin  Severe protein calorie malnutrition: BMI 16.  - Dietitian consulted  Macrocytosis due to B12 and folate deficiency: Checked folate and B12 levels, both marginal.  - Started replacement daily 9/5.  [name], MD 04/21/2018, 2:25 PM

## 2018-04-22 LAB — RENAL FUNCTION PANEL
ALBUMIN: 3 g/dL — AB (ref 3.5–5.0)
ANION GAP: 12 (ref 5–15)
BUN: 60 mg/dL — AB (ref 8–23)
CALCIUM: 8.2 mg/dL — AB (ref 8.9–10.3)
CO2: 23 mmol/L (ref 22–32)
Chloride: 98 mmol/L (ref 98–111)
Creatinine, Ser: 4.17 mg/dL — ABNORMAL HIGH (ref 0.44–1.00)
GFR calc Af Amer: 11 mL/min — ABNORMAL LOW (ref >60)
GFR, EST NON AFRICAN AMERICAN: 9 mL/min — AB (ref >60)
Glucose, Bld: 96 mg/dL (ref 70–99)
PHOSPHORUS: 4.8 mg/dL — AB (ref 2.5–4.6)
Potassium: 3.5 mmol/L (ref 3.5–5.1)
SODIUM: 133 mmol/L — AB (ref 135–145)

## 2018-04-22 LAB — CBC
HCT: 29.8 % — ABNORMAL LOW (ref 36.0–46.0)
Hemoglobin: 9.7 g/dL — ABNORMAL LOW (ref 12.0–15.0)
MCH: 35 pg — ABNORMAL HIGH (ref 26.0–34.0)
MCHC: 32.6 g/dL (ref 30.0–36.0)
MCV: 107.6 fL — ABNORMAL HIGH (ref 78.0–100.0)
Platelets: 83 10*3/uL — ABNORMAL LOW (ref 150–400)
RBC: 2.77 MIL/uL — ABNORMAL LOW (ref 3.87–5.11)
RDW: 13.2 % (ref 11.5–15.5)
WBC: 5.7 10*3/uL (ref 4.0–10.5)

## 2018-04-22 NOTE — Progress Notes (Signed)
Subjective: Interval History: Patient doing well this AM. She states that her arm feels better. She is concerned that when she leaves the hospital she will have the same issues with her graft and need to be readmitted. She otherwise feels better and says that her appetite has improved. Denies SOB, CP, N/V, abdominal pain.   Objective: Vital signs in last 24 hours: Temp:  [97.7 F (36.5 C)-98.5 F (36.9 C)] 98 F (36.7 C) (09/08 0536) Pulse Rate:  [63-74] 63 (09/08 0536) Resp:  [16-18] 18 (09/08 0536) BP: (102-155)/(46-66) 102/52 (09/08 0536) SpO2:  [96 %-100 %] 96 % (09/08 0536) Weight:  [49.6 kg] 49.6 kg (09/08 0433) Weight change: 0.9 kg  Intake/Output from previous day: 09/07 0701 - 09/08 0700 In: 240 [P.O.:240] Out: 1094 [Urine:100]   Intake/Output this shift: Total I/O In: 120 [P.O.:120] Out: 100 [Urine:100]  General: Well nourished female in no acute distress Pulm: Good air movement with no wheezing or crackles  CV: RRR, no murmurs, no rubs  Abdomen: Active bowel sounds, soft, non-distended, no tenderness to palpation  Extremities: No LE edema, ecchymosis of the left anterior cubital fossa.   Lab Results: Recent Labs    04/21/18 0744 04/22/18 0417  WBC 4.7 5.7  HGB 9.9* 9.7*  HCT 31.1* 29.8*  PLT 79* 83*   BMET:  Recent Labs    04/21/18 0706 04/22/18 0417  NA 129* 133*  K 4.1 3.5  CL 95* 98  CO2 22 23  GLUCOSE 109* 96  BUN 98* 60*  CREATININE 5.98* 4.17*  CALCIUM 8.7* 8.2*   No results for input(s): PTH in the last 72 hours.   Iron Studies: No results for input(s): IRON, TIBC, TRANSFERRIN, FERRITIN in the last 72 hours.   Studies/Results: No results found.  Scheduled: . ALPRAZolam  1 mg Oral QHS  . amLODipine  5 mg Oral Daily  . carvedilol  12.5 mg Oral BID WC  . Chlorhexidine Gluconate Cloth  6 each Topical Q0600  . cyanocobalamin  1,000 mcg Intramuscular Daily  . [START ON 04/23/2018] doxercalciferol  2 mcg Intravenous Q M,W,F-HD  . folic  acid  1 mg Oral Daily  . losartan  25 mg Oral Daily  . lubiprostone  24 mcg Oral BID WC  . multivitamin  1 tablet Oral QHS  . rosuvastatin  5 mg Oral q1800  . sevelamer carbonate  1,600 mg Oral TID WC  . sodium chloride flush  3 mL Intravenous Q12H  . trimethoprim  50 mg Oral Daily   Assessment/Plan:  ESRD. Last HD session 09/07 and tolerated it well with ~1L removed. Continues to have issue with infiltration. Discussed that fistula should get stronger with increased usage. She continues to produce urine. Labs reviewed. BP stable. Continuing to wait on OP HD seat.    LOS: 4 days   [facility] 04/22/2018,6:54 AM

## 2018-04-22 NOTE — Progress Notes (Signed)
PROGRESS NOTE  Brief Narrative: [name] is a 77 y.o. female with a history of stage V CKD, T2DM, HTN, chronic thrombocytopenia who presented to the ED on the advice of her nephrologist, Dr. Audie Clear, to start hemodialysis. She was in no distress but had NAGMA with bicarb of 19, BUN 105, creatinine 4.94 and platelet count stable at 107k. Nephrology was consulted, recommended admission and inception of HD. Initial access of the fistula placed in 2016 was unsuccessful and vascular surgery was consulted. Fortunately, on reattempt 9/5, it was successfully cannulated and she underwent HD. Now awaiting arrangement of outpatient hemodialysis.   Subjective: Wants to go home but does not have an outpatient HD spot yet. No chest pain, dyspnea, leg swelling, N/V/D, itching, hiccups.   Objective: BP (!) 94/51 (BP Location: Right Arm)   Pulse 66   Temp 98.5 F (36.9 C) (Oral)   Resp 18   Ht 5\' 8"  (1.727 m) Comment: per previous documentation  Wt 49.6 kg   SpO2 98%   BMI 16.63 kg/m   Gen: Pleasant, elderly female in no distress Pulm: Nonlabored breathing room air. Clear. CV: Regular rate and rhythm. No murmur, rub, or gallop. No JVD, no dependent edema. GI: Abdomen soft, non-tender, non-distended, with normoactive bowel sounds.  Ext: Warm, no deformities Skin: No rashes, lesions or ulcers on visualized skin. Left forearm AVF +thrill, nontender without erythema. +ecchymosis. Neuro: Alert and oriented. No focal neurological deficits. Psych: Judgement and insight appear fair. Mood euthymic & affect congruent. Behavior is appropriate.    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  - Nephrology following, initial HD 9/5 thru AVF then 9/7, both requiring multiple sticking attempts. If this continues as an outpatient, would need TDC.  - Await outpatient HD spot. Hopeful for ?tmrw.  - On calcitriol, renvela  Normocytic anemia of chronic disease: Stabilized at 9.9  > 9.7.   - Monitor intermittently.   History of T2DM: HbA1c 5.4% at last check, not on medications.   HTN:  - Continue home norvasc, losartan, coreg. Hold for hypotension. Discussed with RN.  Thrombocytopenia: Chronic, stable.  - Monitor intermittently  Hyperlipidemia:  - Continue statin  Severe protein calorie malnutrition: BMI 16.  - Dietitian consulted  Macrocytosis due to B12 and folate deficiency: Checked folate and B12 levels, both marginal.  - Started replacement daily 9/5.  [name], MD 04/22/2018, 3:58 PM

## 2018-04-22 NOTE — Progress Notes (Signed)
Called into the patient's room by N.T.Patient has Non blanchable redness on both of buttocks 4cm x 5 cm. Barrier cream applied and covered with pink foam.

## 2018-04-23 LAB — RENAL FUNCTION PANEL
ANION GAP: 14 (ref 5–15)
Albumin: 3 g/dL — ABNORMAL LOW (ref 3.5–5.0)
BUN: 84 mg/dL — AB (ref 8–23)
CHLORIDE: 98 mmol/L (ref 98–111)
CO2: 21 mmol/L — AB (ref 22–32)
Calcium: 8.7 mg/dL — ABNORMAL LOW (ref 8.9–10.3)
Creatinine, Ser: 5.39 mg/dL — ABNORMAL HIGH (ref 0.44–1.00)
GFR calc Af Amer: 8 mL/min — ABNORMAL LOW (ref >60)
GFR calc non Af Amer: 7 mL/min — ABNORMAL LOW (ref >60)
Glucose, Bld: 109 mg/dL — ABNORMAL HIGH (ref 70–99)
POTASSIUM: 4.1 mmol/L (ref 3.5–5.1)
Phosphorus: 6.3 mg/dL — ABNORMAL HIGH (ref 2.5–4.6)
Sodium: 133 mmol/L — ABNORMAL LOW (ref 135–145)

## 2018-04-23 LAB — HEPATITIS B SURFACE ANTIBODY,QUALITATIVE: HEP B S AB: NONREACTIVE

## 2018-04-23 LAB — GLUCOSE, CAPILLARY
Glucose-Capillary: 105 mg/dL — ABNORMAL HIGH (ref 70–99)
Glucose-Capillary: 182 mg/dL — ABNORMAL HIGH (ref 70–99)

## 2018-04-23 LAB — HEPATITIS B SURFACE ANTIGEN: Hepatitis B Surface Ag: NEGATIVE

## 2018-04-23 LAB — HEPATITIS B CORE ANTIBODY, TOTAL: HEP B C TOTAL AB: NEGATIVE

## 2018-04-23 MED ORDER — DOXERCALCIFEROL 4 MCG/2ML IV SOLN: 2.0000 ug | INTRAVENOUS | Status: DC

## 2018-04-23 MED ORDER — CHLORHEXIDINE GLUCONATE CLOTH 2 % EX PADS: 6.0000 | MEDICATED_PAD | Freq: Every day | CUTANEOUS | Status: DC

## 2018-04-23 MED ORDER — FOLIC ACID 1 MG PO TABS: 1.0000 mg | ORAL_TABLET | Freq: Every day | ORAL | 0 refills | Status: DC

## 2018-04-23 MED ORDER — CYANOCOBALAMIN 500 MCG PO TABS: 500.0000 ug | ORAL_TABLET | Freq: Every day | ORAL | 0 refills | Status: DC

## 2018-04-23 NOTE — Care Management Note (Signed)
Case Management Note  Patient Details  Name: [name] MRN: [id number] Date of Birth: 01-10-1941  Subjective/Objective:  Pt presented for ESRD- new start to HD. CLIP is completed. Patient is from home with support of husband. Husband will transport to HD. Patient is aware to report to [facility] Wednesday chair time at 1:20 and arrive at 1:00 pm for paperwork.                   Action/Plan: No further needs from CM at this time.   Expected Discharge Date:  04/23/18               Expected Discharge Plan:  Home/Self Care  In-House Referral:  NA  Discharge planning Services  CM Consult  Post Acute Care Choice:  NA Choice offered to:  Patient, NA  DME Arranged:  N/A DME Agency:  NA  HH Arranged:  NA HH Agency:  NA  Status of Service:  Completed, signed off  If discussed at [place] of Stay Meetings, dates discussed:    Additional Comments:  Bethena Roys, RN 04/23/2018, 1:14 PM

## 2018-04-23 NOTE — Progress Notes (Signed)
Subjective: Interval History: Patient is doing well this AM. She continues to have increase appetite compared to admission. She would like to go home as soon as possible. She would prefer a TTS HD schedule. We discussed that we are currently awaiting the CLIP process. She voices understanding. All questions and concerns addressed.   Objective: Vital signs in last 24 hours: Temp:  [98 F (36.7 C)-99.4 F (37.4 C)] 98.2 F (36.8 C) (09/09 0458) Pulse Rate:  [63-72] 63 (09/09 0458) Resp:  [18] 18 (09/09 0458) BP: (89-117)/(50-71) 117/71 (09/09 0458) SpO2:  [94 %-98 %] 94 % (09/09 0458) Weight:  [49.5 kg] 49.5 kg (09/09 0310) Weight change: -0.1 kg  Intake/Output from previous day: 09/08 0701 - 09/09 0700 In: 600 [P.O.:600] Out: 150 [Urine:150]   Intake/Output this shift: Total I/O In: 120 [P.O.:120] Out: 0   General: Well nourished female in no acute distress Pulm: Good air movement with no wheezing or crackles  CV: RRR, no murmurs, no rubs  Extremities: Pulses palpable in all extremities, no LE edema   Lab Results: Recent Labs    04/21/18 0744 04/22/18 0417  WBC 4.7 5.7  HGB 9.9* 9.7*  HCT 31.1* 29.8*  PLT 79* 83*   BMET:  Recent Labs    04/21/18 0706 04/22/18 0417  NA 129* 133*  K 4.1 3.5  CL 95* 98  CO2 22 23  GLUCOSE 109* 96  BUN 98* 60*  CREATININE 5.98* 4.17*  CALCIUM 8.7* 8.2*   No results for input(s): PTH in the last 72 hours.   Iron Studies: No results for input(s): IRON, TIBC, TRANSFERRIN, FERRITIN in the last 72 hours.   Studies/Results: No results found.  Scheduled: . ALPRAZolam  1 mg Oral QHS  . amLODipine  5 mg Oral Daily  . carvedilol  12.5 mg Oral BID WC  . Chlorhexidine Gluconate Cloth  6 each Topical Q0600  . cyanocobalamin  1,000 mcg Intramuscular Daily  . doxercalciferol  2 mcg Intravenous Q M,W,F-HD  . folic acid  1 mg Oral Daily  . losartan  25 mg Oral Daily  . lubiprostone  24 mcg Oral BID WC  . multivitamin  1 tablet Oral  QHS  . rosuvastatin  5 mg Oral q1800  . sevelamer carbonate  1,600 mg Oral TID WC  . sodium chloride flush  3 mL Intravenous Q12H  . trimethoprim  50 mg Oral Daily   Assessment/Plan:  ESRD. Last HD session 09/07. Continues to have issue with infiltration. Discussed that fistula should get stronger with increased usage. She continues to produce urine. Labs reviewed. BP stable. Continuing to wait on OP HD seat. Not planning for HD today, if placed can likely discharge home.     LOS: 5 days   [facility] 04/23/2018,6:54 AM

## 2018-04-23 NOTE — Discharge Summary (Signed)
Physician Discharge Summary  [name] EXB:[id number] DOB: [birth year] DOA: 04/17/2018  PCP: [name], MD  Admit date: 04/17/2018 Discharge date: 04/23/2018  Admitted From: Home Disposition: Home   Recommendations for Outpatient Follow-up:  1. Follow up with PCP in 1-2 weeks 2. Monitor BMP.  3. Start outpatient hemodialysis MWF at 1:20pm at [facility]: None Equipment/Devices: None Discharge Condition: Stable, improved CODE STATUS: Full Diet recommendation: Renal  Brief/Interim Summary: [name] is a 77 y.o. female with a history of stage V CKD, T2DM, HTN, chronic thrombocytopenia who presented to the ED on the advice of her nephrologist, Dr. Audie Clear, to start hemodialysis. She was in no distress but had NAGMA with bicarb of 19, BUN 105, creatinine 4.94 and platelet count stable at 107k. Nephrology was consulted, recommended admission and inception of HD. Initial access of the fistula placed in 2016 was unsuccessful and vascular surgery was consulted. Fortunately, on reattempt 9/5, it was successfully cannulated and she underwent HD. Now awaiting arrangement of outpatient hemodialysis.   Discharge Diagnoses:  Principal Problem:   ESRD needing dialysis [facility]) Active Problems:   Thrombocytopenia ([place])   Anxiety state   Essential hypertension   Chronic kidney disease (CKD), stage V (HCC)   Pressure injury of skin   Protein-calorie malnutrition, severe  Stage V CKD now progressed to ESRD: Patient has received outpatient hemodialysis chair at [facility] MWF 13:20:  - Does not appear overloaded.  - Nephrology following, initial HD 9/5 thru AVF then 9/7, both requiring multiple sticking attempts. If this continues as an outpatient, would need TDC.  - On calcitriol, renvela  Normocytic anemia of chronic disease: Stabilized at 9.9 > 9.7.   - Monitor intermittently.   History of T2DM: HbA1c 5.4% at last check, not on medications.   HTN:  - Continue home norvasc,  losartan, coreg. Hold for hypotension. Discussed with RN.  Thrombocytopenia: Chronic, stable.  - Monitor intermittently  Hyperlipidemia:  - Continue statin  Severe protein calorie malnutrition: BMI 16.  - Dietitian consulted  Macrocytosis due to B12 and folate deficiency: Checked folate and B12 levels, both marginal.  - Started replacement daily 9/5.  Discharge Instructions Discharge Instructions    Diet - low sodium heart healthy   Complete by:  As directed    Discharge instructions   Complete by:  As directed    - Start taking vitamin B12 and folate supplements (sent to your pharmacy) - Start going to hemodialysis Monday Wednesday and Friday at [facility].  - If you develop shortness of breath, severe nausea, vomiting, abdominal pain, or other severe symptoms, seek medical attention right away.   Increase activity slowly   Complete by:  As directed      Allergies as of 04/23/2018      Reactions   Lipitor [atorvastatin] Other (See Comments)   Stomach pain   Strawberry Extract Rash      Medication List    TAKE these medications   ALPRAZolam 1 MG tablet Commonly known as:  XANAX Take 1 mg by mouth at bedtime.   AMITIZA 24 MCG capsule Generic drug:  lubiprostone Take 24 mcg by mouth 2 (two) times daily with a meal.   amLODipine 5 MG tablet Commonly known as:  NORVASC Take 1 tablet (5 mg total) by mouth daily.   calcitRIOL 0.5 MCG capsule Commonly known as:  ROCALTROL Take 0.5 mcg by mouth daily.   carvedilol 12.5 MG tablet Commonly known as:  COREG TAKE 1 TABLET(12.5 MG)  BY MOUTH TWICE DAILY What changed:  See the new instructions.   folic acid 1 MG tablet Commonly known as:  FOLVITE Take 1 tablet (1 mg total) by mouth daily. Start taking on:  04/24/2018   furosemide 20 MG tablet Commonly known as:  LASIX Take 20 mg by mouth daily.   losartan 25 MG tablet Commonly known as:  COZAAR Take 1 tablet (25 mg total) by mouth daily.   rosuvastatin 5 MG  tablet Commonly known as:  CRESTOR TAKE 1 TABLET(5 MG) BY MOUTH DAILY What changed:    how much to take  how to take this  when to take this  additional instructions   sevelamer carbonate 800 MG tablet Commonly known as:  RENVELA Take 1,600 mg 3 (three) times daily with meals by mouth.   sodium bicarbonate 650 MG tablet Take 650 mg by mouth 2 (two) times daily.   trimethoprim 100 MG tablet Commonly known as:  TRIMPEX Take 50 mg by mouth daily.   VELTASSA 8.4 g packet Generic drug:  patiromer Take 8.4 g by mouth daily after lunch.   vitamin B-12 500 MCG tablet Commonly known as:  CYANOCOBALAMIN Take 1 tablet (500 mcg total) by mouth daily.      Follow-up Information    [name], MD. Schedule an appointment as soon as possible for a visit.   Specialty:  Family Medicine Contact information: [address] [place] [address] [place], [facility] Follow up.   Contact information: [place] [address] [phone]          Allergies  Allergen Reactions  . Lipitor [Atorvastatin] Other (See Comments)    Stomach pain  . Strawberry Extract Rash    Consultations:  Nephrology  Procedures/Studies: Hemodialysis  Subjective: Feels well, wants to go home. Making urine, no other complaints.   Discharge Exam: Vitals:   04/23/18 0458 04/23/18 0731  BP: 117/71 (!) 124/51  Pulse: 63 65  Resp: 18 20  Temp: 98.2 F (36.8 C) 97.8 F (36.6 C)  SpO2: 94% 95%   General: Pt is alert, awake, not in acute distress Cardiovascular: RRR, S1/S2 +, no rubs, no gallops Respiratory: CTA bilaterally, no wheezing, no rhonchi Abdominal: Soft, NT, ND, bowel sounds + Extremities: No edema, no cyanosis  Labs: BNP (last 3 results) No results for input(s): BNP in the last 8760 hours. Basic Metabolic Panel: Recent Labs  Lab 04/19/18 0423 04/20/18 0657 04/21/18 0706 04/22/18 0417 04/23/18 0726   NA 131* 132* 129* 133* 133*  K 4.6 3.6 4.1 3.5 4.1  CL 104 98 95* 98 98  CO2 17* 23 22 23  21*  GLUCOSE 135* 117* 109* 96 109*  BUN 120* 77* 98* 60* 84*  CREATININE 5.35* 4.77* 5.98* 4.17* 5.39*  CALCIUM 8.4* 8.7* 8.7* 8.2* 8.7*  PHOS 6.5* 4.7* 5.8* 4.8* 6.3*   Liver Function Tests: Recent Labs  Lab 04/19/18 0423 04/20/18 0657 04/21/18 0706 04/22/18 0417 04/23/18 0726  ALBUMIN 3.2* 3.4* 3.2* 3.0* 3.0*   No results for input(s): LIPASE, AMYLASE in the last 168 hours. No results for input(s): AMMONIA in the last 168 hours. CBC: Recent Labs  Lab 04/17/18 1754 04/21/18 0744 04/22/18 0417  WBC 6.6 4.7 5.7  HGB 12.6 9.9* 9.7*  HCT 39.7 31.1* 29.8*  MCV 109.1* 109.1* 107.6*  PLT 107* 79* 83*   Cardiac Enzymes: No results for input(s): CKTOTAL, CKMB, CKMBINDEX, TROPONINI in  the last 168 hours. BNP: Invalid input(s): POCBNP CBG: Recent Labs  Lab 04/23/18 0733 04/23/18 1127  GLUCAP 105* 182*   D-Dimer No results for input(s): DDIMER in the last 72 hours. Hgb A1c No results for input(s): HGBA1C in the last 72 hours. Lipid Profile No results for input(s): CHOL, HDL, LDLCALC, TRIG, CHOLHDL, LDLDIRECT in the last 72 hours. Thyroid function studies No results for input(s): TSH, T4TOTAL, T3FREE, THYROIDAB in the last 72 hours.  Invalid input(s): FREET3 Anemia work up No results for input(s): VITAMINB12, FOLATE, FERRITIN, TIBC, IRON, RETICCTPCT in the last 72 hours. Urinalysis    Component Value Date/Time   COLORURINE YELLOW 09/26/2016 1744   APPEARANCEUR TURBID (A) 09/26/2016 1744   LABSPEC 1.005 09/26/2016 1744   PHURINE 5.0 09/26/2016 1744   GLUCOSEU NEGATIVE 09/26/2016 1744   HGBUR SMALL (A) 09/26/2016 1744   BILIRUBINUR NEGATIVE 09/26/2016 1744   KETONESUR NEGATIVE 09/26/2016 1744   PROTEINUR 100 (A) 09/26/2016 1744   UROBILINOGEN 0.2 10/14/2008 0612   NITRITE NEGATIVE 09/26/2016 1744   LEUKOCYTESUR LARGE (A) 09/26/2016 1744    Microbiology Recent  Results (from the past 240 hour(s))  MRSA PCR Screening     Status: None   Collection Time: 04/19/18  8:31 PM  Result Value Ref Range Status   MRSA by PCR NEGATIVE NEGATIVE Final    Comment:        The GeneXpert MRSA Assay (FDA approved for NASAL specimens only), is one component of a comprehensive MRSA colonization surveillance program. It is not intended to diagnose MRSA infection nor to guide or monitor treatment for MRSA infections. Performed at [facility], [place] [address], [place], [place] [address]     Time coordinating discharge: Approximately 40 minutes  [name], MD  [facility] 04/23/2018, 12:48 PM Pager [phone]

## 2018-04-25 DIAGNOSIS — D689 Coagulation defect, unspecified: Secondary | ICD-10-CM | POA: Diagnosis not present

## 2018-04-25 DIAGNOSIS — Z23 Encounter for immunization: Secondary | ICD-10-CM | POA: Diagnosis not present

## 2018-04-25 DIAGNOSIS — N186 End stage renal disease: Secondary | ICD-10-CM | POA: Diagnosis not present

## 2018-04-25 DIAGNOSIS — N2581 Secondary hyperparathyroidism of renal origin: Secondary | ICD-10-CM | POA: Diagnosis not present

## 2018-04-25 DIAGNOSIS — D649 Anemia, unspecified: Secondary | ICD-10-CM | POA: Diagnosis not present

## 2018-04-27 DIAGNOSIS — N2581 Secondary hyperparathyroidism of renal origin: Secondary | ICD-10-CM | POA: Diagnosis not present

## 2018-04-27 DIAGNOSIS — N186 End stage renal disease: Secondary | ICD-10-CM | POA: Diagnosis not present

## 2018-04-27 DIAGNOSIS — D649 Anemia, unspecified: Secondary | ICD-10-CM | POA: Diagnosis not present

## 2018-04-27 DIAGNOSIS — D689 Coagulation defect, unspecified: Secondary | ICD-10-CM | POA: Diagnosis not present

## 2018-04-27 DIAGNOSIS — Z23 Encounter for immunization: Secondary | ICD-10-CM | POA: Diagnosis not present

## 2018-04-30 DIAGNOSIS — N2581 Secondary hyperparathyroidism of renal origin: Secondary | ICD-10-CM | POA: Diagnosis not present

## 2018-04-30 DIAGNOSIS — D649 Anemia, unspecified: Secondary | ICD-10-CM | POA: Diagnosis not present

## 2018-04-30 DIAGNOSIS — N186 End stage renal disease: Secondary | ICD-10-CM | POA: Diagnosis not present

## 2018-04-30 DIAGNOSIS — D689 Coagulation defect, unspecified: Secondary | ICD-10-CM | POA: Diagnosis not present

## 2018-04-30 DIAGNOSIS — Z23 Encounter for immunization: Secondary | ICD-10-CM | POA: Diagnosis not present

## 2018-05-02 ENCOUNTER — Inpatient Hospital Stay: Payer: Medicare HMO

## 2018-05-02 DIAGNOSIS — D649 Anemia, unspecified: Secondary | ICD-10-CM | POA: Diagnosis not present

## 2018-05-02 DIAGNOSIS — D689 Coagulation defect, unspecified: Secondary | ICD-10-CM | POA: Diagnosis not present

## 2018-05-02 DIAGNOSIS — Z23 Encounter for immunization: Secondary | ICD-10-CM | POA: Diagnosis not present

## 2018-05-02 DIAGNOSIS — N186 End stage renal disease: Secondary | ICD-10-CM | POA: Diagnosis not present

## 2018-05-02 DIAGNOSIS — N2581 Secondary hyperparathyroidism of renal origin: Secondary | ICD-10-CM | POA: Diagnosis not present

## 2018-05-03 DIAGNOSIS — L02229 Furuncle of trunk, unspecified: Secondary | ICD-10-CM | POA: Diagnosis not present

## 2018-05-03 DIAGNOSIS — B9689 Other specified bacterial agents as the cause of diseases classified elsewhere: Secondary | ICD-10-CM | POA: Diagnosis not present

## 2018-05-04 DIAGNOSIS — N2581 Secondary hyperparathyroidism of renal origin: Secondary | ICD-10-CM | POA: Diagnosis not present

## 2018-05-04 DIAGNOSIS — N186 End stage renal disease: Secondary | ICD-10-CM | POA: Diagnosis not present

## 2018-05-04 DIAGNOSIS — Z23 Encounter for immunization: Secondary | ICD-10-CM | POA: Diagnosis not present

## 2018-05-04 DIAGNOSIS — D689 Coagulation defect, unspecified: Secondary | ICD-10-CM | POA: Diagnosis not present

## 2018-05-04 DIAGNOSIS — D649 Anemia, unspecified: Secondary | ICD-10-CM | POA: Diagnosis not present

## 2018-05-07 DIAGNOSIS — D649 Anemia, unspecified: Secondary | ICD-10-CM | POA: Diagnosis not present

## 2018-05-07 DIAGNOSIS — Z23 Encounter for immunization: Secondary | ICD-10-CM | POA: Diagnosis not present

## 2018-05-07 DIAGNOSIS — N2581 Secondary hyperparathyroidism of renal origin: Secondary | ICD-10-CM | POA: Diagnosis not present

## 2018-05-07 DIAGNOSIS — D689 Coagulation defect, unspecified: Secondary | ICD-10-CM | POA: Diagnosis not present

## 2018-05-07 DIAGNOSIS — N186 End stage renal disease: Secondary | ICD-10-CM | POA: Diagnosis not present

## 2018-05-09 ENCOUNTER — Telehealth: Payer: Self-pay | Admitting: Nurse Practitioner

## 2018-05-09 DIAGNOSIS — Z23 Encounter for immunization: Secondary | ICD-10-CM | POA: Diagnosis not present

## 2018-05-09 DIAGNOSIS — N2581 Secondary hyperparathyroidism of renal origin: Secondary | ICD-10-CM | POA: Diagnosis not present

## 2018-05-09 DIAGNOSIS — D689 Coagulation defect, unspecified: Secondary | ICD-10-CM | POA: Diagnosis not present

## 2018-05-09 DIAGNOSIS — D649 Anemia, unspecified: Secondary | ICD-10-CM | POA: Diagnosis not present

## 2018-05-09 DIAGNOSIS — N186 End stage renal disease: Secondary | ICD-10-CM | POA: Diagnosis not present

## 2018-05-09 NOTE — Telephone Encounter (Signed)
S/w pt started emergent dialysis and was put in hospital one month ago.  On Monday at Dialysis pt was oscillated and stated heard fluid in the left lung.  Pt stated about one week ago SOB, fatigued, chest and heart pounding and can hear in pt's ears. Also reported lightheaded and dizzy.  Pt denies nausea, pre-syncope, and chest pain.  Pt does not want to go back in the hospital and wanted to be seen. Will call pt back on cell phone with Lori's recommendations, pt will be at dialysis today. Will send to Cecille Rubin to advise.

## 2018-05-09 NOTE — Telephone Encounter (Signed)
Pt is advised to go to ER if symptoms get worse, otherwise Cecille Rubin will see pt next week, 10/1 due to dialysis.

## 2018-05-09 NOTE — Telephone Encounter (Signed)
Let's see her next week

## 2018-05-09 NOTE — Telephone Encounter (Signed)
New Message:   Patient calling stating she dose not feel well at all. She would like to come in tomorrow, we do not have anything for her. She wanted to speak with the nurse she dose no;t know what she should do. Please calll Patient back. Patient would like to called on her cell phone if she is not at home has a appt [phone]

## 2018-05-11 DIAGNOSIS — Z992 Dependence on renal dialysis: Secondary | ICD-10-CM | POA: Diagnosis not present

## 2018-05-11 DIAGNOSIS — T82868A Thrombosis of vascular prosthetic devices, implants and grafts, initial encounter: Secondary | ICD-10-CM | POA: Diagnosis not present

## 2018-05-11 DIAGNOSIS — N186 End stage renal disease: Secondary | ICD-10-CM | POA: Diagnosis not present

## 2018-05-12 DIAGNOSIS — D649 Anemia, unspecified: Secondary | ICD-10-CM | POA: Diagnosis not present

## 2018-05-12 DIAGNOSIS — N186 End stage renal disease: Secondary | ICD-10-CM | POA: Diagnosis not present

## 2018-05-12 DIAGNOSIS — N2581 Secondary hyperparathyroidism of renal origin: Secondary | ICD-10-CM | POA: Diagnosis not present

## 2018-05-12 DIAGNOSIS — Z23 Encounter for immunization: Secondary | ICD-10-CM | POA: Diagnosis not present

## 2018-05-12 DIAGNOSIS — D689 Coagulation defect, unspecified: Secondary | ICD-10-CM | POA: Diagnosis not present

## 2018-05-14 DIAGNOSIS — N2581 Secondary hyperparathyroidism of renal origin: Secondary | ICD-10-CM | POA: Diagnosis not present

## 2018-05-14 DIAGNOSIS — D649 Anemia, unspecified: Secondary | ICD-10-CM | POA: Diagnosis not present

## 2018-05-14 DIAGNOSIS — N186 End stage renal disease: Secondary | ICD-10-CM | POA: Diagnosis not present

## 2018-05-14 DIAGNOSIS — Z992 Dependence on renal dialysis: Secondary | ICD-10-CM | POA: Diagnosis not present

## 2018-05-14 DIAGNOSIS — Z23 Encounter for immunization: Secondary | ICD-10-CM | POA: Diagnosis not present

## 2018-05-14 DIAGNOSIS — I129 Hypertensive chronic kidney disease with stage 1 through stage 4 chronic kidney disease, or unspecified chronic kidney disease: Secondary | ICD-10-CM | POA: Diagnosis not present

## 2018-05-14 DIAGNOSIS — D689 Coagulation defect, unspecified: Secondary | ICD-10-CM | POA: Diagnosis not present

## 2018-05-15 ENCOUNTER — Encounter: Payer: Self-pay | Admitting: Nurse Practitioner

## 2018-05-15 ENCOUNTER — Ambulatory Visit: Payer: Medicare HMO | Admitting: Nurse Practitioner

## 2018-05-15 ENCOUNTER — Inpatient Hospital Stay (HOSPITAL_COMMUNITY)
Admission: EM | Admit: 2018-05-15 | Discharge: 2018-05-19 | DRG: 286 | Disposition: A | Discharge status: Home / Self Care | Payer: Medicare HMO | Source: Ambulatory Visit | Attending: Family Medicine | Admitting: Family Medicine

## 2018-05-15 ENCOUNTER — Encounter (HOSPITAL_COMMUNITY): Payer: Self-pay | Admitting: Emergency Medicine

## 2018-05-15 ENCOUNTER — Other Ambulatory Visit: Payer: Self-pay

## 2018-05-15 ENCOUNTER — Emergency Department (HOSPITAL_COMMUNITY): Payer: Medicare HMO

## 2018-05-15 VITALS — BP 100/60 | HR 121 | Ht 68.0 in | Wt 117.8 lb

## 2018-05-15 DIAGNOSIS — D509 Iron deficiency anemia, unspecified: Secondary | ICD-10-CM | POA: Diagnosis present

## 2018-05-15 DIAGNOSIS — K5790 Diverticulosis of intestine, part unspecified, without perforation or abscess without bleeding: Secondary | ICD-10-CM | POA: Diagnosis present

## 2018-05-15 DIAGNOSIS — F411 Generalized anxiety disorder: Secondary | ICD-10-CM | POA: Diagnosis present

## 2018-05-15 DIAGNOSIS — Z9842 Cataract extraction status, left eye: Secondary | ICD-10-CM

## 2018-05-15 DIAGNOSIS — R072 Precordial pain: Secondary | ICD-10-CM | POA: Diagnosis not present

## 2018-05-15 DIAGNOSIS — N186 End stage renal disease: Secondary | ICD-10-CM | POA: Diagnosis present

## 2018-05-15 DIAGNOSIS — I10 Essential (primary) hypertension: Secondary | ICD-10-CM

## 2018-05-15 DIAGNOSIS — L8990 Pressure ulcer of unspecified site, unspecified stage: Secondary | ICD-10-CM

## 2018-05-15 DIAGNOSIS — I454 Nonspecific intraventricular block: Secondary | ICD-10-CM | POA: Diagnosis present

## 2018-05-15 DIAGNOSIS — I491 Atrial premature depolarization: Secondary | ICD-10-CM | POA: Diagnosis present

## 2018-05-15 DIAGNOSIS — E8889 Other specified metabolic disorders: Secondary | ICD-10-CM | POA: Diagnosis present

## 2018-05-15 DIAGNOSIS — I5041 Acute combined systolic (congestive) and diastolic (congestive) heart failure: Secondary | ICD-10-CM | POA: Diagnosis not present

## 2018-05-15 DIAGNOSIS — I350 Nonrheumatic aortic (valve) stenosis: Secondary | ICD-10-CM

## 2018-05-15 DIAGNOSIS — E7800 Pure hypercholesterolemia, unspecified: Secondary | ICD-10-CM | POA: Diagnosis not present

## 2018-05-15 DIAGNOSIS — Z79899 Other long term (current) drug therapy: Secondary | ICD-10-CM

## 2018-05-15 DIAGNOSIS — I259 Chronic ischemic heart disease, unspecified: Secondary | ICD-10-CM

## 2018-05-15 DIAGNOSIS — D649 Anemia, unspecified: Secondary | ICD-10-CM | POA: Diagnosis not present

## 2018-05-15 DIAGNOSIS — I25810 Atherosclerosis of coronary artery bypass graft(s) without angina pectoris: Secondary | ICD-10-CM | POA: Diagnosis present

## 2018-05-15 DIAGNOSIS — I132 Hypertensive heart and chronic kidney disease with heart failure and with stage 5 chronic kidney disease, or end stage renal disease: Principal | ICD-10-CM | POA: Diagnosis present

## 2018-05-15 DIAGNOSIS — Z992 Dependence on renal dialysis: Secondary | ICD-10-CM

## 2018-05-15 DIAGNOSIS — Z8719 Personal history of other diseases of the digestive system: Secondary | ICD-10-CM

## 2018-05-15 DIAGNOSIS — Z792 Long term (current) use of antibiotics: Secondary | ICD-10-CM

## 2018-05-15 DIAGNOSIS — I248 Other forms of acute ischemic heart disease: Secondary | ICD-10-CM | POA: Diagnosis not present

## 2018-05-15 DIAGNOSIS — E1165 Type 2 diabetes mellitus with hyperglycemia: Secondary | ICD-10-CM | POA: Diagnosis not present

## 2018-05-15 DIAGNOSIS — Z888 Allergy status to other drugs, medicaments and biological substances status: Secondary | ICD-10-CM

## 2018-05-15 DIAGNOSIS — Z87891 Personal history of nicotine dependence: Secondary | ICD-10-CM

## 2018-05-15 DIAGNOSIS — D696 Thrombocytopenia, unspecified: Secondary | ICD-10-CM | POA: Diagnosis present

## 2018-05-15 DIAGNOSIS — Z9841 Cataract extraction status, right eye: Secondary | ICD-10-CM

## 2018-05-15 DIAGNOSIS — I420 Dilated cardiomyopathy: Secondary | ICD-10-CM | POA: Diagnosis present

## 2018-05-15 DIAGNOSIS — Z961 Presence of intraocular lens: Secondary | ICD-10-CM | POA: Diagnosis present

## 2018-05-15 DIAGNOSIS — E1140 Type 2 diabetes mellitus with diabetic neuropathy, unspecified: Secondary | ICD-10-CM | POA: Diagnosis present

## 2018-05-15 DIAGNOSIS — I252 Old myocardial infarction: Secondary | ICD-10-CM

## 2018-05-15 DIAGNOSIS — I2582 Chronic total occlusion of coronary artery: Secondary | ICD-10-CM | POA: Diagnosis present

## 2018-05-15 DIAGNOSIS — Z681 Body mass index (BMI) 19 or less, adult: Secondary | ICD-10-CM

## 2018-05-15 DIAGNOSIS — I447 Left bundle-branch block, unspecified: Secondary | ICD-10-CM | POA: Diagnosis present

## 2018-05-15 DIAGNOSIS — I4891 Unspecified atrial fibrillation: Secondary | ICD-10-CM | POA: Diagnosis not present

## 2018-05-15 DIAGNOSIS — I5043 Acute on chronic combined systolic (congestive) and diastolic (congestive) heart failure: Secondary | ICD-10-CM | POA: Diagnosis present

## 2018-05-15 DIAGNOSIS — E785 Hyperlipidemia, unspecified: Secondary | ICD-10-CM | POA: Diagnosis present

## 2018-05-15 DIAGNOSIS — Z951 Presence of aortocoronary bypass graft: Secondary | ICD-10-CM

## 2018-05-15 DIAGNOSIS — E119 Type 2 diabetes mellitus without complications: Secondary | ICD-10-CM

## 2018-05-15 DIAGNOSIS — D631 Anemia in chronic kidney disease: Secondary | ICD-10-CM | POA: Diagnosis present

## 2018-05-15 DIAGNOSIS — E871 Hypo-osmolality and hyponatremia: Secondary | ICD-10-CM | POA: Diagnosis not present

## 2018-05-15 DIAGNOSIS — Z881 Allergy status to other antibiotic agents status: Secondary | ICD-10-CM

## 2018-05-15 DIAGNOSIS — I5021 Acute systolic (congestive) heart failure: Secondary | ICD-10-CM | POA: Diagnosis not present

## 2018-05-15 DIAGNOSIS — R64 Cachexia: Secondary | ICD-10-CM | POA: Diagnosis present

## 2018-05-15 DIAGNOSIS — E1122 Type 2 diabetes mellitus with diabetic chronic kidney disease: Secondary | ICD-10-CM | POA: Diagnosis present

## 2018-05-15 DIAGNOSIS — Z91018 Allergy to other foods: Secondary | ICD-10-CM

## 2018-05-15 DIAGNOSIS — R0789 Other chest pain: Secondary | ICD-10-CM | POA: Diagnosis not present

## 2018-05-15 DIAGNOSIS — I700 Atherosclerosis of aorta: Secondary | ICD-10-CM | POA: Diagnosis present

## 2018-05-15 DIAGNOSIS — R079 Chest pain, unspecified: Secondary | ICD-10-CM | POA: Diagnosis not present

## 2018-05-15 DIAGNOSIS — I2510 Atherosclerotic heart disease of native coronary artery without angina pectoris: Secondary | ICD-10-CM | POA: Diagnosis present

## 2018-05-15 LAB — BASIC METABOLIC PANEL
Anion gap: 11 (ref 5–15)
BUN: 22 mg/dL (ref 8–23)
CALCIUM: 8.6 mg/dL — AB (ref 8.9–10.3)
CHLORIDE: 95 mmol/L — AB (ref 98–111)
CO2: 27 mmol/L (ref 22–32)
CREATININE: 3.29 mg/dL — AB (ref 0.44–1.00)
GFR, EST AFRICAN AMERICAN: 15 mL/min — AB (ref >60)
GFR, EST NON AFRICAN AMERICAN: 13 mL/min — AB (ref >60)
Glucose, Bld: 229 mg/dL — ABNORMAL HIGH (ref 70–99)
Potassium: 4.1 mmol/L (ref 3.5–5.1)
SODIUM: 133 mmol/L — AB (ref 135–145)

## 2018-05-15 LAB — TROPONIN I
Troponin I: 1.35 ng/mL (ref <0.03)
Troponin I: 1.83 ng/mL (ref <0.03)

## 2018-05-15 LAB — I-STAT TROPONIN, ED: TROPONIN I, POC: 0.96 ng/mL — AB (ref 0.00–0.08)

## 2018-05-15 LAB — POC OCCULT BLOOD, ED: FECAL OCCULT BLD: POSITIVE — AB

## 2018-05-15 LAB — CBC
HCT: 23.7 % — ABNORMAL LOW (ref 36.0–46.0)
Hemoglobin: 7.3 g/dL — ABNORMAL LOW (ref 12.0–15.0)
MCH: 35.8 pg — ABNORMAL HIGH (ref 26.0–34.0)
MCHC: 30.8 g/dL (ref 30.0–36.0)
MCV: 116.2 fL — AB (ref 78.0–100.0)
Platelets: 132 10*3/uL — ABNORMAL LOW (ref 150–400)
RBC: 2.04 MIL/uL — AB (ref 3.87–5.11)
RDW: 14.7 % (ref 11.5–15.5)
WBC: 7.3 10*3/uL (ref 4.0–10.5)

## 2018-05-15 LAB — PREPARE RBC (CROSSMATCH)

## 2018-05-15 MED ORDER — AMLODIPINE BESYLATE 5 MG PO TABS
5.0000 mg | ORAL_TABLET | Freq: Every day | ORAL | Status: DC
  Administered 2018-05-15: 5 mg via ORAL
  Filled 2018-05-15: qty 1

## 2018-05-15 MED ORDER — FOLIC ACID 1 MG PO TABS
1.0000 mg | ORAL_TABLET | Freq: Every day | ORAL | Status: DC
  Administered 2018-05-16 – 2018-05-19 (×3): 1 mg via ORAL
  Filled 2018-05-15 (×3): qty 1

## 2018-05-15 MED ORDER — ONDANSETRON HCL 4 MG PO TABS: 4.0000 mg | ORAL_TABLET | Freq: Four times a day (QID) | ORAL | Status: DC | PRN

## 2018-05-15 MED ORDER — SODIUM CHLORIDE 0.9 % IV SOLN
INTRAVENOUS | Status: DC | PRN
  Administered 2018-05-15: 250 mL via INTRAVENOUS

## 2018-05-15 MED ORDER — ACETAMINOPHEN 325 MG PO TABS
650.0000 mg | ORAL_TABLET | Freq: Four times a day (QID) | ORAL | Status: DC | PRN
  Filled 2018-05-15: qty 2

## 2018-05-15 MED ORDER — CALCIUM CARBONATE ANTACID 1250 MG/5ML PO SUSP
500.0000 mg | Freq: Four times a day (QID) | ORAL | Status: DC | PRN
  Filled 2018-05-15: qty 5

## 2018-05-15 MED ORDER — DOCUSATE SODIUM 283 MG RE ENEM
1.0000 | ENEMA | RECTAL | Status: DC | PRN
  Filled 2018-05-15: qty 1

## 2018-05-15 MED ORDER — ROSUVASTATIN CALCIUM 10 MG PO TABS
5.0000 mg | ORAL_TABLET | Freq: Every day | ORAL | Status: DC
  Administered 2018-05-15 – 2018-05-18 (×4): 5 mg via ORAL
  Filled 2018-05-15 (×5): qty 1

## 2018-05-15 MED ORDER — CARVEDILOL 12.5 MG PO TABS
12.5000 mg | ORAL_TABLET | ORAL | Status: DC
  Administered 2018-05-17 – 2018-05-19 (×2): 12.5 mg via ORAL
  Filled 2018-05-15 (×2): qty 1

## 2018-05-15 MED ORDER — TRIMETHOPRIM 100 MG PO TABS
50.0000 mg | ORAL_TABLET | Freq: Every day | ORAL | Status: DC
  Administered 2018-05-16 – 2018-05-19 (×3): 50 mg via ORAL
  Filled 2018-05-15 (×4): qty 1

## 2018-05-15 MED ORDER — NEPRO/CARBSTEADY PO LIQD
237.0000 mL | Freq: Three times a day (TID) | ORAL | Status: DC | PRN
  Filled 2018-05-15: qty 237

## 2018-05-15 MED ORDER — SEVELAMER CARBONATE 800 MG PO TABS
1600.0000 mg | ORAL_TABLET | Freq: Three times a day (TID) | ORAL | Status: DC
  Administered 2018-05-16 – 2018-05-19 (×4): 1600 mg via ORAL
  Filled 2018-05-15 (×3): qty 2

## 2018-05-15 MED ORDER — HYDROXYZINE HCL 25 MG PO TABS
25.0000 mg | ORAL_TABLET | Freq: Three times a day (TID) | ORAL | Status: DC | PRN
  Filled 2018-05-15: qty 1

## 2018-05-15 MED ORDER — ONDANSETRON HCL 4 MG/2ML IJ SOLN: 4.0000 mg | Freq: Four times a day (QID) | INTRAMUSCULAR | Status: DC | PRN

## 2018-05-15 MED ORDER — ALPRAZOLAM 0.5 MG PO TABS
1.0000 mg | ORAL_TABLET | Freq: Every day | ORAL | Status: DC
  Administered 2018-05-15 – 2018-05-18 (×4): 1 mg via ORAL
  Filled 2018-05-15 (×4): qty 2

## 2018-05-15 MED ORDER — ZOLPIDEM TARTRATE 5 MG PO TABS: 5.0000 mg | ORAL_TABLET | Freq: Every evening | ORAL | Status: DC | PRN

## 2018-05-15 MED ORDER — SODIUM CHLORIDE 0.9% FLUSH
3.0000 mL | Freq: Two times a day (BID) | INTRAVENOUS | Status: DC
  Administered 2018-05-15 – 2018-05-19 (×5): 3 mL via INTRAVENOUS

## 2018-05-15 MED ORDER — SORBITOL 70 % SOLN: 30.0000 mL | Status: DC | PRN

## 2018-05-15 MED ORDER — LUBIPROSTONE 24 MCG PO CAPS
24.0000 ug | ORAL_CAPSULE | ORAL | Status: DC
  Administered 2018-05-17: 24 ug via ORAL
  Filled 2018-05-15 (×2): qty 1

## 2018-05-15 MED ORDER — CAMPHOR-MENTHOL 0.5-0.5 % EX LOTN
1.0000 "application " | TOPICAL_LOTION | Freq: Three times a day (TID) | CUTANEOUS | Status: DC | PRN
  Filled 2018-05-15: qty 222

## 2018-05-15 MED ORDER — ACETAMINOPHEN 650 MG RE SUPP: 650.0000 mg | Freq: Four times a day (QID) | RECTAL | Status: DC | PRN

## 2018-05-15 MED ORDER — SODIUM CHLORIDE 0.9% IV SOLUTION
Freq: Once | INTRAVENOUS | Status: AC
  Administered 2018-05-15: 17:00:00 via INTRAVENOUS

## 2018-05-15 MED ORDER — CALCITRIOL 0.5 MCG PO CAPS
0.5000 ug | ORAL_CAPSULE | Freq: Every day | ORAL | Status: DC
  Administered 2018-05-16: 0.5 ug via ORAL
  Filled 2018-05-15: qty 1

## 2018-05-15 NOTE — ED Notes (Signed)
[facility] staff nurses notified verbally by this RN at nurses station of pt arrival and pt in bed resting comfortably

## 2018-05-15 NOTE — ED Notes (Signed)
Obtained consent for blood transfusion

## 2018-05-15 NOTE — H&P (Signed)
History and Physical    [name] OMV:[id number] DOB: 1941-02-25 DOA: 05/15/2018  PCP: [name], MD Consultants:  [name] - cardiology; Lorrene Reid - nephrology; Shadad - heme Patient coming from:  Home - lives with husband; NOK: Husband, [phone]  Chief Complaint: Chest pain  HPI: [name] is a 77 y.o. female with medical history significant of CAD s/p stents; HTN; HLD; diastolic CHF; GI bleed requiring 6 units of blood; DM; ESRD on MWF HD presenting with chest pain.  She reports that she had a "slamming heart and extreme weakness."  This has been happening off and on for weeks.  She started HD about 1 month ago and seemed to have a little more energy at first.  She thinks her current symptoms are related to not receiving her IV iron infusion since starting HD.  She called cardiology last week and they worked her in today; she went to the appointment and was sent to the ER.  She reports feeling weak and tired; SOB with exertion.  No blood in her stools.  She was hospitalized in 3/18 for GI bleeding requiring 6 units of blood transfusion.  She had a negative EGD but colonoscopy showed a few small ulcers/erosions and diverticulosis with "multiple blood laden stool balls".  She had an unremarkable capsule endoscopy in 7/18.   ED Course: CP and DOE, progressive.  No symptoms at rest.  +LBBB so called STEMI in cardiology clinic.  Has symptomatic anemia - Hgb 7.3, Heme positive.  Troponin 0.96, awaiting repeat.  Cardiology does not think this is STEMI, not a good candidate for heparin.  Transfusing 2 units PRBC.  She is hemodynamically stable, has not called GI.  No frank bleeding.  Review of Systems: As per HPI; otherwise review of systems reviewed and negative.   Ambulatory Status:  Ambulates with a walker  Past Medical History:  Diagnosis Date  . Anemia    Pt is taking iron.   [name] Anxiety   . Arthritis   . Carotid stenosis    40-59% bilateral ICA stenosis in 2/12.  . Chronic  low back pain   . CKD (chronic kidney disease)    Dr. Audie Clear at [facility]  . Coronary artery disease    Pt presented 2/10 to [facility] with NSTEMI and diastolic CHF exacerbation.  LHC was done  3/10 showing 99% pRCA stenosis and 80% calcified pLAD stenosis with L=>R collaterals.  Pt was referred  for CABG which was done by Dr. Prescott Gum with LIMA-LAD, SVG-RCA, SVG-OM.  . Diabetes mellitus   . Diabetic neuropathy ([place])   . Diastolic CHF (HCC)    Echo (2/10) showed EF 55-65%, mild LVH, diastolic dysfunction, mild AS with mean gradient 12 mmHg, PASP 43 mmHg.  Echo (2/12): EF 55-60%, mild LVH, mild AS (mean gradient 12), PA systolic pressure 32 mmHg.     [name] GERD (gastroesophageal reflux disease)   . Heart murmur   . Hyperlipidemia   . Hypertension   . Mild aortic stenosis    mean gradient 12 mmHg in 2/12.  . Myocardial infarction ([place])    "mild"  . Pneumonia   . PONV (postoperative nausea and vomiting)   . Thrombocytopenia ([place])   . Unsteady gait     Past Surgical History:  Procedure Laterality Date  . AV FISTULA PLACEMENT Left 01/30/2015   Procedure: Creation of a Radial Cephalic AV Fistula left wrist;  Surgeon: [name], MD;  Location: [place];  Service: Vascular;  Laterality: Left;  . BACK SURGERY     multiple  . BREAST SURGERY     biopsy  . CARDIAC CATHETERIZATION    . CATARACT EXTRACTION W/ INTRAOCULAR LENS  IMPLANT, BILATERAL    . COLONOSCOPY Left 11/03/2016   Procedure: COLONOSCOPY;  Surgeon: [name], MD;  Location: [facility] ENDOSCOPY;  Service: Endoscopy;  Laterality: Left;  . COLONOSCOPY W/ BIOPSIES AND POLYPECTOMY    . CORONARY ARTERY BYPASS GRAFT  09/2008   pt with NSTEMI and diastolic CHF exacerbation.  LHC was done  3/10 showing 99% pRCA stenosis and 80% calcified pLAD stenosis with L=>R collaterals.  Pt was referred  for CABG which was done by Dr. Prescott Gum with LIMA-LAD, SVG-RCA, SVG-OM.  [name] ESOPHAGOGASTRODUODENOSCOPY N/A 11/02/2016   Procedure:  ESOPHAGOGASTRODUODENOSCOPY (EGD);  Surgeon: [name], MD;  Location: [facility] ENDOSCOPY;  Service: Endoscopy;  Laterality: N/A;  . GIVENS CAPSULE STUDY N/A 11/29/2016   Procedure: GIVENS CAPSULE STUDY;  Surgeon: [name], MD;  Location: [place];  Service: Endoscopy;  Laterality: N/A;  . REVISON OF ARTERIOVENOUS FISTULA Left 07/02/2015   Procedure: REVISON OF LEFT RADIOCEPHALIC ARTERIOVENOUS FISTULA;  Surgeon: [name], MD;  Location: [place];  Service: Vascular;  Laterality: Left;    Social History   Socioeconomic History  . Marital status: Married    Spouse name: Not on file  . Number of children: Not on file  . Years of education: Not on file  . Highest education level: Not on file  Occupational History  . Not on file  Social Needs  . Financial resource strain: Not on file  . Food insecurity:    Worry: Not on file    Inability: Not on file  . Transportation needs:    Medical: Not on file    Non-medical: Not on file  Tobacco Use  . Smoking status: Former Smoker    Types: Cigarettes  . Smokeless tobacco: Never Used  . Tobacco comment: quit 1988  Substance and Sexual Activity  . Alcohol use: Yes    Comment: beer daily  . Drug use: No  . Sexual activity: Never    Partners: Male  Lifestyle  . Physical activity:    Days per week: Not on file    Minutes per session: Not on file  . Stress: Not on file  Relationships  . Social connections:    Talks on phone: Not on file    Gets together: Not on file    Attends religious service: Not on file    Active member of club or organization: Not on file    Attends meetings of clubs or organizations: Not on file    Relationship status: Not on file  . Intimate partner violence:    Fear of current or ex partner: Not on file    Emotionally abused: Not on file    Physically abused: Not on file    Forced sexual activity: Not on file  Other Topics Concern  . Not on file  Social History Narrative  . Not on file    Allergies   Allergen Reactions  . Doxycycline Nausea And Vomiting    Caused "DEATHLY NAUSEA AND VOMITING"  . Lipitor [Atorvastatin] Other (See Comments)    Stomach pain  . Strawberry Extract Rash    Family History  Adopted: Yes  Family history unknown: Yes    Prior to Admission medications   Medication Sig Start Date End Date Taking? Authorizing Provider  ALPRAZolam Duanne Moron) 1 MG tablet Take  1 mg by mouth at bedtime.  11/18/17   [provider]  AMITIZA 24 MCG capsule Take 24 mcg by mouth 2 (two) times daily with a meal.  06/05/17   [provider]  amLODipine (NORVASC) 5 MG tablet Take 1 tablet (5 mg total) by mouth daily. 01/20/15   [name], NP  calcitRIOL (ROCALTROL) 0.5 MCG capsule Take 0.5 mcg by mouth daily. 04/09/18   [provider]  carvedilol (COREG) 12.5 MG tablet TAKE 1 TABLET(12.5 MG) BY MOUTH TWICE DAILY Patient taking differently: Take 12.5 mg by mouth 2 (two) times daily with a meal.  03/14/16   [name], NP  folic acid (FOLVITE) 1 MG tablet Take 1 tablet (1 mg total) by mouth daily. 04/24/18   [name], MD  losartan (COZAAR) 25 MG tablet Take 1 tablet (25 mg total) by mouth daily. 04/22/14   Larey Dresser, MD  rosuvastatin (CRESTOR) 5 MG tablet TAKE 1 TABLET(5 MG) BY MOUTH DAILY Patient taking differently: Take 5 mg by mouth at bedtime.  06/27/17   [name], NP  sevelamer carbonate (RENVELA) 800 MG tablet Take 1,600 mg 3 (three) times daily with meals by mouth.  12/06/16   [provider]  trimethoprim (TRIMPEX) 100 MG tablet Take 50 mg by mouth daily.  11/07/14   [provider]  vitamin B-12 (CYANOCOBALAMIN) 500 MCG tablet Take 1 tablet (500 mcg total) by mouth daily. 04/23/18   [name], MD  metoprolol succinate (TOPROL-XL) 50 MG 24 hr tablet Take 50 mg by mouth 2 (two) times daily. Take with or immediately following a meal.  05/15/18  [provider]    Physical Exam: Vitals:   05/15/18 1650  05/15/18 1709 05/15/18 1715 05/15/18 1800  BP: 102/62 121/66 105/61 103/65  Pulse: 92 94 92 90  Resp: (!) 23 (!) 22 19 (!) 23  Temp: 98.1 F (36.7 C) 98.3 F (36.8 C)    TempSrc: Oral Oral    SpO2: 98% 98% 96% 97%  Weight:      Height:         General:  Appears calm and comfortable and is NAD; she is quite pale Eyes:  PERRL, EOMI, normal lids, iris ENT:  grossly normal hearing, lips & tongue, mmm Neck:  no LAD, masses or thyromegaly Cardiovascular:  RRR, no m/r/g. No LE edema.  Respiratory:   CTA bilaterally with no wheezes/rales/rhonchi.  Normal respiratory effort. Abdomen:  soft, NT, ND, NABS Back:   normal alignment, no CVAT Skin:  no rash or induration seen on limited exam Musculoskeletal:  grossly normal tone BUE/BLE, good ROM, no bony abnormality Lower extremity:  No LE edema.  Limited foot exam with no ulcerations.  2+ distal pulses. Psychiatric:  grossly normal mood and affect, speech fluent and appropriate, AOx3 Neurologic:  CN 2-12 grossly intact, moves all extremities in coordinated fashion, sensation intact    Radiological Exams on Admission: Dg Chest Port 1 View  Result Date: 05/15/2018 CLINICAL DATA:  Chest pain. EXAM: PORTABLE CHEST 1 VIEW COMPARISON:  Radiographs of September 26, 2016. FINDINGS: Stable cardiomegaly. Status post coronary bypass graft. Atherosclerosis of thoracic aorta. Right internal jugular dialysis catheter is noted with tip in expected position of cavoatrial junction. No pneumothorax or pleural effusion is noted. Both lungs are clear. The visualized skeletal structures are unremarkable. IMPRESSION: No acute cardiopulmonary abnormality seen. Interval placement of right internal jugular dialysis catheter with tip in expected position of cavoatrial junction. Aortic  Atherosclerosis (ICD10-I70.0). Electronically Signed   By: Marijo Conception, M.D.   On: 05/15/2018 13:37    EKG: Independently reviewed.   1149 - Sinus tachycardia with rate 121;  nonspecific ST changes with IVCD 1149 - Sinus tachycardia with rate 97; nonspecific ST changes with IVCD and LVH   Labs on Admission: I have personally reviewed the available labs and imaging studies at the time of the admission.  Pertinent labs:   Glucose 229 BUN 22/Creatinine 3.29/GFR 15 - improved Troponin 0.96 Hgb 7.3; 9.7 on 9/8 MCV 116.2 Plt 132 - stable   Assessment/Plan Principal Problem:   Symptomatic anemia Active Problems:   Diabetes mellitus type II, non insulin dependent (HCC)   Essential hypertension   ESRD (end stage renal disease) on dialysis [facility])   Demand ischemia (HCC)   Symptomatic anemia -Patient with prior admission in 3/18 for GI bleed requiring 6 units PRBC -She has known iron deficiency anemia for which she was previously receiving iron infusions -Hgb 7.3; 9.7 on 9/8 -MCV 116.2, RDW is normal at 14.7 - this does not appear to be iron deficiency anemia -Instead, most likely related to CKD and/or acute blood loss -Heme testing is positive -Given positive troponin (see below), she is not a current candidate for GI intervention so will not call tonight; clear liquids for now. -Will observe for now in  Tele bed. -Transfuse 2 units PRBC to start and recheck Hgb afterwards.  -Patient counseled about short- and long-term risks associated with transfusion and consents to receive blood products.  Demand ischemia -Troponin 0.96 -> 1.35 -This is in the setting of ESRD and so may not be fully accurate -She does have symptomatic anemia and so this is more likely related to anemia than ACS -Cardiology was involved with this patient upon arrival because a CODE STEMI was called -If troponin continues to rise, cardiology consult overnight may be needed; however, for now will transfuse and continue to trend troponin. -Echo recommended by NP [name] and will be ordered.  ESRD -Patient on chronic MWF HD -Nephrology prn order set utilized -She does not appear to be  volume overloaded or otherwise in need of acute HD -Nephrology will need to be notified of patient and need for HD tomorrow -Continue Nephro-vite, Renvela, and Rocaltrol  HTN -Continue Norvasc; Coreg -Hold Cozaar based on current borderline low BP  DM -Diet controlled  -No recent A1c, will order -For now, no SSI but may need to reconsider based on A1c   DVT prophylaxis: SCDs Code Status:  Full - confirmed with patient Family Communication: None present Disposition Plan:  Home once clinically improved Consults called: Cardiology - may need repeat consult if troponin continues to uptrend; will need nephrology and possibly also GI  Admission status: It is my clinical opinion that referral for OBSERVATION is reasonable and necessary in this patient based on the above information provided. The aforementioned taken together are felt to place the patient at high risk for further clinical deterioration. However it is anticipated that the patient may be medically stable for discharge from the hospital within 24 to 48 hours.    Karmen Bongo MD [facility]  If note is complete, please contact covering daytime or nighttime physician. [email or web address] Password [email or web address]  05/15/2018, 6:36 PM

## 2018-05-15 NOTE — Progress Notes (Signed)
CARDIOLOGY OFFICE NOTE  Date:  05/15/2018    [name] Date of Birth: 01/23/1941 Medical Record #[id number]  PCP:  Jeanne Sacramento, MD  Cardiologist:  [name]    Chief Complaint  Patient presents with  . Coronary Artery Disease    Work in visit - former patient of Dr. Claris [name]    History of Present Illness: [name] is a 77 y.o. female who presents today for a work in visit. She is a former patient of Dr. Claris [name].   She has a h/o CAD s/p CABG in 9735 and diastolic CHF. She has chronic anemia, balance issues, HTN, DM, HLD, CKD, chronic back pain, anxiety and known valvular heart disease.   I have seen her several times over the last few years. She is followed by Nephrology in [place] - has seen Dr. Kellie [name] for AV fistula consult. Kidneys have been failing and she has beennearing dialysis for some time.   Seen back in Julyof 2017 - feeling ok. EKG with more changes. Updated her Myoview - see below. Brought back for discussion - opted to avoid cardiac catheterization and continue with medical management.She has had no desire to go on dialysis.  Seen in March of 2018 - she hadbeen in the hospital a few times since I had last seen her.Hadhad a mild stroke the month prior- noted to have right thalamic small infarct due to small vessel disease - carotids with 40 to 59% stenosis, Echo with normal EF, had her dose of aspirin increased to 325 mg. Then she had some major spontaneously resolving GI bleeding and had to havecolonoscopy. Small erosions noted. Negative EGD. She required transfusion with total of 6 units of blood.Aspirinhelduntil seen back by GI. Shewas feeling better clinically. No chest pain noted. Overall prognosis felt to be quite tenuous.  I have seen here a few times since then - she has remained off aspirin. She has been able to "hold her own" despite her co-morbidities. Last visit was in May - she had fallen and broken her kneecap.   Admitted in  September - to start dialysis. Her creatinine was almost 5. Now on M-W-F schedule.   She called here last week - "S/w pt started emergent dialysis and was put in hospital one month ago.  On Monday at Dialysis pt was oscillated and stated heard fluid in the left lung.  Pt stated about one week ago SOB, fatigued, chest and heart pounding and can hear in pt's ears. Also reported lightheaded and dizzy.  Pt denies nausea, pre-syncope, and chest pain.  Pt does not want to go back in the hospital and wanted to be seen. Will call pt back on cell phone with [name]'s recommendations, pt will be at dialysis today. Will send to Cecille Rubin to advise." Thus added to my schedule for today.   Comes in today. Here withher husband.She comes with a packed bag. She feels quite bad. Says she "needs to go" to the hospital. She has had chest pressure for over a week. She is more short of breath. She feels dizzy and feels like her heart is beating fast. She brought a pamphlet about TAVR.    Past Medical History:  1. CAD: Pt presented 2/10 [facility] with NSTEMI and diastolic CHF exacerbation. LHC was done 3/10 showing 99% pRCA stenosis and 80% calcified pLAD stenosis with L=>R collaterals. Pt was referred for CABG which was done by Dr. Prescott [name] with LIMA-LAD, SVG-RCA, SVG-OM.  2. Diastolic  CHF: Echo (2/10) showed EF 55-65%, mild LVH, diastolic dysfunction, mild AS with mean gradient 12 mmHg, PASP 43 mmHg. Echo (2/12): EF 55-60%, mild LVH, mild AS (mean gradient 12), PA systolic pressure 32 mmHg. Echo (5/15) with EF 55%, mild MR, mild aortic stenosis.  3. CKD 4. DM2  5. HTN  6. Hyperlipidemia: nausea/GI discomfort with atorvastatin, ? Balance problems with pravastatin.  7. Anxiety  8. Chronic low back pain  9. History of thrombocytopenia  10. 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 Followed by hematology, getting Aranesp injections 11. Aortic stenosis: mean gradient 12 mmHg in 2/12. Mean gradient 18  mmHg with AVA 1.6 cm^2 in 5/15.  12. Carotid stenosis: 40-59% bilateral ICA stenosis in 4/49. 67-59% LICA stenosis in 1/63. Carotids (8/46) with 65-99% LICA stenosis.  13. PAD: ABIs 3/12 were normal but TBIs were mildly decreased. ABIs/TBIs (10/13) normal.   Past Medical History:  Diagnosis Date  . Anemia    Pt is taking iron.   [name] Anxiety   . Arthritis   . Carotid stenosis    40-59% bilateral ICA stenosis in 2/12.  . Chronic low back pain   . CKD (chronic kidney disease)    Dr. Audie [name] at [facility]  . Coronary artery disease    Pt presented 2/10 to [facility] with NSTEMI and diastolic CHF exacerbation.  LHC was done  3/10 showing 99% pRCA stenosis and 80% calcified pLAD stenosis with L=>R collaterals.  Pt was referred  for CABG which was done by Dr. Prescott [name] with LIMA-LAD, SVG-RCA, SVG-OM.  . Diabetes mellitus   . Diabetic neuropathy ([place])   . Diastolic CHF ([facility])    Echo (2/10) showed EF 55-65%, mild LVH, diastolic dysfunction, mild AS with mean gradient 12 mmHg, PASP 43 mmHg.  Echo (2/12): EF 55-60%, mild LVH, mild AS (mean gradient 12), PA systolic pressure 32 mmHg.     [name] GERD (gastroesophageal reflux disease)   . Heart murmur   . Hyperlipidemia   . Hypertension   . Mild aortic stenosis    mean gradient 12 mmHg in 2/12.  . Myocardial infarction ([place])    "mild"  . Pneumonia   . PONV (postoperative nausea and vomiting)   . Thrombocytopenia ([place])   . Unsteady gait     Past Surgical History:  Procedure Laterality Date  . AV FISTULA PLACEMENT Left 01/30/2015   Procedure: Creation of a Radial Cephalic AV Fistula left wrist;  Surgeon: Jeanne Misty, MD;  Location: [facility];  Service: Vascular;  Laterality: Left;  . BACK SURGERY     multiple  . BREAST SURGERY     biopsy  . CARDIAC CATHETERIZATION    . CATARACT EXTRACTION W/ INTRAOCULAR LENS  IMPLANT, BILATERAL    . COLONOSCOPY Left 11/03/2016   Procedure: COLONOSCOPY;  Surgeon: Jeanne Ada, MD;  Location[facility]  ENDOSCOPY;  Service: Endoscopy;  Laterality: Left;  . COLONOSCOPY W/ BIOPSIES AND POLYPECTOMY    . CORONARY ARTERY BYPASS GRAFT  09/2008   pt with NSTEMI and diastolic CHF exacerbation.  LHC was done  3/10 showing 99% pRCA stenosis and 80% calcified pLAD stenosis with L=>R collaterals.  Pt was referred  for CABG which was done by Dr. Prescott [name] with LIMA-LAD, SVG-RCA, SVG-OM.  [name] ESOPHAGOGASTRODUODENOSCOPY N/A 11/02/2016   Procedure: ESOPHAGOGASTRODUODENOSCOPY (EGD);  Surgeon: [name], MD;  Location: [facility] ENDOSCOPY;  Service: Endoscopy;  Laterality: N/A;  . GIVENS CAPSULE STUDY N/A 11/29/2016   Procedure: GIVENS CAPSULE STUDY;  Surgeon:  [name], MD;  Location: [place];  Service: Endoscopy;  Laterality: N/A;  . REVISON OF ARTERIOVENOUS FISTULA Left 07/02/2015   Procedure: REVISON OF LEFT RADIOCEPHALIC ARTERIOVENOUS FISTULA;  Surgeon: Jeanne Misty, MD;  Location: [facility] OR;  Service: Vascular;  Laterality: Left;     Medications: Current Meds  Medication Sig  . ALPRAZolam (XANAX) 1 MG tablet Take 1 mg by mouth at bedtime.   . AMITIZA 24 MCG capsule Take 24 mcg by mouth 2 (two) times daily with a meal.   . amLODipine (NORVASC) 5 MG tablet Take 1 tablet (5 mg total) by mouth daily.  . calcitRIOL (ROCALTROL) 0.5 MCG capsule Take 0.5 mcg by mouth daily.  . carvedilol (COREG) 12.5 MG tablet TAKE 1 TABLET(12.5 MG) BY MOUTH TWICE DAILY (Patient taking differently: Take 12.5 mg by mouth 2 (two) times daily with a meal. )  . folic acid (FOLVITE) 1 MG tablet Take 1 tablet (1 mg total) by mouth daily.  [name] losartan (COZAAR) 25 MG tablet Take 1 tablet (25 mg total) by mouth daily.  . rosuvastatin (CRESTOR) 5 MG tablet TAKE 1 TABLET(5 MG) BY MOUTH DAILY (Patient taking differently: Take 5 mg by mouth at bedtime. )  . sevelamer carbonate (RENVELA) 800 MG tablet Take 1,600 mg 3 (three) times daily with meals by mouth.   . trimethoprim (TRIMPEX) 100 MG tablet Take 50 mg by mouth daily.   . vitamin B-12  (CYANOCOBALAMIN) 500 MCG tablet Take 1 tablet (500 mcg total) by mouth daily.     Allergies: Allergies  Allergen Reactions  . Lipitor [Atorvastatin] Other (See Comments)    Stomach pain  . Strawberry Extract Rash    Social History: The patient  reports that she has quit smoking. Her smoking use included cigarettes. She has never used smokeless tobacco. She reports that she drinks alcohol. She reports that she does not use drugs.   Family History: The patient's She was adopted. Family history is unknown by patient.   Review of Systems: Please see the history of present illness.   Otherwise, the review of systems is positive for none.   All other systems are reviewed and negative.   Physical Exam: VS:  BP 100/60 (BP Location: Left Arm, Patient Position: Sitting, Cuff Size: Normal)   Pulse (!) 121   Ht 5\' 8"  (1.727 m)   Wt 117 lb 12.8 oz (53.4 kg)   BMI 17.91 kg/m  .  BMI Body mass index is 17.91 kg/m.  Wt Readings from Last 3 Encounters:  05/15/18 117 lb 12.8 oz (53.4 kg)  04/23/18 109 lb 2 oz (49.5 kg)  02/28/18 118 lb 4.8 oz (53.7 kg)    General: Alert. She looks severely pale. Chronically ill appearing.  She is thin/malnourished.   HEENT: Normal.  Neck: Supple, no JVD, carotid bruits, or masses noted.  Cardiac: Her rate is fast. She has an outflow murmur. No edema.  Respiratory:  Lungs are [name] to auscultation bilaterally with normal work of breathing.  GI: Soft and nontender.  MS: No deformity or atrophy. Gait not tested. She is in a wheelchair.  Skin: Warm and dry. Color is very pale.  Neuro:  Strength and sensation are intact and no gross focal deficits noted.  Psych: Alert, appropriate and with normal affect. Look tired.    LABORATORY DATA:  EKG:  EKG is ordered today. This demonstrates tachycardia - LBBB with diffuse ST/T wave changes. Reviewed with both Dr. Tamala Julian and Dr. Radford Pax in the office. Unclear  if STEMI but with possible ST elevation in AVL along with  ischemic changes.   Lab Results  Component Value Date   WBC 5.7 04/22/2018   HGB 9.7 (L) 04/22/2018   HCT 29.8 (L) 04/22/2018   PLT 83 (L) 04/22/2018   GLUCOSE 109 (H) 04/23/2018   CHOL 81 (L) 12/19/2017   TRIG 68 12/19/2017   HDL 56 12/19/2017   LDLDIRECT 34.4 11/17/2008   LDLCALC 11 12/19/2017   ALT 12 12/19/2017   AST 18 12/19/2017   NA 133 (L) 04/23/2018   K 4.1 04/23/2018   CL 98 04/23/2018   CREATININE 5.39 (H) 04/23/2018   BUN 84 (H) 04/23/2018   CO2 21 (L) 04/23/2018   TSH 2.185 01/17/2014   INR 1.07 04/18/2018   HGBA1C 5.4 09/27/2016     BNP (last 3 results) No results for input(s): BNP in the last 8760 hours.  ProBNP (last 3 results) No results for input(s): PROBNP in the last 8760 hours.   Other Studies Reviewed Today:  Echo Study Conclusions 09/2016  - Left ventricle: The cavity size was normal. There was moderate   focal basal and mild concentric hypertrophy. Systolic function   was normal. The estimated ejection fraction was in the range of   55% to 60%. Wall motion was normal; there were no regional wall   motion abnormalities. Features are consistent with a pseudonormal   left ventricular filling pattern, with concomitant abnormal   relaxation and increased filling pressure (grade 2 diastolic   dysfunction). - Aortic valve: The AV appears mildly to moderately stenotic by   doppler with a mean AV gradient of 35mmHg. The calculated AVA is   0.9cm2 and likely underestimates with AVA due to inaccurate   measurment of the LVOT. Visually there appears to be mild to   moderate AS. Valve area (VTI): 0.9 cm^2. Valve area (Vmax): 0.89   cm^2. Valve area (Vmean): 0.86 cm^2. - Mitral valve: There was trivial regurgitation. - Right ventricle: Systolic function was mildly reduced. - Pulmonic valve: There was mild regurgitation. - Pulmonary arteries: PA peak pressure: 33 mm Hg (S).   Myoview Study Highlights 02/2016    Nuclear stress EF: 54%. There is  mid inferior wall hypokinesis  There was no ST segment deviation noted during stress.  Defect 1: There is a medium defect of moderate severity present in the basal inferior and mid inferior location.  The mid inferior wall perfusion defect is reversible consistent with ischemia. The basal inferior wall defect is mostly fixed consistent with infarct.  This is an intermediate risk study. Consider occlusion of SVG to RCA graft.  Candee Furbish, MD     Carotid Doppler Summary 09/2016: Findings consistent with a high end 1- 39 percent stenosis involving the right internal carotid artery and the left internal carotid artery. Can not rule out higher grade stenosis due to severe amount of shadowing plaque in bilateral carotid arteries and difficulty visualizing vessels. Severe amount of plaque are subjectively suggestive of a 40-59% percent stenosis bilaterally. The right vertebral demonstrates atypical flow, left vertebral is patent and antegrade.  Other specific details can be found in the table(s) above. Prepared and Electronically Authenticated by  Antony Contras MD 2018-02-14T09:22:41   Assessment/Plan:   1. Chest pain - has known CAD with remote CABG - prior intermediate Myoview - she has previously opted for medical management. She is however now on dialysis. She has not been on anticoagulation due to prior GI bleed.  She had EGD  3/21 by Dr. Collene Mares which was negative. Subsequent colonoscopy w/diverticulosis and afew small ulcers/erosions in the sigmoid and ascending colon. She remainsoff of aspirin.She has not wished to restart. No active bleeding noted at this time but her color is quite worrisome.Her EKG is abnormal. Has LBBB but concerning changes in AVL. This has been reviewed with both Dr.Smith and Turner here in the office today. She is transported to the [facility] ER by EMS. [facility] alerted.   2. Aortic stenosis - worrisome with current presentation - will need echo.  Unclear what she will be a candidate for if needed. She is asking about TAVR.   3. ESRD - now on dialysis  4. Tachycardia - ?flutter versus sinus. Has LBBB.  5. Prior GI bleed - she does not take any anticoagulation  6.  Prior stroke - she was treated with higher dose aspirin - this led to GI bleed - she is chronically anemia.   7. HTN - BP soft.   8. HLD - not discussed  9. Known Carotid disease   Current medicines are reviewed with the patient today.  The patient does not have concerns regarding medicines other than what has been noted above.  The following changes have been made:  See above.  Labs/ tests ordered today include:    Orders Placed This Encounter  Procedures  . EKG 12-Lead     Disposition:   EMS has been called. ER alerted at [facility]. Further disposition to follow. Her overall situation is quite tenuous at best.    Patient is agreeable to this plan and will call if any problems develop in the interim.   SignedTruitt Merle, NP  05/15/2018 11:54 AM  [facility] [address] [place], [place]  [address] Phone: [phone] Fax: [phone]

## 2018-05-15 NOTE — ED Notes (Signed)
Renal meal tray ordered for patient.  

## 2018-05-15 NOTE — ED Notes (Signed)
53[facility] staff notified of pt arrival via call bell. Advised staff would be to room.

## 2018-05-15 NOTE — Patient Instructions (Addendum)
We are sending you to the hospital today.   Call the [place] office at [phone] if you have any questions, problems or concerns.

## 2018-05-15 NOTE — ED Triage Notes (Signed)
Pt has been having chest pressure while moving, SOB, generalized weakness for a week. Pt went to her cardiologist today and was sent here for further eval.  Pt just recently started dialysis a month ago. BP 118/74, HR 90-120. Pt not complaining of any CP for EMS.

## 2018-05-15 NOTE — Progress Notes (Signed)
Pt. arrived to the floor by stretcher. A/O x 4. Placed on telemetry Box 16. Oriented to room and unit.

## 2018-05-15 NOTE — ED Provider Notes (Addendum)
[place] EMERGENCY DEPARTMENT Provider Note   CSN: [id number] Arrival date & time: 05/15/18  1223   History   Chief Complaint Chief Complaint  Patient presents with  . Chest Pain    HPI [name] is a 77 y.o. female.  HPI   77 year old female PMH significant for CABG, HFpEF, chronic anemia, T2DM, CKD stage IV, HD on MWF, history of GI bleed requiring 6 units of blood, presents with exertional chest pain.  Patient states over the last several weeks she has had progressively worsening chest pain on exertion and weakness.  Patient states that she is able to ambulate a predicted distance where she develops acute substernal pressure-like chest pain, nonradiating, resting makes it better, activity makes it worse.  Patient denies current chest pain at rest.  Patient was seen as outpatient cardial to clinic he was concerned but story and transfer patient to [facility].  EKG prior to arrival showed left bundle block which was activated as a STEMI.  Past Medical History:  Diagnosis Date  . Anemia    Pt is taking iron.   [name] Anxiety   . Arthritis   . Carotid stenosis    40-59% bilateral ICA stenosis in 2/12.  . Chronic low back pain   . CKD (chronic kidney disease)    Dr. Audie Clear at [facility]  . Coronary artery disease    Pt presented 2/10 to [facility] with NSTEMI and diastolic CHF exacerbation.  LHC was done  3/10 showing 99% pRCA stenosis and 80% calcified pLAD stenosis with L=>R collaterals.  Pt was referred  for CABG which was done by Dr. Prescott Gum with LIMA-LAD, SVG-RCA, SVG-OM.  . Diabetes mellitus   . Diabetic neuropathy ([place])   . Diastolic CHF (HCC)    Echo (2/10) showed EF 55-65%, mild LVH, diastolic dysfunction, mild AS with mean gradient 12 mmHg, PASP 43 mmHg.  Echo (2/12): EF 55-60%, mild LVH, mild AS (mean gradient 12), PA systolic pressure 32 mmHg.     [name] GERD (gastroesophageal reflux disease)   . Heart murmur   . Hyperlipidemia   .  Hypertension   . Mild aortic stenosis    mean gradient 12 mmHg in 2/12.  . Myocardial infarction ([place])    "mild"  . Pneumonia   . PONV (postoperative nausea and vomiting)   . Thrombocytopenia ([place])   . Unsteady gait     Patient Active Problem List   Diagnosis Date Noted  . Protein-calorie malnutrition, severe 04/20/2018  . Pressure injury of skin 04/18/2018  . GI bleed 10/31/2016  . Blood loss anemia 10/31/2016  . AKI (acute kidney injury) ([place]) 10/31/2016  . CVA (cerebral vascular accident) ([place]) 09/26/2016  . 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 Chronic kidney disease (CKD), stage IV (severe) ([place]) 06/23/2015  . Balance problem 12/22/2013  . Edema 12/28/2011  . CLAUDICATION 11/01/2010  . CAROTID ARTERY STENOSIS 10/29/2009  . AORTIC STENOSIS 09/17/2009  . Essential hypertension 11/17/2008  . DIASTOLIC HEART FAILURE, CHRONIC 11/17/2008  . Diabetes mellitus type II, non insulin dependent ([place]) 11/12/2008  . Other and unspecified hyperlipidemia 11/12/2008  . Anemia, chronic renal failure 11/12/2008  . Thrombocytopenia ([place]) 11/12/2008  . Anxiety state 11/12/2008  . Coronary atherosclerosis 11/12/2008  . UNSPEC COMBINED SYSTOLIC&DIASTOLIC HEART FAILURE 29/93/7169  . ESOPHAGEAL REFLUX 11/12/2008  . Chronic kidney disease (CKD), stage V ([place]) 11/12/2008  . LOW BACK PAIN, CHRONIC 11/12/2008  . INSOMNIA UNSPECIFIED 11/12/2008    Past Surgical History:  Procedure Laterality Date  . AV FISTULA PLACEMENT Left 01/30/2015   Procedure: Creation of a Radial Cephalic AV Fistula left wrist;  Surgeon: [name], MD;  Location: [place];  Service: Vascular;  Laterality: Left;  . BACK SURGERY     multiple  . BREAST SURGERY     biopsy  . CARDIAC CATHETERIZATION    . CATARACT EXTRACTION W/ INTRAOCULAR LENS  IMPLANT, BILATERAL    . COLONOSCOPY Left 11/03/2016   Procedure: COLONOSCOPY;  Surgeon: [name], MD;  Location: [facility] ENDOSCOPY;  Service: Endoscopy;  Laterality: Left;  .  COLONOSCOPY W/ BIOPSIES AND POLYPECTOMY    . CORONARY ARTERY BYPASS GRAFT  09/2008   pt with NSTEMI and diastolic CHF exacerbation.  LHC was done  3/10 showing 99% pRCA stenosis and 80% calcified pLAD stenosis with L=>R collaterals.  Pt was referred  for CABG which was done by Dr. Prescott Gum with LIMA-LAD, SVG-RCA, SVG-OM.  [name] ESOPHAGOGASTRODUODENOSCOPY N/A 11/02/2016   Procedure: ESOPHAGOGASTRODUODENOSCOPY (EGD);  Surgeon: [name], MD;  Location: [facility] ENDOSCOPY;  Service: Endoscopy;  Laterality: N/A;  . GIVENS CAPSULE STUDY N/A 11/29/2016   Procedure: GIVENS CAPSULE STUDY;  Surgeon: [name], MD;  Location: [place];  Service: Endoscopy;  Laterality: N/A;  . REVISON OF ARTERIOVENOUS FISTULA Left 07/02/2015   Procedure: REVISON OF LEFT RADIOCEPHALIC ARTERIOVENOUS FISTULA;  Surgeon: [name], MD;  Location: [place];  Service: Vascular;  Laterality: Left;     OB History   None      Home Medications    Prior to Admission medications   Medication Sig Start Date End Date Taking? Authorizing Provider  ALPRAZolam Duanne Moron) 1 MG tablet Take 1 mg by mouth at bedtime.  11/18/17   [provider]  AMITIZA 24 MCG capsule Take 24 mcg by mouth 2 (two) times daily with a meal.  06/05/17   [provider]  amLODipine (NORVASC) 5 MG tablet Take 1 tablet (5 mg total) by mouth daily. 01/20/15   [name], NP  calcitRIOL (ROCALTROL) 0.5 MCG capsule Take 0.5 mcg by mouth daily. 04/09/18   [provider]  carvedilol (COREG) 12.5 MG tablet TAKE 1 TABLET(12.5 MG) BY MOUTH TWICE DAILY Patient taking differently: Take 12.5 mg by mouth 2 (two) times daily with a meal.  03/14/16   [name], NP  folic acid (FOLVITE) 1 MG tablet Take 1 tablet (1 mg total) by mouth daily. 04/24/18   [name], MD  losartan (COZAAR) 25 MG tablet Take 1 tablet (25 mg total) by mouth daily. 04/22/14   Larey Dresser, MD  rosuvastatin (CRESTOR) 5 MG tablet TAKE 1 TABLET(5 MG) BY MOUTH  DAILY Patient taking differently: Take 5 mg by mouth at bedtime.  06/27/17   [name], NP  sevelamer carbonate (RENVELA) 800 MG tablet Take 1,600 mg 3 (three) times daily with meals by mouth.  12/06/16   [provider]  trimethoprim (TRIMPEX) 100 MG tablet Take 50 mg by mouth daily.  11/07/14   [provider]  vitamin B-12 (CYANOCOBALAMIN) 500 MCG tablet Take 1 tablet (500 mcg total) by mouth daily. 04/23/18   [name], MD  metoprolol succinate (TOPROL-XL) 50 MG 24 hr tablet Take 50 mg by mouth 2 (two) times daily. Take with or immediately following a meal.  05/15/18  [provider]    Family History Family History  Adopted: Yes  Family history unknown: Yes    Social History Social History  Tobacco Use  . Smoking status: Former Smoker    Types: Cigarettes  . Smokeless tobacco: Never Used  . Tobacco comment: quit 1988  Substance Use Topics  . Alcohol use: Yes    Comment: beer daily  . Drug use: No     Allergies   Lipitor [atorvastatin] and Strawberry extract   Review of Systems Review of Systems  Constitutional: Positive for fatigue. Negative for chills and fever.  HENT: Negative for ear pain and sore throat.   Eyes: Negative for pain and visual disturbance.  Respiratory: Negative for cough and shortness of breath.   Cardiovascular: Positive for chest pain. Negative for palpitations and leg swelling.  Gastrointestinal: Negative for abdominal pain, anal bleeding, diarrhea, nausea and vomiting.  Genitourinary: Negative for dysuria and hematuria.  Musculoskeletal: Negative for arthralgias and back pain.  Skin: Negative for color change and rash.  Neurological: Negative for seizures and syncope.  All other systems reviewed and are negative.    Physical Exam Updated Vital Signs BP 101/62   Pulse (!) 104   Resp 20   Ht 5\' 8"  (1.727 m)   Wt 53.5 kg   SpO2 93%   BMI 17.94 kg/m   Physical Exam  Constitutional: She appears  well-developed and well-nourished.  Non-toxic appearance. She does not appear ill. No distress.  Cachectic  HENT:  Head: Normocephalic and atraumatic.  Eyes: Conjunctivae are normal.  Neck: Neck supple.  Cardiovascular: Normal rate and regular rhythm.  No murmur heard. Pulmonary/Chest: Effort normal and breath sounds normal. No respiratory distress.  Abdominal: Soft. There is no tenderness.  Genitourinary: Rectal exam shows guaiac positive stool. Rectal exam shows no external hemorrhoid, no internal hemorrhoid and no fissure.  Musculoskeletal: She exhibits no edema.  Neurological: She is alert.  Skin: Skin is warm and dry.  Psychiatric: She has a normal mood and affect.  Nursing note and vitals reviewed.    ED Treatments / Results  Labs (all labs ordered are listed, but only abnormal results are displayed) Labs Reviewed  BASIC METABOLIC PANEL - Abnormal; Notable for the following components:      Result Value   Sodium 133 (*)    Chloride 95 (*)    Glucose, Bld 229 (*)    Creatinine, Ser 3.29 (*)    Calcium 8.6 (*)    GFR calc non Af Amer 13 (*)    GFR calc Af Amer 15 (*)    All other components within normal limits  CBC - Abnormal; Notable for the following components:   RBC 2.04 (*)    Hemoglobin 7.3 (*)    HCT 23.7 (*)    MCV 116.2 (*)    MCH 35.8 (*)    Platelets 132 (*)    All other components within normal limits  TROPONIN I - Abnormal; Notable for the following components:   Troponin I 1.35 (*)    All other components within normal limits  I-STAT TROPONIN, ED - Abnormal; Notable for the following components:   Troponin i, poc 0.96 (*)    All other components within normal limits  POC OCCULT BLOOD, ED - Abnormal; Notable for the following components:   Fecal Occult Bld POSITIVE (*)    All other components within normal limits  OCCULT BLOOD X 1 CARD TO LAB, STOOL  TYPE AND SCREEN  PREPARE RBC (CROSSMATCH)    EKG EKG Interpretation  Date/Time:  Tuesday  May 15 2018 12:28:59 EDT Ventricular Rate:  97 PR Interval:  QRS Duration: 155 QT Interval:  343 QTC Calculation: 436 R Axis:   169 Text Interpretation:  Sinus tachycardia Atrial premature complexes Nonspecific intraventricular conduction delay Left bundle branch block ST-t wave abnormality Abnormal ekg Confirmed by Carmin Muskrat [phone]) on 05/15/2018 12:40:49 PM   Radiology Dg Chest Port 1 View  Result Date: 05/15/2018 CLINICAL DATA:  Chest pain. EXAM: PORTABLE CHEST 1 VIEW COMPARISON:  Radiographs of September 26, 2016. FINDINGS: Stable cardiomegaly. Status post coronary bypass graft. Atherosclerosis of thoracic aorta. Right internal jugular dialysis catheter is noted with tip in expected position of cavoatrial junction. No pneumothorax or pleural effusion is noted. Both lungs are clear. The visualized skeletal structures are unremarkable. IMPRESSION: No acute cardiopulmonary abnormality seen. Interval placement of right internal jugular dialysis catheter with tip in expected position of cavoatrial junction. Aortic Atherosclerosis (ICD10-I70.0). Electronically Signed   By: Marijo Conception, M.D.   On: 05/15/2018 13:37    Procedures Procedures (including critical care time)  Medications Ordered in ED Medications  0.9 %  sodium chloride infusion (Manually program via Guardrails IV Fluids) (has no administration in time range)     Initial Impression / Assessment and Plan / ED Course  I have reviewed the triage vital signs and the nursing notes.  Pertinent labs & imaging results that were available during my care of the patient were reviewed by me and considered in my medical decision making (see chart for details).     77 year old female PMH significant for CABG, HFpEF, chronic anemia, T2DM, CKD stage IV, HD on MWF, history of GI bleed requiring 6 units of blood, presents with exertional chest pain.  History as above.  ED X includes ACS, versus stable angina versus symptomatic  anemia.  Hemogram was stable with no chest pain on arrival.  EKG shows evidence of left bundle branch block without signs of acute ischemia.  Labs and imaging performed.  Patient noted to have hemoglobin of 7.3.  Baseline roughly 10.0.  Last draw 4 weeks ago was 9.7.  Initial troponin 0 0.96, repeat 1.35.  Occult positive for blood.  Troponinemia likely secondary to demand ischemia secondary to severe anemia in the setting of known cardiac disease.  Patient consented and transfused 2 units PRBC.  Patient admitted to hospitalist service for further management of symptomatic anemia.     Final Clinical Impressions(s) / ED Diagnoses   Final diagnoses:  Symptomatic anemia    ED Discharge Orders    None       Keenan Bachelor, MD 05/15/18 1604    Keenan Bachelor, MD 05/15/18 1605    Carmin Muskrat, MD 05/18/18 2156

## 2018-05-16 ENCOUNTER — Encounter (HOSPITAL_COMMUNITY): Payer: Self-pay | Admitting: Nephrology

## 2018-05-16 ENCOUNTER — Observation Stay (HOSPITAL_BASED_OUTPATIENT_CLINIC_OR_DEPARTMENT_OTHER): Payer: Medicare HMO

## 2018-05-16 ENCOUNTER — Other Ambulatory Visit: Payer: Self-pay

## 2018-05-16 DIAGNOSIS — D649 Anemia, unspecified: Secondary | ICD-10-CM | POA: Diagnosis not present

## 2018-05-16 DIAGNOSIS — I5021 Acute systolic (congestive) heart failure: Secondary | ICD-10-CM

## 2018-05-16 DIAGNOSIS — N186 End stage renal disease: Secondary | ICD-10-CM | POA: Diagnosis not present

## 2018-05-16 DIAGNOSIS — I420 Dilated cardiomyopathy: Secondary | ICD-10-CM | POA: Diagnosis not present

## 2018-05-16 DIAGNOSIS — R079 Chest pain, unspecified: Secondary | ICD-10-CM | POA: Diagnosis present

## 2018-05-16 DIAGNOSIS — Z951 Presence of aortocoronary bypass graft: Secondary | ICD-10-CM | POA: Diagnosis not present

## 2018-05-16 DIAGNOSIS — E1122 Type 2 diabetes mellitus with diabetic chronic kidney disease: Secondary | ICD-10-CM | POA: Diagnosis present

## 2018-05-16 DIAGNOSIS — E8889 Other specified metabolic disorders: Secondary | ICD-10-CM | POA: Diagnosis present

## 2018-05-16 DIAGNOSIS — I700 Atherosclerosis of aorta: Secondary | ICD-10-CM | POA: Diagnosis present

## 2018-05-16 DIAGNOSIS — I132 Hypertensive heart and chronic kidney disease with heart failure and with stage 5 chronic kidney disease, or end stage renal disease: Secondary | ICD-10-CM | POA: Diagnosis not present

## 2018-05-16 DIAGNOSIS — Z992 Dependence on renal dialysis: Secondary | ICD-10-CM

## 2018-05-16 DIAGNOSIS — Z681 Body mass index (BMI) 19 or less, adult: Secondary | ICD-10-CM | POA: Diagnosis not present

## 2018-05-16 DIAGNOSIS — E785 Hyperlipidemia, unspecified: Secondary | ICD-10-CM | POA: Diagnosis present

## 2018-05-16 DIAGNOSIS — I252 Old myocardial infarction: Secondary | ICD-10-CM | POA: Diagnosis not present

## 2018-05-16 DIAGNOSIS — I447 Left bundle-branch block, unspecified: Secondary | ICD-10-CM | POA: Diagnosis not present

## 2018-05-16 DIAGNOSIS — I340 Nonrheumatic mitral (valve) insufficiency: Secondary | ICD-10-CM | POA: Diagnosis not present

## 2018-05-16 DIAGNOSIS — I120 Hypertensive chronic kidney disease with stage 5 chronic kidney disease or end stage renal disease: Secondary | ICD-10-CM | POA: Diagnosis not present

## 2018-05-16 DIAGNOSIS — I2582 Chronic total occlusion of coronary artery: Secondary | ICD-10-CM | POA: Diagnosis present

## 2018-05-16 DIAGNOSIS — R64 Cachexia: Secondary | ICD-10-CM | POA: Diagnosis not present

## 2018-05-16 DIAGNOSIS — E119 Type 2 diabetes mellitus without complications: Secondary | ICD-10-CM | POA: Diagnosis not present

## 2018-05-16 DIAGNOSIS — I214 Non-ST elevation (NSTEMI) myocardial infarction: Secondary | ICD-10-CM | POA: Insufficient documentation

## 2018-05-16 DIAGNOSIS — I5043 Acute on chronic combined systolic (congestive) and diastolic (congestive) heart failure: Secondary | ICD-10-CM | POA: Diagnosis not present

## 2018-05-16 DIAGNOSIS — I248 Other forms of acute ischemic heart disease: Secondary | ICD-10-CM | POA: Diagnosis not present

## 2018-05-16 DIAGNOSIS — D631 Anemia in chronic kidney disease: Secondary | ICD-10-CM | POA: Diagnosis not present

## 2018-05-16 DIAGNOSIS — I2510 Atherosclerotic heart disease of native coronary artery without angina pectoris: Secondary | ICD-10-CM | POA: Diagnosis present

## 2018-05-16 DIAGNOSIS — I491 Atrial premature depolarization: Secondary | ICD-10-CM | POA: Diagnosis present

## 2018-05-16 DIAGNOSIS — I350 Nonrheumatic aortic (valve) stenosis: Secondary | ICD-10-CM | POA: Diagnosis not present

## 2018-05-16 DIAGNOSIS — I5041 Acute combined systolic (congestive) and diastolic (congestive) heart failure: Secondary | ICD-10-CM | POA: Diagnosis not present

## 2018-05-16 DIAGNOSIS — I454 Nonspecific intraventricular block: Secondary | ICD-10-CM | POA: Diagnosis present

## 2018-05-16 DIAGNOSIS — E871 Hypo-osmolality and hyponatremia: Secondary | ICD-10-CM | POA: Diagnosis not present

## 2018-05-16 DIAGNOSIS — D696 Thrombocytopenia, unspecified: Secondary | ICD-10-CM | POA: Diagnosis not present

## 2018-05-16 DIAGNOSIS — E1140 Type 2 diabetes mellitus with diabetic neuropathy, unspecified: Secondary | ICD-10-CM | POA: Diagnosis present

## 2018-05-16 DIAGNOSIS — I25810 Atherosclerosis of coronary artery bypass graft(s) without angina pectoris: Secondary | ICD-10-CM | POA: Diagnosis not present

## 2018-05-16 LAB — CBC
HEMATOCRIT: 29.8 % — AB (ref 36.0–46.0)
HEMATOCRIT: 32.5 % — AB (ref 36.0–46.0)
Hemoglobin: 9.6 g/dL — ABNORMAL LOW (ref 12.0–15.0)
Hemoglobin: 10.4 g/dL — ABNORMAL LOW (ref 12.0–15.0)
MCH: 33.8 pg (ref 26.0–34.0)
MCH: 33.7 pg (ref 26.0–34.0)
MCHC: 32.2 g/dL (ref 30.0–36.0)
MCHC: 32 g/dL (ref 30.0–36.0)
MCV: 104.9 fL — AB (ref 78.0–100.0)
MCV: 105.2 fL — ABNORMAL HIGH (ref 78.0–100.0)
PLATELETS: 100 10*3/uL — AB (ref 150–400)
PLATELETS: 111 10*3/uL — AB (ref 150–400)
RBC: 2.84 MIL/uL — ABNORMAL LOW (ref 3.87–5.11)
RBC: 3.09 MIL/uL — ABNORMAL LOW (ref 3.87–5.11)
RDW: 19.2 % — AB (ref 11.5–15.5)
RDW: 18.8 % — AB (ref 11.5–15.5)
WBC: 5.6 10*3/uL (ref 4.0–10.5)
WBC: 5.5 10*3/uL (ref 4.0–10.5)

## 2018-05-16 LAB — TYPE AND SCREEN
ABO/RH(D): O NEG
ANTIBODY SCREEN: NEGATIVE
UNIT DIVISION: 0
Unit division: 0

## 2018-05-16 LAB — BASIC METABOLIC PANEL
Anion gap: 11 (ref 5–15)
BUN: 28 mg/dL — AB (ref 8–23)
CHLORIDE: 95 mmol/L — AB (ref 98–111)
CO2: 25 mmol/L (ref 22–32)
CREATININE: 3.54 mg/dL — AB (ref 0.44–1.00)
Calcium: 8.5 mg/dL — ABNORMAL LOW (ref 8.9–10.3)
GFR calc Af Amer: 13 mL/min — ABNORMAL LOW (ref >60)
GFR calc non Af Amer: 12 mL/min — ABNORMAL LOW (ref >60)
GLUCOSE: 133 mg/dL — AB (ref 70–99)
POTASSIUM: 4.2 mmol/L (ref 3.5–5.1)
Sodium: 131 mmol/L — ABNORMAL LOW (ref 135–145)

## 2018-05-16 LAB — TROPONIN I
Troponin I: 1.96 ng/mL (ref <0.03)
Troponin I: 1.48 ng/mL (ref <0.03)

## 2018-05-16 LAB — BPAM RBC
BLOOD PRODUCT EXPIRATION DATE: 201910182359
Blood Product Expiration Date: 201910182359
ISSUE DATE / TIME: 201910011639
ISSUE DATE / TIME: 201910012038
UNIT TYPE AND RH: 9500
Unit Type and Rh: 9500

## 2018-05-16 LAB — ECHOCARDIOGRAM COMPLETE
Height: 68 in
Weight: 1881.6 oz

## 2018-05-16 MED ORDER — ALPRAZOLAM 0.25 MG PO TABS
0.2500 mg | ORAL_TABLET | Freq: Three times a day (TID) | ORAL | Status: DC | PRN
  Administered 2018-05-16: 0.25 mg via ORAL
  Filled 2018-05-16: qty 1

## 2018-05-16 MED ORDER — CHLORHEXIDINE GLUCONATE CLOTH 2 % EX PADS
6.0000 | MEDICATED_PAD | Freq: Every day | CUTANEOUS | Status: DC
  Administered 2018-05-16 – 2018-05-18 (×2): 6 via TOPICAL

## 2018-05-16 MED ORDER — HEPARIN SODIUM (PORCINE) 1000 UNIT/ML IJ SOLN
INTRAMUSCULAR | Status: AC
  Administered 2018-05-16: 3600 [IU]
  Filled 2018-05-16: qty 4

## 2018-05-16 NOTE — Progress Notes (Signed)
Progress Note  Patient Name: [name] Date of Encounter: 05/16/2018  Primary Cardiologist: [facility]/ [name]  Subjective   Weak wondering what plans are  Inpatient Medications    Scheduled Meds: . ALPRAZolam  1 mg Oral QHS  . amLODipine  5 mg Oral QHS  . calcitRIOL  0.5 mcg Oral Daily  . [START ON 05/17/2018] carvedilol  12.5 mg Oral Q T,Th,S,Su  . folic acid  1 mg Oral Daily  . [START ON 05/17/2018] lubiprostone  24 mcg Oral QODAY  . rosuvastatin  5 mg Oral QHS  . sevelamer carbonate  1,600 mg Oral TID WC  . sodium chloride flush  3 mL Intravenous Q12H  . trimethoprim  50 mg Oral Daily   Continuous Infusions: . sodium chloride 250 mL (05/15/18 2101)   PRN Meds: sodium chloride, acetaminophen **OR** acetaminophen, calcium carbonate (dosed in mg elemental calcium), camphor-menthol **AND** hydrOXYzine, docusate sodium, feeding supplement (NEPRO CARB STEADY), ondansetron **OR** ondansetron (ZOFRAN) IV, sorbitol, zolpidem   Vital Signs    Vitals:   05/15/18 2031 05/15/18 2106 05/15/18 2320 05/16/18 0511  BP: 108/69 116/76 114/75 124/79  Pulse: 95 96 92 97  Resp: 20 20 18 14   Temp: 98.1 F (36.7 C) (!) 97.5 F (36.4 C) 98.2 F (36.8 C) 98.2 F (36.8 C)  TempSrc: Oral Oral Oral Oral  SpO2: 97% 100% 96% 93%  Weight:      Height:        Intake/Output Summary (Last 24 hours) at 05/16/2018 0925 Last data filed at 05/16/2018 0600 Gross per 24 hour  Intake 927.97 ml  Output 0 ml  Net 927.97 ml   Filed Weights   05/15/18 1234 05/15/18 1944  Weight: 53.5 kg 53.3 kg    Telemetry    NSR  - Personally Reviewed  ECG    New LBBB  - Personally Reviewed  Physical Exam  Pale elderly female  GEN: No acute distress.   Neck: No JVD Cardiac: RRR, AS/MR murmurs, rubs, or gallops.  Respiratory: Clear to auscultation bilaterally. GI: Soft, nontender, non-distended  MS: No edema; No deformity. Neuro:  Nonfocal  Psych: Normal affect  Clotted fistula LUE and dialysis  catheter right subclavian   Labs    Chemistry Recent Labs  Lab 05/15/18 1241 05/16/18 0620  NA 133* 131*  K 4.1 4.2  CL 95* 95*  CO2 27 25  GLUCOSE 229* 133*  BUN 22 28*  CREATININE 3.29* 3.54*  CALCIUM 8.6* 8.5*  GFRNONAA 13* 12*  GFRAA 15* 13*  ANIONGAP 11 11     Hematology Recent Labs  Lab 05/15/18 1241 05/16/18 0620  WBC 7.3 5.6  RBC 2.04* 2.84*  HGB 7.3* 9.6*  HCT 23.7* 29.8*  MCV 116.2* 104.9*  MCH 35.8* 33.8  MCHC 30.8 32.2  RDW 14.7 19.2*  PLT 132* PENDING    Cardiac Enzymes Recent Labs  Lab 05/15/18 1453 05/15/18 2000 05/16/18 0021 05/16/18 0620  TROPONINI 1.35* 1.83* 1.96* 1.48*    Recent Labs  Lab 05/15/18 1236  TROPIPOC 0.96*     BNPNo results for input(s): BNP, PROBNP in the last 168 hours.   DDimer No results for input(s): DDIMER in the last 168 hours.   Radiology    Dg Chest Port 1 View  Result Date: 05/15/2018 CLINICAL DATA:  Chest pain. EXAM: PORTABLE CHEST 1 VIEW COMPARISON:  Radiographs of September 26, 2016. FINDINGS: Stable cardiomegaly. Status post coronary bypass graft. Atherosclerosis of thoracic aorta. Right internal jugular dialysis catheter is noted  with tip in expected position of cavoatrial junction. No pneumothorax or pleural effusion is noted. Both lungs are clear. The visualized skeletal structures are unremarkable. IMPRESSION: No acute cardiopulmonary abnormality seen. Interval placement of right internal jugular dialysis catheter with tip in expected position of cavoatrial junction. Aortic Atherosclerosis (ICD10-I70.0). Electronically Signed   By: Marijo Conception, M.D.   On: 05/15/2018 13:37    Cardiac Studies   TTE this am reviewed EF appears to be 25% range marked change form 2018 inferior And apical akinesis. Likely low flow moderate to severe AS and moderate MR/TR  Patient Profile     77 y.o. female admitted with weakness, anemia, newly on dialysis with clotted LUE fistula Distant history of CABG, AS and new  LBBB  Assessment & Plan    1. CHF:  Will need more fluid drawn from dialysis. Etiology of decreased EF not clear Troponin minimaly Elevated in setting of renal failure but new LBBB and wall motion abnormality on echo Discussed need For right and left cath with grafts Likely Friday. She is not an ideal intervention candidate with 6 unit GI bleed Last year and valve disease  2. AS: likely low flow moderate to severe AS Patient inquired about TAVR but needs to assess coronary arteries first And also has significant MR/TR   Can get further hemodynamic data and cross valve cath   3. Anemia per primary service has had iv iron related to chronic GI blood loss and CRF  4. CRF:  Failed graft has dialysis catheter right subclavian for dialysis today will need to draw more fluid for CHF      For questions or updates, please contact [place] Please consult [email or web address] for contact info under        Signed, Jenkins Rouge, MD  05/16/2018, 9:25 AM

## 2018-05-16 NOTE — Care Management Obs Status (Signed)
[facility] NOTIFICATION   Patient Details  Name: [name] MRN: [id number] Date of Birth: 12/22/1940   Medicare Observation Status Notification Given:  Yes    Carles Collet, RN 05/16/2018, 11:45 AM

## 2018-05-16 NOTE — Progress Notes (Signed)
Inpatient Diabetes Program Recommendations  AACE/ADA: New Consensus Statement on Inpatient Glycemic Control (2015)  Target Ranges:  Prepandial:   less than 140 mg/dL      Peak postprandial:   less than 180 mg/dL (1-2 hours)      Critically ill patients:  140 - 180 mg/dL   Results for Jeanne, [name] (MRN [id number]) as of 05/16/2018 11:37  Ref. Range 05/15/2018 12:41 05/16/2018 06:20  Glucose Latest Ref Range: 70 - 99 mg/dL 229 (H) 133 (H)   Review of Glycemic Control  Diabetes history: DM 2 Outpatient Diabetes medications: None Current orders for Inpatient glycemic control: None  Inpatient Diabetes Program Recommendations:    Glucose trends in labs 229, 133. Consider CBG checks and if elevated Novolog Correction starting at 150 mg/dl.  -Custom Novolog correction scale 0-5 units       151-200  1 unit      201-250  2 units      251-300  3 units      301-350  4 units      351-400  5 units  A1c would not be accurate due to low Hgb levels.  Thanks,  Tama Headings RN, MSN, BC-ADM Inpatient Diabetes Coordinator Team Pager [phone] (8a-5p)

## 2018-05-16 NOTE — Consult Note (Signed)
Renal Service Consult Note [facility]  [name] 05/16/2018 [name] Requesting Physician:  Dr Posey Pronto, P  Reason for Consult:  ESRD pt w/ gen'd weakness HPI: The patient is a 77 y.o. year-old w/ hx of ESRD started HD 1 mo ago, last week her AVF "collapsed" and couldn't be opened so they put a catheter in.  She gets anemia injections at cancer center every 3 wks or so.  She was feeling very weak so called her heart doctor and she went to her heart doctors office where she was very weak and heart was "racing" so they sent her to ED in an ambulance.    Since HD started she has got a little bit of her energy back and appetite but she is still extremely weak.  She had CABG 3V in 2010.  They told her she may need a new heart cath soon.  She states she is not able to do much for herself , she can do some of the very basic tasks of self care but not much more.    Pt went to HD on Monday, usual HD is MWF. No recent HD issues except w/ her AVF which was always "small".  Using [facility] now.    In ED K 4.2  BUN 28  Cr 3.5  Hb 7.3, plt 132 WBC 7k.  No fevers, BP 125/ 80 HR 105 NSR  RA 93% spO2   ROS  denies CP  no joint pain   no HA  no blurry vision  no rash  no diarrhea  no nausea/ vomiting  no dysuria  no difficulty voiding  no change in urine color    Past Medical History  Past Medical History:  Diagnosis Date  . Anemia    Pt is taking iron.   [name] Anxiety   . Arthritis   . Carotid stenosis    40-59% bilateral ICA stenosis in 2/12.  . Chronic low back pain   . CKD (chronic kidney disease)    Dr. Audie Clear at [facility]  . Coronary artery disease    Pt presented 2/10 to [facility] with NSTEMI and diastolic CHF exacerbation.  LHC was done  3/10 showing 99% pRCA stenosis and 80% calcified pLAD stenosis with L=>R collaterals.  Pt was referred  for CABG which was done by Dr. Prescott Gum with LIMA-LAD, SVG-RCA, SVG-OM.  . Diabetes mellitus   . Diabetic  neuropathy ([place])   . Diastolic CHF (HCC)    Echo (2/10) showed EF 55-65%, mild LVH, diastolic dysfunction, mild AS with mean gradient 12 mmHg, PASP 43 mmHg.  Echo (2/12): EF 55-60%, mild LVH, mild AS (mean gradient 12), PA systolic pressure 32 mmHg.     [name] GERD (gastroesophageal reflux disease)   . Heart murmur   . Hyperlipidemia   . Hypertension   . Mild aortic stenosis    mean gradient 12 mmHg in 2/12.  . Myocardial infarction ([place])    "mild"  . Pneumonia   . PONV (postoperative nausea and vomiting)   . Thrombocytopenia ([place])   . Unsteady gait    Past Surgical History  Past Surgical History:  Procedure Laterality Date  . AV FISTULA PLACEMENT Left 01/30/2015   Procedure: Creation of a Radial Cephalic AV Fistula left wrist;  Surgeon: [name], MD;  Location: [place];  Service: Vascular;  Laterality: Left;  . BACK SURGERY     multiple  . BREAST SURGERY     biopsy  .  CARDIAC CATHETERIZATION    . CATARACT EXTRACTION W/ INTRAOCULAR LENS  IMPLANT, BILATERAL    . COLONOSCOPY Left 11/03/2016   Procedure: COLONOSCOPY;  Surgeon: [name], MD;  Location: [facility] ENDOSCOPY;  Service: Endoscopy;  Laterality: Left;  . COLONOSCOPY W/ BIOPSIES AND POLYPECTOMY    . CORONARY ARTERY BYPASS GRAFT  09/2008   pt with NSTEMI and diastolic CHF exacerbation.  LHC was done  3/10 showing 99% pRCA stenosis and 80% calcified pLAD stenosis with L=>R collaterals.  Pt was referred  for CABG which was done by Dr. Prescott Gum with LIMA-LAD, SVG-RCA, SVG-OM.  [name] ESOPHAGOGASTRODUODENOSCOPY N/A 11/02/2016   Procedure: ESOPHAGOGASTRODUODENOSCOPY (EGD);  Surgeon: [name], MD;  Location: [facility] ENDOSCOPY;  Service: Endoscopy;  Laterality: N/A;  . GIVENS CAPSULE STUDY N/A 11/29/2016   Procedure: GIVENS CAPSULE STUDY;  Surgeon: [name], MD;  Location: [place];  Service: Endoscopy;  Laterality: N/A;  . REVISON OF ARTERIOVENOUS FISTULA Left 07/02/2015   Procedure: REVISON OF LEFT RADIOCEPHALIC ARTERIOVENOUS FISTULA;   Surgeon: [name], MD;  Location: [facility] OR;  Service: Vascular;  Laterality: Left;   Family History  Family History  Adopted: Yes  Family history unknown: Yes   Social History  reports that she has quit smoking. Her smoking use included cigarettes. She has never used smokeless tobacco. She reports that she drinks alcohol. She reports that she does not use drugs. Allergies  Allergies  Allergen Reactions  . Doxycycline Nausea And Vomiting    Caused "DEATHLY NAUSEA AND VOMITING"  . Lipitor [Atorvastatin] Other (See Comments)    Stomach pain  . Strawberry Extract Rash   Home medications Prior to Admission medications   Medication Sig Start Date End Date Taking? Authorizing Provider  ALPRAZolam Duanne Moron) 1 MG tablet Take 1 mg by mouth at bedtime.  11/18/17  Yes [provider]  AMITIZA 24 MCG capsule Take 24 mcg by mouth See admin instructions. Take 24 mcg by mouth two times a day, every other day 06/05/17  Yes [provider]  amLODipine (NORVASC) 5 MG tablet Take 1 tablet (5 mg total) by mouth daily. Patient taking differently: Take 5 mg by mouth at bedtime.  01/20/15  Yes [name], NP  b complex-vitamin c-folic acid (NEPHRO-VITE) 0.8 MG TABS tablet Take 1 tablet by mouth daily. 05/01/18  Yes [provider]  calcitRIOL (ROCALTROL) 0.5 MCG capsule Take 0.5 mcg by mouth daily. 04/09/18  Yes [provider]  carvedilol (COREG) 12.5 MG tablet TAKE 1 TABLET(12.5 MG) BY MOUTH TWICE DAILY Patient taking differently: Take 12.5 mg by mouth every Tuesday, Thursday, Saturday, and Sunday.  03/14/16  Yes [name], NP  folic acid (FOLVITE) 1 MG tablet Take 1 tablet (1 mg total) by mouth daily. 04/24/18  Yes [name], MD  losartan (COZAAR) 25 MG tablet Take 1 tablet (25 mg total) by mouth daily. 04/22/14  Yes Larey Dresser, MD  rosuvastatin (CRESTOR) 5 MG tablet TAKE 1 TABLET(5 MG) BY MOUTH DAILY Patient taking differently: Take 5 mg by mouth at  bedtime.  06/27/17  Yes [name], NP  sevelamer carbonate (RENVELA) 800 MG tablet Take 1,600 mg 3 (three) times daily with meals by mouth.  12/06/16  Yes [provider]  trimethoprim (TRIMPEX) 100 MG tablet Take 50 mg by mouth daily.  11/07/14  Yes [provider]  vitamin B-12 (CYANOCOBALAMIN) 500 MCG tablet Take 1 tablet (500 mcg total) by mouth daily. 04/23/18  Yes [name], MD  metoprolol succinate (TOPROL-XL) 50 MG 24 hr tablet Take 50 mg by mouth 2 (two) times daily. Take with or immediately following a meal.  05/15/18  [provider]   Liver Function Tests No results for input(s): AST, ALT, ALKPHOS, BILITOT, PROT, ALBUMIN in the last 168 hours. No results for input(s): LIPASE, AMYLASE in the last 168 hours. CBC Recent Labs  Lab 05/15/18 1241 05/16/18 0620  WBC 7.3 5.6  HGB 7.3* 9.6*  HCT 23.7* 29.8*  MCV 116.2* 104.9*  PLT 132* PENDING   Basic Metabolic Panel Recent Labs  Lab 05/15/18 1241 05/16/18 0620  NA 133* 131*  K 4.1 4.2  CL 95* 95*  CO2 27 25  GLUCOSE 229* 133*  BUN 22 28*  CREATININE 3.29* 3.54*  CALCIUM 8.6* 8.5*   Iron/TIBC/Ferritin/ %Sat    Component Value Date/Time   IRON 73 03/21/2018 1104   IRON 82 06/21/2017 0811   TIBC 335 03/21/2018 1104   TIBC 375 06/21/2017 0811   FERRITIN 57 03/21/2018 1104   FERRITIN 15 06/21/2017 0811   IRONPCTSAT 22 03/21/2018 1104   IRONPCTSAT 22 06/21/2017 0811    Vitals:   05/15/18 2031 05/15/18 2106 05/15/18 2320 05/16/18 0511  BP: 108/69 116/76 114/75 124/79  Pulse: 95 96 92 97  Resp: 20 20 18 14   Temp: 98.1 F (36.7 C) (!) 97.5 F (36.4 C) 98.2 F (36.8 C) 98.2 F (36.8 C)  TempSrc: Oral Oral Oral Oral  SpO2: 97% 100% 96% 93%  Weight:      Height:       Exam Gen frail elderly WF , not in distress, tires out when she talks No rash, cyanosis or gangrene Sclera anicteric, throat clear +JVD to angle of jaw Chest air movement poor, no rales or wheezing RRR +2/6 sem  no RG Abd soft ntnd no mass or ascites +bs  GU defer MS no joint effusions or deformity Ext no LE edema, no wounds or ulcers Neuro is alert, Ox 3 , nf , gen weak R IJ TDC, left arm AVF no bruit     CXR 10/1 - no acute disease   Dialysis: MWF [facility]  4h   49kg  2/2.25 bath  R TDC (AVF clottted)  Hep 1000 - hect 2ug tiw - mircera 50 every 2 last 9/23     Impression: 1. Anemia - sp prbc's., getting ESA as OP, +stool.  Per primary.  2. ESRD on HD . HD today 3. Volume - stable, no edema/ normal CXR 4. CAD hx CABG/ pulm HTN/ low EF 20-25% 5. Gen weakness - w/u in progress 6. HTN - on losartan/ coreg/ norvasc, may need reduce meds  Plan - as above  Kelly Splinter MD [place] pager [phone]   05/16/2018, 9:32 AM

## 2018-05-16 NOTE — Progress Notes (Signed)
  Echocardiogram 2D Echocardiogram has been performed.  [name] 05/16/2018, 8:39 AM

## 2018-05-16 NOTE — Progress Notes (Signed)
[facility] Progress Note  Patient: [name] TKP:[id number]   PCP: [name], MD DOB: 09/12/1940   DOA: 05/15/2018   DOS: 05/16/2018   Date of Service: the patient was seen and examined on 05/16/2018  Brief hospital course: Pt. with PMH of CAD S/P PCI, HTN, HLD, HFpEF chronic, GI bleed, type II DM, ESRD on MWF HD; admitted on 05/15/2018, presented with complaint of chest pain and shortness of breath, was found to have symptomatic anemia, non-STEMI. Currently further plan is supportive measures and further cardiac work-up as well as stabilization of CHF.  Subjective: Patient complains of having some chest tightness as well as palpitation.  Feels anxious after receiving her diagnosis.  On reevaluation was unremarkable after receiving Xanax.  Assessment and Plan: 1.  Type II non-STEMI. Acute on chronic combined CHF. Severe aortic stenosis. Significant MR TR. Presents with complaints of chest pain. Troponin peaked at 1.96 now trending down. EKG unremarkable for any significant abnormality. Cardiology consulted. Tentatively scheduled for cardiac catheterization on Friday after stabilization of her CHF. Nephrology will be managing fluid removal. Appreciate their assistance as well. Conservative management given her history of intra-abdominal bleeding in the past.  2.  ESRD on HD. Nephrology consulted appreciate their assistance.  3.  Symptomatic anemia. Likely etiology of her type II non-STEMI. No active bleeding reported by the patient. FOBT is positive. Receiving 2 PRBC hemoglobin appropriately elevated. We will recheck and monitor every 12 hours. Transfuse for hemoglobin less than 9 given her acute ACS. Require GI evaluation, will consult possibly tomorrow.  4.  Essential hypertension. On losartan, Coreg, Norvasc.  Management by cardiology and nephrology.  Diet: Renal diet DVT Prophylaxis: mechanical compression device  Advance goals of care discussion: full  code  Family Communication: family was present at bedside, at the time of interview. The pt provided permission to discuss medical plan with the family. Opportunity was given to ask question and all questions were answered satisfactorily.   Disposition:  Discharge to be determined.  Consultants: cardiology nephrology Procedures: Echocardiogram   Scheduled Meds: . ALPRAZolam  1 mg Oral QHS  . calcitRIOL  0.5 mcg Oral Daily  . [START ON 05/17/2018] carvedilol  12.5 mg Oral Q T,Th,S,Su  . Chlorhexidine Gluconate Cloth  6 each Topical Q0600  . folic acid  1 mg Oral Daily  . heparin      . [START ON 05/17/2018] lubiprostone  24 mcg Oral QODAY  . rosuvastatin  5 mg Oral QHS  . sevelamer carbonate  1,600 mg Oral TID WC  . sodium chloride flush  3 mL Intravenous Q12H  . trimethoprim  50 mg Oral Daily   Continuous Infusions: . sodium chloride 250 mL (05/15/18 2101)   PRN Meds: sodium chloride, acetaminophen **OR** acetaminophen, ALPRAZolam, calcium carbonate (dosed in mg elemental calcium), camphor-menthol **AND** hydrOXYzine, docusate sodium, feeding supplement (NEPRO CARB STEADY), ondansetron **OR** ondansetron (ZOFRAN) IV, sorbitol, zolpidem Antibiotics: Anti-infectives (From admission, onward)   Start     Dose/Rate Route Frequency Ordered Stop   05/16/18 1000  trimethoprim (TRIMPEX) tablet 50 mg     50 mg Oral Daily 05/15/18 1824         Objective: Physical Exam: Vitals:   05/16/18 1530 05/16/18 1600 05/16/18 1630 05/16/18 1700  BP: 124/69 110/64 112/60 126/73  Pulse: 93 86 84 85  Resp: (!) 26 18 (!) 25 (!) 26  Temp:      TempSrc:      SpO2: 99% 98% 98% 99%  Weight:  Height:        Intake/Output Summary (Last 24 hours) at 05/16/2018 1743 Last data filed at 05/16/2018 1300 Gross per 24 hour  Intake 927.97 ml  Output 0 ml  Net 927.97 ml   Filed Weights   05/15/18 1234 05/15/18 1944 05/16/18 1335  Weight: 53.5 kg 53.3 kg 53.6 kg   General: Alert, Awake and Oriented  to Time, Place and Person. Appear in marked distress, affect blunted Eyes: PERRL, Conjunctiva normal ENT: Oral Mucosa clear moist. Neck: difficult to assess  JVD, no Abnormal Mass Or lumps Cardiovascular: S1 and S2 Present, aortic systolic  Murmur, Peripheral Pulses Present Respiratory: increased respiratory effort, Bilateral Air entry equal and Decreased, no use of accessory muscle, faint basal Crackles, no wheezes Abdomen: Bowel Sound present, Soft and no tenderness, no hernia Skin: no redness, no Rash, no induration Extremities: no Pedal edema, no calf tenderness Neurologic: Grossly no focal neuro deficit. Bilaterally Equal motor strength  Data Reviewed: CBC: Recent Labs  Lab 05/15/18 1241 05/16/18 0620  WBC 7.3 5.6  HGB 7.3* 9.6*  HCT 23.7* 29.8*  MCV 116.2* 104.9*  PLT 132* 600*   Basic Metabolic Panel: Recent Labs  Lab 05/15/18 1241 05/16/18 0620  NA 133* 131*  K 4.1 4.2  CL 95* 95*  CO2 27 25  GLUCOSE 229* 133*  BUN 22 28*  CREATININE 3.29* 3.54*  CALCIUM 8.6* 8.5*    Liver Function Tests: No results for input(s): AST, ALT, ALKPHOS, BILITOT, PROT, ALBUMIN in the last 168 hours. No results for input(s): LIPASE, AMYLASE in the last 168 hours. No results for input(s): AMMONIA in the last 168 hours. Coagulation Profile: No results for input(s): INR, PROTIME in the last 168 hours. Cardiac Enzymes: Recent Labs  Lab 05/15/18 1453 05/15/18 2000 05/16/18 0021 05/16/18 0620  TROPONINI 1.35* 1.83* 1.96* 1.48*   BNP (last 3 results) No results for input(s): PROBNP in the last 8760 hours. CBG: No results for input(s): GLUCAP in the last 168 hours. Studies: No results found.   Time spent: 35 minutes  Author: Berle Mull, MD [facility] Pager: [phone] 05/16/2018 5:43 PM  If 7PM-7AM, please contact night-coverage at [email or web address], password [facility]

## 2018-05-16 NOTE — Procedures (Signed)
   I was present at this dialysis session, have reviewed the session itself and made  appropriate changes Kelly Splinter MD [place] pager [phone]   05/16/2018, 3:04 PM

## 2018-05-17 DIAGNOSIS — I5043 Acute on chronic combined systolic (congestive) and diastolic (congestive) heart failure: Secondary | ICD-10-CM | POA: Diagnosis present

## 2018-05-17 DIAGNOSIS — D649 Anemia, unspecified: Secondary | ICD-10-CM

## 2018-05-17 DIAGNOSIS — I447 Left bundle-branch block, unspecified: Secondary | ICD-10-CM | POA: Diagnosis present

## 2018-05-17 DIAGNOSIS — I350 Nonrheumatic aortic (valve) stenosis: Secondary | ICD-10-CM

## 2018-05-17 DIAGNOSIS — E119 Type 2 diabetes mellitus without complications: Secondary | ICD-10-CM

## 2018-05-17 DIAGNOSIS — I248 Other forms of acute ischemic heart disease: Secondary | ICD-10-CM

## 2018-05-17 LAB — RENAL FUNCTION PANEL
ANION GAP: 9 (ref 5–15)
Albumin: 2.8 g/dL — ABNORMAL LOW (ref 3.5–5.0)
BUN: 14 mg/dL (ref 8–23)
CALCIUM: 8.5 mg/dL — AB (ref 8.9–10.3)
CO2: 27 mmol/L (ref 22–32)
Chloride: 97 mmol/L — ABNORMAL LOW (ref 98–111)
Creatinine, Ser: 2.45 mg/dL — ABNORMAL HIGH (ref 0.44–1.00)
GFR calc non Af Amer: 18 mL/min — ABNORMAL LOW (ref >60)
GFR, EST AFRICAN AMERICAN: 21 mL/min — AB (ref >60)
Glucose, Bld: 108 mg/dL — ABNORMAL HIGH (ref 70–99)
Phosphorus: 3.2 mg/dL (ref 2.5–4.6)
Potassium: 3.8 mmol/L (ref 3.5–5.1)
SODIUM: 133 mmol/L — AB (ref 135–145)

## 2018-05-17 LAB — CBC
HCT: 28.8 % — ABNORMAL LOW (ref 36.0–46.0)
HCT: 27.7 % — ABNORMAL LOW (ref 36.0–46.0)
HEMOGLOBIN: 9.4 g/dL — AB (ref 12.0–15.0)
Hemoglobin: 9 g/dL — ABNORMAL LOW (ref 12.0–15.0)
MCH: 34.1 pg — ABNORMAL HIGH (ref 26.0–34.0)
MCH: 34.4 pg — ABNORMAL HIGH (ref 26.0–34.0)
MCHC: 32.6 g/dL (ref 30.0–36.0)
MCHC: 32.5 g/dL (ref 30.0–36.0)
MCV: 104.3 fL — ABNORMAL HIGH (ref 78.0–100.0)
MCV: 105.7 fL — ABNORMAL HIGH (ref 78.0–100.0)
PLATELETS: 102 10*3/uL — AB (ref 150–400)
Platelets: 105 10*3/uL — ABNORMAL LOW (ref 150–400)
RBC: 2.76 MIL/uL — AB (ref 3.87–5.11)
RBC: 2.62 MIL/uL — AB (ref 3.87–5.11)
RDW: 18.4 % — ABNORMAL HIGH (ref 11.5–15.5)
RDW: 17.9 % — ABNORMAL HIGH (ref 11.5–15.5)
WBC: 5.1 10*3/uL (ref 4.0–10.5)
WBC: 6.6 10*3/uL (ref 4.0–10.5)

## 2018-05-17 LAB — MAGNESIUM: MAGNESIUM: 1.8 mg/dL (ref 1.7–2.4)

## 2018-05-17 LAB — LACTIC ACID, PLASMA: LACTIC ACID, VENOUS: 0.7 mmol/L (ref 0.5–1.9)

## 2018-05-17 LAB — HEMOGLOBIN A1C
Hgb A1c MFr Bld: 6 % — ABNORMAL HIGH (ref 4.8–5.6)
Mean Plasma Glucose: 126 mg/dL

## 2018-05-17 MED ORDER — RENA-VITE PO TABS
1.0000 | ORAL_TABLET | Freq: Every day | ORAL | Status: DC
  Administered 2018-05-17 – 2018-05-18 (×2): 1 via ORAL
  Filled 2018-05-17 (×2): qty 1

## 2018-05-17 MED ORDER — DOXERCALCIFEROL 4 MCG/2ML IV SOLN
2.0000 ug | INTRAVENOUS | Status: DC
  Administered 2018-05-18: 2 ug via INTRAVENOUS
  Filled 2018-05-17: qty 2

## 2018-05-17 MED ORDER — ENSURE ENLIVE PO LIQD
237.0000 mL | Freq: Two times a day (BID) | ORAL | Status: DC
  Administered 2018-05-19: 237 mL via ORAL

## 2018-05-17 NOTE — Progress Notes (Signed)
Initial Nutrition Assessment  DOCUMENTATION CODES:   Severe malnutrition in context of chronic illness, Underweight  INTERVENTION:   Ensure Enlive po BID, each supplement provides 350 kcal and 20 grams of protein  Add Renal MVI daily at bedtime   NUTRITION DIAGNOSIS:   Severe Malnutrition related to chronic illness(ESRD on HD, CHF) as evidenced by severe fat depletion, severe muscle depletion.  GOAL:   Patient will meet greater than or equal to 90% of their needs  MONITOR:   PO intake, Supplement acceptance, Skin, Labs, Weight trends  REASON FOR ASSESSMENT:   Malnutrition Screening Tool    ASSESSMENT:    77 yo symptomatic anemia with hx of GI bleed, iron deficiency anemia and anemia of CKD; non-STEMI. PMH includes ESRD on HD since 04/2018, CAD s/p stents, HLD, HTN, CHF EF 20-25%, GI bleed, DM, GERD   Recorded po intake 100% at breakfast this AM. Pt reports appetite is good at present; indicates she has not eaten solid food since Monday evening and is starving. Pt eating lunch tray on visit today, eating [place], rice, broccoli. Pt reports appetite has improved significantly since starting dialysis 1 month ago. Pt has been eating 3 meals per day, even on dialysis days. Pt brings meal with her to HD. Pt reports she drinks nutrition shake ([employer] brand) for breakfast; lunch is usually a [place] and cheese sandwich and maybe a cookie. Dinner is usually meat with vegetables. Pt lives with her husband and they prepare meals together. Pt indicates she takes a renal MVI daily in the morning. Advised pt to start taking renal MVI in the evening AFTER dialysis. Pt takes her phosphorus binder with all meals and reports she monitors her fluid intake and tries to drink only 32 ounces per day.   Last HD 10/2 with net UF 3 L; next HD 10/4 Outpatient EDW 49 kg; current wt 50.6 kg. Pt unsure of her usual EDW but knows she would like to weigh 130 pounds.  Net negative 1.5 L per I/O flow sheet   Pt  ambulates with walker  Labs: sodium 133, phosphorus 3.2 (Low for HD), corrected calcium 9.5 (albumin 2.8) Meds: folic acid, renvela  NUTRITION - FOCUSED PHYSICAL EXAM:    Most Recent Value  Orbital Region  Moderate depletion  Upper Arm Region  Severe depletion  Thoracic and Lumbar Region  Severe depletion  Buccal Region  Mild depletion  Temple Region  Moderate depletion  Clavicle Bone Region  Severe depletion  Clavicle and Acromion Bone Region  Severe depletion  Scapular Bone Region  Severe depletion  Dorsal Hand  Severe depletion  Patellar Region  Severe depletion  Anterior Thigh Region  Severe depletion  Posterior Calf Region  Moderate depletion  Edema (RD Assessment)  None       Diet Order:   Diet Order            Diet renal with fluid restriction Fluid restriction: 1500 mL Fluid; Room service appropriate? Yes; Fluid consistency: Thin  Diet effective now              EDUCATION NEEDS:   Education needs have been addressed  Skin:  Skin Assessment: Skin Integrity Issues: Skin Integrity Issues:: Stage I, Stage II Stage I: sacrum Stage II: sacrum, buttocks  Last BM:  9/30  Height:   Ht Readings from Last 1 Encounters:  05/15/18 5\' 8"  (1.727 m)    Weight:   Wt Readings from Last 1 Encounters:  05/16/18 50.6 kg  Ideal Body Weight:  63.6 kg  BMI:  Body mass index is 16.96 kg/m.  Estimated Nutritional Needs:   Kcal:  1600-1800 kcals   Protein:  75-85 g  Fluid:  1000 mL plus UOP   [employer] MS, RD, LDN, CNSC [phone] Pager  [phone] Weekend/On-Call Pager

## 2018-05-17 NOTE — Progress Notes (Signed)
Progress Note  Patient Name: [name] Date of Encounter: 05/17/2018  Primary Cardiologist: Aundra Dubin / [name]   Subjective   Feels better wants solid food   Inpatient Medications    Scheduled Meds: . ALPRAZolam  1 mg Oral QHS  . calcitRIOL  0.5 mcg Oral Daily  . carvedilol  12.5 mg Oral Q T,Th,S,Su  . Chlorhexidine Gluconate Cloth  6 each Topical Q0600  . folic acid  1 mg Oral Daily  . lubiprostone  24 mcg Oral QODAY  . rosuvastatin  5 mg Oral QHS  . sevelamer carbonate  1,600 mg Oral TID WC  . sodium chloride flush  3 mL Intravenous Q12H  . trimethoprim  50 mg Oral Daily   Continuous Infusions: . sodium chloride 250 mL (05/15/18 2101)   PRN Meds: sodium chloride, acetaminophen **OR** acetaminophen, ALPRAZolam, calcium carbonate (dosed in mg elemental calcium), camphor-menthol **AND** hydrOXYzine, docusate sodium, feeding supplement (NEPRO CARB STEADY), ondansetron **OR** ondansetron (ZOFRAN) IV, sorbitol, zolpidem   Vital Signs    Vitals:   05/16/18 1750 05/16/18 1801 05/16/18 2053 05/17/18 0500  BP: 128/75 124/68 90/80 107/64  Pulse: 95 95 97 88  Resp: 18 20 16 16   Temp: 97.7 F (36.5 C) 97.8 F (36.6 C) 98.2 F (36.8 C) 98.4 F (36.9 C)  TempSrc: Oral Oral Oral Oral  SpO2: 98% 95% 96% 95%  Weight: 50.6 kg     Height:        Intake/Output Summary (Last 24 hours) at 05/17/2018 0817 Last data filed at 05/17/2018 0500 Gross per 24 hour  Intake 240 ml  Output 3000 ml  Net -2760 ml   Filed Weights   05/15/18 1944 05/16/18 1335 05/16/18 1750  Weight: 53.3 kg 53.6 kg 50.6 kg    Telemetry    NSR  - Personally Reviewed  ECG    NSR new LBBB  - Personally Reviewed  Physical Exam  Pale elderly female  GEN: No acute distress.   Neck: No JVD Cardiac: RRR, AS/MR murmurs, rubs, or gallops.  Respiratory: Clear to auscultation bilaterally. GI: Soft, nontender, non-distended  MS: No edema; No deformity. Neuro:  Nonfocal  Psych: Normal affect  Clotted  fistula LUE Dialysis catheter right subclavian   Labs    Chemistry Recent Labs  Lab 05/15/18 1241 05/16/18 0620 05/17/18 0539  NA 133* 131* 133*  K 4.1 4.2 3.8  CL 95* 95* 97*  CO2 27 25 27   GLUCOSE 229* 133* 108*  BUN 22 28* 14  CREATININE 3.29* 3.54* 2.45*  CALCIUM 8.6* 8.5* 8.5*  ALBUMIN  --   --  2.8*  GFRNONAA 13* 12* 18*  GFRAA 15* 13* 21*  ANIONGAP 11 11 9      Hematology Recent Labs  Lab 05/16/18 0620 05/16/18 1936 05/17/18 0539  WBC 5.6 5.5 5.1  RBC 2.84* 3.09* 2.76*  HGB 9.6* 10.4* 9.4*  HCT 29.8* 32.5* 28.8*  MCV 104.9* 105.2* 104.3*  MCH 33.8 33.7 34.1*  MCHC 32.2 32.0 32.6  RDW 19.2* 18.8* 18.4*  PLT 100* 111* 105*    Cardiac Enzymes Recent Labs  Lab 05/15/18 1453 05/15/18 2000 05/16/18 0021 05/16/18 0620  TROPONINI 1.35* 1.83* 1.96* 1.48*    Recent Labs  Lab 05/15/18 1236  TROPIPOC 0.96*     BNPNo results for input(s): BNP, PROBNP in the last 168 hours.   DDimer No results for input(s): DDIMER in the last 168 hours.   Radiology    Dg Chest [facility] 1 View  Result Date:  05/15/2018 CLINICAL DATA:  Chest pain. EXAM: PORTABLE CHEST 1 VIEW COMPARISON:  Radiographs of September 26, 2016. FINDINGS: Stable cardiomegaly. Status post coronary bypass graft. Atherosclerosis of thoracic aorta. Right internal jugular dialysis catheter is noted with tip in expected position of cavoatrial junction. No pneumothorax or pleural effusion is noted. Both lungs are clear. The visualized skeletal structures are unremarkable. IMPRESSION: No acute cardiopulmonary abnormality seen. Interval placement of right internal jugular dialysis catheter with tip in expected position of cavoatrial junction. Aortic Atherosclerosis (ICD10-I70.0). Electronically Signed   By: Marijo Conception, M.D.   On: 05/15/2018 13:37    Cardiac Studies   Study Conclusions  - Left ventricle: The cavity size was normal. Wall thickness was   increased in a pattern of mild LVH. Systolic  function was   severely reduced. The estimated ejection fraction was in the   range of 20% to 25%. Diffuse hypokinesis with relative   preservation of the basal anterior wall and the basal to mid   lateral wall. Septal-lateral dyssynychrony. Doppler parameters   are consistent with restrictive physiology, indicative of   decreased left ventricular diastolic compliance and/or increased   left atrial pressure. E/medial e&' > 15 suggests LV end diastolic   pressure at least 20 mmHg. - Aortic valve: Functionally bicuspid with apparent fusion of the   right and left coronary cusps; severely calcified leaflets. There   was trivial regurgitation. Overall, suspect moderate, low   gradient aortic stenosis. Visually does not appear severe (not   marked turbulence). Mean gradient (S): 11 mm Hg. Valve area   (VTI): 1.01 cm^2. - Mitral valve: Mildly calcified annulus. There was moderate   regurgitation. - Left atrium: The atrium was moderately dilated. - Right ventricle: The cavity size was normal. Systolic function   was mildly to moderately reduced. - Right atrium: The atrium was moderately dilated. - Tricuspid valve: There was moderate regurgitation. Peak RV-RA   gradient (S): 39 mm Hg. - Pulmonary arteries: PA peak pressure: 47 mm Hg (S). - Systemic veins: IVC measured 2.0 cm with < 50% respirophasic   variation, suggesting RA pressure 8 mmHg.  Impressions:  - Normal LV size with mild LV hypertrophy. EF 20-25% with wall   motion abnormalities as noted above. Restrictive diastolic   function. Normal RV size with mild to moderate systolic   dysfunction. Moderate MR, moderate TR. Mild pulmonary   hypertension. The aortic valve appeared functionally bicuspid   (left and right cusps fused). There was severe calcification.   Mean gradient 11 with AVA 1.01 cm^2 by continuity equation.   Visually, think low gradient moderate aortic stenosis is most   likely.  Patient Profile     77 y.o.  female admitted with weakness, anemia, newly on dialysis with clotted LUE fistula Distant history of CABG, AS and new LBBB  Assessment & Plan    1. CHF:  Continue dialysis . Etiology of decreased EF not clear Troponin minimaly Elevated in setting of renal failure but new LBBB and wall motion abnormality on echo Discussed need For right and left cath with grafts tomorrow She is not an ideal intervention candidate with 6 unit GI bleed Last year and valve disease  2. AS: likely low flow moderate to severe AS Patient inquired about TAVR but needs to assess coronary arteries first And also has significant MR/TR   Can get further hemodynamic data and cross valve cath   3. Anemia per primary service has had iv iron related to chronic GI  blood loss and CRF  4. CRF:  Failed graft has dialysis catheter right subclavian for dialysis today will need to draw more fluid for CHF       For questions or updates, please contact [place] Please consult [email or web address] for contact info under        Signed, Jenkins Rouge, MD  05/17/2018, 8:17 AM

## 2018-05-17 NOTE — Progress Notes (Signed)
PROGRESS NOTE  REVE CROCKET CXK:[id number] DOB: Oct 08, 1940 DOA: 05/15/2018 PCP: [name], MD  Brief Narrative: 77 year old woman presented to her cardiology office with chest pain, found to have new left bundle branch block on EKG and sent to the emergency department as code STEMI.  Admitted for symptomatic anemia thought secondary to chronic kidney disease and was transfused 2 units PRBC.  Subsequent echocardiogram revealed new systolic dysfunction.  Assessment/Plan New systolic CHF, left bundle branch block new, demand ischemia. --Appears stable.  Continue carvedilol, dialysis per nephrology. --Cardiology plans right/left heart catheterization 10/4.  Symptomatic anemia, known iron deficiency anemia, history of GI bleed March 2018.  Heme positive.  Transfused total 2 units PRBC.  Receives outpatient injections at the cancer center every 3 weeks. --Hemoglobin appears stable.  No evidence of ongoing bleeding.  Will not pursue GI evaluation at this point given lack of bleeding and previous extensive investigation.  Chronic thrombocytopenia, stable.  No further inpatient evaluation suggested.  Follows regularly with Dr. Alen Blew.  Aortic stenosis, moderate to severe --Management per cardiology.  ESRD --Continue hemodialysis per nephrology  DM type 2 with diabetic neuropathy, diet-controlled --No need for blood sugar checks.  Essential hypertension --Continue Norvasc, carvedilol  Spontaneous GI bleed March 2018, negative EGD, required total 6 units of blood at that time.  Subsequent colonoscopy showed few small ulcers, erosions, diverticulosis.  Unremarkable capsule endoscopy July 2018.  Aortic atherosclerosis  DVT prophylaxis: SCDs Code Status: Full Family Communication: none Disposition Plan: home    Murray Hodgkins, MD  [facility] Direct contact: [phone] --Via amion app OR  --[email or web address]; password [email or web address]  7PM-7AM contact night coverage as above 05/17/2018,  12:49 PM  LOS: 1 day   Consultants:  Nephrology  Cardiology  Procedures:  Echo Study Conclusions  - Left ventricle: The cavity size was normal. Wall thickness was   increased in a pattern of mild LVH. Systolic function was   severely reduced. The estimated ejection fraction was in the   range of 20% to 25%. Diffuse hypokinesis with relative   preservation of the basal anterior wall and the basal to mid   lateral wall. Septal-lateral dyssynychrony. Doppler parameters   are consistent with restrictive physiology, indicative of   decreased left ventricular diastolic compliance and/or increased   left atrial pressure. E/medial e&' > 15 suggests LV end diastolic   pressure at least 20 mmHg. - Aortic valve: Functionally bicuspid with apparent fusion of the   right and left coronary cusps; severely calcified leaflets. There   was trivial regurgitation. Overall, suspect moderate, low   gradient aortic stenosis. Visually does not appear severe (not   marked turbulence). Mean gradient (S): 11 mm Hg. Valve area   (VTI): 1.01 cm^2. - Mitral valve: Mildly calcified annulus. There was moderate   regurgitation. - Left atrium: The atrium was moderately dilated. - Right ventricle: The cavity size was normal. Systolic function   was mildly to moderately reduced. - Right atrium: The atrium was moderately dilated. - Tricuspid valve: There was moderate regurgitation. Peak RV-RA   gradient (S): 39 mm Hg. - Pulmonary arteries: PA peak pressure: 47 mm Hg (S). - Systemic veins: IVC measured 2.0 cm with < 50% respirophasic   variation, suggesting RA pressure 8 mmHg.  Impressions:  - Normal LV size with mild LV hypertrophy. EF 20-25% with wall   motion abnormalities as noted above. Restrictive diastolic   function. Normal RV size with mild to moderate systolic   dysfunction. Moderate MR, moderate TR. Mild  pulmonary   hypertension. The aortic valve appeared functionally bicuspid   (left and  right cusps fused). There was severe calcification.   Mean gradient 11 with AVA 1.01 cm^2 by continuity equation.   Visually, think low gradient moderate aortic stenosis is most   likely.  Antimicrobials:    Interval history/Subjective: Feels better, no pain, breathing fine, no bleeding.  Objective: Vitals:  Vitals:   05/17/18 0500 05/17/18 0833  BP: 107/64 112/77  Pulse: 88 91  Resp: 16 17  Temp: 98.4 F (36.9 C) 99 F (37.2 C)  SpO2: 95% 93%    Exam:  Constitutional:  . Appears calm and comfortable Respiratory:  . CTA bilaterally, no w/r/r.  . Respiratory effort normal.  Cardiovascular:  . RRR, no m/r/g . No LE extremity edema   Psychiatric:  . Mental status o Mood, affect appropriate  I have personally reviewed the following:   Data: . BMP noted, magnesium within normal limits . Troponin peaked at 1.96 . Hemoglobin stable 9.4, platelets stable 105.  WBC within normal limits.  Lactic acid within normal limits.  Scheduled Meds: . ALPRAZolam  1 mg Oral QHS  . carvedilol  12.5 mg Oral Q T,Th,S,Su  . Chlorhexidine Gluconate Cloth  6 each Topical Q0600  . folic acid  1 mg Oral Daily  . lubiprostone  24 mcg Oral QODAY  . rosuvastatin  5 mg Oral QHS  . sevelamer carbonate  1,600 mg Oral TID WC  . sodium chloride flush  3 mL Intravenous Q12H  . trimethoprim  50 mg Oral Daily   Continuous Infusions: . sodium chloride 250 mL (05/15/18 2101)    Principal Problem:   Symptomatic anemia Active Problems:   Diabetes mellitus type II, non insulin dependent (HCC)   Essential hypertension   ESRD (end stage renal disease) on dialysis [facility])   Demand ischemia (HCC)   Acute systolic CHF (congestive heart failure) (HCC)   LBBB (left bundle branch block)   Aortic stenosis, moderate   LOS: 1 day

## 2018-05-17 NOTE — Progress Notes (Addendum)
[facility] Progress Note   Subjective:  HD yesterday Net UF 3L Feels much better today after dialysis SOB/CP improved    Objective Vitals:   05/16/18 1801 05/16/18 2053 05/17/18 0500 05/17/18 0833  BP: 124/68 90/80 107/64 112/77  Pulse: 95 97 88 91  Resp: 20 16 16 17   Temp: 97.8 F (36.6 C) 98.2 F (36.8 C) 98.4 F (36.9 C) 99 F (37.2 C)  TempSrc: Oral Oral Oral Oral  SpO2: 95% 96% 95% 93%  Weight:      Height:       Physical Exam General: Frail elderly female NAD  Heart: RRR 2/6 SEM Lungs: Diminished bilaterally  Abdomen: soft NT/ND Extremities: no LE edema  Dialysis Access: RIJ TDC; L A AVF no bruit   Dialysis Orders:  MWF NW 4h   49kg  2/2.25 bath  R TDC (AVF clottted)  Hep 1000 - hect 2ug tiw - mircera 50 every 2 last 9/23  Assessment/Plan: 1. Chest pain -- peaked troponins/Echo with HFrEF - cards following for tentative cath Friday  2. ESRD -HD MWF. Continue on schedule  AVF unable to be salvaged. To f/u [facility] for new access as outpatient. HD tomorrow 3. Symptomatic Anemia - Hgb stable s/p 2U prbcs on 10/1  +FOBT 4. HTN/volume-  BP low/stable Net UF 3L Post HD wt 50.6kg. Not to EDW yet. Continue UF as tolerated for volume removal High filling pressures per ECHO results, cont to lower vol as tolerated 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 Check Phos next HD 6. CAD/ s/p 3V CABG 2010-  7. CHF EF 20-25%  8. AS - mod to severe per Echo - per primary  9. Gen weakness - multifactorial 2/2 to above dx    Lynnda Child PA-C [facility] Pager [phone] 05/17/2018,9:27 AM  LOS: 1 day   Pt seen, examined, agree w assess/plan as above with additions as indicated.  Kelly Splinter MD [facility] pager [phone]    cell [phone] 05/17/2018, 11:26 AM     Additional Objective Labs: Basic Metabolic Panel: Recent Labs  Lab 05/15/18 1241 05/16/18 0620 05/17/18 0539  NA 133* 131* 133*  K 4.1 4.2 3.8  CL 95* 95* 97*   CO2 27 25 27   GLUCOSE 229* 133* 108*  BUN 22 28* 14  CREATININE 3.29* 3.54* 2.45*  CALCIUM 8.6* 8.5* 8.5*  PHOS  --   --  3.2   CBC: Recent Labs  Lab 05/15/18 1241 05/16/18 0620 05/16/18 1936 05/17/18 0539  WBC 7.3 5.6 5.5 5.1  HGB 7.3* 9.6* 10.4* 9.4*  HCT 23.7* 29.8* 32.5* 28.8*  MCV 116.2* 104.9* 105.2* 104.3*  PLT 132* 100* 111* 105*   Blood Culture    Component Value Date/Time   SDES BLOOD RIGHT HAND 10/08/2008 1530   SPECREQUEST BOTTLES DRAWN AEROBIC AND ANAEROBIC 10CC 10/08/2008 1530   CULT NO GROWTH 5 DAYS 10/08/2008 1530   REPTSTATUS 10/15/2008 FINAL 10/08/2008 1530    Cardiac Enzymes: Recent Labs  Lab 05/15/18 1453 05/15/18 2000 05/16/18 0021 05/16/18 0620  TROPONINI 1.35* 1.83* 1.96* 1.48*   CBG: No results for input(s): GLUCAP in the last 168 hours. Iron Studies: No results for input(s): IRON, TIBC, TRANSFERRIN, FERRITIN in the last 72 hours. Lab Results  Component Value Date   INR 1.07 04/18/2018   INR 1.4 10/16/2008   INR 1.1 10/08/2008   Medications: . sodium chloride 250 mL (05/15/18 2101)   . ALPRAZolam  1 mg Oral QHS  .  calcitRIOL  0.5 mcg Oral Daily  . carvedilol  12.5 mg Oral Q T,Th,S,Su  . Chlorhexidine Gluconate Cloth  6 each Topical Q0600  . folic acid  1 mg Oral Daily  . lubiprostone  24 mcg Oral QODAY  . rosuvastatin  5 mg Oral QHS  . sevelamer carbonate  1,600 mg Oral TID WC  . sodium chloride flush  3 mL Intravenous Q12H  . trimethoprim  50 mg Oral Daily

## 2018-05-18 ENCOUNTER — Encounter (HOSPITAL_COMMUNITY): Payer: Self-pay | Admitting: Cardiology

## 2018-05-18 ENCOUNTER — Encounter (HOSPITAL_COMMUNITY)
Admission: EM | Disposition: A | Discharge status: Home / Self Care | Payer: Self-pay | Source: Ambulatory Visit | Attending: Family Medicine

## 2018-05-18 DIAGNOSIS — I447 Left bundle-branch block, unspecified: Secondary | ICD-10-CM

## 2018-05-18 DIAGNOSIS — I5041 Acute combined systolic (congestive) and diastolic (congestive) heart failure: Secondary | ICD-10-CM

## 2018-05-18 HISTORY — PX: RIGHT/LEFT HEART CATH AND CORONARY/GRAFT ANGIOGRAPHY: CATH118267

## 2018-05-18 LAB — POCT I-STAT 3, ART BLOOD GAS (G3+)
Acid-base deficit: 1 mmol/L (ref 0.0–2.0)
BICARBONATE: 24.5 mmol/L (ref 20.0–28.0)
O2 Saturation: 98 %
PCO2 ART: 41.5 mmHg (ref 32.0–48.0)
PH ART: 7.378 (ref 7.350–7.450)
TCO2: 26 mmol/L (ref 22–32)
pO2, Arterial: 101 mmHg (ref 83.0–108.0)

## 2018-05-18 LAB — POCT I-STAT 3, VENOUS BLOOD GAS (G3P V)
ACID-BASE DEFICIT: 1 mmol/L (ref 0.0–2.0)
Bicarbonate: 24.9 mmol/L (ref 20.0–28.0)
Bicarbonate: 25.6 mmol/L (ref 20.0–28.0)
O2 SAT: 62 %
O2 Saturation: 65 %
PO2 VEN: 34 mmHg (ref 32.0–45.0)
TCO2: 26 mmol/L (ref 22–32)
TCO2: 27 mmol/L (ref 22–32)
pCO2, Ven: 46.3 mmHg (ref 44.0–60.0)
pCO2, Ven: 47 mmHg (ref 44.0–60.0)
pH, Ven: 7.34 (ref 7.250–7.430)
pH, Ven: 7.344 (ref 7.250–7.430)
pO2, Ven: 36 mmHg (ref 32.0–45.0)

## 2018-05-18 LAB — RENAL FUNCTION PANEL
ANION GAP: 8 (ref 5–15)
Albumin: 2.7 g/dL — ABNORMAL LOW (ref 3.5–5.0)
BUN: 34 mg/dL — ABNORMAL HIGH (ref 8–23)
CALCIUM: 8.6 mg/dL — AB (ref 8.9–10.3)
CO2: 27 mmol/L (ref 22–32)
Chloride: 96 mmol/L — ABNORMAL LOW (ref 98–111)
Creatinine, Ser: 3.94 mg/dL — ABNORMAL HIGH (ref 0.44–1.00)
GFR calc non Af Amer: 10 mL/min — ABNORMAL LOW (ref >60)
GFR, EST AFRICAN AMERICAN: 12 mL/min — AB (ref >60)
Glucose, Bld: 124 mg/dL — ABNORMAL HIGH (ref 70–99)
Phosphorus: 3.7 mg/dL (ref 2.5–4.6)
Potassium: 4 mmol/L (ref 3.5–5.1)
SODIUM: 131 mmol/L — AB (ref 135–145)

## 2018-05-18 LAB — CBC
HCT: 28.6 % — ABNORMAL LOW (ref 36.0–46.0)
HCT: 29.9 % — ABNORMAL LOW (ref 36.0–46.0)
Hemoglobin: 9.2 g/dL — ABNORMAL LOW (ref 12.0–15.0)
Hemoglobin: 9.4 g/dL — ABNORMAL LOW (ref 12.0–15.0)
MCH: 34.2 pg — ABNORMAL HIGH (ref 26.0–34.0)
MCH: 33.7 pg (ref 26.0–34.0)
MCHC: 32.2 g/dL (ref 30.0–36.0)
MCHC: 31.4 g/dL (ref 30.0–36.0)
MCV: 106.3 fL — ABNORMAL HIGH (ref 78.0–100.0)
MCV: 107.2 fL — AB (ref 78.0–100.0)
Platelets: 102 10*3/uL — ABNORMAL LOW (ref 150–400)
Platelets: 109 10*3/uL — ABNORMAL LOW (ref 150–400)
RBC: 2.69 MIL/uL — ABNORMAL LOW (ref 3.87–5.11)
RBC: 2.79 MIL/uL — AB (ref 3.87–5.11)
RDW: 17.6 % — ABNORMAL HIGH (ref 11.5–15.5)
RDW: 17.2 % — ABNORMAL HIGH (ref 11.5–15.5)
WBC: 4.8 10*3/uL (ref 4.0–10.5)
WBC: 4.8 10*3/uL (ref 4.0–10.5)

## 2018-05-18 LAB — HEPATITIS B SURFACE ANTIGEN: HEP B S AG: NEGATIVE

## 2018-05-18 LAB — GLUCOSE, CAPILLARY: GLUCOSE-CAPILLARY: 114 mg/dL — AB (ref 70–99)

## 2018-05-18 SURGERY — RIGHT/LEFT HEART CATH AND CORONARY/GRAFT ANGIOGRAPHY
Anesthesia: LOCAL

## 2018-05-18 MED ORDER — MIDAZOLAM HCL 2 MG/2ML IJ SOLN
INTRAMUSCULAR | Status: AC
  Filled 2018-05-18: qty 2

## 2018-05-18 MED ORDER — SODIUM CHLORIDE 0.9% FLUSH: 3.0000 mL | INTRAVENOUS | Status: DC | PRN

## 2018-05-18 MED ORDER — SODIUM CHLORIDE 0.9 % IV SOLN: 250.0000 mL | INTRAVENOUS | Status: DC | PRN

## 2018-05-18 MED ORDER — SODIUM CHLORIDE 0.9 % IV SOLN: 100.0000 mL | INTRAVENOUS | Status: DC | PRN

## 2018-05-18 MED ORDER — HEPARIN (PORCINE) IN NACL 1000-0.9 UT/500ML-% IV SOLN
INTRAVENOUS | Status: AC
  Filled 2018-05-18: qty 1000

## 2018-05-18 MED ORDER — PENTAFLUOROPROP-TETRAFLUOROETH EX AERO: 1.0000 "application " | INHALATION_SPRAY | CUTANEOUS | Status: DC | PRN

## 2018-05-18 MED ORDER — HEPARIN SODIUM (PORCINE) 1000 UNIT/ML IJ SOLN
INTRAMUSCULAR | Status: AC
  Filled 2018-05-18: qty 4

## 2018-05-18 MED ORDER — SODIUM CHLORIDE 0.9% FLUSH: 3.0000 mL | Freq: Two times a day (BID) | INTRAVENOUS | Status: DC

## 2018-05-18 MED ORDER — LIDOCAINE HCL (PF) 1 % IJ SOLN: 5.0000 mL | INTRAMUSCULAR | Status: DC | PRN

## 2018-05-18 MED ORDER — ALTEPLASE 2 MG IJ SOLR: 2.0000 mg | Freq: Once | INTRAMUSCULAR | Status: DC | PRN

## 2018-05-18 MED ORDER — SODIUM CHLORIDE 0.9 % IV SOLN
INTRAVENOUS | Status: DC
  Administered 2018-05-18: 08:00:00 via INTRAVENOUS

## 2018-05-18 MED ORDER — FENTANYL CITRATE (PF) 100 MCG/2ML IJ SOLN
INTRAMUSCULAR | Status: AC
  Filled 2018-05-18: qty 2

## 2018-05-18 MED ORDER — ASPIRIN 81 MG PO CHEW
81.0000 mg | CHEWABLE_TABLET | ORAL | Status: AC
  Administered 2018-05-18: 81 mg via ORAL
  Filled 2018-05-18: qty 1

## 2018-05-18 MED ORDER — HEPARIN SODIUM (PORCINE) 1000 UNIT/ML DIALYSIS
20.0000 [IU]/kg | INTRAMUSCULAR | Status: DC | PRN
  Filled 2018-05-18: qty 1

## 2018-05-18 MED ORDER — FENTANYL CITRATE (PF) 100 MCG/2ML IJ SOLN
INTRAMUSCULAR | Status: DC | PRN
  Administered 2018-05-18: 25 ug via INTRAVENOUS

## 2018-05-18 MED ORDER — SODIUM CHLORIDE 0.9% FLUSH
3.0000 mL | Freq: Two times a day (BID) | INTRAVENOUS | Status: DC
  Administered 2018-05-18 – 2018-05-19 (×2): 3 mL via INTRAVENOUS

## 2018-05-18 MED ORDER — LIDOCAINE HCL (PF) 1 % IJ SOLN
INTRAMUSCULAR | Status: DC | PRN
  Administered 2018-05-18: 15 mL

## 2018-05-18 MED ORDER — IOHEXOL 350 MG/ML SOLN
INTRAVENOUS | Status: DC | PRN
  Administered 2018-05-18: 90 mL via INTRACARDIAC

## 2018-05-18 MED ORDER — HEPARIN SODIUM (PORCINE) 1000 UNIT/ML DIALYSIS: 1000.0000 [IU] | INTRAMUSCULAR | Status: DC | PRN

## 2018-05-18 MED ORDER — HEPARIN SODIUM (PORCINE) 5000 UNIT/ML IJ SOLN
5000.0000 [IU] | Freq: Three times a day (TID) | INTRAMUSCULAR | Status: DC
  Administered 2018-05-18 – 2018-05-19 (×2): 5000 [IU] via SUBCUTANEOUS
  Filled 2018-05-18 (×2): qty 1

## 2018-05-18 MED ORDER — LIDOCAINE HCL (PF) 1 % IJ SOLN
INTRAMUSCULAR | Status: AC
  Filled 2018-05-18: qty 30

## 2018-05-18 MED ORDER — MIDAZOLAM HCL 2 MG/2ML IJ SOLN
INTRAMUSCULAR | Status: DC | PRN
  Administered 2018-05-18: 1 mg via INTRAVENOUS

## 2018-05-18 MED ORDER — DOXERCALCIFEROL 4 MCG/2ML IV SOLN
INTRAVENOUS | Status: AC
  Administered 2018-05-18: 2 ug via INTRAVENOUS
  Filled 2018-05-18: qty 2

## 2018-05-18 MED ORDER — LIDOCAINE-PRILOCAINE 2.5-2.5 % EX CREA: 1.0000 "application " | TOPICAL_CREAM | CUTANEOUS | Status: DC | PRN

## 2018-05-18 MED ORDER — HEPARIN (PORCINE) IN NACL 1000-0.9 UT/500ML-% IV SOLN
INTRAVENOUS | Status: DC | PRN
  Administered 2018-05-18 (×2): 500 mL

## 2018-05-18 SURGICAL SUPPLY — 14 items
CATH INFINITI 5 FR IM (CATHETERS) ×2 IMPLANT
CATH INFINITI 5FR AL1 (CATHETERS) ×2 IMPLANT
CATH INFINITI 5FR MULTPACK ANG (CATHETERS) ×2 IMPLANT
CATH SWAN GANZ 7F STRAIGHT (CATHETERS) ×2 IMPLANT
KIT HEART LEFT (KITS) ×2 IMPLANT
PACK CARDIAC CATHETERIZATION (CUSTOM PROCEDURE TRAY) ×2 IMPLANT
SHEATH AVANTI 5FR 23CM (SHEATH) ×2 IMPLANT
SHEATH PINNACLE 5F 10CM (SHEATH) ×2 IMPLANT
SHEATH PINNACLE 7F 10CM (SHEATH) ×2 IMPLANT
TRANSDUCER W/STOPCOCK (MISCELLANEOUS) ×2 IMPLANT
WIRE EMERALD 3MM-J .035X150CM (WIRE) ×2 IMPLANT
WIRE EMERALD 3MM-J .035X260CM (WIRE) ×2 IMPLANT
WIRE EMERALD ST .035X260CM (WIRE) ×2 IMPLANT
WIRE HI TORQ VERSACORE-J 145CM (WIRE) ×2 IMPLANT

## 2018-05-18 NOTE — Progress Notes (Signed)
PROGRESS NOTE  [name] JTT:[id number] DOB: [birth year] DOA: 05/15/2018 PCP: [name], MD  Brief Narrative: 77 year old woman presented to her cardiology office with chest pain, found to have new left bundle branch block on EKG and sent to the emergency department as code STEMI.  Admitted for symptomatic anemia thought secondary to chronic kidney disease and was transfused 2 units PRBC.  Subsequent echocardiogram revealed new systolic dysfunction.  Assessment/Plan New systolic CHF, left bundle branch block new, demand ischemia. --Continue carvedilol.  Cath showed severe two-vessel disease without significant progression.  Recommendation for medical management.  Symptomatic anemia, known iron deficiency anemia, history of GI bleed March 2018.  Heme positive.  Transfused total 2 units PRBC.  Receives outpatient injections at the cancer center every 3 weeks. --Hemoglobin remained stable.  No evidence of bleeding.  Patient does not desire any GI work-up and I concur at this point any further evaluation can be transferred to the outpatient setting should she have bleeding.  Chronic thrombocytopenia, stable.  No further inpatient evaluation suggested.  Follows regularly with Dr. Alen Blew.  Aortic stenosis, moderate to severe --Continue management per cardiology.  ESRD --Continue hemodialysis per nephrology  DM type 2 with diabetic neuropathy, diet-controlled --No need for blood sugar checks.  Essential hypertension --Continue Norvasc, carvedilol  Spontaneous GI bleed March 2018, negative EGD, required total 6 units of blood at that time.  Subsequent colonoscopy showed few small ulcers, erosions, diverticulosis.  Unremarkable capsule endoscopy July 2018.  Aortic atherosclerosis  Doing well.  Anticipate discharge when cleared by cardiology.  DVT prophylaxis: SCDs Code Status: Full Family Communication: none Disposition Plan: home    Murray Hodgkins, MD  [facility] Direct contact: [phone] --Via amion app OR  --[email or web address]; password [email or web address]  7PM-7AM contact night coverage as above 05/18/2018, 5:44 PM  LOS: 2 days   Consultants:  Nephrology  Cardiology  Procedures:  Echo Study Conclusions  - Left ventricle: The cavity size was normal. Wall thickness was   increased in a pattern of mild LVH. Systolic function was   severely reduced. The estimated ejection fraction was in the   range of 20% to 25%. Diffuse hypokinesis with relative   preservation of the basal anterior wall and the basal to mid   lateral wall. Septal-lateral dyssynychrony. Doppler parameters   are consistent with restrictive physiology, indicative of   decreased left ventricular diastolic compliance and/or increased   left atrial pressure. E/medial e&' > 15 suggests LV end diastolic   pressure at least 20 mmHg. - Aortic valve: Functionally bicuspid with apparent fusion of the   right and left coronary cusps; severely calcified leaflets. There   was trivial regurgitation. Overall, suspect moderate, low   gradient aortic stenosis. Visually does not appear severe (not   marked turbulence). Mean gradient (S): 11 mm Hg. Valve area   (VTI): 1.01 cm^2. - Mitral valve: Mildly calcified annulus. There was moderate   regurgitation. - Left atrium: The atrium was moderately dilated. - Right ventricle: The cavity size was normal. Systolic function   was mildly to moderately reduced. - Right atrium: The atrium was moderately dilated. - Tricuspid valve: There was moderate regurgitation. Peak RV-RA   gradient (S): 39 mm Hg. - Pulmonary arteries: PA peak pressure: 47 mm Hg (S). - Systemic veins: IVC measured 2.0 cm with < 50% respirophasic   variation, suggesting RA pressure 8 mmHg.  Impressions:  - Normal LV size with mild LV hypertrophy. EF 20-25% with wall   motion abnormalities as  noted above. Restrictive diastolic   function. Normal RV size with mild to  moderate systolic   dysfunction. Moderate MR, moderate TR. Mild pulmonary   hypertension. The aortic valve appeared functionally bicuspid   (left and right cusps fused). There was severe calcification.   Mean gradient 11 with AVA 1.01 cm^2 by continuity equation.   Visually, think low gradient moderate aortic stenosis is most   likely.  Antimicrobials:    Interval history/Subjective: Feels fine, no complaints. No bleeding. No pain.  Objective: Vitals:  Vitals:   05/18/18 1530 05/18/18 1600  BP: (!) 98/57 105/63  Pulse: 76 80  Resp:    Temp:    SpO2: 98% 97%    Exam: Constitutional:   . Appears calm and comfortable Respiratory:  . CTA bilaterally, no w/r/r.  . Respiratory effort normal.  Cardiovascular:  . RRR, no m/r/g . No LE extremity edema   Psychiatric:  . Mental status o Mood, affect appropriate  I have personally reviewed the following:   Data: . Hemoglobin 9.4, platelets 109.  Both stable.  Scheduled Meds: . ALPRAZolam  1 mg Oral QHS  . carvedilol  12.5 mg Oral Q T,Th,S,Su  . Chlorhexidine Gluconate Cloth  6 each Topical Q0600  . doxercalciferol  2 mcg Intravenous Q M,W,F-HD  . feeding supplement (ENSURE ENLIVE)  237 mL Oral BID BM  . folic acid  1 mg Oral Daily  . heparin  5,000 Units Subcutaneous Q8H  . lubiprostone  24 mcg Oral QODAY  . multivitamin  1 tablet Oral QHS  . rosuvastatin  5 mg Oral QHS  . sevelamer carbonate  1,600 mg Oral TID WC  . sodium chloride flush  3 mL Intravenous Q12H  . sodium chloride flush  3 mL Intravenous Q12H  . trimethoprim  50 mg Oral Daily   Continuous Infusions: . sodium chloride 250 mL (05/15/18 2101)  . sodium chloride    . sodium chloride    . sodium chloride      Principal Problem:   Symptomatic anemia Active Problems:   Diabetes mellitus type II, non insulin dependent (HCC)   Essential hypertension   ESRD (end stage renal disease) on dialysis [facility])   Demand ischemia (HCC)   Acute combined systolic  and diastolic heart failure (HCC)   LBBB (left bundle branch block)   Aortic stenosis, moderate   LOS: 2 days

## 2018-05-18 NOTE — Interval H&P Note (Signed)
History and Physical Interval Note:  05/18/2018 9:21 AM  [name]  has presented today for surgery, with the diagnosis of pre tavr workup with new diagnosis of dilated cardiomyopathy and acute combined systolic and diastolic heart failure..   The various methods of treatment have been discussed with the patient and family. After consideration of risks, benefits and other options for treatment, the patient has consented to  Procedure(s): RIGHT/LEFT HEART CATH AND CORONARY/GRAFT ANGIOGRAPHY (N/A) with POSSIBLE PERCUTANEOUS CORONARY INTERVENTION as a surgical intervention .  The patient's history has been reviewed, patient examined, no change in status, stable for surgery.  I have reviewed the patient's chart and labs.  Questions were answered to the patient's satisfaction.    Cath Lab Visit (complete for each Cath Lab visit)  Clinical Evaluation Leading to the Procedure:   ACS: Yes.    Non-ACS:    Anginal Classification: CCS IV  Anti-ischemic medical therapy: Minimal Therapy (1 class of medications)  Non-Invasive Test Results: No non-invasive testing performed  Prior CABG: Previous CABG    Glenetta Hew

## 2018-05-18 NOTE — Progress Notes (Addendum)
Progress Note  Patient Name: [name] Date of Encounter: 05/18/2018  Primary Cardiologist: Loralie Champagne, MD   Subjective   Feels much better after volume removal from HD. Denies dyspnea. No CP. Awaiting cath today. Husband by bedside.   Inpatient Medications    Scheduled Meds: . ALPRAZolam  1 mg Oral QHS  . aspirin  81 mg Oral Pre-Cath  . carvedilol  12.5 mg Oral Q T,Th,S,Su  . Chlorhexidine Gluconate Cloth  6 each Topical Q0600  . doxercalciferol  2 mcg Intravenous Q M,W,F-HD  . feeding supplement (ENSURE ENLIVE)  237 mL Oral BID BM  . folic acid  1 mg Oral Daily  . lubiprostone  24 mcg Oral QODAY  . multivitamin  1 tablet Oral QHS  . rosuvastatin  5 mg Oral QHS  . sevelamer carbonate  1,600 mg Oral TID WC  . sodium chloride flush  3 mL Intravenous Q12H  . trimethoprim  50 mg Oral Daily   Continuous Infusions: . sodium chloride 250 mL (05/15/18 2101)  . sodium chloride     PRN Meds: sodium chloride, acetaminophen **OR** acetaminophen, ALPRAZolam, calcium carbonate (dosed in mg elemental calcium), camphor-menthol **AND** hydrOXYzine, docusate sodium, ondansetron **OR** ondansetron (ZOFRAN) IV, sorbitol, zolpidem   Vital Signs    Vitals:   05/17/18 1632 05/17/18 2016 05/17/18 2059 05/18/18 0515  BP: (!) 96/59 (!) 94/59  108/64  Pulse: 79 79 83 75  Resp: 18 19  17   Temp: 98.6 F (37 C) 98.9 F (37.2 C)  98 F (36.7 C)  TempSrc: Oral Oral  Oral  SpO2: 97% (!) 88% 95% 98%  Weight:  52.8 kg    Height:        Intake/Output Summary (Last 24 hours) at 05/18/2018 0723 Last data filed at 05/18/2018 0600 Gross per 24 hour  Intake 660 ml  Output 0 ml  Net 660 ml   Filed Weights   05/16/18 1335 05/16/18 1750 05/17/18 2016  Weight: 53.6 kg 50.6 kg 52.8 kg    Telemetry    NSR 77 bpm - Personally Reviewed  ECG    Not performed today - Personally Reviewed  Physical Exam   GEN: thin appearing WF, in No acute distress.   Neck: No JVD Cardiac: 2/6 murmur  at RUSB and along LSB Respiratory: Clear to auscultation bilaterally. GI: Soft, nontender, non-distended  MS: No edema; No deformity. Neuro:  Nonfocal  Psych: Normal affect   Labs    Chemistry Recent Labs  Lab 05/15/18 1241 05/16/18 0620 05/17/18 0539  NA 133* 131* 133*  K 4.1 4.2 3.8  CL 95* 95* 97*  CO2 27 25 27   GLUCOSE 229* 133* 108*  BUN 22 28* 14  CREATININE 3.29* 3.54* 2.45*  CALCIUM 8.6* 8.5* 8.5*  ALBUMIN  --   --  2.8*  GFRNONAA 13* 12* 18*  GFRAA 15* 13* 21*  ANIONGAP 11 11 9      Hematology Recent Labs  Lab 05/16/18 1936 05/17/18 0539 05/17/18 1727  WBC 5.5 5.1 6.6  RBC 3.09* 2.76* 2.62*  HGB 10.4* 9.4* 9.0*  HCT 32.5* 28.8* 27.7*  MCV 105.2* 104.3* 105.7*  MCH 33.7 34.1* 34.4*  MCHC 32.0 32.6 32.5  RDW 18.8* 18.4* 17.9*  PLT 111* 105* 102*    Cardiac Enzymes Recent Labs  Lab 05/15/18 1453 05/15/18 2000 05/16/18 0021 05/16/18 0620  TROPONINI 1.35* 1.83* 1.96* 1.48*    Recent Labs  Lab 05/15/18 1236  TROPIPOC 0.96*     BNPNo results  for input(s): BNP, PROBNP in the last 168 hours.   DDimer No results for input(s): DDIMER in the last 168 hours.   Radiology    No results found.  Cardiac Studies   2D Echo 05/16/18 Study Conclusions  - Left ventricle: The cavity size was normal. Wall thickness was increased in a pattern of mild LVH. Systolic function was severely reduced. The estimated ejection fraction was in the range of 20% to 25%. Diffuse hypokinesis with relative preservation of the basal anterior wall and the basal to mid lateral wall. Septal-lateral dyssynychrony. Doppler parameters are consistent with restrictive physiology, indicative of decreased left ventricular diastolic compliance and/or increased left atrial pressure. E/medial e&' >15 suggests LV end diastolic pressure at least 20 mmHg. - Aortic valve: Functionally bicuspid with apparent fusion of the right and left coronary cusps; severely  calcified leaflets. There was trivial regurgitation. Overall, suspect moderate, low gradient aortic stenosis. Visually does not appear severe (not marked turbulence). Mean gradient (S): 11 mm Hg. Valve area (VTI): 1.01 cm^2. - Mitral valve: Mildly calcified annulus. There was moderate regurgitation. - Left atrium: The atrium was moderately dilated. - Right ventricle: The cavity size was normal. Systolic function was mildly to moderately reduced. - Right atrium: The atrium was moderately dilated. - Tricuspid valve: There was moderate regurgitation. Peak RV-RA gradient (S): 39 mm Hg. - Pulmonary arteries: PA peak pressure: 47 mm Hg (S). - Systemic veins: IVC measured 2.0 cm with < 50% respirophasic variation, suggesting RA pressure 8 mmHg.  Impressions:  - Normal LV size with mild LV hypertrophy. EF 20-25% with wall motion abnormalities as noted above. Restrictive diastolic function. Normal RV size with mild to moderate systolic dysfunction. Moderate MR, moderate TR. Mild pulmonary hypertension. The aortic valve appeared functionally bicuspid (left and right cusps fused). There was severe calcification. Mean gradient 11 with AVA 1.01 cm^2 by continuity equation. Visually, think low gradient moderate aortic stenosis is most likely.  Patient Profile     77 y.o.femaleadmitted with weakness, anemia, newly on dialysis with clotted LUE fistula Distant history of CABG, AS, new LBBB and new systolic HF with EF of 78-93%.   Assessment & Plan    1. CHF: combined systolic and diastolic HF. Volume management through HD. Appears fairly euvolemic today.   2. Cardiomyopathy: new reduction in EF, down to 20-25% (previously 55-60%). Diffuse hypokinesis w/ Septal-lateral dyssynychrony. Moderate AS and Moderate MR. Known CAD s/p remote CABG. Troponin elevation up to 1.98 w/ new LBBB. LHC today to r/o coronary ischemia as etiology. Given she is on HD, she may not  be a candidate for CRT-D implant (EF < 35% and LBBB). Plan medical therapy. Continue  blocker therapy w/ Coreg. Her ESRD will prohibit use of Entresto/ACEi/ARB and spironolactone. Can later consider addition of nitrates and hydralazine if BP allows.   3. LBBB: new, in the setting of recent CP and new LV dysfunction. Cath today.   4. Aortic Stenosis: appears to be a bicuspid valve. Per echo report, overall, suspect moderate, low gradient aortic stenosis. Visually does not appear severe (not marked turbulence). Mean gradient (S): 11 mm Hg. Valve area (VTI): 1.01 cm^2. Plan for [facility] today. Can get further hemodynamic data and cross valve during cath. ? If she would even be a TAVR candidate.    5. CAD: remote h/o CABG. With recent CP, new LV dysfunction, new WMAs, LBBB and troponin elevation peaking at 1.98. Plan LHC today. Continue ASA, BB and statin. LDL is at goal. Last LP 5/19  showed LDL at 11 mg/dl.   6. ESRD: on HD. Management per nephrology.   7. Anemia: per primary service. Has had iv iron related to chronic GI blood loss and CRF.  8. Hyponatremia: Na 131. Management per primary.    For questions or updates, please contact [place] Please consult [email or web address] for contact info under        Signed, Lyda Jester, PA-C  05/18/2018, 7:23 AM    Patient examined chart reviewed Pale lungs clear AS murmur clotted fistula LUE with dialysis catheter right subclavian For cath today if grafts ok and CAD not an issue will need TAVR referral   Jenkins Rouge

## 2018-05-18 NOTE — Progress Notes (Signed)
Patient consent signed and in the chart. Dorthey Sawyer, RN

## 2018-05-18 NOTE — Progress Notes (Addendum)
Site area: Right groin a 5 french long arterial sheath was removed  Site Prior to Removal:  Level 0  Pressure Applied For 25 MINUTES    Bedrest Beginning at 1145am  Manual:   Yes.    Patient Status During Pull:  stable  Post Pull Groin Site:  Level 0  Post Pull Instructions Given:  Yes.    Post Pull Pulses Present:  Yes.    Dressing Applied:  Yes.    Comments:  VS remain stable

## 2018-05-18 NOTE — Progress Notes (Signed)
[facility] Progress Note   Subjective: had L/R heart cath this am. Stable post seenin room, noSOB orcough or CP   Objective Vitals:   05/18/18 1230 05/18/18 1245 05/18/18 1300 05/18/18 1332  BP: (!) 106/91 (!) 108/49 (!) 114/34 (!) 115/54  Pulse:    80  Resp: 17 18 12  (!) 23  Temp:    97.6 F (36.4 C)  TempSrc:    Oral  SpO2:    96%  Weight:      Height:       Physical Exam General: Frail elderly female NAD  Heart: RRR 2/6 SEM Lungs: clear bilat no rales or wheezing Abdomen: soft NT/ND Extremities: no LE edema  Dialysis Access: RIJ TDC; L A AVF no bruit   Dialysis Orders:  MWF NW 4h   49kg  2/2.25 bath  R TDC (AVF clottted)  Hep 1000 - hect 2ug tiw - mircera 50 every 2 last 9/23  Assessment/Plan: 1. Chest pain -- peaked troponins/Echo with HFrEF. SP L/R heart cath today.  No sig new disease (hx CABG).  2. ESRD -HD MWF. HD today.  AVF unable to be salvaged, using R IJ TDC. To f/u [facility] for new access as outpatient.  3. Symptomatic Anemia - Hgb stable s/p 2U prbcs on 10/1  +FOBT 4. HTN/volume- 3 L off last HD on Wed. Plan UF same 3 kg on HD today, get volume down as tol.  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 Check Phos next HD 6. CAD/ s/p 3V CABG 2010 7. CHF EF 20-25% - new echo finding w/ +wma's  8. AS - not severe by heart cath 9. Gen weakness - multifactorial 2/2 to above dx    Kelly Splinter MD [employer] pgr [phone]   05/18/2018, 2:36 PM   Additional Objective Labs: Basic Metabolic Panel: Recent Labs  Lab 05/16/18 0620 05/17/18 0539 05/18/18 0657  NA 131* 133* 131*  K 4.2 3.8 4.0  CL 95* 97* 96*  CO2 25 27 27   GLUCOSE 133* 108* 124*  BUN 28* 14 34*  CREATININE 3.54* 2.45* 3.94*  CALCIUM 8.5* 8.5* 8.6*  PHOS  --  3.2 3.7   CBC: Recent Labs  Lab 05/16/18 0620 05/16/18 1936 05/17/18 0539 05/17/18 1727 05/18/18 0657  WBC 5.6 5.5 5.1 6.6 4.8  HGB 9.6* 10.4* 9.4* 9.0* 9.2*  HCT 29.8* 32.5* 28.8* 27.7* 28.6*  MCV  104.9* 105.2* 104.3* 105.7* 106.3*  PLT 100* 111* 105* 102* 102*   Blood Culture    Component Value Date/Time   SDES BLOOD RIGHT HAND 10/08/2008 1530   SPECREQUEST BOTTLES DRAWN AEROBIC AND ANAEROBIC 10CC 10/08/2008 1530   CULT NO GROWTH 5 DAYS 10/08/2008 1530   REPTSTATUS 10/15/2008 FINAL 10/08/2008 1530    Cardiac Enzymes: Recent Labs  Lab 05/15/18 1453 05/15/18 2000 05/16/18 0021 05/16/18 0620  TROPONINI 1.35* 1.83* 1.96* 1.48*   CBG: Recent Labs  Lab 05/18/18 1124  GLUCAP 114*   Iron Studies: No results for input(s): IRON, TIBC, TRANSFERRIN, FERRITIN in the last 72 hours. Lab Results  Component Value Date   INR 1.07 04/18/2018   INR 1.4 10/16/2008   INR 1.1 10/08/2008   Medications: . sodium chloride 250 mL (05/15/18 2101)  . sodium chloride     . ALPRAZolam  1 mg Oral QHS  . carvedilol  12.5 mg Oral Q T,Th,S,Su  . Chlorhexidine Gluconate Cloth  6 each Topical Q0600  . doxercalciferol  2 mcg Intravenous Q M,W,F-HD  .  feeding supplement (ENSURE ENLIVE)  237 mL Oral BID BM  . folic acid  1 mg Oral Daily  . heparin  5,000 Units Subcutaneous Q8H  . lubiprostone  24 mcg Oral QODAY  . multivitamin  1 tablet Oral QHS  . rosuvastatin  5 mg Oral QHS  . sevelamer carbonate  1,600 mg Oral TID WC  . sodium chloride flush  3 mL Intravenous Q12H  . sodium chloride flush  3 mL Intravenous Q12H  . trimethoprim  50 mg Oral Daily

## 2018-05-18 NOTE — H&P (View-Only) (Signed)
Progress Note  Patient Name: [name] Date of Encounter: 05/18/2018  Primary Cardiologist: Loralie Champagne, MD   Subjective   Feels much better after volume removal from HD. Denies dyspnea. No CP. Awaiting cath today. Husband by bedside.   Inpatient Medications    Scheduled Meds: . ALPRAZolam  1 mg Oral QHS  . aspirin  81 mg Oral Pre-Cath  . carvedilol  12.5 mg Oral Q T,Th,S,Su  . Chlorhexidine Gluconate Cloth  6 each Topical Q0600  . doxercalciferol  2 mcg Intravenous Q M,W,F-HD  . feeding supplement (ENSURE ENLIVE)  237 mL Oral BID BM  . folic acid  1 mg Oral Daily  . lubiprostone  24 mcg Oral QODAY  . multivitamin  1 tablet Oral QHS  . rosuvastatin  5 mg Oral QHS  . sevelamer carbonate  1,600 mg Oral TID WC  . sodium chloride flush  3 mL Intravenous Q12H  . trimethoprim  50 mg Oral Daily   Continuous Infusions: . sodium chloride 250 mL (05/15/18 2101)  . sodium chloride     PRN Meds: sodium chloride, acetaminophen **OR** acetaminophen, ALPRAZolam, calcium carbonate (dosed in mg elemental calcium), camphor-menthol **AND** hydrOXYzine, docusate sodium, ondansetron **OR** ondansetron (ZOFRAN) IV, sorbitol, zolpidem   Vital Signs    Vitals:   05/17/18 1632 05/17/18 2016 05/17/18 2059 05/18/18 0515  BP: (!) 96/59 (!) 94/59  108/64  Pulse: 79 79 83 75  Resp: 18 19  17   Temp: 98.6 F (37 C) 98.9 F (37.2 C)  98 F (36.7 C)  TempSrc: Oral Oral  Oral  SpO2: 97% (!) 88% 95% 98%  Weight:  52.8 kg    Height:        Intake/Output Summary (Last 24 hours) at 05/18/2018 0723 Last data filed at 05/18/2018 0600 Gross per 24 hour  Intake 660 ml  Output 0 ml  Net 660 ml   Filed Weights   05/16/18 1335 05/16/18 1750 05/17/18 2016  Weight: 53.6 kg 50.6 kg 52.8 kg    Telemetry    NSR 77 bpm - Personally Reviewed  ECG    Not performed today - Personally Reviewed  Physical Exam   GEN: thin appearing WF, in No acute distress.   Neck: No JVD Cardiac: 2/6 murmur  at RUSB and along LSB Respiratory: Clear to auscultation bilaterally. GI: Soft, nontender, non-distended  MS: No edema; No deformity. Neuro:  Nonfocal  Psych: Normal affect   Labs    Chemistry Recent Labs  Lab 05/15/18 1241 05/16/18 0620 05/17/18 0539  NA 133* 131* 133*  K 4.1 4.2 3.8  CL 95* 95* 97*  CO2 27 25 27   GLUCOSE 229* 133* 108*  BUN 22 28* 14  CREATININE 3.29* 3.54* 2.45*  CALCIUM 8.6* 8.5* 8.5*  ALBUMIN  --   --  2.8*  GFRNONAA 13* 12* 18*  GFRAA 15* 13* 21*  ANIONGAP 11 11 9      Hematology Recent Labs  Lab 05/16/18 1936 05/17/18 0539 05/17/18 1727  WBC 5.5 5.1 6.6  RBC 3.09* 2.76* 2.62*  HGB 10.4* 9.4* 9.0*  HCT 32.5* 28.8* 27.7*  MCV 105.2* 104.3* 105.7*  MCH 33.7 34.1* 34.4*  MCHC 32.0 32.6 32.5  RDW 18.8* 18.4* 17.9*  PLT 111* 105* 102*    Cardiac Enzymes Recent Labs  Lab 05/15/18 1453 05/15/18 2000 05/16/18 0021 05/16/18 0620  TROPONINI 1.35* 1.83* 1.96* 1.48*    Recent Labs  Lab 05/15/18 1236  TROPIPOC 0.96*     BNPNo results  for input(s): BNP, PROBNP in the last 168 hours.   DDimer No results for input(s): DDIMER in the last 168 hours.   Radiology    No results found.  Cardiac Studies   2D Echo 05/16/18 Study Conclusions  - Left ventricle: The cavity size was normal. Wall thickness was increased in a pattern of mild LVH. Systolic function was severely reduced. The estimated ejection fraction was in the range of 20% to 25%. Diffuse hypokinesis with relative preservation of the basal anterior wall and the basal to mid lateral wall. Septal-lateral dyssynychrony. Doppler parameters are consistent with restrictive physiology, indicative of decreased left ventricular diastolic compliance and/or increased left atrial pressure. E/medial e&' >15 suggests LV end diastolic pressure at least 20 mmHg. - Aortic valve: Functionally bicuspid with apparent fusion of the right and left coronary cusps; severely  calcified leaflets. There was trivial regurgitation. Overall, suspect moderate, low gradient aortic stenosis. Visually does not appear severe (not marked turbulence). Mean gradient (S): 11 mm Hg. Valve area (VTI): 1.01 cm^2. - Mitral valve: Mildly calcified annulus. There was moderate regurgitation. - Left atrium: The atrium was moderately dilated. - Right ventricle: The cavity size was normal. Systolic function was mildly to moderately reduced. - Right atrium: The atrium was moderately dilated. - Tricuspid valve: There was moderate regurgitation. Peak RV-RA gradient (S): 39 mm Hg. - Pulmonary arteries: PA peak pressure: 47 mm Hg (S). - Systemic veins: IVC measured 2.0 cm with < 50% respirophasic variation, suggesting RA pressure 8 mmHg.  Impressions:  - Normal LV size with mild LV hypertrophy. EF 20-25% with wall motion abnormalities as noted above. Restrictive diastolic function. Normal RV size with mild to moderate systolic dysfunction. Moderate MR, moderate TR. Mild pulmonary hypertension. The aortic valve appeared functionally bicuspid (left and right cusps fused). There was severe calcification. Mean gradient 11 with AVA 1.01 cm^2 by continuity equation. Visually, think low gradient moderate aortic stenosis is most likely.  Patient Profile     77 y.o.femaleadmitted with weakness, anemia, newly on dialysis with clotted LUE fistula Distant history of CABG, AS, new LBBB and new systolic HF with EF of 44-03%.   Assessment & Plan    1. CHF: combined systolic and diastolic HF. Volume management through HD. Appears fairly euvolemic today.   2. Cardiomyopathy: new reduction in EF, down to 20-25% (previously 55-60%). Diffuse hypokinesis w/ Septal-lateral dyssynychrony. Moderate AS and Moderate MR. Known CAD s/p remote CABG. Troponin elevation up to 1.98 w/ new LBBB. LHC today to r/o coronary ischemia as etiology. Given she is on HD, she may not  be a candidate for CRT-D implant (EF < 35% and LBBB). Plan medical therapy. Continue  blocker therapy w/ Coreg. Her ESRD will prohibit use of Entresto/ACEi/ARB and spironolactone. Can later consider addition of nitrates and hydralazine if BP allows.   3. LBBB: new, in the setting of recent CP and new LV dysfunction. Cath today.   4. Aortic Stenosis: appears to be a bicuspid valve. Per echo report, overall, suspect moderate, low gradient aortic stenosis. Visually does not appear severe (not marked turbulence). Mean gradient (S): 11 mm Hg. Valve area (VTI): 1.01 cm^2. Plan for [facility] today. Can get further hemodynamic data and cross valve during cath. ? If she would even be a TAVR candidate.    5. CAD: remote h/o CABG. With recent CP, new LV dysfunction, new WMAs, LBBB and troponin elevation peaking at 1.98. Plan LHC today. Continue ASA, BB and statin. LDL is at goal. Last LP 5/19  showed LDL at 11 mg/dl.   6. ESRD: on HD. Management per nephrology.   7. Anemia: per primary service. Has had iv iron related to chronic GI blood loss and CRF.  8. Hyponatremia: Na 131. Management per primary.    For questions or updates, please contact [place] Please consult [email or web address] for contact info under        Signed, Lyda Jester, PA-C  05/18/2018, 7:23 AM    Patient examined chart reviewed Pale lungs clear AS murmur clotted fistula LUE with dialysis catheter right subclavian For cath today if grafts ok and CAD not an issue will need TAVR referral   Jenkins Rouge

## 2018-05-19 DIAGNOSIS — L8990 Pressure ulcer of unspecified site, unspecified stage: Secondary | ICD-10-CM

## 2018-05-19 LAB — RENAL FUNCTION PANEL
Albumin: 2.5 g/dL — ABNORMAL LOW (ref 3.5–5.0)
Anion gap: 11 (ref 5–15)
BUN: 16 mg/dL (ref 8–23)
CO2: 25 mmol/L (ref 22–32)
Calcium: 8 mg/dL — ABNORMAL LOW (ref 8.9–10.3)
Chloride: 96 mmol/L — ABNORMAL LOW (ref 98–111)
Creatinine, Ser: 2.5 mg/dL — ABNORMAL HIGH (ref 0.44–1.00)
GFR calc Af Amer: 20 mL/min — ABNORMAL LOW (ref >60)
GFR calc non Af Amer: 18 mL/min — ABNORMAL LOW (ref >60)
Glucose, Bld: 107 mg/dL — ABNORMAL HIGH (ref 70–99)
Phosphorus: 2.6 mg/dL (ref 2.5–4.6)
Potassium: 3.5 mmol/L (ref 3.5–5.1)
Sodium: 132 mmol/L — ABNORMAL LOW (ref 135–145)

## 2018-05-19 MED ORDER — CARVEDILOL 12.5 MG PO TABS: 12.5000 mg | ORAL_TABLET | ORAL | Status: DC

## 2018-05-19 MED ORDER — ASPIRIN 81 MG PO TABS: 81.0000 mg | ORAL_TABLET | Freq: Every day | ORAL | Status: DC

## 2018-05-19 NOTE — Progress Notes (Addendum)
[facility] Progress Note   Subjective:  Seen in room. No complaints today. Denies CP/SOB. Says going home today.    Objective Vitals:   05/19/18 0451 05/19/18 0649 05/19/18 0850 05/19/18 1023  BP: 100/60 100/61 90/61 103/70  Pulse: (!) 102 (!) 103 (!) 112 91  Resp: (!) 23 (!) 21    Temp:  98.7 F (37.1 C)    TempSrc:  Oral    SpO2: 95% 95%    Weight:  49.7 kg    Height:       Physical Exam General: Frail elderly female NAD, overall looking better  Heart: RRR 2/6 SEM Lungs: clear bilat no rales or wheezing Abdomen: soft NT/ND Extremities: no LE edema  Dialysis Access: RIJ TDC; L A AVF no bruit   Dialysis Orders:  MWF NW 4h   49kg  2/2.25 bath  R TDC (AVF clottted)  Hep 1000 - hect 2ug tiw - mircera 50 every 2 last 9/23  Assessment/Plan: 1. Chest pain -- peaked troponins/Echo with new HFrEF.  SP L/R heart cath 10/4.  No sig new disease (hx CABG). Continue medical management  2. ESRD -HD MWF  AVF unable to be salvaged, using R IJ TDC. To f/u [facility] for new access as outpatient. Next HD 10/7  3. Symptomatic Anemia - Hgb stable s/p 2U prbcs on 10/1  +FOBT 4. HTN/volume- BP low/stable. On Coreg 12.5 TIW.  Net UF 2.3L Post HD wt 49.7kg. Continue to titrate volume down as tolerated.  5. MBD - Ca/Phos ok. Continue VDRA. No binders here.  6. CAD/ s/p 3V CABG 2010 7. NICM/ EF 20-25% - new echo finding w/ +wma's  8. AS - not severe by heart cath 9. Gen weakness - multifactorial 2/2 to above dx    Lynnda Child PA-C [facility] Pager [phone] 05/19/2018,11:47 AM  Pt seen, examined and agree w A/P as above.  Kelly Splinter MD [facility] pager [phone]   05/19/2018, 12:50 PM     Additional Objective Labs: Basic Metabolic Panel: Recent Labs  Lab 05/17/18 0539 05/18/18 0657 05/19/18 0445  NA 133* 131* 132*  K 3.8 4.0 3.5  CL 97* 96* 96*  CO2 27 27 25   GLUCOSE 108* 124* 107*  BUN 14 34* 16  CREATININE 2.45*  3.94* 2.50*  CALCIUM 8.5* 8.6* 8.0*  PHOS 3.2 3.7 2.6   CBC: Recent Labs  Lab 05/16/18 1936 05/17/18 0539 05/17/18 1727 05/18/18 0657 05/18/18 1623  WBC 5.5 5.1 6.6 4.8 4.8  HGB 10.4* 9.4* 9.0* 9.2* 9.4*  HCT 32.5* 28.8* 27.7* 28.6* 29.9*  MCV 105.2* 104.3* 105.7* 106.3* 107.2*  PLT 111* 105* 102* 102* 109*   Blood Culture    Component Value Date/Time   SDES BLOOD RIGHT HAND 10/08/2008 1530   SPECREQUEST BOTTLES DRAWN AEROBIC AND ANAEROBIC 10CC 10/08/2008 1530   CULT NO GROWTH 5 DAYS 10/08/2008 1530   REPTSTATUS 10/15/2008 FINAL 10/08/2008 1530    Cardiac Enzymes: Recent Labs  Lab 05/15/18 1453 05/15/18 2000 05/16/18 0021 05/16/18 0620  TROPONINI 1.35* 1.83* 1.96* 1.48*   CBG: Recent Labs  Lab 05/18/18 1124  GLUCAP 114*   Iron Studies: No results for input(s): IRON, TIBC, TRANSFERRIN, FERRITIN in the last 72 hours. Lab Results  Component Value Date   INR 1.07 04/18/2018   INR 1.4 10/16/2008   INR 1.1 10/08/2008   Medications: . sodium chloride 250 mL (05/15/18 2101)  . sodium chloride     . ALPRAZolam  1 mg  Oral QHS  . carvedilol  12.5 mg Oral Q T,Th,S,Su  . Chlorhexidine Gluconate Cloth  6 each Topical Q0600  . doxercalciferol  2 mcg Intravenous Q M,W,F-HD  . feeding supplement (ENSURE ENLIVE)  237 mL Oral BID BM  . folic acid  1 mg Oral Daily  . heparin  5,000 Units Subcutaneous Q8H  . lubiprostone  24 mcg Oral QODAY  . multivitamin  1 tablet Oral QHS  . rosuvastatin  5 mg Oral QHS  . sevelamer carbonate  1,600 mg Oral TID WC  . sodium chloride flush  3 mL Intravenous Q12H  . sodium chloride flush  3 mL Intravenous Q12H  . trimethoprim  50 mg Oral Daily

## 2018-05-19 NOTE — Discharge Summary (Signed)
Physician Discharge Summary  BRETA DEMEDEIROS MAU:[id number] DOB: Jan 29, 1941 DOA: 05/15/2018  PCP: [name], MD  Admit date: 05/15/2018 Discharge date: 05/19/2018  Recommendations for Outpatient Follow-up:   New systolic CHF, acute on chronic diastolic CHF, left bundle branch block new, demand ischemia. Cath showed severe two-vessel disease without significant progression.   Aortic stenosis --Follow-up with cardiology as an outpatient   Follow-up Information    [name], MD Follow up.   Specialty:  Family Medicine Why:  as needed Contact information: [address] [place] [address] [phone]        Larey Dresser, MD. Schedule an appointment as soon as possible for a visit.   Specialty:  Cardiology Why:  2 weeks Contact information: [address] [place] [address] [phone]            Discharge Diagnoses:  1. New systolic CHF, acute on chronic diastolic CHF, left bundle branch block new, demand ischemia 2. Aortic stenosis 3. Symptomatic anemia, known iron deficiency anemia 4. Chronic thrombocytopenia, stable 5. Aortic stenosis, moderate to severe 6. ESRD 7. DM type 2 with diabetic neuropathy, diet-controlled 8. Essential hypertension  Discharge Condition: improved Disposition: home  Diet recommendation: heart healthy diet  Filed Weights   05/18/18 1420 05/18/18 1840 05/19/18 0649  Weight: 52.2 kg 49.7 kg 49.7 kg    History of present illness:  77 year old woman presented to her cardiology office with chest pain, found to have new left bundle branch block on EKG and sent to the emergency department as code STEMI.  Admitted for symptomatic anemia thought secondary to chronic kidney disease and was transfused 2 units PRBC.  Subsequent echocardiogram revealed new systolic dysfunction.  Hospital Course:  Patient was seen by cardiology and underwent cardiac catheterization with results as below.  Medical  management recommended.  Underwent hemodialysis without complication.  Hospitalization was uncomplicated.  Individual issues as below.  New systolic CHF, acute on chronic diastolic CHF, left bundle branch block new, demand ischemia. Cath showed severe two-vessel disease without significant progression.  --Cardiology recommended medical management.  Continue carvedilol.  Not a candidate for Entresto/ACE inhibitor/ARB, spironolactone per cardiology, secondary to ESRD.  Can consider nitrates and hydralazine as an outpatient blood pressure allows. --ASA 81 mg daily  Aortic stenosis --Follow-up with cardiology as an outpatient  Symptomatic anemia, known iron deficiency anemia, history of GI bleed March 2018.  Heme positive.  Transfused total 2 units PRBC.  Receives outpatient injections at the cancer center every 3 weeks. --Hemoglobin remained stable.  No evidence of bleeding.  Patient does not desire any GI work-up and I concur at this point any further evaluation can be transferred to the outpatient setting should she have bleeding.  Chronic thrombocytopenia, stable.  No further inpatient evaluation suggested.  Follows regularly with Dr. Alen Blew.  Aortic stenosis, moderate to severe --Follow-up with cardiology as an outpatient  ESRD --Continue hemodialysis per nephrology  DM type 2 with diabetic neuropathy, diet-controlled --No need for blood sugar checks.  Essential hypertension --Continue Norvasc, carvedilol  Spontaneous GI bleed March 2018, negative EGD, required total 6 units of blood at that time.  Subsequent colonoscopy showed few small ulcers, erosions, diverticulosis.  Unremarkable capsule endoscopy July 2018.  Aortic atherosclerosis  Consultants:  Nephrology  Cardiology  Procedures:  Echo Study Conclusions  - Left ventricle: The cavity size was normal. Wall thickness was increased in a pattern of mild LVH. Systolic function was severely reduced. The  estimated ejection  fraction was in the range of 20% to 25%. Diffuse hypokinesis with relative preservation of the basal anterior wall and the basal to mid lateral wall. Septal-lateral dyssynychrony. Doppler parameters are consistent with restrictive physiology, indicative of decreased left ventricular diastolic compliance and/or increased left atrial pressure. E/medial e&' >15 suggests LV end diastolic pressure at least 20 mmHg. 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 There was trivial regurgitation. Overall, suspect moderate, low gradient aortic stenosis. Visually does not appear severe (not marked turbulence). Mean gradient (S): 11 mm Hg. Valve area (VTI): 1.01 cm^2. - Mitral valve: Mildly calcified annulus. There was moderate regurgitation. - Left atrium: The atrium was moderately dilated. - Right ventricle: The cavity size was normal. Systolic function was mildly to moderately reduced. - Right atrium: The atrium was moderately dilated. - Tricuspid valve: There was moderate regurgitation. Peak RV-RA gradient (S): 39 mm Hg. - Pulmonary arteries: PA peak pressure: 47 mm Hg (S). - Systemic veins: IVC measured 2.0 cm with < 50% respirophasic variation, suggesting RA pressure 8 mmHg.  Impressions:  - Normal LV size with mild LV hypertrophy. EF 20-25% with wall motion abnormalities as noted above. Restrictive diastolic function. Normal RV size with mild to moderate systolic dysfunction. Moderate MR, moderate TR. Mild pulmonary hypertension. The aortic valve appeared functionally bicuspid (left and right cusps fused). There was severe calcification. Mean gradient 11 with AVA 1.01 cm^2 by continuity equation. Visually, think low gradient moderate aortic stenosis is most likely.  Cath Severe two-vessel native CAD with diffuse 95% followed by 100%  CTO of the mid RCA (fills via left to right collaterals), and 99% proximal LAD prior to D1 followed by 100% CTO in the mid. Widely patent native Circumflex-OM with mild diffuse disease. Patent LIMA-LAD with heavy calcification in the subclavian artery near the takeoff as well as a proximal calcified 40% stenosis. Chronic total occlusion of SVG-RCA and SVG-OM. --Extent of CAD is essentially not much different than prior to CABG with exception of now CTO of the RCA is post a subtotal occlusion.  The only new lesion that was not present on pre-CABG films is a 99% proximal LAD lesion prior to D1. -->  Reduction in LVEF is out of proportion with extent of CAD change.  Relatively normal PA pressures with PCWP of 18 and LVEDP of 22. Probably moderate aortic stenosis with minimal gradient but low output by thermodilution. Conflicting Cardiac Output/Index by Fick and thermodilution.  Thermodilution estimates 3.57/2.19, Fick estimates 5.09/3.13.  Recommendation: Continued medical management of cardiomyopathy and volume control with dialysis. Does not appear to have low output aortic stenosis given the minimal gradient in the Cath Lab. Consider nonischemic etiology for reduced EF.  Today's assessment: S: feels good O: Vitals:  Vitals:   05/19/18 0850 05/19/18 1023  BP: 90/61 103/70  Pulse: (!) 112 91  Resp:    Temp:    SpO2:      Constitutional:  . Appears calm and comfortable Respiratory:  . CTA bilaterally, no w/r/r.  . Respiratory effort normal.  Cardiovascular:  . RRR, no m/r/g . No LE extremity edema   . Telemetry SR Psychiatric:  . Mental status o Mood, affect appropriate  BMP noted  Discharge Instructions  Discharge Instructions    Diet - low sodium heart healthy   Complete by:  As directed    Discharge instructions   Complete by:  As directed    Call your physician or seek  immediate medical attention for pain, shortness of breath, swelling or worsening of condition.    Increase activity slowly   Complete by:  As directed      Allergies as of 05/19/2018      Reactions   Doxycycline Nausea And Vomiting   Caused "DEATHLY NAUSEA AND VOMITING"   Lipitor [atorvastatin] Other (See Comments)   Stomach pain   Strawberry Extract Rash      Medication List    STOP taking these medications   amLODipine 5 MG tablet Commonly known as:  NORVASC     TAKE these medications   ALPRAZolam 1 MG tablet Commonly known as:  XANAX Take 1 mg by mouth at bedtime.   AMITIZA 24 MCG capsule Generic drug:  lubiprostone Take 24 mcg by mouth See admin instructions. Take 24 mcg by mouth two times a day, every other day   aspirin 81 MG tablet Take 1 tablet (81 mg total) by mouth daily.   b complex-vitamin c-folic acid 0.8 MG Tabs tablet Take 1 tablet by mouth daily.   calcitRIOL 0.5 MCG capsule Commonly known as:  ROCALTROL Take 0.5 mcg by mouth daily.   carvedilol 12.5 MG tablet Commonly known as:  COREG Take 1 tablet (12.5 mg total) by mouth every Tuesday, Thursday, Saturday, and Sunday. What changed:  See the new instructions.   folic acid 1 MG tablet Commonly known as:  FOLVITE Take 1 tablet (1 mg total) by mouth daily.   losartan 25 MG tablet Commonly known as:  COZAAR Take 1 tablet (25 mg total) by mouth daily.   rosuvastatin 5 MG tablet Commonly known as:  CRESTOR TAKE 1 TABLET(5 MG) BY MOUTH DAILY What changed:    how much to take  how to take this  when to take this  additional instructions   sevelamer carbonate 800 MG tablet Commonly known as:  RENVELA Take 1,600 mg 3 (three) times daily with meals by mouth.   trimethoprim 100 MG tablet Commonly known as:  TRIMPEX Take 50 mg by mouth daily.   vitamin B-12 500 MCG tablet Commonly known as:  CYANOCOBALAMIN Take 1 tablet (500 mcg total) by mouth daily.      Allergies  Allergen Reactions  . Doxycycline Nausea And Vomiting    Caused "DEATHLY NAUSEA AND VOMITING"  . Lipitor  [Atorvastatin] Other (See Comments)    Stomach pain  . Strawberry Extract Rash    The results of significant diagnostics from this hospitalization (including imaging, microbiology, ancillary and laboratory) are listed below for reference.    Significant Diagnostic Studies: Dg Chest Port 1 View  Result Date: 05/15/2018 CLINICAL DATA:  Chest pain. EXAM: PORTABLE CHEST 1 VIEW COMPARISON:  Radiographs of September 26, 2016. FINDINGS: Stable cardiomegaly. Status post coronary bypass graft. Atherosclerosis of thoracic aorta. Right internal jugular dialysis catheter is noted with tip in expected position of cavoatrial junction. No pneumothorax or pleural effusion is noted. Both lungs are clear. The visualized skeletal structures are unremarkable. IMPRESSION: No acute cardiopulmonary abnormality seen. Interval placement of right internal jugular dialysis catheter with tip in expected position of cavoatrial junction. Aortic Atherosclerosis (ICD10-I70.0). Electronically Signed   By: Marijo Conception, M.D.   On: 05/15/2018 13:37   Labs: Basic Metabolic Panel: Recent Labs  Lab 05/15/18 1241 05/16/18 0620 05/17/18 0539 05/18/18 0657 05/19/18 0445  NA 133* 131* 133* 131* 132*  K 4.1 4.2 3.8 4.0 3.5  CL 95* 95* 97* 96* 96*  CO2 27 25 27 27  25  GLUCOSE 229* 133* 108* 124* 107*  BUN 22 28* 14 34* 16  CREATININE 3.29* 3.54* 2.45* 3.94* 2.50*  CALCIUM 8.6* 8.5* 8.5* 8.6* 8.0*  MG  --   --  1.8  --   --   PHOS  --   --  3.2 3.7 2.6   Liver Function Tests: Recent Labs  Lab 05/17/18 0539 05/18/18 0657 05/19/18 0445  ALBUMIN 2.8* 2.7* 2.5*   CBC: Recent Labs  Lab 05/16/18 1936 05/17/18 0539 05/17/18 1727 05/18/18 0657 05/18/18 1623  WBC 5.5 5.1 6.6 4.8 4.8  HGB 10.4* 9.4* 9.0* 9.2* 9.4*  HCT 32.5* 28.8* 27.7* 28.6* 29.9*  MCV 105.2* 104.3* 105.7* 106.3* 107.2*  PLT 111* 105* 102* 102* 109*   Cardiac Enzymes: Recent Labs  Lab 05/15/18 1453 05/15/18 2000 05/16/18 0021 05/16/18 0620    TROPONINI 1.35* 1.83* 1.96* 1.48*    CBG: Recent Labs  Lab 05/18/18 1124  GLUCAP 114*    Principal Problem:   Symptomatic anemia Active Problems:   Diabetes mellitus type II, non insulin dependent (HCC)   Essential hypertension   ESRD (end stage renal disease) on dialysis ([place])   Demand ischemia (HCC)   Acute combined systolic and diastolic heart failure (HCC)   LBBB (left bundle branch block)   Aortic stenosis, moderate   Pressure ulcer   Time coordinating discharge: 35 minutes  Signed:  Murray Hodgkins, MD [facility] 05/19/2018, 11:55 AM

## 2018-05-19 NOTE — Progress Notes (Signed)
Progress Note  Patient Name: [name] Date of Encounter: 05/19/2018  Primary Cardiologist: Loralie Champagne, MD   Subjective   Overall no new complaints, no chest pain, no shortness of breath.  Laying comfortably in bed, husband at bedside  Inpatient Medications    Scheduled Meds: . ALPRAZolam  1 mg Oral QHS  . carvedilol  12.5 mg Oral Q T,Th,S,Su  . Chlorhexidine Gluconate Cloth  6 each Topical Q0600  . doxercalciferol  2 mcg Intravenous Q M,W,F-HD  . feeding supplement (ENSURE ENLIVE)  237 mL Oral BID BM  . folic acid  1 mg Oral Daily  . heparin  5,000 Units Subcutaneous Q8H  . lubiprostone  24 mcg Oral QODAY  . multivitamin  1 tablet Oral QHS  . rosuvastatin  5 mg Oral QHS  . sevelamer carbonate  1,600 mg Oral TID WC  . sodium chloride flush  3 mL Intravenous Q12H  . sodium chloride flush  3 mL Intravenous Q12H  . trimethoprim  50 mg Oral Daily   Continuous Infusions: . sodium chloride 250 mL (05/15/18 2101)  . sodium chloride     PRN Meds: sodium chloride, sodium chloride, acetaminophen **OR** acetaminophen, ALPRAZolam, calcium carbonate (dosed in mg elemental calcium), camphor-menthol **AND** hydrOXYzine, docusate sodium, ondansetron **OR** ondansetron (ZOFRAN) IV, sodium chloride flush, sorbitol, zolpidem   Vital Signs    Vitals:   05/19/18 0451 05/19/18 0649 05/19/18 0850 05/19/18 1023  BP: 100/60 100/61 90/61 103/70  Pulse: (!) 102 (!) 103 (!) 112 91  Resp: (!) 23 (!) 21    Temp:  98.7 F (37.1 C)    TempSrc:  Oral    SpO2: 95% 95%    Weight:  49.7 kg    Height:        Intake/Output Summary (Last 24 hours) at 05/19/2018 1130 Last data filed at 05/19/2018 0902 Gross per 24 hour  Intake 363 ml  Output 2350 ml  Net -1987 ml   Filed Weights   05/18/18 1420 05/18/18 1840 05/19/18 0649  Weight: 52.2 kg 49.7 kg 49.7 kg    Telemetry    Sinus rhythm, interventricular conduction delay- Personally Reviewed  ECG    Sinus rhythm left bundle branch  block- Personally Reviewed  Physical Exam   GEN: No acute distress.   Neck: No JVD Cardiac: RRR, 2/6 systolic right upper sternal border, no rubs, or gallops.  Respiratory: Clear to auscultation bilaterally. GI: Soft, nontender, non-distended  MS: No edema; No deformity. Neuro:  Nonfocal  Psych: Normal affect   Labs    Chemistry Recent Labs  Lab 05/17/18 0539 05/18/18 0657 05/19/18 0445  NA 133* 131* 132*  K 3.8 4.0 3.5  CL 97* 96* 96*  CO2 27 27 25   GLUCOSE 108* 124* 107*  BUN 14 34* 16  CREATININE 2.45* 3.94* 2.50*  CALCIUM 8.5* 8.6* 8.0*  ALBUMIN 2.8* 2.7* 2.5*  GFRNONAA 18* 10* 18*  GFRAA 21* 12* 20*  ANIONGAP 9 8 11      Hematology Recent Labs  Lab 05/17/18 1727 05/18/18 0657 05/18/18 1623  WBC 6.6 4.8 4.8  RBC 2.62* 2.69* 2.79*  HGB 9.0* 9.2* 9.4*  HCT 27.7* 28.6* 29.9*  MCV 105.7* 106.3* 107.2*  MCH 34.4* 34.2* 33.7  MCHC 32.5 32.2 31.4  RDW 17.9* 17.6* 17.2*  PLT 102* 102* 109*    Cardiac Enzymes Recent Labs  Lab 05/15/18 1453 05/15/18 2000 05/16/18 0021 05/16/18 0620  TROPONINI 1.35* 1.83* 1.96* 1.48*    Recent Labs  Lab  05/15/18 1236  TROPIPOC 0.96*     BNPNo results for input(s): BNP, PROBNP in the last 168 hours.   DDimer No results for input(s): DDIMER in the last 168 hours.   Radiology    No results found.  Cardiac Studies   Cardiac catheterization 38/11/6657:  LV end diastolic pressure is moderately elevated.  There is mild-moderate aortic valve stenosis. --Does not meet criteria for low output aortic stenosis.  Prox RCA lesion is 95% stenosed. Prox RCA to Mid RCA lesion is 95% stenosed. Mid RCA lesion is 100% stenosed -CTO at site of initial subtotal occlusion  SVG-RCA graft was visualized by angiography. Origin lesion is 100% stenosed.  Minimal to mild disease in the circumflex which essentially is a large OM branch.  SVG-CxOM graft was visualized by angiography. Origin lesion is 100% stenosed.  Prox LAD lesion  is 99% stenosed with 70% stenosed side branch in Ost 1st Diag. -This represents the only significant new lesion from pre-CABG.  Prox LAD to Mid LAD lesion is 100% stenosed -CTO at original 80% stenosis site.  LIMA-mLAD graft was visualized by angiography and is large. Origin lesion is 40% stenosed.  Heavily calcified left subclavian artery at the takeoff of LIMA and vertebral artery.   Severe two-vessel native CAD with diffuse 95% followed by 100% CTO of the mid RCA (fills via left to right collaterals), and 99% proximal LAD prior to D1 followed by 100% CTO in the mid. Widely patent native Circumflex-OM with mild diffuse disease. Patent LIMA-LAD with heavy calcification in the subclavian artery near the takeoff as well as a proximal calcified 40% stenosis. Chronic total occlusion of SVG-RCA and SVG-OM. --Extent of CAD is essentially not much different than prior to CABG with exception of now CTO of the RCA is post a subtotal occlusion.  The only new lesion that was not present on pre-CABG films is a 99% proximal LAD lesion prior to D1. -->  Reduction in LVEF is out of proportion with extent of CAD change.  Relatively normal PA pressures with PCWP of 18 and LVEDP of 22. Probably moderate aortic stenosis with minimal gradient but low output by thermodilution. Conflicting Cardiac Output/Index by Fick and thermodilution.  Thermodilution estimates 3.57/2.19, Fick estimates 5.09/3.13.  Recommendation: Continued medical management of cardiomyopathy and volume control with dialysis. Does not appear to have low output aortic stenosis given the minimal gradient in the Cath Lab. Consider nonischemic etiology for reduced EF.  Recommend Aspirin 81mg  daily for severe CAD.[name]  ECHO 05/16/18: - Normal LV size with mild LV hypertrophy. EF 20-25% with wall   motion abnormalities as noted above. Restrictive diastolic   function. Normal RV size with mild to moderate systolic   dysfunction. Moderate MR,  moderate TR. Mild pulmonary   hypertension. The aortic valve appeared functionally bicuspid   (left and right cusps fused). There was severe calcification.   Mean gradient 11 with AVA 1.01 cm^2 by continuity equation.   Visually, think low gradient moderate aortic stenosis is most   likely.  Patient Profile     77 y.o. 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  Continue with medical management.  Aspirin 81 mg  suggested.  Bicuspid aortic valve with overall mild aortic stenosis - Gradients and Cath Lab were not significant.  Please see cath report for full details.  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  EF 20 to 25%. -Blood pressure has been soft at times here in the hospital.  Carvedilol is being distributed at various times secondary to her hemodialysis. -Fluid management per HD  Left bundle branch block - New.  Secondary to LV dysfunction.  End-stage renal disease - Fluid management per HD.  Clotted fistula left upper extremity with dialysis catheter right subclavian  Okay for discharge from our perspective  [place] will sign off.   Medication Recommendations:  As above Other recommendations (labs, testing, etc):  None  Follow up as an outpatient:  As previously sched with Dr. Aundra Dubin  For questions or updates, please contact [facility] Please consult [email or web address] for contact info under        Signed, Candee Furbish, MD  05/19/2018, 11:30 AM

## 2018-05-21 DIAGNOSIS — D649 Anemia, unspecified: Secondary | ICD-10-CM | POA: Diagnosis not present

## 2018-05-21 DIAGNOSIS — D689 Coagulation defect, unspecified: Secondary | ICD-10-CM | POA: Diagnosis not present

## 2018-05-21 DIAGNOSIS — E8770 Fluid overload, unspecified: Secondary | ICD-10-CM | POA: Diagnosis not present

## 2018-05-21 DIAGNOSIS — N186 End stage renal disease: Secondary | ICD-10-CM | POA: Diagnosis not present

## 2018-05-21 DIAGNOSIS — N2581 Secondary hyperparathyroidism of renal origin: Secondary | ICD-10-CM | POA: Diagnosis not present

## 2018-05-23 ENCOUNTER — Other Ambulatory Visit: Payer: Medicare HMO

## 2018-05-23 ENCOUNTER — Ambulatory Visit: Payer: Medicare HMO

## 2018-05-26 ENCOUNTER — Emergency Department (HOSPITAL_COMMUNITY): Payer: Medicare HMO

## 2018-05-26 ENCOUNTER — Inpatient Hospital Stay (HOSPITAL_COMMUNITY)
Admission: EM | Admit: 2018-05-26 | Discharge: 2018-05-30 | DRG: 291 | Disposition: A | Discharge status: Home / Self Care | Payer: Medicare HMO | Attending: Internal Medicine | Admitting: Internal Medicine

## 2018-05-26 ENCOUNTER — Encounter (HOSPITAL_COMMUNITY): Payer: Self-pay | Admitting: Oncology

## 2018-05-26 DIAGNOSIS — Z9841 Cataract extraction status, right eye: Secondary | ICD-10-CM

## 2018-05-26 DIAGNOSIS — E119 Type 2 diabetes mellitus without complications: Secondary | ICD-10-CM

## 2018-05-26 DIAGNOSIS — I5021 Acute systolic (congestive) heart failure: Secondary | ICD-10-CM

## 2018-05-26 DIAGNOSIS — I2510 Atherosclerotic heart disease of native coronary artery without angina pectoris: Secondary | ICD-10-CM | POA: Diagnosis present

## 2018-05-26 DIAGNOSIS — R0603 Acute respiratory distress: Secondary | ICD-10-CM | POA: Diagnosis present

## 2018-05-26 DIAGNOSIS — I110 Hypertensive heart disease with heart failure: Secondary | ICD-10-CM | POA: Diagnosis not present

## 2018-05-26 DIAGNOSIS — Z9842 Cataract extraction status, left eye: Secondary | ICD-10-CM

## 2018-05-26 DIAGNOSIS — G9340 Encephalopathy, unspecified: Secondary | ICD-10-CM | POA: Diagnosis not present

## 2018-05-26 DIAGNOSIS — I252 Old myocardial infarction: Secondary | ICD-10-CM

## 2018-05-26 DIAGNOSIS — Z515 Encounter for palliative care: Secondary | ICD-10-CM | POA: Diagnosis not present

## 2018-05-26 DIAGNOSIS — R0602 Shortness of breath: Secondary | ICD-10-CM | POA: Diagnosis not present

## 2018-05-26 DIAGNOSIS — N186 End stage renal disease: Secondary | ICD-10-CM

## 2018-05-26 DIAGNOSIS — I472 Ventricular tachycardia: Secondary | ICD-10-CM | POA: Diagnosis not present

## 2018-05-26 DIAGNOSIS — E43 Unspecified severe protein-calorie malnutrition: Secondary | ICD-10-CM | POA: Diagnosis present

## 2018-05-26 DIAGNOSIS — R0902 Hypoxemia: Secondary | ICD-10-CM | POA: Diagnosis not present

## 2018-05-26 DIAGNOSIS — Z09 Encounter for follow-up examination after completed treatment for conditions other than malignant neoplasm: Secondary | ICD-10-CM

## 2018-05-26 DIAGNOSIS — K219 Gastro-esophageal reflux disease without esophagitis: Secondary | ICD-10-CM | POA: Diagnosis present

## 2018-05-26 DIAGNOSIS — Z4682 Encounter for fitting and adjustment of non-vascular catheter: Secondary | ICD-10-CM | POA: Diagnosis not present

## 2018-05-26 DIAGNOSIS — E1140 Type 2 diabetes mellitus with diabetic neuropathy, unspecified: Secondary | ICD-10-CM | POA: Diagnosis present

## 2018-05-26 DIAGNOSIS — N185 Chronic kidney disease, stage 5: Secondary | ICD-10-CM | POA: Diagnosis not present

## 2018-05-26 DIAGNOSIS — Z951 Presence of aortocoronary bypass graft: Secondary | ICD-10-CM

## 2018-05-26 DIAGNOSIS — E785 Hyperlipidemia, unspecified: Secondary | ICD-10-CM | POA: Diagnosis present

## 2018-05-26 DIAGNOSIS — R062 Wheezing: Secondary | ICD-10-CM | POA: Diagnosis not present

## 2018-05-26 DIAGNOSIS — J90 Pleural effusion, not elsewhere classified: Secondary | ICD-10-CM | POA: Diagnosis not present

## 2018-05-26 DIAGNOSIS — I953 Hypotension of hemodialysis: Secondary | ICD-10-CM | POA: Diagnosis not present

## 2018-05-26 DIAGNOSIS — D649 Anemia, unspecified: Secondary | ICD-10-CM

## 2018-05-26 DIAGNOSIS — Z66 Do not resuscitate: Secondary | ICD-10-CM | POA: Diagnosis present

## 2018-05-26 DIAGNOSIS — I959 Hypotension, unspecified: Secondary | ICD-10-CM | POA: Diagnosis not present

## 2018-05-26 DIAGNOSIS — Z87891 Personal history of nicotine dependence: Secondary | ICD-10-CM

## 2018-05-26 DIAGNOSIS — I132 Hypertensive heart and chronic kidney disease with heart failure and with stage 5 chronic kidney disease, or end stage renal disease: Principal | ICD-10-CM | POA: Diagnosis present

## 2018-05-26 DIAGNOSIS — Z86010 Personal history of colonic polyps: Secondary | ICD-10-CM

## 2018-05-26 DIAGNOSIS — Z7984 Long term (current) use of oral hypoglycemic drugs: Secondary | ICD-10-CM

## 2018-05-26 DIAGNOSIS — D631 Anemia in chronic kidney disease: Secondary | ICD-10-CM | POA: Diagnosis present

## 2018-05-26 DIAGNOSIS — G8929 Other chronic pain: Secondary | ICD-10-CM | POA: Diagnosis present

## 2018-05-26 DIAGNOSIS — W44F3XA Food entering into or through a natural orifice, initial encounter: Secondary | ICD-10-CM

## 2018-05-26 DIAGNOSIS — N189 Chronic kidney disease, unspecified: Secondary | ICD-10-CM

## 2018-05-26 DIAGNOSIS — E1122 Type 2 diabetes mellitus with diabetic chronic kidney disease: Secondary | ICD-10-CM | POA: Diagnosis present

## 2018-05-26 DIAGNOSIS — T17920A Food in respiratory tract, part unspecified causing asphyxiation, initial encounter: Secondary | ICD-10-CM | POA: Diagnosis not present

## 2018-05-26 DIAGNOSIS — R1111 Vomiting without nausea: Secondary | ICD-10-CM | POA: Diagnosis not present

## 2018-05-26 DIAGNOSIS — I213 ST elevation (STEMI) myocardial infarction of unspecified site: Secondary | ICD-10-CM | POA: Diagnosis not present

## 2018-05-26 DIAGNOSIS — M545 Low back pain: Secondary | ICD-10-CM | POA: Diagnosis present

## 2018-05-26 DIAGNOSIS — Z961 Presence of intraocular lens: Secondary | ICD-10-CM | POA: Diagnosis present

## 2018-05-26 DIAGNOSIS — Z91018 Allergy to other foods: Secondary | ICD-10-CM

## 2018-05-26 DIAGNOSIS — T17928A Food in respiratory tract, part unspecified causing other injury, initial encounter: Secondary | ICD-10-CM

## 2018-05-26 DIAGNOSIS — J690 Pneumonitis due to inhalation of food and vomit: Secondary | ICD-10-CM | POA: Diagnosis not present

## 2018-05-26 DIAGNOSIS — I5043 Acute on chronic combined systolic (congestive) and diastolic (congestive) heart failure: Secondary | ICD-10-CM | POA: Diagnosis not present

## 2018-05-26 DIAGNOSIS — R0600 Dyspnea, unspecified: Secondary | ICD-10-CM | POA: Diagnosis present

## 2018-05-26 DIAGNOSIS — Z7189 Other specified counseling: Secondary | ICD-10-CM | POA: Diagnosis not present

## 2018-05-26 DIAGNOSIS — J9601 Acute respiratory failure with hypoxia: Secondary | ICD-10-CM | POA: Diagnosis present

## 2018-05-26 DIAGNOSIS — Z681 Body mass index (BMI) 19 or less, adult: Secondary | ICD-10-CM

## 2018-05-26 DIAGNOSIS — Z79899 Other long term (current) drug therapy: Secondary | ICD-10-CM

## 2018-05-26 DIAGNOSIS — I10 Essential (primary) hypertension: Secondary | ICD-10-CM | POA: Diagnosis present

## 2018-05-26 DIAGNOSIS — Z992 Dependence on renal dialysis: Secondary | ICD-10-CM

## 2018-05-26 DIAGNOSIS — F419 Anxiety disorder, unspecified: Secondary | ICD-10-CM | POA: Diagnosis present

## 2018-05-26 DIAGNOSIS — I447 Left bundle-branch block, unspecified: Secondary | ICD-10-CM | POA: Diagnosis not present

## 2018-05-26 DIAGNOSIS — Z7982 Long term (current) use of aspirin: Secondary | ICD-10-CM

## 2018-05-26 DIAGNOSIS — Z888 Allergy status to other drugs, medicaments and biological substances status: Secondary | ICD-10-CM

## 2018-05-26 DIAGNOSIS — R0609 Other forms of dyspnea: Secondary | ICD-10-CM | POA: Diagnosis not present

## 2018-05-26 LAB — BASIC METABOLIC PANEL
ANION GAP: 8 (ref 5–15)
BUN: 31 mg/dL — AB (ref 8–23)
CO2: 24 mmol/L (ref 22–32)
Calcium: 8.5 mg/dL — ABNORMAL LOW (ref 8.9–10.3)
Chloride: 94 mmol/L — ABNORMAL LOW (ref 98–111)
Creatinine, Ser: 3.21 mg/dL — ABNORMAL HIGH (ref 0.44–1.00)
GFR calc Af Amer: 15 mL/min — ABNORMAL LOW (ref >60)
GFR, EST NON AFRICAN AMERICAN: 13 mL/min — AB (ref >60)
GLUCOSE: 156 mg/dL — AB (ref 70–99)
POTASSIUM: 4.6 mmol/L (ref 3.5–5.1)
SODIUM: 126 mmol/L — AB (ref 135–145)

## 2018-05-26 LAB — CBC
HCT: 24.4 % — ABNORMAL LOW (ref 36.0–46.0)
Hemoglobin: 7.9 g/dL — ABNORMAL LOW (ref 12.0–15.0)
MCH: 34.6 pg — AB (ref 26.0–34.0)
MCHC: 32.4 g/dL (ref 30.0–36.0)
MCV: 107 fL — ABNORMAL HIGH (ref 80.0–100.0)
PLATELETS: 121 10*3/uL — AB (ref 150–400)
RBC: 2.28 MIL/uL — ABNORMAL LOW (ref 3.87–5.11)
RDW: 18.5 % — ABNORMAL HIGH (ref 11.5–15.5)
WBC: 9 10*3/uL (ref 4.0–10.5)
nRBC: 0.2 % (ref 0.0–0.2)

## 2018-05-26 LAB — I-STAT TROPONIN, ED: TROPONIN I, POC: 0.1 ng/mL — AB (ref 0.00–0.08)

## 2018-05-26 LAB — BRAIN NATRIURETIC PEPTIDE: B Natriuretic Peptide: 4003 pg/mL — ABNORMAL HIGH (ref 0.0–100.0)

## 2018-05-26 NOTE — ED Provider Notes (Signed)
[place] EMERGENCY DEPARTMENT Provider Note   CSN: [id number] Arrival date & time: 05/26/18  2214     History   Chief Complaint Chief Complaint  Patient presents with  . Shortness of Breath    HPI [name] is a 77 y.o. female.  HPI  This is a 77 year old female with a history of end-stage renal disease on dialysis Mondays/Wednesdays/Friday, recent admission for anemia requiring blood transfusion as well as a NSTEMI and notable echo for reduced EF to 20% in early October who presents with shortness of breath and palpitations.  Patient reports that she has had increasing shortness of breath over the last several days.  She had an extra dialysis session for fluid overload.  She denies any chest pain, fever, cough.  She reports shortness of breath is worse with laying flat.  Upon EMS arrival, O2 sats were 86% on room air.  She was placed on supplemental oxygen.  She was in no acute distress.  She denies any dark tarry stools, blood in her stools.  I reviewed the patient's chart.  She has extensive coronary artery disease although her catheterization on October 4 did not show much change from prior with the exception of an LAD lesion.  EF reduced to 20 to 25%.  She did receive blood transfusion while in the hospital.  Past Medical History:  Diagnosis Date  . Anemia    Pt is taking iron.   [name] Anxiety   . Arthritis   . Carotid stenosis    40-59% bilateral ICA stenosis in 2/12.  . Chronic low back pain   . CKD (chronic kidney disease)    Dr. Audie Clear at [facility]  . Coronary artery disease    Pt presented 2/10 to [facility] with NSTEMI and diastolic CHF exacerbation.  LHC was done  3/10 showing 99% pRCA stenosis and 80% calcified pLAD stenosis with L=>R collaterals.  Pt was referred  for CABG which was done by Dr. Prescott Gum with LIMA-LAD, SVG-RCA, SVG-OM.  . Diabetes mellitus   . Diabetic neuropathy ([place])   . Diastolic CHF (HCC)    Echo (2/10)  showed EF 55-65%, mild LVH, diastolic dysfunction, mild AS with mean gradient 12 mmHg, PASP 43 mmHg.  Echo (2/12): EF 55-60%, mild LVH, mild AS (mean gradient 12), PA systolic pressure 32 mmHg.     [name] GERD (gastroesophageal reflux disease)   . Heart murmur   . Hyperlipidemia   . Hypertension   . Mild aortic stenosis    mean gradient 12 mmHg in 2/12.  . Myocardial infarction ([place])    "mild"  . Pneumonia   . PONV (postoperative nausea and vomiting)   . Thrombocytopenia ([place])   . Unsteady gait     Patient Active Problem List   Diagnosis Date Noted  . Pressure ulcer 05/19/2018  . Acute combined systolic and diastolic heart failure ([place]) 05/17/2018  . LBBB (left bundle branch block) 05/17/2018  . Aortic stenosis, moderate 05/17/2018  . NSTEMI (non-ST elevated myocardial infarction) ([place]) 05/16/2018  . Symptomatic anemia 05/15/2018  . Demand ischemia ([place]) 05/15/2018  . Protein-calorie malnutrition, severe 04/20/2018  . Pressure injury of skin 04/18/2018  . GI bleed 10/31/2016  . Blood loss anemia 10/31/2016  . AKI (acute kidney injury) ([place]) 10/31/2016  . CVA (cerebral vascular accident) ([place]) 09/26/2016  . ESRD (end stage renal disease) on dialysis ([place]) 06/23/2015  . Balance problem 12/22/2013  . Edema 12/28/2011  . CLAUDICATION 11/01/2010  . CAROTID  ARTERY STENOSIS 10/29/2009  . AORTIC STENOSIS 09/17/2009  . Essential hypertension 11/17/2008  . DIASTOLIC HEART FAILURE, CHRONIC 11/17/2008  . Diabetes mellitus type II, non insulin dependent ([place]) 11/12/2008  . Other and unspecified hyperlipidemia 11/12/2008  . Anemia, chronic renal failure 11/12/2008  . Thrombocytopenia ([place]) 11/12/2008  . Anxiety state 11/12/2008  . Coronary atherosclerosis 11/12/2008  . UNSPEC COMBINED SYSTOLIC&DIASTOLIC HEART FAILURE 84/13/2440  . ESOPHAGEAL REFLUX 11/12/2008  . Chronic kidney disease (CKD), stage V ([place]) 11/12/2008  . LOW BACK PAIN, CHRONIC 11/12/2008  . INSOMNIA UNSPECIFIED 11/12/2008      Past Surgical History:  Procedure Laterality Date  . AV FISTULA PLACEMENT Left 01/30/2015   Procedure: Creation of a Radial Cephalic AV Fistula left wrist;  Surgeon: [name], MD;  Location: [place];  Service: Vascular;  Laterality: Left;  . BACK SURGERY     multiple  . BREAST SURGERY     biopsy  . CARDIAC CATHETERIZATION    . CATARACT EXTRACTION W/ INTRAOCULAR LENS  IMPLANT, BILATERAL    . COLONOSCOPY Left 11/03/2016   Procedure: COLONOSCOPY;  Surgeon: [name], MD;  Location: [facility] ENDOSCOPY;  Service: Endoscopy;  Laterality: Left;  . COLONOSCOPY W/ BIOPSIES AND POLYPECTOMY    . CORONARY ARTERY BYPASS GRAFT  09/2008   pt with NSTEMI and diastolic CHF exacerbation.  LHC was done  3/10 showing 99% pRCA stenosis and 80% calcified pLAD stenosis with L=>R collaterals.  Pt was referred  for CABG which was done by Dr. Prescott Gum with LIMA-LAD, SVG-RCA, SVG-OM.  [name] ESOPHAGOGASTRODUODENOSCOPY N/A 11/02/2016   Procedure: ESOPHAGOGASTRODUODENOSCOPY (EGD);  Surgeon: [name], MD;  Location: [facility] ENDOSCOPY;  Service: Endoscopy;  Laterality: N/A;  . GIVENS CAPSULE STUDY N/A 11/29/2016   Procedure: GIVENS CAPSULE STUDY;  Surgeon: [name], MD;  Location: [place];  Service: Endoscopy;  Laterality: N/A;  . REVISON OF ARTERIOVENOUS FISTULA Left 07/02/2015   Procedure: REVISON OF LEFT RADIOCEPHALIC ARTERIOVENOUS FISTULA;  Surgeon: [name], MD;  Location: [place];  Service: Vascular;  Laterality: Left;  . RIGHT/LEFT HEART CATH AND CORONARY/GRAFT ANGIOGRAPHY N/A 05/18/2018   Procedure: RIGHT/LEFT HEART CATH AND CORONARY/GRAFT ANGIOGRAPHY;  Surgeon: Leonie Man, MD;  Location: [place] CV LAB;  Service: Cardiovascular;  Laterality: N/A;     OB History   None      Home Medications    Prior to Admission medications   Medication Sig Start Date End Date Taking? Authorizing Provider  ALPRAZolam Duanne Moron) 1 MG tablet Take 1 mg by mouth at bedtime.  11/18/17   [provider]   AMITIZA 24 MCG capsule Take 24 mcg by mouth See admin instructions. Take 24 mcg by mouth two times a day, every other day 06/05/17   [provider]  aspirin 81 MG tablet Take 1 tablet (81 mg total) by mouth daily. 05/19/18   Samuella Cota, MD  b complex-vitamin c-folic acid (NEPHRO-VITE) 0.8 MG TABS tablet Take 1 tablet by mouth daily. 05/01/18   [provider]  calcitRIOL (ROCALTROL) 0.5 MCG capsule Take 0.5 mcg by mouth daily. 04/09/18   [provider]  carvedilol (COREG) 12.5 MG tablet Take 1 tablet (12.5 mg total) by mouth every Tuesday, Thursday, Saturday, and Sunday. 05/19/18   Samuella Cota, MD  folic acid (FOLVITE) 1 MG tablet Take 1 tablet (1 mg total) by mouth daily. 04/24/18   [name], MD  losartan (COZAAR) 25 MG tablet Take 1 tablet (25 mg total) by mouth daily. 04/22/14  Larey Dresser, MD  rosuvastatin (CRESTOR) 5 MG tablet TAKE 1 TABLET(5 MG) BY MOUTH DAILY Patient taking differently: Take 5 mg by mouth at bedtime.  06/27/17   [name], NP  sevelamer carbonate (RENVELA) 800 MG tablet Take 1,600 mg 3 (three) times daily with meals by mouth.  12/06/16   [provider]  trimethoprim (TRIMPEX) 100 MG tablet Take 50 mg by mouth daily.  11/07/14   [provider]  vitamin B-12 (CYANOCOBALAMIN) 500 MCG tablet Take 1 tablet (500 mcg total) by mouth daily. 04/23/18   [name], MD  metoprolol succinate (TOPROL-XL) 50 MG 24 hr tablet Take 50 mg by mouth 2 (two) times daily. Take with or immediately following a meal.  05/15/18  [provider]    Family History Family History  Adopted: Yes  Family history unknown: Yes    Social History Social History   Tobacco Use  . Smoking status: Former Smoker    Types: Cigarettes  . Smokeless tobacco: Never Used  . Tobacco comment: quit 1988  Substance Use Topics  . Alcohol use: Yes    Comment: beer daily  . Drug use: No     Allergies   Doxycycline; Lipitor  [atorvastatin]; and Strawberry extract   Review of Systems Review of Systems  Constitutional: Negative for fever.  Respiratory: Positive for shortness of breath. Negative for cough and wheezing.   Cardiovascular: Negative for chest pain and leg swelling.  Gastrointestinal: Negative for abdominal pain, blood in stool, nausea and vomiting.  Genitourinary: Negative for dysuria.  All other systems reviewed and are negative.    Physical Exam Updated Vital Signs BP (!) 99/58   Pulse 72   Temp (!) 97.5 F (36.4 C) (Oral)   Resp 16   Ht 1.727 m (5\' 8" )   Wt 49 kg   SpO2 93%   BMI 16.43 kg/m   Physical Exam  Constitutional: She is oriented to person, place, and time.  Chronically ill-appearing but nontoxic, no acute distress  HENT:  Head: Normocephalic and atraumatic.  Eyes: Pupils are equal, round, and reactive to light.  Neck: Neck supple.  Cardiovascular: Normal rate, regular rhythm and normal heart sounds.  Pulmonary/Chest: Effort normal. No respiratory distress. She has no wheezes.  Fine crackles bilateral bases, tunneled catheter right upper chest  Abdominal: Soft. Bowel sounds are normal.  Musculoskeletal:       Right lower leg: She exhibits no edema.       Left lower leg: She exhibits no edema.  Neurological: She is alert and oriented to person, place, and time.  Skin: Skin is warm and dry.  Psychiatric: She has a normal mood and affect.  Nursing note and vitals reviewed.    ED Treatments / Results  Labs (all labs ordered are listed, but only abnormal results are displayed) Labs Reviewed  BASIC METABOLIC PANEL - Abnormal; Notable for the following components:      Result Value   Sodium 126 (*)    Chloride 94 (*)    Glucose, Bld 156 (*)    BUN 31 (*)    Creatinine, Ser 3.21 (*)    Calcium 8.5 (*)    GFR calc non Af Amer 13 (*)    GFR calc Af Amer 15 (*)    All other components within normal limits  CBC - Abnormal; Notable for the following components:    RBC 2.28 (*)    Hemoglobin 7.9 (*)    HCT 24.4 (*)  MCV 107.0 (*)    MCH 34.6 (*)    RDW 18.5 (*)    Platelets 121 (*)    All other components within normal limits  BRAIN NATRIURETIC PEPTIDE - Abnormal; Notable for the following components:   B Natriuretic Peptide 4,003.0 (*)    All other components within normal limits  I-STAT TROPONIN, ED - Abnormal; Notable for the following components:   Troponin i, poc 0.10 (*)    All other components within normal limits  TYPE AND SCREEN    EKG None  Radiology Dg Chest 2 View  Result Date: 05/26/2018 CLINICAL DATA:  77 year old female with history of increasing shortness of breath. Hypoxemia. EXAM: CHEST - 2 VIEW COMPARISON:  Chest x-ray 05/15/2018. FINDINGS: Right internal jugular PermCath with tip terminating at the superior cavoatrial junction. Status post median sternotomy for CABG. Lung volumes are normal. There is cephalization of the pulmonary vasculature and slight indistinctness of the interstitial markings suggestive of mild pulmonary edema. Small bilateral pleural effusions. Mild cardiomegaly. Upper mediastinal contours are within normal limits. Aortic atherosclerosis. IMPRESSION: 1. The appearance the chest suggests congestive heart failure, as above. 2. Aortic atherosclerosis. 3. Postoperative changes and support apparatus, as above. Electronically Signed   By: Vinnie Langton M.D.   On: 05/26/2018 23:12    Procedures Procedures (including critical care time)  Medications Ordered in ED Medications - No data to display   Initial Impression / Assessment and Plan / ED Course  I have reviewed the triage vital signs and the nursing notes.  Pertinent labs & imaging results that were available during my care of the patient were reviewed by me and considered in my medical decision making (see chart for details).     Presents with shortness of breath and orthopnea.  Recent NSTEMI with reduced EF.  Patient reports extra dialysis  session this morning.  She describes orthopnea.  EMS noted low O2 sats although she is not in any respiratory distress on my exam and her oxygen saturations are 94% on my evaluation.  She has some crackles but no overt volume overload.  However, chest x-ray does show evidence of pulmonary edema.  BNP is 4000.  Patient is also anemic with a hemoglobin of 7.9.  Discharge hemoglobin of 9.2.  She denies any acute episodes of bleeding.  She was started on aspirin at discharge.  Suspect that she likely needs dialysis urgently but not emergently.  She will need transfusion as well with dialysis.  EKG shows no significant changes from prior.  Troponin is 0.1.  This is down trended from prior.  Discussed with the hospitalist who will plan to admit the patient.  Given the patient is in no acute distress and her blood pressure is soft, nitroglycerin was not given.    Final Clinical Impressions(s) / ED Diagnoses   Final diagnoses:  Acute systolic congestive heart failure (HCC)  Anemia, unspecified type    ED Discharge Orders    None       Dina Rich, Barbette Hair, MD 05/27/18 [phone]

## 2018-05-26 NOTE — ED Notes (Signed)
I Stat Trop I results of 0.10 reported to Dr. Zenia Resides

## 2018-05-26 NOTE — ED Triage Notes (Signed)
Pt bib [facility] from home d/t shob.  Pt was released from hospital one week ago s/p MI.  Pt reported an increase in shob causing her to present.  Pt was initially 86% on RA, EMS placed her of 4L of O2 w/ improvement in sat to 100%.  Pt currently 96% on RA.  Pt is a dialysis MWF, center had her come in today for an extra dialysis d/t increased fluid retention.

## 2018-05-27 ENCOUNTER — Observation Stay (HOSPITAL_COMMUNITY): Payer: Medicare HMO

## 2018-05-27 ENCOUNTER — Other Ambulatory Visit: Payer: Self-pay

## 2018-05-27 ENCOUNTER — Encounter (HOSPITAL_COMMUNITY): Payer: Self-pay | Admitting: Internal Medicine

## 2018-05-27 DIAGNOSIS — E8770 Fluid overload, unspecified: Secondary | ICD-10-CM | POA: Insufficient documentation

## 2018-05-27 DIAGNOSIS — K219 Gastro-esophageal reflux disease without esophagitis: Secondary | ICD-10-CM | POA: Diagnosis present

## 2018-05-27 DIAGNOSIS — E8779 Other fluid overload: Secondary | ICD-10-CM

## 2018-05-27 DIAGNOSIS — I472 Ventricular tachycardia: Secondary | ICD-10-CM | POA: Diagnosis not present

## 2018-05-27 DIAGNOSIS — T17920A Food in respiratory tract, part unspecified causing asphyxiation, initial encounter: Secondary | ICD-10-CM

## 2018-05-27 DIAGNOSIS — Z681 Body mass index (BMI) 19 or less, adult: Secondary | ICD-10-CM | POA: Diagnosis not present

## 2018-05-27 DIAGNOSIS — I2510 Atherosclerotic heart disease of native coronary artery without angina pectoris: Secondary | ICD-10-CM | POA: Diagnosis present

## 2018-05-27 DIAGNOSIS — I252 Old myocardial infarction: Secondary | ICD-10-CM | POA: Diagnosis not present

## 2018-05-27 DIAGNOSIS — R0603 Acute respiratory distress: Secondary | ICD-10-CM | POA: Diagnosis present

## 2018-05-27 DIAGNOSIS — Z7189 Other specified counseling: Secondary | ICD-10-CM | POA: Diagnosis not present

## 2018-05-27 DIAGNOSIS — N186 End stage renal disease: Secondary | ICD-10-CM | POA: Diagnosis not present

## 2018-05-27 DIAGNOSIS — E43 Unspecified severe protein-calorie malnutrition: Secondary | ICD-10-CM | POA: Diagnosis not present

## 2018-05-27 DIAGNOSIS — D631 Anemia in chronic kidney disease: Secondary | ICD-10-CM | POA: Diagnosis present

## 2018-05-27 DIAGNOSIS — R918 Other nonspecific abnormal finding of lung field: Secondary | ICD-10-CM | POA: Diagnosis not present

## 2018-05-27 DIAGNOSIS — Z4682 Encounter for fitting and adjustment of non-vascular catheter: Secondary | ICD-10-CM | POA: Diagnosis not present

## 2018-05-27 DIAGNOSIS — N2581 Secondary hyperparathyroidism of renal origin: Secondary | ICD-10-CM | POA: Diagnosis not present

## 2018-05-27 DIAGNOSIS — E1122 Type 2 diabetes mellitus with diabetic chronic kidney disease: Secondary | ICD-10-CM | POA: Diagnosis present

## 2018-05-27 DIAGNOSIS — Z515 Encounter for palliative care: Secondary | ICD-10-CM | POA: Diagnosis not present

## 2018-05-27 DIAGNOSIS — Z992 Dependence on renal dialysis: Secondary | ICD-10-CM | POA: Diagnosis not present

## 2018-05-27 DIAGNOSIS — J690 Pneumonitis due to inhalation of food and vomit: Secondary | ICD-10-CM | POA: Diagnosis not present

## 2018-05-27 DIAGNOSIS — N185 Chronic kidney disease, stage 5: Secondary | ICD-10-CM | POA: Diagnosis not present

## 2018-05-27 DIAGNOSIS — Z66 Do not resuscitate: Secondary | ICD-10-CM | POA: Diagnosis not present

## 2018-05-27 DIAGNOSIS — J90 Pleural effusion, not elsewhere classified: Secondary | ICD-10-CM | POA: Diagnosis not present

## 2018-05-27 DIAGNOSIS — J9601 Acute respiratory failure with hypoxia: Secondary | ICD-10-CM | POA: Diagnosis not present

## 2018-05-27 DIAGNOSIS — G9340 Encephalopathy, unspecified: Secondary | ICD-10-CM | POA: Diagnosis not present

## 2018-05-27 DIAGNOSIS — G8929 Other chronic pain: Secondary | ICD-10-CM | POA: Diagnosis present

## 2018-05-27 DIAGNOSIS — E119 Type 2 diabetes mellitus without complications: Secondary | ICD-10-CM | POA: Diagnosis not present

## 2018-05-27 DIAGNOSIS — F419 Anxiety disorder, unspecified: Secondary | ICD-10-CM | POA: Diagnosis present

## 2018-05-27 DIAGNOSIS — M545 Low back pain: Secondary | ICD-10-CM | POA: Diagnosis present

## 2018-05-27 DIAGNOSIS — I132 Hypertensive heart and chronic kidney disease with heart failure and with stage 5 chronic kidney disease, or end stage renal disease: Secondary | ICD-10-CM | POA: Diagnosis present

## 2018-05-27 DIAGNOSIS — E785 Hyperlipidemia, unspecified: Secondary | ICD-10-CM | POA: Diagnosis present

## 2018-05-27 DIAGNOSIS — I5041 Acute combined systolic (congestive) and diastolic (congestive) heart failure: Secondary | ICD-10-CM

## 2018-05-27 DIAGNOSIS — Z961 Presence of intraocular lens: Secondary | ICD-10-CM | POA: Diagnosis present

## 2018-05-27 DIAGNOSIS — I5021 Acute systolic (congestive) heart failure: Secondary | ICD-10-CM | POA: Diagnosis not present

## 2018-05-27 DIAGNOSIS — E1140 Type 2 diabetes mellitus with diabetic neuropathy, unspecified: Secondary | ICD-10-CM | POA: Diagnosis present

## 2018-05-27 DIAGNOSIS — I10 Essential (primary) hypertension: Secondary | ICD-10-CM

## 2018-05-27 DIAGNOSIS — I5043 Acute on chronic combined systolic (congestive) and diastolic (congestive) heart failure: Secondary | ICD-10-CM | POA: Diagnosis not present

## 2018-05-27 DIAGNOSIS — R0600 Dyspnea, unspecified: Secondary | ICD-10-CM | POA: Diagnosis present

## 2018-05-27 DIAGNOSIS — Z7984 Long term (current) use of oral hypoglycemic drugs: Secondary | ICD-10-CM | POA: Diagnosis not present

## 2018-05-27 DIAGNOSIS — I120 Hypertensive chronic kidney disease with stage 5 chronic kidney disease or end stage renal disease: Secondary | ICD-10-CM | POA: Diagnosis not present

## 2018-05-27 LAB — RENAL FUNCTION PANEL
Albumin: 3.3 g/dL — ABNORMAL LOW (ref 3.5–5.0)
Anion gap: 10 (ref 5–15)
BUN: 33 mg/dL — AB (ref 8–23)
CO2: 22 mmol/L (ref 22–32)
Calcium: 9 mg/dL (ref 8.9–10.3)
Chloride: 93 mmol/L — ABNORMAL LOW (ref 98–111)
Creatinine, Ser: 3.51 mg/dL — ABNORMAL HIGH (ref 0.44–1.00)
GFR calc Af Amer: 14 mL/min — ABNORMAL LOW (ref >60)
GFR calc non Af Amer: 12 mL/min — ABNORMAL LOW (ref >60)
GLUCOSE: 202 mg/dL — AB (ref 70–99)
PHOSPHORUS: 3.7 mg/dL (ref 2.5–4.6)
POTASSIUM: 5 mmol/L (ref 3.5–5.1)
SODIUM: 125 mmol/L — AB (ref 135–145)

## 2018-05-27 LAB — GLUCOSE, CAPILLARY
GLUCOSE-CAPILLARY: 125 mg/dL — AB (ref 70–99)
Glucose-Capillary: 118 mg/dL — ABNORMAL HIGH (ref 70–99)
Glucose-Capillary: 208 mg/dL — ABNORMAL HIGH (ref 70–99)

## 2018-05-27 LAB — MRSA PCR SCREENING: MRSA by PCR: POSITIVE — AB

## 2018-05-27 LAB — MAGNESIUM: Magnesium: 2.2 mg/dL (ref 1.7–2.4)

## 2018-05-27 MED ORDER — CALCITRIOL 0.25 MCG PO CAPS
0.5000 ug | ORAL_CAPSULE | Freq: Every day | ORAL | Status: DC
  Administered 2018-05-28 – 2018-05-30 (×3): 0.5 ug via ORAL
  Filled 2018-05-27 (×3): qty 2

## 2018-05-27 MED ORDER — GERHARDT'S BUTT CREAM
TOPICAL_CREAM | Freq: Four times a day (QID) | CUTANEOUS | Status: DC | PRN
  Filled 2018-05-27: qty 1

## 2018-05-27 MED ORDER — FUROSEMIDE 10 MG/ML IJ SOLN
INTRAMUSCULAR | Status: AC
  Filled 2018-05-27: qty 4

## 2018-05-27 MED ORDER — VANCOMYCIN HCL IN DEXTROSE 500-5 MG/100ML-% IV SOLN
500.0000 mg | INTRAVENOUS | Status: DC
  Filled 2018-05-27: qty 100

## 2018-05-27 MED ORDER — SODIUM CHLORIDE 0.9 % IV SOLN
1.0000 g | Freq: Once | INTRAVENOUS | Status: AC
  Administered 2018-05-27: 1 g via INTRAVENOUS
  Filled 2018-05-27 (×2): qty 1

## 2018-05-27 MED ORDER — VITAMIN B-12 1000 MCG PO TABS
500.0000 ug | ORAL_TABLET | Freq: Every day | ORAL | Status: DC
  Administered 2018-05-28 – 2018-05-30 (×3): 500 ug via ORAL
  Filled 2018-05-27 (×3): qty 1

## 2018-05-27 MED ORDER — HEPARIN SODIUM (PORCINE) 1000 UNIT/ML IJ SOLN
INTRAMUSCULAR | Status: AC
  Filled 2018-05-27: qty 1

## 2018-05-27 MED ORDER — INSULIN ASPART 100 UNIT/ML ~~LOC~~ SOLN
0.0000 [IU] | Freq: Three times a day (TID) | SUBCUTANEOUS | Status: DC
  Administered 2018-05-27 – 2018-05-28 (×2): 1 [IU] via SUBCUTANEOUS
  Administered 2018-05-29: 3 [IU] via SUBCUTANEOUS
  Administered 2018-05-29 – 2018-05-30 (×3): 1 [IU] via SUBCUTANEOUS

## 2018-05-27 MED ORDER — SODIUM CHLORIDE 0.9% FLUSH
3.0000 mL | INTRAVENOUS | Status: DC | PRN
  Administered 2018-05-28: 3 mL via INTRAVENOUS
  Filled 2018-05-27: qty 3

## 2018-05-27 MED ORDER — FOLIC ACID 1 MG PO TABS
1.0000 mg | ORAL_TABLET | Freq: Every day | ORAL | Status: DC
  Administered 2018-05-28 – 2018-05-30 (×3): 1 mg via ORAL
  Filled 2018-05-27 (×3): qty 1

## 2018-05-27 MED ORDER — ALPRAZOLAM 0.5 MG PO TABS
1.0000 mg | ORAL_TABLET | Freq: Every evening | ORAL | Status: DC | PRN
  Administered 2018-05-28 – 2018-05-29 (×2): 1 mg via ORAL
  Filled 2018-05-27 (×3): qty 2

## 2018-05-27 MED ORDER — MUPIROCIN 2 % EX OINT
1.0000 "application " | TOPICAL_OINTMENT | Freq: Two times a day (BID) | CUTANEOUS | Status: DC
  Administered 2018-05-27 – 2018-05-30 (×7): 1 via NASAL
  Filled 2018-05-27: qty 22

## 2018-05-27 MED ORDER — ONDANSETRON HCL 4 MG/2ML IJ SOLN
4.0000 mg | Freq: Four times a day (QID) | INTRAMUSCULAR | Status: DC | PRN
  Administered 2018-05-27: 4 mg via INTRAVENOUS
  Filled 2018-05-27: qty 2

## 2018-05-27 MED ORDER — CHLORHEXIDINE GLUCONATE CLOTH 2 % EX PADS
6.0000 | MEDICATED_PAD | Freq: Every day | CUTANEOUS | Status: DC
  Administered 2018-05-27: 6 via TOPICAL

## 2018-05-27 MED ORDER — SODIUM CHLORIDE 0.9 % IV SOLN: 250.0000 mL | INTRAVENOUS | Status: DC | PRN

## 2018-05-27 MED ORDER — HEPARIN SODIUM (PORCINE) 1000 UNIT/ML IJ SOLN
INTRAMUSCULAR | Status: AC
  Filled 2018-05-27: qty 4

## 2018-05-27 MED ORDER — CHLORHEXIDINE GLUCONATE CLOTH 2 % EX PADS
6.0000 | MEDICATED_PAD | Freq: Every day | CUTANEOUS | Status: DC
  Administered 2018-05-27 – 2018-05-28 (×2): 6 via TOPICAL

## 2018-05-27 MED ORDER — ACETAMINOPHEN 325 MG PO TABS: 650.0000 mg | ORAL_TABLET | ORAL | Status: DC | PRN

## 2018-05-27 MED ORDER — ROSUVASTATIN CALCIUM 10 MG PO TABS
5.0000 mg | ORAL_TABLET | Freq: Every day | ORAL | Status: DC
  Administered 2018-05-28 – 2018-05-29 (×2): 5 mg via ORAL
  Filled 2018-05-27 (×2): qty 1

## 2018-05-27 MED ORDER — HEPARIN SODIUM (PORCINE) 1000 UNIT/ML DIALYSIS
1000.0000 [IU] | Freq: Once | INTRAMUSCULAR | Status: AC
  Administered 2018-05-27: 1000 [IU] via INTRAVENOUS_CENTRAL

## 2018-05-27 MED ORDER — TRIMETHOPRIM 100 MG PO TABS
50.0000 mg | ORAL_TABLET | Freq: Every day | ORAL | Status: DC
  Administered 2018-05-28 – 2018-05-30 (×3): 50 mg via ORAL
  Filled 2018-05-27 (×4): qty 1

## 2018-05-27 MED ORDER — SEVELAMER CARBONATE 800 MG PO TABS
1600.0000 mg | ORAL_TABLET | Freq: Three times a day (TID) | ORAL | Status: DC
  Administered 2018-05-28 – 2018-05-30 (×6): 1600 mg via ORAL
  Filled 2018-05-27 (×6): qty 2

## 2018-05-27 MED ORDER — FUROSEMIDE 10 MG/ML IJ SOLN
INTRAMUSCULAR | Status: AC
  Filled 2018-05-27: qty 2

## 2018-05-27 MED ORDER — VANCOMYCIN HCL 10 G IV SOLR
1250.0000 mg | Freq: Once | INTRAVENOUS | Status: AC
  Administered 2018-05-27: 1250 mg via INTRAVENOUS
  Filled 2018-05-27: qty 1250

## 2018-05-27 MED ORDER — HEPARIN SODIUM (PORCINE) 1000 UNIT/ML IJ SOLN
3.2000 mL | Freq: Once | INTRAMUSCULAR | Status: AC
  Administered 2018-05-27: 3200 [IU] via INTRAVENOUS

## 2018-05-27 MED ORDER — DEXTROSE 5 % IV SOLN: 500.0000 mg | INTRAVENOUS | Status: DC

## 2018-05-27 MED ORDER — SODIUM CHLORIDE 0.9% FLUSH
3.0000 mL | Freq: Two times a day (BID) | INTRAVENOUS | Status: DC
  Administered 2018-05-27 – 2018-05-30 (×8): 3 mL via INTRAVENOUS

## 2018-05-27 NOTE — Progress Notes (Signed)
Pharmacy Antibiotic Note  [name] is a 77 y.o. female admitted on 05/26/2018 with possible pneumonia. Patient has ESRD on HD MWF with last HD on 10/12. Pharmacy has been consulted for cefepime and vancomycin dosing.  Patient currently afebrile, WBC wnl  Plan: Vancomycin 1250mg  x1 dose now Re-dose Vancomycin 500mg  after HD  F/U on HD schedule to re-dose vancomycin Cefepime 1g x1 dose today then 500mg  every 24hr Continue to monitor HD schedule, cultures, and patient clinical status.    Height: 5\' 8"  (172.7 cm) Weight: 122 lb 9.6 oz (55.6 kg) IBW/kg (Calculated) : 63.9  Temp (24hrs), Avg:97.7 F (36.5 C), Min:97.5 F (36.4 C), Max:98 F (36.7 C)  Recent Labs  Lab 05/26/18 2236 05/27/18 1031  WBC 9.0  --   CREATININE 3.21* 3.51*    Estimated Creatinine Clearance: 12 mL/min (A) (by C-G formula based on SCr of 3.51 mg/dL (H)).    Allergies  Allergen Reactions  . Doxycycline Nausea And Vomiting    Caused "DEATHLY NAUSEA AND VOMITING"  . Lipitor [Atorvastatin] Other (See Comments)    Stomach pain  . Strawberry Extract Rash    Antimicrobials this admission: Vancomycin (10/13)>>  Cefepime (10/13) >>    Microbiology results: 10/13 BCx:  10/13 Sputum:  10/13 MRSA PCR: positive  Thank you for allowing pharmacy to be a part of this patient's care.  Gwenlyn Found, Sherian Rein D PGY1 Pharmacy Resident  Phone [phone] 05/27/2018   12:42 PM

## 2018-05-27 NOTE — Progress Notes (Addendum)
[facility] Progress Note   Subjective: feeling rough today, SOB , just readmitted after acute pulm edema admissoin      Objective Vitals:   05/27/18 0045 05/27/18 0100 05/27/18 0145 05/27/18 0236  BP: (!) 106/53 (!) 109/57 (!) 96/52 119/63  Pulse: 69 72 69 79  Resp: (!) 25 (!) 27 15 18   Temp:    97.7 F (36.5 C)  TempSrc:    Oral  SpO2: 95% 97% 95% 98%  Weight:    55.6 kg  Height:        Physical Exam General: Frail elderly female NAD, retching, some ^wob Neck: +jvd Heart: RRR 2/6 SEM Lungs: bilat rales no wheezing Abdomen: soft NT/ND Extremities: 1+ LE edema Dialysis Access: RIJ TDC; L A AVF no bruit   Home meds: - alprazolam 1 mg hs - aspirin 81/ rosuvastatin 5 hs - losartan 25 qd/ carvediolol 12.5 TTSS - vitamins/ prns/ supplements - sevelamer carb 1.6gm tid  - trimethoprim 50 qd   Dialysis:[facility] MWF 4h  49kg  2/2.25  Hep 1000   LFA AVF (no bruit)/ R IJ TDC Hectorol 2 mcg  Mircera 100 mcg (last 10/07   Assessment/Plan: 1. SOB/ pulm edema/ vol overload - 6kg up by wts, d/w patient need for strict fluid intake control.  Needs HD today and tomorrow, get vol down.  Low BP's an issue, likely due to CM EF 20%. She is not doing well so far on dialysis ,frail and poor cardiac function.   2. ESRD -HD MWF. AVF unable to be salvaged, using R IJ TDC. To f/u [facility] for new access as outpatient. Next HD 10/7  3. Symptomatic Anemia - Hgb stable s/p 2U prbcs on 10/1  +FOBT 4. HTN/ CM EF 20-25%. BP's too low for ARB and BB therapy, will prob have to forego these. Hold for now. May need midodrine.   5. MBD - Ca/Phos ok. Continue VDRA. No binders here.  6. CAD/ s/p 3V CABG 2010 7. AS - not severe by heart cath 8. Gen weakness - multifactorial 2/2 to above dx    Kelly Splinter MD [facility] pager [phone]   05/27/2018, 9:34 AM     Additional Objective Labs: Basic Metabolic Panel: LastLabs     Recent Labs  Lab  05/26/18 2236  NA 126*  K 4.6  CL 94*  CO2 24  GLUCOSE 156*  BUN 31*  CREATININE 3.21*  CALCIUM 8.5*     CBC: LastLabs     Recent Labs  Lab 05/26/18 2236  WBC 9.0  HGB 7.9*  HCT 24.4*  MCV 107.0*  PLT 121*     Blood Culture Labs(Brief)          Component Value Date/Time   SDES BLOOD RIGHT HAND 10/08/2008 1530   SPECREQUEST BOTTLES DRAWN AEROBIC AND ANAEROBIC 10CC 10/08/2008 1530   CULT NO GROWTH 5 DAYS 10/08/2008 1530   REPTSTATUS 10/15/2008 FINAL 10/08/2008 1530      Cardiac Enzymes: LastLabs  No results for input(s): CKTOTAL, CKMB, CKMBINDEX, TROPONINI in the last 168 hours.   CBG: LastLabs      Recent Labs  Lab 05/27/18 0240 05/27/18 0746  GLUCAP 118* 125*     Iron Studies:  RecentLabs(last2labs)  No results for input(s): IRON, TIBC, TRANSFERRIN, FERRITIN in the last 72 hours.   RecentLabs       Lab Results  Component Value Date   INR 1.07 04/18/2018   INR 1.4 10/16/2008   INR 1.1 10/08/2008  Medications: . sodium chloride     . Chlorhexidine Gluconate Cloth  6 each Topical Q0600  . insulin aspart  0-9 Units Subcutaneous TID WC  . mupirocin ointment  1 application Nasal BID  . sodium chloride flush  3 mL Intravenous Q12H

## 2018-05-27 NOTE — Progress Notes (Signed)
Patient became unreponsive at same time as VTach rhythm on tele monitor around 10:30 this am.  Pt was placed on nonbreather and suctioned and came around.  Pt is limited code (no CPR, no intubation).  She is doing better now. Have d/w patient and her husband pt is high risk for dialysis complications, including resp/ cardiac arrest , they understand and agree to proceed notwithstanding.   Kelly Splinter MD [employer] 05/27/2018, 1:00 PM

## 2018-05-27 NOTE — ED Notes (Signed)
Report  Called to rn on 3w

## 2018-05-27 NOTE — H&P (Addendum)
History and Physical    [name] OIN:[id number] DOB: Jul 06, 1941 DOA: 05/26/2018  PCP: [name], MD   Patient coming from: Home  I have personally briefly reviewed patient's old medical records in [place]  Chief Complaint: Shortness of breath  HPI: [name] is a 77 y.o. female with medical history significant of end-stage renal disease on hemodialysis, coronary artery disease, type 2 diabetes, CHF presents with shortness of breath.  Patient actually underwent extra dialysis today.  He states that he removed about 2 pounds up.  She continued to have orthopnea and shortness of breath.  Denies any fever.  She had a recent hospitalizations for a GI bleed status post blood transfusion.  Hemoglobin today is down to 7.9.  Had a cardiac catheterization earlier this month that showed extensive 2-vessel coronary artery disease.  recommendation is to continue medical management.  She did have a new drop in her EF to 20-25%.  Cardiology did not think it was ischemic in nature.  ED Course: Chest x-ray showed volume overload.  Review of Systems: Positive for anorexia no abdominal pain positive for vomiting x1 today. All others reviewed with patient  and are  negative unless otherwise stated    Past Medical History:  Diagnosis Date  . Anemia    Pt is taking iron.   [name] Anxiety   . Arthritis   . Carotid stenosis    40-59% bilateral ICA stenosis in 2/12.  . Chronic low back pain   . CKD (chronic kidney disease)    Dr. Audie Clear at [facility]  . Coronary artery disease    Pt presented 2/10 to [facility] with NSTEMI and diastolic CHF exacerbation.  LHC was done  3/10 showing 99% pRCA stenosis and 80% calcified pLAD stenosis with L=>R collaterals.  Pt was referred  for CABG which was done by Dr. Prescott Gum with LIMA-LAD, SVG-RCA, SVG-OM.  . Diabetes mellitus   . Diabetic neuropathy ([place])   . Diastolic CHF (HCC)    Echo (2/10) showed EF 55-65%, mild LVH, diastolic  dysfunction, mild AS with mean gradient 12 mmHg, PASP 43 mmHg.  Echo (2/12): EF 55-60%, mild LVH, mild AS (mean gradient 12), PA systolic pressure 32 mmHg.     [name] GERD (gastroesophageal reflux disease)   . Heart murmur   . Hyperlipidemia   . Hypertension   . Mild aortic stenosis    mean gradient 12 mmHg in 2/12.  . Myocardial infarction ([place])    "mild"  . Pneumonia   . PONV (postoperative nausea and vomiting)   . Thrombocytopenia ([place])   . Unsteady gait     Past Surgical History:  Procedure Laterality Date  . AV FISTULA PLACEMENT Left 01/30/2015   Procedure: Creation of a Radial Cephalic AV Fistula left wrist;  Surgeon: [name], MD;  Location: [place];  Service: Vascular;  Laterality: Left;  . BACK SURGERY     multiple  . BREAST SURGERY     biopsy  . CARDIAC CATHETERIZATION    . CATARACT EXTRACTION W/ INTRAOCULAR LENS  IMPLANT, BILATERAL    . COLONOSCOPY Left 11/03/2016   Procedure: COLONOSCOPY;  Surgeon: [name], MD;  Location: [facility] ENDOSCOPY;  Service: Endoscopy;  Laterality: Left;  . COLONOSCOPY W/ BIOPSIES AND POLYPECTOMY    . CORONARY ARTERY BYPASS GRAFT  09/2008   pt with NSTEMI and diastolic CHF exacerbation.  LHC was done  3/10 showing 99% pRCA stenosis and 80% calcified pLAD stenosis with L=>R  collaterals.  Pt was referred  for CABG which was done by Dr. Prescott Gum with LIMA-LAD, SVG-RCA, SVG-OM.  [name] ESOPHAGOGASTRODUODENOSCOPY N/A 11/02/2016   Procedure: ESOPHAGOGASTRODUODENOSCOPY (EGD);  Surgeon: [name], MD;  Location: [facility] ENDOSCOPY;  Service: Endoscopy;  Laterality: N/A;  . GIVENS CAPSULE STUDY N/A 11/29/2016   Procedure: GIVENS CAPSULE STUDY;  Surgeon: [name], MD;  Location: [place];  Service: Endoscopy;  Laterality: N/A;  . REVISON OF ARTERIOVENOUS FISTULA Left 07/02/2015   Procedure: REVISON OF LEFT RADIOCEPHALIC ARTERIOVENOUS FISTULA;  Surgeon: [name], MD;  Location: [place];  Service: Vascular;  Laterality: Left;  . RIGHT/LEFT HEART CATH AND  CORONARY/GRAFT ANGIOGRAPHY N/A 05/18/2018   Procedure: RIGHT/LEFT HEART CATH AND CORONARY/GRAFT ANGIOGRAPHY;  Surgeon: Leonie Man, MD;  Location: [place] CV LAB;  Service: Cardiovascular;  Laterality: N/A;     reports that she has quit smoking. Her smoking use included cigarettes. She has never used smokeless tobacco. She reports that she drinks alcohol. She reports that she does not use drugs.  Allergies  Allergen Reactions  . Doxycycline Nausea And Vomiting    Caused "DEATHLY NAUSEA AND VOMITING"  . Lipitor [Atorvastatin] Other (See Comments)    Stomach pain  . Strawberry Extract Rash    Family History  Adopted: Yes  Family history unknown: Yes    Prior to Admission medications   Medication Sig Start Date End Date Taking? Authorizing Provider  ALPRAZolam Duanne Moron) 1 MG tablet Take 1 mg by mouth at bedtime.  11/18/17  Yes [provider]  AMITIZA 24 MCG capsule Take 24 mcg by mouth See admin instructions. Take 24 mcg by mouth two times a day, every other day 06/05/17  Yes [provider]  aspirin 81 MG tablet Take 1 tablet (81 mg total) by mouth daily. 05/19/18  Yes Samuella Cota, MD  b complex-vitamin c-folic acid (NEPHRO-VITE) 0.8 MG TABS tablet Take 1 tablet by mouth daily. 05/01/18  Yes [provider]  calcitRIOL (ROCALTROL) 0.5 MCG capsule Take 0.5 mcg by mouth daily. 04/09/18  Yes [provider]  carvedilol (COREG) 12.5 MG tablet Take 1 tablet (12.5 mg total) by mouth every Tuesday, Thursday, Saturday, and Sunday. 05/19/18  Yes Samuella Cota, MD  folic acid (FOLVITE) 1 MG tablet Take 1 tablet (1 mg total) by mouth daily. 04/24/18  Yes [name], MD  losartan (COZAAR) 25 MG tablet Take 1 tablet (25 mg total) by mouth daily. 04/22/14  Yes Larey Dresser, MD  rosuvastatin (CRESTOR) 5 MG tablet TAKE 1 TABLET(5 MG) BY MOUTH DAILY Patient taking differently: Take 5 mg by mouth at bedtime.  06/27/17  Yes [name], NP    sevelamer carbonate (RENVELA) 800 MG tablet Take 1,600 mg 3 (three) times daily with meals by mouth.  12/06/16  Yes [provider]  trimethoprim (TRIMPEX) 100 MG tablet Take 50 mg by mouth daily.  11/07/14  Yes [provider]  vitamin B-12 (CYANOCOBALAMIN) 500 MCG tablet Take 1 tablet (500 mcg total) by mouth daily. 04/23/18  Yes [name], MD  metoprolol succinate (TOPROL-XL) 50 MG 24 hr tablet Take 50 mg by mouth 2 (two) times daily. Take with or immediately following a meal.  05/15/18  [provider]    Physical Exam: Vitals:   05/27/18 0015 05/27/18 0030 05/27/18 0045 05/27/18 0100  BP: 114/61 113/76 (!) 106/53 (!) 109/57  Pulse: 73 73 69 72  Resp: 18 19 (!) 25 (!) 27  Temp:  TempSrc:      SpO2: 96% 96% 95% 97%  Weight:      Height:        Constitutional: NAD, calm, comfortable, sleepy, weak appearing Vitals:   05/27/18 0015 05/27/18 0030 05/27/18 0045 05/27/18 0100  BP: 114/61 113/76 (!) 106/53 (!) 109/57  Pulse: 73 73 69 72  Resp: 18 19 (!) 25 (!) 27  Temp:      TempSrc:      SpO2: 96% 96% 95% 97%  Weight:      Height:       Eyes: , lids and conjunctivae normal ENMT: Mucous membranes are moist. Posterior pharynx clear of any exudate or lesions.Normal dentition.  Neck: normal, supple, no masses,  Respiratory: clear to auscultation bilaterally, no wheezing, no crackles. Normal respiratory effort. No accessory muscle use.  Cardiovascular: Regular rate and rhythm,  No extremity edema. 1+ pedal pulses.  Chest. HD cath right chest  Abdomen: no tenderness, no masses palpated. No hepatosplenomegaly. Bowel sounds positive.  Musculoskeletal: no clubbing / cyanosis. No joint deformity upper and lower extremities.  no contractures.  Skin: no rashes, lesions, ulcers. No induration Neurologic: CN 2-12 grossly intact. [name]  gen weak moves all ext equally  Psychiatric: Normal judgment and insight. Alert and oriented x 3. Normal mood.     Labs on  Admission: I have personally reviewed following labs and imaging studies  CBC: Recent Labs  Lab 05/26/18 2236  WBC 9.0  HGB 7.9*  HCT 24.4*  MCV 107.0*  PLT 767*   Basic Metabolic Panel: Recent Labs  Lab 05/26/18 2236  NA 126*  K 4.6  CL 94*  CO2 24  GLUCOSE 156*  BUN 31*  CREATININE 3.21*  CALCIUM 8.5*   GFR: Estimated Creatinine Clearance: 11.5 mL/min (A) (by C-G formula based on SCr of 3.21 mg/dL (H)). Liver Function Tests: No results for input(s): AST, ALT, ALKPHOS, BILITOT, PROT, ALBUMIN in the last 168 hours. No results for input(s): LIPASE, AMYLASE in the last 168 hours. No results for input(s): AMMONIA in the last 168 hours. Coagulation Profile: No results for input(s): INR, PROTIME in the last 168 hours. Cardiac Enzymes: No results for input(s): CKTOTAL, CKMB, CKMBINDEX, TROPONINI in the last 168 hours. BNP (last 3 results) No results for input(s): PROBNP in the last 8760 hours. HbA1C: No results for input(s): HGBA1C in the last 72 hours. CBG: No results for input(s): GLUCAP in the last 168 hours. Lipid Profile: No results for input(s): CHOL, HDL, LDLCALC, TRIG, CHOLHDL, LDLDIRECT in the last 72 hours. Thyroid Function Tests: No results for input(s): TSH, T4TOTAL, FREET4, T3FREE, THYROIDAB in the last 72 hours. Anemia Panel: No results for input(s): VITAMINB12, FOLATE, FERRITIN, TIBC, IRON, RETICCTPCT in the last 72 hours. Urine analysis:    Component Value Date/Time   COLORURINE YELLOW 09/26/2016 1744   APPEARANCEUR TURBID (A) 09/26/2016 1744   LABSPEC 1.005 09/26/2016 1744   PHURINE 5.0 09/26/2016 1744   GLUCOSEU NEGATIVE 09/26/2016 1744   HGBUR SMALL (A) 09/26/2016 1744   BILIRUBINUR NEGATIVE 09/26/2016 1744   KETONESUR NEGATIVE 09/26/2016 1744   PROTEINUR 100 (A) 09/26/2016 1744   UROBILINOGEN 0.2 10/14/2008 0612   NITRITE NEGATIVE 09/26/2016 1744   LEUKOCYTESUR LARGE (A) 09/26/2016 1744    Radiological Exams on Admission: Dg Chest 2  View  Result Date: 05/26/2018 CLINICAL DATA:  77 year old female with history of increasing shortness of breath. Hypoxemia. EXAM: CHEST - 2 VIEW COMPARISON:  Chest x-ray 05/15/2018. FINDINGS: Right internal jugular PermCath with tip  terminating at the superior cavoatrial junction. Status post median sternotomy for CABG. Lung volumes are normal. There is cephalization of the pulmonary vasculature and slight indistinctness of the interstitial markings suggestive of mild pulmonary edema. Small bilateral pleural effusions. Mild cardiomegaly. Upper mediastinal contours are within normal limits. Aortic atherosclerosis. IMPRESSION: 1. The appearance the chest suggests congestive heart failure, as above. 2. Aortic atherosclerosis. 3. Postoperative changes and support apparatus, as above. Electronically Signed   By: Vinnie Langton M.D.   On: 05/26/2018 23:12    Assessment/Plan Principal Problem:   Volume overload Active Problems:   Diabetes mellitus type II, non insulin dependent (HCC)   Anemia, chronic renal failure   Essential hypertension   ESRD (end stage renal disease) on dialysis [facility])   Acute combined systolic and diastolic heart failure (HCC) EF 20 to 25% recent heart cath October 2019 Mildly elevated troponin.   CAD Low gradient aortic stenosis  -Consult nephrology as patient needs hemodialysis as she presents with volume overload despite her extra  dialysis on yesterday. -Carb consistent diet, sliding scale insulin , A1c 6, 12 days ago -Hemoglobin down to 7.9 from 9.7 on 5 October.  Status post transfusion and history of GI bleed.  Consider transfusion with dialysis. -Awaiting home med reconciliation continue home Coreg with holding parameters, and statin -Troponin Down from prior.  No chest pain.  DVT prophylaxis: SCD Code Status: full Family Communication: discussed with her husband at bedside Disposition Plan: home 1-2 days  Consults called: Nephrology Dr Jonnie Finner Admission status:  Obs tele    Shelbie Proctor MD [facility] Pager [phone]  If 7PM-7AM, please contact night-coverage [email or web address] Password [email or web address]  05/27/2018, 1:36 AM

## 2018-05-27 NOTE — Progress Notes (Signed)
Attempted to call report to C12 tara the RN will call back  [phone]

## 2018-05-27 NOTE — Progress Notes (Signed)
Patient is experiencing  coughing while eating, 02 sats 99 on RA, RR 20,  paged MD Dyanne Carrel, NP to make aware.

## 2018-05-27 NOTE — Progress Notes (Addendum)
PALLIATIVE NOTE:  Referral received for end of life/goals of care. I have reviewed chart and spoken with patient's husband [name]. I introduced myself and Palliative Medicine to husband. Patient is currently receiving HD.   Husband verbalized appreciation of Palliative being offered and involved in her care. He was tearful and stated "I know this isn't going to end good, and I am not ready but I don't want her suffering either." Support given. Husband request to meet tomorrow morning with he and their son @ 0900 for further discussion. Mr. Boulter aware I will meet them at specified time in patient's room.   Goals of care meeting schedule for 10/14 @0900 .   Detailed note and recommendations to follow.   Thank you for your referral.   Alda Lea, AGPCNP-BC Palliative Medicine Team  Phone: [phone] Fax: [phone] Pager: [phone] Amion: [place]

## 2018-05-27 NOTE — Progress Notes (Signed)
Transported to dialysis with myself pt, son, and, Transporter on 100% non rebreathe, ambu bag in bed. Gave dialysis nurse an update with son. And MD. Belongings with son and another family member to join them

## 2018-05-27 NOTE — Significant Event (Signed)
Rapid Response Event Note Secretary called saying they were calling a code blue on pt   Per RN tele called stating pt had a run of VT. RN reports she was in room talking with pt when she quit talking.     Initial Focused Assessment: On arrival to room I was stopped stating pt recently made a DNR/DNI 30 mins prior to this event. Pt with agonal respirations, palpable carotid pulse, doppler pulse for radial and femoral. Pt not responding, cool to touch. MD paged to bedside.   Interventions: Pt was suctioned at bedside and gradually started to come around. CXR suggest aspiration. Pt returned to [place] (if not transferred): Continue to monitor, call RRT as needed Go to HD for treatment              [place], [place]

## 2018-05-27 NOTE — Progress Notes (Signed)
[facility] PROGRESS NOTE  [name]:[id number] DOB: April 26, 1941 DOA: 05/26/2018 PCP: [name], MD  Assessment/Plan:  #1. Acute on chronic combined heart failure. Pt recent admission for NSTEMI s/p cath and new systolic heart failure and acute on chronic diastolic HF. BNP greater than 4000, chest xray consistent with chf, jvd on exam. Weight 6kg over dry weight per nephrology.  Had extra dialysis yesterday yet remains sob. Evaluated by cards last admission who recommended medical management with coreg. Per chart review not candidate for entresto/ace inhibitor/arb, spironolactone due to ESRD. Patient has been on dialysis for 1 month -dialysis per nephrology -hold coreg for now due to soft BP -daily weight -intake and output -hold off on cards consult for now  #2. ESRD stage V. Has been on dialysis for 1 month. MWF schedule. Had extra session yesterday. Per nephrology note AVF unable to be salvaged. To f/u for new access as outpatient. In addition nephrology opined not doing so well on dialysis due to frailness and poor cardiac function -dialysis today per nephrology -appreciate nephrology assistance  #3. Dyspnea/respiratory distress/acute encephalopathy/aspiration pneumonia. Likely related to aspiration episode at breakfast in setting of #1 in setting of chronic worsening anemia. Patient suddenly unresponsive with short run vtach. RR team called. NRB applied, ET suction with thick white secretions. Repeat xray with increasing infiltrate in right base. Oxygen saturation level rebounded with suctioning. Patient returned to baseline LOC with oxygen and suctioning. Of note had long discussion with patient and husband (prior to event) regarding code status and limited code decided upon by patient and husband. After event discussed end of life journey and goals of care. Husband agreed to palliative care consult -see #1 -blood cultures -sputum culture -vanc and maxepime per  pharmacy -oxygen supplementation -monitor closely -transfer to stepdown -dialysis as planned  #4. Hypertension. Home meds include coreg. Recently losartan discontinued. BP somewhat soft -hold antihypertensive meds for now -monitor closely -resume home med if indicated  #5. Anemia. Trending down.  Chronic related to ESRD. Per chart hx of gi bleed 10/2016 and IDA. Takes Aranesp every 3 weeks with goal to get Hg to 11. Hg 9 1 week ago and 7.8 today.   Recently transfused 2 units PRBC. Of note chart review indicates patient not wanting any gi work up. -transfuse 1 unit PRBC's per dr Melvia Heaps -recheck  #6.Diabetes. Serum glucose 202. Diet controlled -monitor  #7. Aortic stenosis moderate to severe -OP follow up   Code Status: limited Family Communication: husband at bedside Disposition Plan: to be determined   Consultants:  Dr Melvia Heaps nephrology  Procedures:  dialysis  Antibiotics:  Vancomycin 10/13>>  maxepime 10/13>>  HPI/Subjective: Patient continues with SOB. Denies pain/nause. See A/P for details of   [name] is a 77 y.o. female with medical history significant of end-stage renal disease on hemodialysis, coronary artery disease, type 2 diabetes, CHF presented with shortness of breath 10/13.  Patient actually underwent extra dialysis prior to presentation. She reports weight gain in spite of this.  She continued to have orthopnea and shortness of breath.  Denied any fever.  She had a recent hospitalizations for a GI bleed status post blood transfusion.  Hemoglobin today is down to 7.9.  Had a cardiac catheterization earlier this month that showed extensive 2-vessel coronary artery disease.  recommendation is to continue medical management.  She did have a new drop in her EF to 20-25%.  Cardiology did not think it was ischemic in nature.  Objective: Vitals:  05/27/18 1120 05/27/18 1155  BP:  116/62  Pulse: 92 79  Resp: 20 (!) 24  Temp:  97.6 F (36.4 C)  SpO2:  94% 100%   No intake or output data in the 24 hours ending 05/27/18 1308 Filed Weights   05/26/18 2228 05/27/18 0236  Weight: 49 kg 55.6 kg    Exam:   General:  Thin frail, pale, chronically ill appearing in mild distress. Skin cool to touch from waist up and warm waist down  Cardiovascular: rrr HS quite distant trace LE edema +jvd  Respiratory: mild to moderate increased work of breathing BS with diffuse rhonchi and crackles bases. Audible gurgling, poor cough effort   Abdomen: soft +Bs but sluggish non-tender to palpation  Musculoskeletal: joints without swelling/erythema  Neuro: awake alert but drowsy follows commands oriented to self and place   Data Reviewed: Basic Metabolic Panel: Recent Labs  Lab 05/26/18 2236 05/27/18 1031  NA 126* 125*  K 4.6 5.0  CL 94* 93*  CO2 24 22  GLUCOSE 156* 202*  BUN 31* 33*  CREATININE 3.21* 3.51*  CALCIUM 8.5* 9.0  MG  --  2.2  PHOS  --  3.7   Liver Function Tests: Recent Labs  Lab 05/27/18 1031  ALBUMIN 3.3*   No results for input(s): LIPASE, AMYLASE in the last 168 hours. No results for input(s): AMMONIA in the last 168 hours. CBC: Recent Labs  Lab 05/26/18 2236  WBC 9.0  HGB 7.9*  HCT 24.4*  MCV 107.0*  PLT 121*   Cardiac Enzymes: No results for input(s): CKTOTAL, CKMB, CKMBINDEX, TROPONINI in the last 168 hours. BNP (last 3 results) Recent Labs    05/26/18 2237  BNP 4,003.0*    ProBNP (last 3 results) No results for input(s): PROBNP in the last 8760 hours.  CBG: Recent Labs  Lab 05/27/18 0240 05/27/18 0746 05/27/18 1058  GLUCAP 118* 125* 208*    Recent Results (from the past 240 hour(s))  MRSA PCR Screening     Status: Abnormal   Collection Time: 05/27/18  2:55 AM  Result Value Ref Range Status   MRSA by PCR POSITIVE (A) NEGATIVE Final    Comment: RESULT CALLED TO, READ BACK BY AND VERIFIED WITH: RN, Nettie Elm [id number] @0545  THANEY      Studies: Dg Chest 2 View  Result Date:  05/26/2018 CLINICAL DATA:  77 year old female with history of increasing shortness of breath. Hypoxemia. EXAM: CHEST - 2 VIEW COMPARISON:  Chest x-ray 05/15/2018. FINDINGS: Right internal jugular PermCath with tip terminating at the superior cavoatrial junction. Status post median sternotomy for CABG. Lung volumes are normal. There is cephalization of the pulmonary vasculature and slight indistinctness of the interstitial markings suggestive of mild pulmonary edema. Small bilateral pleural effusions. Mild cardiomegaly. Upper mediastinal contours are within normal limits. Aortic atherosclerosis. IMPRESSION: 1. The appearance the chest suggests congestive heart failure, as above. 2. Aortic atherosclerosis. 3. Postoperative changes and support apparatus, as above. Electronically Signed   By: Vinnie Langton M.D.   On: 05/26/2018 23:12   Dg Chest Port 1 View  Result Date: 05/27/2018 CLINICAL DATA:  Status post arrest EXAM: PORTABLE CHEST 1 VIEW COMPARISON:  07/23/2009 FINDINGS: Cardiac shadow is enlarged but stable. Postsurgical changes are again seen. Dialysis catheter is again noted and stable. Increasing right-sided pleural effusion and right basilar infiltrate is noted when compare with the prior exam. Mild vascular congestion interstitial edema is noted as well. IMPRESSION: Changes of mild CHF. Increasing infiltrate in the right  base. This may be related to the given clinical history of aspiration. Electronically Signed   By: Inez Catalina M.D.   On: 05/27/2018 11:38    Scheduled Meds: . calcitRIOL  0.5 mcg Oral Daily  . [START ON 05/28/2018] ceFEPime (MAXIPIME) IV  500 mg Intravenous Q24H  . Chlorhexidine Gluconate Cloth  6 each Topical Q0600  . folic acid  1 mg Oral Daily  . furosemide      . furosemide      . furosemide      . insulin aspart  0-9 Units Subcutaneous TID WC  . mupirocin ointment  1 application Nasal BID  . rosuvastatin  5 mg Oral q1800  . sevelamer carbonate  1,600 mg Oral TID  WC  . sodium chloride flush  3 mL Intravenous Q12H  . trimethoprim  50 mg Oral Daily  . [START ON 05/28/2018] vancomycin  500 mg Intravenous Q M,W,F-HD  . vitamin B-12  500 mcg Oral Daily   Continuous Infusions: . sodium chloride    . ceFEPime (MAXIPIME) IV    . vancomycin      Principal Problem:   Acute on chronic combined systolic and diastolic heart failure (HCC) Active Problems:   ESRD (end stage renal disease) on dialysis (HCC)   Acute respiratory distress   Acute encephalopathy   Diabetes mellitus type II, non insulin dependent (HCC)   Anemia, chronic renal failure   Protein-calorie malnutrition, severe   Dyspnea   Aspiration pneumonia (HCC)   Essential hypertension   Acute respiratory failure with hypoxia ([place])    Time spent: 75 minutes    [facility]  If 7PM-7AM, please contact night-coverage at [email or web address], password [facility] 05/27/2018, 1:08 PM  LOS: 0 days

## 2018-05-27 NOTE — Progress Notes (Signed)
At this time was entering the patients room to given medication for anxiety prior to transportation. Patient expressed that she was soiled, called the walkie for clean linen, walking to the door to receive from Nurse tech and CCMD called that patient was in V-Tach went back to bedside and patient was unresponsive, Called rapid Response, 02 sat  Nonredbreather was placed,  BS 208, Code stats had just changed 30 mins prior to event. Code Protocol was  Met . No intubation not compressions. MD was rapid to the beside, Resp. N/G suctioned. Patient response well with orientation 02 sat 100%.. Plan to continue with Diaylsis as planned. and then Step down bed. See flow sheet for vitals.

## 2018-05-28 ENCOUNTER — Inpatient Hospital Stay (HOSPITAL_COMMUNITY): Payer: Medicare HMO

## 2018-05-28 DIAGNOSIS — J690 Pneumonitis due to inhalation of food and vomit: Secondary | ICD-10-CM

## 2018-05-28 DIAGNOSIS — Z992 Dependence on renal dialysis: Secondary | ICD-10-CM

## 2018-05-28 DIAGNOSIS — E119 Type 2 diabetes mellitus without complications: Secondary | ICD-10-CM

## 2018-05-28 DIAGNOSIS — N186 End stage renal disease: Secondary | ICD-10-CM

## 2018-05-28 DIAGNOSIS — D631 Anemia in chronic kidney disease: Secondary | ICD-10-CM

## 2018-05-28 DIAGNOSIS — I5021 Acute systolic (congestive) heart failure: Secondary | ICD-10-CM

## 2018-05-28 DIAGNOSIS — Z515 Encounter for palliative care: Secondary | ICD-10-CM

## 2018-05-28 DIAGNOSIS — Z7189 Other specified counseling: Secondary | ICD-10-CM

## 2018-05-28 DIAGNOSIS — G9340 Encephalopathy, unspecified: Secondary | ICD-10-CM

## 2018-05-28 DIAGNOSIS — E43 Unspecified severe protein-calorie malnutrition: Secondary | ICD-10-CM

## 2018-05-28 DIAGNOSIS — J9601 Acute respiratory failure with hypoxia: Secondary | ICD-10-CM

## 2018-05-28 DIAGNOSIS — N185 Chronic kidney disease, stage 5: Secondary | ICD-10-CM

## 2018-05-28 DIAGNOSIS — Z66 Do not resuscitate: Secondary | ICD-10-CM

## 2018-05-28 LAB — BASIC METABOLIC PANEL
ANION GAP: 8 (ref 5–15)
BUN: 14 mg/dL (ref 8–23)
CHLORIDE: 100 mmol/L (ref 98–111)
CO2: 26 mmol/L (ref 22–32)
Calcium: 8.3 mg/dL — ABNORMAL LOW (ref 8.9–10.3)
Creatinine, Ser: 2.33 mg/dL — ABNORMAL HIGH (ref 0.44–1.00)
GFR calc Af Amer: 22 mL/min — ABNORMAL LOW (ref >60)
GFR calc non Af Amer: 19 mL/min — ABNORMAL LOW (ref >60)
Glucose, Bld: 95 mg/dL (ref 70–99)
POTASSIUM: 4 mmol/L (ref 3.5–5.1)
SODIUM: 134 mmol/L — AB (ref 135–145)

## 2018-05-28 LAB — GLUCOSE, CAPILLARY
GLUCOSE-CAPILLARY: 103 mg/dL — AB (ref 70–99)
GLUCOSE-CAPILLARY: 157 mg/dL — AB (ref 70–99)
Glucose-Capillary: 133 mg/dL — ABNORMAL HIGH (ref 70–99)

## 2018-05-28 LAB — CBC
HCT: 23.5 % — ABNORMAL LOW (ref 36.0–46.0)
HEMOGLOBIN: 7.1 g/dL — AB (ref 12.0–15.0)
MCH: 33 pg (ref 26.0–34.0)
MCHC: 30.2 g/dL (ref 30.0–36.0)
MCV: 109.3 fL — ABNORMAL HIGH (ref 80.0–100.0)
NRBC: 0 % (ref 0.0–0.2)
PLATELETS: 112 10*3/uL — AB (ref 150–400)
RBC: 2.15 MIL/uL — AB (ref 3.87–5.11)
RDW: 18.4 % — ABNORMAL HIGH (ref 11.5–15.5)
WBC: 4.8 10*3/uL (ref 4.0–10.5)

## 2018-05-28 LAB — PREPARE RBC (CROSSMATCH)

## 2018-05-28 MED ORDER — ACETAMINOPHEN 325 MG PO TABS: 650.0000 mg | ORAL_TABLET | Freq: Four times a day (QID) | ORAL | Status: DC | PRN

## 2018-05-28 MED ORDER — SODIUM CHLORIDE 0.9 % IV SOLN
1.0000 g | INTRAVENOUS | Status: DC
  Filled 2018-05-28: qty 1

## 2018-05-28 MED ORDER — ALPRAZOLAM 0.5 MG PO TABS: 1.0000 mg | ORAL_TABLET | Freq: Every day | ORAL | Status: DC

## 2018-05-28 MED ORDER — CHLORHEXIDINE GLUCONATE CLOTH 2 % EX PADS: 6.0000 | MEDICATED_PAD | Freq: Every day | CUTANEOUS | Status: DC

## 2018-05-28 MED ORDER — CHLORHEXIDINE GLUCONATE CLOTH 2 % EX PADS
6.0000 | MEDICATED_PAD | Freq: Every day | CUTANEOUS | Status: DC
  Administered 2018-05-28 – 2018-05-30 (×3): 6 via TOPICAL

## 2018-05-28 MED ORDER — HEPARIN SODIUM (PORCINE) 1000 UNIT/ML IJ SOLN
INTRAMUSCULAR | Status: AC
  Filled 2018-05-28: qty 4

## 2018-05-28 MED ORDER — NEPRO/CARBSTEADY PO LIQD
237.0000 mL | Freq: Two times a day (BID) | ORAL | Status: DC
  Administered 2018-05-29 – 2018-05-30 (×2): 237 mL via ORAL
  Filled 2018-05-28 (×5): qty 237

## 2018-05-28 NOTE — Progress Notes (Signed)
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   GOAL:   Patient will meet greater than or equal to 90% of their needs  MONITOR:   PO intake, Supplement acceptance, Labs, Weight trends, Skin, I & O's  REASON FOR ASSESSMENT:   Malnutrition Screening Tool    ASSESSMENT:      Pt admitted with volume overload.   10/14- s/p MBSS- advanced to a regular consistency diet with thin liquids  Spoke with pt at bedside, who reports a decreased appetite over the past 6 months, "due to all my heart problems". She shares that her intake has been decreased due to poor appetite ("I just don't feel like eating"). Pt reports that she tries to consume 3 meals per day, but often uses supplements as meal replacements. Meals consists usually of a sandwich and occasionally a cookie. Pt reports she will consume 1-2 Ensure supplements (Equate Nutritonal Drink Plus). SHe also takes folvite, vitamin C, and calcitrol supplements per her report. Observed lunch tray- pt consumed over 75% of lunch time meal; pt reports improved swallow and was able to demonstrate chin tuck strategy that SLP instructed pt on earlier today.   Pt endorses wt loss. She shares her UBW is around 135#. She lost of 109# and has regained some of her lost weight. She endorses dry wt of 49 kg. Noted pt has experienced a 6.4% wt loss over the past year, which is not significant for time frame, however, wt hx changes difficult to assess due to ESRD on HD.   Discussed importance of good meal and supplement intake to promote  healing. Pt leery of taking Ensure supplements due to fluid restriction- amenable to Nepro due to higher nutrient density.   Palliative care team following closely to discuss goals of care.   Last Hgb A1c: 6.0 (05/15/18). PTA DM medications are none.   Labs reviewed: Na: 134, CBGS: 103-133 (inpatient orders for glycemic control are 0-9 units insulin aspart TID with meals).   NUTRITION - FOCUSED PHYSICAL EXAM:    Most Recent Value  Orbital Region  Moderate depletion  Upper Arm Region  Severe depletion  Thoracic and Lumbar Region  Severe depletion  Buccal Region  Moderate depletion  Temple Region  Severe depletion  Clavicle Bone Region  Severe depletion  Clavicle and Acromion Bone Region  Severe depletion  Scapular Bone Region  Severe depletion  Dorsal Hand  Severe depletion  Patellar Region  Severe depletion  Anterior Thigh Region  Severe depletion  Posterior Calf Region  Severe depletion  Edema (RD Assessment)  None  Hair  Reviewed  Eyes  Reviewed  Mouth  Reviewed  Skin  Reviewed  Nails  Reviewed       Diet Order:   Diet Order            Diet renal/carb modified with fluid restriction Diet-HS Snack? Nothing; Fluid restriction: 1200 mL Fluid; Room service appropriate? Yes; Fluid consistency: Thin  Diet effective now              EDUCATION NEEDS:   Education needs have been addressed  Skin:  Skin Integrity Issues:: Stage I, Stage II, Incisions Stage I: sacrum Stage II: sacrum, buttocks Incisions: open wound rt upper back  Last BM:  05/26/18  Height:   Ht Readings from Last 1 Encounters:  05/27/18 5\' 8"  (1.727 m)    Weight:   Wt Readings from Last 1 Encounters:  05/28/18 53.8 kg    Ideal Body Weight:  63.6 kg  BMI:  Body mass index is 18.03 kg/m.  Estimated Nutritional Needs:   Kcal:  1700-1900  Protein:  85-100 grams  Fluid:  1000 ml + UOP    [name] A. Jimmye Norman, RD, LDN, CDE Pager: [phone] After hours Pager: [phone]

## 2018-05-28 NOTE — Evaluation (Signed)
Clinical/Bedside Swallow Evaluation Patient Details  Name: [name] MRN: [id number] Date of Birth: November 13, 1940  Today's Date: 05/28/2018 Time: SLP Start Time (ACUTE ONLY): 0815 SLP Stop Time (ACUTE ONLY): 0828 SLP Time Calculation (min) (ACUTE ONLY): 13 min  Past Medical History:  Past Medical History:  Diagnosis Date  . Anemia    Pt is taking iron.   [name] Anxiety   . Arthritis   . Carotid stenosis    40-59% bilateral ICA stenosis in 2/12.  . Chronic low back pain   . CKD (chronic kidney disease)    Dr. Audie Clear at [facility]  . Coronary artery disease    Pt presented 2/10 to [facility] with NSTEMI and diastolic CHF exacerbation.  LHC was done  3/10 showing 99% pRCA stenosis and 80% calcified pLAD stenosis with L=>R collaterals.  Pt was referred  for CABG which was done by Dr. Prescott Gum with LIMA-LAD, SVG-RCA, SVG-OM.  . Diabetes mellitus   . Diabetic neuropathy ([place])   . Diastolic CHF (HCC)    Echo (2/10) showed EF 55-65%, mild LVH, diastolic dysfunction, mild AS with mean gradient 12 mmHg, PASP 43 mmHg.  Echo (2/12): EF 55-60%, mild LVH, mild AS (mean gradient 12), PA systolic pressure 32 mmHg.     [name] GERD (gastroesophageal reflux disease)   . Heart murmur   . Hyperlipidemia   . Hypertension   . Mild aortic stenosis    mean gradient 12 mmHg in 2/12.  . Myocardial infarction ([place])    "mild"  . Pneumonia   . PONV (postoperative nausea and vomiting)   . Thrombocytopenia ([place])   . Unsteady gait    Past Surgical History:  Past Surgical History:  Procedure Laterality Date  . AV FISTULA PLACEMENT Left 01/30/2015   Procedure: Creation of a Radial Cephalic AV Fistula left wrist;  Surgeon: [name], MD;  Location: [place];  Service: Vascular;  Laterality: Left;  . BACK SURGERY     multiple  . BREAST SURGERY     biopsy  . CARDIAC CATHETERIZATION    . CATARACT EXTRACTION W/ INTRAOCULAR LENS  IMPLANT, BILATERAL    . COLONOSCOPY Left 11/03/2016   Procedure:  COLONOSCOPY;  Surgeon: [name], MD;  Location: [facility] ENDOSCOPY;  Service: Endoscopy;  Laterality: Left;  . COLONOSCOPY W/ BIOPSIES AND POLYPECTOMY    . CORONARY ARTERY BYPASS GRAFT  09/2008   pt with NSTEMI and diastolic CHF exacerbation.  LHC was done  3/10 showing 99% pRCA stenosis and 80% calcified pLAD stenosis with L=>R collaterals.  Pt was referred  for CABG which was done by Dr. Prescott Gum with LIMA-LAD, SVG-RCA, SVG-OM.  [name] ESOPHAGOGASTRODUODENOSCOPY N/A 11/02/2016   Procedure: ESOPHAGOGASTRODUODENOSCOPY (EGD);  Surgeon: [name], MD;  Location: [facility] ENDOSCOPY;  Service: Endoscopy;  Laterality: N/A;  . GIVENS CAPSULE STUDY N/A 11/29/2016   Procedure: GIVENS CAPSULE STUDY;  Surgeon: [name], MD;  Location: [place];  Service: Endoscopy;  Laterality: N/A;  . REVISON OF ARTERIOVENOUS FISTULA Left 07/02/2015   Procedure: REVISON OF LEFT RADIOCEPHALIC ARTERIOVENOUS FISTULA;  Surgeon: [name], MD;  Location: [place];  Service: Vascular;  Laterality: Left;  . RIGHT/LEFT HEART CATH AND CORONARY/GRAFT ANGIOGRAPHY N/A 05/18/2018   Procedure: RIGHT/LEFT HEART CATH AND CORONARY/GRAFT ANGIOGRAPHY;  Surgeon: Leonie Man, MD;  Location: [place] [facility];  Service: Cardiovascular;  Laterality: N/A;   HPI:  [name] is a 77 y.o. female with medical history significant of end-stage renal disease on hemodialysis,  coronary artery disease, type 2 diabetes, GERD, PNA, CHF presented to [facility] ED 10/12 with shortness of breath and palpations. Pt had recent admission (early October) for anemia requiring blood transfusion as well as a NSTEMI and notable echo for reduced EF to 20%. Per chart, RN noted pt coughing during meals yesterday (10/13). CXR revealed increasing infiltrate in the right luncg base suspicious for aspiration.   Assessment / Plan / Recommendation Clinical Impression  Pt was alert and pleasant throughout bedside evaluation. She reported no incidence of recent pneumonia (within  last 2 yrs), which her husband confirmed. Although prior dysphagia noted, pt and husband stated pt began coughing during PO intake shortly before and during this hospitalization. She described feeling choked/strangled. Oral motor examination revealed lingual and labial ROM WFL, slight left lingual asymmetry noted. Pt did not exhibit overt s/s aspiration throughout 3 oz water test, additional trials thin, puree, and regular. However, given recent chest xray (right lower lobe infiltrate) suspicious for aspiration pneumonia, recommend pt continue NPO until objective MBS test complete later this morning.    SLP Visit Diagnosis: Dysphagia, unspecified (R13.10)    Aspiration Risk  Mild aspiration risk    Diet Recommendation NPO except meds   Medication Administration: Whole meds with puree Supervision: Full supervision/cueing for compensatory strategies    Other  Recommendations Oral Care Recommendations: Oral care before and after PO;Oral care QID   Follow up Recommendations (TBD)      Frequency and Duration            Prognosis Prognosis for Safe Diet Advancement: Good      Swallow Study   General HPI: [name] is a 77 y.o. female with medical history significant of end-stage renal disease on hemodialysis, coronary artery disease, type 2 diabetes, GERD, PNA, CHF presented to [facility] ED 10/12 with shortness of breath and palpations. Pt had recent admission (early October) for anemia requiring blood transfusion as well as a NSTEMI and notable echo for reduced EF to 20%. Per chart, RN noted pt coughing during meals yesterday (10/13). CXR revealed increasing infiltrate in the right luncg base suspicious for aspiration. Type of Study: Bedside Swallow Evaluation Previous Swallow Assessment: none found in chart Diet Prior to this Study: NPO Temperature Spikes Noted: Yes Respiratory Status: Room air History of Recent Intubation: No Behavior/Cognition: Alert;Cooperative;Pleasant mood Oral  Cavity Assessment: Within Functional Limits Oral Care Completed by SLP: No Oral Cavity - Dentition: Adequate natural dentition Vision: Functional for self-feeding Self-Feeding Abilities: Able to feed self Patient Positioning: Upright in bed Baseline Vocal Quality: Normal Volitional Cough: Strong Volitional Swallow: Able to elicit    Oral/Motor/Sensory Function Overall Oral Motor/Sensory Function: Mild impairment Facial ROM: Within Functional Limits Facial Symmetry: Within Functional Limits Facial Strength: Within Functional Limits Lingual ROM: Within Functional Limits Lingual Symmetry: Abnormal symmetry left Lingual Strength: Within Functional Limits Velum: Within Functional Limits Mandible: Within Functional Limits   Ice Chips Ice chips: Not tested   Thin Liquid Thin Liquid: Within functional limits Presentation: Cup;Straw    Nectar Thick Nectar Thick Liquid: Not tested   Honey Thick Honey Thick Liquid: Not tested   Puree Puree: Within functional limits Presentation: Self Fed;Spoon   Solid    Jettie Booze, Student SLP  Solid: Within functional limits      Jettie Booze 05/28/2018,9:46 AM

## 2018-05-28 NOTE — Progress Notes (Signed)
I received a [place] to provide Advance Directive information for the patient. I visited the patient's room but the patient was in dialysis and her husband had left the hospital for the day. I shared with the nurse that I can return at a later time to meet with the patient and her family for the AD. The nurse agreed that another time would be better for the patient.    05/28/18 1500  Clinical Encounter Type  Visited With Health care provider  Visit Type Follow-up  Referral From Nurse  Consult/Referral To Chaplain    Chaplain Dr Redgie Grayer

## 2018-05-28 NOTE — Consult Note (Signed)
Consultation Note Date: 05/28/2018   Patient Name: [name]  DOB: [birth year]  MRN: [id number]  Age / Sex: 77 y.o., female  PCP: [name], MD Referring Physician: Cherene Altes, MD  Reason for Consultation: Establishing goals of care  HPI/Patient Profile: 77 y.o. female admitted on 05/26/2018 from home with complaints of shortness of breath. 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 She was recently hospitalized for a GI bleed and received a blood transfusion. She also underwent cardiac catheterization which showed extensive 2 vessel CAD. EF 20-25%. Recommendations were medical management. Patient presents to ED with complaints of increasing shortness of breath. During her ED course she reported she underwent additional dialysis and had about 2 lbs up removed. Chest x-ray showed volume overload. Since admission patient has been followed by nephrology and receiving dialysis as appropriate. On 10/13 patient had an episode of unstable VTach which spontaneously resolved. SLP has also been consulted for possible aspiration. Palliative Medicine team consulted for goals of care discussion.   Clinical Assessment and Goals of Care: I have reviewed medical records including lab results, imaging, Epic notes, and MAR, received report from the bedside RN, and assessed the patient. I then met at the bedside with patient and her husband, Linton Ham to discuss diagnosis prognosis, [place], EOL wishes, disposition and options. Patient is awake. A&O x3. She denies pain She is capable of engaging in goals of care discussion with her husband and I.   I introduced Palliative Medicine as specialized medical care for people living with serious illness. It focuses on providing relief from the symptoms and stress of a serious  illness. The goal is to improve quality of life for both the patient and the family.  We discussed a brief life review of the patient. 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 She has been married to her husband for 32 years and they have one son Ok Edwards. They have a rescue poodle named [name] whom she loves dearly. She states she loves reading mystery novels and addicted to watching reality tv.   As far as functional and nutritional status patient and family reports a noticeable decline in her health over the past 3-4 months. Patient reports her appetite has not been the best over the past few months. She states she is now 118lb and was approximately 135lb about 4 months ago. She is ambulatory with a walker, but has noticeable weakness requiring assistance at times. She reports she has been taking HD for a little over a month and generally sleeps most of the day after getting back home. Her husband currently transports her back and forth for treatments. Patient also endorses several falls over the past year. She states she has been experiencing worsening shortness of breath to the point she feels as though she is drowning.   We discussed her current illness and what it means in the larger context of her on-going co-morbidities.  Natural disease  trajectory and expectations at EOL were discussed. Patient and husband both verbalized understanding. They remain hopeful for the best but also prepared for the worst. She states she feels as though the dialysis may be helping her and she wants to continue to see how she does. She expresses not liking coming into the hospital as often as she has lately, but would continue if that is what is required if she is unable to breath.   I attempted to elicit values and goals of care important to the patient and her husband.   The difference between aggressive medical intervention and comfort care was considered in light of the patient's goals of care. At this time  patient expresses wishes to continue with dialysis and other treatments as recommended.   We discussed patient's episode of V-tach on yesterday and interventions performed. I educated patient and husband on her current partial code status and what a potential code could look like in consideration of her current illness and co-morbidities. Patient and husband both expressed that if patient was to pass away her request would be to allow her to pass away naturally without intervention. I discussed in details wishes for cpr, defibrillation, intubation, ACLS medications, and use of BiPAP. Patient expressed wishes that she would not want to have any of these done and didn't want to further prolong her death knowing her quality of life is barely manageable in her current state. Husband verbalized agreement and voiced they had spoken about this to each other. Patient educated that her code status would be changed to DNR/DNI based on her expressed wishes. She and husband verbalized understanding. Patient also aware that DNR bracelet would be applied by bedside RN.   Patient stated she did not have a formal advance directive and in the event she was unable to make medical decisions her husband and son would be her 16. Offered patient the option of completing necessary advanced directives while hospitalized. She verbalized her wishes to complete. Patient and husband aware that chaplain consult would be placed for assistance with completion based on her request.   Also discussed artifical feedings with patient. She and husband both verbalized they were not interested in artificial feedings such as PEG tube.   Husband requested documentation of her wishes to have in the home. MOST form completed with patient and husband and Out of facility DNR completed and placed on patients chart.   Hospice and Palliative Care services outpatient were explained and offered. Given patients expressed wishes to continue with dialysis  she was advised that outpatient Palliative was most appropriate. Patient and husband verbalized understanding. They are aware if her condition continues to decline or they chose to discontinue with HD they could discuss with their outpatient palliative team or provider to assist with transitioning care to hospice. Both verbalized understanding and appreciation.   Questions and concerns were addressed.  Hard Choices booklet left for review. The family was encouraged to call with questions or concerns.  PMT will continue to support holistically.  Primary Decision Maker: Patient is A&Ox3. She is capable of making medical decisions at this time. She expresses in the event she is unable to make decisions her husband and son would be her 43.   SUMMARY OF RECOMMENDATIONS    DNR/DNI-at patient/husband's request. MOST/Out of facility DNR completed at their request and placed in chart.   Continue to treat the treatable. Patient wishes to continue to with HD and other recommended medical interventions.   Patient/husband requesting outpatient palliative  services at discharge and assistance with completion of advance directives.  Case manager referral for outpatient palliative.   Chaplain consult for assistance with advance directives completion.   Palliative Medicine Team will continue to support patient, family, and medical providers during hospitalization.   Code Status/Advance Care Planning:  DNR  Palliative Prophylaxis:   Aspiration, Bowel Regimen, Delirium Protocol, Frequent Pain Assessment, Oral Care and Turn Reposition  Additional Recommendations (Limitations, Scope, Preferences):  Full Scope Treatment, No Artificial Feeding and No Tracheostomy  Psycho-social/Spiritual:   Desire for further Chaplaincy support:yes  Additional Recommendations: Caregiving  Support/Resources  Prognosis:  Unable to determine -guarded to poor in the setting of ESRD on HD, EF 20-25%, CAD, aspiration,  anemia, anxiety, arthritis, MI, hypertension, hyperlipidemia, protein calorie malnutrition, weight loss >10%, deconditioning, recent episode of V-tach, and anemia.   Discharge Planning: To Be Determined with outpatient Palliative.      Primary Diagnoses: Present on Admission: . Anemia, chronic renal failure . Essential hypertension . Protein-calorie malnutrition, severe . Acute on chronic combined systolic and diastolic heart failure ([place]) . Dyspnea . Acute respiratory failure with hypoxia ([place]) . Acute respiratory distress   I have reviewed the medical record, interviewed the patient and family, and examined the patient. The following aspects are pertinent.  Past Medical History:  Diagnosis Date  . Anemia    Pt is taking iron.   [name] Anxiety   . Arthritis   . Carotid stenosis    40-59% bilateral ICA stenosis in 2/12.  . Chronic low back pain   . CKD (chronic kidney disease)    Dr. Audie Clear at [facility]  . Coronary artery disease    Pt presented 2/10 to [facility] with NSTEMI and diastolic CHF exacerbation.  LHC was done  3/10 showing 99% pRCA stenosis and 80% calcified pLAD stenosis with L=>R collaterals.  Pt was referred  for CABG which was done by Dr. Prescott Gum with LIMA-LAD, SVG-RCA, SVG-OM.  . Diabetes mellitus   . Diabetic neuropathy ([place])   . Diastolic CHF ([facility])    Echo (2/10) showed EF 55-65%, mild LVH, diastolic dysfunction, mild AS with mean gradient 12 mmHg, PASP 43 mmHg.  Echo (2/12): EF 55-60%, mild LVH, mild AS (mean gradient 12), PA systolic pressure 32 mmHg.     [name] GERD (gastroesophageal reflux disease)   . Heart murmur   . Hyperlipidemia   . Hypertension   . Mild aortic stenosis    mean gradient 12 mmHg in 2/12.  . Myocardial infarction ([place])    "mild"  . Pneumonia   . PONV (postoperative nausea and vomiting)   . Thrombocytopenia ([place])   . Unsteady gait    Social History   Socioeconomic History  . Marital status: Married    Spouse name: Not  on file  . Number of children: Not on file  . Years of education: Not on file  . Highest education level: Not on file  Occupational History  . Not on file  Social Needs  . Financial resource strain: Not on file  . Food insecurity:    Worry: Not on file    Inability: Not on file  . Transportation needs:    Medical: Not on file    Non-medical: Not on file  Tobacco Use  . Smoking status: Former Smoker    Types: Cigarettes  . Smokeless tobacco: Never Used  . Tobacco comment: quit 1988  Substance and Sexual Activity  . Alcohol use: Yes    Comment: beer daily  .  Drug use: No  . Sexual activity: Never    Partners: Male  Lifestyle  . Physical activity:    Days per week: Not on file    Minutes per session: Not on file  . Stress: Not on file  Relationships  . Social connections:    Talks on phone: Not on file    Gets together: Not on file    Attends religious service: Not on file    Active member of club or organization: Not on file    Attends meetings of clubs or organizations: Not on file    Relationship status: Not on file  Other Topics Concern  . Not on file  Social History Narrative  . Not on file   Family History  Adopted: Yes  Family history unknown: Yes   Scheduled Meds: . calcitRIOL  0.5 mcg Oral Daily  . Chlorhexidine Gluconate Cloth  6 each Topical Q0600  . Chlorhexidine Gluconate Cloth  6 each Topical Q0600  . Chlorhexidine Gluconate Cloth  6 each Topical Q0600  . folic acid  1 mg Oral Daily  . insulin aspart  0-9 Units Subcutaneous TID WC  . mupirocin ointment  1 application Nasal BID  . rosuvastatin  5 mg Oral q1800  . sevelamer carbonate  1,600 mg Oral TID WC  . sodium chloride flush  3 mL Intravenous Q12H  . trimethoprim  50 mg Oral Daily  . vancomycin  500 mg Intravenous Q M,W,F-HD  . vitamin B-12  500 mcg Oral Daily   Continuous Infusions: . sodium chloride    . ceFEPime (MAXIPIME) IV     PRN Meds:.sodium chloride, acetaminophen, ALPRAZolam,  Gerhardt's butt cream, ondansetron (ZOFRAN) IV, sodium chloride flush Medications Prior to Admission:  Prior to Admission medications   Medication Sig Start Date End Date Taking? Authorizing Provider  ALPRAZolam Duanne Moron) 1 MG tablet Take 1 mg by mouth at bedtime.  11/18/17  Yes [provider]  AMITIZA 24 MCG capsule Take 24 mcg by mouth See admin instructions. Take 24 mcg by mouth two times a day, every other day 06/05/17  Yes [provider]  aspirin 81 MG tablet Take 1 tablet (81 mg total) by mouth daily. 05/19/18  Yes Samuella Cota, MD  b complex-vitamin c-folic acid (NEPHRO-VITE) 0.8 MG TABS tablet Take 1 tablet by mouth daily. 05/01/18  Yes [provider]  calcitRIOL (ROCALTROL) 0.5 MCG capsule Take 0.5 mcg by mouth daily. 04/09/18  Yes [provider]  carvedilol (COREG) 12.5 MG tablet Take 1 tablet (12.5 mg total) by mouth every Tuesday, Thursday, Saturday, and Sunday. 05/19/18  Yes Samuella Cota, MD  folic acid (FOLVITE) 1 MG tablet Take 1 tablet (1 mg total) by mouth daily. 04/24/18  Yes [name], MD  losartan (COZAAR) 25 MG tablet Take 1 tablet (25 mg total) by mouth daily. 04/22/14  Yes Larey Dresser, MD  rosuvastatin (CRESTOR) 5 MG tablet TAKE 1 TABLET(5 MG) BY MOUTH DAILY Patient taking differently: Take 5 mg by mouth at bedtime.  06/27/17  Yes [name], NP  sevelamer carbonate (RENVELA) 800 MG tablet Take 1,600 mg 3 (three) times daily with meals by mouth.  12/06/16  Yes [provider]  trimethoprim (TRIMPEX) 100 MG tablet Take 50 mg by mouth daily.  11/07/14  Yes [provider]  vitamin B-12 (CYANOCOBALAMIN) 500 MCG tablet Take 1 tablet (500 mcg total) by mouth daily. 04/23/18  Yes [name], MD  metoprolol succinate (TOPROL-XL) 50  MG 24 hr tablet Take 50 mg by mouth 2 (two) times daily. Take with or immediately following a meal.  05/15/18  [provider]   Allergies  Allergen Reactions  .  Doxycycline Nausea And Vomiting    Caused "DEATHLY NAUSEA AND VOMITING"  . Lipitor [Atorvastatin] Other (See Comments)    Stomach pain  . Strawberry Extract Rash   Review of Systems  Constitutional: Positive for appetite change and fatigue.  Respiratory: Positive for shortness of breath.   Cardiovascular: Positive for leg swelling.  Neurological: Positive for weakness.  All other systems reviewed and are negative.   Physical Exam  Constitutional: She appears cachectic. She is cooperative. She appears ill.  Thin, frail, chronically ill appearing   Cardiovascular: Normal rate, regular rhythm, normal heart sounds and normal pulses.  Hypotensive   Pulmonary/Chest: Effort normal. She has decreased breath sounds.  Abdominal: Soft. Normal appearance.  Musculoskeletal:  weakness  Neurological: She is alert.  Skin: Skin is warm, dry and intact. Bruising noted.  Bilateral cushion support to heels/ankles   Psychiatric: She has a normal mood and affect. Her speech is normal and behavior is normal. Judgment and thought content normal. Cognition and memory are normal.  Nursing note and vitals reviewed.   Vital Signs: BP (!) 91/51 (BP Location: Right Arm)   Pulse 77   Temp 99.2 F (37.3 C) (Oral)   Resp (!) 21   Ht 5' 8"  (1.727 m)   Wt 53.8 kg   SpO2 95%   BMI 18.03 kg/m  Pain Scale: 0-10   Pain Score: 0-No pain   SpO2: SpO2: 95 % O2 Device:SpO2: 95 % O2 Flow Rate: .O2 Flow Rate (L/min): 1 L/min  IO: Intake/output summary:   Intake/Output Summary (Last 24 hours) at 05/28/2018 1016 Last data filed at 05/28/2018 0100 Gross per 24 hour  Intake 83.82 ml  Output 2500 ml  Net -2416.18 ml    LBM: Last BM Date: 05/26/18 Baseline Weight: Weight: 49 kg Most recent weight: Weight: 53.8 kg     Palliative Assessment/Data: PPS 30%   Time In: 0900 Time Out: 1015 Time Total: 75 min.  Greater than 50%  of this time was spent counseling and coordinating care related to the above  assessment and plan.  Signed by:  Alda Lea, AGPCNP-BC Palliative Medicine Team  Phone: [phone] Fax: [phone] Pager: [phone] Amion: Bjorn Pippin    Please contact Palliative Medicine Team phone at [phone] for questions and concerns.  For individual provider: See Shea Evans

## 2018-05-28 NOTE — Progress Notes (Signed)
Pharmacy Antibiotic Note  [name] is a 77 y.o. female admitted on 05/26/2018 with possible pneumonia. Patient has ESRD on HD MWF with last HD on 10/12. Pharmacy has been consulted for cefepime and vancomycin dosing.  Pt received vancomycin loading dose after HD on 10/13, next HD scheduled for today.  Plan: -Adjust cefepime to 1g IV q24h as pt may require extra HD sessions -Continue vancomycin 500mg  IV qHD -Monitor HD schedule, LOT, cultures  Height: 5\' 8"  (172.7 cm) Weight: 118 lb 9.7 oz (53.8 kg) IBW/kg (Calculated) : 63.9  Temp (24hrs), Avg:97.9 F (36.6 C), Min:97.2 F (36.2 C), Max:99.2 F (37.3 C)  Recent Labs  Lab 05/26/18 2236 05/27/18 1031 05/28/18 0205  WBC 9.0  --  4.8  CREATININE 3.21* 3.51* 2.33*    Estimated Creatinine Clearance: 17.4 mL/min (A) (by C-G formula based on SCr of 2.33 mg/dL (H)).    Allergies  Allergen Reactions  . Doxycycline Nausea And Vomiting    Caused "DEATHLY NAUSEA AND VOMITING"  . Lipitor [Atorvastatin] Other (See Comments)    Stomach pain  . Strawberry Extract Rash    Antimicrobials this admission: Vancomycin (10/13)>>  Cefepime (10/13) >>    Microbiology results: 10/13 BCx: sent 10/13 Sputum: sent 10/13 MRSA PCR: positive  Thank you for allowing pharmacy to be a part of this patient's care.  Arrie Senate, PharmD, BCPS Clinical Pharmacist [phone] Please check AMION for all [place] numbers 05/28/2018

## 2018-05-28 NOTE — Progress Notes (Signed)
[place] TEAM 1 - Stepdown/ICU TEAM  [name]  DXI:[id number] DOB: [birth year] DOA: 05/26/2018 PCP: [name], MD    Brief Narrative:  77yo F w/ a hx of ESRD on hemodialysis, CAD, DM2, and CHF who presented w/ shortness of breath persisting even after an extra dialysis session on the day of her admit. She had a recent hospitalizations for a GI bleed status post blood transfusion, and a cardiac catheterization earlier this month which revealed extensive 2-vessel coronary artery disease being tx w/ medical management only.   Significant Events: 10/13 admit  Subjective: Alert and conversant and oriented. States she is feeling much better s/p HD yesterday. She denies current cp, n/v, abdom pain, or sob. She is very hungry.   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% via cath October 2019 6kg up in weight at presentation - for serial HD for volume removal - suspect due to noncompliance w/ fluid restriction - tx as per Nephrology   [facility] Weights   05/27/18 1340 05/27/18 1720 05/28/18 0529  Weight: 56.4 kg 53.9 kg 53.8 kg    CAD s/p 3V CABG 2010 - Mildly elevated troponin No sx to suggests ACS at this time   [name]  Had a reported episode of unstable VT 10/13 - spontaneously resolved - follow on tele   ESRD on M/W/F HD Care per Nephrology   Anemia of CKD In setting of known CAD would prefer Hgb 8.0 or >- will transfuse 1U PRBC at next HD session  Recent GIB No evidence of active bleeding at this time   HTN Not an active issue at this time   Low gradient AoS   DVT prophylaxis: SCDs Code Status: FULL CODE Family Communication: no family present at time of exam  Disposition Plan:   Consultants:  Nephrology   Antimicrobials:  None   Objective: Blood pressure (!) 91/51, pulse 77, temperature 99.2 F (37.3 C), temperature source Oral, resp. rate (!) 21, height 5\' 8"  (1.727 m), weight  53.8 kg, SpO2 95 %.  Intake/Output Summary (Last 24 hours) at 05/28/2018 1048 Last data filed at 05/28/2018 0100 Gross per 24 hour  Intake 83.82 ml  Output 2500 ml  Net -2416.18 ml   Filed Weights   05/27/18 1340 05/27/18 1720 05/28/18 0529  Weight: 56.4 kg 53.9 kg 53.8 kg    Examination: General: No acute respiratory distress Lungs: fine crackles B bases - no wheezing  Cardiovascular: Regular rate and rhythm - no M or rub  Abdomen: NT/ND, soft, thin, bs+, no mass  Extremities: trace B LE edema   CBC: Recent Labs  Lab 05/26/18 2236 05/28/18 0205  WBC 9.0 4.8  HGB 7.9* 7.1*  HCT 24.4* 23.5*  MCV 107.0* 109.3*  PLT 121* 767*   Basic Metabolic Panel: Recent Labs  Lab 05/26/18 2236 05/27/18 1031 05/28/18 0205  NA 126* 125* 134*  K 4.6 5.0 4.0  CL 94* 93* 100  CO2 24 22 26   GLUCOSE 156* 202* 95  BUN 31* 33* 14  CREATININE 3.21* 3.51* 2.33*  CALCIUM 8.5* 9.0 8.3*  MG  --  2.2  --   PHOS  --  3.7  --    GFR: Estimated Creatinine Clearance: 17.4 mL/min (A) (by C-G formula based on SCr of 2.33 mg/dL (H)).  Liver Function Tests: Recent Labs  Lab 05/27/18 1031  ALBUMIN 3.3*    HbA1C: Hgb A1c MFr Bld  Date/Time Value  Ref Range Status  05/15/2018 08:00 PM 6.0 (H) 4.8 - 5.6 % Final    Comment:    (NOTE)         Prediabetes: 5.7 - 6.4         Diabetes: >6.4         Glycemic control for adults with diabetes: <7.0   09/27/2016 03:09 AM 5.4 4.8 - 5.6 % Final    Comment:    (NOTE)         Pre-diabetes: 5.7 - 6.4         Diabetes: >6.4         Glycemic control for adults with diabetes: <7.0     CBG: Recent Labs  Lab 05/27/18 0240 05/27/18 0746 05/27/18 1058 05/28/18 0843  GLUCAP 118* 125* 208* 103*    Recent Results (from the past 240 hour(s))  MRSA PCR Screening     Status: Abnormal   Collection Time: 05/27/18  2:55 AM  Result Value Ref Range Status   MRSA by PCR POSITIVE (A) NEGATIVE Final    Comment: RESULT CALLED TO, READ BACK BY AND  VERIFIED WITH: RN, Nettie Elm. [id number] @0545  THANEY      Scheduled Meds: . calcitRIOL  0.5 mcg Oral Daily  . Chlorhexidine Gluconate Cloth  6 each Topical Q0600  . Chlorhexidine Gluconate Cloth  6 each Topical Q0600  . Chlorhexidine Gluconate Cloth  6 each Topical Q0600  . folic acid  1 mg Oral Daily  . insulin aspart  0-9 Units Subcutaneous TID WC  . mupirocin ointment  1 application Nasal BID  . rosuvastatin  5 mg Oral q1800  . sevelamer carbonate  1,600 mg Oral TID WC  . sodium chloride flush  3 mL Intravenous Q12H  . trimethoprim  50 mg Oral Daily  . vancomycin  500 mg Intravenous Q M,W,F-HD  . vitamin B-12  500 mcg Oral Daily     LOS: 1 day   Cherene Altes, MD [facility] Office  [phone] Pager - Text Page per Amion  If 7PM-7AM, please contact night-coverage per Amion 05/28/2018, 10:48 AM

## 2018-05-28 NOTE — Progress Notes (Signed)
Modified Barium Swallow Progress Note  Patient Details  Name: [name] MRN: [id number] Date of Birth: 08/08/1941  Today's Date: 05/28/2018  Modified Barium Swallow completed.  Full report located under Chart Review in the Imaging Section.  Brief recommendations include the following:  Clinical Impression  Pt presented with mild-mod pharyngeal dysphagia marked by slient penetration of thin liquids and puree.  Pharyngeal impairments were primarily due to decreased timing of airway closure further exacerbated by phaygneal weakness lead to penetration of thin to the vocal folds X1, and penetration above the vocal folds X2 with puree (one flash); all of which occurred without sensation. Use of compensatory chin tuck strategy in combination with small (volume) sips was effective to provide additional airway protection with deglutition of thin. No oral or pharyngeal impairments were noted with regular texture. Although MBSS does not diagnose below the level of the upper esophogeal sphincter, brief scan of esophogus suggestive of age-related dysmotilty. SLP educated pt and husband regarding importance of use of chin tuck with small sips with liquids, as well as recommendation to remain upright 90 degrees during and 30 mintues after meals, follow solids with liquid intermittently to increase motility. Given current presentation and demonstrated efficient use of chin tuck strategy with thin liquids, recommend regular texture diet, thin liquids with chin tuck, small sips and bites, slow rate, remain upright at 90 degrees during and at least 30 minutes following meals. ST will follow acutely to provide treatment including further training/reinforcement with compensatory strategies and pharyngeal strengthening exercises.    Swallow Evaluation Recommendations       SLP Diet Recommendations: Thin liquid;Regular solids   Liquid Administration via: Cup   Medication Administration: Whole meds with puree   Supervision: Patient able to self feed;Full supervision/cueing for compensatory strategies   Compensations: Slow rate;Small sips/bites;Chin tuck   Postural Changes: Seated upright at 90 degrees;Remain semi-upright after after feeds/meals (Comment)   Oral Care Recommendations: Oral care BID        Houston Siren 05/28/2018,3:07 PM   [name] Colvin Caroli.Ed Risk analyst [phone] Office [phone]

## 2018-05-28 NOTE — Progress Notes (Signed)
Son called and updated via phone. Very concerned about not being to see his mother while she was in dialysis today.  Requests that treatments and care be worked around his schedule, so that he can spend time with her. Expressed that due to his job requirements and his wife's chemo treatments, he can only come at 2pm. Charge updated and information passed on to primary RN in report.

## 2018-05-28 NOTE — Progress Notes (Signed)
    [facility]       Subjective: feeling better today, got 2.3 L off w /HD yest.     Objective       Vitals:   05/27/18 0045 05/27/18 0100 05/27/18 0145 05/27/18 0236  BP: (!) 106/53 (!) 109/57 (!) 96/52 119/63  Pulse: 69 72 69 79  Resp: (!) 25 (!) 27 15 18   Temp:    97.7 F (36.5 C)  TempSrc:    Oral  SpO2: 95% 97% 95% 98%  Weight:    55.6 kg  Height:       Physical Exam General: Frail elderly female NAD, retching, some ^wob Neck: +jvd Heart: RRR 2/6 SEM Lungs: bilat rales no wheezing Abdomen: soft NT/ND Extremities: 1+ LE edema Dialysis Access: RIJ TDC; L A AVF no bruit   Home meds: - alprazolam 1 mg hs - aspirin 81/ rosuvastatin 5 hs - losartan 25 qd/ carvediolol 12.5 TTSS - vitamins/ prns/ supplements - sevelamer carb 1.6gm tid  - trimethoprim 50 qd   Dialysis:[facility] MWF 4h  49kg  2/2.25  Hep 1000   LFA AVF (no bruit)/ R IJ TDC Hectorol 2 mcg  Mircera 100 mcg (last 10/07   Assessment/Plan: 1. SOB/ pulm edema/ vol overload - looks better today, plan short HD again today and tomorrow to get down to dry wt and perhaps below.  Patient has to limit fluid intake better than she is, discussed at length.  2. ESRD - on HD MWF. AVF unable to be salvaged, using R IJ TDC. To f/u [facility] for new access as outpatient. Next HD 10/7  3. Symptomatic Anemia - Hgb stable s/p 2U prbcs on 10/1  +FOBT 4. HTN/ CM EF 20-25%. BP's too low for ARB and BB therapy, will prob not be able to tolerate these. Have dc'd for now. May need midodrine.   5. MBD - Ca/Phos ok. Continue VDRA. No binders here.  6. CAD/ s/p 3V CABG 2010 7. AS - not severe by heart cath 8. Gen weakness - multifactorial 2/2 to above dx    Kelly Splinter MD [employer] pager [phone]   05/27/2018, 9:34 AM

## 2018-05-29 ENCOUNTER — Inpatient Hospital Stay (HOSPITAL_COMMUNITY): Payer: Medicare HMO

## 2018-05-29 LAB — TYPE AND SCREEN
ABO/RH(D): O NEG
Antibody Screen: NEGATIVE
Unit division: 0

## 2018-05-29 LAB — COMPREHENSIVE METABOLIC PANEL
ALK PHOS: 47 U/L (ref 38–126)
ALT: 16 U/L (ref 0–44)
AST: 18 U/L (ref 15–41)
Albumin: 2.5 g/dL — ABNORMAL LOW (ref 3.5–5.0)
Anion gap: 9 (ref 5–15)
BILIRUBIN TOTAL: 0.4 mg/dL (ref 0.3–1.2)
BUN: 8 mg/dL (ref 8–23)
CALCIUM: 8.4 mg/dL — AB (ref 8.9–10.3)
CO2: 28 mmol/L (ref 22–32)
CREATININE: 2.14 mg/dL — AB (ref 0.44–1.00)
Chloride: 100 mmol/L (ref 98–111)
GFR, EST AFRICAN AMERICAN: 25 mL/min — AB (ref >60)
GFR, EST NON AFRICAN AMERICAN: 21 mL/min — AB (ref >60)
Glucose, Bld: 130 mg/dL — ABNORMAL HIGH (ref 70–99)
Potassium: 3.2 mmol/L — ABNORMAL LOW (ref 3.5–5.1)
Sodium: 137 mmol/L (ref 135–145)
TOTAL PROTEIN: 5.3 g/dL — AB (ref 6.5–8.1)

## 2018-05-29 LAB — CBC
HEMATOCRIT: 25.5 % — AB (ref 36.0–46.0)
Hemoglobin: 8 g/dL — ABNORMAL LOW (ref 12.0–15.0)
MCH: 32.5 pg (ref 26.0–34.0)
MCHC: 31.4 g/dL (ref 30.0–36.0)
MCV: 103.7 fL — AB (ref 80.0–100.0)
NRBC: 0.4 % — AB (ref 0.0–0.2)
PLATELETS: 111 10*3/uL — AB (ref 150–400)
RBC: 2.46 MIL/uL — AB (ref 3.87–5.11)
RDW: 22.4 % — ABNORMAL HIGH (ref 11.5–15.5)
WBC: 5.5 10*3/uL (ref 4.0–10.5)

## 2018-05-29 LAB — GLUCOSE, CAPILLARY
GLUCOSE-CAPILLARY: 131 mg/dL — AB (ref 70–99)
Glucose-Capillary: 207 mg/dL — ABNORMAL HIGH (ref 70–99)
Glucose-Capillary: 97 mg/dL (ref 70–99)
Glucose-Capillary: 191 mg/dL — ABNORMAL HIGH (ref 70–99)

## 2018-05-29 LAB — BPAM RBC
Blood Product Expiration Date: 201911092359
ISSUE DATE / TIME: 201910141725
Unit Type and Rh: 9500

## 2018-05-29 MED ORDER — MIDODRINE HCL 5 MG PO TABS
10.0000 mg | ORAL_TABLET | Freq: Two times a day (BID) | ORAL | Status: DC
  Administered 2018-05-29 – 2018-05-30 (×2): 10 mg via ORAL
  Filled 2018-05-29 (×2): qty 2

## 2018-05-29 MED ORDER — HEPARIN SODIUM (PORCINE) 1000 UNIT/ML IJ SOLN
INTRAMUSCULAR | Status: AC
  Filled 2018-05-29: qty 3

## 2018-05-29 MED ORDER — MIDODRINE HCL 5 MG PO TABS
ORAL_TABLET | ORAL | Status: AC
  Filled 2018-05-29: qty 2

## 2018-05-29 NOTE — Progress Notes (Signed)
    [facility]       Subjective: feeling better only 1L off on HD yest, weighed standing 53kg today   Objective       Vitals:   05/27/18 0045 05/27/18 0100 05/27/18 0145 05/27/18 0236  BP: (!) 106/53 (!) 109/57 (!) 96/52 119/63  Pulse: 69 72 69 79  Resp: (!) 25 (!) 27 15 18   Temp:    97.7 F (36.5 C)  TempSrc:    Oral  SpO2: 95% 97% 95% 98%  Weight:    55.6 kg  Height:       Physical Exam General: Frail elderly female, stable, calm chron ill appearing and very frail pleaseant Neck: no jvd Heart: RRR 2/6 SEM Lungs: clear throughout Abdomen: soft NT/ND Extremities: no sig LE edema Dialysis Access: RIJ TDC; L A AVF no bruit   Home meds: - alprazolam 1 mg hs - aspirin 81/ rosuvastatin 5 hs - losartan 25 qd/ carvediolol 12.5 TTSS - vitamins/ prns/ supplements - sevelamer carb 1.6gm tid  - trimethoprim 50 qd   Dialysis:[facility] MWF 4h  49kg  2/2.25  Hep 1000   LFA AVF (no bruit)/ R IJ TDC Hectorol 2 mcg  Mircera 100 mcg (last 10/07   Assessment/Plan: 1. SOB/ pulm edema/ vol overload- improving after HD x 2, still up 3-4 over "dry wt" , not much volume on exam though today.  Plan HD today to get back on sched and pull vol down further if will tolerate.   2. ESRD - on HD MWF. AVF unable to be salvaged, using R IJ TDC. To f/u [facility] for new access as outpatient. HD today 3. Symptomatic Anemia - Hgb stable s/p 2U prbcs on 10/1  +FOBT 4. HTN/ CM EF 20-25% - BP's too low for ARB and BB therapy, have dc'd.   Will start midodrine 10 bid.  5. MBD - Ca/Phos ok. Continue VDRA. No binders here.  6. CAD/ s/p 3V CABG 2010 7. AS - not severe by heart cath 8. Gen weakness - multifactorial 2/2 to above dx. Poor prognosis. Have d/w patient and family at length over the weekend.    Kelly Splinter MD [employer] pager [phone]   05/27/2018, 9:34 AM

## 2018-05-29 NOTE — Care Management Note (Addendum)
Case Management Note  Patient Details  Name: JALEXIA LALLI MRN: [id number] Date of Birth: 02/21/1941  Subjective/Objective:              Readmission. Recent HD start, decline in health. Palliative consulted, now DNR, with MOST form. Would like follow up w palliative at DC.        Action/Plan:  Patient from home w spouse. Placed consult for PT OT after discussion w bedside nurse. Will follow for evals and discuss HH, DME and palliative needs.   Attempt to reach patient in room, off unit, will try to see again as time allows.    Expected Discharge Date:                  Expected Discharge Plan:     In-House Referral:     Discharge planning Services  CM Consult  Post Acute Care Choice:    Choice offered to:     DME Arranged:    DME Agency:     HH Arranged:    HH Agency:     Status of Service:  In process, will continue to follow  If discussed at Long Length of Stay Meetings, dates discussed:    Additional Comments:  Carles Collet, RN 05/29/2018, 10:31 AM

## 2018-05-29 NOTE — Progress Notes (Signed)
[facility] TEAM 1 - Stepdown/ICU TEAM  [name]  EZM:[id number] DOB: [birth year] DOA: 05/26/2018 PCP: [name], MD    Brief Narrative:  77yo F w/ a hx of ESRD on hemodialysis, CAD, DM2, and CHF who presented w/ shortness of breath persisting even after an extra dialysis session on the day of her admit. She had a recent hospitalizations for a GI bleed status post blood transfusion, and a cardiac catheterization earlier this month which revealed extensive 2-vessel coronary artery disease being tx w/ medical management only.   Significant Events: 10/13 admit  Subjective: Much more alert. Reports her appetite is very good. Denies cp, n/v, or abdom pain. Her SOB has fully resolved. Needs to get up and get moving. Needs additional HD tx per Nephrology.   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% via cath October 2019 6kg up in weight at presentation - for serial HD for volume removal - suspect due to noncompliance w/ fluid restriction - tx as per Nephrology - improving w/ serial HD txs    Filed Weights   05/28/18 0529 05/29/18 0500 05/29/18 0900  Weight: 53.8 kg 56.7 kg 53.3 kg    CAD s/p 3V CABG 2010 - Mildly elevated troponin No sx to suggests ACS at this time   [name]  Had a reported episode of unstable VT 10/13 - spontaneously resolved - follow on tele   ESRD on M/W/F HD Care per Nephrology   Anemia of CKD In setting of known CAD would prefer Hgb 8.0 or >- will transfuse 1U PRBC at next HD session  Recent GIB No evidence of active bleeding at this time   HTN Not an active issue at this time   Low gradient AoS  Underweight - Severe malnutrition in context of chronic illness Encouraged consistent appropriate intake   MRSA Screen +  DVT prophylaxis: SCDs Code Status: DNR - NO CODE Family Communication: no family present at time of exam  Disposition Plan: PT/OT evals - ambulate -  transition to tele bed   Consultants:  Nephrology   Antimicrobials:  None   Objective: Blood pressure 105/63, pulse 78, temperature 98 F (36.7 C), temperature source Oral, resp. rate 12, height 5\' 8"  (1.727 m), weight 53.3 kg, SpO2 95 %.  Intake/Output Summary (Last 24 hours) at 05/29/2018 1131 Last data filed at 05/29/2018 1026 Gross per 24 hour  Intake 1041 ml  Output 1000 ml  Net 41 ml   Filed Weights   05/28/18 0529 05/29/18 0500 05/29/18 0900  Weight: 53.8 kg 56.7 kg 53.3 kg    Examination: General: No acute respiratory distress - alert and pleasant  Lungs: CTA th/o w/ no wheezing  Cardiovascular: RRR Abdomen: NT/ND, soft, very thin, bs+, no mass  Extremities: no signif edema B LE   CBC: Recent Labs  Lab 05/26/18 2236 05/28/18 0205 05/29/18 0235  WBC 9.0 4.8 5.5  HGB 7.9* 7.1* 8.0*  HCT 24.4* 23.5* 25.5*  MCV 107.0* 109.3* 103.7*  PLT 121* 112* 503*   Basic Metabolic Panel: Recent Labs  Lab 05/27/18 1031 05/28/18 0205 05/29/18 0235  NA 125* 134* 137  K 5.0 4.0 3.2*  CL 93* 100 100  CO2 22 26 28   GLUCOSE 202* 95 130*  BUN 33* 14 8  CREATININE 3.51* 2.33* 2.14*  CALCIUM 9.0 8.3* 8.4*  MG 2.2  --   --   PHOS 3.7  --   --  GFR: Estimated Creatinine Clearance: 18.8 mL/min (A) (by C-G formula based on SCr of 2.14 mg/dL (H)).  Liver Function Tests: Recent Labs  Lab 05/27/18 1031 05/29/18 0235  AST  --  18  ALT  --  16  ALKPHOS  --  47  BILITOT  --  0.4  PROT  --  5.3*  ALBUMIN 3.3* 2.5*    HbA1C: Hgb A1c MFr Bld  Date/Time Value Ref Range Status  05/15/2018 08:00 PM 6.0 (H) 4.8 - 5.6 % Final    Comment:    (NOTE)         Prediabetes: 5.7 - 6.4         Diabetes: >6.4         Glycemic control for adults with diabetes: <7.0   09/27/2016 03:09 AM 5.4 4.8 - 5.6 % Final    Comment:    (NOTE)         Pre-diabetes: 5.7 - 6.4         Diabetes: >6.4         Glycemic control for adults with diabetes: <7.0     CBG: Recent Labs  Lab  05/27/18 1058 05/28/18 0843 05/28/18 1204 05/28/18 2343 05/29/18 0817  GLUCAP 208* 103* 133* 157* 131*    Recent Results (from the past 240 hour(s))  MRSA PCR Screening     Status: Abnormal   Collection Time: 05/27/18  2:55 AM  Result Value Ref Range Status   MRSA by PCR POSITIVE (A) NEGATIVE Final    Comment: RESULT CALLED TO, READ BACK BY AND VERIFIED WITH: RN, Nettie Elm. [id number] @0545  THANEY      Scheduled Meds: . calcitRIOL  0.5 mcg Oral Daily  . Chlorhexidine Gluconate Cloth  6 each Topical Q0600  . feeding supplement (NEPRO CARB STEADY)  237 mL Oral BID BM  . folic acid  1 mg Oral Daily  . insulin aspart  0-9 Units Subcutaneous TID WC  . mupirocin ointment  1 application Nasal BID  . rosuvastatin  5 mg Oral q1800  . sevelamer carbonate  1,600 mg Oral TID WC  . sodium chloride flush  3 mL Intravenous Q12H  . trimethoprim  50 mg Oral Daily  . vitamin B-12  500 mcg Oral Daily     LOS: 2 days   Jeanne Altes, MD [facility] Office  [phone] Pager - Text Page per Amion  If 7PM-7AM, please contact night-coverage per Amion 05/29/2018, 11:31 AM

## 2018-05-29 NOTE — Progress Notes (Signed)
Daily Progress Note   Patient Name: [name]       Date: 05/29/2018 DOB: 03-14-1941  Age: 77 y.o. MRN#: [id number] Attending Physician: Cherene Altes, MD Primary Care Physician: [name], MD Admit Date: 05/26/2018  Reason for Consultation/Follow-up: Establishing goals of care  Subjective: Patient sitting up in bed watching tv. States she had a good night and is supposed to have another dialysis session on today. She denies pain or discomfort. Denies shortness of breath. Continues to complain of weakness and fatigue even with laying in bed. Reports she ate a good breakfast and excited that she was able to have something.   No family at bedside. I briefly reviewed goals of care discussion with patient from yesterday. She was able to summarize meeting and her expressed goals. She confirmed wishes for  DNR/DNI and wishes to have palliative at discharge. She also stated her and her husband read through the advance directive packet that was given on yesterday and planned to complete with her son this afternoon when he comes. Advised to notify nursing staff once completed and the need for completion. She was somewhat tearful and verbalized he was only able to visit during certain times because his wife is battling colon cancer for the second time around and it has spread. She states she wants to spend all the time she can with him and feels that he is dealing with a lot emotionally with her being ill and his wife. Support was given.   Chart Reviewed.   Length of Stay: 2  Current Medications: Scheduled Meds:  . calcitRIOL  0.5 mcg Oral Daily  . Chlorhexidine Gluconate Cloth  6 each Topical Q0600  . feeding supplement (NEPRO CARB STEADY)  237 mL Oral BID BM  . folic acid  1 mg Oral  Daily  . insulin aspart  0-9 Units Subcutaneous TID WC  . mupirocin ointment  1 application Nasal BID  . rosuvastatin  5 mg Oral q1800  . sevelamer carbonate  1,600 mg Oral TID WC  . sodium chloride flush  3 mL Intravenous Q12H  . trimethoprim  50 mg Oral Daily  . vitamin B-12  500 mcg Oral Daily    Continuous Infusions: . sodium chloride      PRN Meds: sodium chloride, acetaminophen, ALPRAZolam, Gerhardt's butt cream,  ondansetron (ZOFRAN) IV, sodium chloride flush  Physical Exam  Constitutional: She is oriented to person, place, and time. Vital signs are normal. She is cooperative. She appears ill.  Thin and chronically ill appearing   Cardiovascular: Normal rate, regular rhythm, normal heart sounds and normal pulses.  Hypotensive at times   Pulmonary/Chest: Effort normal. She has decreased breath sounds.  Abdominal: Soft. Normal appearance and bowel sounds are normal.  Neurological: She is alert and oriented to person, place, and time.  Nursing note and vitals reviewed.           Vital Signs: BP 105/63 (BP Location: Right Arm)   Pulse 78   Temp 98 F (36.7 C) (Oral)   Resp 12   Ht 5\' 8"  (1.727 m)   Wt 53.3 kg   SpO2 95%   BMI 17.85 kg/m  SpO2: SpO2: 95 % O2 Device: O2 Device: Room Air O2 Flow Rate: O2 Flow Rate (L/min): 1 L/min  Intake/output summary:   Intake/Output Summary (Last 24 hours) at 05/29/2018 1024 Last data filed at 05/29/2018 0730 Gross per 24 hour  Intake 1038 ml  Output 1000 ml  Net 38 ml   LBM: Last BM Date: 05/26/18 Baseline Weight: Weight: 49 kg Most recent weight: Weight: 53.3 kg      Palliative Assessment/Data: PPS 30%   Flowsheet Rows     Most Recent Value  Intake Tab  Referral Department  Hospitalist  Unit at Time of Referral  Cardiac/Telemetry Unit  Palliative Care Primary Diagnosis  Cardiac  Date Notified  05/26/18  Palliative Care Type  New Palliative care  Reason for referral  Clarify Goals of Care  Date of Admission   05/26/18  Date first seen by Palliative Care  05/28/18  # of days Palliative referral response time  2 Day(s)  # of days IP prior to Palliative referral  0  Clinical Assessment  Psychosocial & Spiritual Assessment  Palliative Care Outcomes      Patient Active Problem List   Diagnosis Date Noted  . Volume overload 05/27/2018  . Dyspnea 05/27/2018  . Acute respiratory failure with hypoxia ([place]) 05/27/2018  . Acute respiratory distress 05/27/2018  . Acute encephalopathy 05/27/2018  . Aspiration pneumonia ([place]) 05/27/2018  . Pressure ulcer 05/19/2018  . Acute on chronic combined systolic and diastolic heart failure ([place]) 05/17/2018  . LBBB (left bundle branch block) 05/17/2018  . Aortic stenosis, moderate 05/17/2018  . NSTEMI (non-ST elevated myocardial infarction) ([place]) 05/16/2018  . Symptomatic anemia 05/15/2018  . Demand ischemia ([place]) 05/15/2018  . Protein-calorie malnutrition, severe 04/20/2018  . Pressure injury of skin 04/18/2018  . GI bleed 10/31/2016  . Blood loss anemia 10/31/2016  . AKI (acute kidney injury) ([place]) 10/31/2016  . CVA (cerebral vascular accident) ([place]) 09/26/2016  . ESRD (end stage renal disease) on dialysis ([place]) 06/23/2015  . Balance problem 12/22/2013  . Edema 12/28/2011  . CLAUDICATION 11/01/2010  . CAROTID ARTERY STENOSIS 10/29/2009  . AORTIC STENOSIS 09/17/2009  . Essential hypertension 11/17/2008  . DIASTOLIC HEART FAILURE, CHRONIC 11/17/2008  . Diabetes mellitus type II, non insulin dependent ([place]) 11/12/2008  . Other and unspecified hyperlipidemia 11/12/2008  . Anemia, chronic renal failure 11/12/2008  . Thrombocytopenia ([place]) 11/12/2008  . Anxiety state 11/12/2008  . Coronary atherosclerosis 11/12/2008  . UNSPEC COMBINED SYSTOLIC&DIASTOLIC HEART FAILURE 29/93/7169  . ESOPHAGEAL REFLUX 11/12/2008  . Chronic kidney disease (CKD), stage V ([place]) 11/12/2008  . LOW BACK PAIN, CHRONIC 11/12/2008  . INSOMNIA UNSPECIFIED 11/12/2008  Palliative Care Assessment & Plan   Patient Profile: 77 y.o. female admitted on 05/26/2018 from home with complaints of shortness of breath. 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 She was recently hospitalized for a GI bleed and received a blood transfusion. She also underwent cardiac catheterization which showed extensive 2 vessel CAD. EF 20-25%. Recommendations were medical management. Patient presents to ED with complaints of increasing shortness of breath. During her ED course she reported she underwent additional dialysis and had about 2 lbs up removed. Chest x-ray showed volume overload. Since admission patient has been followed by nephrology and receiving dialysis as appropriate. On 10/13 patient had an episode of unstable VTach which spontaneously resolved. SLP has also been consulted for possible aspiration. Palliative Medicine team consulted for goals of care discussion.   Recommendations/Plan:  DNR/DNI  Continue to treat the treatable.   Outpatient Palliative at discharge.   PMT will continue to follow.   Goals of Care and Additional Recommendations:  Limitations on Scope of Treatment: Full Scope Treatment  Code Status:    Code Status Orders  (From admission, onward)         Start     Ordered   05/28/18 0956  Do not attempt resuscitation (DNR)  Continuous    Question Answer Comment  In the event of cardiac or respiratory ARREST Do not call a "code blue"   In the event of cardiac or respiratory ARREST Do not perform Intubation, CPR, defibrillation or ACLS   In the event of cardiac or respiratory ARREST Use medication by any route, position, wound care, and other measures to relive pain and suffering. May use oxygen, suction and manual treatment of airway obstruction as needed for comfort.      05/28/18 0956        Code Status History    Date Active  Date Inactive Code Status Order ID Comments User Context   05/27/2018 1013 05/28/2018 0956 Partial Code [id number]  Radene Gunning, NP Inpatient   05/27/2018 0224 05/27/2018 1013 Full Code [id number]  Shelbie Proctor, MD Inpatient   05/15/2018 1824 05/19/2018 1553 Full Code [id number]  Karmen Bongo, MD ED   04/18/2018 0408 04/23/2018 1721 Full Code [id number]  Vianne Bulls, MD Inpatient   10/31/2016 1409 11/03/2016 2142 Full Code [id number]  Waldemar Dickens, MD ED   09/26/2016 2014 09/27/2016 1914 Full Code [id number]  Geradine Girt, DO Inpatient       Prognosis:   Unable to determine guarded to poor in the setting of ESRD on HD, EF 20-25%, CAD, aspiration, anemia, anxiety, arthritis, MI, hypertension, hyperlipidemia, protein calorie malnutrition, weight loss >10%, deconditioning, recent episode of V-tach, and anemia.   Discharge Planning:  To Be Determined-with outpatient palliative.   Care plan was discussed with patient and bedside RN.   Thank you for allowing the Palliative Medicine Team to assist in the care of this patient.   Total Time 35 min.  Prolonged Time Billed  NO       Greater than 50%  of this time was spent counseling and coordinating care related to the above assessment and plan.  Alda Lea, AGPCNP-BC Palliative Medicine Team  Pager: [phone] Amion: Bjorn Pippin   Please contact Palliative Medicine Team phone at [phone] for questions and concerns.

## 2018-05-29 NOTE — Progress Notes (Signed)
  Speech Language Pathology Treatment: Dysphagia  Patient Details Name: [name] MRN: [id number] DOB: August 14, 1941 Today's Date: 05/29/2018 Time: 9741-6384 SLP Time Calculation (min) (ACUTE ONLY): 13 min  Assessment / Plan / Recommendation Clinical Impression   Pt independently recalled safe swallow precautions (slow rate, thoroughly chew small bites/sips) as well as her recommended compensatory strategy (chin tuck). She reported increased appetite and no difficulty feeling choked or strangled since last ST visit. Given min verbal/visual cues, pt consistently implemented use of chin tuck strategy with thin liquids. No overt s/s aspiration were observed with thin or regular texture today. SLP further educated pt regarding aspiration risks and emphasized importance of continued use of chin tuck with thin liquids until follow up instrumental test can be completed. SLP will continue to provide treatment with diet safety and efficiency as well as introduction to pharyngeal strengthening exercises.    HPI HPI: [name] is a 77 y.o. female with medical history significant of end-stage renal disease on hemodialysis, coronary artery disease, type 2 diabetes, GERD, PNA, CHF presented to [facility] ED 10/12 with shortness of breath and palpations. Pt had recent admission (early October) for anemia requiring blood transfusion as well as a NSTEMI and notable echo for reduced EF to 20%. Per chart, RN noted pt coughing during meals yesterday (10/13). CXR revealed increasing infiltrate in the right luncg base suspicious for aspiration.      SLP Plan  Continue with current plan of care       Recommendations  Diet recommendations: Regular;Thin liquid Liquids provided via: Cup Medication Administration: Whole meds with puree Supervision: Patient able to self feed Compensations: Slow rate;Small sips/bites;Chin tuck Postural Changes and/or Swallow Maneuvers: Seated upright 90 degrees                Oral Care Recommendations: Oral care BID Follow up Recommendations: Other (comment)(TBD) SLP Visit Diagnosis: Dysphagia, unspecified (R13.10) Plan: Continue with current plan of care       Jettie Booze, Student SLP                Jettie Booze 05/29/2018, 12:29 PM

## 2018-05-29 NOTE — Progress Notes (Signed)
PT Cancellation Note  Patient Details Name: [name] MRN: [id number] DOB: [birth year]   Cancelled Treatment:    Reason Eval/Treat Not Completed: Patient at procedure or test/unavailable   Shary Decamp [facility] 05/29/2018, 2:57 PM [place] Pager [phone] Office [phone]

## 2018-05-30 DIAGNOSIS — I5043 Acute on chronic combined systolic (congestive) and diastolic (congestive) heart failure: Secondary | ICD-10-CM

## 2018-05-30 LAB — GLUCOSE, CAPILLARY
GLUCOSE-CAPILLARY: 129 mg/dL — AB (ref 70–99)
Glucose-Capillary: 124 mg/dL — ABNORMAL HIGH (ref 70–99)

## 2018-05-30 MED ORDER — NEPRO/CARBSTEADY PO LIQD: 237.0000 mL | Freq: Two times a day (BID) | ORAL | 0 refills | Status: DC

## 2018-05-30 MED ORDER — HEPARIN SODIUM (PORCINE) 1000 UNIT/ML DIALYSIS: 1000.0000 [IU] | Freq: Once | INTRAMUSCULAR | Status: DC

## 2018-05-30 MED ORDER — LUBIPROSTONE 24 MCG PO CAPS
24.0000 ug | ORAL_CAPSULE | ORAL | Status: DC
  Filled 2018-05-30: qty 1

## 2018-05-30 MED ORDER — MIDODRINE HCL 10 MG PO TABS: 10.0000 mg | ORAL_TABLET | Freq: Two times a day (BID) | ORAL | 0 refills | Status: DC

## 2018-05-30 NOTE — Evaluation (Signed)
Occupational Therapy Evaluation Patient Details Name: [name] MRN: [id number] DOB: [birth year] Today's Date: 05/30/2018    History of Present Illness Pt is a 77 y.o. F with significant PMH of ESRD on hemodialysis MWF, CAD, DM2, and CHF who presents with shortness of breath. Recent hospitalization for GI bleed s/p blood transfusion and cardiac catheterization earlier this month which revealed extensive 2 vessel coronary artery disease being treated with medical management only.   Clinical Impression   Pt typically walks household distances with a walker and performs her own ADL. She and her husband work together on housekeeping and cooking. Pt presents with generalized weakness and mild unsteadiness. She is currently functioning at a supervision level and will have her husband available as needed for assistance. No further OT needs.    Follow Up Recommendations  No OT follow up    Equipment Recommendations  None recommended by OT    Recommendations for Other Services       Precautions / Restrictions Precautions Precautions: Fall Precaution Comments: watch HR Restrictions Weight Bearing Restrictions: No      Mobility Bed Mobility Overal bed mobility: Modified Independent             General bed mobility comments: Use of bed rail but no physical assistance  Transfers Overall transfer level: Needs assistance Equipment used: Rolling walker (2 wheeled) Transfers: Sit to/from Stand Sit to Stand: Supervision         General transfer comment: for safety    Balance Overall balance assessment: Mild deficits observed, not formally tested                                         ADL either performed or assessed with clinical judgement   ADL Overall ADL's : At baseline                                       General ADL Comments: Overall functioning at a supervision level for safety. Educated in importance of weighing herself daily,  reduced salt and measuring fluid intake. Instructed in energy conservation.     Vision Patient Visual Report: No change from baseline       Perception     Praxis      Pertinent Vitals/Pain Pain Assessment: No/denies pain     Hand Dominance Right   Extremity/Trunk Assessment Upper Extremity Assessment Upper Extremity Assessment: Overall WFL for tasks assessed;Generalized weakness   Lower Extremity Assessment Lower Extremity Assessment: Defer to PT evaluation RLE Sensation: history of peripheral neuropathy LLE Sensation: history of peripheral neuropathy       Communication Communication Communication: No difficulties   Cognition Arousal/Alertness: Awake/alert Behavior During Therapy: WFL for tasks assessed/performed Overall Cognitive Status: Within Functional Limits for tasks assessed                                 General Comments: Very pleasant and eager to walk   General Comments  Pt husband present    Exercises     Shoulder Instructions      Home Living Family/patient expects to be discharged to:: Private residence Living Arrangements: Spouse/significant other Available Help at Discharge: Family;Available 24 hours/day Type of Home: House Home Access: Level entry  Home Layout: One level(1 step with rail to enter living room)     Bathroom Shower/Tub: Tub/shower unit   Bathroom Toilet: Standard     Home Equipment: Walker - 2 wheels;Grab bars - tub/shower;Toilet riser          Prior Functioning/Environment Level of Independence: Independent with assistive device(s)        Comments: sponge bathes, household ambulator with walker, difficulty standing for prolonged periods        OT Problem List:        OT Treatment/Interventions:      OT Goals(Current goals can be found in the care plan section) Acute Rehab OT Goals Patient Stated Goal: "walk more."  OT Frequency:     Barriers to D/C:            Co-evaluation               AM-PAC PT "6 Clicks" Daily Activity     Outcome Measure Help from another person eating meals?: None Help from another person taking care of personal grooming?: A Little Help from another person toileting, which includes using toliet, bedpan, or urinal?: A Little Help from another person bathing (including washing, rinsing, drying)?: A Little Help from another person to put on and taking off regular upper body clothing?: None Help from another person to put on and taking off regular lower body clothing?: A Little 6 Click Score: 20   End of Session Equipment Utilized During Treatment: Gait belt;Rolling walker  Activity Tolerance: Patient tolerated treatment well Patient left: in bed;with call bell/phone within reach  OT Visit Diagnosis: Unsteadiness on feet (R26.81)                Time: 9518-8416 OT Time Calculation (min): 16 min Charges:  OT General Charges $OT Visit: 1 Visit OT Evaluation $OT Eval Moderate Complexity: 1 Mod  {Valdis Bevill, Haze Boyden 05/30/2018, 11:40 AM  Nestor Lewandowsky, OTR/L Acute Rehabilitation Services Pager: [phone] Office: [phone]

## 2018-05-30 NOTE — Progress Notes (Signed)
ED CSW received phone call pertaining pt's discharge and questions about Palliative Care. CSW reviewed pt's chart. Per note from NP with Palliative Care on 10/14, pt was offered Hospice and Palliative Care Services. CSW awaiting call back from [facility]ospice and Pall[facility] Care on call nurse to notify them of pt's discharge today.   [name], [facility]  [phone]

## 2018-05-30 NOTE — Consult Note (Addendum)
Cardiology Consultation:   Patient ID: KALI DEADWYLER; [id number]; 05-Aug-1941   Admit date: 05/26/2018 Date of Consult: 05/30/2018  Primary Care Provider: [name], MD Primary Cardiologist: Truitt Merle , NP-C  Patient Profile:   [name] is a 77 y.o. 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 Erlinda Hong.   History of Present Illness:   Ms. Leonhart is a 77 year old female with a history stated above who presented to [facility] on 05/26/2018 with complaints of worsening shortness of breath. She was last seen by Kathrynn Humble on 05/15/2018 in the office for after approximately 1 week of shortness of breath, more fatigue and chest pain with heart pounding. Given the above symptoms, was admitted to the hospital for further evaluation. She underwent a full cardiac work-up including cardiac catheterization which showed extensive two-vessel coronary artery disease with recommendations for continued medical management and secondary prevention. She had an echocardiogram with a new decrease in her LV function with an LVEF noted to be 20 to 25%.  Cardiology was consulted during that time and did not feel that this was ischemic in nature.   She presented back to the hospital on 05/26/2017 with worsening shortness of breath however denies chest pain, recent illness, fever, chills, cough, LE swelling, nausea or vomiting, dizziness, presyncope or syncopal episodes. She reports compliance with medications, dialysis and fluid and salt restrictions.   In the ED, CXR showed fluid volume overload.  On presentation, BNP was noted to be markedly elevated at greater than 4000.  She was admitted to hospitalist service given several comorbid conditions for fluid volume  management and dialysis treatment.   Cardiology was once again consulted for evaluation of fluid management.   Past Medical History:  Diagnosis Date  . Anemia    Pt is taking iron.   [name] Anxiety   . Arthritis   . Carotid stenosis    40-59% bilateral ICA stenosis in 2/12.  . Chronic low back pain   . CKD (chronic kidney disease)    Dr. Audie Clear at [facility]  . Coronary artery disease    Pt presented 2/10 to [facility] with NSTEMI and diastolic CHF exacerbation.  LHC was done  3/10 showing 99% pRCA stenosis and 80% calcified pLAD stenosis with L=>R collaterals.  Pt was referred  for CABG which was done by Dr. Prescott Gum with LIMA-LAD, SVG-RCA, SVG-OM.  . Diabetes mellitus   . Diabetic neuropathy ([place])   . Diastolic CHF (HCC)    Echo (2/10) showed EF 55-65%, mild LVH, diastolic dysfunction, mild AS with mean gradient 12 mmHg, PASP 43 mmHg.  Echo (2/12): EF 55-60%, mild LVH, mild AS (mean gradient 12), PA systolic pressure 32 mmHg.     [name] GERD (gastroesophageal reflux disease)   . Heart murmur   . Hyperlipidemia   . Hypertension   . Mild aortic stenosis    mean gradient 12 mmHg in 2/12.  . Myocardial infarction ([place])    "mild"  . Pneumonia   . PONV (postoperative nausea and vomiting)   . Thrombocytopenia ([place])   . Unsteady gait     Past Surgical History:  Procedure Laterality Date  . AV FISTULA PLACEMENT Left 01/30/2015   Procedure: Creation of a  Radial Cephalic AV Fistula left wrist;  Surgeon: [name], MD;  Location: [place];  Service: Vascular;  Laterality: Left;  . BACK SURGERY     multiple  . BREAST SURGERY     biopsy  . CARDIAC CATHETERIZATION    . CATARACT EXTRACTION W/ INTRAOCULAR LENS  IMPLANT, BILATERAL    . COLONOSCOPY Left 11/03/2016   Procedure: COLONOSCOPY;  Surgeon: [name], MD;  Location: [facility] ENDOSCOPY;  Service: Endoscopy;  Laterality: Left;  . COLONOSCOPY W/ BIOPSIES AND POLYPECTOMY    . CORONARY ARTERY BYPASS GRAFT  09/2008   pt with NSTEMI  and diastolic CHF exacerbation.  LHC was done  3/10 showing 99% pRCA stenosis and 80% calcified pLAD stenosis with L=>R collaterals.  Pt was referred  for CABG which was done by Dr. Prescott Gum with LIMA-LAD, SVG-RCA, SVG-OM.  [name] ESOPHAGOGASTRODUODENOSCOPY N/A 11/02/2016   Procedure: ESOPHAGOGASTRODUODENOSCOPY (EGD);  Surgeon: [name], MD;  Location: [facility] ENDOSCOPY;  Service: Endoscopy;  Laterality: N/A;  . GIVENS CAPSULE STUDY N/A 11/29/2016   Procedure: GIVENS CAPSULE STUDY;  Surgeon: [name], MD;  Location: [place];  Service: Endoscopy;  Laterality: N/A;  . REVISON OF ARTERIOVENOUS FISTULA Left 07/02/2015   Procedure: REVISON OF LEFT RADIOCEPHALIC ARTERIOVENOUS FISTULA;  Surgeon: [name], MD;  Location: [place];  Service: Vascular;  Laterality: Left;  . RIGHT/LEFT HEART CATH AND CORONARY/GRAFT ANGIOGRAPHY N/A 05/18/2018   Procedure: RIGHT/LEFT HEART CATH AND CORONARY/GRAFT ANGIOGRAPHY;  Surgeon: Leonie Man, MD;  Location: [place] CV LAB;  Service: Cardiovascular;  Laterality: N/A;     Prior to Admission medications   Medication Sig Start Date End Date Taking? Authorizing Provider  ALPRAZolam Duanne Moron) 1 MG tablet Take 1 mg by mouth at bedtime.  11/18/17  Yes [provider]  AMITIZA 24 MCG capsule Take 24 mcg by mouth See admin instructions. Take 24 mcg by mouth two times a day, every other day 06/05/17  Yes [provider]  aspirin 81 MG tablet Take 1 tablet (81 mg total) by mouth daily. 05/19/18  Yes Samuella Cota, MD  b complex-vitamin c-folic acid (NEPHRO-VITE) 0.8 MG TABS tablet Take 1 tablet by mouth daily. 05/01/18  Yes [provider]  calcitRIOL (ROCALTROL) 0.5 MCG capsule Take 0.5 mcg by mouth daily. 04/09/18  Yes [provider]  carvedilol (COREG) 12.5 MG tablet Take 1 tablet (12.5 mg total) by mouth every Tuesday, Thursday, Saturday, and Sunday. 05/19/18  Yes Samuella Cota, MD  folic acid (FOLVITE) 1 MG tablet Take 1 tablet  (1 mg total) by mouth daily. 04/24/18  Yes [name], MD  losartan (COZAAR) 25 MG tablet Take 1 tablet (25 mg total) by mouth daily. 04/22/14  Yes Larey Dresser, MD  rosuvastatin (CRESTOR) 5 MG tablet TAKE 1 TABLET(5 MG) BY MOUTH DAILY Patient taking differently: Take 5 mg by mouth at bedtime.  06/27/17  Yes [name], NP  sevelamer carbonate (RENVELA) 800 MG tablet Take 1,600 mg 3 (three) times daily with meals by mouth.  12/06/16  Yes [provider]  trimethoprim (TRIMPEX) 100 MG tablet Take 50 mg by mouth daily.  11/07/14  Yes [provider]  vitamin B-12 (CYANOCOBALAMIN) 500 MCG tablet Take 1 tablet (500 mcg total) by mouth daily. 04/23/18  Yes [name], MD  metoprolol succinate (TOPROL-XL) 50 MG 24 hr tablet Take 50 mg by mouth 2 (two) times daily. Take with or immediately following a meal.  05/15/18  [provider]    Inpatient Medications: Scheduled Meds: . calcitRIOL  0.5 mcg Oral Daily  . Chlorhexidine Gluconate Cloth  6 each Topical Q0600  . feeding supplement (NEPRO CARB STEADY)  237 mL Oral BID BM  . folic acid  1 mg Oral Daily  . [START ON 05/31/2018] heparin  1,000 Units Dialysis Once in dialysis  . insulin aspart  0-9 Units Subcutaneous TID WC  . lubiprostone  24 mcg Oral QODAY  . lubiprostone  24 mcg Oral Q48H  . midodrine  10 mg Oral BID WC  . mupirocin ointment  1 application Nasal BID  . rosuvastatin  5 mg Oral q1800  . sevelamer carbonate  1,600 mg Oral TID WC  . sodium chloride flush  3 mL Intravenous Q12H  . trimethoprim  50 mg Oral Daily  . vitamin B-12  500 mcg Oral Daily   Continuous Infusions: . sodium chloride     PRN Meds: sodium chloride, acetaminophen, ALPRAZolam, Gerhardt's butt cream, ondansetron (ZOFRAN) IV, sodium chloride flush  Allergies:    Allergies  Allergen Reactions  . Doxycycline Nausea And Vomiting    Caused "DEATHLY NAUSEA AND VOMITING"  . Lipitor [Atorvastatin] Other (See Comments)    Stomach  pain  . Strawberry Extract Rash    Social History:   Social History   Socioeconomic History  . Marital status: Married    Spouse name: Not on file  . Number of children: Not on file  . Years of education: Not on file  . Highest education level: Not on file  Occupational History  . Not on file  Social Needs  . Financial resource strain: Not on file  . Food insecurity:    Worry: Not on file    Inability: Not on file  . Transportation needs:    Medical: Not on file    Non-medical: Not on file  Tobacco Use  . Smoking status: Former Smoker    Types: Cigarettes  . Smokeless tobacco: Never Used  . Tobacco comment: quit 1988  Substance and Sexual Activity  . Alcohol use: Yes    Comment: beer daily  . Drug use: No  . Sexual activity: Never    Partners: Male  Lifestyle  . Physical activity:    Days per week: Not on file    Minutes per session: Not on file  . Stress: Not on file  Relationships  . Social connections:    Talks on phone: Not on file    Gets together: Not on file    Attends religious service: Not on file    Active member of club or organization: Not on file    Attends meetings of clubs or organizations: Not on file    Relationship status: Not on file  . Intimate partner violence:    Fear of current or ex partner: Not on file    Emotionally abused: Not on file    Physically abused: Not on file    Forced sexual activity: Not on file  Other Topics Concern  . Not on file  Social History Narrative  . Not on file    Family History:   Family History  Adopted: Yes  Family history unknown: Yes   Family Status:  Family Status  Relation Name Status  . Son  Alive  . Mother  Deceased  . Father  Deceased  . MGM  Deceased  . MGF  Deceased  . PGM  Deceased  . PGF  Deceased    ROS:  Please see the history of present illness.  All other ROS reviewed and negative.     Physical Exam/Data:   Vitals:   05/29/18 2324 05/30/18 0400 05/30/18 0640 05/30/18  1252  BP: (!) 98/59 (!) 92/48  (!) 107/56  Pulse: 80 73  77  Resp: (!) 21 17  19   Temp: 98.7 F (37.1 C) 98.3 F (36.8 C)  98.2 F (36.8 C)  TempSrc: Oral Oral  Oral  SpO2: 96% 96%  100%  Weight:   52 kg   Height:        Intake/Output Summary (Last 24 hours) at 05/30/2018 1421 Last data filed at 05/29/2018 2300 Gross per 24 hour  Intake 243 ml  Output 3000 ml  Net -2757 ml   Filed Weights   05/29/18 1423 05/29/18 1723 05/30/18 0640  Weight: 54 kg 51 kg 52 kg   Body mass index is 17.43 kg/m.   General: Frail, elderly,  NAD Skin: Warm, dry, intact  Head: Normocephalic, atraumatic, clear, moist mucus membranes. Neck: Negative for carotid bruits. No JVD Lungs:Clear to ausculation bilaterally. No wheezes, rales, or rhonchi. Breathing is unlabored. Cardiovascular: RRR with S1 S2. + murmur. No rubs, gallops, or LV heave appreciated. Abdomen: Soft, non-tender, non-distended with normoactive bowel sounds. No obvious abdominal masses. MSK: Strength and tone appear normal for age. 5/5 in all extremities Extremities: No edema. No clubbing or cyanosis. DP/PT pulses 2+ bilaterally Neuro: Alert and oriented. No focal deficits. No facial asymmetry. MAE spontaneously. Psych: Responds to questions appropriately with normal affect.     EKG:  The EKG was personally reviewed and demonstrates:  05/16/18 NSR with known LBBB HR 104 Telemetry:  Telemetry was personally reviewed and demonstrates: 05/30/18 NSR HR 90's   Relevant CV Studies:  2D Echo 05/16/18 Study Conclusions  - Left ventricle: The cavity size was normal. Wall thickness was increased in a pattern of mild LVH. Systolic function was severely reduced. The estimated ejection fraction was in the range of 20% to 25%. Diffuse hypokinesis with relative preservation of the basal anterior wall and the basal to mid lateral wall. Septal-lateral dyssynychrony. Doppler parameters are consistent with restrictive physiology,  indicative of decreased left ventricular diastolic compliance and/or increased left atrial pressure. E/medial e&' >15 suggests LV end diastolic pressure at least 20 mmHg. 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 There was trivial regurgitation. Overall, suspect moderate, low gradient aortic stenosis. Visually does not appear severe (not marked turbulence). Mean gradient (S): 11 mm Hg. Valve area (VTI): 1.01 cm^2. - Mitral valve: Mildly calcified annulus. There was moderate regurgitation. - Left atrium: The atrium was moderately dilated. - Right ventricle: The cavity size was normal. Systolic function was mildly to moderately reduced. - Right atrium: The atrium was moderately dilated. - Tricuspid valve: There was moderate regurgitation. Peak RV-RA gradient (S): 39 mm Hg. - Pulmonary arteries: PA peak pressure: 47 mm Hg (S). - Systemic veins: IVC measured 2.0 cm with < 50% respirophasic variation, suggesting RA pressure 8 mmHg.  Impressions:  - Normal LV size with mild LV hypertrophy. EF 20-25% with wall motion abnormalities as noted above. Restrictive diastolic function. Normal RV size with mild to moderate systolic dysfunction. Moderate MR, moderate TR. Mild pulmonary hypertension. The aortic valve appeared functionally bicuspid (left and right cusps fused). There was severe calcification. Mean gradient 11 with AVA 1.01 cm^2 by continuity equation. Visually, think low gradient moderate aortic stenosis is most likely.  Echo Study Conclusions 09/2016  - Left ventricle: The cavity size was normal. There was moderate focal basal and mild concentric hypertrophy. Systolic function was normal. The estimated ejection fraction was in the range of 55% to 60%. Wall motion was normal; there were no regional wall motion abnormalities. Features are  consistent with a pseudonormal left ventricular filling pattern, with concomitant abnormal relaxation and increased filling pressure (grade 2 diastolic dysfunction). - Aortic valve: The AV appears mildly to moderately stenotic by doppler with a mean AV gradient of 58mmHg. The calculated AVA is 0.9cm2 and likely underestimates with AVA due to inaccurate measurment of the LVOT. Visually there appears to be mild to moderate AS. Valve area (VTI): 0.9 cm^2. Valve area (Vmax): 0.89 cm^2. Valve area (Vmean): 0.86 cm^2. - Mitral valve: There was trivial regurgitation. - Right ventricle: Systolic function was mildly reduced. - Pulmonic valve: There was mild regurgitation. - Pulmonary arteries: PA peak pressure: 33 mm Hg (S).   Myoview Study Highlights 02/2016    Nuclear stress EF: 54%. There is mid inferior wall hypokinesis  There was no ST segment deviation noted during stress.  Defect 1: There is a medium defect of moderate severity present in the basal inferior and mid inferior location.  The mid inferior wall perfusion defect is reversible consistent with ischemia. The basal inferior wall defect is mostly fixed consistent with infarct.  This is an intermediate risk study. Consider occlusion of SVG to RCA graft.    Carotid Doppler Summary 09/2016: Findings consistent with a high end 1- 39 percent stenosis involving the right internal carotid artery and the left internal carotid artery. Can not rule out higher grade stenosis due to severe amount of shadowing plaque in bilateral carotid arteries and difficulty visualizing vessels. Severe amount of plaque are subjectively suggestive of a 40-59% percent stenosis bilaterally. The right vertebral demonstrates atypical flow, left vertebral is patent and antegrade.  Laboratory Data:  Chemistry Recent Labs  Lab 05/27/18 1031 05/28/18 0205 05/29/18 0235  NA 125* 134* 137  K 5.0 4.0 3.2*  CL 93* 100 100  CO2 22  26 28   GLUCOSE 202* 95 130*  BUN 33* 14 8  CREATININE 3.51* 2.33* 2.14*  CALCIUM 9.0 8.3* 8.4*  GFRNONAA 12* 19* 21*  GFRAA 14* 22* 25*  ANIONGAP 10 8 9     Total Protein  Date Value Ref Range Status  05/29/2018 5.3 (L) 6.5 - 8.1 g/dL Final  12/19/2017 6.1 6.0 - 8.5 g/dL Final  08/03/2016 7.1 6.4 - 8.3 g/dL Final   Albumin  Date Value Ref Range Status  05/29/2018 2.5 (L) 3.5 - 5.0 g/dL Final  12/19/2017 3.8 3.5 - 4.8 g/dL Final  08/03/2016 3.8 3.5 - 5.0 g/dL Final   AST  Date Value Ref Range Status  05/29/2018 18 15 - 41 U/L Final  08/03/2016 19 5 - 34 U/L Final   ALT  Date Value Ref Range Status  05/29/2018 16 0 - 44 U/L Final  08/03/2016 15 0 - 55 U/L Final   Alkaline Phosphatase  Date Value Ref Range Status  05/29/2018 47 38 - 126 U/L Final  08/03/2016 66 40 - 150 U/L Final   Total Bilirubin  Date Value Ref Range Status  05/29/2018 0.4 0.3 - 1.2 mg/dL Final  08/03/2016 0.43 0.20 - 1.20 mg/dL Final   Bilirubin Total  Date Value Ref Range Status  12/19/2017 0.4 0.0 - 1.2 mg/dL Final   Hematology Recent Labs  Lab 05/26/18 2236 05/28/18 0205 05/29/18 0235  WBC 9.0 4.8 5.5  RBC 2.28* 2.15* 2.46*  HGB 7.9* 7.1* 8.0*  HCT 24.4* 23.5* 25.5*  MCV 107.0* 109.3* 103.7*  MCH 34.6* 33.0 32.5  MCHC 32.4 30.2 31.4  RDW 18.5* 18.4* 22.4*  PLT 121* 112* 111*   Cardiac EnzymesNo results for input(s): TROPONINI in the last 168 hours.  Recent Labs  Lab 05/26/18 2240  TROPIPOC 0.10*    BNP Recent Labs  Lab 05/26/18 2237  BNP 4,003.0*    DDimer No results for input(s): DDIMER in the last 168 hours. TSH:  Lab Results  Component Value Date   TSH 2.185 01/17/2014   Lipids: Lab Results  Component Value Date   CHOL 81 (L) 12/19/2017   HDL 56 12/19/2017   LDLCALC 11 12/19/2017   LDLDIRECT 34.4 11/17/2008   TRIG 68 12/19/2017   CHOLHDL 1.4 12/19/2017   HgbA1c: Lab Results  Component Value Date   HGBA1C 6.0 (H) 05/15/2018   Radiology/Studies:  Dg  Chest 2 View  Result Date: 05/26/2018 CLINICAL DATA:  77 year old female with history of increasing shortness of breath. Hypoxemia. EXAM: CHEST - 2 VIEW COMPARISON:  Chest x-ray 05/15/2018. FINDINGS: Right internal jugular PermCath with tip terminating at the superior cavoatrial junction. Status post median sternotomy for CABG. Lung volumes are normal. There is cephalization of the pulmonary vasculature and slight indistinctness of the interstitial markings suggestive of mild pulmonary edema. Small bilateral pleural effusions. Mild cardiomegaly. Upper mediastinal contours are within normal limits. Aortic atherosclerosis. IMPRESSION: 1. The appearance the chest suggests congestive heart failure, as above. 2. Aortic atherosclerosis. 3. Postoperative changes and support apparatus, as above. Electronically Signed   By: Vinnie Langton M.D.   On: 05/26/2018 23:12   Dg Chest Port 1 View  Result Date: 05/29/2018 CLINICAL DATA:  Follow-up exam.  Vas-Cath present. EXAM: PORTABLE CHEST 1 VIEW COMPARISON:  May 27, 2018 FINDINGS: The Vas-Cath is stable. There is a small right pleural effusion with underlying atelectasis. Mild pulmonary venous congestion/mild edema. Stable cardiomediastinal silhouette. No other change. IMPRESSION: Cardiomegaly and pulmonary venous congestion/mild edema. Stable small right effusion. Electronically Signed   By: Dorise Bullion III M.D   On: 05/29/2018 09:17   Dg Chest Port 1 View  Result Date: 05/27/2018 CLINICAL DATA:  Status post arrest EXAM: PORTABLE CHEST 1 VIEW COMPARISON:  07/23/2009 FINDINGS: Cardiac shadow is enlarged but stable. Postsurgical changes are again seen. Dialysis catheter is again noted and stable. Increasing right-sided pleural effusion and right basilar infiltrate is noted when compare with the prior exam. Mild vascular congestion interstitial edema is noted as well. IMPRESSION: Changes of mild CHF. Increasing infiltrate in the right base. This may be  related to the given clinical history of aspiration. Electronically Signed   By: Inez Catalina M.D.   On: 05/27/2018 11:38   Dg Swallowing Func-speech Pathology  Result Date: 05/28/2018 Objective Swallowing Evaluation: Type of Study: MBS-Modified Barium Swallow Study  Patient Details Name: CHANELLE HODSDON MRN: [id number] Date of Birth: 01-22-1941 Today's Date: 05/28/2018 Time: SLP Start Time (ACUTE ONLY): 1006 -SLP Stop Time (ACUTE ONLY): 1017 SLP Time Calculation (min) (ACUTE ONLY): 11 min Past Medical History: Past Medical History: Diagnosis Date . Anemia   Pt is taking iron.  [name] Anxiety  . Arthritis  . Carotid stenosis   40-59% bilateral ICA stenosis in 2/12. . Chronic low back pain  . CKD (chronic kidney disease)   Dr. Audie Clear at [facility] . Coronary artery disease   Pt presented 2/10 to [facility]  with NSTEMI and diastolic CHF exacerbation.  LHC was done  3/10 showing 99% pRCA stenosis and 80% calcified pLAD stenosis with L=>R collaterals.  Pt was referred  for CABG which was done by Dr. Prescott Gum with LIMA-LAD, SVG-RCA, SVG-OM. . Diabetes mellitus  . Diabetic neuropathy ([place])  . Diastolic CHF (HCC)   Echo (2/10) showed EF 55-65%, mild LVH, diastolic dysfunction, mild AS with mean gradient 12 mmHg, PASP 43 mmHg.  Echo (2/12): EF 55-60%, mild LVH, mild AS (mean gradient 12), PA systolic pressure 32 mmHg.    [name] GERD (gastroesophageal reflux disease)  . Heart murmur  . Hyperlipidemia  . Hypertension  . Mild aortic stenosis   mean gradient 12 mmHg in 2/12. . Myocardial infarction ([place])   "mild" . Pneumonia  . PONV (postoperative nausea and vomiting)  . Thrombocytopenia ([place])  . Unsteady gait  Past Surgical History: Past Surgical History: Procedure Laterality Date . AV FISTULA PLACEMENT Left 01/30/2015  Procedure: Creation of a Radial Cephalic AV Fistula left wrist;  Surgeon: [name], MD;  Location: [place];  Service: Vascular;  Laterality: Left; . BACK SURGERY    multiple . BREAST SURGERY    biopsy .  CARDIAC CATHETERIZATION   . CATARACT EXTRACTION W/ INTRAOCULAR LENS  IMPLANT, BILATERAL   . COLONOSCOPY Left 11/03/2016  Procedure: COLONOSCOPY;  Surgeon: [name], MD;  Location: [facility] ENDOSCOPY;  Service: Endoscopy;  Laterality: Left; . COLONOSCOPY W/ BIOPSIES AND POLYPECTOMY   . CORONARY ARTERY BYPASS GRAFT  09/2008  pt with NSTEMI and diastolic CHF exacerbation.  LHC was done  3/10 showing 99% pRCA stenosis and 80% calcified pLAD stenosis with L=>R collaterals.  Pt was referred  for CABG which was done by Dr. Prescott Gum with LIMA-LAD, SVG-RCA, SVG-OM. [name] ESOPHAGOGASTRODUODENOSCOPY N/A 11/02/2016  Procedure: ESOPHAGOGASTRODUODENOSCOPY (EGD);  Surgeon: [name], MD;  Location: [facility] ENDOSCOPY;  Service: Endoscopy;  Laterality: N/A; . GIVENS CAPSULE STUDY N/A 11/29/2016  Procedure: GIVENS CAPSULE STUDY;  Surgeon: [name], MD;  Location: [place];  Service: Endoscopy;  Laterality: N/A; . REVISON OF ARTERIOVENOUS FISTULA Left 07/02/2015  Procedure: REVISON OF LEFT RADIOCEPHALIC ARTERIOVENOUS FISTULA;  Surgeon: [name], MD;  Location: [place];  Service: Vascular;  Laterality: Left; . RIGHT/LEFT HEART CATH AND CORONARY/GRAFT ANGIOGRAPHY N/A 05/18/2018  Procedure: RIGHT/LEFT HEART CATH AND CORONARY/GRAFT ANGIOGRAPHY;  Surgeon: Leonie Man, MD;  Location: [place] CV LAB;  Service: Cardiovascular;  Laterality: N/A; HPI: DALYA MASELLI is a 77 y.o. female with medical history significant of end-stage renal disease on hemodialysis, coronary artery disease, type 2 diabetes, GERD, PNA, CHF presented to [facility] ED 10/12 with shortness of breath and palpations. Pt had recent admission (early October) for anemia requiring blood transfusion as well as a NSTEMI and notable echo for reduced EF to 20%. Per chart, RN noted pt coughing during meals yesterday (10/13). CXR revealed increasing infiltrate in the right luncg base suspicious for aspiration.  No data recorded Assessment / Plan / Recommendation CHL IP CLINICAL  IMPRESSIONS 05/28/2018 Clinical Impression Pt presented with mild-mod pharyngeal dysphagia marked by slient penetration of thin liquids and puree.  Pharyngeal impairments were primarily due to decreased timing of airway closure further exacerbated by phaygneal weakness lead to penetration of thin to the vocal folds X1, and penetration above the vocal folds X2 with puree (one flash); all of which occurred without sensation. Use of compensatory chin tuck strategy in combination with small (volume) sips was effective to provide additional airway protection with deglutition  of thin. No oral or pharyngeal impairments were noted with regular texture. Although MBSS does not diagnose below the level of the upper esophogeal sphincter, brief scan of esophogus suggestive of age-related dysmotilty. SLP educated pt and husband regarding importance of use of chin tuck with small sips with liquids, as well as recommendation to remain upright 90 degrees during and 30 mintues after meals, follow solids with liquid intermittently to increase motility. Given current presentation and demonstrated efficient use of chin tuck strategy with thin liquids, recommend regular texture diet, thin liquids with chin tuck, small sips and bites, slow rate, remain upright at 90 degrees during and at least 30 minutes following meals. ST will follow acutely to provide treatment including further training/reinforcement with compensatory strategies and pharyngeal strengthening exercises.  SLP Visit Diagnosis Dysphagia, pharyngeal phase (R13.13) Attention and concentration deficit following -- Frontal lobe and executive function deficit following -- Impact on safety and function Moderate aspiration risk   [facility] IP TREATMENT RECOMMENDATION 05/28/2018 Treatment Recommendations Therapy as outlined in treatment plan below   Prognosis 05/28/2018 Prognosis for Safe Diet Advancement Good Barriers to Reach Goals -- Barriers/Prognosis Comment -- [facility] IP DIET  RECOMMENDATION 05/28/2018 SLP Diet Recommendations Thin liquid;Regular solids Liquid Administration via Cup Medication Administration Whole meds with puree Compensations Slow rate;Small sips/bites;Chin tuck Postural Changes Seated upright at 90 degrees;Remain semi-upright after after feeds/meals (Comment)   [facility] IP OTHER RECOMMENDATIONS 05/28/2018 Recommended Consults -- Oral Care Recommendations Oral care BID Other Recommendations --   [facility] IP FOLLOW UP RECOMMENDATIONS 05/28/2018 Follow up Recommendations (No Data)   [facility] IP FREQUENCY AND DURATION 05/28/2018 Speech Therapy Frequency (ACUTE ONLY) min 2x/week Treatment Duration 2 weeks      [facility] IP ORAL PHASE 05/28/2018 Oral Phase WFL Oral - Pudding Teaspoon -- Oral - Pudding Cup -- Oral - Honey Teaspoon -- Oral - Honey Cup -- Oral - Nectar Teaspoon -- Oral - Nectar Cup -- Oral - Nectar Straw -- Oral - Thin Teaspoon -- Oral - Thin Cup -- Oral - Thin Straw -- Oral - Puree -- Oral - Mech Soft -- Oral - Regular -- Oral - Multi-Consistency -- Oral - Pill -- Oral Phase - Comment --  [facility] IP PHARYNGEAL PHASE 05/28/2018 Pharyngeal Phase Impaired Pharyngeal- Pudding Teaspoon -- Pharyngeal -- Pharyngeal- Pudding Cup -- Pharyngeal -- Pharyngeal- Honey Teaspoon -- Pharyngeal -- Pharyngeal- Honey Cup -- Pharyngeal -- Pharyngeal- Nectar Teaspoon -- Pharyngeal -- Pharyngeal- Nectar Cup -- Pharyngeal -- Pharyngeal- Nectar Straw -- Pharyngeal -- Pharyngeal- Thin Teaspoon -- Pharyngeal -- Pharyngeal- Thin Cup Penetration/Aspiration during swallow;Compensatory strategies attempted (with notebox) Pharyngeal Material enters airway, CONTACTS cords and not ejected out Pharyngeal- Thin Straw -- Pharyngeal -- Pharyngeal- Puree Penetration/Aspiration during swallow Pharyngeal Material enters airway, remains ABOVE vocal cords and not ejected out Pharyngeal- Mechanical Soft -- Pharyngeal -- Pharyngeal- Regular WFL Pharyngeal -- Pharyngeal- Multi-consistency -- Pharyngeal -- Pharyngeal- Pill --  Pharyngeal -- Pharyngeal Comment --  [facility] IP CERVICAL ESOPHAGEAL PHASE 05/28/2018 Cervical Esophageal Phase WFL Pudding Teaspoon -- Pudding Cup -- Honey Teaspoon -- Honey Cup -- Nectar Teaspoon -- Nectar Cup -- Nectar Straw -- Thin Teaspoon -- Thin Cup -- Thin Straw -- Puree -- Mechanical Soft -- Regular -- Multi-consistency -- Pill -- Cervical Esophageal Comment -- [place] Siren 05/28/2018, 3:06 PM [name] Litaker M.Ed Actor Pager [phone] Office [phone]              Assessment and Plan:   1.  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 She is an HD pt and reports that she has been compliant with fluid and salt restrictions as well as HD schedule. She has been seen and followed by nephrology this admission for fluid volume balance and reports symptom improvement since admission. Unfortunately, given her recent decrease in LV function and known soft BP, HF therapy aimed to improve heart function is limited.  -She appears euvolemic today -She has a follow up appointment in our office in approximately one month    2. History of CAD s/p CABG: -Patient has known CAD with remote CABG with prior intermediate Myoview and has previously opted for medical management. She is now on dialysis M-W-F. She has not been anticoagulated secondary to prior GI bleed. She has remained off of aspirin. -Denies chest pain or other anginal symptoms -Continue statin, no BB secondary to hypotension   3. Aortic stenosis: -Follow-up echocardiogram with severe bicuspid AV calcification and a mean gradient of 39mmHg  -Will need follow up echocardiogram to monitor closely  -Stable today, not an acute concern  4. ESRD: -Followed by nephrology -Currently on HD M-W-F -Symptoms markedly improved with HD during this admission   5. Prior GI bleed: -Relief from IM so she  does not take anticoagulation  6. HTN: -Soft BP, 107/56>92/48>98/59>102/64 -Pt currently on Midodrine for BP support per nephrology  -Will monitor closely  -BP limiting HF therapies at this time  -Remains asymptomatic    For questions or updates, please contact [place] Please consult [email or web address] for contact info under Cardiology/STEMI.   Signed, Kathyrn Drown NP-C [facility] Pager: [phone] 05/30/2018 2:21 PM   As above, patient seen and examined.  Briefly she is a 77 year old female with past medical history of coronary artery disease status post coronary artery bypass graft, moderate aortic stenosis, hypertension, hyperlipidemia, diabetes mellitus, end-stage renal disease now dialysis dependent for evaluation of acute on chronic systolic congestive heart failure.  Patient recently discharged following admission for congestive heart failure.  Echocardiogram May 16, 2018 showed ejection fraction 20 to 25%, mild left ventricular hypertrophy, functionally bicuspid aortic valve with moderate aortic stenosis entheses mean gradient 11 mmHg, valve area 1.01 cm), moderate mitral regurgitation, biatrial enlargement, mild to moderate RV dysfunction.  Cardiac catheterization was performed as well and medical therapy recommended.  Aortic stenosis also felt to be moderate.  Patient was discharged but states she was mildly dyspneic at home at time of discharge.  Her dyspnea progressively worsened and she was readmitted with congestive heart failure.  She has been dialyzed and her symptoms have resolved.  She did have some chest heaviness prior to dialysis but this also resolved with fluid removal.  Cardiology now asked to evaluate.  Chest x-ray at time of admission showed CHF.  Hemoglobin 8. Telemetry personally reviewed and showed sinus with PVCs. 1 acute on chronic systolic congestive heart failure-patient symptoms have resolved.  She does not make significant amounts of urine and therefore  volume will need to be managed by dialysis.  We discussed the importance of fluid restriction to 1 L daily and low-sodium diet.  2 cardiomyopathy-mixed ischemic/nonischemic; patient systolic blood pressure is in the low 90s and she is now on dialysis and requires midodrine.  Therefore we cannot add a beta-blocker or ARB/ACE inhibitor. Can follow BP as outpt and add meds in the future as tolerated.  3 coronary artery disease status post coronary artery bypass and graft-no significant chest pain.  Recent catheterization and medical therapy recommended.  Continue aspirin and statin at discharge.  4 moderate aortic stenosis-extensive evaluation recently and aortic stenosis felt not to be severe based on echocardiogram and cardiac catheterization.  She will need follow-up echoes in the future.  5 chronic anemia-likely secondary to renal insufficiency.  6 end-stage renal disease-dialysis per nephrology.  Patient can be discharged from a cardiac standpoint.  She has follow-up with [facility] in 1 month.  [facility] will sign off.   Medication Recommendations:  Continue ASA 81 mg daily and crestor at DC; continue midodrine for hypotension. Other recommendations (labs, testing, etc):  No further testing Follow up as an outpatient:  [facility] as scheduled.  Kirk Ruths, MD

## 2018-05-30 NOTE — Evaluation (Signed)
Physical Therapy Evaluation Patient Details Name: [name] MRN: [id number] DOB: [birth year] Today's Date: 05/30/2018   History of Present Illness  Pt is a 77 y.o. F with significant PMH of ESRD on hemodialysis MWF, CAD, DM2, and CHF who presents with shortness of breath. Recent hospitalization for GI bleed s/p blood transfusion and cardiac catheterization earlier this month which revealed extensive 2 vessel coronary artery disease being treated with medical management only.  Clinical Impression  Pt admitted with above diagnosis. Pt currently with functional limitations due to the deficits listed below (see PT Problem List). Prior to admission, patient was a limited household ambulator with walker and independent with ADL's. On PT evaluation, patient very motivated to participate. Ambulating 250 feet with walker and supervision. HR 80-120 bpm. Displaying decreased endurance and functional strength deficits. Pt will benefit from skilled PT to increase their independence and safety with mobility to allow discharge to the venue listed below.       Follow Up Recommendations No PT follow up;Supervision - Intermittent (politely declining HHPT)    Equipment Recommendations  None recommended by PT    Recommendations for Other Services       Precautions / Restrictions Precautions Precautions: Fall Precaution Comments: watch HR Restrictions Weight Bearing Restrictions: No      Mobility  Bed Mobility Overal bed mobility: Modified Independent             General bed mobility comments: Use of bed rail but no physical assistance  Transfers Overall transfer level: Needs assistance Equipment used: Rolling walker (2 wheeled) Transfers: Sit to/from Stand Sit to Stand: Supervision            Ambulation/Gait Ambulation/Gait assistance: Supervision Gait Distance (Feet): 250 Feet Assistive device: Rolling walker (2 wheeled) Gait Pattern/deviations: Step-through pattern;Decreased  dorsiflexion - right;Decreased dorsiflexion - left Gait velocity: decreased   General Gait Details: Slow cadence but no evidence of imbalance  Stairs            Wheelchair Mobility    Modified Rankin (Stroke Patients Only)       Balance Overall balance assessment: Mild deficits observed, not formally tested                                           Pertinent Vitals/Pain Pain Assessment: No/denies pain  Vitals: Post mobility - 143/66, HR 80-120 bpm, 98% SpO2 on RA    Home Living Family/patient expects to be discharged to:: Private residence Living Arrangements: Spouse/significant other Available Help at Discharge: Family;Available 24 hours/day Type of Home: House Home Access: Level entry     Home Layout: One level(1 step with rails to enter living room area) Home Equipment: Walker - 2 wheels;Grab bars - tub/shower;Toilet riser      Prior Function Level of Independence: Independent with assistive device(s)         Comments: Limited household ambulator with walker. Endorses history of 3 falls after she "broke her knee."     Hand Dominance        Extremity/Trunk Assessment   Upper Extremity Assessment Upper Extremity Assessment: Defer to OT evaluation    Lower Extremity Assessment Lower Extremity Assessment: Generalized weakness;RLE deficits/detail;LLE deficits/detail RLE Sensation: history of peripheral neuropathy LLE Sensation: history of peripheral neuropathy       Communication   Communication: No difficulties  Cognition Arousal/Alertness: Awake/alert Behavior During Therapy: WFL for tasks assessed/performed Overall  Cognitive Status: Within Functional Limits for tasks assessed                                 General Comments: Very pleasant and eager to walk      General Comments General comments (skin integrity, edema, etc.): Pt husband present    Exercises     Assessment/Plan    PT Assessment Patient  needs continued PT services  PT Problem List Decreased strength;Decreased activity tolerance;Decreased balance;Decreased mobility       PT Treatment Interventions DME instruction;Gait training;Stair training;Functional mobility training;Therapeutic activities;Therapeutic exercise;Balance training;Patient/family education    PT Goals (Current goals can be found in the Care Plan section)  Acute Rehab PT Goals Patient Stated Goal: "walk more." PT Goal Formulation: With patient/family Time For Goal Achievement: 06/13/18 Potential to Achieve Goals: Good    Frequency Min 3X/week   Barriers to discharge        Co-evaluation               AM-PAC PT "6 Clicks" Daily Activity  Outcome Measure Difficulty turning over in bed (including adjusting bedclothes, sheets and blankets)?: None Difficulty moving from lying on back to sitting on the side of the bed? : None Difficulty sitting down on and standing up from a chair with arms (e.g., wheelchair, bedside commode, etc,.)?: A Little Help needed moving to and from a bed to chair (including a wheelchair)?: A Little Help needed walking in hospital room?: A Little Help needed climbing 3-5 steps with a railing? : A Little 6 Click Score: 20    End of Session Equipment Utilized During Treatment: Gait belt Activity Tolerance: Patient tolerated treatment well Patient left: in chair;with call bell/phone within reach;with family/visitor present Nurse Communication: Mobility status PT Visit Diagnosis: Difficulty in walking, not elsewhere classified (R26.2);Muscle weakness (generalized) (M62.81)    Time: 1610-9604 PT Time Calculation (min) (ACUTE ONLY): 25 min   Charges:   PT Evaluation $PT Eval Moderate Complexity: 1 Mod PT Treatments $Therapeutic Activity: 8-22 mins       Ellamae Sia, PT, DPT Acute Rehabilitation Services Pager [phone] Office [phone]   Willy Eddy 05/30/2018, 9:12 AM

## 2018-05-30 NOTE — Discharge Summary (Signed)
Discharge Summary  [name]:[id number] DOB: 07/13/1941  PCP: [name], MD  Admit date: 05/26/2018 Discharge date: 05/30/2018  Time spent: 32mins, more than 50% time spent on coordination of care.  Recommendations for Outpatient Follow-up:  1. F/u with PMD within a week  for hospital discharge follow up, repeat cbc/bmp at follow up 2. F/u with nephrology, continue HD MWF 3. F/u with cardiology as already scheduled 4. She declined home health PT  Discharge Diagnoses:  Active Hospital Problems   Diagnosis Date Noted  . Acute on chronic combined systolic and diastolic heart failure ([place]) 05/17/2018  . Dyspnea 05/27/2018  . Acute respiratory failure with hypoxia ([place]) 05/27/2018  . Acute respiratory distress 05/27/2018  . Acute encephalopathy 05/27/2018  . Aspiration pneumonia ([place]) 05/27/2018  . Protein-calorie malnutrition, severe 04/20/2018  . ESRD (end stage renal disease) on dialysis ([place]) 06/23/2015  . Essential hypertension 11/17/2008  . Diabetes mellitus type II, non insulin dependent ([place]) 11/12/2008  . Anemia, chronic renal failure 11/12/2008    Resolved Hospital Problems  No resolved problems to display.    Discharge Condition: stable  Diet recommendation: heart healthy/renal diet/carb modified  Filed Weights   05/29/18 1423 05/29/18 1723 05/30/18 0640  Weight: 54 kg 51 kg 52 kg    History of present illness: (per admitting MD Dr Baron Hamper) PCP: [name], MD   Patient coming from: Home  I have personally briefly reviewed patient's old medical records in [place]  Chief Complaint: Shortness of breath  HPI: [name] is a 77 y.o. female with medical history significant of end-stage renal disease on hemodialysis, coronary artery disease, type 2 diabetes, CHF presents with shortness of breath.  Patient actually underwent extra dialysis today.  He states that he removed about 2 pounds up.  She continued to have orthopnea  and shortness of breath.  Denies any fever.  She had a recent hospitalizations for a GI bleed status post blood transfusion.  Hemoglobin today is down to 7.9.  Had a cardiac catheterization earlier this month that showed extensive 2-vessel coronary artery disease.  recommendation is to continue medical management.  She did have a new drop in her EF to 20-25%.  Cardiology did not think it was ischemic in nature.  ED Course: Chest x-ray showed volume overload.  Hospital Course:  Principal Problem:   Acute on chronic combined systolic and diastolic heart failure (HCC) Active Problems:   Diabetes mellitus type II, non insulin dependent (HCC)   Anemia, chronic renal failure   Essential hypertension   ESRD (end stage renal disease) on dialysis (HCC)   Protein-calorie malnutrition, severe   Dyspnea   Acute respiratory failure with hypoxia (HCC)   Acute respiratory distress   Acute encephalopathy   Aspiration pneumonia (HCC)  Pulmonary Edema - Acute exacerbation of chronic combined systolic and diastolic heart failure EF 20-25% via cath October 2019 She received daily HD , volume status has improved,  Cardiology consulted, heart failure meds limited due to hypotension, she is to continue dialysis, she is to follow up with nephrology and cardiology  HTN:  Now hypotension on dialysis All bp meds discontinued,  She is started on midodrine for bp support  NSVT She has one episode of NSVT on 10/13, has resolved, she is to follow up with cardiology  Aortic stenosis, moderate to severe Need to report echo in the future She is to follow up with cardiology  CAD s/p CABG 2010 She is continued on statin,  bp too low to start on betablocker She is not on asa due to h/o Gi bleed required 6units of prbc transfusion in 10/2016 No chest pain, she is to follow up with cardiology   ESRD on HD Nephrology input appreciated, he received extra dose of dialysis due to volume overloaded. She is to go back  on regular dialysis schedule MWF  Anemia of chronic disease She received one prbc during dialysis this hospitalization  No evident of gi bleed this hopitalization  DM type 2 with diabetic neuropathy, diet-controlled --No need for blood sugar checks   Severe malnutrition in context of chronic illness Underweight. Body mass index is 17.43 kg/m. Nutrition input appreciated  MRSA screening +  decolonization with mupirocin ointment and chlorhexidine cloth  She has been on chronic trimethoprim 50mg  daily, follow up with pcp to decide ongoing need of this meds  Procedures:  dialysis  Consultations:  Nephrology  cardiology  Discharge Exam: BP (!) 113/56 (BP Location: Right Arm)   Pulse 72   Temp 97.6 F (36.4 C) (Oral)   Resp (!) 21   Ht 5\' 8"  (1.727 m)   Wt 52 kg   SpO2 97%   BMI 17.43 kg/m   General: frail, chronically ill, NAD Cardiovascular: RRR Respiratory: diminished at basis, no wheezing, no rales Extremity: no edema  Discharge Instructions You were cared for by a hospitalist during your hospital stay. If you have any questions about your discharge medications or the care you received while you were in the hospital after you are discharged, you can call the unit and asked to speak with the hospitalist on call if the hospitalist that took care of you is not available. Once you are discharged, your primary care physician will handle any further medical issues. Please note that NO REFILLS for any discharge medications will be authorized once you are discharged, as it is imperative that you return to your primary care physician (or establish a relationship with a primary care physician if you do not have one) for your aftercare needs so that they can reassess your need for medications and monitor your lab values.  Discharge Instructions    Diet - low sodium heart healthy   Complete by:  As directed    Renal diet, carb modified diet   Increase activity slowly    Complete by:  As directed      Allergies as of 05/30/2018      Reactions   Doxycycline Nausea And Vomiting   Caused "DEATHLY NAUSEA AND VOMITING"   Lipitor [atorvastatin] Other (See Comments)   Stomach pain   Strawberry Extract Rash      Medication List    STOP taking these medications   aspirin 81 MG tablet   carvedilol 12.5 MG tablet Commonly known as:  COREG   losartan 25 MG tablet Commonly known as:  COZAAR     TAKE these medications   ALPRAZolam 1 MG tablet Commonly known as:  XANAX Take 1 mg by mouth at bedtime.   AMITIZA 24 MCG capsule Generic drug:  lubiprostone Take 24 mcg by mouth See admin instructions. Take 24 mcg by mouth two times a day, every other day   b complex-vitamin c-folic acid 0.8 MG Tabs tablet Take 1 tablet by mouth daily.   calcitRIOL 0.5 MCG capsule Commonly known as:  ROCALTROL Take 0.5 mcg by mouth daily.   feeding supplement (NEPRO CARB STEADY) Liqd Take 237 mLs by mouth 2 (two) times daily between meals.  folic acid 1 MG tablet Commonly known as:  FOLVITE Take 1 tablet (1 mg total) by mouth daily.   midodrine 10 MG tablet Commonly known as:  PROAMATINE Take 1 tablet (10 mg total) by mouth 2 (two) times daily with a meal.   rosuvastatin 5 MG tablet Commonly known as:  CRESTOR TAKE 1 TABLET(5 MG) BY MOUTH DAILY What changed:    how much to take  how to take this  when to take this  additional instructions   sevelamer carbonate 800 MG tablet Commonly known as:  RENVELA Take 1,600 mg 3 (three) times daily with meals by mouth.   trimethoprim 100 MG tablet Commonly known as:  TRIMPEX Take 50 mg by mouth daily.   vitamin B-12 500 MCG tablet Commonly known as:  CYANOCOBALAMIN Take 1 tablet (500 mcg total) by mouth daily.      Allergies  Allergen Reactions  . Doxycycline Nausea And Vomiting    Caused "DEATHLY NAUSEA AND VOMITING"  . Lipitor [Atorvastatin] Other (See Comments)    Stomach pain  . Strawberry  Extract Rash   Follow-up Information    [name], MD Follow up.   Specialty:  Family Medicine Contact information: [address] [place] [address] [phone]        continue dialysis MWF Follow up.            The results of significant diagnostics from this hospitalization (including imaging, microbiology, ancillary and laboratory) are listed below for reference.    Significant Diagnostic Studies: Dg Chest 2 View  Result Date: 05/26/2018 CLINICAL DATA:  77 year old female with history of increasing shortness of breath. Hypoxemia. EXAM: CHEST - 2 VIEW COMPARISON:  Chest x-ray 05/15/2018. FINDINGS: Right internal jugular PermCath with tip terminating at the superior cavoatrial junction. Status post median sternotomy for CABG. Lung volumes are normal. There is cephalization of the pulmonary vasculature and slight indistinctness of the interstitial markings suggestive of mild pulmonary edema. Small bilateral pleural effusions. Mild cardiomegaly. Upper mediastinal contours are within normal limits. Aortic atherosclerosis. IMPRESSION: 1. The appearance the chest suggests congestive heart failure, as above. 2. Aortic atherosclerosis. 3. Postoperative changes and support apparatus, as above. Electronically Signed   By: Vinnie Langton M.D.   On: 05/26/2018 23:12   Dg Chest Port 1 View  Result Date: 05/29/2018 CLINICAL DATA:  Follow-up exam.  Vas-Cath present. EXAM: PORTABLE CHEST 1 VIEW COMPARISON:  May 27, 2018 FINDINGS: The Vas-Cath is stable. There is a small right pleural effusion with underlying atelectasis. Mild pulmonary venous congestion/mild edema. Stable cardiomediastinal silhouette. No other change. IMPRESSION: Cardiomegaly and pulmonary venous congestion/mild edema. Stable small right effusion. Electronically Signed   By: Dorise Bullion III M.D   On: 05/29/2018 09:17   Dg Chest Port 1 View  Result Date: 05/27/2018 CLINICAL DATA:  Status post arrest EXAM:  PORTABLE CHEST 1 VIEW COMPARISON:  07/23/2009 FINDINGS: Cardiac shadow is enlarged but stable. Postsurgical changes are again seen. Dialysis catheter is again noted and stable. Increasing right-sided pleural effusion and right basilar infiltrate is noted when compare with the prior exam. Mild vascular congestion interstitial edema is noted as well. IMPRESSION: Changes of mild CHF. Increasing infiltrate in the right base. This may be related to the given clinical history of aspiration. Electronically Signed   By: Inez Catalina M.D.   On: 05/27/2018 11:38   Dg Chest Port 1 View  Result Date: 05/15/2018 CLINICAL DATA:  Chest pain. EXAM: PORTABLE CHEST 1 VIEW COMPARISON:  Radiographs of September 26, 2016. FINDINGS: Stable cardiomegaly. Status post coronary bypass graft. Atherosclerosis of thoracic aorta. Right internal jugular dialysis catheter is noted with tip in expected position of cavoatrial junction. No pneumothorax or pleural effusion is noted. Both lungs are clear. The visualized skeletal structures are unremarkable. IMPRESSION: No acute cardiopulmonary abnormality seen. Interval placement of right internal jugular dialysis catheter with tip in expected position of cavoatrial junction. Aortic Atherosclerosis (ICD10-I70.0). Electronically Signed   By: Marijo Conception, M.D.   On: 05/15/2018 13:37   Dg Swallowing Func-speech Pathology  Result Date: 05/28/2018 Objective Swallowing Evaluation: Type of Study: MBS-Modified Barium Swallow Study  Patient Details Name: [name] MRN: [id number] Date of Birth: Jun 29, 1941 Today's Date: 05/28/2018 Time: SLP Start Time (ACUTE ONLY): 1006 -SLP Stop Time (ACUTE ONLY): 1017 SLP Time Calculation (min) (ACUTE ONLY): 11 min Past Medical History: Past Medical History: Diagnosis Date . Anemia   Pt is taking iron.  [name] Anxiety  . Arthritis  . Carotid stenosis   40-59% bilateral ICA stenosis in 2/12. . Chronic low back pain  . CKD (chronic kidney disease)   Dr. Audie Clear at  [facility] . Coronary artery disease   Pt [facility] with NSTEMI and diastolic CHF exacerbation.  LHC was done  3/10 showing 99% pRCA stenosis and 80% calcified pLAD stenosis with L=>R collaterals.  Pt was referred  for CABG which was done by Dr. Prescott Gum with LIMA-LAD, SVG-RCA, SVG-OM. . Diabetes mellitus  . Diabetic neuropathy ([place])  . Diastolic CHF (HCC)   Echo (2/10) showed EF 55-65%, mild LVH, diastolic dysfunction, mild AS with mean gradient 12 mmHg, PASP 43 mmHg.  Echo (2/12): EF 55-60%, mild LVH, mild AS (mean gradient 12), PA systolic pressure 32 mmHg.    [name] GERD (gastroesophageal reflux disease)  . Heart murmur  . Hyperlipidemia  . Hypertension  . Mild aortic stenosis   mean gradient 12 mmHg in 2/12. . Myocardial infarction ([place])   "mild" . Pneumonia  . PONV (postoperative nausea and vomiting)  . Thrombocytopenia ([place])  . Unsteady gait  Past Surgical History: Past Surgical History: Procedure Laterality Date . AV FISTULA PLACEMENT Left 01/30/2015  Procedure: Creation of a Radial Cephalic AV Fistula left wrist;  Surgeon: [name], MD;  Location: [facility];  Service: Vascular;  Laterality: Left; . BACK SURGERY    multiple . BREAST SURGERY    biopsy . CARDIAC CATHETERIZATION   . CATARACT EXTRACTION W/ INTRAOCULAR LENS  IMPLANT, BILATERAL   . COLONOSCOPY Left 11/03/2016  Procedure: COLONOSCOPY;  Surgeon: [name], MD;  Location: [facility] ENDOSCOPY;  Service: Endoscopy;  Laterality: Left; . COLONOSCOPY W/ BIOPSIES AND POLYPECTOMY   . CORONARY ARTERY BYPASS GRAFT  09/2008  pt with NSTEMI and diastolic CHF exacerbation.  LHC was done  3/10 showing 99% pRCA stenosis and 80% calcified pLAD stenosis with L=>R collaterals.  Pt was referred  for CABG which was done by Dr. Prescott Gum with LIMA-LAD, SVG-RCA, SVG-OM. [name] ESOPHAGOGASTRODUODENOSCOPY N/A 11/02/2016  Procedure: ESOPHAGOGASTRODUODENOSCOPY (EGD);  Surgeon: [name], MD;  Location: [facility] ENDOSCOPY;  Service: Endoscopy;  Laterality: N/A; .  GIVENS CAPSULE STUDY N/A 11/29/2016  Procedure: GIVENS CAPSULE STUDY;  Surgeon: [name], MD;  Location: [facility];  Service: Endoscopy;  Laterality: N/A; . REVISON OF ARTERIOVENOUS FISTULA Left 07/02/2015  Procedure: REVISON OF LEFT RADIOCEPHALIC ARTERIOVENOUS FISTULA;  Surgeon: [name], MD;  Location: [place];  Service: Vascular;  Laterality: Left; . RIGHT/LEFT HEART CATH AND CORONARY/GRAFT  ANGIOGRAPHY N/A 05/18/2018  Procedure: RIGHT/LEFT HEART CATH AND CORONARY/GRAFT ANGIOGRAPHY;  Surgeon: Leonie Man, MD;  Location: [facility] CV LAB;  Service: Cardiovascular;  Laterality: N/A; HPI: BERNETHA ANSCHUTZ is a 77 y.o. female with medical history significant of end-stage renal disease on hemodialysis, coronary artery disease, type 2 diabetes, GERD, PNA, CHF presented to [facility] ED 10/12 with shortness of breath and palpations. Pt had recent admission (early October) for anemia requiring blood transfusion as well as a NSTEMI and notable echo for reduced EF to 20%. Per chart, RN noted pt coughing during meals yesterday (10/13). CXR revealed increasing infiltrate in the right luncg base suspicious for aspiration.  No data recorded Assessment / Plan / Recommendation CHL IP CLINICAL IMPRESSIONS 05/28/2018 Clinical Impression Pt presented with mild-mod pharyngeal dysphagia marked by slient penetration of thin liquids and puree.  Pharyngeal impairments were primarily due to decreased timing of airway closure further exacerbated by phaygneal weakness lead to penetration of thin to the vocal folds X1, and penetration above the vocal folds X2 with puree (one flash); all of which occurred without sensation. Use of compensatory chin tuck strategy in combination with small (volume) sips was effective to provide additional airway protection with deglutition of thin. No oral or pharyngeal impairments were noted with regular texture. Although MBSS does not diagnose below the level of the upper esophogeal sphincter, brief scan  of esophogus suggestive of age-related dysmotilty. SLP educated pt and husband regarding importance of use of chin tuck with small sips with liquids, as well as recommendation to remain upright 90 degrees during and 30 mintues after meals, follow solids with liquid intermittently to increase motility. Given current presentation and demonstrated efficient use of chin tuck strategy with thin liquids, recommend regular texture diet, thin liquids with chin tuck, small sips and bites, slow rate, remain upright at 90 degrees during and at least 30 minutes following meals. ST will follow acutely to provide treatment including further training/reinforcement with compensatory strategies and pharyngeal strengthening exercises.  SLP Visit Diagnosis Dysphagia, pharyngeal phase (R13.13) Attention and concentration deficit following -- Frontal lobe and executive function deficit following -- Impact on safety and function Moderate aspiration risk   CHL IP TREATMENT RECOMMENDATION 05/28/2018 Treatment Recommendations Therapy as outlined in treatment plan below   Prognosis 05/28/2018 Prognosis for Safe Diet Advancement Good Barriers to Reach Goals -- Barriers/Prognosis Comment -- CHL IP DIET RECOMMENDATION 05/28/2018 SLP Diet Recommendations Thin liquid;Regular solids Liquid Administration via Cup Medication Administration Whole meds with puree Compensations Slow rate;Small sips/bites;Chin tuck Postural Changes Seated upright at 90 degrees;Remain semi-upright after after feeds/meals (Comment)   CHL IP OTHER RECOMMENDATIONS 05/28/2018 Recommended Consults -- Oral Care Recommendations Oral care BID Other Recommendations --   CHL IP FOLLOW UP RECOMMENDATIONS 05/28/2018 Follow up Recommendations (No Data)   CHL IP FREQUENCY AND DURATION 05/28/2018 Speech Therapy Frequency (ACUTE ONLY) min 2x/week Treatment Duration 2 weeks      CHL IP ORAL PHASE 05/28/2018 Oral Phase WFL Oral - Pudding Teaspoon -- Oral - Pudding Cup -- Oral - Honey  Teaspoon -- Oral - Honey Cup -- Oral - Nectar Teaspoon -- Oral - Nectar Cup -- Oral - Nectar Straw -- Oral - Thin Teaspoon -- Oral - Thin Cup -- Oral - Thin Straw -- Oral - Puree -- Oral - Mech Soft -- Oral - Regular -- Oral - Multi-Consistency -- Oral - Pill -- Oral Phase - Comment --  CHL IP PHARYNGEAL PHASE 05/28/2018 Pharyngeal Phase Impaired Pharyngeal- Pudding Teaspoon -- Pharyngeal --  Pharyngeal- Pudding Cup -- Pharyngeal -- Pharyngeal- Honey Teaspoon -- Pharyngeal -- Pharyngeal- Honey Cup -- Pharyngeal -- Pharyngeal- Nectar Teaspoon -- Pharyngeal -- Pharyngeal- Nectar Cup -- Pharyngeal -- Pharyngeal- Nectar Straw -- Pharyngeal -- Pharyngeal- Thin Teaspoon -- Pharyngeal -- Pharyngeal- Thin Cup Penetration/Aspiration during swallow;Compensatory strategies attempted (with notebox) Pharyngeal Material enters airway, CONTACTS cords and not ejected out Pharyngeal- Thin Straw -- Pharyngeal -- Pharyngeal- Puree Penetration/Aspiration during swallow Pharyngeal Material enters airway, remains ABOVE vocal cords and not ejected out Pharyngeal- Mechanical Soft -- Pharyngeal -- Pharyngeal- Regular WFL Pharyngeal -- Pharyngeal- Multi-consistency -- Pharyngeal -- Pharyngeal- Pill -- Pharyngeal -- Pharyngeal Comment --  CHL IP CERVICAL ESOPHAGEAL PHASE 05/28/2018 Cervical Esophageal Phase WFL Pudding Teaspoon -- Pudding Cup -- Honey Teaspoon -- Honey Cup -- Nectar Teaspoon -- Nectar Cup -- Nectar Straw -- Thin Teaspoon -- Thin Cup -- Thin Straw -- Puree -- Mechanical Soft -- Regular -- Multi-consistency -- Pill -- Cervical Esophageal Comment -- Houston Siren 05/28/2018, 3:06 PM [name] Litaker M.Ed Actor Pager [phone] Office [phone]               Microbiology: Recent Results (from the past 240 hour(s))  MRSA PCR Screening     Status: Abnormal   Collection Time: 05/27/18  2:55 AM  Result Value Ref Range Status   MRSA by PCR POSITIVE (A) NEGATIVE Final    Comment: RESULT  CALLED TO, READ BACK BY AND VERIFIED WITH: RN, Nettie Elm [phone] @0545  THANEY   Culture, blood (routine x 2) Call MD if unable to obtain prior to antibiotics being given     Status: None (Preliminary result)   Collection Time: 05/27/18 12:40 PM  Result Value Ref Range Status   Specimen Description BLOOD BLOOD RIGHT HAND  Final   Special Requests   Final    BOTTLES DRAWN AEROBIC ONLY Blood Culture results may not be optimal due to an inadequate volume of blood received in culture bottles   Culture   Final    NO GROWTH 3 DAYS Performed at [facility], [place] [address] [place], [place] [address]    Report Status PENDING  Incomplete  Culture, blood (routine x 2) Call MD if unable to obtain prior to antibiotics being given     Status: None (Preliminary result)   Collection Time: 05/27/18 12:48 PM  Result Value Ref Range Status   Specimen Description BLOOD BLOOD RIGHT HAND  Final   Special Requests   Final    BOTTLES DRAWN AEROBIC ONLY Blood Culture adequate volume   Culture   Final    NO GROWTH 3 DAYS Performed at [facility], [address] [place], [place] [address]    Report Status PENDING  Incomplete     Labs: Basic Metabolic Panel: Recent Labs  Lab 05/26/18 2236 05/27/18 1031 05/28/18 0205 05/29/18 0235  NA 126* 125* 134* 137  K 4.6 5.0 4.0 3.2*  CL 94* 93* 100 100  CO2 24 22 26 28   GLUCOSE 156* 202* 95 130*  BUN 31* 33* 14 8  CREATININE 3.21* 3.51* 2.33* 2.14*  CALCIUM 8.5* 9.0 8.3* 8.4*  MG  --  2.2  --   --   PHOS  --  3.7  --   --    Liver Function Tests: Recent Labs  Lab 05/27/18 1031 05/29/18 0235  AST  --  18  ALT  --  16  ALKPHOS  --  47  BILITOT  --  0.4  PROT  --  5.3*  ALBUMIN 3.3* 2.5*   No results for input(s): LIPASE, AMYLASE in the last 168 hours. No results for input(s): AMMONIA in the last 168 hours. CBC: Recent Labs  Lab 05/26/18 2236 05/28/18 0205 05/29/18 0235  WBC 9.0 4.8 5.5  HGB 7.9* 7.1* 8.0*  HCT 24.4* 23.5*  25.5*  MCV 107.0* 109.3* 103.7*  PLT 121* 112* 111*   Cardiac Enzymes: No results for input(s): CKTOTAL, CKMB, CKMBINDEX, TROPONINI in the last 168 hours. BNP: BNP (last 3 results) Recent Labs    05/26/18 2237  BNP 4,003.0*    ProBNP (last 3 results) No results for input(s): PROBNP in the last 8760 hours.  CBG: Recent Labs  Lab 05/29/18 1210 05/29/18 1823 05/29/18 2152 05/30/18 0752 05/30/18 1251  GLUCAP 207* 97 191* 124* 129*       Signed:  Florencia Reasons MD, PhD  [facility] 05/30/2018, 4:32 PM

## 2018-05-30 NOTE — Progress Notes (Signed)
Pharmacist Heart Failure Core Measure Documentation  Assessment: [name] has an EF documented as 20-25% on 05/16/18 by Echo.  Rationale: Heart failure patients with left ventricular systolic dysfunction (LVSD) and an EF < 40% should be prescribed an angiotensin converting enzyme inhibitor (ACEI) or angiotensin receptor blocker (ARB) at discharge unless a contraindication is documented in the medical record.  This patient is not currently on an ACEI or ARB for HF.  This note is being placed in the record in order to provide documentation that a contraindication to the use of these agents is present for this encounter.  ACE Inhibitor or Angiotensin Receptor Blocker is contraindicated (specify all that apply)  []   ACEI allergy AND ARB allergy []   Angioedema []   Moderate or severe aortic stenosis []   Hyperkalemia [x]   Hypotension []   Renal artery stenosis [x]   Worsening renal function, preexisting renal disease or dysfunction  Hildred Laser, PharmD Clinical Pharmacist Please check Amion for pharmacy contact number

## 2018-05-30 NOTE — Progress Notes (Signed)
Pt given discharge instructions with understanding. Pt has no questions at this time. Iv and monitor d/c pt awaiting son to arrive to take hoome.

## 2018-05-30 NOTE — Progress Notes (Signed)
    [facility]       Subjective: no new c/o, walked w PT, may be going home today   Objective       Vitals:   05/27/18 0045 05/27/18 0100 05/27/18 0145 05/27/18 0236  BP: (!) 106/53 (!) 109/57 (!) 96/52 119/63  Pulse: 69 72 69 79  Resp: (!) 25 (!) 27 15 18   Temp:    97.7 F (36.5 C)  TempSrc:    Oral  SpO2: 95% 97% 95% 98%  Weight:    55.6 kg  Height:       Physical Exam General: Frail elderly female, stable, calm chron ill appearing and very frail pleaseant Neck: no jvd Heart: RRR 2/6 SEM Lungs: clear throughout Abdomen: soft NT/ND Extremities: no sig LE edema Dialysis Access: RIJ TDC; L A AVF no bruit   Home meds: - alprazolam 1 mg hs - aspirin 81/ rosuvastatin 5 hs - losartan 25 qd/ carvediolol 12.5 TTSS - vitamins/ prns/ supplements - sevelamer carb 1.6gm tid  - trimethoprim 50 qd   Dialysis:[facility] MWF 4h  49kg  2/2.25  Hep 1000   LFA AVF (no bruit)/ R IJ TDC Hectorol 2 mcg  Mircera 100 mcg (last 10/07   Assessment/Plan: 1. SOB/ pulm edema - resolved w/ HD 2. ESRD - sp HD x 3, ok for dc today, next HD Friday at OP unit  3. HD access - AVF unable to be salvaged, using R IJ TDC. To f/u [facility] for new access as outpatient 3. Symptomatic Anemia - Hgb stable s/p 2U prbcs on 10/1  +FOBT 4. HTN/ CM EF 20-25% - BP's too low for ARB/ BB, have dc'd. New midodrine 10 bid.  5. MBD - Ca/Phos ok. Continue VDRA. No binders here.  6. CAD/ s/p 3V CABG 2010 7. AS - not severe by heart cath 8. Gen weakness - multifactorial, improving.   Kelly Splinter MD [employer] pager [phone]   05/27/2018, 9:34 AM

## 2018-05-31 LAB — GLUCOSE, CAPILLARY: GLUCOSE-CAPILLARY: 131 mg/dL — AB (ref 70–99)

## 2018-06-01 LAB — CULTURE, BLOOD (ROUTINE X 2)
Culture: NO GROWTH
Culture: NO GROWTH
Special Requests: ADEQUATE

## 2018-06-05 ENCOUNTER — Encounter: Payer: Self-pay | Admitting: Nurse Practitioner

## 2018-06-11 DIAGNOSIS — Z992 Dependence on renal dialysis: Secondary | ICD-10-CM | POA: Diagnosis not present

## 2018-06-11 DIAGNOSIS — N186 End stage renal disease: Secondary | ICD-10-CM | POA: Diagnosis not present

## 2018-06-11 DIAGNOSIS — T8249XA Other complication of vascular dialysis catheter, initial encounter: Secondary | ICD-10-CM | POA: Diagnosis not present

## 2018-06-13 ENCOUNTER — Other Ambulatory Visit: Payer: Medicare HMO

## 2018-06-13 ENCOUNTER — Ambulatory Visit: Payer: Medicare HMO

## 2018-06-13 DIAGNOSIS — D689 Coagulation defect, unspecified: Secondary | ICD-10-CM | POA: Diagnosis not present

## 2018-06-13 DIAGNOSIS — E8770 Fluid overload, unspecified: Secondary | ICD-10-CM | POA: Diagnosis not present

## 2018-06-13 DIAGNOSIS — N186 End stage renal disease: Secondary | ICD-10-CM | POA: Diagnosis not present

## 2018-06-13 DIAGNOSIS — D649 Anemia, unspecified: Secondary | ICD-10-CM | POA: Diagnosis not present

## 2018-06-13 DIAGNOSIS — N2581 Secondary hyperparathyroidism of renal origin: Secondary | ICD-10-CM | POA: Diagnosis not present

## 2018-06-14 DIAGNOSIS — I129 Hypertensive chronic kidney disease with stage 1 through stage 4 chronic kidney disease, or unspecified chronic kidney disease: Secondary | ICD-10-CM | POA: Diagnosis not present

## 2018-06-14 DIAGNOSIS — N186 End stage renal disease: Secondary | ICD-10-CM | POA: Diagnosis not present

## 2018-06-14 DIAGNOSIS — Z992 Dependence on renal dialysis: Secondary | ICD-10-CM | POA: Diagnosis not present

## 2018-06-15 DIAGNOSIS — D649 Anemia, unspecified: Secondary | ICD-10-CM | POA: Diagnosis not present

## 2018-06-15 DIAGNOSIS — N2581 Secondary hyperparathyroidism of renal origin: Secondary | ICD-10-CM | POA: Diagnosis not present

## 2018-06-15 DIAGNOSIS — N186 End stage renal disease: Secondary | ICD-10-CM | POA: Diagnosis not present

## 2018-06-15 DIAGNOSIS — D689 Coagulation defect, unspecified: Secondary | ICD-10-CM | POA: Diagnosis not present

## 2018-06-26 ENCOUNTER — Ambulatory Visit: Payer: Medicare HMO | Admitting: Nurse Practitioner

## 2018-06-26 ENCOUNTER — Encounter: Payer: Self-pay | Admitting: Nurse Practitioner

## 2018-06-26 VITALS — BP 120/70 | HR 75 | Ht 68.0 in | Wt 108.1 lb

## 2018-06-26 DIAGNOSIS — E785 Hyperlipidemia, unspecified: Secondary | ICD-10-CM | POA: Diagnosis not present

## 2018-06-26 DIAGNOSIS — N186 End stage renal disease: Secondary | ICD-10-CM | POA: Diagnosis not present

## 2018-06-26 DIAGNOSIS — I350 Nonrheumatic aortic (valve) stenosis: Secondary | ICD-10-CM

## 2018-06-26 DIAGNOSIS — E7800 Pure hypercholesterolemia, unspecified: Secondary | ICD-10-CM

## 2018-06-26 DIAGNOSIS — I259 Chronic ischemic heart disease, unspecified: Secondary | ICD-10-CM | POA: Diagnosis not present

## 2018-06-26 LAB — LIPID PANEL
Chol/HDL Ratio: 1.9 ratio (ref 0.0–4.4)
Cholesterol, Total: 110 mg/dL (ref 100–199)
HDL: 59 mg/dL (ref >39)
LDL Calculated: 26 mg/dL (ref 0–99)
Triglycerides: 125 mg/dL (ref 0–149)
VLDL Cholesterol Cal: 25 mg/dL (ref 5–40)

## 2018-06-26 LAB — HEPATIC FUNCTION PANEL
ALT: 14 IU/L (ref 0–32)
AST: 20 IU/L (ref 0–40)
Albumin: 4.4 g/dL (ref 3.5–4.8)
Alkaline Phosphatase: 76 IU/L (ref 39–117)
Bilirubin Total: 0.4 mg/dL (ref 0.0–1.2)
Bilirubin, Direct: 0.13 mg/dL (ref 0.00–0.40)
Total Protein: 6.8 g/dL (ref 6.0–8.5)

## 2018-06-26 LAB — CBC
Hematocrit: 34.6 % (ref 34.0–46.6)
Hemoglobin: 11.3 g/dL (ref 11.1–15.9)
MCH: 33.9 pg — ABNORMAL HIGH (ref 26.6–33.0)
MCHC: 32.7 g/dL (ref 31.5–35.7)
MCV: 104 fL — ABNORMAL HIGH (ref 79–97)
Platelets: 103 10*3/uL — ABNORMAL LOW (ref 150–450)
RBC: 3.33 x10E6/uL — ABNORMAL LOW (ref 3.77–5.28)
RDW: 17.6 % — ABNORMAL HIGH (ref 12.3–15.4)
WBC: 6.1 10*3/uL (ref 3.4–10.8)

## 2018-06-26 LAB — BASIC METABOLIC PANEL
BUN/Creatinine Ratio: 8 — ABNORMAL LOW (ref 12–28)
BUN: 31 mg/dL — ABNORMAL HIGH (ref 8–27)
CO2: 23 mmol/L (ref 20–29)
Calcium: 9.3 mg/dL (ref 8.7–10.3)
Chloride: 97 mmol/L (ref 96–106)
Creatinine, Ser: 3.99 mg/dL — ABNORMAL HIGH (ref 0.57–1.00)
GFR calc Af Amer: 12 mL/min/{1.73_m2} — ABNORMAL LOW (ref >59)
GFR calc non Af Amer: 10 mL/min/{1.73_m2} — ABNORMAL LOW (ref >59)
Glucose: 120 mg/dL — ABNORMAL HIGH (ref 65–99)
Potassium: 4.5 mmol/L (ref 3.5–5.2)
Sodium: 137 mmol/L (ref 134–144)

## 2018-06-26 MED ORDER — ROSUVASTATIN CALCIUM 5 MG PO TABS: 5.0000 mg | ORAL_TABLET | Freq: Every day | ORAL | 3 refills | Status: DC

## 2018-06-26 NOTE — Progress Notes (Signed)
CARDIOLOGY OFFICE NOTE  Date:  06/26/2018    [name] Date of Birth: Jun 10, 1941 Medical Record #[id number]  PCP:  [name], MD  Cardiologist:  [name]   Chief Complaint  Patient presents with  . Coronary Artery Disease  . Congestive Heart Failure    Post hospital visit     History of Present Illness: MIEKE BRINLEY is a 77 y.o. female who presents today for a post hospital visit. Former patient of Dr. Claris Gladden - primarily follows with me.   She has a history of CAD s/p CABG in 7408, diastolic CHF, chronic anemia, HTN, DM 2, HLD, ESRD on HD M-W-F schedule, chronic back pain, anxiety and known valvular heart disease.  She had an abnormal Myoview back in 2017 - opted for medical management because she did not wish to have cath that would lead to dialysis. She has had prior stroke back in 2018 and then massive GI bleed - required 6 units of PRBCs. Aspirin was stopped.   I saw her at the beginning of October - she was admitted from the office with chest pain/shortness of breath/failure to thrive as well as new LBBB. She had started dialysis the month before. Given her presenting symptoms, she underwent a full cardiac work-up including cardiac catheterization which showed extensive two-vessel coronary artery disease with recommendations for continued medical management and secondary prevention. She had an echocardiogram which showed a new decrease in her LV function with an LVEF noted to be 20 to 25%.  She was noted to again be quite anemic - required transfusion. She did not wish to have GI work up. Cardiology was consulted during that time and did not feel that this was ischemic in nature.   She presented back to the hospital on 05/26/2017 with worsening shortness of breath however denies chest pain, recent illness, fever, chills, cough, LE swelling, nausea or vomiting, dizziness, presyncope or syncopal episodes. She reports compliance with medications, dialysis and fluid and  salt restrictions. In the ED, CXR showed fluid volume overload.  On presentation, BNP was noted to be markedly elevated at greater than 4000.  She was admitted to hospitalist service given several comorbid conditions for fluid volume management and dialysis treatment.  Cardiology was once again consulted for evaluation of fluid management.   Comes in today. Here withher husband.She says she is feeling much better. She is not having chest pain. She is not short of breath. Her access catheter is in the right upper chest - she says this will be moved to her right arm - she is waiting on referral to [facility] per her report. She is basically off all cardiac medicines. Not on aspirin due to recurrent need for transfusion. Now on Midodrine. Sounds like she is tolerating her dialysis fairly well. She says someone spoke to her about Hospice but she is not ready to "leave this world yet". She needs her statin refilled.   Past Medical History:  Diagnosis Date  . Anemia    Pt is taking iron.   [name] Anxiety   . Arthritis   . Carotid stenosis    40-59% bilateral ICA stenosis in 2/12.  . Chronic low back pain   . CKD (chronic kidney disease)    Dr. Audie Clear at [facility]  . Coronary artery disease    Pt presented 2/10 to [facility] with NSTEMI and diastolic CHF exacerbation.  LHC was done  3/10 showing 99% pRCA stenosis and 80% calcified pLAD stenosis  with L=>R collaterals.  Pt was referred  for CABG which was done by Dr. Prescott Gum with LIMA-LAD, SVG-RCA, SVG-OM.  . Diabetes mellitus   . Diabetic neuropathy ([place])   . Diastolic CHF (HCC)    Echo (2/10) showed EF 55-65%, mild LVH, diastolic dysfunction, mild AS with mean gradient 12 mmHg, PASP 43 mmHg.  Echo (2/12): EF 55-60%, mild LVH, mild AS (mean gradient 12), PA systolic pressure 32 mmHg.     [name] GERD (gastroesophageal reflux disease)   . Heart murmur   . Hyperlipidemia   . Hypertension   . Mild aortic stenosis    mean gradient 12 mmHg in 2/12.  .  Myocardial infarction ([place])    "mild"  . Pneumonia   . PONV (postoperative nausea and vomiting)   . Thrombocytopenia ([place])   . Unsteady gait     Past Surgical History:  Procedure Laterality Date  . AV FISTULA PLACEMENT Left 01/30/2015   Procedure: Creation of a Radial Cephalic AV Fistula left wrist;  Surgeon: [name], MD;  Location: [place];  Service: Vascular;  Laterality: Left;  . BACK SURGERY     multiple  . BREAST SURGERY     biopsy  . CARDIAC CATHETERIZATION    . CATARACT EXTRACTION W/ INTRAOCULAR LENS  IMPLANT, BILATERAL    . COLONOSCOPY Left 11/03/2016   Procedure: COLONOSCOPY;  Surgeon: [name], MD;  Location: [facility] ENDOSCOPY;  Service: Endoscopy;  Laterality: Left;  . COLONOSCOPY W/ BIOPSIES AND POLYPECTOMY    . CORONARY ARTERY BYPASS GRAFT  09/2008   pt with NSTEMI and diastolic CHF exacerbation.  LHC was done  3/10 showing 99% pRCA stenosis and 80% calcified pLAD stenosis with L=>R collaterals.  Pt was referred  for CABG which was done by Dr. Prescott Gum with LIMA-LAD, SVG-RCA, SVG-OM.  [name] ESOPHAGOGASTRODUODENOSCOPY N/A 11/02/2016   Procedure: ESOPHAGOGASTRODUODENOSCOPY (EGD);  Surgeon: [name], MD;  Location[facility] ENDOSCOPY;  Service: Endoscopy;  Laterality: N/A;  . GIVENS CAPSULE STUDY N/A 11/29/2016   Procedure: GIVENS CAPSULE STUDY;  Surgeon: [name], MD;  Location: [place];  Service: Endoscopy;  Laterality: N/A;  . REVISON OF ARTERIOVENOUS FISTULA Left 07/02/2015   Procedure: REVISON OF LEFT RADIOCEPHALIC ARTERIOVENOUS FISTULA;  Surgeon: [name], MD;  Location: [place];  Service: Vascular;  Laterality: Left;  . RIGHT/LEFT HEART CATH AND CORONARY/GRAFT ANGIOGRAPHY N/A 05/18/2018   Procedure: RIGHT/LEFT HEART CATH AND CORONARY/GRAFT ANGIOGRAPHY;  Surgeon: Leonie Man, MD;  Location: [place] CV LAB;  Service: Cardiovascular;  Laterality: N/A;     Medications: Current Meds  Medication Sig  . ALPRAZolam (XANAX) 1 MG tablet Take 1 mg by mouth at  bedtime.   . AMITIZA 24 MCG capsule Take 24 mcg by mouth See admin instructions. Take 24 mcg by mouth two times a day, every other day  . b complex-vitamin c-folic acid (NEPHRO-VITE) 0.8 MG TABS tablet Take 1 tablet by mouth daily.  . calcitRIOL (ROCALTROL) 0.5 MCG capsule Take 0.5 mcg by mouth daily.  . folic acid (FOLVITE) 1 MG tablet Take 1 tablet (1 mg total) by mouth daily.  . midodrine (PROAMATINE) 10 MG tablet Take 1 tablet (10 mg total) by mouth 2 (two) times daily with a meal.  . Nutritional Supplements (FEEDING SUPPLEMENT, NEPRO CARB STEADY,) LIQD Take 237 mLs by mouth 2 (two) times daily between meals.  . rosuvastatin (CRESTOR) 5 MG tablet Take 1 tablet (5 mg total) by mouth at bedtime.  . sevelamer carbonate (RENVELA) 800  MG tablet Take 1,600 mg 3 (three) times daily with meals by mouth.   . trimethoprim (TRIMPEX) 100 MG tablet Take 50 mg by mouth daily.   . vitamin B-12 (CYANOCOBALAMIN) 500 MCG tablet Take 1 tablet (500 mcg total) by mouth daily.  . [DISCONTINUED] rosuvastatin (CRESTOR) 5 MG tablet TAKE 1 TABLET(5 MG) BY MOUTH DAILY (Patient taking differently: Take 5 mg by mouth at bedtime. )     Allergies: Allergies  Allergen Reactions  . Doxycycline Nausea And Vomiting    Caused "DEATHLY NAUSEA AND VOMITING"  . Lipitor [Atorvastatin] Other (See Comments)    Stomach pain  . Strawberry Extract Rash    Social History: The patient  reports that she has quit smoking. Her smoking use included cigarettes. She has never used smokeless tobacco. She reports that she drinks alcohol. She reports that she does not use drugs.   Family History: The patient's She was adopted. Family history is unknown by patient.   Review of Systems: Please see the history of present illness.   Otherwise, the review of systems is positive for none.   All other systems are reviewed and negative.   Physical Exam: VS:  BP 120/70 (BP Location: Right Arm, Patient Position: Sitting, Cuff Size: Normal)    Pulse 75   Ht 5\' 8"  (1.727 m)   Wt 108 lb 1.9 oz (49 kg)   SpO2 99% Comment: at rest  BMI 16.44 kg/m  .  BMI Body mass index is 16.44 kg/m.  Wt Readings from Last 3 Encounters:  06/26/18 108 lb 1.9 oz (49 kg)  05/30/18 114 lb 10.2 oz (52 kg)  05/19/18 109 lb 8 oz (49.7 kg)    General: Pleasant. She looks chronically ill but better than last visit. She is alert and in no acute distress.  She is quite thin.  HEENT: Normal.  Neck: Supple, no JVD, carotid bruits, or masses noted.  Cardiac: Regular rate and rhythm. Soft outflow murmur noted. No edema.  Respiratory:  Lungs are clear to auscultation bilaterally with normal work of breathing.  GI: Soft and nontender.  MS: No deformity or atrophy. Gait and ROM intact. Using a walker.   Skin: Warm and dry. Color is normal.  Neuro:  Strength and sensation are intact and no gross focal deficits noted.  Psych: Alert, appropriate and with normal affect.   LABORATORY DATA:  EKG:  EKG is not ordered today.  Lab Results  Component Value Date   WBC 5.5 05/29/2018   HGB 8.0 (L) 05/29/2018   HCT 25.5 (L) 05/29/2018   PLT 111 (L) 05/29/2018   GLUCOSE 130 (H) 05/29/2018   CHOL 81 (L) 12/19/2017   TRIG 68 12/19/2017   HDL 56 12/19/2017   LDLDIRECT 34.4 11/17/2008   LDLCALC 11 12/19/2017   ALT 16 05/29/2018   AST 18 05/29/2018   NA 137 05/29/2018   K 3.2 (L) 05/29/2018   CL 100 05/29/2018   CREATININE 2.14 (H) 05/29/2018   BUN 8 05/29/2018   CO2 28 05/29/2018   TSH 2.185 01/17/2014   INR 1.07 04/18/2018   HGBA1C 6.0 (H) 05/15/2018     BNP (last 3 results) Recent Labs    05/26/18 2237  BNP 4,003.0*    ProBNP (last 3 results) No results for input(s): PROBNP in the last 8760 hours.   Other Studies Reviewed Today:  RIGHT/LEFT HEART CATH AND CORONARY/GRAFT ANGIOGRAPHY 05/18/2018  Conclusion     LV end diastolic pressure is moderately elevated.  There is  mild-moderate aortic valve stenosis. --Does not meet criteria for  low output aortic stenosis.  Prox RCA lesion is 95% stenosed. Prox RCA to Mid RCA lesion is 95% stenosed. Mid RCA lesion is 100% stenosed -CTO at site of initial subtotal occlusion  SVG-RCA graft was visualized by angiography. Origin lesion is 100% stenosed.  Minimal to mild disease in the circumflex which essentially is a large OM branch.  SVG-CxOM graft was visualized by angiography. Origin lesion is 100% stenosed.  Prox LAD lesion is 99% stenosed with 70% stenosed side branch in Ost 1st Diag. -This represents the only significant new lesion from pre-CABG.  Prox LAD to Mid LAD lesion is 100% stenosed -CTO at original 80% stenosis site.  LIMA-mLAD graft was visualized by angiography and is large. Origin lesion is 40% stenosed.  Heavily calcified left subclavian artery at the takeoff of LIMA and vertebral artery.   Severe two-vessel native CAD with diffuse 95% followed by 100% CTO of the mid RCA (fills via left to right collaterals), and 99% proximal LAD prior to D1 followed by 100% CTO in the mid. Widely patent native Circumflex-OM with mild diffuse disease. Patent LIMA-LAD with heavy calcification in the subclavian artery near the takeoff as well as a proximal calcified 40% stenosis. Chronic total occlusion of SVG-RCA and SVG-OM. --Extent of CAD is essentially not much different than prior to CABG with exception of now CTO of the RCA is post a subtotal occlusion.  The only new lesion that was not present on pre-CABG films is a 99% proximal LAD lesion prior to D1. -->  Reduction in LVEF is out of proportion with extent of CAD change.  Relatively normal PA pressures with PCWP of 18 and LVEDP of 22. Probably moderate aortic stenosis with minimal gradient but low output by thermodilution. Conflicting Cardiac Output/Index by Fick and thermodilution.  Thermodilution estimates 3.57/2.19, Fick estimates 5.09/3.13.  Recommendation: Continued medical management of cardiomyopathy and volume  control with dialysis. Does not appear to have low output aortic stenosis given the minimal gradient in the Cath Lab. Consider nonischemic etiology for reduced EF.  Recommend Aspirin 81mg  daily for severe CAD.Glenetta Hew, MD   2D Echo 05/16/18 Study Conclusions  - Left ventricle: The cavity size was normal. Wall thickness was increased in a pattern of mild LVH. Systolic function was severely reduced. The estimated ejection fraction was in the range of 20% to 25%. Diffuse hypokinesis with relative preservation of the basal anterior wall and the basal to mid lateral wall. Septal-lateral dyssynychrony. Doppler parameters are consistent with restrictive physiology, indicative of decreased left ventricular diastolic compliance and/or increased left atrial pressure. E/medial e&' >15 suggests LV end diastolic pressure at least 20 mmHg. 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 There was trivial regurgitation. Overall, suspect moderate, low gradient aortic stenosis. Visually does not appear severe (not marked turbulence). Mean gradient (S): 11 mm Hg. Valve area (VTI): 1.01 cm^2. - Mitral valve: Mildly calcified annulus. There was moderate regurgitation. - Left atrium: The atrium was moderately dilated. - Right ventricle: The cavity size was normal. Systolic function was mildly to moderately reduced. - Right atrium: The atrium was moderately dilated. - Tricuspid valve: There was moderate regurgitation. Peak RV-RA gradient (S): 39 mm Hg. - Pulmonary arteries: PA peak pressure: 47 mm Hg (S). - Systemic veins: IVC measured 2.0 cm with < 50% respirophasic variation, suggesting RA pressure 8 mmHg.  Impressions:  - Normal LV  size with mild LV hypertrophy. EF 20-25% with wall motion abnormalities as noted above. Restrictive diastolic function. Normal RV size  with mild to moderate systolic dysfunction. Moderate MR, moderate TR. Mild pulmonary hypertension. The aortic valve appeared functionally bicuspid (left and right cusps fused). There was severe calcification. Mean gradient 11 with AVA 1.01 cm^2 by continuity equation. Visually, think low gradient moderate aortic stenosis is most likely.  EchoStudy Conclusions2/2018  - Left ventricle: The cavity size was normal. There was moderate focal basal and mild concentric hypertrophy. Systolic function was normal. The estimated ejection fraction was in the range of 55% to 60%. Wall motion was normal; there were no regional wall motion abnormalities. Features are consistent with a pseudonormal left ventricular filling pattern, with concomitant abnormal relaxation and increased filling pressure (grade 2 diastolic dysfunction). - Aortic valve: The AV appears mildly to moderately stenotic by doppler with a mean AV gradient of 7mmHg. The calculated AVA is 0.9cm2 and likely underestimates with AVA due to inaccurate measurment of the LVOT. Visually there appears to be mild to moderate AS. Valve area (VTI): 0.9 cm^2. Valve area (Vmax): 0.89 cm^2. Valve area (Vmean): 0.86 cm^2. - Mitral valve: There was trivial regurgitation. - Right ventricle: Systolic function was mildly reduced. - Pulmonic valve: There was mild regurgitation. - Pulmonary arteries: PA peak pressure: 33 mm Hg (S).   Myoview Study Highlights 02/2016    Nuclear stress EF: 54%. There is mid inferior wall hypokinesis  There was no ST segment deviation noted during stress.  Defect 1: There is a medium defect of moderate severity present in the basal inferior and mid inferior location.  The mid inferior wall perfusion defect is reversible consistent with ischemia. The basal inferior wall defect is mostly fixed consistent with infarct.  This is an intermediate risk study. Consider occlusion  of SVG to RCA graft.    Carotid Doppler Summary2/2018: Findings consistent with a high end 1- 39 percent stenosis involving the right internal carotid artery and the left internal carotid artery. Can not rule out higher grade stenosis due to severe amount of shadowing plaque in bilateral carotid arteries and difficulty visualizing vessels. Severe amount of plaque are subjectively suggestive of a 40-59% percent stenosis bilaterally. The right vertebral demonstrates atypical flow, left vertebral is patent and antegrade.   Assessment/Plan:  1.  CAD - recent cath - noted as above - to manage medically. She is basically on no cardiac medicines other than her statin due to BP issues with dialysis. Fortunately, not having chest pain at this time. She will not be able to be on guideline therapy due to low BP and having to now be on Midodrine to tolerate her dialysis. Overall prognosis is tenuous at best.   2. 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 She is on chronic dialysis - tolerating ok.   3. Bicuspid AV with moderate AS noted on recent R heart cath - no cardinal symptoms - will follow.   4. ESRD: followed by nephrology. Tolerating ok. Her access site is probably going to have to be moved - she will be at increased risk for any procedure going forward.   5. Prior GI bleed: she is not on anticoagulation due to prior bleeding and continued need for transfusion. She has not wished to have GI work up.   6. HTN - no longer an issue - now on Midodrine - unfortunately, this  does limit having her on guideline therapy.   Current medicines are reviewed with the patient today.  The patient does not have concerns regarding medicines other than what has been noted above.  The following changes have been made:  See above.  Labs/ tests ordered today include:    Orders Placed This  Encounter  Procedures  . Basic metabolic panel  . CBC  . Lipid panel  . Hepatic function panel     Disposition:   FU with me in 4 months.   Patient is agreeable to this plan and will call if any problems develop in the interim.   SignedTruitt Merle, NP  06/26/2018 9:39 AM  [place] [address] [place], [place]  [address] Phone: [phone] Fax: [phone]

## 2018-06-26 NOTE — Patient Instructions (Addendum)
We will be checking the following labs today - BMET, CBC, HPF and lipids  If you have labs (blood work) drawn today and your tests are completely normal, you will receive your results only by: [name] MyChart Message (if you have MyChart) OR . A paper copy in the mail If you have any lab test that is abnormal or we need to change your treatment, we will call you to review the results.   Medication Instructions:    Continue with your current medicines.   I sent in your refill for Crestor today   If you need a refill on your cardiac medications before your next appointment, please call your pharmacy.     Testing/Procedures To Be Arranged:  N/A  Follow-Up:   See me in 4 months    At [facility], you and your health needs are our priority.  As part of our continuing mission to provide you with exceptional heart care, we have created designated Provider Care Teams.  These Care Teams include your primary Cardiologist (physician) and Advanced Practice Providers (APPs -  Physician Assistants and Nurse Practitioners) who all work together to provide you with the care you need, when you need it.  Special Instructions:  . Ask at dialysis tomorrow about your vascular referral  Call the [place] office at [phone] if you have any questions, problems or concerns.

## 2018-06-28 DIAGNOSIS — D631 Anemia in chronic kidney disease: Secondary | ICD-10-CM | POA: Diagnosis not present

## 2018-06-28 DIAGNOSIS — I2510 Atherosclerotic heart disease of native coronary artery without angina pectoris: Secondary | ICD-10-CM | POA: Diagnosis not present

## 2018-06-28 DIAGNOSIS — I5043 Acute on chronic combined systolic (congestive) and diastolic (congestive) heart failure: Secondary | ICD-10-CM | POA: Diagnosis not present

## 2018-06-28 DIAGNOSIS — I770 Arteriovenous fistula, acquired: Secondary | ICD-10-CM | POA: Diagnosis not present

## 2018-06-28 DIAGNOSIS — E7800 Pure hypercholesterolemia, unspecified: Secondary | ICD-10-CM | POA: Diagnosis not present

## 2018-06-28 DIAGNOSIS — R69 Illness, unspecified: Secondary | ICD-10-CM | POA: Diagnosis not present

## 2018-06-28 DIAGNOSIS — N185 Chronic kidney disease, stage 5: Secondary | ICD-10-CM | POA: Diagnosis not present

## 2018-07-02 DIAGNOSIS — D689 Coagulation defect, unspecified: Secondary | ICD-10-CM | POA: Diagnosis not present

## 2018-07-02 DIAGNOSIS — N2581 Secondary hyperparathyroidism of renal origin: Secondary | ICD-10-CM | POA: Diagnosis not present

## 2018-07-02 DIAGNOSIS — N186 End stage renal disease: Secondary | ICD-10-CM | POA: Diagnosis not present

## 2018-07-02 DIAGNOSIS — D649 Anemia, unspecified: Secondary | ICD-10-CM | POA: Diagnosis not present

## 2018-07-04 ENCOUNTER — Other Ambulatory Visit: Payer: Medicare HMO

## 2018-07-04 ENCOUNTER — Ambulatory Visit: Payer: Medicare HMO

## 2018-07-14 DIAGNOSIS — N186 End stage renal disease: Secondary | ICD-10-CM | POA: Diagnosis not present

## 2018-07-14 DIAGNOSIS — I129 Hypertensive chronic kidney disease with stage 1 through stage 4 chronic kidney disease, or unspecified chronic kidney disease: Secondary | ICD-10-CM | POA: Diagnosis not present

## 2018-07-14 DIAGNOSIS — Z992 Dependence on renal dialysis: Secondary | ICD-10-CM | POA: Diagnosis not present

## 2018-07-16 ENCOUNTER — Other Ambulatory Visit: Payer: Self-pay

## 2018-07-16 DIAGNOSIS — Z23 Encounter for immunization: Secondary | ICD-10-CM | POA: Diagnosis not present

## 2018-07-16 DIAGNOSIS — N186 End stage renal disease: Secondary | ICD-10-CM | POA: Diagnosis not present

## 2018-07-16 DIAGNOSIS — N185 Chronic kidney disease, stage 5: Secondary | ICD-10-CM

## 2018-07-16 DIAGNOSIS — N2581 Secondary hyperparathyroidism of renal origin: Secondary | ICD-10-CM | POA: Diagnosis not present

## 2018-07-16 DIAGNOSIS — D689 Coagulation defect, unspecified: Secondary | ICD-10-CM | POA: Diagnosis not present

## 2018-07-25 ENCOUNTER — Other Ambulatory Visit: Payer: Medicare HMO

## 2018-07-25 ENCOUNTER — Ambulatory Visit: Payer: Medicare HMO

## 2018-08-03 DIAGNOSIS — D689 Coagulation defect, unspecified: Secondary | ICD-10-CM | POA: Diagnosis not present

## 2018-08-03 DIAGNOSIS — Z23 Encounter for immunization: Secondary | ICD-10-CM | POA: Diagnosis not present

## 2018-08-03 DIAGNOSIS — N186 End stage renal disease: Secondary | ICD-10-CM | POA: Diagnosis not present

## 2018-08-03 DIAGNOSIS — N2581 Secondary hyperparathyroidism of renal origin: Secondary | ICD-10-CM | POA: Diagnosis not present

## 2018-08-14 DIAGNOSIS — I129 Hypertensive chronic kidney disease with stage 1 through stage 4 chronic kidney disease, or unspecified chronic kidney disease: Secondary | ICD-10-CM | POA: Diagnosis not present

## 2018-08-14 DIAGNOSIS — Z992 Dependence on renal dialysis: Secondary | ICD-10-CM | POA: Diagnosis not present

## 2018-08-14 DIAGNOSIS — N186 End stage renal disease: Secondary | ICD-10-CM | POA: Diagnosis not present

## 2018-08-16 ENCOUNTER — Inpatient Hospital Stay: Payer: Medicare HMO | Admitting: Oncology

## 2018-08-16 ENCOUNTER — Inpatient Hospital Stay: Payer: Medicare HMO

## 2018-08-17 DIAGNOSIS — E611 Iron deficiency: Secondary | ICD-10-CM | POA: Diagnosis not present

## 2018-08-17 DIAGNOSIS — N2581 Secondary hyperparathyroidism of renal origin: Secondary | ICD-10-CM | POA: Diagnosis not present

## 2018-08-17 DIAGNOSIS — D689 Coagulation defect, unspecified: Secondary | ICD-10-CM | POA: Diagnosis not present

## 2018-08-17 DIAGNOSIS — D649 Anemia, unspecified: Secondary | ICD-10-CM | POA: Diagnosis not present

## 2018-08-17 DIAGNOSIS — Z23 Encounter for immunization: Secondary | ICD-10-CM | POA: Diagnosis not present

## 2018-08-17 DIAGNOSIS — N186 End stage renal disease: Secondary | ICD-10-CM | POA: Diagnosis not present

## 2018-08-28 ENCOUNTER — Other Ambulatory Visit: Payer: Self-pay

## 2018-08-28 ENCOUNTER — Ambulatory Visit (HOSPITAL_COMMUNITY)
Admission: RE | Admit: 2018-08-28 | Discharge: 2018-08-28 | Disposition: A | Discharge status: Home / Self Care | Payer: Medicare HMO | Source: Ambulatory Visit | Attending: Family Medicine | Admitting: Family Medicine

## 2018-08-28 ENCOUNTER — Ambulatory Visit (INDEPENDENT_AMBULATORY_CARE_PROVIDER_SITE_OTHER)
Admission: RE | Admit: 2018-08-28 | Discharge: 2018-08-28 | Disposition: A | Discharge status: Home / Self Care | Payer: Medicare HMO | Source: Ambulatory Visit | Attending: Family Medicine | Admitting: Family Medicine

## 2018-08-28 ENCOUNTER — Ambulatory Visit (INDEPENDENT_AMBULATORY_CARE_PROVIDER_SITE_OTHER): Payer: Medicare HMO | Admitting: Vascular Surgery

## 2018-08-28 ENCOUNTER — Encounter: Payer: Self-pay | Admitting: Vascular Surgery

## 2018-08-28 VITALS — BP 145/73 | HR 87 | Resp 16 | Ht 68.0 in | Wt 108.0 lb

## 2018-08-28 DIAGNOSIS — Z992 Dependence on renal dialysis: Secondary | ICD-10-CM | POA: Diagnosis not present

## 2018-08-28 DIAGNOSIS — N186 End stage renal disease: Secondary | ICD-10-CM

## 2018-08-28 DIAGNOSIS — N185 Chronic kidney disease, stage 5: Secondary | ICD-10-CM

## 2018-08-28 NOTE — Progress Notes (Signed)
Patient name: [name] MRN: [id number] DOB: 10-Nov-1940 Sex: female  REASON FOR CONSULT: New AV fistula access  HPI: [name] is a 78 y.o. female, with end-stage renal disease currently on hemodialysis via right IJ tunneled catheter, coronary artery disease status post CABG, heart failure, aortic stenosis presents for new dialysis access.  Patient previously underwent a left radiocephalic fistula with subsequent revision last on 07/02/2015 by Dr. Kellie Simmering.  Ultimately she states that the fistula was placed several years before she started dialysis and she only used it one time before thrombosed.  She is right-hand dominant and has a right IJ tunnel catheter at this time.  She is retired and lives at home.  Does not care which arm is used for access.  Past Medical History:  Diagnosis Date  . Anemia    Pt is taking iron.   [name] Anxiety   . Arthritis   . Carotid stenosis    40-59% bilateral ICA stenosis in 2/12.  . Chronic low back pain   . CKD (chronic kidney disease)    Dr. Audie Clear at [facility]  . Coronary artery disease    Pt presented 2/10 to [facility] with NSTEMI and diastolic CHF exacerbation.  LHC was done  3/10 showing 99% pRCA stenosis and 80% calcified pLAD stenosis with L=>R collaterals.  Pt was referred  for CABG which was done by Dr. Prescott Gum with LIMA-LAD, SVG-RCA, SVG-OM.  . Diabetes mellitus   . Diabetic neuropathy ([place])   . Diastolic CHF (HCC)    Echo (2/10) showed EF 55-65%, mild LVH, diastolic dysfunction, mild AS with mean gradient 12 mmHg, PASP 43 mmHg.  Echo (2/12): EF 55-60%, mild LVH, mild AS (mean gradient 12), PA systolic pressure 32 mmHg.     [name] GERD (gastroesophageal reflux disease)   . Heart murmur   . Hyperlipidemia   . Hypertension   . Mild aortic stenosis    mean gradient 12 mmHg in 2/12.  . Myocardial infarction ([place])    "mild"  . Pneumonia   . PONV (postoperative nausea and vomiting)   . Thrombocytopenia ([place])   . Unsteady gait      Past Surgical History:  Procedure Laterality Date  . AV FISTULA PLACEMENT Left 01/30/2015   Procedure: Creation of a Radial Cephalic AV Fistula left wrist;  Surgeon: [name], MD;  Location: [place];  Service: Vascular;  Laterality: Left;  . BACK SURGERY     multiple  . BREAST SURGERY     biopsy  . CARDIAC CATHETERIZATION    . CATARACT EXTRACTION W/ INTRAOCULAR LENS  IMPLANT, BILATERAL    . COLONOSCOPY Left 11/03/2016   Procedure: COLONOSCOPY;  Surgeon: [name], MD;  Location: [facility] ENDOSCOPY;  Service: Endoscopy;  Laterality: Left;  . COLONOSCOPY W/ BIOPSIES AND POLYPECTOMY    . CORONARY ARTERY BYPASS GRAFT  09/2008   pt with NSTEMI and diastolic CHF exacerbation.  LHC was done  3/10 showing 99% pRCA stenosis and 80% calcified pLAD stenosis with L=>R collaterals.  Pt was referred  for CABG which was done by Dr. Prescott Gum with LIMA-LAD, SVG-RCA, SVG-OM.  [name] ESOPHAGOGASTRODUODENOSCOPY N/A 11/02/2016   Procedure: ESOPHAGOGASTRODUODENOSCOPY (EGD);  Surgeon: [name], MD;  Location: [facility] ENDOSCOPY;  Service: Endoscopy;  Laterality: N/A;  . GIVENS CAPSULE STUDY N/A 11/29/2016   Procedure: GIVENS CAPSULE STUDY;  Surgeon: [name], MD;  Location: [place];  Service: Endoscopy;  Laterality: N/A;  . REVISON OF ARTERIOVENOUS FISTULA Left 07/02/2015  Procedure: REVISON OF LEFT RADIOCEPHALIC ARTERIOVENOUS FISTULA;  Surgeon: [name], MD;  Location: [place];  Service: Vascular;  Laterality: Left;  . RIGHT/LEFT HEART CATH AND CORONARY/GRAFT ANGIOGRAPHY N/A 05/18/2018   Procedure: RIGHT/LEFT HEART CATH AND CORONARY/GRAFT ANGIOGRAPHY;  Surgeon: Leonie Man, MD;  Location: [place] CV LAB;  Service: Cardiovascular;  Laterality: N/A;    Family History  Adopted: Yes  Family history unknown: Yes    SOCIAL HISTORY: Social History   Socioeconomic History  . Marital status: Married    Spouse name: Not on file  . Number of children: Not on file  . Years of education: Not on  file  . Highest education level: Not on file  Occupational History  . Not on file  Social Needs  . Financial resource strain: Not on file  . Food insecurity:    Worry: Not on file    Inability: Not on file  . Transportation needs:    Medical: Not on file    Non-medical: Not on file  Tobacco Use  . Smoking status: Former Smoker    Types: Cigarettes  . Smokeless tobacco: Never Used  . Tobacco comment: quit 1988  Substance and Sexual Activity  . Alcohol use: Yes    Comment: beer daily  . Drug use: No  . Sexual activity: Never    Partners: Male  Lifestyle  . Physical activity:    Days per week: Not on file    Minutes per session: Not on file  . Stress: Not on file  Relationships  . Social connections:    Talks on phone: Not on file    Gets together: Not on file    Attends religious service: Not on file    Active member of club or organization: Not on file    Attends meetings of clubs or organizations: Not on file    Relationship status: Not on file  . Intimate partner violence:    Fear of current or ex partner: Not on file    Emotionally abused: Not on file    Physically abused: Not on file    Forced sexual activity: Not on file  Other Topics Concern  . Not on file  Social History Narrative  . Not on file    Allergies  Allergen Reactions  . Doxycycline Nausea And Vomiting    Caused "DEATHLY NAUSEA AND VOMITING"  . Lipitor [Atorvastatin] Other (See Comments)    Stomach pain  . Strawberry Extract Rash    Current Outpatient Medications  Medication Sig Dispense Refill  . ALPRAZolam (XANAX) 1 MG tablet Take 1 mg by mouth at bedtime.     . AMITIZA 24 MCG capsule Take 24 mcg by mouth See admin instructions. Take 24 mcg by mouth two times a day, every other day    . b complex-vitamin c-folic acid (NEPHRO-VITE) 0.8 MG TABS tablet Take 1 tablet by mouth daily.  3  . calcitRIOL (ROCALTROL) 0.5 MCG capsule Take 0.5 mcg by mouth daily.  1  . folic acid (FOLVITE) 1 MG  tablet Take 1 tablet (1 mg total) by mouth daily. 30 tablet 0  . midodrine (PROAMATINE) 10 MG tablet Take 1 tablet (10 mg total) by mouth 2 (two) times daily with a meal. 60 tablet 0  . Nutritional Supplements (FEEDING SUPPLEMENT, NEPRO CARB STEADY,) LIQD Take 237 mLs by mouth 2 (two) times daily between meals. 30 Can 0  . rosuvastatin (CRESTOR) 5 MG tablet Take 1 tablet (5 mg total) by  mouth at bedtime. 90 tablet 3  . sevelamer carbonate (RENVELA) 800 MG tablet Take 1,600 mg 3 (three) times daily with meals by mouth.     . trimethoprim (TRIMPEX) 100 MG tablet Take 50 mg by mouth daily.     . vitamin B-12 (CYANOCOBALAMIN) 500 MCG tablet Take 1 tablet (500 mcg total) by mouth daily. 30 tablet 0   No current facility-administered medications for this visit.     REVIEW OF SYSTEMS:  [X]  denotes positive finding, [ ]  denotes negative finding Cardiac  Comments:  Chest pain or chest pressure:    Shortness of breath upon exertion:    Short of breath when lying flat:    Irregular heart rhythm:        Vascular    Pain in calf, thigh, or hip brought on by ambulation:    Pain in feet at night that wakes you up from your sleep:     Blood clot in your veins:    Leg swelling:         Pulmonary    Oxygen at home:    Productive cough:     Wheezing:         Neurologic    Sudden weakness in arms or legs:     Sudden numbness in arms or legs:     Sudden onset of difficulty speaking or slurred speech:    Temporary loss of vision in one eye:     Problems with dizziness:         Gastrointestinal    Blood in stool:     Vomited blood:         Genitourinary    Burning when urinating:     Blood in urine:        Psychiatric    Major depression:         Hematologic    Bleeding problems:    Problems with blood clotting too easily:        Skin    Rashes or ulcers:        Constitutional    Fever or chills:      PHYSICAL EXAM: Vitals:   08/28/18 1208  BP: (!) 145/73  Pulse: 87  Resp: 16   SpO2: 99%  Weight: 108 lb (49 kg)  Height: 5\' 8"  (1.727 m)    GENERAL: The patient is a well-nourished female, in no acute distress. The vital signs are documented above. CARDIAC: There is a regular rate and rhythm.  VASCULAR:  2+ radial pulse palpable bilateral upper extremity 2+ brachial pulse palpable bilateral upper extremity No upper extremity tissue loss RIJ tunneled catheter PULMONARY: There is good air exchange bilaterally without wheezing or rales. ABDOMEN: Soft and non-tender with normal pitched bowel sounds.  MUSCULOSKELETAL: There are no major deformities or cyanosis. NEUROLOGIC: No focal weakness or paresthesias are detected.  DATA:   I reviewed her vein mapping which shows that the cephalic vein in her left arm that was previously used for radiocephalic fistula has several small segments around 2.1 mm in the upper arm.  She does have a nice basilic vein in the left arm.  Assessment/Plan:  Discussed with Mrs. Lewers options moving forward given her vein mapping.  She has a right IJ tunnel catheter that has been in place for about 4 months and she is right-hand dominant.  As result I have recommended left AV fistula placement.  Her previous right radiocephalic fistula has been thrombosed for some time.  She has a  nice basilic vein in her left arm and we discussed two-stage operation.  I discussed with her risk and benefits including bleeding, infection, steal, nerve damage, vessel damage, failure to mature, etc.  We will set her up for next available date.   Marty Heck, MD [facility] Office: [phone] Pager: [place]

## 2018-09-05 ENCOUNTER — Other Ambulatory Visit: Payer: Self-pay

## 2018-09-05 ENCOUNTER — Encounter (HOSPITAL_COMMUNITY): Payer: Self-pay | Admitting: *Deleted

## 2018-09-05 DIAGNOSIS — Z23 Encounter for immunization: Secondary | ICD-10-CM | POA: Diagnosis not present

## 2018-09-05 DIAGNOSIS — N186 End stage renal disease: Secondary | ICD-10-CM | POA: Diagnosis not present

## 2018-09-05 DIAGNOSIS — D689 Coagulation defect, unspecified: Secondary | ICD-10-CM | POA: Diagnosis not present

## 2018-09-05 DIAGNOSIS — N2581 Secondary hyperparathyroidism of renal origin: Secondary | ICD-10-CM | POA: Diagnosis not present

## 2018-09-05 DIAGNOSIS — D649 Anemia, unspecified: Secondary | ICD-10-CM | POA: Diagnosis not present

## 2018-09-05 DIAGNOSIS — E611 Iron deficiency: Secondary | ICD-10-CM | POA: Diagnosis not present

## 2018-09-05 NOTE — Anesthesia Preprocedure Evaluation (Addendum)
Anesthesia Evaluation  Patient identified by MRN, date of birth, ID band Patient awake    Reviewed: Allergy & Precautions, Patient's Chart, lab work & pertinent test results, reviewed documented beta blocker date and time   History of Anesthesia Complications (+) PONV and history of anesthetic complications  Airway Mallampati: II  TM Distance: >3 FB Neck ROM: Full    Dental no notable dental hx. (+) Dental Advisory Given   Pulmonary neg pulmonary ROS, former smoker,    Pulmonary exam normal        Cardiovascular hypertension, + CAD, + Past MI, + CABG and +CHF  + Valvular Problems/Murmurs AS  Rhythm:Regular Rate:Normal + Systolic murmurs Impressions:  - Normal LV size with mild LV hypertrophy. EF 20-25% with wall   motion abnormalities as noted above. Restrictive diastolic   function. Normal RV size with mild to moderate systolic   dysfunction. Moderate MR, moderate TR. Mild pulmonary   hypertension. The aortic valve appeared functionally bicuspid   (left and right cusps fused). There was severe calcification.   Mean gradient 11 with AVA 1.01 cm^2 by continuity equation.   Visually, think low gradient moderate aortic stenosis is most   Likely.  Severe two-vessel native CAD with diffuse 95% followed by 100% CTO of the mid RCA (fills via left to right collaterals), and 99% proximal LAD prior to D1 followed by 100% CTO in the mid. Widely patent native Circumflex-OM with mild diffuse disease. Patent LIMA-LAD with heavy calcification in the subclavian artery near the takeoff as well as a proximal calcified 40% stenosis. Chronic total occlusion of SVG-RCA and SVG-OM. --Extent of CAD is essentially not much different than prior to CABG with exception of now CTO of the RCA is post a subtotal occlusion.  The only new lesion that was not present on pre-CABG films is a 99% proximal LAD lesion prior to D1. -->  Reduction in LVEF is out of  proportion with extent of CAD change.  Relatively normal PA pressures with PCWP of 18 and LVEDP of 22. Probably moderate aortic stenosis with minimal gradient but low output by thermodilution. Conflicting Cardiac Output/Index by Fick and thermodilution.  Thermodilution estimates 3.57/2.19, Fick estimates 5.09/3.13.  Recommendation: Continued medical management of cardiomyopathy and volume control with dialysis. Does not appear to have low output aortic stenosis given the minimal gradient in the Cath Lab. Consider nonischemic etiology for reduced EF.   Neuro/Psych PSYCHIATRIC DISORDERS Anxiety CVA    GI/Hepatic Neg liver ROS, GERD  ,  Endo/Other  negative endocrine ROSdiabetes  Renal/GU CRFRenal disease     Musculoskeletal negative musculoskeletal ROS (+)   Abdominal   Peds  Hematology negative hematology ROS (+)   Anesthesia Other Findings Day of surgery medications reviewed with the patient.  Reproductive/Obstetrics                                                           Anesthesia Evaluation  Patient identified by MRN, date of birth, ID band Patient awake    Reviewed: Allergy & Precautions, NPO status , Patient's Chart, lab work & pertinent test results, reviewed documented beta blocker date and time   History of Anesthesia Complications (+) PONV and history of anesthetic complications  Airway Mallampati: II  TM Distance: >3 FB Neck ROM: Full    Dental  (+)  Teeth Intact, Dental Advisory Given   Pulmonary former smoker,    Pulmonary exam normal breath sounds clear to auscultation       Cardiovascular hypertension, Pt. on home beta blockers and Pt. on medications + CAD, + Past MI, + CABG, + Peripheral Vascular Disease and +CHF  + Valvular Problems/Murmurs AS  Rhythm:Regular Rate:Normal + Systolic murmurs Echo 02/24/44: Study Conclusions  - Left ventricle: The cavity size was normal. There was moderate focal basal and  mild concentric hypertrophy. Systolic function was normal. The estimated ejection fraction was in the range of 55% to 60%. Wall motion was normal; there were no regional wall motion abnormalities. Features are consistent with a pseudonormal left ventricular filling pattern, with concomitant abnormal   relaxation and increased filling pressure (grade 2 diastolic dysfunction). - Aortic valve: The AV appears mildly to moderately stenotic by doppler with a mean AV gradient of 32mHg. The calculated AVA is 0.9cm2 and likely underestimates with AVA due to inaccurate measurment of the LVOT. Visually there appears to be mild to moderate AS. Valve area (VTI): 0.9 cm^2. Valve area (Vmax): 0.89 cm^2. Valve area (Vmean): 0.86 cm^2. - Mitral valve: There was trivial regurgitation. - Right ventricle: Systolic function was mildly reduced. - Pulmonic valve: There was mild regurgitation. - Pulmonary arteries: PA peak pressure: 33 mm Hg (S).   Neuro/Psych PSYCHIATRIC DISORDERS Anxiety CVA    GI/Hepatic Neg liver ROS, GERD  ,  Endo/Other  diabetes (diabetic neuropathy), Type 2  Renal/GU Renal InsufficiencyRenal disease     Musculoskeletal  (+) Arthritis ,   Abdominal   Peds  Hematology  (+) Blood dyscrasia (Thrombocytopenia--plt 107k), anemia ,   Anesthesia Other Findings Day of surgery medications reviewed with the patient.  Reproductive/Obstetrics                            Anesthesia Physical Anesthesia Plan  ASA: III  Anesthesia Plan: MAC   Post-op Pain Management:    Induction: Intravenous  Airway Management Planned: Nasal Cannula  Additional Equipment:   Intra-op Plan:   Post-operative Plan:   Informed Consent: I have reviewed the patients History and Physical, chart, labs and discussed the procedure including the risks, benefits and alternatives for the proposed anesthesia with the patient or authorized representative who has indicated his/her  understanding and acceptance.   Dental advisory given  Plan Discussed with: CRNA and Anesthesiologist  Anesthesia Plan Comments: (Discussed risks/benefits/alternatives to MAC sedation including need for ventilatory support, hypotension, need for conversion to general anesthesia.  All patient questions answered.  Patient/guardian wishes to proceed.)        Anesthesia Quick Evaluation                                   Anesthesia Evaluation  Patient identified by MRN, date of birth, ID band Patient awake    Reviewed: Allergy & Precautions, NPO status , Patient's Chart, lab work & pertinent test results, reviewed documented beta blocker date and time   History of Anesthesia Complications (+) PONV and history of anesthetic complications  Airway Mallampati: II  TM Distance: >3 FB Neck ROM: Full    Dental  (+) Teeth Intact, Dental Advisory Given   Pulmonary former smoker,    Pulmonary exam normal breath sounds clear to auscultation       Cardiovascular hypertension, Pt. on home beta blockers and Pt. on medications +  CAD, + Past MI, + CABG, + Peripheral Vascular Disease and +CHF  + Valvular Problems/Murmurs AS  Rhythm:Regular Rate:Normal + Systolic murmurs Echo 4/54/09: Study Conclusions  - Left ventricle: The cavity size was normal. There was moderate focal basal and mild concentric hypertrophy. Systolic function was normal. The estimated ejection fraction was in the range of 55% to 60%. Wall motion was normal; there were no regional wall motion abnormalities. Features are consistent with a pseudonormal left ventricular filling pattern, with concomitant abnormal   relaxation and increased filling pressure (grade 2 diastolic dysfunction). - Aortic valve: The AV appears mildly to moderately stenotic by doppler with a mean AV gradient of 32mHg. The calculated AVA is 0.9cm2 and likely underestimates with AVA due to inaccurate measurment of the LVOT. Visually there  appears to be mild to moderate AS. Valve area (VTI): 0.9 cm^2. Valve area (Vmax): 0.89 cm^2. Valve area (Vmean): 0.86 cm^2. - Mitral valve: There was trivial regurgitation. - Right ventricle: Systolic function was mildly reduced. - Pulmonic valve: There was mild regurgitation. - Pulmonary arteries: PA peak pressure: 33 mm Hg (S).   Neuro/Psych PSYCHIATRIC DISORDERS Anxiety CVA    GI/Hepatic Neg liver ROS, GERD  ,  Endo/Other  diabetes (diabetic neuropathy), Type 2  Renal/GU Renal InsufficiencyRenal disease     Musculoskeletal  (+) Arthritis ,   Abdominal   Peds  Hematology  (+) Blood dyscrasia (Thrombocytopenia--plt 107k), anemia ,   Anesthesia Other Findings Day of surgery medications reviewed with the patient.  Reproductive/Obstetrics                            Anesthesia Physical Anesthesia Plan  ASA: III  Anesthesia Plan: MAC   Post-op Pain Management:    Induction: Intravenous  Airway Management Planned: Nasal Cannula  Additional Equipment:   Intra-op Plan:   Post-operative Plan:   Informed Consent: I have reviewed the patients History and Physical, chart, labs and discussed the procedure including the risks, benefits and alternatives for the proposed anesthesia with the patient or authorized representative who has indicated his/her understanding and acceptance.   Dental advisory given  Plan Discussed with: CRNA and Anesthesiologist  Anesthesia Plan Comments: (Discussed risks/benefits/alternatives to MAC sedation including need for ventilatory support, hypotension, need for conversion to general anesthesia.  All patient questions answered.  Patient/guardian wishes to proceed.)        Anesthesia Quick Evaluation                                   Anesthesia Evaluation  Patient identified by MRN, date of birth, ID band Patient awake    Reviewed: Allergy & Precautions, NPO status , Patient's Chart, lab work & pertinent test  results  Airway Mallampati: I       Dental   Pulmonary former smoker,    Pulmonary exam normal        Cardiovascular hypertension, Normal cardiovascular exam     Neuro/Psych    GI/Hepatic   Endo/Other  diabetes  Renal/GU      Musculoskeletal   Abdominal Normal abdominal exam  (+)   Peds  Hematology   Anesthesia Other Findings Study Result   Result status: Final result                             *[facility]*                   *  [address] [place], [place] [address]                            [phone]  ------------------------------------------------------------------- Transthoracic Echocardiography  Patient:    Jeanne, [name] MR #:       [id number] Study Date: 09/27/2016 Gender:     F Age:        78 Height:     172.7 cm Weight:     61.4 kg BSA:        1.71 m^2 Pt. Status: Room:       5M14C   ADMITTING    Gwinda [place]  REFERRING    Geradine Girt  PERFORMING   [facility], Inpatient  SONOGRAPHER  Darlina Sicilian, RDCS  cc:  ------------------------------------------------------------------- LV EF: 55% -   60%  ------------------------------------------------------------------- Indications:      CVA 436.  ------------------------------------------------------------------- History:   PMH:  Anemia. Chronic Kidney Disease. Carotid Artery Stenosis.  Murmur.  Coronary artery disease.  PMH:   Myocardial infarction.  Risk factors:  Hypertension. Diabetes mellitus. Dyslipidemia.    Reproductive/Obstetrics                            Anesthesia Physical Anesthesia Plan  ASA: III  Anesthesia Plan: MAC   Post-op Pain Management:    Induction: Intravenous  Airway Management Planned:   Additional Equipment:   Intra-op Plan:   Post-operative Plan:   Informed  Consent: I have reviewed the patients History and Physical, chart, labs and discussed the procedure including the risks, benefits and alternatives for the proposed anesthesia with the patient or authorized representative who has indicated his/her understanding and acceptance.     Plan Discussed with: CRNA and Surgeon  Anesthesia Plan Comments:         Anesthesia Quick Evaluation  Anesthesia Physical Anesthesia Plan  ASA: IV  Anesthesia Plan: MAC   Post-op Pain Management:    Induction:   PONV Risk Score and Plan: 3 and Ondansetron, Propofol infusion and Dexamethasone  Airway Management Planned: Natural Airway  Additional Equipment:   Intra-op Plan:   Post-operative Plan:   Informed Consent: I have reviewed the patients History and Physical, chart, labs and discussed the procedure including the risks, benefits and alternatives for the proposed anesthesia with the patient or authorized representative who has indicated his/her understanding and acceptance.     Dental advisory given  Plan Discussed with: Anesthesiologist and CRNA  Anesthesia Plan Comments: (See PAT note by Karoline Caldwell, PA-C )     Anesthesia Quick Evaluation

## 2018-09-05 NOTE — Progress Notes (Signed)
Anesthesia Chart Review: SAME DAY WORKUP   Case:  [id number] Date/Time:  09/06/18 0730   Procedure:  LEFT ARM ARTERIOVENOUS (AV) FISTULA CREATION (Left )   Anesthesia type:  Monitor Anesthesia Care   Pre-op diagnosis:  END STAGE RENAL DISEASE   Location:  [facility] OR ROOM 16 / [place] [facility]   Surgeon:  Marty Heck, MD      DISCUSSION: 78 yo female former smoker. HTN, HLD, CAD/NSTEMI '10 s/p CABG (LIMA-LAD, SVG-RCA, SVG-OM), DM2, ESRD on HD, thrombocytopenia, carotid artery stenosis, diastolic CHF, mod AS and mod MR (05/2018 echo), Stroke (2018), GIB (2018)  Recent extensive cardiac workup during multiple hospitalizations. The following is copied from Reader note 06/26/2018 by Truitt Merle, NP: "October - she was admitted from the office with chest pain/shortness of breath/failure to thrive as well as new LBBB. She had started dialysis the month before. Given her presentingsymptoms, she underwent afull cardiac work-up including cardiac catheterization which showed extensive two-vessel coronary artery disease with recommendations for continued medical management and secondary prevention. She had an echocardiogram which showed a new decrease in her LV function with an LVEF noted to be 20 to 25%. She was noted to again be quite anemic - required transfusion. She did not wish to have GI work up. Cardiology was consulted during that time and did not feel that this was ischemic in nature.   She presented back to the hospital on 05/26/2017 with worsening shortness of breath however denieschest pain, recent illness, fever, chills, cough, LE swelling, nausea or vomiting, dizziness, presyncope or syncopal episodes.She reports compliance with medications, dialysisand fluid and salt restrictions.In the ED, CXR showed fluid volume overload. On presentation, BNP was noted to be markedly elevated at greater than 4000.She was admitted to hospitalist service given several comorbid conditions for fluid volume  management anddialysistreatment.  Cardiology was once again consulted for evaluation of fluid management...  1.CAD - recent cath - noted as above - to manage medically. She is basically on no cardiac medicines other than her statin due to BP issues with dialysis. Fortunately, not having chest pain at this time. She will not be able to be on guideline therapy due to low BP and having to now be on Midodrine to tolerate her dialysis. Overall prognosis is tenuous at best.  2. 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 She is on chronic dialysis - tolerating ok.  3. Bicuspid AV with moderate AS noted on recent R heart cath - no cardinal symptoms - will follow. "  Pt will need DOS eval by assigned anesthesiologist. Anticipate she can proceed as planned if asymptomatic and euvolemic.   VS: There were no vitals taken for this visit.  PROVIDERS: [name], MD is PCP  Loralie Champagne, MD is Cardiologist, however pt follows mostly with Truitt Merle, NP  [facility] for nephrology care   LABS: Will need DOS labs  Labs Reviewed - No data to display   IMAGES: PORTABLE CHEST 1 VIEW 05/29/2018  COMPARISON:  May 27, 2018  FINDINGS: The Vas-Cath is stable. There is a small right pleural effusion with underlying atelectasis. Mild pulmonary venous congestion/mild edema. Stable cardiomediastinal silhouette. No other change.  IMPRESSION: Cardiomegaly and pulmonary venous congestion/mild edema. Stable small right effusion.   EKG: 05/15/2018: Sinus rhythm. Rate 97. PACs, nonspecific intraventricular conduction delay. See comments on tracing.  CV: Cath 17/12/1023:  LV end diastolic  pressure is moderately elevated.  There is mild-moderate aortic valve stenosis. --Does not meet criteria for low output aortic stenosis.  Prox RCA lesion is 95% stenosed.  Prox RCA to Mid RCA lesion is 95% stenosed. Mid RCA lesion is 100% stenosed -CTO at site of initial subtotal occlusion  SVG-RCA graft was visualized by angiography. Origin lesion is 100% stenosed.  Minimal to mild disease in the circumflex which essentially is a large OM branch.  SVG-CxOM graft was visualized by angiography. Origin lesion is 100% stenosed.  Prox LAD lesion is 99% stenosed with 70% stenosed side branch in Ost 1st Diag. -This represents the only significant new lesion from pre-CABG.  Prox LAD to Mid LAD lesion is 100% stenosed -CTO at original 80% stenosis site.  LIMA-mLAD graft was visualized by angiography and is large. Origin lesion is 40% stenosed.  Heavily calcified left subclavian artery at the takeoff of LIMA and vertebral artery.   Severe two-vessel native CAD with diffuse 95% followed by 100% CTO of the mid RCA (fills via left to right collaterals), and 99% proximal LAD prior to D1 followed by 100% CTO in the mid. Widely patent native Circumflex-OM with mild diffuse disease. Patent LIMA-LAD with heavy calcification in the subclavian artery near the takeoff as well as a proximal calcified 40% stenosis. Chronic total occlusion of SVG-RCA and SVG-OM. --Extent of CAD is essentially not much different than prior to CABG with exception of now CTO of the RCA is post a subtotal occlusion.  The only new lesion that was not present on pre-CABG films is a 99% proximal LAD lesion prior to D1. -->  Reduction in LVEF is out of proportion with extent of CAD change.  Relatively normal PA pressures with PCWP of 18 and LVEDP of 22. Probably moderate aortic stenosis with minimal gradient but low output by thermodilution. Conflicting Cardiac Output/Index by Fick and thermodilution.  Thermodilution estimates 3.57/2.19, Fick estimates 5.09/3.13.  Recommendation: Continued medical management of cardiomyopathy and volume control with dialysis. Does not appear to have low output aortic  stenosis given the minimal gradient in the Cath Lab. Consider nonischemic etiology for reduced EF.  Recommend Aspirin 81mg  daily for severe CAD.Glenetta Hew, MD  TTE 05/16/2018: Study Conclusions  - Left ventricle: The cavity size was normal. Wall thickness was   increased in a pattern of mild LVH. Systolic function was   severely reduced. The estimated ejection fraction was in the   range of 20% to 25%. Diffuse hypokinesis with relative   preservation of the basal anterior wall and the basal to mid   lateral wall. Septal-lateral dyssynychrony. Doppler parameters   are consistent with restrictive physiology, indicative of   decreased left ventricular diastolic compliance and/or increased   left atrial pressure. E/medial e&' > 15 suggests LV end diastolic   pressure at least 20 mmHg. - Aortic valve: Functionally bicuspid with apparent fusion of the   right and left coronary cusps; severely calcified leaflets. There   was trivial regurgitation. Overall, suspect moderate, low   gradient aortic stenosis. Visually does not appear severe (not   marked turbulence). Mean gradient (S): 11 mm Hg. Valve area   (VTI): 1.01 cm^2. - Mitral valve: Mildly calcified annulus. There was moderate   regurgitation. - Left atrium: The atrium was moderately dilated. - Right ventricle: The cavity size was normal. Systolic function   was mildly to moderately reduced. - Right atrium: The atrium was moderately dilated. - Tricuspid valve: There was moderate regurgitation. Peak RV-RA  gradient (S): 39 mm Hg. - Pulmonary arteries: PA peak pressure: 47 mm Hg (S). - Systemic veins: IVC measured 2.0 cm with < 50% respirophasic   variation, suggesting RA pressure 8 mmHg.  Impressions:  - Normal LV size with mild LV hypertrophy. EF 20-25% with wall   motion abnormalities as noted above. Restrictive diastolic   function. Normal RV size with mild to moderate systolic   dysfunction. Moderate MR,  moderate TR. Mild pulmonary   hypertension. The aortic valve appeared functionally bicuspid   (left and right cusps fused). There was severe calcification.   Mean gradient 11 with AVA 1.01 cm^2 by continuity equation.   Visually, think low gradient moderate aortic stenosis is most   likely.  Carotid US 09/27/2016: Summary: Findings consistent with a high end 1- 39 percent stenosis involving the right internal carotid artery and the left internal carotid artery. Can not rule out higher grade stenosis due to severe amount of shadowing plaque in bilateral carotid arteries and difficulty visualizing vessels. Severe amount of plaque are subjectively suggestive of a 40-59% percent stenosis bilaterally. The right vertebral demonstrates atypical flow, left vertebral is patent and antegrade.  Myoview Study Highlights 02/2016    Nuclear stress EF: 54%. There is mid inferior wall hypokinesis  There was no ST segment deviation noted during stress.  Defect 1: There is a medium defect of moderate severity present in the basal inferior and mid inferior location.  The mid inferior wall perfusion defect is reversible consistent with ischemia. The basal inferior wall defect is mostly fixed consistent with infarct.  This is an intermediate risk study. Consider occlusion of SVG to RCA graft.  Candee Furbish, MD    Past Medical History:  Diagnosis Date  . Anemia    Pt is taking iron.   [name] Anxiety   . Arthritis   . Carotid stenosis    40-59% bilateral ICA stenosis in 2/12.  . Chronic low back pain   . CKD (chronic kidney disease)    Dr. Audie Clear at [facility]  . Coronary artery disease    Pt presented [facility]/10 to [facility] with NSTEMI and diastolic CHF exacerbation.  LHC was done  3/10 showing 99% pRCA stenosis and 80% calcified pLAD stenosis with L=>R collaterals.  Pt was referred  for CABG which was done by Dr. Prescott Gum with LIMA-LAD, SVG-RCA, SVG-OM.  . Diabetes mellitus   .  Diabetic neuropathy ([place])   . Diastolic CHF ([facility])    Echo (2/10) showed EF 55-65%, mild LVH, diastolic dysfunction, mild AS with mean gradient 12 mmHg, PASP 43 mmHg.  Echo (2/12): EF 55-60%, mild LVH, mild AS (mean gradient 12), PA systolic pressure 32 mmHg.     [name] GERD (gastroesophageal reflux disease)   . Heart murmur   . Hyperlipidemia   . Hypertension   . Mild aortic stenosis    mean gradient 12 mmHg in 2/12.  . Myocardial infarction ([place])    "mild"  . Pneumonia   . PONV (postoperative nausea and vomiting)   . Thrombocytopenia ([place])   . Unsteady gait     Past Surgical History:  Procedure Laterality Date  . AV FISTULA PLACEMENT Left 01/30/2015   Procedure: Creation of a Radial Cephalic AV Fistula left wrist;  Surgeon: [name], MD;  Location: [place];  Service: Vascular;  Laterality: Left;  . BACK SURGERY     multiple  . BREAST SURGERY     biopsy  . CARDIAC CATHETERIZATION    .  CATARACT EXTRACTION W/ INTRAOCULAR LENS  IMPLANT, BILATERAL    . COLONOSCOPY Left 11/03/2016   Procedure: COLONOSCOPY;  Surgeon: [name], MD;  Location[facility]COPY;  Service: Endoscopy;  Laterality: Left;  . COLONOSCOPY W/ BIOPSIES AND POLYPECTOMY    . CORONARY ARTERY BYPASS GRAFT  09/2008   pt with NSTEMI and diastolic CHF exacerbation.  LHC was done  3/10 showing 99% pRCA stenosis and 80% calcified pLAD stenosis with L=>R collaterals.  Pt was referred  for CABG which was done by Dr. Prescott Gum with LIMA-LAD, SVG-RCA, SVG-OM.  [name] ESOPHAGOGASTRODUODENOSCOPY N/A 11/02/2016   Procedure: ESOPHAGOGASTRODUODENOSCOPY (EGD);  Surgeon: [name], MD;  Location: [facility] ENDOSCOPY;  Service: Endoscopy;  Laterality: N/A;  . GIVENS CAPSULE STUDY N/A 11/29/2016   Procedure: GIVENS CAPSULE STUDY;  Surgeon: [name], MD;  Location: [place];  Service: Endoscopy;  Laterality: N/A;  . REVISON OF ARTERIOVENOUS FISTULA Left 07/02/2015   Procedure: REVISON OF LEFT RADIOCEPHALIC ARTERIOVENOUS FISTULA;  Surgeon: [name], MD;  Location: [place];  Service: Vascular;  Laterality: Left;  . RIGHT/LEFT HEART CATH AND CORONARY/GRAFT ANGIOGRAPHY N/A 05/18/2018   Procedure: RIGHT/LEFT HEART CATH AND CORONARY/GRAFT ANGIOGRAPHY;  Surgeon: Leonie Man, MD;  Location: [place] CV LAB;  Service: Cardiovascular;  Laterality: N/A;    MEDICATIONS: No current facility-administered medications for this encounter.    [name] ALPRAZolam (XANAX) 1 MG tablet  . AMITIZA 24 MCG capsule  . b complex-vitamin c-folic acid (NEPHRO-VITE) 0.8 MG TABS tablet  . calcitRIOL (ROCALTROL) 0.5 MCG capsule  . folic acid (FOLVITE) 1 MG tablet  . midodrine (PROAMATINE) 10 MG tablet  . rosuvastatin (CRESTOR) 5 MG tablet  . sevelamer carbonate (RENVELA) 800 MG tablet  . trimethoprim (TRIMPEX) 100 MG tablet  . vitamin B-12 (CYANOCOBALAMIN) 500 MCG tablet  . Nutritional Supplements (FEEDING SUPPLEMENT, NEPRO CARB STEADY,) LIQD

## 2018-09-05 NOTE — Progress Notes (Signed)
Pt denies SOB and chest pain. Pt stated that she is under the care of L. [name], NP, Cardiology. Pt stated that she does not check her blood glucose since she is no longer being treated for diabetes. Pt made aware to stop taking vitamins, fish oil and herbal medications. Do not take any NSAIDs ie: Ibuprofen, Advil, Naproxen (Aleve), Motrin, BC and Goody Powder. Pt verbalized understanding of all pre-op instructions. PA, Anesthesiology, asked to review pt history.

## 2018-09-06 ENCOUNTER — Ambulatory Visit (HOSPITAL_COMMUNITY): Payer: Medicare HMO | Admitting: Physician Assistant

## 2018-09-06 ENCOUNTER — Ambulatory Visit (HOSPITAL_COMMUNITY)
Admission: RE | Admit: 2018-09-06 | Discharge: 2018-09-06 | Disposition: A | Discharge status: Home / Self Care | Payer: Medicare HMO | Source: Ambulatory Visit | Attending: Vascular Surgery | Admitting: Vascular Surgery

## 2018-09-06 ENCOUNTER — Encounter (HOSPITAL_COMMUNITY)
Admission: RE | Disposition: A | Discharge status: Home / Self Care | Payer: Self-pay | Source: Ambulatory Visit | Attending: Vascular Surgery

## 2018-09-06 ENCOUNTER — Telehealth: Payer: Self-pay | Admitting: Vascular Surgery

## 2018-09-06 ENCOUNTER — Encounter (HOSPITAL_COMMUNITY): Payer: Self-pay | Admitting: Surgery

## 2018-09-06 DIAGNOSIS — Q231 Congenital insufficiency of aortic valve: Secondary | ICD-10-CM | POA: Diagnosis not present

## 2018-09-06 DIAGNOSIS — I5043 Acute on chronic combined systolic (congestive) and diastolic (congestive) heart failure: Secondary | ICD-10-CM | POA: Diagnosis not present

## 2018-09-06 DIAGNOSIS — E785 Hyperlipidemia, unspecified: Secondary | ICD-10-CM | POA: Insufficient documentation

## 2018-09-06 DIAGNOSIS — Z79899 Other long term (current) drug therapy: Secondary | ICD-10-CM | POA: Insufficient documentation

## 2018-09-06 DIAGNOSIS — I252 Old myocardial infarction: Secondary | ICD-10-CM | POA: Insufficient documentation

## 2018-09-06 DIAGNOSIS — N186 End stage renal disease: Secondary | ICD-10-CM | POA: Insufficient documentation

## 2018-09-06 DIAGNOSIS — E1140 Type 2 diabetes mellitus with diabetic neuropathy, unspecified: Secondary | ICD-10-CM | POA: Diagnosis not present

## 2018-09-06 DIAGNOSIS — I2510 Atherosclerotic heart disease of native coronary artery without angina pectoris: Secondary | ICD-10-CM | POA: Insufficient documentation

## 2018-09-06 DIAGNOSIS — I2582 Chronic total occlusion of coronary artery: Secondary | ICD-10-CM | POA: Insufficient documentation

## 2018-09-06 DIAGNOSIS — I25810 Atherosclerosis of coronary artery bypass graft(s) without angina pectoris: Secondary | ICD-10-CM | POA: Diagnosis not present

## 2018-09-06 DIAGNOSIS — E1122 Type 2 diabetes mellitus with diabetic chronic kidney disease: Secondary | ICD-10-CM | POA: Insufficient documentation

## 2018-09-06 DIAGNOSIS — I132 Hypertensive heart and chronic kidney disease with heart failure and with stage 5 chronic kidney disease, or end stage renal disease: Secondary | ICD-10-CM | POA: Insufficient documentation

## 2018-09-06 DIAGNOSIS — Z87891 Personal history of nicotine dependence: Secondary | ICD-10-CM | POA: Diagnosis not present

## 2018-09-06 DIAGNOSIS — I429 Cardiomyopathy, unspecified: Secondary | ICD-10-CM | POA: Diagnosis not present

## 2018-09-06 DIAGNOSIS — I6523 Occlusion and stenosis of bilateral carotid arteries: Secondary | ICD-10-CM | POA: Insufficient documentation

## 2018-09-06 DIAGNOSIS — F419 Anxiety disorder, unspecified: Secondary | ICD-10-CM | POA: Insufficient documentation

## 2018-09-06 DIAGNOSIS — R69 Illness, unspecified: Secondary | ICD-10-CM | POA: Diagnosis not present

## 2018-09-06 DIAGNOSIS — I5042 Chronic combined systolic (congestive) and diastolic (congestive) heart failure: Secondary | ICD-10-CM | POA: Insufficient documentation

## 2018-09-06 DIAGNOSIS — N185 Chronic kidney disease, stage 5: Secondary | ICD-10-CM | POA: Diagnosis not present

## 2018-09-06 DIAGNOSIS — D631 Anemia in chronic kidney disease: Secondary | ICD-10-CM | POA: Insufficient documentation

## 2018-09-06 HISTORY — PX: AV FISTULA PLACEMENT: SHX1204

## 2018-09-06 HISTORY — DX: Cerebral infarction, unspecified: I63.9

## 2018-09-06 LAB — POCT I-STAT 4, (NA,K, GLUC, HGB,HCT)
Glucose, Bld: 122 mg/dL — ABNORMAL HIGH (ref 70–99)
HCT: 30 % — ABNORMAL LOW (ref 36.0–46.0)
Hemoglobin: 10.2 g/dL — ABNORMAL LOW (ref 12.0–15.0)
Potassium: 3.8 mmol/L (ref 3.5–5.1)
Sodium: 136 mmol/L (ref 135–145)

## 2018-09-06 LAB — GLUCOSE, CAPILLARY: Glucose-Capillary: 122 mg/dL — ABNORMAL HIGH (ref 70–99)

## 2018-09-06 SURGERY — ARTERIOVENOUS (AV) FISTULA CREATION
Anesthesia: Monitor Anesthesia Care | Site: Arm Upper | Laterality: Left

## 2018-09-06 MED ORDER — FENTANYL CITRATE (PF) 250 MCG/5ML IJ SOLN
INTRAMUSCULAR | Status: AC
  Filled 2018-09-06: qty 5

## 2018-09-06 MED ORDER — FENTANYL CITRATE (PF) 100 MCG/2ML IJ SOLN
INTRAMUSCULAR | Status: DC | PRN
  Administered 2018-09-06 (×2): 25 ug via INTRAVENOUS

## 2018-09-06 MED ORDER — CHLORHEXIDINE GLUCONATE CLOTH 2 % EX PADS: 6.0000 | MEDICATED_PAD | Freq: Once | CUTANEOUS | Status: DC

## 2018-09-06 MED ORDER — ACETAMINOPHEN 500 MG PO TABS
1000.0000 mg | ORAL_TABLET | Freq: Once | ORAL | Status: AC
  Administered 2018-09-06: 1000 mg via ORAL
  Filled 2018-09-06: qty 2

## 2018-09-06 MED ORDER — ONDANSETRON HCL 4 MG/2ML IJ SOLN
INTRAMUSCULAR | Status: DC | PRN
  Administered 2018-09-06: 4 mg via INTRAVENOUS

## 2018-09-06 MED ORDER — FENTANYL CITRATE (PF) 100 MCG/2ML IJ SOLN: 25.0000 ug | INTRAMUSCULAR | Status: DC | PRN

## 2018-09-06 MED ORDER — SODIUM CHLORIDE 0.9 % IV SOLN: INTRAVENOUS | Status: DC

## 2018-09-06 MED ORDER — SODIUM CHLORIDE 0.9 % IV SOLN
INTRAVENOUS | Status: DC | PRN
  Administered 2018-09-06: 500 mL

## 2018-09-06 MED ORDER — HEPARIN SODIUM (PORCINE) 1000 UNIT/ML IJ SOLN
INTRAMUSCULAR | Status: DC | PRN
  Administered 2018-09-06: 3000 [IU] via INTRAVENOUS

## 2018-09-06 MED ORDER — 0.9 % SODIUM CHLORIDE (POUR BTL) OPTIME
TOPICAL | Status: DC | PRN
  Administered 2018-09-06: 1000 mL

## 2018-09-06 MED ORDER — HYDROCODONE-ACETAMINOPHEN 5-325 MG PO TABS: 1.0000 | ORAL_TABLET | Freq: Four times a day (QID) | ORAL | 0 refills | Status: DC | PRN

## 2018-09-06 MED ORDER — PROMETHAZINE HCL 25 MG/ML IJ SOLN: 6.2500 mg | INTRAMUSCULAR | Status: DC | PRN

## 2018-09-06 MED ORDER — CEFAZOLIN SODIUM-DEXTROSE 2-4 GM/100ML-% IV SOLN
2.0000 g | INTRAVENOUS | Status: AC
  Administered 2018-09-06: 2 g via INTRAVENOUS
  Filled 2018-09-06: qty 100

## 2018-09-06 MED ORDER — SODIUM CHLORIDE 0.9 % IV SOLN
INTRAVENOUS | Status: DC | PRN
  Administered 2018-09-06: 20 ug/min via INTRAVENOUS

## 2018-09-06 MED ORDER — LIDOCAINE HCL (PF) 1 % IJ SOLN
INTRAMUSCULAR | Status: AC
  Filled 2018-09-06: qty 30

## 2018-09-06 MED ORDER — SODIUM CHLORIDE 0.9 % IV SOLN
INTRAVENOUS | Status: AC
  Filled 2018-09-06: qty 1.2

## 2018-09-06 MED ORDER — PROPOFOL 500 MG/50ML IV EMUL
INTRAVENOUS | Status: DC | PRN
  Administered 2018-09-06: 50 ug/kg/min via INTRAVENOUS

## 2018-09-06 MED ORDER — LIDOCAINE HCL (CARDIAC) PF 100 MG/5ML IV SOSY
PREFILLED_SYRINGE | INTRAVENOUS | Status: DC | PRN
  Administered 2018-09-06: 40 mg via INTRATRACHEAL

## 2018-09-06 MED ORDER — LIDOCAINE HCL 1 % IJ SOLN
INTRAMUSCULAR | Status: DC | PRN
  Administered 2018-09-06: 3 mL

## 2018-09-06 MED ORDER — SODIUM CHLORIDE 0.9 % IV SOLN
INTRAVENOUS | Status: DC | PRN
  Administered 2018-09-06: 07:00:00 via INTRAVENOUS

## 2018-09-06 SURGICAL SUPPLY — 35 items
ARMBAND PINK RESTRICT EXTREMIT (MISCELLANEOUS) ×2 IMPLANT
CANISTER SUCT 3000ML PPV (MISCELLANEOUS) ×2 IMPLANT
CLIP VESOCCLUDE MED 6/CT (CLIP) ×2 IMPLANT
CLIP VESOCCLUDE SM WIDE 6/CT (CLIP) ×4 IMPLANT
COVER PROBE W GEL 5X96 (DRAPES) ×2 IMPLANT
DERMABOND ADVANCED (GAUZE/BANDAGES/DRESSINGS) ×1
DERMABOND ADVANCED .7 DNX12 (GAUZE/BANDAGES/DRESSINGS) ×1 IMPLANT
ELECT REM PT RETURN 9FT ADLT (ELECTROSURGICAL) ×2
ELECTRODE REM PT RTRN 9FT ADLT (ELECTROSURGICAL) ×1 IMPLANT
GLOVE BIO SURGEON STRL SZ 6.5 (GLOVE) ×2 IMPLANT
GLOVE BIO SURGEON STRL SZ7.5 (GLOVE) ×6 IMPLANT
GLOVE BIOGEL PI IND STRL 6.5 (GLOVE) ×1 IMPLANT
GLOVE BIOGEL PI IND STRL 7.0 (GLOVE) ×1 IMPLANT
GLOVE BIOGEL PI IND STRL 8 (GLOVE) ×2 IMPLANT
GLOVE BIOGEL PI INDICATOR 6.5 (GLOVE) ×1
GLOVE BIOGEL PI INDICATOR 7.0 (GLOVE) ×1
GLOVE BIOGEL PI INDICATOR 8 (GLOVE) ×2
GOWN STRL REUS W/ TWL LRG LVL3 (GOWN DISPOSABLE) ×1 IMPLANT
GOWN STRL REUS W/ TWL XL LVL3 (GOWN DISPOSABLE) ×3 IMPLANT
GOWN STRL REUS W/TWL LRG LVL3 (GOWN DISPOSABLE) ×1
GOWN STRL REUS W/TWL XL LVL3 (GOWN DISPOSABLE) ×3
HEMOSTAT SPONGE AVITENE ULTRA (HEMOSTASIS) IMPLANT
KIT BASIN OR (CUSTOM PROCEDURE TRAY) ×2 IMPLANT
KIT TURNOVER KIT B (KITS) ×2 IMPLANT
NS IRRIG 1000ML POUR BTL (IV SOLUTION) ×2 IMPLANT
PACK CV ACCESS (CUSTOM PROCEDURE TRAY) ×2 IMPLANT
PAD ARMBOARD 7.5X6 YLW CONV (MISCELLANEOUS) ×4 IMPLANT
SUT MNCRL AB 4-0 PS2 18 (SUTURE) ×2 IMPLANT
SUT PROLENE 6 0 BV (SUTURE) ×4 IMPLANT
SUT PROLENE 7 0 BV 1 (SUTURE) IMPLANT
SUT VIC AB 3-0 SH 27 (SUTURE) ×1
SUT VIC AB 3-0 SH 27X BRD (SUTURE) ×1 IMPLANT
TOWEL GREEN STERILE (TOWEL DISPOSABLE) ×2 IMPLANT
UNDERPAD 30X30 (UNDERPADS AND DIAPERS) ×2 IMPLANT
WATER STERILE IRR 1000ML POUR (IV SOLUTION) IMPLANT

## 2018-09-06 NOTE — Anesthesia Postprocedure Evaluation (Signed)
Anesthesia Post Note  Patient: [name]  Procedure(s) Performed: LEFT ARM ARTERIOVENOUS (AV) FISTULA CREATION (Left Arm Upper)     Patient location during evaluation: PACU Anesthesia Type: MAC Level of consciousness: awake and alert Pain management: pain level controlled Vital Signs Assessment: post-procedure vital signs reviewed and stable Respiratory status: spontaneous breathing, nonlabored ventilation, respiratory function stable and patient connected to nasal cannula oxygen Cardiovascular status: stable and blood pressure returned to baseline Postop Assessment: no apparent nausea or vomiting Anesthetic complications: no    Last Vitals:  Vitals:   09/06/18 0914 09/06/18 0922  BP: (!) 120/55 (!) 127/58  Pulse: 65 73  Resp: (!) 23 20  Temp:    SpO2: 98% 99%    Last Pain:  Vitals:   09/06/18 0922  TempSrc:   PainSc: 2                  Billijo Dilling DANIEL

## 2018-09-06 NOTE — Progress Notes (Signed)
AVS discharge instructions reviewed with husband. He verbalized understanding.

## 2018-09-06 NOTE — Anesthesia Postprocedure Evaluation (Signed)
Anesthesia Post Note  Patient: [name]  Procedure(s) Performed: LEFT ARM ARTERIOVENOUS (AV) FISTULA CREATION (Left Arm Upper)     Anesthesia Post Evaluation  Last Vitals:  Vitals:   09/06/18 0638  BP: (!) 166/52  Pulse: 64  Resp: 16  Temp: 36.6 C  SpO2: 100%    Last Pain:  Vitals:   09/06/18 0638  TempSrc: Oral  PainSc:                  [name],[name]

## 2018-09-06 NOTE — Anesthesia Procedure Notes (Signed)
Procedure Name: MAC Date/Time: 09/06/2018 7:44 AM Performed by: Neldon Newport, CRNA Pre-anesthesia Checklist: Timeout performed, Patient being monitored, Suction available and Patient identified Patient Re-evaluated:Patient Re-evaluated prior to induction Oxygen Delivery Method: Simple face mask

## 2018-09-06 NOTE — Discharge Instructions (Signed)
° °  [facility] ° °Discharge Instructions ° °AV Fistula or Graft Surgery for Dialysis Access ° °Please refer to the following instructions for your post-procedure care. Your surgeon or physician assistant will discuss any changes with you. ° °Activity ° °You may drive the day following your surgery, if you are comfortable and no longer taking prescription pain medication. Resume full activity as the soreness in your incision resolves. ° °Bathing/Showering ° °You may shower after you go home. Keep your incision dry for 48 hours. Do not soak in a bathtub, hot tub, or swim until the incision heals completely. You may not shower if you have a hemodialysis catheter. ° °Incision Care ° °Clean your incision with mild soap and water after 48 hours. Pat the area dry with a clean towel. You do not need a bandage unless otherwise instructed. Do not apply any ointments or creams to your incision. You may have skin glue on your incision. Do not peel it off. It will come off on its own in about one week. Your arm may swell a bit after surgery. To reduce swelling use pillows to elevate your arm so it is above your heart. Your doctor will tell you if you need to lightly wrap your arm with an ACE bandage. ° °Diet ° °Resume your normal diet. There are not special food restrictions following this procedure. In order to heal from your surgery, it is CRITICAL to get adequate nutrition. Your body requires vitamins, minerals, and protein. Vegetables are the best source of vitamins and minerals. Vegetables also provide the perfect balance of protein. Processed food has little nutritional value, so try to avoid this. ° °Medications ° °Resume taking all of your medications. If your incision is causing pain, you may take over-the counter pain relievers such as acetaminophen (Tylenol). If you were prescribed a stronger pain medication, please be aware these medications can cause nausea and constipation. Prevent  nausea by taking the medication with a snack or meal. Avoid constipation by drinking plenty of fluids and eating foods with high amount of fiber, such as fruits, vegetables, and grains. Do not take Tylenol if you are taking prescription pain medications. ° ° ° ° °Follow up °Your surgeon may want to see you in the office following your access surgery. If so, this will be arranged at the time of your surgery. ° °Please call us immediately for any of the following conditions: ° °Increased pain, redness, drainage (pus) from your incision site °Fever of 101 degrees or higher °Severe or worsening pain at your incision site °Hand pain or numbness. ° °Reduce your risk of vascular disease: ° °Stop smoking. If you would like help, call [facility] at 1-800-QUIT-NOW (1-800-784-8669) or [place] at 336-586-4000 ° °Manage your cholesterol °Maintain a desired weight °Control your diabetes °Keep your blood pressure down ° °Dialysis ° °It will take several weeks to several months for your new dialysis access to be ready for use. Your surgeon will determine when it is OK to use it. Your nephrologist will continue to direct your dialysis. You can continue to use your Permcath until your new access is ready for use. ° °If you have any questions, please call the office at [phone]. ° °

## 2018-09-06 NOTE — Telephone Encounter (Signed)
sch appt spk pt mld ltr 10/16/2018 11am Dialysis duplex 1145am p/o MD

## 2018-09-06 NOTE — Telephone Encounter (Signed)
-----   Message from Marty Heck, MD sent at 09/06/2018  8:33 AM EST ----- OPERATIVE NOTE   PROCEDURE: 1. left first stage basilic vein transposition (brachiobasilic arteriovenous fistula) placement  PRE-OPERATIVE DIAGNOSIS: ESRD  POST-OPERATIVE DIAGNOSIS: same  SURGEON: Marty Heck, MD  ASSISTANT(S): Arlee Muslim, PA  #She can follow-up with me in one month with left arm fistula duplex.  Thanks,  Gerald Stabs

## 2018-09-06 NOTE — Transfer of Care (Signed)
Immediate Anesthesia Transfer of Care Note  Patient: [name]  Procedure(s) Performed: LEFT ARM ARTERIOVENOUS (AV) FISTULA CREATION (Left Arm Upper)  Patient Location: PACU  Anesthesia Type:MAC  Level of Consciousness: awake, alert  and oriented  Airway & Oxygen Therapy: Patient Spontanous Breathing and Patient connected to nasal cannula oxygen  Post-op Assessment: Report given to RN, Post -op Vital signs reviewed and stable and Patient moving all extremities X 4  Post vital signs: Reviewed and stable  Last Vitals:  Vitals Value Taken Time  BP 125/50 09/06/2018  8:43 AM  Temp    Pulse 71 09/06/2018  8:43 AM  Resp 17 09/06/2018  8:43 AM  SpO2 98 % 09/06/2018  8:43 AM  Vitals shown include unvalidated device data.  Last Pain:  Vitals:   09/06/18 0638  TempSrc: Oral  PainSc:       Patients Stated Pain Goal: 2 (35/36/14 4315)  Complications: No apparent anesthesia complications

## 2018-09-06 NOTE — H&P (Signed)
History and Physical Interval Note:  09/06/2018 7:14 AM  [name]  has presented today for surgery, with the diagnosis of END STAGE RENAL DISEASE  The various methods of treatment have been discussed with the patient and family. After consideration of risks, benefits and other options for treatment, the patient has consented to  Procedure(s): LEFT ARM ARTERIOVENOUS (AV) FISTULA CREATION (Left) as a surgical intervention .  The patient's history has been reviewed, patient examined, no change in status, stable for surgery.  I have reviewed the patient's chart and labs.  Questions were answered to the patient's satisfaction.     Left arm AVF.  Basilic vein in two stages.  [name]  Patient name: [name] MRN: [id number]        DOB: [birth year]          Sex: female  REASON FOR CONSULT: New AV fistula access  HPI: [name] is a 78 y.o. female, with end-stage renal disease currently on hemodialysis via right IJ tunneled catheter, coronary artery disease status post CABG, heart failure, aortic stenosis presents for new dialysis access.  Patient previously underwent a left radiocephalic fistula with subsequent revision last on 07/02/2015 by Dr. Kellie Simmering.  Ultimately she states that the fistula was placed several years before she started dialysis and she only used it one time before thrombosed.  She is right-hand dominant and has a right IJ tunnel catheter at this time.  She is retired and lives at home.  Does not care which arm is used for access.      Past Medical History:  Diagnosis Date  . Anemia    Pt is taking iron.   [name] Anxiety   . Arthritis   . Carotid stenosis    40-59% bilateral ICA stenosis in 2/12.  . Chronic low back pain   . CKD (chronic kidney disease)    Dr. Audie Clear at [facility]  . Coronary artery disease    Pt presented 2/10 to [facility] with NSTEMI and diastolic CHF exacerbation.  LHC was done  3/10 showing 99% pRCA stenosis and  80% calcified pLAD stenosis with L=>R collaterals.  Pt was referred  for CABG which was done by Dr. Prescott Gum with LIMA-LAD, SVG-RCA, SVG-OM.  . Diabetes mellitus   . Diabetic neuropathy ([place])   . Diastolic CHF (HCC)    Echo (2/10) showed EF 55-65%, mild LVH, diastolic dysfunction, mild AS with mean gradient 12 mmHg, PASP 43 mmHg.  Echo (2/12): EF 55-60%, mild LVH, mild AS (mean gradient 12), PA systolic pressure 32 mmHg.     [name] GERD (gastroesophageal reflux disease)   . Heart murmur   . Hyperlipidemia   . Hypertension   . Mild aortic stenosis    mean gradient 12 mmHg in 2/12.  . Myocardial infarction ([place])    "mild"  . Pneumonia   . PONV (postoperative nausea and vomiting)   . Thrombocytopenia ([place])   . Unsteady gait          Past Surgical History:  Procedure Laterality Date  . AV FISTULA PLACEMENT Left 01/30/2015   Procedure: Creation of a Radial Cephalic AV Fistula left wrist;  Surgeon: [name], MD;  Location: [place];  Service: Vascular;  Laterality: Left;  . BACK SURGERY     multiple  . BREAST SURGERY     biopsy  . CARDIAC CATHETERIZATION    . CATARACT EXTRACTION W/ INTRAOCULAR LENS  IMPLANT, BILATERAL    . COLONOSCOPY  Left 11/03/2016   Procedure: COLONOSCOPY;  Surgeon: [name], MD;  Location: [facility] ENDOSCOPY;  Service: Endoscopy;  Laterality: Left;  . COLONOSCOPY W/ BIOPSIES AND POLYPECTOMY    . CORONARY ARTERY BYPASS GRAFT  09/2008   pt with NSTEMI and diastolic CHF exacerbation.  LHC was done  3/10 showing 99% pRCA stenosis and 80% calcified pLAD stenosis with L=>R collaterals.  Pt was referred  for CABG which was done by Dr. Prescott Gum with LIMA-LAD, SVG-RCA, SVG-OM.  [name] ESOPHAGOGASTRODUODENOSCOPY N/A 11/02/2016   Procedure: ESOPHAGOGASTRODUODENOSCOPY (EGD);  Surgeon: [name], MD;  Location: [facility] ENDOSCOPY;  Service: Endoscopy;  Laterality: N/A;  . GIVENS CAPSULE STUDY N/A 11/29/2016   Procedure: GIVENS CAPSULE STUDY;  Surgeon:  [name], MD;  Location: [place];  Service: Endoscopy;  Laterality: N/A;  . REVISON OF ARTERIOVENOUS FISTULA Left 07/02/2015   Procedure: REVISON OF LEFT RADIOCEPHALIC ARTERIOVENOUS FISTULA;  Surgeon: [name], MD;  Location: [place];  Service: Vascular;  Laterality: Left;  . RIGHT/LEFT HEART CATH AND CORONARY/GRAFT ANGIOGRAPHY N/A 05/18/2018   Procedure: RIGHT/LEFT HEART CATH AND CORONARY/GRAFT ANGIOGRAPHY;  Surgeon: Leonie Man, MD;  Location: [place] CV LAB;  Service: Cardiovascular;  Laterality: N/A;    Family History  Adopted: Yes  Family history unknown: Yes    SOCIAL HISTORY: Social History        Socioeconomic History  . Marital status: Married    Spouse name: Not on file  . Number of children: Not on file  . Years of education: Not on file  . Highest education level: Not on file  Occupational History  . Not on file  Social Needs  . Financial resource strain: Not on file  . Food insecurity:    Worry: Not on file    Inability: Not on file  . Transportation needs:    Medical: Not on file    Non-medical: Not on file  Tobacco Use  . Smoking status: Former Smoker    Types: Cigarettes  . Smokeless tobacco: Never Used  . Tobacco comment: quit 1988  Substance and Sexual Activity  . Alcohol use: Yes    Comment: beer daily  . Drug use: No  . Sexual activity: Never    Partners: Male  Lifestyle  . Physical activity:    Days per week: Not on file    Minutes per session: Not on file  . Stress: Not on file  Relationships  . Social connections:    Talks on phone: Not on file    Gets together: Not on file    Attends religious service: Not on file    Active member of club or organization: Not on file    Attends meetings of clubs or organizations: Not on file    Relationship status: Not on file  . Intimate partner violence:    Fear of current or ex partner: Not on file    Emotionally abused: Not on file     Physically abused: Not on file    Forced sexual activity: Not on file  Other Topics Concern  . Not on file  Social History Narrative  . Not on file         Allergies  Allergen Reactions  . Doxycycline Nausea And Vomiting    Caused "DEATHLY NAUSEA AND VOMITING"  . Lipitor [Atorvastatin] Other (See Comments)    Stomach pain  . Strawberry Extract Rash          Current Outpatient Medications  Medication Sig Dispense Refill  .  ALPRAZolam (XANAX) 1 MG tablet Take 1 mg by mouth at bedtime.     . AMITIZA 24 MCG capsule Take 24 mcg by mouth See admin instructions. Take 24 mcg by mouth two times a day, every other day    . b complex-vitamin c-folic acid (NEPHRO-VITE) 0.8 MG TABS tablet Take 1 tablet by mouth daily.  3  . calcitRIOL (ROCALTROL) 0.5 MCG capsule Take 0.5 mcg by mouth daily.  1  . folic acid (FOLVITE) 1 MG tablet Take 1 tablet (1 mg total) by mouth daily. 30 tablet 0  . midodrine (PROAMATINE) 10 MG tablet Take 1 tablet (10 mg total) by mouth 2 (two) times daily with a meal. 60 tablet 0  . Nutritional Supplements (FEEDING SUPPLEMENT, NEPRO CARB STEADY,) LIQD Take 237 mLs by mouth 2 (two) times daily between meals. 30 Can 0  . rosuvastatin (CRESTOR) 5 MG tablet Take 1 tablet (5 mg total) by mouth at bedtime. 90 tablet 3  . sevelamer carbonate (RENVELA) 800 MG tablet Take 1,600 mg 3 (three) times daily with meals by mouth.     . trimethoprim (TRIMPEX) 100 MG tablet Take 50 mg by mouth daily.     . vitamin B-12 (CYANOCOBALAMIN) 500 MCG tablet Take 1 tablet (500 mcg total) by mouth daily. 30 tablet 0   No current facility-administered medications for this visit.     REVIEW OF SYSTEMS:  [X]  denotes positive finding, [ ]  denotes negative finding Cardiac  Comments:  Chest pain or chest pressure:    Shortness of breath upon exertion:    Short of breath when lying flat:    Irregular heart rhythm:        Vascular    Pain in calf, thigh, or  hip brought on by ambulation:    Pain in feet at night that wakes you up from your sleep:     Blood clot in your veins:    Leg swelling:         Pulmonary    Oxygen at home:    Productive cough:     Wheezing:         Neurologic    Sudden weakness in arms or legs:     Sudden numbness in arms or legs:     Sudden onset of difficulty speaking or slurred speech:    Temporary loss of vision in one eye:     Problems with dizziness:         Gastrointestinal    Blood in stool:     Vomited blood:         Genitourinary    Burning when urinating:     Blood in urine:        Psychiatric    Major depression:         Hematologic    Bleeding problems:    Problems with blood clotting too easily:        Skin    Rashes or ulcers:        Constitutional    Fever or chills:      PHYSICAL EXAM:    Vitals:   08/28/18 1208  BP: (!) 145/73  Pulse: 87  Resp: 16  SpO2: 99%  Weight: 108 lb (49 kg)  Height: 5\' 8"  (1.727 m)    GENERAL: The patient is a well-nourished female, in no acute distress. The vital signs are documented above. CARDIAC: There is a regular rate and rhythm.  VASCULAR:  2+ radial pulse palpable bilateral upper extremity  2+ brachial pulse palpable bilateral upper extremity No upper extremity tissue loss RIJ tunneled catheter PULMONARY: There is good air exchange bilaterally without wheezing or rales. ABDOMEN: Soft and non-tender with normal pitched bowel sounds.  MUSCULOSKELETAL: There are no major deformities or cyanosis. NEUROLOGIC: No focal weakness or paresthesias are detected.  DATA:   I reviewed her vein mapping which shows that the cephalic vein in her left arm that was previously used for radiocephalic fistula has several small segments around 2.1 mm in the upper arm.  She does have a nice basilic vein in the left arm.  Assessment/Plan:  Discussed with Mrs. Fennema  options moving forward given her vein mapping.  She has a right IJ tunnel catheter that has been in place for about 4 months and she is right-hand dominant.  As result I have recommended left AV fistula placement.  Her previous right radiocephalic fistula has been thrombosed for some time.  She has a nice basilic vein in her left arm and we discussed two-stage operation.  I discussed with her risk and benefits including bleeding, infection, steal, nerve damage, vessel damage, failure to mature, etc.  We will set her up for next available date.   Jeanne Heck, MD [facility] [place] Office: [phone] Pager: [place]

## 2018-09-06 NOTE — Op Note (Signed)
OPERATIVE NOTE   PROCEDURE: 1. left first stage basilic vein transposition (brachiobasilic arteriovenous fistula) placement  PRE-OPERATIVE DIAGNOSIS: ESRD  POST-OPERATIVE DIAGNOSIS: same  SURGEON: Marty Heck, MD  ASSISTANT(S): Arlee Muslim, PA  ANESTHESIA: MAC  ESTIMATED BLOOD LOSS: Minimal  FINDING(S): 1.  Basilic vein: 4 mm, acceptable 2.  Brachial artery: 4 mm, atherosclerotic disease evident 3.  Venous outflow: palpable thrill  4.  Radial flow: dopplerable radial signal  SPECIMEN(S):  none  INDICATIONS:   [name] is a 78 y.o. female who presents with ESRD and need for permanent hemodialysis access.  The patient is scheduled for left first stage basilic vein transposition.  The patient is aware the risks include but are not limited to: bleeding, infection, steal syndrome, nerve damage, ischemic monomelic neuropathy, failure to mature, and need for additional procedures.  The patient is aware of the risks of the procedure and elects to proceed forward.   DESCRIPTION: After full informed written consent was obtained from the patient, the patient was brought back to the operating room and placed supine upon the operating table.  Prior to induction, the patient received IV antibiotics.   After obtaining adequate anesthesia, the patient was then prepped and draped in the standard fashion for a left arm access procedure.  I turned my attention first to identifying the patient's basilic vein and brachial artery.    Using SonoSite guidance, the location of these vessels were marked out on the skin.   At this point, I injected local anesthetic to obtain a field block of the antecubitum.  In total, I injected about 4 mL of 1% lidocaine without epinephrine.  I made a transverse incision at the level of the antecubitum and dissected through the subcutaneous tissue and fascia to gain exposure of the brachial artery.  This was noted to be 4 mm in diameter externally.  This  was dissected out proximally and distally and controlled with vessel loops .  I then dissected out the basilic vein.  This was noted to be 4 mm in diameter externally.  The distal segment of the vein was ligated with a  2-0 silk, and the vein was transected.  The proximal segment was interrogated with serial dilators.  The vein accepted up to a 4 mm dilator without any difficulty.  I then instilled the heparinized saline into the vein and clamped it.  At this point, I reset my exposure of the brachial artery.  The patient was given 3000 units of IV heparin.  I placed the artery under tension proximally and distally.  I made an arteriotomy with a #11 blade, and then I extended the arteriotomy with a Potts scissor.  I injected heparinized saline proximal and distal to this arteriotomy.  The vein was then sewn to the artery in an end-to-side configuration with a running stitch of 6-0 Prolene.  Prior to completing this anastomosis, I allowed the vein and artery to backbleed.  There was no evidence of clot from any vessels.  I completed the anastomosis in the usual fashion and then released all vessel loops and clamps.    There was a palpable thrill in the venous outflow, and there was a dopplerable radial signal.  At this point, I irrigated out the surgical wound.  There was no further active bleeding.  The subcutaneous tissue was reapproximated with a running stitch of 3-0 Vicryl.  The skin was then reapproximated with a running subcuticular stitch of 4-0 Monocryl.  The skin was  then cleaned, dried, and reinforced with Dermabond.  The patient tolerated this procedure well.   COMPLICATIONS: None  CONDITION: Stable  Marty Heck MD [facility] [place] Office: [phone] Pager: [phone]  09/06/2018, 8:30 AM

## 2018-09-07 ENCOUNTER — Encounter (HOSPITAL_COMMUNITY): Payer: Self-pay | Admitting: Vascular Surgery

## 2018-09-11 ENCOUNTER — Telehealth: Payer: Self-pay

## 2018-09-11 ENCOUNTER — Other Ambulatory Visit: Payer: Self-pay

## 2018-09-11 ENCOUNTER — Encounter: Payer: Self-pay | Admitting: Family

## 2018-09-11 ENCOUNTER — Ambulatory Visit (INDEPENDENT_AMBULATORY_CARE_PROVIDER_SITE_OTHER): Payer: Self-pay | Admitting: Physician Assistant

## 2018-09-11 VITALS — BP 138/72 | HR 91 | Temp 98.0°F | Resp 14 | Ht 68.0 in | Wt 108.0 lb

## 2018-09-11 DIAGNOSIS — Z992 Dependence on renal dialysis: Secondary | ICD-10-CM

## 2018-09-11 DIAGNOSIS — N186 End stage renal disease: Secondary | ICD-10-CM

## 2018-09-11 NOTE — Telephone Encounter (Signed)
Returned call to patient. She stated her hand is numb and cold after surgery. Appointment made for today at 3:30 to be seen.

## 2018-09-11 NOTE — Progress Notes (Signed)
POST OPERATIVE OFFICE NOTE       HPI:  This is a 78 y.o. female who is s/p Steal symptoms post left first stage basilic fistula creation 09/06/2018.   She has throbbing pain in the hand and it is numb all the time.  She states she can't use her left hand.  She is on HD via right [facility] M[facility].  Allergies  Allergen Reactions  . Doxycycline Nausea And Vomiting    Caused "DEATHLY NAUSEA AND VOMITING"  . Lipitor [Atorvastatin] Other (See Comments)    Stomach pain  . Strawberry Extract Rash    Current Outpatient Medications  Medication Sig Dispense Refill  . ALPRAZolam (XANAX) 1 MG tablet Take 1 mg by mouth at bedtime.     . AMITIZA 24 MCG capsule Take 24 mcg by mouth 2 (two) times daily as needed for constipation.     [name] b complex-vitamin c-folic acid (NEPHRO-VITE) 0.8 MG TABS tablet Take 1 tablet by mouth daily.  3  . calcitRIOL (ROCALTROL) 0.5 MCG capsule Take 0.5 mcg by mouth daily.  1  . folic acid (FOLVITE) 1 MG tablet Take 1 tablet (1 mg total) by mouth daily. 30 tablet 0  . HYDROcodone-acetaminophen (NORCO) 5-325 MG tablet Take 1 tablet by mouth every 6 (six) hours as needed for moderate pain. 10 tablet 0  . midodrine (PROAMATINE) 10 MG tablet Take 1 tablet (10 mg total) by mouth 2 (two) times daily with a meal. 60 tablet 0  . Nutritional Supplements (FEEDING SUPPLEMENT, NEPRO CARB STEADY,) LIQD Take 237 mLs by mouth 2 (two) times daily between meals. 30 Can 0  . rosuvastatin (CRESTOR) 5 MG tablet Take 1 tablet (5 mg total) by mouth at bedtime. 90 tablet 3  . sevelamer carbonate (RENVELA) 800 MG tablet Take 1,600 mg 3 (three) times daily with meals by mouth.     . trimethoprim (TRIMPEX) 100 MG tablet Take 50 mg by mouth daily.     . vitamin B-12 (CYANOCOBALAMIN) 500 MCG tablet Take 1 tablet (500 mcg total) by mouth daily. 30 tablet 0   No current facility-administered medications for this visit.      ROS:    Physical Exam:  Palpable thrill in anastomosis of left first stage  basilic fistula, incision has healed well.  Left hand weakness with significant decreased sensation.  Doppler radial signal and palmer signal, weak ulnar signal.  No muscle waisting, hand is cooler to touch on the left compared to the right.    Heart RRR Lungs non labored breathing Abdomin soft, NTTP  Vein mapping 08/28/2018  +-----------------+-------------+----------+--------+ Right Cephalic   Diameter (cm)Depth (cm)Findings +-----------------+-------------+----------+--------+ Shoulder             0.32                        +-----------------+-------------+----------+--------+ Prox upper arm       0.35                        +-----------------+-------------+----------+--------+ Mid upper arm        0.23                        +-----------------+-------------+----------+--------+ Dist upper arm       0.28                        +-----------------+-------------+----------+--------+ Antecubital fossa    0.31                        +-----------------+-------------+----------+--------+  Prox forearm         0.27                        +-----------------+-------------+----------+--------+ Mid forearm          0.27                        +-----------------+-------------+----------+--------+ Dist forearm         0.27                        +-----------------+-------------+----------+--------+  +-----------------+-------------+----------+--------+ Right Basilic    Diameter (cm)Depth (cm)Findings +-----------------+-------------+----------+--------+ Prox upper arm       0.60                        +-----------------+-------------+----------+--------+ Mid upper arm        0.37                        +-----------------+-------------+----------+--------+ Dist upper arm       0.30                        +-----------------+-------------+----------+--------+ Antecubital fossa    0.20                         +-----------------+-------------+----------+--------+ Prox forearm         0.26                        +-----------------+-------------+----------+--------+    Assessment/Plan:  This is a 78 y.o. 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   After speaking to Dr. Carlis Abbott he states that she has small arteries and the vein was 4 mm.  She has an acceptable right basilic vein, but the artery is likely small on the right as well.  We will have to decide what her next option is.  She may need an UE loop graft using the brachial artery.     Roxy Horseman  PA-C [facility] [phone]  Clinic MD:  Carlis Abbott

## 2018-09-12 ENCOUNTER — Other Ambulatory Visit: Payer: Self-pay | Admitting: *Deleted

## 2018-09-12 ENCOUNTER — Encounter (HOSPITAL_COMMUNITY): Payer: Self-pay | Admitting: *Deleted

## 2018-09-12 ENCOUNTER — Other Ambulatory Visit: Payer: Self-pay

## 2018-09-12 ENCOUNTER — Encounter: Payer: Self-pay | Admitting: Family

## 2018-09-12 NOTE — Progress Notes (Signed)
Pt denies SOB and chest pain. Pt stated that she is under the care of L. [name], NP, Cardiology. Pt stated that she does not check her blood glucose since she is no longer being treated for diabetes. Pt made aware to stop taking vitamins, fish oil and herbal medications. Do not take any NSAIDs ie: Ibuprofen, Advil, Naproxen (Aleve), Motrin, BC and Goody Powder. Pt verbalized understanding of all pre-op instructions. See previous note from Karoline Caldwell, [place], Anesthesiology.

## 2018-09-13 ENCOUNTER — Encounter (HOSPITAL_COMMUNITY)
Admission: RE | Disposition: A | Discharge status: Home / Self Care | Payer: Self-pay | Source: Home / Self Care | Attending: Vascular Surgery

## 2018-09-13 ENCOUNTER — Ambulatory Visit (HOSPITAL_COMMUNITY): Payer: Medicare HMO | Admitting: Certified Registered"

## 2018-09-13 ENCOUNTER — Ambulatory Visit (HOSPITAL_COMMUNITY)
Admission: RE | Admit: 2018-09-13 | Discharge: 2018-09-13 | Disposition: A | Discharge status: Home / Self Care | Payer: Medicare HMO | Attending: Vascular Surgery | Admitting: Vascular Surgery

## 2018-09-13 ENCOUNTER — Encounter (HOSPITAL_COMMUNITY): Payer: Self-pay

## 2018-09-13 DIAGNOSIS — Z951 Presence of aortocoronary bypass graft: Secondary | ICD-10-CM | POA: Insufficient documentation

## 2018-09-13 DIAGNOSIS — Y832 Surgical operation with anastomosis, bypass or graft as the cause of abnormal reaction of the patient, or of later complication, without mention of misadventure at the time of the procedure: Secondary | ICD-10-CM | POA: Insufficient documentation

## 2018-09-13 DIAGNOSIS — N186 End stage renal disease: Secondary | ICD-10-CM | POA: Insufficient documentation

## 2018-09-13 DIAGNOSIS — E785 Hyperlipidemia, unspecified: Secondary | ICD-10-CM | POA: Insufficient documentation

## 2018-09-13 DIAGNOSIS — Z79899 Other long term (current) drug therapy: Secondary | ICD-10-CM | POA: Insufficient documentation

## 2018-09-13 DIAGNOSIS — E1151 Type 2 diabetes mellitus with diabetic peripheral angiopathy without gangrene: Secondary | ICD-10-CM | POA: Insufficient documentation

## 2018-09-13 DIAGNOSIS — I252 Old myocardial infarction: Secondary | ICD-10-CM | POA: Insufficient documentation

## 2018-09-13 DIAGNOSIS — D631 Anemia in chronic kidney disease: Secondary | ICD-10-CM | POA: Insufficient documentation

## 2018-09-13 DIAGNOSIS — Z8673 Personal history of transient ischemic attack (TIA), and cerebral infarction without residual deficits: Secondary | ICD-10-CM | POA: Diagnosis not present

## 2018-09-13 DIAGNOSIS — R69 Illness, unspecified: Secondary | ICD-10-CM | POA: Diagnosis not present

## 2018-09-13 DIAGNOSIS — I5043 Acute on chronic combined systolic (congestive) and diastolic (congestive) heart failure: Secondary | ICD-10-CM | POA: Diagnosis not present

## 2018-09-13 DIAGNOSIS — T82898A Other specified complication of vascular prosthetic devices, implants and grafts, initial encounter: Secondary | ICD-10-CM | POA: Diagnosis not present

## 2018-09-13 DIAGNOSIS — I509 Heart failure, unspecified: Secondary | ICD-10-CM | POA: Insufficient documentation

## 2018-09-13 DIAGNOSIS — E1122 Type 2 diabetes mellitus with diabetic chronic kidney disease: Secondary | ICD-10-CM | POA: Diagnosis not present

## 2018-09-13 DIAGNOSIS — I132 Hypertensive heart and chronic kidney disease with heart failure and with stage 5 chronic kidney disease, or end stage renal disease: Secondary | ICD-10-CM | POA: Diagnosis not present

## 2018-09-13 DIAGNOSIS — F419 Anxiety disorder, unspecified: Secondary | ICD-10-CM | POA: Insufficient documentation

## 2018-09-13 DIAGNOSIS — Z992 Dependence on renal dialysis: Secondary | ICD-10-CM | POA: Diagnosis not present

## 2018-09-13 DIAGNOSIS — I2510 Atherosclerotic heart disease of native coronary artery without angina pectoris: Secondary | ICD-10-CM | POA: Insufficient documentation

## 2018-09-13 DIAGNOSIS — Z87891 Personal history of nicotine dependence: Secondary | ICD-10-CM | POA: Diagnosis not present

## 2018-09-13 HISTORY — PX: LIGATION OF ARTERIOVENOUS  FISTULA: SHX5948

## 2018-09-13 LAB — POCT I-STAT 4, (NA,K, GLUC, HGB,HCT)
GLUCOSE: 132 mg/dL — AB (ref 70–99)
HCT: 29 % — ABNORMAL LOW (ref 36.0–46.0)
Hemoglobin: 9.9 g/dL — ABNORMAL LOW (ref 12.0–15.0)
Potassium: 4.6 mmol/L (ref 3.5–5.1)
SODIUM: 138 mmol/L (ref 135–145)

## 2018-09-13 LAB — GLUCOSE, CAPILLARY: Glucose-Capillary: 102 mg/dL — ABNORMAL HIGH (ref 70–99)

## 2018-09-13 SURGERY — LIGATION OF ARTERIOVENOUS  FISTULA
Anesthesia: Monitor Anesthesia Care | Site: Arm Upper | Laterality: Left

## 2018-09-13 MED ORDER — ACETAMINOPHEN 325 MG PO TABS
650.0000 mg | ORAL_TABLET | Freq: Once | ORAL | Status: AC
  Administered 2018-09-13: 650 mg via ORAL

## 2018-09-13 MED ORDER — CEFAZOLIN SODIUM-DEXTROSE 2-4 GM/100ML-% IV SOLN
2.0000 g | INTRAVENOUS | Status: AC
  Administered 2018-09-13: 2 g via INTRAVENOUS

## 2018-09-13 MED ORDER — HYDROCODONE-ACETAMINOPHEN 5-325 MG PO TABS: 1.0000 | ORAL_TABLET | Freq: Four times a day (QID) | ORAL | 0 refills | Status: DC | PRN

## 2018-09-13 MED ORDER — ACETAMINOPHEN 325 MG PO TABS
ORAL_TABLET | ORAL | Status: AC
  Filled 2018-09-13: qty 2

## 2018-09-13 MED ORDER — LIDOCAINE HCL (CARDIAC) PF 100 MG/5ML IV SOSY
PREFILLED_SYRINGE | INTRAVENOUS | Status: DC | PRN
  Administered 2018-09-13: 40 mg via INTRATRACHEAL

## 2018-09-13 MED ORDER — SODIUM CHLORIDE 0.9 % IV SOLN
INTRAVENOUS | Status: DC | PRN
  Administered 2018-09-13: 13:00:00 via INTRAVENOUS

## 2018-09-13 MED ORDER — LIDOCAINE HCL (PF) 1 % IJ SOLN
INTRAMUSCULAR | Status: AC
  Filled 2018-09-13: qty 30

## 2018-09-13 MED ORDER — LIDOCAINE HCL 1 % IJ SOLN
INTRAMUSCULAR | Status: DC | PRN
  Administered 2018-09-13: 7 mL

## 2018-09-13 MED ORDER — 0.9 % SODIUM CHLORIDE (POUR BTL) OPTIME
TOPICAL | Status: DC | PRN
  Administered 2018-09-13: 1000 mL

## 2018-09-13 MED ORDER — FENTANYL CITRATE (PF) 250 MCG/5ML IJ SOLN
INTRAMUSCULAR | Status: AC
  Filled 2018-09-13: qty 5

## 2018-09-13 MED ORDER — FENTANYL CITRATE (PF) 100 MCG/2ML IJ SOLN: 25.0000 ug | INTRAMUSCULAR | Status: DC | PRN

## 2018-09-13 MED ORDER — ONDANSETRON HCL 4 MG/2ML IJ SOLN: 4.0000 mg | Freq: Once | INTRAMUSCULAR | Status: DC | PRN

## 2018-09-13 MED ORDER — CEFAZOLIN SODIUM-DEXTROSE 2-4 GM/100ML-% IV SOLN
INTRAVENOUS | Status: AC
  Filled 2018-09-13: qty 100

## 2018-09-13 MED ORDER — SODIUM CHLORIDE 0.9 % IV SOLN: INTRAVENOUS | Status: DC

## 2018-09-13 MED ORDER — FENTANYL CITRATE (PF) 100 MCG/2ML IJ SOLN
INTRAMUSCULAR | Status: DC | PRN
  Administered 2018-09-13: 50 ug via INTRAVENOUS

## 2018-09-13 MED ORDER — PROPOFOL 500 MG/50ML IV EMUL
INTRAVENOUS | Status: DC | PRN
  Administered 2018-09-13: 50 ug/kg/min via INTRAVENOUS

## 2018-09-13 SURGICAL SUPPLY — 27 items
CANISTER SUCT 3000ML PPV (MISCELLANEOUS) ×2 IMPLANT
CLIP VESOCCLUDE MED 6/CT (CLIP) ×2 IMPLANT
CLIP VESOCCLUDE SM WIDE 6/CT (CLIP) ×2 IMPLANT
COVER WAND RF STERILE (DRAPES) ×2 IMPLANT
DERMABOND ADVANCED (GAUZE/BANDAGES/DRESSINGS) ×1
DERMABOND ADVANCED .7 DNX12 (GAUZE/BANDAGES/DRESSINGS) ×1 IMPLANT
ELECT REM PT RETURN 9FT ADLT (ELECTROSURGICAL) ×2
ELECTRODE REM PT RTRN 9FT ADLT (ELECTROSURGICAL) ×1 IMPLANT
GLOVE BIO SURGEON STRL SZ7.5 (GLOVE) ×2 IMPLANT
GOWN STRL REUS W/ TWL LRG LVL3 (GOWN DISPOSABLE) ×2 IMPLANT
GOWN STRL REUS W/ TWL XL LVL3 (GOWN DISPOSABLE) ×1 IMPLANT
GOWN STRL REUS W/TWL LRG LVL3 (GOWN DISPOSABLE) ×2
GOWN STRL REUS W/TWL XL LVL3 (GOWN DISPOSABLE) ×1
KIT BASIN OR (CUSTOM PROCEDURE TRAY) ×2 IMPLANT
KIT TURNOVER KIT B (KITS) ×2 IMPLANT
NS IRRIG 1000ML POUR BTL (IV SOLUTION) ×2 IMPLANT
PACK CV ACCESS (CUSTOM PROCEDURE TRAY) ×2 IMPLANT
PAD ARMBOARD 7.5X6 YLW CONV (MISCELLANEOUS) ×4 IMPLANT
SUT ETHILON 3 0 PS 1 (SUTURE) IMPLANT
SUT MNCRL AB 4-0 PS2 18 (SUTURE) ×2 IMPLANT
SUT PROLENE 6 0 BV (SUTURE) IMPLANT
SUT SILK 0 TIES 10X30 (SUTURE) ×2 IMPLANT
SUT VIC AB 3-0 SH 27 (SUTURE) ×1
SUT VIC AB 3-0 SH 27X BRD (SUTURE) ×1 IMPLANT
TOWEL GREEN STERILE (TOWEL DISPOSABLE) ×2 IMPLANT
UNDERPAD 30X30 (UNDERPADS AND DIAPERS) ×2 IMPLANT
WATER STERILE IRR 1000ML POUR (IV SOLUTION) ×2 IMPLANT

## 2018-09-13 NOTE — H&P (Signed)
   History and Physical Update  The patient was interviewed and re-examined.  The patient's previous History and Physical has been reviewed and is unchanged from recent office visit.  Plan will be to ligate left arm AV fistula.  Further access to be planned after wound healing of left arm.  Pattie Flaharty C. Donzetta Matters, MD [facility] [place] Office: [phone] Pager: [phone]   09/13/2018, 11:45 AM

## 2018-09-13 NOTE — Anesthesia Postprocedure Evaluation (Signed)
Anesthesia Post Note  Patient: [name]  Procedure(s) Performed: LIGATION OF LEFT ARTERIOVENOUS  FISTULA (Left Arm Upper)     Patient location during evaluation: PACU Anesthesia Type: MAC Level of consciousness: awake and alert Pain management: pain level controlled Vital Signs Assessment: post-procedure vital signs reviewed and stable Respiratory status: spontaneous breathing, nonlabored ventilation, respiratory function stable and patient connected to nasal cannula oxygen Cardiovascular status: stable and blood pressure returned to baseline Postop Assessment: no apparent nausea or vomiting Anesthetic complications: no    Last Vitals:  Vitals:   09/13/18 1356 09/13/18 1409  BP: 120/66 127/65  Pulse: 75 76  Resp: 13 18  Temp: (!) 36.4 C   SpO2: 97% 95%    Last Pain:  Vitals:   09/13/18 1409  TempSrc:   PainSc: 4                  Ryan P Ellender

## 2018-09-13 NOTE — Anesthesia Preprocedure Evaluation (Signed)
Anesthesia Evaluation  Patient identified by MRN, date of birth, ID band Patient awake    Reviewed: Allergy & Precautions, NPO status , Patient's Chart, lab work & pertinent test results  History of Anesthesia Complications (+) PONV and history of anesthetic complications  Airway Mallampati: III  TM Distance: >3 FB Neck ROM: Full    Dental no notable dental hx.    Pulmonary neg pulmonary ROS, former smoker,    Pulmonary exam normal breath sounds clear to auscultation       Cardiovascular hypertension, + CAD, + Past MI, + CABG, + Peripheral Vascular Disease and +CHF  Normal cardiovascular exam+ dysrhythmias + Valvular Problems/Murmurs AS  Rhythm:Regular Rate:Normal  ECG: rate 97  ECHO: LV EF: 20% -   25%   Neuro/Psych Anxiety CVA, No Residual Symptoms    GI/Hepatic Neg liver ROS, GERD  ,  Endo/Other  diabetes  Renal/GU ESRF and DialysisRenal disease     Musculoskeletal negative musculoskeletal ROS (+)   Abdominal   Peds  Hematology  (+) anemia , HLD   Anesthesia Other Findings POOR FLOW IN FISTULA  Reproductive/Obstetrics                            Anesthesia Physical Anesthesia Plan  ASA: IV  Anesthesia Plan: MAC   Post-op Pain Management:    Induction: Intravenous  PONV Risk Score and Plan: 3 and Propofol infusion, Ondansetron and Treatment may vary due to age or medical condition  Airway Management Planned: Simple Face Mask  Additional Equipment:   Intra-op Plan:   Post-operative Plan:   Informed Consent: I have reviewed the patients History and Physical, chart, labs and discussed the procedure including the risks, benefits and alternatives for the proposed anesthesia with the patient or authorized representative who has indicated his/her understanding and acceptance.     Dental advisory given  Plan Discussed with: CRNA  Anesthesia Plan Comments:          Anesthesia Quick Evaluation

## 2018-09-13 NOTE — Transfer of Care (Signed)
Immediate Anesthesia Transfer of Care Note  Patient: [name]  Procedure(s) Performed: LIGATION OF LEFT ARTERIOVENOUS  FISTULA (Left Arm Upper)  Patient Location: PACU  Anesthesia Type:MAC  Level of Consciousness: awake, alert  and oriented  Airway & Oxygen Therapy: Patient Spontanous Breathing  Post-op Assessment: Report given to RN, Post -op Vital signs reviewed and stable and Patient moving all extremities X 4  Post vital signs: Reviewed and stable  Last Vitals:  Vitals Value Taken Time  BP    Temp    Pulse    Resp    SpO2      Last Pain:  Vitals:   09/13/18 0950  TempSrc:   PainSc: 6       Patients Stated Pain Goal: 2 (79/39/03 0092)  Complications: No apparent anesthesia complications

## 2018-09-13 NOTE — Op Note (Signed)
    Patient name: [name] MRN: [id number] DOB: 05-Jun-1941 Sex: female  09/13/2018 Pre-operative Diagnosis: End-stage renal disease, left arm steal Post-operative diagnosis:  Same Surgeon:  Eda Paschal. Donzetta Matters, MD Assistant: Arlee Muslim, PA Procedure Performed: Ligation left arm AV fistula  Indications: 78 year old female recently underwent placement of left arm AV fistula for permanent dialysis access.  Fistula has remained patent unfortunately patient has developed numbness of her hands states that she is unable to use it.  She has very minimal signal in the radial and ulnar arteries at the wrist she is indicated for ligation of her AV fistula.  Findings: There is minimal signal of the ulnar and radial artery at the wrist.  After clamping the fistula I did have a multiphasic ulnar signal.  Fistula was doubly ligated and near the anastomosis was oversewn with 6-0 Prolene suture in a running mattress fashion.   Procedure:  The patient was identified in the holding area and taken to the operating room where she was put supine operative table and MAC anesthesia induced.  She sterilely prepped draped left upper extremity usual fashion antibiotics were administered and a timeout was called.  I began by instilling 1% lidocaine with epinephrine through the previous incision.  Previous incision was then opened where there is minimal seroma.  Identified the fistula as well as the artery.  The fistula had a strong thrill was quite large measuring approximately 6 to 7 mm external diameter and the artery was quite small measuring 2 to 3 mm in diameter.  I checked for signals at the wrist that could not find any.  I then clamped what I thought was clearly the fistula I did develop a biphasic signal in the ulnar artery at the wrist.  I then clamped the fistula distally transected.  Distally I tied off with 2-0 silk suture and proximally I oversewed with a running 6-0 Prolene suture in a mattress fashion.  I then  irrigated the wound and obtain hemostasis and closed in layers with Vicryl Monocryl.  Dermabond placed to level skin.  She tolerated procedure well without immediate complication.  All counts were correct at completion.  EBL 10 cc  Jency Schnieders C. Donzetta Matters, MD [facility] Office: [phone] Pager: [phone]

## 2018-09-14 ENCOUNTER — Encounter (HOSPITAL_COMMUNITY): Payer: Self-pay | Admitting: Vascular Surgery

## 2018-09-14 DIAGNOSIS — I129 Hypertensive chronic kidney disease with stage 1 through stage 4 chronic kidney disease, or unspecified chronic kidney disease: Secondary | ICD-10-CM | POA: Diagnosis not present

## 2018-09-14 DIAGNOSIS — N186 End stage renal disease: Secondary | ICD-10-CM | POA: Diagnosis not present

## 2018-09-14 DIAGNOSIS — Z992 Dependence on renal dialysis: Secondary | ICD-10-CM | POA: Diagnosis not present

## 2018-09-17 DIAGNOSIS — E611 Iron deficiency: Secondary | ICD-10-CM | POA: Diagnosis not present

## 2018-09-17 DIAGNOSIS — D689 Coagulation defect, unspecified: Secondary | ICD-10-CM | POA: Diagnosis not present

## 2018-09-17 DIAGNOSIS — N186 End stage renal disease: Secondary | ICD-10-CM | POA: Diagnosis not present

## 2018-09-17 DIAGNOSIS — N2581 Secondary hyperparathyroidism of renal origin: Secondary | ICD-10-CM | POA: Diagnosis not present

## 2018-09-17 DIAGNOSIS — D649 Anemia, unspecified: Secondary | ICD-10-CM | POA: Diagnosis not present

## 2018-09-25 DIAGNOSIS — H43821 Vitreomacular adhesion, right eye: Secondary | ICD-10-CM | POA: Diagnosis not present

## 2018-09-25 DIAGNOSIS — H43822 Vitreomacular adhesion, left eye: Secondary | ICD-10-CM | POA: Diagnosis not present

## 2018-09-25 DIAGNOSIS — E113593 Type 2 diabetes mellitus with proliferative diabetic retinopathy without macular edema, bilateral: Secondary | ICD-10-CM | POA: Diagnosis not present

## 2018-10-02 DIAGNOSIS — I5032 Chronic diastolic (congestive) heart failure: Secondary | ICD-10-CM | POA: Diagnosis not present

## 2018-10-02 DIAGNOSIS — G4733 Obstructive sleep apnea (adult) (pediatric): Secondary | ICD-10-CM | POA: Diagnosis not present

## 2018-10-02 DIAGNOSIS — R69 Illness, unspecified: Secondary | ICD-10-CM | POA: Diagnosis not present

## 2018-10-05 DIAGNOSIS — E611 Iron deficiency: Secondary | ICD-10-CM | POA: Diagnosis not present

## 2018-10-05 DIAGNOSIS — D689 Coagulation defect, unspecified: Secondary | ICD-10-CM | POA: Diagnosis not present

## 2018-10-05 DIAGNOSIS — D649 Anemia, unspecified: Secondary | ICD-10-CM | POA: Diagnosis not present

## 2018-10-05 DIAGNOSIS — N2581 Secondary hyperparathyroidism of renal origin: Secondary | ICD-10-CM | POA: Diagnosis not present

## 2018-10-05 DIAGNOSIS — N186 End stage renal disease: Secondary | ICD-10-CM | POA: Diagnosis not present

## 2018-10-12 ENCOUNTER — Other Ambulatory Visit: Payer: Self-pay

## 2018-10-12 DIAGNOSIS — N186 End stage renal disease: Secondary | ICD-10-CM

## 2018-10-12 DIAGNOSIS — Z992 Dependence on renal dialysis: Principal | ICD-10-CM

## 2018-10-13 DIAGNOSIS — N186 End stage renal disease: Secondary | ICD-10-CM | POA: Diagnosis not present

## 2018-10-13 DIAGNOSIS — Z992 Dependence on renal dialysis: Secondary | ICD-10-CM | POA: Diagnosis not present

## 2018-10-13 DIAGNOSIS — I129 Hypertensive chronic kidney disease with stage 1 through stage 4 chronic kidney disease, or unspecified chronic kidney disease: Secondary | ICD-10-CM | POA: Diagnosis not present

## 2018-10-15 DIAGNOSIS — D649 Anemia, unspecified: Secondary | ICD-10-CM | POA: Diagnosis not present

## 2018-10-15 DIAGNOSIS — N186 End stage renal disease: Secondary | ICD-10-CM | POA: Diagnosis not present

## 2018-10-15 DIAGNOSIS — D689 Coagulation defect, unspecified: Secondary | ICD-10-CM | POA: Diagnosis not present

## 2018-10-15 DIAGNOSIS — N2581 Secondary hyperparathyroidism of renal origin: Secondary | ICD-10-CM | POA: Diagnosis not present

## 2018-10-15 DIAGNOSIS — E611 Iron deficiency: Secondary | ICD-10-CM | POA: Diagnosis not present

## 2018-10-16 ENCOUNTER — Ambulatory Visit (HOSPITAL_COMMUNITY)
Admission: RE | Admit: 2018-10-16 | Discharge: 2018-10-16 | Disposition: A | Discharge status: Home / Self Care | Payer: Medicare HMO | Source: Ambulatory Visit | Attending: Family | Admitting: Family

## 2018-10-16 ENCOUNTER — Encounter: Payer: Self-pay | Admitting: Vascular Surgery

## 2018-10-16 ENCOUNTER — Ambulatory Visit (INDEPENDENT_AMBULATORY_CARE_PROVIDER_SITE_OTHER): Payer: Self-pay | Admitting: Vascular Surgery

## 2018-10-16 ENCOUNTER — Encounter: Payer: Self-pay | Admitting: *Deleted

## 2018-10-16 ENCOUNTER — Other Ambulatory Visit: Payer: Self-pay | Admitting: *Deleted

## 2018-10-16 ENCOUNTER — Encounter (HOSPITAL_COMMUNITY): Payer: Self-pay

## 2018-10-16 ENCOUNTER — Other Ambulatory Visit: Payer: Self-pay

## 2018-10-16 VITALS — BP 131/72 | HR 80 | Resp 18 | Ht 68.0 in | Wt 108.0 lb

## 2018-10-16 DIAGNOSIS — Z992 Dependence on renal dialysis: Principal | ICD-10-CM

## 2018-10-16 DIAGNOSIS — N186 End stage renal disease: Secondary | ICD-10-CM

## 2018-10-16 DIAGNOSIS — R079 Chest pain, unspecified: Secondary | ICD-10-CM | POA: Diagnosis not present

## 2018-10-16 NOTE — Progress Notes (Signed)
Patient name: [name] MRN: [id number] DOB: 09-06-1940 Sex: female  REASON FOR VISIT: Follow-up after left arm fistula ligation  HPI: [name] is a 78 y.o. female with multiple medical problems including end-stage renal disease that presents for follow-up after left brachiobasilic first stage fistula placement on 09/06/18 requiring ligation for steal on 09/13/18 by Dr. Donzetta Matters.  On evaluation today she states her left hand still feels numb in all 5 digits and is very painful.  Her basilic vein fistula is ligated and has no thrill.  She states she does not want to be evaluated for another fistula at this time.  She has a RIJ tunneled dialysis catheter.  Past Medical History:  Diagnosis Date  . Anemia    Pt is taking iron.   [name] Anxiety   . Arthritis   . Carotid stenosis    40-59% bilateral ICA stenosis in 2/12.  . Chronic low back pain   . CKD (chronic kidney disease)    Dr. Audie Clear at [facility]  . Coronary artery disease    Pt presented 2/10 to [facility] with NSTEMI and diastolic CHF exacerbation.  LHC was done  3/10 showing 99% pRCA stenosis and 80% calcified pLAD stenosis with L=>R collaterals.  Pt was referred  for CABG which was done by Dr. Prescott Gum with LIMA-LAD, SVG-RCA, SVG-OM.  . Diabetes mellitus   . Diabetic neuropathy ([place])   . Diastolic CHF (HCC)    Echo (2/10) showed EF 55-65%, mild LVH, diastolic dysfunction, mild AS with mean gradient 12 mmHg, PASP 43 mmHg.  Echo (2/12): EF 55-60%, mild LVH, mild AS (mean gradient 12), PA systolic pressure 32 mmHg.     [name] GERD (gastroesophageal reflux disease)   . Heart murmur   . Hyperlipidemia   . Hypertension   . Mild aortic stenosis    mean gradient 12 mmHg in 2/12.  . Myocardial infarction ([place])    "mild"  . Pneumonia   . PONV (postoperative nausea and vomiting)   . Stroke [facility])    " mild"  . Thrombocytopenia ([place])   . Unsteady gait     Past Surgical History:  Procedure Laterality Date  . AV FISTULA  PLACEMENT Left 01/30/2015   Procedure: Creation of a Radial Cephalic AV Fistula left wrist;  Surgeon: [name], MD;  Location: [place];  Service: Vascular;  Laterality: Left;  . AV FISTULA PLACEMENT Left 09/06/2018   Procedure: LEFT ARM ARTERIOVENOUS (AV) FISTULA CREATION;  Surgeon: Marty Heck, MD;  Location: [place];  Service: Vascular;  Laterality: Left;  . BACK SURGERY     multiple  . BREAST SURGERY     biopsy  . CARDIAC CATHETERIZATION    . CATARACT EXTRACTION W/ INTRAOCULAR LENS  IMPLANT, BILATERAL    . COLONOSCOPY Left 11/03/2016   Procedure: COLONOSCOPY;  Surgeon: [name], MD;  Location: [facility] ENDOSCOPY;  Service: Endoscopy;  Laterality: Left;  . COLONOSCOPY W/ BIOPSIES AND POLYPECTOMY    . CORONARY ARTERY BYPASS GRAFT  09/2008   pt with NSTEMI and diastolic CHF exacerbation.  LHC was done  3/10 showing 99% pRCA stenosis and 80% calcified pLAD stenosis with L=>R collaterals.  Pt was referred  for CABG which was done by Dr. Prescott Gum with LIMA-LAD, SVG-RCA, SVG-OM.  [name] ESOPHAGOGASTRODUODENOSCOPY N/A 11/02/2016   Procedure: ESOPHAGOGASTRODUODENOSCOPY (EGD);  Surgeon: [name], MD;  Location: [facility] ENDOSCOPY;  Service: Endoscopy;  Laterality: N/A;  . GIVENS CAPSULE STUDY N/A 11/29/2016   Procedure:  GIVENS CAPSULE STUDY;  Surgeon: [name], MD;  Location: [place];  Service: Endoscopy;  Laterality: N/A;  . LIGATION OF ARTERIOVENOUS  FISTULA Left 09/13/2018   Procedure: LIGATION OF LEFT ARTERIOVENOUS  FISTULA;  Surgeon: Waynetta Sandy, MD;  Location: [place];  Service: Vascular;  Laterality: Left;  . REVISON OF ARTERIOVENOUS FISTULA Left 07/02/2015   Procedure: REVISON OF LEFT RADIOCEPHALIC ARTERIOVENOUS FISTULA;  Surgeon: [name], MD;  Location: [place];  Service: Vascular;  Laterality: Left;  . RIGHT/LEFT HEART CATH AND CORONARY/GRAFT ANGIOGRAPHY N/A 05/18/2018   Procedure: RIGHT/LEFT HEART CATH AND CORONARY/GRAFT ANGIOGRAPHY;  Surgeon: Leonie Man, MD;   Location: [place] CV LAB;  Service: Cardiovascular;  Laterality: N/A;    Family History  Adopted: Yes  Family history unknown: Yes    SOCIAL HISTORY: Social History   Tobacco Use  . Smoking status: Former Smoker    Types: Cigarettes  . Smokeless tobacco: Never Used  . Tobacco comment: quit 1988  Substance Use Topics  . Alcohol use: Yes    Comment: daily    Allergies  Allergen Reactions  . Doxycycline Nausea And Vomiting    Caused "DEATHLY NAUSEA AND VOMITING"  . Lipitor [Atorvastatin] Other (See Comments)    Stomach pain  . Strawberry Extract Rash    Current Outpatient Medications  Medication Sig Dispense Refill  . ALPRAZolam (XANAX) 1 MG tablet Take 1 mg by mouth at bedtime.     . AMITIZA 24 MCG capsule Take 24 mcg by mouth 2 (two) times daily as needed for constipation.     [name] b complex-vitamin c-folic acid (NEPHRO-VITE) 0.8 MG TABS tablet Take 1 tablet by mouth daily.  3  . calcitRIOL (ROCALTROL) 0.5 MCG capsule Take 0.5 mcg by mouth daily.  1  . folic acid (FOLVITE) 1 MG tablet Take 1 tablet (1 mg total) by mouth daily. 30 tablet 0  . midodrine (PROAMATINE) 10 MG tablet Take 1 tablet (10 mg total) by mouth 2 (two) times daily with a meal. 60 tablet 0  . rosuvastatin (CRESTOR) 5 MG tablet Take 1 tablet (5 mg total) by mouth at bedtime. 90 tablet 3  . sevelamer carbonate (RENVELA) 800 MG tablet Take 1,600 mg 3 (three) times daily with meals by mouth.     . trimethoprim (TRIMPEX) 100 MG tablet Take 50 mg by mouth daily.     . vitamin B-12 (CYANOCOBALAMIN) 500 MCG tablet Take 1 tablet (500 mcg total) by mouth daily. 30 tablet 0  . HYDROcodone-acetaminophen (NORCO) 5-325 MG tablet Take 1 tablet by mouth every 6 (six) hours as needed for moderate pain. (Patient not taking: Reported on 10/16/2018) 8 tablet 0  . Nutritional Supplements (FEEDING SUPPLEMENT, NEPRO CARB STEADY,) LIQD Take 237 mLs by mouth 2 (two) times daily between meals. (Patient not taking: Reported on  10/16/2018) 30 Can 0   No current facility-administered medications for this visit.     REVIEW OF SYSTEMS:  [X]  denotes positive finding, [ ]  denotes negative finding Cardiac  Comments:  Chest pain or chest pressure:    Shortness of breath upon exertion:    Short of breath when lying flat:    Irregular heart rhythm:        Vascular    Pain in calf, thigh, or hip brought on by ambulation:    Pain in feet at night that wakes you up from your sleep:     Blood clot in your veins:    Leg swelling:  Pulmonary    Oxygen at home:    Productive cough:     Wheezing:         Neurologic    Sudden weakness in arms or legs:     Sudden numbness in arms or legs:     Sudden onset of difficulty speaking or slurred speech:    Temporary loss of vision in one eye:     Problems with dizziness:         Gastrointestinal    Blood in stool:     Vomited blood:         Genitourinary    Burning when urinating:     Blood in urine:        Psychiatric    Major depression:         Hematologic    Bleeding problems:    Problems with blood clotting too easily:        Skin    Rashes or ulcers:        Constitutional    Fever or chills:      PHYSICAL EXAM: Vitals:   10/16/18 1229  BP: 131/72  Pulse: 80  Resp: 18  SpO2: 100%  Weight: 108 lb (49 kg)  Height: 5\' 8"  (1.727 m)    GENERAL: The patient is a well-nourished female, in no acute distress. The vital signs are documented above. CARDIAC: There is a regular rate and rhythm.  VASCULAR:  2+ right radial and brachial pulse Very weak left radial pulse and weak brachial pulse  PULMONARY: There is good air exchange bilaterally without wheezing or rales. ABDOMEN: Soft and non-tender with normal pitched bowel sounds.  MUSCULOSKELETAL: There are no major deformities or cyanosis. NEUROLOGIC: Left hand grip strength intact.  No tissue loss.   DATA:   None  Assessment/Plan:  Patient with severe steal symptoms after left first  stage brachiobasilic fistula placement on 09/06/18 that required fistula ligation on 09/13/18.  She continues to have fairly severe symptoms in her left hand with numbness and pain.  She has a very weak radial pulse that is difficult to appreciate on exam.  I have offered her left upper extremity arteriogram see if there is any other inflow disease that we could potentially treat to help alleviate her symptoms.   Marty Heck, MD [facility] [place] Office: [phone] Pager: [phone]

## 2018-10-18 ENCOUNTER — Encounter (HOSPITAL_COMMUNITY)
Admission: RE | Disposition: A | Discharge status: Home / Self Care | Payer: Self-pay | Source: Ambulatory Visit | Attending: Vascular Surgery

## 2018-10-18 ENCOUNTER — Other Ambulatory Visit: Payer: Self-pay

## 2018-10-18 ENCOUNTER — Ambulatory Visit (HOSPITAL_COMMUNITY)
Admission: RE | Admit: 2018-10-18 | Discharge: 2018-10-18 | Disposition: A | Discharge status: Home / Self Care | Payer: Medicare HMO | Source: Ambulatory Visit | Attending: Vascular Surgery | Admitting: Vascular Surgery

## 2018-10-18 DIAGNOSIS — Z951 Presence of aortocoronary bypass graft: Secondary | ICD-10-CM | POA: Diagnosis not present

## 2018-10-18 DIAGNOSIS — R69 Illness, unspecified: Secondary | ICD-10-CM | POA: Diagnosis not present

## 2018-10-18 DIAGNOSIS — K219 Gastro-esophageal reflux disease without esophagitis: Secondary | ICD-10-CM | POA: Diagnosis not present

## 2018-10-18 DIAGNOSIS — I252 Old myocardial infarction: Secondary | ICD-10-CM | POA: Diagnosis not present

## 2018-10-18 DIAGNOSIS — Z79899 Other long term (current) drug therapy: Secondary | ICD-10-CM | POA: Insufficient documentation

## 2018-10-18 DIAGNOSIS — T82898A Other specified complication of vascular prosthetic devices, implants and grafts, initial encounter: Secondary | ICD-10-CM | POA: Diagnosis not present

## 2018-10-18 DIAGNOSIS — Z87891 Personal history of nicotine dependence: Secondary | ICD-10-CM | POA: Insufficient documentation

## 2018-10-18 DIAGNOSIS — E1122 Type 2 diabetes mellitus with diabetic chronic kidney disease: Secondary | ICD-10-CM | POA: Insufficient documentation

## 2018-10-18 DIAGNOSIS — E785 Hyperlipidemia, unspecified: Secondary | ICD-10-CM | POA: Insufficient documentation

## 2018-10-18 DIAGNOSIS — N186 End stage renal disease: Secondary | ICD-10-CM | POA: Diagnosis not present

## 2018-10-18 DIAGNOSIS — Z881 Allergy status to other antibiotic agents status: Secondary | ICD-10-CM | POA: Diagnosis not present

## 2018-10-18 DIAGNOSIS — E1140 Type 2 diabetes mellitus with diabetic neuropathy, unspecified: Secondary | ICD-10-CM | POA: Diagnosis not present

## 2018-10-18 DIAGNOSIS — I132 Hypertensive heart and chronic kidney disease with heart failure and with stage 5 chronic kidney disease, or end stage renal disease: Secondary | ICD-10-CM | POA: Insufficient documentation

## 2018-10-18 DIAGNOSIS — I5032 Chronic diastolic (congestive) heart failure: Secondary | ICD-10-CM | POA: Diagnosis not present

## 2018-10-18 DIAGNOSIS — I2510 Atherosclerotic heart disease of native coronary artery without angina pectoris: Secondary | ICD-10-CM | POA: Insufficient documentation

## 2018-10-18 DIAGNOSIS — I6523 Occlusion and stenosis of bilateral carotid arteries: Secondary | ICD-10-CM | POA: Insufficient documentation

## 2018-10-18 DIAGNOSIS — Z8673 Personal history of transient ischemic attack (TIA), and cerebral infarction without residual deficits: Secondary | ICD-10-CM | POA: Insufficient documentation

## 2018-10-18 DIAGNOSIS — M1990 Unspecified osteoarthritis, unspecified site: Secondary | ICD-10-CM | POA: Insufficient documentation

## 2018-10-18 DIAGNOSIS — Y841 Kidney dialysis as the cause of abnormal reaction of the patient, or of later complication, without mention of misadventure at the time of the procedure: Secondary | ICD-10-CM | POA: Diagnosis not present

## 2018-10-18 DIAGNOSIS — F419 Anxiety disorder, unspecified: Secondary | ICD-10-CM | POA: Insufficient documentation

## 2018-10-18 HISTORY — PX: UPPER EXTREMITY ANGIOGRAPHY: CATH118270

## 2018-10-18 HISTORY — PX: AORTIC ARCH ANGIOGRAPHY: CATH118224

## 2018-10-18 LAB — GLUCOSE, CAPILLARY: Glucose-Capillary: 100 mg/dL — ABNORMAL HIGH (ref 70–99)

## 2018-10-18 LAB — POCT I-STAT 4, (NA,K, GLUC, HGB,HCT)
Glucose, Bld: 119 mg/dL — ABNORMAL HIGH (ref 70–99)
HCT: 37 % (ref 36.0–46.0)
Hemoglobin: 12.6 g/dL (ref 12.0–15.0)
Potassium: 5.1 mmol/L (ref 3.5–5.1)
SODIUM: 138 mmol/L (ref 135–145)

## 2018-10-18 LAB — POCT I-STAT CREATININE: Creatinine, Ser: 3.9 mg/dL — ABNORMAL HIGH (ref 0.44–1.00)

## 2018-10-18 LAB — POCT ACTIVATED CLOTTING TIME: Activated Clotting Time: 213 seconds

## 2018-10-18 SURGERY — AORTIC ARCH ANGIOGRAPHY
Anesthesia: LOCAL

## 2018-10-18 MED ORDER — SODIUM CHLORIDE 0.9% FLUSH: 3.0000 mL | INTRAVENOUS | Status: DC | PRN

## 2018-10-18 MED ORDER — HEPARIN (PORCINE) IN NACL 1000-0.9 UT/500ML-% IV SOLN
INTRAVENOUS | Status: DC | PRN
  Administered 2018-10-18 (×2): 500 mL

## 2018-10-18 MED ORDER — LABETALOL HCL 5 MG/ML IV SOLN: 10.0000 mg | INTRAVENOUS | Status: DC | PRN

## 2018-10-18 MED ORDER — MIDAZOLAM HCL 2 MG/2ML IJ SOLN
INTRAMUSCULAR | Status: DC | PRN
  Administered 2018-10-18: 1 mg via INTRAVENOUS

## 2018-10-18 MED ORDER — SODIUM CHLORIDE 0.9% FLUSH: 3.0000 mL | Freq: Two times a day (BID) | INTRAVENOUS | Status: DC

## 2018-10-18 MED ORDER — SODIUM CHLORIDE 0.9 % IV SOLN: 250.0000 mL | INTRAVENOUS | Status: DC | PRN

## 2018-10-18 MED ORDER — HEPARIN (PORCINE) IN NACL 1000-0.9 UT/500ML-% IV SOLN
INTRAVENOUS | Status: AC
  Filled 2018-10-18: qty 1000

## 2018-10-18 MED ORDER — MIDAZOLAM HCL 2 MG/2ML IJ SOLN
INTRAMUSCULAR | Status: AC
  Filled 2018-10-18: qty 2

## 2018-10-18 MED ORDER — IODIXANOL 320 MG/ML IV SOLN
INTRAVENOUS | Status: DC | PRN
  Administered 2018-10-18: 120 mL via INTRA_ARTERIAL

## 2018-10-18 MED ORDER — LIDOCAINE HCL (PF) 1 % IJ SOLN
INTRAMUSCULAR | Status: AC
  Filled 2018-10-18: qty 30

## 2018-10-18 MED ORDER — ACETAMINOPHEN 325 MG PO TABS: 650.0000 mg | ORAL_TABLET | ORAL | Status: DC | PRN

## 2018-10-18 MED ORDER — LIDOCAINE HCL (PF) 1 % IJ SOLN
INTRAMUSCULAR | Status: DC | PRN
  Administered 2018-10-18: 15 mL via INTRADERMAL

## 2018-10-18 MED ORDER — FENTANYL CITRATE (PF) 100 MCG/2ML IJ SOLN
INTRAMUSCULAR | Status: AC
  Filled 2018-10-18: qty 2

## 2018-10-18 MED ORDER — ONDANSETRON HCL 4 MG/2ML IJ SOLN: 4.0000 mg | Freq: Four times a day (QID) | INTRAMUSCULAR | Status: DC | PRN

## 2018-10-18 MED ORDER — FENTANYL CITRATE (PF) 100 MCG/2ML IJ SOLN
INTRAMUSCULAR | Status: DC | PRN
  Administered 2018-10-18: 25 ug via INTRAVENOUS

## 2018-10-18 MED ORDER — HEPARIN SODIUM (PORCINE) 1000 UNIT/ML IJ SOLN
INTRAMUSCULAR | Status: DC | PRN
  Administered 2018-10-18: 2000 [IU] via INTRAVENOUS
  Administered 2018-10-18: 3000 [IU] via INTRAVENOUS

## 2018-10-18 MED ORDER — HYDRALAZINE HCL 20 MG/ML IJ SOLN: 5.0000 mg | INTRAMUSCULAR | Status: DC | PRN

## 2018-10-18 SURGICAL SUPPLY — 18 items
CATH ANGIO 5F BER2 100CM (CATHETERS) ×3 IMPLANT
CATH ANGIO 5F PIGTAIL 100CM (CATHETERS) ×3 IMPLANT
CATH ANGIO 5F SIM1 100CM (CATHETERS) ×3 IMPLANT
CATH OMNI FLUSH 5F 65CM (CATHETERS) IMPLANT
CLOSURE MYNX CONTROL 6F/7F (Vascular Products) ×3 IMPLANT
GLIDEWIRE ADV .035X260CM (WIRE) ×3 IMPLANT
GUIDEWIRE ANGLED .035X150CM (WIRE) ×3 IMPLANT
KIT ENCORE 26 ADVANTAGE (KITS) ×3 IMPLANT
KIT MICROPUNCTURE NIT STIFF (SHEATH) ×6 IMPLANT
KIT PV (KITS) ×3 IMPLANT
SHEATH PINNACLE 5F 10CM (SHEATH) ×3 IMPLANT
SHEATH PINNACLE 6F 10CM (SHEATH) ×3 IMPLANT
SHEATH PROBE COVER 6X72 (BAG) ×3 IMPLANT
SHEATH SHUTTLE SELECT 6F (SHEATH) ×3 IMPLANT
SYR MEDRAD MARK V 150ML (SYRINGE) ×3 IMPLANT
TRANSDUCER W/STOPCOCK (MISCELLANEOUS) ×3 IMPLANT
TRAY PV CATH (CUSTOM PROCEDURE TRAY) ×3 IMPLANT
WIRE BENTSON .035X145CM (WIRE) ×3 IMPLANT

## 2018-10-18 NOTE — Discharge Instructions (Signed)
Angiogram, Care After °This sheet gives you information about how to care for yourself after your procedure. Your doctor may also give you more specific instructions. If you have problems or questions, contact your doctor. °Follow these instructions at home: °Insertion site care °· Follow instructions from your doctor about how to take care of your long, thin tube (catheter) insertion area. Make sure you: °? Wash your hands with soap and water before you change your bandage (dressing). If you cannot use soap and water, use hand sanitizer. °? Change your bandage as told by your doctor. °? Leave stitches (sutures), skin glue, or skin tape (adhesive) strips in place. They may need to stay in place for 2 weeks or longer. If tape strips get loose and curl up, you may trim the loose edges. Do not remove tape strips completely unless your doctor says it is okay. °· Do not take baths, swim, or use a hot tub until your doctor says it is okay. °· You may shower 24-48 hours after the procedure or as told by your doctor. °? Gently wash the area with plain soap and water. °? Pat the area dry with a clean towel. °? Do not rub the area. This may cause bleeding. °· Do not apply powder or lotion to the area. Keep the area clean and dry. °· Check your insertion area every day for signs of infection. Check for: °? More redness, swelling, or pain. °? Fluid or blood. °? Warmth. °? Pus or a bad smell. °Activity °· Rest as told by your doctor, usually for 1-2 days. °· Do not lift anything that is heavier than 10 lbs. (4.5 kg) or as told by your doctor. °· Do not drive for 24 hours if you were given a medicine to help you relax (sedative). °· Do not drive or use heavy machinery while taking prescription pain medicine. °General instructions ° °· Go back to your normal activities as told by your doctor, usually in about a week. Ask your doctor what activities are safe for you. °· If the insertion area starts to bleed, lie flat and put  pressure on the area. If the bleeding does not stop, get help right away. This is an emergency. °· Drink enough fluid to keep your pee (urine) clear or pale yellow. °· Take over-the-counter and prescription medicines only as told by your doctor. °· Keep all follow-up visits as told by your doctor. This is important. °Contact a doctor if: °· You have a fever. °· You have chills. °· You have more redness, swelling, or pain around your insertion area. °· You have fluid or blood coming from your insertion area. °· The insertion area feels warm to the touch. °· You have pus or a bad smell coming from your insertion area. °· You have more bruising around the insertion area. °· Blood collects in the tissue around the insertion area (hematoma) that may be painful to the touch. °Get help right away if: °· You have a lot of pain in the insertion area. °· The insertion area swells very fast. °· The insertion area is bleeding, and the bleeding does not stop after holding steady pressure on the area. °· The area near or just beyond the insertion area becomes pale, cool, tingly, or numb. °These symptoms may be an emergency. Do not wait to see if the symptoms will go away. Get medical help right away. Call your local emergency services (911 in the [place] Do not drive yourself to the hospital. °  Summary  After the procedure, it is common to have bruising and tenderness at the long, thin tube insertion area.  After the procedure, it is important to rest and drink plenty of fluids.  Do not take baths, swim, or use a hot tub until your doctor says it is okay to do so. You may shower 24-48 hours after the procedure or as told by your doctor.  If the insertion area starts to bleed, lie flat and put pressure on the area. If the bleeding does not stop, get help right away. This is an emergency. This information is not intended to replace advice given to you by your health care provider. Make sure you discuss any questions you have  with your health care provider. Document Released: 10/28/2008 Document Revised: 07/26/2016 Document Reviewed: 07/26/2016 Elsevier Interactive Patient Education  2019 [place].  Moderate Conscious Sedation, Adult, Care After These instructions provide you with information about caring for yourself after your procedure. Your health care provider may also give you more specific instructions. Your treatment has been planned according to current medical practices, but problems sometimes occur. Call your health care provider if you have any problems or questions after your procedure. What can I expect after the procedure? After your procedure, it is common:  To feel sleepy for several hours.  To feel clumsy and have poor balance for several hours.  To have poor judgment for several hours.  To vomit if you eat too soon. Follow these instructions at home: For at least 24 hours after the procedure:   Do not: ? Participate in activities where you could fall or become injured. ? Drive. ? Use heavy machinery. ? Drink alcohol. ? Take sleeping pills or medicines that cause drowsiness. ? Make important decisions or sign legal documents. ? Take care of children on your own.  Rest. Eating and drinking  Follow the diet recommended by your health care provider.  If you vomit: ? Drink water, juice, or soup when you can drink without vomiting. ? Make sure you have little or no nausea before eating solid foods. General instructions  Have a responsible adult stay with you until you are awake and alert.  Take over-the-counter and prescription medicines only as told by your health care provider.  If you smoke, do not smoke without supervision.  Keep all follow-up visits as told by your health care provider. This is important. Contact a health care provider if:  You keep feeling nauseous or you keep vomiting.  You feel light-headed.  You develop a rash.  You have a fever. Get help  right away if:  You have trouble breathing. This information is not intended to replace advice given to you by your health care provider. Make sure you discuss any questions you have with your health care provider. Document Released: 05/22/2013 Document Revised: 01/04/2016 Document Reviewed: 11/21/2015 Elsevier Interactive Patient Education  2019 [employer].

## 2018-10-18 NOTE — Op Note (Signed)
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  US guided access of right common femoral artery 2.  Arch aortogram 3.  Left upper extremity arteriogram with selection of second order branches 4.  Mynx closure of the right common femoral artery 5.  64 minutes monitored moderate conscious sedation time  Indications: Patient is a 78 year old female that recently underwent first stage left upper extremity brachiobasilic fistula placement.  She had severe steal symptoms postoperatively and required ligation of her fistula.  She was seen in clinic earlier this week with ongoing numbness in her left hand with a weaker radial pulse and a left upper extremity arteriogram with arch aortogram was recommended after risk and benefits were discussed.  Findings: Arch aortogram showed calcific disease in the proximal innominate artery as well as the proximal right subclavian with approximate 40-50% stenosis in each artery.  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  Her left vertebral artery is the dominant inflow for her posterior circulation.  The remaining left subclavian, axillary, brachial, radial, and ulnar arteries were patent.  The radial and ulnar arteries were small diminutive and appear diseased in the forearm.  She has small vessel disease in the left hand with incomplete palmar arch and diseased digital arteries. Ultimately we did get a wire across the left subclavian lesion and had a long shuttle sheath in the proximal left subclavian artery.  However upon closer imaging we identified that the lesion was at the level of the takeoff of her LIMA to LAD bypass and my concern was that  any intervention here could shower embolic plaque down coronary bypass and/or shut down the coronary bypass and certainly could not stent here if dissection after angioplasty.  As a result wires and catheters were removed and a mynx closure was deployed in the right groin.   Procedure:  The patient was identified in the holding area and taken to room 8.  The patient was then placed supine on the table and prepped and draped in the usual sterile fashion.  A time out was called.  Ultrasound was used to evaluate the right common femoral artery.  It was patent .  A digital ultrasound image was acquired.  A micropuncture needle was used to access the right common femoral artery under ultrasound guidance.  An 018 wire was advanced without resistance and a micropuncture sheath was placed.  The 018 wire was removed and a benson wire was placed.  The micropuncture sheath was exchanged for a 5 french sheath.  We had to use a soft angled glide wire to cross her iliac artery.  An omniflush catheter was advanced over the wire to the ascending aortic arch.  The patient was given 3000 units of IV heparin prior to advancement of the catheter in the arch.  At that time a limited arch aortogram was then obtained.  Given that the left subclavian was the site of interest.  We initially used a berstein 2 catheter with a Glidewire advantage to select the left subclavian artery.  We had trouble getting the catheter to track over the wire and exchanged for a Simmons 1 catheter with a Glidewire advantage and ultimately selected the left subclavian were able to  cross the subclavian stenosis that was initially identified.  We were able to get our wire out into the distal axillary artery and the simmons 1 catheter tracked here.  A complete left arm arteriogram with runoff was obtained with findings noted above.  At that point in time the patient was given another 2000 units of IV heparin.  We then selected a long 6 [place] Ansell sheath that  was advanced up into the proximal subclavian artery from the right groin.  We then got dedicated images of the left subclavian artery lesion given close proximity to the left vertebral artery.  We were able identify that the left vertebral artery was the dominant vertebral artery and there did appear to be room distal to the vertebral artery to perform intervention.  Unfortunately just proximal to the lesion almost at the same level was a LIMA to LAD bypass graft that remains patent.  I did quickly consult with cardiology and they shared my concern that any intervention here would be exceedingly risky.  As a result we elected to abort intervention given the location of the lesion.  Wires and catheters were then removed and exchanged for a short 6 French sheath in the right groin.  A mrnx closure device was deployed.  Additional pressure was held.  Plan: I discussed with the patient she likely need a left carotid subclavian bypass if her symptoms are ongoing.  She has numbness in the left hand at this time with no motor weakness or tissue loss.  She wants to see if she can tolerate the symptoms and see if there is any ongoing improvement.  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   Marty Heck, MD [facility] [place] Office: [phone] Pager: [place]

## 2018-10-18 NOTE — Progress Notes (Signed)
Ambulated patient to BR and right groin formed hematoma (baseball size).  Pressure held on right groin 15- 20 minutes and Paul from Cath lab came to assess.  Eddie Dibbles spoke with Dr. Carlis Abbott who stated we should keep patient on Bedrest for 2 more hours, if groin okay can discharge home.

## 2018-10-18 NOTE — H&P (Signed)
History and Physical Interval Note:  10/18/2018 8:37 AM  [name]  has presented today for surgery, with the diagnosis of poor flow in fistula  The various methods of treatment have been discussed with the patient and family. After consideration of risks, benefits and other options for treatment, the patient has consented to  Procedure(s): AORTIC ARCH ANGIOGRAPHY (N/A) UPPER EXTREMITY ANGIOGRAPHY (N/A) as a surgical intervention .  The patient's history has been reviewed, patient examined, no change in status, stable for surgery.  I have reviewed the patient's chart and labs.  Questions were answered to the patient's satisfaction.    Arch aortogram, left arm arteriogram.  Marty Heck  Patient name: [name] MRN: [id number]        DOB: 06-02-1941          Sex: female  REASON FOR VISIT: Follow-up after left arm fistula ligation  HPI: [name] is a 78 y.o. female with multiple medical problems including end-stage renal disease that presents for follow-up after left brachiobasilic first stage fistula placement on 09/06/18 requiring ligation for steal on 09/13/18 by Dr. Donzetta Matters.  On evaluation today she states her left hand still feels numb in all 5 digits and is very painful.  Her basilic vein fistula is ligated and has no thrill.  She states she does not want to be evaluated for another fistula at this time.  She has a RIJ tunneled dialysis catheter.      Past Medical History:  Diagnosis Date  . Anemia    Pt is taking iron.   [name] Anxiety   . Arthritis   . Carotid stenosis    40-59% bilateral ICA stenosis in 2/12.  . Chronic low back pain   . CKD (chronic kidney disease)    Dr. Audie Clear at [facility]  . Coronary artery disease    Pt presented 2/10 to [facility] with NSTEMI and diastolic CHF exacerbation.  LHC was done  3/10 showing 99% pRCA stenosis and 80% calcified pLAD stenosis with L=>R collaterals.  Pt was referred  for CABG which was done by Dr.  Prescott Gum with LIMA-LAD, SVG-RCA, SVG-OM.  . Diabetes mellitus   . Diabetic neuropathy ([place])   . Diastolic CHF (HCC)    Echo (2/10) showed EF 55-65%, mild LVH, diastolic dysfunction, mild AS with mean gradient 12 mmHg, PASP 43 mmHg.  Echo (2/12): EF 55-60%, mild LVH, mild AS (mean gradient 12), PA systolic pressure 32 mmHg.     [name] GERD (gastroesophageal reflux disease)   . Heart murmur   . Hyperlipidemia   . Hypertension   . Mild aortic stenosis    mean gradient 12 mmHg in 2/12.  . Myocardial infarction ([place])    "mild"  . Pneumonia   . PONV (postoperative nausea and vomiting)   . Stroke [facility])    " mild"  . Thrombocytopenia ([place])   . Unsteady gait          Past Surgical History:  Procedure Laterality Date  . AV FISTULA PLACEMENT Left 01/30/2015   Procedure: Creation of a Radial Cephalic AV Fistula left wrist;  Surgeon: [name], MD;  Location: [place];  Service: Vascular;  Laterality: Left;  . AV FISTULA PLACEMENT Left 09/06/2018   Procedure: LEFT ARM ARTERIOVENOUS (AV) FISTULA CREATION;  Surgeon: Marty Heck, MD;  Location: [place];  Service: Vascular;  Laterality: Left;  . BACK SURGERY     multiple  . BREAST SURGERY  biopsy  . CARDIAC CATHETERIZATION    . CATARACT EXTRACTION W/ INTRAOCULAR LENS  IMPLANT, BILATERAL    . COLONOSCOPY Left 11/03/2016   Procedure: COLONOSCOPY;  Surgeon: [name], MD;  Location: [facility] ENDOSCOPY;  Service: Endoscopy;  Laterality: Left;  . COLONOSCOPY W/ BIOPSIES AND POLYPECTOMY    . CORONARY ARTERY BYPASS GRAFT  09/2008   pt with NSTEMI and diastolic CHF exacerbation.  LHC was done  3/10 showing 99% pRCA stenosis and 80% calcified pLAD stenosis with L=>R collaterals.  Pt was referred  for CABG which was done by Dr. Prescott Gum with LIMA-LAD, SVG-RCA, SVG-OM.  [name] ESOPHAGOGASTRODUODENOSCOPY N/A 11/02/2016   Procedure: ESOPHAGOGASTRODUODENOSCOPY (EGD);  Surgeon: [name], MD;  Location[facility] END[facility];   Service: Endoscopy;  Laterality: N/A;  . GIVENS CAPSULE STUDY N/A 11/29/2016   Procedure: GIVENS CAPSULE STUDY;  Surgeon: [name], MD;  Location: [place];  Service: Endoscopy;  Laterality: N/A;  . LIGATION OF ARTERIOVENOUS  FISTULA Left 09/13/2018   Procedure: LIGATION OF LEFT ARTERIOVENOUS  FISTULA;  Surgeon: Waynetta Sandy, MD;  Location: [place];  Service: Vascular;  Laterality: Left;  . REVISON OF ARTERIOVENOUS FISTULA Left 07/02/2015   Procedure: REVISON OF LEFT RADIOCEPHALIC ARTERIOVENOUS FISTULA;  Surgeon: [name], MD;  Location: [place];  Service: Vascular;  Laterality: Left;  . RIGHT/LEFT HEART CATH AND CORONARY/GRAFT ANGIOGRAPHY N/A 05/18/2018   Procedure: RIGHT/LEFT HEART CATH AND CORONARY/GRAFT ANGIOGRAPHY;  Surgeon: Leonie Man, MD;  Location: [place] [facility];  Service: Cardiovascular;  Laterality: N/A;    Family History  Adopted: Yes  Family history unknown: Yes    SOCIAL HISTORY: Social History        Tobacco Use  . Smoking status: Former Smoker    Types: Cigarettes  . Smokeless tobacco: Never Used  . Tobacco comment: quit 1988  Substance Use Topics  . Alcohol use: Yes    Comment: daily         Allergies  Allergen Reactions  . Doxycycline Nausea And Vomiting    Caused "DEATHLY NAUSEA AND VOMITING"  . Lipitor [Atorvastatin] Other (See Comments)    Stomach pain  . Strawberry Extract Rash          Current Outpatient Medications  Medication Sig Dispense Refill  . ALPRAZolam (XANAX) 1 MG tablet Take 1 mg by mouth at bedtime.     . AMITIZA 24 MCG capsule Take 24 mcg by mouth 2 (two) times daily as needed for constipation.     [name] b complex-vitamin c-folic acid (NEPHRO-VITE) 0.8 MG TABS tablet Take 1 tablet by mouth daily.  3  . calcitRIOL (ROCALTROL) 0.5 MCG capsule Take 0.5 mcg by mouth daily.  1  . folic acid (FOLVITE) 1 MG tablet Take 1 tablet (1 mg total) by mouth daily. 30 tablet 0  . midodrine  (PROAMATINE) 10 MG tablet Take 1 tablet (10 mg total) by mouth 2 (two) times daily with a meal. 60 tablet 0  . rosuvastatin (CRESTOR) 5 MG tablet Take 1 tablet (5 mg total) by mouth at bedtime. 90 tablet 3  . sevelamer carbonate (RENVELA) 800 MG tablet Take 1,600 mg 3 (three) times daily with meals by mouth.     . trimethoprim (TRIMPEX) 100 MG tablet Take 50 mg by mouth daily.     . vitamin B-12 (CYANOCOBALAMIN) 500 MCG tablet Take 1 tablet (500 mcg total) by mouth daily. 30 tablet 0  . HYDROcodone-acetaminophen (NORCO) 5-325 MG tablet Take 1 tablet by mouth every 6 (  six) hours as needed for moderate pain. (Patient not taking: Reported on 10/16/2018) 8 tablet 0  . Nutritional Supplements (FEEDING SUPPLEMENT, NEPRO CARB STEADY,) LIQD Take 237 mLs by mouth 2 (two) times daily between meals. (Patient not taking: Reported on 10/16/2018) 30 Can 0   No current facility-administered medications for this visit.     REVIEW OF SYSTEMS:  [X]  denotes positive finding, [ ]  denotes negative finding Cardiac  Comments:  Chest pain or chest pressure:    Shortness of breath upon exertion:    Short of breath when lying flat:    Irregular heart rhythm:        Vascular    Pain in calf, thigh, or hip brought on by ambulation:    Pain in feet at night that wakes you up from your sleep:     Blood clot in your veins:    Leg swelling:         Pulmonary    Oxygen at home:    Productive cough:     Wheezing:         Neurologic    Sudden weakness in arms or legs:     Sudden numbness in arms or legs:     Sudden onset of difficulty speaking or slurred speech:    Temporary loss of vision in one eye:     Problems with dizziness:         Gastrointestinal    Blood in stool:     Vomited blood:         Genitourinary    Burning when urinating:     Blood in urine:        Psychiatric    Major depression:         Hematologic     Bleeding problems:    Problems with blood clotting too easily:        Skin    Rashes or ulcers:        Constitutional    Fever or chills:      PHYSICAL EXAM:    Vitals:   10/16/18 1229  BP: 131/72  Pulse: 80  Resp: 18  SpO2: 100%  Weight: 108 lb (49 kg)  Height: 5\' 8"  (1.727 m)    GENERAL: The patient is a well-nourished female, in no acute distress. The vital signs are documented above. CARDIAC: There is a regular rate and rhythm.  VASCULAR:  2+ right radial and brachial pulse Very weak left radial pulse and weak brachial pulse  PULMONARY: There is good air exchange bilaterally without wheezing or rales. ABDOMEN: Soft and non-tender with normal pitched bowel sounds.  MUSCULOSKELETAL: There are no major deformities or cyanosis. NEUROLOGIC: Left hand grip strength intact.  No tissue loss.   DATA:   None  Assessment/Plan:  Patient with severe steal symptoms after left first stage brachiobasilic fistula placement on 09/06/18 that required fistula ligation on 09/13/18.  She continues to have fairly severe symptoms in her left hand with numbness and pain.  She has a very weak radial pulse that is difficult to appreciate on exam.  I have offered her left upper extremity arteriogram see if there is any other inflow disease that we could potentially treat to help alleviate her symptoms.   Marty Heck, MD [facility] Office: [phone] Pager: [phone]

## 2018-10-18 NOTE — Progress Notes (Signed)
Called Dr. Carlis Abbott while Lattie Haw RN was holding pressure on rt  Groin.  Notified Dr. Carlis Abbott that for the second time patient has had a hematoma.  Per Dr. Carlis Abbott patient will need to stay for 6 more hours of bedrest.

## 2018-10-19 ENCOUNTER — Encounter (HOSPITAL_COMMUNITY): Payer: Self-pay | Admitting: Vascular Surgery

## 2018-10-23 ENCOUNTER — Telehealth: Payer: Self-pay | Admitting: Vascular Surgery

## 2018-10-23 NOTE — Telephone Encounter (Signed)
Called and spoke with patient advised her of her appt on 11/20/18 2:00 U/S and 3:15 with Dr. Carlis Abbott.  Will mail out follow up paperwork.

## 2018-10-30 ENCOUNTER — Ambulatory Visit: Payer: Medicare HMO | Admitting: Nurse Practitioner

## 2018-10-31 DIAGNOSIS — N186 End stage renal disease: Secondary | ICD-10-CM | POA: Diagnosis not present

## 2018-10-31 DIAGNOSIS — N2581 Secondary hyperparathyroidism of renal origin: Secondary | ICD-10-CM | POA: Diagnosis not present

## 2018-10-31 DIAGNOSIS — D689 Coagulation defect, unspecified: Secondary | ICD-10-CM | POA: Diagnosis not present

## 2018-10-31 DIAGNOSIS — E611 Iron deficiency: Secondary | ICD-10-CM | POA: Diagnosis not present

## 2018-10-31 DIAGNOSIS — D649 Anemia, unspecified: Secondary | ICD-10-CM | POA: Diagnosis not present

## 2018-11-08 DIAGNOSIS — M25561 Pain in right knee: Secondary | ICD-10-CM | POA: Diagnosis not present

## 2018-11-13 DIAGNOSIS — N186 End stage renal disease: Secondary | ICD-10-CM | POA: Diagnosis not present

## 2018-11-13 DIAGNOSIS — I129 Hypertensive chronic kidney disease with stage 1 through stage 4 chronic kidney disease, or unspecified chronic kidney disease: Secondary | ICD-10-CM | POA: Diagnosis not present

## 2018-11-13 DIAGNOSIS — M25561 Pain in right knee: Secondary | ICD-10-CM | POA: Diagnosis not present

## 2018-11-13 DIAGNOSIS — Z992 Dependence on renal dialysis: Secondary | ICD-10-CM | POA: Diagnosis not present

## 2018-11-14 DIAGNOSIS — N186 End stage renal disease: Secondary | ICD-10-CM | POA: Diagnosis not present

## 2018-11-14 DIAGNOSIS — D689 Coagulation defect, unspecified: Secondary | ICD-10-CM | POA: Diagnosis not present

## 2018-11-14 DIAGNOSIS — N2581 Secondary hyperparathyroidism of renal origin: Secondary | ICD-10-CM | POA: Diagnosis not present

## 2018-11-16 ENCOUNTER — Other Ambulatory Visit: Payer: Self-pay

## 2018-11-16 DIAGNOSIS — I6523 Occlusion and stenosis of bilateral carotid arteries: Secondary | ICD-10-CM

## 2018-11-16 DIAGNOSIS — N185 Chronic kidney disease, stage 5: Secondary | ICD-10-CM

## 2018-11-19 ENCOUNTER — Telehealth: Payer: Self-pay | Admitting: Nurse Practitioner

## 2018-11-19 NOTE — Telephone Encounter (Signed)
Agrees to changing to virtual visit for Tuesday April 14 at [place], RN, [place] [address] [place], [place]  [address] [phone]

## 2018-11-20 ENCOUNTER — Encounter: Payer: Medicare HMO | Admitting: Vascular Surgery

## 2018-11-20 ENCOUNTER — Encounter (HOSPITAL_COMMUNITY): Payer: Medicare HMO

## 2018-11-22 ENCOUNTER — Telehealth: Payer: Self-pay | Admitting: *Deleted

## 2018-11-22 NOTE — Telephone Encounter (Signed)
Virtual Visit Pre-Appointment Phone Call  Steps For Call:  1. Confirm consent - "In the setting of the current Covid19 crisis, you are scheduled for a (phone) visit with your provider on (Tuesday, April 14) at (9:00 am).  Just as we do with many in-office visits, in order for you to participate in this visit, we must obtain consent.  If you'd like, I can send this to your mychart (if signed up) or email for you to review.  Otherwise, I can obtain your verbal consent now.  All virtual visits are billed to your insurance company just like a normal visit would be.  By agreeing to a virtual visit, we'd like you to understand that the technology does not allow for your provider to perform an examination, and thus may limit your provider's ability to fully assess your condition.  Finally, though the technology is pretty good, we cannot assure that it will always work on either your or our end, and in the setting of a video visit, we may have to convert it to a phone-only visit.  In either situation, we cannot ensure that we have a secure connection.  Are you willing to proceed?"  2. Give patient instructions for WebEx download to smartphone as below if video visit  3. Advise patient to be prepared with any vital sign or heart rhythm information, their current medicines, and a piece of paper and pen handy for any instructions they may receive the day of their visit  4. Inform patient they will receive a phone call 15 minutes prior to their appointment time (may be from unknown caller ID) so they should be prepared to answer  5. Confirm that appointment type is correct in Epic appointment notes (video vs telephone)    TELEPHONE CALL NOTE  [name] has been deemed a candidate for a follow-up tele-health visit to limit community exposure during the Covid-19 pandemic. I spoke with the patient via phone to ensure availability of phone/video source, confirm preferred email & phone number, and discuss  instructions and expectations.  I reminded [name] to be prepared with any vital sign and/or heart rhythm information that could potentially be obtained via home monitoring, at the time of her visit. I reminded [name] to expect a phone call at the time of her visit if her visit.  Did the patient verbally acknowledge consent to treatment? Yes.  Danielle Avanell Shackleton 11/22/2018 10:13 AM   DOWNLOADING THE [place]  - If Apple, go to [employer] and type in WebEx in the search bar. [place] [employer], the blue/green circle. The app is free but as with any other app downloads, their phone may require them to verify saved payment information or Apple password. The patient does NOT have to create an account.  - If Android, ask patient to go to [employer] and type in WebEx in the search bar. [place] [employer], the blue/green circle. The app is free but as with any other app downloads, their phone may require them to verify saved payment information or Android password. The patient does NOT have to create an account.   CONSENT FOR TELE-HEALTH VISIT - PLEASE REVIEW  I hereby voluntarily request, consent and authorize [facility] and its employed or contracted physicians, Engineer, materials, nurse practitioners or other licensed health care professionals Truitt Merle, NP), to provide me with telemedicine health care services (Phone) as deemed necessary by the treating Practitioner.  I acknowledge and consent to receive the Services by the Practitioner via telemedicine. I understand that the telemedicine visit will involve communicating with the Practitioner through live audiovisual communication technology and the disclosure of certain medical information by electronic transmission. I acknowledge that I have been given the opportunity to request an in-person assessment or other available alternative prior to the telemedicine visit and am  voluntarily participating in the telemedicine visit.  I understand that I have the right to withhold or withdraw my consent to the use of telemedicine in the course of my care at any time, without affecting my right to future care or treatment, and that the Practitioner or I may terminate the telemedicine visit at any time. I understand that I have the right to inspect all information obtained and/or recorded in the course of the telemedicine visit and may receive copies of available information for a reasonable fee.  I understand that some of the potential risks of receiving the Services via telemedicine include:  [name] Delay or interruption in medical evaluation due to technological equipment failure or disruption; . Information transmitted may not be sufficient (e.g. poor resolution of images) to allow for appropriate medical decision making by the Practitioner; and/or  . In rare instances, security protocols could fail, causing a breach of personal health information.  Furthermore, I acknowledge that it is my responsibility to provide information about my medical history, conditions and care that is complete and accurate to the best of my ability. I acknowledge that Practitioner's advice, recommendations, and/or decision may be based on factors not within their control, such as incomplete or inaccurate data provided by me or distortions of diagnostic images or specimens that may result from electronic transmissions. I understand that the practice of medicine is not an exact science and that Practitioner makes no warranties or guarantees regarding treatment outcomes. I acknowledge that I will receive a copy of this consent concurrently upon execution via email to the email address I last provided but may also request a printed copy by calling the office of [place].    I understand that my insurance will be billed for this visit.   I have read or had this consent read to me. . I understand the  contents of this consent, which adequately explains the benefits and risks of the Services being provided via telemedicine.  . I have been provided ample opportunity to ask questions regarding this consent and the Services and have had my questions answered to my satisfaction. . I give my informed consent for the services to be provided through the use of telemedicine in my medical care  By participating in this telemedicine visit I agree to the above.

## 2018-11-26 ENCOUNTER — Telehealth: Payer: Self-pay | Admitting: *Deleted

## 2018-11-26 NOTE — Telephone Encounter (Signed)
Virtual Visit Pre-Appointment Phone Call  Steps For Call:  1. Confirm consent - "In the setting of the current Covid19 crisis, you are scheduled for a (phone ) visit with your provider on Tuesday, April 14 at (9:00 am).  Just as we do with many in-office visits, in order for you to participate in this visit, we must obtain consent.  If you'd like, I can send this to your mychart (if signed up) or email for you to review.  Otherwise, I can obtain your verbal consent now.  All virtual visits are billed to your insurance company just like a normal visit would be.  By agreeing to a virtual visit, we'd like you to understand that the technology does not allow for your provider to perform an examination, and thus may limit your provider's ability to fully assess your condition.  Finally, though the technology is pretty good, we cannot assure that it will always work on either your or our end, and in the setting of a video visit, we may have to convert it to a phone-only visit.  In either situation, we cannot ensure that we have a secure connection.  Are you willing to proceed?"  2. Give patient instructions for WebEx download to smartphone as below if video visit  3. Advise patient to be prepared with any vital sign or heart rhythm information, their current medicines, and a piece of paper and pen handy for any instructions they may receive the day of their visit  4. Inform patient they will receive a phone call 15 minutes prior to their appointment time (may be from unknown caller ID) so they should be prepared to answer  5. Confirm that appointment type is correct in Epic appointment notes (video vs telephone)    TELEPHONE CALL NOTE  [name] has been deemed a candidate for a follow-up tele-health visit to limit community exposure during the Covid-19 pandemic. I spoke with the patient via phone to ensure availability of phone/video source, confirm preferred email & phone number, and discuss  instructions and expectations.  I reminded [name] to be prepared with any vital sign and/or heart rhythm information that could potentially be obtained via home monitoring, at the time of her visit. I reminded [name] to expect a phone call at the time of her visit if her visit.  Did the patient verbally acknowledge consent to treatment?Yes.  [name] 11/26/2018 4:03 PM   DOWNLOADING THE [place], go to [employer] and type in WebEx in the search bar. [place] [employer], the blue/green circle. The app is free but as with any other app downloads, their phone may require them to verify saved payment information or Apple password. The patient does NOT have to create an account.  - If Android, ask patient to go to [employer] and type in WebEx in the search bar. [place] [employer], the blue/green circle. The app is free but as with any other app downloads, their phone may require them to verify saved payment information or Android password. The patient does NOT have to create an account.   CONSENT FOR TELE-HEALTH VISIT - PLEASE REVIEW  I hereby voluntarily request, consent and authorize [facility] and its employed or contracted physicians, physician assistants, nurse practitioners or other licensed health care professionals (the Practitioner), to provide me with telemedicine health care services (the "Services") as deemed necessary by the treating Practitioner.  I acknowledge and consent to receive the Services by the Practitioner via telemedicine. I understand that the telemedicine visit will involve communicating with the Practitioner through live audiovisual communication technology and the disclosure of certain medical information by electronic transmission. I acknowledge that I have been given the opportunity to request an in-person assessment or other available alternative prior to the telemedicine visit and  am voluntarily participating in the telemedicine visit.  I understand that I have the right to withhold or withdraw my consent to the use of telemedicine in the course of my care at any time, without affecting my right to future care or treatment, and that the Practitioner or I may terminate the telemedicine visit at any time. I understand that I have the right to inspect all information obtained and/or recorded in the course of the telemedicine visit and may receive copies of available information for a reasonable fee.  I understand that some of the potential risks of receiving the Services via telemedicine include:  [name] Delay or interruption in medical evaluation due to technological equipment failure or disruption; . Information transmitted may not be sufficient (e.g. poor resolution of images) to allow for appropriate medical decision making by the Practitioner; and/or  . In rare instances, security protocols could fail, causing a breach of personal health information.  Furthermore, I acknowledge that it is my responsibility to provide information about my medical history, conditions and care that is complete and accurate to the best of my ability. I acknowledge that Practitioner's advice, recommendations, and/or decision may be based on factors not within their control, such as incomplete or inaccurate data provided by me or distortions of diagnostic images or specimens that may result from electronic transmissions. I understand that the practice of medicine is not an exact science and that Practitioner makes no warranties or guarantees regarding treatment outcomes. I acknowledge that I will receive a copy of this consent concurrently upon execution via email to the email address I last provided but may also request a printed copy by calling the office of [place].    I understand that my insurance will be billed for this visit.   I have read or had this consent read to me. . I understand the  contents of this consent, which adequately explains the benefits and risks of the Services being provided via telemedicine.  . I have been provided ample opportunity to ask questions regarding this consent and the Services and have had my questions answered to my satisfaction. . I give my informed consent for the services to be provided through the use of telemedicine in my medical care  By participating in this telemedicine visit I agree to the above.

## 2018-11-26 NOTE — Progress Notes (Signed)
Telehealth Visit     Virtual Visit via Telephone Note   This visit type was conducted due to national recommendations for restrictions regarding the COVID-19 Pandemic (e.g. social distancing) in an effort to limit this patient's exposure and mitigate transmission in our community.  Due to her co-morbid illnesses, this patient is at least at moderate risk for complications without adequate follow up.  This format is felt to be most appropriate for this patient at this time.  The patient did not have access to video technology/had technical difficulties with video requiring transitioning to audio format only (telephone).  All issues noted in this document were discussed and addressed.  No physical exam could be performed with this format.  Please refer to the patient's chart for her  consent to telehealth for [facility].   Evaluation Performed:  Follow-up visit  This visit type was conducted due to national recommendations for restrictions regarding the COVID-19 Pandemic (e.g. social distancing).  This format is felt to be most appropriate for this patient at this time.  All issues noted in this document were discussed and addressed.  No physical exam was performed (except for noted visual exam findings with Video Visits).  Please refer to the patient's chart (MyChart message for video visits and phone note for telephone visits) for the patient's consent to telehealth for [facility].  Date:  11/27/2018   ID:  [name], [name] 30-Oct-1940, MRN [id number]  Patient Location:  Home  Provider location:   Home  PCP:  [name], MD  Cardiologist:  [name] Loralie Champagne, MD  Electrophysiologist:  None   Chief Complaint:  5 month check.   History of Present Illness:    [name] is a 78 y.o. female who presents via audio/video conferencing for a telehealth visit today.  Former patient of Dr. Claris Gladden - primarily follows with me.   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 She had an abnormal Myoview back in 2017 - opted for medical management because she did not wish to have cath that would lead to dialysis. She has had prior stroke back in 2018 and then massive GI bleed - required 6 units of PRBCs. Aspirin was stopped.   I saw her at the beginning of October 2019 - she was admitted from the office with chest pain/shortness of breath/failure to thrive as well as new LBBB. She had started dialysis the month before. Given her presentingsymptoms, she underwent afull cardiac work-up including cardiac catheterization which showed extensive two-vessel coronary artery disease with recommendations for continued medical management and secondary prevention. She had an echocardiogram which showed a new decrease in her LV function with an LVEF noted to be 20 to 25%. She was noted to again be quite anemic - required transfusion. She did not wish to have GI work up. Cardiology was consulted during that time and did not feel that this was ischemic in nature.   She presented back to the hospital on 05/26/2017 with worsening shortness of breath however denieschest pain, recent illness, fever, chills, cough, LE swelling, nausea or vomiting, dizziness, presyncope or syncopal episodes.She reports compliance with medications, dialysisand fluid and salt restrictions.In the ED, CXR showed fluid volume overload. On presentation, BNP was noted to be markedly elevated at greater than 4000.She was admitted to hospitalist service given several comorbid conditions for fluid volume  management anddialysistreatment.  Cardiology was once again consulted for evaluation of fluid management.  I then saw her last in November - she was doing ok - not short of breath. No chest pain. She was being referred to [facility] for her fistula. She was off all cardiac medicines. No  aspirin due to recurrent GI bleed.  She was on Midodrine to keep her BP up. Someone had approached the subject of Hospice - she did not wish to have their services.   The patient does not have symptoms concerning for COVID-19 infection (fever, chills, cough, or new shortness of breath).   Seen today via telephone conversation. She has consented for this visit. They declined My Chart visit and preferred telephone call. Husband has gone out to the store. She does not check her BP at home - this is typically done at dialysis. No real concerns noted.  She does her dialysis on M-W-F. Seems to be tolerating for the most part - BP does still drop - she is on Midodrine and takes this while there to offset the hypotension. No chest pain. Her breathing is stable. No cough. She does have some seasonal allergies. Her weight is stable. She has a good appetite. Her husband is her primary caregiver - he has had some of his own health issues however.   Past Medical History:  Diagnosis Date  . Anemia    Pt is taking iron.   [name] Anxiety   . Arthritis   . Carotid stenosis    40-59% bilateral ICA stenosis in 2/12.  . Chronic low back pain   . CKD (chronic kidney disease)    Dr. Audie Clear at [facility]phrology  . Coronary artery disease    Pt presented [facility]/10 to [facility] with NSTEMI and diastolic CHF exacerbation.  LHC was done  3/10 showing 99% pRCA stenosis and 80% calcified pLAD stenosis with L=>R collaterals.  Pt was referred  for CABG which was done by Dr. Prescott Gum with LIMA-LAD, SVG-RCA, SVG-OM.  . Diabetes mellitus   . Diabetic neuropathy ([place])   . Diastolic CHF (HCC)    Echo (2/10) showed EF 55-65%, mild LVH, diastolic dysfunction, mild AS with mean gradient 12 mmHg, PASP 43 mmHg.  Echo (2/12): EF 55-60%, mild LVH, mild AS (mean gradient 12), PA systolic pressure 32 mmHg.     [name] GERD (gastroesophageal reflux disease)   . Heart murmur   . Hyperlipidemia   . Hypertension   . Mild aortic stenosis    mean  gradient 12 mmHg in 2/12.  . Myocardial infarction ([place])    "mild"  . Pneumonia   . PONV (postoperative nausea and vomiting)   . Stroke [facility])    " mild"  . Thrombocytopenia ([place])   . Unsteady gait    Past Surgical History:  Procedure Laterality Date  . AORTIC ARCH ANGIOGRAPHY N/A 10/18/2018   Procedure: AORTIC ARCH ANGIOGRAPHY;  Surgeon: Marty Heck, MD;  Location: [place] CV [facility];  Service: Cardiovascular;  Laterality: N/A;  . AV FISTULA PLACEMENT Left 01/30/2015   Procedure: Creation of a Radial Cephalic AV Fistula left wrist;  Surgeon: [name], MD;  Location: [place];  Service: Vascular;  Laterality: Left;  . AV FISTULA PLACEMENT Left 09/06/2018   Procedure: LEFT ARM ARTERIOVENOUS (AV) FISTULA CREATION;  Surgeon: Marty Heck, MD;  Location: [place];  Service: Vascular;  Laterality: Left;  . BACK SURGERY     multiple  . BREAST SURGERY  biopsy  . CARDIAC CATHETERIZATION    . CATARACT EXTRACTION W/ INTRAOCULAR LENS  IMPLANT, BILATERAL    . COLONOSCOPY Left 11/03/2016   Procedure: COLONOSCOPY;  Surgeon: [name], MD;  Location: [facility] ENDOSCOPY;  Service: Endoscopy;  Laterality: Left;  . COLONOSCOPY W/ BIOPSIES AND POLYPECTOMY    . CORONARY ARTERY BYPASS GRAFT  09/2008   pt with NSTEMI and diastolic CHF exacerbation.  LHC was done  3/10 showing 99% pRCA stenosis and 80% calcified pLAD stenosis with L=>R collaterals.  Pt was referred  for CABG which was done by Dr. Prescott Gum with LIMA-LAD, SVG-RCA, SVG-OM.  [name] ESOPHAGOGASTRODUODENOSCOPY N/A 11/02/2016   Procedure: ESOPHAGOGASTRODUODENOSCOPY (EGD);  Surgeon: [name], MD;  Location: [facility] ENDOSCOPY;  Service: Endoscopy;  Laterality: N/A;  . GIVENS CAPSULE STUDY N/A 11/29/2016   Procedure: GIVENS CAPSULE STUDY;  Surgeon: [name], MD;  Location: [place];  Service: Endoscopy;  Laterality: N/A;  . LIGATION OF ARTERIOVENOUS  FISTULA Left 09/13/2018   Procedure: LIGATION OF LEFT ARTERIOVENOUS  FISTULA;  Surgeon:  Waynetta Sandy, MD;  Location: [place];  Service: Vascular;  Laterality: Left;  . REVISON OF ARTERIOVENOUS FISTULA Left 07/02/2015   Procedure: REVISON OF LEFT RADIOCEPHALIC ARTERIOVENOUS FISTULA;  Surgeon: [name], MD;  Location: [place];  Service: Vascular;  Laterality: Left;  . RIGHT/LEFT HEART CATH AND CORONARY/GRAFT ANGIOGRAPHY N/A 05/18/2018   Procedure: RIGHT/LEFT HEART CATH AND CORONARY/GRAFT ANGIOGRAPHY;  Surgeon: Leonie Man, MD;  Location: [place] CV LAB;  Service: Cardiovascular;  Laterality: N/A;  . UPPER EXTREMITY ANGIOGRAPHY Left 10/18/2018   Procedure: UPPER EXTREMITY ANGIOGRAPHY;  Surgeon: Marty Heck, MD;  Location: [place] CV LAB;  Service: Cardiovascular;  Laterality: Left;     Current Meds  Medication Sig  . acetaminophen (TYLENOL) 500 MG tablet Take 1,000 mg by mouth 3 (three) times daily as needed for moderate pain.  [name] ALPRAZolam (XANAX) 1 MG tablet Take 1 mg by mouth at bedtime.   . AMITIZA 24 MCG capsule Take 24 mcg by mouth 2 (two) times daily as needed for constipation.   [name] b complex-vitamin c-folic acid (NEPHRO-VITE) 0.8 MG TABS tablet Take 1 tablet by mouth daily.  . calcitRIOL (ROCALTROL) 0.5 MCG capsule Take 0.5 mcg by mouth daily.  . diphenhydrAMINE (BENADRYL) 25 MG tablet Take 25 mg by mouth daily as needed for allergies.  . folic acid (FOLVITE) 1 MG tablet Take 1 tablet (1 mg total) by mouth daily.  . midodrine (PROAMATINE) 10 MG tablet Take 10 mg by mouth 3 (three) times a week. Patient takes on dialysis days, M,W,F  . rosuvastatin (CRESTOR) 5 MG tablet Take 1 tablet (5 mg total) by mouth at bedtime.  . sevelamer carbonate (RENVELA) 800 MG tablet Take 1,600 mg 3 (three) times daily with meals by mouth.   . vitamin B-12 (CYANOCOBALAMIN) 500 MCG tablet Take 1 tablet (500 mcg total) by mouth daily.  . [DISCONTINUED] midodrine (PROAMATINE) 10 MG tablet Take 1 tablet (10 mg total) by mouth 2 (two) times daily with a meal. (Patient  taking differently: Take 10 mg by mouth every Monday, Wednesday, and Friday. )     Allergies:   Doxycycline; Lipitor [atorvastatin]; and Strawberry extract   Social History   Tobacco Use  . Smoking status: Former Smoker    Types: Cigarettes  . Smokeless tobacco: Never Used  . Tobacco comment: quit 1988  Substance Use Topics  . Alcohol use: Yes    Comment: daily  . Drug use:  No     Family Hx: The patient's She was adopted. Family history is unknown by patient.  ROS:   Please see the history of present illness.   All other systems reviewed are negative except for none.    Objective:    Vital Signs:  Wt 107 lb (48.5 kg)   BMI 16.27 kg/m    Wt Readings from Last 3 Encounters:  11/27/18 107 lb (48.5 kg)  10/18/18 108 lb (49 kg)  10/16/18 108 lb (49 kg)    Alert female. She is in no acute distress. Not short of breath with conversation. Sounds appropriate.    Labs/Other Tests and Data Reviewed:    Lab Results  Component Value Date   WBC 6.1 06/26/2018   HGB 12.6 10/18/2018   HCT 37.0 10/18/2018   PLT 103 (L) 06/26/2018   GLUCOSE 119 (H) 10/18/2018   CHOL 110 06/26/2018   TRIG 125 06/26/2018   HDL 59 06/26/2018   LDLDIRECT 34.4 11/17/2008   LDLCALC 26 06/26/2018   ALT 14 06/26/2018   AST 20 06/26/2018   NA 138 10/18/2018   K 5.1 10/18/2018   CL 97 06/26/2018   CREATININE 3.90 (H) 10/18/2018   BUN 31 (H) 06/26/2018   CO2 23 06/26/2018   TSH 2.185 01/17/2014   INR 1.07 04/18/2018   HGBA1C 6.0 (H) 05/15/2018     BNP (last 3 results) Recent Labs    05/26/18 2237  BNP 4,003.0*    ProBNP (last 3 results) No results for input(s): PROBNP in the last 8760 hours.    Prior CV studies:    The following studies were reviewed today:  RIGHT/LEFT HEART CATH AND CORONARY/GRAFT ANGIOGRAPHY 05/18/2018  Conclusion     LV end diastolic pressure is moderately elevated.  There is mild-moderate aortic valve stenosis. --Does not meet criteria for low output  aortic stenosis.  Prox RCA lesion is 95% stenosed. Prox RCA to Mid RCA lesion is 95% stenosed. Mid RCA lesion is 100% stenosed -CTO at site of initial subtotal occlusion  SVG-RCA graft was visualized by angiography. Origin lesion is 100% stenosed.  Minimal to mild disease in the circumflex which essentially is a large OM branch.  SVG-CxOM graft was visualized by angiography. Origin lesion is 100% stenosed.  Prox LAD lesion is 99% stenosed with 70% stenosed side branch in Ost 1st Diag. -This represents the only significant new lesion from pre-CABG.  Prox LAD to Mid LAD lesion is 100% stenosed -CTO at original 80% stenosis site.  LIMA-mLAD graft was visualized by angiography and is large. Origin lesion is 40% stenosed.  Heavily calcified left subclavian artery at the takeoff of LIMA and vertebral artery.  Severe two-vessel native CAD with diffuse 95% followed by 100% CTO of the mid RCA (fills via left to right collaterals), and 99% proximal LAD prior to D1 followed by 100% CTO in the mid. Widely patent native Circumflex-OM with mild diffuse disease. Patent LIMA-LAD with heavy calcification in the subclavian artery near the takeoff as well as a proximal calcified 40% stenosis. Chronic total occlusion of SVG-RCA and SVG-OM. --Extent of CAD is essentially not much different than prior to CABG with exception of now CTO of the RCA is post a subtotal occlusion. The only new lesion that was not present on pre-CABG films is a 99% proximal LAD lesion prior to D1. -->Reduction in LVEF is out of proportion with extent of CAD change.  Relatively normal PA pressures with PCWP of 18 and LVEDP of 22.  Probably moderate aortic stenosis with minimal gradient but low output by thermodilution. Conflicting Cardiac Output/Index by Fick and thermodilution. Thermodilution estimates 3.57/2.19, Fick estimates 5.09/3.13.  Recommendation: Continued medical management of cardiomyopathy and volume control with  dialysis. Does not appear to have low output aortic stenosis given the minimal gradient in the Cath Lab. Consider nonischemic etiology for reduced EF.  Recommend Aspirin 81mg  daily for severe CAD.Glenetta Hew, MD   2D Echo 05/16/18 Study Conclusions  - Left ventricle: The cavity size was normal. Wall thickness was increased in a pattern of mild LVH. Systolic function was severely reduced. The estimated ejection fraction was in the range of 20% to 25%. Diffuse hypokinesis with relative preservation of the basal anterior wall and the basal to mid lateral wall. Septal-lateral dyssynychrony. Doppler parameters are consistent with restrictive physiology, indicative of decreased left ventricular diastolic compliance and/or increased left atrial pressure. E/medial e&' >15 suggests LV end diastolic pressure at least 20 mmHg. 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 There was trivial regurgitation. Overall, suspect moderate, low gradient aortic stenosis. Visually does not appear severe (not marked turbulence). Mean gradient (S): 11 mm Hg. Valve area (VTI): 1.01 cm^2. - Mitral valve: Mildly calcified annulus. There was moderate regurgitation. - Left atrium: The atrium was moderately dilated. - Right ventricle: The cavity size was normal. Systolic function was mildly to moderately reduced. - Right atrium: The atrium was moderately dilated. - Tricuspid valve: There was moderate regurgitation. Peak RV-RA gradient (S): 39 mm Hg. - Pulmonary arteries: PA peak pressure: 47 mm Hg (S). - Systemic veins: IVC measured 2.0 cm with < 50% respirophasic variation, suggesting RA pressure 8 mmHg.  Impressions:  - Normal LV size with mild LV hypertrophy. EF 20-25% with wall motion abnormalities as noted above. Restrictive diastolic function. Normal RV size with mild  to moderate systolic dysfunction. Moderate MR, moderate TR. Mild pulmonary hypertension. The aortic valve appeared functionally bicuspid (left and right cusps fused). There was severe calcification. Mean gradient 11 with AVA 1.01 cm^2 by continuity equation. Visually, think low gradient moderate aortic stenosis is most likely.  EchoStudy Conclusions2/2018  - Left ventricle: The cavity size was normal. There was moderate focal basal and mild concentric hypertrophy. Systolic function was normal. The estimated ejection fraction was in the range of 55% to 60%. Wall motion was normal; there were no regional wall motion abnormalities. Features are consistent with a pseudonormal left ventricular filling pattern, with concomitant abnormal relaxation and increased filling pressure (grade 2 diastolic dysfunction). - Aortic valve: The AV appears mildly to moderately stenotic by doppler with a mean AV gradient of 48mmHg. The calculated AVA is 0.9cm2 and likely underestimates with AVA due to inaccurate measurment of the LVOT. Visually there appears to be mild to moderate AS. Valve area (VTI): 0.9 cm^2. Valve area (Vmax): 0.89 cm^2. Valve area (Vmean): 0.86 cm^2. - Mitral valve: There was trivial regurgitation. - Right ventricle: Systolic function was mildly reduced. - Pulmonic valve: There was mild regurgitation. - Pulmonary arteries: PA peak pressure: 33 mm Hg (S).   Myoview Study Highlights 02/2016    Nuclear stress EF: 54%. There is mid inferior wall hypokinesis  There was no ST segment deviation noted during stress.  Defect 1: There is a medium defect of moderate severity present in the basal inferior and mid inferior location.  The mid inferior wall perfusion defect is reversible consistent with ischemia. The basal inferior wall defect is  mostly fixed consistent with infarct.  This is an intermediate risk study. Consider occlusion of SVG to  RCA graft.    Carotid Doppler Summary2/2018: Findings consistent with a high end 1- 39 percent stenosis involving the right internal carotid artery and the left internal carotid artery. Can not rule out higher grade stenosis due to severe amount of shadowing plaque in bilateral carotid arteries and difficulty visualizing vessels. Severe amount of plaque are subjectively suggestive of a 40-59% percent stenosis bilaterally. The right vertebral demonstrates atypical flow, left vertebral is patent and antegrade.   ASSESSMENT & PLAN:    1.CAD - most recent cath noted from October of 2019 - she is managed medically - she remains on basically no cardiac medicines due to BP issues. Not able to tolerate aspirin due to bleeding. She has no active symptoms at this time.   2. 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 Now managed with dialysis.   3. Bicuspid AV with moderate AS noted on recent R heart cath - would plan to consider repeating her echo later this fall.   4.ESRD: followed by nephrology. She remains on dialysis  5. Prior GI bleed: she is not on anticoagulation due to prior bleeding and continued need for transfusion. She has not wished to have GI work up. Not discussed today. Denies bleeding.   6. HTN - no longer an issue - now on Midodrine - unfortunately, this does limit having her on guideline therapy. Her BP is checked only at dialysis.   7. COVID-19 Education: The signs and symptoms of COVID-19 were discussed with the patient and how to seek care for testing (follow up with PCP or arrange E-visit).  The importance of social distancing, staying at home and hand hygiene were discussed today.  Patient Risk:   After full review of this patient's clinical status, I feel that they are at least moderate risk at this time.  Time:   Today, I have spent 10 minutes with the patient with telehealth  technology discussing the above issues.     Medication Adjustments/Labs and Tests Ordered: Current medicines are reviewed at length with the patient today.  Concerns regarding medicines are outlined above.   Tests Ordered: No orders of the defined types were placed in this encounter.   Medication Changes: No orders of the defined types were placed in this encounter.   Disposition:  FU with me in August.     Patient is agreeable to this plan and will call if any problems develop in the interim.   Amie Critchley, NP  11/27/2018 9:07 AM    [place]

## 2018-11-27 ENCOUNTER — Other Ambulatory Visit: Payer: Self-pay

## 2018-11-27 ENCOUNTER — Encounter: Payer: Self-pay | Admitting: Nurse Practitioner

## 2018-11-27 ENCOUNTER — Telehealth (INDEPENDENT_AMBULATORY_CARE_PROVIDER_SITE_OTHER): Payer: Medicare HMO | Admitting: Nurse Practitioner

## 2018-11-27 ENCOUNTER — Other Ambulatory Visit: Payer: Self-pay | Admitting: *Deleted

## 2018-11-27 VITALS — Wt 107.0 lb

## 2018-11-27 DIAGNOSIS — I132 Hypertensive heart and chronic kidney disease with heart failure and with stage 5 chronic kidney disease, or end stage renal disease: Secondary | ICD-10-CM

## 2018-11-27 DIAGNOSIS — I5042 Chronic combined systolic (congestive) and diastolic (congestive) heart failure: Secondary | ICD-10-CM

## 2018-11-27 DIAGNOSIS — I429 Cardiomyopathy, unspecified: Secondary | ICD-10-CM | POA: Diagnosis not present

## 2018-11-27 DIAGNOSIS — I259 Chronic ischemic heart disease, unspecified: Secondary | ICD-10-CM

## 2018-11-27 DIAGNOSIS — Z992 Dependence on renal dialysis: Secondary | ICD-10-CM | POA: Diagnosis not present

## 2018-11-27 DIAGNOSIS — Z7189 Other specified counseling: Secondary | ICD-10-CM

## 2018-11-27 DIAGNOSIS — N186 End stage renal disease: Secondary | ICD-10-CM

## 2018-11-27 DIAGNOSIS — I350 Nonrheumatic aortic (valve) stenosis: Secondary | ICD-10-CM

## 2018-11-27 DIAGNOSIS — I2510 Atherosclerotic heart disease of native coronary artery without angina pectoris: Secondary | ICD-10-CM | POA: Diagnosis not present

## 2018-11-27 DIAGNOSIS — Z8719 Personal history of other diseases of the digestive system: Secondary | ICD-10-CM | POA: Diagnosis not present

## 2018-11-27 DIAGNOSIS — I10 Essential (primary) hypertension: Secondary | ICD-10-CM

## 2018-11-27 DIAGNOSIS — E785 Hyperlipidemia, unspecified: Secondary | ICD-10-CM

## 2018-11-27 DIAGNOSIS — Q231 Congenital insufficiency of aortic valve: Secondary | ICD-10-CM | POA: Diagnosis not present

## 2018-11-27 NOTE — Patient Instructions (Addendum)
After Visit Summary:  We will be checking the following labs today - NONE   Medication Instructions:    Continue with your current medicines.    If you need a refill on your cardiac medications before your next appointment, please call your pharmacy.     Testing/Procedures To Be Arranged:  N/A  Follow-Up:   See me in August - prefers Tuesdays AMs due to dialysis.     At [facility], you and your health needs are our priority.  As part of our continuing mission to provide you with exceptional heart care, we have created designated Provider Care Teams.  These Care Teams include your primary Cardiologist (physician) and Advanced Practice Providers (APPs -  Physician Assistants and Nurse Practitioners) who all work together to provide you with the care you need, when you need it.  Special Instructions:  . Stay safe, stay home and wash your hands for at least 20 seconds! . It was good to talk with you today.   Call the [place] office at [phone] if you have any questions, problems or concerns.

## 2018-12-05 DIAGNOSIS — N186 End stage renal disease: Secondary | ICD-10-CM | POA: Diagnosis not present

## 2018-12-05 DIAGNOSIS — N2581 Secondary hyperparathyroidism of renal origin: Secondary | ICD-10-CM | POA: Diagnosis not present

## 2018-12-05 DIAGNOSIS — D689 Coagulation defect, unspecified: Secondary | ICD-10-CM | POA: Diagnosis not present

## 2018-12-13 DIAGNOSIS — Z992 Dependence on renal dialysis: Secondary | ICD-10-CM | POA: Diagnosis not present

## 2018-12-13 DIAGNOSIS — I129 Hypertensive chronic kidney disease with stage 1 through stage 4 chronic kidney disease, or unspecified chronic kidney disease: Secondary | ICD-10-CM | POA: Diagnosis not present

## 2018-12-13 DIAGNOSIS — N186 End stage renal disease: Secondary | ICD-10-CM | POA: Diagnosis not present

## 2018-12-14 DIAGNOSIS — N2581 Secondary hyperparathyroidism of renal origin: Secondary | ICD-10-CM | POA: Diagnosis not present

## 2018-12-14 DIAGNOSIS — D689 Coagulation defect, unspecified: Secondary | ICD-10-CM | POA: Diagnosis not present

## 2018-12-14 DIAGNOSIS — Z23 Encounter for immunization: Secondary | ICD-10-CM | POA: Diagnosis not present

## 2018-12-14 DIAGNOSIS — N186 End stage renal disease: Secondary | ICD-10-CM | POA: Diagnosis not present

## 2018-12-14 DIAGNOSIS — D631 Anemia in chronic kidney disease: Secondary | ICD-10-CM | POA: Diagnosis not present

## 2019-01-01 DIAGNOSIS — R69 Illness, unspecified: Secondary | ICD-10-CM | POA: Diagnosis not present

## 2019-01-01 DIAGNOSIS — K581 Irritable bowel syndrome with constipation: Secondary | ICD-10-CM | POA: Diagnosis not present

## 2019-01-01 DIAGNOSIS — I770 Arteriovenous fistula, acquired: Secondary | ICD-10-CM | POA: Diagnosis not present

## 2019-01-01 DIAGNOSIS — N186 End stage renal disease: Secondary | ICD-10-CM | POA: Diagnosis not present

## 2019-01-01 DIAGNOSIS — Z0000 Encounter for general adult medical examination without abnormal findings: Secondary | ICD-10-CM | POA: Diagnosis not present

## 2019-01-01 DIAGNOSIS — Z992 Dependence on renal dialysis: Secondary | ICD-10-CM | POA: Diagnosis not present

## 2019-01-04 DIAGNOSIS — N186 End stage renal disease: Secondary | ICD-10-CM | POA: Diagnosis not present

## 2019-01-04 DIAGNOSIS — Z23 Encounter for immunization: Secondary | ICD-10-CM | POA: Diagnosis not present

## 2019-01-04 DIAGNOSIS — N2581 Secondary hyperparathyroidism of renal origin: Secondary | ICD-10-CM | POA: Diagnosis not present

## 2019-01-04 DIAGNOSIS — D689 Coagulation defect, unspecified: Secondary | ICD-10-CM | POA: Diagnosis not present

## 2019-01-04 DIAGNOSIS — D631 Anemia in chronic kidney disease: Secondary | ICD-10-CM | POA: Diagnosis not present

## 2019-01-13 DIAGNOSIS — Z992 Dependence on renal dialysis: Secondary | ICD-10-CM | POA: Diagnosis not present

## 2019-01-13 DIAGNOSIS — I129 Hypertensive chronic kidney disease with stage 1 through stage 4 chronic kidney disease, or unspecified chronic kidney disease: Secondary | ICD-10-CM | POA: Diagnosis not present

## 2019-01-13 DIAGNOSIS — N186 End stage renal disease: Secondary | ICD-10-CM | POA: Diagnosis not present

## 2019-01-14 DIAGNOSIS — N2581 Secondary hyperparathyroidism of renal origin: Secondary | ICD-10-CM | POA: Diagnosis not present

## 2019-01-14 DIAGNOSIS — D689 Coagulation defect, unspecified: Secondary | ICD-10-CM | POA: Diagnosis not present

## 2019-01-14 DIAGNOSIS — N186 End stage renal disease: Secondary | ICD-10-CM | POA: Diagnosis not present

## 2019-01-14 DIAGNOSIS — E611 Iron deficiency: Secondary | ICD-10-CM | POA: Diagnosis not present

## 2019-02-04 DIAGNOSIS — E611 Iron deficiency: Secondary | ICD-10-CM | POA: Diagnosis not present

## 2019-02-04 DIAGNOSIS — N186 End stage renal disease: Secondary | ICD-10-CM | POA: Diagnosis not present

## 2019-02-04 DIAGNOSIS — N2581 Secondary hyperparathyroidism of renal origin: Secondary | ICD-10-CM | POA: Diagnosis not present

## 2019-02-04 DIAGNOSIS — D689 Coagulation defect, unspecified: Secondary | ICD-10-CM | POA: Diagnosis not present

## 2019-02-12 DIAGNOSIS — N186 End stage renal disease: Secondary | ICD-10-CM | POA: Diagnosis not present

## 2019-02-12 DIAGNOSIS — Z992 Dependence on renal dialysis: Secondary | ICD-10-CM | POA: Diagnosis not present

## 2019-02-12 DIAGNOSIS — I129 Hypertensive chronic kidney disease with stage 1 through stage 4 chronic kidney disease, or unspecified chronic kidney disease: Secondary | ICD-10-CM | POA: Diagnosis not present

## 2019-02-13 DIAGNOSIS — N186 End stage renal disease: Secondary | ICD-10-CM | POA: Diagnosis not present

## 2019-02-13 DIAGNOSIS — D631 Anemia in chronic kidney disease: Secondary | ICD-10-CM | POA: Diagnosis not present

## 2019-02-13 DIAGNOSIS — E611 Iron deficiency: Secondary | ICD-10-CM | POA: Diagnosis not present

## 2019-02-13 DIAGNOSIS — N2581 Secondary hyperparathyroidism of renal origin: Secondary | ICD-10-CM | POA: Diagnosis not present

## 2019-02-13 DIAGNOSIS — D689 Coagulation defect, unspecified: Secondary | ICD-10-CM | POA: Diagnosis not present

## 2019-03-05 DIAGNOSIS — H6121 Impacted cerumen, right ear: Secondary | ICD-10-CM | POA: Diagnosis not present

## 2019-03-05 DIAGNOSIS — H6041 Cholesteatoma of right external ear: Secondary | ICD-10-CM | POA: Diagnosis not present

## 2019-03-05 DIAGNOSIS — H7291 Unspecified perforation of tympanic membrane, right ear: Secondary | ICD-10-CM | POA: Diagnosis not present

## 2019-03-06 DIAGNOSIS — D631 Anemia in chronic kidney disease: Secondary | ICD-10-CM | POA: Diagnosis not present

## 2019-03-06 DIAGNOSIS — D689 Coagulation defect, unspecified: Secondary | ICD-10-CM | POA: Diagnosis not present

## 2019-03-06 DIAGNOSIS — N2581 Secondary hyperparathyroidism of renal origin: Secondary | ICD-10-CM | POA: Diagnosis not present

## 2019-03-06 DIAGNOSIS — E611 Iron deficiency: Secondary | ICD-10-CM | POA: Diagnosis not present

## 2019-03-06 DIAGNOSIS — N186 End stage renal disease: Secondary | ICD-10-CM | POA: Diagnosis not present

## 2019-03-15 DIAGNOSIS — Z992 Dependence on renal dialysis: Secondary | ICD-10-CM | POA: Diagnosis not present

## 2019-03-15 DIAGNOSIS — I129 Hypertensive chronic kidney disease with stage 1 through stage 4 chronic kidney disease, or unspecified chronic kidney disease: Secondary | ICD-10-CM | POA: Diagnosis not present

## 2019-03-15 DIAGNOSIS — N186 End stage renal disease: Secondary | ICD-10-CM | POA: Diagnosis not present

## 2019-03-18 DIAGNOSIS — T8249XA Other complication of vascular dialysis catheter, initial encounter: Secondary | ICD-10-CM | POA: Diagnosis not present

## 2019-03-18 DIAGNOSIS — E611 Iron deficiency: Secondary | ICD-10-CM | POA: Diagnosis not present

## 2019-03-18 DIAGNOSIS — D689 Coagulation defect, unspecified: Secondary | ICD-10-CM | POA: Diagnosis not present

## 2019-03-18 DIAGNOSIS — N2581 Secondary hyperparathyroidism of renal origin: Secondary | ICD-10-CM | POA: Diagnosis not present

## 2019-03-18 DIAGNOSIS — Z992 Dependence on renal dialysis: Secondary | ICD-10-CM | POA: Diagnosis not present

## 2019-03-18 DIAGNOSIS — D631 Anemia in chronic kidney disease: Secondary | ICD-10-CM | POA: Diagnosis not present

## 2019-03-18 DIAGNOSIS — N186 End stage renal disease: Secondary | ICD-10-CM | POA: Diagnosis not present

## 2019-03-18 DIAGNOSIS — Z23 Encounter for immunization: Secondary | ICD-10-CM | POA: Diagnosis not present

## 2019-03-29 NOTE — Progress Notes (Deleted)
CARDIOLOGY OFFICE NOTE  Date:  03/29/2019    [name] Date of Birth: 06-15-1941 Medical Record [id number]  PCP:  Jeanne Sacramento, MD  Cardiologist:  [name] chief complaint on file.   History of Present Illness: [name] is a 78 y.o. female who presents today for a 4 month check. Former patient of Dr. Claris [name] - primarily follows with me.   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 She had an abnormal Myoview back in 2017 - opted for medical management because she did not wish to have cath that would lead to dialysis. She has had prior stroke back in 2018 and then massive GI bleed - required 6 units of PRBCs. Aspirin was stopped.   I saw her at the beginning of October 2019 - she was admitted from the office with chest pain/shortness of breath/failure to thrive as well as new LBBB. She had started dialysis the month before.Givenher presentingsymptoms, she underwent afull cardiac work-up including cardiac catheterization which showed extensive two-vessel coronary artery disease with recommendations for continued medical management and secondary prevention. She had an echocardiogram which showeda new decrease in her LV function with an LVEF noted to be 20 to 25%. She was noted to again be quite anemic - required transfusion. She did not wish to have GI work up. Cardiology was consulted during that time and did not feel that this was ischemic in nature.   She presented back to the hospital on 05/26/2018 with worsening shortness of breath however denieschest pain, recent illness, fever, chills, cough, LE swelling, nausea or vomiting, dizziness, presyncope or syncopal episodes.She reports compliance with medications, dialysisand fluid and salt restrictions.In the ED, CXR showed fluid volume overload. On presentation, BNP was noted  to be markedly elevated at greater than 4000.She was admitted to hospitalist service given several comorbid conditions for fluid volume management anddialysistreatment. Cardiology was once again consulted for evaluation of fluid management.  I then saw her last in November 2019 - she was doing ok - not short of breath. No chest pain. She was being referred to [facility] for her fistula. She was off all cardiac medicines. No aspirin due to recurrent GI bleed.  She was on Midodrine to keep her BP up. Someone had approached the subject of Hospice - she did not wish to have their services.   Last seen by telehealth visit back in April of 2020 - she was doing ok. Tolerating her dialysis for the most part. Remained on Midodrine for her orthostasis. No chest pain.   The patient {does/does not:200015} have symptoms concerning for COVID-19 infection (fever, chills, cough, or new shortness of breath).   Comes in today. Here with   Past Medical History:  Diagnosis Date  . Anemia    Pt is taking iron.   [name] Anxiety   . Arthritis   . Carotid stenosis    40-59% bilateral ICA stenosis in 2/12.  . Chronic low back pain   . CKD (chronic kidney disease)    [name] at [facility]  . Coronary artery disease    Pt presented 2/10 to [facility] with NSTEMI and diastolic CHF exacerbation.  LHC was done  3/10 showing 99% pRCA stenosis and 80% calcified pLAD stenosis with L=>R collaterals.  Pt was referred  for CABG which was done by Dr. Lucianne Lei  [name] with LIMA-LAD, SVG-RCA, SVG-OM.  . Diabetes mellitus   . Diabetic neuropathy ([name])   . Diastolic CHF (HCC)    Echo (2/10) showed EF 55-65%, mild LVH, diastolic dysfunction, mild AS with mean gradient 12 mmHg, PASP 43 mmHg.  Echo (2/12): EF 55-60%, mild LVH, mild AS (mean gradient 12), PA systolic pressure 32 mmHg.     [name] GERD (gastroesophageal reflux disease)   . Heart murmur   . Hyperlipidemia   . Hypertension   . Mild aortic stenosis    mean gradient 12  mmHg in 2/12.  . Myocardial infarction ([name])    "mild"  . Pneumonia   . PONV (postoperative nausea and vomiting)   . Stroke [facility])    " mild"  . Thrombocytopenia ([name])   . Unsteady gait     Past Surgical History:  Procedure Laterality Date  . AORTIC ARCH ANGIOGRAPHY N/A 10/18/2018   Procedure: AORTIC ARCH ANGIOGRAPHY;  Surgeon: Jeanne Heck, MD;  Location: [place] [facility];  Service: Cardiovascular;  Laterality: N/A;  . AV FISTULA PLACEMENT Left 01/30/2015   Procedure: Creation of a Radial Cephalic AV Fistula left wrist;  Surgeon: Jeanne Misty, MD;  Location: [place];  Service: Vascular;  Laterality: Left;  . AV FISTULA PLACEMENT Left 09/06/2018   Procedure: LEFT ARM ARTERIOVENOUS (AV) FISTULA CREATION;  Surgeon: Jeanne Heck, MD;  Location: [place];  Service: Vascular;  Laterality: Left;  . BACK SURGERY     multiple  . BREAST SURGERY     biopsy  . CARDIAC CATHETERIZATION    . CATARACT EXTRACTION W/ INTRAOCULAR LENS  IMPLANT, BILATERAL    . COLONOSCOPY Left 11/03/2016   Procedure: COLONOSCOPY;  Surgeon: Jeanne Ada, MD;  Location: [facility];  Service: Endoscopy;  Laterality: Left;  . COLONOSCOPY W/ BIOPSIES AND POLYPECTOMY    . CORONARY ARTERY BYPASS GRAFT  09/2008   pt with NSTEMI and diastolic CHF exacerbation.  LHC was done  3/10 showing 99% pRCA stenosis and 80% calcified pLAD stenosis with L=>R collaterals.  Pt was referred  for CABG which was done by Dr. Prescott [name] with LIMA-LAD, SVG-RCA, SVG-OM.  [name] ESOPHAGOGASTRODUODENOSCOPY N/A 11/02/2016   Procedure: ESOPHAGOGASTRODUODENOSCOPY (EGD);  Surgeon: Jeanne Craver, MD;  Location: [facility] ENDOSCOPY;  Service: Endoscopy;  Laterality: N/A;  . GIVENS CAPSULE STUDY N/A 11/29/2016   Procedure: GIVENS CAPSULE STUDY;  Surgeon: Jeanne Craver, MD;  Location: [place];  Service: Endoscopy;  Laterality: N/A;  . LIGATION OF ARTERIOVENOUS  FISTULA Left 09/13/2018   Procedure: LIGATION OF LEFT ARTERIOVENOUS  FISTULA;  Surgeon: Jeanne Sandy, MD;  Location: [place];  Service: Vascular;  Laterality: Left;  . REVISON OF ARTERIOVENOUS FISTULA Left 07/02/2015   Procedure: REVISON OF LEFT RADIOCEPHALIC ARTERIOVENOUS FISTULA;  Surgeon: Jeanne Misty, MD;  Location: [place];  Service: Vascular;  Laterality: Left;  . RIGHT/LEFT HEART CATH AND CORONARY/GRAFT ANGIOGRAPHY N/A 05/18/2018   Procedure: RIGHT/LEFT HEART CATH AND CORONARY/GRAFT ANGIOGRAPHY;  Surgeon: Leonie Man, MD;  Location: [place] [facility];  Service: Cardiovascular;  Laterality: N/A;  . UPPER EXTREMITY ANGIOGRAPHY Left 10/18/2018   Procedure: UPPER EXTREMITY ANGIOGRAPHY;  Surgeon: Jeanne Heck, MD;  Location: [place] [facility];  Service: Cardiovascular;  Laterality: Left;     Medications: No outpatient medications have been marked as taking for the 04/02/19 encounter (Appointment) with [name], NP.     Allergies: Allergies  Allergen Reactions  . Doxycycline Nausea And Vomiting    Caused "DEATHLY  NAUSEA AND VOMITING"  . Lipitor [Atorvastatin] Other (See Comments)    Stomach pain  . Strawberry Extract Rash    Social History: The patient  reports that she has quit smoking. Her smoking use included cigarettes. She has never used smokeless tobacco. She reports current alcohol use. She reports that she does not use drugs.   Family History: The patient's ***She was adopted. Family history is unknown by patient.   Review of Systems: Please see the history of present illness.   All other systems are reviewed and negative.   Physical Exam: VS:  There were no vitals taken for this visit. [name]  BMI There is no height or weight on file to calculate BMI.  Wt Readings from Last 3 Encounters:  11/27/18 107 lb (48.5 kg)  10/18/18 108 lb (49 kg)  10/16/18 108 lb (49 kg)    General: Pleasant. Well developed, well nourished and in no acute distress.   HEENT: Normal.  Neck: Supple, no JVD, carotid bruits, or masses noted.  Cardiac: ***Regular  rate and rhythm. No murmurs, rubs, or gallops. No edema.  Respiratory:  Lungs are [name] to auscultation bilaterally with normal work of breathing.  GI: Soft and nontender.  MS: No deformity or atrophy. Gait and ROM intact.  Skin: Warm and dry. Color is normal.  Neuro:  Strength and sensation are intact and no gross focal deficits noted.  Psych: Alert, appropriate and with normal affect.   LABORATORY DATA:  EKG:  EKG {ACTION; IS/IS [id number] ordered today. This demonstrates ***.  Lab Results  Component Value Date   WBC 6.1 06/26/2018   HGB 12.6 10/18/2018   HCT 37.0 10/18/2018   PLT 103 (L) 06/26/2018   GLUCOSE 119 (H) 10/18/2018   CHOL 110 06/26/2018   TRIG 125 06/26/2018   HDL 59 06/26/2018   LDLDIRECT 34.4 11/17/2008   LDLCALC 26 06/26/2018   ALT 14 06/26/2018   AST 20 06/26/2018   NA 138 10/18/2018   K 5.1 10/18/2018   CL 97 06/26/2018   CREATININE 3.90 (H) 10/18/2018   BUN 31 (H) 06/26/2018   CO2 23 06/26/2018   TSH 2.185 01/17/2014   INR 1.07 04/18/2018   HGBA1C 6.0 (H) 05/15/2018     BNP (last 3 results) Recent Labs    05/26/18 2237  BNP 4,003.0*    ProBNP (last 3 results) No results for input(s): PROBNP in the last 8760 hours.   Other Studies Reviewed Today:  RIGHT/LEFT HEART CATH AND CORONARY/GRAFT ANGIOGRAPHY10/11/2017  Conclusion     LV end diastolic pressure is moderately elevated.  There is mild-moderate aortic valve stenosis. --Does not meet criteria for low output aortic stenosis.  Prox RCA lesion is 95% stenosed. Prox RCA to Mid RCA lesion is 95% stenosed. Mid RCA lesion is 100% stenosed -CTO at site of initial subtotal occlusion  SVG-RCA graft was visualized by angiography. Origin lesion is 100% stenosed.  Minimal to mild disease in the circumflex which essentially is a large OM branch.  SVG-CxOM graft was visualized by angiography. Origin lesion is 100% stenosed.  Prox LAD lesion is 99% stenosed with 70% stenosed side branch  in Ost 1st Diag. -This represents the only significant new lesion from pre-CABG.  Prox LAD to Mid LAD lesion is 100% stenosed -CTO at original 80% stenosis site.  LIMA-mLAD graft was visualized by angiography and is large. Origin lesion is 40% stenosed.  Heavily calcified left subclavian artery at the takeoff of LIMA and vertebral artery.  Severe two-vessel  native CAD with diffuse 95% followed by 100% CTO of the mid RCA (fills via left to right collaterals), and 99% proximal LAD prior to D1 followed by 100% CTO in the mid. Widely patent native Circumflex-OM with mild diffuse disease. Patent LIMA-LAD with heavy calcification in the subclavian artery near the takeoff as well as a proximal calcified 40% stenosis. Chronic total occlusion of SVG-RCA and SVG-OM. --Extent of CAD is essentially not much different than prior to CABG with exception of now CTO of the RCA is post a subtotal occlusion. The only new lesion that was not present on pre-CABG films is a 99% proximal LAD lesion prior to D1. -->Reduction in LVEF is out of proportion with extent of CAD change.  Relatively normal PA pressures with PCWP of 18 and LVEDP of 22. Probably moderate aortic stenosis with minimal gradient but low output by thermodilution. Conflicting Cardiac Output/Index by Fick and thermodilution. Thermodilution estimates 3.57/2.19, Fick estimates 5.09/3.13.  Recommendation: Continued medical management of cardiomyopathy and volume control with dialysis. Does not appear to have low output aortic stenosis given the minimal gradient in the Cath Lab. Consider nonischemic etiology for reduced EF.  Recommend Aspirin 81mg  daily for severe CAD.Glenetta Hew, MD   2D Echo 05/16/18 Study Conclusions  - Left ventricle: The cavity size was normal. Wall thickness was increased in a pattern of mild LVH. Systolic function was severely reduced. The estimated ejection fraction was in the range of 20% to 25%.  Diffuse hypokinesis with relative preservation of the basal anterior wall and the basal to mid lateral wall. Septal-lateral dyssynychrony. Doppler parameters are consistent with restrictive physiology, indicative of decreased left ventricular diastolic compliance and/or increased left atrial pressure. E/medial e&' >15 suggests LV end diastolic pressure at least 20 mmHg. 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 There was trivial regurgitation.Overall, suspect moderate, low gradient aortic stenosis.Visually does not appear severe (not marked turbulence). Mean gradient (S): 11 mm Hg. Valve area (VTI): 1.01 cm^2. - Mitral valve: Mildly calcified annulus. There was moderate regurgitation. - Left atrium: The atrium was moderately dilated. - Right ventricle: The cavity size was normal. Systolic function was mildly to moderately reduced. - Right atrium: The atrium was moderately dilated. - Tricuspid valve: There was moderate regurgitation. Peak RV-RA gradient (S): 39 mm Hg. - Pulmonary arteries: PA peak pressure: 47 mm Hg (S). - Systemic veins: IVC measured 2.0 cm with < 50% respirophasic variation, suggesting RA pressure 8 mmHg.  Impressions:  - Normal LV size with mild LV hypertrophy. EF 20-25% with wall motion abnormalities as noted above. Restrictive diastolic function. Normal RV size with mild to moderate systolic dysfunction. Moderate MR, moderate TR. Mild pulmonary hypertension. The aortic valve appeared functionally bicuspid (left and right cusps fused). There was severe calcification. Mean gradient 11 with AVA 1.01 cm^2 by continuity equation. Visually, think low gradient moderate aortic stenosis is most likely.  EchoStudy Conclusions2/2018  - Left ventricle: The cavity size was normal. There was moderate focal basal and mild concentric  hypertrophy. Systolic function was normal. The estimated ejection fraction was in the range of 55% to 60%. Wall motion was normal; there were no regional wall motion abnormalities. Features are consistent with a pseudonormal left ventricular filling pattern, with concomitant abnormal relaxation and increased filling pressure (grade 2 diastolic dysfunction). - Aortic valve: The AV appears mildly to moderately stenotic by doppler with a mean AV gradient of 50mmHg. The calculated AVA is  0.9cm2 and likely underestimates with AVA due to inaccurate measurment of the LVOT. Visually there appears to be mild to moderate AS. Valve area (VTI): 0.9 cm^2. Valve area (Vmax): 0.89 cm^2. Valve area (Vmean): 0.86 cm^2. - Mitral valve: There was trivial regurgitation. - Right ventricle: Systolic function was mildly reduced. - Pulmonic valve: There was mild regurgitation. - Pulmonary arteries: PA peak pressure: 33 mm Hg (S).   Myoview Study Highlights 02/2016    Nuclear stress EF: 54%. There is mid inferior wall hypokinesis  There was no ST segment deviation noted during stress.  Defect 1: There is a medium defect of moderate severity present in the basal inferior and mid inferior location.  The mid inferior wall perfusion defect is reversible consistent with ischemia. The basal inferior wall defect is mostly fixed consistent with infarct.  This is an intermediate risk study. Consider occlusion of SVG to RCA graft.    Carotid Doppler Summary2/2018: Findings consistent with a high end 1- 39 percent stenosis involving the right internal carotid artery and the left internal carotid artery. Can not rule out higher grade stenosis due to severe amount of shadowing plaque in bilateral carotid arteries and difficulty visualizing vessels. Severe amount of plaque are subjectively suggestive of a 40-59% percent stenosis bilaterally. The right vertebral demonstrates atypical  flow, left vertebral is patent and antegrade.   ASSESSMENT & PLAN:    1.CAD - most recent cath noted from October of 2019 - she is managed medically - she remains on basically no cardiac medicines due to BP issues. Not able to tolerate aspirin due to bleeding. She has no active symptoms at this time.   2. 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 Now managed with dialysis.   3. Bicuspid AV with moderate AS noted on recent R heart cath - would plan to consider repeating her echo later this fall.   4.ESRD:followed by nephrology. She remains on dialysis  5. Prior GI bleed:she is not on anticoagulation due to prior bleeding and continued need for transfusion. She has not wished to have GI work up. Not discussed today. Denies bleeding.   6. HTN - no longer an issue - now on Midodrine - unfortunately, this does limit having her on guideline therapy. Her BP is checked only at dialysis.   7. COVID-19 Education: The signs and symptoms of COVID-19 were discussed with the patient and how to seek care for testing (follow up with PCP or arrange E-visit).  The importance of social distancing, staying at home, hand hygiene and wearing a mask when out in public were discussed today.  Current medicines are reviewed with the patient today.  The patient does not have concerns regarding medicines other than what has been noted above.  The following changes have been made:  See above.  Labs/ tests ordered today include:   No orders of the defined types were placed in this encounter.    Disposition:   FU with *** in {gen number [id number] {Days to years:10300}.   Patient is agreeable to this plan and will call if any problems develop in the interim.   SignedTruitt Merle, NP  03/29/2019 5:05 PM  [facility] [address] [place], [place]  [address] Phone: [phone] Fax: [phone]

## 2019-04-02 ENCOUNTER — Ambulatory Visit: Payer: Medicare HMO | Admitting: Nurse Practitioner

## 2019-04-05 DIAGNOSIS — T8249XA Other complication of vascular dialysis catheter, initial encounter: Secondary | ICD-10-CM | POA: Diagnosis not present

## 2019-04-05 DIAGNOSIS — Z23 Encounter for immunization: Secondary | ICD-10-CM | POA: Diagnosis not present

## 2019-04-05 DIAGNOSIS — D689 Coagulation defect, unspecified: Secondary | ICD-10-CM | POA: Diagnosis not present

## 2019-04-05 DIAGNOSIS — N186 End stage renal disease: Secondary | ICD-10-CM | POA: Diagnosis not present

## 2019-04-05 DIAGNOSIS — Z992 Dependence on renal dialysis: Secondary | ICD-10-CM | POA: Diagnosis not present

## 2019-04-05 DIAGNOSIS — E611 Iron deficiency: Secondary | ICD-10-CM | POA: Diagnosis not present

## 2019-04-05 DIAGNOSIS — N2581 Secondary hyperparathyroidism of renal origin: Secondary | ICD-10-CM | POA: Diagnosis not present

## 2019-04-05 DIAGNOSIS — D631 Anemia in chronic kidney disease: Secondary | ICD-10-CM | POA: Diagnosis not present

## 2019-04-15 DIAGNOSIS — I129 Hypertensive chronic kidney disease with stage 1 through stage 4 chronic kidney disease, or unspecified chronic kidney disease: Secondary | ICD-10-CM | POA: Diagnosis not present

## 2019-04-15 DIAGNOSIS — Z992 Dependence on renal dialysis: Secondary | ICD-10-CM | POA: Diagnosis not present

## 2019-04-15 DIAGNOSIS — N186 End stage renal disease: Secondary | ICD-10-CM | POA: Diagnosis not present

## 2019-04-17 DIAGNOSIS — D689 Coagulation defect, unspecified: Secondary | ICD-10-CM | POA: Diagnosis not present

## 2019-04-17 DIAGNOSIS — N186 End stage renal disease: Secondary | ICD-10-CM | POA: Diagnosis not present

## 2019-04-17 DIAGNOSIS — N2581 Secondary hyperparathyroidism of renal origin: Secondary | ICD-10-CM | POA: Diagnosis not present

## 2019-04-17 DIAGNOSIS — Z23 Encounter for immunization: Secondary | ICD-10-CM | POA: Diagnosis not present

## 2019-04-17 DIAGNOSIS — Z992 Dependence on renal dialysis: Secondary | ICD-10-CM | POA: Diagnosis not present

## 2019-05-08 DIAGNOSIS — N2581 Secondary hyperparathyroidism of renal origin: Secondary | ICD-10-CM | POA: Diagnosis not present

## 2019-05-08 DIAGNOSIS — Z23 Encounter for immunization: Secondary | ICD-10-CM | POA: Diagnosis not present

## 2019-05-08 DIAGNOSIS — N186 End stage renal disease: Secondary | ICD-10-CM | POA: Diagnosis not present

## 2019-05-08 DIAGNOSIS — D689 Coagulation defect, unspecified: Secondary | ICD-10-CM | POA: Diagnosis not present

## 2019-05-08 DIAGNOSIS — Z992 Dependence on renal dialysis: Secondary | ICD-10-CM | POA: Diagnosis not present

## 2019-05-10 NOTE — Progress Notes (Signed)
CARDIOLOGY OFFICE NOTE  Date:  05/14/2019    [name] Date of Birth: March 10, 1941 Medical Record [id number]  PCP:  [name], MD  Cardiologist:  [name]  Chief Complaint  Patient presents with   Follow-up    History of Present Illness: [name] is a 78 y.o. female who presents today for a 5 month check. Former patient of Dr. Claris Gladden - primarily follows with me.   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 She had an abnormal Myoview back in 2017 - opted for medical management because she did not wish to have cath that would lead to dialysis. She has had prior stroke back in 2018 and then massive GI bleed - required 6 units of PRBCs. Aspirin was stopped.   I saw her at the beginning of October 2019 - she was admitted from the office with chest pain/shortness of breath/failure to thrive as well as new LBBB. She had started dialysis the month before.Givenher presentingsymptoms, she underwent afull cardiac work-up including cardiac catheterization which showed extensive two-vessel coronary artery disease with recommendations for continued medical management and secondary prevention. She had an echocardiogram which showeda new decrease in her LV function with an LVEF noted to be 20 to 25%. She was noted to again be quite anemic - required transfusion. She did not wish to have GI work up. Cardiology was consulted during that time and did not feel that this was ischemic in nature.   She presented back to the hospital on 05/26/2017 with worsening shortness of breath however denieschest pain, recent illness, fever, chills, cough, LE swelling, nausea or vomiting, dizziness, presyncope or syncopal episodes.She reports compliance with medications, dialysisand fluid and salt restrictions.In the ED, CXR showed fluid volume overload. On  presentation, BNP was noted to be markedly elevated at greater than 4000.She was admitted to hospitalist service given several comorbid conditions for fluid volume management anddialysistreatment. Cardiology was once again consulted for evaluation of fluid management.  I then saw her last in the office last November - she was doing ok - not short of breath. No chest pain. She was being referred to [facility] for her fistula. She was off all cardiac medicines. No aspirin due to recurrent GI bleed.  She was on Midodrine to keep her BP up. Someone had approached the subject of Hospice - she did not wish to have their services. We then did a telehealth visit back in April - she was doing ok - tolerating her dialysis for the most part - requiring Midodrine for low BP. No chest pain noted.   The patient does not have symptoms concerning for COVID-19 infection (fever, chills, cough, or new shortness of breath).   Comes in today. Here with her husband. She feels like she is doing ok. Says dialysis is "kicking my butt" but overall tolerating. She uses Midodrine on her dialysis days. She has been able to gain a little weight. No chest pain. Breathing is ok. She is happy with how she is doing. Labs checked by [facility].   Past Medical History:  Diagnosis Date   Anemia    Pt is taking iron.    Anxiety    Arthritis    Carotid stenosis    40-59% bilateral ICA stenosis in 2/12.   Chronic low back pain    CKD (chronic kidney disease)  Dr. Audie Clear at [facility]   Coronary artery disease    Pt presented 2/10 to [facility] with NSTEMI and diastolic CHF exacerbation.  LHC was done  3/10 showing 99% pRCA stenosis and 80% calcified pLAD stenosis with L=>R collaterals.  Pt was referred  for CABG which was done by Dr. Prescott Gum with LIMA-LAD, SVG-RCA, SVG-OM.   Diabetes mellitus    Diabetic neuropathy (HCC)    Diastolic CHF (HCC)    Echo (2/10) showed EF 55-65%, mild LVH, diastolic dysfunction,  mild AS with mean gradient 12 mmHg, PASP 43 mmHg.  Echo (2/12): EF 55-60%, mild LVH, mild AS (mean gradient 12), PA systolic pressure 32 mmHg.      GERD (gastroesophageal reflux disease)    Heart murmur    Hyperlipidemia    Hypertension    Mild aortic stenosis    mean gradient 12 mmHg in 2/12.   Myocardial infarction ([place])    "mild"   Pneumonia    PONV (postoperative nausea and vomiting)    Stroke ([place])    " mild"   Thrombocytopenia ([place])    Unsteady gait     Past Surgical History:  Procedure Laterality Date   AORTIC ARCH ANGIOGRAPHY N/A 10/18/2018   Procedure: AORTIC ARCH ANGIOGRAPHY;  Surgeon: Marty Heck, MD;  Location: [place] [facility];  Service: Cardiovascular;  Laterality: N/A;   AV FISTULA PLACEMENT Left 01/30/2015   Procedure: Creation of a Radial Cephalic AV Fistula left wrist;  Surgeon: [name], MD;  Location: [place];  Service: Vascular;  Laterality: Left;   AV FISTULA PLACEMENT Left 09/06/2018   Procedure: LEFT ARM ARTERIOVENOUS (AV) FISTULA CREATION;  Surgeon: Marty Heck, MD;  Location: [place];  Service: Vascular;  Laterality: Left;   BACK SURGERY     multiple   BREAST SURGERY     biopsy   CARDIAC CATHETERIZATION     CATARACT EXTRACTION W/ INTRAOCULAR LENS  IMPLANT, BILATERAL     COLONOSCOPY Left 11/03/2016   Procedure: COLONOSCOPY;  Surgeon: [name], MD;  Location: [place];  Service: Endoscopy;  Laterality: Left;   COLONOSCOPY W/ BIOPSIES AND POLYPECTOMY     CORONARY ARTERY BYPASS GRAFT  09/2008   pt with NSTEMI and diastolic CHF exacerbation.  LHC was done  3/10 showing 99% pRCA stenosis and 80% calcified pLAD stenosis with L=>R collaterals.  Pt was referred  for CABG which was done by Dr. Prescott Gum with LIMA-LAD, SVG-RCA, SVG-OM.   ESOPHAGOGASTRODUODENOSCOPY N/A 11/02/2016   Procedure: ESOPHAGOGASTRODUODENOSCOPY (EGD);  Surgeon: [name], MD;  [facility];  Service: Endoscopy;  Laterality: N/A;    GIVENS CAPSULE STUDY N/A 11/29/2016   Procedure: GIVENS CAPSULE STUDY;  Surgeon: [name], MD;  Location: [place];  Service: Endoscopy;  Laterality: N/A;   LIGATION OF ARTERIOVENOUS  FISTULA Left 09/13/2018   Procedure: LIGATION OF LEFT ARTERIOVENOUS  FISTULA;  Surgeon: Waynetta Sandy, MD;  Location: [place];  Service: Vascular;  Laterality: Left;   REVISON OF ARTERIOVENOUS FISTULA Left 07/02/2015   Procedure: REVISON OF LEFT RADIOCEPHALIC ARTERIOVENOUS FISTULA;  Surgeon: [name], MD;  Location: [place];  Service: Vascular;  Laterality: Left;   RIGHT/LEFT HEART CATH AND CORONARY/GRAFT ANGIOGRAPHY N/A 05/18/2018   Procedure: RIGHT/LEFT HEART CATH AND CORONARY/GRAFT ANGIOGRAPHY;  Surgeon: Leonie Man, MD;  Location: [place] [facility];  Service: Cardiovascular;  Laterality: N/A;   UPPER EXTREMITY ANGIOGRAPHY Left 10/18/2018   Procedure: UPPER EXTREMITY ANGIOGRAPHY;  Surgeon: Marty Heck,  MD;  Location: [place] [facility];  Service: Cardiovascular;  Laterality: Left;     Medications: Current Meds  Medication Sig   acetaminophen (TYLENOL) 500 MG tablet Take 1,000 mg by mouth 3 (three) times daily as needed for moderate pain.   ALPRAZolam (XANAX) 1 MG tablet Take 1 mg by mouth at bedtime.    AMITIZA 24 MCG capsule Take 24 mcg by mouth 2 (two) times daily as needed for constipation.    b complex-vitamin c-folic acid (NEPHRO-VITE) 0.8 MG TABS tablet Take 1 tablet by mouth daily.   calcitRIOL (ROCALTROL) 0.5 MCG capsule Take 0.5 mcg by mouth daily.   diphenhydrAMINE (BENADRYL) 25 MG tablet Take 25 mg by mouth daily as needed for allergies.   folic acid (FOLVITE) 1 MG tablet Take 1 tablet (1 mg total) by mouth daily.   midodrine (PROAMATINE) 10 MG tablet Take 10 mg by mouth 3 (three) times a week. Patient takes on dialysis days, M,W,F   rosuvastatin (CRESTOR) 5 MG tablet Take 1 tablet (5 mg total) by mouth at bedtime.   sevelamer carbonate (RENVELA) 800 MG  tablet Take 1,600 mg 3 (three) times daily with meals by mouth.    vitamin B-12 (CYANOCOBALAMIN) 500 MCG tablet Take 1 tablet (500 mcg total) by mouth daily.     Allergies: Allergies  Allergen Reactions   Doxycycline Nausea And Vomiting    Caused "DEATHLY NAUSEA AND VOMITING"   Lipitor [Atorvastatin] Other (See Comments)    Stomach pain   Strawberry Extract Rash    Social History: The patient  reports that she has quit smoking. Her smoking use included cigarettes. She has never used smokeless tobacco. She reports current alcohol use. She reports that she does not use drugs.   Family History:  She was adopted. Family history is unknown by patient.   Review of Systems: Please see the history of present illness.   All other systems are reviewed and negative.   Physical Exam: VS:  BP 118/70    Pulse 80    Ht 5\' 8"  (1.727 m)    Wt 114 lb 1.9 oz (51.8 kg)    SpO2 100%    BMI 17.35 kg/m  .  BMI Body mass index is 17.35 kg/m.  Wt Readings from Last 3 Encounters:  05/14/19 114 lb 1.9 oz (51.8 kg)  11/27/18 107 lb (48.5 kg)  10/18/18 108 lb (49 kg)    General: Pleasant. She looks chronically ill but alert and in no acute distress.   HEENT: Normal.  Neck: Supple, no JVD, carotid bruits, or masses noted.  Cardiac: Regular rate and rhythm. Outflow murmur noted. No edema.  Respiratory:  Lungs are clear to auscultation bilaterally with normal work of breathing.  GI: Soft and nontender.  MS: No deformity or atrophy. Gait and ROM intact. Using a walker.  Skin: Warm and dry. Color is sallow.  Neuro:  Strength and sensation are intact and no gross focal deficits noted.  Psych: Alert, appropriate and with normal affect.   LABORATORY DATA:  EKG:  EKG is ordered today. This demonstrates NSR with septal Q's - HR is 80.  Lab Results  Component Value Date   WBC 6.1 06/26/2018   HGB 12.6 10/18/2018   HCT 37.0 10/18/2018   PLT 103 (L) 06/26/2018   GLUCOSE 119 (H) 10/18/2018   CHOL  110 06/26/2018   TRIG 125 06/26/2018   HDL 59 06/26/2018   LDLDIRECT 34.4 11/17/2008   LDLCALC 26 06/26/2018   ALT  14 06/26/2018   AST 20 06/26/2018   NA 138 10/18/2018   K 5.1 10/18/2018   CL 97 06/26/2018   CREATININE 3.90 (H) 10/18/2018   BUN 31 (H) 06/26/2018   CO2 23 06/26/2018   TSH 2.185 01/17/2014   INR 1.07 04/18/2018   HGBA1C 6.0 (H) 05/15/2018     BNP (last 3 results) Recent Labs    05/26/18 2237  BNP 4,003.0*    ProBNP (last 3 results) No results for input(s): PROBNP in the last 8760 hours.   Other Studies Reviewed Today:   The following studies were reviewed today:  RIGHT/LEFT HEART CATH AND CORONARY/GRAFT ANGIOGRAPHY10/11/2017  Conclusion     LV end diastolic pressure is moderately elevated.  There is mild-moderate aortic valve stenosis. --Does not meet criteria for low output aortic stenosis.  Prox RCA lesion is 95% stenosed. Prox RCA to Mid RCA lesion is 95% stenosed. Mid RCA lesion is 100% stenosed -CTO at site of initial subtotal occlusion  SVG-RCA graft was visualized by angiography. Origin lesion is 100% stenosed.  Minimal to mild disease in the circumflex which essentially is a large OM branch.  SVG-CxOM graft was visualized by angiography. Origin lesion is 100% stenosed.  Prox LAD lesion is 99% stenosed with 70% stenosed side branch in Ost 1st Diag. -This represents the only significant new lesion from pre-CABG.  Prox LAD to Mid LAD lesion is 100% stenosed -CTO at original 80% stenosis site.  LIMA-mLAD graft was visualized by angiography and is large. Origin lesion is 40% stenosed.  Heavily calcified left subclavian artery at the takeoff of LIMA and vertebral artery.  Severe two-vessel native CAD with diffuse 95% followed by 100% CTO of the mid RCA (fills via left to right collaterals), and 99% proximal LAD prior to D1 followed by 100% CTO in the mid. Widely patent native Circumflex-OM with mild diffuse disease. Patent LIMA-LAD  with heavy calcification in the subclavian artery near the takeoff as well as a proximal calcified 40% stenosis. Chronic total occlusion of SVG-RCA and SVG-OM. --Extent of CAD is essentially not much different than prior to CABG with exception of now CTO of the RCA is post a subtotal occlusion. The only new lesion that was not present on pre-CABG films is a 99% proximal LAD lesion prior to D1. -->Reduction in LVEF is out of proportion with extent of CAD change.  Relatively normal PA pressures with PCWP of 18 and LVEDP of 22. Probably moderate aortic stenosis with minimal gradient but low output by thermodilution. Conflicting Cardiac Output/Index by Fick and thermodilution. Thermodilution estimates 3.57/2.19, Fick estimates 5.09/3.13.  Recommendation: Continued medical management of cardiomyopathy and volume control with dialysis. Does not appear to have low output aortic stenosis given the minimal gradient in the Cath Lab. Consider nonischemic etiology for reduced EF.  Recommend Aspirin 81mg  daily for severe CAD.Glenetta Hew, MD   2D Echo 05/16/18 Study Conclusions  - Left ventricle: The cavity size was normal. Wall thickness was increased in a pattern of mild LVH. Systolic function was severely reduced. The estimated ejection fraction was in the range of 20% to 25%. Diffuse hypokinesis with relative preservation of the basal anterior wall and the basal to mid lateral wall. Septal-lateral dyssynychrony. Doppler parameters are consistent with restrictive physiology, indicative of decreased left ventricular diastolic compliance and/or increased left atrial pressure. E/medial e&' >15 suggests LV end diastolic pressure at least 20 mmHg. - Aortic valve: Functionally bicuspid with apparent fusion of the right and left coronary cusps;  severely calcified leaflets. There was trivial regurgitation.Overall, suspect moderate, low gradient aortic  stenosis.Visually does not appear severe (not marked turbulence). Mean gradient (S): 11 mm Hg. Valve area (VTI): 1.01 cm^2. - Mitral valve: Mildly calcified annulus. There was moderate regurgitation. - Left atrium: The atrium was moderately dilated. - Right ventricle: The cavity size was normal. Systolic function was mildly to moderately reduced. - Right atrium: The atrium was moderately dilated. - Tricuspid valve: There was moderate regurgitation. Peak RV-RA gradient (S): 39 mm Hg. - Pulmonary arteries: PA peak pressure: 47 mm Hg (S). - Systemic veins: IVC measured 2.0 cm with < 50% respirophasic variation, suggesting RA pressure 8 mmHg.  Impressions:  - Normal LV size with mild LV hypertrophy. EF 20-25% with wall motion abnormalities as noted above. Restrictive diastolic function. Normal RV size with mild to moderate systolic dysfunction. Moderate MR, moderate TR. Mild pulmonary hypertension. The aortic valve appeared functionally bicuspid (left and right cusps fused). There was severe calcification. Mean gradient 11 with AVA 1.01 cm^2 by continuity equation. Visually, think low gradient moderate aortic stenosis is most likely.  EchoStudy Conclusions2/2018  - Left ventricle: The cavity size was normal. There was moderate focal basal and mild concentric hypertrophy. Systolic function was normal. The estimated ejection fraction was in the range of 55% to 60%. Wall motion was normal; there were no regional wall motion abnormalities. Features are consistent with a pseudonormal left ventricular filling pattern, with concomitant abnormal relaxation and increased filling pressure (grade 2 diastolic dysfunction). - Aortic valve: The AV appears mildly to moderately stenotic by doppler with a mean AV gradient of 49mmHg. The calculated AVA is 0.9cm2 and likely underestimates with AVA due to inaccurate measurment of the LVOT.  Visually there appears to be mild to moderate AS. Valve area (VTI): 0.9 cm^2. Valve area (Vmax): 0.89 cm^2. Valve area (Vmean): 0.86 cm^2. - Mitral valve: There was trivial regurgitation. - Right ventricle: Systolic function was mildly reduced. - Pulmonic valve: There was mild regurgitation. - Pulmonary arteries: PA peak pressure: 33 mm Hg (S).   Myoview Study Highlights 02/2016    Nuclear stress EF: 54%. There is mid inferior wall hypokinesis  There was no ST segment deviation noted during stress.  Defect 1: There is a medium defect of moderate severity present in the basal inferior and mid inferior location.  The mid inferior wall perfusion defect is reversible consistent with ischemia. The basal inferior wall defect is mostly fixed consistent with infarct.  This is an intermediate risk study. Consider occlusion of SVG to RCA graft.    Carotid Doppler Summary2/2018: Findings consistent with a high end 1- 39 percent stenosis involving the right internal carotid artery and the left internal carotid artery. Can not rule out higher grade stenosis due to severe amount of shadowing plaque in bilateral carotid arteries and difficulty visualizing vessels. Severe amount of plaque are subjectively suggestive of a 40-59% percent stenosis bilaterally. The right vertebral demonstrates atypical flow, left vertebral is patent and antegrade.   ASSESSMENT & PLAN:    1.CAD - prior CABG - with repeat cath back in October of 2019 - she is managed medically. She has no active symptoms fortunately. She is on no medicines due to BP issues and issues with prior bleeding.   2. 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   3. Bicuspid AV with moderate AS noted on her prior R heart cath - she has  no cardinal symptoms. She would be at high risk for surgery as well as TAVR given all her  co-morbidities. She is not able to be on anticoagulation as well. Will follow for now. Overall prognosis is quite tenuous but for now, she seems to be holding her own.   4.ESRD: remains on dialysis.   5. Prior GI bleed - not on any anticoagulation due to prior bleed and need for transfusion - she did not wish to have GI work up and is not even on aspirin. No active bleeding.   6. HTN - no longer an issue - getting Midodrine on dialysis days  7. COVID-19 Education: The signs and symptoms of COVID-19 were discussed with the patient and how to seek care for testing (follow up with PCP or arrange E-visit).  The importance of social distancing, staying at home, hand hygiene and wearing a mask when out in public were discussed today.  Current medicines are reviewed with the patient today.  The patient does not have concerns regarding medicines other than what has been noted above.  The following changes have been made:  See above.  Labs/ tests ordered today include:    Orders Placed This Encounter  Procedures   EKG 12-Lead     Disposition:   FU with me in 6 months.   Patient is agreeable to this plan and will call if any problems develop in the interim.   SignedTruitt Merle, NP  05/14/2019 9:51 AM  [facility] [address] [place], [place]  [address] Phone: [phone] Fax: [phone]

## 2019-05-14 ENCOUNTER — Encounter: Payer: Self-pay | Admitting: Nurse Practitioner

## 2019-05-14 ENCOUNTER — Other Ambulatory Visit: Payer: Self-pay

## 2019-05-14 ENCOUNTER — Ambulatory Visit: Payer: Medicare HMO | Admitting: Nurse Practitioner

## 2019-05-14 VITALS — BP 118/70 | HR 80 | Ht 68.0 in | Wt 114.1 lb

## 2019-05-14 DIAGNOSIS — E7800 Pure hypercholesterolemia, unspecified: Secondary | ICD-10-CM

## 2019-05-14 DIAGNOSIS — I350 Nonrheumatic aortic (valve) stenosis: Secondary | ICD-10-CM | POA: Diagnosis not present

## 2019-05-14 DIAGNOSIS — Z992 Dependence on renal dialysis: Secondary | ICD-10-CM

## 2019-05-14 DIAGNOSIS — Z7189 Other specified counseling: Secondary | ICD-10-CM | POA: Diagnosis not present

## 2019-05-14 DIAGNOSIS — I259 Chronic ischemic heart disease, unspecified: Secondary | ICD-10-CM

## 2019-05-14 DIAGNOSIS — N186 End stage renal disease: Secondary | ICD-10-CM

## 2019-05-14 DIAGNOSIS — I10 Essential (primary) hypertension: Secondary | ICD-10-CM

## 2019-05-14 NOTE — Patient Instructions (Signed)

## 2019-05-15 DIAGNOSIS — N186 End stage renal disease: Secondary | ICD-10-CM | POA: Diagnosis not present

## 2019-05-15 DIAGNOSIS — I129 Hypertensive chronic kidney disease with stage 1 through stage 4 chronic kidney disease, or unspecified chronic kidney disease: Secondary | ICD-10-CM | POA: Diagnosis not present

## 2019-05-15 DIAGNOSIS — Z992 Dependence on renal dialysis: Secondary | ICD-10-CM | POA: Diagnosis not present

## 2019-05-17 DIAGNOSIS — D631 Anemia in chronic kidney disease: Secondary | ICD-10-CM | POA: Diagnosis not present

## 2019-05-17 DIAGNOSIS — Z23 Encounter for immunization: Secondary | ICD-10-CM | POA: Diagnosis not present

## 2019-05-17 DIAGNOSIS — Z992 Dependence on renal dialysis: Secondary | ICD-10-CM | POA: Diagnosis not present

## 2019-05-17 DIAGNOSIS — D689 Coagulation defect, unspecified: Secondary | ICD-10-CM | POA: Diagnosis not present

## 2019-05-17 DIAGNOSIS — N2581 Secondary hyperparathyroidism of renal origin: Secondary | ICD-10-CM | POA: Diagnosis not present

## 2019-05-17 DIAGNOSIS — N186 End stage renal disease: Secondary | ICD-10-CM | POA: Diagnosis not present

## 2019-06-04 DIAGNOSIS — E113593 Type 2 diabetes mellitus with proliferative diabetic retinopathy without macular edema, bilateral: Secondary | ICD-10-CM | POA: Diagnosis not present

## 2019-06-05 DIAGNOSIS — Z23 Encounter for immunization: Secondary | ICD-10-CM | POA: Diagnosis not present

## 2019-06-05 DIAGNOSIS — D631 Anemia in chronic kidney disease: Secondary | ICD-10-CM | POA: Diagnosis not present

## 2019-06-05 DIAGNOSIS — N2581 Secondary hyperparathyroidism of renal origin: Secondary | ICD-10-CM | POA: Diagnosis not present

## 2019-06-05 DIAGNOSIS — Z992 Dependence on renal dialysis: Secondary | ICD-10-CM | POA: Diagnosis not present

## 2019-06-05 DIAGNOSIS — N186 End stage renal disease: Secondary | ICD-10-CM | POA: Diagnosis not present

## 2019-06-05 DIAGNOSIS — D689 Coagulation defect, unspecified: Secondary | ICD-10-CM | POA: Diagnosis not present

## 2019-06-15 DIAGNOSIS — N186 End stage renal disease: Secondary | ICD-10-CM | POA: Diagnosis not present

## 2019-06-15 DIAGNOSIS — I129 Hypertensive chronic kidney disease with stage 1 through stage 4 chronic kidney disease, or unspecified chronic kidney disease: Secondary | ICD-10-CM | POA: Diagnosis not present

## 2019-06-15 DIAGNOSIS — Z992 Dependence on renal dialysis: Secondary | ICD-10-CM | POA: Diagnosis not present

## 2019-06-17 DIAGNOSIS — E611 Iron deficiency: Secondary | ICD-10-CM | POA: Diagnosis not present

## 2019-06-17 DIAGNOSIS — D689 Coagulation defect, unspecified: Secondary | ICD-10-CM | POA: Diagnosis not present

## 2019-06-17 DIAGNOSIS — D631 Anemia in chronic kidney disease: Secondary | ICD-10-CM | POA: Diagnosis not present

## 2019-06-17 DIAGNOSIS — Z992 Dependence on renal dialysis: Secondary | ICD-10-CM | POA: Diagnosis not present

## 2019-06-17 DIAGNOSIS — N2581 Secondary hyperparathyroidism of renal origin: Secondary | ICD-10-CM | POA: Diagnosis not present

## 2019-06-17 DIAGNOSIS — Z23 Encounter for immunization: Secondary | ICD-10-CM | POA: Diagnosis not present

## 2019-06-17 DIAGNOSIS — N186 End stage renal disease: Secondary | ICD-10-CM | POA: Diagnosis not present

## 2019-06-27 ENCOUNTER — Other Ambulatory Visit: Payer: Self-pay | Admitting: Nurse Practitioner

## 2019-07-05 DIAGNOSIS — N2581 Secondary hyperparathyroidism of renal origin: Secondary | ICD-10-CM | POA: Diagnosis not present

## 2019-07-05 DIAGNOSIS — D631 Anemia in chronic kidney disease: Secondary | ICD-10-CM | POA: Diagnosis not present

## 2019-07-05 DIAGNOSIS — Z23 Encounter for immunization: Secondary | ICD-10-CM | POA: Diagnosis not present

## 2019-07-05 DIAGNOSIS — E611 Iron deficiency: Secondary | ICD-10-CM | POA: Diagnosis not present

## 2019-07-05 DIAGNOSIS — N186 End stage renal disease: Secondary | ICD-10-CM | POA: Diagnosis not present

## 2019-07-05 DIAGNOSIS — D689 Coagulation defect, unspecified: Secondary | ICD-10-CM | POA: Diagnosis not present

## 2019-07-05 DIAGNOSIS — Z992 Dependence on renal dialysis: Secondary | ICD-10-CM | POA: Diagnosis not present

## 2019-07-15 DIAGNOSIS — N186 End stage renal disease: Secondary | ICD-10-CM | POA: Diagnosis not present

## 2019-07-15 DIAGNOSIS — Z992 Dependence on renal dialysis: Secondary | ICD-10-CM | POA: Diagnosis not present

## 2019-07-15 DIAGNOSIS — I129 Hypertensive chronic kidney disease with stage 1 through stage 4 chronic kidney disease, or unspecified chronic kidney disease: Secondary | ICD-10-CM | POA: Diagnosis not present

## 2019-07-17 DIAGNOSIS — Z992 Dependence on renal dialysis: Secondary | ICD-10-CM | POA: Diagnosis not present

## 2019-07-17 DIAGNOSIS — N186 End stage renal disease: Secondary | ICD-10-CM | POA: Diagnosis not present

## 2019-07-17 DIAGNOSIS — D689 Coagulation defect, unspecified: Secondary | ICD-10-CM | POA: Diagnosis not present

## 2019-07-17 DIAGNOSIS — E611 Iron deficiency: Secondary | ICD-10-CM | POA: Diagnosis not present

## 2019-07-17 DIAGNOSIS — N2581 Secondary hyperparathyroidism of renal origin: Secondary | ICD-10-CM | POA: Diagnosis not present

## 2019-07-17 DIAGNOSIS — D631 Anemia in chronic kidney disease: Secondary | ICD-10-CM | POA: Diagnosis not present

## 2019-07-22 DIAGNOSIS — R531 Weakness: Secondary | ICD-10-CM | POA: Diagnosis not present

## 2019-07-23 ENCOUNTER — Observation Stay (HOSPITAL_COMMUNITY): Payer: Medicare HMO

## 2019-07-23 ENCOUNTER — Emergency Department (HOSPITAL_COMMUNITY): Payer: Medicare HMO

## 2019-07-23 ENCOUNTER — Encounter (HOSPITAL_COMMUNITY): Payer: Self-pay | Admitting: Internal Medicine

## 2019-07-23 ENCOUNTER — Inpatient Hospital Stay (HOSPITAL_COMMUNITY)
Admission: EM | Admit: 2019-07-23 | Discharge: 2019-07-27 | DRG: 064 | Disposition: A | Discharge status: Rehabilitation Facility | Payer: Medicare HMO | Attending: Internal Medicine | Admitting: Internal Medicine

## 2019-07-23 ENCOUNTER — Other Ambulatory Visit: Payer: Self-pay

## 2019-07-23 ENCOUNTER — Inpatient Hospital Stay (HOSPITAL_COMMUNITY): Payer: Medicare HMO

## 2019-07-23 DIAGNOSIS — Z8673 Personal history of transient ischemic attack (TIA), and cerebral infarction without residual deficits: Secondary | ICD-10-CM

## 2019-07-23 DIAGNOSIS — I2510 Atherosclerotic heart disease of native coronary artery without angina pectoris: Secondary | ICD-10-CM | POA: Diagnosis present

## 2019-07-23 DIAGNOSIS — I350 Nonrheumatic aortic (valve) stenosis: Secondary | ICD-10-CM | POA: Diagnosis present

## 2019-07-23 DIAGNOSIS — L89151 Pressure ulcer of sacral region, stage 1: Secondary | ICD-10-CM

## 2019-07-23 DIAGNOSIS — E8770 Fluid overload, unspecified: Secondary | ICD-10-CM | POA: Diagnosis not present

## 2019-07-23 DIAGNOSIS — Z992 Dependence on renal dialysis: Secondary | ICD-10-CM | POA: Diagnosis not present

## 2019-07-23 DIAGNOSIS — I5032 Chronic diastolic (congestive) heart failure: Secondary | ICD-10-CM | POA: Diagnosis not present

## 2019-07-23 DIAGNOSIS — I252 Old myocardial infarction: Secondary | ICD-10-CM

## 2019-07-23 DIAGNOSIS — I6309 Cerebral infarction due to thrombosis of other precerebral artery: Secondary | ICD-10-CM

## 2019-07-23 DIAGNOSIS — I361 Nonrheumatic tricuspid (valve) insufficiency: Secondary | ICD-10-CM | POA: Diagnosis not present

## 2019-07-23 DIAGNOSIS — I6602 Occlusion and stenosis of left middle cerebral artery: Secondary | ICD-10-CM | POA: Diagnosis not present

## 2019-07-23 DIAGNOSIS — E1165 Type 2 diabetes mellitus with hyperglycemia: Secondary | ICD-10-CM | POA: Diagnosis not present

## 2019-07-23 DIAGNOSIS — I700 Atherosclerosis of aorta: Secondary | ICD-10-CM | POA: Diagnosis present

## 2019-07-23 DIAGNOSIS — I5042 Chronic combined systolic (congestive) and diastolic (congestive) heart failure: Secondary | ICD-10-CM | POA: Diagnosis present

## 2019-07-23 DIAGNOSIS — N2581 Secondary hyperparathyroidism of renal origin: Secondary | ICD-10-CM | POA: Diagnosis present

## 2019-07-23 DIAGNOSIS — K909 Intestinal malabsorption, unspecified: Secondary | ICD-10-CM | POA: Diagnosis not present

## 2019-07-23 DIAGNOSIS — G8191 Hemiplegia, unspecified affecting right dominant side: Secondary | ICD-10-CM | POA: Diagnosis present

## 2019-07-23 DIAGNOSIS — N250 Renal osteodystrophy: Secondary | ICD-10-CM | POA: Diagnosis not present

## 2019-07-23 DIAGNOSIS — I639 Cerebral infarction, unspecified: Secondary | ICD-10-CM | POA: Diagnosis not present

## 2019-07-23 DIAGNOSIS — E1122 Type 2 diabetes mellitus with diabetic chronic kidney disease: Secondary | ICD-10-CM | POA: Diagnosis present

## 2019-07-23 DIAGNOSIS — E785 Hyperlipidemia, unspecified: Secondary | ICD-10-CM | POA: Diagnosis present

## 2019-07-23 DIAGNOSIS — R29818 Other symptoms and signs involving the nervous system: Secondary | ICD-10-CM | POA: Diagnosis not present

## 2019-07-23 DIAGNOSIS — E1140 Type 2 diabetes mellitus with diabetic neuropathy, unspecified: Secondary | ICD-10-CM | POA: Diagnosis present

## 2019-07-23 DIAGNOSIS — R531 Weakness: Secondary | ICD-10-CM | POA: Diagnosis not present

## 2019-07-23 DIAGNOSIS — R7309 Other abnormal glucose: Secondary | ICD-10-CM | POA: Diagnosis not present

## 2019-07-23 DIAGNOSIS — I63321 Cerebral infarction due to thrombosis of right anterior cerebral artery: Secondary | ICD-10-CM | POA: Diagnosis not present

## 2019-07-23 DIAGNOSIS — G4700 Insomnia, unspecified: Secondary | ICD-10-CM | POA: Diagnosis present

## 2019-07-23 DIAGNOSIS — Z20828 Contact with and (suspected) exposure to other viral communicable diseases: Secondary | ICD-10-CM | POA: Diagnosis not present

## 2019-07-23 DIAGNOSIS — Z681 Body mass index (BMI) 19 or less, adult: Secondary | ICD-10-CM

## 2019-07-23 DIAGNOSIS — I63422 Cerebral infarction due to embolism of left anterior cerebral artery: Principal | ICD-10-CM | POA: Diagnosis present

## 2019-07-23 DIAGNOSIS — I10 Essential (primary) hypertension: Secondary | ICD-10-CM | POA: Diagnosis not present

## 2019-07-23 DIAGNOSIS — I63522 Cerebral infarction due to unspecified occlusion or stenosis of left anterior cerebral artery: Secondary | ICD-10-CM | POA: Diagnosis not present

## 2019-07-23 DIAGNOSIS — N186 End stage renal disease: Secondary | ICD-10-CM

## 2019-07-23 DIAGNOSIS — R197 Diarrhea, unspecified: Secondary | ICD-10-CM | POA: Diagnosis not present

## 2019-07-23 DIAGNOSIS — D6959 Other secondary thrombocytopenia: Secondary | ICD-10-CM | POA: Diagnosis present

## 2019-07-23 DIAGNOSIS — Z9181 History of falling: Secondary | ICD-10-CM

## 2019-07-23 DIAGNOSIS — I953 Hypotension of hemodialysis: Secondary | ICD-10-CM | POA: Diagnosis present

## 2019-07-23 DIAGNOSIS — I5043 Acute on chronic combined systolic (congestive) and diastolic (congestive) heart failure: Secondary | ICD-10-CM | POA: Diagnosis not present

## 2019-07-23 DIAGNOSIS — Z87891 Personal history of nicotine dependence: Secondary | ICD-10-CM

## 2019-07-23 DIAGNOSIS — K219 Gastro-esophageal reflux disease without esophagitis: Secondary | ICD-10-CM | POA: Diagnosis present

## 2019-07-23 DIAGNOSIS — I340 Nonrheumatic mitral (valve) insufficiency: Secondary | ICD-10-CM | POA: Diagnosis not present

## 2019-07-23 DIAGNOSIS — R627 Adult failure to thrive: Secondary | ICD-10-CM | POA: Diagnosis present

## 2019-07-23 DIAGNOSIS — Z961 Presence of intraocular lens: Secondary | ICD-10-CM | POA: Diagnosis present

## 2019-07-23 DIAGNOSIS — F419 Anxiety disorder, unspecified: Secondary | ICD-10-CM | POA: Diagnosis present

## 2019-07-23 DIAGNOSIS — Z9841 Cataract extraction status, right eye: Secondary | ICD-10-CM

## 2019-07-23 DIAGNOSIS — E119 Type 2 diabetes mellitus without complications: Secondary | ICD-10-CM | POA: Diagnosis not present

## 2019-07-23 DIAGNOSIS — I120 Hypertensive chronic kidney disease with stage 5 chronic kidney disease or end stage renal disease: Secondary | ICD-10-CM | POA: Diagnosis not present

## 2019-07-23 DIAGNOSIS — R29709 NIHSS score 9: Secondary | ICD-10-CM | POA: Diagnosis present

## 2019-07-23 DIAGNOSIS — N898 Other specified noninflammatory disorders of vagina: Secondary | ICD-10-CM | POA: Diagnosis not present

## 2019-07-23 DIAGNOSIS — Z951 Presence of aortocoronary bypass graft: Secondary | ICD-10-CM

## 2019-07-23 DIAGNOSIS — R29810 Facial weakness: Secondary | ICD-10-CM | POA: Diagnosis not present

## 2019-07-23 DIAGNOSIS — Z8719 Personal history of other diseases of the digestive system: Secondary | ICD-10-CM

## 2019-07-23 DIAGNOSIS — I672 Cerebral atherosclerosis: Secondary | ICD-10-CM | POA: Diagnosis present

## 2019-07-23 DIAGNOSIS — N3020 Other chronic cystitis without hematuria: Secondary | ICD-10-CM | POA: Diagnosis not present

## 2019-07-23 DIAGNOSIS — I6523 Occlusion and stenosis of bilateral carotid arteries: Secondary | ICD-10-CM | POA: Diagnosis not present

## 2019-07-23 DIAGNOSIS — I472 Ventricular tachycardia: Secondary | ICD-10-CM | POA: Diagnosis not present

## 2019-07-23 DIAGNOSIS — K581 Irritable bowel syndrome with constipation: Secondary | ICD-10-CM | POA: Diagnosis present

## 2019-07-23 DIAGNOSIS — D631 Anemia in chronic kidney disease: Secondary | ICD-10-CM | POA: Diagnosis not present

## 2019-07-23 DIAGNOSIS — G459 Transient cerebral ischemic attack, unspecified: Secondary | ICD-10-CM | POA: Diagnosis not present

## 2019-07-23 DIAGNOSIS — I132 Hypertensive heart and chronic kidney disease with heart failure and with stage 5 chronic kidney disease, or end stage renal disease: Secondary | ICD-10-CM | POA: Diagnosis present

## 2019-07-23 DIAGNOSIS — Z888 Allergy status to other drugs, medicaments and biological substances status: Secondary | ICD-10-CM

## 2019-07-23 DIAGNOSIS — I63531 Cerebral infarction due to unspecified occlusion or stenosis of right posterior cerebral artery: Secondary | ICD-10-CM | POA: Diagnosis not present

## 2019-07-23 DIAGNOSIS — K922 Gastrointestinal hemorrhage, unspecified: Secondary | ICD-10-CM | POA: Diagnosis not present

## 2019-07-23 DIAGNOSIS — R4182 Altered mental status, unspecified: Secondary | ICD-10-CM

## 2019-07-23 DIAGNOSIS — Z8711 Personal history of peptic ulcer disease: Secondary | ICD-10-CM

## 2019-07-23 DIAGNOSIS — I63322 Cerebral infarction due to thrombosis of left anterior cerebral artery: Secondary | ICD-10-CM | POA: Diagnosis not present

## 2019-07-23 DIAGNOSIS — Z751 Person awaiting admission to adequate facility elsewhere: Secondary | ICD-10-CM

## 2019-07-23 DIAGNOSIS — Z9842 Cataract extraction status, left eye: Secondary | ICD-10-CM

## 2019-07-23 DIAGNOSIS — R5383 Other fatigue: Secondary | ICD-10-CM | POA: Diagnosis not present

## 2019-07-23 DIAGNOSIS — Z881 Allergy status to other antibiotic agents status: Secondary | ICD-10-CM

## 2019-07-23 LAB — COMPREHENSIVE METABOLIC PANEL
ALT: 20 U/L (ref 0–44)
AST: 28 U/L (ref 15–41)
Albumin: 3.2 g/dL — ABNORMAL LOW (ref 3.5–5.0)
Alkaline Phosphatase: 98 U/L (ref 38–126)
Anion gap: 14 (ref 5–15)
BUN: 22 mg/dL (ref 8–23)
CO2: 25 mmol/L (ref 22–32)
Calcium: 9.1 mg/dL (ref 8.9–10.3)
Chloride: 97 mmol/L — ABNORMAL LOW (ref 98–111)
Creatinine, Ser: 4.26 mg/dL — ABNORMAL HIGH (ref 0.44–1.00)
GFR calc Af Amer: 11 mL/min — ABNORMAL LOW (ref >60)
GFR calc non Af Amer: 9 mL/min — ABNORMAL LOW (ref >60)
Glucose, Bld: 156 mg/dL — ABNORMAL HIGH (ref 70–99)
Potassium: 4.5 mmol/L (ref 3.5–5.1)
Sodium: 136 mmol/L (ref 135–145)
Total Bilirubin: 0.9 mg/dL (ref 0.3–1.2)
Total Protein: 7.1 g/dL (ref 6.5–8.1)

## 2019-07-23 LAB — CBC
HCT: 40.8 % (ref 36.0–46.0)
HCT: 26 % — ABNORMAL LOW (ref 36.0–46.0)
Hemoglobin: 12.6 g/dL (ref 12.0–15.0)
Hemoglobin: 7.8 g/dL — ABNORMAL LOW (ref 12.0–15.0)
MCH: 34.8 pg — ABNORMAL HIGH (ref 26.0–34.0)
MCH: 34.5 pg — ABNORMAL HIGH (ref 26.0–34.0)
MCHC: 30.9 g/dL (ref 30.0–36.0)
MCHC: 30 g/dL (ref 30.0–36.0)
MCV: 112.7 fL — ABNORMAL HIGH (ref 80.0–100.0)
MCV: 115 fL — ABNORMAL HIGH (ref 80.0–100.0)
Platelets: 125 10*3/uL — ABNORMAL LOW (ref 150–400)
Platelets: 90 10*3/uL — ABNORMAL LOW (ref 150–400)
RBC: 3.62 MIL/uL — ABNORMAL LOW (ref 3.87–5.11)
RBC: 2.26 MIL/uL — ABNORMAL LOW (ref 3.87–5.11)
RDW: 17.2 % — ABNORMAL HIGH (ref 11.5–15.5)
RDW: 17.1 % — ABNORMAL HIGH (ref 11.5–15.5)
WBC: 9.9 10*3/uL (ref 4.0–10.5)
WBC: 6.9 10*3/uL (ref 4.0–10.5)
nRBC: 0.2 % (ref 0.0–0.2)
nRBC: 0 % (ref 0.0–0.2)

## 2019-07-23 LAB — CBG MONITORING, ED: Glucose-Capillary: 139 mg/dL — ABNORMAL HIGH (ref 70–99)

## 2019-07-23 LAB — DIFFERENTIAL
Abs Immature Granulocytes: 0.06 10*3/uL (ref 0.00–0.07)
Basophils Absolute: 0 10*3/uL (ref 0.0–0.1)
Basophils Relative: 0 %
Eosinophils Absolute: 0.1 10*3/uL (ref 0.0–0.5)
Eosinophils Relative: 1 %
Immature Granulocytes: 1 %
Lymphocytes Relative: 13 %
Lymphs Abs: 1.3 10*3/uL (ref 0.7–4.0)
Monocytes Absolute: 0.9 10*3/uL (ref 0.1–1.0)
Monocytes Relative: 9 %
Neutro Abs: 7.5 10*3/uL (ref 1.7–7.7)
Neutrophils Relative %: 76 %

## 2019-07-23 LAB — PROTIME-INR
INR: 1 (ref 0.8–1.2)
Prothrombin Time: 13 seconds (ref 11.4–15.2)

## 2019-07-23 LAB — I-STAT CHEM 8, ED
BUN: 23 mg/dL (ref 8–23)
Calcium, Ion: 1.08 mmol/L — ABNORMAL LOW (ref 1.15–1.40)
Chloride: 100 mmol/L (ref 98–111)
Creatinine, Ser: 4.2 mg/dL — ABNORMAL HIGH (ref 0.44–1.00)
Glucose, Bld: 153 mg/dL — ABNORMAL HIGH (ref 70–99)
HCT: 41 % (ref 36.0–46.0)
Hemoglobin: 13.9 g/dL (ref 12.0–15.0)
Potassium: 4.4 mmol/L (ref 3.5–5.1)
Sodium: 135 mmol/L (ref 135–145)
TCO2: 27 mmol/L (ref 22–32)

## 2019-07-23 LAB — HEMOGLOBIN A1C
Hgb A1c MFr Bld: 5.3 % (ref 4.8–5.6)
Mean Plasma Glucose: 105.41 mg/dL

## 2019-07-23 LAB — SARS CORONAVIRUS 2 (TAT 6-24 HRS): SARS Coronavirus 2: NEGATIVE

## 2019-07-23 LAB — LIPID PANEL
Cholesterol: 59 mg/dL (ref 0–200)
HDL: 28 mg/dL — ABNORMAL LOW (ref >40)
LDL Cholesterol: 16 mg/dL (ref 0–99)
Total CHOL/HDL Ratio: 2.1 RATIO
Triglycerides: 74 mg/dL (ref <150)
VLDL: 15 mg/dL (ref 0–40)

## 2019-07-23 LAB — APTT: aPTT: 29 seconds (ref 24–36)

## 2019-07-23 LAB — MAGNESIUM: Magnesium: 1.4 mg/dL — ABNORMAL LOW (ref 1.7–2.4)

## 2019-07-23 MED ORDER — DIPHENHYDRAMINE HCL 25 MG PO CAPS
25.0000 mg | ORAL_CAPSULE | Freq: Every day | ORAL | Status: DC | PRN
  Administered 2019-07-27: 25 mg via ORAL
  Filled 2019-07-23: qty 1

## 2019-07-23 MED ORDER — ASPIRIN 325 MG PO TABS
325.0000 mg | ORAL_TABLET | Freq: Every day | ORAL | Status: DC
  Filled 2019-07-23: qty 1

## 2019-07-23 MED ORDER — ROSUVASTATIN CALCIUM 20 MG PO TABS
20.0000 mg | ORAL_TABLET | Freq: Every day | ORAL | Status: DC
  Administered 2019-07-23 – 2019-07-27 (×5): 20 mg via ORAL
  Filled 2019-07-23 (×6): qty 1

## 2019-07-23 MED ORDER — RENA-VITE PO TABS
1.0000 | ORAL_TABLET | Freq: Every day | ORAL | Status: DC
  Administered 2019-07-23 – 2019-07-26 (×4): 1 via ORAL
  Filled 2019-07-23 (×5): qty 1

## 2019-07-23 MED ORDER — ALPRAZOLAM 0.5 MG PO TABS
1.0000 mg | ORAL_TABLET | Freq: Every day | ORAL | Status: DC
  Administered 2019-07-23 – 2019-07-26 (×4): 1 mg via ORAL
  Filled 2019-07-23 (×4): qty 2

## 2019-07-23 MED ORDER — POTASSIUM CHLORIDE CRYS ER 20 MEQ PO TBCR
40.0000 meq | EXTENDED_RELEASE_TABLET | ORAL | Status: AC
  Administered 2019-07-23 (×2): 40 meq via ORAL
  Filled 2019-07-23 (×3): qty 2

## 2019-07-23 MED ORDER — MIDODRINE HCL 5 MG PO TABS
10.0000 mg | ORAL_TABLET | ORAL | Status: DC
  Administered 2019-07-24 – 2019-07-26 (×2): 10 mg via ORAL
  Filled 2019-07-23 (×2): qty 2

## 2019-07-23 MED ORDER — IOHEXOL 350 MG/ML SOLN
80.0000 mL | Freq: Once | INTRAVENOUS | Status: AC | PRN
  Administered 2019-07-23: 80 mL via INTRAVENOUS

## 2019-07-23 MED ORDER — SODIUM CHLORIDE 0.9% FLUSH
3.0000 mL | Freq: Once | INTRAVENOUS | Status: DC
Start: 2019-07-23 — End: 2019-07-27

## 2019-07-23 MED ORDER — FOLIC ACID 1 MG PO TABS
1.0000 mg | ORAL_TABLET | Freq: Every day | ORAL | Status: DC
  Administered 2019-07-23 – 2019-07-27 (×5): 1 mg via ORAL
  Filled 2019-07-23 (×5): qty 1

## 2019-07-23 MED ORDER — VITAMIN B-12 100 MCG PO TABS
500.0000 ug | ORAL_TABLET | Freq: Every day | ORAL | Status: DC
  Administered 2019-07-23 – 2019-07-27 (×5): 500 ug via ORAL
  Filled 2019-07-23: qty 5
  Filled 2019-07-23: qty 1
  Filled 2019-07-23 (×3): qty 5

## 2019-07-23 MED ORDER — STROKE: EARLY STAGES OF RECOVERY BOOK
Freq: Once | Status: AC
  Administered 2019-07-23: 02:00:00
  Filled 2019-07-23: qty 1

## 2019-07-23 MED ORDER — ACETAMINOPHEN 160 MG/5ML PO SOLN: 650.0000 mg | ORAL | Status: DC | PRN

## 2019-07-23 MED ORDER — CLOPIDOGREL BISULFATE 75 MG PO TABS
75.0000 mg | ORAL_TABLET | Freq: Every day | ORAL | Status: DC
  Administered 2019-07-23 – 2019-07-27 (×5): 75 mg via ORAL
  Filled 2019-07-23 (×5): qty 1

## 2019-07-23 MED ORDER — ACETAMINOPHEN 650 MG RE SUPP: 650.0000 mg | RECTAL | Status: DC | PRN

## 2019-07-23 MED ORDER — ASPIRIN EC 81 MG PO TBEC
81.0000 mg | DELAYED_RELEASE_TABLET | Freq: Every day | ORAL | Status: DC
  Administered 2019-07-24 – 2019-07-27 (×4): 81 mg via ORAL
  Filled 2019-07-23 (×4): qty 1

## 2019-07-23 MED ORDER — SEVELAMER CARBONATE 800 MG PO TABS
1600.0000 mg | ORAL_TABLET | Freq: Three times a day (TID) | ORAL | Status: DC
  Administered 2019-07-23 – 2019-07-27 (×14): 1600 mg via ORAL
  Filled 2019-07-23 (×15): qty 2

## 2019-07-23 MED ORDER — ONDANSETRON HCL 4 MG PO TABS
4.0000 mg | ORAL_TABLET | Freq: Three times a day (TID) | ORAL | Status: DC | PRN
  Administered 2019-07-23: 4 mg via ORAL
  Filled 2019-07-23: qty 1

## 2019-07-23 MED ORDER — ACETAMINOPHEN 325 MG PO TABS
650.0000 mg | ORAL_TABLET | ORAL | Status: DC | PRN
  Administered 2019-07-24: 650 mg via ORAL
  Filled 2019-07-23: qty 2

## 2019-07-23 MED ORDER — ASPIRIN 300 MG RE SUPP: 300.0000 mg | Freq: Every day | RECTAL | Status: DC

## 2019-07-23 MED ORDER — CHLORHEXIDINE GLUCONATE CLOTH 2 % EX PADS
6.0000 | MEDICATED_PAD | Freq: Every day | CUTANEOUS | Status: DC
  Administered 2019-07-24 – 2019-07-25 (×2): 6 via TOPICAL

## 2019-07-23 MED ORDER — LUBIPROSTONE 24 MCG PO CAPS
24.0000 ug | ORAL_CAPSULE | Freq: Every day | ORAL | Status: DC
  Administered 2019-07-23 – 2019-07-27 (×5): 24 ug via ORAL
  Filled 2019-07-23 (×6): qty 1

## 2019-07-23 MED ORDER — CALCITRIOL 0.25 MCG PO CAPS
0.5000 ug | ORAL_CAPSULE | Freq: Every day | ORAL | Status: DC
  Administered 2019-07-24 – 2019-07-27 (×4): 0.5 ug via ORAL
  Filled 2019-07-23: qty 2
  Filled 2019-07-23: qty 1
  Filled 2019-07-23 (×3): qty 2

## 2019-07-23 MED ORDER — CALCIUM GLUCONATE-NACL 1-0.675 GM/50ML-% IV SOLN
1.0000 g | Freq: Once | INTRAVENOUS | Status: AC
  Administered 2019-07-23: 1000 mg via INTRAVENOUS
  Filled 2019-07-23: qty 50

## 2019-07-23 NOTE — Consult Note (Signed)
Requesting Physician: Dr. Dina Rich    Chief Complaint: Right-sided weakness  History obtained from: Patient and Chart  HPI:                                                                                                                                       [name] is a 78 y.o. 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  Patient was last known normal around 6 PM.  Patient is husband returned an hour later and noticed she was not moving her right side.Her symptoms did not improve and he called EMS.  When EMS arrived patient noted to have right arm contracted as well as plegic in both right upper and lower extremities without obvious neglect.  Patient blood pressure was around [id number] systolic and glucose was [id number].  Stat CT head was performed which showed no acute findings, hemorrhage.  Initial NIH stroke scale was 9 however patient not a candidate for IV TPA as she was outside the window with the time she arrived.  CT angiogram negative for large vessel occlusion, however positive for significant intracranial and extracranial  atherosclerotic disease.  Patient states that she thinks she has not been feeling the same since her dialysis since 11:30 PM.  Per EMS she was however was moving all 4 extremities until this evening.  Patient had a right thalamic infarct in 2018, and is on aspirin.  She has not been on dual antiplatelet therapy due to thrombocytopenia associated with her chronic kidney disease.  Date last known well: 12.7.20 Time last known well: 6 PM tPA Given: No, outside window NIHSS: 9 Baseline MRS 1    Past Medical History:  Diagnosis Date  . Anemia    Pt is taking iron.   [name] Anxiety   . Arthritis   . Carotid stenosis    40-59% bilateral  ICA stenosis in 2/12.  . Chronic low back pain   . CKD (chronic kidney disease)    Dr. Audie Clear at [facility]  . Coronary artery disease    Pt presented 2/10 to [facility] with NSTEMI and diastolic CHF exacerbation.  LHC was done  3/10 showing 99% pRCA stenosis and 80% calcified pLAD stenosis with L=>R collaterals.  Pt was referred  for CABG which was done by Dr. Prescott Gum with LIMA-LAD, SVG-RCA, SVG-OM.  . Diabetes mellitus   . Diabetic neuropathy ([place])   . Diastolic CHF (HCC)    Echo (2/10) showed EF 55-65%, mild LVH, diastolic dysfunction, mild AS with mean gradient 12 mmHg, PASP 43 mmHg.  Echo (2/12): EF 55-60%, mild LVH, mild AS (mean gradient 12), PA systolic pressure 32 mmHg.     [name] GERD (gastroesophageal reflux disease)   . Heart murmur   .  Hyperlipidemia   . Hypertension   . Mild aortic stenosis    mean gradient 12 mmHg in 2/12.  . Myocardial infarction ([place])    "mild"  . Pneumonia   . PONV (postoperative nausea and vomiting)   . Stroke [facility])    " mild"  . Thrombocytopenia ([place])   . Unsteady gait     Past Surgical History:  Procedure Laterality Date  . AORTIC ARCH ANGIOGRAPHY N/A 10/18/2018   Procedure: AORTIC ARCH ANGIOGRAPHY;  Surgeon: Marty Heck, MD;  Location: [place] CV [facility];  Service: Cardiovascular;  Laterality: N/A;  . AV FISTULA PLACEMENT Left 01/30/2015   Procedure: Creation of a Radial Cephalic AV Fistula left wrist;  Surgeon: [name], MD;  Location: [place];  Service: Vascular;  Laterality: Left;  . AV FISTULA PLACEMENT Left 09/06/2018   Procedure: LEFT ARM ARTERIOVENOUS (AV) FISTULA CREATION;  Surgeon: Marty Heck, MD;  Location: [place];  Service: Vascular;  Laterality: Left;  . BACK SURGERY     multiple  . BREAST SURGERY     biopsy  . CARDIAC CATHETERIZATION    . CATARACT EXTRACTION W/ INTRAOCULAR LENS  IMPLANT, BILATERAL    . COLONOSCOPY Left 11/03/2016   Procedure: COLONOSCOPY;  Surgeon: [name], MD;  Location: [facility]  ENDOSCOPY;  Service: Endoscopy;  Laterality: Left;  . COLONOSCOPY W/ BIOPSIES AND POLYPECTOMY    . CORONARY ARTERY BYPASS GRAFT  09/2008   pt with NSTEMI and diastolic CHF exacerbation.  LHC was done  3/10 showing 99% pRCA stenosis and 80% calcified pLAD stenosis with L=>R collaterals.  Pt was referred  for CABG which was done by Dr. Prescott Gum with LIMA-LAD, SVG-RCA, SVG-OM.  [name] ESOPHAGOGASTRODUODENOSCOPY N/A 11/02/2016   Procedure: ESOPHAGOGASTRODUODENOSCOPY (EGD);  Surgeon: [name], MD;  Location: [facility] ENDOSCOPY;  Service: Endoscopy;  Laterality: N/A;  . GIVENS CAPSULE STUDY N/A 11/29/2016   Procedure: GIVENS CAPSULE STUDY;  Surgeon: [name], MD;  Location: [place];  Service: Endoscopy;  Laterality: N/A;  . LIGATION OF ARTERIOVENOUS  FISTULA Left 09/13/2018   Procedure: LIGATION OF LEFT ARTERIOVENOUS  FISTULA;  Surgeon: Waynetta Sandy, MD;  Location: [place];  Service: Vascular;  Laterality: Left;  . REVISON OF ARTERIOVENOUS FISTULA Left 07/02/2015   Procedure: REVISON OF LEFT RADIOCEPHALIC ARTERIOVENOUS FISTULA;  Surgeon: [name], MD;  Location: [place];  Service: Vascular;  Laterality: Left;  . RIGHT/LEFT HEART CATH AND CORONARY/GRAFT ANGIOGRAPHY N/A 05/18/2018   Procedure: RIGHT/LEFT HEART CATH AND CORONARY/GRAFT ANGIOGRAPHY;  Surgeon: Leonie Man, MD;  Location: [name] [facility];  Service: Cardiovascular;  Laterality: N/A;  . UPPER EXTREMITY ANGIOGRAPHY Left 10/18/2018   Procedure: UPPER EXTREMITY ANGIOGRAPHY;  Surgeon: Marty Heck, MD;  Location: [place] [facility];  Service: Cardiovascular;  Laterality: Left;    Family History  Adopted: Yes  Family history unknown: Yes   Social History:  reports that she has quit smoking. Her smoking use included cigarettes. She has never used smokeless tobacco. She reports current alcohol use. She reports that she does not use drugs.  Allergies:  Allergies  Allergen Reactions  . Doxycycline Nausea And Vomiting     Caused "DEATHLY NAUSEA AND VOMITING"  . Lipitor [Atorvastatin] Other (See Comments)    Stomach pain  . Strawberry Extract Rash    Medications:  I reviewed home medications   ROS:                                                                                                                                     14 systems reviewed and negative except above    Examination:                                                                                                      General: Appears well-developed  Psych: Affect appropriate to situation Eyes: No scleral injection HENT: No OP obstrucion Head: Normocephalic.  Cardiovascular: Normal rate and regular rhythm.  Respiratory: Effort normal and breath sounds normal to anterior ascultation GI: Soft.  No distension. There is no tenderness.  Skin: WDI    Neurological Examination Mental Status: Alert, oriented, thought content appropriate.  Speech fluent without evidence of aphasia. Able to follow 3 step commands without difficulty. Cranial Nerves: II: Visual fields grossly normal,  III,IV, VI: ptosis not present, extra-ocular motions intact bilaterally, pupils equal, round, reactive to light and accommodation V,VII: Mild right facial droop, slightly reduced sensation of the right side of face VIII: hearing normal bilaterally IX,X: uvula rises symmetrically XI: bilateral shoulder shrug XII: midline tongue extension Motor: Right : Upper extremity   1/5, contracted    Left:     Upper extremity   5/5  Lower extremity   0/5     Lower extremity   5/5 Tone and bulk:normal tone throughout; no atrophy noted Sensory: Reduced sensation to light touch in the right upper and lower extremity compared to the left with no neglect Deep Tendon Reflexes: 2+ and symmetric throughout Plantars: Right: downgoing   Left:  downgoing Cerebellar: normal finger-to-nose, normal rapid alternating movements and normal heel-to-shin test Gait: normal gait and station     Lab Results: Basic Metabolic Panel: Recent Labs  Lab 07/23/19 0038 07/23/19 0051  NA 136 135  K 4.5 4.4  CL 97* 100  CO2 25  --   GLUCOSE 156* 153*  BUN 22 23  CREATININE 4.26* 4.20*  CALCIUM 9.1  --     CBC: Recent Labs  Lab 07/23/19 0038 07/23/19 0051  WBC 9.9  --   NEUTROABS 7.5  --   HGB 12.6 13.9  HCT 40.8 41.0  MCV 112.7*  --   PLT 125*  --     Coagulation Studies: Recent Labs    07/23/19 0038  LABPROT 13.0  INR 1.0    Imaging: Ct Angio Head  W Or Wo Contrast  Result Date: 07/23/2019 CLINICAL DATA:  Right-sided paralysis EXAM: CT ANGIOGRAPHY HEAD AND NECK TECHNIQUE: Multidetector CT imaging of the head and neck was performed using the standard protocol during bolus administration of intravenous contrast. Multiplanar CT image reconstructions and MIPs were obtained to evaluate the vascular anatomy. Carotid stenosis measurements (when applicable) are obtained utilizing NASCET criteria, using the distal internal carotid diameter as the denominator. CONTRAST:  2mL OMNIPAQUE IOHEXOL 350 MG/ML SOLN COMPARISON:  Head CT same day FINDINGS: CTA NECK FINDINGS SKELETON: There is no bony spinal canal stenosis. No lytic or blastic lesion. OTHER NECK: Normal pharynx, larynx and major salivary glands. No cervical lymphadenopathy. Unremarkable thyroid gland. UPPER CHEST: No pneumothorax or pleural effusion. No nodules or masses. AORTIC ARCH: There is moderate calcific atherosclerosis of the aortic arch. There is no aneurysm, dissection or hemodynamically significant stenosis of the visualized portion of the aorta. Conventional 3 vessel aortic branching pattern. 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 RIGHT CAROTID SYSTEM: There is atherosclerotic calcification within the right common carotid artery. There is bulky calcific  atherosclerosis at the carotid bifurcation extending into the proximal right ICA resulting in approximately 50% stenosis. LEFT CAROTID SYSTEM: There is atherosclerotic calcification within the common carotid artery and at the carotid bifurcation. There is no flow-limiting stenosis. VERTEBRAL ARTERIES: Left dominant configuration. Both origins are clearly patent. There is multifocal calcification within both vertebral arteries which remain patent to the vertebrobasilar confluence. CTA HEAD FINDINGS POSTERIOR CIRCULATION: --Vertebral arteries: Atherosclerotic calcification within the left vertebral artery without stenosis. The right vertebral artery terminates in PICA. --Posterior inferior cerebellar arteries (PICA): Patent origins from the vertebral arteries. --Anterior inferior cerebellar arteries (AICA): Patent origins from the basilar artery. 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 --Superior cerebellar arteries: Normal. --Posterior cerebral arteries: Normal. Both originate from the basilar artery. Posterior communicating arteries (p-comm) are diminutive or absent. ANTERIOR CIRCULATION: --Intracranial internal carotid arteries: Atherosclerotic calcification of the internal carotid arteries at the skull base without hemodynamically significant stenosis. 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 Patent distal branches. Right MCA is normal. VENOUS SINUSES: As permitted by contrast timing, patent. ANATOMIC VARIANTS: None Review of the MIP images confirms the above findings. IMPRESSION: 1. No emergent large vessel occlusion. 2. 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 3. 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 4. Bilateral carotid calcific atherosclerosis with 50% stenosis of the proximal right ICA. 5. Aortic  Atherosclerosis (ICD10-I70.0). Electronically Signed   By: Ulyses Jarred M.D.   On: 07/23/2019 01:26   Ct Angio Neck W Or Wo Contrast  Result Date: 07/23/2019 CLINICAL DATA:  Right-sided paralysis EXAM: CT ANGIOGRAPHY HEAD AND NECK TECHNIQUE: Multidetector CT imaging of the head and neck was performed using the standard protocol during bolus administration of intravenous contrast. Multiplanar CT image reconstructions and MIPs were obtained to evaluate the vascular anatomy. Carotid stenosis measurements (when applicable) are obtained utilizing NASCET criteria, using the distal internal carotid diameter as the denominator. CONTRAST:  23mL OMNIPAQUE IOHEXOL 350 MG/ML SOLN COMPARISON:  Head CT same day FINDINGS: CTA NECK FINDINGS SKELETON: There is no bony spinal canal stenosis. No lytic or blastic lesion. OTHER NECK: Normal pharynx, larynx and major salivary glands. No cervical lymphadenopathy. Unremarkable thyroid gland. UPPER CHEST: No pneumothorax or pleural effusion. No nodules or masses. AORTIC ARCH: There is moderate calcific atherosclerosis of the  aortic arch. There is no aneurysm, dissection or hemodynamically significant stenosis of the visualized portion of the aorta. Conventional 3 vessel aortic branching pattern. 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 RIGHT CAROTID SYSTEM: There is atherosclerotic calcification within the right common carotid artery. There is bulky calcific atherosclerosis at the carotid bifurcation extending into the proximal right ICA resulting in approximately 50% stenosis. LEFT CAROTID SYSTEM: There is atherosclerotic calcification within the common carotid artery and at the carotid bifurcation. There is no flow-limiting stenosis. VERTEBRAL ARTERIES: Left dominant configuration. Both origins are clearly patent. There is multifocal calcification within both vertebral arteries which remain patent to the vertebrobasilar confluence. CTA HEAD FINDINGS POSTERIOR  CIRCULATION: --Vertebral arteries: Atherosclerotic calcification within the left vertebral artery without stenosis. The right vertebral artery terminates in PICA. --Posterior inferior cerebellar arteries (PICA): Patent origins from the vertebral arteries. --Anterior inferior cerebellar arteries (AICA): Patent origins from the basilar artery. 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 --Superior cerebellar arteries: Normal. --Posterior cerebral arteries: Normal. Both originate from the basilar artery. Posterior communicating arteries (p-comm) are diminutive or absent. ANTERIOR CIRCULATION: --Intracranial internal carotid arteries: Atherosclerotic calcification of the internal carotid arteries at the skull base without hemodynamically significant stenosis. 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 Patent distal branches. Right MCA is normal. VENOUS SINUSES: As permitted by contrast timing, patent. ANATOMIC VARIANTS: None Review of the MIP images confirms the above findings. IMPRESSION: 1. No emergent large vessel occlusion. 2. 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 3. 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 4. Bilateral carotid calcific atherosclerosis with 50% stenosis of the proximal right ICA. 5. Aortic Atherosclerosis (ICD10-I70.0). Electronically Signed   By: Ulyses Jarred M.D.   On: 07/23/2019 01:26   Ct Head Code Stroke Wo Contrast  Result Date: 07/23/2019 CLINICAL DATA:  Code stroke. Right-sided facial paralysis. Last seen normal 6 p.m. EXAM: CT HEAD WITHOUT CONTRAST TECHNIQUE: Contiguous axial images were obtained from the base of the skull through the vertex without intravenous contrast. COMPARISON:  Brain MRI 09/26/2016 FINDINGS: Brain: There is no mass, hemorrhage or extra-axial collection. There is  generalized atrophy without lobar predilection. There is hypoattenuation of the periventricular white matter, most commonly indicating chronic ischemic microangiopathy. 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 Vascular: No abnormal hyperdensity of the major intracranial arteries or dural venous sinuses. No intracranial atherosclerosis. Skull: The visualized skull base, calvarium and extracranial soft tissues are normal. Sinuses/Orbits: No fluid levels or advanced mucosal thickening of the visualized paranasal sinuses. No mastoid or middle ear effusion. The orbits are normal. ASPECTS [facility] Stroke Program Early CT Score) - Ganglionic level infarction (caudate, lentiform nuclei, internal capsule, insula, M1-M3 cortex): 7 - Supraganglionic infarction (M4-M6 cortex): 3 Total score (0-10 with 10 being normal): 10 IMPRESSION: 1. No intracranial hemorrhage. 2. 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 3. ASPECTS is 10. 4. These results were communicated to Dr. Karena Addison Aroor at 12:57 am on 07/23/2019 by text page via the [facility] messaging system. Electronically Signed   By: Ulyses Jarred M.D.   On: 07/23/2019 00:57     ASSESSMENT AND PLAN    78 y.o. 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   Acute Ischemic Stroke   Risk factors: Diabetes mellitus, coronary artery disease, hypertension, hyperlipidemia, CKD, prior stroke Etiology: Suspect small vessel disease versus intracranial atherosclerosis however pending full evaluation  Recommend # MRI of the brain without contrast #Transthoracic Echo  #Continue aspirin 81 mg daily #Start or  continue Atorvastatin 80 mg/other high intensity statin # BP goal: permissive HTN upto 185/110 mmHg # HBAIC and Lipid profile # Telemetry monitoring # Frequent neuro checks # NPO until passes stroke swallow screen  End-stage renal disease on dialysis -Patient received IV contrast for CT angiogram and recommend dialysis tomorrow  Please page stroke NP  Or  PA  Or MD from 8am -4 pm  as this patient from this time will be  followed by the stroke.   You can look them up on [email or web address]  Password [facility]    Sushanth Aroor [facility] Pager Number [phone]

## 2019-07-23 NOTE — Consult Note (Signed)
Physical Medicine and Rehabilitation Consult Reason for Consult: Right side weakness Referring Physician: [facility]   HPI: [name] is a 78 y.o. right-handed female with history of hypertension, hyperlipidemia, diabetes mellitus, end-stage renal disease with hemodialysis Monday Wednesday Friday, CAD, aortic stenosis, history of right thalamic CVA 2018, tobacco abuse.  Per chart review patient lives with spouse.  1 level home.  Patient did use a Rollator when going to dialysis.  Presented 07/23/2019 with right side weakness.  Cranial CT scan showed no intracranial hemorrhage.  Chronic small vessel ischemia and generalized volume loss noted.  Patient did not receive TPA.  CT angiogram negative for large vessel occlusion however positive for significant intracranial and extracranial atherosclerotic disease.  MRI of the brain areas of acute infarction throughout the distal left anterior cerebral artery territory.  Echocardiogram is pending.  Currently maintained on aspirin and Plavix for CVA prophylaxis.  Hemodialysis ongoing as per renal services.  Therapy evaluation completed with recommendations of physical medicine rehab consult.  The patient was seen with Dr. Leonie Man feels should be ready for rehab in am    Review of Systems  Constitutional: Negative for chills and fever.  HENT: Negative for hearing loss.   Eyes: Negative for blurred vision and double vision.  Respiratory: Positive for shortness of breath. Negative for cough.   Cardiovascular: Positive for leg swelling. Negative for chest pain and palpitations.  Gastrointestinal: Positive for constipation. Negative for heartburn, nausea and vomiting.       GERD  Musculoskeletal: Positive for back pain.       She did have a fall in the house approximately 6 months ago.  Skin: Negative for rash.  Neurological: Positive for weakness.  Psychiatric/Behavioral: The patient has insomnia.        Anxiety  All other systems reviewed and are  negative.  Past Medical History:  Diagnosis Date   Anemia    Pt is taking iron.    Anxiety    Arthritis    Carotid stenosis    40-59% bilateral ICA stenosis in 2/12.   Chronic low back pain    CKD (chronic kidney disease)    Dr. Audie Clear at [facility]   Coronary artery disease    Pt presented 2/10 to [facility] with NSTEMI and diastolic CHF exacerbation.  LHC was done  3/10 showing 99% pRCA stenosis and 80% calcified pLAD stenosis with L=>R collaterals.  Pt was referred  for CABG which was done by Dr. Prescott Gum with LIMA-LAD, SVG-RCA, SVG-OM.   Diabetes mellitus    Diabetic neuropathy (HCC)    Diastolic CHF (HCC)    Echo (2/10) showed EF 55-65%, mild LVH, diastolic dysfunction, mild AS with mean gradient 12 mmHg, PASP 43 mmHg.  Echo (2/12): EF 55-60%, mild LVH, mild AS (mean gradient 12), PA systolic pressure 32 mmHg.      GERD (gastroesophageal reflux disease)    Heart murmur    Hyperlipidemia    Hypertension    Mild aortic stenosis    mean gradient 12 mmHg in 2/12.   Myocardial infarction ([place])    "mild"   Pneumonia    PONV (postoperative nausea and vomiting)    Stroke ([place])    " mild"   Thrombocytopenia ([place])    Unsteady gait    Past Surgical History:  Procedure Laterality Date   AORTIC ARCH ANGIOGRAPHY N/A 10/18/2018   Procedure: AORTIC ARCH ANGIOGRAPHY;  Surgeon: Marty Heck, MD;  Location: [place] CV [facility];  Service: Cardiovascular;  Laterality: N/A;   AV FISTULA PLACEMENT Left 01/30/2015   Procedure: Creation of a Radial Cephalic AV Fistula left wrist;  Surgeon: [name], MD;  Location: [place];  Service: Vascular;  Laterality: Left;   AV FISTULA PLACEMENT Left 09/06/2018   Procedure: LEFT ARM ARTERIOVENOUS (AV) FISTULA CREATION;  Surgeon: Marty Heck, MD;  Location: [place];  Service: Vascular;  Laterality: Left;   BACK SURGERY     multiple   BREAST SURGERY     biopsy   CARDIAC CATHETERIZATION     CATARACT  EXTRACTION W/ INTRAOCULAR LENS  IMPLANT, BILATERAL     COLONOSCOPY Left 11/03/2016   Procedure: COLONOSCOPY;  Surgeon: [name], MD;  Location: [place];  Service: Endoscopy;  Laterality: Left;   COLONOSCOPY W/ BIOPSIES AND POLYPECTOMY     CORONARY ARTERY BYPASS GRAFT  09/2008   pt with NSTEMI and diastolic CHF exacerbation.  LHC was done  3/10 showing 99% pRCA stenosis and 80% calcified pLAD stenosis with L=>R collaterals.  Pt was referred  for CABG which was done by Dr. Prescott Gum with LIMA-LAD, SVG-RCA, SVG-OM.   ESOPHAGOGASTRODUODENOSCOPY N/A 11/02/2016   Procedure: ESOPHAGOGASTRODUODENOSCOPY (EGD);  Surgeon: [name], MD;  Location: [facility] ENDOSCOPY;  Service: Endoscopy;  Laterality: N/A;   GIVENS CAPSULE STUDY N/A 11/29/2016   Procedure: GIVENS CAPSULE STUDY;  Surgeon: [name], MD;  Location: [place];  Service: Endoscopy;  Laterality: N/A;   LIGATION OF ARTERIOVENOUS  FISTULA Left 09/13/2018   Procedure: LIGATION OF LEFT ARTERIOVENOUS  FISTULA;  Surgeon: Waynetta Sandy, MD;  Location: [place];  Service: Vascular;  Laterality: Left;   REVISON OF ARTERIOVENOUS FISTULA Left 07/02/2015   Procedure: REVISON OF LEFT RADIOCEPHALIC ARTERIOVENOUS FISTULA;  Surgeon: [name], MD;  Location: [place];  Service: Vascular;  Laterality: Left;   RIGHT/LEFT HEART CATH AND CORONARY/GRAFT ANGIOGRAPHY N/A 05/18/2018   Procedure: RIGHT/LEFT HEART CATH AND CORONARY/GRAFT ANGIOGRAPHY;  Surgeon: Leonie Man, MD;  Location: [place] CV [facility];  Service: Cardiovascular;  Laterality: N/A;   UPPER EXTREMITY ANGIOGRAPHY Left 10/18/2018   Procedure: UPPER EXTREMITY ANGIOGRAPHY;  Surgeon: Marty Heck, MD;  Location: [place] [facility];  Service: Cardiovascular;  Laterality: Left;   Family History  Adopted: Yes  Family history unknown: Yes   Social History:  reports that she has quit smoking. Her smoking use included cigarettes. She has never used smokeless tobacco. She  reports current alcohol use. She reports that she does not use drugs. Allergies:  Allergies  Allergen Reactions   Doxycycline Nausea And Vomiting    Caused "DEATHLY NAUSEA AND VOMITING"   Lipitor [Atorvastatin] Other (See Comments)    Stomach pain   Strawberry Extract Rash   Medications Prior to Admission  Medication Sig Dispense Refill   acetaminophen (TYLENOL) 500 MG tablet Take 1,000 mg by mouth 3 (three) times daily as needed for moderate pain.     ALPRAZolam (XANAX) 1 MG tablet Take 1 mg by mouth at bedtime.      AMITIZA 24 MCG capsule Take 24 mcg by mouth 2 (two) times daily as needed for constipation.      b complex-vitamin c-folic acid (NEPHRO-VITE) 0.8 MG TABS tablet Take 1 tablet by mouth daily.  3   calcitRIOL (ROCALTROL) 0.5 MCG capsule Take 0.5 mcg by mouth daily.  1   diphenhydrAMINE (BENADRYL) 25 MG tablet Take 25 mg by mouth daily as needed for allergies.     folic acid (FOLVITE) 1  MG tablet Take 1 tablet (1 mg total) by mouth daily. 30 tablet 0   gabapentin (NEURONTIN) 100 MG capsule Take 100 mg by mouth at bedtime.     midodrine (PROAMATINE) 10 MG tablet Take 10 mg by mouth 3 (three) times a week. Patient takes on dialysis days, M,W,F     rosuvastatin (CRESTOR) 5 MG tablet TAKE 1 TABLET BY MOUTH EVERYDAY AT BEDTIME (Patient taking differently: Take 5 mg by mouth at bedtime. ) 90 tablet 3   sevelamer carbonate (RENVELA) 800 MG tablet Take 1,600 mg 3 (three) times daily with meals by mouth.      vitamin B-12 (CYANOCOBALAMIN) 500 MCG tablet Take 1 tablet (500 mcg total) by mouth daily. 30 tablet 0    Home: Home Living Family/patient expects to be discharged to:: Private residence Living Arrangements: Spouse/significant other Available Help at Discharge: Family, Available 24 hours/day Type of Home: House Home Access: Level entry Home Layout: One level Bathroom Shower/Tub: Chiropodist: Standard Home Equipment: Environmental consultant - 4 wheels,  Toilet riser  Functional History: Prior Function Level of Independence: Needs assistance Gait / Transfers Assistance Needed: no device in the home, rollator to dialysis ADL's / Homemaking Assistance Needed: sponge bathes with some help from spouse Functional Status:  Mobility: Bed Mobility Overal bed mobility: Needs Assistance Bed Mobility: Rolling, Sidelying to Sit, Sit to Supine Rolling: Mod assist Sidelying to sit: Max assist, HOB elevated Sit to supine: Total assist General bed mobility comments: cues and increased time to initiate rolling to R, side to sit assist for trunk and legs, to supine assist for all aspects Transfers Overall transfer level: Needs assistance Equipment used: None Transfers: Stand Pivot Transfers Stand pivot transfers: Max assist General transfer comment: to regular chair from stretcher in ED max A for pivot to R, then to higher stretcher max lifting assist from chair and sat on very edge of bed, assist to scoot and lower to supine      ADL:    Cognition: Cognition Overall Cognitive Status: Impaired/Different from baseline Orientation Level: Oriented X4 Cognition Arousal/Alertness: Awake/alert Behavior During Therapy: WFL for tasks assessed/performed Overall Cognitive Status: Impaired/Different from baseline Area of Impairment: Memory, Following commands, Problem solving Memory: Decreased short-term memory Following Commands: Follows one step commands consistently, Follows one step commands with increased time Problem Solving: Slow processing, Decreased initiation, Requires verbal cues, Requires tactile cues General Comments: reports feels slower with thinking and recalling information than prior to event  Blood pressure 119/67, pulse 83, temperature 98 F (36.7 C), temperature source Oral, resp. rate 20, weight 51.9 kg, SpO2 95 %. Physical Exam  Neurological:  Patient is alert in no acute distress and oriented x3.  Follows simple commands.   Fair insight and awareness of deficits   General: No acute distress Mood and affect are appropriate Heart: Regular rate and rhythm no rubs murmurs or extra sounds Lungs: Clear to auscultation, breathing unlabored, no rales or wheezes Abdomen: Positive bowel sounds, soft nontender to palpation, nondistended Extremities: No clubbing, cyanosis, or edema Skin: No evidence of breakdown, no evidence of rash Neurologic: Cranial nerves II through XII intact, motor strength is 4/5 in left  deltoid, bicep, tricep, grip, hip flexor, knee extensors, ankle dorsiflexor and plantar flexor 0/5 on RIght side  Sensory exam normal sensation pinch in  Right upper and lower extremities Cerebellar exam normal finger to nose to finger as well as heel to shin cannot perform due to weakness Musculoskeletal: Full range of motion in all 4  extremities. No joint swelling Results for orders placed or performed during the hospital encounter of 07/23/19 (from the past 24 hour(s))  CBC     Status: Abnormal   Collection Time: 07/24/19  3:59 AM  Result Value Ref Range   WBC 10.4 4.0 - 10.5 K/uL   RBC 3.43 (L) 3.87 - 5.11 MIL/uL   Hemoglobin 11.8 (L) 12.0 - 15.0 g/dL   HCT 38.4 36.0 - 46.0 %   MCV 112.0 (H) 80.0 - 100.0 fL   MCH 34.4 (H) 26.0 - 34.0 pg   MCHC 30.7 30.0 - 36.0 g/dL   RDW 17.6 (H) 11.5 - 15.5 %   Platelets 128 (L) 150 - 400 K/uL   nRBC 0.2 0.0 - 0.2 %   Ct Angio Head W Or Wo Contrast  Result Date: 07/23/2019 CLINICAL DATA:  Right-sided paralysis EXAM: CT ANGIOGRAPHY HEAD AND NECK TECHNIQUE: Multidetector CT imaging of the head and neck was performed using the standard protocol during bolus administration of intravenous contrast. Multiplanar CT image reconstructions and MIPs were obtained to evaluate the vascular anatomy. Carotid stenosis measurements (when applicable) are obtained utilizing NASCET criteria, using the distal internal carotid diameter as the denominator. CONTRAST:  14mL OMNIPAQUE IOHEXOL 350  MG/ML SOLN COMPARISON:  Head CT same day FINDINGS: CTA NECK FINDINGS SKELETON: There is no bony spinal canal stenosis. No lytic or blastic lesion. OTHER NECK: Normal pharynx, larynx and major salivary glands. No cervical lymphadenopathy. Unremarkable thyroid gland. UPPER CHEST: No pneumothorax or pleural effusion. No nodules or masses. AORTIC ARCH: There is moderate calcific atherosclerosis of the aortic arch. There is no aneurysm, dissection or hemodynamically significant stenosis of the visualized portion of the aorta. Conventional 3 vessel aortic branching pattern. 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 RIGHT CAROTID SYSTEM: There is atherosclerotic calcification within the right common carotid artery. There is bulky calcific atherosclerosis at the carotid bifurcation extending into the proximal right ICA resulting in approximately 50% stenosis. LEFT CAROTID SYSTEM: There is atherosclerotic calcification within the common carotid artery and at the carotid bifurcation. There is no flow-limiting stenosis. VERTEBRAL ARTERIES: Left dominant configuration. Both origins are clearly patent. There is multifocal calcification within both vertebral arteries which remain patent to the vertebrobasilar confluence. CTA HEAD FINDINGS POSTERIOR CIRCULATION: --Vertebral arteries: Atherosclerotic calcification within the left vertebral artery without stenosis. The right vertebral artery terminates in PICA. --Posterior inferior cerebellar arteries (PICA): Patent origins from the vertebral arteries. --Anterior inferior cerebellar arteries (AICA): Patent origins from the basilar artery. 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 --Superior cerebellar arteries: Normal. --Posterior cerebral arteries: Normal. Both originate from the basilar artery. Posterior communicating arteries (p-comm) are diminutive or absent. ANTERIOR CIRCULATION: --Intracranial internal carotid arteries:  Atherosclerotic calcification of the internal carotid arteries at the skull base without hemodynamically significant stenosis. 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 Patent distal branches. Right MCA is normal. VENOUS SINUSES: As permitted by contrast timing, patent. ANATOMIC VARIANTS: None Review of the MIP images confirms the above findings. IMPRESSION: 1. No emergent large vessel occlusion. 2. 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 3. 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 4. Bilateral carotid calcific atherosclerosis with 50% stenosis of the proximal right ICA. 5. Aortic Atherosclerosis (ICD10-I70.0). Electronically Signed   By: Ulyses Jarred M.D.   On: 07/23/2019 01:26   Dg Chest 2 View  Result Date: 07/23/2019  CLINICAL DATA:  Facial droop and right-sided paralysis. EXAM: CHEST - 2 VIEW COMPARISON:  Radiograph 05/29/2018 FINDINGS: Right dialysis catheter tip at the atrial caval junction. Post median sternotomy and CABG. Decreased cardiomegaly from prior exam. Aortic atherosclerosis and tortuosity. Bilateral pleural effusions have resolved. Improved interstitial thickening. Mild underlying interstitial coarsening which may be chronic. No focal airspace disease. No pleural fluid or pneumothorax. No acute osseous abnormalities are seen. IMPRESSION: 1. Mild interstitial coarsening, favor this is chronic rather than pulmonary edema. 2. No other acute findings. 3. Dialysis catheter tip at the atrial caval junction. Electronically Signed   By: Keith Rake M.D.   On: 07/23/2019 02:45   Ct Angio Neck W Or Wo Contrast  Result Date: 07/23/2019 CLINICAL DATA:  Right-sided paralysis EXAM: CT ANGIOGRAPHY HEAD AND NECK TECHNIQUE: Multidetector CT imaging of the head and neck was performed using the standard protocol during bolus administration  of intravenous contrast. Multiplanar CT image reconstructions and MIPs were obtained to evaluate the vascular anatomy. Carotid stenosis measurements (when applicable) are obtained utilizing NASCET criteria, using the distal internal carotid diameter as the denominator. CONTRAST:  9mL OMNIPAQUE IOHEXOL 350 MG/ML SOLN COMPARISON:  Head CT same day FINDINGS: CTA NECK FINDINGS SKELETON: There is no bony spinal canal stenosis. No lytic or blastic lesion. OTHER NECK: Normal pharynx, larynx and major salivary glands. No cervical lymphadenopathy. Unremarkable thyroid gland. UPPER CHEST: No pneumothorax or pleural effusion. No nodules or masses. AORTIC ARCH: There is moderate calcific atherosclerosis of the aortic arch. There is no aneurysm, dissection or hemodynamically significant stenosis of the visualized portion of the aorta. Conventional 3 vessel aortic branching pattern. 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 RIGHT CAROTID SYSTEM: There is atherosclerotic calcification within the right common carotid artery. There is bulky calcific atherosclerosis at the carotid bifurcation extending into the proximal right ICA resulting in approximately 50% stenosis. LEFT CAROTID SYSTEM: There is atherosclerotic calcification within the common carotid artery and at the carotid bifurcation. There is no flow-limiting stenosis. VERTEBRAL ARTERIES: Left dominant configuration. Both origins are clearly patent. There is multifocal calcification within both vertebral arteries which remain patent to the vertebrobasilar confluence. CTA HEAD FINDINGS POSTERIOR CIRCULATION: --Vertebral arteries: Atherosclerotic calcification within the left vertebral artery without stenosis. The right vertebral artery terminates in PICA. --Posterior inferior cerebellar arteries (PICA): Patent origins from the vertebral arteries. --Anterior inferior cerebellar arteries (AICA): Patent origins from the basilar artery. 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 --Superior cerebellar arteries: Normal. --Posterior cerebral arteries: Normal. Both originate from the basilar artery. Posterior communicating arteries (p-comm) are diminutive or absent. ANTERIOR CIRCULATION: --Intracranial internal carotid arteries: Atherosclerotic calcification of the internal carotid arteries at the skull base without hemodynamically significant stenosis. 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 Patent distal branches. Right MCA is normal. VENOUS SINUSES: As permitted by contrast timing, patent. ANATOMIC VARIANTS: None Review of the MIP images confirms the above findings. IMPRESSION: 1. No emergent large vessel occlusion. 2. 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 3. 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 4. Bilateral carotid calcific atherosclerosis with 50% stenosis of the proximal right ICA. 5. Aortic Atherosclerosis (ICD10-I70.0). Electronically Signed   By: Ulyses Jarred M.D.   On: 07/23/2019 01:26   Mr Brain Wo Contrast  Result Date: 07/23/2019 CLINICAL DATA:  Hypertension. Stroke. Right arm and leg numbness in weakness. EXAM: MRI HEAD  WITHOUT CONTRAST TECHNIQUE: Multiplanar, multiecho pulse sequences of the brain and surrounding structures were obtained without intravenous contrast. COMPARISON:  CT studies earlier same day FINDINGS: Brain: Diffusion imaging shows acute infarction in the distal left anterior cerebral artery territory. No evidence of mass effect or hemorrhage. No other vascular territory insult. Elsewhere, there chronic small-vessel ischemic changes throughout the pons. No focal cerebellar insult. Cerebral hemispheres show chronic small-vessel ischemic changes of the thalami, basal ganglia and hemispheric deep white matter. No hydrocephalus or extra-axial  collection. Vascular: Major vessels at the base of the brain show flow. Skull and upper cervical spine: Negative Sinuses/Orbits: Clear/normal Other: None IMPRESSION: Areas of acute infarction throughout the distal left anterior cerebral artery territory. Mild swelling but no mass effect or hemorrhage. Chronic small-vessel ischemic changes elsewhere throughout the brain as outlined above. Electronically Signed   By: Nelson Chimes M.D.   On: 07/23/2019 09:45   Ct Head Code Stroke Wo Contrast  Result Date: 07/23/2019 CLINICAL DATA:  Code stroke. Right-sided facial paralysis. Last seen normal 6 p.m. EXAM: CT HEAD WITHOUT CONTRAST TECHNIQUE: Contiguous axial images were obtained from the base of the skull through the vertex without intravenous contrast. COMPARISON:  Brain MRI 09/26/2016 FINDINGS: Brain: There is no mass, hemorrhage or extra-axial collection. There is generalized atrophy without lobar predilection. There is hypoattenuation of the periventricular white matter, most commonly indicating chronic ischemic microangiopathy. 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 Vascular: No abnormal hyperdensity of the major intracranial arteries or dural venous sinuses. No intracranial atherosclerosis. Skull: The visualized skull base, calvarium and extracranial soft tissues are normal. Sinuses/Orbits: No fluid levels or advanced mucosal thickening of the visualized paranasal sinuses. No mastoid or middle ear effusion. The orbits are normal. ASPECTS [facility] Stroke Program Early CT Score) - Ganglionic level infarction (caudate, lentiform nuclei, internal capsule, insula, M1-M3 cortex): 7 - Supraganglionic infarction (M4-M6 cortex): 3 Total score (0-10 with 10 being normal): 10 IMPRESSION: 1. No intracranial hemorrhage. 2. 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 3. ASPECTS is 10. 4. These results were communicated to Dr. Karena Addison Aroor at 12:57 am on 07/23/2019 by  text page via the [facility] messaging system. Electronically Signed   By: Ulyses Jarred M.D.   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 Does the need for close, 24 hr/day medical supervision in concert with the patient's rehab needs make it unreasonable for this patient to be served in a less intensive setting? Yes 2. Co-Morbidities requiring supervision/potential complications: ESRD on HD 3. Due to bladder management, bowel management, safety, skin/wound care, disease management, medication administration, pain management and patient education, does the patient require 24 hr/day rehab nursing? Yes 4. Does the patient require coordinated care of a physician, rehab nurse, therapy disciplines of PT, OT< SLP to address physical and functional deficits in the context of the above medical diagnosis(es)? Yes Addressing deficits in the following areas: balance, endurance, locomotion, strength, transferring, bowel/bladder control, bathing, dressing, feeding, grooming, toileting, cognition and psychosocial support 5. Can the patient actively participate in an intensive therapy program of at least 3 hrs of therapy per day at least 5 days per week? Yes 6. The potential for patient to make measurable gains while on inpatient rehab is good 7. Anticipated functional outcomes upon discharge from inpatient rehab are min assist  with PT, min assist with OT, min assist with SLP. 8. Estimated rehab length of  stay to reach the above functional goals is: 18-21d 9. Anticipated discharge destination: Home 10. Overall Rehab/Functional Prognosis: good  RECOMMENDATIONS: This patient's condition is appropriate for continued rehabilitative care in the following setting: CIR Patient has agreed to participate in recommended program. Yes Note that insurance prior authorization may be required for reimbursement for recommended care.  Comment: should be  ready in am "I have personally performed a face to face diagnostic evaluation of this patient.  Additionally, I have reviewed and concur with the physician assistant's documentation above." Cathlyn Parsons, PA-C 07/24/2019   Charlett Blake M.D. [facility] FAAPM&R (Sports Med, Neuromuscular Med) Diplomate [place] Board of Electrodiagnostic Med

## 2019-07-23 NOTE — ED Notes (Signed)
Date and time results received: 07/23/19 0533   Test: Calcium and Potassium Critical Value: 5.7 & 2.4  Name of Provider Notified: Maudie Mercury, MD  Orders Received? Or Actions Taken?: Calcium Gluconate for Calcium level

## 2019-07-23 NOTE — Progress Notes (Signed)
PIV consult: Noted no IV meds ordered. Discussed with RN: She will enter STAT consult if IV meds are needed.

## 2019-07-23 NOTE — Progress Notes (Signed)
Rehab Admissions Coordinator Note:  Patient was screened by Raechel Ache for appropriateness for an Inpatient Acute Rehab Consult.    At this time, we are recommending Inpatient Rehab consult. I will place order in chart to allow for further assessment.   Raechel Ache 07/23/2019, 1:01 PM  I can be reached at [phone].

## 2019-07-23 NOTE — ED Notes (Signed)
LVO = 0 NIHHS = 9

## 2019-07-23 NOTE — ED Notes (Signed)
Pt taken to MRI  

## 2019-07-23 NOTE — ED Notes (Signed)
Admitting paged to RN per his request  

## 2019-07-23 NOTE — H&P (Signed)
TRH H&P    Patient Demographics:    [name], is a 78 y.o. female  MRN: [id number]  DOB - [birth year]  Admit Date - 07/23/2019  Referring MD/NP/PA:   Dina Rich  Outpatient Primary MD for the patient is [name], MD  Patient coming from:  home  Chief complaint- right arm, and leg numbness, weakness   HPI:    [name]  is a 78 y.o. female,  w hypertension, hyperlipidemia, Dm2, w neuropathy, ESRD on HD MWF, CAD, Aortic stenosis, h/o stroke apparently presents with c/o right arm and leg numbness, weakness  In ED,  T 98 P 82 R 18, Bp 171/101  Pox 99% on RA Wt 51.9, kg  CT brain , CTA brain/ neck IMPRESSION: 1. No intracranial hemorrhage. 2. 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 3. ASPECTS is 10.  CTA brain/ neck IMPRESSION: 1. No emergent large vessel occlusion. 2. 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 3. 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 4. Bilateral carotid calcific atherosclerosis with 50% stenosis of the proximal right ICA. 5. Aortic Atherosclerosis (ICD10-I70.0).  INR 1.0 Wbc 9.9, Hgb 12.6, Plt 125 Na 136, K 4.5,  Bun 22, Creatinine 4.26 Ast 28, Alt 20 Alb 3.2   Neurology consulted by ED, appreciate input  Pt will be admitted for new stroke     Review of systems:    In addition to the HPI above,  No Fever-chills, No Headache, No changes with Vision or hearing, No problems swallowing food or Liquids, No Chest pain, Cough or Shortness of Breath, No Abdominal pain, No Nausea or Vomiting, bowel movements are regular, No Blood in stool or Urine, No dysuria, No new skin rashes or bruises, No new joints pains-aches,    No recent weight gain or loss, No polyuria, polydypsia or polyphagia, No significant Mental Stressors.  All other systems reviewed and are negative.    Past History of  the following :    Past Medical History:  Diagnosis Date   Anemia    Pt is taking iron.    Anxiety    Arthritis    Carotid stenosis    40-59% bilateral ICA stenosis in 2/12.   Chronic low back pain    CKD (chronic kidney disease)    Dr. Audie Clear at [facility]   Coronary artery disease    Pt presented 2/10 to [facility] with NSTEMI and diastolic CHF exacerbation.  LHC was done  3/10 showing 99% pRCA stenosis and 80% calcified pLAD stenosis with L=>R collaterals.  Pt was referred  for CABG which was done by Dr. Prescott Gum with LIMA-LAD, SVG-RCA, SVG-OM.   Diabetes mellitus    Diabetic neuropathy (HCC)    Diastolic CHF (HCC)    Echo (2/10) showed EF 55-65%, mild LVH, diastolic dysfunction, mild AS with mean gradient 12 mmHg, PASP 43 mmHg.  Echo (2/12): EF 55-60%, mild LVH, mild AS (mean gradient 12), PA systolic pressure 32 mmHg.      GERD (gastroesophageal reflux  disease)    Heart murmur    Hyperlipidemia    Hypertension    Mild aortic stenosis    mean gradient 12 mmHg in 2/12.   Myocardial infarction ([place])    "mild"   Pneumonia    PONV (postoperative nausea and vomiting)    Stroke ([place])    " mild"   Thrombocytopenia ([place])    Unsteady gait       Past Surgical History:  Procedure Laterality Date   AORTIC ARCH ANGIOGRAPHY N/A 10/18/2018   Procedure: AORTIC ARCH ANGIOGRAPHY;  Surgeon: Marty Heck, MD;  Location: [place] [facility];  Service: Cardiovascular;  Laterality: N/A;   AV FISTULA PLACEMENT Left 01/30/2015   Procedure: Creation of a Radial Cephalic AV Fistula left wrist;  Surgeon: [name], MD;  Location: [place];  Service: Vascular;  Laterality: Left;   AV FISTULA PLACEMENT Left 09/06/2018   Procedure: LEFT ARM ARTERIOVENOUS (AV) FISTULA CREATION;  Surgeon: Marty Heck, MD;  Location: [place];  Service: Vascular;  Laterality: Left;   BACK SURGERY     multiple   BREAST SURGERY     biopsy   CARDIAC CATHETERIZATION       CATARACT EXTRACTION W/ INTRAOCULAR LENS  IMPLANT, BILATERAL     COLONOSCOPY Left 11/03/2016   Procedure: COLONOSCOPY;  Surgeon: [name], MD;  Location: [place];  Service: Endoscopy;  Laterality: Left;   COLONOSCOPY W/ BIOPSIES AND POLYPECTOMY     CORONARY ARTERY BYPASS GRAFT  09/2008   pt with NSTEMI and diastolic CHF exacerbation.  LHC was done  3/10 showing 99% pRCA stenosis and 80% calcified pLAD stenosis with L=>R collaterals.  Pt was referred  for CABG which was done by Dr. Prescott Gum with LIMA-LAD, SVG-RCA, SVG-OM.   ESOPHAGOGASTRODUODENOSCOPY N/A 11/02/2016   Procedure: ESOPHAGOGASTRODUODENOSCOPY (EGD);  Surgeon: [name], MD;  Location: [facility];  Service: Endoscopy;  Laterality: N/A;   GIVENS CAPSULE STUDY N/A 11/29/2016   Procedure: GIVENS CAPSULE STUDY;  Surgeon: [name], MD;  Location: [place];  Service: Endoscopy;  Laterality: N/A;   LIGATION OF ARTERIOVENOUS  FISTULA Left 09/13/2018   Procedure: LIGATION OF LEFT ARTERIOVENOUS  FISTULA;  Surgeon: Waynetta Sandy, MD;  Location: [place];  Service: Vascular;  Laterality: Left;   REVISON OF ARTERIOVENOUS FISTULA Left 07/02/2015   Procedure: REVISON OF LEFT RADIOCEPHALIC ARTERIOVENOUS FISTULA;  Surgeon: [name], MD;  Location: [place];  Service: Vascular;  Laterality: Left;   RIGHT/LEFT HEART CATH AND CORONARY/GRAFT ANGIOGRAPHY N/A 05/18/2018   Procedure: RIGHT/LEFT HEART CATH AND CORONARY/GRAFT ANGIOGRAPHY;  Surgeon: Leonie Man, MD;  Location: [place] [facility];  Service: Cardiovascular;  Laterality: N/A;   UPPER EXTREMITY ANGIOGRAPHY Left 10/18/2018   Procedure: UPPER EXTREMITY ANGIOGRAPHY;  Surgeon: Marty Heck, MD;  Location: [place] [facility];  Service: Cardiovascular;  Laterality: Left;      Social History:      Social History   Tobacco Use   Smoking status: Former Smoker    Types: Cigarettes   Smokeless tobacco: Never Used   Tobacco comment: quit 1988   Substance Use Topics   Alcohol use: Yes    Comment: daily       Family History :     Family History  Adopted: Yes  Family history unknown: Yes     Home Medications:   Prior to Admission medications   Medication Sig Start Date End Date Taking? Authorizing Provider  acetaminophen (TYLENOL) 500 MG tablet Take  1,000 mg by mouth 3 (three) times daily as needed for moderate pain.    [provider]  ALPRAZolam Duanne Moron) 1 MG tablet Take 1 mg by mouth at bedtime.  11/18/17   [provider]  AMITIZA 24 MCG capsule Take 24 mcg by mouth 2 (two) times daily as needed for constipation.  06/05/17   [provider]  b complex-vitamin c-folic acid (NEPHRO-VITE) 0.8 MG TABS tablet Take 1 tablet by mouth daily. 05/01/18   [provider]  calcitRIOL (ROCALTROL) 0.5 MCG capsule Take 0.5 mcg by mouth daily. 04/09/18   [provider]  diphenhydrAMINE (BENADRYL) 25 MG tablet Take 25 mg by mouth daily as needed for allergies.    [provider]  folic acid (FOLVITE) 1 MG tablet Take 1 tablet (1 mg total) by mouth daily. 04/24/18   [name], MD  midodrine (PROAMATINE) 10 MG tablet Take 10 mg by mouth 3 (three) times a week. Patient takes on dialysis days, M,W,F    [provider]  rosuvastatin (CRESTOR) 5 MG tablet TAKE 1 TABLET BY MOUTH EVERYDAY AT BEDTIME 06/27/19   [name], NP  sevelamer carbonate (RENVELA) 800 MG tablet Take 1,600 mg 3 (three) times daily with meals by mouth.  12/06/16   [provider]  vitamin B-12 (CYANOCOBALAMIN) 500 MCG tablet Take 1 tablet (500 mcg total) by mouth daily. 04/23/18   [name], MD     Allergies:     Allergies  Allergen Reactions   Doxycycline Nausea And Vomiting    Caused "DEATHLY NAUSEA AND VOMITING"   Lipitor [Atorvastatin] Other (See Comments)    Stomach pain   Strawberry Extract Rash     Physical Exam:   Vitals  Blood pressure (!) 168/75, pulse 73, temperature  98 F (36.7 C), temperature source Oral, resp. rate 16, weight 51.9 kg, SpO2 98 %.  1.  General: Axoxo3,   2. Psychiatric: euthymic  3. Neurologic: Cn 2-12 intact, reflexes 3+ patellar, with upgoing to on the right, motor 5/5 in the left upper and lower ext,  Pt has 0/5 motor in the right upper ext and 5-/5 in the right distal lower ext   4. HEENMT:  Anicteric, pupils 1.44mm symmetric, direct, consensual, intact Neck: no jvd  5. Respiratory : CTAB  6. Cardiovascular : rrr s1, s2, no m/g/r  7. Gastrointestinal:  Abd: soft, nt, nd, +bs  8. Skin:  Ext: no c/c/e, no rash  9.Musculoskeletal:  Good ROM      Data Review:    CBC Recent Labs  Lab 07/23/19 0038 07/23/19 0051  WBC 9.9  --   HGB 12.6 13.9  HCT 40.8 41.0  PLT 125*  --   MCV 112.7*  --   MCH 34.8*  --   MCHC 30.9  --   RDW 17.2*  --   LYMPHSABS 1.3  --   MONOABS 0.9  --   EOSABS 0.1  --   BASOSABS 0.0  --    ------------------------------------------------------------------------------------------------------------------  Results for orders placed or performed during the hospital encounter of 07/23/19 (from the past 48 hour(s))  Protime-INR     Status: None   Collection Time: 07/23/19 12:38 AM  Result Value Ref Range   Prothrombin Time 13.0 11.4 - 15.2 seconds   INR 1.0 0.8 - 1.2    Comment: (NOTE) INR goal varies based on device and disease states. Performed at [facility], [place] [address], [place], [place] [address]  APTT     Status: None   Collection Time: 07/23/19 12:38 AM  Result Value Ref Range   aPTT 29 24 - 36 seconds    Comment: Performed at [facility] [address] [place], [place] [address]  CBC     Status: Abnormal   Collection Time: 07/23/19 12:38 AM  Result Value Ref Range   WBC 9.9 4.0 - 10.5 K/uL   RBC 3.62 (L) 3.87 - 5.11 MIL/uL   Hemoglobin 12.6 12.0 - 15.0 g/dL   HCT 40.8 36.0 - 46.0 %   MCV 112.7 (H) 80.0 - 100.0 fL   MCH 34.8 (H) 26.0 - 34.0 pg    MCHC 30.9 30.0 - 36.0 g/dL   RDW 17.2 (H) 11.5 - 15.5 %   Platelets 125 (L) 150 - 400 K/uL    Comment: REPEATED TO VERIFY Immature Platelet Fraction may be clinically indicated, consider ordering this additional test [id number]    nRBC 0.2 0.0 - 0.2 %    Comment: Performed at [facility], [place] [address], [place], [place] [address]  Differential     Status: None   Collection Time: 07/23/19 12:38 AM  Result Value Ref Range   Neutrophils Relative % 76 %   Neutro Abs 7.5 1.7 - 7.7 K/uL   Lymphocytes Relative 13 %   Lymphs Abs 1.3 0.7 - 4.0 K/uL   Monocytes Relative 9 %   Monocytes Absolute 0.9 0.1 - 1.0 K/uL   Eosinophils Relative 1 %   Eosinophils Absolute 0.1 0.0 - 0.5 K/uL   Basophils Relative 0 %   Basophils Absolute 0.0 0.0 - 0.1 K/uL   Smear Review MORPHOLOGY UNREMARKABLE    Immature Granulocytes 1 %   Abs Immature Granulocytes 0.06 0.00 - 0.07 K/uL    Comment: Performed at [facility] [address] [place], [place] [address]  Comprehensive metabolic panel     Status: Abnormal   Collection Time: 07/23/19 12:38 AM  Result Value Ref Range   Sodium 136 135 - 145 mmol/L   Potassium 4.5 3.5 - 5.1 mmol/L   Chloride 97 (L) 98 - 111 mmol/L   CO2 25 22 - 32 mmol/L   Glucose, Bld 156 (H) 70 - 99 mg/dL   BUN 22 8 - 23 mg/dL   Creatinine, Ser 4.26 (H) 0.44 - 1.00 mg/dL   Calcium 9.1 8.9 - 10.3 mg/dL   Total Protein 7.1 6.5 - 8.1 g/dL   Albumin 3.2 (L) 3.5 - 5.0 g/dL   AST 28 15 - 41 U/L   ALT 20 0 - 44 U/L   Alkaline Phosphatase 98 38 - 126 U/L   Total Bilirubin 0.9 0.3 - 1.2 mg/dL   GFR calc non Af Amer 9 (L) >60 mL/min   GFR calc Af Amer 11 (L) >60 mL/min   Anion gap 14 5 - 15    Comment: Performed at [facility], [place] [address] [place], [place] [address]  I-stat chem 8, ED     Status: Abnormal   Collection Time: 07/23/19 12:51 AM  Result Value Ref Range   Sodium 135 135 - 145 mmol/L   Potassium 4.4 3.5 - 5.1 mmol/L   Chloride 100 98 - 111 mmol/L     BUN 23 8 - 23 mg/dL   Creatinine, Ser 4.20 (H) 0.44 - 1.00 mg/dL   Glucose, Bld 153 (H) 70 - 99 mg/dL   Calcium, Ion 1.08 (L) 1.15 - 1.40 mmol/L  TCO2 27 22 - 32 mmol/L   Hemoglobin 13.9 12.0 - 15.0 g/dL   HCT 41.0 36.0 - 46.0 %    Chemistries  Recent Labs  Lab 07/23/19 0038 07/23/19 0051  NA 136 135  K 4.5 4.4  CL 97* 100  CO2 25  --   GLUCOSE 156* 153*  BUN 22 23  CREATININE 4.26* 4.20*  CALCIUM 9.1  --   AST 28  --   ALT 20  --   ALKPHOS 98  --   BILITOT 0.9  --    ------------------------------------------------------------------------------------------------------------------  ------------------------------------------------------------------------------------------------------------------ GFR: Estimated Creatinine Clearance: 9.2 mL/min (A) (by C-G formula based on SCr of 4.2 mg/dL (H)). Liver Function Tests: Recent Labs  Lab 07/23/19 0038  AST 28  ALT 20  ALKPHOS 98  BILITOT 0.9  PROT 7.1  ALBUMIN 3.2*   No results for input(s): LIPASE, AMYLASE in the last 168 hours. No results for input(s): AMMONIA in the last 168 hours. Coagulation Profile: Recent Labs  Lab 07/23/19 0038  INR 1.0   Cardiac Enzymes: No results for input(s): CKTOTAL, CKMB, CKMBINDEX, TROPONINI in the last 168 hours. BNP (last 3 results) No results for input(s): PROBNP in the last 8760 hours. HbA1C: No results for input(s): HGBA1C in the last 72 hours. CBG: No results for input(s): GLUCAP in the last 168 hours. Lipid Profile: No results for input(s): CHOL, HDL, LDLCALC, TRIG, CHOLHDL, LDLDIRECT in the last 72 hours. Thyroid Function Tests: No results for input(s): TSH, T4TOTAL, FREET4, T3FREE, THYROIDAB in the last 72 hours. Anemia Panel: No results for input(s): VITAMINB12, FOLATE, FERRITIN, TIBC, IRON, RETICCTPCT in the last 72 hours.  --------------------------------------------------------------------------------------------------------------- Urine analysis:     Component Value Date/Time   COLORURINE YELLOW 09/26/2016 1744   APPEARANCEUR TURBID (A) 09/26/2016 1744   LABSPEC 1.005 09/26/2016 1744   PHURINE 5.0 09/26/2016 1744   GLUCOSEU NEGATIVE 09/26/2016 1744   HGBUR SMALL (A) 09/26/2016 1744   BILIRUBINUR NEGATIVE 09/26/2016 1744   KETONESUR NEGATIVE 09/26/2016 1744   PROTEINUR 100 (A) 09/26/2016 1744   UROBILINOGEN 0.2 10/14/2008 0612   NITRITE NEGATIVE 09/26/2016 1744   LEUKOCYTESUR LARGE (A) 09/26/2016 1744      Imaging Results:    Ct Angio Head W Or Wo Contrast  Result Date: 07/23/2019 CLINICAL DATA:  Right-sided paralysis EXAM: CT ANGIOGRAPHY HEAD AND NECK TECHNIQUE: Multidetector CT imaging of the head and neck was performed using the standard protocol during bolus administration of intravenous contrast. Multiplanar CT image reconstructions and MIPs were obtained to evaluate the vascular anatomy. Carotid stenosis measurements (when applicable) are obtained utilizing NASCET criteria, using the distal internal carotid diameter as the denominator. CONTRAST:  53mL OMNIPAQUE IOHEXOL 350 MG/ML SOLN COMPARISON:  Head CT same day FINDINGS: CTA NECK FINDINGS SKELETON: There is no bony spinal canal stenosis. No lytic or blastic lesion. OTHER NECK: Normal pharynx, larynx and major salivary glands. No cervical lymphadenopathy. Unremarkable thyroid gland. UPPER CHEST: No pneumothorax or pleural effusion. No nodules or masses. AORTIC ARCH: There is moderate calcific atherosclerosis of the aortic arch. There is no aneurysm, dissection or hemodynamically significant stenosis of the visualized portion of the aorta. Conventional 3 vessel aortic branching pattern. 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 RIGHT CAROTID SYSTEM: There is atherosclerotic calcification within the right common carotid artery. There is bulky calcific atherosclerosis at the carotid bifurcation extending into the proximal right ICA resulting in approximately 50% stenosis.  LEFT CAROTID SYSTEM: There is atherosclerotic calcification within the common carotid artery and at  the carotid bifurcation. There is no flow-limiting stenosis. VERTEBRAL ARTERIES: Left dominant configuration. Both origins are clearly patent. There is multifocal calcification within both vertebral arteries which remain patent to the vertebrobasilar confluence. CTA HEAD FINDINGS POSTERIOR CIRCULATION: --Vertebral arteries: Atherosclerotic calcification within the left vertebral artery without stenosis. The right vertebral artery terminates in PICA. --Posterior inferior cerebellar arteries (PICA): Patent origins from the vertebral arteries. --Anterior inferior cerebellar arteries (AICA): Patent origins from the basilar artery. 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 --Superior cerebellar arteries: Normal. --Posterior cerebral arteries: Normal. Both originate from the basilar artery. Posterior communicating arteries (p-comm) are diminutive or absent. ANTERIOR CIRCULATION: --Intracranial internal carotid arteries: Atherosclerotic calcification of the internal carotid arteries at the skull base without hemodynamically significant stenosis. 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 Patent distal branches. Right MCA is normal. VENOUS SINUSES: As permitted by contrast timing, patent. ANATOMIC VARIANTS: None Review of the MIP images confirms the above findings. IMPRESSION: 1. No emergent large vessel occlusion. 2. 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 3. 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 4. Bilateral carotid calcific atherosclerosis with 50% stenosis of the proximal right ICA. 5. Aortic Atherosclerosis (ICD10-I70.0). Electronically Signed   By: Ulyses Jarred M.D.   On: 07/23/2019 01:26   Dg Chest 2 View  Result  Date: 07/23/2019 CLINICAL DATA:  Facial droop and right-sided paralysis. EXAM: CHEST - 2 VIEW COMPARISON:  Radiograph 05/29/2018 FINDINGS: Right dialysis catheter tip at the atrial caval junction. Post median sternotomy and CABG. Decreased cardiomegaly from prior exam. Aortic atherosclerosis and tortuosity. Bilateral pleural effusions have resolved. Improved interstitial thickening. Mild underlying interstitial coarsening which may be chronic. No focal airspace disease. No pleural fluid or pneumothorax. No acute osseous abnormalities are seen. IMPRESSION: 1. Mild interstitial coarsening, favor this is chronic rather than pulmonary edema. 2. No other acute findings. 3. Dialysis catheter tip at the atrial caval junction. Electronically Signed   By: Keith Rake M.D.   On: 07/23/2019 02:45   Ct Angio Neck W Or Wo Contrast  Result Date: 07/23/2019 CLINICAL DATA:  Right-sided paralysis EXAM: CT ANGIOGRAPHY HEAD AND NECK TECHNIQUE: Multidetector CT imaging of the head and neck was performed using the standard protocol during bolus administration of intravenous contrast. Multiplanar CT image reconstructions and MIPs were obtained to evaluate the vascular anatomy. Carotid stenosis measurements (when applicable) are obtained utilizing NASCET criteria, using the distal internal carotid diameter as the denominator. CONTRAST:  46mL OMNIPAQUE IOHEXOL 350 MG/ML SOLN COMPARISON:  Head CT same day FINDINGS: CTA NECK FINDINGS SKELETON: There is no bony spinal canal stenosis. No lytic or blastic lesion. OTHER NECK: Normal pharynx, larynx and major salivary glands. No cervical lymphadenopathy. Unremarkable thyroid gland. UPPER CHEST: No pneumothorax or pleural effusion. No nodules or masses. AORTIC ARCH: There is moderate calcific atherosclerosis of the aortic arch. There is no aneurysm, dissection or hemodynamically significant stenosis of the visualized portion of the aorta. Conventional 3 vessel aortic branching pattern.  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 RIGHT CAROTID SYSTEM: There is atherosclerotic calcification within the right common carotid artery. There is bulky calcific atherosclerosis at the carotid bifurcation extending into the proximal right ICA resulting in approximately 50% stenosis. LEFT CAROTID SYSTEM: There is atherosclerotic calcification within the common carotid artery and at the carotid bifurcation. There is no flow-limiting stenosis. VERTEBRAL ARTERIES: Left dominant configuration. Both origins  are clearly patent. There is multifocal calcification within both vertebral arteries which remain patent to the vertebrobasilar confluence. CTA HEAD FINDINGS POSTERIOR CIRCULATION: --Vertebral arteries: Atherosclerotic calcification within the left vertebral artery without stenosis. The right vertebral artery terminates in PICA. --Posterior inferior cerebellar arteries (PICA): Patent origins from the vertebral arteries. --Anterior inferior cerebellar arteries (AICA): Patent origins from the basilar artery. 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 --Superior cerebellar arteries: Normal. --Posterior cerebral arteries: Normal. Both originate from the basilar artery. Posterior communicating arteries (p-comm) are diminutive or absent. ANTERIOR CIRCULATION: --Intracranial internal carotid arteries: Atherosclerotic calcification of the internal carotid arteries at the skull base without hemodynamically significant stenosis. 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 Patent distal branches. Right MCA is normal. VENOUS SINUSES: As permitted by contrast timing, patent. ANATOMIC VARIANTS: None Review of the MIP images confirms the above findings. IMPRESSION: 1. No emergent large vessel occlusion. 2. Moderate-to-severe stenosis of the left MCA M1 segment, which remains  patent. 3. 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 4. Bilateral carotid calcific atherosclerosis with 50% stenosis of the proximal right ICA. 5. Aortic Atherosclerosis (ICD10-I70.0). Electronically Signed   By: Ulyses Jarred M.D.   On: 07/23/2019 01:26   Ct Head Code Stroke Wo Contrast  Result Date: 07/23/2019 CLINICAL DATA:  Code stroke. Right-sided facial paralysis. Last seen normal 6 p.m. EXAM: CT HEAD WITHOUT CONTRAST TECHNIQUE: Contiguous axial images were obtained from the base of the skull through the vertex without intravenous contrast. COMPARISON:  Brain MRI 09/26/2016 FINDINGS: Brain: There is no mass, hemorrhage or extra-axial collection. There is generalized atrophy without lobar predilection. There is hypoattenuation of the periventricular white matter, most commonly indicating chronic ischemic microangiopathy. 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 Vascular: No abnormal hyperdensity of the major intracranial arteries or dural venous sinuses. No intracranial atherosclerosis. Skull: The visualized skull base, calvarium and extracranial soft tissues are normal. Sinuses/Orbits: No fluid levels or advanced mucosal thickening of the visualized paranasal sinuses. No mastoid or middle ear effusion. The orbits are normal. ASPECTS [facility] Stroke Program Early CT Score) - Ganglionic level infarction (caudate, lentiform nuclei, internal capsule, insula, M1-M3 cortex): 7 - Supraganglionic infarction (M4-M6 cortex): 3 Total score (0-10 with 10 being normal): 10 IMPRESSION: 1. No intracranial hemorrhage. 2. 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 3. ASPECTS is 10. 4. These results were communicated to Dr. Karena Addison Aroor at 12:57 am on 07/23/2019 by text page via the [facility] messaging system. Electronically Signed   By: Ulyses Jarred M.D.   On: 07/23/2019 00:57       Assessment & Plan:    Principal Problem:   Stroke [facility]) Active  Problems:   Diabetes mellitus type II, non insulin dependent ([place])   Essential hypertension   ESRD (end stage renal disease) on dialysis ([place])  Suspect New Acute Stroke MRI brain Check cardiac echo Check hga1c, lipid PT/OT/Speech therapy Aspirin 325mg  po qday (NOTE that patient states hx of PUD and bleeding ulcer from aspirin in the past), might need to change to plavix  Increase Crestor to 20mg  po qhs Neurology consulted by ED, appreciate input  Anxiety Cont Xanax 1mg  po qhs  ESRD on HD MWF Cont Renvela 1,600mg  po tid Cont Rocaltrol 0.5 micrograms po qday Cont Proamatine 10mg  prior to dialysis  Constipation Cont Amitiza 24 micrograms po qday  DVT Prophylaxis-   Lovenox - SCDs  AM Labs Ordered, also please review Full Orders  Family Communication: Admission, patients condition and plan of care including tests being ordered have been discussed with the patient  who indicate understanding and agree with the plan and Code Status.  Code Status:  FULL CODE per patient  Admission status: Observation: Based on patients clinical presentation and evaluation of above clinical data, I have made determination that patient meets observation criteria at this time. Pt may require change to inpatient stay due to stroke.    Time spent in minutes : 55   Jani Gravel M.D on 07/23/2019 at 3:31 AM

## 2019-07-23 NOTE — Plan of Care (Signed)
  Problem: Education: Goal: Knowledge of General Education information will improve Description: Including pain rating scale, medication(s)/side effects and non-pharmacologic comfort measures Outcome: Progressing   Problem: Health Behavior/Discharge Planning: Goal: Ability to manage health-related needs will improve Outcome: Progressing   Problem: Clinical Measurements: Goal: Ability to maintain clinical measurements within normal limits will improve Outcome: Progressing Goal: Will remain free from infection Outcome: Progressing Goal: Diagnostic test results will improve Outcome: Progressing Goal: Respiratory complications will improve Outcome: Progressing Goal: Cardiovascular complication will be avoided Outcome: Progressing   Problem: Activity: Goal: Risk for activity intolerance will decrease Outcome: Progressing   Problem: Nutrition: Goal: Adequate nutrition will be maintained Outcome: Progressing   Problem: Coping: Goal: Level of anxiety will decrease Outcome: Progressing   Problem: Elimination: Goal: Will not experience complications related to bowel motility Outcome: Progressing Goal: Will not experience complications related to urinary retention Outcome: Progressing   Problem: Pain Managment: Goal: General experience of comfort will improve Outcome: Progressing   Problem: Safety: Goal: Ability to remain free from injury will improve Outcome: Progressing   Problem: Skin Integrity: Goal: Risk for impaired skin integrity will decrease Outcome: Progressing   Problem: Education: Goal: Knowledge of secondary prevention will improve Outcome: Progressing Goal: Knowledge of patient specific risk factors addressed and post discharge goals established will improve Outcome: Progressing Goal: Individualized Educational Video(s) Outcome: Progressing   Problem: Coping: Goal: Will identify appropriate support needs Outcome: Progressing   Problem:  Self-Care: Goal: Verbalization of feelings and concerns over difficulty with self-care will improve Outcome: Progressing Goal: Ability to communicate needs accurately will improve Outcome: Progressing   Ival Bible, BSN, RN

## 2019-07-23 NOTE — ED Notes (Signed)
Tele   Breakfast ordered  

## 2019-07-23 NOTE — Consult Note (Addendum)
[facility] Renal Consultation Note    Indication for Consultation:  Management of ESRD/hemodialysis, anemia, hypertension/volume, and secondary hyperparathyroidism. PCP:  HPI: [name] is a 78 y.o. female with ESRD, HTN, T2DM, Hx carotid stenosis, CAD (Hx CABG), GERD, Hx CVA who was admitted with acute CVA.  Presented to ED around midnight 12/8 via EMS with code stroke. Had developed acute R sided weakness at some point after her dialysis on 12/7 afternoon. Per husband's report, somewhere between 6-7p. tPA not given as felt to be outside treatment window. Initial head CT negative. Neuro consulted. MRI this morning with acute CVA in distal L anterior cerebral artery. Work-up ongoing.  Seen in room - cannot move R arm or leg - smile improved. Able to speak normally. Labs 12/8 initially with K 4.4, Hgb 12.9. Repeat labs show K 2.4, Hgb 7.8 - unsure which is accurate, assume the former. Denies CP/dyspnea.  Dialyzes on MWF at [facility] - s/p full dialysis on 12/7 - due tomorrow.  Past Medical History:  Diagnosis Date  . Anemia    Pt is taking iron.   [name] Anxiety   . Arthritis   . Carotid stenosis    40-59% bilateral ICA stenosis in 2/12.  . Chronic low back pain   . CKD (chronic kidney disease)    Dr. Audie Clear at [facility]  . Coronary artery disease    Pt presented 2/10 to [facility] with NSTEMI and diastolic CHF exacerbation.  LHC was done  3/10 showing 99% pRCA stenosis and 80% calcified pLAD stenosis with L=>R collaterals.  Pt was referred  for CABG which was done by Dr. Prescott Gum with LIMA-LAD, SVG-RCA, SVG-OM.  . Diabetes mellitus   . Diabetic neuropathy ([place])   . Diastolic CHF (HCC)    Echo (2/10) showed EF 55-65%, mild LVH, diastolic dysfunction, mild AS with mean gradient 12 mmHg, PASP 43 mmHg.  Echo (2/12): EF 55-60%, mild LVH, mild AS (mean gradient 12), PA systolic pressure 32 mmHg.     [name] GERD (gastroesophageal reflux disease)   . Heart murmur   .  Hyperlipidemia   . Hypertension   . Mild aortic stenosis    mean gradient 12 mmHg in 2/12.  . Myocardial infarction ([place])    "mild"  . Pneumonia   . PONV (postoperative nausea and vomiting)   . Stroke [facility])    " mild"  . Thrombocytopenia ([place])   . Unsteady gait    Past Surgical History:  Procedure Laterality Date  . AORTIC ARCH ANGIOGRAPHY N/A 10/18/2018   Procedure: AORTIC ARCH ANGIOGRAPHY;  Surgeon: Marty Heck, MD;  Location: [place] CV LAB;  Service: Cardiovascular;  Laterality: N/A;  . AV FISTULA PLACEMENT Left 01/30/2015   Procedure: Creation of a Radial Cephalic AV Fistula left wrist;  Surgeon: [name], MD;  Location: [place];  Service: Vascular;  Laterality: Left;  . AV FISTULA PLACEMENT Left 09/06/2018   Procedure: LEFT ARM ARTERIOVENOUS (AV) FISTULA CREATION;  Surgeon: Marty Heck, MD;  Location: [place];  Service: Vascular;  Laterality: Left;  . BACK SURGERY     multiple  . BREAST SURGERY     biopsy  . CARDIAC CATHETERIZATION    . CATARACT EXTRACTION W/ INTRAOCULAR LENS  IMPLANT, BILATERAL    . COLONOSCOPY Left 11/03/2016   Procedure: COLONOSCOPY;  Surgeon: [name], MD;  Location: [facility] ENDOSCOPY;  Service: Endoscopy;  Laterality: Left;  . COLONOSCOPY W/ BIOPSIES AND POLYPECTOMY    . CORONARY  ARTERY BYPASS GRAFT  09/2008   pt with NSTEMI and diastolic CHF exacerbation.  LHC was done  3/10 showing 99% pRCA stenosis and 80% calcified pLAD stenosis with L=>R collaterals.  Pt was referred  for CABG which was done by Dr. Prescott Gum with LIMA-LAD, SVG-RCA, SVG-OM.  [name] ESOPHAGOGASTRODUODENOSCOPY N/A 11/02/2016   Procedure: ESOPHAGOGASTRODUODENOSCOPY (EGD);  Surgeon: [name], MD;  Location: [facility] ENDOSCOPY;  Service: Endoscopy;  Laterality: N/A;  . GIVENS CAPSULE STUDY N/A 11/29/2016   Procedure: GIVENS CAPSULE STUDY;  Surgeon: [name], MD;  Location: [place];  Service: Endoscopy;  Laterality: N/A;  . LIGATION OF ARTERIOVENOUS  FISTULA Left 09/13/2018    Procedure: LIGATION OF LEFT ARTERIOVENOUS  FISTULA;  Surgeon: Waynetta Sandy, MD;  Location: [place];  Service: Vascular;  Laterality: Left;  . REVISON OF ARTERIOVENOUS FISTULA Left 07/02/2015   Procedure: REVISON OF LEFT RADIOCEPHALIC ARTERIOVENOUS FISTULA;  Surgeon: [name], MD;  Location: [place];  Service: Vascular;  Laterality: Left;  . RIGHT/LEFT HEART CATH AND CORONARY/GRAFT ANGIOGRAPHY N/A 05/18/2018   Procedure: RIGHT/LEFT HEART CATH AND CORONARY/GRAFT ANGIOGRAPHY;  Surgeon: Leonie Man, MD;  Location: [place] CV LAB;  Service: Cardiovascular;  Laterality: N/A;  . UPPER EXTREMITY ANGIOGRAPHY Left 10/18/2018   Procedure: UPPER EXTREMITY ANGIOGRAPHY;  Surgeon: Marty Heck, MD;  Location: [place] CV LAB;  Service: Cardiovascular;  Laterality: Left;   Family History  Adopted: Yes  Family history unknown: Yes   Social History:  reports that she has quit smoking. Her smoking use included cigarettes. She has never used smokeless tobacco. She reports current alcohol use. She reports that she does not use drugs.  ROS: As per HPI otherwise negative.  Physical Exam: Vitals:   07/23/19 1130 07/23/19 1200 07/23/19 1230 07/23/19 1329  BP: (!) 144/64 (!) 164/67 (!) 142/66 (!) 157/64  Pulse: 83 91 85 89  Resp: (!) 22 20 (!) 22 20  Temp:    98.6 F (37 C)  TempSrc:    Oral  SpO2: 97% 99% 95% 96%  Weight:         General: Well developed, well nourished, in no acute distress. Head: Normocephalic, atraumatic, sclera non-icteric, mucus membranes are moist. Neck: Supple without lymphadenopathy/masses. JVD not elevated. Lungs: Clear bilaterally to auscultation without wheezes, rales, or rhonchi. Breathing is unlabored. Heart: RRR; 3/6 murmur Abdomen: Soft, non-tender, non-distended with normoactive bowel sounds. No rebound/guarding.  Musculoskeletal: R hemiplegia - cannot lift R arm or leg Lower extremities: No LE edema or visible wounds. Neuro: Alert and  oriented X 3. Smile intact, R hemiplegia. Psych:  Responds to questions appropriately with a normal affect. Dialysis Access: [facility] R chest without tenderness  Allergies  Allergen Reactions  . Doxycycline Nausea And Vomiting    Caused "DEATHLY NAUSEA AND VOMITING"  . Lipitor [Atorvastatin] Other (See Comments)    Stomach pain  . Strawberry Extract Rash   Prior to Admission medications   Medication Sig Start Date End Date Taking? Authorizing Provider  acetaminophen (TYLENOL) 500 MG tablet Take 1,000 mg by mouth 3 (three) times daily as needed for moderate pain.   Yes [provider]  ALPRAZolam Duanne Moron) 1 MG tablet Take 1 mg by mouth at bedtime.  11/18/17  Yes [provider]  AMITIZA 24 MCG capsule Take 24 mcg by mouth 2 (two) times daily as needed for constipation.  06/05/17  Yes [provider]  b complex-vitamin c-folic acid (NEPHRO-VITE) 0.8 MG TABS tablet Take 1 tablet by mouth  daily. 05/01/18  Yes [provider]  calcitRIOL (ROCALTROL) 0.5 MCG capsule Take 0.5 mcg by mouth daily. 04/09/18  Yes [provider]  diphenhydrAMINE (BENADRYL) 25 MG tablet Take 25 mg by mouth daily as needed for allergies.   Yes [provider]  folic acid (FOLVITE) 1 MG tablet Take 1 tablet (1 mg total) by mouth daily. 04/24/18  Yes [name], MD  gabapentin (NEURONTIN) 100 MG capsule Take 100 mg by mouth at bedtime. 07/23/19  Yes [provider]  midodrine (PROAMATINE) 10 MG tablet Take 10 mg by mouth 3 (three) times a week. Patient takes on dialysis days, M,W,F   Yes [provider]  rosuvastatin (CRESTOR) 5 MG tablet TAKE 1 TABLET BY MOUTH EVERYDAY AT BEDTIME Patient taking differently: Take 5 mg by mouth at bedtime.  06/27/19  Yes [name], NP  sevelamer carbonate (RENVELA) 800 MG tablet Take 1,600 mg 3 (three) times daily with meals by mouth.  12/06/16  Yes [provider]  vitamin B-12 (CYANOCOBALAMIN) 500 MCG tablet  Take 1 tablet (500 mcg total) by mouth daily. 04/23/18  Yes [name], MD   Current Facility-Administered Medications  Medication Dose Route Frequency Provider Last Rate Last Dose  . acetaminophen (TYLENOL) tablet 650 mg  650 mg Oral Q4H PRN Jani Gravel, MD       Or  . acetaminophen (TYLENOL) 160 MG/5ML solution 650 mg  650 mg Per Tube Q4H PRN Jani Gravel, MD       Or  . acetaminophen (TYLENOL) suppository 650 mg  650 mg Rectal Q4H PRN Jani Gravel, MD      . ALPRAZolam Duanne Moron) tablet 1 mg  1 mg Oral QHS Jani Gravel, MD      . aspirin EC tablet 81 mg  81 mg Oral Daily Burnetta Sabin L, NP      . calcitRIOL (ROCALTROL) capsule 0.5 mcg  0.5 mcg Oral Daily Jani Gravel, MD      . clopidogrel (PLAVIX) tablet 75 mg  75 mg Oral Daily Burnetta Sabin L, NP   75 mg at 07/23/19 1228  . diphenhydrAMINE (BENADRYL) capsule 25 mg  25 mg Oral Daily PRN Jani Gravel, MD      . folic acid (FOLVITE) tablet 1 mg  1 mg Oral Daily Jani Gravel, MD   1 mg at 07/23/19 1043  . lubiprostone (AMITIZA) capsule 24 mcg  24 mcg Oral Q breakfast Jani Gravel, MD   24 mcg at 07/23/19 1228  . [START ON 07/24/2019] midodrine (PROAMATINE) tablet 10 mg  10 mg Oral Q M,W,F-HD Jani Gravel, MD      . multivitamin (RENA-VIT) tablet 1 tablet  1 tablet Oral QHS Jani Gravel, MD      . rosuvastatin (CRESTOR) tablet 20 mg  20 mg Oral q1800 Jani Gravel, MD      . sevelamer carbonate (RENVELA) tablet 1,600 mg  1,600 mg Oral TID WC Jani Gravel, MD   1,600 mg at 07/23/19 1229  . sodium chloride flush (NS) 0.9 % injection 3 mL  3 mL Intravenous Once Horton, Barbette Hair, MD      . vitamin B-12 (CYANOCOBALAMIN) tablet 500 mcg  500 mcg Oral Daily Jani Gravel, MD   500 mcg at 07/23/19 1043   Labs: Basic Metabolic Panel: Recent Labs  Lab 07/23/19 0038 07/23/19 0051 07/23/19 0420  NA 136 135 139  K 4.5 4.4 2.4*  CL 97* 100 114*  CO2 25  --  19*  GLUCOSE 156* 153* 100*  BUN 22 23 18   CREATININE 4.26* 4.20* 2.92*  CALCIUM 9.1  --  5.7*   Liver Function  Tests: Recent Labs  Lab 07/23/19 0038 07/23/19 0420  AST 28 15  ALT 20 11  ALKPHOS 98 52  BILITOT 0.9 0.2*  PROT 7.1 4.0*  ALBUMIN 3.2* 1.9*   CBC: Recent Labs  Lab 07/23/19 0038 07/23/19 0051 07/23/19 0420  WBC 9.9  --  6.9  NEUTROABS 7.5  --   --   HGB 12.6 13.9 7.8*  HCT 40.8 41.0 26.0*  MCV 112.7*  --  115.0*  PLT 125*  --  90*   CBG: Recent Labs  Lab 07/23/19 0043  GLUCAP 139*   Studies/Results: Ct Angio Head W Or Wo Contrast  Result Date: 07/23/2019 CLINICAL DATA:  Right-sided paralysis EXAM: CT ANGIOGRAPHY HEAD AND NECK TECHNIQUE: Multidetector CT imaging of the head and neck was performed using the standard protocol during bolus administration of intravenous contrast. Multiplanar CT image reconstructions and MIPs were obtained to evaluate the vascular anatomy. Carotid stenosis measurements (when applicable) are obtained utilizing NASCET criteria, using the distal internal carotid diameter as the denominator. CONTRAST:  63mL OMNIPAQUE IOHEXOL 350 MG/ML SOLN COMPARISON:  Head CT same day FINDINGS: CTA NECK FINDINGS SKELETON: There is no bony spinal canal stenosis. No lytic or blastic lesion. OTHER NECK: Normal pharynx, larynx and major salivary glands. No cervical lymphadenopathy. Unremarkable thyroid gland. UPPER CHEST: No pneumothorax or pleural effusion. No nodules or masses. AORTIC ARCH: There is moderate calcific atherosclerosis of the aortic arch. There is no aneurysm, dissection or hemodynamically significant stenosis of the visualized portion of the aorta. Conventional 3 vessel aortic branching pattern. 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 RIGHT CAROTID SYSTEM: There is atherosclerotic calcification within the right common carotid artery. There is bulky calcific atherosclerosis at the carotid bifurcation extending into the proximal right ICA resulting in approximately 50% stenosis. LEFT CAROTID SYSTEM: There is atherosclerotic calcification within  the common carotid artery and at the carotid bifurcation. There is no flow-limiting stenosis. VERTEBRAL ARTERIES: Left dominant configuration. Both origins are clearly patent. There is multifocal calcification within both vertebral arteries which remain patent to the vertebrobasilar confluence. CTA HEAD FINDINGS POSTERIOR CIRCULATION: --Vertebral arteries: Atherosclerotic calcification within the left vertebral artery without stenosis. The right vertebral artery terminates in PICA. --Posterior inferior cerebellar arteries (PICA): Patent origins from the vertebral arteries. --Anterior inferior cerebellar arteries (AICA): Patent origins from the basilar artery. 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 --Superior cerebellar arteries: Normal. --Posterior cerebral arteries: Normal. Both originate from the basilar artery. Posterior communicating arteries (p-comm) are diminutive or absent. ANTERIOR CIRCULATION: --Intracranial internal carotid arteries: Atherosclerotic calcification of the internal carotid arteries at the skull base without hemodynamically significant stenosis. 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 Patent distal branches. Right MCA is normal. VENOUS SINUSES: As permitted by contrast timing, patent. ANATOMIC VARIANTS: None Review of the MIP images confirms the above findings. IMPRESSION: 1. No emergent large vessel occlusion. 2. 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 3. 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 4. Bilateral carotid calcific atherosclerosis with 50% stenosis of the proximal right ICA. 5. Aortic Atherosclerosis (ICD10-I70.0). Electronically Signed   By: Ulyses Jarred M.D.   On: 07/23/2019 01:26   Dg Chest 2 View  Result Date: 07/23/2019 CLINICAL DATA:  Facial  droop and right-sided  paralysis. EXAM: CHEST - 2 VIEW COMPARISON:  Radiograph 05/29/2018 FINDINGS: Right dialysis catheter tip at the atrial caval junction. Post median sternotomy and CABG. Decreased cardiomegaly from prior exam. Aortic atherosclerosis and tortuosity. Bilateral pleural effusions have resolved. Improved interstitial thickening. Mild underlying interstitial coarsening which may be chronic. No focal airspace disease. No pleural fluid or pneumothorax. No acute osseous abnormalities are seen. IMPRESSION: 1. Mild interstitial coarsening, favor this is chronic rather than pulmonary edema. 2. No other acute findings. 3. Dialysis catheter tip at the atrial caval junction. Electronically Signed   By: Keith Rake M.D.   On: 07/23/2019 02:45   Ct Angio Neck W Or Wo Contrast  Result Date: 07/23/2019 CLINICAL DATA:  Right-sided paralysis EXAM: CT ANGIOGRAPHY HEAD AND NECK TECHNIQUE: Multidetector CT imaging of the head and neck was performed using the standard protocol during bolus administration of intravenous contrast. Multiplanar CT image reconstructions and MIPs were obtained to evaluate the vascular anatomy. Carotid stenosis measurements (when applicable) are obtained utilizing NASCET criteria, using the distal internal carotid diameter as the denominator. CONTRAST:  52mL OMNIPAQUE IOHEXOL 350 MG/ML SOLN COMPARISON:  Head CT same day FINDINGS: CTA NECK FINDINGS SKELETON: There is no bony spinal canal stenosis. No lytic or blastic lesion. OTHER NECK: Normal pharynx, larynx and major salivary glands. No cervical lymphadenopathy. Unremarkable thyroid gland. UPPER CHEST: No pneumothorax or pleural effusion. No nodules or masses. AORTIC ARCH: There is moderate calcific atherosclerosis of the aortic arch. There is no aneurysm, dissection or hemodynamically significant stenosis of the visualized portion of the aorta. Conventional 3 vessel aortic branching pattern. 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 RIGHT  CAROTID SYSTEM: There is atherosclerotic calcification within the right common carotid artery. There is bulky calcific atherosclerosis at the carotid bifurcation extending into the proximal right ICA resulting in approximately 50% stenosis. LEFT CAROTID SYSTEM: There is atherosclerotic calcification within the common carotid artery and at the carotid bifurcation. There is no flow-limiting stenosis. VERTEBRAL ARTERIES: Left dominant configuration. Both origins are clearly patent. There is multifocal calcification within both vertebral arteries which remain patent to the vertebrobasilar confluence. CTA HEAD FINDINGS POSTERIOR CIRCULATION: --Vertebral arteries: Atherosclerotic calcification within the left vertebral artery without stenosis. The right vertebral artery terminates in PICA. --Posterior inferior cerebellar arteries (PICA): Patent origins from the vertebral arteries. --Anterior inferior cerebellar arteries (AICA): Patent origins from the basilar artery. 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 --Superior cerebellar arteries: Normal. --Posterior cerebral arteries: Normal. Both originate from the basilar artery. Posterior communicating arteries (p-comm) are diminutive or absent. ANTERIOR CIRCULATION: --Intracranial internal carotid arteries: Atherosclerotic calcification of the internal carotid arteries at the skull base without hemodynamically significant stenosis. 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 Patent distal branches. Right MCA is normal. VENOUS SINUSES: As permitted by contrast timing, patent. ANATOMIC VARIANTS: None Review of the MIP images confirms the above findings. IMPRESSION: 1. No emergent large vessel occlusion. 2. 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 3. Severe stenosis of the proximal left subclavian  artery. 4. Bilateral carotid calcific atherosclerosis with 50% stenosis of the proximal right ICA. 5. Aortic Atherosclerosis (ICD10-I70.0). Electronically Signed   By: Ulyses Jarred M.D.   On: 07/23/2019 01:26   Mr Brain Wo Contrast  Result Date: 07/23/2019 CLINICAL DATA:  Hypertension. Stroke. Right arm and leg numbness in weakness. EXAM: MRI HEAD WITHOUT CONTRAST TECHNIQUE:  Multiplanar, multiecho pulse sequences of the brain and surrounding structures were obtained without intravenous contrast. COMPARISON:  CT studies earlier same day FINDINGS: Brain: Diffusion imaging shows acute infarction in the distal left anterior cerebral artery territory. No evidence of mass effect or hemorrhage. No other vascular territory insult. Elsewhere, there chronic small-vessel ischemic changes throughout the pons. No focal cerebellar insult. Cerebral hemispheres show chronic small-vessel ischemic changes of the thalami, basal ganglia and hemispheric deep white matter. No hydrocephalus or extra-axial collection. Vascular: Major vessels at the base of the brain show flow. Skull and upper cervical spine: Negative Sinuses/Orbits: Clear/normal Other: None IMPRESSION: Areas of acute infarction throughout the distal left anterior cerebral artery territory. Mild swelling but no mass effect or hemorrhage. Chronic small-vessel ischemic changes elsewhere throughout the brain as outlined above. Electronically Signed   By: Nelson Chimes M.D.   On: 07/23/2019 09:45   Ct Head Code Stroke Wo Contrast  Result Date: 07/23/2019 CLINICAL DATA:  Code stroke. Right-sided facial paralysis. Last seen normal 6 p.m. EXAM: CT HEAD WITHOUT CONTRAST TECHNIQUE: Contiguous axial images were obtained from the base of the skull through the vertex without intravenous contrast. COMPARISON:  Brain MRI 09/26/2016 FINDINGS: Brain: There is no mass, hemorrhage or extra-axial collection. There is generalized atrophy without lobar predilection. There is  hypoattenuation of the periventricular white matter, most commonly indicating chronic ischemic microangiopathy. 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 Vascular: No abnormal hyperdensity of the major intracranial arteries or dural venous sinuses. No intracranial atherosclerosis. Skull: The visualized skull base, calvarium and extracranial soft tissues are normal. Sinuses/Orbits: No fluid levels or advanced mucosal thickening of the visualized paranasal sinuses. No mastoid or middle ear effusion. The orbits are normal. ASPECTS [facility] Stroke Program Early CT Score) - Ganglionic level infarction (caudate, lentiform nuclei, internal capsule, insula, M1-M3 cortex): 7 - Supraganglionic infarction (M4-M6 cortex): 3 Total score (0-10 with 10 being normal): 10 IMPRESSION: 1. No intracranial hemorrhage. 2. 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 3. ASPECTS is 10. 4. These results were communicated to Dr. Karena Addison Aroor at 12:57 am on 07/23/2019 by text page via the [place] messaging system. Electronically Signed   By: Ulyses Jarred M.D.   On: 07/23/2019 00:57   Dialysis Orders:  MWF at [place] - last full HD 12/7 4hr, 300/600, EDW 51.5kg, 2K/2.25Ca, TDC, UFP #4, heparin 1000 - Mircera 200 q 2 weeks  Assessment/Plan: 1.  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 Work-up ongoing. R hemiparesis, speech intact. 2.  ESRD: Continue HD per MWF sched - HD tomorrow. Will avoid intradialytic hypotension. No heparin. 3.  Hypertension/volume: Permissive HTN per neuro. 4.  Anemia: Hgb 7.8 - ?unclear if error. Huge drop from prior - will repeat and follow. 5.  Metabolic bone disease: Follow labs. 6. T2DM 7.  CAD (Hx CABG)  Veneta Penton, PA-C 07/23/2019, 2:23 PM  [facility] Pager: [phone]  Pt seen, examined and agree w A/P as above. ESRD pt w/ acute CVA, for HD tomorrow. BP's stable. Small UF planned.  Kelly Splinter  MD 07/23/2019, 4:33  PM

## 2019-07-23 NOTE — ED Triage Notes (Signed)
[facility] new onset Right Sides Paralysis, Facial Droop, and Right contraction. LSW is 1800 on 07/22/2019. LVO 4

## 2019-07-23 NOTE — Progress Notes (Signed)
  Echocardiogram 2D Echocardiogram has been performed.  [name] 07/23/2019, 5:56 PM

## 2019-07-23 NOTE — ED Notes (Signed)
Pt arrived in room from CT

## 2019-07-23 NOTE — Progress Notes (Signed)
STROKE TEAM PROGRESS NOTE   INTERVAL HISTORY Patient is still in the ED. Just back from MRI which was positive for stroke.  I have personally reviewed history of presenting illness with the patient, electronic medical records and imaging films in PACS.  Vitals:   07/23/19 0730 07/23/19 0813 07/23/19 0830 07/23/19 1033  BP: 136/84 (!) 154/69 (!) 170/80 (!) 161/75  Pulse: 70 77 88 95  Resp: 15 18 (!) 21 18  Temp:      TempSrc:      SpO2: 99% 100% 100% 100%  Weight:        CBC:  Recent Labs  Lab 07/23/19 0038 07/23/19 0051 07/23/19 0420  WBC 9.9  --  6.9  NEUTROABS 7.5  --   --   HGB 12.6 13.9 7.8*  HCT 40.8 41.0 26.0*  MCV 112.7*  --  115.0*  PLT 125*  --  90*    Basic Metabolic Panel:  Recent Labs  Lab 07/23/19 0038 07/23/19 0051 07/23/19 0420  NA 136 135 139  K 4.5 4.4 2.4*  CL 97* 100 114*  CO2 25  --  19*  GLUCOSE 156* 153* 100*  BUN 22 23 18   CREATININE 4.26* 4.20* 2.92*  CALCIUM 9.1  --  5.7*   Lipid Panel:     Component Value Date/Time   CHOL 59 07/23/2019 0420   CHOL 110 06/26/2018 0936   TRIG 74 07/23/2019 0420   HDL 28 (L) 07/23/2019 0420   HDL 59 06/26/2018 0936   CHOLHDL 2.1 07/23/2019 0420   VLDL 15 07/23/2019 0420   LDLCALC 16 07/23/2019 0420   LDLCALC 26 06/26/2018 0936   HgbA1c:  Lab Results  Component Value Date   HGBA1C 5.3 07/23/2019   Urine Drug Screen: No results found for: LABOPIA, COCAINSCRNUR, LABBENZ, AMPHETMU, THCU, LABBARB  Alcohol Level No results found for: ETH  IMAGING Ct Angio Head W Or Wo Contrast  Result Date: 07/23/2019 CLINICAL DATA:  Right-sided paralysis EXAM: CT ANGIOGRAPHY HEAD AND NECK TECHNIQUE: Multidetector CT imaging of the head and neck was performed using the standard protocol during bolus administration of intravenous contrast. Multiplanar CT image reconstructions and MIPs were obtained to evaluate the vascular anatomy. Carotid stenosis measurements (when applicable) are obtained utilizing NASCET  criteria, using the distal internal carotid diameter as the denominator. CONTRAST:  57mL OMNIPAQUE IOHEXOL 350 MG/ML SOLN COMPARISON:  Head CT same day FINDINGS: CTA NECK FINDINGS SKELETON: There is no bony spinal canal stenosis. No lytic or blastic lesion. OTHER NECK: Normal pharynx, larynx and major salivary glands. No cervical lymphadenopathy. Unremarkable thyroid gland. UPPER CHEST: No pneumothorax or pleural effusion. No nodules or masses. AORTIC ARCH: There is moderate calcific atherosclerosis of the aortic arch. There is no aneurysm, dissection or hemodynamically significant stenosis of the visualized portion of the aorta. Conventional 3 vessel aortic branching pattern. 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 RIGHT CAROTID SYSTEM: There is atherosclerotic calcification within the right common carotid artery. There is bulky calcific atherosclerosis at the carotid bifurcation extending into the proximal right ICA resulting in approximately 50% stenosis. LEFT CAROTID SYSTEM: There is atherosclerotic calcification within the common carotid artery and at the carotid bifurcation. There is no flow-limiting stenosis. VERTEBRAL ARTERIES: Left dominant configuration. Both origins are clearly patent. There is multifocal calcification within both vertebral arteries which remain patent to the vertebrobasilar confluence. CTA HEAD FINDINGS POSTERIOR CIRCULATION: --Vertebral arteries: Atherosclerotic calcification within the left vertebral artery without stenosis. The right vertebral artery terminates in PICA. --  Posterior inferior cerebellar arteries (PICA): Patent origins from the vertebral arteries. --Anterior inferior cerebellar arteries (AICA): Patent origins from the basilar artery. 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 --Superior cerebellar arteries: Normal. --Posterior cerebral arteries: Normal. Both originate from the basilar artery. Posterior communicating arteries  (p-comm) are diminutive or absent. ANTERIOR CIRCULATION: --Intracranial internal carotid arteries: Atherosclerotic calcification of the internal carotid arteries at the skull base without hemodynamically significant stenosis. 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 Patent distal branches. Right MCA is normal. VENOUS SINUSES: As permitted by contrast timing, patent. ANATOMIC VARIANTS: None Review of the MIP images confirms the above findings. IMPRESSION: 1. No emergent large vessel occlusion. 2. 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 3. 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 4. Bilateral carotid calcific atherosclerosis with 50% stenosis of the proximal right ICA. 5. Aortic Atherosclerosis (ICD10-I70.0). Electronically Signed   By: Ulyses Jarred M.D.   On: 07/23/2019 01:26   Dg Chest 2 View  Result Date: 07/23/2019 CLINICAL DATA:  Facial droop and right-sided paralysis. EXAM: CHEST - 2 VIEW COMPARISON:  Radiograph 05/29/2018 FINDINGS: Right dialysis catheter tip at the atrial caval junction. Post median sternotomy and CABG. Decreased cardiomegaly from prior exam. Aortic atherosclerosis and tortuosity. Bilateral pleural effusions have resolved. Improved interstitial thickening. Mild underlying interstitial coarsening which may be chronic. No focal airspace disease. No pleural fluid or pneumothorax. No acute osseous abnormalities are seen. IMPRESSION: 1. Mild interstitial coarsening, favor this is chronic rather than pulmonary edema. 2. No other acute findings. 3. Dialysis catheter tip at the atrial caval junction. Electronically Signed   By: Keith Rake M.D.   On: 07/23/2019 02:45   Ct Angio Neck W Or Wo Contrast  Result Date: 07/23/2019 CLINICAL DATA:  Right-sided paralysis EXAM: CT ANGIOGRAPHY HEAD AND NECK TECHNIQUE: Multidetector CT  imaging of the head and neck was performed using the standard protocol during bolus administration of intravenous contrast. Multiplanar CT image reconstructions and MIPs were obtained to evaluate the vascular anatomy. Carotid stenosis measurements (when applicable) are obtained utilizing NASCET criteria, using the distal internal carotid diameter as the denominator. CONTRAST:  50mL OMNIPAQUE IOHEXOL 350 MG/ML SOLN COMPARISON:  Head CT same day FINDINGS: CTA NECK FINDINGS SKELETON: There is no bony spinal canal stenosis. No lytic or blastic lesion. OTHER NECK: Normal pharynx, larynx and major salivary glands. No cervical lymphadenopathy. Unremarkable thyroid gland. UPPER CHEST: No pneumothorax or pleural effusion. No nodules or masses. AORTIC ARCH: There is moderate calcific atherosclerosis of the aortic arch. There is no aneurysm, dissection or hemodynamically significant stenosis of the visualized portion of the aorta. Conventional 3 vessel aortic branching pattern. 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 RIGHT CAROTID SYSTEM: There is atherosclerotic calcification within the right common carotid artery. There is bulky calcific atherosclerosis at the carotid bifurcation extending into the proximal right ICA resulting in approximately 50% stenosis. LEFT CAROTID SYSTEM: There is atherosclerotic calcification within the common carotid artery and at the carotid bifurcation. There is no flow-limiting stenosis. VERTEBRAL ARTERIES: Left dominant configuration. Both origins are clearly patent. There is multifocal calcification within both vertebral arteries which remain patent to the vertebrobasilar confluence. CTA HEAD FINDINGS POSTERIOR CIRCULATION: --Vertebral arteries: Atherosclerotic calcification within the left vertebral artery without stenosis. The right vertebral artery terminates in PICA. --Posterior inferior cerebellar arteries (PICA): Patent origins from the vertebral arteries. --Anterior  inferior cerebellar  arteries (AICA): Patent origins from the basilar artery. 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 --Superior cerebellar arteries: Normal. --Posterior cerebral arteries: Normal. Both originate from the basilar artery. Posterior communicating arteries (p-comm) are diminutive or absent. ANTERIOR CIRCULATION: --Intracranial internal carotid arteries: Atherosclerotic calcification of the internal carotid arteries at the skull base without hemodynamically significant stenosis. 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 Patent distal branches. Right MCA is normal. VENOUS SINUSES: As permitted by contrast timing, patent. ANATOMIC VARIANTS: None Review of the MIP images confirms the above findings. IMPRESSION: 1. No emergent large vessel occlusion. 2. 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 3. 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 4. Bilateral carotid calcific atherosclerosis with 50% stenosis of the proximal right ICA. 5. Aortic Atherosclerosis (ICD10-I70.0). Electronically Signed   By: Ulyses Jarred M.D.   On: 07/23/2019 01:26   Mr Brain Wo Contrast  Result Date: 07/23/2019 CLINICAL DATA:  Hypertension. Stroke. Right arm and leg numbness in weakness. EXAM: MRI HEAD WITHOUT CONTRAST TECHNIQUE: Multiplanar, multiecho pulse sequences of the brain and surrounding structures were obtained without intravenous contrast. COMPARISON:  CT studies earlier same day FINDINGS: Brain: Diffusion imaging shows acute infarction in the distal left anterior cerebral artery territory. No evidence of mass effect or hemorrhage. No other vascular territory insult. Elsewhere, there chronic small-vessel ischemic changes throughout the pons. No focal cerebellar insult. Cerebral hemispheres show chronic small-vessel ischemic  changes of the thalami, basal ganglia and hemispheric deep white matter. No hydrocephalus or extra-axial collection. Vascular: Major vessels at the base of the brain show flow. Skull and upper cervical spine: Negative Sinuses/Orbits: Clear/normal Other: None IMPRESSION: Areas of acute infarction throughout the distal left anterior cerebral artery territory. Mild swelling but no mass effect or hemorrhage. Chronic small-vessel ischemic changes elsewhere throughout the brain as outlined above. Electronically Signed   By: Nelson Chimes M.D.   On: 07/23/2019 09:45   Ct Head Code Stroke Wo Contrast  Result Date: 07/23/2019 CLINICAL DATA:  Code stroke. Right-sided facial paralysis. Last seen normal 6 p.m. EXAM: CT HEAD WITHOUT CONTRAST TECHNIQUE: Contiguous axial images were obtained from the base of the skull through the vertex without intravenous contrast. COMPARISON:  Brain MRI 09/26/2016 FINDINGS: Brain: There is no mass, hemorrhage or extra-axial collection. There is generalized atrophy without lobar predilection. There is hypoattenuation of the periventricular white matter, most commonly indicating chronic ischemic microangiopathy. 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 Vascular: No abnormal hyperdensity of the major intracranial arteries or dural venous sinuses. No intracranial atherosclerosis. Skull: The visualized skull base, calvarium and extracranial soft tissues are normal. Sinuses/Orbits: No fluid levels or advanced mucosal thickening of the visualized paranasal sinuses. No mastoid or middle ear effusion. The orbits are normal. ASPECTS [facility] Stroke Program Early CT Score) - Ganglionic level infarction (caudate, lentiform nuclei, internal capsule, insula, M1-M3 cortex): 7 - Supraganglionic infarction (M4-M6 cortex): 3 Total score (0-10 with 10 being normal): 10 IMPRESSION: 1. No intracranial hemorrhage. 2. 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  3. ASPECTS is 10. 4. These results were communicated to Dr. Karena Addison Aroor at 12:57 am on 07/23/2019 by text page via the [facility] messaging system. Electronically Signed   By: Ulyses Jarred M.D.   On: 07/23/2019 00:57    PHYSICAL EXAM Pleasant frail elderly Caucasian lady not in distress. [name]  Afebrile. Head is nontraumatic. Neck is supple without bruit.    Cardiac exam no murmur or gallop. Lungs are clear to auscultation. Distal pulses are well felt. Neurological Exam ; Awake alert oriented to time place and person.  No dysarthria aphasia or apraxia.  Extraocular movements are full range without nystagmus.  Blinks to threat bilaterally.  Mild right lower facial weakness.  Tongue midline.  Motor system exam reveals significant right hemiplegia with right upper extremity 1/5 strength in right lower extremity 0/5 strength with only trace withdrawal to painful stimuli.  Normal strength on the left upper and lower extremities.  Tone is reduced on the right.  Slight diminished touch pinprick sensation in the right upper and lower extremities.  Right plantar upgoing left downgoing.  Gait not tested.   ASSESSMENT/PLAN [name] is a 78 y.o. female with history of bilateral carotid stenosis, coronary artery disease, CKD now on dialysis, diabetes mellitus, diastolic congestive heart failure, prior stroke, thrombocytopenia presenting with R sided weakness.    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  Code Stroke CT head No acute abnormality. Small vessel disease. Atrophy. ASPECTS 10.     CTA head & neck no LVO. Moderate to severe L M1 stenosis. Severe stenosis L subclavian artery. B ICA atherosclerosis w/ proximal R ICA 50 stenosis. Aortic atherosclerosis.   MRI  Distal L ACA territory infarct. Small vessel disease.   2D Echo pending   LDL 16  HgbA1c 5.3  SCDs for VTE prophylaxis  No  antithrombotic prior to admission, now on aspirin 325 mg daily. Changed aspirin to 81 EC given hx GIB. Will treat with both aspirin and plavix x 3 weeks then continue EC aspirin 81 mg alone. Patient now agreeable (had previously refused aspirin administration)  Therapy recommendations:  Pending. Will likely need CIR  Disposition:  pending   Hypertension  Stable . Permissive hypertension (OK if < 220/120) but gradually normalize in 5-7 days . 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 Hb stable   Anxiety on xanax  ESRD on HD MWF  Constipation  Hospital day # 0  She presented with right hemiplegia secondary to left anterior cerebral artery infarct etiology unclear possibly embolic but she has significant left middle cerebral artery as well as extracranial carotid atherosclerotic disease as well.  She has remote history of right thalamic lacunar infarct in 2018 also from small vessel disease.  Recommend aspirin Plavix for 3 weeks followed by aspirin.  Continue ongoing stroke work-up.  Aggressive risk factor modification.  Physical occupational therapy and rehab consults.  Long discussion with patient and answered questions.  Greater than 50% time during this 35-minute visit was spent on counseling and coordination of care about the stroke and discussion with care team.  Discussed with Dr. Theresa Duty, MD To contact  Stroke Continuity provider, please refer to  [email or web address] After hours, contact General Neurology

## 2019-07-23 NOTE — Progress Notes (Addendum)
Patient admitted after midnight. Care began before midnight. Hx esrd mwf dialysis, dm2, cad, stroke admitted right sided weakness. Workup reveals areas of acute infarction throughout the distal left anterior cerebral artery territory. Mild swelling but no mass effect or hemorrhage.  Nephrology notified.  Stroke work up in progress Stroke team on board.     Jeanne Glad black, np   Patient came from HD after an unresponsive episode.  On MRI found to have: Areas of acute infarction throughout the distal left anterior cerebral artery territory.  Neurology consulted and work up in progress.  PT recommends CIR.  Eulogio Bear DO

## 2019-07-23 NOTE — ED Notes (Signed)
Lunch Tray Ordered @ 1042. 

## 2019-07-23 NOTE — ED Provider Notes (Signed)
[facility] EMERGENCY DEPARTMENT Provider Note   CSN: [id number] Arrival date & time: 07/23/19  [id number]  An emergency department physician performed an initial assessment on this suspected stroke patient at 0039.  History   Chief Complaint Chief Complaint  Patient presents with   Code Stroke    HPI [name] is a 78 y.o. female.     HPI  Patient evaluated upon arrival as a code stroke.  Cleared for CT scan.  In brief this is a 78 year old female with history of end-stage renal disease, carotid stenosis, coronary artery disease, diabetes, CHF, TIA who presents with right-sided weakness and left facial droop.  Per EMS last known normal was 1800 yesterday.  Patient was able to provide some history.  Reports that she finished dialysis and "I think I had a stroke."  She reports that she was leaning to the right and could not correct.  No speech difficulties.  Noted by EMS to have right upper extremity contracture and inability to move right upper and right lower extremity.  He also noted a left facial droop.  Level 5 caveat for acuity of condition.  1:56 AM Has been out bedside.  States that he noted that she was acting weird when she left dialysis.  He states that she was having difficulty finding and opening her granola bar.  Patient herself stated that she noted weakness on the right side while she was doing dialysis prior to 6:00 today.  She is not on any blood thinners.  Past Medical History:  Diagnosis Date   Anemia    Pt is taking iron.    Anxiety    Arthritis    Carotid stenosis    40-59% bilateral ICA stenosis in 2/12.   Chronic low back pain    CKD (chronic kidney disease)    Dr. Audie Clear at [facility]   Coronary artery disease    Pt presented 2/10 to [facility] with NSTEMI and diastolic CHF exacerbation.  LHC was done  3/10 showing 99% pRCA stenosis and 80% calcified pLAD stenosis with L=>R collaterals.  Pt was referred  for CABG  which was done by Dr. Prescott Gum with LIMA-LAD, SVG-RCA, SVG-OM.   Diabetes mellitus    Diabetic neuropathy (HCC)    Diastolic CHF (HCC)    Echo (2/10) showed EF 55-65%, mild LVH, diastolic dysfunction, mild AS with mean gradient 12 mmHg, PASP 43 mmHg.  Echo (2/12): EF 55-60%, mild LVH, mild AS (mean gradient 12), PA systolic pressure 32 mmHg.      GERD (gastroesophageal reflux disease)    Heart murmur    Hyperlipidemia    Hypertension    Mild aortic stenosis    mean gradient 12 mmHg in 2/12.   Myocardial infarction [facility])    "mild"   Pneumonia    PONV (postoperative nausea and vomiting)    Stroke ([place])    " mild"   Thrombocytopenia ([place])    Unsteady gait     Patient Active Problem List   Diagnosis Date Noted   Volume overload 05/27/2018   Dyspnea 05/27/2018   Acute respiratory failure with hypoxia ([place]) 05/27/2018   Acute respiratory distress 05/27/2018   Acute encephalopathy 05/27/2018   Aspiration pneumonia ([place]) 05/27/2018   Pressure ulcer 05/19/2018   Acute on chronic combined systolic and diastolic heart failure ([place]) 05/17/2018   LBBB (left bundle branch block) 05/17/2018   Aortic stenosis, moderate 05/17/2018   NSTEMI (non-ST elevated myocardial infarction) ([place]) 05/16/2018  Symptomatic anemia 05/15/2018   Demand ischemia ([place]) 05/15/2018   Protein-calorie malnutrition, severe 04/20/2018   Pressure injury of skin 04/18/2018   GI bleed 10/31/2016   Blood loss anemia 10/31/2016   AKI (acute kidney injury) ([place]) 10/31/2016   CVA (cerebral vascular accident) ([place]) 09/26/2016   ESRD (end stage renal disease) on dialysis ([place]) 06/23/2015   Balance problem 12/22/2013   Edema 12/28/2011   CLAUDICATION 11/01/2010   CAROTID ARTERY STENOSIS 10/29/2009   AORTIC STENOSIS 09/17/2009   Essential hypertension [id number]   DIASTOLIC HEART FAILURE, CHRONIC 11/17/2008   Diabetes mellitus type II, non insulin dependent ([place]) 11/12/2008     Other and unspecified hyperlipidemia 11/12/2008   Anemia, chronic renal failure 11/12/2008   Thrombocytopenia ([place]) 11/12/2008   Anxiety state 11/12/2008   Coronary atherosclerosis 11/12/2008   UNSPEC COMBINED SYSTOLIC&DIASTOLIC HEART FAILURE 0000000   ESOPHAGEAL REFLUX 11/12/2008   Chronic kidney disease (CKD), stage V ([place]) 11/12/2008   LOW BACK PAIN, CHRONIC 11/12/2008   INSOMNIA UNSPECIFIED 11/12/2008    Past Surgical History:  Procedure Laterality Date   AORTIC ARCH ANGIOGRAPHY N/A 10/18/2018   Procedure: AORTIC ARCH ANGIOGRAPHY;  Surgeon: Marty Heck, MD;  Location: [place] [facility];  Service: Cardiovascular;  Laterality: N/A;   AV FISTULA PLACEMENT Left 01/30/2015   Procedure: Creation of a Radial Cephalic AV Fistula left wrist;  Surgeon: [name], MD;  Location: [place];  Service: Vascular;  Laterality: Left;   AV FISTULA PLACEMENT Left 09/06/2018   Procedure: LEFT ARM ARTERIOVENOUS (AV) FISTULA CREATION;  Surgeon: Marty Heck, MD;  Location: [place];  Service: Vascular;  Laterality: Left;   BACK SURGERY     multiple   BREAST SURGERY     biopsy   CARDIAC CATHETERIZATION     CATARACT EXTRACTION W/ INTRAOCULAR LENS  IMPLANT, BILATERAL     COLONOSCOPY Left 11/03/2016   Procedure: COLONOSCOPY;  Surgeon: [name], MD;  Location: [place];  Service: Endoscopy;  Laterality: Left;   COLONOSCOPY W/ BIOPSIES AND POLYPECTOMY     CORONARY ARTERY BYPASS GRAFT  09/2008   pt with NSTEMI and diastolic CHF exacerbation.  LHC was done  3/10 showing 99% pRCA stenosis and 80% calcified pLAD stenosis with L=>R collaterals.  Pt was referred  for CABG which was done by Dr. Prescott Gum with LIMA-LAD, SVG-RCA, SVG-OM.   ESOPHAGOGASTRODUODENOSCOPY N/A 11/02/2016   Procedure: ESOPHAGOGASTRODUODENOSCOPY (EGD);  Surgeon: [name], MD;  Location[facility] ENDOSCOPY;  Service: Endoscopy;  Laterality: N/A;   GIVENS CAPSULE STUDY N/A 11/29/2016   Procedure:  GIVENS CAPSULE STUDY;  Surgeon: [name], MD;  Location: [place];  Service: Endoscopy;  Laterality: N/A;   LIGATION OF ARTERIOVENOUS  FISTULA Left 09/13/2018   Procedure: LIGATION OF LEFT ARTERIOVENOUS  FISTULA;  Surgeon: Waynetta Sandy, MD;  Location: [place];  Service: Vascular;  Laterality: Left;   REVISON OF ARTERIOVENOUS FISTULA Left 07/02/2015   Procedure: REVISON OF LEFT RADIOCEPHALIC ARTERIOVENOUS FISTULA;  Surgeon: [name], MD;  Location: [place];  Service: Vascular;  Laterality: Left;   RIGHT/LEFT HEART CATH AND CORONARY/GRAFT ANGIOGRAPHY N/A 05/18/2018   Procedure: RIGHT/LEFT HEART CATH AND CORONARY/GRAFT ANGIOGRAPHY;  Surgeon: Leonie Man, MD;  Location: [place] CV [facility];  Service: Cardiovascular;  Laterality: N/A;   UPPER EXTREMITY ANGIOGRAPHY Left 10/18/2018   Procedure: UPPER EXTREMITY ANGIOGRAPHY;  Surgeon: Marty Heck, MD;  Location: [place] [facility];  Service: Cardiovascular;  Laterality: Left;     OB History  No obstetric history on file.      Home Medications    Prior to Admission medications   Medication Sig Start Date End Date Taking? Authorizing Provider  acetaminophen (TYLENOL) 500 MG tablet Take 1,000 mg by mouth 3 (three) times daily as needed for moderate pain.    [provider]  ALPRAZolam Duanne Moron) 1 MG tablet Take 1 mg by mouth at bedtime.  11/18/17   [provider]  AMITIZA 24 MCG capsule Take 24 mcg by mouth 2 (two) times daily as needed for constipation.  06/05/17   [provider]  b complex-vitamin c-folic acid (NEPHRO-VITE) 0.8 MG TABS tablet Take 1 tablet by mouth daily. 05/01/18   [provider]  calcitRIOL (ROCALTROL) 0.5 MCG capsule Take 0.5 mcg by mouth daily. 04/09/18   [provider]  diphenhydrAMINE (BENADRYL) 25 MG tablet Take 25 mg by mouth daily as needed for allergies.    [provider]  folic acid (FOLVITE) 1 MG tablet Take 1 tablet (1 mg total) by  mouth daily. 04/24/18   [name], MD  midodrine (PROAMATINE) 10 MG tablet Take 10 mg by mouth 3 (three) times a week. Patient takes on dialysis days, M,W,F    [provider]  rosuvastatin (CRESTOR) 5 MG tablet TAKE 1 TABLET BY MOUTH EVERYDAY AT BEDTIME 06/27/19   [name], NP  sevelamer carbonate (RENVELA) 800 MG tablet Take 1,600 mg 3 (three) times daily with meals by mouth.  12/06/16   [provider]  vitamin B-12 (CYANOCOBALAMIN) 500 MCG tablet Take 1 tablet (500 mcg total) by mouth daily. 04/23/18   [name], MD    Family History Family History  Adopted: Yes  Family history unknown: Yes    Social History Social History   Tobacco Use   Smoking status: Former Smoker    Types: Cigarettes   Smokeless tobacco: Never Used   Tobacco comment: quit 1988  Substance Use Topics   Alcohol use: Yes    Comment: daily   Drug use: No     Allergies   Doxycycline, Lipitor [atorvastatin], and Strawberry extract   Review of Systems Review of Systems  Unable to perform ROS: Acuity of condition  All other systems reviewed and are negative.    Physical Exam Updated Vital Signs BP (!) 174/80 (BP Location: Left Arm)    Pulse 80    Temp 98 F (36.7 C) (Oral)    Resp 18    Wt 51.9 kg    SpO2 97%    BMI 17.40 kg/m   Physical Exam Vitals signs and nursing note reviewed.  Constitutional:      Appearance: She is well-developed.     Comments: ABCs intact, chronically ill-appearing  HENT:     Head: Normocephalic and atraumatic.     Mouth/Throat:     Mouth: Mucous membranes are moist.  Eyes:     Extraocular Movements: Extraocular movements intact.     Pupils: Pupils are equal, round, and reactive to light.  Neck:     Musculoskeletal: Neck supple.  Cardiovascular:     Rate and Rhythm: Normal rate and regular rhythm.     Heart sounds: Normal heart sounds.  Pulmonary:     Effort: Pulmonary effort is normal. No respiratory distress.     Comments:  Tunneled catheter right upper chest Abdominal:     General: Bowel sounds are normal.     Palpations: Abdomen is soft.     Tenderness: There is  no abdominal tenderness.  Musculoskeletal:     Right lower leg: No edema.     Left lower leg: No edema.  Skin:    General: Skin is warm and dry.     Comments: Dark discoloration bilateral lower extremity  Neurological:     Mental Status: She is alert and oriented to person, place, and time.     Comments: Oriented to person and place, subtle left facial droop with tearing of the left eye, extraocular movements intact, fluent speech, contracture noted of the right upper extremity, patient unable to wiggle toes right lower extremity  Psychiatric:        Mood and Affect: Mood normal.      ED Treatments / Results  Labs (all labs ordered are listed, but only abnormal results are displayed) Labs Reviewed  CBC - Abnormal; Notable for the following components:      Result Value   RBC 3.62 (*)    MCV 112.7 (*)    MCH 34.8 (*)    RDW 17.2 (*)    Platelets 125 (*)    All other components within normal limits  COMPREHENSIVE METABOLIC PANEL - Abnormal; Notable for the following components:   Chloride 97 (*)    Glucose, Bld 156 (*)    Creatinine, Ser 4.26 (*)    Albumin 3.2 (*)    GFR calc non Af Amer 9 (*)    GFR calc Af Amer 11 (*)    All other components within normal limits  I-STAT CHEM 8, ED - Abnormal; Notable for the following components:   Creatinine, Ser 4.20 (*)    Glucose, Bld 153 (*)    Calcium, Ion 1.08 (*)    All other components within normal limits  SARS CORONAVIRUS 2 (TAT 6-24 HRS)  PROTIME-INR  APTT  DIFFERENTIAL  CBG MONITORING, ED    EKG None  Radiology Ct Angio Head W Or Wo Contrast  Result Date: 07/23/2019 CLINICAL DATA:  Right-sided paralysis EXAM: CT ANGIOGRAPHY HEAD AND NECK TECHNIQUE: Multidetector CT imaging of the head and neck was performed using the standard protocol during bolus administration of  intravenous contrast. Multiplanar CT image reconstructions and MIPs were obtained to evaluate the vascular anatomy. Carotid stenosis measurements (when applicable) are obtained utilizing NASCET criteria, using the distal internal carotid diameter as the denominator. CONTRAST:  39mL OMNIPAQUE IOHEXOL 350 MG/ML SOLN COMPARISON:  Head CT same day FINDINGS: CTA NECK FINDINGS SKELETON: There is no bony spinal canal stenosis. No lytic or blastic lesion. OTHER NECK: Normal pharynx, larynx and major salivary glands. No cervical lymphadenopathy. Unremarkable thyroid gland. UPPER CHEST: No pneumothorax or pleural effusion. No nodules or masses. AORTIC ARCH: There is moderate calcific atherosclerosis of the aortic arch. There is no aneurysm, dissection or hemodynamically significant stenosis of the visualized portion of the aorta. Conventional 3 vessel aortic branching pattern. 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 RIGHT CAROTID SYSTEM: There is atherosclerotic calcification within the right common carotid artery. There is bulky calcific atherosclerosis at the carotid bifurcation extending into the proximal right ICA resulting in approximately 50% stenosis. LEFT CAROTID SYSTEM: There is atherosclerotic calcification within the common carotid artery and at the carotid bifurcation. There is no flow-limiting stenosis. VERTEBRAL ARTERIES: Left dominant configuration. Both origins are clearly patent. There is multifocal calcification within both vertebral arteries which remain patent to the vertebrobasilar confluence. CTA HEAD FINDINGS POSTERIOR CIRCULATION: --Vertebral arteries: Atherosclerotic calcification within the left vertebral artery without stenosis. The right vertebral artery terminates in PICA. --  Posterior inferior cerebellar arteries (PICA): Patent origins from the vertebral arteries. --Anterior inferior cerebellar arteries (AICA): Patent origins from the basilar artery. 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 --Superior cerebellar arteries: Normal. --Posterior cerebral arteries: Normal. Both originate from the basilar artery. Posterior communicating arteries (p-comm) are diminutive or absent. ANTERIOR CIRCULATION: --Intracranial internal carotid arteries: Atherosclerotic calcification of the internal carotid arteries at the skull base without hemodynamically significant stenosis. 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 Patent distal branches. Right MCA is normal. VENOUS SINUSES: As permitted by contrast timing, patent. ANATOMIC VARIANTS: None Review of the MIP images confirms the above findings. IMPRESSION: 1. No emergent large vessel occlusion. 2. 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 3. 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 4. Bilateral carotid calcific atherosclerosis with 50% stenosis of the proximal right ICA. 5. Aortic Atherosclerosis (ICD10-I70.0). Electronically Signed   By: Ulyses Jarred M.D.   On: 07/23/2019 01:26   Ct Angio Neck W Or Wo Contrast  Result Date: 07/23/2019 CLINICAL DATA:  Right-sided paralysis EXAM: CT ANGIOGRAPHY HEAD AND NECK TECHNIQUE: Multidetector CT imaging of the head and neck was performed using the standard protocol during bolus administration of intravenous contrast. Multiplanar CT image reconstructions and MIPs were obtained to evaluate the vascular anatomy. Carotid stenosis measurements (when applicable) are obtained utilizing NASCET criteria, using the distal internal carotid diameter as the denominator. CONTRAST:  57mL OMNIPAQUE IOHEXOL 350 MG/ML SOLN COMPARISON:  Head CT same day FINDINGS: CTA NECK FINDINGS SKELETON: There is no bony spinal canal stenosis. No lytic or blastic lesion. OTHER NECK: Normal pharynx, larynx and major salivary glands. No cervical  lymphadenopathy. Unremarkable thyroid gland. UPPER CHEST: No pneumothorax or pleural effusion. No nodules or masses. AORTIC ARCH: There is moderate calcific atherosclerosis of the aortic arch. There is no aneurysm, dissection or hemodynamically significant stenosis of the visualized portion of the aorta. Conventional 3 vessel aortic branching pattern. 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 RIGHT CAROTID SYSTEM: There is atherosclerotic calcification within the right common carotid artery. There is bulky calcific atherosclerosis at the carotid bifurcation extending into the proximal right ICA resulting in approximately 50% stenosis. LEFT CAROTID SYSTEM: There is atherosclerotic calcification within the common carotid artery and at the carotid bifurcation. There is no flow-limiting stenosis. VERTEBRAL ARTERIES: Left dominant configuration. Both origins are clearly patent. There is multifocal calcification within both vertebral arteries which remain patent to the vertebrobasilar confluence. CTA HEAD FINDINGS POSTERIOR CIRCULATION: --Vertebral arteries: Atherosclerotic calcification within the left vertebral artery without stenosis. The right vertebral artery terminates in PICA. --Posterior inferior cerebellar arteries (PICA): Patent origins from the vertebral arteries. --Anterior inferior cerebellar arteries (AICA): Patent origins from the basilar artery. 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 --Superior cerebellar arteries: Normal. --Posterior cerebral arteries: Normal. Both originate from the basilar artery. Posterior communicating arteries (p-comm) are diminutive or absent. ANTERIOR CIRCULATION: --Intracranial internal carotid arteries: Atherosclerotic calcification of the internal carotid arteries at the skull base without hemodynamically significant stenosis. --Anterior cerebral arteries (ACA): Normal --Middle cerebral arteries (MCA): Moderate-to-severe stenosis  of the left MCA M1 segment, which remains patent. Patent distal branches. Right MCA is normal. VENOUS SINUSES: As permitted by contrast timing, patent. ANATOMIC VARIANTS: None Review of the MIP images confirms the above findings. IMPRESSION: 1. No emergent large vessel occlusion. 2. Moderate-to-severe stenosis of the left MCA  M1 segment, which remains patent. 3. 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 4. Bilateral carotid calcific atherosclerosis with 50% stenosis of the proximal right ICA. 5. Aortic Atherosclerosis (ICD10-I70.0). Electronically Signed   By: Ulyses Jarred M.D.   On: 07/23/2019 01:26   Ct Head Code Stroke Wo Contrast  Result Date: 07/23/2019 CLINICAL DATA:  Code stroke. Right-sided facial paralysis. Last seen normal 6 p.m. EXAM: CT HEAD WITHOUT CONTRAST TECHNIQUE: Contiguous axial images were obtained from the base of the skull through the vertex without intravenous contrast. COMPARISON:  Brain MRI 09/26/2016 FINDINGS: Brain: There is no mass, hemorrhage or extra-axial collection. There is generalized atrophy without lobar predilection. There is hypoattenuation of the periventricular white matter, most commonly indicating chronic ischemic microangiopathy. 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 Vascular: No abnormal hyperdensity of the major intracranial arteries or dural venous sinuses. No intracranial atherosclerosis. Skull: The visualized skull base, calvarium and extracranial soft tissues are normal. Sinuses/Orbits: No fluid levels or advanced mucosal thickening of the visualized paranasal sinuses. No mastoid or middle ear effusion. The orbits are normal. ASPECTS [facility] Stroke Program Early CT Score) - Ganglionic level infarction (caudate, lentiform nuclei, internal capsule, insula, M1-M3 cortex): 7 - Supraganglionic infarction (M4-M6 cortex): 3 Total score (0-10 with 10 being normal): 10 IMPRESSION: 1. No intracranial hemorrhage. 2.  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 3. ASPECTS is 10. 4. These results were communicated to Dr. Karena Addison Aroor at 12:57 am on 07/23/2019 by text page via the [facility] messaging system. Electronically Signed   By: Ulyses Jarred M.D.   On: 07/23/2019 00:57    Procedures Procedures (including critical care time)  Medications Ordered in ED Medications  sodium chloride flush (NS) 0.9 % injection 3 mL (has no administration in time range)  iohexol (OMNIPAQUE) 350 MG/ML injection 80 mL (80 mLs Intravenous Contrast Given 07/23/19 0059)     Initial Impression / Assessment and Plan / ED Course  I have reviewed the triage vital signs and the nursing notes.  Pertinent labs & imaging results that were available during my care of the patient were reviewed by me and considered in my medical decision making (see chart for details).        Patient presents with right-sided weakness.  Last normal before her husband picked her up from dialysis around 6 PM.  She appears to have some right-sided deficits and a contracture of the right upper extremity which is somewhat atypical.  She was cleared for CT.  Per neurology, she is not a candidate for any  intervention.  I do feel she is likely had an acute stroke.  Husband and patient were updated at bedside.  We will plan for admission to the hospitalist.  Neurology consult note to follow.  Final Clinical Impressions(s) / ED Diagnoses   Final diagnoses:  Cerebrovascular accident (CVA), unspecified mechanism ([place])  Right sided weakness    ED Discharge Orders    None       Addalynne Golding, Barbette Hair, MD 07/23/19 [phone]

## 2019-07-23 NOTE — Evaluation (Signed)
Physical Therapy Evaluation Patient Details Name: [name] MRN: [id number] DOB: 1941/04/23 Today's Date: 07/23/2019   History of Present Illness  [name]  is a 78 y.o. female,  w hypertension, hyperlipidemia, Dm2, w neuropathy, ESRD on HD MWF, CAD, Aortic stenosis, h/o stroke apparently presents with c/o right arm and leg numbness, weakness.  MRI positive for Areas of acute infarction throughout the distal left anteriorcerebral artery territory. Mild swelling but no mass effect orhemorrhage.  Clinical Impression  Patient presents with decreased mobility due to deficits listed in PT problem list.  Patient needing mod to max A for mobility and previously ambulated in her home without assistive device with only one fall in past 6 months.  She will benefit from skilled PT in the acute setting and follow up CIR level rehab at d/c.  Spouse eager to be able to take her home.  Planning to measure doorways for wheelchair accessibility.     Follow Up Recommendations CIR;Supervision/Assistance - 24 hour    Equipment Recommendations  Wheelchair (measurements PT);Wheelchair cushion (measurements PT)    Recommendations for Other Services       Precautions / Restrictions Precautions Precautions: Fall      Mobility  Bed Mobility Overal bed mobility: Needs Assistance Bed Mobility: Rolling;Sidelying to Sit;Sit to Supine Rolling: Mod assist Sidelying to sit: Max assist;HOB elevated   Sit to supine: Total assist   General bed mobility comments: cues and increased time to initiate rolling to R, side to sit assist for trunk and legs, to supine assist for all aspects  Transfers Overall transfer level: Needs assistance Equipment used: None Transfers: Stand Pivot Transfers   Stand pivot transfers: Max assist       General transfer comment: to regular chair from stretcher in ED max A for pivot to R, then to higher stretcher max lifting assist from chair and sat on very edge of bed, assist  to scoot and lower to supine  Ambulation/Gait                Stairs            Wheelchair Mobility    Modified Rankin (Stroke Patients Only) Modified Rankin (Stroke Patients Only) Pre-Morbid Rankin Score: Moderate disability Modified Rankin: Severe disability     Balance Overall balance assessment: Needs assistance Sitting-balance support: Feet supported Sitting balance-Leahy Scale: Poor Sitting balance - Comments: EOB without feet supported and with foot of bed up mod A to max A for balance, seated in chair leaning back and on L arm with S.     Standing balance-Leahy Scale: Zero Standing balance comment: max A for standing                             Pertinent Vitals/Pain Pain Assessment: Faces Faces Pain Scale: Hurts little more Pain Location: generalized from all the handling Pain Descriptors / Indicators: Discomfort Pain Intervention(s): Monitored during session;Repositioned    Home Living Family/patient expects to be discharged to:: Private residence Living Arrangements: Spouse/significant other Available Help at Discharge: Family;Available 24 hours/day Type of Home: House Home Access: Level entry     Home Layout: One level Home Equipment: Walker - 4 wheels;Toilet riser      Prior Function Level of Independence: Needs assistance   Gait / Transfers Assistance Needed: no device in the home, rollator to dialysis  ADL's / Homemaking Assistance Needed: sponge bathes with some help from spouse  Hand Dominance   Dominant Hand: Right    Extremity/Trunk Assessment   Upper Extremity Assessment Upper Extremity Assessment: RUE deficits/detail;LUE deficits/detail RUE Deficits / Details: PROM grossly WFL but extensive flexor tone noted, no active movement noted RUE Coordination: decreased gross motor;decreased fine motor LUE Deficits / Details: AROM limited shoulde flexion 90 degrees, strength 3+ to 4-/5    Lower Extremity  Assessment Lower Extremity Assessment: LLE deficits/detail;RLE deficits/detail RLE Deficits / Details: PROM WFL, strength 1/5 hip/knee extension with assisted weight bearing, no antigravity movement noted, no clonus noted, reports sensation intact to light touch compared to L LLE Deficits / Details: AROM WFL, strength 3/5 hip flexion, 4-/5 knee extension 4-/5 ankle DF    Cervical / Trunk Assessment Cervical / Trunk Assessment: Kyphotic  Communication   Communication: No difficulties  Cognition Arousal/Alertness: Awake/alert Behavior During Therapy: WFL for tasks assessed/performed Overall Cognitive Status: Impaired/Different from baseline Area of Impairment: Memory;Following commands;Problem solving                     Memory: Decreased short-term memory Following Commands: Follows one step commands consistently;Follows one step commands with increased time     Problem Solving: Slow processing;Decreased initiation;Requires verbal cues;Requires tactile cues General Comments: reports feels slower with thinking and recalling information than prior to event      General Comments General comments (skin integrity, edema, etc.): spouse in the room and supportive, reports hopeful to take her home, requesting inpatient rehab if able    Exercises     Assessment/Plan    PT Assessment Patient needs continued PT services  PT Problem List Decreased strength;Decreased activity tolerance;Decreased mobility;Decreased safety awareness;Decreased balance;Impaired tone       PT Treatment Interventions DME instruction;Therapeutic activities;Balance training;Neuromuscular re-education;Therapeutic exercise;Functional mobility training;Gait training;Patient/family education    PT Goals (Current goals can be found in the Care Plan section)  Acute Rehab PT Goals Patient Stated Goal: to go to rehab PT Goal Formulation: With patient/family Time For Goal Achievement: 08/06/19 Potential to  Achieve Goals: Fair    Frequency Min 4X/week   Barriers to discharge        Co-evaluation               AM-PAC PT "6 Clicks" Mobility  Outcome Measure Help needed turning from your back to your side while in a flat bed without using bedrails?: A Lot Help needed moving from lying on your back to sitting on the side of a flat bed without using bedrails?: Total Help needed moving to and from a bed to a chair (including a wheelchair)?: Total Help needed standing up from a chair using your arms (e.g., wheelchair or bedside chair)?: Total Help needed to walk in hospital room?: Total Help needed climbing 3-5 steps with a railing? : Total 6 Click Score: 7    End of Session   Activity Tolerance: Patient limited by fatigue Patient left: in bed;with call bell/phone within reach;with family/visitor present Nurse Communication: Mobility status PT Visit Diagnosis: Other abnormalities of gait and mobility (R26.89);Hemiplegia and hemiparesis Hemiplegia - Right/Left: Right Hemiplegia - dominant/non-dominant: Dominant Hemiplegia - caused by: Cerebral infarction    Time: 1010-1040 PT Time Calculation (min) (ACUTE ONLY): 30 min   Charges:   PT Evaluation $PT Eval Moderate Complexity: 1 Mod PT Treatments $Therapeutic Activity: 8-22 mins        Magda Kiel, [place] [facility] [phone] 07/23/2019   Reginia Naas 07/23/2019, 11:11 AM

## 2019-07-24 DIAGNOSIS — G8191 Hemiplegia, unspecified affecting right dominant side: Secondary | ICD-10-CM

## 2019-07-24 DIAGNOSIS — I63522 Cerebral infarction due to unspecified occlusion or stenosis of left anterior cerebral artery: Secondary | ICD-10-CM

## 2019-07-24 LAB — RENAL FUNCTION PANEL
Albumin: 3.1 g/dL — ABNORMAL LOW (ref 3.5–5.0)
Anion gap: 16 — ABNORMAL HIGH (ref 5–15)
BUN: 45 mg/dL — ABNORMAL HIGH (ref 8–23)
CO2: 21 mmol/L — ABNORMAL LOW (ref 22–32)
Calcium: 9.8 mg/dL (ref 8.9–10.3)
Chloride: 96 mmol/L — ABNORMAL LOW (ref 98–111)
Creatinine, Ser: 6.52 mg/dL — ABNORMAL HIGH (ref 0.44–1.00)
GFR calc Af Amer: 7 mL/min — ABNORMAL LOW (ref >60)
GFR calc non Af Amer: 6 mL/min — ABNORMAL LOW (ref >60)
Glucose, Bld: 173 mg/dL — ABNORMAL HIGH (ref 70–99)
Phosphorus: 3.8 mg/dL (ref 2.5–4.6)
Potassium: 5.4 mmol/L — ABNORMAL HIGH (ref 3.5–5.1)
Sodium: 133 mmol/L — ABNORMAL LOW (ref 135–145)

## 2019-07-24 LAB — COMPREHENSIVE METABOLIC PANEL WITH GFR
ALT: 11 U/L (ref 0–44)
AST: 15 U/L (ref 15–41)
Albumin: 1.9 g/dL — ABNORMAL LOW (ref 3.5–5.0)
Alkaline Phosphatase: 52 U/L (ref 38–126)
Anion gap: 6 (ref 5–15)
BUN: 18 mg/dL (ref 8–23)
CO2: 19 mmol/L — ABNORMAL LOW (ref 22–32)
Calcium: 5.7 mg/dL — CL (ref 8.9–10.3)
Chloride: 114 mmol/L — ABNORMAL HIGH (ref 98–111)
Creatinine, Ser: 2.92 mg/dL — ABNORMAL HIGH (ref 0.44–1.00)
GFR calc Af Amer: 17 mL/min — ABNORMAL LOW
GFR calc non Af Amer: 15 mL/min — ABNORMAL LOW
Glucose, Bld: 100 mg/dL — ABNORMAL HIGH (ref 70–99)
Potassium: 2.4 mmol/L — CL (ref 3.5–5.1)
Sodium: 139 mmol/L (ref 135–145)
Total Bilirubin: 0.2 mg/dL — ABNORMAL LOW (ref 0.3–1.2)
Total Protein: 4 g/dL — ABNORMAL LOW (ref 6.5–8.1)

## 2019-07-24 LAB — GLUCOSE, CAPILLARY
Glucose-Capillary: 78 mg/dL (ref 70–99)
Glucose-Capillary: 174 mg/dL — ABNORMAL HIGH (ref 70–99)
Glucose-Capillary: 141 mg/dL — ABNORMAL HIGH (ref 70–99)
Glucose-Capillary: 256 mg/dL — ABNORMAL HIGH (ref 70–99)

## 2019-07-24 LAB — CBC
HCT: 38.4 % (ref 36.0–46.0)
Hemoglobin: 11.8 g/dL — ABNORMAL LOW (ref 12.0–15.0)
MCH: 34.4 pg — ABNORMAL HIGH (ref 26.0–34.0)
MCHC: 30.7 g/dL (ref 30.0–36.0)
MCV: 112 fL — ABNORMAL HIGH (ref 80.0–100.0)
Platelets: 128 10*3/uL — ABNORMAL LOW (ref 150–400)
RBC: 3.43 MIL/uL — ABNORMAL LOW (ref 3.87–5.11)
RDW: 17.6 % — ABNORMAL HIGH (ref 11.5–15.5)
WBC: 10.4 10*3/uL (ref 4.0–10.5)
nRBC: 0.2 % (ref 0.0–0.2)

## 2019-07-24 LAB — HEPATITIS B CORE ANTIBODY, TOTAL: Hep B Core Total Ab: NONREACTIVE

## 2019-07-24 LAB — HEPATITIS B SURFACE ANTIGEN: Hepatitis B Surface Ag: NONREACTIVE

## 2019-07-24 MED ORDER — SODIUM CHLORIDE 0.9 % IV SOLN: 100.0000 mL | INTRAVENOUS | Status: DC | PRN

## 2019-07-24 MED ORDER — LIDOCAINE-PRILOCAINE 2.5-2.5 % EX CREA
1.0000 "application " | TOPICAL_CREAM | CUTANEOUS | Status: DC | PRN
  Filled 2019-07-24: qty 5

## 2019-07-24 MED ORDER — PENTAFLUOROPROP-TETRAFLUOROETH EX AERO: 1.0000 "application " | INHALATION_SPRAY | CUTANEOUS | Status: DC | PRN

## 2019-07-24 MED ORDER — CALCITRIOL 0.5 MCG PO CAPS
ORAL_CAPSULE | ORAL | Status: AC
  Administered 2019-07-24: 0.5 ug
  Filled 2019-07-24: qty 1

## 2019-07-24 MED ORDER — ALTEPLASE 2 MG IJ SOLR: 2.0000 mg | Freq: Once | INTRAMUSCULAR | Status: DC | PRN

## 2019-07-24 MED ORDER — LIDOCAINE HCL (PF) 1 % IJ SOLN: 5.0000 mL | INTRAMUSCULAR | Status: DC | PRN

## 2019-07-24 MED ORDER — HEPARIN SODIUM (PORCINE) 1000 UNIT/ML DIALYSIS
1000.0000 [IU] | INTRAMUSCULAR | Status: DC | PRN
  Administered 2019-07-26: 3800 [IU] via INTRAVENOUS_CENTRAL
  Filled 2019-07-24 (×2): qty 1

## 2019-07-24 NOTE — Progress Notes (Signed)
Occupational Therapy Evaluation Patient Details Name: [name] MRN: [id number] DOB: 02/09/1941 Today's Date: 07/24/2019    History of Present Illness [name]  is a 78 y.o. female,  w hypertension, hyperlipidemia, Dm2, w neuropathy, ESRD on HD MWF, CAD, Aortic stenosis, h/o stroke apparently presents with c/o right arm and leg numbness, weakness.  MRI positive for Areas of acute infarction throughout the distal left anteriorcerebral artery territory. Mild swelling but no mass effect orhemorrhage.   Clinical Impression   PTA, pt was living at home with her husband, and was mostly indpendent with ADL/IADL, her husband assisted her with sponge bathing. Pt reports she was independent with functional mobility without AD. Pt currently requires modA +2 -maxA+2 for bed mobility. She required modA-maxA for support sitting EOB and required maxA+2 for lateral scoot transfer towards the recliner on the left. Pt demonstrated decreased functional use of her RUE impacting her safety and independence with ADL. She also demonstrates a Lgaze preference, but is able to look right with cues. Due to decline in current level of function, pt would benefit from acute OT to address established goals to facilitate safe D/C to venue listed below. At this time, recommend CIR follow-up. Will continue to follow acutely.     Follow Up Recommendations  CIR    Equipment Recommendations  3 in 1 bedside commode;Other (comment)(drop arm)    Recommendations for Other Services       Precautions / Restrictions Precautions Precautions: Fall Restrictions Weight Bearing Restrictions: No      Mobility Bed Mobility Overal bed mobility: Needs Assistance Bed Mobility: Rolling;Sidelying to Sit Rolling: Mod assist;Total assist Sidelying to sit: Max assist;HOB elevated;+2 for safety/equipment       General bed mobility comments: cues and increased time to initiate rolling to R required modA-maxA for roll toward  R;totalA for roll toward Left, side to sit assist for trunk and legs,  Transfers Overall transfer level: Needs assistance Equipment used: None Transfers: Lateral/Scoot Transfers   Stand pivot transfers: Max assist      Lateral/Scoot Transfers: Max assist;+2 physical assistance;+2 safety/equipment General transfer comment: from EOB to drop arm recliner going toward L side;vc and assistance to shift weight forward    Balance Overall balance assessment: Needs assistance Sitting-balance support: Feet supported Sitting balance-Leahy Scale: Zero Sitting balance - Comments: requires maxA to maintain sitting balance EOB;weight shifting while sitting EOB to R, maxA to progress to midline     Standing balance-Leahy Scale: Zero Standing balance comment: deferred                           ADL either performed or assessed with clinical judgement   ADL Overall ADL's : Needs assistance/impaired Eating/Feeding: Moderate assistance;Sitting   Grooming: Moderate assistance;Sitting;Bed level;Maximal assistance Grooming Details (indicate cue type and reason): maxA to support in sitting;able to compensate with use of LUE;assistance for bilateral coordination Upper Body Bathing: Maximal assistance;Sitting   Lower Body Bathing: Maximal assistance;Sitting/lateral leans   Upper Body Dressing : Maximal assistance;Sitting   Lower Body Dressing: Total assistance;Sitting/lateral leans   Toilet Transfer: Maximal assistance;+2 for physical assistance;+2 for safety/equipment Toilet Transfer Details (indicate cue type and reason): rolling R<>L for posterior pericare, pt incontinent of stool;simulated lateral scoot towards L to drop arm recliner Toileting- Clothing Manipulation and Hygiene: Total assistance Toileting - Clothing Manipulation Details (indicate cue type and reason): rolling R<>L for posterior pericare     Functional mobility during ADLs: Maximal assistance;+2 for physical  assistance;+2 for safety/equipment General ADL Comments: sat EOB for about 5-10 min with maxA +2 for safety;weight shifting sitting EOB with tactile and verbal cues for upright posture     Vision Baseline Vision/History: Wears glasses Wears Glasses: Reading only Patient Visual Report: No change from baseline Vision Assessment?: Vision impaired- to be further tested in functional context;Yes Ocular Range of Motion: Within Functional Limits Alignment/Gaze Preference: Chin down;Head turned;Gaze left Saccades: Undershoots;Decreased speed of saccadic movement;Additional eye shifts occurred during testing Additional Comments: pt with L gaze preference, able to turn head and gaze toward R with prompting initially;continue to fully assess     Perception     Praxis      Pertinent Vitals/Pain Pain Assessment: Faces Faces Pain Scale: Hurts little more Pain Location: generalized from all the handling Pain Descriptors / Indicators: Discomfort Pain Intervention(s): Limited activity within patient's tolerance;Monitored during session     Hand Dominance Right   Extremity/Trunk Assessment Upper Extremity Assessment Upper Extremity Assessment: RUE deficits/detail RUE Deficits / Details: brunstrom hand II, arm II; RUE Sensation: WNL RUE Coordination: decreased gross motor;decreased fine motor LUE Deficits / Details: AROM limited shoulde flexion 90 degrees, strength 3+ to 4-/5   Lower Extremity Assessment Lower Extremity Assessment: Defer to PT evaluation   Cervical / Trunk Assessment Cervical / Trunk Assessment: Kyphotic   Communication Communication Communication: No difficulties   Cognition Arousal/Alertness: Awake/alert Behavior During Therapy: WFL for tasks assessed/performed Overall Cognitive Status: Impaired/Different from baseline Area of Impairment: Memory;Following commands;Problem solving                     Memory: Decreased short-term memory Following Commands:  Follows one step commands consistently;Follows one step commands with increased time     Problem Solving: Slow processing;Decreased initiation;Requires verbal cues;Requires tactile cues General Comments: requires increased time for processing information;pt with good recall of reason for admission, "stroke on left side of brain";   General Comments  spouse present during session;pt with noted pressure area on buttocks, RN aware and present to view;educated pt and spouse on importance of setting up and standing on pt's R side to encourage R gaze and head turns;pt reports pain in neck on L side;pt initiated gaze to right as therapists were leaving    Exercises Exercises: Other exercises Other Exercises Other Exercises: Sitting EOB: functional reaching with LUE to promote upright posture and truncal rotation; lateral leans onto R side to promote increased weightbearing through RUE; postural re-education with tactile cueing   Shoulder Instructions      Home Living Family/patient expects to be discharged to:: Private residence Living Arrangements: Spouse/significant other Available Help at Discharge: Family;Available 24 hours/day Type of Home: House Home Access: Level entry     Home Layout: One level     Bathroom Shower/Tub: Teacher, early years/pre: Standard     Home Equipment: Environmental consultant - 4 wheels;Toilet riser          Prior Functioning/Environment Level of Independence: Needs assistance  Gait / Transfers Assistance Needed: no device in the home, rollator to dialysis ADL's / Homemaking Assistance Needed: sponge bathes with some help from spouse            OT Problem List: Decreased strength;Decreased range of motion;Decreased activity tolerance;Impaired balance (sitting and/or standing);Decreased safety awareness;Decreased coordination;Impaired vision/perception;Cardiopulmonary status limiting activity;Decreased knowledge of precautions;Impaired UE functional  use;Pain      OT Treatment/Interventions: Self-care/ADL training;Therapeutic exercise;Energy conservation;DME and/or AE instruction;Neuromuscular education;Therapeutic activities;Cognitive remediation/compensation;Visual/perceptual remediation/compensation;Patient/family education;Balance training  OT Goals(Current goals can be found in the care plan section) Acute Rehab OT Goals Patient Stated Goal: to go to rehab OT Goal Formulation: With patient Time For Goal Achievement: 08/07/19 Potential to Achieve Goals: Good ADL Goals Pt Will Perform Grooming: with min guard assist;sitting;standing Pt Will Perform Lower Body Dressing: with min guard assist;sit to/from stand Pt Will Transfer to Toilet: with min assist;stand pivot transfer Additional ADL Goal #1: Pt will indepenently attend to R side of environment during ADL and functional mobiltiy. Additional ADL Goal #2: Pt will progress to EOB with minguardA in preparation for ADL.  OT Frequency: Min 2X/week   Barriers to D/C: Decreased caregiver support          Co-evaluation PT/OT/SLP Co-Evaluation/Treatment: Yes Reason for Co-Treatment: For patient/therapist safety;To address functional/ADL transfers;Complexity of the patient's impairments (multi-system involvement) PT goals addressed during session: Mobility/safety with mobility OT goals addressed during session: ADL's and self-care      AM-PAC OT "6 Clicks" Daily Activity     Outcome Measure Help from another person eating meals?: A Little Help from another person taking care of personal grooming?: A Little Help from another person toileting, which includes using toliet, bedpan, or urinal?: A Lot Help from another person bathing (including washing, rinsing, drying)?: A Lot Help from another person to put on and taking off regular upper body clothing?: A Lot Help from another person to put on and taking off regular lower body clothing?: Total 6 Click Score: 13   End of Session  Equipment Utilized During Treatment: Gait belt Nurse Communication: Mobility status  Activity Tolerance: Patient tolerated treatment well Patient left: in chair;with call bell/phone within reach;with chair alarm set;with family/visitor present  OT Visit Diagnosis: Unsteadiness on feet (R26.81);Other abnormalities of gait and mobility (R26.89);Muscle weakness (generalized) (M62.81);History of falling (Z91.81);Pain;Other symptoms and signs involving cognitive function Pain - part of body: (generalized)                Time: [id number] OT Time Calculation (min): 30 min Charges:  OT General Charges $OT Visit: 1 Visit OT Evaluation $OT Eval Moderate Complexity: [place] OTR/L Acute Rehabilitation Services Office: [place] 07/24/2019, 11:54 AM

## 2019-07-24 NOTE — Progress Notes (Signed)
Pt has called her husband to come to [facility] at 0600 w/o MD approval, security allowed pt up to floor at 0600. Pt is AOx4. Baltazar Najjar informed and MD approval requested for early visitor.

## 2019-07-24 NOTE — Progress Notes (Signed)
Pt back to room from dialysis. Pt alert and verbally responsive. Pt sitting up in bed and eating dinner. Bed alarm on and call light within reach. Will continue to closely monitor. Delia Heady RN   07/24/19 1704  Vital Signs  BP 127/64  BP Location Left Arm  Patient Position (if appropriate) Lying  BP Method Automatic  Pulse Rate 98  Resp 20  Temp 98.6 F (37 C)  Temp Source Oral  Oxygen Therapy  SpO2 99 %  O2 Device Room Air

## 2019-07-24 NOTE — Progress Notes (Signed)
Pt transported off unit to dialysis for treatment. P. Amo Drina Jobst RN. 

## 2019-07-24 NOTE — Plan of Care (Signed)
  Problem: Education: Goal: Knowledge of General Education information will improve Description: Including pain rating scale, medication(s)/side effects and non-pharmacologic comfort measures Outcome: Progressing   Problem: Health Behavior/Discharge Planning: Goal: Ability to manage health-related needs will improve Outcome: Progressing   Problem: Clinical Measurements: Goal: Ability to maintain clinical measurements within normal limits will improve Outcome: Progressing Goal: Will remain free from infection Outcome: Progressing Goal: Diagnostic test results will improve Outcome: Progressing Goal: Respiratory complications will improve Outcome: Progressing Goal: Cardiovascular complication will be avoided Outcome: Progressing   Problem: Activity: Goal: Risk for activity intolerance will decrease Outcome: Progressing   Problem: Nutrition: Goal: Adequate nutrition will be maintained Outcome: Progressing   Problem: Coping: Goal: Level of anxiety will decrease Outcome: Progressing   Problem: Elimination: Goal: Will not experience complications related to bowel motility Outcome: Progressing Goal: Will not experience complications related to urinary retention Outcome: Progressing   Problem: Pain Managment: Goal: General experience of comfort will improve Outcome: Progressing   Problem: Safety: Goal: Ability to remain free from injury will improve Outcome: Progressing   Problem: Skin Integrity: Goal: Risk for impaired skin integrity will decrease Outcome: Progressing   Problem: Education: Goal: Knowledge of secondary prevention will improve Outcome: Progressing Goal: Knowledge of patient specific risk factors addressed and post discharge goals established will improve Outcome: Progressing Goal: Individualized Educational Video(s) Outcome: Progressing   Problem: Coping: Goal: Will identify appropriate support needs Outcome: Progressing   Problem:  Self-Care: Goal: Verbalization of feelings and concerns over difficulty with self-care will improve Outcome: Progressing Goal: Ability to communicate needs accurately will improve Outcome: Progressing   Ival Bible, BSN, RN

## 2019-07-24 NOTE — Progress Notes (Signed)
Physical Therapy Treatment Patient Details Name: ASHALEE OLIVIA MRN: [id number] DOB: 10/13/1940 Today's Date: 07/24/2019    History of Present Illness Abha Racanelli  is a 78 y.o. female,  w hypertension, hyperlipidemia, Dm2, w neuropathy, ESRD on HD MWF, CAD, Aortic stenosis, h/o stroke apparently presents with c/o right arm and leg numbness, weakness.  MRI positive for Areas of acute infarction throughout the distal left anteriorcerebral artery territory. Mild swelling but no mass effect orhemorrhage.    PT Comments    Pt continues with good motivation and is eager to participate with therapy. Session focused on bed mobility, transfer training, and sitting balance. At this time, pt requiring two person maximal assist for functional mobility; able to transfer to chair via low pivot. Continues with right hemiplegia. Improving attention/gaze to right side with environmental set up and cueing. Overall, demonstrates good activity tolerance during therapy session. Suspect steady progress based on motivation and PLOF. Remains excellent candidate for CIR.     Follow Up Recommendations  CIR;Supervision/Assistance - 24 hour     Equipment Recommendations  Wheelchair (measurements PT);Wheelchair cushion (measurements PT)    Recommendations for Other Services       Precautions / Restrictions Precautions Precautions: Fall Restrictions Weight Bearing Restrictions: No    Mobility  Bed Mobility Overal bed mobility: Needs Assistance Bed Mobility: Rolling;Sidelying to Sit Rolling: Mod assist;Total assist Sidelying to sit: Max assist;HOB elevated;+2 for safety/equipment       General bed mobility comments: cues and increased time to initiate rolling to R required modA-maxA for roll toward R;totalA for roll toward Left, side to sit assist for trunk and legs,  Transfers Overall transfer level: Needs assistance Equipment used: None Transfers: Lateral/Scoot Transfers          Lateral/Scoot  Transfers: Max assist;+2 physical assistance;+2 safety/equipment General transfer comment: from EOB to drop arm recliner going toward L side;vc and assistance to shift weight forward  Ambulation/Gait                 Stairs             Wheelchair Mobility    Modified Rankin (Stroke Patients Only) Modified Rankin (Stroke Patients Only) Pre-Morbid Rankin Score: Moderate disability Modified Rankin: Severe disability     Balance Overall balance assessment: Needs assistance Sitting-balance support: Feet supported Sitting balance-Leahy Scale: Zero Sitting balance - Comments: requires maxA to maintain sitting balance EOB;weight shifting while sitting EOB to R, maxA to progress to midline       Standing balance comment: deferred                            Cognition Arousal/Alertness: Awake/alert Behavior During Therapy: WFL for tasks assessed/performed Overall Cognitive Status: Impaired/Different from baseline Area of Impairment: Memory;Following commands;Problem solving                     Memory: Decreased short-term memory Following Commands: Follows one step commands consistently;Follows one step commands with increased time     Problem Solving: Slow processing;Decreased initiation;Requires verbal cues;Requires tactile cues General Comments: requires increased time for processing information;pt with good recall of reason for admission, "stroke on left side of brain";      Exercises Other Exercises Other Exercises: Sitting EOB: functional reaching with LUE to promote upright posture and truncal rotation; lateral leans onto R side to promote increased weightbearing through RUE; postural re-education with tactile cueing    General Comments General comments (  skin integrity, edema, etc.): spouse present during session;pt with noted pressure area on buttocks, RN aware and present to view;educated pt and spouse on importance of setting up and  standing on pt's R side to encourage R gaze and head turns;pt reports pain in neck on L side;pt initiated gaze to right as therapists were leaving      Pertinent Vitals/Pain Pain Assessment: Faces Faces Pain Scale: Hurts little more Pain Location: generalized from all the handling Pain Descriptors / Indicators: Discomfort Pain Intervention(s): Monitored during session;Repositioned    Home Living Family/patient expects to be discharged to:: Private residence Living Arrangements: Spouse/significant other Available Help at Discharge: Family;Available 24 hours/day Type of Home: House Home Access: Level entry   Home Layout: One level Home Equipment: Walker - 4 wheels;Toilet riser      Prior Function Level of Independence: Needs assistance  Gait / Transfers Assistance Needed: no device in the home, rollator to dialysis ADL's / Homemaking Assistance Needed: sponge bathes with some help from spouse     PT Goals (current goals can now be found in the care plan section) Acute Rehab PT Goals Patient Stated Goal: to go to rehab Potential to Achieve Goals: Fair Progress towards PT goals: Progressing toward goals    Frequency    Min 4X/week      PT Plan Current plan remains appropriate    Co-evaluation PT/OT/SLP Co-Evaluation/Treatment: Yes Reason for Co-Treatment: To address functional/ADL transfers;For patient/therapist safety PT goals addressed during session: Mobility/safety with mobility        AM-PAC PT "6 Clicks" Mobility   Outcome Measure  Help needed turning from your back to your side while in a flat bed without using bedrails?: A Lot Help needed moving from lying on your back to sitting on the side of a flat bed without using bedrails?: Total Help needed moving to and from a bed to a chair (including a wheelchair)?: Total Help needed standing up from a chair using your arms (e.g., wheelchair or bedside chair)?: Total Help needed to walk in hospital room?:  Total Help needed climbing 3-5 steps with a railing? : Total 6 Click Score: 7    End of Session Equipment Utilized During Treatment: Gait belt Activity Tolerance: Patient tolerated treatment well Patient left: in chair;with call bell/phone within reach;with chair alarm set Nurse Communication: Mobility status PT Visit Diagnosis: Other abnormalities of gait and mobility (R26.89);Hemiplegia and hemiparesis Hemiplegia - Right/Left: Right Hemiplegia - dominant/non-dominant: Dominant Hemiplegia - caused by: Cerebral infarction     Time: [id number] PT Time Calculation (min) (ACUTE ONLY): 31 min  Charges:  $Therapeutic Activity: 8-22 mins                     Ellamae Sia, PT, DPT Acute Rehabilitation Services Pager [phone] Office [phone]    Willy Eddy 07/24/2019, 10:11 AM

## 2019-07-24 NOTE — Progress Notes (Signed)
Inpatient Rehab Admissions:  Inpatient Rehab Consult received.  I met with patient in dialysis for rehabilitation assessment and to discuss goals and expectations of an inpatient rehab admission.  She is open to CIR and asked that I speak to her son.  I was able to reach him and they are hopeful for CIR as well and do not want pt to go to SNF.  I will open insurance for prior authorization request for possible admission pending approval and bed availability in the next few days.   Signed: Shann Medal, PT, DPT Admissions Coordinator [phone] 07/24/19  3:53 PM

## 2019-07-24 NOTE — Progress Notes (Addendum)
PROGRESS NOTE  [name] [id number] DOB: [birth year] DOA: 07/23/2019 PCP: [name], MD  HPI/Recap of past 24 hours: She is sitting up in chair, denies pain, she cannot move her right side  Assessment/Plan: Principal Problem:   Stroke [facility]) Active Problems:   Diabetes mellitus type II, non insulin dependent ([place])   Essential hypertension   ESRD (end stage renal disease) on dialysis ([place])  Acute LACA infarct -Initial NIH stroke scale was 9 however patient not a candidate for IV TPA as she was outside the window with the time she arrived.  CT angiogram negative for large vessel occlusion, however positive for significant intracranial and extracranial  atherosclerotic disease. -2D echo pending -LDL 16, A1c 5.3 -Was previously not on any antithrombotic biotic prior to admission due to history of GI bleed, she is now agreeable to aspirin enteric coating 81 mg daily with Plavix for 3 weeks then aspirin 81 mg alone per neurology recommendation -CIR placement  History of GI bleed in March 2018 required PRBC transfusion -He does not desire any GI work-up -Monitor H&H  ESRD on hemodialysis Monday Wednesday Friday, plan per nephrology Anemia of chronic disease   Addendum: H/o NSVT -had7beats of NSVT on tele during dialysis today in the afternoon -Blood pressure is too low for betablocker or ccb -keep on tele  CAD prior CABG, denies chest pain history of chronic combined systolic and diastolic heart failure,: Managed by dialysis For a bicuspid aortic valve with moderate aortic stenosis, per cardiology she is not a candidate for surgery or TAVR due to her comorbidities  History of type 2 diabetes with diabetic neuropathy, diet controlled   DVT Prophylaxis:scd's  Code Status: Full  Family Communication: patient , husband over the phone  Disposition Plan: CIR placement   Consultants:  Neurology  Nephrology  CIR  Procedures:  Dialysis Monday Wednesday  Friday  Antibiotics:  None   Objective: BP 99/64 (BP Location: Left Arm)   Pulse 99   Temp 97.6 F (36.4 C) (Oral)   Resp 16   Wt 54.1 kg   SpO2 100%   BMI 18.13 kg/m   Intake/Output Summary (Last 24 hours) at 07/24/2019 1458 Last data filed at 07/24/2019 0540 Gross per 24 hour  Intake -  Output 425 ml  Net -425 ml   Filed Weights   07/23/19 0100 07/24/19 0542 07/24/19 1245  Weight: 51.9 kg 54 kg 54.1 kg    Exam: Patient is examined daily including today on 07/24/2019, exams remain the same as of yesterday except that has changed    General:  NAD  Cardiovascular: RRR  Respiratory: CTABL  Abdomen: Soft/ND/NT, positive BS  Musculoskeletal: No Edema  Neuro: alert, oriented , dense right hemiplegia, sensation intact  Data Reviewed: Basic Metabolic Panel: Recent Labs  Lab 07/23/19 0038 07/23/19 0051 07/23/19 0420 07/24/19 1250  NA 136 135 139 133*  K 4.5 4.4 2.4* 5.4*  CL 97* 100 114* 96*  CO2 25  --  19* 21*  GLUCOSE 156* 153* 100* 173*  BUN 22 23 18  45*  CREATININE 4.26* 4.20* 2.92* 6.52*  CALCIUM 9.1  --  5.7* 9.8  MG  --   --  1.4*  --   PHOS  --   --   --  3.8   Liver Function Tests: Recent Labs  Lab 07/23/19 0038 07/23/19 0420 07/24/19 1250  AST 28 15  --   ALT 20 11  --   ALKPHOS 98 52  --  BILITOT 0.9 0.2*  --   PROT 7.1 4.0*  --   ALBUMIN 3.2* 1.9* 3.1*   No results for input(s): LIPASE, AMYLASE in the last 168 hours. No results for input(s): AMMONIA in the last 168 hours. CBC: Recent Labs  Lab 07/23/19 0038 07/23/19 0051 07/23/19 0420 07/24/19 0359  WBC 9.9  --  6.9 10.4  NEUTROABS 7.5  --   --   --   HGB 12.6 13.9 7.8* 11.8*  HCT 40.8 41.0 26.0* 38.4  MCV 112.7*  --  115.0* 112.0*  PLT 125*  --  90* 128*   Cardiac Enzymes:   No results for input(s): CKTOTAL, CKMB, CKMBINDEX, TROPONINI in the last 168 hours. BNP (last 3 results) No results for input(s): BNP in the last 8760 hours.  ProBNP (last 3 results) No  results for input(s): PROBNP in the last 8760 hours.  CBG: Recent Labs  Lab 07/23/19 0043 07/24/19 0629 07/24/19 1148  GLUCAP 139* 78 174*    Recent Results (from the past 240 hour(s))  SARS CORONAVIRUS 2 (TAT 6-24 HRS) Nasopharyngeal Nasopharyngeal Swab     Status: None   Collection Time: 07/23/19  2:02 AM   Specimen: Nasopharyngeal Swab  Result Value Ref Range Status   SARS Coronavirus 2 NEGATIVE NEGATIVE Final    Comment: (NOTE) SARS-CoV-2 target nucleic acids are NOT DETECTED. The SARS-CoV-2 RNA is generally detectable in upper and lower respiratory specimens during the acute phase of infection. Negative results do not preclude SARS-CoV-2 infection, do not rule out co-infections with other pathogens, and should not be used as the sole basis for treatment or other patient management decisions. Negative results must be combined with clinical observations, patient history, and epidemiological information. The expected result is Negative. Fact Sheet for Patients: SugarRoll.be Fact Sheet for Healthcare Providers: https://www.woods-mathews.com/ This test is not yet approved or cleared by the [place] FDA and  has been authorized for detection and/or diagnosis of SARS-CoV-2 by FDA under an Emergency Use Authorization (EUA). This EUA will remain  in effect (meaning this test can be used) for the duration of the COVID-19 declaration under Section 56 4(b)(1) of the Act, 21 U.S.C. section 360bbb-3(b)(1), unless the authorization is terminated or revoked sooner. Performed at [facility], [place] [address], [place], [place] [address]      Studies: No results found.  Scheduled Meds: . ALPRAZolam  1 mg Oral QHS  . aspirin EC  81 mg Oral Daily  . calcitRIOL  0.5 mcg Oral Daily  . Chlorhexidine Gluconate Cloth  6 each Topical Q0600  . clopidogrel  75 mg Oral Daily  . folic acid  1 mg Oral Daily  . lubiprostone  24 mcg Oral Q  breakfast  . midodrine  10 mg Oral Q M,W,F-HD  . multivitamin  1 tablet Oral QHS  . rosuvastatin  20 mg Oral q1800  . sevelamer carbonate  1,600 mg Oral TID WC  . sodium chloride flush  3 mL Intravenous Once  . vitamin B-12  500 mcg Oral Daily    Continuous Infusions: . sodium chloride    . sodium chloride       Time spent: 35 mins I have personally reviewed and interpreted on  07/24/2019 daily labs, tele strips, imagings as discussed above under date review session and assessment and plans.  I reviewed all nursing notes, pharmacy notes, consultant notes,  vitals, pertinent old records  I have discussed plan of care as described above with RN , patient  and family on 07/24/2019   Florencia Reasons MD, PhD, FACP  [facility] Pager [phone]. If 7PM-7AM, please contact night-coverage at [email or web address], password [facility] 07/24/2019, 2:58 PM  LOS: 1 day

## 2019-07-24 NOTE — Progress Notes (Signed)
STROKE TEAM PROGRESS NOTE   INTERVAL HISTORY Patient is lying comfortably in bed.  Dr. Letta Pate from rehab is at the bedside.  Patient continues to have dense right hemiplegia without significant improvement.  2D echo is pending.  Vitals:   07/24/19 1245 07/24/19 1300 07/24/19 1330 07/24/19 1400  BP: 140/70 (!) 141/76 122/71 101/66  Pulse: 91 98 100 (!) 101  Resp: 20 17 17 17   Temp: 97.6 F (36.4 C)     TempSrc: Oral     SpO2:      Weight: 54.1 kg       CBC:  Recent Labs  Lab 07/23/19 0038  07/23/19 0420 07/24/19 0359  WBC 9.9  --  6.9 10.4  NEUTROABS 7.5  --   --   --   HGB 12.6   < > 7.8* 11.8*  HCT 40.8   < > 26.0* 38.4  MCV 112.7*  --  115.0* 112.0*  PLT 125*  --  90* 128*   < > = values in this interval not displayed.    Basic Metabolic Panel:  Recent Labs  Lab 07/23/19 0420 07/24/19 1250  NA 139 133*  K 2.4* 5.4*  CL 114* 96*  CO2 19* 21*  GLUCOSE 100* 173*  BUN 18 45*  CREATININE 2.92* 6.52*  CALCIUM 5.7* 9.8  MG 1.4*  --   PHOS  --  3.8   Lipid Panel:     Component Value Date/Time   CHOL 59 07/23/2019 0420   CHOL 110 06/26/2018 0936   TRIG 74 07/23/2019 0420   HDL 28 (L) 07/23/2019 0420   HDL 59 06/26/2018 0936   CHOLHDL 2.1 07/23/2019 0420   VLDL 15 07/23/2019 0420   LDLCALC 16 07/23/2019 0420   LDLCALC 26 06/26/2018 0936   HgbA1c:  Lab Results  Component Value Date   HGBA1C 5.3 07/23/2019   Urine Drug Screen: No results found for: LABOPIA, COCAINSCRNUR, LABBENZ, AMPHETMU, THCU, LABBARB  Alcohol Level No results found for: ETH  IMAGING Ct Angio Head W Or Wo Contrast  Result Date: 07/23/2019 CLINICAL DATA:  Right-sided paralysis EXAM: CT ANGIOGRAPHY HEAD AND NECK TECHNIQUE: Multidetector CT imaging of the head and neck was performed using the standard protocol during bolus administration of intravenous contrast. Multiplanar CT image reconstructions and MIPs were obtained to evaluate the vascular anatomy. Carotid stenosis measurements  (when applicable) are obtained utilizing NASCET criteria, using the distal internal carotid diameter as the denominator. CONTRAST:  25mL OMNIPAQUE IOHEXOL 350 MG/ML SOLN COMPARISON:  Head CT same day FINDINGS: CTA NECK FINDINGS SKELETON: There is no bony spinal canal stenosis. No lytic or blastic lesion. OTHER NECK: Normal pharynx, larynx and major salivary glands. No cervical lymphadenopathy. Unremarkable thyroid gland. UPPER CHEST: No pneumothorax or pleural effusion. No nodules or masses. AORTIC ARCH: There is moderate calcific atherosclerosis of the aortic arch. There is no aneurysm, dissection or hemodynamically significant stenosis of the visualized portion of the aorta. Conventional 3 vessel aortic branching pattern. 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 RIGHT CAROTID SYSTEM: There is atherosclerotic calcification within the right common carotid artery. There is bulky calcific atherosclerosis at the carotid bifurcation extending into the proximal right ICA resulting in approximately 50% stenosis. LEFT CAROTID SYSTEM: There is atherosclerotic calcification within the common carotid artery and at the carotid bifurcation. There is no flow-limiting stenosis. VERTEBRAL ARTERIES: Left dominant configuration. Both origins are clearly patent. There is multifocal calcification within both vertebral arteries which remain patent to the vertebrobasilar confluence. CTA  HEAD FINDINGS POSTERIOR CIRCULATION: --Vertebral arteries: Atherosclerotic calcification within the left vertebral artery without stenosis. The right vertebral artery terminates in PICA. --Posterior inferior cerebellar arteries (PICA): Patent origins from the vertebral arteries. --Anterior inferior cerebellar arteries (AICA): Patent origins from the basilar artery. 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 --Superior cerebellar arteries: Normal. --Posterior cerebral arteries: Normal. Both originate from the  basilar artery. Posterior communicating arteries (p-comm) are diminutive or absent. ANTERIOR CIRCULATION: --Intracranial internal carotid arteries: Atherosclerotic calcification of the internal carotid arteries at the skull base without hemodynamically significant stenosis. 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 Patent distal branches. Right MCA is normal. VENOUS SINUSES: As permitted by contrast timing, patent. ANATOMIC VARIANTS: None Review of the MIP images confirms the above findings. IMPRESSION: 1. No emergent large vessel occlusion. 2. 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 3. 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 4. Bilateral carotid calcific atherosclerosis with 50% stenosis of the proximal right ICA. 5. Aortic Atherosclerosis (ICD10-I70.0). Electronically Signed   By: Ulyses Jarred M.D.   On: 07/23/2019 01:26   Dg Chest 2 View  Result Date: 07/23/2019 CLINICAL DATA:  Facial droop and right-sided paralysis. EXAM: CHEST - 2 VIEW COMPARISON:  Radiograph 05/29/2018 FINDINGS: Right dialysis catheter tip at the atrial caval junction. Post median sternotomy and CABG. Decreased cardiomegaly from prior exam. Aortic atherosclerosis and tortuosity. Bilateral pleural effusions have resolved. Improved interstitial thickening. Mild underlying interstitial coarsening which may be chronic. No focal airspace disease. No pleural fluid or pneumothorax. No acute osseous abnormalities are seen. IMPRESSION: 1. Mild interstitial coarsening, favor this is chronic rather than pulmonary edema. 2. No other acute findings. 3. Dialysis catheter tip at the atrial caval junction. Electronically Signed   By: Keith Rake M.D.   On: 07/23/2019 02:45   Ct Angio Neck W Or Wo Contrast  Result Date: 07/23/2019 CLINICAL DATA:  Right-sided paralysis EXAM: CT  ANGIOGRAPHY HEAD AND NECK TECHNIQUE: Multidetector CT imaging of the head and neck was performed using the standard protocol during bolus administration of intravenous contrast. Multiplanar CT image reconstructions and MIPs were obtained to evaluate the vascular anatomy. Carotid stenosis measurements (when applicable) are obtained utilizing NASCET criteria, using the distal internal carotid diameter as the denominator. CONTRAST:  38mL OMNIPAQUE IOHEXOL 350 MG/ML SOLN COMPARISON:  Head CT same day FINDINGS: CTA NECK FINDINGS SKELETON: There is no bony spinal canal stenosis. No lytic or blastic lesion. OTHER NECK: Normal pharynx, larynx and major salivary glands. No cervical lymphadenopathy. Unremarkable thyroid gland. UPPER CHEST: No pneumothorax or pleural effusion. No nodules or masses. AORTIC ARCH: There is moderate calcific atherosclerosis of the aortic arch. There is no aneurysm, dissection or hemodynamically significant stenosis of the visualized portion of the aorta. Conventional 3 vessel aortic branching pattern. 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 RIGHT CAROTID SYSTEM: There is atherosclerotic calcification within the right common carotid artery. There is bulky calcific atherosclerosis at the carotid bifurcation extending into the proximal right ICA resulting in approximately 50% stenosis. LEFT CAROTID SYSTEM: There is atherosclerotic calcification within the common carotid artery and at the carotid bifurcation. There is no flow-limiting stenosis. VERTEBRAL ARTERIES: Left dominant configuration. Both origins are clearly patent. There is multifocal calcification within both vertebral arteries which remain patent to the vertebrobasilar confluence. CTA HEAD FINDINGS POSTERIOR CIRCULATION: --Vertebral arteries: Atherosclerotic calcification within the left vertebral artery without stenosis.  The right vertebral artery terminates in PICA. --Posterior inferior cerebellar arteries (PICA): Patent  origins from the vertebral arteries. --Anterior inferior cerebellar arteries (AICA): Patent origins from the basilar artery. 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 --Superior cerebellar arteries: Normal. --Posterior cerebral arteries: Normal. Both originate from the basilar artery. Posterior communicating arteries (p-comm) are diminutive or absent. ANTERIOR CIRCULATION: --Intracranial internal carotid arteries: Atherosclerotic calcification of the internal carotid arteries at the skull base without hemodynamically significant stenosis. 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 Patent distal branches. Right MCA is normal. VENOUS SINUSES: As permitted by contrast timing, patent. ANATOMIC VARIANTS: None Review of the MIP images confirms the above findings. IMPRESSION: 1. No emergent large vessel occlusion. 2. 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 3. 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 4. Bilateral carotid calcific atherosclerosis with 50% stenosis of the proximal right ICA. 5. Aortic Atherosclerosis (ICD10-I70.0). Electronically Signed   By: Ulyses Jarred M.D.   On: 07/23/2019 01:26   Mr Brain Wo Contrast  Result Date: 07/23/2019 CLINICAL DATA:  Hypertension. Stroke. Right arm and leg numbness in weakness. EXAM: MRI HEAD WITHOUT CONTRAST TECHNIQUE: Multiplanar, multiecho pulse sequences of the brain and surrounding structures were obtained without intravenous contrast. COMPARISON:  CT studies earlier same day FINDINGS: Brain: Diffusion imaging shows acute infarction in the distal left anterior cerebral artery territory. No evidence of mass effect or hemorrhage. No other vascular territory insult. Elsewhere, there chronic small-vessel ischemic changes throughout the pons. No focal cerebellar insult. Cerebral  hemispheres show chronic small-vessel ischemic changes of the thalami, basal ganglia and hemispheric deep white matter. No hydrocephalus or extra-axial collection. Vascular: Major vessels at the base of the brain show flow. Skull and upper cervical spine: Negative Sinuses/Orbits: Clear/normal Other: None IMPRESSION: Areas of acute infarction throughout the distal left anterior cerebral artery territory. Mild swelling but no mass effect or hemorrhage. Chronic small-vessel ischemic changes elsewhere throughout the brain as outlined above. Electronically Signed   By: Nelson Chimes M.D.   On: 07/23/2019 09:45   Ct Head Code Stroke Wo Contrast  Result Date: 07/23/2019 CLINICAL DATA:  Code stroke. Right-sided facial paralysis. Last seen normal 6 p.m. EXAM: CT HEAD WITHOUT CONTRAST TECHNIQUE: Contiguous axial images were obtained from the base of the skull through the vertex without intravenous contrast. COMPARISON:  Brain MRI 09/26/2016 FINDINGS: Brain: There is no mass, hemorrhage or extra-axial collection. There is generalized atrophy without lobar predilection. There is hypoattenuation of the periventricular white matter, most commonly indicating chronic ischemic microangiopathy. 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 Vascular: No abnormal hyperdensity of the major intracranial arteries or dural venous sinuses. No intracranial atherosclerosis. Skull: The visualized skull base, calvarium and extracranial soft tissues are normal. Sinuses/Orbits: No fluid levels or advanced mucosal thickening of the visualized paranasal sinuses. No mastoid or middle ear effusion. The orbits are normal. ASPECTS [facility] Stroke Program Early CT Score) - Ganglionic level infarction (caudate, lentiform nuclei, internal capsule, insula, M1-M3 cortex): 7 - Supraganglionic infarction (M4-M6 cortex): 3 Total score (0-10 with 10 being normal): 10 IMPRESSION: 1. No intracranial hemorrhage. 2. Chronic  small vessel ischemia and generalized volume loss. 3. ASPECTS is 10. 4. These results were communicated to Dr. Karena Addison Aroor at 12:57 am on 07/23/2019 by text page via the [facility] messaging system. Electronically Signed   By: Lennette Bihari  Collins [place] M.D.   On: 07/23/2019 00:57    PHYSICAL EXAM Pleasant frail elderly Caucasian lady not in distress. . Afebrile. Head is nontraumatic. Neck is supple without bruit.    Cardiac exam no murmur or gallop. Lungs are clear to auscultation. Distal pulses are well felt. Neurological Exam ; Awake alert oriented to time place and person.  No dysarthria aphasia or apraxia.  Extraocular movements are full range without nystagmus.  Blinks to threat bilaterally.  Mild right lower facial weakness.  Tongue midline.  Motor system exam reveals significant right hemiplegia with right upper extremity 1/5 strength in right lower extremity 0/5 strength with only trace withdrawal to painful stimuli.  Normal strength on the left upper and lower extremities.  Tone is reduced on the right.  Slight diminished touch pinprick sensation in the right upper and lower extremities.  Right plantar upgoing left downgoing.  Gait not tested.    ASSESSMENT/PLAN [name] is a 78 y.o. female with history of bilateral carotid stenosis, coronary artery disease, CKD now on dialysis, diabetes mellitus, diastolic congestive heart failure, prior stroke, thrombocytopenia presenting with R sided weakness.    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  Code Stroke CT head No acute abnormality. Small vessel disease. Atrophy. ASPECTS 10.     CTA head & neck no LVO. Moderate to severe L M1 stenosis. Severe stenosis L subclavian artery. B ICA atherosclerosis w/ proximal R ICA 50 stenosis. Aortic atherosclerosis.   MRI  Distal L ACA territory infarct. Small vessel disease.   2D Echo pending   LDL  16  HgbA1c 5.3  SCDs for VTE prophylaxis  No antithrombotic prior to admission, now on aspirin 325 mg daily. Changed aspirin to 81 EC given hx GIB. Will treat with both aspirin and plavix x 3 weeks then continue EC aspirin 81 mg alone. Patient now agreeable (had previously refused aspirin administration)  Therapy recommendations:  CIR  Disposition:  pending   Hypertension  Stable . Permissive hypertension (OK if < 220/120) but gradually normalize in 5-7 days . 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 Hb stable   Anxiety on xanax  ESRD on HD MWF  Constipation  Hospital day # 1  She presented with right hemiplegia secondary to left anterior cerebral artery infarct etiology unclear possibly embolic but she has significant left middle cerebral artery as well as extracranial carotid atherosclerotic disease as well.  She has remote history of right thalamic lacunar infarct in 2018 also from small vessel disease.  Recommend aspirin Plavix for 3 weeks followed by aspirin.  Await echocardiogram results.  Aggressive risk factor modification.  Likely transfer to inpatient rehab over the next few days when bed available and after insurance approval..  Long discussion with patient and Dr. Letta Pate and answered questions.  Greater than 50%  time during this 25-minute visit was spent on counseling and coordination of care about the stroke and discussion with care team.   Discussed with Dr. Florencia Reasons Antony Contras, MD To contact Stroke Continuity provider, please refer to [email or web address] After hours, contact General Neurology

## 2019-07-24 NOTE — Progress Notes (Signed)
Telemetry called RN to inform her of pt having 7beats of vtach at 1446. Pt off unit and in dialysis at time but Dr. Erlinda Hong notified and no new orders received. Delia Heady RN

## 2019-07-24 NOTE — H&P (Signed)
Physical Medicine and Rehabilitation Admission H&P    Chief Complaint  Patient presents with  . Code Stroke  : HPI: [name] is a 78 year old right-handed female with history of hypertension, hyperlipidemia, diabetes mellitus, history of GI bleed, IBS, congestive diastolic heart failure, end-stage renal disease with hemodialysis Monday Wednesday and Fridays, CAD/CABG 2010, aortic stenosis, history of right thalamic CVA 2018, tobacco abuse.  Per chart review patient lives with spouse.  1 level home.  Patient did use a Rollator when going to dialysis.  Presented 07/23/2019 with right-sided weakness.  Cranial CT scan showed no intracranial hemorrhage.  Chronic small vessel ischemia and generalized volume loss noted.  Patient did not receive TPA.  CT angiogram negative for large vessel occlusion however positive for significant intracranial and extracranial atherosclerotic disease.  MRI of the brain areas of acute infarction throughout the distal left anterior cerebral artery territory.  Echocardiogram with ejection fraction of 30% without emboli however it did show catheter at right atrium with oscillating density considering vegetation versus thrombus discussed with neurology services awaiting plan for TEE...  Admission chemistries with creatinine 4.26, SARS coronavirus negative, hemoglobin 12.6, platelet count 125,000.  Neurology follow-up maintained on aspirin and Plavix for CVA prophylaxis x3 weeks then aspirin alone.  Hemodialysis ongoing as per renal services.  Therapy evaluations completed and patient was admitted for a comprehensive rehab program.  Review of Systems  Constitutional: Negative for chills and fever.  HENT: Negative for hearing loss.   Eyes: Negative for blurred vision and double vision.  Respiratory: Negative for cough and shortness of breath.   Cardiovascular: Positive for leg swelling. Negative for chest pain and palpitations.  Gastrointestinal: Positive for  constipation. Negative for heartburn and nausea.       GERD  Genitourinary: Positive for dysuria. Negative for flank pain and hematuria.  Musculoskeletal: Positive for back pain and myalgias.       She did have a fall in the home approximately 6 months ago.  Skin: Negative for rash.  Neurological: Positive for weakness.  Psychiatric/Behavioral: The patient has insomnia.        Anxiety  All other systems reviewed and are negative.  Past Medical History:  Diagnosis Date  . Anemia    Pt is taking iron.   [name] Anxiety   . Arthritis   . Carotid stenosis    40-59% bilateral ICA stenosis in 2/12.  . Chronic low back pain   . CKD (chronic kidney disease)    Dr. Audie Clear at [facility]  . Coronary artery disease    Pt presented 2/10 to [facility] with NSTEMI and diastolic CHF exacerbation.  LHC was done  3/10 showing 99% pRCA stenosis and 80% calcified pLAD stenosis with L=>R collaterals.  Pt was referred  for CABG which was done by Dr. Prescott Gum with LIMA-LAD, SVG-RCA, SVG-OM.  . Diabetes mellitus   . Diabetic neuropathy ([place])   . Diastolic CHF ([facility])    Echo (2/10) showed EF 55-65%, mild LVH, diastolic dysfunction, mild AS with mean gradient 12 mmHg, PASP 43 mmHg.  Echo (2/12): EF 55-60%, mild LVH, mild AS (mean gradient 12), PA systolic pressure 32 mmHg.     [name] GERD (gastroesophageal reflux disease)   . Heart murmur   . Hyperlipidemia   . Hypertension   . Mild aortic stenosis    mean gradient 12 mmHg in 2/12.  . Myocardial infarction ([place])    "mild"  . Pneumonia   . PONV (postoperative nausea and  vomiting)   . Stroke [facility])    " mild"  . Thrombocytopenia ([place])   . Unsteady gait    Past Surgical History:  Procedure Laterality Date  . AORTIC ARCH ANGIOGRAPHY N/A 10/18/2018   Procedure: AORTIC ARCH ANGIOGRAPHY;  Surgeon: Marty Heck, MD;  Location: [place] [facility];  Service: Cardiovascular;  Laterality: N/A;  . AV FISTULA PLACEMENT Left 01/30/2015   Procedure:  Creation of a Radial Cephalic AV Fistula left wrist;  Surgeon: [name], MD;  Location: [place];  Service: Vascular;  Laterality: Left;  . AV FISTULA PLACEMENT Left 09/06/2018   Procedure: LEFT ARM ARTERIOVENOUS (AV) FISTULA CREATION;  Surgeon: Marty Heck, MD;  Location: [place];  Service: Vascular;  Laterality: Left;  . BACK SURGERY     multiple  . BREAST SURGERY     biopsy  . CARDIAC CATHETERIZATION    . CATARACT EXTRACTION W/ INTRAOCULAR LENS  IMPLANT, BILATERAL    . COLONOSCOPY Left 11/03/2016   Procedure: COLONOSCOPY;  Surgeon: [name], MD;  Location: [facility] ENDOSCOPY;  Service: Endoscopy;  Laterality: Left;  . COLONOSCOPY W/ BIOPSIES AND POLYPECTOMY    . CORONARY ARTERY BYPASS GRAFT  09/2008   pt with NSTEMI and diastolic CHF exacerbation.  LHC was done  3/10 showing 99% pRCA stenosis and 80% calcified pLAD stenosis with L=>R collaterals.  Pt was referred  for CABG which was done by Dr. Prescott Gum with LIMA-LAD, SVG-RCA, SVG-OM.  [name] ESOPHAGOGASTRODUODENOSCOPY N/A 11/02/2016   Procedure: ESOPHAGOGASTRODUODENOSCOPY (EGD);  Surgeon: [name], MD;  Location: [facility] ENDOSCOPY;  Service: Endoscopy;  Laterality: N/A;  . GIVENS CAPSULE STUDY N/A 11/29/2016   Procedure: GIVENS CAPSULE STUDY;  Surgeon: [name], MD;  Location: [place];  Service: Endoscopy;  Laterality: N/A;  . LIGATION OF ARTERIOVENOUS  FISTULA Left 09/13/2018   Procedure: LIGATION OF LEFT ARTERIOVENOUS  FISTULA;  Surgeon: Waynetta Sandy, MD;  Location: [place];  Service: Vascular;  Laterality: Left;  . REVISON OF ARTERIOVENOUS FISTULA Left 07/02/2015   Procedure: REVISON OF LEFT RADIOCEPHALIC ARTERIOVENOUS FISTULA;  Surgeon: [name], MD;  Location: [place];  Service: Vascular;  Laterality: Left;  . RIGHT/LEFT HEART CATH AND CORONARY/GRAFT ANGIOGRAPHY N/A 05/18/2018   Procedure: RIGHT/LEFT HEART CATH AND CORONARY/GRAFT ANGIOGRAPHY;  Surgeon: Leonie Man, MD;  Location: [place] [facility];  Service:  Cardiovascular;  Laterality: N/A;  . UPPER EXTREMITY ANGIOGRAPHY Left 10/18/2018   Procedure: UPPER EXTREMITY ANGIOGRAPHY;  Surgeon: Marty Heck, MD;  Location: [place] [facility];  Service: Cardiovascular;  Laterality: Left;   Family History  Adopted: Yes  Family history unknown: Yes   Social History:  reports that she has quit smoking. Her smoking use included cigarettes. She has never used smokeless tobacco. She reports current alcohol use. She reports that she does not use drugs. Allergies:  Allergies  Allergen Reactions  . Doxycycline Nausea And Vomiting    Caused "DEATHLY NAUSEA AND VOMITING"  . Lipitor [Atorvastatin] Other (See Comments)    Stomach pain  . Strawberry Extract Rash   Medications Prior to Admission  Medication Sig Dispense Refill  . acetaminophen (TYLENOL) 500 MG tablet Take 1,000 mg by mouth 3 (three) times daily as needed for moderate pain.    [name] ALPRAZolam (XANAX) 1 MG tablet Take 1 mg by mouth at bedtime.     . AMITIZA 24 MCG capsule Take 24 mcg by mouth 2 (two) times daily as needed for constipation.     [name] b  complex-vitamin c-folic acid (NEPHRO-VITE) 0.8 MG TABS tablet Take 1 tablet by mouth daily.  3  . calcitRIOL (ROCALTROL) 0.5 MCG capsule Take 0.5 mcg by mouth daily.  1  . diphenhydrAMINE (BENADRYL) 25 MG tablet Take 25 mg by mouth daily as needed for allergies.    . folic acid (FOLVITE) 1 MG tablet Take 1 tablet (1 mg total) by mouth daily. 30 tablet 0  . gabapentin (NEURONTIN) 100 MG capsule Take 100 mg by mouth at bedtime.    . midodrine (PROAMATINE) 10 MG tablet Take 10 mg by mouth 3 (three) times a week. Patient takes on dialysis days, M,W,F    . rosuvastatin (CRESTOR) 5 MG tablet TAKE 1 TABLET BY MOUTH EVERYDAY AT BEDTIME (Patient taking differently: Take 5 mg by mouth at bedtime. ) 90 tablet 3  . sevelamer carbonate (RENVELA) 800 MG tablet Take 1,600 mg 3 (three) times daily with meals by mouth.     . vitamin B-12 (CYANOCOBALAMIN) 500 MCG  tablet Take 1 tablet (500 mcg total) by mouth daily. 30 tablet 0    Drug Regimen Review Drug regimen was reviewed and remains appropriate with no significant issues identified  Home: Home Living Family/patient expects to be discharged to:: Private residence Living Arrangements: Spouse/significant other Available Help at Discharge: Family, Available 24 hours/day Type of Home: House Home Access: Level entry Home Layout: One level Bathroom Shower/Tub: Chiropodist: Standard Home Equipment: Environmental consultant - 4 wheels, Toilet riser  Lives With: Spouse   Functional History: Prior Function Level of Independence: Needs assistance Gait / Transfers Assistance Needed: no device in the home, rollator to dialysis ADL's / Homemaking Assistance Needed: sponge bathes with some help from spouse  Functional Status:  Mobility: Bed Mobility Overal bed mobility: Needs Assistance Bed Mobility: Supine to Sit Rolling: Mod assist, Total assist Sidelying to sit: Max assist, HOB elevated, +2 for safety/equipment Supine to sit: Max assist, +2 for physical assistance Sit to supine: Total assist General bed mobility comments: Able to initiate LLE movement, cues for push off through L elbow. MaxA + 2 to transition to edge of bed, use of bed pad to scoot hips forward to edge Transfers Overall transfer level: Needs assistance Equipment used: None Transfers: Lateral/Scoot Transfers, Sit to/from Stand Sit to Stand: Max assist, +2 physical assistance Stand pivot transfers: Max assist  Lateral/Scoot Transfers: Max assist, +2 physical assistance, +2 safety/equipment General transfer comment: MaxA + 2 to stand from edge of bed x 2 with right knee blocked. TotalA for peri care in standing. MaxA + 2 for lateral scoot transfer towards left. Cues for hand placement      ADL: ADL Overall ADL's : Needs assistance/impaired Eating/Feeding: Moderate assistance, Sitting Grooming: Moderate assistance,  Sitting, Bed level, Maximal assistance Grooming Details (indicate cue type and reason): maxA to support in sitting;able to compensate with use of LUE;assistance for bilateral coordination Upper Body Bathing: Maximal assistance, Sitting Lower Body Bathing: Maximal assistance, Sitting/lateral leans Upper Body Dressing : Maximal assistance, Sitting Lower Body Dressing: Total assistance, Sitting/lateral leans Toilet Transfer: Maximal assistance, +2 for physical assistance, +2 for safety/equipment Toilet Transfer Details (indicate cue type and reason): rolling R<>L for posterior pericare, pt incontinent of stool;simulated lateral scoot towards L to drop arm recliner Toileting- Clothing Manipulation and Hygiene: Total assistance Toileting - Clothing Manipulation Details (indicate cue type and reason): rolling R<>L for posterior pericare Functional mobility during ADLs: Maximal assistance, +2 for physical assistance, +2 for safety/equipment General ADL Comments: sat EOB for about 5-10  min with maxA +2 for safety;weight shifting sitting EOB with tactile and verbal cues for upright posture  Cognition: Cognition Overall Cognitive Status: Impaired/Different from baseline Arousal/Alertness: Awake/alert Orientation Level: Oriented X4 Attention: Selective Selective Attention: Appears intact Memory: Appears intact Awareness: Appears intact Problem Solving: Impaired Problem Solving Impairment: Verbal complex, Functional complex Safety/Judgment: Appears intact Comments: very delayed processing Cognition Arousal/Alertness: Awake/alert Behavior During Therapy: WFL for tasks assessed/performed Overall Cognitive Status: Impaired/Different from baseline Area of Impairment: Memory, Following commands, Problem solving Memory: Decreased short-term memory Following Commands: Follows one step commands consistently, Follows one step commands with increased time Problem Solving: Slow processing, Decreased  initiation, Requires verbal cues, Requires tactile cues General Comments: increased time for processing  Physical Exam: Blood pressure (!) 156/67, pulse 89, temperature 98.4 F (36.9 C), temperature source Oral, resp. rate 18, weight 54.4 kg, SpO2 97 %. Physical Exam  Constitutional: She is oriented to person, place, and time. No distress.  Frail appearing  HENT:  Head: Normocephalic and atraumatic.  Eyes: Pupils are equal, round, and reactive to light. EOM are normal.  Neck: No tracheal deviation present. No thyromegaly present.  Cardiovascular: Normal rate and regular rhythm. Exam reveals no friction rub.  No murmur heard. Respiratory: Effort normal and breath sounds normal. No respiratory distress. She has no wheezes.  GI: Soft. She exhibits no distension. There is no abdominal tenderness.  Musculoskeletal:        General: No edema. Normal range of motion.     Cervical back: Normal range of motion.  Neurological: She is alert and oriented to person, place, and time.  Fair insight and awareness. Speech clear. Right central 7. Mild right inattention. No field cut. RUE 0/5 RLE 0/5. LUE and LLE 4- to 4/5. Senses pain and light touch in all 4's. DTR's 1+  Skin: She is not diaphoretic.  A few scattered abrasions RUE>LE.     Results for orders placed or performed during the hospital encounter of 07/23/19 (from the past 48 hour(s))  Glucose, capillary     Status: None   Collection Time: 07/24/19  6:29 AM  Result Value Ref Range   Glucose-Capillary 78 70 - 99 mg/dL  Glucose, capillary     Status: Abnormal   Collection Time: 07/24/19 11:48 AM  Result Value Ref Range   Glucose-Capillary 174 (H) 70 - 99 mg/dL   Comment 1 Notify RN    Comment 2 Document in Chart   Renal function panel     Status: Abnormal   Collection Time: 07/24/19 12:50 PM  Result Value Ref Range   Sodium 133 (L) 135 - 145 mmol/L   Potassium 5.4 (H) 3.5 - 5.1 mmol/L    Comment: DELTA CHECK NOTED NO VISIBLE  HEMOLYSIS    Chloride 96 (L) 98 - 111 mmol/L   CO2 21 (L) 22 - 32 mmol/L   Glucose, Bld 173 (H) 70 - 99 mg/dL   BUN 45 (H) 8 - 23 mg/dL   Creatinine, Ser 6.52 (H) 0.44 - 1.00 mg/dL    Comment: DELTA CHECK NOTED DIALYSIS    Calcium 9.8 8.9 - 10.3 mg/dL    Comment: DELTA CHECK NOTED   Phosphorus 3.8 2.5 - 4.6 mg/dL   Albumin 3.1 (L) 3.5 - 5.0 g/dL   GFR calc non Af Amer 6 (L) >60 mL/min   GFR calc Af Amer 7 (L) >60 mL/min   Anion gap 16 (H) 5 - 15    Comment: Performed at [facility], [address] [place], [place] [address]  Hepatitis B core antibody, total     Status: None   Collection Time: 07/24/19  1:42 PM  Result Value Ref Range   Hep B Core Total Ab NON REACTIVE NON REACTIVE    Comment: Performed at [facility] [address], [place], [place] [address]  Hepatitis B surface antigen     Status: None   Collection Time: 07/24/19  1:42 PM  Result Value Ref Range   Hepatitis B Surface Ag NON REACTIVE NON REACTIVE    Comment: Performed at [facility] [address] [place], [place] [address]  Hepatitis B surface antibody     Status: Abnormal   Collection Time: 07/24/19  1:42 PM  Result Value Ref Range   Hepatitis B-Post 5.5 (L) Immunity>9.9 mIU/mL    Comment: (NOTE) **Verified by repeat analysis**  Status of Immunity                     Anti-HBs Level  ------------------                     -------------- Inconsistent with Immunity                   0.0 - 9.9 Consistent with Immunity                          >9.9 Performed At: [facility] [address] [place], [place] [id number] Rush Farmer MD [id number]   Glucose, capillary     Status: Abnormal   Collection Time: 07/24/19  5:06 PM  Result Value Ref Range   Glucose-Capillary 141 (H) 70 - 99 mg/dL   Comment 1 Notify RN    Comment 2 Document in Chart   Glucose, capillary     Status: Abnormal   Collection Time: 07/24/19  9:12 PM  Result Value Ref Range   Glucose-Capillary 256  (H) 70 - 99 mg/dL  Glucose, capillary     Status: Abnormal   Collection Time: 07/25/19  6:00 AM  Result Value Ref Range   Glucose-Capillary 109 (H) 70 - 99 mg/dL  Glucose, capillary     Status: Abnormal   Collection Time: 07/25/19 12:52 PM  Result Value Ref Range   Glucose-Capillary 272 (H) 70 - 99 mg/dL   Comment 1 Notify RN    Comment 2 Document in Chart   Glucose, capillary     Status: Abnormal   Collection Time: 07/25/19  3:42 PM  Result Value Ref Range   Glucose-Capillary 226 (H) 70 - 99 mg/dL   Comment 1 Notify RN    Comment 2 Document in Chart   Glucose, capillary     Status: Abnormal   Collection Time: 07/25/19  9:15 PM  Result Value Ref Range   Glucose-Capillary 155 (H) 70 - 99 mg/dL  Glucose, capillary     Status: Abnormal   Collection Time: 07/26/19  5:47 AM  Result Value Ref Range   Glucose-Capillary 107 (H) 70 - 99 mg/dL   No results found.     Medical Problem List and Plan: 1.  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  Antithrombotics: -DVT/anticoagulation: SCDs  -antiplatelet therapy: Aspirin 81 mg daily and Plavix 75 mg daily x3 weeks then aspirin alone 3. Pain Management: Tylenol  as needed 4. Mood: Xanax 1 mg nightly  -antipsychotic agents: N/A 5. Neuropsych: This patient is capable of making decisions on her own behalf. 6. Skin/Wound Care: Routine skin checks  -local skin care for abrasions  -nutrition discussed 7. Fluids/Electrolytes/Nutrition: Routine and outs with follow-up chemistries 8.  End-stage renal disease.  Hemodialysis per renal services 9.  Orthostasis.  Continue ProAmatine 10 mg Monday Wednesday Friday 10.  Hyperlipidemia.  Crestor 11.  Chronic diastolic congestive heart failure.  Monitor for any signs of fluid overload. Daily weights 12.  Diet-controlled diabetes mellitus, hemoglobin A1c 5.3.  Blood sugar  checks discontinued 13.  History of tobacco abuse.  Counseling 14.  CAD with CABG 2010.  Continue aspirin.  No chest pain reported. 15.  IBS/history of GI bleed.  Continue Amitiza 24 mcg daily    Cathlyn Parsons, PA-C 07/26/2019

## 2019-07-24 NOTE — Progress Notes (Signed)
pt needing CBG order. Baltazar Najjar ([facility]) informed.

## 2019-07-24 NOTE — Progress Notes (Addendum)
[facility] Progress Note   Subjective:  Seen in room - sitting in chair, visiting with her husband. No CP/dyspnea. Still unable to move R arm or leg, has noticed occ "twitching" in R foot.  Objective Vitals:   07/24/19 0029 07/24/19 0528 07/24/19 0542 07/24/19 0922  BP: 119/67 (!) 153/90  129/69  Pulse: 83 93  89  Resp: 20 20  20   Temp: 98 F (36.7 C) 97.7 F (36.5 C)  98.3 F (36.8 C)  TempSrc: Oral Oral  Oral  SpO2: 95% 97%  95%  Weight:   54 kg    Physical Exam General: Frail woman, NAD. Speech/smile intact Heart: RRR; 3/6 murmur Lungs: CTA anteriorly Abdomen: soft, non-tender Extremities: No LE edema; cannot move R arm or leg Dialysis Access: TDC in R chest, no tenderness  Additional Objective Labs: Basic Metabolic Panel: Recent Labs  Lab 07/23/19 0038 07/23/19 0051 07/23/19 0420  NA 136 135 139  K 4.5 4.4 2.4*  CL 97* 100 114*  CO2 25  --  19*  GLUCOSE 156* 153* 100*  BUN 22 23 18   CREATININE 4.26* 4.20* 2.92*  CALCIUM 9.1  --  5.7*   Liver Function Tests: Recent Labs  Lab 07/23/19 0038 07/23/19 0420  AST 28 15  ALT 20 11  ALKPHOS 98 52  BILITOT 0.9 0.2*  PROT 7.1 4.0*  ALBUMIN 3.2* 1.9*   CBC: Recent Labs  Lab 07/23/19 0038 07/23/19 0051 07/23/19 0420 07/24/19 0359  WBC 9.9  --  6.9 10.4  NEUTROABS 7.5  --   --   --   HGB 12.6 13.9 7.8* 11.8*  HCT 40.8 41.0 26.0* 38.4  MCV 112.7*  --  115.0* 112.0*  PLT 125*  --  90* 128*   CBG: Recent Labs  Lab 07/23/19 0043 07/24/19 0629  GLUCAP 139* 78   Studies/Results: Ct Angio Head W Or Wo Contrast  Result Date: 07/23/2019 CLINICAL DATA:  Right-sided paralysis EXAM: CT ANGIOGRAPHY HEAD AND NECK TECHNIQUE: Multidetector CT imaging of the head and neck was performed using the standard protocol during bolus administration of intravenous contrast. Multiplanar CT image reconstructions and MIPs were obtained to evaluate the vascular anatomy. Carotid stenosis measurements (when  applicable) are obtained utilizing NASCET criteria, using the distal internal carotid diameter as the denominator. CONTRAST:  62mL OMNIPAQUE IOHEXOL 350 MG/ML SOLN COMPARISON:  Head CT same day FINDINGS: CTA NECK FINDINGS SKELETON: There is no bony spinal canal stenosis. No lytic or blastic lesion. OTHER NECK: Normal pharynx, larynx and major salivary glands. No cervical lymphadenopathy. Unremarkable thyroid gland. UPPER CHEST: No pneumothorax or pleural effusion. No nodules or masses. AORTIC ARCH: There is moderate calcific atherosclerosis of the aortic arch. There is no aneurysm, dissection or hemodynamically significant stenosis of the visualized portion of the aorta. Conventional 3 vessel aortic branching pattern. 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 RIGHT CAROTID SYSTEM: There is atherosclerotic calcification within the right common carotid artery. There is bulky calcific atherosclerosis at the carotid bifurcation extending into the proximal right ICA resulting in approximately 50% stenosis. LEFT CAROTID SYSTEM: There is atherosclerotic calcification within the common carotid artery and at the carotid bifurcation. There is no flow-limiting stenosis. VERTEBRAL ARTERIES: Left dominant configuration. Both origins are clearly patent. There is multifocal calcification within both vertebral arteries which remain patent to the vertebrobasilar confluence. CTA HEAD FINDINGS POSTERIOR CIRCULATION: --Vertebral arteries: Atherosclerotic calcification within the left vertebral artery without stenosis. The right vertebral artery terminates in PICA. --Posterior inferior cerebellar  arteries (PICA): Patent origins from the vertebral arteries. --Anterior inferior cerebellar arteries (AICA): Patent origins from the basilar artery. 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 --Superior cerebellar arteries: Normal. --Posterior cerebral arteries: Normal. Both originate from the  basilar artery. Posterior communicating arteries (p-comm) are diminutive or absent. ANTERIOR CIRCULATION: --Intracranial internal carotid arteries: Atherosclerotic calcification of the internal carotid arteries at the skull base without hemodynamically significant stenosis. 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 Patent distal branches. Right MCA is normal. VENOUS SINUSES: As permitted by contrast timing, patent. ANATOMIC VARIANTS: None Review of the MIP images confirms the above findings. IMPRESSION: 1. No emergent large vessel occlusion. 2. 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 3. 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 4. Bilateral carotid calcific atherosclerosis with 50% stenosis of the proximal right ICA. 5. Aortic Atherosclerosis (ICD10-I70.0). Electronically Signed   By: Ulyses Jarred M.D.   On: 07/23/2019 01:26   Dg Chest 2 View  Result Date: 07/23/2019 CLINICAL DATA:  Facial droop and right-sided paralysis. EXAM: CHEST - 2 VIEW COMPARISON:  Radiograph 05/29/2018 FINDINGS: Right dialysis catheter tip at the atrial caval junction. Post median sternotomy and CABG. Decreased cardiomegaly from prior exam. Aortic atherosclerosis and tortuosity. Bilateral pleural effusions have resolved. Improved interstitial thickening. Mild underlying interstitial coarsening which may be chronic. No focal airspace disease. No pleural fluid or pneumothorax. No acute osseous abnormalities are seen. IMPRESSION: 1. Mild interstitial coarsening, favor this is chronic rather than pulmonary edema. 2. No other acute findings. 3. Dialysis catheter tip at the atrial caval junction. Electronically Signed   By: Keith Rake M.D.   On: 07/23/2019 02:45   Ct Angio Neck W Or Wo Contrast  Result Date: 07/23/2019 CLINICAL DATA:  Right-sided paralysis EXAM: CT  ANGIOGRAPHY HEAD AND NECK TECHNIQUE: Multidetector CT imaging of the head and neck was performed using the standard protocol during bolus administration of intravenous contrast. Multiplanar CT image reconstructions and MIPs were obtained to evaluate the vascular anatomy. Carotid stenosis measurements (when applicable) are obtained utilizing NASCET criteria, using the distal internal carotid diameter as the denominator. CONTRAST:  6mL OMNIPAQUE IOHEXOL 350 MG/ML SOLN COMPARISON:  Head CT same day FINDINGS: CTA NECK FINDINGS SKELETON: There is no bony spinal canal stenosis. No lytic or blastic lesion. OTHER NECK: Normal pharynx, larynx and major salivary glands. No cervical lymphadenopathy. Unremarkable thyroid gland. UPPER CHEST: No pneumothorax or pleural effusion. No nodules or masses. AORTIC ARCH: There is moderate calcific atherosclerosis of the aortic arch. There is no aneurysm, dissection or hemodynamically significant stenosis of the visualized portion of the aorta. Conventional 3 vessel aortic branching pattern. 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 RIGHT CAROTID SYSTEM: There is atherosclerotic calcification within the right common carotid artery. There is bulky calcific atherosclerosis at the carotid bifurcation extending into the proximal right ICA resulting in approximately 50% stenosis. LEFT CAROTID SYSTEM: There is atherosclerotic calcification within the common carotid artery and at the carotid bifurcation. There is no flow-limiting stenosis. VERTEBRAL ARTERIES: Left dominant configuration. Both origins are clearly patent. There is multifocal calcification within both vertebral arteries which remain patent to the vertebrobasilar confluence. CTA HEAD FINDINGS POSTERIOR CIRCULATION: --Vertebral arteries: Atherosclerotic calcification within the left vertebral artery without stenosis. The right vertebral artery terminates in PICA. --Posterior inferior cerebellar arteries (PICA): Patent  origins from the vertebral arteries. --Anterior inferior cerebellar arteries (AICA): Patent  origins from the basilar artery. 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 --Superior cerebellar arteries: Normal. --Posterior cerebral arteries: Normal. Both originate from the basilar artery. Posterior communicating arteries (p-comm) are diminutive or absent. ANTERIOR CIRCULATION: --Intracranial internal carotid arteries: Atherosclerotic calcification of the internal carotid arteries at the skull base without hemodynamically significant stenosis. 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 Patent distal branches. Right MCA is normal. VENOUS SINUSES: As permitted by contrast timing, patent. ANATOMIC VARIANTS: None Review of the MIP images confirms the above findings. IMPRESSION: 1. No emergent large vessel occlusion. 2. 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 3. 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 4. Bilateral carotid calcific atherosclerosis with 50% stenosis of the proximal right ICA. 5. Aortic Atherosclerosis (ICD10-I70.0). Electronically Signed   By: Ulyses Jarred M.D.   On: 07/23/2019 01:26   Mr Brain Wo Contrast  Result Date: 07/23/2019 CLINICAL DATA:  Hypertension. Stroke. Right arm and leg numbness in weakness. EXAM: MRI HEAD WITHOUT CONTRAST TECHNIQUE: Multiplanar, multiecho pulse sequences of the brain and surrounding structures were obtained without intravenous contrast. COMPARISON:  CT studies earlier same day FINDINGS: Brain: Diffusion imaging shows acute infarction in the distal left anterior cerebral artery territory. No evidence of mass effect or hemorrhage. No other vascular territory insult. Elsewhere, there chronic small-vessel ischemic changes throughout the pons. No focal cerebellar insult. Cerebral  hemispheres show chronic small-vessel ischemic changes of the thalami, basal ganglia and hemispheric deep white matter. No hydrocephalus or extra-axial collection. Vascular: Major vessels at the base of the brain show flow. Skull and upper cervical spine: Negative Sinuses/Orbits: Clear/normal Other: None IMPRESSION: Areas of acute infarction throughout the distal left anterior cerebral artery territory. Mild swelling but no mass effect or hemorrhage. Chronic small-vessel ischemic changes elsewhere throughout the brain as outlined above. Electronically Signed   By: Nelson Chimes M.D.   On: 07/23/2019 09:45   Ct Head Code Stroke Wo Contrast  Result Date: 07/23/2019 CLINICAL DATA:  Code stroke. Right-sided facial paralysis. Last seen normal 6 p.m. EXAM: CT HEAD WITHOUT CONTRAST TECHNIQUE: Contiguous axial images were obtained from the base of the skull through the vertex without intravenous contrast. COMPARISON:  Brain MRI 09/26/2016 FINDINGS: Brain: There is no mass, hemorrhage or extra-axial collection. There is generalized atrophy without lobar predilection. There is hypoattenuation of the periventricular white matter, most commonly indicating chronic ischemic microangiopathy. 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 Vascular: No abnormal hyperdensity of the major intracranial arteries or dural venous sinuses. No intracranial atherosclerosis. Skull: The visualized skull base, calvarium and extracranial soft tissues are normal. Sinuses/Orbits: No fluid levels or advanced mucosal thickening of the visualized paranasal sinuses. No mastoid or middle ear effusion. The orbits are normal. ASPECTS [facility] Stroke Program Early CT Score) - Ganglionic level infarction (caudate, lentiform nuclei, internal capsule, insula, M1-M3 cortex): 7 - Supraganglionic infarction (M4-M6 cortex): 3 Total score (0-10 with 10 being normal): 10 IMPRESSION: 1. No intracranial hemorrhage. 2. Chronic  small vessel ischemia and generalized volume loss. 3. ASPECTS is 10. 4. These results were communicated to Dr. Karena Addison Aroor at 12:57 am on 07/23/2019 by text page via the [facility] messaging system. Electronically Signed   By: Ulyses Jarred M.D.   On: 07/23/2019 00:57   Medications:  . ALPRAZolam  1 mg Oral QHS  . aspirin EC  81  mg Oral Daily  . calcitRIOL  0.5 mcg Oral Daily  . Chlorhexidine Gluconate Cloth  6 each Topical Q0600  . clopidogrel  75 mg Oral Daily  . folic acid  1 mg Oral Daily  . lubiprostone  24 mcg Oral Q breakfast  . midodrine  10 mg Oral Q M,W,F-HD  . multivitamin  1 tablet Oral QHS  . rosuvastatin  20 mg Oral q1800  . sevelamer carbonate  1,600 mg Oral TID WC  . sodium chloride flush  3 mL Intravenous Once  . vitamin B-12  500 mcg Oral Daily    Dialysis Orders: MWF at [facility] - last full HD 12/7 4hr, 300/600, EDW 51.5kg, 2K/2.25Ca, TDC, UFP #4, heparin 1000 - Mircera 200 q 2 weeks  Assessment/Plan: 1.  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 Work-up ongoing. R hemiparesis, speech intact. 2.  ESRD: Continue HD per MWF sched - HD today. Will avoid intradialytic hypotension. No heparin. 3.  Hypertension/volume: Permissive HTN per neuro. 4.  Anemia: Hgb 11.8 (assuming prior was error). 5.  Metabolic bone disease: Follow labs. 6. T2DM 7.  CAD (Hx CABG)   Veneta Penton, PA-C 07/24/2019, 10:45 AM  [facility] Pager: [phone]  Pt seen, examined and agree w A/P as above.  Kelly Splinter  MD 07/24/2019, 11:30 AM

## 2019-07-25 DIAGNOSIS — L89151 Pressure ulcer of sacral region, stage 1: Secondary | ICD-10-CM

## 2019-07-25 LAB — ECHOCARDIOGRAM COMPLETE: Weight: 1830.7 oz

## 2019-07-25 LAB — GLUCOSE, CAPILLARY
Glucose-Capillary: 109 mg/dL — ABNORMAL HIGH (ref 70–99)
Glucose-Capillary: 272 mg/dL — ABNORMAL HIGH (ref 70–99)
Glucose-Capillary: 226 mg/dL — ABNORMAL HIGH (ref 70–99)
Glucose-Capillary: 155 mg/dL — ABNORMAL HIGH (ref 70–99)

## 2019-07-25 LAB — HEPATITIS B SURFACE ANTIBODY, QUANTITATIVE: Hep B S AB Quant (Post): 5.5 m[IU]/mL — ABNORMAL LOW (ref >9.9)

## 2019-07-25 MED ORDER — INSULIN ASPART 100 UNIT/ML ~~LOC~~ SOLN
0.0000 [IU] | Freq: Three times a day (TID) | SUBCUTANEOUS | Status: DC
  Administered 2019-07-25 – 2019-07-27 (×3): 3 [IU] via SUBCUTANEOUS
  Administered 2019-07-27: 5 [IU] via SUBCUTANEOUS

## 2019-07-25 NOTE — Progress Notes (Signed)
STROKE TEAM PROGRESS NOTE   INTERVAL HISTORY Patient is sitting in bedside chair.  Her family member is at the bedside.  Patient continues to have dense right hemiplegia without significant improvement.  2D echo is yet pending.  Vitals:   07/25/19 0349 07/25/19 0351 07/25/19 0848 07/25/19 1250  BP: 116/63  104/62 120/67  Pulse: 73  80 90  Resp: 18  19 15   Temp: 97.7 F (36.5 C)  98.3 F (36.8 C) 98.7 F (37.1 C)  TempSrc: Oral  Oral Oral  SpO2: 99%   99%  Weight:  54.4 kg      CBC:  Recent Labs  Lab 07/23/19 0038 07/23/19 0420 07/24/19 0359  WBC 9.9 6.9 10.4  NEUTROABS 7.5  --   --   HGB 12.6 7.8* 11.8*  HCT 40.8 26.0* 38.4  MCV 112.7* 115.0* 112.0*  PLT 125* 90* 128*    Basic Metabolic Panel:  Recent Labs  Lab 07/23/19 0420 07/24/19 1250  NA 139 133*  K 2.4* 5.4*  CL 114* 96*  CO2 19* 21*  GLUCOSE 100* 173*  BUN 18 45*  CREATININE 2.92* 6.52*  CALCIUM 5.7* 9.8  MG 1.4*  --   PHOS  --  3.8   Lipid Panel:     Component Value Date/Time   CHOL 59 07/23/2019 0420   CHOL 110 06/26/2018 0936   TRIG 74 07/23/2019 0420   HDL 28 (L) 07/23/2019 0420   HDL 59 06/26/2018 0936   CHOLHDL 2.1 07/23/2019 0420   VLDL 15 07/23/2019 0420   LDLCALC 16 07/23/2019 0420   LDLCALC 26 06/26/2018 0936   HgbA1c:  Lab Results  Component Value Date   HGBA1C 5.3 07/23/2019   Urine Drug Screen: No results found for: LABOPIA, COCAINSCRNUR, LABBENZ, AMPHETMU, THCU, LABBARB  Alcohol Level No results found for: ETH  IMAGING No results found.  PHYSICAL EXAM Pleasant frail elderly Caucasian lady not in distress. . Afebrile. Head is nontraumatic. Neck is supple without bruit.    Cardiac exam no murmur or gallop. Lungs are clear to auscultation. Distal pulses are well felt. Neurological Exam ; Awake alert oriented to time place and person.  No dysarthria aphasia or apraxia.  Extraocular movements are full range without nystagmus.  Blinks to threat bilaterally.  Mild right lower  facial weakness.  Tongue midline.  Motor system exam reveals significant right hemiplegia with right upper extremity 1/5 strength in right lower extremity 0/5 strength with only trace withdrawal to painful stimuli.  Normal strength on the left upper and lower extremities.  Tone is reduced on the right.  Slight diminished touch pinprick sensation in the right upper and lower extremities.  Right plantar upgoing left downgoing.  Gait not tested.    ASSESSMENT/PLAN [name] is a 78 y.o. female with history of bilateral carotid stenosis, coronary artery disease, CKD now on dialysis, diabetes mellitus, diastolic congestive heart failure, prior stroke, thrombocytopenia presenting with R sided weakness.    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  Code Stroke CT head No acute abnormality. Small vessel disease. Atrophy. ASPECTS 10.     CTA head & neck no LVO. Moderate to severe L M1 stenosis. Severe stenosis L subclavian artery. B ICA atherosclerosis w/ proximal R ICA 50 stenosis. Aortic atherosclerosis.   MRI  distal L ACA territory infarct. Small vessel disease.   2D Echo pending   LDL 16  HgbA1c  5.3  SCDs for VTE prophylaxis  No antithrombotic prior to admission, now on aspirin 325 mg daily. Changed aspirin to 81 EC given hx GIB. Will treat with both aspirin and plavix x 3 weeks then continue EC aspirin 81 mg alone. Patient now agreeable (had previously refused aspirin administration)  Therapy recommendations:  CIR  Disposition:  pending   Hypertension  Stable . Permissive hypertension (OK if < 220/120) but gradually normalize in 5-7 days . 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 Hb stable   Anxiety on xanax  ESRD on HD MWF  Constipation  Hospital day # 2  She presented with right hemiplegia secondary to left anterior cerebral artery infarct etiology unclear possibly embolic but she has significant left middle cerebral artery as well as extracranial carotid atherosclerotic disease as well.  She has remote history of right thalamic lacunar infarct in 2018 also from small vessel disease.  Recommend aspirin Plavix for 3 weeks followed by aspirin.  Await echocardiogram results.  Aggressive risk factor modification.  Likely transfer to inpatient rehab   when bed available and after insurance approval..  Long discussion with patient and family member and answered questions.  Stroke team will sign off.  Follow-up as an outpatient with stroke clinic in 6 weeks.  Greater than 50% time during this 25-minute visit was spent on counseling and coordination of care about the stroke and discussion with care team.  Discussed with Dr. Florencia Reasons Antony Contras, MD To contact Stroke Continuity provider, please refer to [email or web address] After hours, contact General Neurology

## 2019-07-25 NOTE — Progress Notes (Addendum)
PROGRESS NOTE  [name] [id number] DOB: Mar 31, 1941 DOA: 07/23/2019 PCP: [name], MD  Brief history: Patient came from HD after an unresponsive episode.  On MRI found to have: Areas of acute infarction throughout the distal left anterior cerebral artery territory.   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  - CT angiogram negative for large vessel occlusion, however positive for significant intracranial and extracranial  atherosclerotic disease. 1. No emergent large vessel occlusion. 2. 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 3. 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 4. Bilateral carotid calcific atherosclerosis with 50% stenosis of the proximal right ICA. 5. Aortic Atherosclerosis (ICD10-I70.0). -2D echo obtained on 12/8, result pending -LDL 16, A1c 5.3 -Was previously not on any antithrombotic biotic prior to admission due to history of GI bleed, she is now agreeable to aspirin enteric coating 81 mg daily with Plavix for 3 weeks then aspirin 81 mg alone per neurology recommendation -CIR placement  History of GI bleed in March 2018 required PRBC transfusion -He does not desire any GI work-up -Monitor H&H  ESRD on hemodialysis Monday Wednesday Friday, plan per nephrology Anemia of chronic disease    H/o NSVT -had7beats of NSVT on  tele during dialysis on 12/9 pm -Blood pressure is too low for betablocker or ccb -keep on tele  CAD prior CABG, denies chest pain history of chronic combined systolic and diastolic heart failure,: Managed by dialysis For a bicuspid aortic valve with moderate aortic stenosis, per cardiology she is not a candidate for surgery or TAVR due to her comorbidities  History of type 2 diabetes with diabetic neuropathy, diet controlled Blood glucose elevated, start insulin sliding scale   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 Carlis Abbott  no surgical intervention needed as long as patient remains asymptomatic F/u with Dr Carlis Abbott as needed   Stage I decubitus ulcer sacral present on presentation, pressure offloading measures  DVT Prophylaxis:scd's  Code Status: Full  Family Communication: patient , husband over the phone on 12/9, I called husband today at 1:20 PM, husband did not answer the phone and not able to leave a message, will attempt later  Disposition Plan: awaiting for CIR placement   Consultants:  Neurology  Nephrology  CIR  Vascular surgery Dr. Carlis Abbott over the phone  Procedures:  Dialysis Monday Wednesday Friday  Antibiotics:  None   Objective: BP 104/62 (BP Location: Left Arm)   Pulse 80   Temp 98.3 F (36.8 C) (Oral)   Resp 19   Wt 54.4 kg   SpO2 99%   BMI 18.24 kg/m   Intake/Output Summary (Last 24 hours) at 07/25/2019 0948 Last data filed at 07/24/2019 1615 Gross per 24 hour  Intake -  Output 715 ml  Net -715 ml   Filed Weights   07/24/19 1245 07/24/19  1615 07/25/19 0351  Weight: 54.1 kg 53.3 kg 54.4 kg    Exam: Patient is examined daily including today on 07/25/2019, exams remain the same as of yesterday except that has changed    General:  Frail, chronically ill appearing but NAD  Cardiovascular: RRR  Respiratory: CTABL  Abdomen: Soft/ND/NT, positive  BS  Musculoskeletal: No Edema  Neuro: alert, oriented , dense right hemiplegia, sensation intact  Data Reviewed: Basic Metabolic Panel: Recent Labs  Lab 07/23/19 0038 07/23/19 0051 07/23/19 0420 07/24/19 1250  NA 136 135 139 133*  K 4.5 4.4 2.4* 5.4*  CL 97* 100 114* 96*  CO2 25  --  19* 21*  GLUCOSE 156* 153* 100* 173*  BUN 22 23 18  45*  CREATININE 4.26* 4.20* 2.92* 6.52*  CALCIUM 9.1  --  5.7* 9.8  MG  --   --  1.4*  --   PHOS  --   --   --  3.8   Liver Function Tests: Recent Labs  Lab 07/23/19 0038 07/23/19 0420 07/24/19 1250  AST 28 15  --   ALT 20 11  --   ALKPHOS 98 52  --   BILITOT 0.9 0.2*  --   PROT 7.1 4.0*  --   ALBUMIN 3.2* 1.9* 3.1*   No results for input(s): LIPASE, AMYLASE in the last 168 hours. No results for input(s): AMMONIA in the last 168 hours. CBC: Recent Labs  Lab 07/23/19 0038 07/23/19 0051 07/23/19 0420 07/24/19 0359  WBC 9.9  --  6.9 10.4  NEUTROABS 7.5  --   --   --   HGB 12.6 13.9 7.8* 11.8*  HCT 40.8 41.0 26.0* 38.4  MCV 112.7*  --  115.0* 112.0*  PLT 125*  --  90* 128*   Cardiac Enzymes:   No results for input(s): CKTOTAL, CKMB, CKMBINDEX, TROPONINI in the last 168 hours. BNP (last 3 results) No results for input(s): BNP in the last 8760 hours.  ProBNP (last 3 results) No results for input(s): PROBNP in the last 8760 hours.  CBG: Recent Labs  Lab 07/24/19 0629 07/24/19 1148 07/24/19 1706 07/24/19 2112 07/25/19 0600  GLUCAP 78 174* 141* 256* 109*    Recent Results (from the past 240 hour(s))  SARS CORONAVIRUS 2 (TAT 6-24 HRS) Nasopharyngeal Nasopharyngeal Swab     Status: None   Collection Time: 07/23/19  2:02 AM   Specimen: Nasopharyngeal Swab  Result Value Ref Range Status   SARS Coronavirus 2 NEGATIVE NEGATIVE Final    Comment: (NOTE) SARS-CoV-2 target nucleic acids are NOT DETECTED. The SARS-CoV-2 RNA is generally detectable in upper and lower respiratory specimens during the acute phase of infection.  Negative results do not preclude SARS-CoV-2 infection, do not rule out co-infections with other pathogens, and should not be used as the sole basis for treatment or other patient management decisions. Negative results must be combined with clinical observations, patient history, and epidemiological information. The expected result is Negative. Fact Sheet for Patients: SugarRoll.be Fact Sheet for Healthcare Providers: https://www.woods-mathews.com/ This test is not yet approved or cleared by the [place] FDA and  has been authorized for detection and/or diagnosis of SARS-CoV-2 by FDA under an Emergency Use Authorization (EUA). This EUA will remain  in effect (meaning this test can be used) for the duration of the COVID-19 declaration under Section 56 4(b)(1) of the Act, 21 U.S.C. section 360bbb-3(b)(1), unless the authorization is terminated or revoked sooner. Performed at [facility], [place] [address],  [place], [place] [address]      Studies: No results found.  Scheduled Meds: . ALPRAZolam  1 mg Oral QHS  . aspirin EC  81 mg Oral Daily  . calcitRIOL  0.5 mcg Oral Daily  . Chlorhexidine Gluconate Cloth  6 each Topical Q0600  . clopidogrel  75 mg Oral Daily  . folic acid  1 mg Oral Daily  . lubiprostone  24 mcg Oral Q breakfast  . midodrine  10 mg Oral Q M,W,F-HD  . multivitamin  1 tablet Oral QHS  . rosuvastatin  20 mg Oral q1800  . sevelamer carbonate  1,600 mg Oral TID WC  . sodium chloride flush  3 mL Intravenous Once  . vitamin B-12  500 mcg Oral Daily    Continuous Infusions: . sodium chloride    . sodium chloride       Time spent: 35 mins I have personally reviewed and interpreted on  07/25/2019 daily labs, tele strips, imagings as discussed above under date review session and assessment and plans.  I reviewed all nursing notes, pharmacy notes, consultant notes,  vitals, pertinent old records  I have  discussed plan of care as described above with RN , patient  on 07/25/2019   Florencia Reasons MD, PhD, FACP  [facility] Pager [phone]. If 7PM-7AM, please contact night-coverage at [email or web address], password [email or web address] 07/25/2019, 9:48 AM  LOS: 2 days

## 2019-07-25 NOTE — Progress Notes (Signed)
Physical Therapy Treatment Patient Details Name: [name] MRN: [id number] DOB: May 20, 1941 Today's Date: 07/25/2019    History of Present Illness [name]  is a 78 y.o. female,  w hypertension, hyperlipidemia, Dm2, w neuropathy, ESRD on HD MWF, CAD, Aortic stenosis, h/o stroke apparently presents with c/o right arm and leg numbness, weakness.  MRI positive for Areas of acute infarction throughout the distal left anteriorcerebral artery territory. Mild swelling but no mass effect orhemorrhage.    PT Comments    Pt progressing steadily towards goals. Session focused on transfer training and PROM of right upper extremity. Pt requiring two person maximal assist to stand from edge of bed with right knee blocked; able to accept weight and no knee buckle in standing. Transferred to chair via low pivot. Education provided re: mental imagery, active assisted movement of RUE with LUE. Pt remains very motivated to participate; continue to recommend CIR for discharge.     Follow Up Recommendations  CIR;Supervision/Assistance - 24 hour     Equipment Recommendations  Wheelchair (measurements PT);Wheelchair cushion (measurements PT)    Recommendations for Other Services       Precautions / Restrictions Precautions Precautions: Fall Restrictions Weight Bearing Restrictions: No    Mobility  Bed Mobility Overal bed mobility: Needs Assistance Bed Mobility: Supine to Sit     Supine to sit: Max assist;+2 for physical assistance     General bed mobility comments: Able to initiate LLE movement, cues for push off through L elbow. MaxA + 2 to transition to edge of bed, use of bed pad to scoot hips forward to edge  Transfers Overall transfer level: Needs assistance Equipment used: None Transfers: Lateral/Scoot Transfers;Sit to/from Stand Sit to Stand: Max assist;+2 physical assistance        Lateral/Scoot Transfers: Max assist;+2 physical assistance;+2 safety/equipment General transfer  comment: MaxA + 2 to stand from edge of bed x 2 with right knee blocked. TotalA for peri care in standing. MaxA + 2 for lateral scoot transfer towards left. Cues for hand placement  Ambulation/Gait                 Stairs             Wheelchair Mobility    Modified Rankin (Stroke Patients Only) Modified Rankin (Stroke Patients Only) Pre-Morbid Rankin Score: Moderate disability Modified Rankin: Severe disability     Balance Overall balance assessment: Needs assistance Sitting-balance support: Feet supported Sitting balance-Leahy Scale: Zero Sitting balance - Comments: requires maxA to maintain sitting balance EOB       Standing balance comment: deferred                            Cognition Arousal/Alertness: Awake/alert Behavior During Therapy: WFL for tasks assessed/performed Overall Cognitive Status: Impaired/Different from baseline Area of Impairment: Memory;Following commands;Problem solving                     Memory: Decreased short-term memory Following Commands: Follows one step commands consistently;Follows one step commands with increased time     Problem Solving: Slow processing;Decreased initiation;Requires verbal cues;Requires tactile cues General Comments: increased time for processing      Exercises Other Exercises Other Exercises: Supine: PROM RUE D1/D2    General Comments        Pertinent Vitals/Pain Pain Assessment: Faces Faces Pain Scale: Hurts little more Pain Location: "whole body" Pain Descriptors / Indicators: Discomfort Pain Intervention(s): Monitored during session  Home Living     Available Help at Discharge: Family;Available 24 hours/day Type of Home: House              Prior Function            PT Goals (current goals can now be found in the care plan section) Acute Rehab PT Goals Patient Stated Goal: to go to rehab Potential to Achieve Goals: Fair Progress towards PT goals:  Progressing toward goals    Frequency    Min 4X/week      PT Plan Current plan remains appropriate    Co-evaluation              AM-PAC PT "6 Clicks" Mobility   Outcome Measure  Help needed turning from your back to your side while in a flat bed without using bedrails?: A Lot Help needed moving from lying on your back to sitting on the side of a flat bed without using bedrails?: Total Help needed moving to and from a bed to a chair (including a wheelchair)?: Total Help needed standing up from a chair using your arms (e.g., wheelchair or bedside chair)?: Total Help needed to walk in hospital room?: Total Help needed climbing 3-5 steps with a railing? : Total 6 Click Score: 7    End of Session Equipment Utilized During Treatment: Gait belt Activity Tolerance: Patient tolerated treatment well Patient left: in chair;with call bell/phone within reach;with chair alarm set Nurse Communication: Mobility status PT Visit Diagnosis: Other abnormalities of gait and mobility (R26.89);Hemiplegia and hemiparesis Hemiplegia - Right/Left: Right Hemiplegia - dominant/non-dominant: Dominant Hemiplegia - caused by: Cerebral infarction     Time: [id number] PT Time Calculation (min) (ACUTE ONLY): 23 min  Charges:  $Therapeutic Activity: 23-37 mins                     Ellamae Sia, PT, DPT Acute Rehabilitation Services Pager [phone] Office [phone]    Willy Eddy 07/25/2019, 2:03 PM

## 2019-07-25 NOTE — Progress Notes (Addendum)
[facility] Progress Note   Subjective:  Seen in room - eating breakfast. No CP/dyspnea. No new concerns - still with R hemiplegia. Being eval for CIR.   Objective Vitals:   07/25/19 0006 07/25/19 0349 07/25/19 0351 07/25/19 0848  BP: 101/63 116/63  104/62  Pulse: 72 73  80  Resp: 18 18  19   Temp: 97.8 F (36.6 C) 97.7 F (36.5 C)  98.3 F (36.8 C)  TempSrc: Oral Oral  Oral  SpO2: 98% 99%    Weight:   54.4 kg    Physical Exam General: Frail woman, NAD. Speech/smile intact Heart: RRR; 3/6 murmur Lungs: CTA anteriorly Abdomen: soft, non-tender Extremities: No LE edema; cannot move R arm or leg Dialysis Access: TDC in R chest, no tenderness  Additional Objective Labs: Basic Metabolic Panel: Recent Labs  Lab 07/23/19 0038 07/23/19 0051 07/23/19 0420 07/24/19 1250  NA 136 135 139 133*  K 4.5 4.4 2.4* 5.4*  CL 97* 100 114* 96*  CO2 25  --  19* 21*  GLUCOSE 156* 153* 100* 173*  BUN 22 23 18  45*  CREATININE 4.26* 4.20* 2.92* 6.52*  CALCIUM 9.1  --  5.7* 9.8  PHOS  --   --   --  3.8   Liver Function Tests: Recent Labs  Lab 07/23/19 0038 07/23/19 0420 07/24/19 1250  AST 28 15  --   ALT 20 11  --   ALKPHOS 98 52  --   BILITOT 0.9 0.2*  --   PROT 7.1 4.0*  --   ALBUMIN 3.2* 1.9* 3.1*   CBC: Recent Labs  Lab 07/23/19 0038 07/23/19 0051 07/23/19 0420 07/24/19 0359  WBC 9.9  --  6.9 10.4  NEUTROABS 7.5  --   --   --   HGB 12.6 13.9 7.8* 11.8*  HCT 40.8 41.0 26.0* 38.4  MCV 112.7*  --  115.0* 112.0*  PLT 125*  --  90* 128*   CBG: Recent Labs  Lab 07/24/19 0629 07/24/19 1148 07/24/19 1706 07/24/19 2112 07/25/19 0600  GLUCAP 78 174* 141* 256* 109*   Medications: . sodium chloride    . sodium chloride     . ALPRAZolam  1 mg Oral QHS  . aspirin EC  81 mg Oral Daily  . calcitRIOL  0.5 mcg Oral Daily  . Chlorhexidine Gluconate Cloth  6 each Topical Q0600  . clopidogrel  75 mg Oral Daily  . folic acid  1 mg Oral Daily  .  lubiprostone  24 mcg Oral Q breakfast  . midodrine  10 mg Oral Q M,W,F-HD  . multivitamin  1 tablet Oral QHS  . rosuvastatin  20 mg Oral q1800  . sevelamer carbonate  1,600 mg Oral TID WC  . sodium chloride flush  3 mL Intravenous Once  . vitamin B-12  500 mcg Oral Daily    Dialysis Orders: MWF at [facility] 4hr, 300/600, EDW 51.5kg, 2K/2.25Ca, TDC, UFP #4, heparin 1000 - Mircera 200 q 2 weeks  Assessment/Plan: 1. Acute CVA: L ACA distribution, presumed embolic. Echo pending. Persistent R hemiparesis, speech intact. Statin started. Being eval for CIR. 2. ESRD:Continue HD per MWF sched - HD 12/11. Aiming to avoid intradialytic hypotension. No heparin. 3. Hypertension/volume:Permissive HTN per neuro. 4. Anemia:Hgb 11.8 (assuming prior was error). 5. Metabolic bone disease:Ca/Phos to goal. Continue home Sevelamer as binder. 6. T2DM 7. CAD (Hx CABG)  Veneta Penton, PA-C 07/25/2019, 11:06 AM  [facility] Pager: [phone]  Pt seen, examined and  agree w A/P as above.  Kelly Splinter  MD 07/25/2019, 3:16 PM

## 2019-07-25 NOTE — Evaluation (Signed)
Speech Language Pathology Evaluation Patient Details Name: [name] MRN: [id number] DOB: [birth year] Today's Date: 07/25/2019 Time: 1027-1050 SLP Time Calculation (min) (ACUTE ONLY): 23 min  Problem List:  Patient Active Problem List   Diagnosis Date Noted  . Stage I pressure ulcer of sacral region 07/25/2019  . TIA (transient ischemic attack) 07/23/2019  . CVA (cerebral vascular accident) ([place]) 07/23/2019  . Volume overload 05/27/2018  . Dyspnea 05/27/2018  . Acute respiratory failure with hypoxia ([place]) 05/27/2018  . Acute respiratory distress 05/27/2018  . Acute encephalopathy 05/27/2018  . Aspiration pneumonia ([place]) 05/27/2018  . Pressure ulcer 05/19/2018  . Acute on chronic combined systolic and diastolic heart failure ([place]) 05/17/2018  . LBBB (left bundle branch block) 05/17/2018  . Aortic stenosis, moderate 05/17/2018  . NSTEMI (non-ST elevated myocardial infarction) ([place]) 05/16/2018  . Symptomatic anemia 05/15/2018  . Demand ischemia ([place]) 05/15/2018  . Protein-calorie malnutrition, severe 04/20/2018  . Pressure injury of skin 04/18/2018  . GI bleed 10/31/2016  . Blood loss anemia 10/31/2016  . AKI (acute kidney injury) ([place]) 10/31/2016  . Stroke ([place]) 09/26/2016  . ESRD (end stage renal disease) on dialysis ([place]) 06/23/2015  . Balance problem 12/22/2013  . Edema 12/28/2011  . CLAUDICATION 11/01/2010  . CAROTID ARTERY STENOSIS 10/29/2009  . AORTIC STENOSIS 09/17/2009  . Essential hypertension 11/17/2008  . DIASTOLIC HEART FAILURE, CHRONIC 11/17/2008  . Diabetes mellitus type II, non insulin dependent ([place]) 11/12/2008  . Other and unspecified hyperlipidemia 11/12/2008  . Anemia, chronic renal failure 11/12/2008  . Thrombocytopenia ([place]) 11/12/2008  . Anxiety state 11/12/2008  . Coronary atherosclerosis 11/12/2008  . UNSPEC COMBINED SYSTOLIC&DIASTOLIC HEART FAILURE 0000000  . ESOPHAGEAL REFLUX 11/12/2008  . Chronic kidney disease (CKD), stage V ([place])  11/12/2008  . LOW BACK PAIN, CHRONIC 11/12/2008  . INSOMNIA UNSPECIFIED 11/12/2008   Past Medical History:  Past Medical History:  Diagnosis Date  . Anemia    Pt is taking iron.   [name] Anxiety   . Arthritis   . Carotid stenosis    40-59% bilateral ICA stenosis in 2/12.  . Chronic low back pain   . CKD (chronic kidney disease)    Dr. Audie Clear at [facility]phrology  . Coronary artery disease    Pt presented 2/10 to [facility] with NSTEMI and diastolic CHF exacerbation.  LHC was done  3/10 showing 99% pRCA stenosis and 80% calcified pLAD stenosis with L=>R collaterals.  Pt was referred  for CABG which was done by Dr. Prescott Gum with LIMA-LAD, SVG-RCA, SVG-OM.  . Diabetes mellitus   . Diabetic neuropathy ([place])   . Diastolic CHF ([facility])    Echo (2/10) showed EF 55-65%, mild LVH, diastolic dysfunction, mild AS with mean gradient 12 mmHg, PASP 43 mmHg.  Echo (2/12): EF 55-60%, mild LVH, mild AS (mean gradient 12), PA systolic pressure 32 mmHg.     [name] GERD (gastroesophageal reflux disease)   . Heart murmur   . Hyperlipidemia   . Hypertension   . Mild aortic stenosis    mean gradient 12 mmHg in 2/12.  . Myocardial infarction ([place])    "mild"  . Pneumonia   . PONV (postoperative nausea and vomiting)   . Stroke [facility])    " mild"  . Thrombocytopenia ([place])   . Unsteady gait    Past Surgical History:  Past Surgical History:  Procedure Laterality Date  . AORTIC ARCH ANGIOGRAPHY N/A 10/18/2018   Procedure: AORTIC ARCH ANGIOGRAPHY;  Surgeon: Marty Heck, MD;  Location: [facility]  INVASIVE CV LAB;  Service: Cardiovascular;  Laterality: N/A;  . AV FISTULA PLACEMENT Left 01/30/2015   Procedure: Creation of a Radial Cephalic AV Fistula left wrist;  Surgeon: [name], MD;  Location: [place];  Service: Vascular;  Laterality: Left;  . AV FISTULA PLACEMENT Left 09/06/2018   Procedure: LEFT ARM ARTERIOVENOUS (AV) FISTULA CREATION;  Surgeon: Marty Heck, MD;  Location: [place];  Service: Vascular;   Laterality: Left;  . BACK SURGERY     multiple  . BREAST SURGERY     biopsy  . CARDIAC CATHETERIZATION    . CATARACT EXTRACTION W/ INTRAOCULAR LENS  IMPLANT, BILATERAL    . COLONOSCOPY Left 11/03/2016   Procedure: COLONOSCOPY;  Surgeon: [name], MD;  Location: [facility] ENDOSCOPY;  Service: Endoscopy;  Laterality: Left;  . COLONOSCOPY W/ BIOPSIES AND POLYPECTOMY    . CORONARY ARTERY BYPASS GRAFT  09/2008   pt with NSTEMI and diastolic CHF exacerbation.  LHC was done  3/10 showing 99% pRCA stenosis and 80% calcified pLAD stenosis with L=>R collaterals.  Pt was referred  for CABG which was done by Dr. Prescott Gum with LIMA-LAD, SVG-RCA, SVG-OM.  [name] ESOPHAGOGASTRODUODENOSCOPY N/A 11/02/2016   Procedure: ESOPHAGOGASTRODUODENOSCOPY (EGD);  Surgeon: [name], MD;  Location[facility] ENDOSCOPY;  Service: Endoscopy;  Laterality: N/A;  . GIVENS CAPSULE STUDY N/A 11/29/2016   Procedure: GIVENS CAPSULE STUDY;  Surgeon: [name], MD;  Location: [place];  Service: Endoscopy;  Laterality: N/A;  . LIGATION OF ARTERIOVENOUS  FISTULA Left 09/13/2018   Procedure: LIGATION OF LEFT ARTERIOVENOUS  FISTULA;  Surgeon: Waynetta Sandy, MD;  Location: [place];  Service: Vascular;  Laterality: Left;  . REVISON OF ARTERIOVENOUS FISTULA Left 07/02/2015   Procedure: REVISON OF LEFT RADIOCEPHALIC ARTERIOVENOUS FISTULA;  Surgeon: [name], MD;  Location: [place];  Service: Vascular;  Laterality: Left;  . RIGHT/LEFT HEART CATH AND CORONARY/GRAFT ANGIOGRAPHY N/A 05/18/2018   Procedure: RIGHT/LEFT HEART CATH AND CORONARY/GRAFT ANGIOGRAPHY;  Surgeon: Leonie Man, MD;  Location: [place] CV LAB;  Service: Cardiovascular;  Laterality: N/A;  . UPPER EXTREMITY ANGIOGRAPHY Left 10/18/2018   Procedure: UPPER EXTREMITY ANGIOGRAPHY;  Surgeon: Marty Heck, MD;  Location: [place] CV LAB;  Service: Cardiovascular;  Laterality: Left;   HPI:  [name]  is a 78 y.o. female,  w hypertension, hyperlipidemia, Dm2,  w neuropathy, ESRD on HD MWF, CAD, Aortic stenosis, h/o stroke apparently presents with c/o right arm and leg numbness, weakness.  MRI revealed areas of acute infarction throughout the distal left anterior cerebral artery territory, mild swelling but no mass effect or hemorrhage and chronic small-vessel ischemic changes throughout the pons, cerebral hemispheres show chronic small-vessel ischemic changes of the thalami, basal ganglia and hemispheric deep white matter. Chest x-ray reveals mild interstitial coarsening, favor this is chronic rather than pulmonary edema, no other acute findings and dialysis catheter tip at the atrial caval junction.    Assessment / Plan / Recommendation Clinical Impression  Pt with history of dysphagia therapy (05/2018). She was discharged on regular diet with thin liquids (chin tuck needed for safe consumption) d/t silent penetration of thin liquids.  Pt and husband report no "issues with swallowing" after discharge and no history of any respiratory compromise.   Pt presents with mild to moderate cognitive deficits that are c/b substantially delayed processing abilities. At baseline, pt was independent and safely managed her household finances, her medication and performed household tasks. She didn't drive, her husband took her to  HD. While pt is oriented x 4, able to demonstrate selective attention to tasks, and demonstrates great immediate and delayed recall of information, her response times are lengthy and she requires more than a reasonable time to complete problem solving tasks. Pt obtrained a score of 15 out of 22 on MOCA Blind (n=> 18, administered d/t RUE weakness) with points lost d/t inability to list words within 1 minute time frame and inability to perform mental math in timely manner. Pt with 2 instances of word finding deficits - pt confused the words right/left when describing physical deficits. As such, further assessment was provided targeting pt's word finding  abilities. Despite the afore mentioned instances, pt was able to perform generative, responsive, confrontational, divergent and convergent naming tasks without incident as well as communicate abstract complex thoughts wihtout any evidence of word finding deficits. Recommend skilled ST to target cognitive deficits in processing and higher level cognitive function to increase functional independence and reduce caregiver burden. At this time, recommend CIR for follow up ST services.     SLP Assessment  SLP Recommendation/Assessment: All further Speech Lanaguage Pathology  needs can be addressed in the next venue of care SLP Visit Diagnosis: Cognitive communication deficit (R41.841)    Follow Up Recommendations  Inpatient Rehab    Frequency and Duration   N/A        SLP Evaluation Cognition  Overall Cognitive Status: Impaired/Different from baseline Arousal/Alertness: Awake/alert Orientation Level: Oriented X4 Attention: Selective Selective Attention: Appears intact Memory: Appears intact Awareness: Appears intact Problem Solving: Impaired Problem Solving Impairment: Verbal complex;Functional complex Safety/Judgment: Appears intact Comments: very delayed processing       Comprehension  Auditory Comprehension Overall Auditory Comprehension: Appears within functional limits for tasks assessed Visual Recognition/Discrimination Discrimination: Within Function Limits Reading Comprehension Reading Status: Within funtional limits    Expression Expression Primary Mode of Expression: Verbal Verbal Expression Overall Verbal Expression: Appears within functional limits for tasks assessed Written Expression Dominant Hand: Right Written Expression: Unable to assess (comment)   Oral / Motor  Oral Motor/Sensory Function Overall Oral Motor/Sensory Function: Within functional limits Motor Speech Overall Motor Speech: Appears within functional limits for tasks assessed Respiration: Within  functional limits Phonation: Normal Resonance: Within functional limits Articulation: Within functional limitis Intelligibility: Intelligible Motor Planning: Witnin functional limits Motor Speech Errors: Not applicable   GO                    Brieana Shimmin 07/25/2019, 1:58 PM

## 2019-07-26 LAB — RENAL FUNCTION PANEL
Albumin: 2.7 g/dL — ABNORMAL LOW (ref 3.5–5.0)
Albumin: 2.8 g/dL — ABNORMAL LOW (ref 3.5–5.0)
Anion gap: 13 (ref 5–15)
Anion gap: 14 (ref 5–15)
BUN: 58 mg/dL — ABNORMAL HIGH (ref 8–23)
BUN: 59 mg/dL — ABNORMAL HIGH (ref 8–23)
CO2: 23 mmol/L (ref 22–32)
CO2: 25 mmol/L (ref 22–32)
Calcium: 9.1 mg/dL (ref 8.9–10.3)
Calcium: 9.4 mg/dL (ref 8.9–10.3)
Chloride: 94 mmol/L — ABNORMAL LOW (ref 98–111)
Chloride: 94 mmol/L — ABNORMAL LOW (ref 98–111)
Creatinine, Ser: 6.86 mg/dL — ABNORMAL HIGH (ref 0.44–1.00)
Creatinine, Ser: 6.68 mg/dL — ABNORMAL HIGH (ref 0.44–1.00)
GFR calc Af Amer: 6 mL/min — ABNORMAL LOW (ref >60)
GFR calc Af Amer: 6 mL/min — ABNORMAL LOW (ref >60)
GFR calc non Af Amer: 5 mL/min — ABNORMAL LOW (ref >60)
GFR calc non Af Amer: 5 mL/min — ABNORMAL LOW (ref >60)
Glucose, Bld: 242 mg/dL — ABNORMAL HIGH (ref 70–99)
Glucose, Bld: 192 mg/dL — ABNORMAL HIGH (ref 70–99)
Phosphorus: 2.5 mg/dL (ref 2.5–4.6)
Phosphorus: 2.4 mg/dL — ABNORMAL LOW (ref 2.5–4.6)
Potassium: 4.7 mmol/L (ref 3.5–5.1)
Potassium: 4.7 mmol/L (ref 3.5–5.1)
Sodium: 130 mmol/L — ABNORMAL LOW (ref 135–145)
Sodium: 133 mmol/L — ABNORMAL LOW (ref 135–145)

## 2019-07-26 LAB — CBC
HCT: 36.4 % (ref 36.0–46.0)
HCT: 36.5 % (ref 36.0–46.0)
Hemoglobin: 11.7 g/dL — ABNORMAL LOW (ref 12.0–15.0)
Hemoglobin: 11.7 g/dL — ABNORMAL LOW (ref 12.0–15.0)
MCH: 34.8 pg — ABNORMAL HIGH (ref 26.0–34.0)
MCH: 34.9 pg — ABNORMAL HIGH (ref 26.0–34.0)
MCHC: 32.1 g/dL (ref 30.0–36.0)
MCHC: 32.1 g/dL (ref 30.0–36.0)
MCV: 108.3 fL — ABNORMAL HIGH (ref 80.0–100.0)
MCV: 109 fL — ABNORMAL HIGH (ref 80.0–100.0)
Platelets: 112 10*3/uL — ABNORMAL LOW (ref 150–400)
Platelets: 120 10*3/uL — ABNORMAL LOW (ref 150–400)
RBC: 3.36 MIL/uL — ABNORMAL LOW (ref 3.87–5.11)
RBC: 3.35 MIL/uL — ABNORMAL LOW (ref 3.87–5.11)
RDW: 17.6 % — ABNORMAL HIGH (ref 11.5–15.5)
RDW: 17.5 % — ABNORMAL HIGH (ref 11.5–15.5)
WBC: 10.4 10*3/uL (ref 4.0–10.5)
WBC: 10.8 10*3/uL — ABNORMAL HIGH (ref 4.0–10.5)
nRBC: 0 % (ref 0.0–0.2)
nRBC: 0 % (ref 0.0–0.2)

## 2019-07-26 LAB — GLUCOSE, CAPILLARY
Glucose-Capillary: 107 mg/dL — ABNORMAL HIGH (ref 70–99)
Glucose-Capillary: 216 mg/dL — ABNORMAL HIGH (ref 70–99)
Glucose-Capillary: 106 mg/dL — ABNORMAL HIGH (ref 70–99)
Glucose-Capillary: 145 mg/dL — ABNORMAL HIGH (ref 70–99)

## 2019-07-26 MED ORDER — CLOPIDOGREL BISULFATE 75 MG PO TABS: 75.0000 mg | ORAL_TABLET | Freq: Every day | ORAL | 0 refills | Status: DC

## 2019-07-26 MED ORDER — ASPIRIN 81 MG PO TBEC: 81.0000 mg | DELAYED_RELEASE_TABLET | Freq: Every day | ORAL | 3 refills | Status: AC

## 2019-07-26 MED ORDER — INSULIN ASPART 100 UNIT/ML ~~LOC~~ SOLN: 0.0000 [IU] | Freq: Three times a day (TID) | SUBCUTANEOUS | 0 refills | Status: DC

## 2019-07-26 MED ORDER — HEPARIN SODIUM (PORCINE) 1000 UNIT/ML IJ SOLN
INTRAMUSCULAR | Status: AC
  Filled 2019-07-26: qty 4

## 2019-07-26 NOTE — Care Management Important Message (Signed)
Important Message  Patient Details  Name: [name] MRN: [id number] Date of Birth: 11-15-1940   Medicare Important Message Given:  Yes     [name] 07/26/2019, 2:15 PM

## 2019-07-26 NOTE — Progress Notes (Signed)
Inpatient Rehab Admissions Coordinator:   I have insurance authorization for pt to admit to CIR, but have no beds available for her to admit today.  Plan for admission to CIR tomorrow, Saturday 12/12 with approval from Dr. Erlinda Hong.  Dr Naaman Plummer, rehab MD, will follow up with pt in the morning to confirm admission.  Pt's acute care nurse can call for report at [phone] after 12pm on Saturday.    Shann Medal, PT, DPT Admissions Coordinator [phone] 07/26/19  12:37 PM

## 2019-07-26 NOTE — Progress Notes (Signed)
    [facility] has been requested to perform a transesophageal echocardiogram on 07/31/2019 for stroke.  After careful review of history and examination, the risks and benefits of transesophageal echocardiogram have been explained including risks of esophageal damage, perforation (1:10,000 risk), bleeding, pharyngeal hematoma as well as other potential complications associated with conscious sedation including aspiration, arrhythmia, respiratory failure and death. Alternatives to treatment were discussed, questions were answered. Patient is willing to proceed.   TEE scheduled for 07/31/2019 at 3pm with Dr. Harrell Gave.  Roby Lofts, PA-C 07/26/2019 5:19 PM

## 2019-07-26 NOTE — PMR Pre-admission (Signed)
PMR Admission Coordinator Pre-Admission Assessment  Patient: Jeanne [name] is an 78 y.o., female MRN: [id number] DOB: 1940/10/15 Height:   Weight: 54.4 kg              Insurance Information HMO:     PPO: yes     PCP:      IPA:      80/20:      OTHER:  PRIMARY: [employer] Medicare      Policy#: [id number]      Subscriber: pt Jeanne [name]: Jeanne [name]      Phone#: [phone]     Fax#: [phone] Pre-Cert#: [id number]      Employer:  Benefits:  Phone #: [phone]     [name]:  Eff. Date:  08/15/14     Deduct: $0      Out of Pocket Max: $5900 (met for 2020)      Life Max: n/a CIR: $375/day with max copay of $1500      SNF: 20 full days Outpatient:      Co-Pay: $35/visit Home Health: 100%      Co-Pay:  DME: 80%     Co-Pay: 20% Providers: preferred network  SECONDARY:       Policy#:       Subscriber:  Jeanne [name]:       Phone#:      Fax#:  Pre-Cert#:       Employer:  Benefits:  Phone #:      [name]:  Eff. Date:      Deduct:       Out of Pocket Max:       Life Max:  CIR:       SNF:  Outpatient:      Co-Pay:  Home Health:       Co-Pay:  DME:      Co-Pay:   Medicaid Application Date:       Case Manager:  Disability Application Date:       Case Worker:   The "Data Collection Information Summary" for patients in Inpatient Rehabilitation Facilities with attached "Privacy Act [place] Records" was provided and verbally reviewed with: Family  Emergency Contact Information Contact Information    [name] Relation Home Work [place] Son   Jeanne, [name] Spouse [phone]       Current Medical History  Patient Admitting Diagnosis: [name] ACA stroke  History of Present Illness: Jeanne [name] is a 78 y.o. right-handed female with history of hypertension, hyperlipidemia, diabetes mellitus, end-stage renal disease with hemodialysis Monday Wednesday Friday, CAD, aortic stenosis, history of right thalamic CVA 2018, tobacco abuse.   Presented 07/23/2019 with right side weakness.   Cranial CT scan showed no intracranial hemorrhage.  Chronic small vessel ischemia and generalized volume loss noted.  Patient did not receive TPA.  CT angiogram negative for large vessel occlusion however positive for significant intracranial and extracranial atherosclerotic disease.  MRI of the brain areas of acute infarction throughout the distal left anterior cerebral artery territory.  Echocardiogram is pending.  Currently maintained on aspirin and Plavix for CVA prophylaxis.  Hemodialysis ongoing as per renal services.  Therapy evaluation completed with recommendations of physical medicine rehab consult.  Complete NIHSS TOTAL: 9 Glasgow Coma Scale Score: 15  Past Medical History  Past Medical History:  Diagnosis Date  . Anemia    Pt is taking iron.   [name] Anxiety   . Arthritis   . Carotid stenosis    40-59% bilateral ICA stenosis in  2/12.  . Chronic low back pain   . CKD (chronic kidney disease)    Dr. Audie Clear at [facility]  . Coronary artery disease    Pt presented 2/10 to [facility] with NSTEMI and diastolic CHF exacerbation.  LHC was done  3/10 showing 99% pRCA stenosis and 80% calcified pLAD stenosis with [name]=>R collaterals.  Pt was referred  for CABG which was done by Dr. Prescott Gum with LIMA-LAD, SVG-RCA, SVG-OM.  . Diabetes mellitus   . Diabetic neuropathy ([place])   . Diastolic CHF (HCC)    Echo (2/10) showed EF 55-65%, mild LVH, diastolic dysfunction, mild AS with mean gradient 12 mmHg, PASP 43 mmHg.  Echo (2/12): EF 55-60%, mild LVH, mild AS (mean gradient 12), PA systolic pressure 32 mmHg.     [name] GERD (gastroesophageal reflux disease)   . Heart murmur   . Hyperlipidemia   . Hypertension   . Mild aortic stenosis    mean gradient 12 mmHg in 2/12.  . Myocardial infarction ([place])    "mild"  . Pneumonia   . PONV (postoperative nausea and vomiting)   . Stroke [facility])    " mild"  . Thrombocytopenia ([place])   . Unsteady gait     Family History  She was adopted. Family  history is unknown by patient.  Prior Rehab/Hospitalizations:  Has the patient had prior rehab or hospitalizations prior to admission? Yes  Has the patient had major surgery during 100 days prior to admission? No  Current Medications   Current Facility-Administered Medications:  .  0.9 %  sodium chloride infusion, 100 mL, Intravenous, PRN, Jeanne Racer, Jeanne [name] .  0.9 %  sodium chloride infusion, 100 mL, Intravenous, PRN, Jeanne Racer, Jeanne [name] .  acetaminophen (TYLENOL) tablet 650 mg, 650 mg, Oral, Q4H PRN, 650 mg at 07/24/19 2130 **OR** acetaminophen (TYLENOL) 160 MG/5ML solution 650 mg, 650 mg, Per Tube, Q4H PRN **OR** acetaminophen (TYLENOL) suppository 650 mg, 650 mg, Rectal, Q4H PRN, Jeanne Gravel, Jeanne [name] .  ALPRAZolam Duanne Moron) tablet 1 mg, 1 mg, Oral, Jeanne Sousa, Jeanne [name], 1 mg at 07/25/19 2129 .  alteplase (CATHFLO ACTIVASE) injection 2 mg, 2 mg, Intracatheter, Once PRN, Jeanne Racer, Jeanne [name] .  aspirin EC tablet 81 mg, 81 mg, Oral, Daily, Jeanne [name], Jeanne [name], Jeanne [name], 81 mg at 07/26/19 1000 .  calcitRIOL (ROCALTROL) capsule 0.5 mcg, 0.5 mcg, Oral, Daily, Jeanne Gravel, Jeanne [name], 0.5 mcg at 07/26/19 1000 .  Chlorhexidine Gluconate Cloth 2 % PADS 6 each, 6 each, Topical, Q0600, Jeanne Racer, Jeanne [name], 6 each at 07/25/19 0556 .  clopidogrel (PLAVIX) tablet 75 mg, 75 mg, Oral, Daily, Jeanne [name], Jeanne [name], Jeanne [name], 75 mg at 07/26/19 1000 .  diphenhydrAMINE (BENADRYL) capsule 25 mg, 25 mg, Oral, Daily PRN, Jeanne Gravel, Jeanne [name] .  folic acid (FOLVITE) tablet 1 mg, 1 mg, Oral, Daily, Jeanne Gravel, Jeanne [name], 1 mg at 07/26/19 1000 .  heparin injection 1,000 Units, 1,000 Units, Dialysis, PRN, Jeanne Racer, Jeanne [name] .  insulin aspart (novoLOG) injection 0-9 Units, 0-9 Units, Subcutaneous, TID WC, Jeanne Reasons, Jeanne [name], 3 Units at 07/26/19 1157 .  lidocaine (PF) (XYLOCAINE) 1 % injection 5 mL, 5 mL, Intradermal, PRN, Jeanne Racer, Jeanne [name] .  lidocaine-prilocaine (EMLA) cream 1 application, 1 application, Topical, PRN, Jeanne Racer,  Jeanne [name] .  lubiprostone (AMITIZA) capsule 24 mcg, 24 mcg, Oral, Q breakfast, Jeanne Gravel, Jeanne [name], 24 mcg at 07/26/19 [phone] .  midodrine (PROAMATINE) tablet 10 mg, 10 mg, Oral, Q M,W,F-HD, Jeanne Gravel, Jeanne [name], 10  mg at 07/26/19 1157 .  multivitamin (RENA-VIT) tablet 1 tablet, 1 tablet, Oral, Jeanne Sousa, Jeanne [name], 1 tablet at 07/25/19 2129 .  ondansetron (ZOFRAN) tablet 4 mg, 4 mg, Oral, Q8H PRN, Jeanne [name], Jeanne [name], Jeanne [name], 4 mg at 07/23/19 1817 .  pentafluoroprop-tetrafluoroeth (GEBAUERS) aerosol 1 application, 1 application, Topical, PRN, Jeanne Racer, Jeanne [name] .  rosuvastatin (CRESTOR) tablet 20 mg, 20 mg, Oral, q1800, Jeanne Gravel, Jeanne [name], 20 mg at 07/25/19 1811 .  sevelamer carbonate (RENVELA) tablet 1,600 mg, 1,600 mg, Oral, TID WC, Jeanne Gravel, Jeanne [name], 1,600 mg at 07/26/19 1157 .  sodium chloride flush (NS) 0.9 % injection 3 mL, 3 mL, Intravenous, Once, Jeanne [name], Jeanne Hair, Jeanne [name] .  vitamin B-12 (CYANOCOBALAMIN) tablet 500 mcg, 500 mcg, Oral, Daily, Jeanne Gravel, Jeanne [name], 500 mcg at 07/26/19 1000  Patients Current Diet:  Diet Order            Diet renal/carb modified with fluid restriction Diet-HS Snack? Nothing; Fluid restriction: 1200 mL Fluid; Room service appropriate? Yes; Fluid consistency: Thin  Diet effective now              Precautions / Restrictions Precautions Precautions: Fall Restrictions Weight Bearing Restrictions: No   Has the patient had 2 or more falls or a fall with injury in the past year?Yes  Prior Activity Level Limited Community (1-2x/wk): was not getting out of the house very often, sponge bathing with assist from spouse, but able to mobilize at home with rollator   Prior Functional Level Prior Function Level of Independence: Needs assistance Gait / Transfers Assistance Needed: no device in the home, rollator to dialysis ADL's / Homemaking Assistance Needed: sponge bathes with some help from spouse  Self Care: Did the patient need help bathing, dressing, using the toilet or eating?  Needed  some help  Indoor Mobility: Did the patient need assistance with walking from room to room (with or without device)? Independent  Stairs: Did the patient need assistance with internal or external stairs (with or without device)? Needed some help  Functional Cognition: Did the patient need help planning regular tasks such as shopping or remembering to take medications? Needed some help  Home Assistive Devices / [place] Devices/Equipment: None Home Equipment: Walker - 4 wheels, Toilet riser  Prior Device Use: Indicate devices/aids used by the patient prior to current illness, exacerbation or injury? Walker  Current Functional Level Cognition  Arousal/Alertness: Awake/alert Overall Cognitive Status: Impaired/Different from baseline Orientation Level: Oriented X4 Following Commands: Follows one step commands consistently, Follows one step commands with increased time General Comments: increased time for processing Attention: Selective Selective Attention: Appears intact Memory: Appears intact Awareness: Appears intact Problem Solving: Impaired Problem Solving Impairment: Verbal complex, Functional complex Safety/Judgment: Appears intact Comments: very delayed processing    Extremity Assessment (includes Sensation/Coordination)  Upper Extremity Assessment: RUE deficits/detail RUE Deficits / Details: brunstrom hand II, arm II; RUE Sensation: WNL RUE Coordination: decreased gross motor, decreased fine motor LUE Deficits / Details: AROM limited shoulde flexion 90 degrees, strength 3+ to 4-/5  Lower Extremity Assessment: Defer to PT evaluation RLE Deficits / Details: PROM WFL, strength 1/5 hip/knee extension with assisted weight bearing, no antigravity movement noted, no clonus noted, reports sensation intact to light touch compared to [name] LLE Deficits / Details: AROM WFL, strength 3/5 hip flexion, 4-/5 knee extension 4-/5 ankle DF    ADLs  Overall ADL's : Needs  assistance/impaired Eating/Feeding: Moderate assistance, Sitting Grooming: Moderate assistance, Sitting, Bed level, Maximal assistance Grooming Details (  indicate cue type and reason): maxA to support in sitting;able to compensate with use of LUE;assistance for bilateral coordination Upper Body Bathing: Maximal assistance, Sitting Lower Body Bathing: Maximal assistance, Sitting/lateral leans Upper Body Dressing : Maximal assistance, Sitting Lower Body Dressing: Total assistance, Sitting/lateral leans Toilet Transfer: Maximal assistance, +2 for physical assistance, +2 for safety/equipment Toilet Transfer Details (indicate cue type and reason): rolling R<>[name] for posterior pericare, pt incontinent of stool;simulated lateral scoot towards [name] to drop arm recliner Toileting- Clothing Manipulation and Hygiene: Total assistance Toileting - Clothing Manipulation Details (indicate cue type and reason): rolling R<>[name] for posterior pericare Functional mobility during ADLs: Maximal assistance, +2 for physical assistance, +2 for safety/equipment General ADL Comments: sat EOB for about 5-10 min with maxA +2 for safety;weight shifting sitting EOB with tactile and verbal cues for upright posture    Mobility  Overal bed mobility: Needs Assistance Bed Mobility: Supine to Sit Rolling: Mod assist, Total assist Sidelying to sit: Max assist, HOB elevated, +2 for safety/equipment Supine to sit: Max assist, +2 for physical assistance Sit to supine: Total assist General bed mobility comments: Able to initiate LLE movement, cues for push off through [name] elbow. MaxA + 2 to transition to edge of bed, use of bed pad to scoot hips forward to edge    Transfers  Overall transfer level: Needs assistance Equipment used: None Transfers: Lateral/Scoot Transfers, Sit to/from Stand Sit to Stand: Max assist, +2 physical assistance Stand pivot transfers: Max assist  Lateral/Scoot Transfers: Max assist, +2 physical assistance, +2  safety/equipment General transfer comment: MaxA + 2 to stand from edge of bed x 2 with right knee blocked. TotalA for peri care in standing. MaxA + 2 for lateral scoot transfer towards left. Cues for hand placement    Ambulation / Gait / Stairs / Wheelchair Mobility       Posture / Balance Dynamic Sitting Balance Sitting balance - Comments: requires maxA to maintain sitting balance EOB Balance Overall balance assessment: Needs assistance Sitting-balance support: Feet supported Sitting balance-Leahy Scale: Zero Sitting balance - Comments: requires maxA to maintain sitting balance EOB Standing balance-Leahy Scale: Zero Standing balance comment: deferred    Special needs/care consideration BiPAP/CPAP no CPM no Continuous Drip IV no Dialysis yes        Days MWF Life Vest no Oxygen no Special Bed no Trach Size no Wound Vac (area) no      Location n/a Skin skin tear to R arm                     Bowel mgmt: Bladder mgmt: Diabetic mgmt yes Behavioral consideration  Chemo/radiation      Previous Home Environment (from acute therapy documentation) Living Arrangements: Spouse/significant other  Lives With: Spouse Available Help at Discharge: Family, Available 24 hours/day Type of Home: House Home Layout: One level Home Access: Level entry Bathroom Shower/Tub: Chiropodist: Standard Home Care Services: No  Discharge Living Setting Plans for Discharge Living Setting: Patient's home Type of Home at Discharge: House Discharge Home Layout: One level Discharge Home Access: Level entry, Ramped entrance(son planning for ramp) Discharge Bathroom Shower/Tub: Tub/shower unit Discharge Bathroom Toilet: Standard(with riser) Discharge Bathroom Accessibility: Yes How Accessible: Accessible via walker Does the patient have any problems obtaining your medications?: No  Social/Family/Support Systems Anticipated Caregiver: son Ok Edwards), and husband Trish Mage) Anticipated  Caregiver's Contact Information: Ok Edwards [phone] Ability/Limitations of Caregiver: husband is 57, son owns his own [place] Caregiver Availability: 24/7 Discharge Plan Discussed  with Primary Caregiver: Yes Is Caregiver In Agreement with Plan?: Yes Does Caregiver/Family have Issues with Lodging/Transportation while Pt is in Rehab?: No   Goals/Additional Needs Patient/Family Goal for Rehab: PT/OT/SLP min assist Expected length of stay: 18-21 days Dietary Needs: renal/carb modified, 1200 mL fluid restriction Pt/Family Agrees to Admission and willing to participate: Yes Program Orientation Provided & Reviewed with Pt/Caregiver Including Roles  & Responsibilities: Yes   Decrease burden of Care through IP rehab admission: n/a   Possible need for SNF placement upon discharge: Not anticipated.  Reviewed with pt's son that SNF was not an option after CIR.  Son and family are not interested in SNF and will provide whatever care they need for pt to come home after CIR.    Patient Condition: This patient's medical and functional status has changed since the consult dated: 07/24/2019 in which the Rehabilitation Physician determined and documented that the patient's condition is appropriate for intensive rehabilitative care in an inpatient rehabilitation facility. See "History of Present Illness" (above) for medical update. Functional changes are: max +2. Patient's medical and functional status update has been discussed with the Rehabilitation physician and patient remains appropriate for inpatient rehabilitation. Will admit to inpatient rehab today.  Preadmission Screen Completed By:  Michel Santee, PT, DPT 07/26/2019 12:42 PM ______________________________________________________________________   Discussed status with Dr. Naaman Plummer on 07/26/19 at 12:50 PM  and received approval for admission tomorrow, Saturday 07/27/19, when bed is available.  Admission Coordinator:  Michel Santee, PT,  DPT time 12:50 PM Sudie Grumbling 07/26/19

## 2019-07-26 NOTE — Progress Notes (Signed)
[facility] Progress Note   Subjective:  Seen in room - eating breakfast. No CP/dyspnea or new symptoms, ongoing R hemiplegia. For HD later today.  Objective Vitals:   07/25/19 1940 07/25/19 2330 07/26/19 0353 07/26/19 0808  BP: 113/80 127/76 (!) 156/67 134/68  Pulse: 100 91 89 91  Resp: 18 18 18 20   Temp: 98.3 F (36.8 C) 98.7 F (37.1 C) 98.4 F (36.9 C) 99.1 F (37.3 C)  TempSrc: Oral Oral Oral Oral  SpO2: 97% 98% 97% 99%  Weight:       Physical Exam General:Frail woman, NAD.  Heart:RRR; 3/6 murmur Lungs:CTA anteriorly Abdomen:soft, non-tender Extremities:No LE edema; cannot move R arm or leg Dialysis Access:TDC in R chest, no tenderness  Additional Objective Labs: Basic Metabolic Panel: Recent Labs  Lab 07/23/19 0038 07/23/19 0051 07/23/19 0420 07/24/19 1250  NA 136 135 139 133*  K 4.5 4.4 2.4* 5.4*  CL 97* 100 114* 96*  CO2 25  --  19* 21*  GLUCOSE 156* 153* 100* 173*  BUN 22 23 18  45*  CREATININE 4.26* 4.20* 2.92* 6.52*  CALCIUM 9.1  --  5.7* 9.8  PHOS  --   --   --  3.8   Liver Function Tests: Recent Labs  Lab 07/23/19 0038 07/23/19 0420 07/24/19 1250  AST 28 15  --   ALT 20 11  --   ALKPHOS 98 52  --   BILITOT 0.9 0.2*  --   PROT 7.1 4.0*  --   ALBUMIN 3.2* 1.9* 3.1*   CBC: Recent Labs  Lab 07/23/19 0038 07/23/19 0051 07/23/19 0420 07/24/19 0359  WBC 9.9  --  6.9 10.4  NEUTROABS 7.5  --   --   --   HGB 12.6 13.9 7.8* 11.8*  HCT 40.8 41.0 26.0* 38.4  MCV 112.7*  --  115.0* 112.0*  PLT 125*  --  90* 128*   CBG: Recent Labs  Lab 07/25/19 0600 07/25/19 1252 07/25/19 1542 07/25/19 2115 07/26/19 0547  GLUCAP 109* 272* 226* 155* 107*   Medications: . sodium chloride    . sodium chloride     . ALPRAZolam  1 mg Oral QHS  . aspirin EC  81 mg Oral Daily  . calcitRIOL  0.5 mcg Oral Daily  . Chlorhexidine Gluconate Cloth  6 each Topical Q0600  . clopidogrel  75 mg Oral Daily  . folic acid  1 mg Oral Daily   . insulin aspart  0-9 Units Subcutaneous TID WC  . lubiprostone  24 mcg Oral Q breakfast  . midodrine  10 mg Oral Q M,W,F-HD  . multivitamin  1 tablet Oral QHS  . rosuvastatin  20 mg Oral q1800  . sevelamer carbonate  1,600 mg Oral TID WC  . sodium chloride flush  3 mL Intravenous Once  . vitamin B-12  500 mcg Oral Daily    Dialysis Orders: MWF at [facility] 4hr, 300/600, EDW 51.5kg, 2K/2.25Ca, TDC, UFP #4, heparin 1000 - Mircera 200 q 2 weeks  Assessment/Plan: 1. Acute CVA: L ACA distribution, presumed embolic. Echo with reduced EF only. Persistent R hemiparesis, speech intact. Statin started. Being eval for CIR. 2. ESRD:Continue HD per MWF sched - HD 12/11. Aiming to avoid intradialytic hypotension. No heparin. 3. Hypertension/volume:Permissive HTN initially -> now normotensive. 4. Anemia:Hgb11.8 (assuming prior was error). 5. Metabolic bone disease:Ca/Phos to goal. Continue home Sevelamer as binder. 6. T2DM 7. CAD (Hx CABG)  Veneta Penton, PA-C 07/26/2019, 10:54 AM  [facility] Pager: [phone]   

## 2019-07-26 NOTE — Progress Notes (Signed)
[name], PT  Rehab Admission Coordinator  Physical Medicine and Rehabilitation  PMR Pre-admission  Signed  Date of Service:  07/26/2019 12:42 PM      Related encounter: ED to Hosp-Admission (Current) from 07/23/2019 in [place]        Show:Clear all '[x]' Manual'[x]' Template'[x]' Copied  Added by: '[x]' [name], PT  '[]' Hover for details PMR Admission Coordinator Pre-Admission Assessment   Patient: [name] is an 78 y.o., female MRN: [id number] DOB: 09-Oct-1940 Height:   Weight: 54.4 kg                                                                                                                                                  Insurance Information HMO:     PPO: yes     PCP:      IPA:      80/20:      OTHER:  PRIMARY: [employer] Medicare      Policy#: [id number]      Subscriber: pt CM Name: Hinton Dyer      Phone#: [phone]     Fax#: [phone] Pre-Cert#: [id number]      Employer:  Benefits:  Phone #: [phone]     Name:  Eff. Date:  08/15/14     Deduct: $0      Out of Pocket Max: $5900 (met for 2020)      Life Max: n/a CIR: $375/day with max copay of $1500      SNF: 20 full days Outpatient:      Co-Pay: $35/visit Home Health: 100%      Co-Pay:  DME: 80%     Co-Pay: 20% Providers: preferred network  SECONDARY:       Policy#:       Subscriber:  CM Name:       Phone#:      Fax#:  Pre-Cert#:       Employer:  Benefits:  Phone #:      Name:  Eff. Date:      Deduct:       Out of Pocket Max:       Life Max:  CIR:       SNF:  Outpatient:      Co-Pay:  Home Health:       Co-Pay:  DME:      Co-Pay:    Medicaid Application Date:       Case Manager:  Disability Application Date:       Case Worker:    The "Data Collection Information Summary" for patients in Inpatient Rehabilitation Facilities with attached "Privacy Act [place] Records" was provided and verbally reviewed with: Family   Emergency  Contact Information         Contact Information     Name Relation Home Work Mobile  Syvilla, Martin     [phone]    Marriana, Hibberd Spouse [phone]           Current Medical History  Patient Admitting Diagnosis: L ACA stroke   History of Present Illness: [name] is a 78 y.o. right-handed female with history of hypertension, hyperlipidemia, diabetes mellitus, end-stage renal disease with hemodialysis Monday Wednesday Friday, CAD, aortic stenosis, history of right thalamic CVA 2018, tobacco abuse.   Presented 07/23/2019 with right side weakness.  Cranial CT scan showed no intracranial hemorrhage.  Chronic small vessel ischemia and generalized volume loss noted.  Patient did not receive TPA.  CT angiogram negative for large vessel occlusion however positive for significant intracranial and extracranial atherosclerotic disease.  MRI of the brain areas of acute infarction throughout the distal left anterior cerebral artery territory.  Echocardiogram is pending.  Currently maintained on aspirin and Plavix for CVA prophylaxis.  Hemodialysis ongoing as per renal services.  Therapy evaluation completed with recommendations of physical medicine rehab consult.   Complete NIHSS TOTAL: 9 Glasgow Coma Scale Score: 15   Past Medical History      Past Medical History:  Diagnosis Date  . Anemia      Pt is taking iron.   [name] Anxiety    . Arthritis    . Carotid stenosis      40-59% bilateral ICA stenosis in 2/12.  . Chronic low back pain    . CKD (chronic kidney disease)      Dr. Audie Clear at [facility]  . Coronary artery disease      Pt presented 2/10 to [facility] with NSTEMI and diastolic CHF exacerbation.  LHC was done  3/10 showing 99% pRCA stenosis and 80% calcified pLAD stenosis with L=>R collaterals.  Pt was referred  for CABG which was done by Dr. Prescott Gum with LIMA-LAD, SVG-RCA, SVG-OM.  . Diabetes mellitus    . Diabetic neuropathy ([place])    . Diastolic CHF (HCC)       Echo (2/10) showed EF 55-65%, mild LVH, diastolic dysfunction, mild AS with mean gradient 12 mmHg, PASP 43 mmHg.  Echo (2/12): EF 55-60%, mild LVH, mild AS (mean gradient 12), PA systolic pressure 32 mmHg.     [name] GERD (gastroesophageal reflux disease)    . Heart murmur    . Hyperlipidemia    . Hypertension    . Mild aortic stenosis      mean gradient 12 mmHg in 2/12.  . Myocardial infarction ([place])      "mild"  . Pneumonia    . PONV (postoperative nausea and vomiting)    . Stroke [facility])      " mild"  . Thrombocytopenia ([place])    . Unsteady gait        Family History  She was adopted. Family history is unknown by patient.   Prior Rehab/Hospitalizations:  Has the patient had prior rehab or hospitalizations prior to admission? Yes   Has the patient had major surgery during 100 days prior to admission? No   Current Medications    Current Facility-Administered Medications:  .  0.9 %  sodium chloride infusion, 100 mL, Intravenous, PRN, Loren Racer, PA-C .  0.9 %  sodium chloride infusion, 100 mL, Intravenous, PRN, Loren Racer, PA-C .  acetaminophen (TYLENOL) tablet 650 mg, 650 mg, Oral, Q4H PRN, 650 mg at 07/24/19 2130 **OR** acetaminophen (TYLENOL) 160 MG/5ML solution 650 mg, 650 mg, Per Tube, Q4H PRN **OR** acetaminophen (TYLENOL) suppository 650 mg,  650 mg, Rectal, Q4H PRN, Jani Gravel, MD .  ALPRAZolam Duanne Moron) tablet 1 mg, 1 mg, Oral, Loma Sousa, MD, 1 mg at 07/25/19 2129 .  alteplase (CATHFLO ACTIVASE) injection 2 mg, 2 mg, Intracatheter, Once PRN, Loren Racer, PA-C .  aspirin EC tablet 81 mg, 81 mg, Oral, Daily, Biby, Sharon L, NP, 81 mg at 07/26/19 1000 .  calcitRIOL (ROCALTROL) capsule 0.5 mcg, 0.5 mcg, Oral, Daily, Jani Gravel, MD, 0.5 mcg at 07/26/19 1000 .  Chlorhexidine Gluconate Cloth 2 % PADS 6 each, 6 each, Topical, Q0600, Loren Racer, PA-C, 6 each at 07/25/19 0556 .  clopidogrel (PLAVIX) tablet 75 mg, 75 mg, Oral, Daily, Biby, Sharon L, NP, 75  mg at 07/26/19 1000 .  diphenhydrAMINE (BENADRYL) capsule 25 mg, 25 mg, Oral, Daily PRN, Jani Gravel, MD .  folic acid (FOLVITE) tablet 1 mg, 1 mg, Oral, Daily, Jani Gravel, MD, 1 mg at 07/26/19 1000 .  heparin injection 1,000 Units, 1,000 Units, Dialysis, PRN, Loren Racer, PA-C .  insulin aspart (novoLOG) injection 0-9 Units, 0-9 Units, Subcutaneous, TID WC, Florencia Reasons, MD, 3 Units at 07/26/19 1157 .  lidocaine (PF) (XYLOCAINE) 1 % injection 5 mL, 5 mL, Intradermal, PRN, Loren Racer, PA-C .  lidocaine-prilocaine (EMLA) cream 1 application, 1 application, Topical, PRN, Loren Racer, PA-C .  lubiprostone (AMITIZA) capsule 24 mcg, 24 mcg, Oral, Q breakfast, Jani Gravel, MD, 24 mcg at 07/26/19 [phone] .  midodrine (PROAMATINE) tablet 10 mg, 10 mg, Oral, Q M,W,F-HD, Jani Gravel, MD, 10 mg at 07/26/19 1157 .  multivitamin (RENA-VIT) tablet 1 tablet, 1 tablet, Oral, Loma Sousa, MD, 1 tablet at 07/25/19 2129 .  ondansetron (ZOFRAN) tablet 4 mg, 4 mg, Oral, Q8H PRN, Vann, Jessica U, DO, 4 mg at 07/23/19 1817 .  pentafluoroprop-tetrafluoroeth (GEBAUERS) aerosol 1 application, 1 application, Topical, PRN, Loren Racer, PA-C .  rosuvastatin (CRESTOR) tablet 20 mg, 20 mg, Oral, q1800, Jani Gravel, MD, 20 mg at 07/25/19 1811 .  sevelamer carbonate (RENVELA) tablet 1,600 mg, 1,600 mg, Oral, TID WC, Jani Gravel, MD, 1,600 mg at 07/26/19 1157 .  sodium chloride flush (NS) 0.9 % injection 3 mL, 3 mL, Intravenous, Once, Horton, Barbette Hair, MD .  vitamin B-12 (CYANOCOBALAMIN) tablet 500 mcg, 500 mcg, Oral, Daily, Jani Gravel, MD, 500 mcg at 07/26/19 1000   Patients Current Diet:     Diet Order                      Diet renal/carb modified with fluid restriction Diet-HS Snack? Nothing; Fluid restriction: 1200 mL Fluid; Room service appropriate? Yes; Fluid consistency: Thin  Diet effective now                   Precautions / Restrictions Precautions Precautions:  Fall Restrictions Weight Bearing Restrictions: No    Has the patient had 2 or more falls or a fall with injury in the past year?Yes   Prior Activity Level Limited Community (1-2x/wk): was not getting out of the house very often, sponge bathing with assist from spouse, but able to mobilize at home with rollator    Prior Functional Level Prior Function Level of Independence: Needs assistance Gait / Transfers Assistance Needed: no device in the home, rollator to dialysis ADL's / Homemaking Assistance Needed: sponge bathes with some help from spouse   Self Care: Did the patient need help bathing, dressing, using the toilet or eating?  Needed some help  Indoor Mobility: Did the patient need assistance with walking from room to room (with or without device)? Independent   Stairs: Did the patient need assistance with internal or external stairs (with or without device)? Needed some help   Functional Cognition: Did the patient need help planning regular tasks such as shopping or remembering to take medications? Needed some help   Home Assistive Devices / [place] Devices/Equipment: None Home Equipment: Walker - 4 wheels, Toilet riser   Prior Device Use: Indicate devices/aids used by the patient prior to current illness, exacerbation or injury? Walker   Current Functional Level Cognition   Arousal/Alertness: Awake/alert Overall Cognitive Status: Impaired/Different from baseline Orientation Level: Oriented X4 Following Commands: Follows one step commands consistently, Follows one step commands with increased time General Comments: increased time for processing Attention: Selective Selective Attention: Appears intact Memory: Appears intact Awareness: Appears intact Problem Solving: Impaired Problem Solving Impairment: Verbal complex, Functional complex Safety/Judgment: Appears intact Comments: very delayed processing    Extremity Assessment (includes  Sensation/Coordination)   Upper Extremity Assessment: RUE deficits/detail RUE Deficits / Details: brunstrom hand II, arm II; RUE Sensation: WNL RUE Coordination: decreased gross motor, decreased fine motor LUE Deficits / Details: AROM limited shoulde flexion 90 degrees, strength 3+ to 4-/5  Lower Extremity Assessment: Defer to PT evaluation RLE Deficits / Details: PROM WFL, strength 1/5 hip/knee extension with assisted weight bearing, no antigravity movement noted, no clonus noted, reports sensation intact to light touch compared to L LLE Deficits / Details: AROM WFL, strength 3/5 hip flexion, 4-/5 knee extension 4-/5 ankle DF     ADLs   Overall ADL's : Needs assistance/impaired Eating/Feeding: Moderate assistance, Sitting Grooming: Moderate assistance, Sitting, Bed level, Maximal assistance Grooming Details (indicate cue type and reason): maxA to support in sitting;able to compensate with use of LUE;assistance for bilateral coordination Upper Body Bathing: Maximal assistance, Sitting Lower Body Bathing: Maximal assistance, Sitting/lateral leans Upper Body Dressing : Maximal assistance, Sitting Lower Body Dressing: Total assistance, Sitting/lateral leans Toilet Transfer: Maximal assistance, +2 for physical assistance, +2 for safety/equipment Toilet Transfer Details (indicate cue type and reason): rolling R<>L for posterior pericare, pt incontinent of stool;simulated lateral scoot towards L to drop arm recliner Toileting- Clothing Manipulation and Hygiene: Total assistance Toileting - Clothing Manipulation Details (indicate cue type and reason): rolling R<>L for posterior pericare Functional mobility during ADLs: Maximal assistance, +2 for physical assistance, +2 for safety/equipment General ADL Comments: sat EOB for about 5-10 min with maxA +2 for safety;weight shifting sitting EOB with tactile and verbal cues for upright posture     Mobility   Overal bed mobility: Needs Assistance Bed  Mobility: Supine to Sit Rolling: Mod assist, Total assist Sidelying to sit: Max assist, HOB elevated, +2 for safety/equipment Supine to sit: Max assist, +2 for physical assistance Sit to supine: Total assist General bed mobility comments: Able to initiate LLE movement, cues for push off through L elbow. MaxA + 2 to transition to edge of bed, use of bed pad to scoot hips forward to edge     Transfers   Overall transfer level: Needs assistance Equipment used: None Transfers: Lateral/Scoot Transfers, Sit to/from Stand Sit to Stand: Max assist, +2 physical assistance Stand pivot transfers: Max assist  Lateral/Scoot Transfers: Max assist, +2 physical assistance, +2 safety/equipment General transfer comment: MaxA + 2 to stand from edge of bed x 2 with right knee blocked. TotalA for peri care in standing. MaxA + 2 for lateral scoot transfer towards left. Cues for  hand placement     Ambulation / Gait / Stairs / Wheelchair Mobility         Posture / Balance Dynamic Sitting Balance Sitting balance - Comments: requires maxA to maintain sitting balance EOB Balance Overall balance assessment: Needs assistance Sitting-balance support: Feet supported Sitting balance-Leahy Scale: Zero Sitting balance - Comments: requires maxA to maintain sitting balance EOB Standing balance-Leahy Scale: Zero Standing balance comment: deferred     Special needs/care consideration BiPAP/CPAP no CPM no Continuous Drip IV no Dialysis yes        Days MWF Life Vest no Oxygen no Special Bed no Trach Size no Wound Vac (area) no      Location n/a Skin skin tear to R arm                     Bowel mgmt: Bladder mgmt: Diabetic mgmt yes Behavioral consideration  Chemo/radiation         Previous Home Environment (from acute therapy documentation) Living Arrangements: Spouse/significant other  Lives With: Spouse Available Help at Discharge: Family, Available 24 hours/day Type of Home: House Home Layout: One  level Home Access: Level entry Bathroom Shower/Tub: Chiropodist: Standard Home Care Services: No   Discharge Living Setting Plans for Discharge Living Setting: Patient's home Type of Home at Discharge: House Discharge Home Layout: One level Discharge Home Access: Level entry, Ramped entrance(son planning for ramp) Discharge Bathroom Shower/Tub: Tub/shower unit Discharge Bathroom Toilet: Standard(with riser) Discharge Bathroom Accessibility: Yes How Accessible: Accessible via walker Does the patient have any problems obtaining your medications?: No   Social/Family/Support Systems Anticipated Caregiver: son Ok Edwards), and husband Trish Mage) Anticipated Caregiver's Contact Information: Ok Edwards [phone] Ability/Limitations of Caregiver: husband is 37, son owns his own [employer] Caregiver Availability: 24/7 Discharge Plan Discussed with Primary Caregiver: Yes Is Caregiver In Agreement with Plan?: Yes Does Caregiver/Family have Issues with Lodging/Transportation while Pt is in Rehab?: No     Goals/Additional Needs Patient/Family Goal for Rehab: PT/OT/SLP min assist Expected length of stay: 18-21 days Dietary Needs: renal/carb modified, 1200 mL fluid restriction Pt/Family Agrees to Admission and willing to participate: Yes Program Orientation Provided & Reviewed with Pt/Caregiver Including Roles  & Responsibilities: Yes     Decrease burden of Care through IP rehab admission: n/a     Possible need for SNF placement upon discharge: Not anticipated.  Reviewed with pt's son that SNF was not an option after CIR.  Son and family are not interested in SNF and will provide whatever care they need for pt to come home after CIR.      Patient Condition: This patient's medical and functional status has changed since the consult dated: 07/24/2019 in which the Rehabilitation Physician determined and documented that the patient's condition is appropriate for intensive  rehabilitative care in an inpatient rehabilitation facility. See "History of Present Illness" (above) for medical update. Functional changes are: max +2. Patient's medical and functional status update has been discussed with the Rehabilitation physician and patient remains appropriate for inpatient rehabilitation. Will admit to inpatient rehab today.   Preadmission Screen Completed By:  [name], PT, DPT 07/26/2019 12:42 PM ______________________________________________________________________   Discussed status with Dr. Naaman Plummer on 07/26/19 at 12:50 PM  and received approval for admission tomorrow, Saturday 07/27/19, when bed is available.   Admission Coordinator:  [name], PT, DPT time 12:50 PM [name] 07/26/19          Cosigned by: Meredith Staggers, MD at  07/26/2019  1:05 PM

## 2019-07-26 NOTE — Progress Notes (Addendum)
PROGRESS NOTE  [name] [id number] DOB: 06/18/1941 DOA: 07/23/2019 PCP: [name], MD  Brief history: Patient came from HD after an unresponsive episode.  On MRI found to have: Areas of acute infarction throughout the distal left anterior cerebral artery territory.   HPI/Recap of past 24 hours:  Uneventful night , no improvement in right sided dense hemiplegia  she denies trouble swallowing, She is started on sliding scale insulin for elevated blood glucose  She is going to dialysis this afternoon  Assessment/Plan: Principal Problem:   Stroke 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  - CT angiogram negative for large vessel occlusion, however positive for significant intracranial and extracranial  atherosclerotic disease. 1. No emergent large vessel occlusion. 2. 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 3. 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 4. Bilateral carotid calcific atherosclerosis with 50% stenosis of the proximal right ICA. 5. Aortic Atherosclerosis (ICD10-I70.0). -2D echo obtained on 12/8, result pending -LDL 16, A1c 5.3 -Was previously not on any antithrombotic biotic prior to admission due to history of GI bleed, she is now agreeable to aspirin enteric coating 81 mg daily with Plavix for 3 weeks then aspirin 81 mg alone per neurology recommendation - got insurance approval for CIR placement  ,however there is no bed today, we will plan to discharge to CIR tomorrow pending bed availability.  Addendum: Echo cardiogram showed "catheter in right atrium  with oscillating density (consider vegetation vs thrombus)." Case discussed with neurology Dr. Leonie Man who recommended TEE, case discussed with cardiology PA Suanne Marker who is going to schedule TEE.   History of GI bleed in March 2018 required PRBC transfusion in the past -Per chart review she does not desire any GI work-up - H&H has been stable here, continue monitor  ESRD on hemodialysis Monday Wednesday Friday, plan per nephrology Anemia of chronic disease    H/o NSVT -had7beats of NSVT on tele during dialysis on 12/9 pm -Blood pressure is too low for betablocker or ccb -keep on tele  CAD prior CABG, denies chest pain history of chronic combined systolic and diastolic heart failure,: Managed by dialysis For a bicuspid aortic valve with moderate aortic stenosis, per cardiology she is not a candidate for surgery or TAVR due to her comorbidities  History of type 2 diabetes with diabetic neuropathy, diet controlled Blood glucose elevated, started insulin sliding scale from December 10   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 Carlis Abbott  no surgical intervention needed as long as patient remains asymptomatic F/u with Dr Carlis Abbott as needed   Stage I decubitus ulcer sacral present on presentation, pressure offloading measures  DVT Prophylaxis:scd's  Code Status: Full  Family Communication: patient , husband over the phone on 12/9 and December 11   Disposition Plan: awaiting for CIR placement, likely go to CR tomorrow pending bed availability   Consultants:  Neurology  Nephrology  CIR  Vascular surgery Dr. Carlis Abbott over the phone  Procedures:  Dialysis Monday Wednesday Friday  Antibiotics:  None  Objective: BP 131/65 (BP Location: Left Arm)   Pulse 94   Temp 98.5 F (36.9 C) (Oral)   Resp 16   Wt 54.4 kg   SpO2 100%   BMI 18.24 kg/m   Intake/Output Summary (Last 24 hours) at  07/26/2019 1255 Last data filed at 07/26/2019 1047 Gross per 24 hour  Intake 600 ml  Output -  Net 600 ml   Filed Weights   07/24/19 1245 07/24/19 1615 07/25/19 0351  Weight: 54.1 kg 53.3 kg 54.4 kg    Exam: Patient is examined daily including today on 07/26/2019, exams remain the same as of yesterday except that has changed    General:  Frail, chronically ill appearing but NAD  Cardiovascular: RRR  Respiratory: CTABL  Abdomen: Soft/ND/NT, positive BS  Musculoskeletal: No Edema  Neuro: alert, oriented , dense right hemiplegia, sensation intact  Data Reviewed: Basic Metabolic Panel: Recent Labs  Lab 07/23/19 0038 07/23/19 0051 07/23/19 0420 07/24/19 1250 07/26/19 1158  NA 136 135 139 133* 130*  K 4.5 4.4 2.4* 5.4* 4.7  CL 97* 100 114* 96* 94*  CO2 25  --  19* 21* 23  GLUCOSE 156* 153* 100* 173* 242*  BUN 22 23 18  45* 58*  CREATININE 4.26* 4.20* 2.92* 6.52* 6.86*  CALCIUM 9.1  --  5.7* 9.8 9.1  MG  --   --  1.4*  --   --   PHOS  --   --   --  3.8 2.5   Liver Function Tests: Recent Labs  Lab 07/23/19 0038 07/23/19 0420 07/24/19 1250 07/26/19 1158  AST 28 15  --   --   ALT 20 11  --   --   ALKPHOS 98 52  --   --   BILITOT 0.9 0.2*  --   --   PROT 7.1 4.0*  --   --   ALBUMIN 3.2* 1.9* 3.1* 2.7*   No results for input(s): LIPASE, AMYLASE in the last 168 hours. No results for input(s): AMMONIA in the last 168 hours. CBC: Recent Labs  Lab 07/23/19 0038 07/23/19 0051 07/23/19 0420 07/24/19 0359 07/26/19 1158  WBC 9.9  --  6.9 10.4 10.4  NEUTROABS 7.5  --   --   --   --   HGB 12.6 13.9 7.8* 11.8* 11.7*  HCT 40.8 41.0 26.0* 38.4 36.4  MCV 112.7*  --  115.0* 112.0* 108.3*  PLT 125*  --  90* 128* 112*   Cardiac Enzymes:   No results for input(s): CKTOTAL, CKMB, CKMBINDEX, TROPONINI in the last 168 hours. BNP (last 3 results) No results for input(s): BNP in the last 8760 hours.  ProBNP (last 3 results) No results for input(s): PROBNP in the last  8760 hours.  CBG: Recent Labs  Lab 07/25/19 1252 07/25/19 1542 07/25/19 2115 07/26/19 0547 07/26/19 1117  GLUCAP 272* 226* 155* 107* 216*    Recent Results (from the past 240 hour(s))  SARS CORONAVIRUS 2 (TAT 6-24 HRS) Nasopharyngeal Nasopharyngeal Swab     Status: None   Collection Time: 07/23/19  2:02 AM   Specimen: Nasopharyngeal Swab  Result Value Ref Range Status   SARS Coronavirus 2 NEGATIVE NEGATIVE Final    Comment: (NOTE) SARS-CoV-2 target nucleic acids are NOT DETECTED. The SARS-CoV-2 RNA is generally detectable in upper and lower respiratory specimens during the acute phase of infection. Negative results do not preclude SARS-CoV-2 infection, do not rule out co-infections with other pathogens, and should not be used as the  sole basis for treatment or other patient management decisions. Negative results must be combined with clinical observations, patient history, and epidemiological information. The expected result is Negative. Fact Sheet for Patients: SugarRoll.be Fact Sheet for Healthcare Providers: https://www.woods-mathews.com/ This test is not yet approved or cleared by the [place] FDA and  has been authorized for detection and/or diagnosis of SARS-CoV-2 by FDA under an Emergency Use Authorization (EUA). This EUA will remain  in effect (meaning this test can be used) for the duration of the COVID-19 declaration under Section 56 4(b)(1) of the Act, 21 U.S.C. section 360bbb-3(b)(1), unless the authorization is terminated or revoked sooner. Performed at [facility], [place] [address] [place], [place] [address]      Studies: No results found.  Scheduled Meds: . ALPRAZolam  1 mg Oral QHS  . aspirin EC  81 mg Oral Daily  . calcitRIOL  0.5 mcg Oral Daily  . Chlorhexidine Gluconate Cloth  6 each Topical Q0600  . clopidogrel  75 mg Oral Daily  . folic acid  1 mg Oral Daily  . insulin aspart  0-9 Units  Subcutaneous TID WC  . lubiprostone  24 mcg Oral Q breakfast  . midodrine  10 mg Oral Q M,W,F-HD  . multivitamin  1 tablet Oral QHS  . rosuvastatin  20 mg Oral q1800  . sevelamer carbonate  1,600 mg Oral TID WC  . sodium chloride flush  3 mL Intravenous Once  . vitamin B-12  500 mcg Oral Daily    Continuous Infusions: . sodium chloride    . sodium chloride       Time spent: 35 mins I have personally reviewed and interpreted on  07/26/2019 daily labs, tele strips, imagings as discussed above under date review session and assessment and plans.  I reviewed all nursing notes, pharmacy notes, consultant notes,  vitals, pertinent old records  I have discussed plan of care as described above with RN , patient and husband over the phone  on 07/26/2019   Florencia Reasons MD, PhD, FACP  [facility] Pager [phone]. If 7PM-7AM, please contact night-coverage at [email or web address], password [facility] 07/26/2019, 12:55 PM  LOS: 3 days

## 2019-07-26 NOTE — Progress Notes (Signed)
PT Cancellation Note  Patient Details Name: [name] MRN: [id number] DOB: 09-22-1940   Cancelled Treatment:    Reason Eval/Treat Not Completed: Patient at procedure or test/unavailable (HD).  Ellamae Sia, PT, DPT Acute Rehabilitation Services Pager [phone] Office [phone]    Willy Eddy 07/26/2019, 1:12 PM

## 2019-07-27 ENCOUNTER — Inpatient Hospital Stay (HOSPITAL_COMMUNITY)
Admission: RE | Admit: 2019-07-27 | Discharge: 2019-08-24 | DRG: 056 | Disposition: A | Discharge status: Home Health | Payer: Medicare HMO | Source: Intra-hospital | Attending: Physical Medicine & Rehabilitation | Admitting: Physical Medicine & Rehabilitation

## 2019-07-27 ENCOUNTER — Encounter (HOSPITAL_COMMUNITY): Payer: Self-pay | Admitting: Physical Medicine & Rehabilitation

## 2019-07-27 DIAGNOSIS — I132 Hypertensive heart and chronic kidney disease with heart failure and with stage 5 chronic kidney disease, or end stage renal disease: Secondary | ICD-10-CM | POA: Diagnosis present

## 2019-07-27 DIAGNOSIS — K5731 Diverticulosis of large intestine without perforation or abscess with bleeding: Secondary | ICD-10-CM | POA: Diagnosis not present

## 2019-07-27 DIAGNOSIS — K922 Gastrointestinal hemorrhage, unspecified: Secondary | ICD-10-CM | POA: Diagnosis not present

## 2019-07-27 DIAGNOSIS — I959 Hypotension, unspecified: Secondary | ICD-10-CM | POA: Diagnosis not present

## 2019-07-27 DIAGNOSIS — R188 Other ascites: Secondary | ICD-10-CM | POA: Diagnosis not present

## 2019-07-27 DIAGNOSIS — K219 Gastro-esophageal reflux disease without esophagitis: Secondary | ICD-10-CM | POA: Diagnosis present

## 2019-07-27 DIAGNOSIS — K7460 Unspecified cirrhosis of liver: Secondary | ICD-10-CM | POA: Diagnosis present

## 2019-07-27 DIAGNOSIS — Z7189 Other specified counseling: Secondary | ICD-10-CM

## 2019-07-27 DIAGNOSIS — R5383 Other fatigue: Secondary | ICD-10-CM | POA: Diagnosis not present

## 2019-07-27 DIAGNOSIS — R140 Abdominal distension (gaseous): Secondary | ICD-10-CM

## 2019-07-27 DIAGNOSIS — L89151 Pressure ulcer of sacral region, stage 1: Secondary | ICD-10-CM | POA: Diagnosis not present

## 2019-07-27 DIAGNOSIS — N3020 Other chronic cystitis without hematuria: Secondary | ICD-10-CM

## 2019-07-27 DIAGNOSIS — R197 Diarrhea, unspecified: Secondary | ICD-10-CM

## 2019-07-27 DIAGNOSIS — R161 Splenomegaly, not elsewhere classified: Secondary | ICD-10-CM | POA: Diagnosis present

## 2019-07-27 DIAGNOSIS — E1122 Type 2 diabetes mellitus with diabetic chronic kidney disease: Secondary | ICD-10-CM | POA: Diagnosis present

## 2019-07-27 DIAGNOSIS — Z66 Do not resuscitate: Secondary | ICD-10-CM | POA: Diagnosis not present

## 2019-07-27 DIAGNOSIS — I69351 Hemiplegia and hemiparesis following cerebral infarction affecting right dominant side: Principal | ICD-10-CM

## 2019-07-27 DIAGNOSIS — D631 Anemia in chronic kidney disease: Secondary | ICD-10-CM | POA: Diagnosis present

## 2019-07-27 DIAGNOSIS — N390 Urinary tract infection, site not specified: Secondary | ICD-10-CM | POA: Diagnosis not present

## 2019-07-27 DIAGNOSIS — E785 Hyperlipidemia, unspecified: Secondary | ICD-10-CM | POA: Diagnosis present

## 2019-07-27 DIAGNOSIS — I639 Cerebral infarction, unspecified: Secondary | ICD-10-CM | POA: Diagnosis not present

## 2019-07-27 DIAGNOSIS — R509 Fever, unspecified: Secondary | ICD-10-CM | POA: Diagnosis not present

## 2019-07-27 DIAGNOSIS — R7309 Other abnormal glucose: Secondary | ICD-10-CM | POA: Diagnosis not present

## 2019-07-27 DIAGNOSIS — Z951 Presence of aortocoronary bypass graft: Secondary | ICD-10-CM

## 2019-07-27 DIAGNOSIS — A4159 Other Gram-negative sepsis: Secondary | ICD-10-CM | POA: Diagnosis not present

## 2019-07-27 DIAGNOSIS — K560 Paralytic ileus: Secondary | ICD-10-CM | POA: Diagnosis not present

## 2019-07-27 DIAGNOSIS — I953 Hypotension of hemodialysis: Secondary | ICD-10-CM | POA: Diagnosis not present

## 2019-07-27 DIAGNOSIS — E119 Type 2 diabetes mellitus without complications: Secondary | ICD-10-CM | POA: Diagnosis not present

## 2019-07-27 DIAGNOSIS — M2550 Pain in unspecified joint: Secondary | ICD-10-CM | POA: Diagnosis not present

## 2019-07-27 DIAGNOSIS — I5032 Chronic diastolic (congestive) heart failure: Secondary | ICD-10-CM | POA: Diagnosis not present

## 2019-07-27 DIAGNOSIS — Z7401 Bed confinement status: Secondary | ICD-10-CM | POA: Diagnosis not present

## 2019-07-27 DIAGNOSIS — Z992 Dependence on renal dialysis: Secondary | ICD-10-CM | POA: Diagnosis not present

## 2019-07-27 DIAGNOSIS — I083 Combined rheumatic disorders of mitral, aortic and tricuspid valves: Secondary | ICD-10-CM | POA: Diagnosis not present

## 2019-07-27 DIAGNOSIS — R109 Unspecified abdominal pain: Secondary | ICD-10-CM | POA: Diagnosis not present

## 2019-07-27 DIAGNOSIS — A4150 Gram-negative sepsis, unspecified: Secondary | ICD-10-CM | POA: Diagnosis not present

## 2019-07-27 DIAGNOSIS — R34 Anuria and oliguria: Secondary | ICD-10-CM | POA: Diagnosis not present

## 2019-07-27 DIAGNOSIS — M898X9 Other specified disorders of bone, unspecified site: Secondary | ICD-10-CM | POA: Diagnosis present

## 2019-07-27 DIAGNOSIS — D62 Acute posthemorrhagic anemia: Secondary | ICD-10-CM | POA: Diagnosis not present

## 2019-07-27 DIAGNOSIS — J150 Pneumonia due to Klebsiella pneumoniae: Secondary | ICD-10-CM | POA: Diagnosis not present

## 2019-07-27 DIAGNOSIS — Z888 Allergy status to other drugs, medicaments and biological substances status: Secondary | ICD-10-CM

## 2019-07-27 DIAGNOSIS — K909 Intestinal malabsorption, unspecified: Secondary | ICD-10-CM | POA: Diagnosis not present

## 2019-07-27 DIAGNOSIS — I252 Old myocardial infarction: Secondary | ICD-10-CM

## 2019-07-27 DIAGNOSIS — M1990 Unspecified osteoarthritis, unspecified site: Secondary | ICD-10-CM | POA: Diagnosis present

## 2019-07-27 DIAGNOSIS — I5043 Acute on chronic combined systolic (congestive) and diastolic (congestive) heart failure: Secondary | ICD-10-CM | POA: Diagnosis not present

## 2019-07-27 DIAGNOSIS — E1165 Type 2 diabetes mellitus with hyperglycemia: Secondary | ICD-10-CM | POA: Diagnosis not present

## 2019-07-27 DIAGNOSIS — I63322 Cerebral infarction due to thrombosis of left anterior cerebral artery: Secondary | ICD-10-CM | POA: Diagnosis not present

## 2019-07-27 DIAGNOSIS — I082 Rheumatic disorders of both aortic and tricuspid valves: Secondary | ICD-10-CM | POA: Diagnosis present

## 2019-07-27 DIAGNOSIS — L89159 Pressure ulcer of sacral region, unspecified stage: Secondary | ICD-10-CM | POA: Diagnosis present

## 2019-07-27 DIAGNOSIS — K589 Irritable bowel syndrome without diarrhea: Secondary | ICD-10-CM | POA: Diagnosis present

## 2019-07-27 DIAGNOSIS — I120 Hypertensive chronic kidney disease with stage 5 chronic kidney disease or end stage renal disease: Secondary | ICD-10-CM | POA: Diagnosis not present

## 2019-07-27 DIAGNOSIS — N250 Renal osteodystrophy: Secondary | ICD-10-CM | POA: Diagnosis not present

## 2019-07-27 DIAGNOSIS — N186 End stage renal disease: Secondary | ICD-10-CM | POA: Diagnosis not present

## 2019-07-27 DIAGNOSIS — E1140 Type 2 diabetes mellitus with diabetic neuropathy, unspecified: Secondary | ICD-10-CM | POA: Diagnosis present

## 2019-07-27 DIAGNOSIS — I2510 Atherosclerotic heart disease of native coronary artery without angina pectoris: Secondary | ICD-10-CM | POA: Diagnosis present

## 2019-07-27 DIAGNOSIS — G459 Transient cerebral ischemic attack, unspecified: Secondary | ICD-10-CM | POA: Diagnosis not present

## 2019-07-27 DIAGNOSIS — G8191 Hemiplegia, unspecified affecting right dominant side: Secondary | ICD-10-CM | POA: Diagnosis not present

## 2019-07-27 DIAGNOSIS — G8929 Other chronic pain: Secondary | ICD-10-CM | POA: Diagnosis present

## 2019-07-27 DIAGNOSIS — K567 Ileus, unspecified: Secondary | ICD-10-CM

## 2019-07-27 DIAGNOSIS — I340 Nonrheumatic mitral (valve) insufficiency: Secondary | ICD-10-CM | POA: Diagnosis not present

## 2019-07-27 DIAGNOSIS — I472 Ventricular tachycardia: Secondary | ICD-10-CM | POA: Diagnosis not present

## 2019-07-27 DIAGNOSIS — Z881 Allergy status to other antibiotic agents status: Secondary | ICD-10-CM

## 2019-07-27 DIAGNOSIS — L89319 Pressure ulcer of right buttock, unspecified stage: Secondary | ICD-10-CM | POA: Diagnosis present

## 2019-07-27 DIAGNOSIS — E8770 Fluid overload, unspecified: Secondary | ICD-10-CM | POA: Diagnosis not present

## 2019-07-27 DIAGNOSIS — Z681 Body mass index (BMI) 19 or less, adult: Secondary | ICD-10-CM | POA: Diagnosis not present

## 2019-07-27 DIAGNOSIS — Z91018 Allergy to other foods: Secondary | ICD-10-CM

## 2019-07-27 DIAGNOSIS — Z515 Encounter for palliative care: Secondary | ICD-10-CM

## 2019-07-27 DIAGNOSIS — I499 Cardiac arrhythmia, unspecified: Secondary | ICD-10-CM | POA: Diagnosis not present

## 2019-07-27 DIAGNOSIS — B9689 Other specified bacterial agents as the cause of diseases classified elsewhere: Secondary | ICD-10-CM | POA: Diagnosis not present

## 2019-07-27 DIAGNOSIS — G4700 Insomnia, unspecified: Secondary | ICD-10-CM | POA: Diagnosis present

## 2019-07-27 DIAGNOSIS — Z20828 Contact with and (suspected) exposure to other viral communicable diseases: Secondary | ICD-10-CM | POA: Diagnosis not present

## 2019-07-27 DIAGNOSIS — Z87891 Personal history of nicotine dependence: Secondary | ICD-10-CM | POA: Diagnosis not present

## 2019-07-27 DIAGNOSIS — N2581 Secondary hyperparathyroidism of renal origin: Secondary | ICD-10-CM | POA: Diagnosis not present

## 2019-07-27 DIAGNOSIS — I6300 Cerebral infarction due to thrombosis of unspecified precerebral artery: Secondary | ICD-10-CM

## 2019-07-27 DIAGNOSIS — M545 Low back pain: Secondary | ICD-10-CM | POA: Diagnosis present

## 2019-07-27 DIAGNOSIS — I63321 Cerebral infarction due to thrombosis of right anterior cerebral artery: Secondary | ICD-10-CM | POA: Diagnosis not present

## 2019-07-27 DIAGNOSIS — I5042 Chronic combined systolic (congestive) and diastolic (congestive) heart failure: Secondary | ICD-10-CM | POA: Diagnosis not present

## 2019-07-27 DIAGNOSIS — I63422 Cerebral infarction due to embolism of left anterior cerebral artery: Secondary | ICD-10-CM | POA: Diagnosis not present

## 2019-07-27 DIAGNOSIS — N898 Other specified noninflammatory disorders of vagina: Secondary | ICD-10-CM | POA: Diagnosis not present

## 2019-07-27 DIAGNOSIS — I63349 Cerebral infarction due to thrombosis of unspecified cerebellar artery: Secondary | ICD-10-CM | POA: Diagnosis not present

## 2019-07-27 DIAGNOSIS — R69 Illness, unspecified: Secondary | ICD-10-CM | POA: Diagnosis not present

## 2019-07-27 DIAGNOSIS — B952 Enterococcus as the cause of diseases classified elsewhere: Secondary | ICD-10-CM | POA: Diagnosis not present

## 2019-07-27 LAB — GLUCOSE, CAPILLARY
Glucose-Capillary: 119 mg/dL — ABNORMAL HIGH (ref 70–99)
Glucose-Capillary: 246 mg/dL — ABNORMAL HIGH (ref 70–99)
Glucose-Capillary: 261 mg/dL — ABNORMAL HIGH (ref 70–99)
Glucose-Capillary: 162 mg/dL — ABNORMAL HIGH (ref 70–99)

## 2019-07-27 LAB — MRSA PCR SCREENING: MRSA by PCR: NEGATIVE

## 2019-07-27 MED ORDER — ONDANSETRON HCL 4 MG PO TABS
4.0000 mg | ORAL_TABLET | Freq: Three times a day (TID) | ORAL | Status: DC | PRN
  Administered 2019-08-12: 22:00:00 4 mg via ORAL
  Filled 2019-07-27: qty 1

## 2019-07-27 MED ORDER — HEPARIN SODIUM (PORCINE) 1000 UNIT/ML DIALYSIS: 1000.0000 [IU] | INTRAMUSCULAR | Status: DC | PRN

## 2019-07-27 MED ORDER — CLOPIDOGREL BISULFATE 75 MG PO TABS
75.0000 mg | ORAL_TABLET | Freq: Every day | ORAL | Status: DC
  Administered 2019-07-28 – 2019-08-07 (×10): 75 mg via ORAL
  Filled 2019-07-27 (×11): qty 1

## 2019-07-27 MED ORDER — RENA-VITE PO TABS
1.0000 | ORAL_TABLET | Freq: Every day | ORAL | Status: DC
  Administered 2019-07-27 – 2019-08-23 (×27): 1 via ORAL
  Filled 2019-07-27 (×27): qty 1

## 2019-07-27 MED ORDER — DOXERCALCIFEROL 4 MCG/2ML IV SOLN: 1.0000 ug | INTRAVENOUS | Status: DC

## 2019-07-27 MED ORDER — ALPRAZOLAM 0.5 MG PO TABS
1.0000 mg | ORAL_TABLET | Freq: Every day | ORAL | Status: DC
  Administered 2019-07-27 – 2019-08-03 (×7): 1 mg via ORAL
  Filled 2019-07-27 (×8): qty 2

## 2019-07-27 MED ORDER — NEPRO/CARBSTEADY PO LIQD
237.0000 mL | Freq: Two times a day (BID) | ORAL | Status: DC
  Administered 2019-07-27: 237 mL via ORAL

## 2019-07-27 MED ORDER — MIDODRINE HCL 5 MG PO TABS
10.0000 mg | ORAL_TABLET | ORAL | Status: DC
  Administered 2019-07-29 – 2019-08-23 (×11): 10 mg via ORAL
  Filled 2019-07-27 (×9): qty 2

## 2019-07-27 MED ORDER — INSULIN ASPART 100 UNIT/ML ~~LOC~~ SOLN
0.0000 [IU] | Freq: Three times a day (TID) | SUBCUTANEOUS | Status: DC
  Administered 2019-07-28 (×2): 2 [IU] via SUBCUTANEOUS
  Administered 2019-07-28: 3 [IU] via SUBCUTANEOUS
  Administered 2019-07-29: 1 [IU] via SUBCUTANEOUS
  Administered 2019-07-29 – 2019-07-30 (×3): 2 [IU] via SUBCUTANEOUS
  Administered 2019-07-30: 1 [IU] via SUBCUTANEOUS
  Administered 2019-07-31: 7 [IU] via SUBCUTANEOUS
  Administered 2019-08-01: 2 [IU] via SUBCUTANEOUS
  Administered 2019-08-01: 3 [IU] via SUBCUTANEOUS
  Administered 2019-08-01: 1 [IU] via SUBCUTANEOUS
  Administered 2019-08-02 – 2019-08-03 (×2): 2 [IU] via SUBCUTANEOUS
  Administered 2019-08-03: 1 [IU] via SUBCUTANEOUS
  Administered 2019-08-03 (×3): 3 [IU] via SUBCUTANEOUS
  Administered 2019-08-04 (×2): 2 [IU] via SUBCUTANEOUS
  Administered 2019-08-04: 1 [IU] via SUBCUTANEOUS
  Administered 2019-08-05: 5 [IU] via SUBCUTANEOUS
  Administered 2019-08-06: 2 [IU] via SUBCUTANEOUS
  Administered 2019-08-06: 14:00:00 5 [IU] via SUBCUTANEOUS
  Administered 2019-08-06 – 2019-08-07 (×2): 1 [IU] via SUBCUTANEOUS
  Administered 2019-08-08: 12:00:00 2 [IU] via SUBCUTANEOUS
  Administered 2019-08-08: 5 [IU] via SUBCUTANEOUS
  Administered 2019-08-08 – 2019-08-09 (×2): 1 [IU] via SUBCUTANEOUS
  Administered 2019-08-09: 5 [IU] via SUBCUTANEOUS
  Administered 2019-08-09 – 2019-08-10 (×2): 2 [IU] via SUBCUTANEOUS
  Administered 2019-08-11: 3 [IU] via SUBCUTANEOUS
  Administered 2019-08-11: 2 [IU] via SUBCUTANEOUS
  Administered 2019-08-11 – 2019-08-12 (×2): 1 [IU] via SUBCUTANEOUS
  Administered 2019-08-12: 5 [IU] via SUBCUTANEOUS
  Administered 2019-08-13: 7 [IU] via SUBCUTANEOUS
  Administered 2019-08-14 (×2): 1 [IU] via SUBCUTANEOUS
  Administered 2019-08-14: 3 [IU] via SUBCUTANEOUS
  Administered 2019-08-15 – 2019-08-17 (×5): 1 [IU] via SUBCUTANEOUS
  Administered 2019-08-17: 5 [IU] via SUBCUTANEOUS
  Administered 2019-08-18 (×2): 2 [IU] via SUBCUTANEOUS
  Administered 2019-08-19: 1 [IU] via SUBCUTANEOUS
  Administered 2019-08-19 – 2019-08-20 (×2): 2 [IU] via SUBCUTANEOUS
  Administered 2019-08-20: 3 [IU] via SUBCUTANEOUS
  Administered 2019-08-21 – 2019-08-22 (×3): 1 [IU] via SUBCUTANEOUS
  Administered 2019-08-22: 2 [IU] via SUBCUTANEOUS
  Administered 2019-08-23 (×2): 1 [IU] via SUBCUTANEOUS
  Administered 2019-08-24: 09:00:00 3 [IU] via SUBCUTANEOUS

## 2019-07-27 MED ORDER — SEVELAMER CARBONATE 800 MG PO TABS
1600.0000 mg | ORAL_TABLET | Freq: Three times a day (TID) | ORAL | Status: DC
  Administered 2019-07-28 – 2019-08-19 (×59): 1600 mg via ORAL
  Filled 2019-07-27 (×64): qty 2

## 2019-07-27 MED ORDER — ACETAMINOPHEN 325 MG PO TABS
650.0000 mg | ORAL_TABLET | ORAL | Status: DC | PRN
  Administered 2019-07-27 – 2019-08-24 (×31): 650 mg via ORAL
  Filled 2019-07-27 (×35): qty 2

## 2019-07-27 MED ORDER — LUBIPROSTONE 24 MCG PO CAPS
24.0000 ug | ORAL_CAPSULE | Freq: Every day | ORAL | Status: DC
  Administered 2019-07-28 – 2019-07-31 (×4): 24 ug via ORAL
  Filled 2019-07-27 (×5): qty 1

## 2019-07-27 MED ORDER — ASPIRIN EC 81 MG PO TBEC
81.0000 mg | DELAYED_RELEASE_TABLET | Freq: Every day | ORAL | Status: DC
  Administered 2019-07-28 – 2019-08-07 (×10): 81 mg via ORAL
  Filled 2019-07-27 (×11): qty 1

## 2019-07-27 MED ORDER — ROSUVASTATIN CALCIUM 20 MG PO TABS
20.0000 mg | ORAL_TABLET | Freq: Every day | ORAL | Status: DC
  Administered 2019-07-28 – 2019-08-23 (×25): 20 mg via ORAL
  Filled 2019-07-27 (×26): qty 1

## 2019-07-27 MED ORDER — METOPROLOL TARTRATE 5 MG/5ML IV SOLN
2.5000 mg | Freq: Three times a day (TID) | INTRAVENOUS | Status: DC | PRN
  Administered 2019-07-27: 2.5 mg via INTRAVENOUS
  Filled 2019-07-27: qty 5

## 2019-07-27 MED ORDER — VITAMIN B-12 1000 MCG PO TABS
500.0000 ug | ORAL_TABLET | Freq: Every day | ORAL | Status: DC
  Administered 2019-07-28 – 2019-08-24 (×27): 500 ug via ORAL
  Filled 2019-07-27 (×28): qty 1

## 2019-07-27 MED ORDER — ACETAMINOPHEN 160 MG/5ML PO SOLN: 650.0000 mg | ORAL | Status: DC | PRN

## 2019-07-27 MED ORDER — FOLIC ACID 1 MG PO TABS
1.0000 mg | ORAL_TABLET | Freq: Every day | ORAL | Status: DC
  Administered 2019-07-28 – 2019-08-24 (×27): 1 mg via ORAL
  Filled 2019-07-27 (×28): qty 1

## 2019-07-27 MED ORDER — ACETAMINOPHEN 650 MG RE SUPP
650.0000 mg | RECTAL | Status: DC | PRN
  Administered 2019-08-04: 650 mg via RECTAL
  Filled 2019-07-27: qty 1

## 2019-07-27 MED ORDER — SORBITOL 70 % SOLN
30.0000 mL | Freq: Every day | Status: DC | PRN
  Administered 2019-08-12: 30 mL via ORAL
  Filled 2019-07-27: qty 30

## 2019-07-27 MED ORDER — HEPARIN SODIUM (PORCINE) 5000 UNIT/ML IJ SOLN
5000.0000 [IU] | Freq: Three times a day (TID) | INTRAMUSCULAR | Status: DC
  Administered 2019-07-27: 5000 [IU] via SUBCUTANEOUS
  Filled 2019-07-27: qty 1

## 2019-07-27 NOTE — Progress Notes (Addendum)
[facility] Progress Note   Dialysis Orders: MWF at [facility] 4hr, 300/600, EDW 51.5kg, 2K/2.25Ca, TDC, UFP #4, heparin 1000 - Mircera 200 q 2 weeks Right IJ TDC - failed AVF  Assessment/Plan: 1. Acute CVA: LACAdistribution, presumed embolic.Echo with reduced EF Mod AS ? thrombus vs vet - for TEE planned for 12/16. PersistentR hemiparesis, speech intact.Statin started. Planning for CIR admission 2. ESRD:Continue HD per MWF sched - HD12/11. Aiming toavoid intradialytic hypotension. No heparin. Spoke w/ Dr Posey Pronto , cath placed CK Vasc in September 2020. Regarding echo density attached to HD cath in R atrium, this is not unusual and would not require removal of cath.  3. Hypertension/volume:Permissive HTN initially ->BP variable net UF 1.5 Friday 12/11- post bed wt 52.9 4. Anemia:Hgb11.7 stable - no ESA needed - prev on high dose  5. Metabolic bone disease:Ca/Phos to goal. Continue home Sevelamer as binder.-un beknownst to her dialysis unit, she tells me she is taking home  calcitriol -  iPTH in goal - will d/c calcitriol and switch to TIW hectorol - home calcitriol should not be resumed at d/c 6. T2DM 7.  CAD (Hx CABG) 8. Nutrition - high risk for weight loss - intake marginal at present - nepro added   Myriam Jacobson, PA-C [facility] [phone] 07/27/2019,11:46 AM  LOS: 4 days   Pt seen, examined and agree w A/P as above.  Kelly Splinter  MD 07/27/2019, 12:32 PM    Subjective:   Needs to be fed - right handed  Objective Vitals:   07/27/19 0018 07/27/19 0418 07/27/19 0807 07/27/19 1123  BP: (!) 96/56 116/65 (!) 97/54 (!) 158/69  Pulse: 96 100 97 (!) 106  Resp: 16 16 15 17   Temp: 98.2 F (36.8 C) 98.5 F (36.9 C) 98.4 F (36.9 C) 99 F (37.2 C)  TempSrc: Oral Oral Oral Oral  SpO2: 98% 98% 100% 97%  Weight:       Physical Exam General: very frail, NAD Heart: RRR 3/6 murmur Lungs: no rales Abdomen: soft NT + BS Extremities:  no LE edema  Dialysis Access: right IJ [facility]  Neuro: right hemiplegia   Additional Objective Labs: Basic Metabolic Panel: Recent Labs  Lab 07/24/19 1250 07/26/19 1158 07/26/19 1427  NA 133* 130* 133*  K 5.4* 4.7 4.7  CL 96* 94* 94*  CO2 21* 23 25  GLUCOSE 173* 242* 192*  BUN 45* 58* 59*  CREATININE 6.52* 6.86* 6.68*  CALCIUM 9.8 9.1 9.4  PHOS 3.8 2.5 2.4*   Liver Function Tests: Recent Labs  Lab 07/23/19 0038 07/23/19 0420 07/24/19 1250 07/26/19 1158 07/26/19 1427  AST 28 15  --   --   --   ALT 20 11  --   --   --   ALKPHOS 98 52  --   --   --   BILITOT 0.9 0.2*  --   --   --   PROT 7.1 4.0*  --   --   --   ALBUMIN 3.2* 1.9* 3.1* 2.7* 2.8*   No results for input(s): LIPASE, AMYLASE in the last 168 hours. CBC: Recent Labs  Lab 07/23/19 0038 07/23/19 0420 07/24/19 0359 07/26/19 1158 07/26/19 1427  WBC 9.9 6.9 10.4 10.4 10.8*  NEUTROABS 7.5  --   --   --   --   HGB 12.6 7.8* 11.8* 11.7* 11.7*  HCT 40.8 26.0* 38.4 36.4 36.5  MCV 112.7* 115.0* 112.0* 108.3* 109.0*  PLT 125* 90* 128* 112* 120*  Blood Culture    Component Value Date/Time   SDES BLOOD LEFT ARM 07/26/2019 1729   SDES BLOOD RIGHT ARM 07/26/2019 1729   SPECREQUEST  07/26/2019 1729    BOTTLES DRAWN AEROBIC ONLY Blood Culture adequate volume   SPECREQUEST  07/26/2019 1729    BOTTLES DRAWN AEROBIC ONLY Blood Culture adequate volume   CULT  07/26/2019 1729    NO GROWTH < 12 HOURS Performed at [facility], [place] [address], [place], [place] [address]    CULT  07/26/2019 1729    NO GROWTH < 12 HOURS Performed at [facility] [address], [place], [place] [address]    REPTSTATUS PENDING 07/26/2019 1729   REPTSTATUS PENDING 07/26/2019 1729    Cardiac Enzymes: No results for input(s): CKTOTAL, CKMB, CKMBINDEX, TROPONINI in the last 168 hours. CBG: Recent Labs  Lab 07/26/19 1117 07/26/19 1702 07/26/19 2242 07/27/19 0642 07/27/19 1126  GLUCAP 216* 106* 145* 119* 246*    Iron Studies: No results for input(s): IRON, TIBC, TRANSFERRIN, FERRITIN in the last 72 hours. Lab Results  Component Value Date   INR 1.0 07/23/2019   INR 1.07 04/18/2018   INR 1.4 10/16/2008   Studies/Results: No results found. Medications:  . ALPRAZolam  1 mg Oral QHS  . aspirin EC  81 mg Oral Daily  . calcitRIOL  0.5 mcg Oral Daily  . Chlorhexidine Gluconate Cloth  6 each Topical Q0600  . clopidogrel  75 mg Oral Daily  . folic acid  1 mg Oral Daily  . insulin aspart  0-9 Units Subcutaneous TID WC  . lubiprostone  24 mcg Oral Q breakfast  . midodrine  10 mg Oral Q M,W,F-HD  . multivitamin  1 tablet Oral QHS  . rosuvastatin  20 mg Oral q1800  . sevelamer carbonate  1,600 mg Oral TID WC  . sodium chloride flush  3 mL Intravenous Once  . vitamin B-12  500 mcg Oral Daily

## 2019-07-27 NOTE — Discharge Summary (Addendum)
Discharge Summary  [name] [id number] DOB: October 11, 1940  PCP: [name], MD  Admit date: 07/23/2019 Discharge date: 07/27/2019  Time spent: 49mins, more than 50% time spent on coordination of care.  Patient is to discharge to inpatient rehab today, rehab MD to contact neurology stroke team to discuss TEE result.  Recommendations for Outpatient Follow-up after discharge from inpatient rehab:  1. F/u with PCP 2. F/u with neurology Dr Leonie Man  Discharge Diagnoses:  Active Hospital Problems   Diagnosis Date Noted  . Stroke ([place]) 09/26/2016  . Stage I pressure ulcer of sacral region 07/25/2019  . ESRD (end stage renal disease) on dialysis ([place]) 06/23/2015  . Essential hypertension 11/17/2008  . Diabetes mellitus type II, non insulin dependent ([place]) 11/12/2008    Resolved Hospital Problems  No resolved problems to display.    Discharge Condition: stable  Diet recommendation: renal /carb modified  Filed Weights   07/25/19 0351 07/26/19 1240 07/26/19 1615  Weight: 54.4 kg 54.3 kg 52.9 kg    History of present illness: (Per admitting MD Dr. Maudie Mercury) [name]  is a 78 y.o. female,  w hypertension, hyperlipidemia, Dm2, w neuropathy, ESRD on HD MWF, CAD, Aortic stenosis, h/o stroke apparently presents with c/o right arm and leg numbness, weakness  In ED,  T 98 P 82 R 18, Bp 171/101  Pox 99% on RA Wt 51.9, kg  CT brain , CTA brain/ neck IMPRESSION: 1. No intracranial hemorrhage. 2. 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 3. ASPECTS is 10.  CTA brain/ neck IMPRESSION: 1. No emergent large vessel occlusion. 2. 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 3. 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 4. Bilateral carotid calcific atherosclerosis with 50% stenosis of the proximal right ICA. 5. Aortic Atherosclerosis (ICD10-I70.0).  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  -CT angiogram negative for large vessel occlusion,however positive for significant intracranial and extracranial atherosclerotic disease. 1. No emergent large vessel occlusion. 2. 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 3. 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 4. Bilateral carotid calcific atherosclerosis with 50% stenosis of the proximal right ICA. 5. Aortic Atherosclerosis (ICD10-I70.0). -LDL 16, A1c 5.3 -Was previously not on any antithrombotic biotic prior to admission due to history of GI bleed, she is now agreeable to aspirin enteric coating 81 mg daily with Plavix for 3 weeks then aspirin 81 mg alone per neurology recommendation -CIR placement   Echo cardiogram showed "catheter in right atrium with oscillating density (consider vegetation vs thrombus)." Case discussed with neurology Dr. Leonie Man who recommended TEE, case discussed with cardiology , TEE scheduled on 12/16. Case discussed with nephrology Dr Jonnie Finner who ordered blood culture on 12/11 , no growth to date, per nephrology note from 12/12 "Spoke w/ Dr Posey Pronto ,  cath placed CK Vasc in September 2020. Regarding echo density attached to HD cath in R atrium, this is not unusual and would not require removal of cath. " Per CIR MD, CIR can coordinate TEE while patient staying at rehab,  Patient is to  discharge to rehab today, Rehab MD please contact neurology to discuss TEE result.   History of GI bleed in March 2018 required PRBC transfusion in the past -Per chart review she does not desire any GI work-up - H&H has been stable here, continue monitor  ESRD on hemodialysis Monday Wednesday Friday, plan per nephrology Anemia of chronic disease    H/o NSVT -had7beats of NSVT on tele during dialysis on 12/9 pm -Blood pressure is too low for betablocker or ccb  she has sinus tachycardia on telemetry, she denies symptom, she denies feeling dizzy, no chest pain, no sob, no hypoxia. 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 Carlis Abbott  no surgical intervention needed as long as patient remains asymptomatic F/u with Dr Carlis Abbott as needed   Stage I decubitus ulcer sacral present on presentation, pressure offloading measures  FTT: very frail  DVT Prophylaxis:scd's, subQ heparin  Code Status: Full  Family Communication: patient , husband over the phone on 12/9 and December 11, husband at bedside on 12/12,  talked to son over the phone todayx2 , son prefers to be contacted around lunch time daily   Disposition Plan: to inpatient rehab, need to have TEE on 2/16, per rehab MD Dr. Newt Lukes can coordinate with cardiology to get TEE done while patient is staying at the inpatient rehab   Consultants:  Neurology  Nephrology  cardiology  CIR  Vascular surgery Dr. Carlis Abbott  over the phone  Procedures:  Dialysis Monday Wednesday Friday  TEE scheduled on 2/16  Antibiotics:  None   Discharge Exam: BP (!) 158/69 (BP Location: Left Arm)   Pulse (!) 106   Temp 99 F (37.2 C) (Oral)   Resp 17   Wt 52.9 kg   SpO2 97%   BMI 17.73 kg/m   General: very frail, but NAD, aaox3, cooperative Cardiovascular: RRR Respiratory: CTABL  Discharge Instructions You were cared for by a hospitalist during your hospital stay. If you have any questions about your discharge medications or the care you received while you were in the hospital after you are discharged, you can call the unit and asked to speak with the hospitalist on call if the hospitalist that took care of you is not available. Once you are discharged, your primary care physician will handle any further medical issues. Please note that NO REFILLS for any discharge medications will be authorized once you are discharged, as it is imperative that you return to your primary care physician (or establish a relationship with a primary care physician if you do not have one) for your aftercare needs so that they can reassess your need for medications and monitor your lab values.  Discharge Instructions    Ambulatory referral to Neurology  Complete by: As directed    An appointment is requested in approximately: 6 weeks, with Dr Leonie Man   Diet Carb Modified   Complete by: As directed    Dialysis diet and carb modified   Increase activity slowly   Complete by: As directed      Allergies as of 07/27/2019      Reactions   Doxycycline Nausea And Vomiting   Caused "[place]"   Lipitor [atorvastatin] Other (See Comments)   Stomach pain   Strawberry Extract Rash      Medication List    TAKE these medications   acetaminophen 500 MG tablet Commonly known as: TYLENOL Take 1,000 mg by mouth 3 (three) times daily as needed for moderate pain.   ALPRAZolam 1 MG tablet Commonly known as:  XANAX Take 1 mg by mouth at bedtime.   Amitiza 24 MCG capsule Generic drug: lubiprostone Take 24 mcg by mouth 2 (two) times daily as needed for constipation.   aspirin 81 MG EC tablet Take 1 tablet (81 mg total) by mouth daily.   b complex-vitamin c-folic acid 0.8 MG Tabs tablet Take 1 tablet by mouth daily.   calcitRIOL 0.5 MCG capsule Commonly known as: ROCALTROL Take 0.5 mcg by mouth daily.   clopidogrel 75 MG tablet Commonly known as: PLAVIX Take 1 tablet (75 mg total) by mouth daily for 17 days.   diphenhydrAMINE 25 MG tablet Commonly known as: BENADRYL Take 25 mg by mouth daily as needed for allergies.   folic acid 1 MG tablet Commonly known as: FOLVITE Take 1 tablet (1 mg total) by mouth daily.   gabapentin 100 MG capsule Commonly known as: NEURONTIN Take 100 mg by mouth at bedtime.   insulin aspart 100 UNIT/ML injection Commonly known as: novoLOG Inject 0-9 Units into the skin 3 (three) times daily with meals.   midodrine 10 MG tablet Commonly known as: PROAMATINE Take 10 mg by mouth 3 (three) times a week. Patient takes on dialysis days, M,W,F   rosuvastatin 5 MG tablet Commonly known as: CRESTOR TAKE 1 TABLET BY MOUTH EVERYDAY AT BEDTIME What changed: See the new instructions.   sevelamer carbonate 800 MG tablet Commonly known as: RENVELA Take 1,600 mg 3 (three) times daily with meals by mouth.   vitamin B-12 500 MCG tablet Commonly known as: CYANOCOBALAMIN Take 1 tablet (500 mcg total) by mouth daily.      Allergies  Allergen Reactions  . Doxycycline Nausea And Vomiting    Caused "DEATHLY NAUSEA AND VOMITING"  . Lipitor [Atorvastatin] Other (See Comments)    Stomach pain  . Strawberry Extract Rash   Follow-up Information    [name], MD Follow up.   Specialty: Family Medicine Contact information: [address] [place] [address] [phone]        Larey Dresser, MD .   Specialty: Cardiology Contact  information: [phone] [address] [place] [address] [phone]        Garvin Fila, MD Follow up in 4 week(s).   Specialties: Neurology, Radiology Why: stroke follow up Contact information: [address] [place] [address] [phone]        continue dialysis MWF Follow up.        Marty Heck, MD Follow up.   Specialty: Vascular Surgery Why: as needed for left subclavian artery stenosis Contact information: [address] [place] [address] [phone]  The results of significant diagnostics from this hospitalization (including imaging, microbiology, ancillary and laboratory) are listed below for reference.    Significant Diagnostic Studies: CT ANGIO HEAD W OR WO CONTRAST  Result Date: 07/23/2019 CLINICAL DATA:  Right-sided paralysis EXAM: CT ANGIOGRAPHY HEAD AND NECK TECHNIQUE: Multidetector CT imaging of the head and neck was performed using the standard protocol during bolus administration of intravenous contrast. Multiplanar CT image reconstructions and MIPs were obtained to evaluate the vascular anatomy. Carotid stenosis measurements (when applicable) are obtained utilizing NASCET criteria, using the distal internal carotid diameter as the denominator. CONTRAST:  37mL OMNIPAQUE IOHEXOL 350 MG/ML SOLN COMPARISON:  Head CT same day FINDINGS: CTA NECK FINDINGS SKELETON: There is no bony spinal canal stenosis. No lytic or blastic lesion. OTHER NECK: Normal pharynx, larynx and major salivary glands. No cervical lymphadenopathy. Unremarkable thyroid gland. UPPER CHEST: No pneumothorax or pleural effusion. No nodules or masses. AORTIC ARCH: There is moderate calcific atherosclerosis of the aortic arch. There is no aneurysm, dissection or hemodynamically significant stenosis of the visualized portion of the aorta. Conventional 3 vessel aortic branching pattern. 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 RIGHT  CAROTID SYSTEM: There is atherosclerotic calcification within the right common carotid artery. There is bulky calcific atherosclerosis at the carotid bifurcation extending into the proximal right ICA resulting in approximately 50% stenosis. LEFT CAROTID SYSTEM: There is atherosclerotic calcification within the common carotid artery and at the carotid bifurcation. There is no flow-limiting stenosis. VERTEBRAL ARTERIES: Left dominant configuration. Both origins are clearly patent. There is multifocal calcification within both vertebral arteries which remain patent to the vertebrobasilar confluence. CTA HEAD FINDINGS POSTERIOR CIRCULATION: --Vertebral arteries: Atherosclerotic calcification within the left vertebral artery without stenosis. The right vertebral artery terminates in PICA. --Posterior inferior cerebellar arteries (PICA): Patent origins from the vertebral arteries. --Anterior inferior cerebellar arteries (AICA): Patent origins from the basilar artery. 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 --Superior cerebellar arteries: Normal. --Posterior cerebral arteries: Normal. Both originate from the basilar artery. Posterior communicating arteries (p-comm) are diminutive or absent. ANTERIOR CIRCULATION: --Intracranial internal carotid arteries: Atherosclerotic calcification of the internal carotid arteries at the skull base without hemodynamically significant stenosis. 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 Patent distal branches. Right MCA is normal. VENOUS SINUSES: As permitted by contrast timing, patent. ANATOMIC VARIANTS: None Review of the MIP images confirms the above findings. IMPRESSION: 1. No emergent large vessel occlusion. 2. 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 3. Severe stenosis of the proximal left subclavian  artery. 4. Bilateral carotid calcific atherosclerosis with 50% stenosis of the proximal right ICA. 5. Aortic Atherosclerosis (ICD10-I70.0). Electronically Signed   By: Ulyses Jarred M.D.   On: 07/23/2019 01:26   DG Chest 2 View  Result Date: 07/23/2019 CLINICAL DATA:  Facial droop and right-sided paralysis. EXAM: CHEST - 2 VIEW COMPARISON:  Radiograph 05/29/2018 FINDINGS: Right dialysis catheter tip at the atrial caval junction. Post median sternotomy and CABG. Decreased cardiomegaly from prior exam. Aortic atherosclerosis and tortuosity. Bilateral pleural effusions have resolved. Improved interstitial thickening. Mild underlying interstitial coarsening which may be chronic. No focal airspace disease. No pleural fluid or pneumothorax. No acute osseous abnormalities are seen. IMPRESSION: 1. Mild interstitial coarsening, favor this is chronic rather than pulmonary edema. 2. No other acute findings. 3. Dialysis catheter tip at the atrial caval junction. Electronically Signed   By: Threasa Beards  [place] M.D.   On: 07/23/2019 02:45   CT ANGIO NECK W OR WO CONTRAST  Result Date: 07/23/2019 CLINICAL DATA:  Right-sided paralysis EXAM: CT ANGIOGRAPHY HEAD AND NECK TECHNIQUE: Multidetector CT imaging of the head and neck was performed using the standard protocol during bolus administration of intravenous contrast. Multiplanar CT image reconstructions and MIPs were obtained to evaluate the vascular anatomy. Carotid stenosis measurements (when applicable) are obtained utilizing NASCET criteria, using the distal internal carotid diameter as the denominator. CONTRAST:  23mL OMNIPAQUE IOHEXOL 350 MG/ML SOLN COMPARISON:  Head CT same day FINDINGS: CTA NECK FINDINGS SKELETON: There is no bony spinal canal stenosis. No lytic or blastic lesion. OTHER NECK: Normal pharynx, larynx and major salivary glands. No cervical lymphadenopathy. Unremarkable thyroid gland. UPPER CHEST: No pneumothorax or pleural effusion. No nodules or masses.  AORTIC ARCH: There is moderate calcific atherosclerosis of the aortic arch. There is no aneurysm, dissection or hemodynamically significant stenosis of the visualized portion of the aorta. Conventional 3 vessel aortic branching pattern. 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 RIGHT CAROTID SYSTEM: There is atherosclerotic calcification within the right common carotid artery. There is bulky calcific atherosclerosis at the carotid bifurcation extending into the proximal right ICA resulting in approximately 50% stenosis. LEFT CAROTID SYSTEM: There is atherosclerotic calcification within the common carotid artery and at the carotid bifurcation. There is no flow-limiting stenosis. VERTEBRAL ARTERIES: Left dominant configuration. Both origins are clearly patent. There is multifocal calcification within both vertebral arteries which remain patent to the vertebrobasilar confluence. CTA HEAD FINDINGS POSTERIOR CIRCULATION: --Vertebral arteries: Atherosclerotic calcification within the left vertebral artery without stenosis. The right vertebral artery terminates in PICA. --Posterior inferior cerebellar arteries (PICA): Patent origins from the vertebral arteries. --Anterior inferior cerebellar arteries (AICA): Patent origins from the basilar artery. 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 --Superior cerebellar arteries: Normal. --Posterior cerebral arteries: Normal. Both originate from the basilar artery. Posterior communicating arteries (p-comm) are diminutive or absent. ANTERIOR CIRCULATION: --Intracranial internal carotid arteries: Atherosclerotic calcification of the internal carotid arteries at the skull base without hemodynamically significant stenosis. 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 Patent distal branches. Right MCA is normal. VENOUS SINUSES: As  permitted by contrast timing, patent. ANATOMIC VARIANTS: None Review of the MIP images confirms the above findings. IMPRESSION: 1. No emergent large vessel occlusion. 2. 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 3. 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 4. Bilateral carotid calcific atherosclerosis with 50% stenosis of the proximal right ICA. 5. Aortic Atherosclerosis (ICD10-I70.0). Electronically Signed   By: Ulyses Jarred M.D.   On: 07/23/2019 01:26   MR BRAIN WO CONTRAST  Result Date: 07/23/2019 CLINICAL DATA:  Hypertension. Stroke. Right arm and leg numbness in weakness. EXAM: MRI HEAD WITHOUT CONTRAST TECHNIQUE: Multiplanar, multiecho pulse sequences of the brain and surrounding structures were obtained without intravenous contrast. COMPARISON:  CT studies earlier same day FINDINGS: Brain: Diffusion imaging shows acute infarction in the distal left anterior cerebral artery territory. No evidence of mass effect or hemorrhage. No other vascular territory insult. Elsewhere, there chronic small-vessel ischemic changes throughout the pons. No focal cerebellar insult. Cerebral hemispheres show chronic small-vessel ischemic changes of the thalami, basal ganglia and hemispheric deep white matter. No hydrocephalus or extra-axial collection. Vascular: Major vessels at the base of the brain show flow. Skull and upper cervical spine: Negative Sinuses/Orbits: Clear/normal  Other: None IMPRESSION: Areas of acute infarction throughout the distal left anterior cerebral artery territory. Mild swelling but no mass effect or hemorrhage. Chronic small-vessel ischemic changes elsewhere throughout the brain as outlined above. Electronically Signed   By: Nelson Chimes M.D.   On: 07/23/2019 09:45   ECHOCARDIOGRAM COMPLETE  Result Date: 07/25/2019   ECHOCARDIOGRAM REPORT   Patient Name:   [name] Date of Exam: 07/23/2019 Medical Rec #:  [id number]     Height:       68.0 in  Accession #:    [id number]    Weight:       114.4 lb Date of Birth:  1940/11/04     BSA:          1.61 m Patient Age:    89 years      BP:           157/64 mmHg Patient Gender: F             HR:           88 bpm. Exam Location:  Inpatient Procedure: 2D Echo Indications:    TIA 435.9/G45.9  History:        Patient has prior history of Echocardiogram examinations, most                 recent 05/16/2018. CHF; Risk Factors:Diabetes and Hypertension.                 ESRD. AS.  Sonographer:    Clayton Lefort RDCS (AE) Referring Phys: [place]  Sonographer Comments: Limited mobility. Patient supine. IMPRESSIONS  1. Left ventricular ejection fraction, by visual estimation, is 25 to 30%. The left ventricle has severely decreased function. There is mildly increased left ventricular hypertrophy.  2. Left ventricular diastolic parameters are consistent with Grade I diastolic dysfunction (impaired relaxation).  3. The left ventricle demonstrates global hypokinesis.  4. Global right ventricle has normal systolic function.The right ventricular size is normal.  5. Left atrial size was normal.  6. Right atrial size was normal.  7. Mild mitral annular calcification.  8. The mitral valve is normal in structure. Mild mitral valve regurgitation. No evidence of mitral stenosis.  9. The tricuspid valve is normal in structure. Tricuspid valve regurgitation is mild. 10. The aortic valve is normal in structure. Aortic valve regurgitation is not visualized. Moderate to severe aortic valve stenosis. 11. The pulmonic valve was not well visualized. Pulmonic valve regurgitation is trivial. 12. The inferior vena cava is normal in size with greater than 50% respiratory variability, suggesting right atrial pressure of 3 mmHg. 13. Severe global reduction in LV systolic function; grade 1 diastolic dysfunction; mild LVH; calcified aortic valve with moderate to severe AS by continuity equation (mean gradient 10 mmHg; AVA 1 cm2); mild MR; catheter in  right atrium with oscillating density (consider vegetation vs thrombus). FINDINGS  Left Ventricle: Left ventricular ejection fraction, by visual estimation, is 25 to 30%. The left ventricle has severely decreased function. The left ventricle demonstrates global hypokinesis. There is mildly increased left ventricular hypertrophy. Left ventricular diastolic parameters are consistent with Grade I diastolic dysfunction (impaired relaxation). Normal left atrial pressure. Right Ventricle: The right ventricular size is normal. Global RV systolic function is has normal systolic function. The tricuspid regurgitant velocity is 2.40 m/s, and with an assumed right atrial pressure of 3 mmHg, the estimated right ventricular systolic pressure is normal at 26.0 mmHg. Left Atrium: Left atrial size was normal in size.  Right Atrium: Right atrial size was normal in size Pericardium: There is no evidence of pericardial effusion. Mitral Valve: The mitral valve is normal in structure. There is mild thickening of the mitral valve leaflet(s). Mild mitral annular calcification. No evidence of mitral valve stenosis by observation. MV peak gradient, 8.8 mmHg. Mild mitral valve regurgitation. Tricuspid Valve: The tricuspid valve is normal in structure. Tricuspid valve regurgitation is mild. Aortic Valve: The aortic valve is normal in structure. Aortic valve regurgitation is not visualized. Moderate to severe aortic stenosis is present. Aortic valve mean gradient measures 9.8 mmHg. Aortic valve peak gradient measures 17.0 mmHg. Aortic valve area, by VTI measures 1.02 cm. Pulmonic Valve: The pulmonic valve was not well visualized. Pulmonic valve regurgitation is trivial. Aorta: The aortic root is normal in size and structure. Venous: The inferior vena cava is normal in size with greater than 50% respiratory variability, suggesting right atrial pressure of 3 mmHg.  Additional Comments: Severe global reduction in LV systolic function; grade 1  diastolic dysfunction; mild LVH; calcified aortic valve with moderate to severe AS by continuity equation (mean gradient 10 mmHg; AVA 1 cm2); mild MR; catheter in right atrium with oscillating density (consider vegetation vs thrombus).  LEFT VENTRICLE PLAX 2D LVIDd:         4.30 cm       Diastology LVIDs:         3.80 cm       LV e' lateral: 7.07 cm/s LV PW:         1.10 cm       LV e' medial:  4.24 cm/s LV IVS:        1.20 cm LVOT diam:     2.00 cm LV SV:         21 ml LV SV Index:   13.50 LVOT Area:     3.14 cm  LV Volumes (MOD) LV area d, A2C:    30.00 cm LV area d, A4C:    26.90 cm LV area s, A2C:    22.50 cm LV area s, A4C:    22.10 cm LV major d, A2C:   6.98 cm LV major d, A4C:   6.97 cm LV major s, A2C:   6.32 cm LV major s, A4C:   6.39 cm LV vol d, MOD A2C: 108.0 ml LV vol d, MOD A4C: 84.7 ml LV vol s, MOD A2C: 68.0 ml LV vol s, MOD A4C: 63.1 ml LV SV MOD A2C:     40.0 ml LV SV MOD A4C:     84.7 ml LV SV MOD BP:      29.5 ml RIGHT VENTRICLE            IVC RV Basal diam:  2.80 cm    IVC diam: 0.80 cm RV S prime:     9.68 cm/s TAPSE (M-mode): 1.5 cm LEFT ATRIUM             Index       RIGHT ATRIUM           Index LA diam:        2.70 cm 1.67 cm/m  RA Area:     12.20 cm LA Vol (A2C):   33.2 ml 20.60 ml/m RA Volume:   27.70 ml  17.18 ml/m LA Vol (A4C):   40.5 ml 25.12 ml/m LA Biplane Vol: 38.1 ml 23.64 ml/m  AORTIC VALVE AV Area (Vmax):    1.05 cm AV Area (Vmean):   1.00  cm AV Area (VTI):     1.02 cm AV Vmax:           206.40 cm/s AV Vmean:          145.000 cm/s AV VTI:            0.404 m AV Peak Grad:      17.0 mmHg AV Mean Grad:      9.8 mmHg LVOT Vmax:         69.30 cm/s LVOT Vmean:        46.200 cm/s LVOT VTI:          0.132 m LVOT/AV VTI ratio: 0.33  AORTA Ao Root diam: 2.80 cm MITRAL VALVE                 TRICUSPID VALVE MV Peak grad: 8.8 mmHg       TR Peak grad:   23.0 mmHg MV Mean grad: 2.0 mmHg       TR Vmax:        265.00 cm/s MV Vmax:      1.48 m/s MV Vmean:     67.9 cm/s      SHUNTS MV  VTI:       0.32 m         Systemic VTI:  0.13 m MR Peak grad:    111.5 mmHg  Systemic Diam: 2.00 cm MR Mean grad:    76.0 mmHg MR Vmax:         528.00 cm/s MR Vmean:        413.0 cm/s MR PISA:         1.01 cm MR PISA Eff ROA: 5 mm MR PISA Radius:  0.40 cm  Kirk Ruths MD Electronically signed by Kirk Ruths MD Signature Date/Time: 07/25/2019/3:00:28 PM    Final    CT HEAD CODE STROKE WO CONTRAST  Result Date: 07/23/2019 CLINICAL DATA:  Code stroke. Right-sided facial paralysis. Last seen normal 6 p.m. EXAM: CT HEAD WITHOUT CONTRAST TECHNIQUE: Contiguous axial images were obtained from the base of the skull through the vertex without intravenous contrast. COMPARISON:  Brain MRI 09/26/2016 FINDINGS: Brain: There is no mass, hemorrhage or extra-axial collection. There is generalized atrophy without lobar predilection. There is hypoattenuation of the periventricular white matter, most commonly indicating chronic ischemic microangiopathy. 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 Vascular: No abnormal hyperdensity of the major intracranial arteries or dural venous sinuses. No intracranial atherosclerosis. Skull: The visualized skull base, calvarium and extracranial soft tissues are normal. Sinuses/Orbits: No fluid levels or advanced mucosal thickening of the visualized paranasal sinuses. No mastoid or middle ear effusion. The orbits are normal. ASPECTS [facility] Stroke Program Early CT Score) - Ganglionic level infarction (caudate, lentiform nuclei, internal capsule, insula, M1-M3 cortex): 7 - Supraganglionic infarction (M4-M6 cortex): 3 Total score (0-10 with 10 being normal): 10 IMPRESSION: 1. No intracranial hemorrhage. 2. 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 3. ASPECTS is 10. 4. These results were communicated to Dr. Karena Addison Aroor at 12:57 am on 07/23/2019 by text page via the [facility] messaging system. Electronically Signed   By: Ulyses Jarred M.D.    On: 07/23/2019 00:57    Microbiology: Recent Results (from the past 240 hour(s))  SARS CORONAVIRUS 2 (TAT 6-24 HRS) Nasopharyngeal Nasopharyngeal Swab     Status: None   Collection Time: 07/23/19  2:02 AM   Specimen: Nasopharyngeal Swab  Result Value Ref Range Status   SARS  Coronavirus 2 NEGATIVE NEGATIVE Final    Comment: (NOTE) SARS-CoV-2 target nucleic acids are NOT DETECTED. The SARS-CoV-2 RNA is generally detectable in upper and lower respiratory specimens during the acute phase of infection. Negative results do not preclude SARS-CoV-2 infection, do not rule out co-infections with other pathogens, and should not be used as the sole basis for treatment or other patient management decisions. Negative results must be combined with clinical observations, patient history, and epidemiological information. The expected result is Negative. Fact Sheet for Patients: SugarRoll.be Fact Sheet for Healthcare Providers: https://www.woods-mathews.com/ This test is not yet approved or cleared by the [place] FDA and  has been authorized for detection and/or diagnosis of SARS-CoV-2 by FDA under an Emergency Use Authorization (EUA). This EUA will remain  in effect (meaning this test can be used) for the duration of the COVID-19 declaration under Section 56 4(b)(1) of the Act, 21 U.S.C. section 360bbb-3(b)(1), unless the authorization is terminated or revoked sooner. Performed at [facility], [place] [address] [place], [place] [address]   Culture, blood (routine x 2)     Status: None (Preliminary result)   Collection Time: 07/26/19  5:29 PM   Specimen: BLOOD LEFT ARM  Result Value Ref Range Status   Specimen Description BLOOD LEFT ARM  Final   Special Requests   Final    BOTTLES DRAWN AEROBIC ONLY Blood Culture adequate volume   Culture   Final    NO GROWTH < 12 HOURS Performed at [facility], [place] [address], [place], [place]  [address]    Report Status PENDING  Incomplete  Culture, blood (routine x 2)     Status: None (Preliminary result)   Collection Time: 07/26/19  5:29 PM   Specimen: BLOOD RIGHT ARM  Result Value Ref Range Status   Specimen Description BLOOD RIGHT ARM  Final   Special Requests   Final    BOTTLES DRAWN AEROBIC ONLY Blood Culture adequate volume   Culture   Final    NO GROWTH < 12 HOURS Performed at [facility], [place] [address] [place] [address], [place], [place] [address]    Report Status PENDING  Incomplete  MRSA PCR Screening     Status: None   Collection Time: 07/27/19 10:44 AM   Specimen: Nasopharyngeal  Result Value Ref Range Status   MRSA by PCR NEGATIVE NEGATIVE Final    Comment:        The GeneXpert MRSA Assay (FDA approved for NASAL specimens only), is one component of a comprehensive MRSA colonization surveillance program. It is not intended to diagnose MRSA infection nor to guide or monitor treatment for MRSA infections. Performed at [facility], [place] [address] [place],  [address]      Labs: Basic Metabolic Panel: Recent Labs  Lab 07/23/19 0038 07/23/19 0051 07/23/19 0420 07/24/19 1250 07/26/19 1158 07/26/19 1427  NA 136 135 139 133* 130* 133*  K 4.5 4.4 2.4* 5.4* 4.7 4.7  CL 97* 100 114* 96* 94* 94*  CO2 25  --  19* 21* 23 25  GLUCOSE 156* 153* 100* 173* 242* 192*  BUN 22 23 18  45* 58* 59*  CREATININE 4.26* 4.20* 2.92* 6.52* 6.86* 6.68*  CALCIUM 9.1  --  5.7* 9.8 9.1 9.4  MG  --   --  1.4*  --   --   --   PHOS  --   --   --  3.8 2.5 2.4*   Liver Function Tests: Recent  Labs  Lab 07/23/19 0038 07/23/19 0420 07/24/19 1250 07/26/19 1158 07/26/19 1427  AST 28 15  --   --   --   ALT 20 11  --   --   --   ALKPHOS 98 52  --   --   --   BILITOT 0.9 0.2*  --   --   --   PROT 7.1 4.0*  --   --   --   ALBUMIN 3.2* 1.9* 3.1* 2.7* 2.8*   No results for input(s): LIPASE, AMYLASE in the last 168 hours. No results for input(s): AMMONIA in the last 168  hours. CBC: Recent Labs  Lab 07/23/19 0038 07/23/19 0051 07/23/19 0420 07/24/19 0359 07/26/19 1158 07/26/19 1427  WBC 9.9  --  6.9 10.4 10.4 10.8*  NEUTROABS 7.5  --   --   --   --   --   HGB 12.6 13.9 7.8* 11.8* 11.7* 11.7*  HCT 40.8 41.0 26.0* 38.4 36.4 36.5  MCV 112.7*  --  115.0* 112.0* 108.3* 109.0*  PLT 125*  --  90* 128* 112* 120*   Cardiac Enzymes: No results for input(s): CKTOTAL, CKMB, CKMBINDEX, TROPONINI in the last 168 hours. BNP: BNP (last 3 results) No results for input(s): BNP in the last 8760 hours.  ProBNP (last 3 results) No results for input(s): PROBNP in the last 8760 hours.  CBG: Recent Labs  Lab 07/26/19 1117 07/26/19 1702 07/26/19 2242 07/27/19 0642 07/27/19 1126  GLUCAP 216* 106* 145* 119* 246*       Signed:  Florencia Reasons MD, PhD, FACP  [facility] 07/27/2019, 3:57 PM

## 2019-07-27 NOTE — Progress Notes (Signed)
Patient arrived from [facility] via bed, assigned to [id number], [facility]. Patient appears alert and makes no complaint of pain.

## 2019-07-27 NOTE — H&P (Signed)
Physical Medicine and Rehabilitation Admission H&P        Chief Complaint  Patient presents with  . Code Stroke  : HPI: [name] is a 78 year old right-handed female with history of hypertension, hyperlipidemia, diabetes mellitus, history of GI bleed, IBS, congestive diastolic heart failure, end-stage renal disease with hemodialysis Monday Wednesday and Fridays, CAD/CABG 2010, aortic stenosis, history of right thalamic CVA 2018, tobacco abuse.  Per chart review patient lives with spouse.  1 level home.  Patient did use a Rollator when going to dialysis.  Presented 07/23/2019 with right-sided weakness.  Cranial CT scan showed no intracranial hemorrhage.  Chronic small vessel ischemia and generalized volume loss noted.  Patient did not receive TPA.  CT angiogram negative for large vessel occlusion however positive for significant intracranial and extracranial atherosclerotic disease.  MRI of the brain areas of acute infarction throughout the distal left anterior cerebral artery territory.  Echocardiogram with ejection fraction of 30% without emboli however it did show catheter at right atrium with oscillating density considering vegetation versus thrombus discussed with neurology services awaiting plan for TEE...  Admission chemistries with creatinine 4.26, SARS coronavirus negative, hemoglobin 12.6, platelet count 125,000.  Neurology follow-up maintained on aspirin and Plavix for CVA prophylaxis x3 weeks then aspirin alone.  Hemodialysis ongoing as per renal services.  Therapy evaluations completed and patient was admitted for a comprehensive rehab program.   Review of Systems  Constitutional: Negative for chills and fever.  HENT: Negative for hearing loss.   Eyes: Negative for blurred vision and double vision.  Respiratory: Negative for cough and shortness of breath.   Cardiovascular: Positive for leg swelling. Negative for chest pain and palpitations.  Gastrointestinal: Positive for  constipation. Negative for heartburn and nausea.       GERD  Genitourinary: Positive for dysuria. Negative for flank pain and hematuria.  Musculoskeletal: Positive for back pain and myalgias.       She did have a fall in the home approximately 6 months ago.  Skin: Negative for rash.  Neurological: Positive for weakness.  Psychiatric/Behavioral: The patient has insomnia.        Anxiety  All other systems reviewed and are negative.       Past Medical History:  Diagnosis Date  . Anemia      Pt is taking iron.   [name] Anxiety    . Arthritis    . Carotid stenosis      40-59% bilateral ICA stenosis in 2/12.  . Chronic low back pain    . CKD (chronic kidney disease)      Dr. Audie Clear at [facility]  . Coronary artery disease      Pt presented 2/10 to [facility] with NSTEMI and diastolic CHF exacerbation.  LHC was done  3/10 showing 99% pRCA stenosis and 80% calcified pLAD stenosis with L=>R collaterals.  Pt was referred  for CABG which was done by Dr. Prescott Gum with LIMA-LAD, SVG-RCA, SVG-OM.  . Diabetes mellitus    . Diabetic neuropathy ([place])    . Diastolic CHF (HCC)      Echo (2/10) showed EF 55-65%, mild LVH, diastolic dysfunction, mild AS with mean gradient 12 mmHg, PASP 43 mmHg.  Echo (2/12): EF 55-60%, mild LVH, mild AS (mean gradient 12), PA systolic pressure 32 mmHg.     [name] GERD (gastroesophageal reflux disease)    . Heart murmur    . Hyperlipidemia    . Hypertension    .  Mild aortic stenosis      mean gradient 12 mmHg in 2/12.  . Myocardial infarction ([place])      "mild"  . Pneumonia    . PONV (postoperative nausea and vomiting)    . Stroke [facility])      " mild"  . Thrombocytopenia ([place])    . Unsteady gait           Past Surgical History:  Procedure Laterality Date  . AORTIC ARCH ANGIOGRAPHY N/A 10/18/2018    Procedure: AORTIC ARCH ANGIOGRAPHY;  Surgeon: Marty Heck, MD;  Location: [place] [facility];  Service: Cardiovascular;  Laterality: N/A;  . AV FISTULA  PLACEMENT Left 01/30/2015    Procedure: Creation of a Radial Cephalic AV Fistula left wrist;  Surgeon: [name], MD;  Location: [place];  Service: Vascular;  Laterality: Left;  . AV FISTULA PLACEMENT Left 09/06/2018    Procedure: LEFT ARM ARTERIOVENOUS (AV) FISTULA CREATION;  Surgeon: Marty Heck, MD;  Location: [place];  Service: Vascular;  Laterality: Left;  . BACK SURGERY        multiple  . BREAST SURGERY        biopsy  . CARDIAC CATHETERIZATION      . CATARACT EXTRACTION W/ INTRAOCULAR LENS  IMPLANT, BILATERAL      . COLONOSCOPY Left 11/03/2016    Procedure: COLONOSCOPY;  Surgeon: [name], MD;  Location: [facility] ENDOSCOPY;  Service: Endoscopy;  Laterality: Left;  . COLONOSCOPY W/ BIOPSIES AND POLYPECTOMY      . CORONARY ARTERY BYPASS GRAFT   09/2008    pt with NSTEMI and diastolic CHF exacerbation.  LHC was done  3/10 showing 99% pRCA stenosis and 80% calcified pLAD stenosis with L=>R collaterals.  Pt was referred  for CABG which was done by Dr. Prescott Gum with LIMA-LAD, SVG-RCA, SVG-OM.  [name] ESOPHAGOGASTRODUODENOSCOPY N/A 11/02/2016    Procedure: ESOPHAGOGASTRODUODENOSCOPY (EGD);  Surgeon: [name], MD;  Location: [facility] ENDOSCOPY;  Service: Endoscopy;  Laterality: N/A;  . GIVENS CAPSULE STUDY N/A 11/29/2016    Procedure: GIVENS CAPSULE STUDY;  Surgeon: [name], MD;  Location: [place];  Service: Endoscopy;  Laterality: N/A;  . LIGATION OF ARTERIOVENOUS  FISTULA Left 09/13/2018    Procedure: LIGATION OF LEFT ARTERIOVENOUS  FISTULA;  Surgeon: Waynetta Sandy, MD;  Location: [place];  Service: Vascular;  Laterality: Left;  . REVISON OF ARTERIOVENOUS FISTULA Left 07/02/2015    Procedure: REVISON OF LEFT RADIOCEPHALIC ARTERIOVENOUS FISTULA;  Surgeon: [name], MD;  Location: [place];  Service: Vascular;  Laterality: Left;  . RIGHT/LEFT HEART CATH AND CORONARY/GRAFT ANGIOGRAPHY N/A 05/18/2018    Procedure: RIGHT/LEFT HEART CATH AND CORONARY/GRAFT ANGIOGRAPHY;  Surgeon:  Leonie Man, MD;  Location: [place] [facility];  Service: Cardiovascular;  Laterality: N/A;  . UPPER EXTREMITY ANGIOGRAPHY Left 10/18/2018    Procedure: UPPER EXTREMITY ANGIOGRAPHY;  Surgeon: Marty Heck, MD;  Location: [place] [facility];  Service: Cardiovascular;  Laterality: Left;    Family History  Adopted: Yes  Family history unknown: Yes    Social History:  reports that she has quit smoking. Her smoking use included cigarettes. She has never used smokeless tobacco. She reports current alcohol use. She reports that she does not use drugs. Allergies:       Allergies  Allergen Reactions  . Doxycycline Nausea And Vomiting      Caused "DEATHLY NAUSEA AND VOMITING"  . Lipitor [Atorvastatin] Other (See Comments)      Stomach  pain  . Strawberry Extract Rash          Medications Prior to Admission  Medication Sig Dispense Refill  . acetaminophen (TYLENOL) 500 MG tablet Take 1,000 mg by mouth 3 (three) times daily as needed for moderate pain.      [name] ALPRAZolam (XANAX) 1 MG tablet Take 1 mg by mouth at bedtime.       . AMITIZA 24 MCG capsule Take 24 mcg by mouth 2 (two) times daily as needed for constipation.       [name] b complex-vitamin c-folic acid (NEPHRO-VITE) 0.8 MG TABS tablet Take 1 tablet by mouth daily.   3  . calcitRIOL (ROCALTROL) 0.5 MCG capsule Take 0.5 mcg by mouth daily.   1  . diphenhydrAMINE (BENADRYL) 25 MG tablet Take 25 mg by mouth daily as needed for allergies.      . folic acid (FOLVITE) 1 MG tablet Take 1 tablet (1 mg total) by mouth daily. 30 tablet 0  . gabapentin (NEURONTIN) 100 MG capsule Take 100 mg by mouth at bedtime.      . midodrine (PROAMATINE) 10 MG tablet Take 10 mg by mouth 3 (three) times a week. Patient takes on dialysis days, M,W,F      . rosuvastatin (CRESTOR) 5 MG tablet TAKE 1 TABLET BY MOUTH EVERYDAY AT BEDTIME (Patient taking differently: Take 5 mg by mouth at bedtime. ) 90 tablet 3  . sevelamer carbonate (RENVELA) 800 MG tablet Take  1,600 mg 3 (three) times daily with meals by mouth.       . vitamin B-12 (CYANOCOBALAMIN) 500 MCG tablet Take 1 tablet (500 mcg total) by mouth daily. 30 tablet 0      Drug Regimen Review Drug regimen was reviewed and remains appropriate with no significant issues identified   Home: Home Living Family/patient expects to be discharged to:: Private residence Living Arrangements: Spouse/significant other Available Help at Discharge: Family, Available 24 hours/day Type of Home: House Home Access: Level entry Home Layout: One level Bathroom Shower/Tub: Chiropodist: Standard Home Equipment: Environmental consultant - 4 wheels, Toilet riser  Lives With: Spouse   Functional History: Prior Function Level of Independence: Needs assistance Gait / Transfers Assistance Needed: no device in the home, rollator to dialysis ADL's / Homemaking Assistance Needed: sponge bathes with some help from spouse   Functional Status:  Mobility: Bed Mobility Overal bed mobility: Needs Assistance Bed Mobility: Supine to Sit Rolling: Mod assist, Total assist Sidelying to sit: Max assist, HOB elevated, +2 for safety/equipment Supine to sit: Max assist, +2 for physical assistance Sit to supine: Total assist General bed mobility comments: Able to initiate LLE movement, cues for push off through L elbow. MaxA + 2 to transition to edge of bed, use of bed pad to scoot hips forward to edge Transfers Overall transfer level: Needs assistance Equipment used: None Transfers: Lateral/Scoot Transfers, Sit to/from Stand Sit to Stand: Max assist, +2 physical assistance Stand pivot transfers: Max assist  Lateral/Scoot Transfers: Max assist, +2 physical assistance, +2 safety/equipment General transfer comment: MaxA + 2 to stand from edge of bed x 2 with right knee blocked. TotalA for peri care in standing. MaxA + 2 for lateral scoot transfer towards left. Cues for hand placement   ADL: ADL Overall ADL's : Needs  assistance/impaired Eating/Feeding: Moderate assistance, Sitting Grooming: Moderate assistance, Sitting, Bed level, Maximal assistance Grooming Details (indicate cue type and reason): maxA to support in sitting;able to compensate with use of LUE;assistance for bilateral coordination  Upper Body Bathing: Maximal assistance, Sitting Lower Body Bathing: Maximal assistance, Sitting/lateral leans Upper Body Dressing : Maximal assistance, Sitting Lower Body Dressing: Total assistance, Sitting/lateral leans Toilet Transfer: Maximal assistance, +2 for physical assistance, +2 for safety/equipment Toilet Transfer Details (indicate cue type and reason): rolling R<>L for posterior pericare, pt incontinent of stool;simulated lateral scoot towards L to drop arm recliner Toileting- Clothing Manipulation and Hygiene: Total assistance Toileting - Clothing Manipulation Details (indicate cue type and reason): rolling R<>L for posterior pericare Functional mobility during ADLs: Maximal assistance, +2 for physical assistance, +2 for safety/equipment General ADL Comments: sat EOB for about 5-10 min with maxA +2 for safety;weight shifting sitting EOB with tactile and verbal cues for upright posture   Cognition: Cognition Overall Cognitive Status: Impaired/Different from baseline Arousal/Alertness: Awake/alert Orientation Level: Oriented X4 Attention: Selective Selective Attention: Appears intact Memory: Appears intact Awareness: Appears intact Problem Solving: Impaired Problem Solving Impairment: Verbal complex, Functional complex Safety/Judgment: Appears intact Comments: very delayed processing Cognition Arousal/Alertness: Awake/alert Behavior During Therapy: WFL for tasks assessed/performed Overall Cognitive Status: Impaired/Different from baseline Area of Impairment: Memory, Following commands, Problem solving Memory: Decreased short-term memory Following Commands: Follows one step commands  consistently, Follows one step commands with increased time Problem Solving: Slow processing, Decreased initiation, Requires verbal cues, Requires tactile cues General Comments: increased time for processing   Physical Exam: Blood pressure (!) 156/67, pulse 89, temperature 98.4 F (36.9 C), temperature source Oral, resp. rate 18, weight 54.4 kg, SpO2 97 %. Physical Exam  Constitutional: She is oriented to person, place, and time. No distress.  Frail appearing  HENT:  Head: Normocephalic and atraumatic.  Eyes: Pupils are equal, round, and reactive to light. EOM are normal.  Neck: No tracheal deviation present. No thyromegaly present.  Cardiovascular: Normal rate and regular rhythm. Exam reveals no friction rub.  No murmur heard. Respiratory: Effort normal and breath sounds normal. No respiratory distress. She has no wheezes.  GI: Soft. She exhibits no distension. There is no abdominal tenderness.  Musculoskeletal:        General: No edema. Normal range of motion.     Cervical back: Normal range of motion.  Neurological: She is alert and oriented to person, place, and time.  Fair insight and awareness. Speech clear. Right central 7. Mild right inattention. No field cut. RUE 0/5 RLE 0/5. LUE and LLE 4- to 4/5. Senses pain and light touch in all 4's. DTR's 1+  Skin: She is not diaphoretic.  A few scattered abrasions RUE>LE.       Lab Results Last 48 Hours        Results for orders placed or performed during the hospital encounter of 07/23/19 (from the past 48 hour(s))  Glucose, capillary     Status: None    Collection Time: 07/24/19  6:29 AM  Result Value Ref Range    Glucose-Capillary 78 70 - 99 mg/dL  Glucose, capillary     Status: Abnormal    Collection Time: 07/24/19 11:48 AM  Result Value Ref Range    Glucose-Capillary 174 (H) 70 - 99 mg/dL    Comment 1 Notify RN      Comment 2 Document in Chart    Renal function panel     Status: Abnormal    Collection Time: 07/24/19 12:50  PM  Result Value Ref Range    Sodium 133 (L) 135 - 145 mmol/L    Potassium 5.4 (H) 3.5 - 5.1 mmol/L      Comment: DELTA CHECK  NOTED NO VISIBLE HEMOLYSIS      Chloride 96 (L) 98 - 111 mmol/L    CO2 21 (L) 22 - 32 mmol/L    Glucose, Bld 173 (H) 70 - 99 mg/dL    BUN 45 (H) 8 - 23 mg/dL    Creatinine, Ser 6.52 (H) 0.44 - 1.00 mg/dL      Comment: DELTA CHECK NOTED DIALYSIS      Calcium 9.8 8.9 - 10.3 mg/dL      Comment: DELTA CHECK NOTED    Phosphorus 3.8 2.5 - 4.6 mg/dL    Albumin 3.1 (L) 3.5 - 5.0 g/dL    GFR calc non Af Amer 6 (L) >60 mL/min    GFR calc Af Amer 7 (L) >60 mL/min    Anion gap 16 (H) 5 - 15      Comment: Performed at [facility] [address] [place], [place] [address]  Hepatitis B core antibody, total     Status: None    Collection Time: 07/24/19  1:42 PM  Result Value Ref Range    Hep B Core Total Ab NON REACTIVE NON REACTIVE      Comment: Performed at [facility] [address] [place],  [address]  Hepatitis B surface antigen     Status: None    Collection Time: 07/24/19  1:42 PM  Result Value Ref Range    Hepatitis B Surface Ag NON REACTIVE NON REACTIVE      Comment: Performed at [facility] [address], [place],  [address]  Hepatitis B surface antibody     Status: Abnormal    Collection Time: 07/24/19  1:42 PM  Result Value Ref Range    Hepatitis B-Post 5.5 (L) Immunity>9.9 mIU/mL      Comment: (NOTE) **Verified by repeat analysis**  Status of Immunity                     Anti-HBs Level  ------------------                     -------------- Inconsistent with Immunity                   0.0 - 9.9 Consistent with Immunity                          >9.9 Performed At: [facility] [address] [place], [place] [id number] Rush Farmer MD [id number]    Glucose, capillary     Status: Abnormal    Collection Time: 07/24/19  5:06 PM  Result Value Ref Range    Glucose-Capillary 141 (H) 70 - 99 mg/dL     Comment 1 Notify RN      Comment 2 Document in Chart    Glucose, capillary     Status: Abnormal    Collection Time: 07/24/19  9:12 PM  Result Value Ref Range    Glucose-Capillary 256 (H) 70 - 99 mg/dL  Glucose, capillary     Status: Abnormal    Collection Time: 07/25/19  6:00 AM  Result Value Ref Range    Glucose-Capillary 109 (H) 70 - 99 mg/dL  Glucose, capillary     Status: Abnormal    Collection Time: 07/25/19 12:52 PM  Result Value Ref Range    Glucose-Capillary 272 (H) 70 - 99 mg/dL    Comment 1 Notify RN      Comment 2  Document in Chart    Glucose, capillary     Status: Abnormal    Collection Time: 07/25/19  3:42 PM  Result Value Ref Range    Glucose-Capillary 226 (H) 70 - 99 mg/dL    Comment 1 Notify RN      Comment 2 Document in Chart    Glucose, capillary     Status: Abnormal    Collection Time: 07/25/19  9:15 PM  Result Value Ref Range    Glucose-Capillary 155 (H) 70 - 99 mg/dL  Glucose, capillary     Status: Abnormal    Collection Time: 07/26/19  5:47 AM  Result Value Ref Range    Glucose-Capillary 107 (H) 70 - 99 mg/dL      Imaging Results (Last 48 hours)  No results found.           Medical Problem List and Plan: 1.  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  Antithrombotics: -DVT/anticoagulation: SCDs             -antiplatelet therapy: Aspirin 81 mg daily and Plavix 75 mg daily x3 weeks then aspirin alone 3. Pain Management: Tylenol as needed 4. Mood: Xanax 1 mg nightly             -antipsychotic agents: N/A 5. Neuropsych: This patient is capable of making decisions on her own behalf. 6. Skin/Wound Care: Routine skin checks             -local skin care for abrasions             -nutrition discussed 7. Fluids/Electrolytes/Nutrition: Routine and outs with follow-up chemistries 8.   End-stage renal disease.  Hemodialysis per renal services 9.  Orthostasis.  Continue ProAmatine 10 mg Monday Wednesday Friday 10.  Hyperlipidemia.  Crestor 11.  Chronic diastolic congestive heart failure.  Monitor for any signs of fluid overload. Daily weights 12.  Diet-controlled diabetes mellitus, hemoglobin A1c 5.3.  Blood sugar checks discontinued 13.  History of tobacco abuse.  Counseling 14.  CAD with CABG 2010.  Continue aspirin.  No chest pain reported. 15.  IBS/history of GI bleed.  Continue Amitiza 24 mcg daily     Cathlyn Parsons, PA-C 07/26/2019  I have personally performed a face to face diagnostic evaluation of this patient and formulated the key components of the plan.  Additionally, I have personally reviewed laboratory data, imaging studies, as well as relevant notes and concur with the physician assistant's documentation above.  The patient's status has not changed from the original H&P.  Any changes in documentation from the acute care chart have been noted above.  Meredith Staggers, MD, [facility]

## 2019-07-27 NOTE — Progress Notes (Addendum)
PROGRESS NOTE  [name] [id number] DOB: 24-Aug-1940 DOA: 07/23/2019 PCP: [name], MD  Brief history: Patient came from HD after an unresponsive episode.  On MRI found to have: Areas of acute infarction throughout the distal left anterior cerebral artery territory.   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  - CT angiogram negative for large vessel occlusion, however positive for significant intracranial and extracranial  atherosclerotic disease. 1. No emergent large vessel occlusion. 2. 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 3. 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 4. Bilateral carotid calcific atherosclerosis with 50% stenosis of the proximal right ICA. 5. Aortic Atherosclerosis (ICD10-I70.0). -LDL 16, A1c 5.3 -Was previously not on any antithrombotic biotic prior to admission due to history of GI bleed, she is now agreeable to aspirin enteric coating 81 mg daily with Plavix for 3 weeks then aspirin 81 mg alone per neurology recommendation - got insurance approval for CIR placement, however, there is concerns of oscillating density on dialysis catheter, will proceed with TEE, neurology and nephrology aware of it   Echo cardiogram showed "catheter in right atrium with oscillating density (consider vegetation vs thrombus)." Case  discussed with neurology Dr. Leonie Man who recommended TEE, case discussed with cardiology , TEE scheduled on 12/16. Case discussed with nephrology Dr Jonnie Finner who ordered blood culture Will need to get in touch with neurology to discuss TEE result.   History of GI bleed in March 2018 required PRBC transfusion in the past -Per chart review she does not desire any GI work-up - H&H has been stable here, continue monitor  ESRD on hemodialysis Monday Wednesday Friday, plan per nephrology Anemia of chronic disease    H/o NSVT -had7beats of NSVT on tele during dialysis on 12/9 pm -Blood pressure is too low for betablocker or ccb -keep on tele  Addendum: she has sinus tachycardia on telemetry this afternoon, she denies symptom, she denies feeling dizzy, no chest pain, no sob, no hypoxia. 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 Carlis Abbott  no surgical intervention needed as long as patient remains asymptomatic F/u with Dr Carlis Abbott as needed   Stage I decubitus ulcer sacral present on presentation, pressure offloading measures  FTT: very frail  DVT Prophylaxis:scd's  Code Status: Full  Family Communication: patient , husband over the phone on 12/9  and December 11, husband at bedside on 12/12, I called son [name] over the phone and left message today  Addendum: talked to son over the phone around lunch time and provided update, son prefers to be  contacted around lunch time daily   Disposition Plan: awaiting TEE, then CIR placement   Consultants:  Neurology  Nephrology  cardiology  CIR  Vascular surgery Dr. Carlis Abbott over the phone  Procedures:  Dialysis Monday Wednesday Friday  TEE scheduled on 2/16  Antibiotics:  None   Objective: BP (!) 97/54 (BP Location: Left Arm)   Pulse 97   Temp 98.4 F (36.9 C) (Oral)   Resp 15   Wt 52.9 kg   SpO2 100%   BMI 17.73 kg/m   Intake/Output Summary (Last 24 hours) at 07/27/2019 1033 Last data filed at 07/26/2019 1615 Gross per 24 hour  Intake 120 ml  Output 1523 ml  Net -1403 ml   Filed Weights   07/25/19 0351 07/26/19 1240 07/26/19 1615  Weight: 54.4 kg 54.3 kg 52.9 kg    Exam: Patient is examined daily including today on 07/27/2019, exams remain the same as of yesterday except that has changed    General:  Frail, chronically ill appearing but NAD  Cardiovascular: RRR  Respiratory: CTABL  Abdomen: Soft/ND/NT, positive BS  Musculoskeletal: No Edema  Neuro: alert, oriented , dense right hemiplegia, sensation intact  Data Reviewed: Basic Metabolic Panel: Recent Labs  Lab 07/23/19 0038 07/23/19 0051 07/23/19 0420 07/24/19 1250 07/26/19 1158 07/26/19 1427  NA 136 135 139 133* 130* 133*  K 4.5 4.4 2.4* 5.4* 4.7 4.7  CL 97* 100 114* 96* 94* 94*  CO2 25  --  19* 21* 23 25  GLUCOSE 156* 153* 100* 173* 242* 192*  BUN 22 23 18  45* 58* 59*  CREATININE 4.26* 4.20* 2.92* 6.52* 6.86* 6.68*  CALCIUM 9.1  --  5.7* 9.8 9.1 9.4  MG  --   --  1.4*  --   --   --   PHOS  --   --   --  3.8 2.5 2.4*   Liver Function Tests: Recent Labs  Lab 07/23/19 0038 07/23/19 0420 07/24/19 1250 07/26/19 1158 07/26/19 1427  AST 28 15  --   --   --   ALT 20 11  --   --   --   ALKPHOS 98 52  --   --   --   BILITOT 0.9 0.2*  --   --   --   PROT 7.1 4.0*  --   --   --   ALBUMIN 3.2* 1.9* 3.1* 2.7* 2.8*   No results for input(s): LIPASE, AMYLASE in the last 168  hours. No results for input(s): AMMONIA in the last 168 hours. CBC: Recent Labs  Lab 07/23/19 0038 07/23/19 0051 07/23/19 0420 07/24/19 0359 07/26/19 1158 07/26/19 1427  WBC 9.9  --  6.9 10.4 10.4 10.8*  NEUTROABS 7.5  --   --   --   --   --   HGB 12.6 13.9 7.8* 11.8* 11.7* 11.7*  HCT 40.8 41.0 26.0* 38.4 36.4 36.5  MCV 112.7*  --  115.0* 112.0* 108.3* 109.0*  PLT 125*  --  90* 128* 112* 120*   Cardiac Enzymes:   No results for input(s): CKTOTAL, CKMB, CKMBINDEX, TROPONINI in the last 168 hours. BNP (last 3 results) No results for input(s): BNP in the last 8760 hours.  ProBNP (last 3 results) No results for input(s): PROBNP in  the last 8760 hours.  CBG: Recent Labs  Lab 07/26/19 0547 07/26/19 1117 07/26/19 1702 07/26/19 2242 07/27/19 0642  GLUCAP 107* 216* 106* 145* 119*    Recent Results (from the past 240 hour(s))  SARS CORONAVIRUS 2 (TAT 6-24 HRS) Nasopharyngeal Nasopharyngeal Swab     Status: None   Collection Time: 07/23/19  2:02 AM   Specimen: Nasopharyngeal Swab  Result Value Ref Range Status   SARS Coronavirus 2 NEGATIVE NEGATIVE Final    Comment: (NOTE) SARS-CoV-2 target nucleic acids are NOT DETECTED. The SARS-CoV-2 RNA is generally detectable in upper and lower respiratory specimens during the acute phase of infection. Negative results do not preclude SARS-CoV-2 infection, do not rule out co-infections with other pathogens, and should not be used as the sole basis for treatment or other patient management decisions. Negative results must be combined with clinical observations, patient history, and epidemiological information. The expected result is Negative. Fact Sheet for Patients: SugarRoll.be Fact Sheet for Healthcare Providers: https://www.woods-mathews.com/ This test is not yet approved or cleared by the [place] FDA and  has been authorized for detection and/or diagnosis of SARS-CoV-2 by FDA under  an Emergency Use Authorization (EUA). This EUA will remain  in effect (meaning this test can be used) for the duration of the COVID-19 declaration under Section 56 4(b)(1) of the Act, 21 U.S.C. section 360bbb-3(b)(1), unless the authorization is terminated or revoked sooner. Performed at [facility], [place] [address], [place], [place] [address]   Culture, blood (routine x 2)     Status: None (Preliminary result)   Collection Time: 07/26/19  5:29 PM   Specimen: BLOOD LEFT ARM  Result Value Ref Range Status   Specimen Description BLOOD LEFT ARM  Final   Special Requests   Final    BOTTLES DRAWN AEROBIC ONLY Blood Culture adequate volume   Culture   Final    NO GROWTH < 12 HOURS Performed at [facility], [place] [address], [place], [place] [address]    Report Status PENDING  Incomplete  Culture, blood (routine x 2)     Status: None (Preliminary result)   Collection Time: 07/26/19  5:29 PM   Specimen: BLOOD RIGHT ARM  Result Value Ref Range Status   Specimen Description BLOOD RIGHT ARM  Final   Special Requests   Final    BOTTLES DRAWN AEROBIC ONLY Blood Culture adequate volume   Culture   Final    NO GROWTH < 12 HOURS Performed at [facility], [place] [address], [place], [place] [address]    Report Status PENDING  Incomplete     Studies: No results found.  Scheduled Meds: . ALPRAZolam  1 mg Oral QHS  . aspirin EC  81 mg Oral Daily  . calcitRIOL  0.5 mcg Oral Daily  . Chlorhexidine Gluconate Cloth  6 each Topical Q0600  . clopidogrel  75 mg Oral Daily  . folic acid  1 mg Oral Daily  . insulin aspart  0-9 Units Subcutaneous TID WC  . lubiprostone  24 mcg Oral Q breakfast  . midodrine  10 mg Oral Q M,W,F-HD  . multivitamin  1 tablet Oral QHS  . rosuvastatin  20 mg Oral q1800  . sevelamer carbonate  1,600 mg Oral TID WC  . sodium chloride flush  3 mL Intravenous Once  . vitamin B-12  500 mcg Oral Daily    Continuous Infusions:    Time spent: 35 mins I  have personally reviewed  and interpreted on  07/27/2019 daily labs, tele strips, imagings as discussed above under date review session and assessment and plans.  I reviewed all nursing notes, pharmacy notes, consultant notes,  vitals, pertinent old records  I have discussed plan of care as described above with RN , patient and husband at bedside,   on 07/27/2019   Florencia Reasons MD, PhD, FACP  [facility] Pager [phone]. If 7PM-7AM, please contact night-coverage at [email or web address], password [facility] 07/27/2019, 10:33 AM  LOS: 4 days

## 2019-07-28 ENCOUNTER — Inpatient Hospital Stay (HOSPITAL_COMMUNITY): Payer: Medicare HMO

## 2019-07-28 ENCOUNTER — Inpatient Hospital Stay (HOSPITAL_COMMUNITY): Payer: Medicare HMO | Admitting: Occupational Therapy

## 2019-07-28 ENCOUNTER — Inpatient Hospital Stay (HOSPITAL_COMMUNITY): Payer: Medicare HMO | Admitting: Speech Pathology

## 2019-07-28 DIAGNOSIS — I5032 Chronic diastolic (congestive) heart failure: Secondary | ICD-10-CM

## 2019-07-28 DIAGNOSIS — I63321 Cerebral infarction due to thrombosis of right anterior cerebral artery: Secondary | ICD-10-CM

## 2019-07-28 LAB — GLUCOSE, CAPILLARY
Glucose-Capillary: 159 mg/dL — ABNORMAL HIGH (ref 70–99)
Glucose-Capillary: 242 mg/dL — ABNORMAL HIGH (ref 70–99)
Glucose-Capillary: 171 mg/dL — ABNORMAL HIGH (ref 70–99)
Glucose-Capillary: 146 mg/dL — ABNORMAL HIGH (ref 70–99)

## 2019-07-28 MED ORDER — CHLORHEXIDINE GLUCONATE CLOTH 2 % EX PADS
6.0000 | MEDICATED_PAD | Freq: Two times a day (BID) | CUTANEOUS | Status: DC
  Administered 2019-07-28 – 2019-07-30 (×4): 6 via TOPICAL

## 2019-07-28 MED ORDER — DIPHENHYDRAMINE HCL 25 MG PO CAPS: 25.0000 mg | ORAL_CAPSULE | Freq: Two times a day (BID) | ORAL | Status: DC | PRN

## 2019-07-28 MED ORDER — CAMPHOR-MENTHOL 0.5-0.5 % EX LOTN
TOPICAL_LOTION | CUTANEOUS | Status: DC | PRN
  Administered 2019-07-29: 07:00:00 via TOPICAL
  Filled 2019-07-28: qty 222

## 2019-07-28 MED ORDER — DIPHENHYDRAMINE HCL 25 MG PO CAPS: 25.0000 mg | ORAL_CAPSULE | Freq: Every day | ORAL | Status: DC | PRN

## 2019-07-28 NOTE — Evaluation (Signed)
Occupational Therapy Assessment and Plan  Patient Details  Name: [name] MRN: [id number] Date of Birth: 12-09-1940  OT Diagnosis: abnormal posture, cognitive deficits, disturbance of vision, flaccid hemiplegia and hemiparesis, hemiplegia affecting dominant side and muscle weakness (generalized) Rehab Potential: Rehab Potential (ACUTE ONLY): Fair ELOS: 21-24 days   Today's Date: 07/28/2019 OT Individual Time: 4287-6811 OT Individual Time Calculation (min): 60 min     Problem List:  Patient Active Problem List   Diagnosis Date Noted  . Small vessel cerebrovascular accident (CVA) ([place]) 07/27/2019  . Stage I pressure ulcer of sacral region 07/25/2019  . TIA (transient ischemic attack) 07/23/2019  . CVA (cerebral vascular accident) ([place]) 07/23/2019  . Volume overload 05/27/2018  . Dyspnea 05/27/2018  . Acute respiratory failure with hypoxia ([place]) 05/27/2018  . Acute respiratory distress 05/27/2018  . Acute encephalopathy 05/27/2018  . Aspiration pneumonia ([place]) 05/27/2018  . Pressure ulcer 05/19/2018  . Acute on chronic combined systolic and diastolic heart failure ([place]) 05/17/2018  . LBBB (left bundle branch block) 05/17/2018  . Aortic stenosis, moderate 05/17/2018  . NSTEMI (non-ST elevated myocardial infarction) ([place]) 05/16/2018  . Symptomatic anemia 05/15/2018  . Demand ischemia ([place]) 05/15/2018  . Protein-calorie malnutrition, severe 04/20/2018  . Pressure injury of skin 04/18/2018  . GI bleed 10/31/2016  . Blood loss anemia 10/31/2016  . AKI (acute kidney injury) ([place]) 10/31/2016  . Stroke ([place]) 09/26/2016  . ESRD (end stage renal disease) on dialysis ([place]) 06/23/2015  . Balance problem 12/22/2013  . Edema 12/28/2011  . CLAUDICATION 11/01/2010  . CAROTID ARTERY STENOSIS 10/29/2009  . AORTIC STENOSIS 09/17/2009  . Essential hypertension 11/17/2008  . DIASTOLIC HEART FAILURE, CHRONIC 11/17/2008  . Diabetes mellitus type II, non insulin dependent ([place]) 11/12/2008   . Other and unspecified hyperlipidemia 11/12/2008  . Anemia, chronic renal failure 11/12/2008  . Thrombocytopenia ([place]) 11/12/2008  . Anxiety state 11/12/2008  . Coronary atherosclerosis 11/12/2008  . UNSPEC COMBINED SYSTOLIC&DIASTOLIC HEART FAILURE 57/26/2035  . ESOPHAGEAL REFLUX 11/12/2008  . Chronic kidney disease (CKD), stage V ([place]) 11/12/2008  . LOW BACK PAIN, CHRONIC 11/12/2008  . INSOMNIA UNSPECIFIED 11/12/2008    Past Medical History:  Past Medical History:  Diagnosis Date  . Anemia    Pt is taking iron.   [name] Anxiety   . Arthritis   . Carotid stenosis    40-59% bilateral ICA stenosis in 2/12.  . Chronic low back pain   . CKD (chronic kidney disease)    Dr. Audie Clear at [facility]  . Coronary artery disease    Pt presented 2/10 to [facility] with NSTEMI and diastolic CHF exacerbation.  LHC was done  3/10 showing 99% pRCA stenosis and 80% calcified pLAD stenosis with L=>R collaterals.  Pt was referred  for CABG which was done by Dr. Prescott Gum with LIMA-LAD, SVG-RCA, SVG-OM.  . Diabetes mellitus   . Diabetic neuropathy ([place])   . Diastolic CHF ([facility])    Echo (2/10) showed EF 55-65%, mild LVH, diastolic dysfunction, mild AS with mean gradient 12 mmHg, PASP 43 mmHg.  Echo (2/12): EF 55-60%, mild LVH, mild AS (mean gradient 12), PA systolic pressure 32 mmHg.     [name] GERD (gastroesophageal reflux disease)   . Heart murmur   . Hyperlipidemia   . Hypertension   . Mild aortic stenosis    mean gradient 12 mmHg in 2/12.  . Myocardial infarction ([place])    "mild"  . Pneumonia   . PONV (postoperative nausea and vomiting)   .  Stroke [facility])    " mild"  . Thrombocytopenia ([place])   . Unsteady gait    Past Surgical History:  Past Surgical History:  Procedure Laterality Date  . AORTIC ARCH ANGIOGRAPHY N/A 10/18/2018   Procedure: AORTIC ARCH ANGIOGRAPHY;  Surgeon: Marty Heck, MD;  Location: [facility];  Service: Cardiovascular;  Laterality: N/A;  . AV FISTULA  PLACEMENT Left 01/30/2015   Procedure: Creation of a Radial Cephalic AV Fistula left wrist;  Surgeon: [name], MD;  Location: [facility];  Service: Vascular;  Laterality: Left;  . AV FISTULA PLACEMENT Left 09/06/2018   Procedure: LEFT ARM ARTERIOVENOUS (AV) FISTULA CREATION;  Surgeon: Marty Heck, MD;  Location: [facility];  Service: Vascular;  Laterality: Left;  . BACK SURGERY     multiple  . BREAST SURGERY     biopsy  . CARDIAC CATHETERIZATION    . CATARACT EXTRACTION W/ INTRAOCULAR LENS  IMPLANT, BILATERAL    . COLONOSCOPY Left 11/03/2016   Procedure: COLONOSCOPY;  Surgeon: [name], MD;  Location: [facility];  Service: Endoscopy;  Laterality: Left;  . COLONOSCOPY W/ BIOPSIES AND POLYPECTOMY    . CORONARY ARTERY BYPASS GRAFT  09/2008   pt with NSTEMI and diastolic CHF exacerbation.  LHC was done  3/10 showing 99% pRCA stenosis and 80% calcified pLAD stenosis with L=>R collaterals.  Pt was referred  for CABG which was done by Dr. Prescott Gum with LIMA-LAD, SVG-RCA, SVG-OM.  [name] ESOPHAGOGASTRODUODENOSCOPY N/A 11/02/2016   Procedure: ESOPHAGOGASTRODUODENOSCOPY (EGD);  Surgeon: [name], MD;  Location: [facility];  Service: Endoscopy;  Laterality: N/A;  . GIVENS CAPSULE STUDY N/A 11/29/2016   Procedure: GIVENS CAPSULE STUDY;  Surgeon: [name], MD;  Location: [place];  Service: Endoscopy;  Laterality: N/A;  . LIGATION OF ARTERIOVENOUS  FISTULA Left 09/13/2018   Procedure: LIGATION OF LEFT ARTERIOVENOUS  FISTULA;  Surgeon: Waynetta Sandy, MD;  Location: [facility];  Service: Vascular;  Laterality: Left;  . REVISON OF ARTERIOVENOUS FISTULA Left 07/02/2015   Procedure: REVISON OF LEFT RADIOCEPHALIC ARTERIOVENOUS FISTULA;  Surgeon: [name], MD;  Location: [place];  Service: Vascular;  Laterality: Left;  . RIGHT/LEFT HEART CATH AND CORONARY/GRAFT ANGIOGRAPHY N/A 05/18/2018   Procedure: RIGHT/LEFT HEART CATH AND CORONARY/GRAFT ANGIOGRAPHY;  Surgeon: Leonie Man, MD;   Location: [place] [facility];  Service: Cardiovascular;  Laterality: N/A;  . UPPER EXTREMITY ANGIOGRAPHY Left 10/18/2018   Procedure: UPPER EXTREMITY ANGIOGRAPHY;  Surgeon: Marty Heck, MD;  Location: [place] [facility];  Service: Cardiovascular;  Laterality: Left;    Assessment & Plan Clinical Impression: Patient is a 78 y.o. right-handed female with history of hypertension, hyperlipidemia, diabetes mellitus, history of GI bleed,IBS,congestive diastolic heart failure, end-stage renal disease with hemodialysis Monday Wednesday and Fridays, CAD/CABG 2010, aortic stenosis, history of right thalamic CVA 2018, tobacco abuse. Per chart review patient lives with spouse. 1 level home. Patient did use a Rollator when going to dialysis. Presented 07/23/2019 with right-sided weakness. Cranial CT scan showed no intracranial hemorrhage. Chronic small vessel ischemia and generalized volume loss noted. Patient did not receive TPA. CT angiogram negative for large vessel occlusion however positive for significant intracranial and extracranial atherosclerotic disease. MRI of the brain areas of acute infarction throughout the distal left anterior cerebral artery territory. Echocardiogram with ejection fraction of 30% without embolihowever it did show catheter at right atrium with oscillating density considering vegetation versus thrombus discussed with neurology services awaiting plan for TEE... Admission chemistries with  creatinine 4.26, SARS coronavirus negative, hemoglobin 12.6, platelet count 125,000. Neurology follow-up maintained on aspirin and Plavix for CVA prophylaxis x3 weeks then aspirin alone. Hemodialysis ongoing as per renal services. Therapy evaluations completed and patient was admitted for a comprehensive rehab program.   Patient transferred to CIR on 07/27/2019 .    Patient currently requires max to total +2 with basic self-care skills secondary to muscle weakness, decreased  cardiorespiratoy endurance, abnormal tone and unbalanced muscle activation, decreased attention to right, decreased awareness, decreased problem solving and delayed processing and decreased sitting balance, decreased standing balance, decreased postural control, hemiplegia and decreased balance strategies.  Prior to hospitalization, patient could complete ADLs with modified independent .  Patient will benefit from skilled intervention to decrease level of assist with basic self-care skills prior to discharge home with care partner.  Anticipate patient will require 24 hour supervision and minimal physical assistance and follow up home health.  OT - End of Session Activity Tolerance: Tolerates 30+ min activity with multiple rests Endurance Deficit: Yes Endurance Deficit Description: requires frequent rest breaks OT Assessment Rehab Potential (ACUTE ONLY): Fair OT Barriers to Discharge: Home environment access/layout;Incontinence OT Barriers to Discharge Comments: 2 steps down to den and 2 steps in to home OT Patient demonstrates impairments in the following area(s): Balance;Cognition;Endurance;Motor;Pain;Perception;Safety;Skin Integrity;Vision OT Basic ADL's Functional Problem(s): Eating;Grooming;Bathing;Dressing;Toileting OT Transfers Functional Problem(s): Toilet OT Additional Impairment(s): Fuctional Use of Upper Extremity OT Plan OT Intensity: Minimum of 1-2 x/day, 45 to 90 minutes OT Frequency: 5 out of 7 days OT Duration/Estimated Length of Stay: 21-24 days OT Treatment/Interventions: Balance/vestibular training;Cognitive remediation/compensation;Community reintegration;Discharge planning;Disease mangement/prevention;DME/adaptive equipment instruction;Functional electrical stimulation;Functional mobility training;Neuromuscular re-education;Pain management;Patient/family education;Psychosocial support;Self Care/advanced ADL retraining;Skin care/wound managment;Splinting/orthotics;Therapeutic  Activities;Therapeutic Exercise;UE/LE Strength taining/ROM;UE/LE Coordination activities;Visual/perceptual remediation/compensation;Wheelchair propulsion/positioning OT Self Feeding Anticipated Outcome(s): Supervision/Setup OT Basic Self-Care Anticipated Outcome(s): Min assist OT Toileting Anticipated Outcome(s): Min assist OT Bathroom Transfers Anticipated Outcome(s): Min assist OT Recommendation Recommendations for Other Services: Neuropsych consult;Therapeutic Recreation consult Therapeutic Recreation Interventions: Stress management(coping/leisure pursuits) Patient destination: Home Follow Up Recommendations: Home health OT;24 hour supervision/assistance Equipment Recommended: 3 in 1 bedside comode   Skilled Therapeutic Intervention OT eval completed with discussion of rehab process, OT purpose, POC, ELOS, and goals.  ADL assessment completed with bathing and dressing at EOB with exception of perineal hygiene at bed level.  Pt required max assist for rolling and sidelying to sitting.  Mod-max assist for sitting balance due to Lt and posterior lean with decreased ability to achieve full upright sitting posture.  Flaccid RUE/RLE requiring total assist to manage limb when engaging in dressing.  +2 for safety throughout session and +2 required to attempt sit > stand.  Initially unable to achieve full upright standing, however on last attempt therapist stood infront of pt and completed total assist sit > stand while 2nd person assist with clothing management from behind.  Squat pivot transfer max-total assist +2 to Rt in to recliner.  Pt remained upright in recliner with pillow under RUE for positioning and setup with breakfast tray.  Encouraged pt to attempt eating, pt stating she has no appetite.   OT Evaluation Precautions/Restrictions  Precautions Precautions: Fall Precaution Comments: mild Rt inattention Pain Pain Assessment Pain Scale: 0-10 Pain Score: 0-No pain Home Living/Prior  Functioning Home Living Available Help at Discharge: Family, Available 24 hours/day Type of Home: House Home Access: Stairs to enter [employer] of Steps: one step to porch and one step in to house Entrance Stairs-Rails: None Home Layout: One level(reports 2  steps down in to den) Bathroom Shower/Tub: Tub/shower unit(typically sponge bathes due to HD cath) [employer]: Standard  Lives With: Spouse IADL History Homemaking Responsibilities: Yes Meal Prep Responsibility: Therapist, occupational Responsibility: Primary Cleaning Responsibility: No Shopping Responsibility: No Prior Function Level of Independence: Independent with basic ADLs, Independent with homemaking with ambulation(used Rollator when going to HD, husband would assist with washing her back)  Able to Take Stairs?: Yes Driving: No Vocation: Retired ADL ADL Eating: Set up Where Assessed-Eating: Chair Upper Body Bathing: Moderate assistance Where Assessed-Upper Body Bathing: Edge of bed Lower Body Bathing: Dependent Where Assessed-Lower Body Bathing: Bed level Upper Body Dressing: Maximal assistance Where Assessed-Upper Body Dressing: Edge of bed Lower Body Dressing: Dependent(+2) Where Assessed-Lower Body Dressing: Edge of bed Toilet Transfer: Maximal assistance(+2) Toilet Transfer Method: Stand pivot Science writer: Drop arm bedside commode Vision Baseline Vision/History: Wears glasses Wears Glasses: Reading only Patient Visual Report: No change from baseline Vision Assessment?: Yes;Vision impaired- to be further tested in functional context Ocular Range of Motion: Within Functional Limits Alignment/Gaze Preference: Chin down;Head turned;Gaze left Saccades: Undershoots;Decreased speed of saccadic movement;Additional eye shifts occurred during testing Additional Comments: pt with Lt gaze preference, able to turn head to locate stimulus in Rt with prompting Cognition Overall Cognitive Status:  Impaired/Different from baseline Arousal/Alertness: Awake/alert Orientation Level: Person;Place;Situation Person: Oriented Place: Oriented Situation: Oriented Year: 2020 Month: December Day of Week: Incorrect Memory: Appears intact Immediate Memory Recall: Sock;Blue;Bed Memory Recall Sock: Without Cue Memory Recall Blue: Without Cue Memory Recall Bed: Without Cue Attention: Selective Selective Attention: Appears intact Awareness: Appears intact Problem Solving: Impaired Problem Solving Impairment: Verbal complex;Functional basic Safety/Judgment: Appears intact Comments: very delayed processing Sensation Sensation Light Touch: Appears Intact Coordination Gross Motor Movements are Fluid and Coordinated: No Fine Motor Movements are Fluid and Coordinated: No Coordination and Movement Description: flaccid RUE/RLE Finger Nose Finger Test: flaccid RUE Motor  Motor Motor: Hemiplegia;Abnormal tone;Abnormal postural alignment and control Motor - Skilled Clinical Observations: poor postural control; decreased orientation to midline; hypotonic RUE/RLE Mobility  Bed Mobility Bed Mobility: Rolling Left;Supine to Sit Rolling Left: Maximal Assistance - Patient 25-49% Supine to Sit: 2 Helpers Transfers Sit to Stand: 2 Helpers  Trunk/Postural Assessment  Cervical Assessment Cervical Assessment: Exceptions to WFL(decreased extension; unable to attain neutral) Thoracic Assessment Thoracic Assessment: Exceptions to WFL(significant kyphosis) Lumbar Assessment Lumbar Assessment: Exceptions to [facility] Postural Control Postural Control: Deficits on evaluation Trunk Control: up to max assist to maintain seated Righting Reactions: delayed and inadequate Protective Responses: delayed and inadequate  Balance Balance Balance Assessed: Yes Static Sitting Balance Static Sitting - Level of Assistance: 4: Min assist;2: Max assist Dynamic Sitting Balance Dynamic Sitting - Level of Assistance: 1:  +1 Total assist Extremity/Trunk Assessment RUE Assessment RUE Assessment: Exceptions to [facility] Passive Range of Motion (PROM) Comments: tolerates WNL without any pain Active Range of Motion (AROM) Comments: flaccid General Strength Comments: flaccid LUE Assessment LUE Assessment: Within Functional Limits Active Range of Motion (AROM) Comments: WFL General Strength Comments: grossly 4/5     Refer to Care Plan for Long Term Goals  Recommendations for other services: Therapeutic Recreation  Stress management and Other coping/leisure pursuits   Discharge Criteria: Patient will be discharged from OT if patient refuses treatment 3 consecutive times without medical reason, if treatment goals not met, if there is a change in medical status, if patient makes no progress towards goals or if patient is discharged from hospital.  The above assessment, treatment plan, treatment alternatives and goals were  discussed and mutually agreed upon: by patient  Simonne Come 07/28/2019, 9:31 AM

## 2019-07-28 NOTE — Progress Notes (Signed)
[facility] PHYSICAL MEDICINE & REHABILITATION PROGRESS NOTE   Subjective/Complaints: Says she slept pretty well. Doesn't have much appetite. C/o of itching on back/legs.   ROS: Patient denies fever, rash, sore throat, blurred vision, nausea, vomiting, diarrhea, cough, shortness of breath or chest pain, joint or back pain, headache, or mood change.    Objective:   No results found. Recent Labs    07/26/19 1158 07/26/19 1427  WBC 10.4 10.8*  HGB 11.7* 11.7*  HCT 36.4 36.5  PLT 112* 120*   Recent Labs    07/26/19 1158 07/26/19 1427  NA 130* 133*  K 4.7 4.7  CL 94* 94*  CO2 23 25  GLUCOSE 242* 192*  BUN 58* 59*  CREATININE 6.86* 6.68*  CALCIUM 9.1 9.4    Intake/Output Summary (Last 24 hours) at 07/28/2019 1127 Last data filed at 07/28/2019 0900 Gross per 24 hour  Intake 240 ml  Output --  Net 240 ml     Physical Exam: Vital Signs Blood pressure 110/62, pulse 93, temperature 98 F (36.7 C), resp. rate 18, height 5\' 8"  (1.727 m), weight 48.9 kg, SpO2 100 %.  Constitutional: No distress . Vital signs reviewed.frail appearing HEENT: EOMI, oral membranes moist Neck: supple Cardiovascular: RRR without murmur. No JVD    Respiratory: CTA Bilaterally without wheezes or rales. Normal effort    GI: BS +, non-tender, non-distended   Musculoskeletal:  General: No edema.Normal range of motion.  Cervical back: Normal range of motion.  Neurological: She isalertand oriented to person, place, and time.quiet voice. Fair insight and awareness. Speech clear. Right central 7. Mild right inattention. No field cut. RUE 0/5 RLE 0/5. LUE and LLE 4- to 4/5. No motor changes  Senses pain and light touch in all 4's. DTR's 1+ Skin: She isnot diaphoretic. A few scattered abrasions RUE>LE.foam dressing RUE   Assessment/Plan: 1. 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 which require 3+ hours per day of interdisciplinary therapy in a comprehensive inpatient rehab  setting.  Physiatrist is providing close team supervision and 24 hour management of active medical problems listed below.  Physiatrist and rehab team continue to assess barriers to discharge/monitor patient progress toward functional and medical goals  Care Tool:  Bathing              Bathing assist       Upper Body Dressing/Undressing Upper body dressing        Upper body assist      Lower Body Dressing/Undressing Lower body dressing            Lower body assist       Toileting Toileting    Toileting assist Assist for toileting: Maximal Assistance - Patient 25 - 49%     Transfers Chair/bed transfer  Transfers assist           Locomotion Ambulation   Ambulation assist              Walk 10 feet activity   Assist           Walk 50 feet activity   Assist           Walk 150 feet activity   Assist           Walk 10 feet on uneven surface  activity   Assist           Wheelchair     Assist               Wheelchair 50  feet with 2 turns activity    Assist            Wheelchair 150 feet activity     Assist          Blood pressure 110/62, pulse 93, temperature 98 F (36.7 C), resp. rate 18, height 5\' 8"  (1.727 m), weight 48.9 kg, SpO2 100 %.  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 To be done ?Tuesday  2. Antithrombotics: -DVT/anticoagulation:SCDs -antiplatelet therapy: Aspirin 81 mg daily and Plavix 75 mg daily x3 weeks then aspirin alone 3. Pain Management:Tylenol as needed 4. Mood:Xanax 1 mg  nightly -antipsychotic agents: N/A 5. Neuropsych: This patientiscapable of making decisions on herown behalf. 6. Skin/Wound Care:Routine skin checks -local skin care for abrasions -nutrition discussed  -benadryl prn for itching  -sarna lotion prn 7. Fluids/Electrolytes/Nutrition:Routine and outs with follow-up chemistries 8. End-stage renal disease. Hemodialysis per renal services. HD after therapy day to allow for better therapy participation 9. Orthostasis. Continue ProAmatine 10 mg Monday Wednesday Friday 10. Hyperlipidemia. Crestor 11. Chronic diastolic congestive heart failure. Monitor for any signs of fluid overload. Daily weights   Filed Weights   07/27/19 1830 07/28/19 0452  Weight: 53 kg 48.9 kg    EF 25-30% with severe AS 12. Diet-controlled diabetes mellitus, hemoglobin A1c 5.3. Blood sugar checks discontinued 13. History of tobacco abuse. Counseling 14. CAD with CABG 2010. Continue aspirin. No chest pain reported. 15. IBS/history of GI bleed. Continue Amitiza24 mcg daily    LOS: 1 days A FACE TO FACE EVALUATION WAS PERFORMED  Meredith Staggers 07/28/2019, 11:27 AM

## 2019-07-28 NOTE — Evaluation (Signed)
Speech Language Pathology Assessment and Plan  Patient Details  Name: [name] MRN: [id number] Date of Birth: 23-Jun-1941  SLP Diagnosis: Cognitive Impairments  Rehab Potential: Good ELOS: 21-24 days    Today's Date: 07/28/2019 SLP Individual Time: 1305-1400 SLP Individual Time Calculation (min): 55 min   Problem List:  Patient Active Problem List   Diagnosis Date Noted  . Small vessel cerebrovascular accident (CVA) ([place]) 07/27/2019  . Stage I pressure ulcer of sacral region 07/25/2019  . TIA (transient ischemic attack) 07/23/2019  . CVA (cerebral vascular accident) ([place]) 07/23/2019  . Volume overload 05/27/2018  . Dyspnea 05/27/2018  . Acute respiratory failure with hypoxia ([employer]) 05/27/2018  . Acute respiratory distress 05/27/2018  . Acute encephalopathy 05/27/2018  . Aspiration pneumonia ([place]) 05/27/2018  . Pressure ulcer 05/19/2018  . Acute on chronic combined systolic and diastolic heart failure ([place]) 05/17/2018  . LBBB (left bundle branch block) 05/17/2018  . Aortic stenosis, moderate 05/17/2018  . NSTEMI (non-ST elevated myocardial infarction) ([place]) 05/16/2018  . Symptomatic anemia 05/15/2018  . Demand ischemia ([place]) 05/15/2018  . Protein-calorie malnutrition, severe 04/20/2018  . Pressure injury of skin 04/18/2018  . GI bleed 10/31/2016  . Blood loss anemia 10/31/2016  . AKI (acute kidney injury) ([place]) 10/31/2016  . Stroke ([place]) 09/26/2016  . ESRD (end stage renal disease) on dialysis ([place]) 06/23/2015  . Balance problem 12/22/2013  . Edema 12/28/2011  . CLAUDICATION 11/01/2010  . CAROTID ARTERY STENOSIS 10/29/2009  . AORTIC STENOSIS 09/17/2009  . Essential hypertension 11/17/2008  . DIASTOLIC HEART FAILURE, CHRONIC 11/17/2008  . Diabetes mellitus type II, non insulin dependent ([place]) 11/12/2008  . Other and unspecified hyperlipidemia 11/12/2008  . Anemia, chronic renal failure 11/12/2008  . Thrombocytopenia ([place]) 11/12/2008  . Anxiety state 11/12/2008   . Coronary atherosclerosis 11/12/2008  . UNSPEC COMBINED SYSTOLIC&DIASTOLIC HEART FAILURE 02/77/4128  . ESOPHAGEAL REFLUX 11/12/2008  . Chronic kidney disease (CKD), stage V ([place]) 11/12/2008  . LOW BACK PAIN, CHRONIC 11/12/2008  . INSOMNIA UNSPECIFIED 11/12/2008   Past Medical History:  Past Medical History:  Diagnosis Date  . Anemia    Pt is taking iron.   [name] Anxiety   . Arthritis   . Carotid stenosis    40-59% bilateral ICA stenosis in 2/12.  . Chronic low back pain   . CKD (chronic kidney disease)    Dr. Audie Clear at [facility]  . Coronary artery disease    Pt presented 2/10 to [facility] with NSTEMI and diastolic CHF exacerbation.  LHC was done  3/10 showing 99% pRCA stenosis and 80% calcified pLAD stenosis with L=>R collaterals.  Pt was referred  for CABG which was done by Dr. Prescott Gum with LIMA-LAD, SVG-RCA, SVG-OM.  . Diabetes mellitus   . Diabetic neuropathy ([place])   . Diastolic CHF ([facility])    Echo (2/10) showed EF 55-65%, mild LVH, diastolic dysfunction, mild AS with mean gradient 12 mmHg, PASP 43 mmHg.  Echo (2/12): EF 55-60%, mild LVH, mild AS (mean gradient 12), PA systolic pressure 32 mmHg.     [name] GERD (gastroesophageal reflux disease)   . Heart murmur   . Hyperlipidemia   . Hypertension   . Mild aortic stenosis    mean gradient 12 mmHg in 2/12.  . Myocardial infarction ([place])    "mild"  . Pneumonia   . PONV (postoperative nausea and vomiting)   . Stroke [facility])    " mild"  . Thrombocytopenia ([place])   . Unsteady gait    Past  Surgical History:  Past Surgical History:  Procedure Laterality Date  . AORTIC ARCH ANGIOGRAPHY N/A 10/18/2018   Procedure: AORTIC ARCH ANGIOGRAPHY;  Surgeon: Marty Heck, MD;  Location: [place] [facility];  Service: Cardiovascular;  Laterality: N/A;  . AV FISTULA PLACEMENT Left 01/30/2015   Procedure: Creation of a Radial Cephalic AV Fistula left wrist;  Surgeon: [name], MD;  Location: [place];  Service: Vascular;   Laterality: Left;  . AV FISTULA PLACEMENT Left 09/06/2018   Procedure: LEFT ARM ARTERIOVENOUS (AV) FISTULA CREATION;  Surgeon: Marty Heck, MD;  Location: [place];  Service: Vascular;  Laterality: Left;  . BACK SURGERY     multiple  . BREAST SURGERY     biopsy  . CARDIAC CATHETERIZATION    . CATARACT EXTRACTION W/ INTRAOCULAR LENS  IMPLANT, BILATERAL    . COLONOSCOPY Left 11/03/2016   Procedure: COLONOSCOPY;  Surgeon: [name], MD;  [facility];  Service: Endoscopy;  Laterality: Left;  . COLONOSCOPY W/ BIOPSIES AND POLYPECTOMY    . CORONARY ARTERY BYPASS GRAFT  09/2008   pt with NSTEMI and diastolic CHF exacerbation.  LHC was done  3/10 showing 99% pRCA stenosis and 80% calcified pLAD stenosis with L=>R collaterals.  Pt was referred  for CABG which was done by Dr. Prescott Gum with LIMA-LAD, SVG-RCA, SVG-OM.  [name] ESOPHAGOGASTRODUODENOSCOPY N/A 11/02/2016   Procedure: ESOPHAGOGASTRODUODENOSCOPY (EGD);  Surgeon: [name], MD;  Location: [facility] ENDOSCOPY;  Service: Endoscopy;  Laterality: N/A;  . GIVENS CAPSULE STUDY N/A 11/29/2016   Procedure: GIVENS CAPSULE STUDY;  Surgeon: [name], MD;  Location: [place];  Service: Endoscopy;  Laterality: N/A;  . LIGATION OF ARTERIOVENOUS  FISTULA Left 09/13/2018   Procedure: LIGATION OF LEFT ARTERIOVENOUS  FISTULA;  Surgeon: Waynetta Sandy, MD;  Location: [place];  Service: Vascular;  Laterality: Left;  . REVISON OF ARTERIOVENOUS FISTULA Left 07/02/2015   Procedure: REVISON OF LEFT RADIOCEPHALIC ARTERIOVENOUS FISTULA;  Surgeon: [name], MD;  Location: [place];  Service: Vascular;  Laterality: Left;  . RIGHT/LEFT HEART CATH AND CORONARY/GRAFT ANGIOGRAPHY N/A 05/18/2018   Procedure: RIGHT/LEFT HEART CATH AND CORONARY/GRAFT ANGIOGRAPHY;  Surgeon: Leonie Man, MD;  Location: [place] [facility];  Service: Cardiovascular;  Laterality: N/A;  . UPPER EXTREMITY ANGIOGRAPHY Left 10/18/2018   Procedure: UPPER EXTREMITY ANGIOGRAPHY;   Surgeon: Marty Heck, MD;  Location: [place] [facility];  Service: Cardiovascular;  Laterality: Left;    Assessment / Plan / Recommendation Clinical Impression   STINA GANE is a 78 year old right-handed female with history of hypertension, hyperlipidemia, diabetes mellitus, history of GI bleed,IBS,congestive diastolic heart failure, end-stage renal disease with hemodialysis Monday Wednesday and Fridays, CAD/CABG 2010, aortic stenosis, history of right thalamic CVA 2018, tobacco abuse. Per chart review patient lives with spouse.  Presented 07/23/2019 with right-sided weakness. Cranial CT scan showed no intracranial hemorrhage. Chronic small vessel ischemia and generalized volume loss noted. MRI of the brain areas of acute infarction throughout the distal left anterior cerebral artery territory. Therapy evaluations completed and patient was admitted for a comprehensive rehab program.  SLP evaluation was completed on 07/28/2019 with the following results:  Pt presents with mild cognitive deficits characterized by decreased recall of new information and decreased functional problem solving.  SLP administered the Cognistat and pt's scores on both problem solving and recall subtests fell in the range of severe impairment; however, her functional recall appeared much more mildly impaired with pt requiring overall mod assist for  recall of specific daily information.  Pt has complaints of cognitive "slowness" at this time as well as fatigue during today's assessment which could have further impacted her scores on formal assessment.  Pt also has left gaze preference and needed prompting to locate items to the right of midline during testing.  Unclear whether this is a primary visual inattention versus scanning difficulty resulting from postural limitations as pt's head and neck remained flexed to the left.  As a result, pt would benefit from skilled ST while inpatient in order to maximize functional  independence and reduce burden of care prior to discharge.  Anticipate that pt will need 24/7 supervision at discharge and may also benefit from [place] follow up at next level of care.  Will update d/c recommendations pending progress made while inpatient.    Skilled Therapeutic Interventions          Cognitive-linguistic evaluation completed with results and recommendations reviewed with family.     SLP Assessment  Patient will need skilled [place] [facility] during CIR admission    Recommendations  Patient destination: Home Follow up Recommendations: Home Health SLP;24 hour supervision/assistance Equipment Recommended: None recommended by SLP    SLP Frequency 3 to 5 out of 7 days   SLP Duration  SLP Intensity  SLP Treatment/Interventions 21-24 days  Minumum of 1-2 x/day, 30 to 90 minutes  Cognitive remediation/compensation;Cueing hierarchy;Functional tasks;Environmental controls;Internal/external aids;Patient/family education    Pain    Prior Functioning Cognitive/Linguistic Baseline: Within functional limits Type of Home: House  Lives With: Spouse Available Help at Discharge: Family;Available 24 hours/day Vocation: Retired  Programmer, systems Overall Cognitive Status: Impaired/Different from baseline Arousal/Alertness: Awake/alert Orientation Level: Oriented X4 Attention: Alternating Alternating Attention: Appears intact Memory: Impaired Memory Impairment: Decreased recall of new information Awareness: Appears intact Problem Solving: Impaired Problem Solving Impairment: Functional complex Executive Function: Organizing;Self Monitoring;Self Correcting Organizing: Impaired Organizing Impairment: Functional complex Self Monitoring: Impaired Self Monitoring Impairment: Functional complex Self Correcting: Impaired Self Correcting Impairment: Functional complex Safety/Judgment: Appears intact Comments: slowed processing, fatigued   Comprehension Auditory Comprehension Overall Auditory Comprehension: Appears within functional limits for tasks assessed Expression Expression Primary Mode of Expression: Verbal Verbal Expression Overall Verbal Expression: Appears within functional limits for tasks assessed Oral Motor Oral Motor/Sensory Function Overall Oral Motor/Sensory Function: Within functional limits Motor Speech Overall Motor Speech: Appears within functional limits for tasks assessed      Short Term Goals: Week 1: SLP Short Term Goal 1 (Week 1): Pt will use external aids to recall daily information with min assist verbal cues. SLP Short Term Goal 2 (Week 1): Pt will complete mildly complex tasks with min assist verbal cues for functional problem solving.  Refer to Care Plan for Long Term Goals  Recommendations for other services: Neuropsych  Discharge Criteria: Patient will be discharged from SLP if patient refuses treatment 3 consecutive times without medical reason, if treatment goals not met, if there is a change in medical status, if patient makes no progress towards goals or if patient is discharged from hospital.  The above assessment, treatment plan, treatment alternatives and goals were discussed and mutually agreed upon: by patient  Emilio Math 07/28/2019, 1:56 PM

## 2019-07-28 NOTE — Evaluation (Signed)
Physical Therapy Assessment and Plan  Patient Details  Name: ZANNA HAWN MRN: [id number] Date of Birth: August 26, 1940  PT Diagnosis: Abnormal posture, Cognitive deficits, Difficulty walking, Hemiplegia dominant, Hypotonia, Impaired sensation, Muscle weakness, Pain in body and Paralysis Rehab Potential: Fair ELOS: 21-28 days   Today's Date: 07/28/2019 PT Individual Time: 1100-1200 PT Individual Time Calculation (min): 60 min    Problem List:  Patient Active Problem List   Diagnosis Date Noted  . Small vessel cerebrovascular accident (CVA) ([place]) 07/27/2019  . Stage I pressure ulcer of sacral region 07/25/2019  . TIA (transient ischemic attack) 07/23/2019  . CVA (cerebral vascular accident) ([place]) 07/23/2019  . Volume overload 05/27/2018  . Dyspnea 05/27/2018  . Acute respiratory failure with hypoxia ([place]) 05/27/2018  . Acute respiratory distress 05/27/2018  . Acute encephalopathy 05/27/2018  . Aspiration pneumonia ([place]) 05/27/2018  . Pressure ulcer 05/19/2018  . Acute on chronic combined systolic and diastolic heart failure ([place]) 05/17/2018  . LBBB (left bundle branch block) 05/17/2018  . Aortic stenosis, moderate 05/17/2018  . NSTEMI (non-ST elevated myocardial infarction) ([place]) 05/16/2018  . Symptomatic anemia 05/15/2018  . Demand ischemia ([place]) 05/15/2018  . Protein-calorie malnutrition, severe 04/20/2018  . Pressure injury of skin 04/18/2018  . GI bleed 10/31/2016  . Blood loss anemia 10/31/2016  . AKI (acute kidney injury) ([place]) 10/31/2016  . Stroke ([place]) 09/26/2016  . ESRD (end stage renal disease) on dialysis ([place]) 06/23/2015  . Balance problem 12/22/2013  . Edema 12/28/2011  . CLAUDICATION 11/01/2010  . CAROTID ARTERY STENOSIS 10/29/2009  . AORTIC STENOSIS 09/17/2009  . Essential hypertension 11/17/2008  . DIASTOLIC HEART FAILURE, CHRONIC 11/17/2008  . Diabetes mellitus type II, non insulin dependent ([place]) 11/12/2008  . Other and unspecified hyperlipidemia  11/12/2008  . Anemia, chronic renal failure 11/12/2008  . Thrombocytopenia ([place]) 11/12/2008  . Anxiety state 11/12/2008  . Coronary atherosclerosis 11/12/2008  . UNSPEC COMBINED SYSTOLIC&DIASTOLIC HEART FAILURE 02/33/4356  . ESOPHAGEAL REFLUX 11/12/2008  . Chronic kidney disease (CKD), stage V ([place]) 11/12/2008  . LOW BACK PAIN, CHRONIC 11/12/2008  . INSOMNIA UNSPECIFIED 11/12/2008    Past Medical History:  Past Medical History:  Diagnosis Date  . Anemia    Pt is taking iron.   [name] Anxiety   . Arthritis   . Carotid stenosis    40-59% bilateral ICA stenosis in 2/12.  . Chronic low back pain   . CKD (chronic kidney disease)    Dr. Audie Clear at [facility]  . Coronary artery disease    Pt presented 2/10 to [facility] with NSTEMI and diastolic CHF exacerbation.  LHC was done  3/10 showing 99% pRCA stenosis and 80% calcified pLAD stenosis with L=>R collaterals.  Pt was referred  for CABG which was done by Dr. Prescott Gum with LIMA-LAD, SVG-RCA, SVG-OM.  . Diabetes mellitus   . Diabetic neuropathy ([place])   . Diastolic CHF ([facility])    Echo (2/10) showed EF 55-65%, mild LVH, diastolic dysfunction, mild AS with mean gradient 12 mmHg, PASP 43 mmHg.  Echo (2/12): EF 55-60%, mild LVH, mild AS (mean gradient 12), PA systolic pressure 32 mmHg.     [name] GERD (gastroesophageal reflux disease)   . Heart murmur   . Hyperlipidemia   . Hypertension   . Mild aortic stenosis    mean gradient 12 mmHg in 2/12.  . Myocardial infarction ([place])    "mild"  . Pneumonia   . PONV (postoperative nausea and vomiting)   . Stroke [facility])    "  mild"  . Thrombocytopenia ([place])   . Unsteady gait    Past Surgical History:  Past Surgical History:  Procedure Laterality Date  . AORTIC ARCH ANGIOGRAPHY N/A 10/18/2018   Procedure: AORTIC ARCH ANGIOGRAPHY;  Surgeon: Marty Heck, MD;  Location: [place] [facility];  Service: Cardiovascular;  Laterality: N/A;  . AV FISTULA PLACEMENT Left 01/30/2015   Procedure:  Creation of a Radial Cephalic AV Fistula left wrist;  Surgeon: [name], MD;  Location: [place];  Service: Vascular;  Laterality: Left;  . AV FISTULA PLACEMENT Left 09/06/2018   Procedure: LEFT ARM ARTERIOVENOUS (AV) FISTULA CREATION;  Surgeon: Marty Heck, MD;  Location: [place];  Service: Vascular;  Laterality: Left;  . BACK SURGERY     multiple  . BREAST SURGERY     biopsy  . CARDIAC CATHETERIZATION    . CATARACT EXTRACTION W/ INTRAOCULAR LENS  IMPLANT, BILATERAL    . COLONOSCOPY Left 11/03/2016   Procedure: COLONOSCOPY;  Surgeon: [name], MD;  Location: [facility];  Service: Endoscopy;  Laterality: Left;  . COLONOSCOPY W/ BIOPSIES AND POLYPECTOMY    . CORONARY ARTERY BYPASS GRAFT  09/2008   pt with NSTEMI and diastolic CHF exacerbation.  LHC was done  3/10 showing 99% pRCA stenosis and 80% calcified pLAD stenosis with L=>R collaterals.  Pt was referred  for CABG which was done by Dr. Prescott Gum with LIMA-LAD, SVG-RCA, SVG-OM.  [name] ESOPHAGOGASTRODUODENOSCOPY N/A 11/02/2016   Procedure: ESOPHAGOGASTRODUODENOSCOPY (EGD);  Surgeon: [name], MD;  Location: [facility] ENDOSCOPY;  Service: Endoscopy;  Laterality: N/A;  . GIVENS CAPSULE STUDY N/A 11/29/2016   Procedure: GIVENS CAPSULE STUDY;  Surgeon: [name], MD;  Location: [place];  Service: Endoscopy;  Laterality: N/A;  . LIGATION OF ARTERIOVENOUS  FISTULA Left 09/13/2018   Procedure: LIGATION OF LEFT ARTERIOVENOUS  FISTULA;  Surgeon: Waynetta Sandy, MD;  Location: [place];  Service: Vascular;  Laterality: Left;  . REVISON OF ARTERIOVENOUS FISTULA Left 07/02/2015   Procedure: REVISON OF LEFT RADIOCEPHALIC ARTERIOVENOUS FISTULA;  Surgeon: [name], MD;  Location: [place];  Service: Vascular;  Laterality: Left;  . RIGHT/LEFT HEART CATH AND CORONARY/GRAFT ANGIOGRAPHY N/A 05/18/2018   Procedure: RIGHT/LEFT HEART CATH AND CORONARY/GRAFT ANGIOGRAPHY;  Surgeon: Leonie Man, MD;  Location: [place] [facility];  Service:  Cardiovascular;  Laterality: N/A;  . UPPER EXTREMITY ANGIOGRAPHY Left 10/18/2018   Procedure: UPPER EXTREMITY ANGIOGRAPHY;  Surgeon: Marty Heck, MD;  Location: [place] [facility];  Service: Cardiovascular;  Laterality: Left;    Assessment & Plan Clinical Impression: Patient is a 78 year old right-handed female with history of hypertension, hyperlipidemia, diabetes mellitus, history of GI bleed,IBS,congestive diastolic heart failure, end-stage renal disease with hemodialysis Monday Wednesday and Fridays, CAD/CABG 2010, aortic stenosis, history of right thalamic CVA 2018, tobacco abuse. Per chart review patient lives with spouse. 1 level home. Patient did use a Rollator when going to dialysis. Presented 07/23/2019 with right-sided weakness. Cranial CT scan showed no intracranial hemorrhage. Chronic small vessel ischemia and generalized volume loss noted. Patient did not receive TPA. CT angiogram negative for large vessel occlusion however positive for significant intracranial and extracranial atherosclerotic disease. MRI of the brain areas of acute infarction throughout the distal left anterior cerebral artery territory. Echocardiogram with ejection fraction of 30% without embolihowever it did show catheter at right atrium with oscillating density considering vegetation versus thrombus discussed with neurology services awaiting plan for TEE... Admission chemistries with creatinine 4.26, SARS coronavirus negative, hemoglobin 12.6,  platelet count 125,000. Neurology follow-up maintained on aspirin and Plavix for CVA prophylaxis x3 weeks then aspirin alone. Hemodialysis ongoing as per renal services.  Patient transferred to CIR on 07/27/2019 .   Patient currently requires total +2 with mobility secondary to muscle weakness, muscle joint tightness and muscle paralysis, decreased cardiorespiratoy endurance, impaired timing and sequencing and abnormal tone, decreased midline orientation and  decreased attention to right, decreased attention, decreased awareness and decreased problem solving and decreased sitting balance, decreased standing balance, decreased postural control, hemiplegia and decreased balance strategies.  Prior to hospitalization, patient was modified independent  with mobility and lived with Spouse in a House home.  Home access is one step to porch and one step in to houseRamped entrance(in back and husband plan to put in front also).  Patient will benefit from skilled PT intervention to maximize safe functional mobility, minimize fall risk and decrease caregiver burden for planned discharge home with 24 hour assist.  Anticipate patient will benefit from follow up [facility] at discharge.  PT - End of Session Endurance Deficit: Yes Endurance Deficit Description: requires frequent rest breaks PT Assessment Rehab Potential (ACUTE/IP ONLY): Fair PT Barriers to Discharge: Decreased caregiver support;Hemodialysis PT Barriers to Discharge Comments: husband is limited physically; son available PRN but is also primary caregiver for his wife with stage IV cancer PT Patient demonstrates impairments in the following area(s): Balance;Endurance;Motor;Nutrition;Pain;Perception;Safety;Skin Integrity PT Transfers Functional Problem(s): Bed to Chair;Bed Mobility;Car;Furniture PT Locomotion Functional Problem(s): Ambulation;Wheelchair Mobility;Stairs PT Plan PT Intensity: Minimum of 1-2 x/day ,45 to 90 minutes PT Frequency: 5 out of 7 days PT Duration Estimated Length of Stay: 21-28 days PT Treatment/Interventions: Ambulation/gait training;Balance/vestibular training;Cognitive remediation/compensation;Community reintegration;Discharge planning;Disease management/prevention;DME/adaptive equipment instruction;Functional mobility training;Functional electrical stimulation;Neuromuscular re-education;Pain management;Patient/family education;Psychosocial support;Skin care/wound  management;Splinting/orthotics;Stair training;Therapeutic Activities;Therapeutic Exercise;UE/LE Strength taining/ROM;UE/LE Coordination activities;Visual/perceptual remediation/compensation;Wheelchair propulsion/positioning PT Transfers Anticipated Outcome(s): min assist w/c level; mod assist car PT Locomotion Anticipated Outcome(s): supervision w/c mobility; mod assist gait PT Recommendation Recommendations for Other Services: Neuropsych consult;Therapeutic Recreation consult Therapeutic Recreation Interventions: Stress management;Pet therapy Follow Up Recommendations: Home health PT;24 hour supervision/assistance Patient destination: Home Equipment Recommended: Wheelchair (measurements);Wheelchair cushion (measurements);To be determined  Skilled Therapeutic Intervention Evaluation completed (see details above and below) with education on PT POC and goals and individual treatment initiated with focus on NMR to address bed mobility re-training, postural control and balance re-training seated EOB with focus on core activation, reorientation to midline, and body awareness, initiated slideboard transfer OOB to TIS w/c with total +2 assist due to impaired postural control, R hemiplegia, and impaired attention. Pt with tendency for LOB to the L and posterior/anterior due to poor trunk control and decreased initiation to be able to self correct. R inattention and L gaze preference noted but able to attend with cues to R. Positioned in TIS w/ RUE supported on pillow and education provided to her and husband in regards to importance of RUE positioning and support due to weakness and risk for subluxation. Oriented patient to rehab unit and available equipment to work on mobility in future sessions - pt lethargic and at times falling asleep due to fatigue but very receptive to all education.    PT Evaluation Precautions/Restrictions Precautions Precautions: Fall Precaution Comments: mild Rt inattention; R  hemiplegia Restrictions Weight Bearing Restrictions: No Pain Reports soreness all over her body. Repositioned throughout. eduation provided on importance of positioning of RUE/RLE Home Living/Prior Functioning Home Living Available Help at Discharge: Family;Available 24 hours/day Type of Home: House Home Access: Ramped entrance(in back and  husband plan to put in front also) Entrance Stairs-Number of Steps: one step to porch and one step in to house Entrance Stairs-Rails: None Home Layout: One level Bathroom Shower/Tub: Tub/shower unit(typically sponge bathes due to HD cath) [employer] Toilet: Standard  Lives With: Spouse Prior Function Level of Independence: Independent with basic ADLs;Independent with homemaking with ambulation(used Rollator when going to HD, husband would assist with washing her back)  Able to Take Stairs?: Yes Driving: No Vocation: Retired Leisure: Hobbies-yes (Comment) Comments: likes to watch reality TV and play with her poodle [name] Vision/Perception  Vision - Assessment Ocular Range of Motion: Within Functional Limits Alignment/Gaze Preference: Chin down;Head turned;Gaze left Saccades: Undershoots;Decreased speed of saccadic movement;Additional eye shifts occurred during testing Additional Comments: pt with Lt gaze preference, able to turn head to locate stimulus in Rt with prompting Perception Perception: Impaired Inattention/Neglect: Does not attend to right visual field;Does not attend to right side of body  Cognition Overall Cognitive Status: Impaired/Different from baseline Arousal/Alertness: Awake/alert Orientation Level: Oriented X4 Attention: Selective Selective Attention: Appears intact Memory: Appears intact Immediate Memory Recall: Sock;Blue;Bed Memory Recall Sock: Without Cue Memory Recall Blue: Without Cue Memory Recall Bed: Without Cue Awareness: Impaired Problem Solving: Impaired Problem Solving Impairment: Verbal complex;Functional  basic Safety/Judgment: Appears intact Comments: very delayed processing Sensation Sensation Light Touch: Appears Intact Proprioception: Appears Intact Coordination Gross Motor Movements are Fluid and Coordinated: No Fine Motor Movements are Fluid and Coordinated: No Coordination and Movement Description: flaccid RUE/RLE Finger Nose Finger Test: flaccid RUE Motor  Motor Motor: Hemiplegia;Abnormal tone;Abnormal postural alignment and control Motor - Skilled Clinical Observations: poor postural control; decreased orientation to midline; hypotonic RUE/RLE  Mobility Bed Mobility Bed Mobility: Rolling Left;Supine to Sit Rolling Left: Maximal Assistance - Patient 25-49% Supine to Sit: 2 Helpers Transfers Transfers: Sit to Stand;Lateral/Scoot Transfers Sit to Stand: 2 Helpers Lateral/Scoot Transfers: 2 Press photographer (Assistive device): (slideboard) Artist / Additional Locomotion Stairs: No Architect: (dependent in TIS w/c)  Trunk/Postural Assessment  Cervical Assessment Cervical Assessment: Exceptions to WFL(decreased extension; unable to attain neutral) Thoracic Assessment Thoracic Assessment: Exceptions to WFL(significant kyphosis) Lumbar Assessment Lumbar Assessment: Exceptions to [facility] Postural Control Postural Control: Deficits on evaluation Trunk Control: up to max assist to maintain seated Righting Reactions: delayed and inadequate Protective Responses: delayed and inadequate  Balance Balance Balance Assessed: Yes Static Sitting Balance Static Sitting - Level of Assistance: 4: Min assist;2: Max assist Dynamic Sitting Balance Dynamic Sitting - Level of Assistance: 1: +1 Total assist Extremity Assessment  RUE Assessment RUE Assessment: Exceptions to [facility] Passive Range of Motion (PROM) Comments: tolerates WNL without any pain Active Range of Motion (AROM) Comments: flaccid General Strength Comments: flaccid LUE  Assessment LUE Assessment: Within Functional Limits Active Range of Motion (AROM) Comments: WFL General Strength Comments: grossly 4/5 RLE Assessment RLE Assessment: Exceptions to [facility] Passive Range of Motion (PROM) Comments: WNL General Strength Comments: 0/5- no active movement noted RLE Tone RLE Tone: Flaccid LLE Assessment LLE Assessment: Exceptions to [facility] General Strength Comments: grossly 3+ to 4-/5    Refer to Care Plan for Long Term Goals  Recommendations for other services: Neuropsych and Therapeutic Recreation  Pet therapy and Stress management  Discharge Criteria: Patient will be discharged from PT if patient refuses treatment 3 consecutive times without medical reason, if treatment goals not met, if there is a change in medical status, if patient makes no progress towards goals or if patient is discharged from hospital.  The above assessment, treatment  plan, treatment alternatives and goals were discussed and mutually agreed upon: by patient and by family  Juanna Cao, PT, DPT, CBIS  07/28/2019, 1:02 PM

## 2019-07-28 NOTE — Progress Notes (Signed)
Orthopedic Tech Progress Note Patient Details:  [name] 1941/04/04 [id number] [facility] for prafo and WHO. Patient ID: [name], female   DOB: 1941-04-15, 78 y.o.   MRN: [id number]   [name] T 07/28/2019, 10:32 AM

## 2019-07-28 NOTE — Progress Notes (Signed)
[name] Progress Note   Dialysis Orders: MWF at [facility] 4hr, 300/600, EDW 51.5kg, 2K/2.25Ca, TDC, UFP #4, heparin 1000 - Mircera 200 q 2 weeks Right IJ TDC - failed AVF  Assessment/Plan: 1. Acute CVA: LACAdistribution, presumed embolic.Echo with reduced EF Mod AS ? thrombus vs vet - for TEE planned for 12/16. PersistentR hemiparesis, speech intact.Statin started. Adm to CIR 12/12 2. ESRD:Continue HD per MWF sched - HD12/3 - reduce time to 3.5 hour due to high inpatient HD load. Aiming toavoid intradialytic hypotension. No heparin. Dr. Jonnie Finner spoke w/ Dr Posey Pronto , cath placed CK Vasc in September 2020. Regarding echo density attached to HD cath in R atrium, this is not unusual and would not require removal of cath.  3. Hypertension/volume:Permissive HTN initially ->BP variable net UF 1.5 Friday 12/11- post bed wt 52.9 4. Anemia:Hgb11.7 stable - no ESA needed - prev on high dose  5. Metabolic bone disease:Ca/Phos to goal. Continue home Sevelamer as binder.-un beknownst to her dialysis unit, she tells me she is taking home  calcitriol -  iPTH in goal - will d/c calcitriol and switch to TIW hectorol - home calcitriol should not be resumed at d/c 6. T2DM 7.  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 Nutrition - high risk for weight loss - intake marginal at present - nepro added 9. Fever 100.4 last evening - no new labs - trend - feels ok now   Jeanne Jacobson, PA-C [place] [phone] 07/28/2019,10:36 AM  LOS: 1 day   Subjective:   Ate better this am.  Husband of 50 years   Objective Vitals:   07/27/19 1830 07/27/19 2000 07/27/19 2321 07/28/19 0452  BP: 113/66 102/60  110/62  Pulse: (!) 110 (!) 103 89 93  Resp: 17 17  18   Temp: 98.2 F (36.8 C) (!) 100.4 F (38 C) 98.6 F (37 C) 98 F (36.7 C)  TempSrc: Oral Oral    SpO2: 97% 97% 98% 100%   Weight: 53 kg   48.9 kg  Height: 5\' 8"  (1.727 m)      Physical Exam General: very frail, thin NAD Heart: RRR 3/6 murmur Lungs: no rales Abdomen: soft NT + BS Extremities: no LE edema  Dialysis Access: right IJ [facility]  Neuro: right hemiplegia   Additional Objective Labs: Basic Metabolic Panel: Recent Labs  Lab 07/24/19 1250 07/26/19 1158 07/26/19 1427  NA 133* 130* 133*  K 5.4* 4.7 4.7  CL 96* 94* 94*  CO2 21* 23 25  GLUCOSE 173* 242* 192*  BUN 45* 58* 59*  CREATININE 6.52* 6.86* 6.68*  CALCIUM 9.8 9.1 9.4  PHOS 3.8 2.5 2.4*   Liver Function Tests: Recent Labs  Lab 07/23/19 0038 07/23/19 0420 07/24/19 1250 07/26/19 1158 07/26/19 1427  AST 28 15  --   --   --   ALT 20 11  --   --   --   ALKPHOS 98 52  --   --   --   BILITOT 0.9 0.2*  --   --   --   PROT 7.1 4.0*  --   --   --   ALBUMIN 3.2* 1.9* 3.1* 2.7* 2.8*   No results for input(s): LIPASE, AMYLASE in the last 168 hours. CBC: Recent Labs  Lab 07/23/19 0038 07/23/19 0420 07/24/19 0359 07/26/19 1158 07/26/19 1427  WBC 9.9  6.9 10.4 10.4 10.8*  NEUTROABS 7.5  --   --   --   --   HGB 12.6 7.8* 11.8* 11.7* 11.7*  HCT 40.8 26.0* 38.4 36.4 36.5  MCV 112.7* 115.0* 112.0* 108.3* 109.0*  PLT 125* 90* 128* 112* 120*   Blood Culture    Component Value Date/Time   SDES BLOOD LEFT ARM 07/26/2019 1729   SDES BLOOD RIGHT ARM 07/26/2019 1729   SPECREQUEST  07/26/2019 1729    BOTTLES DRAWN AEROBIC ONLY Blood Culture adequate volume   SPECREQUEST  07/26/2019 1729    BOTTLES DRAWN AEROBIC ONLY Blood Culture adequate volume   CULT  07/26/2019 1729    NO GROWTH 2 DAYS Performed at [facility], [place] [address], [place], [place] [address]    CULT  07/26/2019 1729    NO GROWTH 2 DAYS Performed at [place] [address], [place], [place] [address]    REPTSTATUS PENDING 07/26/2019 1729   REPTSTATUS PENDING 07/26/2019 1729    Cardiac Enzymes: No results for input(s): CKTOTAL, CKMB, CKMBINDEX,  TROPONINI in the last 168 hours. CBG: Recent Labs  Lab 07/27/19 0642 07/27/19 1126 07/27/19 1618 07/27/19 2101 07/28/19 0641  GLUCAP 119* 246* 261* 162* 159*   Iron Studies: No results for input(s): IRON, TIBC, TRANSFERRIN, FERRITIN in the last 72 hours. Lab Results  Component Value Date   INR 1.0 07/23/2019   INR 1.07 04/18/2018   INR 1.4 10/16/2008   Studies/Results: No results found. Medications:  . ALPRAZolam  1 mg Oral QHS  . aspirin EC  81 mg Oral Daily  . Chlorhexidine Gluconate Cloth  6 each Topical BID  . clopidogrel  75 mg Oral Daily  . folic acid  1 mg Oral Daily  . insulin aspart  0-9 Units Subcutaneous TID WC  . lubiprostone  24 mcg Oral Q breakfast  . [START ON 07/29/2019] midodrine  10 mg Oral Q M,W,F-HD  . multivitamin  1 tablet Oral QHS  . rosuvastatin  20 mg Oral q1800  . sevelamer carbonate  1,600 mg Oral TID WC  . vitamin B-12  500 mcg Oral Daily

## 2019-07-29 ENCOUNTER — Inpatient Hospital Stay (HOSPITAL_COMMUNITY): Payer: Medicare HMO | Admitting: Occupational Therapy

## 2019-07-29 ENCOUNTER — Inpatient Hospital Stay (HOSPITAL_COMMUNITY): Payer: Medicare HMO

## 2019-07-29 ENCOUNTER — Other Ambulatory Visit: Payer: Self-pay

## 2019-07-29 DIAGNOSIS — I639 Cerebral infarction, unspecified: Secondary | ICD-10-CM

## 2019-07-29 LAB — RENAL FUNCTION PANEL
Albumin: 2.8 g/dL — ABNORMAL LOW (ref 3.5–5.0)
Anion gap: 17 — ABNORMAL HIGH (ref 5–15)
BUN: 78 mg/dL — ABNORMAL HIGH (ref 8–23)
CO2: 18 mmol/L — ABNORMAL LOW (ref 22–32)
Calcium: 9.1 mg/dL (ref 8.9–10.3)
Chloride: 95 mmol/L — ABNORMAL LOW (ref 98–111)
Creatinine, Ser: 8.14 mg/dL — ABNORMAL HIGH (ref 0.44–1.00)
GFR calc Af Amer: 5 mL/min — ABNORMAL LOW (ref >60)
GFR calc non Af Amer: 4 mL/min — ABNORMAL LOW (ref >60)
Glucose, Bld: 152 mg/dL — ABNORMAL HIGH (ref 70–99)
Phosphorus: 4.2 mg/dL (ref 2.5–4.6)
Potassium: 5.1 mmol/L (ref 3.5–5.1)
Sodium: 130 mmol/L — ABNORMAL LOW (ref 135–145)

## 2019-07-29 LAB — CBC
HCT: 37.4 % (ref 36.0–46.0)
Hemoglobin: 11.7 g/dL — ABNORMAL LOW (ref 12.0–15.0)
MCH: 34.1 pg — ABNORMAL HIGH (ref 26.0–34.0)
MCHC: 31.3 g/dL (ref 30.0–36.0)
MCV: 109 fL — ABNORMAL HIGH (ref 80.0–100.0)
Platelets: DECREASED 10*3/uL (ref 150–400)
RBC: 3.43 MIL/uL — ABNORMAL LOW (ref 3.87–5.11)
RDW: 17.2 % — ABNORMAL HIGH (ref 11.5–15.5)
WBC: 11.2 10*3/uL — ABNORMAL HIGH (ref 4.0–10.5)
nRBC: 0 % (ref 0.0–0.2)

## 2019-07-29 LAB — GLUCOSE, CAPILLARY
Glucose-Capillary: 124 mg/dL — ABNORMAL HIGH (ref 70–99)
Glucose-Capillary: 175 mg/dL — ABNORMAL HIGH (ref 70–99)
Glucose-Capillary: 125 mg/dL — ABNORMAL HIGH (ref 70–99)
Glucose-Capillary: 150 mg/dL — ABNORMAL HIGH (ref 70–99)

## 2019-07-29 MED ORDER — HEPARIN SODIUM (PORCINE) 1000 UNIT/ML IJ SOLN
INTRAMUSCULAR | Status: AC
  Administered 2019-07-29: 1000 [IU]
  Filled 2019-07-29: qty 4

## 2019-07-29 MED ORDER — PRO-STAT SUGAR FREE PO LIQD
30.0000 mL | Freq: Two times a day (BID) | ORAL | Status: DC
  Administered 2019-07-29 – 2019-08-24 (×46): 30 mL via ORAL
  Filled 2019-07-29 (×52): qty 30

## 2019-07-29 MED ORDER — NEPRO/CARBSTEADY PO LIQD
237.0000 mL | Freq: Two times a day (BID) | ORAL | Status: DC
  Administered 2019-07-30 – 2019-08-04 (×9): 237 mL via ORAL

## 2019-07-29 MED ORDER — MIDODRINE HCL 5 MG PO TABS
ORAL_TABLET | ORAL | Status: AC
  Filled 2019-07-29: qty 2

## 2019-07-29 NOTE — Progress Notes (Signed)
Social Work Assessment and Plan   Patient Details  Name: [name] MRN: [id number] Date of Birth: January 18, 1941  Today's Date: 07/29/2019  Problem List:  Patient Active Problem List   Diagnosis Date Noted  . Small vessel cerebrovascular accident (CVA) ([place]) 07/27/2019  . Stage I pressure ulcer of sacral region 07/25/2019  . TIA (transient ischemic attack) 07/23/2019  . CVA (cerebral vascular accident) ([place]) 07/23/2019  . Volume overload 05/27/2018  . Dyspnea 05/27/2018  . Acute respiratory failure with hypoxia ([place]) 05/27/2018  . Acute respiratory distress 05/27/2018  . Acute encephalopathy 05/27/2018  . Aspiration pneumonia ([place]) 05/27/2018  . Pressure ulcer 05/19/2018  . Acute on chronic combined systolic and diastolic heart failure ([place]) 05/17/2018  . LBBB (left bundle branch block) 05/17/2018  . Aortic stenosis, moderate 05/17/2018  . NSTEMI (non-ST elevated myocardial infarction) ([place]) 05/16/2018  . Symptomatic anemia 05/15/2018  . Demand ischemia ([place]) 05/15/2018  . Protein-calorie malnutrition, severe 04/20/2018  . Pressure injury of skin 04/18/2018  . GI bleed 10/31/2016  . Blood loss anemia 10/31/2016  . AKI (acute kidney injury) ([place]) 10/31/2016  . Stroke ([place]) 09/26/2016  . ESRD (end stage renal disease) on dialysis ([place]) 06/23/2015  . Balance problem 12/22/2013  . Edema 12/28/2011  . CLAUDICATION 11/01/2010  . CAROTID ARTERY STENOSIS 10/29/2009  . AORTIC STENOSIS 09/17/2009  . Essential hypertension 11/17/2008  . DIASTOLIC HEART FAILURE, CHRONIC 11/17/2008  . Diabetes mellitus type II, non insulin dependent ([place]) 11/12/2008  . Other and unspecified hyperlipidemia 11/12/2008  . Anemia, chronic renal failure 11/12/2008  . Thrombocytopenia ([place]) 11/12/2008  . Anxiety state 11/12/2008  . Coronary atherosclerosis 11/12/2008  . UNSPEC COMBINED SYSTOLIC&DIASTOLIC HEART FAILURE 0000000  . ESOPHAGEAL REFLUX 11/12/2008  . Chronic kidney disease (CKD), stage V  ([place]) 11/12/2008  . LOW BACK PAIN, CHRONIC 11/12/2008  . INSOMNIA UNSPECIFIED 11/12/2008   Past Medical History:  Past Medical History:  Diagnosis Date  . Anemia    Pt is taking iron.   [name] Anxiety   . Arthritis   . Carotid stenosis    40-59% bilateral ICA stenosis in 2/12.  . Chronic low back pain   . CKD (chronic kidney disease)    Dr. Audie Clear at [facility]  . Coronary artery disease    Pt presented 2/10 to [facility] with NSTEMI and diastolic CHF exacerbation.  LHC was done  3/10 showing 99% pRCA stenosis and 80% calcified pLAD stenosis with L=>R collaterals.  Pt was referred  for CABG which was done by Dr. Prescott Gum with LIMA-LAD, SVG-RCA, SVG-OM.  . Diabetes mellitus   . Diabetic neuropathy ([place])   . Diastolic CHF ([facility])    Echo (2/10) showed EF 55-65%, mild LVH, diastolic dysfunction, mild AS with mean gradient 12 mmHg, PASP 43 mmHg.  Echo (2/12): EF 55-60%, mild LVH, mild AS (mean gradient 12), PA systolic pressure 32 mmHg.     [name] GERD (gastroesophageal reflux disease)   . Heart murmur   . Hyperlipidemia   . Hypertension   . Mild aortic stenosis    mean gradient 12 mmHg in 2/12.  . Myocardial infarction ([place])    "mild"  . Pneumonia   . PONV (postoperative nausea and vomiting)   . Stroke [facility])    " mild"  . Thrombocytopenia ([place])   . Unsteady gait    Past Surgical History:  Past Surgical History:  Procedure Laterality Date  . AORTIC ARCH ANGIOGRAPHY N/A 10/18/2018   Procedure: AORTIC ARCH ANGIOGRAPHY;  Surgeon: Carlis Abbott,  Gwenyth Allegra, MD;  Location: [place] [facility];  Service: Cardiovascular;  Laterality: N/A;  . AV FISTULA PLACEMENT Left 01/30/2015   Procedure: Creation of a Radial Cephalic AV Fistula left wrist;  Surgeon: [name], MD;  Location: [place];  Service: Vascular;  Laterality: Left;  . AV FISTULA PLACEMENT Left 09/06/2018   Procedure: LEFT ARM ARTERIOVENOUS (AV) FISTULA CREATION;  Surgeon: Marty Heck, MD;  Location: [place];  Service:  Vascular;  Laterality: Left;  . BACK SURGERY     multiple  . BREAST SURGERY     biopsy  . CARDIAC CATHETERIZATION    . CATARACT EXTRACTION W/ INTRAOCULAR LENS  IMPLANT, BILATERAL    . COLONOSCOPY Left 11/03/2016   Procedure: COLONOSCOPY;  Surgeon: [name], MD;  Location: [facility] ENDOSCOPY;  Service: Endoscopy;  Laterality: Left;  . COLONOSCOPY W/ BIOPSIES AND POLYPECTOMY    . CORONARY ARTERY BYPASS GRAFT  09/2008   pt with NSTEMI and diastolic CHF exacerbation.  LHC was done  3/10 showing 99% pRCA stenosis and 80% calcified pLAD stenosis with L=>R collaterals.  Pt was referred  for CABG which was done by Dr. Prescott Gum with LIMA-LAD, SVG-RCA, SVG-OM.  [name] ESOPHAGOGASTRODUODENOSCOPY N/A 11/02/2016   Procedure: ESOPHAGOGASTRODUODENOSCOPY (EGD);  Surgeon: [name], MD;  Location: [facility] ENDOSCOPY;  Service: Endoscopy;  Laterality: N/A;  . GIVENS CAPSULE STUDY N/A 11/29/2016   Procedure: GIVENS CAPSULE STUDY;  Surgeon: [name], MD;  Location: [place];  Service: Endoscopy;  Laterality: N/A;  . LIGATION OF ARTERIOVENOUS  FISTULA Left 09/13/2018   Procedure: LIGATION OF LEFT ARTERIOVENOUS  FISTULA;  Surgeon: Waynetta Sandy, MD;  Location: [place];  Service: Vascular;  Laterality: Left;  . REVISON OF ARTERIOVENOUS FISTULA Left 07/02/2015   Procedure: REVISON OF LEFT RADIOCEPHALIC ARTERIOVENOUS FISTULA;  Surgeon: [name], MD;  Location: [place];  Service: Vascular;  Laterality: Left;  . RIGHT/LEFT HEART CATH AND CORONARY/GRAFT ANGIOGRAPHY N/A 05/18/2018   Procedure: RIGHT/LEFT HEART CATH AND CORONARY/GRAFT ANGIOGRAPHY;  Surgeon: Leonie Man, MD;  Location: [place] [facility];  Service: Cardiovascular;  Laterality: N/A;  . UPPER EXTREMITY ANGIOGRAPHY Left 10/18/2018   Procedure: UPPER EXTREMITY ANGIOGRAPHY;  Surgeon: Marty Heck, MD;  Location: [place] [facility];  Service: Cardiovascular;  Laterality: Left;   Social History:  reports that she has quit smoking. Her smoking use  included cigarettes. She has never used smokeless tobacco. She reports current alcohol use. She reports that she does not use drugs.  Family / Support Systems Marital Status: Married Patient Roles: Spouse, Parent Spouse/Significant Other: [name] [phone] Children: Shea-son [phone] Other Supports: Friends and church members Anticipated Caregiver: husband and son Ability/Limitations of Caregiver: husband is 48 yo but in good health and son owns his [place] business-so flexible Caregiver Availability: 24/7 Family Dynamics: Close small family unit, have always done for one another and did whatever was needed. They have good family supports who will pull together and provide what pt needs. Since she would for them if they needed it  Social History Preferred language: English Religion: Christian Cultural Background: No issues Education: HS Read: Yes Write: Yes Employment Status: Disabled Public relations account executive Issues: No issues Guardian/Conservator: none-accoring to MD pt is capable of making her own decisions while here, husband plans ot be here daily to provide support and see her progress.   Abuse/Neglect Abuse/Neglect Assessment Can Be Completed: Yes Physical Abuse: Denies Verbal Abuse: Denies Sexual Abuse: Denies Exploitation of patient/patient's resources: Denies Self-Neglect:  Denies  Emotional Status Pt's affect, behavior and adjustment status: Pt is exhausted from two therapies, she reports she is very weak and eneds to get back her strength while here. She is hoping she can get back to her prior level ambulating with a rollator and husband helped with soem self care tasks. She does not want to place too much on her husband. Recent Psychosocial Issues: other health issues-was managing prior to this. Psychiatric History: History of anxiety takes medications for this and finds them helpful, may benefit from seeing neuro-psych while here when stronger and  able. She has had to deal with many health issues throughout her life. Substance Abuse History: No issues  Patient / Family Perceptions, Expectations & Goals Pt/Family understanding of illness & functional limitations: Pt and husband can explain her stroke and weakness and deficits. Both have spoken with the MD and feel they have a good understanding of her condition and treatment plan going forward to help her recover from this. Husband finds it helpful to be here and talk with the staff caring for wife. Premorbid pt/family roles/activities: Wife, Mom, retiree, HD pt, church member, freind, Social research officer, government Anticipated changes in roles/activities/participation: resume Pt/family expectations/goals: Pt states: " I want to get back to where I was before walking with a walker, I don't want to burden my husband and son."  Husband states: " I will do what she needs me to do, she would me."  [employer]: Other (Comment)(HD M,W,F) Premorbid Home Care/DME Agencies: Other (Comment)(used rollator outside the home) Transportation available at discharge: Husband was transporting to HD and other appointments Resource referrals recommended: Neuropsychology, Support group (specify)  Discharge Planning Living Arrangements: Spouse/significant other Support Systems: Spouse/significant other, Children, Friends/neighbors, Church/faith community Type of Residence: Private residence Insurance Resources: Multimedia programmer (specify)([employer] Medicare) Financial Resources: Social Security, Family Support Financial Screen Referred: No Living Expenses: Own Money Management: Spouse Does the patient have any problems obtaining your medications?: No Home Management: Husband pt helped some with cooking Patient/Family Preliminary Plans: Return home with husband and son to assist. Husband is hoping she can get to the levle where he can provide her care and not have their son coem in too much. Both husband and  son are willing to do what they need to do to assist pt. Social Work Anticipated Follow Up Needs: HH/OP, Support Group  Clinical Impression Pleasant and willing pt to work and participate in therapies to recover from this stroke. Her son and husband are very involved and will be assisting at discharge from [facility]. Aware insurance will not cover NH if feels this is needed. Both have no plans to place pt in a NH from here. Will work on discharge needs and have see neuro-psych when appropriate. Husband aware wife will require assist when discharges from here.  Elease Hashimoto 07/29/2019, 11:06 AM

## 2019-07-29 NOTE — Progress Notes (Signed)
Signed    Expand AllCollapse All   Show:Clear all [x] Manual[x] Template[] Copied  Added by: [x] Angiulli, Lavon Paganini, PA-C[x] Kirsteins, Luanna Salk, MD  [] Hover for details          Physical Medicine and Rehabilitation Consult Reason for Consult: Right side weakness Referring Physician: [facility]     HPI: [name] is a 78 y.o. right-handed female with history of hypertension, hyperlipidemia, diabetes mellitus, end-stage renal disease with hemodialysis Monday Wednesday Friday, CAD, aortic stenosis, history of right thalamic CVA 2018, tobacco abuse.  Per chart review patient lives with spouse.  1 level home.  Patient did use a Rollator when going to dialysis.  Presented 07/23/2019 with right side weakness.  Cranial CT scan showed no intracranial hemorrhage.  Chronic small vessel ischemia and generalized volume loss noted.  Patient did not receive TPA.  CT angiogram negative for large vessel occlusion however positive for significant intracranial and extracranial atherosclerotic disease.  MRI of the brain areas of acute infarction throughout the distal left anterior cerebral artery territory.  Echocardiogram is pending.  Currently maintained on aspirin and Plavix for CVA prophylaxis.  Hemodialysis ongoing as per renal services.  Therapy evaluation completed with recommendations of physical medicine rehab consult.   The patient was seen with Dr. Leonie Man feels should be ready for rehab in am     Review of Systems  Constitutional: Negative for chills and fever.  HENT: Negative for hearing loss.   Eyes: Negative for blurred vision and double vision.  Respiratory: Positive for shortness of breath. Negative for cough.   Cardiovascular: Positive for leg swelling. Negative for chest pain and palpitations.  Gastrointestinal: Positive for constipation. Negative for heartburn, nausea and vomiting.       GERD  Musculoskeletal: Positive for back pain.       She did have a fall in the house approximately 6  months ago.  Skin: Negative for rash.  Neurological: Positive for weakness.  Psychiatric/Behavioral: The patient has insomnia.        Anxiety  All other systems reviewed and are negative.       Past Medical History:  Diagnosis Date  . Anemia      Pt is taking iron.   [name] Anxiety    . Arthritis    . Carotid stenosis      40-59% bilateral ICA stenosis in 2/12.  . Chronic low back pain    . CKD (chronic kidney disease)      Dr. Audie Clear at [facility]  . Coronary artery disease      Pt presented 2/10 to [facility] with NSTEMI and diastolic CHF exacerbation.  LHC was done  3/10 showing 99% pRCA stenosis and 80% calcified pLAD stenosis with L=>R collaterals.  Pt was referred  for CABG which was done by Dr. Prescott Gum with LIMA-LAD, SVG-RCA, SVG-OM.  . Diabetes mellitus    . Diabetic neuropathy ([place])    . Diastolic CHF (HCC)      Echo (2/10) showed EF 55-65%, mild LVH, diastolic dysfunction, mild AS with mean gradient 12 mmHg, PASP 43 mmHg.  Echo (2/12): EF 55-60%, mild LVH, mild AS (mean gradient 12), PA systolic pressure 32 mmHg.     [name] GERD (gastroesophageal reflux disease)    . Heart murmur    . Hyperlipidemia    . Hypertension    . Mild aortic stenosis      mean gradient 12 mmHg in 2/12.  . Myocardial infarction ([place])      "mild"  .  Pneumonia    . PONV (postoperative nausea and vomiting)    . Stroke [facility])      " mild"  . Thrombocytopenia ([place])    . Unsteady gait           Past Surgical History:  Procedure Laterality Date  . AORTIC ARCH ANGIOGRAPHY N/A 10/18/2018    Procedure: AORTIC ARCH ANGIOGRAPHY;  Surgeon: Marty Heck, MD;  Location: [place] [facility];  Service: Cardiovascular;  Laterality: N/A;  . AV FISTULA PLACEMENT Left 01/30/2015    Procedure: Creation of a Radial Cephalic AV Fistula left wrist;  Surgeon: [name], MD;  Location: [place];  Service: Vascular;  Laterality: Left;  . AV FISTULA PLACEMENT Left 09/06/2018    Procedure: LEFT ARM  ARTERIOVENOUS (AV) FISTULA CREATION;  Surgeon: Marty Heck, MD;  Location: [place];  Service: Vascular;  Laterality: Left;  . BACK SURGERY        multiple  . BREAST SURGERY        biopsy  . CARDIAC CATHETERIZATION      . CATARACT EXTRACTION W/ INTRAOCULAR LENS  IMPLANT, BILATERAL      . COLONOSCOPY Left 11/03/2016    Procedure: COLONOSCOPY;  Surgeon: [name], MD;  Location: [facility] ENDOSCOPY;  Service: Endoscopy;  Laterality: Left;  . COLONOSCOPY W/ BIOPSIES AND POLYPECTOMY      . CORONARY ARTERY BYPASS GRAFT   09/2008    pt with NSTEMI and diastolic CHF exacerbation.  LHC was done  3/10 showing 99% pRCA stenosis and 80% calcified pLAD stenosis with L=>R collaterals.  Pt was referred  for CABG which was done by Dr. Prescott Gum with LIMA-LAD, SVG-RCA, SVG-OM.  [name] ESOPHAGOGASTRODUODENOSCOPY N/A 11/02/2016    Procedure: ESOPHAGOGASTRODUODENOSCOPY (EGD);  Surgeon: [name], MD;  Location: [facility] ENDOSCOPY;  Service: Endoscopy;  Laterality: N/A;  . GIVENS CAPSULE STUDY N/A 11/29/2016    Procedure: GIVENS CAPSULE STUDY;  Surgeon: [name], MD;  Location: [place];  Service: Endoscopy;  Laterality: N/A;  . LIGATION OF ARTERIOVENOUS  FISTULA Left 09/13/2018    Procedure: LIGATION OF LEFT ARTERIOVENOUS  FISTULA;  Surgeon: Waynetta Sandy, MD;  Location: [place];  Service: Vascular;  Laterality: Left;  . REVISON OF ARTERIOVENOUS FISTULA Left 07/02/2015    Procedure: REVISON OF LEFT RADIOCEPHALIC ARTERIOVENOUS FISTULA;  Surgeon: [name], MD;  Location: [place];  Service: Vascular;  Laterality: Left;  . RIGHT/LEFT HEART CATH AND CORONARY/GRAFT ANGIOGRAPHY N/A 05/18/2018    Procedure: RIGHT/LEFT HEART CATH AND CORONARY/GRAFT ANGIOGRAPHY;  Surgeon: Leonie Man, MD;  Location: [place] [facility];  Service: Cardiovascular;  Laterality: N/A;  . UPPER EXTREMITY ANGIOGRAPHY Left 10/18/2018    Procedure: UPPER EXTREMITY ANGIOGRAPHY;  Surgeon: Marty Heck, MD;  Location: [place] [facility]  [facility];  Service: Cardiovascular;  Laterality: Left;    Family History  Adopted: Yes  Family history unknown: Yes    Social History:  reports that she has quit smoking. Her smoking use included cigarettes. She has never used smokeless tobacco. She reports current alcohol use. She reports that she does not use drugs. Allergies:       Allergies  Allergen Reactions  . Doxycycline Nausea And Vomiting      Caused "DEATHLY NAUSEA AND VOMITING"  . Lipitor [Atorvastatin] Other (See Comments)      Stomach pain  . Strawberry Extract Rash          Medications Prior to Admission  Medication Sig Dispense Refill  . acetaminophen (  TYLENOL) 500 MG tablet Take 1,000 mg by mouth 3 (three) times daily as needed for moderate pain.      [name] ALPRAZolam (XANAX) 1 MG tablet Take 1 mg by mouth at bedtime.       . AMITIZA 24 MCG capsule Take 24 mcg by mouth 2 (two) times daily as needed for constipation.       [name] b complex-vitamin c-folic acid (NEPHRO-VITE) 0.8 MG TABS tablet Take 1 tablet by mouth daily.   3  . calcitRIOL (ROCALTROL) 0.5 MCG capsule Take 0.5 mcg by mouth daily.   1  . diphenhydrAMINE (BENADRYL) 25 MG tablet Take 25 mg by mouth daily as needed for allergies.      . folic acid (FOLVITE) 1 MG tablet Take 1 tablet (1 mg total) by mouth daily. 30 tablet 0  . gabapentin (NEURONTIN) 100 MG capsule Take 100 mg by mouth at bedtime.      . midodrine (PROAMATINE) 10 MG tablet Take 10 mg by mouth 3 (three) times a week. Patient takes on dialysis days, M,W,F      . rosuvastatin (CRESTOR) 5 MG tablet TAKE 1 TABLET BY MOUTH EVERYDAY AT BEDTIME (Patient taking differently: Take 5 mg by mouth at bedtime. ) 90 tablet 3  . sevelamer carbonate (RENVELA) 800 MG tablet Take 1,600 mg 3 (three) times daily with meals by mouth.       . vitamin B-12 (CYANOCOBALAMIN) 500 MCG tablet Take 1 tablet (500 mcg total) by mouth daily. 30 tablet 0      Home: Home Living Family/patient expects to be discharged to:: Private  residence Living Arrangements: Spouse/significant other Available Help at Discharge: Family, Available 24 hours/day Type of Home: House Home Access: Level entry Home Layout: One level Bathroom Shower/Tub: Chiropodist: Standard Home Equipment: Environmental consultant - 4 wheels, Toilet riser  Functional History: Prior Function Level of Independence: Needs assistance Gait / Transfers Assistance Needed: no device in the home, rollator to dialysis ADL's / Homemaking Assistance Needed: sponge bathes with some help from spouse Functional Status:  Mobility: Bed Mobility Overal bed mobility: Needs Assistance Bed Mobility: Rolling, Sidelying to Sit, Sit to Supine Rolling: Mod assist Sidelying to sit: Max assist, HOB elevated Sit to supine: Total assist General bed mobility comments: cues and increased time to initiate rolling to R, side to sit assist for trunk and legs, to supine assist for all aspects Transfers Overall transfer level: Needs assistance Equipment used: None Transfers: Stand Pivot Transfers Stand pivot transfers: Max assist General transfer comment: to regular chair from stretcher in ED max A for pivot to R, then to higher stretcher max lifting assist from chair and sat on very edge of bed, assist to scoot and lower to supine   ADL:   Cognition: Cognition Overall Cognitive Status: Impaired/Different from baseline Orientation Level: Oriented X4 Cognition Arousal/Alertness: Awake/alert Behavior During Therapy: WFL for tasks assessed/performed Overall Cognitive Status: Impaired/Different from baseline Area of Impairment: Memory, Following commands, Problem solving Memory: Decreased short-term memory Following Commands: Follows one step commands consistently, Follows one step commands with increased time Problem Solving: Slow processing, Decreased initiation, Requires verbal cues, Requires tactile cues General Comments: reports feels slower with thinking and recalling  information than prior to event   Blood pressure 119/67, pulse 83, temperature 98 F (36.7 C), temperature source Oral, resp. rate 20, weight 51.9 kg, SpO2 95 %. Physical Exam  Neurological:  Patient is alert in no acute distress and oriented x3.  Follows simple commands.  Fair insight and awareness of deficits    General: No acute distress Mood and affect are appropriate Heart: Regular rate and rhythm no rubs murmurs or extra sounds Lungs: Clear to auscultation, breathing unlabored, no rales or wheezes Abdomen: Positive bowel sounds, soft nontender to palpation, nondistended Extremities: No clubbing, cyanosis, or edema Skin: No evidence of breakdown, no evidence of rash Neurologic: Cranial nerves II through XII intact, motor strength is 4/5 in left  deltoid, bicep, tricep, grip, hip flexor, knee extensors, ankle dorsiflexor and plantar flexor 0/5 on RIght side  Sensory exam normal sensation pinch in  Right upper and lower extremities Cerebellar exam normal finger to nose to finger as well as heel to shin cannot perform due to weakness Musculoskeletal: Full range of motion in all 4 extremities. No joint swelling Lab Results Last 24 Hours       Results for orders placed or performed during the hospital encounter of 07/23/19 (from the past 24 hour(s))  CBC     Status: Abnormal    Collection Time: 07/24/19  3:59 AM  Result Value Ref Range    WBC 10.4 4.0 - 10.5 K/uL    RBC 3.43 (L) 3.87 - 5.11 MIL/uL    Hemoglobin 11.8 (L) 12.0 - 15.0 g/dL    HCT 38.4 36.0 - 46.0 %    MCV 112.0 (H) 80.0 - 100.0 fL    MCH 34.4 (H) 26.0 - 34.0 pg    MCHC 30.7 30.0 - 36.0 g/dL    RDW 17.6 (H) 11.5 - 15.5 %    Platelets 128 (L) 150 - 400 K/uL    nRBC 0.2 0.0 - 0.2 %       Imaging Results (Last 48 hours)  Ct Angio Head W Or [name] Contrast   Result Date: 07/23/2019 CLINICAL DATA:  Right-sided paralysis EXAM: CT ANGIOGRAPHY HEAD AND NECK TECHNIQUE: Multidetector CT imaging of the head and neck was  performed using the standard protocol during bolus administration of intravenous contrast. Multiplanar CT image reconstructions and MIPs were obtained to evaluate the vascular anatomy. Carotid stenosis measurements (when applicable) are obtained utilizing NASCET criteria, using the distal internal carotid diameter as the denominator. CONTRAST:  26mL OMNIPAQUE IOHEXOL 350 MG/ML SOLN COMPARISON:  Head CT same day FINDINGS: CTA NECK FINDINGS SKELETON: There is no bony spinal canal stenosis. No lytic or blastic lesion. OTHER NECK: Normal pharynx, larynx and major salivary glands. No cervical lymphadenopathy. Unremarkable thyroid gland. UPPER CHEST: No pneumothorax or pleural effusion. No nodules or masses. AORTIC ARCH: There is moderate calcific atherosclerosis of the aortic arch. There is no aneurysm, dissection or hemodynamically significant stenosis of the visualized portion of the aorta. Conventional 3 vessel aortic branching pattern. 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 RIGHT CAROTID SYSTEM: There is atherosclerotic calcification within the right common carotid artery. There is bulky calcific atherosclerosis at the carotid bifurcation extending into the proximal right ICA resulting in approximately 50% stenosis. LEFT CAROTID SYSTEM: There is atherosclerotic calcification within the common carotid artery and at the carotid bifurcation. There is no flow-limiting stenosis. VERTEBRAL ARTERIES: Left dominant configuration. Both origins are clearly patent. There is multifocal calcification within both vertebral arteries which remain patent to the vertebrobasilar confluence. CTA HEAD FINDINGS POSTERIOR CIRCULATION: --Vertebral arteries: Atherosclerotic calcification within the left vertebral artery without stenosis. The right vertebral artery terminates in PICA. --Posterior inferior cerebellar arteries (PICA): Patent origins from the vertebral arteries. --Anterior inferior cerebellar arteries (AICA):  Patent origins from the basilar artery. 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 --Superior cerebellar arteries: Normal. --Posterior cerebral arteries: Normal. Both originate from the basilar artery. Posterior communicating arteries (p-comm) are diminutive or absent. ANTERIOR CIRCULATION: --Intracranial internal carotid arteries: Atherosclerotic calcification of the internal carotid arteries at the skull base without hemodynamically significant stenosis. 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 Patent distal branches. Right MCA is normal. VENOUS SINUSES: As permitted by contrast timing, patent. ANATOMIC VARIANTS: None Review of the MIP images confirms the above findings. IMPRESSION: 1. No emergent large vessel occlusion. 2. 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 3. 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 4. Bilateral carotid calcific atherosclerosis with 50% stenosis of the proximal right ICA. 5. Aortic Atherosclerosis (ICD10-I70.0). Electronically Signed   By: Ulyses Jarred M.D.   On: 07/23/2019 01:26    Dg Chest 2 View   Result Date: 07/23/2019 CLINICAL DATA:  Facial droop and right-sided paralysis. EXAM: CHEST - 2 VIEW COMPARISON:  Radiograph 05/29/2018 FINDINGS: Right dialysis catheter tip at the atrial caval junction. Post median sternotomy and CABG. Decreased cardiomegaly from prior exam. Aortic atherosclerosis and tortuosity. Bilateral pleural effusions have resolved. Improved interstitial thickening. Mild underlying interstitial coarsening which may be chronic. No focal airspace disease. No pleural fluid or pneumothorax. No acute osseous abnormalities are seen. IMPRESSION: 1. Mild interstitial coarsening, favor this is chronic rather than pulmonary edema. 2. No other acute findings. 3. Dialysis catheter  tip at the atrial caval junction. Electronically Signed   By: Keith Rake M.D.   On: 07/23/2019 02:45    Ct Angio Neck W Or [name] Contrast   Result Date: 07/23/2019 CLINICAL DATA:  Right-sided paralysis EXAM: CT ANGIOGRAPHY HEAD AND NECK TECHNIQUE: Multidetector CT imaging of the head and neck was performed using the standard protocol during bolus administration of intravenous contrast. Multiplanar CT image reconstructions and MIPs were obtained to evaluate the vascular anatomy. Carotid stenosis measurements (when applicable) are obtained utilizing NASCET criteria, using the distal internal carotid diameter as the denominator. CONTRAST:  42mL OMNIPAQUE IOHEXOL 350 MG/ML SOLN COMPARISON:  Head CT same day FINDINGS: CTA NECK FINDINGS SKELETON: There is no bony spinal canal stenosis. No lytic or blastic lesion. OTHER NECK: Normal pharynx, larynx and major salivary glands. No cervical lymphadenopathy. Unremarkable thyroid gland. UPPER CHEST: No pneumothorax or pleural effusion. No nodules or masses. AORTIC ARCH: There is moderate calcific atherosclerosis of the aortic arch. There is no aneurysm, dissection or hemodynamically significant stenosis of the visualized portion of the aorta. Conventional 3 vessel aortic branching pattern. 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 RIGHT CAROTID SYSTEM: There is atherosclerotic calcification within the right common carotid artery. There is bulky calcific atherosclerosis at the carotid bifurcation extending into the proximal right ICA resulting in approximately 50% stenosis. LEFT CAROTID SYSTEM: There is atherosclerotic calcification within the common carotid artery and at the carotid bifurcation. There is no flow-limiting stenosis. VERTEBRAL ARTERIES: Left dominant configuration. Both origins are clearly patent. There is multifocal calcification within both vertebral arteries which remain patent to the vertebrobasilar confluence. CTA HEAD FINDINGS POSTERIOR  CIRCULATION: --Vertebral arteries: Atherosclerotic calcification within the left vertebral artery without stenosis. The right vertebral artery terminates in PICA. --Posterior inferior cerebellar arteries (PICA): Patent origins from the vertebral arteries. --Anterior inferior cerebellar arteries (AICA): Patent origins from the basilar artery. 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 --Superior cerebellar arteries:  Normal. --Posterior cerebral arteries: Normal. Both originate from the basilar artery. Posterior communicating arteries (p-comm) are diminutive or absent. ANTERIOR CIRCULATION: --Intracranial internal carotid arteries: Atherosclerotic calcification of the internal carotid arteries at the skull base without hemodynamically significant stenosis. 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 Patent distal branches. Right MCA is normal. VENOUS SINUSES: As permitted by contrast timing, patent. ANATOMIC VARIANTS: None Review of the MIP images confirms the above findings. IMPRESSION: 1. No emergent large vessel occlusion. 2. 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 3. 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 4. Bilateral carotid calcific atherosclerosis with 50% stenosis of the proximal right ICA. 5. Aortic Atherosclerosis (ICD10-I70.0). Electronically Signed   By: Ulyses Jarred M.D.   On: 07/23/2019 01:26    [name] Contrast   Result Date: 07/23/2019 CLINICAL DATA:  Hypertension. Stroke. Right arm and leg numbness in weakness. EXAM: MRI HEAD WITHOUT CONTRAST TECHNIQUE: Multiplanar, multiecho pulse sequences of the brain and surrounding structures were obtained without intravenous contrast. COMPARISON:  CT studies earlier same day FINDINGS: Brain: Diffusion imaging shows acute infarction in the distal left anterior  cerebral artery territory. No evidence of mass effect or hemorrhage. No other vascular territory insult. Elsewhere, there chronic small-vessel ischemic changes throughout the pons. No focal cerebellar insult. Cerebral hemispheres show chronic small-vessel ischemic changes of the thalami, basal ganglia and hemispheric deep white matter. No hydrocephalus or extra-axial collection. Vascular: Major vessels at the base of the brain show flow. Skull and upper cervical spine: Negative Sinuses/Orbits: Clear/normal Other: None IMPRESSION: Areas of acute infarction throughout the distal left anterior cerebral artery territory. Mild swelling but no mass effect or hemorrhage. Chronic small-vessel ischemic changes elsewhere throughout the brain as outlined above. Electronically Signed   By: Nelson Chimes M.D.   On: 07/23/2019 09:45    Ct Head Code Stroke [name] Contrast   Result Date: 07/23/2019 CLINICAL DATA:  Code stroke. Right-sided facial paralysis. Last seen normal 6 p.m. EXAM: CT HEAD WITHOUT CONTRAST TECHNIQUE: Contiguous axial images were obtained from the base of the skull through the vertex without intravenous contrast. COMPARISON:  Brain MRI 09/26/2016 FINDINGS: Brain: There is no mass, hemorrhage or extra-axial collection. There is generalized atrophy without lobar predilection. There is hypoattenuation of the periventricular white matter, most commonly indicating chronic ischemic microangiopathy. 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 Vascular: No abnormal hyperdensity of the major intracranial arteries or dural venous sinuses. No intracranial atherosclerosis. Skull: The visualized skull base, calvarium and extracranial soft tissues are normal. Sinuses/Orbits: No fluid levels or advanced mucosal thickening of the visualized paranasal sinuses. No mastoid or middle ear effusion. The orbits are normal. ASPECTS [facility] Stroke Program Early CT Score) - Ganglionic level infarction  (caudate, lentiform nuclei, internal capsule, insula, M1-M3 cortex): 7 - Supraganglionic infarction (M4-M6 cortex): 3 Total score (0-10 with 10 being normal): 10 IMPRESSION: 1. No intracranial hemorrhage. 2. 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 3. ASPECTS is 10. 4. These results were communicated to Dr. Karena Addison Aroor at 12:57 am on 07/23/2019 by text page via the [facility] messaging system. Electronically Signed   By: Ulyses Jarred M.D.   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 Does the need for close, 24 hr/day medical supervision in concert with the  patient's rehab needs make it unreasonable for this patient to be served in a less intensive setting? Yes 2. Co-Morbidities requiring supervision/potential complications: ESRD on HD 3. Due to bladder management, bowel management, safety, skin/wound care, disease management, medication administration, pain management and patient education, does the patient require 24 hr/day rehab nursing? Yes 4. Does the patient require coordinated care of a physician, rehab nurse, therapy disciplines of PT, OT< SLP to address physical and functional deficits in the context of the above medical diagnosis(es)? Yes Addressing deficits in the following areas: balance, endurance, locomotion, strength, transferring, bowel/bladder control, bathing, dressing, feeding, grooming, toileting, cognition and psychosocial support 5. Can the patient actively participate in an intensive therapy program of at least 3 hrs of therapy per day at least 5 days per week? Yes 6. The potential for patient to make measurable gains while on inpatient rehab is good 7. Anticipated functional outcomes upon discharge from inpatient rehab are min assist  with PT, min assist with OT, min assist with SLP. 8. Estimated rehab length of stay to reach the above functional goals is: 18-21d 9. Anticipated discharge  destination: Home 10. Overall Rehab/Functional Prognosis: good   RECOMMENDATIONS: This patient's condition is appropriate for continued rehabilitative care in the following setting: CIR Patient has agreed to participate in recommended program. Yes Note that insurance prior authorization may be required for reimbursement for recommended care.   Comment: should be ready in am "I have personally performed a face to face diagnostic evaluation of this patient.  Additionally, I have reviewed and concur with the physician assistant's documentation above." Cathlyn Parsons, PA-C 07/24/2019    Charlett Blake M.D. [facility] FAAPM&R (Sports Med, Neuromuscular Med) Diplomate Am Board of Electrodiagnostic Med

## 2019-07-29 NOTE — IPOC Note (Signed)
Overall Plan of Care [facility]) Patient Details Name: [name] MRN: [id number] DOB: 12-20-1940  Admitting Diagnosis: <principal problem not specified>  Hospital Problems: Active Problems:   Small vessel cerebrovascular accident (CVA) [facility])     Functional Problem List: Nursing Behavior, Bladder, Bowel, Edema, Endurance, Medication Management, Nutrition, Pain, Perception, Safety, Skin Integrity  PT Balance, Endurance, Motor, Nutrition, Pain, Perception, Safety, Skin Integrity  OT Balance, Cognition, Endurance, Motor, Pain, Perception, Safety, Skin Integrity, Vision  SLP Cognition  TR         Basic ADL's: OT Eating, Grooming, Bathing, Dressing, Toileting     Advanced  ADL's: OT       Transfers: PT Bed to Chair, Bed Mobility, Car, Chief Operating Officer: PT Ambulation, Emergency planning/management officer, Stairs     Additional Impairments: OT Fuctional Use of Upper Extremity  SLP Social Cognition   Memory, Problem Solving  TR      Anticipated Outcomes Item Anticipated Outcome  Self Feeding Supervision/Setup  Swallowing      Basic self-care  Min assist  Toileting  Min assist   Bathroom Transfers Min assist  Bowel/Bladder  continnt of bowel ande bladder with mod assist  Transfers  min assist w/c level; mod assist car  Locomotion  supervision w/c mobility; mod assist gait  Communication     Cognition  supervision  Pain  Pain ledss than or equal to 4/10 with mod asist  Safety/Judgment  fre from falls/injury aned displaying appropriate safety judgement with min assiwst   Therapy Plan: PT Intensity: Minimum of 1-2 x/day ,45 to 90 minutes PT Frequency: 5 out of 7 days PT Duration Estimated Length of Stay: 21-28 days OT Intensity: Minimum of 1-2 x/day, 45 to 90 minutes OT Frequency: 5 out of 7 days OT Duration/Estimated Length of Stay: 21-24 days SLP Intensity: Minumum of 1-2 x/day, 30 to 90 minutes SLP Frequency: 3 to 5 out of 7 days SLP  Duration/Estimated Length of Stay: 21-24 days   Due to the current state of emergency, patients may not be receiving their 3-hours of Medicare-mandated therapy.   Team Interventions: Nursing Interventions Patient/Family Education, Bladder Management, Bowel Management, Disease Management/Prevention, Pain Management, Medication Management, Skin Care/Wound Management, Cognitive Remediation/Compensation, Discharge Planning  PT interventions Ambulation/gait training, Balance/vestibular training, Cognitive remediation/compensation, Community reintegration, Discharge planning, Disease management/prevention, DME/adaptive equipment instruction, Functional mobility training, Functional electrical stimulation, Neuromuscular re-education, Pain management, Patient/family education, Psychosocial support, Skin care/wound management, Splinting/orthotics, Stair training, Therapeutic Activities, Therapeutic Exercise, UE/LE Strength taining/ROM, UE/LE Coordination activities, Visual/perceptual remediation/compensation, Wheelchair propulsion/positioning  OT Interventions Training and development officer, Cognitive remediation/compensation, Community reintegration, Discharge planning, Disease mangement/prevention, DME/adaptive equipment instruction, Functional electrical stimulation, Functional mobility training, Neuromuscular re-education, Pain management, Patient/family education, Psychosocial support, Self Care/advanced ADL retraining, Skin care/wound managment, Splinting/orthotics, Therapeutic Activities, Therapeutic Exercise, UE/LE Strength taining/ROM, UE/LE Coordination activities, Visual/perceptual remediation/compensation, Wheelchair propulsion/positioning  SLP Interventions Cognitive remediation/compensation, English as a second language teacher, Functional tasks, Environmental controls, Internal/external aids, Patient/family education  TR Interventions    SW/CM Interventions Discharge Planning, Psychosocial Support, Patient/Family Education    Barriers to Discharge MD  Medical stability and fatigue  Nursing      PT Decreased caregiver support, Hemodialysis husband is limited physically; son available PRN but is also primary caregiver for his wife with stage IV cancer  OT Home environment access/layout, Incontinence 2 steps down to den and 2 steps in to home  SLP      SW       Team Discharge Planning: Destination: PT-Home ,OT- Home ,  SLP-Home Projected Follow-up: PT-Home health PT, 24 hour supervision/assistance, OT-  Home health OT, 24 hour supervision/assistance, SLP-Home Health SLP, 24 hour supervision/assistance Projected Equipment Needs: PT-Wheelchair (measurements), Wheelchair cushion (measurements), To be determined, OT- 3 in 1 bedside comode, SLP-None recommended by SLP Equipment Details: PT- , OT-  Patient/family involved in discharge planning: PT- Patient, Family member/caregiver,  OT-Patient, SLP-Patient  MD ELOS: 21-25d Medical Rehab Prognosis:  Fair Assessment:  78 year old right-handed female with history of hypertension, hyperlipidemia, diabetes mellitus, history of GI bleed,IBS,congestive diastolic heart failure, end-stage renal disease with hemodialysis Monday Wednesday and Fridays, CAD/CABG 2010, aortic stenosis, history of right thalamic CVA 2018, tobacco abuse. Per chart review patient lives with spouse. 1 level home. Patient did use a Rollator when going to dialysis. Presented 07/23/2019 with right-sided weakness. Cranial CT scan showed no intracranial hemorrhage. Chronic small vessel ischemia and generalized volume loss noted. Patient did not receive TPA. CT angiogram negative for large vessel occlusion however positive for significant intracranial and extracranial atherosclerotic disease. MRI of the brain areas of acute infarction throughout the distal left anterior cerebral artery territory. Echocardiogram with ejection fraction of 30% without embolihowever it did show catheter at right atrium  with oscillating density considering vegetation versus thrombus discussed with neurology services awaiting plan for TEE... Admission chemistries with creatinine 4.26, SARS coronavirus negative, hemoglobin 12.6, platelet count 125,000. Neurology follow-up maintained on aspirin and Plavix for CVA prophylaxis x3 weeks then aspirin alone. Hemodialysis ongoing as per renal services   Now requiring 24/7 Rehab RN,MD, as well as CIR level PT, OT and SLP.  Treatment team will focus on ADLs and mobility with goals set at [place] (some [place] may be needed as well for LE ADL) See Team Conference Notes for weekly updates to the plan of care

## 2019-07-29 NOTE — Progress Notes (Signed)
Inpatient Rehabilitation  Patient information reviewed and entered into eRehab system by Hannahgrace Lalli M. Conall Vangorder, M.A., CCC/SLP, PPS Coordinator.  Information including medical coding, functional ability and quality indicators will be reviewed and updated through discharge.    

## 2019-07-29 NOTE — Care Management Note (Signed)
Inpatient [facility] Individual Statement of Services  Patient Name:  [name]  Date:  07/29/2019  Welcome to the [facility].  Our goal is to provide you with an individualized program based on your diagnosis and situation, designed to meet your specific needs.  With this comprehensive rehabilitation program, you will be expected to participate in at least 3 hours of rehabilitation therapies Monday-Friday, with modified therapy programming on the weekends.  Your rehabilitation program will include the following services:  Physical Therapy (PT), Occupational Therapy (OT), Speech Therapy (ST), 24 hour per day rehabilitation nursing, Neuropsychology, Case Management (Social Worker), Rehabilitation Medicine, Nutrition Services and Pharmacy Services  Weekly team conferences will be held on Wednesday to discuss your progress.  Your Social Worker will talk with you frequently to get your input and to update you on team discussions.  Team conferences with you and your family in attendance may also be held.  Expected length of stay: 21-28 days  Overall anticipated outcome: min-mod level  Depending on your progress and recovery, your program may change. Your Social Worker will coordinate services and will keep you informed of any changes. Your Social Worker's name and contact numbers are listed  below.  The following services may also be recommended but are not provided by the [facility]:    [place] will be made to provide these services after discharge if needed.  Arrangements include referral to agencies that provide these services.  Your insurance has been verified to be:  [place] primary doctor is:  Kathryne Eriksson  Pertinent information will be shared with your doctor and your insurance company.  Social Worker:  Ovidio Kin, [place] or  ([phone]  Information discussed with and copy given to patient by: Elease Hashimoto, 07/29/2019, 10:24 AM

## 2019-07-29 NOTE — Progress Notes (Signed)
Speech Language Pathology Daily Session Note  Patient Details  Name: [name] MRN: [id number] Date of Birth: [birth year]  Today's Date: 07/29/2019 SLP Individual Time: 1100-1128 and 902-930 SLP Individual Time Calculation (min): 28 min and 28 min  Short Term Goals: Week 1: SLP Short Term Goal 1 (Week 1): Pt will use external aids to recall daily information with min assist verbal cues. SLP Short Term Goal 2 (Week 1): Pt will complete mildly complex tasks with min assist verbal cues for functional problem solving.  Skilled Therapeutic Interventions:  1# Skilled ST services focused on cognitive skills. SLP facilitated basic problem solving skills in counting basic change, pt required max A verbal cues, suggest due to delayed processing and deficits in working memory. Pt counted change only left of midline requring mod A fade to min A verbal cues to locate cions on right. When given medication in pill form pt required mod A verbal cues to problem solving attempting to use right hand with no movement, however pt demonstrated awareness of delayed processing. Pt was left in room with call bell within reach and chair alarm set. ST recommends to continue skilled ST services.   2# Skilled ST services focused on cognitive skills. Pt's husband present for treatment session. Pt appeared even more fatigue than in previous session. Pt demonstrated recall of amount of change with min A verbal cues from earlier SLP session. SLP facilitated basic problem solving and sustained attention while displaying requested amount of change even with written aids, pt required max A verbal cues. Pt demonstrated ability to sort coins by amounts with written aid with mod verbal cues for errors. Pt was left in room with call bell within reach and bed alarm set. ST recommends to continue skilled ST services.      Pain Pain Assessment Pain Scale: Faces Pain Score: 0-No pain  Therapy/Group: Individual Therapy  Ailey Wessling   [facility] 07/29/2019, 3:37 PM

## 2019-07-29 NOTE — Progress Notes (Signed)
Occupational Therapy Session Note  Patient Details  Name: [name] MRN: [id number] Date of Birth: [birth year]  Today's Date: 07/29/2019 OT Individual Time: [id number] OT Individual Time Calculation (min): 74 min    Short Term Goals: Week 1:  OT Short Term Goal 1 (Week 1): Pt will maintain sitting balance with min assist to prepare for ADL tasks OT Short Term Goal 2 (Week 1): Pt will complete UB bathing with min assist OT Short Term Goal 3 (Week 1): Pt will complete LB bathing with max assist of 1 caregiver OT Short Term Goal 4 (Week 1): Pt will complete toilet transfer with max assist of one caregiver  Skilled Therapeutic Interventions/Progress Updates:    Pt worked on selfcare tasks sitting at the sink and supine in the bed.  She was sitting in the tilt in space wheelchair to start the session.  She was positioned over the sink to work on UB bathing and dressing.  Pt with increased cervical flexion as well as protraction at rest with inability to maintain cervical extension to neutral.  She was able to wash her face with trunk support in the chair, but needed assistance for wringing out the washcloth.  She needed max hand over hand assistance for washing the left arm with the right hand.  Max instructional cueing needed for thoroughness with bathing of her UB.  Next, she completed donning pullover shirt with max assist and max demonstrational cueing for following hemi techniques.  Therapist then transferred her to the bed with total assist squat pivot for working on washing of her peri area and buttocks as well as donning new brief.  She needed max assist for all rolling in the bed to the right with total assist to roll the left side.  Decreased ability to initiate any abdominal activity for rolling without max facilitation.  She needed total assist for washing her buttocks but she was able to wash her front peri area with setup.  Total assist was also needed for donning a new brief and pulling  her pants over her hips.  She finished session with RUE and RLE positioned in bed and her spouse at bedside.  Discussed expectations for pt to need mod assist or greater at discharge.  Question if he will be able to provide this based on his own health.    Therapy Documentation Precautions:  Precautions Precautions: Fall Precaution Comments: mild Rt inattention; R hemiplegia Restrictions Weight Bearing Restrictions: No   Pain: Pain Assessment Pain Scale: Faces Pain Score: 0-No pain ADL: See Care Tool Section for some details of selfcare and transfers  Therapy/Group: Individual Therapy  Pearse Shiffler OTR/L 07/29/2019, 11:29 AM

## 2019-07-29 NOTE — Progress Notes (Signed)
Orthopedic Tech Progress Note Patient Details:  [name] 27-May-1941 [id number] Patient was serviced this morning for a PRAFO and a RESTING WHO Patient ID: [name], female   DOB: 06-13-1941, 78 y.o.   MRN: [id number]   Janit Pagan 07/29/2019, 8:10 AM

## 2019-07-29 NOTE — Progress Notes (Signed)
[facility] PHYSICAL MEDICINE & REHABILITATION PROGRESS NOTE   Subjective/Complaints:    ROS: Patient denies f nausea, vomiting, diarrhea, cough, shortness of breath or chest pain, joint or back pain,   Objective:   No results found. Recent Labs    07/26/19 1427 07/29/19 0726  WBC 10.8* 11.2*  HGB 11.7* 11.7*  HCT 36.5 37.4  PLT 120* PLATELET CLUMPS NOTED ON SMEAR, COUNT APPEARS DECREASED   Recent Labs    07/26/19 1427 07/29/19 0726  NA 133* 130*  K 4.7 5.1  CL 94* 95*  CO2 25 18*  GLUCOSE 192* 152*  BUN 59* 78*  CREATININE 6.68* 8.14*  CALCIUM 9.4 9.1    Intake/Output Summary (Last 24 hours) at 07/29/2019 0953 Last data filed at 07/29/2019 0730 Gross per 24 hour  Intake 400 ml  Output --  Net 400 ml     Physical Exam: Vital Signs Blood pressure 140/71, pulse 93, temperature 98.4 F (36.9 C), temperature source Oral, resp. rate 20, height 5\' 8"  (1.727 m), weight 51 kg, SpO2 96 %.  Constitutional: No distress . Vital signs reviewed.frail appearing HEENT: EOMI, oral membranes moist Neck: supple Cardiovascular: RRR without murmur. No JVD    Respiratory: CTA Bilaterally without wheezes or rales. Normal effort    GI: BS +, non-tender, non-distended   Musculoskeletal:  General: No edema.Normal range of motion.  Cervical back: Normal range of motion.  Neurological: She isalertand oriented to person, place, and time.quiet voice. Fair insight and awareness. Speech clear. Right central 7. Mild right inattention. No field cut. RUE 0/5 RLE 0/5. LUE and LLE 4- to 4/5. No motor changes  Senses pain and light touch in all 4's. DTR's 1+ Skin: She isnot diaphoretic. A few scattered abrasions RUE>LE.foam dressing RUE   Assessment/Plan: 1. 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  Physiatrist is providing close team supervision and 24 hour  management of active medical problems listed below.  Physiatrist and rehab team continue to assess barriers to discharge/monitor patient progress toward functional and medical goals  Care Tool:  Bathing    Body parts bathed by patient: Chest, Right upper leg, Left upper leg, Face   Body parts bathed by helper: Left arm, Abdomen, Front perineal area, Buttocks, Right lower leg, Left lower leg, Right arm     Bathing assist Assist Level: 2 Helpers     Upper Body Dressing/Undressing Upper body dressing   What is the patient wearing?: Pull over shirt    Upper body assist Assist Level: Maximal Assistance - Patient 25 - 49%    Lower Body Dressing/Undressing Lower body dressing      What is the patient wearing?: Pants, Incontinence brief     Lower body assist Assist for lower body dressing: Maximal Assistance - Patient 25 - 49%     Toileting Toileting    Toileting assist Assist for toileting: Total Assistance - Patient < 25%     Transfers Chair/bed transfer  Transfers assist     Chair/bed transfer assist level: 2 Helpers     Locomotion Ambulation   Ambulation assist   Ambulation activity did not occur: Safety/medical concerns(poor postural control and decreased standing ability)          Walk 10 feet activity   Assist  Walk 10 feet activity did not occur: Safety/medical concerns        Walk 50 feet activity   Assist Walk 50  feet with 2 turns activity did not occur: Safety/medical concerns         Walk 150 feet activity   Assist Walk 150 feet activity did not occur: Safety/medical concerns         Walk 10 feet on uneven surface  activity   Assist Walk 10 feet on uneven surfaces activity did not occur: Safety/medical concerns         Wheelchair     Assist Will patient use wheelchair at discharge?: Yes Type of Wheelchair: Manual    Wheelchair assist level: Dependent - Patient 0%(TIS due to poor postural control)       Wheelchair 50 feet with 2 turns activity    Assist        Assist Level: Dependent - Patient 0%   Wheelchair 150 feet activity     Assist      Assist Level: Dependent - Patient 0%   Blood pressure 140/71, pulse 93, temperature 98.4 F (36.9 C), temperature source Oral, resp. rate 20, height 5\' 8"  (1.727 m), weight 51 kg, SpO2 96 %.  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 To be done ?Tuesday - will check with  cardiology 2. Antithrombotics: -DVT/anticoagulation:SCDs -antiplatelet therapy: Aspirin 81 mg daily and Plavix 75 mg daily x3 weeks then aspirin alone 3. Pain Management:Tylenol as needed 4. Mood:Xanax 1 mg nightly -antipsychotic agents: N/A 5. Neuropsych: This patientiscapable of making decisions on herown behalf. 6. Skin/Wound Care:Routine skin checks -local skin care for abrasions -nutrition discussed  -benadryl prn for itching  -sarna lotion prn 7. Fluids/Electrolytes/Nutrition:Routine and outs with follow-up chemistries, Hyponatremia- per nephro 8. End-stage renal disease. Hemodialysis per renal services. HD after therapy day to allow for better therapy participation 9. Orthostasis. Continue ProAmatine 10 mg Monday Wednesday Friday 10. Hyperlipidemia. Crestor 11. Chronic diastolic congestive heart failure. Monitor for any signs of fluid overload. Daily weights   Filed Weights   07/27/19 1830 07/28/19 0452 07/29/19 0410  Weight: 53 kg 48.9 kg 51 kg    EF 25-30% with severe AS- fluid management per  nephro 12. Diet-controlled diabetes mellitus, hemoglobin A1c 5.3. Blood sugar checks discontinued 13. History of tobacco abuse. Counseling 14. CAD with CABG 2010. Continue aspirin. No chest pain reported. 15. IBS/history of GI bleed. Continue Amitiza24 mcg daily - incont BM yesterday , pt encouraged  to contact nursing if she feels the urge     LOS: 2 days A FACE TO FACE EVALUATION WAS PERFORMED  Charlett Blake 07/29/2019, 9:53 AM

## 2019-07-29 NOTE — Progress Notes (Signed)
Healing stage two wound to right buttocks. Ulcer red, pink wound bed with a crusted layer around the edges. No s/s of distress noted, continue plan of care. Audie Clear, LPN

## 2019-07-29 NOTE — Progress Notes (Signed)
Physical Therapy Session Note  Patient Details  Name: [name] MRN: [id number] Date of Birth: May 13, 1941  Today's Date: 07/29/2019 PT Individual Time: 0804-0900 PT Individual Time Calculation (min): 56 min    Short Term Goals: Week 1:  PT Short Term Goal 1 (Week 1): Pt will be able to demonstrate functional dynamic sitting balance wit mod assist PT Short Term Goal 2 (Week 1): Pt will be able to perform slideboard transfer with mod assist +2 PT Short Term Goal 3 (Week 1): Pt will be able to tolerate standing with lift equipment x 5 min for weightbearing/postural control retraining  Skilled Therapeutic Interventions/Progress Updates:    Pt supine in bed upon PT arrival, agreeable to therapy tx and reports muscle pain/tightness in cervical region 4/10. Performed gentle cervical PROM/stretching and soft tissue mobilization this session with patient up in chair x 5 minutes for pain relief. Pt reports incontinence of bowel, pt performed rolling max assist +2 in each direction while second helper performed pericare total assist, rolling to don clean brief and to don pants. During rolling pt with noted horizontal nystagmus and report of dizziness, may benefit from vestibular evaluation. Pt transferred to sitting EOB this session with max assist, cues for techniques. In sitting pt requires mod assist for sitting balance, total assist for slideboard transfer to the TIS, second helper available to help place the slideboard. Pt transported to the gym in [place] w/c dependent. Therapist adjusted head rest for better cervical positioning and for pain relief. Worked on cervical PROM/AROM as detailed above, also worked on visual scanning to the R this session. Pt transported back to her room and left in w/c with needs in reach and chair alarm set.   Therapy Documentation Precautions:  Precautions Precautions: Fall Precaution Comments: mild Rt inattention; R hemiplegia Restrictions Weight Bearing  Restrictions: No   Therapy/Group: Individual Therapy  Netta Corrigan, PT, DPT, CSRS 07/29/2019, 7:44 AM

## 2019-07-29 NOTE — Progress Notes (Signed)
Initial Nutrition Assessment  RD working remotely.  DOCUMENTATION CODES:   Underweight, suspect pt with some degree of malnutrition but unable to confirm at this time without NFPE  INTERVENTION:   - Continue rena-vit daily  - Nepro Shake po BID, each supplement provides 425 kcal and 19 grams protein  - Pro-stat 30 ml po BID, each supplement provides 100 kcal and 15 grams of protein  NUTRITION DIAGNOSIS:   Increased nutrient needs related to chronic illness (ESRD on HD, CHF) as evidenced by estimated needs.  GOAL:   Patient will meet greater than or equal to 90% of their needs  MONITOR:   PO intake, Supplement acceptance, Labs, Weight trends, Skin, I & O's  REASON FOR ASSESSMENT:   Other (low BMI)    ASSESSMENT:   78 year old female with PMH of HTN, HLD, DM, history of GI bleed, IBS, CHF, ESRD on HD, CAD s/p CABG 2010, aortic stenosis, history of right thalamic CVA 2018, tobacco abuse. Presented 07/23/19 with right-sided weakness. Chronic small vessel ischemia and generalized volume loss noted. CT angiogram negative for large vessel occlusion however positive for significant intracranial and extracranial atherosclerotic disease. MRI of the brain revealing areas of acute infarction throughout the distal left anterior cerebral artery territory. Pt admitted to CIR on 12/12.  Spoke with pt briefly via phone call to room. It appeared that pt was unable to understand or hear what RD was saying on the phone call, so call was ended. Will attempt to obtain diet and weight history upon follow-up.  Noted plan for HD today.  Last HD on 12/11 with 1523 ml net UF. Post-HD weight 52.9 kg.  EDW: 51.5 kg  RD will order oral nutrition supplements to aid pt in meeting kcal and protein needs during admission.  Reviewed weight history in chart. Pt with weight fluctuations between 48-52 kg over the last 11 months.  Suspect pt with some degree of malnutrition but unable to confirm at this  time without NFPE.  Meal Completion: 10-75% x last 5 meals  Medications reviewed and include: folic acid, SSI, rena-vit, Renvela, vitamin B-12  Labs reviewed: sodium 130, chloride 95 CBG's: 124-175 x 24 hours   NUTRITION - FOCUSED PHYSICAL EXAM:  Unable to complete at this time. RD working remotely.  Diet Order:   Diet Order            Diet renal/carb modified with fluid restriction Diet-HS Snack? Nothing; Fluid restriction: 1200 mL Fluid; Room service appropriate? Yes; Fluid consistency: Thin  Diet effective now              EDUCATION NEEDS:   No education needs have been identified at this time  Skin:  Skin Assessment: Skin Integrity Issues: Stage II: right buttocks  Last BM:  07/29/19 small type 6  Height:   Ht Readings from Last 1 Encounters:  07/27/19 5\' 8"  (1.727 m)    Weight:   Wt Readings from Last 1 Encounters:  07/29/19 51 kg    Ideal Body Weight:  63.6 kg  BMI:  Body mass index is 17.1 kg/m.  Estimated Nutritional Needs:   Kcal:  1450-1650  Protein:  70-85 grams  Fluid:  UOP + 1000 ml    Gaynell Face, MS, RD, LDN Inpatient Clinical Dietitian Pager: [phone] Weekend/After Hours: [phone]

## 2019-07-29 NOTE — Progress Notes (Signed)
[facility] Progress Note   Dialysis Orders: MWF at [facility] 4hr, 300/600, EDW 51.5kg, 2K/2.25Ca, TDC, UFP #4, heparin 1000 - Mircera 200 q 2 weeks Right IJ TDC - failed AVF  Assessment/Plan: 1. Acute CVA: LACAdistribution, presumed embolic.Echo with reduced EF Mod AS ? thrombus vs vet - for TEE planned for 12/16. PersistentR hemiparesis, speech intact.Statin started. Adm to CIR 12/12 2. ESRD:Continue HD per MWF sched - HD12/3 - reduce time to 3.5 hour due to high inpatient HD load. Aiming toavoid intradialytic hypotension. No heparin. Dr. Jonnie Finner spoke w/ Dr Posey Pronto , cath placed CK Vasc in September 2020. Regarding echo density attached to HD cath in R atrium, this is not unusual and would not require removal of cath.  3. Hypertension/volume:Permissive HTN initially ->BP variable net UF 1.5 Friday 12/11- post bed wt 52.9- HD today 4. Anemia:Hgb11.7 stable - no ESA needed - prev on high dose  5. Metabolic bone disease:Ca/Phos to goal. Continue home Sevelamer as binder.-un beknownst to her dialysis unit, she tells me she is taking home  calcitriol -  iPTH in goal - will d/c calcitriol and switch to TIW hectorol - home calcitriol should not be resumed at d/c 6. T2DM 7.  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 Nutrition - high risk for weight loss - intake marginal at present - nepro added  Myriam Jacobson, PA-C [place] 07/29/2019,12:30 PM  LOS: 2 days   Subjective:  Eating some.  Feeding self with left hand.  Objective Vitals:   07/28/19 0452 07/28/19 1527 07/28/19 1944 07/29/19 0410  BP: 110/62 (!) 105/50 (!) 100/58 140/71  Pulse: 93 (!) 101 100 93  Resp: 18 17 20 20   Temp: 98 F (36.7 C) 97.9 F (36.6 C) 98.7 F (37.1 C) 98.4 F (36.9 C)  TempSrc:   Oral Oral  SpO2: 100% 98% 95% 96%  Weight: 48.9 kg   51 kg  Height:        Physical Exam General: very frail, thin NAD alert and more interactive today Heart: RRR 3/6 murmur Lungs: no rales Abdomen: soft NT + BS Extremities: no LE edema right foot in brace Dialysis Access: right IJ [facility]  Neuro: right hemiplegia   Additional Objective Labs: Basic Metabolic Panel: Recent Labs  Lab 07/26/19 1158 07/26/19 1427 07/29/19 0726  NA 130* 133* 130*  K 4.7 4.7 5.1  CL 94* 94* 95*  CO2 23 25 18*  GLUCOSE 242* 192* 152*  BUN 58* 59* 78*  CREATININE 6.86* 6.68* 8.14*  CALCIUM 9.1 9.4 9.1  PHOS 2.5 2.4* 4.2   Liver Function Tests: Recent Labs  Lab 07/23/19 0038 07/23/19 0420 07/26/19 1158 07/26/19 1427 07/29/19 0726  AST 28 15  --   --   --   ALT 20 11  --   --   --   ALKPHOS 98 52  --   --   --   BILITOT 0.9 0.2*  --   --   --   PROT 7.1 4.0*  --   --   --   ALBUMIN 3.2* 1.9* 2.7* 2.8* 2.8*   No results for input(s): LIPASE, AMYLASE in the last 168 hours. CBC: Recent Labs  Lab 07/23/19 0038 07/23/19 0420 07/24/19 0359 07/26/19 1158 07/26/19 1427 07/29/19 0726  WBC 9.9 6.9 10.4 10.4 10.8* 11.2*  NEUTROABS 7.5  --   --   --   --   --  HGB 12.6 7.8* 11.8* 11.7* 11.7* 11.7*  HCT 40.8 26.0* 38.4 36.4 36.5 37.4  MCV 112.7* 115.0* 112.0* 108.3* 109.0* 109.0*  PLT 125* 90* 128* 112* 120* PLATELET CLUMPS NOTED ON SMEAR, COUNT APPEARS DECREASED   Blood Culture    Component Value Date/Time   SDES BLOOD LEFT ARM 07/26/2019 1729   SDES BLOOD RIGHT ARM 07/26/2019 1729   SPECREQUEST  07/26/2019 1729    BOTTLES DRAWN AEROBIC ONLY Blood Culture adequate volume   SPECREQUEST  07/26/2019 1729    BOTTLES DRAWN AEROBIC ONLY Blood Culture adequate volume   CULT  07/26/2019 1729    NO GROWTH 3 DAYS Performed at [facility], [place] [address] [place], [place] [address]    CULT  07/26/2019 1729    NO GROWTH 3 DAYS Performed at [facility] [address] [place], [place] [address]    REPTSTATUS PENDING 07/26/2019 1729   REPTSTATUS  PENDING 07/26/2019 1729    Cardiac Enzymes: No results for input(s): CKTOTAL, CKMB, CKMBINDEX, TROPONINI in the last 168 hours. CBG: Recent Labs  Lab 07/28/19 1212 07/28/19 1638 07/28/19 2102 07/29/19 0622 07/29/19 1148  GLUCAP 242* 171* 146* 124* 175*   Iron Studies: No results for input(s): IRON, TIBC, TRANSFERRIN, FERRITIN in the last 72 hours. Lab Results  Component Value Date   INR 1.0 07/23/2019   INR 1.07 04/18/2018   INR 1.4 10/16/2008   Studies/Results: No results found. Medications:  . ALPRAZolam  1 mg Oral QHS  . aspirin EC  81 mg Oral Daily  . Chlorhexidine Gluconate Cloth  6 each Topical BID  . clopidogrel  75 mg Oral Daily  . folic acid  1 mg Oral Daily  . insulin aspart  0-9 Units Subcutaneous TID WC  . lubiprostone  24 mcg Oral Q breakfast  . midodrine  10 mg Oral Q M,W,F-HD  . multivitamin  1 tablet Oral QHS  . rosuvastatin  20 mg Oral q1800  . sevelamer carbonate  1,600 mg Oral TID WC  . vitamin B-12  500 mcg Oral Daily

## 2019-07-30 ENCOUNTER — Inpatient Hospital Stay (HOSPITAL_COMMUNITY): Payer: Medicare HMO | Admitting: Physical Therapy

## 2019-07-30 ENCOUNTER — Inpatient Hospital Stay (HOSPITAL_COMMUNITY): Payer: Medicare HMO | Admitting: Occupational Therapy

## 2019-07-30 ENCOUNTER — Inpatient Hospital Stay (HOSPITAL_COMMUNITY): Payer: Medicare HMO | Admitting: Speech Pathology

## 2019-07-30 LAB — GLUCOSE, CAPILLARY
Glucose-Capillary: 121 mg/dL — ABNORMAL HIGH (ref 70–99)
Glucose-Capillary: 192 mg/dL — ABNORMAL HIGH (ref 70–99)
Glucose-Capillary: 160 mg/dL — ABNORMAL HIGH (ref 70–99)
Glucose-Capillary: 248 mg/dL — ABNORMAL HIGH (ref 70–99)

## 2019-07-30 MED ORDER — CHLORHEXIDINE GLUCONATE CLOTH 2 % EX PADS
6.0000 | MEDICATED_PAD | Freq: Every day | CUTANEOUS | Status: DC
  Administered 2019-07-30 – 2019-08-03 (×5): 6 via TOPICAL

## 2019-07-30 MED ORDER — GABAPENTIN 100 MG PO CAPS
100.0000 mg | ORAL_CAPSULE | Freq: Every day | ORAL | Status: DC
  Administered 2019-07-30: 100 mg via ORAL
  Filled 2019-07-30: qty 1

## 2019-07-30 NOTE — Progress Notes (Signed)
[facility] PHYSICAL MEDICINE & REHABILITATION PROGRESS NOTE   Subjective/Complaints:    Feels weak this am but reportedly slept ok Hx chronic neuropathy pain RLE was on gabapentin at home   ROS: Patient denies f nausea, vomiting, diarrhea, cough, shortness of breath or chest pain, joint or back pain,   Objective:   No results found. Recent Labs    07/29/19 0726  WBC 11.2*  HGB 11.7*  HCT 37.4  PLT PLATELET CLUMPS NOTED ON SMEAR, COUNT APPEARS DECREASED   Recent Labs    07/29/19 0726  NA 130*  K 5.1  CL 95*  CO2 18*  GLUCOSE 152*  BUN 78*  CREATININE 8.14*  CALCIUM 9.1    Intake/Output Summary (Last 24 hours) at 07/30/2019 0856 Last data filed at 07/29/2019 1759 Gross per 24 hour  Intake 100 ml  Output 1480 ml  Net -1380 ml     Physical Exam: Vital Signs Blood pressure (!) 93/53, pulse (!) 102, temperature 98.7 F (37.1 C), temperature source Oral, resp. rate 18, height 5\' 8"  (1.727 m), weight 51.3 kg, SpO2 97 %.  Constitutional: No distress . Vital signs reviewed.frail appearing HEENT: EOMI, oral membranes moist Neck: supple Cardiovascular: RRR without murmur. No JVD    Respiratory: CTA Bilaterally without wheezes or rales. Normal effort    GI: BS +, non-tender, non-distended   Musculoskeletal:  General: No edema.Normal range of motion.  Cervical back: Normal range of motion.  Neurological: She isalertand oriented to person, place, and time.quiet voice. Fair insight and awareness. Speech clear. Right central 7. Mild right inattention. No field cut. RUE 0/5 RLE 0/5. LUE and LLE 4- to 4/5. No motor changes  Senses pain and light touch in all 4's. DTR's 1+ Skin: She isnot diaphoretic. A few scattered abrasions RUE>LE.foam dressing RUE   Assessment/Plan: 1. 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  Physiatrist is providing  close team supervision and 24 hour management of active medical problems listed below.  Physiatrist and rehab team continue to assess barriers to discharge/monitor patient progress toward functional and medical goals  Care Tool:  Bathing    Body parts bathed by patient: Face, Right arm, Chest, Abdomen, Front perineal area   Body parts bathed by helper: Left arm, Buttocks, Right upper leg, Left upper leg, Right lower leg, Left lower leg     Bathing assist Assist Level: 2 Helpers     Upper Body Dressing/Undressing Upper body dressing   What is the patient wearing?: Pull over shirt    Upper body assist Assist Level: Maximal Assistance - Patient 25 - 49%    Lower Body Dressing/Undressing Lower body dressing      What is the patient wearing?: Pants, Incontinence brief     Lower body assist Assist for lower body dressing: Total Assistance - Patient < 25%     Toileting Toileting    Toileting assist Assist for toileting: Dependent - Patient 0%     Transfers Chair/bed transfer  Transfers assist     Chair/bed transfer assist level: Total Assistance - Patient < 25%     Locomotion Ambulation   Ambulation assist   Ambulation activity did not occur: Safety/medical concerns(poor postural control and decreased standing ability)          Walk 10 feet activity   Assist  Walk 10 feet activity did not occur: Safety/medical concerns        Walk  50 feet activity   Assist Walk 50 feet with 2 turns activity did not occur: Safety/medical concerns         Walk 150 feet activity   Assist Walk 150 feet activity did not occur: Safety/medical concerns         Walk 10 feet on uneven surface  activity   Assist Walk 10 feet on uneven surfaces activity did not occur: Safety/medical concerns         Wheelchair     Assist Will patient use wheelchair at discharge?: Yes Type of Wheelchair: Manual    Wheelchair assist level: Dependent - Patient  0% Max wheelchair distance: 150 ft    Wheelchair 50 feet with 2 turns activity    Assist        Assist Level: Dependent - Patient 0%   Wheelchair 150 feet activity     Assist      Assist Level: Dependent - Patient 0%   Blood pressure (!) 93/53, pulse (!) 102, temperature 98.7 F (37.1 C), temperature source Oral, resp. rate 18, height 5\' 8"  (1.727 m), weight 51.3 kg, SpO2 97 %.  Medical Problem List and Plan: 1.Right hemiplegiasecondary to left ACA territory infarctionas well as history of right thalamic CVA 2018 -patient may shower             -right WHO, PRAFO -ELOS/Goals: 18-21 days, min assist- team conf in am  -Patient is beginning CIR therapies today including PT, OT, and SLP     Note * ECHO  suggested vegetation or thrombus right atrium TEE suggested for follow up. To be done ?Tuesday - will check with  cardiology 2. Antithrombotics: -DVT/anticoagulation:SCDs -antiplatelet therapy: Aspirin 81 mg daily and Plavix 75 mg daily x3 weeks then aspirin alone 3. Pain Management:Tylenol as needed 4. Mood:Xanax 1 mg nightly -antipsychotic agents: N/A 5. Neuropsych: This patientiscapable of making decisions on herown behalf. 6. Skin/Wound Care:Routine skin checks -local skin care for abrasions -nutrition discussed  -benadryl prn for itching  -sarna lotion prn 7. Fluids/Electrolytes/Nutrition:Routine and outs with follow-up chemistries, Hyponatremia- per nephro 8. End-stage renal disease. Hemodialysis per renal services. HD after therapy day to allow for better therapy participation 9. Orthostasis. Continue ProAmatine 10 mg Monday Wednesday Friday 10. Hyperlipidemia. Crestor 11. Chronic diastolic congestive heart failure. Monitor for any signs of fluid overload. Daily weights   Filed Weights   07/29/19 1413 07/29/19 1759 07/30/19 0520  Weight: 52.5 kg 50.7 kg 51.3 kg     EF 25-30% with severe AS- fluid management per nephro 12. Diet-controlled diabetes mellitus, hemoglobin A1c 5.3. Blood sugar checks discontinued 13. History of tobacco abuse. Counseling 14. CAD with CABG 2010. Continue aspirin. No chest pain reported. 15. IBS/history of GI bleed. Continue Amitiza24 mcg daily - incont BM yesterday , pt encouraged  to contact nursing if she feels the urge     LOS: 3 days A FACE TO FACE EVALUATION WAS PERFORMED  Charlett Blake 07/30/2019, 8:56 AM

## 2019-07-30 NOTE — Progress Notes (Signed)
[facility] Progress Note   Dialysis Orders: MWF at [facility] 4hr, 300/600, EDW 51.5kg, 2K/2.25Ca, TDC, UFP #4, heparin 1000 - Mircera 200 q 2 weeks Right IJ TDC - failed AVF  Assessment/Plan: 1. Acute CVA: LACAdistribution, presumed embolic.Echo with reduced EF Mod AS ? thrombus vs vet - for TEE planned for 12/16. PersistentR hemiparesis, speech intact.Statin started. Adm to CIR 12/12 2. ESRD:Continue HD per MWF  Resume tight heparin Dr. Jonnie Finner spoke w/ Dr Posey Pronto , cath placed CK Vasc in September 2020. Regarding echo density attached to HD cath in R atrium, this is not unusual and would not require removal of cath. Next HD Wed 3. Hypertension/volume:Permissive HTN initially ->BP variable - Na low pre HD suggestive of need for some volume removal net UF almost 1.5 L Monday with post wt 50.7  4. Anemia:Hgb11.7 stable - no ESA needed - prev on high dose  5. Metabolic bone disease:Ca/Phos to goal. Continue home Sevelamer as binder.-un beknownst to her dialysis unit, she tells me she was also taking calcitriol at home -  iPTH in goal - will d/c calcitriol and switch to TIW hectorol - home calcitriol should not be resumed at d/c 6. T2DM 7.  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 Nutrition - high risk for weight loss - intake marginal at present - nepro added 9. Deconditioning/right hemiparesis - CIR adm  Myriam Jacobson, PA-C [name] [phone] 07/30/2019,11:33 AM  LOS: 3 days   Subjective:  Resting supine in bed. Denies issues on HD yesterday.   Objective Vitals:   07/29/19 2214 07/29/19 2235 07/29/19 2242 07/30/19 0520  BP: (!) 88/54 (!) 86/54 (!) 92/54 (!) 93/53  Pulse: (!) 109  (!) 110 (!) 102  Resp: 18 18 18 18   Temp: 99.2 F (37.3 C)  98.7 F (37.1 C) 98.7 F (37.1 C)  TempSrc: Oral  Oral Oral  SpO2: 90%  95% 97%  Weight:    51.3 kg   Height:       Physical Exam General: very frail, thin NAD alert and more interactive today Heart: RRR 3/6 murmur Lungs: no rales Abdomen: soft NT + BS Extremities: no LE edema right foot in brace Dialysis Access: right IJ [facility]  Neuro: right hemiplegia   Additional Objective Labs: Basic Metabolic Panel: Recent Labs  Lab 07/26/19 1158 07/26/19 1427 07/29/19 0726  NA 130* 133* 130*  K 4.7 4.7 5.1  CL 94* 94* 95*  CO2 23 25 18*  GLUCOSE 242* 192* 152*  BUN 58* 59* 78*  CREATININE 6.86* 6.68* 8.14*  CALCIUM 9.1 9.4 9.1  PHOS 2.5 2.4* 4.2   Liver Function Tests: Recent Labs  Lab 07/26/19 1158 07/26/19 1427 07/29/19 0726  ALBUMIN 2.7* 2.8* 2.8*   No results for input(s): LIPASE, AMYLASE in the last 168 hours. CBC: Recent Labs  Lab 07/24/19 0359 07/26/19 1158 07/26/19 1427 07/29/19 0726  WBC 10.4 10.4 10.8* 11.2*  HGB 11.8* 11.7* 11.7* 11.7*  HCT 38.4 36.4 36.5 37.4  MCV 112.0* 108.3* 109.0* 109.0*  PLT 128* 112* 120* PLATELET CLUMPS NOTED ON SMEAR, COUNT APPEARS DECREASED   Blood Culture    Component Value Date/Time   SDES BLOOD LEFT ARM 07/26/2019 1729   SDES BLOOD RIGHT ARM 07/26/2019 1729   SPECREQUEST  07/26/2019 1729    BOTTLES DRAWN AEROBIC ONLY Blood Culture adequate volume  SPECREQUEST  07/26/2019 1729    BOTTLES DRAWN AEROBIC ONLY Blood Culture adequate volume   CULT  07/26/2019 1729    NO GROWTH 3 DAYS Performed at [facility], [place] [address] [place], [place] [address]    CULT  07/26/2019 1729    NO GROWTH 3 DAYS Performed at [facility] [address], [place], [place] [address]    REPTSTATUS PENDING 07/26/2019 1729   REPTSTATUS PENDING 07/26/2019 1729    Cardiac Enzymes: No results for input(s): CKTOTAL, CKMB, CKMBINDEX, TROPONINI in the last 168 hours. CBG: Recent Labs  Lab 07/29/19 1148 07/29/19 1856 07/29/19 2145 07/30/19 0656 07/30/19 1124  GLUCAP 175* 125* 150* 121* 192*   Iron Studies: No results for  input(s): IRON, TIBC, TRANSFERRIN, FERRITIN in the last 72 hours. Lab Results  Component Value Date   INR 1.0 07/23/2019   INR 1.07 04/18/2018   INR 1.4 10/16/2008   Studies/Results: No results found. Medications:  . ALPRAZolam  1 mg Oral QHS  . aspirin EC  81 mg Oral Daily  . Chlorhexidine Gluconate Cloth  6 each Topical BID  . clopidogrel  75 mg Oral Daily  . feeding supplement (NEPRO CARB STEADY)  237 mL Oral BID BM  . feeding supplement (PRO-STAT SUGAR FREE 64)  30 mL Oral BID  . folic acid  1 mg Oral Daily  . gabapentin  100 mg Oral QHS  . insulin aspart  0-9 Units Subcutaneous TID WC  . lubiprostone  24 mcg Oral Q breakfast  . midodrine  10 mg Oral Q M,W,F-HD  . multivitamin  1 tablet Oral QHS  . rosuvastatin  20 mg Oral q1800  . sevelamer carbonate  1,600 mg Oral TID WC  . vitamin B-12  500 mcg Oral Daily

## 2019-07-30 NOTE — Progress Notes (Signed)
Occupational Therapy Session Note  Patient Details  Name: [name] MRN: [id number] Date of Birth: June 10, 1941  Today's Date: 07/30/2019 OT Individual Time: [id number] OT Individual Time Calculation (min): 57 min    Short Term Goals: Week 1:  OT Short Term Goal 1 (Week 1): Pt will maintain sitting balance with min assist to prepare for ADL tasks OT Short Term Goal 2 (Week 1): Pt will complete UB bathing with min assist OT Short Term Goal 3 (Week 1): Pt will complete LB bathing with max assist of 1 caregiver OT Short Term Goal 4 (Week 1): Pt will complete toilet transfer with max assist of one caregiver  Skilled Therapeutic Interventions/Progress Updates:    Treatment session with focus on static and dynamic sitting balance, attention to Rt, and bed mobility during self-care retraining. Pt received supine in bed incontinent of urine.  Engaged in [place] bathing at bed level with [place] and Lt.  Able to complete rolling with max-total assist and 2nd person to assist to maintain sidelying to allow therapist to complete hygiene and don incontinence brief.  Donned pants at bed level with rolling. Pt reports onset of dizziness when rolling to Lt.  Total assist sidelying to sitting to engage in UB bathing seated at EOB.  Pt with lateral lean Lt with inability to correct requiring mod-max assist for sitting balance during UB bathing and dressing.  Mod cues to visually scan to Rt to locate items and to engage in UB dressing, still requiring total assist when threading Rt sleeve.  Pt initially agreeable to sitting up in recliner vs w/c, however once setting up transfer pt reports pain in buttocks requesting to return to supine in bed.  Pt left supine with RUE supported for positioning and PRAFO on RLE.  Pt husband arriving as therapist exiting.  Therapy Documentation Precautions:  Precautions Precautions: Fall Precaution Comments: mild Rt inattention; R hemiplegia Restrictions Weight Bearing  Restrictions: No Pain:  Pt with c/o pain in RLE.  Reports history of neuropathy.  Premedicated   Therapy/Group: Individual Therapy  Simonne Come 07/30/2019, 9:48 AM

## 2019-07-30 NOTE — Plan of Care (Signed)
  Problem: Consults Goal: RH STROKE PATIENT EDUCATION Description: See Patient Education module for education specifics  Outcome: Progressing Goal: Nutrition Consult-if indicated Outcome: Progressing Goal: Diabetes Guidelines if Diabetic/Glucose > 140 Description: If diabetic or lab glucose is > 140 mg/dl - Initiate Diabetes/Hyperglycemia Guidelines & Document Interventions  Outcome: Progressing   Problem: RH BOWEL ELIMINATION Goal: RH STG MANAGE BOWEL WITH ASSISTANCE Description: STG Manage Bowel with Assistance. Outcome: Progressing Goal: RH STG MANAGE BOWEL W/MEDICATION W/ASSISTANCE Description: STG Manage Bowel with Medication with Assistance. Outcome: Progressing   Problem: RH BLADDER ELIMINATION Goal: RH STG MANAGE BLADDER WITH ASSISTANCE Description: STG Manage Bladder With Assistance Outcome: Progressing Goal: RH STG MANAGE BLADDER WITH MEDICATION WITH ASSISTANCE Description: STG Manage Bladder With Medication With Assistance. Outcome: Progressing   Problem: RH SKIN INTEGRITY Goal: RH STG SKIN FREE OF INFECTION/BREAKDOWN Outcome: Progressing Goal: RH STG MAINTAIN SKIN INTEGRITY WITH ASSISTANCE Description: STG Maintain Skin Integrity With Assistance. Outcome: Progressing Goal: RH STG ABLE TO PERFORM INCISION/WOUND CARE W/ASSISTANCE Description: STG Able To Perform Incision/Wound Care With Assistance. Outcome: Progressing   Problem: RH SAFETY Goal: RH STG ADHERE TO SAFETY PRECAUTIONS W/ASSISTANCE/DEVICE Description: STG Adhere to Safety Precautions With Assistance/Device. Outcome: Progressing Goal: RH STG DECREASED RISK OF FALL WITH ASSISTANCE Description: STG Decreased Risk of Fall With Assistance. Outcome: Progressing   Problem: RH COGNITION-NURSING Goal: RH STG USES MEMORY AIDS/STRATEGIES W/ASSIST TO PROBLEM SOLVE Description: STG Uses Memory Aids/Strategies With Assistance to Problem Solve. Outcome: Progressing Goal: RH STG ANTICIPATES NEEDS/CALLS FOR ASSIST  W/ASSIST/CUES Description: STG Anticipates Needs/Calls for Assist With Assistance/Cues. Outcome: Progressing   Problem: RH PAIN MANAGEMENT Goal: RH STG PAIN MANAGED AT OR BELOW PT'S PAIN GOAL Outcome: Progressing   Problem: RH KNOWLEDGE DEFICIT Goal: RH STG INCREASE KNOWLEDGE OF DIABETES Outcome: Progressing Goal: RH STG INCREASE KNOWLEDGE OF HYPERTENSION Outcome: Progressing Goal: RH STG INCREASE KNOWLEDGE OF DYSPHAGIA/FLUID INTAKE Outcome: Progressing Goal: RH STG INCREASE KNOWLEDGE OF STROKE PROPHYLAXIS Outcome: Progressing

## 2019-07-30 NOTE — Progress Notes (Signed)
Speech Language Pathology Daily Session Note  Patient Details  Name: [name] MRN: [id number] Date of Birth: 22-Nov-1940  Today's Date: 07/30/2019 SLP Individual Time: 1300-1400 SLP Individual Time Calculation (min): 60 min  Short Term Goals: Week 1: SLP Short Term Goal 1 (Week 1): Pt will use external aids to recall daily information with min assist verbal cues. SLP Short Term Goal 2 (Week 1): Pt will complete mildly complex tasks with min assist verbal cues for functional problem solving.  Skilled Therapeutic Interventions: Pt was seen for skilled ST targeting cognitive goals. Upon arrival, pt was upright eating lunch in bed. Supervision A verbal cues provided for basic problem solving and locating items on right of tray. In functional conversation, pt recalled activities of last ST session (yesterday) as well as insight into level of difficulty of that task (counting and making change) with Supervision A question cues. During a basic 3-step action card sequencing task, pt required only Min A for verbal problem solving, however increased Mod A verbal and visual cues required for hands on problem solving in order to rearrange picture to reflect correct sequence. Pt became slightly labile in regards to her current level of functioning, stating "this should be so simple." SLP educated pt regarding impact of stroke on cognition/basic problem solving and sequencing and assured pt we will continue to work toward progression of ST goals. She stated she manages medications and finances at baseline, thus SLP introduced idea of getting assistance from family with those complex tasks. Pt left laying in bed with alarm set and all needs within reach. RN present. Continue per current plan of care.        Pain Pain Assessment Pain Scale: Faces Faces Pain Scale: Hurts a little bit Pain Type: Acute pain Pain Location: Buttocks Pain Orientation: Left;Right Pain Descriptors / Indicators: Aching Pain  Onset: On-going Pain Intervention(s): Repositioned;Other (Comment)(placed pillow under buttocks) Multiple Pain Sites: No  Therapy/Group: Individual Therapy  Arbutus Leas 07/30/2019, 3:05 PM

## 2019-07-30 NOTE — Progress Notes (Signed)
Physical Therapy Session Note  Patient Details  Name: [name] MRN: [id number] Date of Birth: 1941/05/25  Today's Date: 07/30/2019 PT Individual Time: [id number] PT Individual Time Calculation (min): 58 min   Short Term Goals: Week 1:  PT Short Term Goal 1 (Week 1): Pt will be able to demonstrate functional dynamic sitting balance wit mod assist PT Short Term Goal 2 (Week 1): Pt will be able to perform slideboard transfer with mod assist +2 PT Short Term Goal 3 (Week 1): Pt will be able to tolerate standing with lift equipment x 5 min for weightbearing/postural control retraining  Skilled Therapeutic Interventions/Progress Updates:    Pt received supine in bed and agreeable to therapy session. Pt demonstrates no active movement in R LE. Performed supine bridging with therapist assisting with R LE placement/management and facilitating increased weight bearing 2sets 10 reps. Supine>sit, HOB slightly elevated, via logroll technique for increased pt participation and independence with max multimodal cuing for sequencing and max assist for B LE management and trunk upright. Sitting EOB pt requires max assist to maintain trunk upright due to pushing tendencies with L UE causing R lateral and anterior trunk LOB - therapist removed pt's L UE support with pt demonstrating decreased R lean but increased LOB in all directions (primarily anterior, right, and then posterior). Therapist provided max multimodal cuing for improved midline orientation with increased upright trunk and head posture due to significant thoracic rounding with downward head gaze. Progressed to performing R lateral trunk lean onto forearm support with pt requiring total assist for R UE positioning and max assist for trunk control to promoted increased R UE weight bearing. 2nd person arriving to provide physical assistance. Therapist provided max assist for trunk control and weight shifting while 2nd person place transfer board for  transfer to TIS w/c - required max/total assist of 1 person for lateral scoot transfer to w/c - cuing and manual facilitation for head/hips relationship and R LE positioning. Transported to/from gym in w/c total assist. Therapist providing max assist for weight shifting and trunk control while 2nd person placed transfer board to perform lateral scoot transfer to EOM with max/total assist of 1-2 persons. Sitting EOM pt continues to demonstrates some pushing tendencies with L UE - therapist removed L UE support and provided external target with manual facilitation for L lateral trunk lean and weight shift to touch her shoulder to therapy techs and pt able to maintain this position with CGA for steadying for ~20 seconds. Performed dynamic sitting balance and R UE NMR with weightbearing task of R lateral trunk lean onto forearm support to grasp cup with L UE and then return to midline to pass cup to therapy tech - pt required  max assist for trunk control and total assist for R UE management. L lateral scoot transfer EOM>TIS w/c using transfer board again requiring +2 assist for board placement and max/total assist of 1-2 for lateral scooting. Transported back to room in w/c. L lateral scoot w/c>EOB as described above. Sit>supine with max assist for trunk descent and B LE management. Pt left therapeutically positioned in partial L sideyling with RN present to assume care of patient.   Therapy Documentation Precautions:  Precautions Precautions: Fall Precaution Comments: mild Rt inattention; R hemiplegia Restrictions Weight Bearing Restrictions: No  Pain: Reports being "sore all over" RN notified and present for medication administration - therapist provided rest breaks and repositioning for pain management.    Therapy/Group: Individual Therapy  Linnet Bottari Francis Dowse, PT, DPT  07/30/2019, 2:32 PM

## 2019-07-30 NOTE — Progress Notes (Addendum)
Pt vitals have been abnormal throughout the night of checking them even when checking them manually. However pt has been asymptotic, Pt is a dialysis pt PA Dan notified.

## 2019-07-31 ENCOUNTER — Other Ambulatory Visit (HOSPITAL_COMMUNITY): Payer: Medicare HMO

## 2019-07-31 ENCOUNTER — Inpatient Hospital Stay (HOSPITAL_COMMUNITY): Payer: Medicare HMO

## 2019-07-31 ENCOUNTER — Inpatient Hospital Stay (HOSPITAL_COMMUNITY): Payer: Medicare HMO | Admitting: Physical Therapy

## 2019-07-31 ENCOUNTER — Inpatient Hospital Stay (HOSPITAL_COMMUNITY): Payer: Medicare HMO | Admitting: Occupational Therapy

## 2019-07-31 ENCOUNTER — Inpatient Hospital Stay (HOSPITAL_COMMUNITY): Payer: Medicare HMO | Admitting: Speech Pathology

## 2019-07-31 LAB — RENAL FUNCTION PANEL
Albumin: 2.6 g/dL — ABNORMAL LOW (ref 3.5–5.0)
Anion gap: 15 (ref 5–15)
BUN: 92 mg/dL — ABNORMAL HIGH (ref 8–23)
CO2: 24 mmol/L (ref 22–32)
Calcium: 8.7 mg/dL — ABNORMAL LOW (ref 8.9–10.3)
Chloride: 91 mmol/L — ABNORMAL LOW (ref 98–111)
Creatinine, Ser: 6.82 mg/dL — ABNORMAL HIGH (ref 0.44–1.00)
GFR calc Af Amer: 6 mL/min — ABNORMAL LOW (ref >60)
GFR calc non Af Amer: 5 mL/min — ABNORMAL LOW (ref >60)
Glucose, Bld: 383 mg/dL — ABNORMAL HIGH (ref 70–99)
Phosphorus: 3.2 mg/dL (ref 2.5–4.6)
Potassium: 4.9 mmol/L (ref 3.5–5.1)
Sodium: 130 mmol/L — ABNORMAL LOW (ref 135–145)

## 2019-07-31 LAB — CBC
HCT: 36.5 % (ref 36.0–46.0)
Hemoglobin: 11.3 g/dL — ABNORMAL LOW (ref 12.0–15.0)
MCH: 33.9 pg (ref 26.0–34.0)
MCHC: 31 g/dL (ref 30.0–36.0)
MCV: 109.6 fL — ABNORMAL HIGH (ref 80.0–100.0)
Platelets: 155 10*3/uL (ref 150–400)
RBC: 3.33 MIL/uL — ABNORMAL LOW (ref 3.87–5.11)
RDW: 17.2 % — ABNORMAL HIGH (ref 11.5–15.5)
WBC: 12.2 10*3/uL — ABNORMAL HIGH (ref 4.0–10.5)
nRBC: 0 % (ref 0.0–0.2)

## 2019-07-31 LAB — CULTURE, BLOOD (ROUTINE X 2)
Culture: NO GROWTH
Culture: NO GROWTH
Special Requests: ADEQUATE
Special Requests: ADEQUATE

## 2019-07-31 LAB — GLUCOSE, CAPILLARY
Glucose-Capillary: 116 mg/dL — ABNORMAL HIGH (ref 70–99)
Glucose-Capillary: 364 mg/dL — ABNORMAL HIGH (ref 70–99)
Glucose-Capillary: 105 mg/dL — ABNORMAL HIGH (ref 70–99)
Glucose-Capillary: 151 mg/dL — ABNORMAL HIGH (ref 70–99)

## 2019-07-31 MED ORDER — LIDOCAINE-PRILOCAINE 2.5-2.5 % EX CREA
1.0000 "application " | TOPICAL_CREAM | CUTANEOUS | Status: DC | PRN
  Filled 2019-07-31: qty 5

## 2019-07-31 MED ORDER — SODIUM CHLORIDE 0.9 % IV SOLN: 100.0000 mL | INTRAVENOUS | Status: DC | PRN

## 2019-07-31 MED ORDER — ALTEPLASE 2 MG IJ SOLR: 2.0000 mg | Freq: Once | INTRAMUSCULAR | Status: DC | PRN

## 2019-07-31 MED ORDER — LIDOCAINE HCL (PF) 1 % IJ SOLN
5.0000 mL | INTRAMUSCULAR | Status: DC | PRN
  Filled 2019-07-31: qty 5

## 2019-07-31 MED ORDER — HEPARIN SODIUM (PORCINE) 1000 UNIT/ML DIALYSIS
1000.0000 [IU] | INTRAMUSCULAR | Status: DC | PRN
  Filled 2019-07-31 (×2): qty 1

## 2019-07-31 MED ORDER — PENTAFLUOROPROP-TETRAFLUOROETH EX AERO: 1.0000 "application " | INHALATION_SPRAY | CUTANEOUS | Status: DC | PRN

## 2019-07-31 MED ORDER — GABAPENTIN 100 MG PO CAPS
200.0000 mg | ORAL_CAPSULE | Freq: Every day | ORAL | Status: DC
  Administered 2019-08-01 – 2019-08-03 (×3): 200 mg via ORAL
  Filled 2019-07-31 (×5): qty 2

## 2019-07-31 MED ORDER — HEPARIN SODIUM (PORCINE) 1000 UNIT/ML DIALYSIS
20.0000 [IU]/kg | INTRAMUSCULAR | Status: DC | PRN
  Filled 2019-07-31: qty 1

## 2019-07-31 MED ORDER — DEXTROSE 50 % IV SOLN
INTRAVENOUS | Status: AC
  Filled 2019-07-31: qty 50

## 2019-07-31 MED ORDER — SODIUM CHLORIDE 0.9 % IV SOLN
100.0000 mL | INTRAVENOUS | Status: DC | PRN
  Administered 2019-08-05: 100 mL via INTRAVENOUS

## 2019-07-31 MED ORDER — HEPARIN SODIUM (PORCINE) 1000 UNIT/ML IJ SOLN
INTRAMUSCULAR | Status: AC
  Filled 2019-07-31: qty 4

## 2019-07-31 NOTE — Progress Notes (Addendum)
Yellow 3 MEWS at 2052 with temp.100.5. Patient reports feeling, "fine." No cough or SOB. Removed 3 of the 4 covers. Multi loose/liquid stools today. As soon as staff cleans patient she has another stool. Paged Pam, PA related to above info. CXR completed. I & O cath for UA C&S since patient incontinent of urine. Initially urine thick cream colored, then cream/light green in color.  C-diff spec sent. Orders for Every 15 minutes x 2 hours.Patrici Ranks A

## 2019-07-31 NOTE — Progress Notes (Signed)
Pt is ST at times, just returned from HD. Will cont to monitor.   Erie Noe, RN

## 2019-07-31 NOTE — Progress Notes (Addendum)
Pt son (calls himself Sarye Buehrer  However on Emergency Contact says Nessiah Domiano )called and wanted to talk about how we could change the visitor so that he could bring his mother a 45ft christmas tree, and a chromebook. This Probation officer told son that it would have to be approved by [employer] and doctor due to [employer] COVID-19 restrictions. However sometimes exceptions can and can not be made. Pt son was very upset and stated "I work at [employer] tomorrow morning at 4:30 to help scrape the roads, after that I will be coming to [address] and seeing my mother, no one will be able to stop me. Security doesn't scare me , I will run down the hall and get to the fourth floor." This writer expressed to Aaron Edelman that it is better to be rational about the situation instead of being irrational and maybe a solution can be made. Aaron Edelman continue to make threats towards getting pass security and that he has friends in high places that can take care of security, also saying "I will go to jail in order to see my mother". This Probation officer still encouraged Aaron Edelman to make rational discussions. Pt hung up with "thank you I love you, call me back because I know that yall work 12 hours." Pt son was verbally aggressive, and tone was very angry using strong language . This Catering manager, so that she could Publishing copy and security.                                                                                                                                                                                                                                                                                                                                                                            +

## 2019-07-31 NOTE — Progress Notes (Signed)
[facility] PHYSICAL MEDICINE & REHABILITATION PROGRESS NOTE   Subjective/Complaints:  Discussed recommendation for TEE.  Pt and husband aware of this  Requiring laxatives for constipation .  Has some RLE "nerve Pain" Appreciate nsg note, pt son threatening to run through security to visit mother  ROS: Patient denies f nausea, vomiting, diarrhea, cough, shortness of breath or chest pain, joint or back pain,   Objective:   No results found. Recent Labs    07/29/19 0726  WBC 11.2*  HGB 11.7*  HCT 37.4  PLT PLATELET CLUMPS NOTED ON SMEAR, COUNT APPEARS DECREASED   Recent Labs    07/29/19 0726  NA 130*  K 5.1  CL 95*  CO2 18*  GLUCOSE 152*  BUN 78*  CREATININE 8.14*  CALCIUM 9.1    Intake/Output Summary (Last 24 hours) at 07/31/2019 0934 Last data filed at 07/31/2019 6222 Gross per 24 hour  Intake 440 ml  Output --  Net 440 ml     Physical Exam: Vital Signs Blood pressure (!) 124/56, pulse 97, temperature 98.4 F (36.9 C), temperature source Oral, resp. rate 18, height 5' 8"  (1.727 m), weight 51.5 kg, SpO2 97 %.  Constitutional: No distress . Vital signs reviewed.frail appearing HEENT: EOMI, oral membranes moist Neck: supple Cardiovascular: RRR without murmur. No JVD    Respiratory: CTA Bilaterally without wheezes or rales. Normal effort    GI: BS +, non-tender, non-distended   Musculoskeletal:  General: No edema.Normal range of motion.  Cervical back: Normal range of motion.  Neurological: She isalertand oriented to person, place, and time.quiet voice. Fair insight and awareness. Speech clear. Right central 7. Mild right inattention. No field cut. RUE 0/5 RLE 0/5. LUE and LLE 4- to 4/5. No motor changes  Senses pain and light touch in all 4's. DTR's 1+ Skin: She isnot diaphoretic. A few scattered abrasions RUE>LE.foam dressing RUE   Assessment/Plan: 1. 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  Physiatrist is providing close team supervision and 24 hour management of active medical problems listed below.  Physiatrist and rehab team continue to assess barriers to discharge/monitor patient progress toward functional and medical goals  Care Tool:  Bathing    Body parts bathed by patient: Face, Right arm, Chest, Abdomen, Front perineal area   Body parts bathed by helper: Left arm, Buttocks, Right upper leg, Left upper leg, Right lower leg, Left lower leg     Bathing assist Assist Level: 2 Helpers     Upper Body Dressing/Undressing Upper body dressing   What is the patient wearing?: Pull over shirt    Upper body assist Assist Level: Maximal Assistance - Patient 25 - 49%    Lower Body Dressing/Undressing Lower body dressing      What is the patient wearing?: Pants, Incontinence brief     Lower body assist Assist for lower body dressing: 2 Helpers     Toileting Toileting    Toileting assist Assist for toileting: Dependent - Patient 0%     Transfers Chair/bed transfer  Transfers assist     Chair/bed transfer assist level: 2 Helpers(slide board)     Locomotion Ambulation   Ambulation assist   Ambulation activity did not occur: Safety/medical concerns(poor postural control and decreased standing ability)          Walk 10 feet activity   Assist  Walk 10 feet activity did not occur: Safety/medical concerns  Walk 50 feet activity   Assist Walk 50 feet with 2 turns activity did not occur: Safety/medical concerns         Walk 150 feet activity   Assist Walk 150 feet activity did not occur: Safety/medical concerns         Walk 10 feet on uneven surface  activity   Assist Walk 10 feet on uneven surfaces activity did not occur: Safety/medical concerns         Wheelchair     Assist Will patient use wheelchair at discharge?: Yes Type of Wheelchair:  Manual    Wheelchair assist level: Dependent - Patient 0% Max wheelchair distance: 150 ft    Wheelchair 50 feet with 2 turns activity    Assist        Assist Level: Dependent - Patient 0%   Wheelchair 150 feet activity     Assist      Assist Level: Dependent - Patient 0%   Blood pressure (!) 124/56, pulse 97, temperature 98.4 F (36.9 C), temperature source Oral, resp. rate 18, height 5' 8"  (1.727 m), weight 51.5 kg, SpO2 97 %.  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 Formulized individual treatment plan based on medical history, underlying problem and comorbidities.  -Patient is beginning CIR therapies today including PT, OT, and SLP     Note * ECHO  suggested vegetation or thrombus right atrium TEE suggested for follow up. To be done ?Tuesday - this was ordered today  2. Antithrombotics: -DVT/anticoagulation:SCDs -antiplatelet therapy: Aspirin 81 mg daily and Plavix 75 mg daily x3 weeks then aspirin alone 3. Pain Management:Tylenol as needed 4. Mood:Xanax 1 mg nightly -antipsychotic agents: N/A 5. Neuropsych: This patientiscapable of making decisions on herown behalf. 6. Skin/Wound Care:Routine skin checks -local skin care for abrasions -nutrition discussed  -benadryl prn for itching  -sarna lotion prn 7. Fluids/Electrolytes/Nutrition:Routine and outs with follow-up chemistries, Hyponatremia- per nephro 8. End-stage renal disease. Hemodialysis per renal services. HD after therapy day to allow for better therapy participation 9. Orthostasis.  Continue ProAmatine 10 mg Monday Wednesday Friday 10. Hyperlipidemia. Crestor 11. Chronic diastolic congestive heart failure. Monitor for any signs of fluid overload. Daily weights   Filed Weights   07/29/19 1759 07/30/19 0520 07/31/19 0504  Weight: 50.7 kg 51.3 kg 51.5 kg    EF 25-30% with severe AS- fluid management per nephro 12. Diet-controlled diabetes mellitus, hemoglobin A1c 5.3. Blood sugar checks discontinued 13. History of tobacco abuse. Counseling 14. CAD with CABG 2010. Continue aspirin. No chest pain reported. 15. IBS/history of GI bleed. Continue Amitiza24 mcg daily - incont BM yesterday , pt encouraged  to contact nursing if she feels the urge   16.  Social son threatened to break through security to visit his mom   LOS: 4 days A FACE TO [place] 07/31/2019, 9:34 AM

## 2019-07-31 NOTE — Progress Notes (Signed)
Speech Language Pathology Daily Session Note  Patient Details  Name: [name] MRN: [id number] Date of Birth: Dec 26, 1940  Today's Date: 07/31/2019 SLP Individual Time: 1004-1100 SLP Individual Time Calculation (min): 56 min  Short Term Goals: Week 1: SLP Short Term Goal 1 (Week 1): Pt will use external aids to recall daily information with min assist verbal cues. SLP Short Term Goal 2 (Week 1): Pt will complete mildly complex tasks with min assist verbal cues for functional problem solving.  Skilled Therapeutic Interventions: Pt was seen for skilled ST targeting cognitive goals. Pt's husband was present throughout 3/4 of session - he was present and supportive, asked appropriate questions regarding cognitive changes post-stroke. SLP answered questions about SLP goals and progression to pt's husband's satisfaction. Also made husband aware there is more time for progress, as pt was just recently admitted and likely to stay through December. Pt was notably fatigued today (reported just took at benadryl), however agreeable to participate in [place]. She demonstrated recall of yesterday's ST session with Supervision A question cues. Max A verbal and visual cues were required for problem solving and working memory during basic medication management tasks (ALFA). SLP also used this opportunity to emphasize to pt's husband that pt will require assistance with medication management upon return home. Pt engaged in basic sorting task (field of 6 colors) with overall Min A for problem solving and 1 verbal cue for error awareness. Pt left laying in bed with alarm set and all needs within reach. Continue per current plan of care.       Pain Pain Assessment Pain Scale: 0-10 Pain Score: 0-No pain  Therapy/Group: Individual Therapy  Arbutus Leas 07/31/2019, 12:20 PM

## 2019-07-31 NOTE — Progress Notes (Signed)
[facility] Progress Note   Dialysis Orders: MWF at [facility], 300/600, EDW 51.5kg, 2K/2.25Ca, TDC, UFP #4, heparin 1000 - Mircera 200 q 2 weeks Right IJ TDC - failed AVF  Assessment/Plan: 1. Acute CVA: LACAdistribution, presumed embolic.Echo with reduced EF Mod AS ? thrombus vs vet - for TEE planned for 12/16. PersistentR hemiparesis, speech intact.Statin started. Adm to CIR 12/12 2. ESRD:Continue HD per MWF  Resume tight heparin Dr. Jonnie Finner spoke w/ Dr Posey Pronto , cath placed CK Vasc in September 2020. Regarding echo density attached to HD cath in R atrium, this is not unusual and would not require removal of cath. Next HD today 3. Hypertension/volume:Permissive HTN initially ->BP variable - Na low pre HD suggestive of need for some volume removal net UF almost 1.5 L Monday with post wt 50.7  4. Anemia:Hgb11.7 stable - no ESA needed - prev on high dose  5. Metabolic bone disease:Ca/Phos to goal. Continue home Sevelamer as binder.-un beknownst to her dialysis unit, she tells me she was also taking calcitriol at home -  iPTH in goal - will d/c calcitriol and switch to TIW hectorol - home calcitriol should not be resumed at d/c 6. T2DM 7.  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 Nutrition - high risk for weight loss - intake marginal at present - nepro added 9. Deconditioning/right hemiparesis - CIR adm 10. Dizziness with movement - for vestibular work up  Myriam Jacobson, PA-C [place] [phone] 07/31/2019,12:46 PM  LOS: 4 days   Subjective:  Very drowsy post lunch today.  Objective Vitals:   07/30/19 0520 07/30/19 1254 07/30/19 1930 07/31/19 0504  BP: (!) 93/53 (!) 110/54 (!) 109/59 (!) 124/56  Pulse: (!) 102 (!) 102 (!) 101 97  Resp: 18 17 19 18   Temp: 98.7 F (37.1 C) 98.2 F (36.8 C) 98.3 F (36.8 C) 98.4 F (36.9 C)  TempSrc: Oral  Oral  Oral  SpO2: 97% 95% 100% 97%  Weight: 51.3 kg   51.5 kg  Height:       Physical Exam General: very frail, thin NAD poorly engaged today Heart: RRR 3/6 murmur Lungs: no rales dim BS Abdomen: soft NT + BS Extremities: no LE edema right foot in brace Dialysis Access: right IJ [facility]  Neuro: right hemiplegia   Additional Objective Labs: Basic Metabolic Panel: Recent Labs  Lab 07/26/19 1158 07/26/19 1427 07/29/19 0726  NA 130* 133* 130*  K 4.7 4.7 5.1  CL 94* 94* 95*  CO2 23 25 18*  GLUCOSE 242* 192* 152*  BUN 58* 59* 78*  CREATININE 6.86* 6.68* 8.14*  CALCIUM 9.1 9.4 9.1  PHOS 2.5 2.4* 4.2   Liver Function Tests: Recent Labs  Lab 07/26/19 1158 07/26/19 1427 07/29/19 0726  ALBUMIN 2.7* 2.8* 2.8*   No results for input(s): LIPASE, AMYLASE in the last 168 hours. CBC: Recent Labs  Lab 07/26/19 1158 07/26/19 1427 07/29/19 0726  WBC 10.4 10.8* 11.2*  HGB 11.7* 11.7* 11.7*  HCT 36.4 36.5 37.4  MCV 108.3* 109.0* 109.0*  PLT 112* 120* PLATELET CLUMPS NOTED ON SMEAR, COUNT APPEARS DECREASED   Blood Culture    Component Value Date/Time   SDES BLOOD LEFT ARM 07/26/2019 1729   SDES BLOOD RIGHT ARM 07/26/2019 1729   SPECREQUEST  07/26/2019 1729    BOTTLES DRAWN AEROBIC ONLY Blood Culture adequate volume  SPECREQUEST  07/26/2019 1729    BOTTLES DRAWN AEROBIC ONLY Blood Culture adequate volume   CULT  07/26/2019 1729    NO GROWTH 5 DAYS Performed at [facility], [place] [address] [place], [place] [address]    CULT  07/26/2019 1729    NO GROWTH 5 DAYS Performed at [facility] [address] [place], [place] [address]    REPTSTATUS 07/31/2019 FINAL 07/26/2019 1729   REPTSTATUS 07/31/2019 FINAL 07/26/2019 1729    Cardiac Enzymes: No results for input(s): CKTOTAL, CKMB, CKMBINDEX, TROPONINI in the last 168 hours. CBG: Recent Labs  Lab 07/30/19 1627 07/30/19 2131 07/31/19 0629 07/31/19 0633 07/31/19 1213  GLUCAP 160* 248* 18* 116* 364*    Iron Studies: No results for input(s): IRON, TIBC, TRANSFERRIN, FERRITIN in the last 72 hours. Lab Results  Component Value Date   INR 1.0 07/23/2019   INR 1.07 04/18/2018   INR 1.4 10/16/2008   Studies/Results: No results found. Medications:  . ALPRAZolam  1 mg Oral QHS  . aspirin EC  81 mg Oral Daily  . Chlorhexidine Gluconate Cloth  6 each Topical Q0600  . clopidogrel  75 mg Oral Daily  . feeding supplement (NEPRO CARB STEADY)  237 mL Oral BID BM  . feeding supplement (PRO-STAT SUGAR FREE 64)  30 mL Oral BID  . folic acid  1 mg Oral Daily  . gabapentin  200 mg Oral QHS  . insulin aspart  0-9 Units Subcutaneous TID WC  . lubiprostone  24 mcg Oral Q breakfast  . midodrine  10 mg Oral Q M,W,F-HD  . multivitamin  1 tablet Oral QHS  . rosuvastatin  20 mg Oral q1800  . sevelamer carbonate  1,600 mg Oral TID WC  . vitamin B-12  500 mcg Oral Daily

## 2019-07-31 NOTE — Progress Notes (Signed)
Physical Therapy Session Note  Patient Details  Name: [name] MRN: [id number] Date of Birth: 01-Feb-1941  Today's Date: 07/31/2019 PT Individual Time: 0800-0900 PT Individual Time Calculation (min): 60 min   Short Term Goals: Week 1:  PT Short Term Goal 1 (Week 1): Pt will be able to demonstrate functional dynamic sitting balance wit mod assist PT Short Term Goal 2 (Week 1): Pt will be able to perform slideboard transfer with mod assist +2 PT Short Term Goal 3 (Week 1): Pt will be able to tolerate standing with lift equipment x 5 min for weightbearing/postural control retraining  Skilled Therapeutic Interventions/Progress Updates:   Pt received supine in bed and agreeable to PT.  Rolling R and L with max assist to the L and +2 assist to the L for lower body dressing with total assist. Supine>sit transfer with total assist and max cues for sequencing through log roll. SB transfer to [facility] with total A +2 assist for SB management.   Pt transported to rail in hall. Si<>stand x 2 with total assist from PT to stabilize the RLE as well as for trunk extension to achieve full standing. Pt noted to have significantly delayed activation of the LLE able to stand up to 30sec on the first bout and 20sec on the second. No report of orthostatic hyotension throughout.   PT adjusted WC to improved seated positioning to allow increased tolerance to sitting by improving head support improved LE positioning to reduce sacral pressure. Seated LE NMR with hip/knee flexion/extension in symmetry x 8 in the LLE and x 5 on the RLE. No active movement noted on the R and delayed activation on the L. Pt then reports need for urination.   Pt returned to room and performed SB transfer to bed with total assist for PT to block the RLE and improve lift off gluteal surface to prevent sheer on sacrum. Sit>supine completed with total A and left supine in bed with call bell in reach and all needs met.            Therapy  Documentation Precautions:  Precautions Precautions: Fall Precaution Comments: mild Rt inattention; R hemiplegia Restrictions Weight Bearing Restrictions: No   Pain: Pain Assessment Pain Scale: 0-10 Pain Score: 0-No pain    Therapy/Group: Individual Therapy  Lorie Phenix 07/31/2019, 9:24 AM

## 2019-07-31 NOTE — Patient Care Conference (Signed)
Inpatient RehabilitationTeam Conference and Plan of Care Update Date: 07/31/2019   Time: 10:20 AM   Patient Name: [name]      Medical Record Number: [id number]  Date of Birth: 1940-11-13 Sex: Female         Room/Bed: 4W25C/4W25C-01 Payor Info: Payor: [employer] MEDICARE / Plan: [place] MEDICARE HMO/PPO / Product Type: *No Product type* /    Admit Date/Time:  07/27/2019  6:07 PM  Primary Diagnosis:  <principal problem not specified>  Patient Active Problem List   Diagnosis Date Noted  . Small vessel cerebrovascular accident (CVA) ([facility]) 07/27/2019  . Stage I pressure ulcer of sacral region 07/25/2019  . TIA (transient ischemic attack) 07/23/2019  . CVA (cerebral vascular accident) ([place]) 07/23/2019  . Volume overload 05/27/2018  . Dyspnea 05/27/2018  . Acute respiratory failure with hypoxia ([place]) 05/27/2018  . Acute respiratory distress 05/27/2018  . Acute encephalopathy 05/27/2018  . Aspiration pneumonia ([place]) 05/27/2018  . Pressure ulcer 05/19/2018  . Acute on chronic combined systolic and diastolic heart failure ([place]) 05/17/2018  . LBBB (left bundle branch block) 05/17/2018  . Aortic stenosis, moderate 05/17/2018  . NSTEMI (non-ST elevated myocardial infarction) ([place]) 05/16/2018  . Symptomatic anemia 05/15/2018  . Demand ischemia ([place]) 05/15/2018  . Protein-calorie malnutrition, severe 04/20/2018  . Pressure injury of skin 04/18/2018  . GI bleed 10/31/2016  . Blood loss anemia 10/31/2016  . AKI (acute kidney injury) ([place]) 10/31/2016  . Stroke ([place]) 09/26/2016  . ESRD (end stage renal disease) on dialysis ([place]) 06/23/2015  . Balance problem 12/22/2013  . Edema 12/28/2011  . CLAUDICATION 11/01/2010  . CAROTID ARTERY STENOSIS 10/29/2009  . AORTIC STENOSIS 09/17/2009  . Essential hypertension 11/17/2008  . DIASTOLIC HEART FAILURE, CHRONIC 11/17/2008  . Diabetes mellitus type II, non insulin dependent ([place]) 11/12/2008  . Other and unspecified hyperlipidemia 11/12/2008  .  Anemia, chronic renal failure 11/12/2008  . Thrombocytopenia ([place]) 11/12/2008  . Anxiety state 11/12/2008  . Coronary atherosclerosis 11/12/2008  . UNSPEC COMBINED SYSTOLIC&DIASTOLIC HEART FAILURE 0000000  . ESOPHAGEAL REFLUX 11/12/2008  . Chronic kidney disease (CKD), stage V ([place]) 11/12/2008  . LOW BACK PAIN, CHRONIC 11/12/2008  . INSOMNIA UNSPECIFIED 11/12/2008    Expected Discharge Date: Expected Discharge Date: 08/20/19  Team Members Present: Physician leading conference: Dr. Alysia Penna Social Worker Present: Ovidio Kin, LCSW Nurse Present: Rosita Fire, RN Case Manager: Karene Fry, RN PT Present: Barrie Folk, PT;Rosita Dechalus, PTA OT Present: [name], OT SLP Present: Charolett Bumpers, SLP PPS Coordinator present : Gunnar Fusi, SLP     Current Status/Progress Goal Weekly Team Focus  Bowel/Bladder   pt is incontinent of b/b, lmb 07/27/2019  timed toileting  assess toileting q shift and prn   Swallow/Nutrition/ Hydration             ADL's   Max-total +2 bathing and dressing and transfers  Min A (more than likely lofty)  ADL retraining, sitting balance, Rt attention, RUE NMR/position, pt/family education   Mobility   max assist bed mobility,max assist sitting balance, +2 total assist slide board transfers to TIS w/c  min assist bed mobility and transfers, mod assist gait 16ft using LRAD, supervision w/c propulsion 146ft  bed mobility, transfer training, activity tolerance, static and dynamic sitting balance, midline orientation, R hemibody neuromuscular re-education, pt education   Communication             Safety/Cognition/ Behavioral Observations  Min-Mod A basic tasks (verbal problem solving > functional)  Supervision A  basic  problem solving, day to day recall and use of compensatory memory strategies   Pain   pt c/o pain, has prn  keep pain less than 5  assess pain q shift and prn   Skin   pt has a stage 2 on buttocks, and MSD on grion  remain  free of new breakdown  assess skin qshift and prn      *See Care Plan and progress notes for long and short-term goals.     Barriers to Discharge  Current Status/Progress Possible Resolutions Date Resolved   Nursing                  PT  Decreased caregiver support;Hemodialysis  husband is limited physically; son available PRN but is also primary caregiver for his wife with stage IV cancer              OT Home environment access/layout;Incontinence  2 steps down to den and 2 steps in to home             SLP                SW                Discharge Planning/Teaching Needs:  Home with husband and son to assist, husband was planning to be here daily and provide support, unusre with restrictions now. All aware insurance will not cover NH since came to CIR      Team Discussion: No movement R side, poor truncal stability, HD, son was threatening last night.  RN stage 2 on bottom, HD today, BP down after HD.  OT max/total self care, w/c and bed level, max A rolling, transfers max total A, dizzyness yesterday with rolling.  PT max/tot rolling, tot EOB, +2 tot pivot scoot, stood tot A, down grade goals from in to mod a.  SLP cognition min/mod, goals S.  Son/husband will need help.  May need hoyer lift and fam ed.   Revisions to Treatment Plan: N/A     Medical Summary Current Status: TEE pending , low BP after HD, no improvement in hemiparesis Weekly Focus/Goal: trunk control, HD related BP issues  Barriers to Discharge: Hemodialysis;Medical stability   Possible Resolutions to Barriers: training of pt family using equipment such as hoyer lift   Continued Need for Acute Rehabilitation Level of Care: The patient requires daily medical management by a physician with specialized training in physical medicine and rehabilitation for the following reasons: Direction of a multidisciplinary physical rehabilitation program to maximize functional independence : Yes Medical management of patient  stability for increased activity during participation in an intensive rehabilitation regime.: Yes Analysis of laboratory values and/or radiology reports with any subsequent need for medication adjustment and/or medical intervention. : Yes   I attest that I was present, lead the team conference, and concur with the assessment and plan of the team.   Retta Diones 07/31/2019, 3:23 PM  Team conference was held via web/ teleconference due to [place] - 19

## 2019-07-31 NOTE — Progress Notes (Signed)
Occupational Therapy Session Note  Patient Details  Name: [name] MRN: [id number] Date of Birth: [birth year]  Today's Date: 07/31/2019 OT Individual Time: 1101-1200 OT Individual Time Calculation (min): 59 min    Short Term Goals: Week 1:  OT Short Term Goal 1 (Week 1): Pt will maintain sitting balance with min assist to prepare for ADL tasks OT Short Term Goal 2 (Week 1): Pt will complete UB bathing with min assist OT Short Term Goal 3 (Week 1): Pt will complete LB bathing with max assist of 1 caregiver OT Short Term Goal 4 (Week 1): Pt will complete toilet transfer with max assist of one caregiver  Skilled Therapeutic Interventions/Progress Updates:    Pt completed bathing and dressing during session.  She began in bed with completion of washing buttocks and peri area while donning new brief.  Total assist for rolling side to side secondary to right hemiparesis as well as decreased initiation.  She needed total assist for washing both front and peri area and donning new brief.  Total assist for transition to sitting to work on washing her UB and her legs.  Max assist for static sitting balance with increased slight pushing to the right noted and severely flexed trunk.  She resists assistance to correct balance to midline secondary to perceptual deficits.  She needed max assist for washing her lower legs with therapy tech providing trunk support in sitting.  No active movement noted in the RUE during any task, requiring hand over hand assistance with the LUE for positioning with transitional movements.  Finished session with return to supine with total assist +2 (pt 20%) and pt left in bed with call button and phone in reach and bed alarm in place.    Therapy Documentation Precautions:  Precautions Precautions: Fall Precaution Comments: mild Rt inattention; R hemiplegia Restrictions Weight Bearing Restrictions: No  Pain: Pain Assessment Pain Scale: 0-10 Pain Score: 0-No  pain ADL: See Care Tool Section for some details of mobility and selfcare  Therapy/Group: Individual Therapy  Taichi Repka OTR/L 07/31/2019, 12:24 PM

## 2019-07-31 NOTE — Progress Notes (Signed)
Per Larene Beach in Endo, pt will have TEE done on 12/18. Will need to be NPO at [facility] on Thursday 12/17.  Erie Noe, RN

## 2019-07-31 NOTE — Progress Notes (Signed)
Social Work Patient ID: [name], female   DOB: 1941-04-03, 79 y.o.   MRN: [id number]  [place] with husband vais telephone to discuss team conference goals min-mod level of assist and target discharge date 1/5. Husband feels he will need help with her care and have encouraged him to begin looking into this. Informed him what Medicare covers and want it does not. He will have their son contact me with questions. Will work on discharge needs. Pt has had a severe CVA and will need much care at discharge from here. Husband voiced he has health issues and needs surgery on his knees. Work on a safe plan for pt and husband.

## 2019-08-01 ENCOUNTER — Inpatient Hospital Stay (HOSPITAL_COMMUNITY): Payer: Medicare HMO | Admitting: Occupational Therapy

## 2019-08-01 ENCOUNTER — Inpatient Hospital Stay (HOSPITAL_COMMUNITY): Payer: Medicare HMO | Admitting: Speech Pathology

## 2019-08-01 ENCOUNTER — Inpatient Hospital Stay (HOSPITAL_COMMUNITY): Payer: Medicare HMO | Admitting: Physical Therapy

## 2019-08-01 ENCOUNTER — Inpatient Hospital Stay (HOSPITAL_COMMUNITY): Payer: Medicare HMO

## 2019-08-01 LAB — URINALYSIS, ROUTINE W REFLEX MICROSCOPIC
Bilirubin Urine: NEGATIVE
Glucose, UA: NEGATIVE mg/dL
Hgb urine dipstick: NEGATIVE
Ketones, ur: 5 mg/dL — AB
Nitrite: NEGATIVE
Protein, ur: 100 mg/dL — AB
Specific Gravity, Urine: 1.02 (ref 1.005–1.030)
WBC, UA: >50 WBC/hpf — ABNORMAL HIGH (ref 0–5)
pH: 7 (ref 5.0–8.0)

## 2019-08-01 LAB — CBC WITH DIFFERENTIAL/PLATELET
Abs Immature Granulocytes: 0.06 10*3/uL (ref 0.00–0.07)
Basophils Absolute: 0.1 10*3/uL (ref 0.0–0.1)
Basophils Relative: 1 %
Eosinophils Absolute: 0.1 10*3/uL (ref 0.0–0.5)
Eosinophils Relative: 1 %
HCT: 35.9 % — ABNORMAL LOW (ref 36.0–46.0)
Hemoglobin: 11.4 g/dL — ABNORMAL LOW (ref 12.0–15.0)
Immature Granulocytes: 1 %
Lymphocytes Relative: 12 %
Lymphs Abs: 1.4 10*3/uL (ref 0.7–4.0)
MCH: 34.1 pg — ABNORMAL HIGH (ref 26.0–34.0)
MCHC: 31.8 g/dL (ref 30.0–36.0)
MCV: 107.5 fL — ABNORMAL HIGH (ref 80.0–100.0)
Monocytes Absolute: 1.8 10*3/uL — ABNORMAL HIGH (ref 0.1–1.0)
Monocytes Relative: 16 %
Neutro Abs: 7.7 10*3/uL (ref 1.7–7.7)
Neutrophils Relative %: 69 %
Platelets: 153 10*3/uL (ref 150–400)
RBC: 3.34 MIL/uL — ABNORMAL LOW (ref 3.87–5.11)
RDW: 17.2 % — ABNORMAL HIGH (ref 11.5–15.5)
WBC: 11.1 10*3/uL — ABNORMAL HIGH (ref 4.0–10.5)
nRBC: 0 % (ref 0.0–0.2)

## 2019-08-01 LAB — URINE CULTURE: Special Requests: NORMAL

## 2019-08-01 LAB — GLUCOSE, CAPILLARY
Glucose-Capillary: 18 mg/dL — CL (ref 70–99)
Glucose-Capillary: 126 mg/dL — ABNORMAL HIGH (ref 70–99)
Glucose-Capillary: 246 mg/dL — ABNORMAL HIGH (ref 70–99)
Glucose-Capillary: 171 mg/dL — ABNORMAL HIGH (ref 70–99)
Glucose-Capillary: 281 mg/dL — ABNORMAL HIGH (ref 70–99)

## 2019-08-01 LAB — C DIFFICILE QUICK SCREEN W PCR REFLEX
C Diff antigen: NEGATIVE
C Diff interpretation: NOT DETECTED
C Diff toxin: NEGATIVE

## 2019-08-01 MED ORDER — CHLORHEXIDINE GLUCONATE CLOTH 2 % EX PADS
6.0000 | MEDICATED_PAD | Freq: Every day | CUTANEOUS | Status: DC
  Administered 2019-08-02 – 2019-08-04 (×3): 6 via TOPICAL

## 2019-08-01 MED ORDER — GERHARDT'S BUTT CREAM
TOPICAL_CREAM | Freq: Four times a day (QID) | CUTANEOUS | Status: DC
  Administered 2019-08-12 – 2019-08-23 (×3): 1 via TOPICAL
  Filled 2019-08-01 (×3): qty 1

## 2019-08-01 MED ORDER — CEPHALEXIN 250 MG PO CAPS
250.0000 mg | ORAL_CAPSULE | Freq: Two times a day (BID) | ORAL | Status: DC
  Administered 2019-08-01 (×2): 250 mg via ORAL
  Filled 2019-08-01 (×2): qty 1

## 2019-08-01 NOTE — Progress Notes (Addendum)
[facility] PHYSICAL MEDICINE & REHABILITATION PROGRESS NOTE   Subjective/Complaints:  Low grade temp last noc no resp sx, CXR neg, freq loose stools but C dif neg , UA +   Called by PT for concerns of leakage from vagina  PVR is 81ml  ROS: Patient denies f nausea, vomiting, diarrhea, cough, shortness of breath or chest pain, joint or back pain,   Objective:   DG Chest 2 View  Result Date: 07/31/2019 CLINICAL DATA:  Fever EXAM: CHEST - 2 VIEW COMPARISON:  July 23, 2019 FINDINGS: The heart size is stable from prior study. The patient is status post prior median sternotomy. The right-sided tunneled dialysis catheter is well positioned. There is no pneumothorax. No large pleural effusion. No focal infiltrate. IMPRESSION: Stable appearance of the chest.  No acute cardiopulmonary process. Electronically Signed   By: Constance Holster M.D.   On: 07/31/2019 22:52   Recent Labs    07/31/19 1451  WBC 12.2*  HGB 11.3*  HCT 36.5  PLT 155   Recent Labs    07/31/19 1451  NA 130*  K 4.9  CL 91*  CO2 24  GLUCOSE 383*  BUN 92*  CREATININE 6.82*  CALCIUM 8.7*    Intake/Output Summary (Last 24 hours) at 08/01/2019 0758 Last data filed at 08/01/2019 0700 Gross per 24 hour  Intake 320 ml  Output 870 ml  Net -550 ml     Physical Exam: Vital Signs Blood pressure (!) 89/55, pulse 99, temperature 97.8 F (36.6 C), resp. rate 20, height 5\' 8"  (1.727 m), weight 49.3 kg, SpO2 95 %.  Constitutional: No distress . Vital signs reviewed.frail appearing HEENT: EOMI, oral membranes moist Neck: supple Cardiovascular: RRR without murmur. No JVD    Respiratory: CTA Bilaterally without wheezes or rales. Normal effort    GI: BS +, non-tender, non-distended   Musculoskeletal:  General: No edema.Normal range of motion.  Cervical back: Normal range of motion.  Neurological: She isalertand oriented to person, place, and time.quiet voice. Fair insight and awareness. Speech  clear. Right central 7. Mild right inattention. No field cut. RUE 0/5 RLE 0/5. LUE and LLE 4- to 4/5. No motor changes  Senses pain and light touch in all 4's. DTR's 1+ Skin: She isnot diaphoretic. A few scattered abrasions RUE>LE.foam dressing RUE GU: No external labial lesions, urethral meatus mildly swollen.   There is thin lt brown slow continuous d/c from vaginal area,   Assessment/Plan: 1. 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  Physiatrist is providing close team supervision and 24 hour management of active medical problems listed below.  Physiatrist and rehab team continue to assess barriers to discharge/monitor patient progress toward functional and medical goals  Care Tool:  Bathing    Body parts bathed by patient: Chest, Abdomen, Right arm, Face   Body parts bathed by helper: Left arm, Front perineal area, Buttocks, Right lower leg, Left lower leg, Right upper leg, Left upper leg     Bathing assist Assist Level: 2 Helpers(supine in bed to sit)     Upper Body Dressing/Undressing Upper body dressing   What is the patient wearing?: Hospital gown only    Upper body assist Assist Level: Maximal Assistance - Patient 25 - 49%    Lower Body Dressing/Undressing Lower body dressing      What is the patient wearing?: Incontinence brief     Lower body assist Assist for  lower body dressing: Dependent - Patient 0%(supine rolling)     Toileting Toileting    Toileting assist Assist for toileting: Dependent - Patient 0%     Transfers Chair/bed transfer  Transfers assist     Chair/bed transfer assist level: Total Assistance - Patient < 25%     Locomotion Ambulation   Ambulation assist   Ambulation activity did not occur: Safety/medical concerns(poor postural control and decreased standing ability)          Walk 10 feet  activity   Assist  Walk 10 feet activity did not occur: Safety/medical concerns        Walk 50 feet activity   Assist Walk 50 feet with 2 turns activity did not occur: Safety/medical concerns         Walk 150 feet activity   Assist Walk 150 feet activity did not occur: Safety/medical concerns         Walk 10 feet on uneven surface  activity   Assist Walk 10 feet on uneven surfaces activity did not occur: Safety/medical concerns         Wheelchair     Assist Will patient use wheelchair at discharge?: Yes Type of Wheelchair: Manual    Wheelchair assist level: Dependent - Patient 0% Max wheelchair distance: 150 ft    Wheelchair 50 feet with 2 turns activity    Assist        Assist Level: Dependent - Patient 0%   Wheelchair 150 feet activity     Assist      Assist Level: Dependent - Patient 0%   Blood pressure (!) 89/55, pulse 99, temperature 97.8 F (36.6 C), resp. rate 20, height 5\' 8"  (1.727 m), weight 49.3 kg, SpO2 95 %.  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 To be done ?Tuesday - this was ordered today  2. Antithrombotics: -DVT/anticoagulation:SCDs -antiplatelet therapy: Aspirin 81 mg daily and Plavix 75 mg daily x3 weeks then aspirin alone 3. Pain Management:Tylenol as needed 4. Mood:Xanax 1 mg nightly -antipsychotic agents: N/A 5. Neuropsych: This patientiscapable of making decisions on herown behalf. 6. Skin/Wound Care:Routine skin checks -local skin care for abrasions -nutrition  discussed  -benadryl prn for itching  -sarna lotion prn 7. Fluids/Electrolytes/Nutrition:Routine and outs with follow-up chemistries, Hyponatremia- per nephro 8. End-stage renal disease. Hemodialysis per renal services. HD after therapy day to allow for better therapy participation 9. Orthostasis. Continue ProAmatine 10 mg Monday Wednesday Friday 10. Hyperlipidemia. Crestor 11. Chronic diastolic congestive heart failure. Monitor for any signs of fluid overload. Daily weights   Filed Weights   07/31/19 1315 07/31/19 1718 08/01/19 0514  Weight: 51.6 kg 50.9 kg 49.3 kg    EF 25-30% with severe AS- fluid management per nephro 12. Diet-controlled diabetes mellitus, hemoglobin A1c 5.3. Blood sugar checks discontinued 13. History of tobacco abuse. Counseling 14. CAD with CABG 2010. Continue aspirin. No chest pain reported. 15. IBS/history of GI bleed. hod Amitiza24 mcg daily - due to diarrhea 16.  Social son threatened to break through security to visit his mom  7.  Leukocytosis WBC 12.2 likely due to  UTI empiric keflex renal dose  18.  Diarrhea- C diff neg likely Amitiza took this every other day at home to avoid diarrhea will hold until stools are reduced  19.  Vaginal d/c ? Liquid stool vs infected urine- ask Gyn to eval  LOS: 5 days A FACE TO FACE EVALUATION WAS PERFORMED  [name] 08/01/2019, 7:58 AM

## 2019-08-01 NOTE — Plan of Care (Signed)
Pt's plan of care adjusted to 15/7 after speaking with care team and discussing with MD as pt currently unable to tolerate current therapy schedule with OT, PT, and SLP.   Page Spiro, PT, DPT

## 2019-08-01 NOTE — Progress Notes (Addendum)
Patient has orders for TEE in am ;Pam PA notified  Order for  NPO noted.

## 2019-08-01 NOTE — Progress Notes (Signed)
Occupational Therapy Session Note  Patient Details  Name: [name] MRN: [id number] Date of Birth: 12-30-1940  Today's Date: 08/01/2019 OT Individual Time: [id number] OT Individual Time Calculation (min): 39 min    Short Term Goals: Week 1:  OT Short Term Goal 1 (Week 1): Pt will maintain sitting balance with min assist to prepare for ADL tasks OT Short Term Goal 2 (Week 1): Pt will complete UB bathing with min assist OT Short Term Goal 3 (Week 1): Pt will complete LB bathing with max assist of 1 caregiver OT Short Term Goal 4 (Week 1): Pt will complete toilet transfer with max assist of one caregiver  Skilled Therapeutic Interventions/Progress Updates:   Treatment session with focus on Rt attention, sitting balance, and bed mobility.  Pt received supine in bed reporting not feeling well due to multiple loose stools.  Pt agreeable to therapy session.  Max-total assist rolling to Lt and sidelying to sitting.  Engaged in reaching activity seated EOB with focus on trunk control and Rt attention.  Pt continues to demonstrate extreme Lt and posterior lean in sitting.  Focus on reaching with LUE across midline to facilitate weight shift to Rt.  Pt able to maintain midline sitting posture ~5-10 seconds with close supervision, but quickly loses balance.  Returned to supine.  Pt incontinent of bowel.  Engaged in rolling to complete hygiene with +2 to assist pt positioning to allow for thorough cleansing.  Notified RN of skin concerns on buttocks with frequent BM - requested barrier cream or alternative.  Encouraged pt to remain in sidelying and brief off to allow for increased circulation and pressure relief to buttocks.   Per verbal communication with MD - bedside therapies today.  Therapy Documentation Precautions:  Precautions Precautions: Fall Precaution Comments: mild Rt inattention; R hemiplegia Restrictions Weight Bearing Restrictions: No Pain: Pain Assessment Pain Scale: 0-10 Pain  Score: 6  Pain Type: Acute pain Pain Location: Leg Pain Orientation: Right;Left Pain Descriptors / Indicators: Aching Pain Frequency: Intermittent Pain Onset: On-going Pain Intervention(s): Medication (See eMAR)   Therapy/Group: Individual Therapy  Simonne Come 08/01/2019, 12:29 PM

## 2019-08-01 NOTE — Progress Notes (Signed)
Occupational Therapy Note  Patient Details  Name: [name] MRN: [id number] Date of Birth: 01-Dec-1940  Pt's plan of care adjusted to 15/7 after speaking with care team and discussed with MD as pt currently unable to tolerate current therapy schedule with OT, PT, and SLP.    Simonne Come 08/01/2019, 12:27 PM

## 2019-08-01 NOTE — Progress Notes (Signed)
C-diff negative, enteric precautions discontinued. HS Scheduled Xanax and neurontin held, patient drowsy. Continues with multi brown, liquid stools. Cream applied to sacrum, because foam dressing won't stay clean or in place. Bilateral heels boggy. Patrici Ranks A

## 2019-08-01 NOTE — Consult Note (Signed)
Urology Consult  Referring physician: Dr. Letta Pate Reason for referral: possible colovesical fistula  Chief Complaint: dysuria  History of Present Illness: [name] is a 78yo with a hx of ESRD, CAD and CVA admitted 12/8 with CVA. During this hospitalization she was noted to have purulent drainage from her urethra and there was a concern for possible colovesical fistula. Per patient she makes small amounts of urine but over the past several months she has noted brown purulent urine. She has associated urinary urgency and dysuria. No gross hematuria. No fecaluria. No pneumatuira. CT stone study shows no GU calculi, but she does have a thickened bladder wall with stranding and gas in the bladder consistent with cystitis. She is having low grade fevers. No other associated symptoms. No exacerbating/alleviating events.  Past Medical History:  Diagnosis Date  . Anemia    Pt is taking iron.   [name] Anxiety   . Arthritis   . Carotid stenosis    40-59% bilateral ICA stenosis in 2/12.  . Chronic low back pain   . CKD (chronic kidney disease)    Dr. Audie Clear at [facility]  . Coronary artery disease    Pt presented 2/10 to [facility] with NSTEMI and diastolic CHF exacerbation.  LHC was done  3/10 showing 99% pRCA stenosis and 80% calcified pLAD stenosis with L=>R collaterals.  Pt was referred  for CABG which was done by Dr. Prescott Gum with LIMA-LAD, SVG-RCA, SVG-OM.  . Diabetes mellitus   . Diabetic neuropathy ([place])   . Diastolic CHF (HCC)    Echo (2/10) showed EF 55-65%, mild LVH, diastolic dysfunction, mild AS with mean gradient 12 mmHg, PASP 43 mmHg.  Echo (2/12): EF 55-60%, mild LVH, mild AS (mean gradient 12), PA systolic pressure 32 mmHg.     [name] GERD (gastroesophageal reflux disease)   . Heart murmur   . Hyperlipidemia   . Hypertension   . Mild aortic stenosis    mean gradient 12 mmHg in 2/12.  . Myocardial infarction ([place])    "mild"  . Pneumonia   . PONV (postoperative nausea and  vomiting)   . Stroke [facility])    " mild"  . Thrombocytopenia ([place])   . Unsteady gait    Past Surgical History:  Procedure Laterality Date  . AORTIC ARCH ANGIOGRAPHY N/A 10/18/2018   Procedure: AORTIC ARCH ANGIOGRAPHY;  Surgeon: Marty Heck, MD;  Location: [place] [facility];  Service: Cardiovascular;  Laterality: N/A;  . AV FISTULA PLACEMENT Left 01/30/2015   Procedure: Creation of a Radial Cephalic AV Fistula left wrist;  Surgeon: [name], MD;  Location: [place];  Service: Vascular;  Laterality: Left;  . AV FISTULA PLACEMENT Left 09/06/2018   Procedure: LEFT ARM ARTERIOVENOUS (AV) FISTULA CREATION;  Surgeon: Marty Heck, MD;  Location: [place];  Service: Vascular;  Laterality: Left;  . BACK SURGERY     multiple  . BREAST SURGERY     biopsy  . CARDIAC CATHETERIZATION    . CATARACT EXTRACTION W/ INTRAOCULAR LENS  IMPLANT, BILATERAL    . COLONOSCOPY Left 11/03/2016   Procedure: COLONOSCOPY;  Surgeon: [name], MD;  Location: [facility] ENDOSCOPY;  Service: Endoscopy;  Laterality: Left;  . COLONOSCOPY W/ BIOPSIES AND POLYPECTOMY    . CORONARY ARTERY BYPASS GRAFT  09/2008   pt with NSTEMI and diastolic CHF exacerbation.  LHC was done  3/10 showing 99% pRCA stenosis and 80% calcified pLAD stenosis with L=>R collaterals.  Pt was referred  for CABG  which was done by Dr. Prescott Gum with LIMA-LAD, SVG-RCA, SVG-OM.  [name] ESOPHAGOGASTRODUODENOSCOPY N/A 11/02/2016   Procedure: ESOPHAGOGASTRODUODENOSCOPY (EGD);  Surgeon: [name], MD;  Location: [facility] ENDOSCOPY;  Service: Endoscopy;  Laterality: N/A;  . GIVENS CAPSULE STUDY N/A 11/29/2016   Procedure: GIVENS CAPSULE STUDY;  Surgeon: [name], MD;  Location: [place];  Service: Endoscopy;  Laterality: N/A;  . LIGATION OF ARTERIOVENOUS  FISTULA Left 09/13/2018   Procedure: LIGATION OF LEFT ARTERIOVENOUS  FISTULA;  Surgeon: Waynetta Sandy, MD;  Location: [place];  Service: Vascular;  Laterality: Left;  . REVISON OF ARTERIOVENOUS  FISTULA Left 07/02/2015   Procedure: REVISON OF LEFT RADIOCEPHALIC ARTERIOVENOUS FISTULA;  Surgeon: [name], MD;  Location: [place];  Service: Vascular;  Laterality: Left;  . RIGHT/LEFT HEART CATH AND CORONARY/GRAFT ANGIOGRAPHY N/A 05/18/2018   Procedure: RIGHT/LEFT HEART CATH AND CORONARY/GRAFT ANGIOGRAPHY;  Surgeon: Leonie Man, MD;  Location: [place] [facility];  Service: Cardiovascular;  Laterality: N/A;  . UPPER EXTREMITY ANGIOGRAPHY Left 10/18/2018   Procedure: UPPER EXTREMITY ANGIOGRAPHY;  Surgeon: Marty Heck, MD;  Location: [place] [facility];  Service: Cardiovascular;  Laterality: Left;    Medications: I have reviewed the patient's current medications. Allergies:  Allergies  Allergen Reactions  . Doxycycline Nausea And Vomiting    Caused "DEATHLY NAUSEA AND VOMITING"  . Lipitor [Atorvastatin] Other (See Comments)    Stomach pain  . Strawberry Extract Rash    Family History  Adopted: Yes  Family history unknown: Yes   Social History:  reports that she has quit smoking. Her smoking use included cigarettes. She has never used smokeless tobacco. She reports current alcohol use. She reports that she does not use drugs.  Review of Systems  Genitourinary: Positive for dysuria, urgency and vaginal discharge.    Physical Exam:  Vital signs in last 24 hours: Temp:  [97.8 F (36.6 C)-99.9 F (37.7 C)] 98.7 F (37.1 C) (12/17 2300) Pulse Rate:  [97-111] 106 (12/17 2300) Resp:  [18-20] 20 (12/17 2006) BP: (83-108)/(51-55) 108/52 (12/17 2300) SpO2:  [95 %-100 %] 96 % (12/17 2300) Weight:  [49.3 kg] 49.3 kg (12/17 0514) Physical Exam  Constitutional: She appears well-developed and well-nourished.  HENT:  Head: Normocephalic and atraumatic.  Eyes: Pupils are equal, round, and reactive to light. EOM are normal.  Neck: No thyromegaly present.  Cardiovascular: Normal rate and regular rhythm.  Respiratory: Effort normal. No respiratory distress.  GI: Soft. She  exhibits no distension.  Genitourinary:    Vagina normal.     No vaginal discharge.   Musculoskeletal:        General: No edema. Normal range of motion.     Cervical back: Normal range of motion.  Neurological: She is alert.  Skin: Skin is warm and dry.  Psychiatric: She has a normal mood and affect. Her behavior is normal. Judgment and thought content normal.    Laboratory Data:  Results for orders placed or performed during the hospital encounter of 07/27/19 (from the past 72 hour(s))  Glucose, capillary     Status: Abnormal   Collection Time: 07/30/19  6:56 AM  Result Value Ref Range   Glucose-Capillary 121 (H) 70 - 99 mg/dL  Glucose, capillary     Status: Abnormal   Collection Time: 07/30/19 11:24 AM  Result Value Ref Range   Glucose-Capillary 192 (H) 70 - 99 mg/dL  Glucose, capillary     Status: Abnormal   Collection Time: 07/30/19  4:27 PM  Result Value Ref Range   Glucose-Capillary 160 (H) 70 - 99 mg/dL  Glucose, capillary     Status: Abnormal   Collection Time: 07/30/19  9:31 PM  Result Value Ref Range   Glucose-Capillary 248 (H) 70 - 99 mg/dL  Glucose, capillary     Status: Abnormal   Collection Time: 07/31/19  6:29 AM  Result Value Ref Range   Glucose-Capillary 18 (LL) 70 - 99 mg/dL    Comment: QUESTIONABLE RESULTS - CHARGE CREDITED REPEATED TO VERIFY Performed at [facility], [place] [address], [place], [place] [address]   Glucose, capillary     Status: Abnormal   Collection Time: 07/31/19  6:33 AM  Result Value Ref Range   Glucose-Capillary 116 (H) 70 - 99 mg/dL  Glucose, capillary     Status: Abnormal   Collection Time: 07/31/19 12:13 PM  Result Value Ref Range   Glucose-Capillary 364 (H) 70 - 99 mg/dL  Renal function panel     Status: Abnormal   Collection Time: 07/31/19  2:51 PM  Result Value Ref Range   Sodium 130 (L) 135 - 145 mmol/L   Potassium 4.9 3.5 - 5.1 mmol/L   Chloride 91 (L) 98 - 111 mmol/L   CO2 24 22 - 32 mmol/L   Glucose, Bld 383  (H) 70 - 99 mg/dL   BUN 92 (H) 8 - 23 mg/dL   Creatinine, Ser 6.82 (H) 0.44 - 1.00 mg/dL   Calcium 8.7 (L) 8.9 - 10.3 mg/dL   Phosphorus 3.2 2.5 - 4.6 mg/dL   Albumin 2.6 (L) 3.5 - 5.0 g/dL   GFR calc non Af Amer 5 (L) >60 mL/min   GFR calc Af Amer 6 (L) >60 mL/min   Anion gap 15 5 - 15    Comment: Performed at [facility], [place] [address], [place], [place] [address]  CBC     Status: Abnormal   Collection Time: 07/31/19  2:51 PM  Result Value Ref Range   WBC 12.2 (H) 4.0 - 10.5 K/uL   RBC 3.33 (L) 3.87 - 5.11 MIL/uL   Hemoglobin 11.3 (L) 12.0 - 15.0 g/dL   HCT 36.5 36.0 - 46.0 %   MCV 109.6 (H) 80.0 - 100.0 fL   MCH 33.9 26.0 - 34.0 pg   MCHC 31.0 30.0 - 36.0 g/dL   RDW 17.2 (H) 11.5 - 15.5 %   Platelets 155 150 - 400 K/uL   nRBC 0.0 0.0 - 0.2 %    Comment: Performed at [facility], [place] [address], [place], [place] [address]  Glucose, capillary     Status: Abnormal   Collection Time: 07/31/19  6:11 PM  Result Value Ref Range   Glucose-Capillary 105 (H) 70 - 99 mg/dL  Glucose, capillary     Status: Abnormal   Collection Time: 07/31/19  9:17 PM  Result Value Ref Range   Glucose-Capillary 151 (H) 70 - 99 mg/dL  Urinalysis, Routine w reflex microscopic     Status: Abnormal   Collection Time: 07/31/19  9:37 PM  Result Value Ref Range   Color, Urine YELLOW YELLOW   APPearance TURBID (A) CLEAR   Specific Gravity, Urine 1.020 1.005 - 1.030   pH 7.0 5.0 - 8.0   Glucose, UA NEGATIVE NEGATIVE mg/dL   Hgb urine dipstick NEGATIVE NEGATIVE   Bilirubin Urine NEGATIVE NEGATIVE   Ketones, ur 5 (A) NEGATIVE mg/dL   Protein, ur 100 (A) NEGATIVE mg/dL   Nitrite NEGATIVE NEGATIVE  Leukocytes,Ua SMALL (A) NEGATIVE   RBC / HPF 21-50 0 - 5 RBC/hpf   WBC, UA >50 (H) 0 - 5 WBC/hpf   Bacteria, UA MANY (A) NONE SEEN   Mucus PRESENT    Non Squamous Epithelial 0-5 (A) NONE SEEN    Comment: Performed at [facility], [place] [address], [place], [place] [address]  Culture, Urine      Status: Abnormal   Collection Time: 07/31/19 10:05 PM   Specimen: Urine, Clean Catch  Result Value Ref Range   Specimen Description URINE, CLEAN CATCH    Special Requests      Normal Performed at [facility], [place] [address], [place], [place] [address]    Culture MULTIPLE SPECIES PRESENT, SUGGEST RECOLLECTION (A)    Report Status 08/01/2019 FINAL   C difficile quick scan w PCR reflex     Status: None   Collection Time: 07/31/19 10:05 PM   Specimen: Urine, Clean Catch; Stool  Result Value Ref Range   C Diff antigen NEGATIVE NEGATIVE   C Diff toxin NEGATIVE NEGATIVE   C Diff interpretation No C. difficile detected.     Comment: Performed at [facility], [place] [address] [place], [place] [address]  Glucose, capillary     Status: Abnormal   Collection Time: 08/01/19  6:29 AM  Result Value Ref Range   Glucose-Capillary 126 (H) 70 - 99 mg/dL  CBC with Differential     Status: Abnormal   Collection Time: 08/01/19  6:37 AM  Result Value Ref Range   WBC 11.1 (H) 4.0 - 10.5 K/uL   RBC 3.34 (L) 3.87 - 5.11 MIL/uL   Hemoglobin 11.4 (L) 12.0 - 15.0 g/dL   HCT 35.9 (L) 36.0 - 46.0 %   MCV 107.5 (H) 80.0 - 100.0 fL   MCH 34.1 (H) 26.0 - 34.0 pg   MCHC 31.8 30.0 - 36.0 g/dL   RDW 17.2 (H) 11.5 - 15.5 %   Platelets 153 150 - 400 K/uL   nRBC 0.0 0.0 - 0.2 %   Neutrophils Relative % 69 %   Neutro Abs 7.7 1.7 - 7.7 K/uL   Lymphocytes Relative 12 %   Lymphs Abs 1.4 0.7 - 4.0 K/uL   Monocytes Relative 16 %   Monocytes Absolute 1.8 (H) 0.1 - 1.0 K/uL   Eosinophils Relative 1 %   Eosinophils Absolute 0.1 0.0 - 0.5 K/uL   Basophils Relative 1 %   Basophils Absolute 0.1 0.0 - 0.1 K/uL   Immature Granulocytes 1 %   Abs Immature Granulocytes 0.06 0.00 - 0.07 K/uL    Comment: Performed at [facility], [address] [place], [place] [address]  Glucose, capillary     Status: Abnormal   Collection Time: 08/01/19 12:35 PM  Result Value Ref Range   Glucose-Capillary 246 (H)  70 - 99 mg/dL  Glucose, capillary     Status: Abnormal   Collection Time: 08/01/19  4:51 PM  Result Value Ref Range   Glucose-Capillary 171 (H) 70 - 99 mg/dL  Glucose, capillary     Status: Abnormal   Collection Time: 08/01/19  9:13 PM  Result Value Ref Range   Glucose-Capillary 281 (H) 70 - 99 mg/dL   Recent Results (from the past 240 hour(s))  SARS CORONAVIRUS 2 (TAT 6-24 HRS) Nasopharyngeal Nasopharyngeal Swab     Status: None   Collection Time: 07/23/19  2:02 AM   Specimen: Nasopharyngeal Swab  Result Value Ref  Range Status   SARS Coronavirus 2 NEGATIVE NEGATIVE Final    Comment: (NOTE) SARS-CoV-2 target nucleic acids are NOT DETECTED. The SARS-CoV-2 RNA is generally detectable in upper and lower respiratory specimens during the acute phase of infection. Negative results do not preclude SARS-CoV-2 infection, do not rule out co-infections with other pathogens, and should not be used as the sole basis for treatment or other patient management decisions. Negative results must be combined with clinical observations, patient history, and epidemiological information. The expected result is Negative. Fact Sheet for Patients: SugarRoll.be Fact Sheet for Healthcare Providers: https://www.woods-mathews.com/ This test is not yet approved or cleared by the [place] FDA and  has been authorized for detection and/or diagnosis of SARS-CoV-2 by FDA under an Emergency Use Authorization (EUA). This EUA will remain  in effect (meaning this test can be used) for the duration of the COVID-19 declaration under Section 56 4(b)(1) of the Act, 21 U.S.C. section 360bbb-3(b)(1), unless the authorization is terminated or revoked sooner. Performed at [facility], [place] [address], [place], [place] [address]   Culture, blood (routine x 2)     Status: None   Collection Time: 07/26/19  5:29 PM   Specimen: BLOOD LEFT ARM  Result Value Ref Range Status    Specimen Description BLOOD LEFT ARM  Final   Special Requests   Final    BOTTLES DRAWN AEROBIC ONLY Blood Culture adequate volume   Culture   Final    NO GROWTH 5 DAYS Performed at [facility], [place] [address] [place], [place] [address]    Report Status 07/31/2019 FINAL  Final  Culture, blood (routine x 2)     Status: None   Collection Time: 07/26/19  5:29 PM   Specimen: BLOOD RIGHT ARM  Result Value Ref Range Status   Specimen Description BLOOD RIGHT ARM  Final   Special Requests   Final    BOTTLES DRAWN AEROBIC ONLY Blood Culture adequate volume   Culture   Final    NO GROWTH 5 DAYS Performed at [facility], [place] [address] [place], [place] [address]    Report Status 07/31/2019 FINAL  Final  MRSA PCR Screening     Status: None   Collection Time: 07/27/19 10:44 AM   Specimen: Nasopharyngeal  Result Value Ref Range Status   MRSA by PCR NEGATIVE NEGATIVE Final    Comment:        The GeneXpert MRSA Assay (FDA approved for NASAL specimens only), is one component of a comprehensive MRSA colonization surveillance program. It is not intended to diagnose MRSA infection nor to guide or monitor treatment for MRSA infections. Performed at [facility], [place] [address], [place], [place] [address]   Culture, Urine     Status: Abnormal   Collection Time: 07/31/19 10:05 PM   Specimen: Urine, Clean Catch  Result Value Ref Range Status   Specimen Description URINE, CLEAN CATCH  Final   Special Requests   Final    Normal Performed at [facility], [place] [address], [place], [place] [address]    Culture MULTIPLE SPECIES PRESENT, SUGGEST RECOLLECTION (A)  Final   Report Status 08/01/2019 FINAL  Final  C difficile quick scan w PCR reflex     Status: None   Collection Time: 07/31/19 10:05 PM   Specimen: Urine, Clean Catch; Stool  Result Value Ref Range Status   C Diff antigen NEGATIVE NEGATIVE Final   C Diff toxin NEGATIVE  NEGATIVE Final   C Diff  interpretation No C. difficile detected.  Final    Comment: Performed at [facility], [place] [address], [place], [place] [address]   Creatinine: Recent Labs    07/26/19 1158 07/26/19 1427 07/29/19 0726 07/31/19 1451  CREATININE 6.86* 6.68* 8.14* 6.82*   Baseline Creatinine: NA  Impression/Assessment:  78yo with chronic cystitis  Plan:  1. The CT findings and patient exam/history consistent with chronic cystitis and no a colovesical fistula. Please start rocephin 2g daily.  [name] 08/01/2019, 11:25 PM

## 2019-08-01 NOTE — Progress Notes (Signed)
[facility] Progress Note   Subjective:   Patient seen and examined at bedside.  States she is doing ok. Feels really drained on HD days due to PT and HD in the same day.  Provided encouragement.  Objective Vitals:   08/01/19 0304 08/01/19 0456 08/01/19 0514 08/01/19 1338  BP: (!) 92/51 (!) 89/55  (!) 95/51  Pulse: (!) 104 99  97  Resp: 18 20  18   Temp: 98.3 F (36.8 C) 97.8 F (36.6 C)  98.2 F (36.8 C)  TempSrc: Oral   Oral  SpO2: 98% 95%  96%  Weight:   49.3 kg   Height:       Physical Exam General:NAD, chronically ill appearing, elderly female Heart:RRR Lungs:CTAB Abdomen:soft, NTND Extremities:no LE edema Dialysis Access: R IJ [facility]   Filed Weights   07/31/19 1315 07/31/19 1718 08/01/19 0514  Weight: 51.6 kg 50.9 kg 49.3 kg    Intake/Output Summary (Last 24 hours) at 08/01/2019 1539 Last data filed at 08/01/2019 [id number] Gross per 24 hour  Intake 360 ml  Output 870 ml  Net -510 ml    Additional Objective Labs: Basic Metabolic Panel: Recent Labs  Lab 07/26/19 1427 07/29/19 0726 07/31/19 1451  NA 133* 130* 130*  K 4.7 5.1 4.9  CL 94* 95* 91*  CO2 25 18* 24  GLUCOSE 192* 152* 383*  BUN 59* 78* 92*  CREATININE 6.68* 8.14* 6.82*  CALCIUM 9.4 9.1 8.7*  PHOS 2.4* 4.2 3.2   Liver Function Tests: Recent Labs  Lab 07/26/19 1427 07/29/19 0726 07/31/19 1451  ALBUMIN 2.8* 2.8* 2.6*   No results for input(s): LIPASE, AMYLASE in the last 168 hours. CBC: Recent Labs  Lab 07/26/19 1158 07/26/19 1427 07/29/19 0726 07/31/19 1451 08/01/19 0637  WBC 10.4 10.8* 11.2* 12.2* 11.1*  NEUTROABS  --   --   --   --  7.7  HGB 11.7* 11.7* 11.7* 11.3* 11.4*  HCT 36.4 36.5 37.4 36.5 35.9*  MCV 108.3* 109.0* 109.0* 109.6* 107.5*  PLT 112* 120* PLATELET CLUMPS NOTED ON SMEAR, COUNT APPEARS DECREASED 155 153   Blood Culture    Component Value Date/Time   SDES BLOOD LEFT ARM 07/26/2019 1729   SDES BLOOD RIGHT ARM 07/26/2019 1729   SPECREQUEST   07/26/2019 1729    BOTTLES DRAWN AEROBIC ONLY Blood Culture adequate volume   SPECREQUEST  07/26/2019 1729    BOTTLES DRAWN AEROBIC ONLY Blood Culture adequate volume   CULT  07/26/2019 1729    NO GROWTH 5 DAYS Performed at [facility], [place] [address], [place], [place] [address]    CULT  07/26/2019 1729    NO GROWTH 5 DAYS Performed at [place] [address] [place], [place] [address]    REPTSTATUS 07/31/2019 FINAL 07/26/2019 1729   REPTSTATUS 07/31/2019 FINAL 07/26/2019 1729    CBG: Recent Labs  Lab 07/31/19 1213 07/31/19 1811 07/31/19 2117 08/01/19 0629 08/01/19 1235  GLUCAP 364* 105* 151* 126* 246*    Lab Results  Component Value Date   INR 1.0 07/23/2019   INR 1.07 04/18/2018   INR 1.4 10/16/2008   Studies/Results: DG Chest 2 View  Result Date: 07/31/2019 CLINICAL DATA:  Fever EXAM: CHEST - 2 VIEW COMPARISON:  July 23, 2019 FINDINGS: The heart size is stable from prior study. The patient is status post prior median sternotomy. The right-sided tunneled dialysis catheter is well positioned. There is no pneumothorax. No large pleural effusion. No focal infiltrate. IMPRESSION: Stable  appearance of the chest.  No acute cardiopulmonary process. Electronically Signed   By: Constance Holster M.D.   On: 07/31/2019 22:52    Medications: . sodium chloride    . sodium chloride     . ALPRAZolam  1 mg Oral QHS  . aspirin EC  81 mg Oral Daily  . cephALEXin  250 mg Oral Q12H  . Chlorhexidine Gluconate Cloth  6 each Topical Q0600  . clopidogrel  75 mg Oral Daily  . feeding supplement (NEPRO CARB STEADY)  237 mL Oral BID BM  . feeding supplement (PRO-STAT SUGAR FREE 64)  30 mL Oral BID  . folic acid  1 mg Oral Daily  . gabapentin  200 mg Oral QHS  . Gerhardt's butt cream   Topical QID  . insulin aspart  0-9 Units Subcutaneous TID WC  . midodrine  10 mg Oral Q M,W,F-HD  . multivitamin  1 tablet Oral QHS  . rosuvastatin  20 mg Oral q1800  . sevelamer  carbonate  1,600 mg Oral TID WC  . vitamin B-12  500 mcg Oral Daily    Dialysis Orders: MWF at [facility], 300/600, EDW 51.5kg, 2K/2.25Ca, TDC, UFP #4, heparin 1000 - Mircera 200 q 2 weeks Right IJ TDC - failed AVF  Assessment/Plan: 1. Acute CVA: LACAdistribution, presumed embolic.Echowith reduced EF Mod AS ? thrombus vs vet - for TEE planned for tomorrow. PersistentR hemiparesis, speech intact.Statin started. Adm to CIR 12/12 2. ESRD:Continue HD per MWF  Resume tight heparin Dr. Jonnie Finner spoke w/ Dr Posey Pronto , cath placed CK Vasc in September 2020. Regarding echo density attached to HD cath in R atrium, this is not unusual and would not require removal of cath. Next HD tomorrow after TEE.  3. Hypertension/volume:Permissive HTNinitially, now hypotensive - Na low pre HD suggestive of need for some volume removal net UF almost 754mL removed on Wed, post wt 50.9kg.  Titrate down as tolerated. 4. Anemia:Hgb11.4 stable - no ESA needed - prev on high dose  5. Metabolic bone disease:Ca/Phos to goal. Continue home Sevelamer as binder.-un beknownst to her dialysis unit, she tells me she was also taking calcitriol at home -  iPTH in goal - will d/c calcitriol and switch to TIW hectorol - home calcitriol should not be resumed at d/c 6. T2DM 7.  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 Nutrition - high risk for weight loss - intake marginal at present - nepro added 9. Deconditioning/right hemiparesis - CIR adm 10. Dizziness with movement - for vestibular work up  Jen Mow, PA-C [employer] Pager: [phone] 08/01/2019,3:39 PM  LOS: 5 days

## 2019-08-01 NOTE — Progress Notes (Signed)
Physical Therapy Session Note  Patient Details  Name: [name] MRN: [id number] Date of Birth: [birth year]  Today's Date: 08/01/2019 PT Individual Time: 0808-0902 PT Individual Time Calculation (min): 54 min   Short Term Goals: Week 1:  PT Short Term Goal 1 (Week 1): Pt will be able to demonstrate functional dynamic sitting balance wit mod assist PT Short Term Goal 2 (Week 1): Pt will be able to perform slideboard transfer with mod assist +2 PT Short Term Goal 3 (Week 1): Pt will be able to tolerate standing with lift equipment x 5 min for weightbearing/postural control retraining  Skilled Therapeutic Interventions/Progress Updates:    Therapist spoke with Dr. Letta Pate regarding pt's frequent diarrhea last night and low BP and he explained this BP is typical after dialysis; however, due to pt's fatigue and frequent BMs requested pt participate in bed level therapy only. Upon arrival to room pt L semi-sidelying and agreeable to therapy session though reporting overall not feeling well. Upon initiating R LE NMR task of active assisted hip/knee flexion therapist noted pt incontinent of loose stool. Rolling R with max assist, pt able to initiate rolling with cross body reaching with L UE and L hip/knee flexion therapist providing manual facilitation for increased R hemibody engagement in task. Pt required min assist to maintain sidelying position while 2nd person performed total assist LB clothing management and peri-care. Pt noted to have constant discharge and another loose stool as well as significant peri-area skin redness with pressure wound therefore therapist notified MD who was present to assess. Due to this, pt required significantly increased time for peri-care and LB clothing management. Rolling L with total assist and cuing for sequencing to increase pt participation as well as manual facilitation for R hemibody engagement. Patient therapeutically positioned in L sidelying with pillows for  pressure relief and therapist educated nursing staff on importance of rolling schedule and proper positioning - paper reminders of this placed above pt's HOB. Pt left L sidelying in bed with needs in reach and bed alarm on.  Therapy Documentation Precautions:  Precautions Precautions: Fall Precaution Comments: mild Rt inattention; R hemiplegia Restrictions Weight Bearing Restrictions: No  Pain:   Reports overall body soreness - provided repositioning, rest, and emotional support for pain management.    Therapy/Group: Individual Therapy  Tawana Scale, PT, DPT 08/01/2019, 7:52 AM

## 2019-08-01 NOTE — Progress Notes (Signed)
Patient refuses to eat meals but likes nepro.

## 2019-08-01 NOTE — Progress Notes (Signed)
MD aware of low BP this morning. No new orders.

## 2019-08-01 NOTE — Progress Notes (Signed)
Attempted to insert foley cath x3 with Charge RN but unable; noted yellow discharge and blood clots coming out. Dan PA notified.

## 2019-08-01 NOTE — Progress Notes (Signed)
Speech Language Pathology Daily Session Note  Patient Details  Name: [name] MRN: [id number] Date of Birth: 06-22-1941  Today's Date: 08/01/2019 SLP Individual Time: 1400-1500 SLP Individual Time Calculation (min): 60 min  Short Term Goals: Week 1: SLP Short Term Goal 1 (Week 1): Pt will use external aids to recall daily information with min assist verbal cues. SLP Short Term Goal 2 (Week 1): Pt will complete mildly complex tasks with min assist verbal cues for functional problem solving.  Skilled Therapeutic Interventions: Pt was seen for skilled ST targeting cognitive goals. SLP facilitated session with Min-Mod A verbal cues for problem solving and error awareness during a familiar basic money management task (ALFA). Per chart review, her ability to sort count, and make change was improved over last targeted session. SLP further facilitated session with Mod A verbal cues for problem solving and locating cards on the right side of tray during a basic card task (11 up). Min A verbal and visual cues were provided for recall and working memory throughout tasks; visual aids were eventually able to be faded out. Pt exhibited good insight into level of difficulty of these tasks and need for assistance for safety at d/c. 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 Discussion opportunity to talk about coping strategies with neuropsychologist and she stated she was interested - sent CSW secure message regarding request. Pt left laying in bed with alarm set and all needs within reach. Continue per current plan of care.       Pain Pain Assessment Pain Scale: Faces Pain Score: 2  Faces Pain Scale: Hurts a little bit Pain Type: Acute pain Pain Location: (from cathing) Pain Descriptors / Indicators: Discomfort Pain Intervention(s): Distraction;Emotional support Multiple Pain Sites: No  Therapy/Group: Individual  Therapy  Arbutus Leas 08/01/2019, 3:10 PM

## 2019-08-01 NOTE — Plan of Care (Signed)
  Problem: RH BOWEL ELIMINATION Goal: RH STG MANAGE BOWEL WITH ASSISTANCE Description: STG Manage Bowel with mod Assistance. Outcome: Not Progressing; diarrhea   Problem: RH BLADDER ELIMINATION Goal: RH STG MANAGE BLADDER WITH ASSISTANCE Description: STG Manage Bladder With mod Assistance Outcome: Not Progressing; order for foley cath

## 2019-08-01 NOTE — Progress Notes (Signed)
Spoke with son regarding visiting hours. Son upset about one designated visitor per stay. Explained rational to keep patient and other patients and staff safe. Son verbalized understanding.

## 2019-08-01 NOTE — Progress Notes (Signed)
Bladder scan 52cc. Foul odor from discharge noted. Continuous discharge as well. Dan PA notified.

## 2019-08-02 ENCOUNTER — Inpatient Hospital Stay (HOSPITAL_COMMUNITY): Payer: Medicare HMO | Admitting: Certified Registered"

## 2019-08-02 ENCOUNTER — Inpatient Hospital Stay (HOSPITAL_COMMUNITY): Payer: Medicare HMO | Admitting: Occupational Therapy

## 2019-08-02 ENCOUNTER — Inpatient Hospital Stay (HOSPITAL_COMMUNITY): Payer: Medicare HMO | Admitting: Speech Pathology

## 2019-08-02 ENCOUNTER — Encounter (HOSPITAL_COMMUNITY): Payer: Self-pay | Admitting: Physical Medicine & Rehabilitation

## 2019-08-02 ENCOUNTER — Inpatient Hospital Stay (HOSPITAL_COMMUNITY): Payer: Medicare HMO | Admitting: Physical Therapy

## 2019-08-02 ENCOUNTER — Ambulatory Visit (HOSPITAL_COMMUNITY): Admission: RE | Admit: 2019-08-02 | Payer: Medicare HMO | Source: Home / Self Care | Admitting: Cardiovascular Disease

## 2019-08-02 ENCOUNTER — Encounter (HOSPITAL_COMMUNITY)
Admission: RE | Disposition: A | Discharge status: Home Health | Payer: Self-pay | Source: Intra-hospital | Attending: Physical Medicine & Rehabilitation

## 2019-08-02 ENCOUNTER — Inpatient Hospital Stay (HOSPITAL_COMMUNITY)
Admission: RE | Admit: 2019-08-02 | Discharge: 2019-08-02 | Disposition: A | Discharge status: Home / Self Care | Payer: Medicare HMO | Source: Intra-hospital | Attending: Physical Medicine & Rehabilitation | Admitting: Physical Medicine & Rehabilitation

## 2019-08-02 DIAGNOSIS — G459 Transient cerebral ischemic attack, unspecified: Secondary | ICD-10-CM

## 2019-08-02 DIAGNOSIS — I132 Hypertensive heart and chronic kidney disease with heart failure and with stage 5 chronic kidney disease, or end stage renal disease: Secondary | ICD-10-CM | POA: Diagnosis not present

## 2019-08-02 DIAGNOSIS — Z992 Dependence on renal dialysis: Secondary | ICD-10-CM | POA: Diagnosis not present

## 2019-08-02 DIAGNOSIS — I5043 Acute on chronic combined systolic (congestive) and diastolic (congestive) heart failure: Secondary | ICD-10-CM | POA: Diagnosis not present

## 2019-08-02 DIAGNOSIS — I083 Combined rheumatic disorders of mitral, aortic and tricuspid valves: Secondary | ICD-10-CM | POA: Diagnosis not present

## 2019-08-02 DIAGNOSIS — I340 Nonrheumatic mitral (valve) insufficiency: Secondary | ICD-10-CM

## 2019-08-02 DIAGNOSIS — N186 End stage renal disease: Secondary | ICD-10-CM | POA: Diagnosis not present

## 2019-08-02 HISTORY — PX: TEE WITHOUT CARDIOVERSION: SHX5443

## 2019-08-02 HISTORY — PX: BUBBLE STUDY: SHX6837

## 2019-08-02 LAB — RENAL FUNCTION PANEL
Albumin: 2.2 g/dL — ABNORMAL LOW (ref 3.5–5.0)
Anion gap: 15 (ref 5–15)
BUN: 86 mg/dL — ABNORMAL HIGH (ref 8–23)
CO2: 24 mmol/L (ref 22–32)
Calcium: 8.3 mg/dL — ABNORMAL LOW (ref 8.9–10.3)
Chloride: 89 mmol/L — ABNORMAL LOW (ref 98–111)
Creatinine, Ser: 5.88 mg/dL — ABNORMAL HIGH (ref 0.44–1.00)
GFR calc Af Amer: 7 mL/min — ABNORMAL LOW (ref >60)
GFR calc non Af Amer: 6 mL/min — ABNORMAL LOW (ref >60)
Glucose, Bld: 273 mg/dL — ABNORMAL HIGH (ref 70–99)
Phosphorus: 3.1 mg/dL (ref 2.5–4.6)
Potassium: 3.9 mmol/L (ref 3.5–5.1)
Sodium: 128 mmol/L — ABNORMAL LOW (ref 135–145)

## 2019-08-02 LAB — CBC
HCT: 31 % — ABNORMAL LOW (ref 36.0–46.0)
Hemoglobin: 9.8 g/dL — ABNORMAL LOW (ref 12.0–15.0)
MCH: 34 pg (ref 26.0–34.0)
MCHC: 31.6 g/dL (ref 30.0–36.0)
MCV: 107.6 fL — ABNORMAL HIGH (ref 80.0–100.0)
Platelets: 148 10*3/uL — ABNORMAL LOW (ref 150–400)
RBC: 2.88 MIL/uL — ABNORMAL LOW (ref 3.87–5.11)
RDW: 17.1 % — ABNORMAL HIGH (ref 11.5–15.5)
WBC: 9.4 10*3/uL (ref 4.0–10.5)
nRBC: 0 % (ref 0.0–0.2)

## 2019-08-02 LAB — GLUCOSE, CAPILLARY
Glucose-Capillary: 139 mg/dL — ABNORMAL HIGH (ref 70–99)
Glucose-Capillary: 156 mg/dL — ABNORMAL HIGH (ref 70–99)
Glucose-Capillary: 85 mg/dL (ref 70–99)
Glucose-Capillary: 105 mg/dL — ABNORMAL HIGH (ref 70–99)

## 2019-08-02 SURGERY — ECHOCARDIOGRAM, TRANSESOPHAGEAL
Anesthesia: Monitor Anesthesia Care

## 2019-08-02 MED ORDER — SODIUM CHLORIDE 0.9 % IV SOLN: 100.0000 mL | INTRAVENOUS | Status: DC | PRN

## 2019-08-02 MED ORDER — ALTEPLASE 2 MG IJ SOLR: 2.0000 mg | Freq: Once | INTRAMUSCULAR | Status: DC | PRN

## 2019-08-02 MED ORDER — HEPARIN SODIUM (PORCINE) 1000 UNIT/ML DIALYSIS: 1000.0000 [IU] | INTRAMUSCULAR | Status: DC | PRN

## 2019-08-02 MED ORDER — BUTAMBEN-TETRACAINE-BENZOCAINE 2-2-14 % EX AERO
INHALATION_SPRAY | CUTANEOUS | Status: DC | PRN
  Administered 2019-08-02: 2 via TOPICAL

## 2019-08-02 MED ORDER — LIDOCAINE HCL (CARDIAC) PF 100 MG/5ML IV SOSY
PREFILLED_SYRINGE | INTRAVENOUS | Status: DC | PRN
  Administered 2019-08-02: 20 mg via INTRAVENOUS

## 2019-08-02 MED ORDER — PENTAFLUOROPROP-TETRAFLUOROETH EX AERO: 1.0000 "application " | INHALATION_SPRAY | CUTANEOUS | Status: DC | PRN

## 2019-08-02 MED ORDER — SODIUM CHLORIDE 0.9 % IV SOLN
2.0000 g | Freq: Every day | INTRAVENOUS | Status: DC
  Administered 2019-08-02 – 2019-08-04 (×3): 2 g via INTRAVENOUS
  Filled 2019-08-02 (×2): qty 20
  Filled 2019-08-02: qty 2
  Filled 2019-08-02: qty 20

## 2019-08-02 MED ORDER — SODIUM CHLORIDE 0.9 % IV SOLN: INTRAVENOUS | Status: DC

## 2019-08-02 MED ORDER — LIDOCAINE-PRILOCAINE 2.5-2.5 % EX CREA: 1.0000 "application " | TOPICAL_CREAM | CUTANEOUS | Status: DC | PRN

## 2019-08-02 MED ORDER — PHENYLEPHRINE HCL (PRESSORS) 10 MG/ML IV SOLN
INTRAVENOUS | Status: DC | PRN
  Administered 2019-08-02: 80 ug via INTRAVENOUS
  Administered 2019-08-02 (×2): 40 ug via INTRAVENOUS

## 2019-08-02 MED ORDER — MIDODRINE HCL 5 MG PO TABS
ORAL_TABLET | ORAL | Status: AC
  Administered 2019-08-02: 10 mg via ORAL
  Filled 2019-08-02: qty 2

## 2019-08-02 MED ORDER — PROPOFOL 10 MG/ML IV BOLUS
INTRAVENOUS | Status: DC | PRN
  Administered 2019-08-02 (×4): 10 mg via INTRAVENOUS

## 2019-08-02 MED ORDER — LIDOCAINE HCL (PF) 1 % IJ SOLN: 5.0000 mL | INTRAMUSCULAR | Status: DC | PRN

## 2019-08-02 MED ORDER — HEPARIN SODIUM (PORCINE) 1000 UNIT/ML IJ SOLN
INTRAMUSCULAR | Status: AC
  Administered 2019-08-02: 3800 [IU] via INTRAVENOUS_CENTRAL
  Filled 2019-08-02: qty 4

## 2019-08-02 NOTE — Progress Notes (Signed)
Occupational Therapy Note  Patient Details  Name: [name] MRN: [id number] Date of Birth: Jan 06, 1941  Pt missed 75 mins of skilled OT secondary to being gone for TEE procedure.    Karion Cudd OTR/L 08/02/2019, 5:23 PM

## 2019-08-02 NOTE — Transfer of Care (Signed)
Immediate Anesthesia Transfer of Care Note  Patient: [name]  Procedure(s) Performed: TRANSESOPHAGEAL ECHOCARDIOGRAM (TEE) (N/A ) BUBBLE STUDY  Patient Location: Endoscopy Unit  Anesthesia Type:MAC  Level of Consciousness: drowsy, patient cooperative, confused, lethargic and responds to stimulation  Airway & Oxygen Therapy: Patient Spontanous Breathing and Patient connected to nasal cannula oxygen  Post-op Assessment: Report given to RN and Post -op Vital signs reviewed and stable  Post vital signs: Reviewed and stable  Last Vitals:  Vitals Value Taken Time  BP 78/36 08/02/19 0939  Temp    Pulse 104 08/02/19 0939  Resp 22 08/02/19 0939  SpO2 95 % 08/02/19 0939  Vitals shown include unvalidated device data.  Last Pain:  Vitals:   08/02/19 0815  TempSrc: Temporal  PainSc: 0-No pain         Complications: No apparent anesthesia complications

## 2019-08-02 NOTE — Anesthesia Procedure Notes (Signed)
Procedure Name: MAC Date/Time: 08/02/2019 9:07 AM Performed by: Mariea Clonts, CRNA Pre-anesthesia Checklist: Patient identified, Emergency Drugs available, Suction available, Patient being monitored and Timeout performed Patient Re-evaluated:Patient Re-evaluated prior to induction Oxygen Delivery Method: Simple face mask and Nasal cannula

## 2019-08-02 NOTE — Progress Notes (Signed)
Social Work Patient ID: [name], female   DOB: 10-10-1940, 78 y.o.   MRN: [id number]  Met with husband who was here in the room and asked Dan-PA to talk with him regarding test pt had this am. Husband has been here to see pt in therapies and to provide support to her. Have tried to contact son and no answer. Asked MD pt call him also to go over medical issues and he reports no answer either. Will continue to try to reach to answer questions and address concerns.

## 2019-08-02 NOTE — Progress Notes (Signed)
  Echocardiogram Echocardiogram Transesophageal has been performed.  [name] [name] 08/02/2019, 9:43 AM

## 2019-08-02 NOTE — CV Procedure (Signed)
Brief TEE Note  LVEF 35%.  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  For additional details see full report.  Jeanne Berte C. Oval Linsey, MD, [facility] 08/02/2019 9:36 AM

## 2019-08-02 NOTE — Progress Notes (Signed)
Speech Language Pathology Daily Session Note  Patient Details  Name: TEIARA JOHN MRN: [id number] Date of Birth: 1940/10/01  Today's Date: 08/02/2019 SLP Individual Time: 0715-0800 SLP Individual Time Calculation (min): 45 min  Short Term Goals: Week 1: SLP Short Term Goal 1 (Week 1): Pt will use external aids to recall daily information with min assist verbal cues. SLP Short Term Goal 2 (Week 1): Pt will complete mildly complex tasks with min assist verbal cues for functional problem solving.  Skilled Therapeutic Interventions: Skilled treatment session targeted focused attention and comprehension of information. SLP received pt in bed and pt able to arouse to name. Pt currently NPO for procedure but unable to state what procedure was for. Education provided with pt able to recall and tell her husband when he called on room phone. Pt's mouth was very dry and voice greatly reduced likely d/t NPO status. SLP applied moisturizer to mouth as pt was too fatigued. SLP also held phone as pt was too fatigued to hold it in place. Pt left in bed resting comfortably.        Pain Pain Assessment Pain Scale: 0-10 Pain Score: 0-No pain  Therapy/Group: Individual Therapy  Martha Ellerby 08/02/2019, 8:42 AM

## 2019-08-02 NOTE — Anesthesia Postprocedure Evaluation (Signed)
Anesthesia Post Note  Patient: [name]  Procedure(s) Performed: TRANSESOPHAGEAL ECHOCARDIOGRAM (TEE) (N/A ) BUBBLE STUDY     Patient location during evaluation: Endoscopy Anesthesia Type: MAC Level of consciousness: awake and alert Pain management: pain level controlled Vital Signs Assessment: post-procedure vital signs reviewed and stable Respiratory status: spontaneous breathing, nonlabored ventilation, respiratory function stable and patient connected to nasal cannula oxygen Cardiovascular status: stable and blood pressure returned to baseline Postop Assessment: no apparent nausea or vomiting Anesthetic complications: no    Last Vitals:  Vitals:   08/02/19 1000 08/02/19 1010  BP: (!) 94/37 (!) 84/35  Pulse: (!) 102 (!) 102  Resp: (!) 22 16  Temp:    SpO2: 99% 93%    Last Pain:  Vitals:   08/02/19 0943  TempSrc: Oral  PainSc: 0-No pain                 Catalina Gravel

## 2019-08-02 NOTE — Progress Notes (Signed)
Physical Therapy Session Note  Patient Details  Name: [name] MRN: [id number] Date of Birth: 1940/10/01  Today's Date: 08/02/2019 PT Individual Time: 1124-1202 PT Individual Time Calculation (min): 38 min   Short Term Goals: Week 1:  PT Short Term Goal 1 (Week 1): Pt will be able to demonstrate functional dynamic sitting balance wit mod assist PT Short Term Goal 2 (Week 1): Pt will be able to perform slideboard transfer with mod assist +2 PT Short Term Goal 3 (Week 1): Pt will be able to tolerate standing with lift equipment x 5 min for weightbearing/postural control retraining  Skilled Therapeutic Interventions/Progress Updates:    Pt received supine in bed with her husband present and pt agreeable to therapy session stating "I want to do this because I want to get better." Pt's husband leaving to go home. Performed supine R LE PROM/active assisted hip and knee flexion/extension, ankle DF stretch 2x30 seconds, and hip abduction/adduction as well as R UE elbow flexion/extension - pt reports this allows increased comfort in the bed. Performed supine L LE hip/knee flexion/extension exercise and bridging with therapist providing manual assist for R LE positioning with facilitation for increased weight bearing x10 reps each and pt unable to lift hips from bed this session. Pt agreeable to sit EOB. Performed supine>sit via logroll technique with total assist for R hemibody management, B LE management, and trunk upright - max cuing for sequencing. Pt demonstrated some improvement in static trunk control varying from min assist to max/total assist for static sitting balance with and without UE support; however, continues to demonstrate significant thoracic kyphosis with cervical flexion causing downward gaze - cuing and manual facilitation for increased anterior pelvic tilt and upright trunk posture. Pt also continues to demonstrate intermittent pushing tendencies when using L UE support. Performed  dynamic sitting balance and R UE weight bearing task of R/L lateral trunk leans onto forearm support with max/total assist for trunk control and total assist for R UE positioning. Pt tolerated sitting EOB for ~85minutes prior to requesting to return to supine due to fatigue. Sit>supine with total assist for B LE management and trunk descent. Therapist reinforced prior education regarding importance of sidelying for pressure relief and rolling schedule. Pt therapeutically positioned in L sidelying with pillows for pressure relief and pt immediately falling asleep - left with needs in reach and bed alarm on. Therapist notified nursing staff of pt's position and to continue with rolling schedule.   Therapy Documentation Precautions:  Precautions Precautions: Fall Precaution Comments: mild Rt inattention; R hemiplegia Restrictions Weight Bearing Restrictions: No  Pain: Reports some back discomfort during sitting EOB tasks but with repositioning found relief.    Therapy/Group: Individual Therapy  Tawana Scale, PT, DPT 08/02/2019, 8:02 AM

## 2019-08-02 NOTE — Anesthesia Preprocedure Evaluation (Signed)
Anesthesia Evaluation  Patient identified by MRN, date of birth, ID band Patient awake    Reviewed: Allergy & Precautions, NPO status , Patient's Chart, lab work & pertinent test results  History of Anesthesia Complications (+) PONV and history of anesthetic complications  Airway Mallampati: II  TM Distance: >3 FB Neck ROM: Full    Dental  (+) Teeth Intact, Dental Advisory Given   Pulmonary neg pulmonary ROS, former smoker,    Pulmonary exam normal breath sounds clear to auscultation       Cardiovascular hypertension, + CAD, + Past MI, + CABG, + Peripheral Vascular Disease and +CHF  Normal cardiovascular exam+ dysrhythmias Atrial Fibrillation + Valvular Problems/Murmurs AS  Rhythm:Regular Rate:Normal     Neuro/Psych PSYCHIATRIC DISORDERS Anxiety TIACVA    GI/Hepatic Neg liver ROS, GERD  ,  Endo/Other  negative endocrine ROSdiabetes  Renal/GU Renal InsufficiencyRenal disease     Musculoskeletal  (+) Arthritis ,   Abdominal   Peds  Hematology  (+) Blood dyscrasia, anemia ,   Anesthesia Other Findings Day of surgery medications reviewed with the patient.  Reproductive/Obstetrics                            Anesthesia Physical Anesthesia Plan  ASA: III  Anesthesia Plan: MAC   Post-op Pain Management:    Induction: Intravenous  PONV Risk Score and Plan: 3 and Propofol infusion and Treatment may vary due to age or medical condition  Airway Management Planned: Natural Airway and Nasal Cannula  Additional Equipment:   Intra-op Plan:   Post-operative Plan:   Informed Consent: I have reviewed the patients History and Physical, chart, labs and discussed the procedure including the risks, benefits and alternatives for the proposed anesthesia with the patient or authorized representative who has indicated his/her understanding and acceptance.     Dental advisory given  Plan Discussed  with: CRNA  Anesthesia Plan Comments:         Anesthesia Quick Evaluation

## 2019-08-02 NOTE — Progress Notes (Addendum)
[name] Progress Note   Subjective:   Patient seen and examined at bedside in dialysis.  Reports feeling tired post procedure.  Tolerated lunch.  BP low at onset of dialysis. Did not get midodrine pre HD, giving now.  Plan to run even today.    Objective Vitals:   08/02/19 0950 08/02/19 1000 08/02/19 1010 08/02/19 1315  BP: (!) 83/38 (!) 94/37 (!) 84/35   Pulse: 100 (!) 102 (!) 102 78  Resp: 19 (!) 22 16   Temp:      TempSrc:      SpO2: 99% 99% 93%   Weight:      Height:       Physical Exam General:NAD, chronically ill appearing, elderly female Heart:RRR Lungs: CTAB anterolaterally  Extremities:no LE edema Dialysis Access: R IJ TDC accessed  Filed Weights   07/31/19 1718 08/01/19 0514 08/02/19 0427  Weight: 50.9 kg 49.3 kg 50.2 kg    Intake/Output Summary (Last 24 hours) at 08/02/2019 1515 Last data filed at 08/02/2019 1326 Gross per 24 hour  Intake 477 ml  Output --  Net 477 ml    Additional Objective Labs: Basic Metabolic Panel: Recent Labs  Lab 07/29/19 0726 07/31/19 1451  NA 130* 130*  K 5.1 4.9  CL 95* 91*  CO2 18* 24  GLUCOSE 152* 383*  BUN 78* 92*  CREATININE 8.14* 6.82*  CALCIUM 9.1 8.7*  PHOS 4.2 3.2   Liver Function Tests: Recent Labs  Lab 07/29/19 0726 07/31/19 1451  ALBUMIN 2.8* 2.6*   CBC: Recent Labs  Lab 07/29/19 0726 07/31/19 1451 08/01/19 0637  WBC 11.2* 12.2* 11.1*  NEUTROABS  --   --  7.7  HGB 11.7* 11.3* 11.4*  HCT 37.4 36.5 35.9*  MCV 109.0* 109.6* 107.5*  PLT PLATELET CLUMPS NOTED ON SMEAR, COUNT APPEARS DECREASED 155 153   Blood Culture    Component Value Date/Time   SDES URINE, CLEAN CATCH 07/31/2019 2205   SPECREQUEST  07/31/2019 2205    Normal Performed at [facility], [place] [address], [place], [place] [address]    CULT MULTIPLE SPECIES PRESENT, SUGGEST RECOLLECTION (A) 07/31/2019 2205   REPTSTATUS 08/01/2019 FINAL 07/31/2019 2205    CBG: Recent Labs  Lab 08/01/19 1235  08/01/19 1651 08/01/19 2113 08/02/19 0633 08/02/19 1140  GLUCAP 246* 171* 281* 139* 156*    Lab Results  Component Value Date   INR 1.0 07/23/2019   INR 1.07 04/18/2018   INR 1.4 10/16/2008   Studies/Results: DG Chest 2 View  Result Date: 07/31/2019 CLINICAL DATA:  Fever EXAM: CHEST - 2 VIEW COMPARISON:  July 23, 2019 FINDINGS: The heart size is stable from prior study. The patient is status post prior median sternotomy. The right-sided tunneled dialysis catheter is well positioned. There is no pneumothorax. No large pleural effusion. No focal infiltrate. IMPRESSION: Stable appearance of the chest.  No acute cardiopulmonary process. Electronically Signed   By: Constance Holster M.D.   On: 07/31/2019 22:52   CT RENAL STONE STUDY  Result Date: 08/01/2019 CLINICAL DATA:  Pyelonephritis. EXAM: CT ABDOMEN AND PELVIS WITHOUT CONTRAST TECHNIQUE: Multidetector CT imaging of the abdomen and pelvis was performed following the standard protocol without IV contrast. COMPARISON:  None. FINDINGS: Lower chest: No acute abnormality. Hepatobiliary: Normal liver. Small dependent stones in the gallbladder fundus. No wall thickening or inflammation. No bile duct dilation. Pancreas: Unremarkable. No pancreatic ductal dilatation or surrounding inflammatory changes. Spleen: Mild enlargement of the spleen, measuring 15 x 5 x  10 cm. No splenic mass or focal lesion. Adrenals/Urinary Tract: No adrenal masses. Bilateral renal cortical thinning/atrophy. No renal masses. No hydronephrosis. Ureters are normal in course and in caliber. Bladder wall is mildly thickened, with the serosal margin somewhat ill-defined with the adjacent fat shows hazy opacity. Tiny amount of nondependent air in the bladder. No mass or stone. Stomach/Bowel: Stomach is unremarkable. Small bowel is normal in caliber. No wall thickening. No inflammation. Mild to moderate generalized increased stool burden in the colon. No colonic wall thickening  or inflammation. Normal appendix visualized. Rectal tube distends the rectum. Vascular/Lymphatic: Dense aortic atherosclerotic calcifications extending to the branch vessels. No enlarged lymph nodes. Reproductive: Apparent thickening of the endometrium versus endometrial fluid distending the endometrial canal. This widens the endometrium/canal to 27 mm. No adnexal masses. Other: No hernia or ascites. Musculoskeletal: No fracture or acute finding.  No bone lesion. IMPRESSION: 1. Bladder wall is thickened with adjacent inflammatory changes, findings consistent with cystitis. 2. Diffuse renal cortical thinning with decreased renal sizes. No hydronephrosis. No unenhanced CT evidence of pyelonephritis. 3. Thickened endometrium versus distended endometrial canal. Recommend follow-up pelvic ultrasound for further assessment. 4. Mild to moderate increased colonic stool burden. No bowel obstruction or inflammation. 5. Mild splenomegaly of unclear etiology. 6. Extensive aortic atherosclerotic calcifications. Electronically Signed   By: Lajean Manes M.D.   On: 08/01/2019 20:03   ECHO TEE  Result Date: 08/02/2019   TRANSESOPHOGEAL ECHO REPORT   Patient Name:   [name] Date of Exam: 08/02/2019 Medical Rec #:  [id number]     Height:       68.0 in Accession #:    [id number]    Weight:       110.7 lb Date of Birth:  Sep 19, 1940     BSA:          1.59 m Patient Age:    78 years      BP:           80/33 mmHg Patient Gender: F             HR:           99 bpm. Exam Location:  Inpatient  Procedure: 2D Echo and Transesophageal Echo                                 MODIFIED REPORT  This report was modified by Skeet Latch MD on 08/02/2019 due to IVC not                                      seen. Indications:     TIA 435.9/G45.9  History:         Patient has prior history of Echocardiogram examinations, most                  recent 07/23/2019. ESRD.  Sonographer:     Vikki Ports [place] Referring Phys:  [place]  Diagnosing Phys: Skeet Latch MD  PROCEDURE: Consent was requested emergently by emergency room physicain. The transesophogeal probe was passed through the esophogus of the patient. The patient's vital signs; including heart rate, blood pressure, and oxygen saturation; remained stable throughout the procedure. The patient developed no complications during the procedure. IMPRESSIONS  1. Left ventricular ejection fraction, by visual estimation, is 25 to 30%. The left ventricle has moderate  to severely decreased function. There is no left ventricular hypertrophy.  2. The left ventricle demonstrates global hypokinesis.  3. Global right ventricle has normal systolic function.The right ventricular size is normal. No increase in right ventricular wall thickness.  4. Left atrial size was normal.  5. Right atrial size was normal.  6. The mitral valve is normal in structure. Mild mitral valve regurgitation. No evidence of mitral stenosis.  7. The tricuspid valve is normal in structure. Tricuspid valve regurgitation is mild.  8. The aortic valve is abnormal. Aortic valve regurgitation is not visualized. No evidence of aortic valve sclerosis or stenosis.  9. Aortic valve area 2.0 cm^2 by planimetry. The left and non-coronary cusps are restricted. Functionally bicuspid. 10. The pulmonic valve was normal in structure. Pulmonic valve regurgitation is trivial. 11. Mild plaque invoving the descending aorta. FINDINGS  Left Ventricle: Left ventricular ejection fraction, by visual estimation, is 25 to 30%. The left ventricle has moderate to severely decreased function. The left ventricle demonstrates global hypokinesis. There is no left ventricular hypertrophy. Normal left atrial pressure. Right Ventricle: The right ventricular size is normal. No increase in right ventricular wall thickness. Global RV systolic function is has normal systolic function. Left Atrium: Left atrial size was normal in size. Right Atrium: Right atrial size  was normal in size Pericardium: There is no evidence of pericardial effusion. Mitral Valve: The mitral valve is normal in structure. No evidence of mitral valve stenosis by observation. Mild mitral valve regurgitation. Tricuspid Valve: The tricuspid valve is normal in structure. Tricuspid valve regurgitation is mild. Aortic Valve: The aortic valve is abnormal. Aortic valve regurgitation is not visualized. The aortic valve is structurally normal, with no evidence of sclerosis or stenosis. Aortic valve mean gradient measures 6.0 mmHg. Aortic valve peak gradient measures 9.2 mmHg. Aortic valve area, by VTI measures 1.04 cm. Aortic valve area 2.0 cm^2 by planimetry. The left and non-coronary cusps are restricted. Functionally bicuspid. Pulmonic Valve: The pulmonic valve was normal in structure. Pulmonic valve regurgitation is trivial. Aorta: The aortic root, ascending aorta and aortic arch are all structurally normal, with no evidence of dilitation or obstruction. There is mild, atheroma plaque involving the descending aorta. Shunts: Agitated saline contrast was given intravenously to evaluate for intracardiac shunting. Saline contrast bubble study was negative, with no evidence of any interatrial shunt. There is no evidence of a patent foramen ovale. No ventricular septal defect is seen or detected. There is no evidence of an atrial septal defect. No atrial level shunt detected by color flow Doppler.  LEFT VENTRICLE          Normals PLAX 2D LVOT diam:     1.90 cm  2.0 cm LVOT Area:     2.84 cm 3.14 cm2  AORTIC VALVE                    Normals AV Area (Vmax):    1.38 cm AV Area (Vmean):   1.11 cm     3.06 cm2 AV Area (VTI):     1.04 cm AV Vmax:           152.00 cm/s AV Vmean:          112.000 cm/s 77 cm/s AV VTI:            0.263 m      3.15 cm2 AV Peak Grad:      9.2 mmHg AV Mean Grad:      6.0 mmHg  3 mmHg LVOT Vmax:         74.20 cm/s LVOT Vmean:        43.900 cm/s  75 cm/s LVOT VTI:          0.096 m       25.3 cm LVOT/AV VTI ratio: 0.37         1 TRICUSPID VALVE             Normals TR Peak grad:   22.3 mmHg TR Vmax:        236.00 cm/s 288 cm/s  SHUNTS Systemic VTI:  0.10 m Systemic Diam: 1.90 cm  Skeet Latch MD Electronically signed by Skeet Latch MD Signature Date/Time: 08/02/2019/11:39:18 AM    Final (Updated)     Medications: . sodium chloride    . sodium chloride    . cefTRIAXone (ROCEPHIN)  IV 2 g (08/02/19 0651)   . midodrine      . ALPRAZolam  1 mg Oral QHS  . aspirin EC  81 mg Oral Daily  . Chlorhexidine Gluconate Cloth  6 each Topical Q0600  . Chlorhexidine Gluconate Cloth  6 each Topical Q0600  . clopidogrel  75 mg Oral Daily  . feeding supplement (NEPRO CARB STEADY)  237 mL Oral BID BM  . feeding supplement (PRO-STAT SUGAR FREE 64)  30 mL Oral BID  . folic acid  1 mg Oral Daily  . gabapentin  200 mg Oral QHS  . Gerhardt's butt cream   Topical QID  . insulin aspart  0-9 Units Subcutaneous TID WC  . midodrine  10 mg Oral Q M,W,F-HD  . multivitamin  1 tablet Oral QHS  . rosuvastatin  20 mg Oral q1800  . sevelamer carbonate  1,600 mg Oral TID WC  . vitamin B-12  500 mcg Oral Daily    Dialysis Orders: MWF at [facility] 4hr, 300/600, EDW 51.5kg, 2K/2.25Ca, TDC, UFP #4, heparin 1000 - Mircera 200 q 2 weeks Right IJ TDC - failed AVF  Assessment/Plan: 1. 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 TEE today, no thrombus/mass, mild MR & TR, bicuspid valve functional w/partial fusion of cusps, LVEF 35% w/gobal hypokinesis. PersistentR hemiparesis, speech intact.Statin started. Adm to CIR 12/12 2. ESRD:Continue HD per MWF. Resume tight heparin Dr. Jonnie Finner spoke w/ Dr Posey Pronto , cath placed CK Vasc in September 2020. HD today per regular schedule, running even due to low BP. 3. Hypertension/volume:Permissive HTNinitially, now hypotensive - does not appear to have excess volume.  Midodrine with HD.  Running even today due to hypotension.  4. Anemia:Hgb11.4>9.8, stable -  no ESA needed, if Hgb remains <10, will start ESA. 5. Metabolic bone disease:Ca/Phos to goal. Continue home Sevelamer as binder.-un beknownst to her dialysis unit, she tells me she was also taking calcitriol at home -  iPTH in goal - will d/c calcitriol and switch to TIW hectorol - home calcitriol should not be resumed at d/c 6. T2DM 7.  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 Nutrition - high risk for weight loss - intake marginal at present - nepro added 9. Deconditioning/right hemiparesis - CIR adm 10. Dizziness with movement - for vestibular work up  [employer], PA-C [employer] Pager: [phone] 08/02/2019,3:15 PM  LOS: 6 days   I have seen and examined this patient and agree with plan and assessment in the above note with renal recommendations/intervention highlighted.  Seen on HD and tolerating it well.  [place] John A Benji Poynter,MD 08/02/2019 4:22 PM

## 2019-08-03 ENCOUNTER — Inpatient Hospital Stay (HOSPITAL_COMMUNITY): Payer: Medicare HMO | Admitting: Rehabilitation

## 2019-08-03 ENCOUNTER — Inpatient Hospital Stay (HOSPITAL_COMMUNITY): Payer: Medicare HMO | Admitting: Occupational Therapy

## 2019-08-03 DIAGNOSIS — I5043 Acute on chronic combined systolic (congestive) and diastolic (congestive) heart failure: Secondary | ICD-10-CM

## 2019-08-03 DIAGNOSIS — R7309 Other abnormal glucose: Secondary | ICD-10-CM

## 2019-08-03 DIAGNOSIS — R197 Diarrhea, unspecified: Secondary | ICD-10-CM

## 2019-08-03 DIAGNOSIS — E1165 Type 2 diabetes mellitus with hyperglycemia: Secondary | ICD-10-CM

## 2019-08-03 DIAGNOSIS — N3020 Other chronic cystitis without hematuria: Secondary | ICD-10-CM

## 2019-08-03 DIAGNOSIS — K909 Intestinal malabsorption, unspecified: Secondary | ICD-10-CM

## 2019-08-03 LAB — GLUCOSE, CAPILLARY
Glucose-Capillary: 146 mg/dL — ABNORMAL HIGH (ref 70–99)
Glucose-Capillary: 219 mg/dL — ABNORMAL HIGH (ref 70–99)
Glucose-Capillary: 228 mg/dL — ABNORMAL HIGH (ref 70–99)
Glucose-Capillary: 404 mg/dL — ABNORMAL HIGH (ref 70–99)
Glucose-Capillary: 372 mg/dL — ABNORMAL HIGH (ref 70–99)
Glucose-Capillary: 293 mg/dL — ABNORMAL HIGH (ref 70–99)

## 2019-08-03 NOTE — Progress Notes (Addendum)
In room when tech took pt CBG of 404. Dr. Posey Pronto called. Per Dr. Posey Pronto telephone readback give additional 3 units of novolog now. Recheck CBG in 30 minutes. If CBG above 250 for recheck give additional 2units novolog. Will implement orders.  timeline 21:28 CBG 404 21:45 3 units given 22:25 CBG 372 22:35 2 units given 23:03 CBG 293 Charge RN notified, no further interventions

## 2019-08-03 NOTE — Progress Notes (Addendum)
[place] PHYSICAL MEDICINE & REHABILITATION PROGRESS NOTE   Subjective/Complaints: Patient seen laying in bed this morning.  She states she slept well overnight.  She has to be repositioned.  She had TEE yesterday, notes reviewed.  She was also seen by nephrology yesterday, notes reviewed.  ROS: Denies CP, SOB, N/V.  Objective:   CT RENAL STONE STUDY  Result Date: 08/01/2019 CLINICAL DATA:  Pyelonephritis. EXAM: CT ABDOMEN AND PELVIS WITHOUT CONTRAST TECHNIQUE: Multidetector CT imaging of the abdomen and pelvis was performed following the standard protocol without IV contrast. COMPARISON:  None. FINDINGS: Lower chest: No acute abnormality. Hepatobiliary: Normal liver. Small dependent stones in the gallbladder fundus. No wall thickening or inflammation. No bile duct dilation. Pancreas: Unremarkable. No pancreatic ductal dilatation or surrounding inflammatory changes. Spleen: Mild enlargement of the spleen, measuring 15 x 5 x 10 cm. No splenic mass or focal lesion. Adrenals/Urinary Tract: No adrenal masses. Bilateral renal cortical thinning/atrophy. No renal masses. No hydronephrosis. Ureters are normal in course and in caliber. Bladder wall is mildly thickened, with the serosal margin somewhat ill-defined with the adjacent fat shows hazy opacity. Tiny amount of nondependent air in the bladder. No mass or stone. Stomach/Bowel: Stomach is unremarkable. Small bowel is normal in caliber. No wall thickening. No inflammation. Mild to moderate generalized increased stool burden in the colon. No colonic wall thickening or inflammation. Normal appendix visualized. Rectal tube distends the rectum. Vascular/Lymphatic: Dense aortic atherosclerotic calcifications extending to the branch vessels. No enlarged lymph nodes. Reproductive: Apparent thickening of the endometrium versus endometrial fluid distending the endometrial canal. This widens the endometrium/canal to 27 mm. No adnexal masses. Other: No hernia or  ascites. Musculoskeletal: No fracture or acute finding.  No bone lesion. IMPRESSION: 1. Bladder wall is thickened with adjacent inflammatory changes, findings consistent with cystitis. 2. Diffuse renal cortical thinning with decreased renal sizes. No hydronephrosis. No unenhanced CT evidence of pyelonephritis. 3. Thickened endometrium versus distended endometrial canal. Recommend follow-up pelvic ultrasound for further assessment. 4. Mild to moderate increased colonic stool burden. No bowel obstruction or inflammation. 5. Mild splenomegaly of unclear etiology. 6. Extensive aortic atherosclerotic calcifications. Electronically Signed   By: Lajean Manes M.D.   On: 08/01/2019 20:03   ECHO TEE  Result Date: 08/02/2019   TRANSESOPHOGEAL ECHO REPORT   Patient Name:   [name] Date of Exam: 08/02/2019 Medical Rec #:  [id number]     Height:       68.0 in Accession #:    [id number]    Weight:       110.7 lb Date of Birth:  02-Sep-1940     BSA:          1.59 m Patient Age:    78 years      BP:           80/33 mmHg Patient Gender: F             HR:           99 bpm. Exam Location:  Inpatient  Procedure: 2D Echo and Transesophageal Echo                                 MODIFIED REPORT  This report was modified by Skeet Latch MD on 08/02/2019 due to IVC not  seen. Indications:     TIA 435.9/G45.9  History:         Patient has prior history of Echocardiogram examinations, most                  recent 07/23/2019. ESRD.  Sonographer:     Vikki Ports [place] Referring Phys:  [place] Diagnosing Phys: Skeet Latch MD  PROCEDURE: Consent was requested emergently by emergency room physicain. The transesophogeal probe was passed through the esophogus of the patient. The patient's vital signs; including heart rate, blood pressure, and oxygen saturation; remained stable throughout the procedure. The patient developed no complications during the procedure. IMPRESSIONS  1.  Left ventricular ejection fraction, by visual estimation, is 25 to 30%. The left ventricle has moderate to severely decreased function. There is no left ventricular hypertrophy.  2. The left ventricle demonstrates global hypokinesis.  3. Global right ventricle has normal systolic function.The right ventricular size is normal. No increase in right ventricular wall thickness.  4. Left atrial size was normal.  5. Right atrial size was normal.  6. The mitral valve is normal in structure. Mild mitral valve regurgitation. No evidence of mitral stenosis.  7. The tricuspid valve is normal in structure. Tricuspid valve regurgitation is mild.  8. The aortic valve is abnormal. Aortic valve regurgitation is not visualized. No evidence of aortic valve sclerosis or stenosis.  9. Aortic valve area 2.0 cm^2 by planimetry. The left and non-coronary cusps are restricted. Functionally bicuspid. 10. The pulmonic valve was normal in structure. Pulmonic valve regurgitation is trivial. 11. Mild plaque invoving the descending aorta. FINDINGS  Left Ventricle: Left ventricular ejection fraction, by visual estimation, is 25 to 30%. The left ventricle has moderate to severely decreased function. The left ventricle demonstrates global hypokinesis. There is no left ventricular hypertrophy. Normal left atrial pressure. Right Ventricle: The right ventricular size is normal. No increase in right ventricular wall thickness. Global RV systolic function is has normal systolic function. Left Atrium: Left atrial size was normal in size. Right Atrium: Right atrial size was normal in size Pericardium: There is no evidence of pericardial effusion. Mitral Valve: The mitral valve is normal in structure. No evidence of mitral valve stenosis by observation. Mild mitral valve regurgitation. Tricuspid Valve: The tricuspid valve is normal in structure. Tricuspid valve regurgitation is mild. Aortic Valve: The aortic valve is abnormal. Aortic valve regurgitation is  not visualized. The aortic valve is structurally normal, with no evidence of sclerosis or stenosis. Aortic valve mean gradient measures 6.0 mmHg. Aortic valve peak gradient measures 9.2 mmHg. Aortic valve area, by VTI measures 1.04 cm. Aortic valve area 2.0 cm^2 by planimetry. The left and non-coronary cusps are restricted. Functionally bicuspid. Pulmonic Valve: The pulmonic valve was normal in structure. Pulmonic valve regurgitation is trivial. Aorta: The aortic root, ascending aorta and aortic arch are all structurally normal, with no evidence of dilitation or obstruction. There is mild, atheroma plaque involving the descending aorta. Shunts: Agitated saline contrast was given intravenously to evaluate for intracardiac shunting. Saline contrast bubble study was negative, with no evidence of any interatrial shunt. There is no evidence of a patent foramen ovale. No ventricular septal defect is seen or detected. There is no evidence of an atrial septal defect. No atrial level shunt detected by color flow Doppler.  LEFT VENTRICLE          Normals PLAX 2D LVOT diam:     1.90 cm  2.0 cm LVOT Area:  2.84 cm 3.14 cm2  AORTIC VALVE                    Normals AV Area (Vmax):    1.38 cm AV Area (Vmean):   1.11 cm     3.06 cm2 AV Area (VTI):     1.04 cm AV Vmax:           152.00 cm/s AV Vmean:          112.000 cm/s 77 cm/s AV VTI:            0.263 m      3.15 cm2 AV Peak Grad:      9.2 mmHg AV Mean Grad:      6.0 mmHg     3 mmHg LVOT Vmax:         74.20 cm/s LVOT Vmean:        43.900 cm/s  75 cm/s LVOT VTI:          0.096 m      25.3 cm LVOT/AV VTI ratio: 0.37         1 TRICUSPID VALVE             Normals TR Peak grad:   22.3 mmHg TR Vmax:        236.00 cm/s 288 cm/s  SHUNTS Systemic VTI:  0.10 m Systemic Diam: 1.90 cm  Skeet Latch MD Electronically signed by Skeet Latch MD Signature Date/Time: 08/02/2019/11:39:18 AM    Final (Updated)    Recent Labs    08/01/19 0637 08/02/19 1510  WBC 11.1* 9.4  HGB  11.4* 9.8*  HCT 35.9* 31.0*  PLT 153 148*   Recent Labs    08/02/19 1510  NA 128*  K 3.9  CL 89*  CO2 24  GLUCOSE 273*  BUN 86*  CREATININE 5.88*  CALCIUM 8.3*    Intake/Output Summary (Last 24 hours) at 08/03/2019 1845 Last data filed at 08/03/2019 [id number] Gross per 24 hour  Intake 350 ml  Output 300 ml  Net 50 ml     Physical Exam: Vital Signs Blood pressure (!) 86/45, pulse 82, temperature (!) 97.5 F (36.4 C), temperature source Oral, resp. rate 18, height 5\' 8"  (1.727 m), weight 53.4 kg, SpO2 100 %. Constitutional: No distress . Vital signs reviewed. Frail. HENT: Normocephalic.  Atraumatic. Eyes: EOMI. No discharge. Cardiovascular: No JVD. Respiratory: Normal effort.  No stridor. GI: Non-distended. GU: + Rectal tube Skin: Warm and dry.  Intact. Psych: Normal mood.  Normal behavior. Musc: No edema in extremities.  No tenderness in extremities. Neurological: Alert Motor: RUE/RLE: 0/5 proximal distal LUE: 4 -/5 proximal to distal LLE: 3 -/5 proximal to distal   Assessment/Plan: 1. 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  Physiatrist is providing close team supervision and 24 hour management of active medical problems listed below.  Physiatrist and rehab team continue to assess barriers to discharge/monitor patient progress toward functional and medical goals  Care Tool:  Bathing    Body parts bathed by patient: Right arm, Face, Chest   Body parts bathed by helper: Left arm, Abdomen(did not complete LB)     Bathing assist Assist Level: Total Assistance - Patient < 25%(to maintain sitting balance at EOB)     Upper Body Dressing/Undressing Upper body dressing   What is the patient wearing?: Hospital gown only    Upper body assist Assist Level:  Maximal Assistance - Patient 25 - 49%    Lower Body Dressing/Undressing Lower body dressing       What is the patient wearing?: Incontinence brief     Lower body assist Assist for lower body dressing: Dependent - Patient 0%(supine rolling)     Toileting Toileting    Toileting assist Assist for toileting: Dependent - Patient 0%     Transfers Chair/bed transfer  Transfers assist     Chair/bed transfer assist level: 2 Helpers     Locomotion Ambulation   Ambulation assist   Ambulation activity did not occur: Safety/medical concerns(poor postural control and decreased standing ability)          Walk 10 feet activity   Assist  Walk 10 feet activity did not occur: Safety/medical concerns        Walk 50 feet activity   Assist Walk 50 feet with 2 turns activity did not occur: Safety/medical concerns         Walk 150 feet activity   Assist Walk 150 feet activity did not occur: Safety/medical concerns         Walk 10 feet on uneven surface  activity   Assist Walk 10 feet on uneven surfaces activity did not occur: Safety/medical concerns         Wheelchair     Assist Will patient use wheelchair at discharge?: Yes Type of Wheelchair: Manual    Wheelchair assist level: Dependent - Patient 0% Max wheelchair distance: 150 ft    Wheelchair 50 feet with 2 turns activity    Assist        Assist Level: Dependent - Patient 0%   Wheelchair 150 feet activity     Assist      Assist Level: Dependent - Patient 0%   Blood pressure (!) 86/45, pulse 82, temperature (!) 97.5 F (36.4 C), temperature source Oral, resp. rate 18, height 5\' 8"  (1.727 m), weight 53.4 kg, SpO2 100 %.  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 Antithrombotics: -DVT/anticoagulation:SCDs -antiplatelet therapy: Aspirin 81 mg daily and Plavix 75 mg daily x3 weeks then aspirin  alone 3. Pain Management:Tylenol as needed 4. Mood:Xanax 1 mg nightly -antipsychotic agents: N/A 5. Neuropsych: This patientiscapable of making decisions on herown behalf. 6. Skin/Wound Care:Routine skin checks -local skin care for abrasions -nutrition discussed  -benadryl prn for itching  -sarna lotion prn 7. Fluids/Electrolytes/Nutrition:Routine and outs 8. ESRD. Hemodialysis per renal services. HD after therapy day to allow for better therapy participation 9. Orthostasis. Continue ProAmatine 10 mg Monday Wednesday Friday  Blood pressure soft on 12/19 10. Hyperlipidemia. Crestor 11. Acute on chronic combined congestive heart failure. Monitor for any signs of fluid overload. Daily weights  TEE suggesting EF of 35% with mild MR, TR, AS Filed Weights   08/02/19 1450 08/02/19 1822 08/03/19 0500  Weight: 53.2 kg 50.5 kg 53.4 kg    Stable on 12/19 12. Diabetes mellitus type II with hyperglycemia, hemoglobin A1c 5.3.   Continue SSI  Labile and elevated on 12/19 13. History of tobacco abuse. Counseling 14. CAD with CABG 2010. Continue aspirin. No chest pain reported. 15. IBS/history of GI bleed. hold Amitiza24 mcg daily - due to diarrhea  16.  Social son threatened to break through security to visit his mom  65.  Chronic cystitis  Seen by urology recommending Rocephin 2 g daily 18.  Diarrhea- C diff neg, likely Amitiza took this every other day at home to avoid diarrhea will hold until stools are reduced    Continue rectal tube  Will monitor closely with antibiotics 19.  Vaginal d/c ? Liquid stool vs infected urine: await GYN eval  LOS: 7 days A FACE TO FACE EVALUATION WAS PERFORMED  Kaidan Spengler Lorie Phenix 08/03/2019, 6:45 PM

## 2019-08-03 NOTE — Progress Notes (Signed)
Physical Therapy Session Note  Patient Details  Name: [name] MRN: [id number] Date of Birth: [birth year]  Today's Date: 08/03/2019 PT Individual Time: [id number] PT Individual Time Calculation (min): 31 min  Missed 29 mins due to pt eating lunch.   Short Term Goals: Week 1:  PT Short Term Goal 1 (Week 1): Pt will be able to demonstrate functional dynamic sitting balance wit mod assist PT Short Term Goal 2 (Week 1): Pt will be able to perform slideboard transfer with mod assist +2 PT Short Term Goal 3 (Week 1): Pt will be able to tolerate standing with lift equipment x 5 min for weightbearing/postural control retraining  Skilled Therapeutic Interventions/Progress Updates:   Pt received lying in bed, asleep, but was easily awakened and agreeable to therapy, despite being lethargic throughout mobility.  Performed rolling L total A with management for LEs and RUE.  Total A for SL>sit with management for LEs and trunk into sitting.  Once at EOB, requires total A for scooting and up to max A for sitting balance.  Tactile and verbal cues for upright posture, however pt is unable to maintain for any length of time.  PT set up slideboard with disposable chuck pad for safety during transfer.  Performed slide board transfer with total A +2 during session with cues for hand placement and assist for forward weight shift.  Once in chair, pt tilted for safety and verbalized that she would like to work on eating her lunch.  PT assisted with set up of meal tray and table, cutting up food and set up of silverware.  Pt verbalized that it seems as though therapy only helps her get OOB and then set up to eat.  PT to relay this message to team in efforts for more consistent scheduling and OOB schedule with nursing to allow pt time to eat prior to therapies.  Pt verbalized understanding.  Pt left in TIS w/c with seat belt alarm donned and all needs in reach.    Therapy Documentation Precautions:   Precautions Precautions: Fall Precaution Comments: mild Rt inattention; R hemiplegia; flexi-seal fecal management Restrictions Weight Bearing Restrictions: No  Pain:  Pt reports pain in buttocks when in TIS w/c, RN notified so that tilt may be increased when done eating.       Therapy/Group: Individual Therapy  Denice Bors 08/03/2019, 8:49 AM

## 2019-08-03 NOTE — Progress Notes (Addendum)
[facility] Progress Note   Subjective:   Patient seen and examined in room. Sitting in wheelchair, eating lunch.  Feeling better today.  Reports dialysis went well yesterday.  Denies SOB, CP, n/v/d, abdominal pain, and fatigue.   Objective Vitals:   08/03/19 0505 08/03/19 0506 08/03/19 1058 08/03/19 1241  BP: 105/63  (!) 87/47 (!) 86/45  Pulse: (!) 102 92 89 82  Resp:   20 18  Temp:   98 F (36.7 C) (!) 97.5 F (36.4 C)  TempSrc:   Oral Oral  SpO2:   96% 100%  Weight:      Height:       Physical Exam General:NAD, chronically ill appearing, elderly female Heart:RRR Lungs:CTAB Extremities:no LE edema Dialysis Access: R IJ [facility]   Filed Weights   08/02/19 1450 08/02/19 1822 08/03/19 0500  Weight: 53.2 kg 50.5 kg 53.4 kg    Intake/Output Summary (Last 24 hours) at 08/03/2019 1523 Last data filed at 08/03/2019 [id number] Gross per 24 hour  Intake 350 ml  Output 300 ml  Net 50 ml    Additional Objective Labs: Basic Metabolic Panel: Recent Labs  Lab 07/29/19 0726 07/31/19 1451 08/02/19 1510  NA 130* 130* 128*  K 5.1 4.9 3.9  CL 95* 91* 89*  CO2 18* 24 24  GLUCOSE 152* 383* 273*  BUN 78* 92* 86*  CREATININE 8.14* 6.82* 5.88*  CALCIUM 9.1 8.7* 8.3*  PHOS 4.2 3.2 3.1   Liver Function Tests: Recent Labs  Lab 07/29/19 0726 07/31/19 1451 08/02/19 1510  ALBUMIN 2.8* 2.6* 2.2*   No results for input(s): LIPASE, AMYLASE in the last 168 hours. CBC: Recent Labs  Lab 07/29/19 0726 07/31/19 1451 08/01/19 0637 08/02/19 1510  WBC 11.2* 12.2* 11.1* 9.4  NEUTROABS  --   --  7.7  --   HGB 11.7* 11.3* 11.4* 9.8*  HCT 37.4 36.5 35.9* 31.0*  MCV 109.0* 109.6* 107.5* 107.6*  PLT PLATELET CLUMPS NOTED ON SMEAR, COUNT APPEARS DECREASED 155 153 148*   Blood Culture    Component Value Date/Time   SDES URINE, CLEAN CATCH 07/31/2019 2205   SPECREQUEST  07/31/2019 2205    Normal Performed at [facility], [place] [address], [place], [place] [address]     CULT MULTIPLE SPECIES PRESENT, SUGGEST RECOLLECTION (A) 07/31/2019 2205   REPTSTATUS 08/01/2019 FINAL 07/31/2019 2205    Cardiac Enzymes: No results for input(s): CKTOTAL, CKMB, CKMBINDEX, TROPONINI in the last 168 hours. CBG: Recent Labs  Lab 08/02/19 1140 08/02/19 1837 08/02/19 2108 08/03/19 0617 08/03/19 1122  GLUCAP 156* 85 105* 146* 219*   Iron Studies: No results for input(s): IRON, TIBC, TRANSFERRIN, FERRITIN in the last 72 hours. Lab Results  Component Value Date   INR 1.0 07/23/2019   INR 1.07 04/18/2018   INR 1.4 10/16/2008   Studies/Results: CT RENAL STONE STUDY  Result Date: 08/01/2019 CLINICAL DATA:  Pyelonephritis. EXAM: CT ABDOMEN AND PELVIS WITHOUT CONTRAST TECHNIQUE: Multidetector CT imaging of the abdomen and pelvis was performed following the standard protocol without IV contrast. COMPARISON:  None. FINDINGS: Lower chest: No acute abnormality. Hepatobiliary: Normal liver. Small dependent stones in the gallbladder fundus. No wall thickening or inflammation. No bile duct dilation. Pancreas: Unremarkable. No pancreatic ductal dilatation or surrounding inflammatory changes. Spleen: Mild enlargement of the spleen, measuring 15 x 5 x 10 cm. No splenic mass or focal lesion. Adrenals/Urinary Tract: No adrenal masses. Bilateral renal cortical thinning/atrophy. No renal masses. No hydronephrosis. Ureters are normal in course and  in caliber. Bladder wall is mildly thickened, with the serosal margin somewhat ill-defined with the adjacent fat shows hazy opacity. Tiny amount of nondependent air in the bladder. No mass or stone. Stomach/Bowel: Stomach is unremarkable. Small bowel is normal in caliber. No wall thickening. No inflammation. Mild to moderate generalized increased stool burden in the colon. No colonic wall thickening or inflammation. Normal appendix visualized. Rectal tube distends the rectum. Vascular/Lymphatic: Dense aortic atherosclerotic calcifications extending to  the branch vessels. No enlarged lymph nodes. Reproductive: Apparent thickening of the endometrium versus endometrial fluid distending the endometrial canal. This widens the endometrium/canal to 27 mm. No adnexal masses. Other: No hernia or ascites. Musculoskeletal: No fracture or acute finding.  No bone lesion. IMPRESSION: 1. Bladder wall is thickened with adjacent inflammatory changes, findings consistent with cystitis. 2. Diffuse renal cortical thinning with decreased renal sizes. No hydronephrosis. No unenhanced CT evidence of pyelonephritis. 3. Thickened endometrium versus distended endometrial canal. Recommend follow-up pelvic ultrasound for further assessment. 4. Mild to moderate increased colonic stool burden. No bowel obstruction or inflammation. 5. Mild splenomegaly of unclear etiology. 6. Extensive aortic atherosclerotic calcifications. Electronically Signed   By: Lajean Manes M.D.   On: 08/01/2019 20:03   ECHO TEE  Result Date: 08/02/2019   TRANSESOPHOGEAL ECHO REPORT   Patient Name:   [name] Date of Exam: 08/02/2019 Medical Rec #:  [id number]     Height:       68.0 in Accession #:    [id number]    Weight:       110.7 lb Date of Birth:  May 05, 1941     BSA:          1.59 m Patient Age:    78 years      BP:           80/33 mmHg Patient Gender: F             HR:           99 bpm. Exam Location:  Inpatient  Procedure: 2D Echo and Transesophageal Echo                                 MODIFIED REPORT  This report was modified by Skeet Latch MD on 08/02/2019 due to IVC not                                      seen. Indications:     TIA 435.9/G45.9  History:         Patient has prior history of Echocardiogram examinations, most                  recent 07/23/2019. ESRD.  Sonographer:     Vikki Ports [place]ine Referring Phys:  [place] Diagnosing Phys: Skeet Latch MD  PROCEDURE: Consent was requested emergently by emergency room physicain. The transesophogeal probe was passed through  the esophogus of the patient. The patient's vital signs; including heart rate, blood pressure, and oxygen saturation; remained stable throughout the procedure. The patient developed no complications during the procedure. IMPRESSIONS  1. Left ventricular ejection fraction, by visual estimation, is 25 to 30%. The left ventricle has moderate to severely decreased function. There is no left ventricular hypertrophy.  2. The left ventricle demonstrates global hypokinesis.  3. Global right ventricle has normal systolic function.The right  ventricular size is normal. No increase in right ventricular wall thickness.  4. Left atrial size was normal.  5. Right atrial size was normal.  6. The mitral valve is normal in structure. Mild mitral valve regurgitation. No evidence of mitral stenosis.  7. The tricuspid valve is normal in structure. Tricuspid valve regurgitation is mild.  8. The aortic valve is abnormal. Aortic valve regurgitation is not visualized. No evidence of aortic valve sclerosis or stenosis.  9. Aortic valve area 2.0 cm^2 by planimetry. The left and non-coronary cusps are restricted. Functionally bicuspid. 10. The pulmonic valve was normal in structure. Pulmonic valve regurgitation is trivial. 11. Mild plaque invoving the descending aorta. FINDINGS  Left Ventricle: Left ventricular ejection fraction, by visual estimation, is 25 to 30%. The left ventricle has moderate to severely decreased function. The left ventricle demonstrates global hypokinesis. There is no left ventricular hypertrophy. Normal left atrial pressure. Right Ventricle: The right ventricular size is normal. No increase in right ventricular wall thickness. Global RV systolic function is has normal systolic function. Left Atrium: Left atrial size was normal in size. Right Atrium: Right atrial size was normal in size Pericardium: There is no evidence of pericardial effusion. Mitral Valve: The mitral valve is normal in structure. No evidence of mitral  valve stenosis by observation. Mild mitral valve regurgitation. Tricuspid Valve: The tricuspid valve is normal in structure. Tricuspid valve regurgitation is mild. Aortic Valve: The aortic valve is abnormal. Aortic valve regurgitation is not visualized. The aortic valve is structurally normal, with no evidence of sclerosis or stenosis. Aortic valve mean gradient measures 6.0 mmHg. Aortic valve peak gradient measures 9.2 mmHg. Aortic valve area, by VTI measures 1.04 cm. Aortic valve area 2.0 cm^2 by planimetry. The left and non-coronary cusps are restricted. Functionally bicuspid. Pulmonic Valve: The pulmonic valve was normal in structure. Pulmonic valve regurgitation is trivial. Aorta: The aortic root, ascending aorta and aortic arch are all structurally normal, with no evidence of dilitation or obstruction. There is mild, atheroma plaque involving the descending aorta. Shunts: Agitated saline contrast was given intravenously to evaluate for intracardiac shunting. Saline contrast bubble study was negative, with no evidence of any interatrial shunt. There is no evidence of a patent foramen ovale. No ventricular septal defect is seen or detected. There is no evidence of an atrial septal defect. No atrial level shunt detected by color flow Doppler.  LEFT VENTRICLE          Normals PLAX 2D LVOT diam:     1.90 cm  2.0 cm LVOT Area:     2.84 cm 3.14 cm2  AORTIC VALVE                    Normals AV Area (Vmax):    1.38 cm AV Area (Vmean):   1.11 cm     3.06 cm2 AV Area (VTI):     1.04 cm AV Vmax:           152.00 cm/s AV Vmean:          112.000 cm/s 77 cm/s AV VTI:            0.263 m      3.15 cm2 AV Peak Grad:      9.2 mmHg AV Mean Grad:      6.0 mmHg     3 mmHg LVOT Vmax:         74.20 cm/s LVOT Vmean:        43.900 cm/s  75  cm/s LVOT VTI:          0.096 m      25.3 cm LVOT/AV VTI ratio: 0.37         1 TRICUSPID VALVE             Normals TR Peak grad:   22.3 mmHg TR Vmax:        236.00 cm/s 288 cm/s  SHUNTS Systemic  VTI:  0.10 m Systemic Diam: 1.90 cm  Skeet Latch MD Electronically signed by Skeet Latch MD Signature Date/Time: 08/02/2019/11:39:18 AM    Final (Updated)     Medications: . sodium chloride    . sodium chloride    . cefTRIAXone (ROCEPHIN)  IV 2 g (08/03/19 0757)   . ALPRAZolam  1 mg Oral QHS  . aspirin EC  81 mg Oral Daily  . Chlorhexidine Gluconate Cloth  6 each Topical Q0600  . Chlorhexidine Gluconate Cloth  6 each Topical Q0600  . clopidogrel  75 mg Oral Daily  . feeding supplement (NEPRO CARB STEADY)  237 mL Oral BID BM  . feeding supplement (PRO-STAT SUGAR FREE 64)  30 mL Oral BID  . folic acid  1 mg Oral Daily  . gabapentin  200 mg Oral QHS  . Gerhardt's butt cream   Topical QID  . insulin aspart  0-9 Units Subcutaneous TID WC  . midodrine  10 mg Oral Q M,W,F-HD  . multivitamin  1 tablet Oral QHS  . rosuvastatin  20 mg Oral q1800  . sevelamer carbonate  1,600 mg Oral TID WC  . vitamin B-12  500 mcg Oral Daily    Dialysis Orders: MWF at [facility], 300/600, EDW 51.5kg, 2K/2.25Ca, TDC, UFP #4, heparin 1000 - Mircera 200 q 2 weeks Right IJ TDC - failed AVF  Assessment/Plan: 1. 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 TEE today, no thrombus/mass, mild MR & TR, bicuspid valve functional w/partial fusion of cusps, LVEF 35% w/gobal hypokinesis. PersistentR hemiparesis, speech intact.Statin started. Adm to CIR 12/12 2. ESRD:Continue HD per MWF.Resume tight heparin Dr. Jonnie Finner spoke w/ Dr Posey Pronto , cath placed CK Vasc in September 2020. HD yesterday tolerated well.  Next HD on 12/21. 3. Hypertension/volume:Permissive HTNinitially, now hypotensive - does not appear to have excess volume, titrate down as tolerated.  Midodrine with HD.   4. Anemia:Hgb11.4>9.8, stable - no ESA needed, if Hgb remains <10, will start ESA. 5. Metabolic bone disease:Ca/Phos to goal. Continue home Sevelamer as binder.-un beknownst to her dialysis unit, she tells me she was also taking  calcitriol at home - iPTH in goal - will d/c calcitriol and switch to TIW hectorol -home calcitriol should not be resumed at d/c 6. T2DM 7. 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 Nutrition - high risk for weight loss - intake improving- continue Nepro 9. Deconditioning/right hemiparesis - CIR adm 10. Dizziness with movement - for vestibular work up  [employer], PA-C [employer] Pager: [phone] 08/03/2019,3:23 PM  LOS: 7 days   I have seen and examined this patient and agree with plan and assessment in the above note with renal recommendations/intervention highlighted.  Governor Rooks Fardowsa Authier,MD 08/03/2019 3:55 PM

## 2019-08-03 NOTE — Progress Notes (Signed)
Occupational Therapy Session Note  Patient Details  Name: [name] MRN: [id number] Date of Birth: 1941/08/07  Today's Date: 08/03/2019 OT Individual Time: [id number] OT Individual Time Calculation (min): 70 min    Short Term Goals: Week 1:  OT Short Term Goal 1 (Week 1): Pt will maintain sitting balance with min assist to prepare for ADL tasks OT Short Term Goal 2 (Week 1): Pt will complete UB bathing with min assist OT Short Term Goal 3 (Week 1): Pt will complete LB bathing with max assist of 1 caregiver OT Short Term Goal 4 (Week 1): Pt will complete toilet transfer with max assist of one caregiver  Skilled Therapeutic Interventions/Progress Updates:    Treatment session with focus on bed mobility and static and dynamic sitting balance.  Pt received supine in bed agreeable to bathing seated EOB.  Engaged in rolling with Max assist and sidelying to sitting total assistance.  Pt initiating aspects of movements but still requiring total assist for mobility. Engaged in [place] bathing seated at EOB with focus on obtaining and maintaining static sitting balance.  Pt unable to maintain sitting balance for any amount of time this session, demonstrating posterior lean, lean both Rt and Lt, and pushing with LUE when in contact with bed rail.  Mod-total assist for sitting balance during UB bathing.  Pt able to reach into basin to grasp wash cloth with max assist for sitting balance. Returned to bed and set up in chair position to allow therapist to wash hair with cap.  Pt returned to sidelying for pressure relief and left with all needs in reach.  Therapy Documentation Precautions:  Precautions Precautions: Fall Precaution Comments: mild Rt inattention; R hemiplegia; flexi-seal fecal management Restrictions Weight Bearing Restrictions: No General:   Vital Signs: Therapy Vitals Temp: 98 F (36.7 C) Pulse Rate: 89 Resp: 20 BP: (!) 87/47 Patient Position (if appropriate): Lying Oxygen  Therapy SpO2: 96 % O2 Device: Room Air Pain:  Pt with no c/o pain   Therapy/Group: Individual Therapy  Rawlin Reaume, [place] 08/03/2019, 11:01 AM

## 2019-08-04 ENCOUNTER — Inpatient Hospital Stay (HOSPITAL_COMMUNITY): Payer: Medicare HMO

## 2019-08-04 ENCOUNTER — Inpatient Hospital Stay (HOSPITAL_COMMUNITY): Payer: Medicare HMO | Admitting: Occupational Therapy

## 2019-08-04 DIAGNOSIS — N186 End stage renal disease: Secondary | ICD-10-CM

## 2019-08-04 DIAGNOSIS — R5383 Other fatigue: Secondary | ICD-10-CM

## 2019-08-04 LAB — CBC
HCT: 30.1 % — ABNORMAL LOW (ref 36.0–46.0)
Hemoglobin: 9.8 g/dL — ABNORMAL LOW (ref 12.0–15.0)
MCH: 34 pg (ref 26.0–34.0)
MCHC: 32.6 g/dL (ref 30.0–36.0)
MCV: 104.5 fL — ABNORMAL HIGH (ref 80.0–100.0)
Platelets: 157 10*3/uL (ref 150–400)
RBC: 2.88 MIL/uL — ABNORMAL LOW (ref 3.87–5.11)
RDW: 16.7 % — ABNORMAL HIGH (ref 11.5–15.5)
WBC: 11.9 10*3/uL — ABNORMAL HIGH (ref 4.0–10.5)
nRBC: 0 % (ref 0.0–0.2)

## 2019-08-04 LAB — COMPREHENSIVE METABOLIC PANEL
ALT: 17 U/L (ref 0–44)
AST: 22 U/L (ref 15–41)
Albumin: 2.2 g/dL — ABNORMAL LOW (ref 3.5–5.0)
Alkaline Phosphatase: 77 U/L (ref 38–126)
Anion gap: 15 (ref 5–15)
BUN: 80 mg/dL — ABNORMAL HIGH (ref 8–23)
CO2: 21 mmol/L — ABNORMAL LOW (ref 22–32)
Calcium: 8.7 mg/dL — ABNORMAL LOW (ref 8.9–10.3)
Chloride: 94 mmol/L — ABNORMAL LOW (ref 98–111)
Creatinine, Ser: 5.36 mg/dL — ABNORMAL HIGH (ref 0.44–1.00)
GFR calc Af Amer: 8 mL/min — ABNORMAL LOW (ref >60)
GFR calc non Af Amer: 7 mL/min — ABNORMAL LOW (ref >60)
Glucose, Bld: 155 mg/dL — ABNORMAL HIGH (ref 70–99)
Potassium: 3.5 mmol/L (ref 3.5–5.1)
Sodium: 130 mmol/L — ABNORMAL LOW (ref 135–145)
Total Bilirubin: 0.5 mg/dL (ref 0.3–1.2)
Total Protein: 5.9 g/dL — ABNORMAL LOW (ref 6.5–8.1)

## 2019-08-04 LAB — GLUCOSE, CAPILLARY
Glucose-Capillary: 142 mg/dL — ABNORMAL HIGH (ref 70–99)
Glucose-Capillary: 162 mg/dL — ABNORMAL HIGH (ref 70–99)
Glucose-Capillary: 125 mg/dL — ABNORMAL HIGH (ref 70–99)
Glucose-Capillary: 197 mg/dL — ABNORMAL HIGH (ref 70–99)
Glucose-Capillary: 214 mg/dL — ABNORMAL HIGH (ref 70–99)

## 2019-08-04 LAB — URINALYSIS, MICROSCOPIC (REFLEX)
RBC / HPF: >50 RBC/hpf (ref 0–5)
Squamous Epithelial / HPF: NONE SEEN (ref 0–5)
WBC, UA: >50 WBC/hpf (ref 0–5)

## 2019-08-04 LAB — URINALYSIS, ROUTINE W REFLEX MICROSCOPIC
RBC / HPF: >50 RBC/hpf — ABNORMAL HIGH (ref 0–5)
WBC, UA: >50 WBC/hpf — ABNORMAL HIGH (ref 0–5)

## 2019-08-04 LAB — LACTIC ACID, PLASMA: Lactic Acid, Venous: 0.8 mmol/L (ref 0.5–1.9)

## 2019-08-04 MED ORDER — SODIUM CHLORIDE 0.9 % IV BOLUS
500.0000 mL | Freq: Once | INTRAVENOUS | Status: AC | PRN
  Administered 2019-08-04: 500 mL via INTRAVENOUS

## 2019-08-04 MED ORDER — VANCOMYCIN HCL 1250 MG/250ML IV SOLN
1250.0000 mg | Freq: Once | INTRAVENOUS | Status: AC
  Administered 2019-08-04: 1250 mg via INTRAVENOUS
  Filled 2019-08-04: qty 250

## 2019-08-04 MED ORDER — METOCLOPRAMIDE HCL 5 MG PO TABS
5.0000 mg | ORAL_TABLET | Freq: Three times a day (TID) | ORAL | Status: DC
  Administered 2019-08-04 – 2019-08-06 (×7): 5 mg via ORAL
  Filled 2019-08-04 (×8): qty 1

## 2019-08-04 MED ORDER — CHLORHEXIDINE GLUCONATE CLOTH 2 % EX PADS
6.0000 | MEDICATED_PAD | Freq: Every day | CUTANEOUS | Status: DC
  Administered 2019-08-05 – 2019-08-06 (×2): 6 via TOPICAL

## 2019-08-04 MED ORDER — VANCOMYCIN HCL 500 MG/100ML IV SOLN
500.0000 mg | INTRAVENOUS | Status: DC
  Filled 2019-08-04: qty 100

## 2019-08-04 MED ORDER — PIPERACILLIN-TAZOBACTAM IN DEX 2-0.25 GM/50ML IV SOLN
2.2500 g | Freq: Three times a day (TID) | INTRAVENOUS | Status: DC
  Administered 2019-08-04 – 2019-08-07 (×7): 2.25 g via INTRAVENOUS
  Filled 2019-08-04 (×9): qty 50

## 2019-08-04 NOTE — Progress Notes (Signed)
[facility] Progress Note   Subjective:   Patient seen and examined at bedside.  No new complaints.  Objective Vitals:   08/04/19 0517 08/04/19 0550 08/04/19 0837 08/04/19 1333  BP: (!) 98/58  102/61 107/61  Pulse: (!) 114 (!) 108 (!) 103 95  Resp: 14  18   Temp: 99.8 F (37.7 C)  98.4 F (36.9 C) 98.5 F (36.9 C)  TempSrc: Oral  Oral   SpO2: 97%  96% 100%  Weight: 55.4 kg     Height:       Physical Exam General:NAD, chronically ill appearing female, sitting up in bed Heart:RRR Lungs:CTAB Abdomen:soft, NTND Extremities:no LE edema Dialysis Access: R Ij [facility]   Filed Weights   08/02/19 1822 08/03/19 0500 08/04/19 0517  Weight: 50.5 kg 53.4 kg 55.4 kg    Intake/Output Summary (Last 24 hours) at 08/04/2019 1552 Last data filed at 08/04/2019 1100 Gross per 24 hour  Intake 100 ml  Output 130 ml  Net -30 ml    Additional Objective Labs: Basic Metabolic Panel: Recent Labs  Lab 07/29/19 0726 07/31/19 1451 08/02/19 1510 08/04/19 1025  NA 130* 130* 128* 130*  K 5.1 4.9 3.9 3.5  CL 95* 91* 89* 94*  CO2 18* 24 24 21*  GLUCOSE 152* 383* 273* 155*  BUN 78* 92* 86* 80*  CREATININE 8.14* 6.82* 5.88* 5.36*  CALCIUM 9.1 8.7* 8.3* 8.7*  PHOS 4.2 3.2 3.1  --    Liver Function Tests: Recent Labs  Lab 07/31/19 1451 08/02/19 1510 08/04/19 1025  AST  --   --  22  ALT  --   --  17  ALKPHOS  --   --  77  BILITOT  --   --  0.5  PROT  --   --  5.9*  ALBUMIN 2.6* 2.2* 2.2*   No results for input(s): LIPASE, AMYLASE in the last 168 hours. CBC: Recent Labs  Lab 07/29/19 0726 07/31/19 1451 08/01/19 0637 08/02/19 1510 08/04/19 1025  WBC 11.2* 12.2* 11.1* 9.4 11.9*  NEUTROABS  --   --  7.7  --   --   HGB 11.7* 11.3* 11.4* 9.8* 9.8*  HCT 37.4 36.5 35.9* 31.0* 30.1*  MCV 109.0* 109.6* 107.5* 107.6* 104.5*  PLT PLATELET CLUMPS NOTED ON SMEAR, COUNT APPEARS DECREASED 155 153 148* 157   Blood Culture    Component Value Date/Time   SDES URINE, CLEAN  CATCH 07/31/2019 2205   SPECREQUEST  07/31/2019 2205    Normal Performed at [facility], [place] [address], [place], [place] [address]    CULT MULTIPLE SPECIES PRESENT, SUGGEST RECOLLECTION (A) 07/31/2019 2205   REPTSTATUS 08/01/2019 FINAL 07/31/2019 2205    Cardiac Enzymes: No results for input(s): CKTOTAL, CKMB, CKMBINDEX, TROPONINI in the last 168 hours. CBG: Recent Labs  Lab 08/03/19 2219 08/03/19 2303 08/04/19 0608 08/04/19 0900 08/04/19 1137  GLUCAP 372* 293* 142* 162* 125*   Iron Studies: No results for input(s): IRON, TIBC, TRANSFERRIN, FERRITIN in the last 72 hours. Lab Results  Component Value Date   INR 1.0 07/23/2019   INR 1.07 04/18/2018   INR 1.4 10/16/2008   Studies/Results: DG Chest 2 View  Result Date: 08/04/2019 CLINICAL DATA:  Patient is lethargic. EXAM: CHEST - 2 VIEW COMPARISON:  None. FINDINGS: Stable cardiomegaly. The hila and mediastinum are unchanged. Vascular calcifications are noted. A double lumen central line terminates in the right side of the atrium, stable. No nodule, mass, infiltrate, or overt edema. IMPRESSION: No  active cardiopulmonary disease. Stable double lumen central line terminating in the right side of the atrium. Electronically Signed   By: Dorise Bullion III M.D   On: 08/04/2019 12:08   DG Abd 1 View  Result Date: 08/04/2019 CLINICAL DATA:  Abdominal distension. EXAM: ABDOMEN - 1 VIEW COMPARISON:  August 01, 2019 FINDINGS: Mild prominence of large and small bowel suggest the possibility of developing ileus. Moderate fecal loading in the left colon. Evaluation for free air is limited on supine imaging but none is seen. Sign IMPRESSION: Air-filled prominent loops of large and small bowel suggests developing ileus. Recommend attention on follow-up. Moderate fecal loading. No other acute abnormalities. Electronically Signed   By: Dorise Bullion III M.D   On: 08/04/2019 13:41    Medications: . sodium chloride    . sodium  chloride    . cefTRIAXone (ROCEPHIN)  IV 2 g (08/04/19 0844)   . aspirin EC  81 mg Oral Daily  . Chlorhexidine Gluconate Cloth  6 each Topical Q0600  . Chlorhexidine Gluconate Cloth  6 each Topical Q0600  . clopidogrel  75 mg Oral Daily  . feeding supplement (NEPRO CARB STEADY)  237 mL Oral BID BM  . feeding supplement (PRO-STAT SUGAR FREE 64)  30 mL Oral BID  . folic acid  1 mg Oral Daily  . gabapentin  200 mg Oral QHS  . Gerhardt's butt cream   Topical QID  . insulin aspart  0-9 Units Subcutaneous TID WC  . metoCLOPramide  5 mg Oral TID AC  . midodrine  10 mg Oral Q M,W,F-HD  . multivitamin  1 tablet Oral QHS  . rosuvastatin  20 mg Oral q1800  . sevelamer carbonate  1,600 mg Oral TID WC  . vitamin B-12  500 mcg Oral Daily    Dialysis Orders: MWF at [facility] 4hr, 300/600, EDW 51.5kg, 2K/2.25Ca, TDC, UFP #4, heparin 1000 - Mircera 200 q 2 weeks Right IJ TDC - failed AVF  Assessment/Plan: 1. 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 Adm to CIR 12/12 2. ESRD:Continue HD per MWF.Resume tight heparin Dr. Jonnie Finner spoke w/ Dr Posey Pronto , cath placed CK Vasc in September 2020.Orders written for HD tomorrow per regular schedule. 3. Hypertension/volume:Permissive HTNinitially, now hypotensive -does not appear to have excess volume, titrate down as tolerated. Midodrine with HD.  4. Anemia:Hgb11.4>9.8,stable - no ESA needed, if Hgb remains <10, will start ESA. 5. Metabolic bone disease:Ca/Phos to goal. Continue home Sevelamer as binder.-un beknownst to her dialysis unit, she tells me she was also taking calcitriol at home - iPTH in goal - will d/c calcitriol and switch to TIW hectorol -home calcitriol should not be resumed at d/c 6. T2DM 7. 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 Nutrition - high risk for weight loss - intake improving- continue Nepro 9. Deconditioning/right hemiparesis - CIR adm 10. Dizziness with movement - for vestibular work up  Jen Mow, PA-C [employer] Pager: [phone] 08/04/2019,3:52 PM  LOS: 8 days

## 2019-08-04 NOTE — Progress Notes (Signed)
Occupational Therapy Session Note  Patient Details  Name: BILLIEJO HENRICH MRN: [id number] Date of Birth: 11/24/1940  Today's Date: 08/04/2019 OT Individual Time: 1002-1010 OT Individual Time Calculation (min): 8 min  52 minutes missed   Short Term Goals: Week 1:  OT Short Term Goal 1 (Week 1): Pt will maintain sitting balance with min assist to prepare for ADL tasks OT Short Term Goal 2 (Week 1): Pt will complete UB bathing with min assist OT Short Term Goal 3 (Week 1): Pt will complete LB bathing with max assist of 1 caregiver OT Short Term Goal 4 (Week 1): Pt will complete toilet transfer with max assist of one caregiver    Skilled Therapeutic Interventions/Progress Updates:    Pt seen for scheduled OT with RN present. RN stated a transporter was on the way to bring pt to x-ray. Pt leaning heavily towards Lt side while in bed, also low in bed. Assisted RN with boosting pt up and repositioning pt with pillows to promote neutral alignment. With encouragement and increased time, pt providing feedback in regards to how she'd like bed readjusted afterwards. Pt left in care of x-ray transporter. Time missed due to medical procedure.    Therapy Documentation Precautions:  Precautions Precautions: Fall Precaution Comments: mild Rt inattention; R hemiplegia; flexi-seal fecal management Restrictions Weight Bearing Restrictions: No Vital Signs: Therapy Vitals Temp: 98.4 F (36.9 C) Temp Source: Oral Pulse Rate: (!) 103 Resp: 18 BP: 102/61 Patient Position (if appropriate): Lying Oxygen Therapy SpO2: 96 % O2 Device: Room Air Pain: Pain Assessment Pain Scale: 0-10 Pain Score: 0-No pain ADL: ADL Eating: Set up Where Assessed-Eating: Chair Upper Body Bathing: Moderate assistance Where Assessed-Upper Body Bathing: Edge of bed Lower Body Bathing: Dependent Where Assessed-Lower Body Bathing: Bed level Upper Body Dressing: Maximal assistance Where Assessed-Upper Body Dressing: Edge of  bed Lower Body Dressing: Dependent(+2) Where Assessed-Lower Body Dressing: Edge of bed Toilet Transfer: Maximal assistance(+2) Toilet Transfer Method: Stand pivot Toilet Transfer Equipment: Drop arm bedside commode     Therapy/Group: Individual Therapy  Milanie Rosenfield A Thresa Dozier 08/04/2019, 12:10 PM

## 2019-08-04 NOTE — Progress Notes (Signed)
Patient was bladder scanned at 0853 with 397 ml. Patient went down for xray. When she returned, she was bladder scanned again for 148 ml. She was in and out catheterized with an output of 30 ml. UOP was thick, blood tinged-brown colored. Urinalysis was sent to lab. Provider was notified and a KUB was ordered d/t abdominal distention.

## 2019-08-04 NOTE — Progress Notes (Signed)
Physical Therapy Session Note  Patient Details  Name: [name] MRN: [id number] Date of Birth: Jul 30, 1941  Today's Date: 08/04/2019 PT Individual Time: [id number] PT Individual Time Calculation (min): 58 min    Short Term Goals: Week 1:  PT Short Term Goal 1 (Week 1): Pt will be able to demonstrate functional dynamic sitting balance wit mod assist PT Short Term Goal 2 (Week 1): Pt will be able to perform slideboard transfer with mod assist +2 PT Short Term Goal 3 (Week 1): Pt will be able to tolerate standing with lift equipment x 5 min for weightbearing/postural control retraining  Skilled Therapeutic Interventions/Progress Updates:    Pt supine in bed upon PT arrival, agreeable to therapy tx and reports pain 5/10 in her "bottom." Pt transferred to sidelying with mod assist and sideling>sitting with max assist. Once at EOB, requires total A for scooting and up to max A for sitting balance.  Tactile and verbal cues for upright posture, however pt is unable to maintain for any length of time.  PT set up slideboard with disposable chuck pad for safety during transfer.  Performed slide board transfer with total A +2 during session with cues for hand placement and assist for forward weight shift.Pt transported to the dayroom, seated in w/c used kinetron this session in order to try activating R hip extensors, pt pedals with assist from therapist to push down, minimal R hip extensor activation noted. Pt seated in TIS performed x 10 L LE LAQ and x 10 L LE hip flexion for strengthening. Pt transported to the ortho gym and used standing frame this session working on standing tolerance, LE weightbearing and upright posture, pt performed x 2 trials in standing frame 30-60 sec bouts with second person assist to help bring UE s up onto table, therapist assisting with trunk and head control to increase extension and help maintain midline, pt unable to achieve full hip/knee extension today. Pt transported back  to room and left in TIS with needs in reach and chair alarm set.   Therapy Documentation Precautions:  Precautions Precautions: Fall Precaution Comments: mild Rt inattention; R hemiplegia; flexi-seal fecal management Restrictions Weight Bearing Restrictions: No    Therapy/Group: Individual Therapy  Netta Corrigan, PT, DPT, CSRS 08/04/2019, 1:02 PM

## 2019-08-04 NOTE — Progress Notes (Signed)
Notified provider patient's rectal tube dislodged and is intact; and patient had a large semi-formed bowel movement.

## 2019-08-04 NOTE — Progress Notes (Signed)
Patient lethargic but answering questions then falling back asleep. No other symptoms besides lethargy. Not appropriate to give oral medications at this time. Assessed, checked her VS and repeat CBG. Notified Dr. Posey Pronto, see new orders.

## 2019-08-04 NOTE — Progress Notes (Addendum)
Please see previous notes. Called by nursing regarding fever, rapid response ordered fluid bolus.  Repeat labs ordered.  Tylenol ordered. Broad spectrum abx initiated. Called husband and updated him.

## 2019-08-04 NOTE — Plan of Care (Signed)
Problem: RH BOWEL ELIMINATION Goal: RH STG MANAGE BOWEL WITH ASSISTANCE Description: STG Manage Bowel with mod Assistance. Outcome: Progressing Goal: RH STG MANAGE BOWEL W/MEDICATION W/ASSISTANCE Description: STG Manage Bowel with Medication with mod I/supervision Assistance. Outcome: Progressing   Problem: RH BLADDER ELIMINATION Goal: RH STG MANAGE BLADDER WITH ASSISTANCE Description: STG Manage Bladder With mod Assistance Outcome: Progressing   Problem: RH SKIN INTEGRITY Goal: RH STG SKIN FREE OF INFECTION/BREAKDOWN Description: Pt has stage 2 to buttocks. Will be staged 1 or improved prior to DC with mod assist Outcome: Progressing Goal: RH STG MAINTAIN SKIN INTEGRITY WITH ASSISTANCE Description: STG Maintain Skin Integrity With mod Assistance. Outcome: Progressing Goal: RH STG ABLE TO PERFORM INCISION/WOUND CARE W/ASSISTANCE Description: STG Able To Perform Incision/Wound Care With mod Assistance. Outcome: Progressing   Problem: RH SAFETY Goal: RH STG ADHERE TO SAFETY PRECAUTIONS W/ASSISTANCE/DEVICE Description: STG Adhere to Safety Precautions With mod Assistance/Device. Outcome: Progressing Goal: RH STG DECREASED RISK OF FALL WITH ASSISTANCE Description: STG Decreased Risk of Fall With reminders/cues Assistance. Outcome: Progressing   Problem: RH COGNITION-NURSING Goal: RH STG USES MEMORY AIDS/STRATEGIES W/ASSIST TO PROBLEM SOLVE Description: STG Uses Memory Aids/Strategies With min Assistance to Problem Solve. Outcome: Progressing Goal: RH STG ANTICIPATES NEEDS/CALLS FOR ASSIST W/ASSIST/CUES Description: STG Anticipates Needs/Calls for Assist With min Assistance/Cues. Outcome: Progressing   Problem: RH PAIN MANAGEMENT Goal: RH STG PAIN MANAGED AT OR BELOW PT'S PAIN GOAL Description: Less than 3 on 0-10 scale Outcome: Progressing   Problem: RH KNOWLEDGE DEFICIT Goal: RH STG INCREASE KNOWLEDGE OF DIABETES Description: Pt and husband will be able to verbalize 2 ways  to manage DM with medication and diet regimen with min assist Outcome: Progressing Goal: RH STG INCREASE KNOWLEDGE OF HYPERTENSION Description: Pt and husband will be able to verbalize 2 ways to manage HTN with medication and diet regimen with min assist Outcome: Progressing Goal: RH STG INCREASE KNOWLEDGE OF STROKE PROPHYLAXIS Description: Pt and husband will be able to verbalize 2 ways to manage stroke prophylaxis with medication and diet regimen with min assist Outcome: Progressing

## 2019-08-04 NOTE — Progress Notes (Signed)
Pharmacy Antibiotic Note  [name] is a 78 y.o. female admitted on 07/27/2019 with acute CVA. Pharmacy has been consulted for vancomycin and Zosyn dosing with concern for sepsis. Pt has ESRD on HD MWF (last session 12/18).  Plan: Vancomycin 1250mg  IV x1 then 500mg  qHD MWF Zosyn 2.25g IV q8h Stop ceftriaxone F/U cultures, LOT, HD schedule with holidays   Height: 5\' 8"  (172.7 cm) Weight: 122 lb 2.2 oz (55.4 kg) IBW/kg (Calculated) : 63.9  Temp (24hrs), Avg:99.6 F (37.6 C), Min:98.3 F (36.8 C), Max:102.9 F (39.4 C)  Recent Labs  Lab 07/29/19 0726 07/31/19 1451 08/01/19 0637 08/02/19 1510 08/04/19 1025  WBC 11.2* 12.2* 11.1* 9.4 11.9*  CREATININE 8.14* 6.82*  --  5.88* 5.36*    Estimated Creatinine Clearance: 7.7 mL/min (A) (by C-G formula based on SCr of 5.36 mg/dL (H)).    Allergies  Allergen Reactions  . Doxycycline Nausea And Vomiting    Caused "DEATHLY NAUSEA AND VOMITING"  . Lipitor [Atorvastatin] Other (See Comments)    Stomach pain  . Strawberry Extract Rash    Antimicrobials this admission: Vancomycin 12/20 >>  Zosyn 12/20 >>  Ceftriaxone 12/18 >> 12/20    Thank you for allowing pharmacy to be a part of this patient's care.   Arrie Senate, PharmD, BCPS Clinical Pharmacist [phone] Please check AMION for all [facility] numbers 08/04/2019

## 2019-08-04 NOTE — Significant Event (Addendum)
Rapid Response Event Note  Overview: Called d/t decreased LOC. Pt in rehab s/p L ACA territory infarction. Time Called: 2103 Arrival Time: 2110 Event Type: Neurologic  Initial Focused Assessment: Pt laying in bed with eyes closed. Pt will respond to name, open eyes briefly, and follow commands with L side(pt has R sided hemiplegia at baseline).  At this time, pt will moan but not speak and goes back to sleep very easily after exam. Lungs clear and diminished t/o. T-102.9, HR-118, BP-80/53, RR-24, SpO2-97% on RA.  Pt has been having liquid stools(C. Diff negative on 12/16). KUB today suggesting ileus. Pt also had U/A and cx sent today showing many bacteria present.  Pt has been on Rocephin 2g since 12/18 for chronic cystitis.   Pt last HD Friday.   Interventions: CBG-214 500cc NS bolus x1 given for hypotension>pt more awake after bolus given, says "I'm better." BP-111/54 BC x 2, vanc/zosyn Plan of Care (if not transferred): Continue to monitor pt closely. Call RRT if further assistance needed.  Event Summary:   at      at          [facility], Carren Rang

## 2019-08-04 NOTE — Progress Notes (Addendum)
Rectal tube came out during q2hour turn. Pt cleaned and rectal tub reinserted with 35mL of NS. Pt still having liquid stool with some leakage around anus. Area still red but blanchable, small area on right of gluteal cleft about size of quarter has sheer or stage 2 partial thickness skin loss. Gerhard's butt cream applied after cleaned and new foam applied pt placed in brief and lying right side.   Low grade temp 99.6 and grimacing while movement, tylenol given. Pulse 115 by tech, 108 by RN ,manual and second doppler-Dr. Posey Pronto notified.  CBG this morning 142. (down from the original 404 last night-see previous note).    Pt has not voided during shift-oliguric due to HD-, bladder scan at 0600 was 331.

## 2019-08-04 NOTE — Progress Notes (Signed)
Brief Note:  Please see progress note from earlier. KUB suggestive of ileus.  Clear liquid diet ordered. Rectal tube in place. Reglan initiated. Repeat KUB ordered for tomorrow.

## 2019-08-04 NOTE — Progress Notes (Addendum)
[facility] PHYSICAL MEDICINE & REHABILITATION PROGRESS NOTE   Subjective/Complaints: Patient seen laying in bed this morning.  She states she slept well overnight.  Rectal tube was dislodged last night, replaced.  This morning discussed heart rate with nursing.  Later called by nursing indicating patient overall more lethargic, but vital signs stable and awakens/follows commands.  ROS: Denies CP, SOB, N/V.  Objective:   ECHO TEE  Result Date: 08/02/2019   TRANSESOPHOGEAL ECHO REPORT   Patient Name:   [name] Date of Exam: 08/02/2019 Medical Rec #:  [id number]     Height:       68.0 in Accession #:    [id number]    Weight:       110.7 lb Date of Birth:  [birth year]     BSA:          1.59 m Patient Age:    78 years      BP:           80/33 mmHg Patient Gender: F             HR:           99 bpm. Exam Location:  Inpatient  Procedure: 2D Echo and Transesophageal Echo                                 MODIFIED REPORT  This report was modified by Skeet Latch MD on 08/02/2019 due to IVC not                                      seen. Indications:     TIA 435.9/G45.9  History:         Patient has prior history of Echocardiogram examinations, most                  recent 07/23/2019. ESRD.  Sonographer:     Vikki Ports Turrentine Referring Phys:  [place] Diagnosing Phys: Skeet Latch MD  PROCEDURE: Consent was requested emergently by emergency room physicain. The transesophogeal probe was passed through the esophogus of the patient. The patient's vital signs; including heart rate, blood pressure, and oxygen saturation; remained stable throughout the procedure. The patient developed no complications during the procedure. IMPRESSIONS  1. Left ventricular ejection fraction, by visual estimation, is 25 to 30%. The left ventricle has moderate to severely decreased function. There is no left ventricular hypertrophy.  2. The left ventricle demonstrates global hypokinesis.  3. Global right ventricle  has normal systolic function.The right ventricular size is normal. No increase in right ventricular wall thickness.  4. Left atrial size was normal.  5. Right atrial size was normal.  6. The mitral valve is normal in structure. Mild mitral valve regurgitation. No evidence of mitral stenosis.  7. The tricuspid valve is normal in structure. Tricuspid valve regurgitation is mild.  8. The aortic valve is abnormal. Aortic valve regurgitation is not visualized. No evidence of aortic valve sclerosis or stenosis.  9. Aortic valve area 2.0 cm^2 by planimetry. The left and non-coronary cusps are restricted. Functionally bicuspid. 10. The pulmonic valve was normal in structure. Pulmonic valve regurgitation is trivial. 11. Mild plaque invoving the descending aorta. FINDINGS  Left Ventricle: Left ventricular ejection fraction, by visual estimation, is 25 to 30%. The left ventricle has moderate to severely decreased function. The  left ventricle demonstrates global hypokinesis. There is no left ventricular hypertrophy. Normal left atrial pressure. Right Ventricle: The right ventricular size is normal. No increase in right ventricular wall thickness. Global RV systolic function is has normal systolic function. Left Atrium: Left atrial size was normal in size. Right Atrium: Right atrial size was normal in size Pericardium: There is no evidence of pericardial effusion. Mitral Valve: The mitral valve is normal in structure. No evidence of mitral valve stenosis by observation. Mild mitral valve regurgitation. Tricuspid Valve: The tricuspid valve is normal in structure. Tricuspid valve regurgitation is mild. Aortic Valve: The aortic valve is abnormal. Aortic valve regurgitation is not visualized. The aortic valve is structurally normal, with no evidence of sclerosis or stenosis. Aortic valve mean gradient measures 6.0 mmHg. Aortic valve peak gradient measures 9.2 mmHg. Aortic valve area, by VTI measures 1.04 cm. Aortic valve area 2.0  cm^2 by planimetry. The left and non-coronary cusps are restricted. Functionally bicuspid. Pulmonic Valve: The pulmonic valve was normal in structure. Pulmonic valve regurgitation is trivial. Aorta: The aortic root, ascending aorta and aortic arch are all structurally normal, with no evidence of dilitation or obstruction. There is mild, atheroma plaque involving the descending aorta. Shunts: Agitated saline contrast was given intravenously to evaluate for intracardiac shunting. Saline contrast bubble study was negative, with no evidence of any interatrial shunt. There is no evidence of a patent foramen ovale. No ventricular septal defect is seen or detected. There is no evidence of an atrial septal defect. No atrial level shunt detected by color flow Doppler.  LEFT VENTRICLE          Normals PLAX 2D LVOT diam:     1.90 cm  2.0 cm LVOT Area:     2.84 cm 3.14 cm2  AORTIC VALVE                    Normals AV Area (Vmax):    1.38 cm AV Area (Vmean):   1.11 cm     3.06 cm2 AV Area (VTI):     1.04 cm AV Vmax:           152.00 cm/s AV Vmean:          112.000 cm/s 77 cm/s AV VTI:            0.263 m      3.15 cm2 AV Peak Grad:      9.2 mmHg AV Mean Grad:      6.0 mmHg     3 mmHg LVOT Vmax:         74.20 cm/s LVOT Vmean:        43.900 cm/s  75 cm/s LVOT VTI:          0.096 m      25.3 cm LVOT/AV VTI ratio: 0.37         1 TRICUSPID VALVE             Normals TR Peak grad:   22.3 mmHg TR Vmax:        236.00 cm/s 288 cm/s  SHUNTS Systemic VTI:  0.10 m Systemic Diam: 1.90 cm  Skeet Latch MD Electronically signed by Skeet Latch MD Signature Date/Time: 08/02/2019/11:39:18 AM    Final (Updated)    Recent Labs    08/02/19 1510  WBC 9.4  HGB 9.8*  HCT 31.0*  PLT 148*   Recent Labs    08/02/19 1510  NA 128*  K 3.9  CL 89*  CO2  24  GLUCOSE 273*  BUN 86*  CREATININE 5.88*  CALCIUM 8.3*    Intake/Output Summary (Last 24 hours) at 08/04/2019 0907 Last data filed at 08/03/2019 1856 Gross per 24 hour   Intake 270 ml  Output 400 ml  Net -130 ml     Physical Exam: Vital Signs Blood pressure 102/61, pulse (!) 103, temperature 98.4 F (36.9 C), temperature source Oral, resp. rate 18, height 5\' 8"  (1.727 m), weight 55.4 kg, SpO2 96 %. Constitutional: No distress . Vital signs reviewed.  Frail. HENT: Normocephalic.  Atraumatic. Eyes: EOMI. No discharge. Cardiovascular: No JVD. Respiratory: Normal effort.  No stridor. GI: Mild distention. GU: + Rectal tube Skin: Warm and dry.  Intact. Psych: Normal mood.  Normal behavior. Musc: No edema in extremities.  No tenderness in extremities. Neurological: Alert Motor: RUE/RLE: 0/5 proximal distal, unchanged LUE: 4 -/5 proximal to distal LLE: 3 -/5 proximal to distal  Assessment/Plan: 1. 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  Physiatrist is providing close team supervision and 24 hour management of active medical problems listed below.  Physiatrist and rehab team continue to assess barriers to discharge/monitor patient progress toward functional and medical goals  Care Tool:  Bathing    Body parts bathed by patient: Right arm, Face, Chest   Body parts bathed by helper: Left arm, Abdomen(did not complete LB)     Bathing assist Assist Level: Total Assistance - Patient < 25%(to maintain sitting balance at EOB)     Upper Body Dressing/Undressing Upper body dressing   What is the patient wearing?: Hospital gown only    Upper body assist Assist Level: Maximal Assistance - Patient 25 - 49%    Lower Body Dressing/Undressing Lower body dressing      What is the patient wearing?: Incontinence brief     Lower body assist Assist for lower body dressing: Dependent - Patient 0%(supine rolling)     Toileting Toileting    Toileting assist Assist for toileting: Dependent - Patient 0%     Transfers Chair/bed  transfer  Transfers assist     Chair/bed transfer assist level: 2 Helpers     Locomotion Ambulation   Ambulation assist   Ambulation activity did not occur: Safety/medical concerns(poor postural control and decreased standing ability)          Walk 10 feet activity   Assist  Walk 10 feet activity did not occur: Safety/medical concerns        Walk 50 feet activity   Assist Walk 50 feet with 2 turns activity did not occur: Safety/medical concerns         Walk 150 feet activity   Assist Walk 150 feet activity did not occur: Safety/medical concerns         Walk 10 feet on uneven surface  activity   Assist Walk 10 feet on uneven surfaces activity did not occur: Safety/medical concerns         Wheelchair     Assist Will patient use wheelchair at discharge?: Yes Type of Wheelchair: Manual    Wheelchair assist level: Dependent - Patient 0% Max wheelchair distance: 150 ft    Wheelchair 50 feet with 2 turns activity    Assist        Assist Level: Dependent - Patient 0%   Wheelchair 150 feet activity     Assist      Assist Level: Dependent - Patient 0%  Blood pressure 102/61, pulse (!) 103, temperature 98.4 F (36.9 C), temperature source Oral, resp. rate 18, height 5\' 8"  (1.727 m), weight 55.4 kg, SpO2 96 %.  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 Antithrombotics: -DVT/anticoagulation:SCDs -antiplatelet therapy: Aspirin 81 mg daily and Plavix 75 mg daily x3 weeks then aspirin alone 3. Pain Management:Tylenol as needed 4. Mood:Xanax 1 mg nightly -antipsychotic agents: N/A 5. Neuropsych: This patientiscapable of making decisions on herown behalf. 6. Skin/Wound Care:Routine skin checks -local skin care for  abrasions -nutrition discussed  -benadryl prn for itching  -sarna lotion prn 7. Fluids/Electrolytes/Nutrition:Routine and outs 8.  ESRD. Hemodialysis per renal services. HD after therapy day to allow for better therapy participation 9. Orthostasis. Continue ProAmatine 10 mg Monday Wednesday Friday  Blood pressure soft, but improved on 12/20 10. Hyperlipidemia. Crestor 11. Acute on chronic combined congestive heart failure. Monitor for any signs of fluid overload. Daily weights  TEE suggesting EF of 35% with mild MR, TR, AS Filed Weights   08/02/19 1822 08/03/19 0500 08/04/19 0517  Weight: 50.5 kg 53.4 kg 55.4 kg    Weights trending up on 12/20 12. Diabetes mellitus type II with hyperglycemia, hemoglobin A1c 5.3.   Continue SSI  Extremely labile on 12/20 13. History of tobacco abuse. Counseling 14. CAD with CABG 2010. Continue aspirin. No chest pain reported. 15. IBS/history of GI bleed. hold Amitiza24 mcg daily - due to diarrhea  16.  Social son threatened to break through security to visit his mom  69.  Chronic cystitis  Seen by urology recommending Rocephin 2 g daily 18.  Diarrhea- C diff neg, likely Amitiza took this every other day at home to avoid diarrhea will hold until stools are reduced    Continue rectal tube  Will monitor closely with antibiotics 19.  Vaginal d/c ? Liquid stool vs infected urine: ?GYN eval 20. ?Increase lethargy  Repeat CBG ordered   Xanax DC'd  Repeat labs ordered, may need to discuss with nephro based on results and consider IVF given for p.o. intake  Patient oliguric-discussed bladder scans and I/O cath with nursing if necessary   Chest x-ray ordered  LOS: 8 days A FACE TO FACE EVALUATION WAS PERFORMED  Lavaughn Bisig Lorie Phenix 08/04/2019, 9:07 AM

## 2019-08-05 ENCOUNTER — Other Ambulatory Visit: Payer: Self-pay | Admitting: Obstetrics and Gynecology

## 2019-08-05 ENCOUNTER — Inpatient Hospital Stay (HOSPITAL_COMMUNITY): Payer: Medicare HMO | Admitting: Speech Pathology

## 2019-08-05 ENCOUNTER — Inpatient Hospital Stay (HOSPITAL_COMMUNITY): Payer: Medicare HMO | Admitting: Occupational Therapy

## 2019-08-05 ENCOUNTER — Inpatient Hospital Stay (HOSPITAL_COMMUNITY): Payer: Medicare HMO

## 2019-08-05 DIAGNOSIS — N898 Other specified noninflammatory disorders of vagina: Secondary | ICD-10-CM

## 2019-08-05 DIAGNOSIS — N390 Urinary tract infection, site not specified: Secondary | ICD-10-CM

## 2019-08-05 DIAGNOSIS — A4150 Gram-negative sepsis, unspecified: Secondary | ICD-10-CM

## 2019-08-05 LAB — GLUCOSE, CAPILLARY
Glucose-Capillary: 127 mg/dL — ABNORMAL HIGH (ref 70–99)
Glucose-Capillary: 251 mg/dL — ABNORMAL HIGH (ref 70–99)
Glucose-Capillary: 96 mg/dL (ref 70–99)
Glucose-Capillary: 149 mg/dL — ABNORMAL HIGH (ref 70–99)

## 2019-08-05 LAB — CBC WITH DIFFERENTIAL/PLATELET
Abs Immature Granulocytes: 0.08 10*3/uL — ABNORMAL HIGH (ref 0.00–0.07)
Basophils Absolute: 0 10*3/uL (ref 0.0–0.1)
Basophils Relative: 0 %
Eosinophils Absolute: 0.1 10*3/uL (ref 0.0–0.5)
Eosinophils Relative: 1 %
HCT: 28.2 % — ABNORMAL LOW (ref 36.0–46.0)
Hemoglobin: 9.3 g/dL — ABNORMAL LOW (ref 12.0–15.0)
Immature Granulocytes: 1 %
Lymphocytes Relative: 10 %
Lymphs Abs: 1.3 10*3/uL (ref 0.7–4.0)
MCH: 34.7 pg — ABNORMAL HIGH (ref 26.0–34.0)
MCHC: 33 g/dL (ref 30.0–36.0)
MCV: 105.2 fL — ABNORMAL HIGH (ref 80.0–100.0)
Monocytes Absolute: 1.7 10*3/uL — ABNORMAL HIGH (ref 0.1–1.0)
Monocytes Relative: 14 %
Neutro Abs: 9.2 10*3/uL — ABNORMAL HIGH (ref 1.7–7.7)
Neutrophils Relative %: 74 %
Platelets: 141 10*3/uL — ABNORMAL LOW (ref 150–400)
RBC: 2.68 MIL/uL — ABNORMAL LOW (ref 3.87–5.11)
RDW: 16.6 % — ABNORMAL HIGH (ref 11.5–15.5)
WBC: 12.4 10*3/uL — ABNORMAL HIGH (ref 4.0–10.5)
nRBC: 0 % (ref 0.0–0.2)

## 2019-08-05 LAB — LACTIC ACID, PLASMA: Lactic Acid, Venous: 0.5 mmol/L (ref 0.5–1.9)

## 2019-08-05 MED ORDER — SODIUM CHLORIDE 0.9 % IV BOLUS
500.0000 mL | Freq: Once | INTRAVENOUS | Status: AC
  Administered 2019-08-05: 500 mL via INTRAVENOUS

## 2019-08-05 MED ORDER — SODIUM CHLORIDE 0.9 % IV SOLN: 100.0000 mL | INTRAVENOUS | Status: DC | PRN

## 2019-08-05 MED ORDER — LIDOCAINE-PRILOCAINE 2.5-2.5 % EX CREA: 1.0000 "application " | TOPICAL_CREAM | CUTANEOUS | Status: DC | PRN

## 2019-08-05 MED ORDER — PENTAFLUOROPROP-TETRAFLUOROETH EX AERO: 1.0000 "application " | INHALATION_SPRAY | CUTANEOUS | Status: DC | PRN

## 2019-08-05 MED ORDER — MICONAZOLE NITRATE POWD: Freq: Two times a day (BID) | Status: DC

## 2019-08-05 MED ORDER — ALTEPLASE 2 MG IJ SOLR: 2.0000 mg | Freq: Once | INTRAMUSCULAR | Status: DC | PRN

## 2019-08-05 MED ORDER — SACCHAROMYCES BOULARDII 250 MG PO CAPS
250.0000 mg | ORAL_CAPSULE | Freq: Two times a day (BID) | ORAL | Status: DC
  Administered 2019-08-05 – 2019-08-24 (×38): 250 mg via ORAL
  Filled 2019-08-05 (×38): qty 1

## 2019-08-05 MED ORDER — BOOST / RESOURCE BREEZE PO LIQD CUSTOM
1.0000 | Freq: Three times a day (TID) | ORAL | Status: DC
  Administered 2019-08-05 – 2019-08-12 (×12): 1 via ORAL

## 2019-08-05 MED ORDER — VANCOMYCIN HCL IN DEXTROSE 500-5 MG/100ML-% IV SOLN
INTRAVENOUS | Status: AC
  Administered 2019-08-05: 500 mg
  Filled 2019-08-05: qty 100

## 2019-08-05 MED ORDER — HEPARIN SODIUM (PORCINE) 1000 UNIT/ML DIALYSIS: 1000.0000 [IU] | INTRAMUSCULAR | Status: DC | PRN

## 2019-08-05 MED ORDER — NEPRO/CARBSTEADY PO LIQD
237.0000 mL | Freq: Three times a day (TID) | ORAL | Status: DC
  Administered 2019-08-05 – 2019-08-23 (×36): 237 mL via ORAL
  Filled 2019-08-05 (×2): qty 237

## 2019-08-05 MED ORDER — LIDOCAINE HCL (PF) 1 % IJ SOLN: 5.0000 mL | INTRAMUSCULAR | Status: DC | PRN

## 2019-08-05 MED ORDER — HEPARIN SODIUM (PORCINE) 1000 UNIT/ML IJ SOLN
INTRAMUSCULAR | Status: AC
  Administered 2019-08-05: 3800 [IU]
  Filled 2019-08-05: qty 4

## 2019-08-05 NOTE — Plan of Care (Signed)
  Problem: Consults Goal: RH STROKE PATIENT EDUCATION Description: See Patient Education module for education specifics  Outcome: Progressing Goal: Nutrition Consult-if indicated Outcome: Progressing Goal: Diabetes Guidelines if Diabetic/Glucose > 140 Description: If diabetic or lab glucose is > 140 mg/dl - Initiate Diabetes/Hyperglycemia Guidelines & Document Interventions  Outcome: Progressing   Problem: RH BOWEL ELIMINATION Goal: RH STG MANAGE BOWEL WITH ASSISTANCE Description: STG Manage Bowel with mod Assistance. Outcome: Progressing Goal: RH STG MANAGE BOWEL W/MEDICATION W/ASSISTANCE Description: STG Manage Bowel with Medication with mod I/supervision Assistance. Outcome: Progressing   Problem: RH BLADDER ELIMINATION Goal: RH STG MANAGE BLADDER WITH ASSISTANCE Description: STG Manage Bladder With mod Assistance Outcome: Progressing   Problem: RH SKIN INTEGRITY Goal: RH STG SKIN FREE OF INFECTION/BREAKDOWN Description: Pt has stage 2 to buttocks. Will be staged 1 or improved prior to DC with mod assist Outcome: Progressing Goal: RH STG MAINTAIN SKIN INTEGRITY WITH ASSISTANCE Description: STG Maintain Skin Integrity With mod Assistance. Outcome: Progressing   Problem: RH SAFETY Goal: RH STG ADHERE TO SAFETY PRECAUTIONS W/ASSISTANCE/DEVICE Description: STG Adhere to Safety Precautions With mod Assistance/Device. Outcome: Progressing Goal: RH STG DECREASED RISK OF FALL WITH ASSISTANCE Description: STG Decreased Risk of Fall With reminders/cues Assistance. Outcome: Progressing   Problem: RH COGNITION-NURSING Goal: RH STG USES MEMORY AIDS/STRATEGIES W/ASSIST TO PROBLEM SOLVE Description: STG Uses Memory Aids/Strategies With min Assistance to Problem Solve. Outcome: Progressing Goal: RH STG ANTICIPATES NEEDS/CALLS FOR ASSIST W/ASSIST/CUES Description: STG Anticipates Needs/Calls for Assist With min Assistance/Cues. Outcome: Progressing   Problem: RH PAIN  MANAGEMENT Goal: RH STG PAIN MANAGED AT OR BELOW PT'S PAIN GOAL Description: Less than 3 on 0-10 scale Outcome: Progressing   Problem: RH KNOWLEDGE DEFICIT Goal: RH STG INCREASE KNOWLEDGE OF DIABETES Description: Pt and husband will be able to verbalize 2 ways to manage DM with medication and diet regimen with min assist Outcome: Progressing Goal: RH STG INCREASE KNOWLEDGE OF HYPERTENSION Description: Pt and husband will be able to verbalize 2 ways to manage HTN with medication and diet regimen with min assist Outcome: Progressing Goal: RH STG INCREASE KNOWLEDGE OF STROKE PROPHYLAXIS Description: Pt and husband will be able to verbalize 2 ways to manage stroke prophylaxis with medication and diet regimen with min assist Outcome: Progressing

## 2019-08-05 NOTE — Consult Note (Signed)
WOC Nurse Consult Note: Patient receiving care in [address] [facility] [phone].  Consult completed remotely after review of record and phone conversation with primary RN, Lonzo Cloud. Reason for Consult: "sacral wound" Wound type: partial thickness skin impairment partially related to fecal incontinence of the liquid variety. Pressure Injury POA: Yes/No/NA Measurement: To be provided by the bedside RN in the flowsheet section Wound bed: red, blanches  Drainage (amount, consistency, odor) none Periwound:intact Dressing procedure/placement/frequency:  Cleanse the wound on the right buttock with soap and water. Pat dry. Place a hydrocolloid dressing Kellie Simmering [phone]) over the area. Change daily and prn. Monitor the wound area(s) for worsening of condition such as: Signs/symptoms of infection,  Increase in size,  Development of or worsening of odor, Development of pain, or increased pain at the affected locations.  Notify the medical team if any of these develop.  Thank you for the consult.  Discussed plan of care with the bedside nurse.  [place] nurse will not follow at this time.  Please re-consult the [place] team if needed.  Val Riles, RN, MSN, CWOCN, CNS-BC, pager [phone]

## 2019-08-05 NOTE — Progress Notes (Signed)
Speech Language Pathology Weekly Progress and Session Note  Patient Details  Name: [name] MRN: [id number] Date of Birth: Sep 05, 1940  Beginning of progress report period: July 28, 2019 End of progress report period: August 05, 2019  Today's Date: 08/05/2019 SLP Individual Time: 1103-1200 SLP Individual Time Calculation (min): 57 min  Short Term Goals: Week 1: SLP Short Term Goal 1 (Week 1): Pt will use external aids to recall daily information with min assist verbal cues. SLP Short Term Goal 1 - Progress (Week 1): Met SLP Short Term Goal 2 (Week 1): Pt will complete mildly complex tasks with min assist verbal cues for functional problem solving. SLP Short Term Goal 2 - Progress (Week 1): Progressing toward goal    New Short Term Goals: Week 2: SLP Short Term Goal 1 (Week 2): Pt will use external aids to recall daily information with Supervision assist verbal cues. SLP Short Term Goal 2 (Week 2): Pt will complete mildly complex tasks with min assist verbal cues for functional problem solving. SLP Short Term Goal 3 (Week 2): Pt will locate items to right of midline with Supervision A verbal/visual cues.  Weekly Progress Updates: Pt has made functional gains and met 1 out of 2 short term goals this reporting period. Pt is currently Mod assist for basic and mildly complex cognitive tasks due to impairments primarily impacting problem solving, short term memory, and right visual field disturbances. Pt has demonstrated improved recall of day to day information. Pt and family education is ongoing. Pt would continue to benefit from skilled ST while inpatient in order to maximize functional independence and reduce burden of care prior to discharge. Anticipate that pt will need 24/7 supervision at discharge in addition to [place] follow up at next level of care.     Intensity: Minumum of 1-2 x/day, 30 to 90 minutes Frequency: 3 to 5 out of 7 days Duration/Length of Stay:  08/20/19 Treatment/Interventions: Cognitive remediation/compensation;Cueing hierarchy;Functional tasks;Environmental controls;Internal/external aids;Patient/family education   Daily Session  Skilled Therapeutic Interventions: Pt was seen for skilled ST targeting cognitive goals. Pt's husband was present throughout session and receptive to education regarding need for assistance with medication and finance management upon discharge. Upon arrival pt recalled general information/course of events pertaining to recent medical events/changes over the weekend, however required Min A verbal cues to recall details. Pt reported difficulty hearing, therefore SLP assisted with placing new battery in right aid and positioning in ear. SLP further facilitated session with overall Min A for problem solving and error awareness when matching colored blocks to a 9 color pattern board. Pt demonstrated improved ability to locate items on right side of tray today in comparison to previous visits. Pt was left sitting in wheelchair with alarm set and all needs within reach. Continue per current plan of care.       Pain Pain Assessment Pain Scale: Faces Pain Score: 0-No pain Faces Pain Scale: No hurt  Therapy/Group: Individual Therapy  Arbutus Leas 08/05/2019, 9:57 AM

## 2019-08-05 NOTE — Progress Notes (Signed)
Nutrition Follow-up  RD working remotely.  DOCUMENTATION CODES:   Underweight, suspect pt with some degree of malnutrition but unable to confirm at this time without NFPE  INTERVENTION:   - Continue rena-vit daily  - Boost Breeze po TID, each supplement provides 250 kcal and 9 grams of protein  - Pro-stat 30 ml po BID, each supplement provides 100 kcal and 15 grams of protein  - Once diet advanced past clear liquids, Nepro Shake po TID, each supplement provides 425 kcal and 19 grams protein  NUTRITION DIAGNOSIS:   Increased nutrient needs related to chronic illness (ESRD on HD, CHF) as evidenced by estimated needs.  Ongoing  GOAL:   Patient will meet greater than or equal to 90% of their needs  Progressing  MONITOR:   PO intake, Supplement acceptance, Labs, Weight trends, Skin, I & O's  REASON FOR ASSESSMENT:   Other (low BMI)    ASSESSMENT:   78 year old female with PMH of HTN, HLD, DM, history of GI bleed, IBS, CHF, ESRD on HD, CAD s/p CABG 2010, aortic stenosis, history of right thalamic CVA 2018, tobacco abuse. Presented 07/23/19 with right-sided weakness. Chronic small vessel ischemia and generalized volume loss noted. CT angiogram negative for large vessel occlusion however positive for significant intracranial and extracranial atherosclerotic disease. MRI of the brain revealing areas of acute infarction throughout the distal left anterior cerebral artery territory. Pt admitted to CIR on 12/12.  12/18 - s/p TEE 12/20 - KUB suggestive of ileus, clear liquid diet  Noted target d/c date of 1/05.  Pt accepting oral nutrition supplements per [facility] documentation.  Pt due for HD today. Noted most recent post-HD weight was below EDW.  Last HD on 12/18 with 0 ml net UF. Post-HD weight 50.5 kg.  EDW: 51.5 kg  Overall, weight up 2 lbs since admit to CIR. Will continue to monitor trends.  Pt is on a clear liquid diet at this time due to possible ileus. Discussed  with MD. Pt remain on clear liquids today and plan is to re-evaluate tomorrow AM. RD will order Boost Breeze while pt on clear liquids.  Meal Completion: 0-85% x last 8 recorded meals (renal/carb modified until clear liquids on 12/20)  Medications reviewed and include: Nepro BID, Pro-stat BID, folic acid, SSI, Reglan 5 mg TID, rena-vit, Renvela, vitamin B-12, IV abx  Labs reviewed: sodium 130, chloride 94, hemoglobin 9.3 CBG's: 125-214 x 24 hours  UOP: 480 ml x 24 hours  NUTRITION - FOCUSED PHYSICAL EXAM:  Unable to complete at this time. RD working remotely.  Diet Order:   Diet Order            Diet clear liquid Room service appropriate? Yes; Fluid consistency: Thin  Diet effective now              EDUCATION NEEDS:   No education needs have been identified at this time  Skin:  Skin Assessment: Skin Integrity Issues: Stage II: right buttocks  Last BM:  08/05/19 type 7 via rectal tube  Height:   Ht Readings from Last 1 Encounters:  07/27/19 5\' 8"  (1.727 m)    Weight:   Wt Readings from Last 1 Encounters:  08/05/19 53.9 kg    Ideal Body Weight:  63.6 kg  BMI:  Body mass index is 18.07 kg/m.  Estimated Nutritional Needs:   Kcal:  1450-1650  Protein:  70-85 grams  Fluid:  UOP + 1000 ml    Gaynell Face, MS, RD, LDN Inpatient  Clinical Dietitian Pager: [phone] Weekend/After Hours: [phone]

## 2019-08-05 NOTE — Progress Notes (Signed)
Physical Therapy Weekly Progress Note  Patient Details  Name: [name] MRN: [id number] Date of Birth: October 19, 1940  Beginning of progress report period: July 28, 2019 End of progress report period: August 05, 2019  Today's Date: 08/05/2019 PT Individual Time: 8502-7741 PT Individual Time Calculation (min): 44 min    Patient has met 2 of 3 short term goals. Pt is making some progress with static sitting balance and is able to tolerate increased standing tolerance with use of standing frame. Pt continues to be limited by poor muscular endurance, fatigue, hemiparesis and poor postural control, still requiring total assist +2 for slideboard transfers.   Patient continues to demonstrate the following deficits muscle weakness, impaired timing and sequencing and decreased coordination, decreased initiation and decreased attention and decreased sitting balance, decreased postural control, hemiplegia and decreased balance strategies and therefore will continue to benefit from skilled PT intervention to increase functional independence with mobility.  Patient progressing toward long term goals..  Continue plan of care.  PT Short Term Goals Week 1:  PT Short Term Goal 1 (Week 1): Pt will be able to demonstrate functional dynamic sitting balance wit mod assist PT Short Term Goal 1 - Progress (Week 1): Met PT Short Term Goal 2 (Week 1): Pt will be able to perform slideboard transfer with mod assist +2 PT Short Term Goal 2 - Progress (Week 1): Not met PT Short Term Goal 3 (Week 1): Pt will be able to tolerate standing with lift equipment x 5 min for weightbearing/postural control retraining PT Short Term Goal 3 - Progress (Week 1): Met Week 2:  PT Short Term Goal 1 (Week 2): Pt will perform slideboard transfer with max assist +2 PT Short Term Goal 2 (Week 2): Pt will maintain static sitting balance with CGA PT Short Term Goal 3 (Week 2): Pt will use standing lift equipment for LE weightbearing  x 8 minutes  Skilled Therapeutic Interventions/Progress Updates:  Ambulation/gait training;Balance/vestibular training;Cognitive remediation/compensation;Community reintegration;Discharge planning;Disease management/prevention;DME/adaptive equipment instruction;Functional mobility training;Functional electrical stimulation;Neuromuscular re-education;Pain management;Patient/family education;Psychosocial support;Skin care/wound management;Splinting/orthotics;Stair training;Therapeutic Activities;Therapeutic Exercise;UE/LE Strength taining/ROM;UE/LE Coordination activities;Visual/perceptual remediation/compensation;Wheelchair propulsion/positioning   Pt supine in bed upon PT arrival, agreeable to therapy tx and reports pain in her bottom 5/10. Pt performed rolling in each direction with max assist towards the R and total assist towards the L in order to don pants. Provided education regarding pressure relief, continue having staff help patient to roll on either side every 2 hours, and when sitting up in TIS w/c have staff come in every 30 min to provide pressure relief by tilting all the way back. Pt transferred to sitting EOB with total assist, cues for techniques. Once sitting EOB pt worked on static sitting balance x 5 minutes with min-mod assist with therapist providing verbal/tactile cues for weightshifting and midline orientation, facilitation for increased head and trunk extension. With pt sitting EOB therapist performed thoracic extension stretching and pec stretching this session x 2 minutes. Pt performed slideboard transfer to the TIS w/c with total assist +2. Pt transported to the ortho gym. Pt used standing frame this session x 5 minutes working on LE weightbearing and postural control, with pt in standing frame therapist behind pt to provide increased facilitation for hip extension, trunk extension and cervical/head extension as needed. While in standing frame pt able to perform x 3 overhead reaches  with L UE and active assist working on upright posture. Pt transported back to room at end of session and left in TIS w/c with  needs in reach and chair alarm set.   Therapy Documentation Precautions:  Precautions Precautions: Fall Precaution Comments: mild Rt inattention; R hemiplegia; flexi-seal fecal management Restrictions Weight Bearing Restrictions: No   Therapy/Group: Individual Therapy  Netta Corrigan, PT, DPT, CSRS 08/05/2019, 7:58 AM

## 2019-08-05 NOTE — Progress Notes (Signed)
Occupational Therapy Session Note  Patient Details  Name: [name] MRN: [id number] Date of Birth: 1940-11-13  Today's Date: 08/05/2019 OT Individual Time: 0122-2411 OT Individual Time Calculation (min): 30 min    Short Term Goals: Week 1:  OT Short Term Goal 1 (Week 1): Pt will maintain sitting balance with min assist to prepare for ADL tasks OT Short Term Goal 2 (Week 1): Pt will complete UB bathing with min assist OT Short Term Goal 3 (Week 1): Pt will complete LB bathing with max assist of 1 caregiver OT Short Term Goal 4 (Week 1): Pt will complete toilet transfer with max assist of one caregiver  Skilled Therapeutic Interventions/Progress Updates:    Pt received in w/c with spouse in the room.  Pt stated she was feeling much better. From w/c worked on sitting balance focusing on maintaining midline.  PROM with scapular glides and distal RUE.  Pt has some tightness in wrist flexors, passive stretching for extension.  Pt worked on [employer] with table top slides of therapist guiding arm through various ROM patterns. Slight tone increase noted in finger flexion and thumb.   Hand over hand activity of using R hand to grasp and release cup and reaching forward to grasp and then bring to her mouth for a sip.  No active movement in arm but pt tolerated activity well and participated.  Pt resting in wc with belt alarm on, arm supported and all needs met.   Therapy Documentation Precautions:  Precautions Precautions: Fall Precaution Comments: mild Rt inattention; R hemiplegia; flexi-seal fecal management Restrictions Weight Bearing Restrictions: No  Pain: Pain Assessment Pain Score: 0-No pain     Therapy/Group: Individual Therapy  Stafford Courthouse 08/05/2019, 12:08 PM
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
## 2019-08-05 NOTE — Progress Notes (Signed)
Occupational Therapy Weekly Progress Note  Patient Details  Name: [name] MRN: [id number] Date of Birth: 06-24-1941  Beginning of progress report period: July 28, 2019 End of progress report period: August 05, 2019     Patient has met 0 of 4 short term goals.  Pt is making slow progress and continues to require max to total A with self care and +2 A with all mobility skills. She continues to have dense R hemiplegia and R inattention along with poor activity tolerance which impacts her ability to progress at a faster rate.   Patient continues to demonstrate the following deficits: muscle weakness and muscle joint tightness, decreased cardiorespiratoy endurance, abnormal tone and unbalanced muscle activation, decreased visual perceptual skills and decreased visual motor skills, decreased attention to right, decreased problem solving, decreased memory and delayed processing and decreased sitting balance, decreased standing balance, decreased postural control, hemiplegia and decreased balance strategies and therefore will continue to benefit from skilled OT intervention to enhance overall performance with BADL and Reduce care partner burden.  Patient not progressing toward long term goals.  See goal revision..  Plan of care revisions: LTGs are set at min A but will need to be downgraded to max A of 1.  A team conference will be held for pt in a few days. LTGs will be adjusted based on her ELOS and planned d/c date. .  OT Short Term Goals Week 1:  OT Short Term Goal 1 (Week 1): Pt will maintain sitting balance with min assist to prepare for ADL tasks OT Short Term Goal 1 - Progress (Week 1): Not met OT Short Term Goal 2 (Week 1): Pt will complete UB bathing with min assist OT Short Term Goal 2 - Progress (Week 1): Progressing toward goal OT Short Term Goal 3 (Week 1): Pt will complete LB bathing with max assist of 1 caregiver OT Short Term Goal 3 - Progress (Week 1): Not met OT Short  Term Goal 4 (Week 1): Pt will complete toilet transfer with max assist of one caregiver OT Short Term Goal 4 - Progress (Week 1): Not met Week 2:  OT Short Term Goal 1 (Week 2): Pt will be able to sit EOB with mod A for balance to prepare for her ability to sit unsupported on a toilet. OT Short Term Goal 2 (Week 2): Pt will be able to use a slide board with mod A of 1 bed to w/c to prepare for her ability to do toilet transfers. OT Short Term Goal 3 (Week 2): Pt will demonstrate improved awareness of RUE with self ROM with min A and min cues.       Therapy Documentation Precautions:  Precautions Precautions: Fall Precaution Comments: mild Rt inattention; R hemiplegia; flexi-seal fecal management Restrictions Weight Bearing Restrictions: No  Pain: Pain Assessment Pain Score: 0-No pain ADL: ADL Eating: Set up Where Assessed-Eating: Chair Upper Body Bathing: Moderate assistance Where Assessed-Upper Body Bathing: Edge of bed Lower Body Bathing: Dependent Where Assessed-Lower Body Bathing: Bed level Upper Body Dressing: Maximal assistance Where Assessed-Upper Body Dressing: Edge of bed Lower Body Dressing: Dependent(+2) Where Assessed-Lower Body Dressing: Edge of bed Toilet Transfer: Maximal assistance(+2) Toilet Transfer Method: Stand pivot Toilet Transfer Equipment: Drop arm bedside commode  [place] 08/05/2019, 12:15 PM

## 2019-08-05 NOTE — Progress Notes (Signed)
[facility] ROUNDING NOTE   Subjective:   Is a 78 year old lady with history of hypertension hyperlipidemia diabetes history of GI bleed diastolic heart failure end-stage renal disease hemodialysis Monday Wednesday Friday.  She has a history of coronary artery disease status post CABG 2010.  She has aortic stenosis and history of a right thalamic CVA in 2018 and tobacco abuse.  She presented 07/23/2019 with right-sided weakness cranial CT showed no evidence of intracranial hemorrhage chronic small vessel ischemia and generalized volume loss.  Patient did not receive CTPA.  CT angiogram is negative for large vessel occlusion positive for significant cranial extracranial atherosclerotic disease.  MRI of the brain of showed areas of acute infarction throughout the distal left anterior cerebral artery territory.  Echocardiogram showed ejection fraction 30% without emboli however did show cath in the right atrium with an oscillating density considered to be vegetations versus thrombus.  TEE was suggesting 35% ejection fraction mild mitral regurg tricuspid regurg and aortic stenosis.  Blood pressure 80/39 pulse 77 temperature 97.5   Sodium 130 potassium 3.5 chloride 94 CO2 21 BUN 80 creatinine 5.36 glucose 155 calcium 8.7 phosphorus 3.1 albumin 2.2  WBC 12.2 hemoglobin 11.3 platelets 155  Aspirin 81 mg daily, X Plavix 75 mg daily, folic acid 1 mg daily, Reglan 5 mg 3 times daily, sliding scale insulin, Crestor 20 mg daily Renvela 1.6 g with meals  Objective:  Vital signs in last 24 hours:  Temp:  [97.5 F (36.4 C)-102.9 F (39.4 C)] 97.5 F (36.4 C) (12/21 1300) Pulse Rate:  [71-118] 77 (12/21 1323) Resp:  [18-24] 18 (12/21 1323) BP: (80-115)/(39-58) 80/39 (12/21 1323) SpO2:  [93 %-99 %] 93 % (12/21 0353) Weight:  [53.9 kg-54.4 kg] 54.4 kg (12/21 1300)  Weight change: -1.5 kg Filed Weights   08/04/19 0517 08/05/19 0353 08/05/19 1300  Weight: 55.4 kg 53.9 kg 54.4 kg     Intake/Output: I/O last 3 completed shifts: In: 658.5 [P.O.:360; IV Piggyback:298.5] Out: 480 [Urine:480]   Intake/Output this shift:  No intake/output data recorded.  General:NAD, chronically ill appearing female, sitting up in bed Heart:RRR Lungs:CTAB Abdomen:soft, NTND Extremities:no LE edema Dialysis Access: R Ij [facility]    Basic Metabolic Panel: Recent Labs  Lab 07/31/19 1451 08/02/19 1510 08/04/19 1025  NA 130* 128* 130*  K 4.9 3.9 3.5  CL 91* 89* 94*  CO2 24 24 21*  GLUCOSE 383* 273* 155*  BUN 92* 86* 80*  CREATININE 6.82* 5.88* 5.36*  CALCIUM 8.7* 8.3* 8.7*  PHOS 3.2 3.1  --     Liver Function Tests: Recent Labs  Lab 07/31/19 1451 08/02/19 1510 08/04/19 1025  AST  --   --  22  ALT  --   --  17  ALKPHOS  --   --  77  BILITOT  --   --  0.5  PROT  --   --  5.9*  ALBUMIN 2.6* 2.2* 2.2*   No results for input(s): LIPASE, AMYLASE in the last 168 hours. No results for input(s): AMMONIA in the last 168 hours.  CBC: Recent Labs  Lab 07/31/19 1451 08/01/19 0637 08/02/19 1510 08/04/19 1025 08/05/19 0203  WBC 12.2* 11.1* 9.4 11.9* 12.4*  NEUTROABS  --  7.7  --   --  9.2*  HGB 11.3* 11.4* 9.8* 9.8* 9.3*  HCT 36.5 35.9* 31.0* 30.1* 28.2*  MCV 109.6* 107.5* 107.6* 104.5* 105.2*  PLT 155 153 148* 157 141*    Cardiac Enzymes: No results for input(s): CKTOTAL, CKMB,  CKMBINDEX, TROPONINI in the last 168 hours.  BNP: Invalid input(s): POCBNP  CBG: Recent Labs  Lab 08/04/19 1137 08/04/19 1643 08/04/19 2106 08/05/19 0613 08/05/19 1145  GLUCAP 125* 197* 214* 127* 251*    Microbiology: Results for orders placed or performed during the hospital encounter of 07/27/19  Culture, Urine     Status: Abnormal   Collection Time: 07/31/19 10:05 PM   Specimen: Urine, Clean Catch  Result Value Ref Range Status   Specimen Description URINE, CLEAN CATCH  Final   Special Requests   Final    Normal Performed at [facility], [place] [address]  [place], [place] [address]    Culture MULTIPLE SPECIES PRESENT, SUGGEST RECOLLECTION (A)  Final   Report Status 08/01/2019 FINAL  Final  C difficile quick scan w PCR reflex     Status: None   Collection Time: 07/31/19 10:05 PM   Specimen: Urine, Clean Catch; Stool  Result Value Ref Range Status   C Diff antigen NEGATIVE NEGATIVE Final   C Diff toxin NEGATIVE NEGATIVE Final   C Diff interpretation No C. difficile detected.  Final    Comment: Performed at [facility], [place] [address], [place], [place] [address]  Culture, blood (routine x 2)     Status: None (Preliminary result)   Collection Time: 08/04/19 10:43 PM   Specimen: BLOOD  Result Value Ref Range Status   Specimen Description BLOOD LEFT HAND  Final   Special Requests   Final    BOTTLES DRAWN AEROBIC AND ANAEROBIC Blood Culture results may not be optimal due to an inadequate volume of blood received in culture bottles   Culture   Final    NO GROWTH < 12 HOURS Performed at [facility], [place] [address], [place], [place] [address]    Report Status PENDING  Incomplete  Culture, blood (routine x 2)     Status: None (Preliminary result)   Collection Time: 08/04/19 10:46 PM   Specimen: BLOOD  Result Value Ref Range Status   Specimen Description BLOOD LEFT HAND  Final   Special Requests   Final    BOTTLES DRAWN AEROBIC AND ANAEROBIC Blood Culture adequate volume   Culture   Final    NO GROWTH < 12 HOURS Performed at [facility], [place] [address], [place], [place] [address]    Report Status PENDING  Incomplete    Coagulation Studies: No results for input(s): LABPROT, INR in the last 72 hours.  Urinalysis: Recent Labs    08/04/19 1117  COLORURINE BROWN*  LABSPEC TEST NOT REPORTED DUE TO COLOR INTERFERENCE OF URINE PIGMENT  PHURINE TEST NOT REPORTED DUE TO COLOR INTERFERENCE OF URINE PIGMENT  GLUCOSEU TEST NOT REPORTED DUE TO COLOR INTERFERENCE OF URINE PIGMENT*  HGBUR TEST NOT REPORTED DUE TO COLOR INTERFERENCE  OF URINE PIGMENT*  BILIRUBINUR TEST NOT REPORTED DUE TO COLOR INTERFERENCE OF URINE PIGMENT*  KETONESUR TEST NOT REPORTED DUE TO COLOR INTERFERENCE OF URINE PIGMENT*  PROTEINUR TEST NOT REPORTED DUE TO COLOR INTERFERENCE OF URINE PIGMENT*  NITRITE TEST NOT REPORTED DUE TO COLOR INTERFERENCE OF URINE PIGMENT*  LEUKOCYTESUR TEST NOT REPORTED DUE TO COLOR INTERFERENCE OF URINE PIGMENT*      Imaging: DG Chest 2 View  Result Date: 08/04/2019 CLINICAL DATA:  Patient is lethargic. EXAM: CHEST - 2 VIEW COMPARISON:  None. FINDINGS: Stable cardiomegaly. The hila and mediastinum are unchanged. Vascular calcifications are noted. A double lumen central line terminates in the right  side of the atrium, stable. No nodule, mass, infiltrate, or overt edema. IMPRESSION: No active cardiopulmonary disease. Stable double lumen central line terminating in the right side of the atrium. Electronically Signed   By: Dorise Bullion III M.D   On: 08/04/2019 12:08   DG Abd 1 View  Result Date: 08/05/2019 CLINICAL DATA:  Ileus. EXAM: ABDOMEN - 1 VIEW COMPARISON:  08/04/2019.  CT 08/01/2019. FINDINGS: Persistent distention of small and large bowel loops, improved from prior exam. Findings consistent improving adynamic ileus. Pelvic calcifications most consistent with phleboliths. Aortoiliac and visceral atherosclerotic vascular calcification. Degenerative change lumbar spine and both hips. IMPRESSION: Persistent distention of small large bowel loops, improved from prior exam. Findings consistent with improving adynamic ileus. Electronically Signed   By: Marcello Moores  Register   On: 08/05/2019 06:19   DG Abd 1 View  Result Date: 08/04/2019 CLINICAL DATA:  Abdominal distension. EXAM: ABDOMEN - 1 VIEW COMPARISON:  August 01, 2019 FINDINGS: Mild prominence of large and small bowel suggest the possibility of developing ileus. Moderate fecal loading in the left colon. Evaluation for free air is limited on supine imaging but none is  seen. Sign IMPRESSION: Air-filled prominent loops of large and small bowel suggests developing ileus. Recommend attention on follow-up. Moderate fecal loading. No other acute abnormalities. Electronically Signed   By: Dorise Bullion III M.D   On: 08/04/2019 13:41     Medications:   . sodium chloride    . sodium chloride    . piperacillin-tazobactam (ZOSYN)  IV 2.25 g (08/05/19 0640)  . vancomycin     . aspirin EC  81 mg Oral Daily  . Chlorhexidine Gluconate Cloth  6 each Topical Q0600  . clopidogrel  75 mg Oral Daily  . feeding supplement  1 Container Oral TID BM  . feeding supplement (NEPRO CARB STEADY)  237 mL Oral TID BM  . feeding supplement (PRO-STAT SUGAR FREE 64)  30 mL Oral BID  . folic acid  1 mg Oral Daily  . Gerhardt's butt cream   Topical QID  . insulin aspart  0-9 Units Subcutaneous TID WC  . metoCLOPramide  5 mg Oral TID AC  . miconazole nitrate   Topical BID  . midodrine  10 mg Oral Q M,W,F-HD  . multivitamin  1 tablet Oral QHS  . rosuvastatin  20 mg Oral q1800  . saccharomyces boulardii  250 mg Oral BID  . sevelamer carbonate  1,600 mg Oral TID WC  . vitamin B-12  500 mcg Oral Daily   sodium chloride, sodium chloride, acetaminophen **OR** acetaminophen (TYLENOL) oral liquid 160 mg/5 mL **OR** acetaminophen, camphor-menthol, diphenhydrAMINE, ondansetron, sorbitol  Assessment/ Plan:  1. 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 Adm to CIR 12/12 2. ESRD:Continue HD per MWF.Resume tight heparin Dr. Jonnie Finner spoke w/ Dr Posey Pronto , cath placed CK Vasc in September 2020.  3. Hypertension/volume:Permissive HTNinitially, now hypotensive -does not appear to have excess volume, titrate down as tolerated. Midodrine with HD.  4. Anemia:Hgb11.4>9.8,stable - no ESA needed, if Hgb  remains <10, will start ESA. 5. Metabolic bone disease:Ca/Phos to goal. Continue home Sevelamer as binder.-un beknownst to her dialysis unit, she tells me she was also taking calcitriol at home - iPTH in goal - will d/c calcitriol and switch to TIW hectorol -home calcitriol should not be resumed at d/c 6. T2DM 7. CAD (Hx CABG)/EF 20 -  25% mod - severe AS; catheter in right atrium with oscillating density (veg vs thrombus) per 12/8 ECHO -  8. Nutrition - high risk for weight loss - intakeimproving-continue Nepro 9. Deconditioning/right hemiparesis - CIR adm 10. Dizziness with movement - for vestibular work up    LOS: [place] @TODAY @1 :59 PM

## 2019-08-05 NOTE — Progress Notes (Signed)
Patients vitals were as follows, T-102.9, HR-118, BP-80/53, RR-24, SpO2-97% on RA. At the time patient was not alert and hard to awaken from sleep. Rapid response was called. 500cc NS bolus x1 was ordered. MD on call Delice Lesch was notified. Blood cultures were ordered. Tylenol suppository was given for temp. Will continue to monitor patient. Call bell in reach.

## 2019-08-05 NOTE — Consult Note (Signed)
Medical Consultation   [name]  [id number]  DOB: December 11, 1940  DOA: 07/27/2019  PCP: [name], MD   Outpatient Specialists: Cardiology   Requesting physician: Dr. Letta Pate  Reason for consultation: Sepsis   History of Present Illness: [name] is an 78 y.o. female in [place] unit, with PMH of hypertension hyperlipidemia diabetes history of GI bleed diastolic heart failure end-stage renal disease hemodialysis Monday Wednesday Friday.  She has a history of coronary artery disease status post CABG 2010.  She has aortic stenosis and history of a right thalamic CVA in 2018 and tobacco abuse.  She presented 07/23/2019 with right-sided weakness cranial CT showed no evidence of intracranial hemorrhage chronic small vessel ischemia and generalized volume loss.  Patient did not receive CTPA.  MRI showed ACA infact. Echocardiogram showed ejection fraction 30% without emboli however did show cath in the right atrium with an oscillating density considered to be vegetations versus thrombus.  TEE was suggesting 35% ejection fraction mild mitral regurg tricuspid regurg and aortic stenosis.  And was discharged to rehab unit after medically hospitalized.  Pt has had diarrhea since last week, had a rectal tube which dislodged last week and C. Diff was tested negative on 12/16. Pt denied any cough, no chest pain or abd pain, no dysuria (Reportedly pt just completed UTI Rx  Two days ago in [place]).  Nurse reported that pt has had greenish vaginal discharge since yesterday, on and off, foul smelling?  Also, pt has had ileus in the last few day followed by primary team, repeated X-ray today showing improving signs, pt has been on liquid diet for ileus, this morning she had a loose BM. No fever this morning. Review of Systems:  ROS As per HPI otherwise 10 point review of systems negative.     Past Medical History: Past Medical History:  Diagnosis Date  . Anemia    Pt is  taking iron.   [name] Anxiety   . Arthritis   . Carotid stenosis    40-59% bilateral ICA stenosis in 2/12.  . Chronic low back pain   . CKD (chronic kidney disease)    Dr. Audie Clear at [facility]  . Coronary artery disease    Pt presented 2/10 to [facility] with NSTEMI and diastolic CHF exacerbation.  LHC was done  3/10 showing 99% pRCA stenosis and 80% calcified pLAD stenosis with L=>R collaterals.  Pt was referred  for CABG which was done by Dr. Prescott Gum with LIMA-LAD, SVG-RCA, SVG-OM.  . Diabetes mellitus   . Diabetic neuropathy ([place])   . Diastolic CHF (HCC)    Echo (2/10) showed EF 55-65%, mild LVH, diastolic dysfunction, mild AS with mean gradient 12 mmHg, PASP 43 mmHg.  [place] (2/12): EF 55-60%, mild LVH, mild AS (mean gradient 12), PA systolic pressure 32 mmHg.     [name] GERD (gastroesophageal reflux disease)   . Heart murmur   . Hyperlipidemia   . Hypertension   . Mild aortic stenosis    mean gradient 12 mmHg in 2/12.  . Myocardial infarction ([place])    "mild"  . Pneumonia   . PONV (postoperative nausea and vomiting)   . Stroke [facility])    " mild"  . Thrombocytopenia ([place])   . Unsteady gait     Past Surgical History: Past Surgical History:  Procedure Laterality Date  . AORTIC ARCH ANGIOGRAPHY N/A 10/18/2018   Procedure:  AORTIC ARCH ANGIOGRAPHY;  Surgeon: Marty Heck, MD;  Location: [place] [facility];  Service: Cardiovascular;  Laterality: N/A;  . AV FISTULA PLACEMENT Left 01/30/2015   Procedure: Creation of a Radial Cephalic AV Fistula left wrist;  Surgeon: [name], MD;  Location: [place];  Service: Vascular;  Laterality: Left;  . AV FISTULA PLACEMENT Left 09/06/2018   Procedure: LEFT ARM ARTERIOVENOUS (AV) FISTULA CREATION;  Surgeon: Marty Heck, MD;  Location: [place];  Service: Vascular;  Laterality: Left;  . BACK SURGERY     multiple  . BREAST SURGERY     biopsy  . BUBBLE STUDY  08/02/2019   Procedure: BUBBLE STUDY;  Surgeon: Skeet Latch, MD;   Location: [place];  Service: Cardiovascular;;  . CARDIAC CATHETERIZATION    . CATARACT EXTRACTION W/ INTRAOCULAR LENS  IMPLANT, BILATERAL    . COLONOSCOPY Left 11/03/2016   Procedure: COLONOSCOPY;  Surgeon: [name], MD;  Location: [facility] ENDOSCOPY;  Service: Endoscopy;  Laterality: Left;  . COLONOSCOPY W/ BIOPSIES AND POLYPECTOMY    . CORONARY ARTERY BYPASS GRAFT  09/2008   pt with NSTEMI and diastolic CHF exacerbation.  LHC was done  3/10 showing 99% pRCA stenosis and 80% calcified pLAD stenosis with L=>R collaterals.  Pt was referred  for CABG which was done by Dr. Prescott Gum with LIMA-LAD, SVG-RCA, SVG-OM.  [name] ESOPHAGOGASTRODUODENOSCOPY N/A 11/02/2016   Procedure: ESOPHAGOGASTRODUODENOSCOPY (EGD);  Surgeon: [name], MD;  Location: [facility] ENDOSCOPY;  Service: Endoscopy;  Laterality: N/A;  . GIVENS CAPSULE STUDY N/A 11/29/2016   Procedure: GIVENS CAPSULE STUDY;  Surgeon: [name], MD;  Location: [place];  Service: Endoscopy;  Laterality: N/A;  . LIGATION OF ARTERIOVENOUS  FISTULA Left 09/13/2018   Procedure: LIGATION OF LEFT ARTERIOVENOUS  FISTULA;  Surgeon: Waynetta Sandy, MD;  Location: [place];  Service: Vascular;  Laterality: Left;  . REVISON OF ARTERIOVENOUS FISTULA Left 07/02/2015   Procedure: REVISON OF LEFT RADIOCEPHALIC ARTERIOVENOUS FISTULA;  Surgeon: [name], MD;  Location: [place];  Service: Vascular;  Laterality: Left;  . RIGHT/LEFT HEART CATH AND CORONARY/GRAFT ANGIOGRAPHY N/A 05/18/2018   Procedure: RIGHT/LEFT HEART CATH AND CORONARY/GRAFT ANGIOGRAPHY;  Surgeon: Leonie Man, MD;  Location: [place] [facility];  Service: Cardiovascular;  Laterality: N/A;  . TEE WITHOUT CARDIOVERSION N/A 08/02/2019   Procedure: TRANSESOPHAGEAL ECHOCARDIOGRAM (TEE);  Surgeon: Skeet Latch, MD;  Location: [place];  Service: Cardiovascular;  Laterality: N/A;  . UPPER EXTREMITY ANGIOGRAPHY Left 10/18/2018   Procedure: UPPER EXTREMITY ANGIOGRAPHY;  Surgeon: Marty Heck, MD;  Location: [place] [facility];  Service: Cardiovascular;  Laterality: Left;     Allergies:   Allergies  Allergen Reactions  . Doxycycline Nausea And Vomiting    Caused "DEATHLY NAUSEA AND VOMITING"  . Lipitor [Atorvastatin] Other (See Comments)    Stomach pain  . Strawberry Extract Rash     Social History:  reports that she has quit smoking. Her smoking use included cigarettes. She has never used smokeless tobacco. She reports current alcohol use. She reports that she does not use drugs.   Family History: Family History  Adopted: Yes  Family history unknown: Yes       Physical Exam: Vitals:   08/05/19 0353 08/05/19 1300 08/05/19 1323 08/05/19 1404  BP: (!) 115/46 (!) 94/44 (!) 80/39 (!) 85/43  Pulse: 71 79 77 80  Resp: 18 18 18 17   Temp: 97.6 F (36.4 C) (!) 97.5 F (36.4 C)    TempSrc:  Oral Oral    SpO2: 93%     Weight: 53.9 kg 54.4 kg    Height:        Constitutional: Chronic ill appearance,  Alert and awake, oriented x3, not in any acute distress. Eyes: PERLA, EOMI, irises appear normal, anicteric sclera,  ENMT: external ears and nose appear normal, normal hearing or hard of hearing            Lips appears normal, oropharynx mucosa, tongue, posterior pharynx appear normal  Neck: neck appears normal, no masses, normal ROM, no thyromegaly, no JVD  CVS: Right sided  Respiratory:  clear to auscultation bilaterally, no wheezing, rales or rhonchi. Respiratory effort normal. No accessory muscle use.  Abdomen: soft nontender, nondistended, normal bowel sounds, no hepatosplenomegaly, no hernias  Musculoskeletal: : no cyanosis, clubbing or edema noted bilaterally                        Neuro: Right sided weakness and right side neglect. Psych: judgement and insight appear normal, stable mood and affect, mental status Skin: Extensive rash on sacrum area with a small skin abrasion but no significant ulcers.   Data reviewed:  I have personally  reviewed following labs and imaging studies Labs:  CBC: Recent Labs  Lab 07/31/19 1451 08/01/19 0637 08/02/19 1510 08/04/19 1025 08/05/19 0203  WBC 12.2* 11.1* 9.4 11.9* 12.4*  NEUTROABS  --  7.7  --   --  9.2*  HGB 11.3* 11.4* 9.8* 9.8* 9.3*  HCT 36.5 35.9* 31.0* 30.1* 28.2*  MCV 109.6* 107.5* 107.6* 104.5* 105.2*  PLT 155 153 148* 157 141*    Basic Metabolic Panel: Recent Labs  Lab 07/31/19 1451 08/02/19 1510 08/04/19 1025  NA 130* 128* 130*  K 4.9 3.9 3.5  CL 91* 89* 94*  CO2 24 24 21*  GLUCOSE 383* 273* 155*  BUN 92* 86* 80*  CREATININE 6.82* 5.88* 5.36*  CALCIUM 8.7* 8.3* 8.7*  PHOS 3.2 3.1  --    GFR Estimated Creatinine Clearance: 7.5 mL/min (A) (by C-G formula based on SCr of 5.36 mg/dL (H)). Liver Function Tests: Recent Labs  Lab 07/31/19 1451 08/02/19 1510 08/04/19 1025  AST  --   --  22  ALT  --   --  17  ALKPHOS  --   --  77  BILITOT  --   --  0.5  PROT  --   --  5.9*  ALBUMIN 2.6* 2.2* 2.2*   No results for input(s): LIPASE, AMYLASE in the last 168 hours. No results for input(s): AMMONIA in the last 168 hours. Coagulation profile No results for input(s): INR, PROTIME in the last 168 hours.  Cardiac Enzymes: No results for input(s): CKTOTAL, CKMB, CKMBINDEX, TROPONINI in the last 168 hours. BNP: Invalid input(s): POCBNP CBG: Recent Labs  Lab 08/04/19 1137 08/04/19 1643 08/04/19 2106 08/05/19 0613 08/05/19 1145  GLUCAP 125* 197* 214* 127* 251*   D-Dimer No results for input(s): DDIMER in the last 72 hours. Hgb A1c No results for input(s): HGBA1C in the last 72 hours. Lipid Profile No results for input(s): CHOL, HDL, LDLCALC, TRIG, CHOLHDL, LDLDIRECT in the last 72 hours. Thyroid function studies No results for input(s): TSH, T4TOTAL, T3FREE, THYROIDAB in the last 72 hours.  Invalid input(s): FREET3 Anemia work up No results for input(s): VITAMINB12, FOLATE, FERRITIN, TIBC, IRON, RETICCTPCT in the last 72 hours. Urinalysis      Component Value Date/Time   COLORURINE BROWN (A) 08/04/2019 1117  APPEARANCEUR TURBID (A) 08/04/2019 1117   LABSPEC  08/04/2019 1117    TEST NOT REPORTED DUE TO COLOR INTERFERENCE OF URINE PIGMENT   PHURINE  08/04/2019 1117    TEST NOT REPORTED DUE TO COLOR INTERFERENCE OF URINE PIGMENT   GLUCOSEU (A) 08/04/2019 1117    TEST NOT REPORTED DUE TO COLOR INTERFERENCE OF URINE PIGMENT   HGBUR (A) 08/04/2019 1117    TEST NOT REPORTED DUE TO COLOR INTERFERENCE OF URINE PIGMENT   BILIRUBINUR (A) 08/04/2019 1117    TEST NOT REPORTED DUE TO COLOR INTERFERENCE OF URINE PIGMENT   KETONESUR (A) 08/04/2019 1117    TEST NOT REPORTED DUE TO COLOR INTERFERENCE OF URINE PIGMENT   PROTEINUR (A) 08/04/2019 1117    TEST NOT REPORTED DUE TO COLOR INTERFERENCE OF URINE PIGMENT   UROBILINOGEN 0.2 10/14/2008 0612   NITRITE (A) 08/04/2019 1117    TEST NOT REPORTED DUE TO COLOR INTERFERENCE OF URINE PIGMENT   LEUKOCYTESUR (A) 08/04/2019 1117    TEST NOT REPORTED DUE TO COLOR INTERFERENCE OF URINE PIGMENT     Microbiology Recent Results (from the past 240 hour(s))  Culture, blood (routine x 2)     Status: None   Collection Time: 07/26/19  5:29 PM   Specimen: BLOOD LEFT ARM  Result Value Ref Range Status   Specimen Description BLOOD LEFT ARM  Final   Special Requests   Final    BOTTLES DRAWN AEROBIC ONLY Blood Culture adequate volume   Culture   Final    NO GROWTH 5 DAYS Performed at [facility], [place] [address], [place], [place] [address]    Report Status 07/31/2019 FINAL  Final  Culture, blood (routine x 2)     Status: None   Collection Time: 07/26/19  5:29 PM   Specimen: BLOOD RIGHT ARM  Result Value Ref Range Status   Specimen Description BLOOD RIGHT ARM  Final   Special Requests   Final    BOTTLES DRAWN AEROBIC ONLY Blood Culture adequate volume   Culture   Final    NO GROWTH 5 DAYS Performed at [facility], [place] [address], [place], [place] [address]    Report Status  07/31/2019 FINAL  Final  MRSA PCR Screening     Status: None   Collection Time: 07/27/19 10:44 AM   Specimen: Nasopharyngeal  Result Value Ref Range Status   MRSA by PCR NEGATIVE NEGATIVE Final    Comment:        The GeneXpert MRSA Assay (FDA approved for NASAL specimens only), is one component of a comprehensive MRSA colonization surveillance program. It is not intended to diagnose MRSA infection nor to guide or monitor treatment for MRSA infections. Performed at [facility], [place] [address] [place], [place] [address]   Culture, Urine     Status: Abnormal   Collection Time: 07/31/19 10:05 PM   Specimen: Urine, Clean Catch  Result Value Ref Range Status   Specimen Description URINE, CLEAN CATCH  Final   Special Requests   Final    Normal Performed at [facility], [place] [address], [place], [place] [address]    Culture MULTIPLE SPECIES PRESENT, SUGGEST RECOLLECTION (A)  Final   Report Status 08/01/2019 FINAL  Final  C difficile quick scan w PCR reflex     Status: None   Collection Time: 07/31/19 10:05 PM   Specimen: Urine, Clean Catch; Stool  Result Value Ref Range Status   C Diff antigen  NEGATIVE NEGATIVE Final   C Diff toxin NEGATIVE NEGATIVE Final   C Diff interpretation No C. difficile detected.  Final    Comment: Performed at [facility], [place] [address], [place], [place] [address]  Culture, blood (routine x 2)     Status: None (Preliminary result)   Collection Time: 08/04/19 10:43 PM   Specimen: BLOOD  Result Value Ref Range Status   Specimen Description BLOOD LEFT HAND  Final   Special Requests   Final    BOTTLES DRAWN AEROBIC AND ANAEROBIC Blood Culture results may not be optimal due to an inadequate volume of blood received in culture bottles   Culture   Final    NO GROWTH < 12 HOURS Performed at [facility], [place] [address], [place], [place] [address]    Report Status PENDING  Incomplete  Culture, blood (routine x 2)     Status: None  (Preliminary result)   Collection Time: 08/04/19 10:46 PM   Specimen: BLOOD  Result Value Ref Range Status   Specimen Description BLOOD LEFT HAND  Final   Special Requests   Final    BOTTLES DRAWN AEROBIC AND ANAEROBIC Blood Culture adequate volume   Culture   Final    NO GROWTH < 12 HOURS Performed at [facility], [place] [address] [place], [place] [address]    Report Status PENDING  Incomplete       Inpatient Medications:   Scheduled Meds: . aspirin EC  81 mg Oral Daily  . Chlorhexidine Gluconate Cloth  6 each Topical Q0600  . clopidogrel  75 mg Oral Daily  . feeding supplement  1 Container Oral TID BM  . feeding supplement (NEPRO CARB STEADY)  237 mL Oral TID BM  . feeding supplement (PRO-STAT SUGAR FREE 64)  30 mL Oral BID  . folic acid  1 mg Oral Daily  . Gerhardt's butt cream   Topical QID  . insulin aspart  0-9 Units Subcutaneous TID WC  . metoCLOPramide  5 mg Oral TID AC  . miconazole nitrate   Topical BID  . midodrine  10 mg Oral Q M,W,F-HD  . multivitamin  1 tablet Oral QHS  . rosuvastatin  20 mg Oral q1800  . saccharomyces boulardii  250 mg Oral BID  . sevelamer carbonate  1,600 mg Oral TID WC  . vancomycin      . vitamin B-12  500 mcg Oral Daily   Continuous Infusions: . sodium chloride    . sodium chloride    . piperacillin-tazobactam (ZOSYN)  IV 2.25 g (08/05/19 0640)  . vancomycin       Radiological Exams on Admission: DG Chest 2 View  Result Date: 08/04/2019 CLINICAL DATA:  Patient is lethargic. EXAM: CHEST - 2 VIEW COMPARISON:  None. FINDINGS: Stable cardiomegaly. The hila and mediastinum are unchanged. Vascular calcifications are noted. A double lumen central line terminates in the right side of the atrium, stable. No nodule, mass, infiltrate, or overt edema. IMPRESSION: No active cardiopulmonary disease. Stable double lumen central line terminating in the right side of the atrium. Electronically Signed   By: Dorise Bullion III M.D   On:  08/04/2019 12:08   DG Abd 1 View  Result Date: 08/05/2019 CLINICAL DATA:  Ileus. EXAM: ABDOMEN - 1 VIEW COMPARISON:  08/04/2019.  CT 08/01/2019. FINDINGS: Persistent distention of small and large bowel loops, improved from prior exam. Findings consistent improving adynamic ileus. Pelvic calcifications most consistent with phleboliths. Aortoiliac and  visceral atherosclerotic vascular calcification. Degenerative change lumbar spine and both hips. IMPRESSION: Persistent distention of small large bowel loops, improved from prior exam. Findings consistent with improving adynamic ileus. Electronically Signed   By: Marcello Moores  Register   On: 08/05/2019 06:19   DG Abd 1 View  Result Date: 08/04/2019 CLINICAL DATA:  Abdominal distension. EXAM: ABDOMEN - 1 VIEW COMPARISON:  August 01, 2019 FINDINGS: Mild prominence of large and small bowel suggest the possibility of developing ileus. Moderate fecal loading in the left colon. Evaluation for free air is limited on supine imaging but none is seen. Sign IMPRESSION: Air-filled prominent loops of large and small bowel suggests developing ileus. Recommend attention on follow-up. Moderate fecal loading. No other acute abnormalities. Electronically Signed   By: Dorise Bullion III M.D   On: 08/04/2019 13:41    Impression/Recommendations Active Problems:   Small vessel cerebrovascular accident (CVA) ([place])   Controlled type 2 diabetes mellitus with hyperglycemia, without long-term current use of insulin ([place])   Labile blood glucose   Chronic cystitis   Diarrhea due to malabsorption   Lethargy   ESRD on dialysis ([place])  Sepsis, evidenced by elevated WBC count and fever yesterday.  Source is yet to find out.  Suspect lower abdomen pelvis vaginal source.  Discussed with nurse at bedside, and then discussed with OB/GYN attending, who will come to do vaginal exam and then a more recommendations.  WBC count slightly increased compared to yesterday, blood culture and urine  culture sent yesterday, chest x-ray unremarkable, continue broad-spectrum antibiotics for today, waiting for OB/GYN recommendations.  Hypotension orthostatic, on midodrine, BP running borderline low, pt rather asymptomatic, will give small bolus and monitor volume status closely.  Ileus, abd exam benign, ok to continue clears for today.  DTI on sacrum, wound care consult, miconazole topical.  Stroke, continue current meds and rehab plan.   Thank you for this consultation.  Our [facility] hospitalist team will follow the patient with you.  Chronic systolic CHF, small bolus and monitor volume status.  ESRD, HD MWF.  IDDM, FS fluctuating, continue sliding scale.  CAD, not acute issue.    Time Spent: 46  Lequita Halt M.D. [facility] 08/05/2019, 2:33 PM

## 2019-08-06 ENCOUNTER — Inpatient Hospital Stay (HOSPITAL_COMMUNITY): Payer: Medicare HMO

## 2019-08-06 ENCOUNTER — Inpatient Hospital Stay (HOSPITAL_COMMUNITY): Payer: Medicare HMO | Admitting: Occupational Therapy

## 2019-08-06 DIAGNOSIS — B952 Enterococcus as the cause of diseases classified elsewhere: Secondary | ICD-10-CM

## 2019-08-06 DIAGNOSIS — N898 Other specified noninflammatory disorders of vagina: Secondary | ICD-10-CM

## 2019-08-06 DIAGNOSIS — K567 Ileus, unspecified: Secondary | ICD-10-CM

## 2019-08-06 LAB — GLUCOSE, CAPILLARY
Glucose-Capillary: 150 mg/dL — ABNORMAL HIGH (ref 70–99)
Glucose-Capillary: 261 mg/dL — ABNORMAL HIGH (ref 70–99)
Glucose-Capillary: 199 mg/dL — ABNORMAL HIGH (ref 70–99)
Glucose-Capillary: 229 mg/dL — ABNORMAL HIGH (ref 70–99)

## 2019-08-06 LAB — WET PREP, GENITAL
Clue Cells Wet Prep HPF POC: NONE SEEN
Sperm: NONE SEEN
Trich, Wet Prep: NONE SEEN
Yeast Wet Prep HPF POC: NONE SEEN

## 2019-08-06 MED ORDER — CHLORHEXIDINE GLUCONATE CLOTH 2 % EX PADS
6.0000 | MEDICATED_PAD | Freq: Every day | CUTANEOUS | Status: DC
  Administered 2019-08-06 – 2019-08-07 (×2): 6 via TOPICAL

## 2019-08-06 NOTE — Progress Notes (Signed)
PROGRESS NOTE  [name] [id number] DOB: [birth year]   PCP: [name], MD  DOA: 07/27/2019 LOS: 58  Brief Narrative / Interim history: 78 y.o. female in [place] unit, with PMH of HTN, HLD, DM-2, systolic CHF, ESRD on HD MWF, CAD/CABG in 2010, AS, right thalamic CVA in 2018 and recent hospitalization for left ACA infarct who has been spiking intermittent fever lately for which [facility] service consulted out of concern for sepsis.  Reportedly had diarrhea last week.  C. difficile was negative on 12/16.  Patient had no respiratory or UTI symptoms but reports "urinary discharge" which has been going on for about a month.  She recently completed treatment for UTI on 12/19 with Keflex.  She also had a dynamic ileus which seems to have improved on x-ray on 12/21.  She spiked fever to 102.9 on 12/20 and had a mild leukocytosis that raised concern possible sepsis.  Patient was started on broad-spectrum antibiotics with Zosyn and vancomycin on 12/21.  Blood cultures negative so far.  UA seems to be bloody.  Urine culture with GNR and Enterococcus faecalis.  Recent MRSA PCR was negative.  C. difficile negative on 12/16.  Subjective: Sitting on her wheelchair.  Husband at bedside.  No complaint this morning.  She reports feeling better.  Denies sore throat, cough, chest pain, abdominal pain or UTI symptoms.  However, she reports "urinary discharge" but denies vaginal discharge or bleeding.  Diarrhea improved.   Objective: Vitals:   08/05/19 1700 08/05/19 1723 08/05/19 1944 08/06/19 0501  BP: (!) 93/42 (!) 106/45 (!) 113/49 (!) 117/55  Pulse: 83 81 78 84  Resp:  16 20 18   Temp:  97.6 F (36.4 C) 98.5 F (36.9 C) 98.4 F (36.9 C)  TempSrc:  Oral Oral Oral  SpO2:  95% 99% 97%  Weight:  53.2 kg  53.7 kg  Height:        Intake/Output Summary (Last 24 hours) at 08/06/2019 1253 Last data filed at 08/06/2019 0836 Gross per 24 hour  Intake 540 ml  Output 1000 ml  Net -460 ml   Filed Weights     08/05/19 1300 08/05/19 1723 08/06/19 0501  Weight: 54.4 kg 53.2 kg 53.7 kg    Examination:  GENERAL: No acute distress.  Appears well.  HEENT: MMM.  Vision and hearing grossly intact.  NECK: Supple.  No apparent JVD.  RESP:  No IWOB.  Fair air movement bilaterally. CVS:  RRR. Heart sounds normal.  ABD/GI/GU: Bowel sounds present. Soft. Non tender.  MSK/EXT:  No apparent deformity or edema. Moves extremities. SKIN: no apparent skin lesion or wound NEURO: Awake, alert and oriented appropriately.  PSYCH: Calm. Normal affect.  Procedures:  None  Assessment & Plan: Sepsis due to urinary tract infection: patient had mild leukocytosis and significant fever.  Last fever on 12/20.  She reports "urinary discharge".  Urine culture with GNR and Enterococcus faecalis.  C. difficile, blood culture and MRSA PCR negative.  Overall, patient reports improvement. -Discontinue vancomycin -Continue Zosyn pending culture sensitivity -Continue monitoring  Adynamic ileus: KUB on 12/21 with persistent distention of small or large bowel loops but improved. -Aggressive mobilization. -Advance to full liquid diet. -Repeat KUB in the morning.  Other medical conditions including hypertension, CVA, diastolic CHF, ESRD, DM-2 and CAD stable.   Pressure Injury 05/15/18 Stage I -  Intact skin with non-blanchable redness of a localized area usually over a bony prominence. (Active)  05/15/18 1959  Location: Sacrum  Location Orientation:  Staging: Stage I -  Intact skin with non-blanchable redness of a localized area usually over a bony prominence.  Wound Description (Comments):   Present on Admission: Yes     Pressure Injury 07/23/19 Buttocks Right Stage II -  Partial thickness loss of dermis presenting as a shallow open ulcer with a red, pink wound bed without slough. (Active)  07/23/19 1345  Location: Buttocks  Location Orientation: Right  Staging: Stage II -  Partial thickness loss of dermis  presenting as a shallow open ulcer with a red, pink wound bed without slough.  Wound Description (Comments):   Present on Admission: Yes     Nutrition Problem: Increased nutrient needs Etiology: chronic illness(ESRD on HD, CHF)  Signs/Symptoms: estimated needs  Interventions: MVI, Prostat, Nepro shake  Code Status: Full code Family Communication: Updated patient's husband at bedside. Disposition Plan: Per primary. Consultants: We are   Microbiology summarized: 12/16-C. difficile negative. 12/12-MRSA PCR negative. 12/20-blood cultures negative so far. 12/20-urine culture GNR and Enterococcus faecalis.   Sch Meds:  Scheduled Meds: . aspirin EC  81 mg Oral Daily  . Chlorhexidine Gluconate Cloth  6 each Topical Q0600  . Chlorhexidine Gluconate Cloth  6 each Topical Q0600  . clopidogrel  75 mg Oral Daily  . feeding supplement  1 Container Oral TID BM  . feeding supplement (NEPRO CARB STEADY)  237 mL Oral TID BM  . feeding supplement (PRO-STAT SUGAR FREE 64)  30 mL Oral BID  . folic acid  1 mg Oral Daily  . Gerhardt's butt cream   Topical QID  . insulin aspart  0-9 Units Subcutaneous TID WC  . metoCLOPramide  5 mg Oral TID AC  . miconazole nitrate   Topical BID  . midodrine  10 mg Oral Q M,W,F-HD  . multivitamin  1 tablet Oral QHS  . rosuvastatin  20 mg Oral q1800  . saccharomyces boulardii  250 mg Oral BID  . sevelamer carbonate  1,600 mg Oral TID WC  . vitamin B-12  500 mcg Oral Daily   Continuous Infusions: . sodium chloride 100 mL (08/05/19 2238)  . sodium chloride    . piperacillin-tazobactam (ZOSYN)  IV 2.25 g (08/06/19 0602)  . vancomycin     PRN Meds:.sodium chloride, sodium chloride, acetaminophen **OR** acetaminophen (TYLENOL) oral liquid 160 mg/5 mL **OR** acetaminophen, camphor-menthol, diphenhydrAMINE, ondansetron, sorbitol  Antimicrobials: Anti-infectives (From admission, onward)   Start     Dose/Rate Route Frequency Ordered Stop   08/05/19 1423   vancomycin (VANCOCIN) 500-5 MG/100ML-% IVPB    Note to Pharmacy: Ronny Bacon  : cabinet override      08/05/19 1423 08/05/19 1639   08/05/19 1200  vancomycin (VANCOREADY) IVPB 500 mg/100 mL     500 mg 100 mL/hr over 60 Minutes Intravenous Every M-W-F (Hemodialysis) 08/04/19 2145     08/04/19 2200  vancomycin (VANCOREADY) IVPB 1250 mg/250 mL     1,250 mg 166.7 mL/hr over 90 Minutes Intravenous  Once 08/04/19 2145 08/04/19 2343   08/04/19 2200  piperacillin-tazobactam (ZOSYN) IVPB 2.25 g     2.25 g 100 mL/hr over 30 Minutes Intravenous Every 8 hours 08/04/19 2145     08/02/19 0630  cefTRIAXone (ROCEPHIN) 2 g in sodium chloride 0.9 % 100 mL IVPB  Status:  Discontinued     2 g 200 mL/hr over 30 Minutes Intravenous Daily 08/02/19 0534 08/04/19 2145   08/01/19 0815  cephALEXin (KEFLEX) capsule 250 mg  Status:  Discontinued     250 mg  Oral Every 12 hours 08/01/19 0804 08/02/19 0534       I have personally reviewed the following labs and images: CBC: Recent Labs  Lab 07/31/19 1451 08/01/19 0637 08/02/19 1510 08/04/19 1025 08/05/19 0203  WBC 12.2* 11.1* 9.4 11.9* 12.4*  NEUTROABS  --  7.7  --   --  9.2*  HGB 11.3* 11.4* 9.8* 9.8* 9.3*  HCT 36.5 35.9* 31.0* 30.1* 28.2*  MCV 109.6* 107.5* 107.6* 104.5* 105.2*  PLT 155 153 148* 157 141*   BMP &GFR Recent Labs  Lab 07/31/19 1451 08/02/19 1510 08/04/19 1025  NA 130* 128* 130*  K 4.9 3.9 3.5  CL 91* 89* 94*  CO2 24 24 21*  GLUCOSE 383* 273* 155*  BUN 92* 86* 80*  CREATININE 6.82* 5.88* 5.36*  CALCIUM 8.7* 8.3* 8.7*  PHOS 3.2 3.1  --    Estimated Creatinine Clearance: 7.5 mL/min (A) (by C-G formula based on SCr of 5.36 mg/dL (H)). Liver & Pancreas: Recent Labs  Lab 07/31/19 1451 08/02/19 1510 08/04/19 1025  AST  --   --  22  ALT  --   --  17  ALKPHOS  --   --  77  BILITOT  --   --  0.5  PROT  --   --  5.9*  ALBUMIN 2.6* 2.2* 2.2*   No results for input(s): LIPASE, AMYLASE in the last 168 hours. No results  for input(s): AMMONIA in the last 168 hours. Diabetic: No results for input(s): HGBA1C in the last 72 hours. Recent Labs  Lab 08/05/19 1145 08/05/19 1855 08/05/19 2112 08/06/19 0610 08/06/19 1136  GLUCAP 251* 96 149* 150* 261*   Cardiac Enzymes: No results for input(s): CKTOTAL, CKMB, CKMBINDEX, TROPONINI in the last 168 hours. No results for input(s): PROBNP in the last 8760 hours. Coagulation Profile: No results for input(s): INR, PROTIME in the last 168 hours. Thyroid Function Tests: No results for input(s): TSH, T4TOTAL, FREET4, T3FREE, THYROIDAB in the last 72 hours. Lipid Profile: No results for input(s): CHOL, HDL, LDLCALC, TRIG, CHOLHDL, LDLDIRECT in the last 72 hours. Anemia Panel: No results for input(s): VITAMINB12, FOLATE, FERRITIN, TIBC, IRON, RETICCTPCT in the last 72 hours. Urine analysis:    Component Value Date/Time   COLORURINE BROWN (A) 08/04/2019 1117   APPEARANCEUR TURBID (A) 08/04/2019 1117   LABSPEC  08/04/2019 1117    TEST NOT REPORTED DUE TO COLOR INTERFERENCE OF URINE PIGMENT   PHURINE  08/04/2019 1117    TEST NOT REPORTED DUE TO COLOR INTERFERENCE OF URINE PIGMENT   GLUCOSEU (A) 08/04/2019 1117    TEST NOT REPORTED DUE TO COLOR INTERFERENCE OF URINE PIGMENT   HGBUR (A) 08/04/2019 1117    TEST NOT REPORTED DUE TO COLOR INTERFERENCE OF URINE PIGMENT   BILIRUBINUR (A) 08/04/2019 1117    TEST NOT REPORTED DUE TO COLOR INTERFERENCE OF URINE PIGMENT   KETONESUR (A) 08/04/2019 1117    TEST NOT REPORTED DUE TO COLOR INTERFERENCE OF URINE PIGMENT   PROTEINUR (A) 08/04/2019 1117    TEST NOT REPORTED DUE TO COLOR INTERFERENCE OF URINE PIGMENT   UROBILINOGEN 0.2 10/14/2008 0612   NITRITE (A) 08/04/2019 1117    TEST NOT REPORTED DUE TO COLOR INTERFERENCE OF URINE PIGMENT   LEUKOCYTESUR (A) 08/04/2019 1117    TEST NOT REPORTED DUE TO COLOR INTERFERENCE OF URINE PIGMENT   Sepsis Labs: Invalid input(s): PROCALCITONIN, [place]  Microbiology: Recent  Results (from the past 240 hour(s))  Culture, Urine     Status:  Abnormal   Collection Time: 07/31/19 10:05 PM   Specimen: Urine, Clean Catch  Result Value Ref Range Status   Specimen Description URINE, CLEAN CATCH  Final   Special Requests   Final    Normal Performed at [facility], [address] [place], [place] [address]    Culture MULTIPLE SPECIES PRESENT, SUGGEST RECOLLECTION (A)  Final   Report Status 08/01/2019 FINAL  Final  C difficile quick scan w PCR reflex     Status: None   Collection Time: 07/31/19 10:05 PM   Specimen: Urine, Clean Catch; Stool  Result Value Ref Range Status   C Diff antigen NEGATIVE NEGATIVE Final   C Diff toxin NEGATIVE NEGATIVE Final   C Diff interpretation No C. difficile detected.  Final    Comment: Performed at [facility], [place] [address], [place], [place] [address]  Culture, Urine     Status: Abnormal (Preliminary result)   Collection Time: 08/04/19 11:00 AM   Specimen: Urine, Random  Result Value Ref Range Status   Specimen Description URINE, RANDOM  Final   Special Requests   Final    NONE Performed at [facility], [place] [address] [place], [place] [address]    Culture (A)  Final    >=100,000 COLONIES/mL GRAM NEGATIVE RODS >=100,000 COLONIES/mL ENTEROCOCCUS FAECALIS    Report Status PENDING  Incomplete  Culture, blood (routine x 2)     Status: None (Preliminary result)   Collection Time: 08/04/19 10:43 PM   Specimen: BLOOD  Result Value Ref Range Status   Specimen Description BLOOD LEFT HAND  Final   Special Requests   Final    BOTTLES DRAWN AEROBIC AND ANAEROBIC Blood Culture results may not be optimal due to an inadequate volume of blood received in culture bottles   Culture   Final    NO GROWTH 2 DAYS Performed at [facility], [place] [address] [place], [place] [address]    Report Status PENDING  Incomplete  Culture, blood (routine x 2)     Status: None (Preliminary result)   Collection Time: 08/04/19  10:46 PM   Specimen: BLOOD  Result Value Ref Range Status   Specimen Description BLOOD LEFT HAND  Final   Special Requests   Final    BOTTLES DRAWN AEROBIC AND ANAEROBIC Blood Culture adequate volume   Culture   Final    NO GROWTH 2 DAYS Performed at [facility], [place] [address], [place], [place] [address]    Report Status PENDING  Incomplete    Radiology Studies: No results found.    Phyllis Abelson T. [place]  If 7PM-7AM, please contact night-coverage [email or web address] Password [facility] 08/06/2019, 12:53 PM

## 2019-08-06 NOTE — Consult Note (Signed)
Reason for Consult:Vaginal discharge Referring Physician:   [name] is an 78 y.o. female who was admitted to rehab unit due to recent CVA. Nursing has noted a vaginal discharge the last few days. Pt reports she had a vaginal discharge several weeks ago. Thought related to UTI. Resolved without problems. She denies any vaginal bleeding or itching. Not sexual active. Postmenopausal since age 51. Unknown last pap smear.   G1P1  TSVD x 1  Menstrual History: Menarche age: 12 No LMP recorded. Patient is postmenopausal.    Past Medical History:  Diagnosis Date  . Anemia    Pt is taking iron.   [name] Anxiety   . Arthritis   . Carotid stenosis    40-59% bilateral ICA stenosis in 2/12.  . Chronic low back pain   . CKD (chronic kidney disease)    Dr. Audie Clear at [facility]  . Coronary artery disease    Pt presented 2/10 to [facility] with NSTEMI and diastolic CHF exacerbation.  LHC was done  3/10 showing 99% pRCA stenosis and 80% calcified pLAD stenosis with L=>R collaterals.  Pt was referred  for CABG which was done by Dr. Prescott Gum with LIMA-LAD, SVG-RCA, SVG-OM.  . Diabetes mellitus   . Diabetic neuropathy ([place])   . Diastolic CHF (H[facility])    Echo (2/10) showed EF 55-65%, mild LVH, diastolic dysfunction, mild AS with mean gradient 12 mmHg, PASP 43 mmHg.  Echo (2/12): EF 55-60%, mild LVH, mild AS (mean gradient 12), PA systolic pressure 32 mmHg.     [name] GERD (gastroesophageal reflux disease)   . Heart murmur   . Hyperlipidemia   . Hypertension   . Mild aortic stenosis    mean gradient 12 mmHg in 2/12.  . Myocardial infarction ([place])    "mild"  . Pneumonia   . PONV (postoperative nausea and vomiting)   . Stroke [facility])    " mild"  . Thrombocytopenia ([place])   . Unsteady gait     Past Surgical History:  Procedure Laterality Date  . AORTIC ARCH ANGIOGRAPHY N/A 10/18/2018   Procedure: AORTIC ARCH ANGIOGRAPHY;  Surgeon: Marty Heck, MD;  Location: [place] CV LAB;   Service: Cardiovascular;  Laterality: N/A;  . AV FISTULA PLACEMENT Left 01/30/2015   Procedure: Creation of a Radial Cephalic AV Fistula left wrist;  Surgeon: [name], MD;  Location: [place];  Service: Vascular;  Laterality: Left;  . AV FISTULA PLACEMENT Left 09/06/2018   Procedure: LEFT ARM ARTERIOVENOUS (AV) FISTULA CREATION;  Surgeon: Marty Heck, MD;  Location: [place];  Service: Vascular;  Laterality: Left;  . BACK SURGERY     multiple  . BREAST SURGERY     biopsy  . BUBBLE STUDY  08/02/2019   Procedure: BUBBLE STUDY;  Surgeon: Skeet Latch, MD;  Location: [place];  Service: Cardiovascular;;  . CARDIAC CATHETERIZATION    . CATARACT EXTRACTION W/ INTRAOCULAR LENS  IMPLANT, BILATERAL    . COLONOSCOPY Left 11/03/2016   Procedure: COLONOSCOPY;  Surgeon: [name], MD;  Location: [facility] ENDOSCOPY;  Service: Endoscopy;  Laterality: Left;  . COLONOSCOPY W/ BIOPSIES AND POLYPECTOMY    . CORONARY ARTERY BYPASS GRAFT  09/2008   pt with NSTEMI and diastolic CHF exacerbation.  LHC was done  3/10 showing 99% pRCA stenosis and 80% calcified pLAD stenosis with L=>R collaterals.  Pt was referred  for CABG which was done by Dr. Prescott Gum with LIMA-LAD, SVG-RCA, SVG-OM.  [name] ESOPHAGOGASTRODUODENOSCOPY N/A 11/02/2016  Procedure: ESOPHAGOGASTRODUODENOSCOPY (EGD);  Surgeon: [name], MD;  Location: [facility] ENDOSCOPY;  Service: Endoscopy;  Laterality: N/A;  . GIVENS CAPSULE STUDY N/A 11/29/2016   Procedure: GIVENS CAPSULE STUDY;  Surgeon: [name], MD;  Location: [place];  Service: Endoscopy;  Laterality: N/A;  . LIGATION OF ARTERIOVENOUS  FISTULA Left 09/13/2018   Procedure: LIGATION OF LEFT ARTERIOVENOUS  FISTULA;  Surgeon: Waynetta Sandy, MD;  Location: [place];  Service: Vascular;  Laterality: Left;  . REVISON OF ARTERIOVENOUS FISTULA Left 07/02/2015   Procedure: REVISON OF LEFT RADIOCEPHALIC ARTERIOVENOUS FISTULA;  Surgeon: [name], MD;  Location: [place];  Service:  Vascular;  Laterality: Left;  . RIGHT/LEFT HEART CATH AND CORONARY/GRAFT ANGIOGRAPHY N/A 05/18/2018   Procedure: RIGHT/LEFT HEART CATH AND CORONARY/GRAFT ANGIOGRAPHY;  Surgeon: Leonie Man, MD;  Location: [place] CV LAB;  Service: Cardiovascular;  Laterality: N/A;  . TEE WITHOUT CARDIOVERSION N/A 08/02/2019   Procedure: TRANSESOPHAGEAL ECHOCARDIOGRAM (TEE);  Surgeon: Skeet Latch, MD;  Location: [place];  Service: Cardiovascular;  Laterality: N/A;  . UPPER EXTREMITY ANGIOGRAPHY Left 10/18/2018   Procedure: UPPER EXTREMITY ANGIOGRAPHY;  Surgeon: Marty Heck, MD;  Location: [place] CV LAB;  Service: Cardiovascular;  Laterality: Left;    Family History  Adopted: Yes  Family history unknown: Yes    Social History:  reports that she has quit smoking. Her smoking use included cigarettes. She has never used smokeless tobacco. She reports current alcohol use. She reports that she does not use drugs.  Allergies:  Allergies  Allergen Reactions  . Doxycycline Nausea And Vomiting    Caused "DEATHLY NAUSEA AND VOMITING"  . Lipitor [Atorvastatin] Other (See Comments)    Stomach pain  . Strawberry Extract Rash    Medications: I have reviewed the patient's current medications.  Review of Systems  Some urinary incont and bowel incont  Blood pressure 124/61, pulse 85, temperature 98.6 F (37 C), temperature source Oral, resp. rate 16, height 5\' 8"  (1.727 m), weight 53.7 kg, SpO2 100 %. Physical Exam Elderly appearing female in NAD Lungs clear Heart RRR Abd soft + BS GU Nl EGBUS, vaginal mucosa atrophy, white discharge noted Swabs collected Results for orders placed or performed during the hospital encounter of 07/27/19 (from the past 48 hour(s))  Glucose, capillary     Status: Abnormal   Collection Time: 08/04/19  4:43 PM  Result Value Ref Range   Glucose-Capillary 197 (H) 70 - 99 mg/dL  Glucose, capillary     Status: Abnormal   Collection Time: 08/04/19  9:06  PM  Result Value Ref Range   Glucose-Capillary 214 (H) 70 - 99 mg/dL  Culture, blood (routine x 2)     Status: None (Preliminary result)   Collection Time: 08/04/19 10:43 PM   Specimen: BLOOD  Result Value Ref Range   Specimen Description BLOOD LEFT HAND    Special Requests      BOTTLES DRAWN AEROBIC AND ANAEROBIC Blood Culture results may not be optimal due to an inadequate volume of blood received in culture bottles   Culture      NO GROWTH 2 DAYS Performed at [facility] [address] [place], [place] [address]    Report Status PENDING   Lactic acid, plasma     Status: None   Collection Time: 08/04/19 10:43 PM  Result Value Ref Range   Lactic Acid, Venous 0.8 0.5 - 1.9 mmol/L    Comment: Performed at [facility]  [address] [place], [place] [address]  Culture, blood (routine x 2)     Status: None (Preliminary result)   Collection Time: 08/04/19 10:46 PM   Specimen: BLOOD  Result Value Ref Range   Specimen Description BLOOD LEFT HAND    Special Requests      BOTTLES DRAWN AEROBIC AND ANAEROBIC Blood Culture adequate volume   Culture      NO GROWTH 2 DAYS Performed at [facility], [place] [address] [place], [place] [address]    Report Status PENDING   Lactic acid, plasma     Status: None   Collection Time: 08/05/19  2:03 AM  Result Value Ref Range   Lactic Acid, Venous 0.5 0.5 - 1.9 mmol/L    Comment: Performed at [facility] [address], [place], [place] [address]  AM CBC     Status: Abnormal   Collection Time: 08/05/19  2:03 AM  Result Value Ref Range   WBC 12.4 (H) 4.0 - 10.5 K/uL   RBC 2.68 (L) 3.87 - 5.11 MIL/uL   Hemoglobin 9.3 (L) 12.0 - 15.0 g/dL   HCT 28.2 (L) 36.0 - 46.0 %   MCV 105.2 (H) 80.0 - 100.0 fL   MCH 34.7 (H) 26.0 - 34.0 pg   MCHC 33.0 30.0 - 36.0 g/dL   RDW 16.6 (H) 11.5 - 15.5 %   Platelets 141 (L) 150 - 400 K/uL   nRBC 0.0 0.0 - 0.2 %   Neutrophils Relative % 74 %   Neutro Abs 9.2 (H) 1.7 - 7.7 K/uL    Lymphocytes Relative 10 %   Lymphs Abs 1.3 0.7 - 4.0 K/uL   Monocytes Relative 14 %   Monocytes Absolute 1.7 (H) 0.1 - 1.0 K/uL   Eosinophils Relative 1 %   Eosinophils Absolute 0.1 0.0 - 0.5 K/uL   Basophils Relative 0 %   Basophils Absolute 0.0 0.0 - 0.1 K/uL   Immature Granulocytes 1 %   Abs Immature Granulocytes 0.08 (H) 0.00 - 0.07 K/uL    Comment: Performed at [facility], [address] [place], [place] [address]  Glucose, capillary     Status: Abnormal   Collection Time: 08/05/19  6:13 AM  Result Value Ref Range   Glucose-Capillary 127 (H) 70 - 99 mg/dL  Glucose, capillary     Status: Abnormal   Collection Time: 08/05/19 11:45 AM  Result Value Ref Range   Glucose-Capillary 251 (H) 70 - 99 mg/dL  Glucose, capillary     Status: None   Collection Time: 08/05/19  6:55 PM  Result Value Ref Range   Glucose-Capillary 96 70 - 99 mg/dL  Glucose, capillary     Status: Abnormal   Collection Time: 08/05/19  9:12 PM  Result Value Ref Range   Glucose-Capillary 149 (H) 70 - 99 mg/dL  Glucose, capillary     Status: Abnormal   Collection Time: 08/06/19  6:10 AM  Result Value Ref Range   Glucose-Capillary 150 (H) 70 - 99 mg/dL  Glucose, capillary     Status: Abnormal   Collection Time: 08/06/19 11:36 AM  Result Value Ref Range   Glucose-Capillary 261 (H) 70 - 99 mg/dL    DG Abd 1 View  Result Date: 08/05/2019 CLINICAL DATA:  Ileus. EXAM: ABDOMEN - 1 VIEW COMPARISON:  08/04/2019.  CT 08/01/2019. FINDINGS: Persistent distention of small and large bowel loops, improved from prior exam. Findings consistent improving adynamic ileus. Pelvic calcifications most consistent with phleboliths. Aortoiliac and  visceral atherosclerotic vascular calcification. Degenerative change lumbar spine and both hips. IMPRESSION: Persistent distention of small large bowel loops, improved from prior exam. Findings consistent with improving adynamic ileus. Electronically Signed   By: Marcello Moores  Register   On:  08/05/2019 06:19    Assessment/Plan: Vaginal discharge  Vaginal swabs collected. Will treat as per results. Discussed with pt. Will follow   Chancy Milroy 08/06/2019

## 2019-08-06 NOTE — Progress Notes (Signed)
Speech Language Pathology Daily Session Note  Patient Details  Name: [name] MRN: [id number] Date of Birth: 12-16-1940  Today's Date: 08/06/2019 SLP Individual Time: 1415-1458 SLP Individual Time Calculation (min): 43 min  Short Term Goals: Week 2: SLP Short Term Goal 1 (Week 2): Pt will use external aids to recall daily information with Supervision assist verbal cues. SLP Short Term Goal 2 (Week 2): Pt will complete mildly complex tasks with min assist verbal cues for functional problem solving. SLP Short Term Goal 3 (Week 2): Pt will locate items to right of midline with Supervision A verbal/visual cues.  Skilled Therapeutic Interventions: Skilled ST services focused on cognitive skills. SLP facilitated basic problem solving, recall and right scanning skills in familiar card task, pt required supervision A verbal cues for recall of rules with aid and scanning right of midline, with min A verbal cues fade to supervision A verbal cues for problem solving (1/2 way through task.) Pt was unable to recall rules or participating in familiar card task prior to today's session, although card task was previously used by primary ST. Pt was left in room with call bell within reach and bed alarm set. ST recommends to continue skilled ST services.      Pain Pain Assessment Pain Score: 0-No pain  Therapy/Group: Individual Therapy  [name]  [facility] 08/06/2019, 4:01 PM

## 2019-08-06 NOTE — Progress Notes (Signed)
Occupational Therapy Session Note  Patient Details  Name: [name] MRN: [id number] Date of Birth: 1940/09/01  Today's Date: 08/06/2019 OT Individual Time: 1100-1130 OT Individual Time Calculation (min): 30 min    Short Term Goals: Week 2:  OT Short Term Goal 1 (Week 2): Pt will be able to sit EOB with mod A for balance to prepare for her ability to sit unsupported on a toilet. OT Short Term Goal 2 (Week 2): Pt will be able to use a slide board with mod A of 1 bed to w/c to prepare for her ability to do toilet transfers. OT Short Term Goal 3 (Week 2): Pt will demonstrate improved awareness of RUE with self ROM with min A and min cues.  Skilled Therapeutic Interventions/Progress Updates:    Pt seen this session for RUE positioning and PROM to facilitate NMR. Pt sits in recliner chair in which a half lap tray will not fit but her arm keeps sliding of the pillow support.  Provided pt with a full lap tray for BUE support to A with postural control and midline awareness when she is in full upright position.  Worked on PROM with therapist supporting arm and then with arm on tray with towel underneath for towel slides. Performed numerous reps of ROM which pt stated felt very good to her.  Some increase tone felt in triceps but no active movement observed.   Pt in room with spouse and all needs met.  Therapy Documentation Precautions:  Precautions Precautions: Fall Precaution Comments: right hemiplegia Restrictions Weight Bearing Restrictions: No  Pain: Pain Assessment Pain Scale: Faces Pain Score: 0-No pain    Therapy/Group: Individual Therapy  [place] 08/06/2019, 12:37 PM

## 2019-08-06 NOTE — Progress Notes (Signed)
[facility] ROUNDING NOTE   Subjective:   Is a 78 year old lady with history of hypertension hyperlipidemia diabetes history of GI bleed diastolic heart failure end-stage renal disease hemodialysis Monday Wednesday Friday.  She has a history of coronary artery disease status post CABG 2010.  She has aortic stenosis and history of a right thalamic CVA in 2018 and tobacco abuse.  She presented 07/23/2019 with right-sided weakness cranial CT showed no evidence of intracranial hemorrhage chronic small vessel ischemia and generalized volume loss.  Patient did not receive CTPA.  CT angiogram is negative for large vessel occlusion positive for significant cranial extracranial atherosclerotic disease.  MRI of the brain of showed areas of acute infarction throughout the distal left anterior cerebral artery territory.  Echocardiogram showed ejection fraction 30% without emboli however did show cath in the right atrium with an oscillating density considered to be vegetations versus thrombus.  TEE was suggesting 35% ejection fraction mild mitral regurg tricuspid regurg and aortic stenosis.  Tolerated dialysis 1221 2021 L ultrafiltered  Blood pressure 117/55 pulse 84 temperature 98.4 O2 sats 100% on room air  No recent labs.  Aspirin 81 mg daily, X Plavix 75 mg daily, folic acid 1 mg daily, Reglan 5 mg 3 times daily, sliding scale insulin, Crestor 20 mg daily Renvela 1.6 g with meals  Objective:  Vital signs in last 24 hours:  Temp:  [97.5 F (36.4 C)-98.5 F (36.9 C)] 98.4 F (36.9 C) (12/22 0501) Pulse Rate:  [77-85] 84 (12/22 0501) Resp:  [16-20] 18 (12/22 0501) BP: (72-117)/(38-55) 117/55 (12/22 0501) SpO2:  [95 %-99 %] 97 % (12/22 0501) Weight:  [53.2 kg-54.4 kg] 53.7 kg (12/22 0501)  Weight change: 0.5 kg Filed Weights   08/05/19 1300 08/05/19 1723 08/06/19 0501  Weight: 54.4 kg 53.2 kg 53.7 kg    Intake/Output: I/O last 3 completed shifts: In: 658.5 [P.O.:360; IV  Piggyback:298.5] Out: [id number] [Urine:450; Other:1000]   Intake/Output this shift:  Total I/O In: 300 [P.O.:300] Out: -   General:NAD, chronically ill appearing female, sitting up in bed Heart:RRR Lungs:CTAB Abdomen:soft, NTND Extremities:no LE edema Dialysis Access: R Ij [facility]    Basic Metabolic Panel: Recent Labs  Lab 07/31/19 1451 08/02/19 1510 08/04/19 1025  NA 130* 128* 130*  K 4.9 3.9 3.5  CL 91* 89* 94*  CO2 24 24 21*  GLUCOSE 383* 273* 155*  BUN 92* 86* 80*  CREATININE 6.82* 5.88* 5.36*  CALCIUM 8.7* 8.3* 8.7*  PHOS 3.2 3.1  --     Liver Function Tests: Recent Labs  Lab 07/31/19 1451 08/02/19 1510 08/04/19 1025  AST  --   --  22  ALT  --   --  17  ALKPHOS  --   --  77  BILITOT  --   --  0.5  PROT  --   --  5.9*  ALBUMIN 2.6* 2.2* 2.2*   No results for input(s): LIPASE, AMYLASE in the last 168 hours. No results for input(s): AMMONIA in the last 168 hours.  CBC: Recent Labs  Lab 07/31/19 1451 08/01/19 0637 08/02/19 1510 08/04/19 1025 08/05/19 0203  WBC 12.2* 11.1* 9.4 11.9* 12.4*  NEUTROABS  --  7.7  --   --  9.2*  HGB 11.3* 11.4* 9.8* 9.8* 9.3*  HCT 36.5 35.9* 31.0* 30.1* 28.2*  MCV 109.6* 107.5* 107.6* 104.5* 105.2*  PLT 155 153 148* 157 141*    Cardiac Enzymes: No results for input(s): CKTOTAL, CKMB, CKMBINDEX, TROPONINI in the last 168 hours.  BNP: Invalid input(s): POCBNP  CBG: Recent Labs  Lab 08/05/19 0613 08/05/19 1145 08/05/19 1855 08/05/19 2112 08/06/19 0610  GLUCAP 127* 251* 96 149* 150*    Microbiology: Results for orders placed or performed during the hospital encounter of 07/27/19  Culture, Urine     Status: Abnormal   Collection Time: 07/31/19 10:05 PM   Specimen: Urine, Clean Catch  Result Value Ref Range Status   Specimen Description URINE, CLEAN CATCH  Final   Special Requests   Final    Normal Performed at [facility], [place] [address], [place], [place] [address]    Culture MULTIPLE SPECIES PRESENT,  SUGGEST RECOLLECTION (A)  Final   Report Status 08/01/2019 FINAL  Final  C difficile quick scan w PCR reflex     Status: [name]   Collection Time: 07/31/19 10:05 PM   Specimen: Urine, Clean Catch; Stool  Result Value Ref Range Status   C Diff antigen NEGATIVE NEGATIVE Final   C Diff toxin NEGATIVE NEGATIVE Final   C Diff interpretation No C. difficile detected.  Final    Comment: Performed at [facility], [place] [address] [place], [place] [address]  Culture, Urine     Status: Abnormal (Preliminary result)   Collection Time: 08/04/19 11:00 AM   Specimen: Urine, Random  Result Value Ref Range Status   Specimen Description URINE, RANDOM  Final   Special Requests   Final    [name] Performed at [facility], [place] [address] [place], [place] [address]    Culture (A)  Final    >=100,000 COLONIES/mL GRAM NEGATIVE RODS >=100,000 COLONIES/mL ENTEROCOCCUS FAECALIS    Report Status PENDING  Incomplete  Culture, blood (routine x 2)     Status: [name] (Preliminary result)   Collection Time: 08/04/19 10:43 PM   Specimen: BLOOD  Result Value Ref Range Status   Specimen Description BLOOD LEFT HAND  Final   Special Requests   Final    BOTTLES DRAWN AEROBIC AND ANAEROBIC Blood Culture results may not be optimal due to an inadequate volume of blood received in culture bottles   Culture   Final    NO GROWTH 2 DAYS Performed at [facility], [place] [address] [place], [place] [address]    Report Status PENDING  Incomplete  Culture, blood (routine x 2)     Status: [name] (Preliminary result)   Collection Time: 08/04/19 10:46 PM   Specimen: BLOOD  Result Value Ref Range Status   Specimen Description BLOOD LEFT HAND  Final   Special Requests   Final    BOTTLES DRAWN AEROBIC AND ANAEROBIC Blood Culture adequate volume   Culture   Final    NO GROWTH 2 DAYS Performed at [facility], [place] [address], [place], [place] [address]    Report Status PENDING  Incomplete    Coagulation  Studies: No results for input(s): LABPROT, INR in the last 72 hours.  Urinalysis: Recent Labs    08/04/19 1117  COLORURINE BROWN*  LABSPEC TEST NOT REPORTED DUE TO COLOR INTERFERENCE OF URINE PIGMENT  PHURINE TEST NOT REPORTED DUE TO COLOR INTERFERENCE OF URINE PIGMENT  GLUCOSEU TEST NOT REPORTED DUE TO COLOR INTERFERENCE OF URINE PIGMENT*  HGBUR TEST NOT REPORTED DUE TO COLOR INTERFERENCE OF URINE PIGMENT*  BILIRUBINUR TEST NOT REPORTED DUE TO COLOR INTERFERENCE OF URINE PIGMENT*  KETONESUR TEST NOT REPORTED DUE TO COLOR INTERFERENCE OF URINE PIGMENT*  PROTEINUR TEST NOT REPORTED DUE TO COLOR  INTERFERENCE OF URINE PIGMENT*  NITRITE TEST NOT REPORTED DUE TO COLOR INTERFERENCE OF URINE PIGMENT*  LEUKOCYTESUR TEST NOT REPORTED DUE TO COLOR INTERFERENCE OF URINE PIGMENT*      Imaging: DG Abd 1 View  Result Date: 08/05/2019 CLINICAL DATA:  Ileus. EXAM: ABDOMEN - 1 VIEW COMPARISON:  08/04/2019.  CT 08/01/2019. FINDINGS: Persistent distention of small and large bowel loops, improved from prior exam. Findings consistent improving adynamic ileus. Pelvic calcifications most consistent with phleboliths. Aortoiliac and visceral atherosclerotic vascular calcification. Degenerative change lumbar spine and both hips. IMPRESSION: Persistent distention of small large bowel loops, improved from prior exam. Findings consistent with improving adynamic ileus. Electronically Signed   By: Marcello Moores  Register   On: 08/05/2019 06:19   DG Abd 1 View  Result Date: 08/04/2019 CLINICAL DATA:  Abdominal distension. EXAM: ABDOMEN - 1 VIEW COMPARISON:  August 01, 2019 FINDINGS: Mild prominence of large and small bowel suggest the possibility of developing ileus. Moderate fecal loading in the left colon. Evaluation for free air is limited on supine imaging but [name] is seen. Sign IMPRESSION: Air-filled prominent loops of large and small bowel suggests developing ileus. Recommend attention on follow-up. Moderate fecal  loading. No other acute abnormalities. Electronically Signed   By: Dorise Bullion III M.D   On: 08/04/2019 13:41     Medications:   . sodium chloride 100 mL (08/05/19 2238)  . sodium chloride    . piperacillin-tazobactam (ZOSYN)  IV 2.25 g (08/06/19 0602)  . vancomycin     . aspirin EC  81 mg Oral Daily  . Chlorhexidine Gluconate Cloth  6 each Topical Q0600  . clopidogrel  75 mg Oral Daily  . feeding supplement  1 Container Oral TID BM  . feeding supplement (NEPRO CARB STEADY)  237 mL Oral TID BM  . feeding supplement (PRO-STAT SUGAR FREE 64)  30 mL Oral BID  . folic acid  1 mg Oral Daily  . Gerhardt's butt cream   Topical QID  . insulin aspart  0-9 Units Subcutaneous TID WC  . metoCLOPramide  5 mg Oral TID AC  . miconazole nitrate   Topical BID  . midodrine  10 mg Oral Q M,W,F-HD  . multivitamin  1 tablet Oral QHS  . rosuvastatin  20 mg Oral q1800  . saccharomyces boulardii  250 mg Oral BID  . sevelamer carbonate  1,600 mg Oral TID WC  . vitamin B-12  500 mcg Oral Daily   sodium chloride, sodium chloride, acetaminophen **OR** acetaminophen (TYLENOL) oral liquid 160 mg/5 mL **OR** acetaminophen, camphor-menthol, diphenhydrAMINE, ondansetron, sorbitol  Assessment/ Plan:  1. 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 Adm to CIR 12/12 2. ESRD:Continue HD per MWF.Resume tight heparin Dr. Jonnie Finner spoke w/ Dr Posey Pronto , cath placed CK Vasc in September 2020.  Tolerated dialysis with 1 L ultrafiltered.  We will plan dialysis treatment 08/07/2019 3. Hypertension/volume:Permissive HTNinitially, now hypotensive -does not appear to have excess volume, titrate down as tolerated. Midodrine with HD.  4. Anemia:Hgb11.4>9.8,stable - no ESA needed, if Hgb remains <10, will start ESA. 5. Metabolic bone  disease:Ca/Phos to goal. Continue home Sevelamer as binder.-un beknownst to her dialysis unit, she tells me she was also taking calcitriol at home - iPTH in goal - will d/c calcitriol and switch to TIW hectorol -home calcitriol should not be resumed at d/c 6. T2DM 7. CAD (Hx CABG)/EF  20 - 25% mod - severe AS; catheter in right atrium with oscillating density (veg vs thrombus) per 12/8 ECHO -  8. Nutrition - high risk for weight loss - intakeimproving-continue Nepro 9. Deconditioning/right hemiparesis - CIR adm 10. Dizziness with movement - for vestibular work up    LOS: [place] @TODAY @11 :03 AM

## 2019-08-06 NOTE — Progress Notes (Signed)
[facility] PHYSICAL MEDICINE & REHABILITATION PROGRESS NOTE   Subjective/Complaints:  No nausea or vomiting Appreciate IM consult Pt awake and alert today, discussed need for equipment at home    ROS: Denies CP, SOB, N/V.  Objective:   DG Chest 2 View  Result Date: 08/04/2019 CLINICAL DATA:  Patient is lethargic. EXAM: CHEST - 2 VIEW COMPARISON:  None. FINDINGS: Stable cardiomegaly. The hila and mediastinum are unchanged. Vascular calcifications are noted. A double lumen central line terminates in the right side of the atrium, stable. No nodule, mass, infiltrate, or overt edema. IMPRESSION: No active cardiopulmonary disease. Stable double lumen central line terminating in the right side of the atrium. Electronically Signed   By: Dorise Bullion III M.D   On: 08/04/2019 12:08   DG Abd 1 View  Result Date: 08/05/2019 CLINICAL DATA:  Ileus. EXAM: ABDOMEN - 1 VIEW COMPARISON:  08/04/2019.  CT 08/01/2019. FINDINGS: Persistent distention of small and large bowel loops, improved from prior exam. Findings consistent improving adynamic ileus. Pelvic calcifications most consistent with phleboliths. Aortoiliac and visceral atherosclerotic vascular calcification. Degenerative change lumbar spine and both hips. IMPRESSION: Persistent distention of small large bowel loops, improved from prior exam. Findings consistent with improving adynamic ileus. Electronically Signed   By: Marcello Moores  Register   On: 08/05/2019 06:19   DG Abd 1 View  Result Date: 08/04/2019 CLINICAL DATA:  Abdominal distension. EXAM: ABDOMEN - 1 VIEW COMPARISON:  August 01, 2019 FINDINGS: Mild prominence of large and small bowel suggest the possibility of developing ileus. Moderate fecal loading in the left colon. Evaluation for free air is limited on supine imaging but none is seen. Sign IMPRESSION: Air-filled prominent loops of large and small bowel suggests developing ileus. Recommend attention on follow-up. Moderate fecal loading.  No other acute abnormalities. Electronically Signed   By: Dorise Bullion III M.D   On: 08/04/2019 13:41   Recent Labs    08/04/19 1025 08/05/19 0203  WBC 11.9* 12.4*  HGB 9.8* 9.3*  HCT 30.1* 28.2*  PLT 157 141*   Recent Labs    08/04/19 1025  NA 130*  K 3.5  CL 94*  CO2 21*  GLUCOSE 155*  BUN 80*  CREATININE 5.36*  CALCIUM 8.7*    Intake/Output Summary (Last 24 hours) at 08/06/2019 0857 Last data filed at 08/05/2019 2331 Gross per 24 hour  Intake 240 ml  Output 1000 ml  Net -760 ml     Physical Exam: Vital Signs Blood pressure (!) 117/55, pulse 84, temperature 98.4 F (36.9 C), temperature source Oral, resp. rate 18, height 5\' 8"  (1.727 m), weight 53.7 kg, SpO2 97 %. Constitutional: No distress . Vital signs reviewed.  Frail. HENT: Normocephalic.  Atraumatic. Eyes: EOMI. No discharge. Cardiovascular: No JVD. Respiratory: Normal effort.  No stridor. GI: Mild distention.  Skin: Warm and dry.  Intact. Psych: Normal mood.  Normal behavior. Musc: No edema in extremities.  No tenderness in extremities. Neurological: Alert Motor: RUE/RLE: 0/5 proximal distal, unchanged, pt states she has normal sensation  LUE: 4 -/5 proximal to distal LLE: 3 -/5 proximal to distal  Assessment/Plan: 1. 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  Physiatrist is providing close team supervision and 24 hour management of active medical problems listed below.  Physiatrist and rehab team continue to assess barriers to discharge/monitor patient progress toward functional and medical goals  Care Tool:  Bathing    Body  parts bathed by patient: Right arm, Face, Chest   Body parts bathed by helper: Left arm, Abdomen(did not complete LB)     Bathing assist Assist Level: Total Assistance - Patient < 25%(to maintain sitting balance at EOB)     Upper Body  Dressing/Undressing Upper body dressing   What is the patient wearing?: Hospital gown only    Upper body assist Assist Level: Maximal Assistance - Patient 25 - 49%    Lower Body Dressing/Undressing Lower body dressing      What is the patient wearing?: Incontinence brief     Lower body assist Assist for lower body dressing: Dependent - Patient 0%(supine rolling)     Toileting Toileting    Toileting assist Assist for toileting: Dependent - Patient 0%     Transfers Chair/bed transfer  Transfers assist     Chair/bed transfer assist level: 2 Helpers     Locomotion Ambulation   Ambulation assist   Ambulation activity did not occur: Safety/medical concerns(poor postural control and decreased standing ability)          Walk 10 feet activity   Assist  Walk 10 feet activity did not occur: Safety/medical concerns        Walk 50 feet activity   Assist Walk 50 feet with 2 turns activity did not occur: Safety/medical concerns         Walk 150 feet activity   Assist Walk 150 feet activity did not occur: Safety/medical concerns         Walk 10 feet on uneven surface  activity   Assist Walk 10 feet on uneven surfaces activity did not occur: Safety/medical concerns         Wheelchair     Assist Will patient use wheelchair at discharge?: Yes Type of Wheelchair: Manual    Wheelchair assist level: Dependent - Patient 0% Max wheelchair distance: 150 ft    Wheelchair 50 feet with 2 turns activity    Assist        Assist Level: Dependent - Patient 0%   Wheelchair 150 feet activity     Assist      Assist Level: Dependent - Patient 0%   Blood pressure (!) 117/55, pulse 84, temperature 98.4 F (36.9 C), temperature source Oral, resp. rate 18, height 5\' 8"  (1.727 m), weight 53.7 kg, SpO2 97 %.  Medical Problem List and Plan: 1.Right hemiplegiasecondary to left ACA territory infarctionas well as history of right thalamic  CVA 2018  Continue CIR, 15/7  Will ask IM to see for consult given multiple medical issues Team conf in am , as discussed with PT today, will need hsopital bed, motorized chair as well as lift to enable family to care for pt at home   2. Antithrombotics: -DVT/anticoagulation:SCDs -antiplatelet therapy: Aspirin 81 mg daily and Plavix 75 mg daily x3 weeks then aspirin alone 3. Pain Management:Tylenol as needed 4. Mood:Xanax 1 mg nightly -antipsychotic agents: N/A 5. Neuropsych: This patientiscapable of making decisions on herown behalf. 6. Skin/Wound Care:Routine skin checks -local skin care for abrasions -nutrition discussed  -benadryl prn for itching  -sarna lotion prn 7. Fluids/Electrolytes/Nutrition:Routine and outs 8.  ESRD. Hemodialysis per renal services. HD after therapy day to allow for better therapy participation 9. Orthostasis. Continue ProAmatine 10 mg Monday Wednesday Friday  Blood pressure soft, but improved on 12/20 10. Hyperlipidemia. Crestor 11. Acute on chronic combined congestive heart failure. Monitor for any signs of fluid overload. Daily weights  TEE suggesting EF of 35% with  mild MR, TR, AS Filed Weights   08/05/19 1300 08/05/19 1723 08/06/19 0501  Weight: 54.4 kg 53.2 kg 53.7 kg    Weights trending up on 12/20 12. Diabetes mellitus type II with hyperglycemia, hemoglobin A1c 5.3.   Continue SSI  Extremely labile on 12/20 13. History of tobacco abuse. Counseling 14. CAD with CABG 2010. Continue aspirin. No chest pain reported. 15. IBS/history of GI bleed. hold Amitiza24 mcg daily - due to diarrhea  16.  Social son threatened to break through security to visit his mom  35.  Chronic cystitis  Seen by urology recommending Rocephin 2 g daily 18.  Diarrhea- C diff neg, likely Amitiza took this every other day at home to avoid diarrhea will hold until stools are reduced  - liquid stool x 2  this am   Continue rectal tube  Will monitor closely with antibiotics 19.  Vaginal d/c ? Liquid stool vs infected urine: ?GYN eval 20. Fever x 1 with leukocytosis, on vanc and Zosyn, has had uro eval to r/o cystovaginal fistula, awaiting gyne eval to look for rectovag fistula    Xanax DC'd- appears brighter   Fluid status per Nephro given HD   Patient oliguric-ESRD    Chest x-ray ordered- no evidence of infiltrate   LOS: 10 days A FACE TO FACE EVALUATION WAS PERFORMED  Charlett Blake 08/06/2019, 8:57 AM

## 2019-08-06 NOTE — Progress Notes (Signed)
Physical Therapy Session Note  Patient Details  Name: [name] MRN: [id number] Date of Birth: 1941/07/13  Today's Date: 08/06/2019 PT Individual Time: 0802-0830 PT Individual Time Calculation (min): 28 min    Short Term Goals: Week 2:  PT Short Term Goal 1 (Week 2): Pt will perform slideboard transfer with max assist +2 PT Short Term Goal 2 (Week 2): Pt will maintain static sitting balance with CGA PT Short Term Goal 3 (Week 2): Pt will use standing lift equipment for LE weightbearing x 8 minutes  Skilled Therapeutic Interventions/Progress Updates:    Pt supine in bed upon PT arrival, agreeable to therapy tx and reports pain in buttocks 5/10. Pt performed rolling in each direction with max-total assist and use of bedrails in order to don pants total assist. Pt transferred to sitting EOB total assist, in sitting EOB worked on static sitting balance and midline orientation, initially requiring max assist upon initial sitting but able to maintain static sitting with min assist, cues for weightshifting. Pt used beasy board this session to minimize shear forces on buttocks with slideboard transfer, total +2 assist for beasy board transfer to TIS w/c. Once up in the w/c, therapist and patient discussed discharge planning including recommendations for hospital bed, hoyer lift and power w/c or tilt in space w/c. Left sitting up, needs in reach and chair alarm set.   Therapy Documentation Precautions:  Precautions Precautions: Fall Precaution Comments: mild Rt inattention; R hemiplegia; flexi-seal fecal management Restrictions Weight Bearing Restrictions: No    Therapy/Group: Individual Therapy Netta Corrigan, PT, DPT, CSRS 08/06/19  8:56 AM   [name] 08/06/2019, 7:59 AM

## 2019-08-06 NOTE — Progress Notes (Signed)
Occupational Therapy Session Note  Patient Details  Name: [name] MRN: [id number] Date of Birth: 05-Jul-1941  Today's Date: 08/06/2019 OT Individual Time: 0917-1006 OT Individual Time Calculation (min): 49 min    Short Term Goals: Week 2:  OT Short Term Goal 1 (Week 2): Pt will be able to sit EOB with mod A for balance to prepare for her ability to sit unsupported on a toilet. OT Short Term Goal 2 (Week 2): Pt will be able to use a slide board with mod A of 1 bed to w/c to prepare for her ability to do toilet transfers. OT Short Term Goal 3 (Week 2): Pt will demonstrate improved awareness of RUE with self ROM with min A and min cues.  Skilled Therapeutic Interventions/Progress Updates:    Pt in the tilt in wheelchair to start session.  She agreed to work on cleaning up some from the wheelchair and replacing her hospital gown with a shirt.  She already had pants in place from the PT session earlier.  Therapist tilted chair up to work on sitting balance.  While having pt reach forward for the washcloth with the LUE emphasized weight shift to the left through the hips and follow-through with trunk.  Pt maintains flexed trunk and head during reaching with increased LOB to the right at times.  Once she grasped the washcloth and came back to midline for washing she needed max assist to maintain sitting balance secondary to loss of balance to the right.  She needed max assist for donning pullover shirt in the same position with max instructional cueing for following hem dressing techniques.  Had her complete sit to stand and standing for approximately one minute in order to have pressure relief on her buttocks.  Max assist to complete and maintain with slight pushing to the right side noted.  Pt left in tilt in space wheelchair reclined back with spouse in room as well and safety belt and call button in place.    Therapy Documentation Precautions:  Precautions Precautions: Fall Precaution  Comments: right hemiplegia Restrictions Weight Bearing Restrictions: No  Pain: Pain Assessment Pain Scale: Faces Pain Score: 0-No pain ADL: See Care Tool Section for some details of selfcare and mobility  Therapy/Group: Individual Therapy  Katlynne Mckercher OTR/L 08/06/2019, 12:30 PM

## 2019-08-07 ENCOUNTER — Inpatient Hospital Stay (HOSPITAL_COMMUNITY): Payer: Medicare HMO | Admitting: Speech Pathology

## 2019-08-07 ENCOUNTER — Inpatient Hospital Stay (HOSPITAL_COMMUNITY): Payer: Medicare HMO | Admitting: Physical Therapy

## 2019-08-07 ENCOUNTER — Inpatient Hospital Stay (HOSPITAL_COMMUNITY): Payer: Medicare HMO

## 2019-08-07 DIAGNOSIS — I953 Hypotension of hemodialysis: Secondary | ICD-10-CM

## 2019-08-07 DIAGNOSIS — B9689 Other specified bacterial agents as the cause of diseases classified elsewhere: Secondary | ICD-10-CM

## 2019-08-07 LAB — CBC WITH DIFFERENTIAL/PLATELET
Abs Immature Granulocytes: 0.06 10*3/uL (ref 0.00–0.07)
Basophils Absolute: 0 10*3/uL (ref 0.0–0.1)
Basophils Relative: 0 %
Eosinophils Absolute: 0.1 10*3/uL (ref 0.0–0.5)
Eosinophils Relative: 2 %
HCT: 31.4 % — ABNORMAL LOW (ref 36.0–46.0)
Hemoglobin: 10.1 g/dL — ABNORMAL LOW (ref 12.0–15.0)
Immature Granulocytes: 1 %
Lymphocytes Relative: 11 %
Lymphs Abs: 1 10*3/uL (ref 0.7–4.0)
MCH: 33.9 pg (ref 26.0–34.0)
MCHC: 32.2 g/dL (ref 30.0–36.0)
MCV: 105.4 fL — ABNORMAL HIGH (ref 80.0–100.0)
Monocytes Absolute: 1.1 10*3/uL — ABNORMAL HIGH (ref 0.1–1.0)
Monocytes Relative: 12 %
Neutro Abs: 6.9 10*3/uL (ref 1.7–7.7)
Neutrophils Relative %: 74 %
Platelets: 146 10*3/uL — ABNORMAL LOW (ref 150–400)
RBC: 2.98 MIL/uL — ABNORMAL LOW (ref 3.87–5.11)
RDW: 15.9 % — ABNORMAL HIGH (ref 11.5–15.5)
WBC: 9.2 10*3/uL (ref 4.0–10.5)
nRBC: 0 % (ref 0.0–0.2)

## 2019-08-07 LAB — PREPARE RBC (CROSSMATCH)

## 2019-08-07 LAB — GC/CHLAMYDIA PROBE AMP (~~LOC~~) NOT AT ARMC
Chlamydia: NEGATIVE
Comment: NEGATIVE
Comment: NORMAL
Neisseria Gonorrhea: NEGATIVE

## 2019-08-07 LAB — CBC
HCT: 24.4 % — ABNORMAL LOW (ref 36.0–46.0)
HCT: 21.4 % — ABNORMAL LOW (ref 36.0–46.0)
Hemoglobin: 7.6 g/dL — ABNORMAL LOW (ref 12.0–15.0)
Hemoglobin: 6.8 g/dL — CL (ref 12.0–15.0)
MCH: 33.5 pg (ref 26.0–34.0)
MCH: 33.7 pg (ref 26.0–34.0)
MCHC: 31.1 g/dL (ref 30.0–36.0)
MCHC: 31.8 g/dL (ref 30.0–36.0)
MCV: 107.5 fL — ABNORMAL HIGH (ref 80.0–100.0)
MCV: 105.9 fL — ABNORMAL HIGH (ref 80.0–100.0)
Platelets: 163 10*3/uL (ref 150–400)
Platelets: 150 10*3/uL (ref 150–400)
RBC: 2.27 MIL/uL — ABNORMAL LOW (ref 3.87–5.11)
RBC: 2.02 MIL/uL — ABNORMAL LOW (ref 3.87–5.11)
RDW: 16.1 % — ABNORMAL HIGH (ref 11.5–15.5)
RDW: 16.1 % — ABNORMAL HIGH (ref 11.5–15.5)
WBC: 10.3 10*3/uL (ref 4.0–10.5)
WBC: 8 10*3/uL (ref 4.0–10.5)
nRBC: 0 % (ref 0.0–0.2)
nRBC: 0 % (ref 0.0–0.2)

## 2019-08-07 LAB — RENAL FUNCTION PANEL
Albumin: 1.7 g/dL — ABNORMAL LOW (ref 3.5–5.0)
Anion gap: 11 (ref 5–15)
BUN: 68 mg/dL — ABNORMAL HIGH (ref 8–23)
CO2: 24 mmol/L (ref 22–32)
Calcium: 8 mg/dL — ABNORMAL LOW (ref 8.9–10.3)
Chloride: 92 mmol/L — ABNORMAL LOW (ref 98–111)
Creatinine, Ser: 4.22 mg/dL — ABNORMAL HIGH (ref 0.44–1.00)
GFR calc Af Amer: 11 mL/min — ABNORMAL LOW (ref >60)
GFR calc non Af Amer: 10 mL/min — ABNORMAL LOW (ref >60)
Glucose, Bld: 249 mg/dL — ABNORMAL HIGH (ref 70–99)
Phosphorus: 3.6 mg/dL (ref 2.5–4.6)
Potassium: 4.7 mmol/L (ref 3.5–5.1)
Sodium: 127 mmol/L — ABNORMAL LOW (ref 135–145)

## 2019-08-07 LAB — URINE CULTURE: Culture: >=100000 — AB

## 2019-08-07 LAB — GLUCOSE, CAPILLARY
Glucose-Capillary: 136 mg/dL — ABNORMAL HIGH (ref 70–99)
Glucose-Capillary: 236 mg/dL — ABNORMAL HIGH (ref 70–99)
Glucose-Capillary: 129 mg/dL — ABNORMAL HIGH (ref 70–99)

## 2019-08-07 MED ORDER — ALPRAZOLAM 0.25 MG PO TABS
0.2500 mg | ORAL_TABLET | Freq: Every evening | ORAL | Status: DC | PRN
  Administered 2019-08-13 – 2019-08-23 (×8): 0.25 mg via ORAL
  Filled 2019-08-07 (×8): qty 1

## 2019-08-07 MED ORDER — AMOXICILLIN-POT CLAVULANATE 875-125 MG PO TABS: 1.0000 | ORAL_TABLET | Freq: Two times a day (BID) | ORAL | Status: DC

## 2019-08-07 MED ORDER — PENTAFLUOROPROP-TETRAFLUOROETH EX AERO: 1.0000 "application " | INHALATION_SPRAY | CUTANEOUS | Status: DC | PRN

## 2019-08-07 MED ORDER — HEPARIN SODIUM (PORCINE) 1000 UNIT/ML IJ SOLN
INTRAMUSCULAR | Status: AC
  Administered 2019-08-07: 3800 [IU] via INTRAVENOUS_CENTRAL
  Filled 2019-08-07: qty 4

## 2019-08-07 MED ORDER — SODIUM CHLORIDE 0.9% IV SOLUTION: Freq: Once | INTRAVENOUS | Status: AC

## 2019-08-07 MED ORDER — SODIUM CHLORIDE 0.9 % IV SOLN: 100.0000 mL | INTRAVENOUS | Status: DC | PRN

## 2019-08-07 MED ORDER — SODIUM CHLORIDE 0.9 % IV SOLN
8.0000 mg/h | INTRAVENOUS | Status: AC
  Administered 2019-08-07 – 2019-08-10 (×6): 8 mg/h via INTRAVENOUS
  Filled 2019-08-07 (×8): qty 80

## 2019-08-07 MED ORDER — ALTEPLASE 2 MG IJ SOLR: 2.0000 mg | Freq: Once | INTRAMUSCULAR | Status: DC | PRN

## 2019-08-07 MED ORDER — DARBEPOETIN ALFA 60 MCG/0.3ML IJ SOSY
PREFILLED_SYRINGE | INTRAMUSCULAR | Status: AC
  Filled 2019-08-07: qty 0.3

## 2019-08-07 MED ORDER — HEPARIN SODIUM (PORCINE) 1000 UNIT/ML DIALYSIS
1000.0000 [IU] | INTRAMUSCULAR | Status: DC | PRN
  Filled 2019-08-07 (×2): qty 1

## 2019-08-07 MED ORDER — LIDOCAINE HCL (PF) 1 % IJ SOLN
5.0000 mL | INTRAMUSCULAR | Status: DC | PRN
  Filled 2019-08-07: qty 5

## 2019-08-07 MED ORDER — ALBUMIN HUMAN 25 % IV SOLN
INTRAVENOUS | Status: AC
  Administered 2019-08-07: 14:00:00 25 g
  Filled 2019-08-07: qty 100

## 2019-08-07 MED ORDER — DARBEPOETIN ALFA 60 MCG/0.3ML IJ SOSY
60.0000 ug | PREFILLED_SYRINGE | INTRAMUSCULAR | Status: DC
  Administered 2019-08-07: 60 ug via INTRAVENOUS
  Filled 2019-08-07: qty 0.3

## 2019-08-07 MED ORDER — AMOXICILLIN-POT CLAVULANATE 250-125 MG PO TABS
1.0000 | ORAL_TABLET | Freq: Two times a day (BID) | ORAL | Status: DC
  Administered 2019-08-07 – 2019-08-08 (×3): 1 via ORAL
  Filled 2019-08-07 (×4): qty 1

## 2019-08-07 MED ORDER — PANTOPRAZOLE SODIUM 40 MG IV SOLR
40.0000 mg | Freq: Two times a day (BID) | INTRAVENOUS | Status: DC
  Administered 2019-08-11 – 2019-08-12 (×3): 40 mg via INTRAVENOUS
  Filled 2019-08-07 (×5): qty 40

## 2019-08-07 MED ORDER — SIMETHICONE 80 MG PO CHEW: 80.0000 mg | CHEWABLE_TABLET | Freq: Four times a day (QID) | ORAL | Status: DC | PRN

## 2019-08-07 MED ORDER — LIDOCAINE-PRILOCAINE 2.5-2.5 % EX CREA
1.0000 "application " | TOPICAL_CREAM | CUTANEOUS | Status: DC | PRN
  Filled 2019-08-07: qty 5

## 2019-08-07 MED ORDER — DIPHENOXYLATE-ATROPINE 2.5-0.025 MG/5ML PO LIQD: 5.0000 mL | Freq: Four times a day (QID) | ORAL | Status: DC | PRN

## 2019-08-07 MED ORDER — SODIUM CHLORIDE 0.9 % IV SOLN
80.0000 mg | Freq: Once | INTRAVENOUS | Status: AC
  Administered 2019-08-07: 80 mg via INTRAVENOUS
  Filled 2019-08-07: qty 80

## 2019-08-07 NOTE — Progress Notes (Addendum)
This provider spoke with Dr. Collene Mares, she recommended transfusing Ms. Boger with 2 units of PRBC also if she is actively bleeding she recommends obtaining a CTA. She is aware Hospitalist is following. I will call Lovey Newcomer NP regarding the above.  Vitals: BP 82/57 P 84 Sat's 98%.  Spoke with Lovey Newcomer NP at 21:00 regarding the above, she verbalizes understanding. We will continue to monitor.

## 2019-08-07 NOTE — Progress Notes (Signed)
Patient completed HD treatment but no fluid was removed due to low BP. HD PA aware. After given report to primary nurse while waiting for transport, this RN started smelling BM from patient so we decided to clean her. Upon removing her cover it was a large amount of dark red blood about 500 cc, RR paged and the primary nurse was also contacted. The primary nurse said it nothing new, the primary MD and GI MD are all aware of it and that the patient had the same bleeding this morning on the floor.

## 2019-08-07 NOTE — Significant Event (Signed)
Rapid Response Event Note  Overview: GIB   Initial Focused Assessment: Received urgent call from HD staff with concerns of patient having LBRBM. I went up to the HD unit and staff had already called the nurse from [place].  Upon arrival, patient was pale, HR - mild tachycardia, SBP 100-110s, patient was alert, able to follow my commands, RT sided hemiplegia (s/p CVA).  There was significant bright red blood coming from the rectum, EBL at least 300 cc if not more, it had soaked the sheets, her clothes, and the mattress. Per CIR RN, this is not a new change and patient has had ongoing bloody stools since the weekend. She is not overt distress but I am concerned because H/H was 7.6/24 at noon today ( down from 10.1/31). While turning her in bed, patient was quite dizzy and lightheaded, after I sat her up, she stated that she felt better. HR was in the 80s SR upon arrival to 4W. Lung sounds - clear, RR normal.   Interventions: -- STAT CBC -- STAT T&S   Plan of Care:  -- I helped get the back to 4W25, I paged the [facility] MD but I did not receive a call back. CIR RN paged CIR provider and GI provider  -- Monitor VS  -- F/U labs   RRRN to follow as needed.   Event Summary:  Call Time 1738 Arrival Time 1740 End Time 1815  Semaje Kinker R

## 2019-08-07 NOTE — Progress Notes (Addendum)
Received a call from Rozetta Nunnery RN at 6:00 pm regarding drop in Ms. Devoll HGB and while she was in dialysis she had a dark red stool. ED also reports he discussed the above with the REhab medical staff and it was stated medical staff and GI doctors are aware of the above. This provider ordered T&C  and asked if he would placed a call to GI. Dr. Delma Officer was notified awaiting a return call. Hospitalist was also notified, awaiting a return call.   Spoke with Lovey Newcomer NP regarding Ms. Rennels, she will place the order for transfusion. We reviewed Ms. Machain chart we don't see any notes by GI. Will placed a call to Dr. Cristina Gong regarding placing a note in chart per hospitalist NP. Dr. Cristina Gong was called. Dr. Cristina Gong states Dr. Collene Mares is following Ms. Keithly. Will place acall to Dr. Collene Mares.

## 2019-08-07 NOTE — Progress Notes (Signed)
Social Work Patient ID: [name], female   DOB: [birth year], 78 y.o.   MRN: [id number]  [place] with husband via telephone to inform team conference progress toward her goals and then need to downgrade goals. Discharge ate still 1/5 and will need to begin family education, have set up for Tuesday 12/29 for 10:00 with son and husband to begin, may need to come in again before discharge 1/5. Will work on discharge needs. Discussed thought son had a caregiver for pt, had wanted them to come in, but husband voiced it is he and son. Will begin and see how goes.

## 2019-08-07 NOTE — Progress Notes (Signed)
Was asked to consult on pt for LGIB since we are covering unassigned patients.  However, chart review indicated that pt is known to Dr. Collene Mares, so I called Dr. Collene Mares to confirm that, and she checked her office recds and verified that [name] is indeed their patient and has been seen in their office.  Therefore, Dr. Collene Mares kindly agreed to be the GI consultant for this patient.  Please direct any questions concerning this pt's GI management to Dr. Lorie Apley office, [phone].  Cleotis Nipper, M.D. Pager [phone] If no answer or after 5 PM call [phone]

## 2019-08-07 NOTE — Progress Notes (Signed)
[name] called and stated that she was contacting the Hospitalist that has been following this patient and will call back with further instructions.

## 2019-08-07 NOTE — Progress Notes (Signed)
Pt continues to have loose BM through out the night. Pt is concerned about the diarrhea causing further breakdown on her bottom. Dressing changed and barrier cream applied to the bottom.   Pt has skin tear to anterior LLE. Pt had foam dressing on, when dressing was removed skin tear started to bleed. Pressue was applied to area to stop bleeding. Bleeding controlled, Vaseline gauze and foam applied to wound. Pt has skin tear on L hand. Foam was applied to control bleeding.   Pt states she usually takes Xanax while home, which helps her rest at night. Pt states she has been unable to sleep the last few nights and requesting Xanax to help her relax and sleep.

## 2019-08-07 NOTE — Progress Notes (Addendum)
Writer contacted by Dialysis to come lok at patient's stool. It appears dark red as it has been recently. Writer was informed this morning that the Rehab medical staff and GI doctor are aware of this.  Writer contacted on call person Danella Sensing NP) who stated to order a type and cross for blood and to page the GI on call.

## 2019-08-07 NOTE — Progress Notes (Addendum)
Paged GI on call. Dr Delma Officer returned call and is going to contact patient's primary GI doc (DR [facility] ?) in case he wants to be involved.   Dr Felipa Evener, she feels that this is a diverticular bleed and unless the patient's  Hemoglobin drops below 7 she doesn't recommend doing any other treatment at this time. She did say to hold the plavix tonight and discuss this with Dr Read Drivers in the morning.
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
## 2019-08-07 NOTE — Progress Notes (Signed)
Physical Therapy Session Note  Patient Details  Name: [name] MRN: [id number] Date of Birth: 1941-03-28  Today's Date: 08/07/2019 PT Individual Time: 0931-1015 PT Individual Time Calculation (min): 44 min   Short Term Goals: Week 3:     Skilled Therapeutic Interventions/Progress Updates: Patient presents supine in bed agreeable to therapy.  Patientperformed multiple rolls side to side using side rail and total assist, requiring greater assist to right vs left.  Patient assisted w/ donning pants in supine w/ encouragement to pull up pants.  Patient required total assist for sup to sit after assisted to side lying.  Patient does attempt to assist w/ transfers w/ verbal cueing.  Patient assisted to foot-flat on floor and able to maintain seated balance short periods but pushes to right and retropulses.  Patient sat EOB w/ cueing and left UE support to improve seated balance.  Patient assisted to left elbow support to place slide board for max transfers to w/c.  OT present in room for assist as needed for 2 moves to w/c.  Patient max A for scoot backwards into tilt in space w/c w/ blocking right knee.  Patient performed seated activity at sink to brush teeth w/ list to right and verbal cues for improved balance while performing UE activity reaching forward outside of BOS.  Patient left in w/c w/ OT for treatment.     Therapy Documentation Precautions:  Precautions Precautions: Fall Precaution Comments: right hemiplegia Restrictions Weight Bearing Restrictions: No General:   Vital Signs:   Pain:  Patient c/o pain in stomach, but unable to quantify.          Therapy/Group: Individual Therapy  Ladoris Gene 08/07/2019, 11:01 AM

## 2019-08-07 NOTE — Progress Notes (Signed)
PROGRESS NOTE  [name] [id number] DOB: 1941/01/06   PCP: [name], MD  DOA: 07/27/2019 LOS: 61  Brief Narrative / Interim history: 78 y.o. female in [place] unit, with PMH of HTN, HLD, DM-2, systolic CHF, ESRD on HD MWF, CAD/CABG in 2010, AS, right thalamic CVA in 2018 and recent hospitalization for left ACA infarct who has been spiking intermittent fever lately for which [facility] service consulted out of concern for sepsis.  Reportedly had diarrhea last week.  C. difficile was negative on 12/16.  Patient had no respiratory or UTI symptoms but reports "urinary discharge" which has been going on for about a month.  She recently completed treatment for UTI on 12/19 with Keflex.  She also had a dynamic ileus which seems to have improved on x-ray on 12/21.  She spiked fever to 102.9 on 12/20 and had a mild leukocytosis that raised concern possible sepsis.  Patient was started on broad-spectrum antibiotics with Zosyn and vancomycin on 12/21.  Blood cultures negative so far.  UA seems to be bloody.  Urine culture with Klebsiella pneumonia and Enterococcus faecalis.  Recent MRSA PCR was negative.  C. difficile negative on 12/16.  Vancomycin discontinued the next day.  Transitioned to p.o. Augmentin based on urine culture and sensitivity for 4 more days.  Subjective: No major events overnight of this morning.  Had multiple bowel movements.  She denies abdominal pain, nausea, vomiting, chest pain or dyspnea.  Objective: Vitals:   08/07/19 1330 08/07/19 1345 08/07/19 1400 08/07/19 1415  BP: (!) 54/25 (!) 74/36 (!) 71/40 (!) 87/42  Pulse: 98 (!) 103 100 92  Resp:      Temp:      TempSrc:      SpO2:      Weight:      Height:        Intake/Output Summary (Last 24 hours) at 08/07/2019 1508 Last data filed at 08/07/2019 0900 Gross per 24 hour  Intake 370 ml  Output 500 ml  Net -130 ml   Filed Weights   08/06/19 0501 08/07/19 0251 08/07/19 1219  Weight: 53.7 kg 58.1 kg 55.4 kg     Examination:  GENERAL: No acute distress.  Appears well.  HEENT: MMM.  Vision and hearing grossly intact.  NECK: Supple.  No apparent JVD.  RESP:  No IWOB. Good air movement bilaterally. CVS:  RRR. Heart sounds normal.  ABD/GI/GU: Bowel sounds present. Soft. Non tender.  MSK/EXT: No apparent deformity or edema.  SKIN: no apparent skin lesion or wound NEURO: Awake, alert and oriented appropriately. PSYCH: Calm. Normal affect.  Procedures:  None  Assessment & Plan: Sepsis due to Klebsiella pneumonia and Enterococcus faecalis UTI: Sepsis physiology resolved.  Last fever on 08/04/19. C. difficile, blood culture and MRSA PCR negative.  Wet prep negative. -Discontinue vancomycin 12/22 -Zosyn 12/20-12/23.  P.o. Augmentin for 4 more days. -Continue Florastor  Adynamic ileus: Repeat KUB consistent with ileus although she had multiple loose bowel movements.  Recent C. difficile test negative.  Abdominal exam benign. -Discontinue Reglan. -Aggressive mobilization. -Advance to soft diet. -KUB as needed.  Hypotension: On midodrine MWF  Other medical conditions including hypertension, CVA, diastolic CHF, ESRD, DM-2 and CAD stable.   Pressure Injury 05/15/18 Stage I -  Intact skin with non-blanchable redness of a localized area usually over a bony prominence. (Active)  05/15/18 1959  Location: Sacrum  Location Orientation:   Staging: Stage I -  Intact skin with non-blanchable redness of a localized area  usually over a bony prominence.  Wound Description (Comments):   Present on Admission: Yes     Pressure Injury 07/23/19 Buttocks Right Stage II -  Partial thickness loss of dermis presenting as a shallow open ulcer with a red, pink wound bed without slough. (Active)  07/23/19 1345  Location: Buttocks  Location Orientation: Right  Staging: Stage II -  Partial thickness loss of dermis presenting as a shallow open ulcer with a red, pink wound bed without slough.  Wound Description  (Comments):   Present on Admission: Yes     Nutrition Problem: Increased nutrient needs Etiology: chronic illness(ESRD on HD, CHF)  Signs/Symptoms: estimated needs  Interventions: MVI, Prostat, Nepro shake  Code Status: Full code Family Communication: Updated patient's husband at bedside. Disposition Plan: Per primary. Consultants: We are   Microbiology summarized: 12/16-C. difficile negative. 12/12-MRSA PCR negative. 12/20-blood cultures negative so far. 12/20-urine culture Klebsiella pneumonia and Enterococcus faecalis   Sch Meds:  Scheduled Meds: . amoxicillin-clavulanate  1 tablet Oral Q12H  . aspirin EC  81 mg Oral Daily  . Chlorhexidine Gluconate Cloth  6 each Topical Q0600  . Chlorhexidine Gluconate Cloth  6 each Topical Q0600  . clopidogrel  75 mg Oral Daily  . darbepoetin (ARANESP) injection - DIALYSIS  60 mcg Intravenous Q Wed-HD  . feeding supplement  1 Container Oral TID BM  . feeding supplement (NEPRO CARB STEADY)  237 mL Oral TID BM  . feeding supplement (PRO-STAT SUGAR FREE 64)  30 mL Oral BID  . folic acid  1 mg Oral Daily  . Gerhardt's butt cream   Topical QID  . insulin aspart  0-9 Units Subcutaneous TID WC  . miconazole nitrate   Topical BID  . midodrine  10 mg Oral Q M,W,F-HD  . multivitamin  1 tablet Oral QHS  . rosuvastatin  20 mg Oral q1800  . saccharomyces boulardii  250 mg Oral BID  . sevelamer carbonate  1,600 mg Oral TID WC  . vitamin B-12  500 mcg Oral Daily   Continuous Infusions: . sodium chloride 100 mL (08/05/19 2238)  . sodium chloride    . sodium chloride    . sodium chloride     PRN Meds:.sodium chloride, sodium chloride, sodium chloride, sodium chloride, acetaminophen **OR** acetaminophen (TYLENOL) oral liquid 160 mg/5 mL **OR** acetaminophen, ALPRAZolam, alteplase, camphor-menthol, diphenhydrAMINE, heparin, lidocaine (PF), lidocaine-prilocaine, ondansetron, pentafluoroprop-tetrafluoroeth, simethicone,  sorbitol  Antimicrobials: Anti-infectives (From admission, onward)   Start     Dose/Rate Route Frequency Ordered Stop   08/07/19 0815  amoxicillin-clavulanate (AUGMENTIN) 875-125 MG per tablet 1 tablet  Status:  Discontinued     1 tablet Oral Every 12 hours 08/07/19 0811 08/07/19 0814   08/07/19 0815  amoxicillin-clavulanate (AUGMENTIN) 250-125 MG per tablet 1 tablet     1 tablet Oral Every 12 hours 08/07/19 0814 08/11/19 0759   08/05/19 1423  vancomycin (VANCOCIN) 500-5 MG/100ML-% IVPB    Note to Pharmacy: Ronny Bacon  : cabinet override      08/05/19 1423 08/05/19 1639   08/05/19 1200  vancomycin (VANCOREADY) IVPB 500 mg/100 mL  Status:  Discontinued     500 mg 100 mL/hr over 60 Minutes Intravenous Every M-W-F (Hemodialysis) 08/04/19 2145 08/06/19 1302   08/04/19 2200  vancomycin (VANCOREADY) IVPB 1250 mg/250 mL     1,250 mg 166.7 mL/hr over 90 Minutes Intravenous  Once 08/04/19 2145 08/04/19 2343   08/04/19 2200  piperacillin-tazobactam (ZOSYN) IVPB 2.25 g  Status:  Discontinued  2.25 g 100 mL/hr over 30 Minutes Intravenous Every 8 hours 08/04/19 2145 08/07/19 0811   08/02/19 0630  cefTRIAXone (ROCEPHIN) 2 g in sodium chloride 0.9 % 100 mL IVPB  Status:  Discontinued     2 g 200 mL/hr over 30 Minutes Intravenous Daily 08/02/19 0534 08/04/19 2145   08/01/19 0815  cephALEXin (KEFLEX) capsule 250 mg  Status:  Discontinued     250 mg Oral Every 12 hours 08/01/19 0804 08/02/19 0534       I have personally reviewed the following labs and images: CBC: Recent Labs  Lab 08/01/19 0637 08/02/19 1510 08/04/19 1025 08/05/19 0203 08/07/19 0607 08/07/19 1237  WBC 11.1* 9.4 11.9* 12.4* 9.2 10.3  NEUTROABS 7.7  --   --  9.2* 6.9  --   HGB 11.4* 9.8* 9.8* 9.3* 10.1* 7.6*  HCT 35.9* 31.0* 30.1* 28.2* 31.4* 24.4*  MCV 107.5* 107.6* 104.5* 105.2* 105.4* 107.5*  PLT 153 148* 157 141* 146* 163   BMP &GFR Recent Labs  Lab 08/02/19 1510 08/04/19 1025 08/07/19 1237  NA 128*  130* 127*  K 3.9 3.5 4.7  CL 89* 94* 92*  CO2 24 21* 24  GLUCOSE 273* 155* 249*  BUN 86* 80* 68*  CREATININE 5.88* 5.36* 4.22*  CALCIUM 8.3* 8.7* 8.0*  PHOS 3.1  --  3.6   Estimated Creatinine Clearance: 9.8 mL/min (A) (by C-G formula based on SCr of 4.22 mg/dL (H)). Liver & Pancreas: Recent Labs  Lab 08/02/19 1510 08/04/19 1025 08/07/19 1237  AST  --  22  --   ALT  --  17  --   ALKPHOS  --  77  --   BILITOT  --  0.5  --   PROT  --  5.9*  --   ALBUMIN 2.2* 2.2* 1.7*   No results for input(s): LIPASE, AMYLASE in the last 168 hours. No results for input(s): AMMONIA in the last 168 hours. Diabetic: No results for input(s): HGBA1C in the last 72 hours. Recent Labs  Lab 08/06/19 1136 08/06/19 1634 08/06/19 2127 08/07/19 0613 08/07/19 1138  GLUCAP 261* 199* 229* 136* 236*   Cardiac Enzymes: No results for input(s): CKTOTAL, CKMB, CKMBINDEX, TROPONINI in the last 168 hours. No results for input(s): PROBNP in the last 8760 hours. Coagulation Profile: No results for input(s): INR, PROTIME in the last 168 hours. Thyroid Function Tests: No results for input(s): TSH, T4TOTAL, FREET4, T3FREE, THYROIDAB in the last 72 hours. Lipid Profile: No results for input(s): CHOL, HDL, LDLCALC, TRIG, CHOLHDL, LDLDIRECT in the last 72 hours. Anemia Panel: No results for input(s): VITAMINB12, FOLATE, FERRITIN, TIBC, IRON, RETICCTPCT in the last 72 hours. Urine analysis:    Component Value Date/Time   COLORURINE BROWN (A) 08/04/2019 1117   APPEARANCEUR TURBID (A) 08/04/2019 1117   LABSPEC  08/04/2019 1117    TEST NOT REPORTED DUE TO COLOR INTERFERENCE OF URINE PIGMENT   PHURINE  08/04/2019 1117    TEST NOT REPORTED DUE TO COLOR INTERFERENCE OF URINE PIGMENT   GLUCOSEU (A) 08/04/2019 1117    TEST NOT REPORTED DUE TO COLOR INTERFERENCE OF URINE PIGMENT   HGBUR (A) 08/04/2019 1117    TEST NOT REPORTED DUE TO COLOR INTERFERENCE OF URINE PIGMENT   BILIRUBINUR (A) 08/04/2019 1117    TEST  NOT REPORTED DUE TO COLOR INTERFERENCE OF URINE PIGMENT   KETONESUR (A) 08/04/2019 1117    TEST NOT REPORTED DUE TO COLOR INTERFERENCE OF URINE PIGMENT   PROTEINUR (A) 08/04/2019 1117  TEST NOT REPORTED DUE TO COLOR INTERFERENCE OF URINE PIGMENT   UROBILINOGEN 0.2 10/14/2008 0612   NITRITE (A) 08/04/2019 1117    TEST NOT REPORTED DUE TO COLOR INTERFERENCE OF URINE PIGMENT   LEUKOCYTESUR (A) 08/04/2019 1117    TEST NOT REPORTED DUE TO COLOR INTERFERENCE OF URINE PIGMENT   Sepsis Labs: Invalid input(s): PROCALCITONIN, [place]  Microbiology: Recent Results (from the past 240 hour(s))  Culture, Urine     Status: Abnormal   Collection Time: 07/31/19 10:05 PM   Specimen: Urine, Clean Catch  Result Value Ref Range Status   Specimen Description URINE, CLEAN CATCH  Final   Special Requests   Final    Normal Performed at [facility], [address] [place], [place] [address]    Culture MULTIPLE SPECIES PRESENT, SUGGEST RECOLLECTION (A)  Final   Report Status 08/01/2019 FINAL  Final  C difficile quick scan w PCR reflex     Status: None   Collection Time: 07/31/19 10:05 PM   Specimen: Urine, Clean Catch; Stool  Result Value Ref Range Status   C Diff antigen NEGATIVE NEGATIVE Final   C Diff toxin NEGATIVE NEGATIVE Final   C Diff interpretation No C. difficile detected.  Final    Comment: Performed at [facility], [place] [address] [place], [place] [address]  Culture, Urine     Status: Abnormal   Collection Time: 08/04/19 11:00 AM   Specimen: Urine, Random  Result Value Ref Range Status   Specimen Description URINE, RANDOM  Final   Special Requests   Final    NONE Performed at [facility], [place] [address], [place], [place] [address]    Culture (A)  Final    >=100,000 COLONIES/mL KLEBSIELLA PNEUMONIAE >=100,000 COLONIES/mL ENTEROCOCCUS FAECALIS    Report Status 08/07/2019 FINAL  Final   Organism ID, Bacteria KLEBSIELLA PNEUMONIAE (A)  Final   Organism ID,  Bacteria ENTEROCOCCUS FAECALIS (A)  Final      Susceptibility   Enterococcus faecalis - MIC*    AMPICILLIN <=2 SENSITIVE Sensitive     NITROFURANTOIN <=16 SENSITIVE Sensitive     VANCOMYCIN 1 SENSITIVE Sensitive     * >=100,000 COLONIES/mL ENTEROCOCCUS FAECALIS   Klebsiella pneumoniae - MIC*    AMPICILLIN >=32 RESISTANT Resistant     CEFAZOLIN <=4 SENSITIVE Sensitive     CEFTRIAXONE <=1 SENSITIVE Sensitive     CIPROFLOXACIN <=0.25 SENSITIVE Sensitive     GENTAMICIN <=1 SENSITIVE Sensitive     IMIPENEM <=0.25 SENSITIVE Sensitive     NITROFURANTOIN 64 INTERMEDIATE Intermediate     TRIMETH/SULFA <=20 SENSITIVE Sensitive     AMPICILLIN/SULBACTAM <=2 SENSITIVE Sensitive     PIP/TAZO <=4 SENSITIVE Sensitive     * >=100,000 COLONIES/mL KLEBSIELLA PNEUMONIAE  Culture, blood (routine x 2)     Status: None (Preliminary result)   Collection Time: 08/04/19 10:43 PM   Specimen: BLOOD  Result Value Ref Range Status   Specimen Description BLOOD LEFT HAND  Final   Special Requests   Final    BOTTLES DRAWN AEROBIC AND ANAEROBIC Blood Culture results may not be optimal due to an inadequate volume of blood received in culture bottles   Culture   Final    NO GROWTH 3 DAYS Performed at [facility], [address], [place], [place] [address]    Report Status PENDING  Incomplete  Culture, blood (routine x 2)     Status: None (Preliminary result)   Collection Time:  08/04/19 10:46 PM   Specimen: BLOOD  Result Value Ref Range Status   Specimen Description BLOOD LEFT HAND  Final   Special Requests   Final    BOTTLES DRAWN AEROBIC AND ANAEROBIC Blood Culture adequate volume   Culture   Final    NO GROWTH 3 DAYS Performed at [facility], [address], [place], [place] [address]    Report Status PENDING  Incomplete  Wet prep, genital     Status: Abnormal   Collection Time: 08/06/19  6:31 PM   Specimen: Vaginal; Genital  Result Value Ref Range Status   Yeast Wet Prep HPF POC NONE SEEN  NONE SEEN Final   Trich, Wet Prep NONE SEEN NONE SEEN Final   Clue Cells Wet Prep HPF POC NONE SEEN NONE SEEN Final   WBC, Wet Prep HPF POC MANY (A) NONE SEEN Final   Sperm NONE SEEN  Final    Comment: Performed at [facility], [place] [address], [place], [place] [address]    Radiology Studies: DG Abd 1 View  Result Date: 08/07/2019 CLINICAL DATA:  Ileus, upper abdominal pain EXAM: ABDOMEN - 1 VIEW COMPARISON:  08/05/2011 FINDINGS: Mild gaseous distension of the colon with moderate stool burden. Gaseous distention slightly increased since prior study. No organomegaly or free air. Diffuse aortic atherosclerosis. No visible aneurysm. No acute bony abnormality. IMPRESSION: Diffuse gaseous distention of the colon with moderate stool burden. Gaseous distention slightly increased since prior study. Findings likely reflect ileus. Electronically Signed   By: Rolm Baptise M.D.   On: 08/07/2019 08:47      Naomii Kreger T. Sombrillo  If 7PM-7AM, please contact night-coverage [email or web address] Password [facility] 08/07/2019, 3:08 PM

## 2019-08-07 NOTE — Progress Notes (Signed)
Occupational Therapy Session Note  Patient Details  Name: [name] MRN: [id number] Date of Birth: Jun 12, 1941  Today's Date: 08/07/2019 OT Individual Time: 1015-1100 OT Individual Time Calculation (min): 45 min    Short Term Goals: Week 1:  OT Short Term Goal 1 (Week 1): Pt will maintain sitting balance with min assist to prepare for ADL tasks OT Short Term Goal 1 - Progress (Week 1): Not met OT Short Term Goal 2 (Week 1): Pt will complete UB bathing with min assist OT Short Term Goal 2 - Progress (Week 1): Progressing toward goal OT Short Term Goal 3 (Week 1): Pt will complete LB bathing with max assist of 1 caregiver OT Short Term Goal 3 - Progress (Week 1): Not met OT Short Term Goal 4 (Week 1): Pt will complete toilet transfer with max assist of one caregiver OT Short Term Goal 4 - Progress (Week 1): Not met  Skilled Therapeutic Interventions/Progress Updates:    Pt received with pt seated EOB with PT present. Pt completes SBT with +2 A to transfer and reciprocal scoot to back of chair. Pt completes grooming with HOH A to hold toothpaste in RUE and scan R to locate needs in R visual field. Pt completes UB dressing with MOD A for pulling down back and threading RUE. Pt reports liquid stool in brief. Pt SBT back to bed  With total A of 1. Pt completes rolling R and stool noticibly bloody, liquid and malodorous. RN alerted and NT present to help clean up with +2 A rolling in B directions. At end of session NT and RN present as pt had another liquid stool. Pt in bed, exit alarm on and call light in reach  Therapy Documentation Precautions:  Precautions Precautions: Fall Precaution Comments: right hemiplegia Restrictions Weight Bearing Restrictions: No General:   Vital Signs:   Pain:   ADL: ADL Eating: Set up Where Assessed-Eating: Chair Upper Body Bathing: Moderate assistance Where Assessed-Upper Body Bathing: Edge of bed Lower Body Bathing: Dependent Where  Assessed-Lower Body Bathing: Bed level Upper Body Dressing: Maximal assistance Where Assessed-Upper Body Dressing: Edge of bed Lower Body Dressing: Dependent(+2) Where Assessed-Lower Body Dressing: Edge of bed Toilet Transfer: Maximal assistance(+2) Toilet Transfer Method: Stand pivot Science writer: Drop arm bedside commode Vision   Perception    Praxis   Exercises:   Other Treatments:     Therapy/Group:Co tx with PT for 10-15 then Individual Therapy 1015-1100  Tonny Branch 08/07/2019, 10:59 AM

## 2019-08-07 NOTE — Progress Notes (Signed)
SLP Cancellation Note  Patient Details Name: [name] MRN: [id number] DOB: 08-22-1940   Cancelled treatment:        Pt missed 30 minutes of skilled ST d/t pt being off unit for x-ray.                                                                                                 Calie Buttrey 08/07/2019, 11:01 AM

## 2019-08-07 NOTE — Patient Care Conference (Signed)
Inpatient RehabilitationTeam Conference and Plan of Care Update Date: 08/07/2019   Time: 10:35 AM    Patient Name: [name]      Medical Record Number: [id number]  Date of Birth: 1941-08-08 Sex: Female         Room/Bed: 4W25C/4W25C-01 Payor Info: Payor: [employer] MEDICARE / Plan: [place] MEDICARE HMO/PPO / Product Type: *No Product type* /    Admit Date/Time:  07/27/2019  6:07 PM  Primary Diagnosis:  <principal problem not specified>  Patient Active Problem List   Diagnosis Date Noted  . Lethargy   . ESRD on dialysis ([place])   . Controlled type 2 diabetes mellitus with hyperglycemia, without long-term current use of insulin ([place])   . Labile blood glucose   . Chronic cystitis   . Diarrhea due to malabsorption   . Small vessel cerebrovascular accident (CVA) ([place]) 07/27/2019  . Stage I pressure ulcer of sacral region 07/25/2019  . TIA (transient ischemic attack) 07/23/2019  . CVA (cerebral vascular accident) ([place]) 07/23/2019  . Volume overload 05/27/2018  . Dyspnea 05/27/2018  . Acute respiratory failure with hypoxia ([place]) 05/27/2018  . Acute respiratory distress 05/27/2018  . Acute encephalopathy 05/27/2018  . Aspiration pneumonia ([place]) 05/27/2018  . Pressure ulcer 05/19/2018  . Acute on chronic combined systolic and diastolic heart failure ([place]) 05/17/2018  . LBBB (left bundle branch block) 05/17/2018  . Aortic stenosis, moderate 05/17/2018  . NSTEMI (non-ST elevated myocardial infarction) ([place]) 05/16/2018  . Symptomatic anemia 05/15/2018  . Demand ischemia ([place]) 05/15/2018  . Protein-calorie malnutrition, severe 04/20/2018  . Pressure injury of skin 04/18/2018  . GI bleed 10/31/2016  . Blood loss anemia 10/31/2016  . AKI (acute kidney injury) ([place]) 10/31/2016  . Stroke ([place]) 09/26/2016  . ESRD (end stage renal disease) on dialysis ([place]) 06/23/2015  . Balance problem 12/22/2013  . Edema 12/28/2011  . CLAUDICATION 11/01/2010  . CAROTID ARTERY STENOSIS 10/29/2009  . AORTIC  STENOSIS 09/17/2009  . Essential hypertension 11/17/2008  . DIASTOLIC HEART FAILURE, CHRONIC 11/17/2008  . Diabetes mellitus type II, non insulin dependent ([place]) 11/12/2008  . Other and unspecified hyperlipidemia 11/12/2008  . Anemia, chronic renal failure 11/12/2008  . Thrombocytopenia ([place]) 11/12/2008  . Anxiety state 11/12/2008  . Coronary atherosclerosis 11/12/2008  . UNSPEC COMBINED SYSTOLIC&DIASTOLIC HEART FAILURE 0000000  . ESOPHAGEAL REFLUX 11/12/2008  . Chronic kidney disease (CKD), stage V ([place]) 11/12/2008  . LOW BACK PAIN, CHRONIC 11/12/2008  . INSOMNIA UNSPECIFIED 11/12/2008    Expected Discharge Date: Expected Discharge Date: 08/20/19  Team Members Present: Physician leading conference: Dr. Alysia Penna Social Worker Present: Ovidio Kin, LCSW Nurse Present: Rozetta Nunnery, RN Case Manager: Karene Fry, RN PT Present: Michaelene Song, PT OT Present: Clyda Greener, OT SLP Present: Stormy Fabian, SLP PPS Coordinator present : Gunnar Fusi, SLP     Current Status/Progress Goal Weekly Team Focus  Bowel/Bladder   pt is incontinent of b/b, LBM 12/23. Pt having multiple type 7 stools cause extreme pain to the pts bottom.  pt will regain continence  Q2h toileting/ PRN   Swallow/Nutrition/ Hydration             ADL's   total to max assist for selfcare tasks supine to sit EOB.  max to total assist for standing balance as well as squat pivot transfers.  LUE flaccid with no active movement  min assist with need downgrading to mod to max likely bed level if going home  selfcare retraining, balance retraining, neuromuscular re-education, pt/family education therapeutic  exercise   Mobility   total assist bed mobility, mod-max assist sitting balance, total assist +2 for slideboard or beasy board transfers to TIS  min assist bed mobility and transfers, mod assist gait 6ft using LRAD, supervision w/c propulsion 135ft  bed mobility, transfers, activity tolerance,  strengthening, standing balance, midline, R NMR, family education and d/c planning   Communication             Safety/Cognition/ Behavioral Observations  Mod A to Max A for basic problem solving  Max A for basic problem solving  basic problem solving, sustained attention   Pain   pt c/o of buttocks pain 5/10.  ppain will be less than 1  Assess pain Qshift/ PNR. PRN meds when requested.   Skin   pt has 2 stage 2 on buttocks, MASD to the groin and butticks.Pt has skin tear to LLE, R Hand, L elbow  prevent further breakdown and free on infection.  assess skin Qshift/ PRN      *See Care Plan and progress notes for long and short-term goals.     Barriers to Discharge  Current Status/Progress Possible Resolutions Date Resolved   Nursing                  PT                    OT                  SLP                SW                Discharge Planning/Teaching Needs:  Husband reports son has a caregiver for home and will need training, both husband and son aware will need 24 hr care at discharge. Numerous questions for MD regarding pt's medical issues      Team Discussion: Fever Sunday, IV abx started, changed to oral meds yesteray, urology and gyn have seen, HD followed by nephrology.  RN - BS up, had coverage, skin fragile, tears easily, diarrhea, stage 2 on bottom.  OT mod A UB bathing, max UB dressing, LB total A, total+2 sit to stand, max A sitting balance, slow progress, goals mod/max A more likely.  PT using BZ board, ?hoyer for transfers, fam may need to use hoyer lift, caregiver needs to come in to practice.  SLP cog mod/max basic prob solving, on full liquids for ileus per MD.  Family wants to take her home.   Revisions to Treatment Plan: N/A     Medical Summary Current Status: no improvement in RUE strength, UTI treated with Abx Weekly Focus/Goal: elevated temp, ileus,  Barriers to Discharge: Hemodialysis;Decreased family/caregiver support   Possible Resolutions to  Barriers: train with Harrel Lemon lift   Continued Need for Acute Rehabilitation Level of Care: The patient requires daily medical management by a physician with specialized training in physical medicine and rehabilitation for the following reasons: Direction of a multidisciplinary physical rehabilitation program to maximize functional independence : Yes Medical management of patient stability for increased activity during participation in an intensive rehabilitation regime.: Yes Analysis of laboratory values and/or radiology reports with any subsequent need for medication adjustment and/or medical intervention. : Yes   I attest that I was present, lead the team conference, and concur with the assessment and plan of the team.   Retta Diones 08/07/2019, 5:44 PM  Team conference was held via web/ teleconference  due to COVID - 19

## 2019-08-07 NOTE — Progress Notes (Signed)
[facility] Progress Note   Dialysis Orders: MWF at [facility] 4[facility], 300/600, EDW 51.5kg, 2K/2.25Ca, TDC, UFP #4, heparin 1000 - Mircera 200 q 2 weeks Right IJ TDC - failed AVF  Assessment/Plan: 1. 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 Adm to CIR 12/12 2. ESRD:MWF.Resume tight heparin Dr. Jonnie Finner spoke w/ Dr Posey Pronto , cath placed CK Vasc in September 2020. HD today last K 3.5 - start on 3 K bath today and reassess; May need added K bath at d/c 3. Hypertension/volume:Permissive HTNinitially, now hypotensive -does not appear to have excess volume, titrate down as tolerated. Midodrine with HD.  4. Anemia:Hgb11.4>9.8 > 10.1 ,- resume ESA - she had been on high dose PTA 5. Metabolic bone disease:Ca/Phos to goal. Continue home Sevelamer as binder.-un beknownst to her dialysis unit, she tells me she was also taking calcitriol at home - iPTH in goal - will d/c calcitriol and switch to TIW hectorol -home calcitriol should not be resumed at d/c 6. T2DM 7. 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 Nutrition - high risk for weight loss - intakeimproving-continue Nepro 9. Deconditioning/right hemiparesis - CIR adm - not sure how she will manage at home    Jeanne Jacobson, PA-C [place] [phone] 08/07/2019,10:21 AM  LOS: 11 days   Subjective:   Working with PT on transfer - little strength with marginal core stability  Objective Vitals:   08/06/19 1523 08/06/19 2019 08/07/19 0251 08/07/19 0600  BP: 124/61 (!) 139/59  138/70  Pulse: 85 85  89  Resp: 16 16  18   Temp: 98.6 F (37 C) 98.4 F (36.9 C)  98.2 F (36.8 C)  TempSrc: Oral Oral  Oral  SpO2: 100% 95%  100%   Weight:   58.1 kg   Height:       Physical Exam General: sitting on side of bed, alert Heart: RRR Lungs: no rales Abdomen: soft Extremities: no LE edema Dialysis Access:  [facility]   Additional Objective Labs: Basic Metabolic Panel: Recent Labs  Lab 07/31/19 1451 08/02/19 1510 08/04/19 1025  NA 130* 128* 130*  K 4.9 3.9 3.5  CL 91* 89* 94*  CO2 24 24 21*  GLUCOSE 383* 273* 155*  BUN 92* 86* 80*  CREATININE 6.82* 5.88* 5.36*  CALCIUM 8.7* 8.3* 8.7*  PHOS 3.2 3.1  --    Liver Function Tests: Recent Labs  Lab 07/31/19 1451 08/02/19 1510 08/04/19 1025  AST  --   --  22  ALT  --   --  17  ALKPHOS  --   --  77  BILITOT  --   --  0.5  PROT  --   --  5.9*  ALBUMIN 2.6* 2.2* 2.2*   No results for input(s): LIPASE, AMYLASE in the last 168 hours. CBC: Recent Labs  Lab 08/01/19 0637 08/02/19 1510 08/04/19 1025 08/05/19 0203 08/07/19 0607  WBC 11.1* 9.4 11.9* 12.4* 9.2  NEUTROABS 7.7  --   --  9.2* 6.9  HGB 11.4* 9.8* 9.8* 9.3* 10.1*  HCT 35.9* 31.0* 30.1* 28.2* 31.4*  MCV 107.5* 107.6* 104.5* 105.2* 105.4*  PLT 153 148* 157 141* 146*   Blood Culture    Component Value Date/Time   SDES BLOOD LEFT HAND 08/04/2019 2246  SPECREQUEST  08/04/2019 2246    BOTTLES DRAWN AEROBIC AND ANAEROBIC Blood Culture adequate volume   CULT  08/04/2019 2246    NO GROWTH 3 DAYS Performed at [facility], [place] [address] [place], [place] [address]    REPTSTATUS PENDING 08/04/2019 2246    Cardiac Enzymes: No results for input(s): CKTOTAL, CKMB, CKMBINDEX, TROPONINI in the last 168 hours. CBG: Recent Labs  Lab 08/06/19 0610 08/06/19 1136 08/06/19 1634 08/06/19 2127 08/07/19 0613  GLUCAP 150* 261* 199* 229* 136*   Iron Studies: No results for input(s): IRON, TIBC, TRANSFERRIN, FERRITIN in the last 72 hours. Lab Results  Component Value Date   INR 1.0 07/23/2019   INR 1.07 04/18/2018   INR 1.4 10/16/2008   Studies/Results: DG Abd 1 View  Result Date:  08/07/2019 CLINICAL DATA:  Ileus, upper abdominal pain EXAM: ABDOMEN - 1 VIEW COMPARISON:  08/05/2011 FINDINGS: Mild gaseous distension of the colon with moderate stool burden. Gaseous distention slightly increased since prior study. No organomegaly or free air. Diffuse aortic atherosclerosis. No visible aneurysm. No acute bony abnormality. IMPRESSION: Diffuse gaseous distention of the colon with moderate stool burden. Gaseous distention slightly increased since prior study. Findings likely reflect ileus. Electronically Signed   By: Rolm Baptise M.D.   On: 08/07/2019 08:47   Medications: . sodium chloride 100 mL (08/05/19 2238)  . sodium chloride     . amoxicillin-clavulanate  1 tablet Oral Q12H  . aspirin EC  81 mg Oral Daily  . Chlorhexidine Gluconate Cloth  6 each Topical Q0600  . Chlorhexidine Gluconate Cloth  6 each Topical Q0600  . clopidogrel  75 mg Oral Daily  . feeding supplement  1 Container Oral TID BM  . feeding supplement (NEPRO CARB STEADY)  237 mL Oral TID BM  . feeding supplement (PRO-STAT SUGAR FREE 64)  30 mL Oral BID  . folic acid  1 mg Oral Daily  . Gerhardt's butt cream   Topical QID  . insulin aspart  0-9 Units Subcutaneous TID WC  . miconazole nitrate   Topical BID  . midodrine  10 mg Oral Q M,W,F-HD  . multivitamin  1 tablet Oral QHS  . rosuvastatin  20 mg Oral q1800  . saccharomyces boulardii  250 mg Oral BID  . sevelamer carbonate  1,600 mg Oral TID WC  . vitamin B-12  500 mcg Oral Daily

## 2019-08-07 NOTE — Progress Notes (Signed)
This nurse spoke to X. [facility].Orders placed for 1 unit of PRBC and protonix. Provider requested to monitor pt, if pt becomes unstable notify the provider for further orders.

## 2019-08-08 ENCOUNTER — Inpatient Hospital Stay (HOSPITAL_COMMUNITY): Payer: Medicare HMO | Admitting: *Deleted

## 2019-08-08 ENCOUNTER — Inpatient Hospital Stay (HOSPITAL_COMMUNITY): Payer: Medicare HMO

## 2019-08-08 ENCOUNTER — Inpatient Hospital Stay (HOSPITAL_COMMUNITY): Payer: Medicare HMO | Admitting: Speech Pathology

## 2019-08-08 LAB — GLUCOSE, CAPILLARY
Glucose-Capillary: 140 mg/dL — ABNORMAL HIGH (ref 70–99)
Glucose-Capillary: 188 mg/dL — ABNORMAL HIGH (ref 70–99)
Glucose-Capillary: 273 mg/dL — ABNORMAL HIGH (ref 70–99)
Glucose-Capillary: 268 mg/dL — ABNORMAL HIGH (ref 70–99)

## 2019-08-08 LAB — CBC
HCT: 27.7 % — ABNORMAL LOW (ref 36.0–46.0)
Hemoglobin: 9.1 g/dL — ABNORMAL LOW (ref 12.0–15.0)
MCH: 30 pg (ref 26.0–34.0)
MCHC: 32.9 g/dL (ref 30.0–36.0)
MCV: 91.4 fL (ref 80.0–100.0)
Platelets: 137 10*3/uL — ABNORMAL LOW (ref 150–400)
RBC: 3.03 MIL/uL — ABNORMAL LOW (ref 3.87–5.11)
RDW: 24.1 % — ABNORMAL HIGH (ref 11.5–15.5)
WBC: 7.8 10*3/uL (ref 4.0–10.5)
nRBC: 0 % (ref 0.0–0.2)

## 2019-08-08 LAB — HEMOGLOBIN AND HEMATOCRIT, BLOOD
HCT: 31.8 % — ABNORMAL LOW (ref 36.0–46.0)
HCT: 29.5 % — ABNORMAL LOW (ref 36.0–46.0)
Hemoglobin: 10.5 g/dL — ABNORMAL LOW (ref 12.0–15.0)
Hemoglobin: 9.6 g/dL — ABNORMAL LOW (ref 12.0–15.0)

## 2019-08-08 MED ORDER — IOHEXOL 350 MG/ML SOLN
100.0000 mL | Freq: Once | INTRAVENOUS | Status: AC | PRN
  Administered 2019-08-08: 100 mL via INTRAVENOUS

## 2019-08-08 MED ORDER — AMOXICILLIN-POT CLAVULANATE 250-125 MG PO TABS
1.0000 | ORAL_TABLET | Freq: Two times a day (BID) | ORAL | Status: AC
  Administered 2019-08-08 – 2019-08-09 (×3): 1 via ORAL
  Filled 2019-08-08 (×3): qty 1

## 2019-08-08 MED ORDER — CHLORHEXIDINE GLUCONATE CLOTH 2 % EX PADS
6.0000 | MEDICATED_PAD | Freq: Every day | CUTANEOUS | Status: DC
  Administered 2019-08-08 – 2019-08-16 (×6): 6 via TOPICAL

## 2019-08-08 MED ORDER — DARBEPOETIN ALFA 150 MCG/0.3ML IJ SOSY
150.0000 ug | PREFILLED_SYRINGE | INTRAMUSCULAR | Status: DC
  Filled 2019-08-08: qty 0.3

## 2019-08-08 NOTE — Consult Note (Signed)
Reason for Consult: Rectal bleeding. Referring Physician: THP  RYN OGRADY is an 78 y.o. female.  HPI: 78 year old white female, with multiple medical problems listed below, on anticoagulation for a left ACA stroke with right sided hemiplegia, started having rectal painless rectal bleeding with a drop in hemoglobin from  10.1 gms/dl o 6.8 gms/dl requiring 2 units of PRC's. She has been having small volume rectal bleeding through the day with mild hypotension. She has had some nausea but denies having any abdominal pain. Her Plavix was held yesterday and the Aspirin was held today. She had a colonoscopy done in 2018 when left sided diverticula were noted. She suffers from chronic constipation.She has been taking Amitiza in the past. Of late,he has had diarrhea and her C. Difficile assay was negative.  Past Medical History:  Diagnosis Date  . Anemia    Pt is taking iron.   [name] Anxiety   . Arthritis   . Carotid stenosis    40-59% bilateral ICA stenosis in 2/12.  . Chronic low back pain   . CKD (chronic kidney disease)    Dr. Audie Clear at [facility]  . Coronary artery disease    Pt presented 2/10 to [facility] with NSTEMI and diastolic CHF exacerbation.  LHC was done  3/10 showing 99% pRCA stenosis and 80% calcified pLAD stenosis with L=>R collaterals.  Pt was referred  for CABG which was done by Dr. Prescott Gum with LIMA-LAD, SVG-RCA, SVG-OM.  . Diabetes mellitus   . Diabetic neuropathy ([place])   . Diastolic CHF (HCC)    Echo (2/10) showed EF 55-65%, mild LVH, diastolic dysfunction, mild AS with mean gradient 12 mmHg, PASP 43 mmHg.  Echo (2/12): EF 55-60%, mild LVH, mild AS (mean gradient 12), PA systolic pressure 32 mmHg.     [name] GERD (gastroesophageal reflux disease)   . Heart murmur   . Hyperlipidemia   . Hypertension   . Mild aortic stenosis    mean gradient 12 mmHg in 2/12.  . Myocardial infarction ([place])    "mild"  . Pneumonia   . PONV (postoperative nausea and vomiting)   .  Stroke [facility])    " mild"  . Thrombocytopenia ([place])   . Unsteady gait    Past Surgical History:  Procedure Laterality Date  . AORTIC ARCH ANGIOGRAPHY N/A 10/18/2018   Procedure: AORTIC ARCH ANGIOGRAPHY;  Surgeon: Marty Heck, MD;  Location: [place] CV LAB;  Service: Cardiovascular;  Laterality: N/A;  . AV FISTULA PLACEMENT Left 01/30/2015   Procedure: Creation of a Radial Cephalic AV Fistula left wrist;  Surgeon: [name], MD;  Location: [place];  Service: Vascular;  Laterality: Left;  . AV FISTULA PLACEMENT Left 09/06/2018   Procedure: LEFT ARM ARTERIOVENOUS (AV) FISTULA CREATION;  Surgeon: Marty Heck, MD;  Location: [place];  Service: Vascular;  Laterality: Left;  . BACK SURGERY     multiple  . BREAST SURGERY     biopsy  . BUBBLE STUDY  08/02/2019   Procedure: BUBBLE STUDY;  Surgeon: Skeet Latch, MD;  Location: [place];  Service: Cardiovascular;;  . CARDIAC CATHETERIZATION    . CATARACT EXTRACTION W/ INTRAOCULAR LENS  IMPLANT, BILATERAL    . COLONOSCOPY Left 11/03/2016   Procedure: COLONOSCOPY;  Surgeon: [name], MD;  Location[facility] ENDOSCOPY;  Service: Endoscopy;  Laterality: Left;  . COLONOSCOPY W/ BIOPSIES AND POLYPECTOMY    . CORONARY ARTERY BYPASS GRAFT  09/2008   pt with NSTEMI and diastolic  CHF exacerbation.  LHC was done  3/10 showing 99% pRCA stenosis and 80% calcified pLAD stenosis with L=>R collaterals.  Pt was referred  for CABG which was done by Dr. Prescott Gum with LIMA-LAD, SVG-RCA, SVG-OM.  [name] ESOPHAGOGASTRODUODENOSCOPY N/A 11/02/2016   Procedure: ESOPHAGOGASTRODUODENOSCOPY (EGD);  Surgeon: [name], MD;  Location: [facility] ENDOSCOPY;  Service: Endoscopy;  Laterality: N/A;  . GIVENS CAPSULE STUDY N/A 11/29/2016   Procedure: GIVENS CAPSULE STUDY;  Surgeon: [name], MD;  Location: [place];  Service: Endoscopy;  Laterality: N/A;  . LIGATION OF ARTERIOVENOUS  FISTULA Left 09/13/2018   Procedure: LIGATION OF LEFT ARTERIOVENOUS  FISTULA;   Surgeon: Waynetta Sandy, MD;  Location: [place];  Service: Vascular;  Laterality: Left;  . REVISON OF ARTERIOVENOUS FISTULA Left 07/02/2015   Procedure: REVISON OF LEFT RADIOCEPHALIC ARTERIOVENOUS FISTULA;  Surgeon: [name], MD;  Location: [place];  Service: Vascular;  Laterality: Left;  . RIGHT/LEFT HEART CATH AND CORONARY/GRAFT ANGIOGRAPHY N/A 05/18/2018   Procedure: RIGHT/LEFT HEART CATH AND CORONARY/GRAFT ANGIOGRAPHY;  Surgeon: Leonie Man, MD;  Location: [place] CV LAB;  Service: Cardiovascular;  Laterality: N/A;  . TEE WITHOUT CARDIOVERSION N/A 08/02/2019   Procedure: TRANSESOPHAGEAL ECHOCARDIOGRAM (TEE);  Surgeon: Skeet Latch, MD;  Location: [place];  Service: Cardiovascular;  Laterality: N/A;  . UPPER EXTREMITY ANGIOGRAPHY Left 10/18/2018   Procedure: UPPER EXTREMITY ANGIOGRAPHY;  Surgeon: Marty Heck, MD;  Location: [place] CV LAB;  Service: Cardiovascular;  Laterality: Left;   Family History  Adopted: Yes  Family history unknown: Yes   Social History:  reports that she has quit smoking. Her smoking use included cigarettes. She has never used smokeless tobacco. She reports current alcohol use. She reports that she does not use drugs.  Allergies:  Allergies  Allergen Reactions  . Doxycycline Nausea And Vomiting    Caused "DEATHLY NAUSEA AND VOMITING"  . Lipitor [Atorvastatin] Other (See Comments)    Stomach pain  . Strawberry Extract Rash   Medications: I have reviewed the patient's current medications.  Results for orders placed or performed during the hospital encounter of 07/27/19 (from the past 48 hour(s))  Glucose, capillary     Status: Abnormal   Collection Time: 08/06/19  4:34 PM  Result Value Ref Range   Glucose-Capillary 199 (H) 70 - 99 mg/dL  GC/Chlamydia probe amp ([place])not at [facility]     Status: None   Collection Time: 08/06/19  5:02 PM  Result Value Ref Range   Neisseria Gonorrhea Negative    Chlamydia Negative     Comment Normal Reference [place] Chlamydia - Negative    Comment      Normal Reference Range Neisseria Gonorrhea - Negative  Wet prep, genital     Status: Abnormal   Collection Time: 08/06/19  6:31 PM   Specimen: Vaginal; Genital  Result Value Ref Range   Yeast Wet Prep HPF POC NONE SEEN NONE SEEN   Trich, Wet Prep NONE SEEN NONE SEEN   Clue Cells Wet Prep HPF POC NONE SEEN NONE SEEN   WBC, Wet Prep HPF POC MANY (A) NONE SEEN   Sperm NONE SEEN     Comment: Performed at [facility] [address], [place], [place] [address]  Glucose, capillary     Status: Abnormal   Collection Time: 08/06/19  9:27 PM  Result Value Ref Range   Glucose-Capillary 229 (H) 70 - 99 mg/dL  CBC with Differential/Platelet     Status: Abnormal  Collection Time: 08/07/19  6:07 AM  Result Value Ref Range   WBC 9.2 4.0 - 10.5 K/uL   RBC 2.98 (L) 3.87 - 5.11 MIL/uL   Hemoglobin 10.1 (L) 12.0 - 15.0 g/dL   HCT 31.4 (L) 36.0 - 46.0 %   MCV 105.4 (H) 80.0 - 100.0 fL   MCH 33.9 26.0 - 34.0 pg   MCHC 32.2 30.0 - 36.0 g/dL   RDW 15.9 (H) 11.5 - 15.5 %   Platelets 146 (L) 150 - 400 K/uL   nRBC 0.0 0.0 - 0.2 %   Neutrophils Relative % 74 %   Neutro Abs 6.9 1.7 - 7.7 K/uL   Lymphocytes Relative 11 %   Lymphs Abs 1.0 0.7 - 4.0 K/uL   Monocytes Relative 12 %   Monocytes Absolute 1.1 (H) 0.1 - 1.0 K/uL   Eosinophils Relative 2 %   Eosinophils Absolute 0.1 0.0 - 0.5 K/uL   Basophils Relative 0 %   Basophils Absolute 0.0 0.0 - 0.1 K/uL   Immature Granulocytes 1 %   Abs Immature Granulocytes 0.06 0.00 - 0.07 K/uL    Comment: Performed at [facility], [address], [place], [place] [address]  Glucose, capillary     Status: Abnormal   Collection Time: 08/07/19  6:13 AM  Result Value Ref Range   Glucose-Capillary 136 (H) 70 - 99 mg/dL  Glucose, capillary     Status: Abnormal   Collection Time: 08/07/19 11:38 AM  Result Value Ref Range   Glucose-Capillary 236 (H) 70 - 99 mg/dL  Renal function  panel     Status: Abnormal   Collection Time: 08/07/19 12:37 PM  Result Value Ref Range   Sodium 127 (L) 135 - 145 mmol/L   Potassium 4.7 3.5 - 5.1 mmol/L   Chloride 92 (L) 98 - 111 mmol/L   CO2 24 22 - 32 mmol/L   Glucose, Bld 249 (H) 70 - 99 mg/dL   BUN 68 (H) 8 - 23 mg/dL   Creatinine, Ser 4.22 (H) 0.44 - 1.00 mg/dL   Calcium 8.0 (L) 8.9 - 10.3 mg/dL   Phosphorus 3.6 2.5 - 4.6 mg/dL   Albumin 1.7 (L) 3.5 - 5.0 g/dL   GFR calc non Af Amer 10 (L) >60 mL/min   GFR calc Af Amer 11 (L) >60 mL/min   Anion gap 11 5 - 15    Comment: Performed at [facility], [place] [address] [place], [place] [address]  CBC     Status: Abnormal   Collection Time: 08/07/19 12:37 PM  Result Value Ref Range   WBC 10.3 4.0 - 10.5 K/uL   RBC 2.27 (L) 3.87 - 5.11 MIL/uL   Hemoglobin 7.6 (L) 12.0 - 15.0 g/dL    Comment: REPEATED TO VERIFY   HCT 24.4 (L) 36.0 - 46.0 %   MCV 107.5 (H) 80.0 - 100.0 fL   MCH 33.5 26.0 - 34.0 pg   MCHC 31.1 30.0 - 36.0 g/dL   RDW 16.1 (H) 11.5 - 15.5 %   Platelets 163 150 - 400 K/uL   nRBC 0.0 0.0 - 0.2 %    Comment: Performed at [facility] [address], [place], [place] [address]  Type and screen [place]     Status: None (Preliminary result)   Collection Time: 08/07/19  7:00 PM  Result Value Ref Range   ABO/RH(D) O NEG    Antibody Screen NEG    Sample Expiration  08/10/2019,2359    Unit Number [id number]    Blood Component Type RED CELLS,LR    Unit division 00    Status of Unit ISSUED,FINAL    Transfusion Status OK TO TRANSFUSE    Crossmatch Result      Compatible Performed at [facility], [place] [address] [place], [place] [address]    Unit Number [id number]    Blood Component Type RED CELLS,LR    Unit division 00    Status of Unit ISSUED    Transfusion Status OK TO TRANSFUSE    Crossmatch Result Compatible   CBC     Status: Abnormal   Collection Time: 08/07/19  7:00 PM  Result Value Ref Range   WBC 8.0 4.0 -  10.5 K/uL   RBC 2.02 (L) 3.87 - 5.11 MIL/uL   Hemoglobin 6.8 (LL) 12.0 - 15.0 g/dL    Comment: REPEATED TO VERIFY THIS CRITICAL RESULT HAS VERIFIED AND BEEN CALLED TO J.KNISLEY RN BY IMANI MANNING ON 12 23 2020 AT 1940, AND HAS BEEN READ BACK.     HCT 21.4 (L) 36.0 - 46.0 %   MCV 105.9 (H) 80.0 - 100.0 fL   MCH 33.7 26.0 - 34.0 pg   MCHC 31.8 30.0 - 36.0 g/dL   RDW 16.1 (H) 11.5 - 15.5 %   Platelets 150 150 - 400 K/uL   nRBC 0.0 0.0 - 0.2 %    Comment: Performed at [place] [address] [place], [place] [address]  Prepare RBC     Status: None   Collection Time: 08/07/19  7:54 PM  Result Value Ref Range   Order Confirmation      ORDER PROCESSED BY BLOOD BANK Performed at [facility], [place] [address], [place], [place] [address]   Prepare RBC     Status: None   Collection Time: 08/07/19  9:03 PM  Result Value Ref Range   Order Confirmation      ORDER PROCESSED BY BLOOD BANK Performed at [facility], [place] [address], [place], [place] [address]   Glucose, capillary     Status: Abnormal   Collection Time: 08/07/19  9:05 PM  Result Value Ref Range   Glucose-Capillary 129 (H) 70 - 99 mg/dL  CBC     Status: Abnormal   Collection Time: 08/08/19  5:33 AM  Result Value Ref Range   WBC 7.8 4.0 - 10.5 K/uL   RBC 3.03 (L) 3.87 - 5.11 MIL/uL   Hemoglobin 9.1 (L) 12.0 - 15.0 g/dL    Comment: REPEATED TO VERIFY POST TRANSFUSION SPECIMEN    HCT 27.7 (L) 36.0 - 46.0 %   MCV 91.4 80.0 - 100.0 fL    Comment: POST TRANSFUSION SPECIMEN   MCH 30.0 26.0 - 34.0 pg   MCHC 32.9 30.0 - 36.0 g/dL   RDW 24.1 (H) 11.5 - 15.5 %   Platelets 137 (L) 150 - 400 K/uL   nRBC 0.0 0.0 - 0.2 %    Comment: Performed at [facility], [place] [address], [place], [place] [address]  Glucose, capillary     Status: Abnormal   Collection Time: 08/08/19  6:23 AM  Result Value Ref Range   Glucose-Capillary 140 (H) 70 - 99 mg/dL  Glucose, capillary     Status: Abnormal   Collection  Time: 08/08/19 11:40 AM  Result Value Ref Range   Glucose-Capillary 188 (H) 70 - 99 mg/dL  Hemoglobin and hematocrit, blood  Status: Abnormal   Collection Time: 08/08/19 12:34 PM  Result Value Ref Range   Hemoglobin 10.5 (L) 12.0 - 15.0 g/dL    Comment: REPEATED TO VERIFY POST TRANSFUSION SPECIMEN    HCT 31.8 (L) 36.0 - 46.0 %    Comment: Performed at [facility] [address], [place], [place] [address]   DG Abd 1 View  Result Date: 08/07/2019 CLINICAL DATA:  Ileus, upper abdominal pain EXAM: ABDOMEN - 1 VIEW COMPARISON:  08/05/2011 FINDINGS: Mild gaseous distension of the colon with moderate stool burden. Gaseous distention slightly increased since prior study. No organomegaly or free air. Diffuse aortic atherosclerosis. No visible aneurysm. No acute bony abnormality. IMPRESSION: Diffuse gaseous distention of the colon with moderate stool burden. Gaseous distention slightly increased since prior study. Findings likely reflect ileus. Electronically Signed   By: Rolm Baptise M.D.   On: 08/07/2019 08:47   Review of Systems  Constitutional: Positive for activity change and fatigue. Negative for appetite change, chills, diaphoresis, fever and unexpected weight change.  HENT: Negative.   Eyes: Negative.   Respiratory: Negative.   Cardiovascular: Negative.   Gastrointestinal: Positive for blood in stool, diarrhea and nausea. Negative for abdominal distention, abdominal pain, anal bleeding, rectal pain and vomiting.  Genitourinary: Negative.   Skin: Positive for pallor. Negative for color change and rash.  Allergic/Immunologic: Negative.   Neurological: Positive for weakness.  Hematological: Bruises/bleeds easily.   Blood pressure (!) 95/55, pulse 89, temperature 97.9 F (36.6 C), temperature source Axillary, resp. rate 16, height 5\' 8"  (1.727 m), weight 55.3 kg, SpO2 95 %. Physical Exam  Constitutional: She is oriented to person, place, and time. She appears well-developed  and well-nourished.  Chronically ill appearing womn in no acute distress with multiple bruises and right hemiparesis  HENT:  Head: Normocephalic and atraumatic.  Eyes: Pupils are equal, round, and reactive to light. Conjunctivae and EOM are normal.  Respiratory: Effort normal and breath sounds normal.  GI: Soft. Bowel sounds are normal.  Musculoskeletal:     Cervical back: Normal range of motion and neck supple.  Neurological: She is alert and oriented to person, place, and time.  Right sided weakness noted on exam  Skin: Skin is warm and dry.  Psychiatric: She has a normal mood and affect. Her behavior is normal. Judgment and thought content normal.   Assessment/Plan: 1) Rectal bleeding/ABLA-I suspect she is having a diverticular bleed; however, as per my discussion with Dr. Gardenia Phlegm, plans are to do a CTA as she continues to bleed.   2) Small vessel disease/s/p left ACA infarct with right hemiplegia-Aspirin and Plavix has been held. I think a palliative care consult might be helpful. 3) ESRD on HD. 4) AODM. 5) CAD/HTN-currently hypotensive.[name] 6) Metabolic bone disease. [name] 08/08/2019, 3:08 PM

## 2019-08-08 NOTE — Plan of Care (Deleted)
Goal added to care plan due to selective attention deficits noted during tx session today 08/08/19

## 2019-08-08 NOTE — Progress Notes (Addendum)
Speech Language Pathology Daily Session Note  Patient Details  Name: [name] MRN: [id number] Date of Birth: 11/14/1940  Today's Date: 08/08/2019 SLP Individual Time: 0830-0930 SLP Individual Time Calculation (min): 60 min  Short Term Goals: Week 2: SLP Short Term Goal 1 (Week 2): Pt will use external aids to recall daily information with Supervision assist verbal cues. SLP Short Term Goal 2 (Week 2): Pt will complete mildly complex tasks with min assist verbal cues for functional problem solving. SLP Short Term Goal 3 (Week 2): Pt will locate items to right of midline with Supervision A verbal/visual cues. SLP Short Term Goal 4 (Week 2): Pt will selectively attend to tasks with Min A verbal cues for redirection.  Skilled Therapeutic Interventions: Pt was seen for skilled ST targeting cognitive goals. Pt was attempting to eat breakfast upon therapist's arrival, however noted that she cannot feed herself. Therefore, SLP provided Max A for self feeding of oatmeal with applesauce. Mod A verbal cues also provided for verbally problem solving what to do if a meal tray is left in front of her that she cannot feed herself (use of call bell to request assistance). Pt required Min A to recall other therapy team names and Supervision A question cue to recall events from last ST session (card task). SLP further facilitated session with Max a faded to Mod A multimodal cues (including using finger to trace along page and a bright sticky note as visual anchor) in order to scan from left to right across a grocery ad to find items (mainly located on right side of page) from a grocery list. Min A verbal cues also provided for organization and selective attention to task throughout activity. Pt continues to report that cognitive tasks presented in ST feel more difficult to her than she believes they should be (based on prior level of functioning), demonstrating good intellectual awareness. Pt left laying in bed  with alarm set, needs within reach. Continue per current plan of care.       Pain Pain Assessment Pain Scale: Faces Faces Pain Scale: No hurt  Therapy/Group: Individual Therapy  [name] 08/08/2019, 9:37 AM

## 2019-08-08 NOTE — Progress Notes (Signed)
Physical Therapy Note  Patient Details  Name: [name] MRN: [id number] Date of Birth: 08-29-1940 Today's Date: 08/08/2019    Missed 60 min skilled PT due to medical hold per MD note. Pt with GI bleed. Will continue when medically cleared.   [name], PTA 08/08/19    [name] 08/08/2019, 10:55 AM

## 2019-08-08 NOTE — Progress Notes (Signed)
[facility] PHYSICAL MEDICINE & REHABILITATION PROGRESS NOTE   Subjective/Complaints: Appreciate GI, IM notes.  Discussed with IM this am .   ROS: Denies CP, SOB, N/V.  Objective:   DG Abd 1 View  Result Date: 08/07/2019 CLINICAL DATA:  Ileus, upper abdominal pain EXAM: ABDOMEN - 1 VIEW COMPARISON:  08/05/2011 FINDINGS: Mild gaseous distension of the colon with moderate stool burden. Gaseous distention slightly increased since prior study. No organomegaly or free air. Diffuse aortic atherosclerosis. No visible aneurysm. No acute bony abnormality. IMPRESSION: Diffuse gaseous distention of the colon with moderate stool burden. Gaseous distention slightly increased since prior study. Findings likely reflect ileus. Electronically Signed   By: Rolm Baptise M.D.   On: 08/07/2019 08:47   Recent Labs    08/07/19 1900 08/08/19 0533  WBC 8.0 7.8  HGB 6.8* 9.1*  HCT 21.4* 27.7*  PLT 150 137*   Recent Labs    08/07/19 1237  NA 127*  K 4.7  CL 92*  CO2 24  GLUCOSE 249*  BUN 68*  CREATININE 4.22*  CALCIUM 8.0*    Intake/Output Summary (Last 24 hours) at 08/08/2019 0747 Last data filed at 08/08/2019 0509 Gross per 24 hour  Intake 1044 ml  Output 375 ml  Net 669 ml     Physical Exam: Vital Signs Blood pressure (!) 95/55, pulse 89, temperature 97.9 F (36.6 C), temperature source Axillary, resp. rate 16, height 5\' 8"  (1.727 m), weight 55.3 kg, SpO2 95 %. Constitutional: No distress . Vital signs reviewed.  Frail. HENT: Normocephalic.  Atraumatic. Eyes: EOMI. No discharge. Cardiovascular: No JVD. Respiratory: Normal effort.  No stridor. GI: Mild distention. Rectal- dried dark blood perianal  Skin: Warm and dry.  Intact. Psych: Normal mood.  Normal behavior. Musc: No edema in extremities.  No tenderness in extremities. Neurological: Alert Motor: RUE/RLE: 0/5 proximal distal, unchanged, pt states she has normal sensation  LUE: 4 -/5 proximal to distal LLE: 3 -/5 proximal to  distal  Assessment/Plan: 1. 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  Physiatrist is providing close team supervision and 24 hour management of active medical problems listed below.  Physiatrist and rehab team continue to assess barriers to discharge/monitor patient progress toward functional and medical goals  Care Tool:  Bathing    Body parts bathed by patient: Chest, Abdomen   Body parts bathed by helper: Right arm, Left arm Body parts n/a: Front perineal area, Buttocks, Right upper leg, Left lower leg, Right lower leg, Left upper leg(did not attempt)   Bathing assist Assist Level: Total Assistance - Patient < 25%(to maintain sitting balance at EOB)     Upper Body Dressing/Undressing Upper body dressing   What is the patient wearing?: Pull over shirt    Upper body assist Assist Level: Maximal Assistance - Patient 25 - 49%    Lower Body Dressing/Undressing Lower body dressing      What is the patient wearing?: Incontinence brief     Lower body assist Assist for lower body dressing: Dependent - Patient 0%(supine rolling)     Toileting Toileting    Toileting assist Assist for toileting: Dependent - Patient 0%     Transfers Chair/bed transfer  Transfers assist     Chair/bed transfer assist level: 2 Helpers     Locomotion Ambulation   Ambulation assist   Ambulation activity did not occur: Safety/medical concerns(poor postural control and decreased standing ability)  Walk 10 feet activity   Assist  Walk 10 feet activity did not occur: Safety/medical concerns        Walk 50 feet activity   Assist Walk 50 feet with 2 turns activity did not occur: Safety/medical concerns         Walk 150 feet activity   Assist Walk 150 feet activity did not occur: Safety/medical concerns         Walk 10 feet on uneven surface   activity   Assist Walk 10 feet on uneven surfaces activity did not occur: Safety/medical concerns         Wheelchair     Assist Will patient use wheelchair at discharge?: Yes Type of Wheelchair: Manual    Wheelchair assist level: Dependent - Patient 0% Max wheelchair distance: 150 ft    Wheelchair 50 feet with 2 turns activity    Assist        Assist Level: Dependent - Patient 0%   Wheelchair 150 feet activity     Assist      Assist Level: Dependent - Patient 0%   Blood pressure (!) 95/55, pulse 89, temperature 97.9 F (36.6 C), temperature source Axillary, resp. rate 16, height 5\' 8"  (1.727 m), weight 55.3 kg, SpO2 95 %.  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 Antithrombotics: -DVT/anticoagulation:SCDs -antiplatelet therapy: Aspirin 81 mg daily and Plavix 75 mg daily x3 weeks then aspirin alone 3. Pain Management:Tylenol as needed 4. Mood:Xanax 1 mg nightly held for lethargy , now improved will resume at bedtime prn at lower dose  -antipsychotic agents: N/A 5. Neuropsych: This patientiscapable of making decisions on herown behalf. 6. Skin/Wound Care:Routine skin checks -local skin care for abrasions -nutrition discussed  -benadryl prn for itching  -sarna lotion prn 7. Fluids/Electrolytes/Nutrition:Routine and outs 8.  ESRD. Hemodialysis per renal services. HD after therapy day to allow for better therapy participation 9. Orthostasis. Continue ProAmatine 10 mg Monday Wednesday Friday   Vitals:   08/08/19 0340 08/08/19 0509  BP: (!) 100/54 (!) 95/55  Pulse: 93 89  Resp: 16 16  Temp: 98.2 F (36.8 C) 97.9 F (36.6 C)  SpO2: 96% 95%  controlled  10.  Hyperlipidemia. Crestor 11. Acute on chronic combined congestive heart failure. Monitor for any signs of fluid overload. Daily weights  TEE suggesting EF of 35% with mild MR, TR, AS Filed Weights   08/07/19 0251 08/07/19 1219 08/07/19 1633  Weight: 58.1 kg 55.4 kg 55.3 kg    Weights trending up on 12/20 12. Diabetes mellitus type II with hyperglycemia, hemoglobin A1c 5.3.   Continue SSI  Extremely labile on 12/20 13. History of tobacco abuse. Counseling 14. CAD with CABG 2010. Continue aspirin. No chest pain reported. 15. IBS/history of GI bleed. hold Amitiza24 mcg daily - due to diarrhea  16.  Social son threatened to break through security to visit his mom  30.  Chronic cystitis  Seen by urology recommending Rocephin 2 g daily 18.  Diarrhea- C diff neg,was on  Amitiza took this every other day at home to avoid diarrhea will hold until stools are reduced  - some abd fullness no nausea or vomiting   Continue rectal tube  Will monitor closely with antibiotics 19.  Vaginal d/c: appreciate GYN eval 20. Fever x 1 with leukocytosis, off vanc and Zosyn,now on augmentin    Xanax DC'd- appears brighter but sleep is altered will restart low dose prn at noight   Fluid status per Nephro given HD   Patient oliguric-ESRD    Chest x-ray ordered- no evidence of infiltrate  21.  ABLA GI bleed hx of same, Hgb responded to 2 U PRBC , await GI consult , serial Hgb LOS: 12 days A FACE TO FACE EVALUATION WAS PERFORMED  Charlett Blake 08/08/2019, 7:47 AM

## 2019-08-08 NOTE — Progress Notes (Signed)
Occupational Therapy Session Note  Patient Details  Name: [name] MRN: [id number] Date of Birth: February 09, 1941    Skilled Therapeutic Interventions/Progress Updates: patient missed 30 minutes due bleeding of unknown original, low hemaglobin and decreased status per hospitalist that spoke with this cilnician and rehab tech outside room.    He gave verbal advisement to not see at this point until patient is more medially stable and source of bleeding is discovered     Therapy Documentation Precautions:  Precautions Precautions: Fall Precaution Comments: right hemiplegia Restrictions Weight Bearing Restrictions: No General:   Vital Signs:  Pain: did not speak to patient per hospital verbal advisement   ADL: ADL Eating: Set up Where Assessed-Eating: Chair Upper Body Bathing: Moderate assistance Where Assessed-Upper Body Bathing: Edge of bed Lower Body Bathing: Dependent Where Assessed-Lower Body Bathing: Bed level Upper Body Dressing: Maximal assistance Where Assessed-Upper Body Dressing: Edge of bed Lower Body Dressing: Dependent(+2) Where Assessed-Lower Body Dressing: Edge of bed Toilet Transfer: Maximal assistance(+2) Toilet Transfer Method: Stand pivot Toilet Transfer Equipment: Drop arm bedside commode Vision   Perception    Praxis   Exercises:   Other Treatments:     Herschell Dimes 08/08/2019, 1:41 PM

## 2019-08-08 NOTE — Progress Notes (Signed)
Received this patient from another nurse on unit. Patient was having some acute changes & needed a few interventions that were emergent & was taken over by this nurse. Patient was received in bed. She had afew orders that had justr been transcribed at the beginning of the shift by the previous shift nurse. Protonix IV stat bolus was given & other order was initiated. Blood was obtained from the blood bank & hung with 2 nurse verification. No reactions noted. Patient remained alert & responsive during the procedure initiation. Her Bp was low at the beginning & throughout the procedure, but no spikes noted in any of the vitals. On call provider called & was informed of status. There is another unit to give. Awaiting completion of the first. Oral medications were given as ordered & tolerated. No acute distress noted.

## 2019-08-08 NOTE — Progress Notes (Signed)
2nd unit of blood has ben hung. Patient is sleeping & shows no signs of distress. Arouses easily to voice. No reactions noted.Temp was 97.5, but taken auxillary due to patient being a mouth breather. Will continue to monitor.

## 2019-08-08 NOTE — Plan of Care (Signed)
  Problem: RH Attention Goal: LTG Patient will demonstrate this level of attention during functional activites (SLP) Description: LTG:  Patient will will demonstrate this level of attention during functional activites (SLP) Flowsheets (Taken 08/08/2019 [id number]) Patient will demonstrate during cognitive/linguistic activities the attention type of: Selective Patient will demonstrate this level of attention during cognitive/linguistic activities in: Home LTG: Patient will demonstrate this level of attention during cognitive/linguistic activities with assistance of (SLP): Supervision  Goal added to care plan due to selective attention deficits noted during tx session today 08/08/19

## 2019-08-08 NOTE — Progress Notes (Addendum)
[name] Progress Note   Dialysis Orders: MWF at [facility] 4[facility], 300/600, EDW 51.5kg, 2K/2.25Ca, TDC, UFP #4, heparin 1000 - [name] 200 q 2 weeks Right IJ TDC - failed AVF  Assessment/Plan: 1. 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 Adm to CIR 12/12 2. ESRD:MWF.holding heparin now due to GIB  Next HD Saturday due to holiday schedule  BFR is 300 but thinks she needs to run 400 /800 now due to running shorter times due to high inpatient census 3. Hypertension/volume:Permissive HTNinitially, now hypotensive -does not appear to have excess volume, titrate down as tolerated. Midodrine with HD.  4. Anemia:Hgb11.4>9.8 > 10.1 > 7.6 > 6.8 - transfsued 2 units PRBC  ,- resume ESA at lower dose due to high initial hgb - increase next dose to 150  5. Metabolic bone disease:Ca/Phos to goal. Continue home Sevelamer as binder.-un beknownst to her dialysis unit, she tells me she was also taking calcitriol at home - iPTH in goal - will d/c calcitriol and switch to TIW hectorol -home calcitriol should not be resumed at d/c 6. T2DM 7. 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 Nutrition - high risk for weight loss - intakeimproving-continue Nepro 9. Deconditioning/right hemiparesis - CIR adm - not sure how she will manage at home- she will need a lot of help 10. BRBPR - will hold heparin on HD, IV PPI started,    Jeanne Jacobson, PA-C [facility] Beeper [phone] 08/08/2019,11:21 AM  LOS: 12 days   Subjective:   Had ST today.  Unable to UF any fluid on dialysis yesterday due to lower than usual BP presumably from dropping hgb -   Objective Vitals:   08/08/19 0116 08/08/19 0144  08/08/19 0340 08/08/19 0509  BP: (!) 95/49 (!) 90/49 (!) 100/54 (!) 95/55  Pulse: 90 87 93 89  Resp: 16 16 16 16   Temp: (!) 97.5 F (36.4 C) 98.2 F (36.8 C) 98.2 F (36.8 C) 97.9 F (36.6 C)  TempSrc: Axillary Axillary Oral Axillary  SpO2: 98% 98% 96% 95%  Weight:      Height:       Physical Exam General: in bed very frail Heart: RRR Lungs: no rales Abdomen: soft slight dep edema Extremities: no LE edema Dialysis Access:  [facility]   Additional Objective Labs: Basic Metabolic Panel: Recent Labs  Lab 08/02/19 1510 08/04/19 1025 08/07/19 1237  NA 128* 130* 127*  K 3.9 3.5 4.7  CL 89* 94* 92*  CO2 24 21* 24  GLUCOSE 273* 155* 249*  BUN 86* 80* 68*  CREATININE 5.88* 5.36* 4.22*  CALCIUM 8.3* 8.7* 8.0*  PHOS 3.1  --  3.6   Liver Function Tests: Recent Labs  Lab 08/02/19 1510 08/04/19 1025 08/07/19 1237  AST  --  22  --   ALT  --  17  --   ALKPHOS  --  77  --   BILITOT  --  0.5  --   PROT  --  5.9*  --   ALBUMIN 2.2* 2.2* 1.7*   No results for input(s): LIPASE, AMYLASE in the last 168 hours. CBC: Recent Labs  Lab 08/05/19 0203 08/07/19 0607 08/07/19 1237 08/07/19 1900 08/08/19 0533  WBC 12.4* 9.2 10.3 8.0 7.8  NEUTROABS 9.2* 6.9  --   --   --  HGB 9.3* 10.1* 7.6* 6.8* 9.1*  HCT 28.2* 31.4* 24.4* 21.4* 27.7*  MCV 105.2* 105.4* 107.5* 105.9* 91.4  PLT 141* 146* 163 150 137*   Blood Culture    Component Value Date/Time   SDES BLOOD LEFT HAND 08/04/2019 2246   SPECREQUEST  08/04/2019 2246    BOTTLES DRAWN AEROBIC AND ANAEROBIC Blood Culture adequate volume   CULT  08/04/2019 2246    NO GROWTH 4 DAYS Performed at [facility] [address], [place], [place] [address]    REPTSTATUS PENDING 08/04/2019 2246    Cardiac Enzymes: No results for input(s): CKTOTAL, CKMB, CKMBINDEX, TROPONINI in the last 168 hours. CBG: Recent Labs  Lab 08/06/19 2127 08/07/19 0613 08/07/19 1138 08/07/19 2105 08/08/19 0623  GLUCAP 229* 136* 236* 129* 140*    Iron Studies: No results for input(s): IRON, TIBC, TRANSFERRIN, FERRITIN in the last 72 hours. Lab Results  Component Value Date   INR 1.0 07/23/2019   INR 1.07 04/18/2018   INR 1.4 10/16/2008   Studies/Results: DG Abd 1 View  Result Date: 08/07/2019 CLINICAL DATA:  Ileus, upper abdominal pain EXAM: ABDOMEN - 1 VIEW COMPARISON:  08/05/2011 FINDINGS: Mild gaseous distension of the colon with moderate stool burden. Gaseous distention slightly increased since prior study. No organomegaly or free air. Diffuse aortic atherosclerosis. No visible aneurysm. No acute bony abnormality. IMPRESSION: Diffuse gaseous distention of the colon with moderate stool burden. Gaseous distention slightly increased since prior study. Findings likely reflect ileus. Electronically Signed   By: Rolm Baptise M.D.   On: 08/07/2019 08:47   Medications: . sodium chloride 100 mL (08/05/19 2238)  . sodium chloride    . sodium chloride    . sodium chloride    . pantoprozole (PROTONIX) infusion 8 mg/hr (08/07/19 2141)   . amoxicillin-clavulanate  1 tablet Oral Q12H  . Chlorhexidine Gluconate Cloth  6 each Topical Q0600  . Chlorhexidine Gluconate Cloth  6 each Topical Q0600  . darbepoetin (ARANESP) injection - DIALYSIS  60 mcg Intravenous Q Wed-HD  . feeding supplement  1 Container Oral TID BM  . feeding supplement (NEPRO CARB STEADY)  237 mL Oral TID BM  . feeding supplement (PRO-STAT SUGAR FREE 64)  30 mL Oral BID  . folic acid  1 mg Oral Daily  . Gerhardt's butt cream   Topical QID  . insulin aspart  0-9 Units Subcutaneous TID WC  . miconazole nitrate   Topical BID  . midodrine  10 mg Oral Q M,W,F-HD  . multivitamin  1 tablet Oral QHS  . [START ON 08/11/2019] pantoprazole  40 mg Intravenous Q12H  . rosuvastatin  20 mg Oral q1800  . saccharomyces boulardii  250 mg Oral BID  . sevelamer carbonate  1,600 mg Oral TID WC  . vitamin B-12  500 mcg Oral Daily

## 2019-08-08 NOTE — Progress Notes (Addendum)
PROGRESS NOTE    [name]  [id number] DOB: [birth year] DOA: 07/27/2019 PCP: [name], MD   Brief Narrative: [name] is a 78 y.o. femalein Rrehab unit, with PMH of HTN, HLD, DM-2, systolic CHF, ESRD on HD MWF, CAD/CABG in 2010, AS, right thalamic CVA in 2018 and recent hospitalization for left ACA infarct. TRH consulted for concern of sepsis in setting of UTI. Patient treated with antibiotics and now is having BRBPR. GI consulted.    Assessment & Plan:   Active Problems:   Small vessel cerebrovascular accident (CVA) ([place])   Controlled type 2 diabetes mellitus with hyperglycemia, without long-term current use of insulin ([facility])   Labile blood glucose   Chronic cystitis   Diarrhea due to malabsorption   Lethargy   ESRD on dialysis ([place])   Sepsis secondary to UTI Not present on admission. Sepsis physiology resolved. Afebrile with no leukocytosis.  UTI Culture significant for Klebsiella pneumonia and enterococcus faecalis. Initially treated with Ceftriaxone, transitioned to Vancomycin and Zosyn and now transitioned to Augmentin. Agree with 5 day total course of treatment since blood culture no growth to date. Symptoms have resolved. -Augmentin  Adynamic ileus Seen on abdominal x-ray. Patient with bowel movements related to below. C. Difficile tested and was negative.   Bright red blood per rectum Acute GI bleed. Unknown if upper/lower GI bleed. Patient with a colonoscopy from 2018 significant for ulcers/erosions in the sigmoid/ascending colon in addition to sigmoid diverticulosis. Complicated by aspirin and Plavix use in the setting of recent CVA. Plavix discontinued and patient started on Protonix drip. Blood pressure is soft but stable in setting HD and chronic hypotension. Received 2 units of PRBC to date -GI consulted and recommendations pending. Started on Protonix drip. -Serial H&H -Would recommend holding heparin for HD if possible -Hold aspirin for  now  Chronic hypotension Stable. -Continue midodrine  History of CVA Currently undergoing rehabilitation. Was discharged on aspirin and Plavix. Plavix held secondary to above. -Continue Crestor -Will hold aspirin for now pending GI recommendations  ESRD -Per nephrology  Chronic systolic heart failure Stable. EF of 25-30%. Volume managed with HD.  Diabetes mellitus, type 2 -Continue SSI  Pressure injury Sacrum and right buttock, POA.   DVT prophylaxis: SCDs Code Status:   Code Status: Full Code Family Communication: None    Antimicrobials:  Ceftriaxone  Vancomycin  Zosyn  Augmentin    Subjective: No abdominal pain. Blood stool this morning. Some darker colored red stools  Objective: Vitals:   08/08/19 0116 08/08/19 0144 08/08/19 0340 08/08/19 0509  BP: (!) 95/49 (!) 90/49 (!) 100/54 (!) 95/55  Pulse: 90 87 93 89  Resp: 16 16 16 16   Temp: (!) 97.5 F (36.4 C) 98.2 F (36.8 C) 98.2 F (36.8 C) 97.9 F (36.6 C)  TempSrc: Axillary Axillary Oral Axillary  SpO2: 98% 98% 96% 95%  Weight:      Height:        Intake/Output Summary (Last 24 hours) at 08/08/2019 0806 Last data filed at 08/08/2019 0509 Gross per 24 hour  Intake 1044 ml  Output 375 ml  Net 669 ml   Filed Weights   08/07/19 0251 08/07/19 1219 08/07/19 1633  Weight: 58.1 kg 55.4 kg 55.3 kg    Examination:  General exam: Appears calm and comfortable Respiratory system: Clear to auscultation. Respiratory effort normal. Cardiovascular system: S1 & S2 heard, RRR. No murmurs, rubs, gallops or clicks. Gastrointestinal system: Abdomen is nondistended, soft and nontender. No organomegaly or  masses felt. Normal bowel sounds heard. Central nervous system: Alert and oriented. No focal neurological deficits. Extremities: No edema. No calf tenderness Skin: No cyanosis. Multiple areas of ecchymosis of upper/lower extremities Psychiatry: Judgement and insight appear normal. Mood & affect  appropriate.     Data Reviewed: I have personally reviewed following labs and imaging studies  CBC: Recent Labs  Lab 08/05/19 0203 08/07/19 0607 08/07/19 1237 08/07/19 1900 08/08/19 0533  WBC 12.4* 9.2 10.3 8.0 7.8  NEUTROABS 9.2* 6.9  --   --   --   HGB 9.3* 10.1* 7.6* 6.8* 9.1*  HCT 28.2* 31.4* 24.4* 21.4* 27.7*  MCV 105.2* 105.4* 107.5* 105.9* 91.4  PLT 141* 146* 163 150 0000000*   Basic Metabolic Panel: Recent Labs  Lab 08/02/19 1510 08/04/19 1025 08/07/19 1237  NA 128* 130* 127*  K 3.9 3.5 4.7  CL 89* 94* 92*  CO2 24 21* 24  GLUCOSE 273* 155* 249*  BUN 86* 80* 68*  CREATININE 5.88* 5.36* 4.22*  CALCIUM 8.3* 8.7* 8.0*  PHOS 3.1  --  3.6   GFR: Estimated Creatinine Clearance: 9.7 mL/min (A) (by C-G formula based on SCr of 4.22 mg/dL (H)). Liver Function Tests: Recent Labs  Lab 08/02/19 1510 08/04/19 1025 08/07/19 1237  AST  --  22  --   ALT  --  17  --   ALKPHOS  --  77  --   BILITOT  --  0.5  --   PROT  --  5.9*  --   ALBUMIN 2.2* 2.2* 1.7*   No results for input(s): LIPASE, AMYLASE in the last 168 hours. No results for input(s): AMMONIA in the last 168 hours. Coagulation Profile: No results for input(s): INR, PROTIME in the last 168 hours. Cardiac Enzymes: No results for input(s): CKTOTAL, CKMB, CKMBINDEX, TROPONINI in the last 168 hours. BNP (last 3 results) No results for input(s): PROBNP in the last 8760 hours. HbA1C: No results for input(s): HGBA1C in the last 72 hours. CBG: Recent Labs  Lab 08/06/19 2127 08/07/19 0613 08/07/19 1138 08/07/19 2105 08/08/19 0623  GLUCAP 229* 136* 236* 129* 140*   Lipid Profile: No results for input(s): CHOL, HDL, LDLCALC, TRIG, CHOLHDL, LDLDIRECT in the last 72 hours. Thyroid Function Tests: No results for input(s): TSH, T4TOTAL, FREET4, T3FREE, THYROIDAB in the last 72 hours. Anemia Panel: No results for input(s): VITAMINB12, FOLATE, FERRITIN, TIBC, IRON, RETICCTPCT in the last 72 hours. Sepsis  Labs: Recent Labs  Lab 08/04/19 2243 08/05/19 0203  LATICACIDVEN 0.8 0.5    Recent Results (from the past 240 hour(s))  Culture, Urine     Status: Abnormal   Collection Time: 07/31/19 10:05 PM   Specimen: Urine, Clean Catch  Result Value Ref Range Status   Specimen Description URINE, CLEAN CATCH  Final   Special Requests   Final    Normal Performed at [facility], [place] [address] [place],  [address]    Culture MULTIPLE SPECIES PRESENT, SUGGEST RECOLLECTION (A)  Final   Report Status 08/01/2019 FINAL  Final  C difficile quick scan w PCR reflex     Status: None   Collection Time: 07/31/19 10:05 PM   Specimen: Urine, Clean Catch; Stool  Result Value Ref Range Status   C Diff antigen NEGATIVE NEGATIVE Final   C Diff toxin NEGATIVE NEGATIVE Final   C Diff interpretation No C. difficile detected.  Final    Comment: Performed at [facility], [place] [address], [place], [place]  [id number]  Culture, Urine     Status: Abnormal   Collection Time: 08/04/19 11:00 AM   Specimen: Urine, Random  Result Value Ref Range Status   Specimen Description URINE, RANDOM  Final   Special Requests   Final    NONE Performed at [facility], [place] [address], [place], [place] [address]    Culture (A)  Final    >=100,000 COLONIES/mL KLEBSIELLA PNEUMONIAE >=100,000 COLONIES/mL ENTEROCOCCUS FAECALIS    Report Status 08/07/2019 FINAL  Final   Organism ID, Bacteria KLEBSIELLA PNEUMONIAE (A)  Final   Organism ID, Bacteria ENTEROCOCCUS FAECALIS (A)  Final      Susceptibility   Enterococcus faecalis - MIC*    AMPICILLIN <=2 SENSITIVE Sensitive     NITROFURANTOIN <=16 SENSITIVE Sensitive     VANCOMYCIN 1 SENSITIVE Sensitive     * >=100,000 COLONIES/mL ENTEROCOCCUS FAECALIS   Klebsiella pneumoniae - MIC*    AMPICILLIN >=32 RESISTANT Resistant     CEFAZOLIN <=4 SENSITIVE Sensitive     CEFTRIAXONE <=1 SENSITIVE Sensitive     CIPROFLOXACIN <=0.25 SENSITIVE Sensitive     GENTAMICIN  <=1 SENSITIVE Sensitive     IMIPENEM <=0.25 SENSITIVE Sensitive     NITROFURANTOIN 64 INTERMEDIATE Intermediate     TRIMETH/SULFA <=20 SENSITIVE Sensitive     AMPICILLIN/SULBACTAM <=2 SENSITIVE Sensitive     PIP/TAZO <=4 SENSITIVE Sensitive     * >=100,000 COLONIES/mL KLEBSIELLA PNEUMONIAE  Culture, blood (routine x 2)     Status: None (Preliminary result)   Collection Time: 08/04/19 10:43 PM   Specimen: BLOOD  Result Value Ref Range Status   Specimen Description BLOOD LEFT HAND  Final   Special Requests   Final    BOTTLES DRAWN AEROBIC AND ANAEROBIC Blood Culture results may not be optimal due to an inadequate volume of blood received in culture bottles   Culture   Final    NO GROWTH 4 DAYS Performed at [facility] [address] [place], [place] [address]    Report Status PENDING  Incomplete  Culture, blood (routine x 2)     Status: None (Preliminary result)   Collection Time: 08/04/19 10:46 PM   Specimen: BLOOD  Result Value Ref Range Status   Specimen Description BLOOD LEFT HAND  Final   Special Requests   Final    BOTTLES DRAWN AEROBIC AND ANAEROBIC Blood Culture adequate volume   Culture   Final    NO GROWTH 4 DAYS Performed at [facility], [place] [address] [place], [place] [address]    Report Status PENDING  Incomplete  Wet prep, genital     Status: Abnormal   Collection Time: 08/06/19  6:31 PM   Specimen: Vaginal; Genital  Result Value Ref Range Status   Yeast Wet Prep HPF POC NONE SEEN NONE SEEN Final   Trich, Wet Prep NONE SEEN NONE SEEN Final   Clue Cells Wet Prep HPF POC NONE SEEN NONE SEEN Final   WBC, Wet Prep HPF POC MANY (A) NONE SEEN Final   Sperm NONE SEEN  Final    Comment: Performed at [facility], [place] [address] [place], [place] [address]         Radiology Studies: DG Abd 1 View  Result Date: 08/07/2019 CLINICAL DATA:  Ileus, upper abdominal pain EXAM: ABDOMEN - 1 VIEW COMPARISON:  08/05/2011 FINDINGS: Mild gaseous  distension of the colon with moderate stool burden. Gaseous distention slightly increased since prior study. No organomegaly  or free air. Diffuse aortic atherosclerosis. No visible aneurysm. No acute bony abnormality. IMPRESSION: Diffuse gaseous distention of the colon with moderate stool burden. Gaseous distention slightly increased since prior study. Findings likely reflect ileus. Electronically Signed   By: Rolm Baptise M.D.   On: 08/07/2019 08:47        Scheduled Meds: . amoxicillin-clavulanate  1 tablet Oral Q12H  . aspirin EC  81 mg Oral Daily  . Chlorhexidine Gluconate Cloth  6 each Topical Q0600  . Chlorhexidine Gluconate Cloth  6 each Topical Q0600  . darbepoetin (ARANESP) injection - DIALYSIS  60 mcg Intravenous Q Wed-HD  . feeding supplement  1 Container Oral TID BM  . feeding supplement (NEPRO CARB STEADY)  237 mL Oral TID BM  . feeding supplement (PRO-STAT SUGAR FREE 64)  30 mL Oral BID  . folic acid  1 mg Oral Daily  . Gerhardt's butt cream   Topical QID  . insulin aspart  0-9 Units Subcutaneous TID WC  . miconazole nitrate   Topical BID  . midodrine  10 mg Oral Q M,W,F-HD  . multivitamin  1 tablet Oral QHS  . [START ON 08/11/2019] pantoprazole  40 mg Intravenous Q12H  . rosuvastatin  20 mg Oral q1800  . saccharomyces boulardii  250 mg Oral BID  . sevelamer carbonate  1,600 mg Oral TID WC  . vitamin B-12  500 mcg Oral Daily   Continuous Infusions: . sodium chloride 100 mL (08/05/19 2238)  . sodium chloride    . sodium chloride    . sodium chloride    . pantoprozole (PROTONIX) infusion 8 mg/hr (08/07/19 2141)     LOS: 12 days     Cordelia Poche, MD [facility] 08/08/2019, 8:06 AM  If 7PM-7AM, please contact night-coverage [email or web address]

## 2019-08-08 NOTE — Progress Notes (Signed)
During rounds, patient was noted awake & alert in bed. She stated that she felt like something had come out of her. When checked, she was sitting in a puddle of bloody , creamy consistency feces. Patient was given hygiene care. No c/o pain or discomfort. Blood administration & protonix was still infusing at the time. Small stage 2 pressure injury noted to the right buttock. Barrier cream was applied to the area until a foam drsg could be placed. No acute distress noted. Vitals were taken & her BP increased a little. She seems a little more alert but still weak. Will contiue to monitor

## 2019-08-08 NOTE — Progress Notes (Signed)
GYN Consult  Vaginal swabs have returned without evidence of infection. Suspect pt's vaginal discharge is physiological in nature and relate to poor hygiene   Results reviewed with pt. Pt reassured  Will sign off.   LOS: 12 days    [name] 08/08/2019, 6:25 AM

## 2019-08-09 DIAGNOSIS — K922 Gastrointestinal hemorrhage, unspecified: Secondary | ICD-10-CM

## 2019-08-09 LAB — CBC
HCT: 29.1 % — ABNORMAL LOW (ref 36.0–46.0)
Hemoglobin: 9.4 g/dL — ABNORMAL LOW (ref 12.0–15.0)
MCH: 30.3 pg (ref 26.0–34.0)
MCHC: 32.3 g/dL (ref 30.0–36.0)
MCV: 93.9 fL (ref 80.0–100.0)
Platelets: 135 10*3/uL — ABNORMAL LOW (ref 150–400)
RBC: 3.1 MIL/uL — ABNORMAL LOW (ref 3.87–5.11)
RDW: 25 % — ABNORMAL HIGH (ref 11.5–15.5)
WBC: 8.9 10*3/uL (ref 4.0–10.5)
nRBC: 0 % (ref 0.0–0.2)

## 2019-08-09 LAB — BASIC METABOLIC PANEL
Anion gap: 9 (ref 5–15)
BUN: 51 mg/dL — ABNORMAL HIGH (ref 8–23)
CO2: 25 mmol/L (ref 22–32)
Calcium: 8.2 mg/dL — ABNORMAL LOW (ref 8.9–10.3)
Chloride: 99 mmol/L (ref 98–111)
Creatinine, Ser: 3.76 mg/dL — ABNORMAL HIGH (ref 0.44–1.00)
GFR calc Af Amer: 13 mL/min — ABNORMAL LOW (ref >60)
GFR calc non Af Amer: 11 mL/min — ABNORMAL LOW (ref >60)
Glucose, Bld: 167 mg/dL — ABNORMAL HIGH (ref 70–99)
Potassium: 4 mmol/L (ref 3.5–5.1)
Sodium: 133 mmol/L — ABNORMAL LOW (ref 135–145)

## 2019-08-09 LAB — GLUCOSE, CAPILLARY
Glucose-Capillary: 137 mg/dL — ABNORMAL HIGH (ref 70–99)
Glucose-Capillary: 256 mg/dL — ABNORMAL HIGH (ref 70–99)
Glucose-Capillary: 175 mg/dL — ABNORMAL HIGH (ref 70–99)
Glucose-Capillary: 128 mg/dL — ABNORMAL HIGH (ref 70–99)

## 2019-08-09 LAB — TYPE AND SCREEN
ABO/RH(D): O NEG
Antibody Screen: NEGATIVE
Unit division: 0
Unit division: 0

## 2019-08-09 LAB — CULTURE, BLOOD (ROUTINE X 2)
Culture: NO GROWTH
Culture: NO GROWTH
Special Requests: ADEQUATE

## 2019-08-09 LAB — RETICULOCYTES
Immature Retic Fract: 29.7 % — ABNORMAL HIGH (ref 2.3–15.9)
RBC.: 3.12 MIL/uL — ABNORMAL LOW (ref 3.87–5.11)
Retic Count, Absolute: 51.5 10*3/uL (ref 19.0–186.0)
Retic Ct Pct: 1.7 % (ref 0.4–3.1)

## 2019-08-09 LAB — HEMOGLOBIN AND HEMATOCRIT, BLOOD
HCT: 26.6 % — ABNORMAL LOW (ref 36.0–46.0)
HCT: 24.3 % — ABNORMAL LOW (ref 36.0–46.0)
Hemoglobin: 8.7 g/dL — ABNORMAL LOW (ref 12.0–15.0)
Hemoglobin: 7.9 g/dL — ABNORMAL LOW (ref 12.0–15.0)

## 2019-08-09 LAB — VITAMIN B12: Vitamin B-12: 1361 pg/mL — ABNORMAL HIGH (ref 180–914)

## 2019-08-09 LAB — BPAM RBC
Blood Product Expiration Date: 202012262359
Blood Product Expiration Date: 202012282359
ISSUE DATE / TIME: 202012232135
ISSUE DATE / TIME: 202012240100
Unit Type and Rh: 9500
Unit Type and Rh: 9500

## 2019-08-09 LAB — FOLATE: Folate: 23.9 ng/mL (ref >5.9)

## 2019-08-09 LAB — IRON AND TIBC
Iron: 56 ug/dL (ref 28–170)
Saturation Ratios: 29 % (ref 10.4–31.8)
TIBC: 193 ug/dL — ABNORMAL LOW (ref 250–450)
UIBC: 137 ug/dL

## 2019-08-09 LAB — FERRITIN: Ferritin: 1402 ng/mL — ABNORMAL HIGH (ref 11–307)

## 2019-08-09 NOTE — Progress Notes (Signed)
[place] PHYSICAL MEDICINE & REHABILITATION PROGRESS NOTE   Subjective/Complaints:   Appreciate IM and GI consults as well as Gyn consult  Discussed with pt and husband  in general terms finding on abd CT including rectal mass  Vs proctitis and endometrial mass, discussed that GI will need to address the rectal findings and Gyn f/u for endometrial findings   Per RN no further bleeding  No PT OT, x 2 d  ROS: Denies CP, SOB, N/V.  Objective:   CT Angio Abd/Pel w/ and/or w/o  Result Date: 08/09/2019 CLINICAL DATA:  78 year old female with bright red blood per rectum, suspect lower GI bleed. EXAM: CTA ABDOMEN AND PELVIS WITHOUT AND WITH CONTRAST TECHNIQUE: Multidetector CT imaging of the abdomen and pelvis was performed using the standard protocol during bolus administration of intravenous contrast. Multiplanar reconstructed images and MIPs were obtained and reviewed to evaluate the vascular anatomy. CONTRAST:  151mL OMNIPAQUE IOHEXOL 350 MG/ML SOLN COMPARISON:  CTA chest 10/07/2008; abdominal CTA 08/01/2019 FINDINGS: VASCULAR Aorta: Severe calcified atherosclerotic plaque throughout the abdominal aorta. No evidence of aneurysm or dissection. Celiac: Heavily calcified atherosclerotic plaque results in mild to moderate stenosis at the origin of the celiac artery. SMA: Chronic long artery. Segment occlusion of the proximal superior mesenteric The artery reconstitutes via collateral flow through the pancreaticoduodenal arcade. Renals: Severe heavy calcification of the solitary right renal artery likely resulting in significant stenosis. There are multiple small left-sided renal arteries, at least 3. Calcifications are present at the artery origins. Degree of stenosis difficult to assess given small vessel size. IMA: Chronically occluded at the origin. The distal branches reconstitute. Inflow: Heavy atherosclerotic plaque in the bilateral common iliac arteries resulting in moderate to high-grade  stenosis on the left, and mild to moderate stenosis on the right. The left internal iliac artery is occluded at the origin. The right internal iliac artery is patent. The external iliac arteries are relatively spared from disease. Proximal Outflow: No acute abnormality. Veins: No obvious venous abnormality within the limitations of this arterial phase study. Review of the MIP images confirms the above findings. NON-VASCULAR Lower chest: Small bilateral pleural effusions and associated atelectasis. Partially peripherally calcified circumscribed low-attenuation cystic juxta cardiac mass measuring 3.0 x 5.5 x 2.4 cm affiliated with the inferior aspect of the pericardium. This abnormality was not present on prior imaging from 2010. There is mild local mass effect on the right ventricle. The heart is normal in size. No pericardial effusion. Patient is status post median sternotomy. Hepatobiliary: Mildly nodular liver contour. Findings suggest underlying cirrhosis. No discrete hepatic lesion. No gallstones. The gallbladder is decompressed. However, there is gallbladder wall thickening. Pancreas: Unremarkable. No pancreatic ductal dilatation or surrounding inflammatory changes. Spleen: Mild splenomegaly. Adrenals/Urinary Tract: Unremarkable adrenal glands. Bilateral renal atrophy. Punctate parenchymal calcification in the interpolar right kidney. Small circumscribed low-attenuation lesions bilaterally are too small for accurate characterization but statistically likely cysts. No evidence of hydronephrosis. Unremarkable ureters. A Foley catheter is present in the bladder. Stomach/Bowel: Hyperenhancement and thickening of the mucosa of the rectum particularly along the posterior wall. The enhancement is visible on the venous phase images but not the arterial phase images. There is some mild perirectal interstitial stranding in the presacral space. There is no evidence of active arterial bleeding. Normal appendix. Lymphatic:  No suspicious lymphadenopathy. Reproductive: Abnormal. The endometrial canal is significantly expanded up to 18 mm and is filled with intermediate attenuation fluid. No adnexal masses. Other: Trace perihepatic ascites. Musculoskeletal: No acute or significant osseous  findings. Multilevel degenerative disc disease most significant at L5-S1. IMPRESSION: VASCULAR 1. No evidence of active arterial bleeding. 2. Severe and heavily calcified atherosclerotic disease involving nearly all vascular beds including the aorta. Aortic Atherosclerosis (ICD10-170.0) 3. 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 4. 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 5. High-grade stenosis of the left common iliac artery. 6. High-grade stenosis of the right renal artery. 7. Multiple small left-sided renal arteries. NON-VASCULAR 1. Hyperenhancement and thickening of the mucosa of the rectum, particularly along the posterior wall. There is some associated inflammatory stranding in the presacral space as well. Primary differential considerations include proctitis versus rectal neoplasm. Hemorrhoids are also a possibility but would not account for the associated inflammatory stranding. This abnormality likely represents the source of the patient's hematochezia. 2. Diffuse thickening of the endometrium with fluid in the endometrial canal. This is an abnormal finding in a postmenopausal female. Differential considerations include endometrial neoplasm, hyperplasia or polyps. Recommend non emergent referral to OBGYN for endometrial biopsy. 3. Circumscribed juxta cardiac intra pericardial fluid collection in the inferior aspect of the pericardial space resulting in mild mass effect on the inferior aspect of the right ventricle. This abnormality was not present in 2010 but the patient has since undergone median sternotomy. This is favored to represent sequelae of a prior hematoma. 4. Hepatic cirrhosis with  splenomegaly and trace perihepatic ascites consistent with portal hypertension. 5. Small bilateral pleural effusions and associated atelectasis. 6. Additional ancillary findings as above. Electronically Signed   By: Jacqulynn Cadet M.D.   On: 08/09/2019 08:52   Recent Labs    08/08/19 0533 08/08/19 2042 08/09/19 0517  WBC 7.8  --  8.9  HGB 9.1* 9.6* 9.4*  HCT 27.7* 29.5* 29.1*  PLT 137*  --  135*   Recent Labs    08/07/19 1237 08/09/19 0517  NA 127* 133*  K 4.7 4.0  CL 92* 99  CO2 24 25  GLUCOSE 249* 167*  BUN 68* 51*  CREATININE 4.22* 3.76*  CALCIUM 8.0* 8.2*    Intake/Output Summary (Last 24 hours) at 08/09/2019 1015 Last data filed at 08/09/2019 0700 Gross per 24 hour  Intake 582 ml  Output --  Net 582 ml     Physical Exam: Vital Signs Blood pressure 125/64, pulse 90, temperature 98.4 F (36.9 C), resp. rate 16, height 5\' 8"  (1.727 m), weight 55.3 kg, SpO2 99 %. Constitutional: No distress . Vital signs reviewed.  Frail. HENT: Normocephalic.  Atraumatic. Eyes: EOMI. No discharge. Cardiovascular: No JVD. Respiratory: Normal effort.  No stridor. GI: Mild distention. Rectal- dried dark blood perianal  Skin: Warm and dry.  Intact. Psych: Normal mood.  Normal behavior. Musc: No edema in extremities.  No tenderness in extremities. Neurological: Alert Motor: RUE/RLE: 0/5 proximal distal, unchanged, pt states she has normal sensation  LUE: 4 -/5 proximal to distal LLE: 3 -/5 proximal to distal  Assessment/Plan: 1. 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  Physiatrist is providing close team supervision and 24 hour management of active medical problems listed below.  Physiatrist and rehab team continue to assess barriers to discharge/monitor patient progress toward functional and medical goals  Care Tool:  Bathing    Body parts bathed by patient:  Chest, Abdomen   Body parts bathed by helper: Right arm, Left arm Body parts n/a: Front perineal area, Buttocks, Right upper leg, Left  lower leg, Right lower leg, Left upper leg(did not attempt)   Bathing assist Assist Level: Total Assistance - Patient < 25%(to maintain sitting balance at EOB)     Upper Body Dressing/Undressing Upper body dressing   What is the patient wearing?: Pull over shirt    Upper body assist Assist Level: Maximal Assistance - Patient 25 - 49%    Lower Body Dressing/Undressing Lower body dressing      What is the patient wearing?: Incontinence brief     Lower body assist Assist for lower body dressing: Dependent - Patient 0%(supine rolling)     Toileting Toileting    Toileting assist Assist for toileting: Dependent - Patient 0%     Transfers Chair/bed transfer  Transfers assist     Chair/bed transfer assist level: 2 Helpers     Locomotion Ambulation   Ambulation assist   Ambulation activity did not occur: Safety/medical concerns(poor postural control and decreased standing ability)          Walk 10 feet activity   Assist  Walk 10 feet activity did not occur: Safety/medical concerns        Walk 50 feet activity   Assist Walk 50 feet with 2 turns activity did not occur: Safety/medical concerns         Walk 150 feet activity   Assist Walk 150 feet activity did not occur: Safety/medical concerns         Walk 10 feet on uneven surface  activity   Assist Walk 10 feet on uneven surfaces activity did not occur: Safety/medical concerns         Wheelchair     Assist Will patient use wheelchair at discharge?: Yes Type of Wheelchair: Manual    Wheelchair assist level: Dependent - Patient 0% Max wheelchair distance: 150 ft    Wheelchair 50 feet with 2 turns activity    Assist        Assist Level: Dependent - Patient 0%   Wheelchair 150 feet activity     Assist      Assist Level:  Dependent - Patient 0%   Blood pressure 125/64, pulse 90, temperature 98.4 F (36.9 C), resp. rate 16, height 5\' 8"  (1.727 m), weight 55.3 kg, SpO2 99 %.  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 Antithrombotics: -DVT/anticoagulation:SCDs -antiplatelet therapy: Aspirin 81 mg daily and Plavix 75 mg daily x3 weeks then aspirin alone 3. Pain Management:Tylenol as needed 4. Mood:Xanax 1 mg nightly held for lethargy , now improved will resume at bedtime prn at lower dose  -antipsychotic agents: N/A 5. Neuropsych: This patientiscapable of making decisions on herown behalf. 6. Skin/Wound Care:Routine skin checks -local skin care for abrasions -nutrition discussed  -benadryl prn for itching  -sarna lotion prn 7. Fluids/Electrolytes/Nutrition:Routine and outs 8.  ESRD. Hemodialysis per renal services. HD after therapy day to allow for better therapy participation 9. Orthostasis. Continue ProAmatine 10 mg Monday Wednesday Friday   Vitals:   08/08/19 1949 08/09/19 0439  BP: 114/65 125/64  Pulse: 91 90  Resp: 18 16  Temp: 98.5 F (36.9 C) 98.4 F (36.9 C)  SpO2: 96% 99%  controlled  10. Hyperlipidemia.  Crestor 11. Acute on chronic combined congestive heart failure. Monitor for any signs of fluid overload. Daily weights  TEE suggesting EF of 35% with mild MR, TR, AS Filed Weights   08/07/19 0251 08/07/19 1219 08/07/19 1633  Weight: 58.1 kg 55.4 kg 55.3 kg    Weights trending up on 12/20 12. Diabetes mellitus type II with hyperglycemia, hemoglobin A1c 5.3.   Continue SSI  Extremely labile on 12/20 13. History of tobacco  abuse. Counseling 14. CAD with CABG 2010. Continue aspirin. No chest pain reported. 15. IBS/history of GI bleed. hold Amitiza24 mcg daily - due to diarrhea  16.  Social son threatened to break through security to visit his mom  31.  Chronic cystitis  Seen by urology recommending Rocephin 2 g daily 18.  Diarrhea- C diff neg,was on  Amitiza took this every other day at home to avoid diarrhea will hold until stools are reduced  - some abd fullness no nausea or vomiting   Continue rectal tube  Will monitor closely with antibiotics 19.  Vaginal d/c: appreciate GYN eval- endometrial mass will need f/u  20. Fever x 1 with leukocytosis, off vanc and Zosyn,now on augmentin    Xanax DC'd- appears brighter but sleep is altered will restart low dose prn at noight   Fluid status per Nephro given HD   Patient oliguric-ESRD    Chest x-ray ordered- no evidence of infiltrate  21.  ABLA GI bleed hx of same, Hgb responded to 2 U PRBC , appreciate GI consult , serial Hgb LOS: 13 days A FACE TO FACE EVALUATION WAS PERFORMED  Charlett Blake 08/09/2019, 10:15 AM

## 2019-08-09 NOTE — Progress Notes (Signed)
PROGRESS NOTE    [name]  [id number] DOB: 1941/05/20 DOA: 07/27/2019 PCP: [name], MD   Brief Narrative: [name] is a 78 y.o. femalein Rrehab unit, with PMH of HTN, HLD, DM-2, systolic CHF, ESRD on HD MWF, CAD/CABG in 2010, AS, right thalamic CVA in 2018 and recent hospitalization for left ACA infarct. TRH consulted for concern of sepsis in setting of UTI. Patient treated with antibiotics and now is having BRBPR. GI consulted.    Assessment & Plan:   Active Problems:   Small vessel cerebrovascular accident (CVA) ([place])   Controlled type 2 diabetes mellitus with hyperglycemia, without long-term current use of insulin ([facility])   Labile blood glucose   Chronic cystitis   Diarrhea due to malabsorption   Lethargy   ESRD on dialysis ([place])   Sepsis secondary to UTI Not present on admission. Sepsis physiology resolved. Afebrile with no leukocytosis.  UTI Culture significant for Klebsiella pneumonia and enterococcus faecalis. Initially treated with Ceftriaxone, transitioned to Vancomycin and Zosyn and now transitioned to Augmentin. Agree with 5 day total course of treatment since blood culture no growth to date. Symptoms have resolved. -Augmentin  Adynamic ileus Seen on abdominal x-ray. Patient with bowel movements related to below. C. Difficile tested and was negative.   Bright red blood per rectum Acute GI bleed. Unknown if upper/lower GI bleed. Patient with a colonoscopy from 2018 significant for ulcers/erosions in the sigmoid/ascending colon in addition to sigmoid diverticulosis. Complicated by aspirin and Plavix use in the setting of recent CVA. Plavix discontinued and patient started on Protonix IV. Blood pressure is soft but stable in setting HD and chronic hypotension. Received 2 units of PRBC to date. Likely diverticular bleed. Hemoglobin likely equilibrating but not dropping significantly. -GI consulted: recommending CTA abdomen (pending) -Serial H&H  -Holding aspirin and plavix -Await CTA result  Chronic hypotension Stable. -Continue midodrine  History of CVA Currently undergoing rehabilitation. Was discharged on aspirin and Plavix. Plavix held secondary to above. -Continue Crestor -Will hold aspirin for now pending GI recommendations  ESRD -Per nephrology  Chronic systolic heart failure Stable. EF of 25-30%. Volume managed with HD.  Diabetes mellitus, type 2 -Continue SSI  Pressure injury Sacrum and right buttock, POA.   DVT prophylaxis: SCDs Code Status:   Code Status: Full Code Family Communication: None    Antimicrobials:  Ceftriaxone  Vancomycin  Zosyn  Augmentin    Subjective: No issues. Patient states she did not have any bowel movements overnight.  Objective: Vitals:   08/08/19 0509 08/08/19 1521 08/08/19 1949 08/09/19 0439  BP: (!) 95/55 101/61 114/65 125/64  Pulse: 89 88 91 90  Resp: 16 16 18 16   Temp: 97.9 F (36.6 C) 98.3 F (36.8 C) 98.5 F (36.9 C) 98.4 F (36.9 C)  TempSrc: Axillary     SpO2: 95% 96% 96% 99%  Weight:      Height:        Intake/Output Summary (Last 24 hours) at 08/09/2019 0855 Last data filed at 08/09/2019 0700 Gross per 24 hour  Intake 702 ml  Output -  Net 702 ml   Filed Weights   08/07/19 0251 08/07/19 1219 08/07/19 1633  Weight: 58.1 kg 55.4 kg 55.3 kg    Examination:  General exam: Appears calm and comfortable Respiratory system: Clear to auscultation. Respiratory effort normal. Cardiovascular system: S1 & S2 heard, RRR. 2/6 systolic murmur. Gastrointestinal system: Abdomen is nondistended, soft and nontender. No organomegaly or masses felt. Normal bowel sounds heard.  Central nervous system: Alert. Extremities: No edema. No calf tenderness Skin: No cyanosis. Multiple ecchymotic rashes Psychiatry: Judgement and insight appear normal. Mood & affect appropriate.      Data Reviewed: I have personally reviewed following labs and imaging studies   CBC: Recent Labs  Lab 08/05/19 0203 08/07/19 0607 08/07/19 1237 08/07/19 1900 08/08/19 0533 08/08/19 1234 08/08/19 2042 08/09/19 0517  WBC 12.4* 9.2 10.3 8.0 7.8  --   --  8.9  NEUTROABS 9.2* 6.9  --   --   --   --   --   --   HGB 9.3* 10.1* 7.6* 6.8* 9.1* 10.5* 9.6* 9.4*  HCT 28.2* 31.4* 24.4* 21.4* 27.7* 31.8* 29.5* 29.1*  MCV 105.2* 105.4* 107.5* 105.9* 91.4  --   --  93.9  PLT 141* 146* 163 150 137*  --   --  [id number]*   Basic Metabolic Panel: Recent Labs  Lab 08/02/19 1510 08/04/19 1025 08/07/19 1237 08/09/19 0517  NA 128* 130* 127* 133*  K 3.9 3.5 4.7 4.0  CL 89* 94* 92* 99  CO2 24 21* 24 25  GLUCOSE 273* 155* 249* 167*  BUN 86* 80* 68* 51*  CREATININE 5.88* 5.36* 4.22* 3.76*  CALCIUM 8.3* 8.7* 8.0* 8.2*  PHOS 3.1  --  3.6  --    GFR: Estimated Creatinine Clearance: 10.9 mL/min (A) (by C-G formula based on SCr of 3.76 mg/dL (H)). Liver Function Tests: Recent Labs  Lab 08/02/19 1510 08/04/19 1025 08/07/19 1237  AST  --  22  --   ALT  --  17  --   ALKPHOS  --  77  --   BILITOT  --  0.5  --   PROT  --  5.9*  --   ALBUMIN 2.2* 2.2* 1.7*   No results for input(s): LIPASE, AMYLASE in the last 168 hours. No results for input(s): AMMONIA in the last 168 hours. Coagulation Profile: No results for input(s): INR, PROTIME in the last 168 hours. Cardiac Enzymes: No results for input(s): CKTOTAL, CKMB, CKMBINDEX, TROPONINI in the last 168 hours. BNP (last 3 results) No results for input(s): PROBNP in the last 8760 hours. HbA1C: No results for input(s): HGBA1C in the last 72 hours. CBG: Recent Labs  Lab 08/08/19 0623 08/08/19 1140 08/08/19 1650 08/08/19 2141 08/09/19 0614  GLUCAP 140* 188* 273* 268* 137*   Lipid Profile: No results for input(s): CHOL, HDL, LDLCALC, TRIG, CHOLHDL, LDLDIRECT in the last 72 hours. Thyroid Function Tests: No results for input(s): TSH, T4TOTAL, FREET4, T3FREE, THYROIDAB in the last 72 hours. Anemia Panel: Recent Labs     08/09/19 0517  VITAMINB12 1,361*  FOLATE 23.9  FERRITIN 1,402*  TIBC 193*  IRON 56  RETICCTPCT 1.7   Sepsis Labs: Recent Labs  Lab 08/04/19 2243 08/05/19 0203  LATICACIDVEN 0.8 0.5    Recent Results (from the past 240 hour(s))  Culture, Urine     Status: Abnormal   Collection Time: 07/31/19 10:05 PM   Specimen: Urine, Clean Catch  Result Value Ref Range Status   Specimen Description URINE, CLEAN CATCH  Final   Special Requests   Final    Normal Performed at [facility], [place] [address], [place], [place] [address]    Culture MULTIPLE SPECIES PRESENT, SUGGEST RECOLLECTION (A)  Final   Report Status 08/01/2019 FINAL  Final  C difficile quick scan w PCR reflex     Status: None   Collection Time: 07/31/19 10:05 PM   Specimen: Urine,  Clean Catch; Stool  Result Value Ref Range Status   C Diff antigen NEGATIVE NEGATIVE Final   C Diff toxin NEGATIVE NEGATIVE Final   C Diff interpretation No C. difficile detected.  Final    Comment: Performed at [facility], [place] [address], [place], [place] [address]  Culture, Urine     Status: Abnormal   Collection Time: 08/04/19 11:00 AM   Specimen: Urine, Random  Result Value Ref Range Status   Specimen Description URINE, RANDOM  Final   Special Requests   Final    NONE Performed at [facility], [place] [address], [place], [place] [address]    Culture (A)  Final    >=100,000 COLONIES/mL KLEBSIELLA PNEUMONIAE >=100,000 COLONIES/mL ENTEROCOCCUS FAECALIS    Report Status 08/07/2019 FINAL  Final   Organism ID, Bacteria KLEBSIELLA PNEUMONIAE (A)  Final   Organism ID, Bacteria ENTEROCOCCUS FAECALIS (A)  Final      Susceptibility   Enterococcus faecalis - MIC*    AMPICILLIN <=2 SENSITIVE Sensitive     NITROFURANTOIN <=16 SENSITIVE Sensitive     VANCOMYCIN 1 SENSITIVE Sensitive     * >=100,000 COLONIES/mL ENTEROCOCCUS FAECALIS   Klebsiella pneumoniae - MIC*    AMPICILLIN >=32 RESISTANT Resistant     CEFAZOLIN <=4  SENSITIVE Sensitive     CEFTRIAXONE <=1 SENSITIVE Sensitive     CIPROFLOXACIN <=0.25 SENSITIVE Sensitive     GENTAMICIN <=1 SENSITIVE Sensitive     IMIPENEM <=0.25 SENSITIVE Sensitive     NITROFURANTOIN 64 INTERMEDIATE Intermediate     TRIMETH/SULFA <=20 SENSITIVE Sensitive     AMPICILLIN/SULBACTAM <=2 SENSITIVE Sensitive     PIP/TAZO <=4 SENSITIVE Sensitive     * >=100,000 COLONIES/mL KLEBSIELLA PNEUMONIAE  Culture, blood (routine x 2)     Status: None   Collection Time: 08/04/19 10:43 PM   Specimen: BLOOD  Result Value Ref Range Status   Specimen Description BLOOD LEFT HAND  Final   Special Requests   Final    BOTTLES DRAWN AEROBIC AND ANAEROBIC Blood Culture results may not be optimal due to an inadequate volume of blood received in culture bottles   Culture   Final    NO GROWTH 5 DAYS Performed at [place] [address] [place], [place] [address]    Report Status 08/09/2019 FINAL  Final  Culture, blood (routine x 2)     Status: None   Collection Time: 08/04/19 10:46 PM   Specimen: BLOOD  Result Value Ref Range Status   Specimen Description BLOOD LEFT HAND  Final   Special Requests   Final    BOTTLES DRAWN AEROBIC AND ANAEROBIC Blood Culture adequate volume   Culture   Final    NO GROWTH 5 DAYS Performed at [facility], [place] [address] [place], [place] [address]    Report Status 08/09/2019 FINAL  Final  Wet prep, genital     Status: Abnormal   Collection Time: 08/06/19  6:31 PM   Specimen: Vaginal; Genital  Result Value Ref Range Status   Yeast Wet Prep HPF POC NONE SEEN NONE SEEN Final   Trich, Wet Prep NONE SEEN NONE SEEN Final   Clue Cells Wet Prep HPF POC NONE SEEN NONE SEEN Final   WBC, Wet Prep HPF POC MANY (A) NONE SEEN Final   Sperm NONE SEEN  Final    Comment: Performed at [facility], [place] [address], [place], [place] [address]  Radiology Studies: CT Angio Abd/Pel w/ and/or w/o  Result Date: 08/09/2019 CLINICAL DATA:   78 year old female with bright red blood per rectum, suspect lower GI bleed. EXAM: CTA ABDOMEN AND PELVIS WITHOUT AND WITH CONTRAST TECHNIQUE: Multidetector CT imaging of the abdomen and pelvis was performed using the standard protocol during bolus administration of intravenous contrast. Multiplanar reconstructed images and MIPs were obtained and reviewed to evaluate the vascular anatomy. CONTRAST:  153mL OMNIPAQUE IOHEXOL 350 MG/ML SOLN COMPARISON:  CTA chest 10/07/2008; abdominal CTA 08/01/2019 FINDINGS: VASCULAR Aorta: Severe calcified atherosclerotic plaque throughout the abdominal aorta. No evidence of aneurysm or dissection. Celiac: Heavily calcified atherosclerotic plaque results in mild to moderate stenosis at the origin of the celiac artery. SMA: Chronic long artery. Segment occlusion of the proximal superior mesenteric The artery reconstitutes via collateral flow through the pancreaticoduodenal arcade. Renals: Severe heavy calcification of the solitary right renal artery likely resulting in significant stenosis. There are multiple small left-sided renal arteries, at least 3. Calcifications are present at the artery origins. Degree of stenosis difficult to assess given small vessel size. IMA: Chronically occluded at the origin. The distal branches reconstitute. Inflow: Heavy atherosclerotic plaque in the bilateral common iliac arteries resulting in moderate to high-grade stenosis on the left, and mild to moderate stenosis on the right. The left internal iliac artery is occluded at the origin. The right internal iliac artery is patent. The external iliac arteries are relatively spared from disease. Proximal Outflow: No acute abnormality. Veins: No obvious venous abnormality within the limitations of this arterial phase study. Review of the MIP images confirms the above findings. NON-VASCULAR Lower chest: Small bilateral pleural effusions and associated atelectasis. Partially peripherally calcified  circumscribed low-attenuation cystic juxta cardiac mass measuring 3.0 x 5.5 x 2.4 cm affiliated with the inferior aspect of the pericardium. This abnormality was not present on prior imaging from 2010. There is mild local mass effect on the right ventricle. The heart is normal in size. No pericardial effusion. Patient is status post median sternotomy. Hepatobiliary: Mildly nodular liver contour. Findings suggest underlying cirrhosis. No discrete hepatic lesion. No gallstones. The gallbladder is decompressed. However, there is gallbladder wall thickening. Pancreas: Unremarkable. No pancreatic ductal dilatation or surrounding inflammatory changes. Spleen: Mild splenomegaly. Adrenals/Urinary Tract: Unremarkable adrenal glands. Bilateral renal atrophy. Punctate parenchymal calcification in the interpolar right kidney. Small circumscribed low-attenuation lesions bilaterally are too small for accurate characterization but statistically likely cysts. No evidence of hydronephrosis. Unremarkable ureters. A Foley catheter is present in the bladder. Stomach/Bowel: Hyperenhancement and thickening of the mucosa of the rectum particularly along the posterior wall. The enhancement is visible on the venous phase images but not the arterial phase images. There is some mild perirectal interstitial stranding in the presacral space. There is no evidence of active arterial bleeding. Normal appendix. Lymphatic: No suspicious lymphadenopathy. Reproductive: Abnormal. The endometrial canal is significantly expanded up to 18 mm and is filled with intermediate attenuation fluid. No adnexal masses. Other: Trace perihepatic ascites. Musculoskeletal: No acute or significant osseous findings. Multilevel degenerative disc disease most significant at L5-S1. IMPRESSION: VASCULAR 1. No evidence of active arterial bleeding. 2. Severe and heavily calcified atherosclerotic disease involving nearly all vascular beds including the aorta. Aortic  Atherosclerosis (ICD10-170.0) 3. 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 4. 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 5. High-grade stenosis of the left common iliac artery. 6. High-grade stenosis of the right renal artery. 7. Multiple small left-sided renal arteries. NON-VASCULAR 1. Hyperenhancement  and thickening of the mucosa of the rectum, particularly along the posterior wall. There is some associated inflammatory stranding in the presacral space as well. Primary differential considerations include proctitis versus rectal neoplasm. Hemorrhoids are also a possibility but would not account for the associated inflammatory stranding. This abnormality likely represents the source of the patient's hematochezia. 2. Diffuse thickening of the endometrium with fluid in the endometrial canal. This is an abnormal finding in a postmenopausal female. Differential considerations include endometrial neoplasm, hyperplasia or polyps. Recommend non emergent referral to OBGYN for endometrial biopsy. 3. Circumscribed juxta cardiac intra pericardial fluid collection in the inferior aspect of the pericardial space resulting in mild mass effect on the inferior aspect of the right ventricle. This abnormality was not present in 2010 but the patient has since undergone median sternotomy. This is favored to represent sequelae of a prior hematoma. 4. Hepatic cirrhosis with splenomegaly and trace perihepatic ascites consistent with portal hypertension. 5. Small bilateral pleural effusions and associated atelectasis. 6. Additional ancillary findings as above. Electronically Signed   By: Jacqulynn Cadet M.D.   On: 08/09/2019 08:52        Scheduled Meds: . amoxicillin-clavulanate  1 tablet Oral Q12H  . Chlorhexidine Gluconate Cloth  6 each Topical Q0600  . [START ON 08/14/2019] darbepoetin (ARANESP) injection - DIALYSIS  150 mcg Intravenous Q Wed-HD  . feeding supplement  1  Container Oral TID BM  . feeding supplement (NEPRO CARB STEADY)  237 mL Oral TID BM  . feeding supplement (PRO-STAT SUGAR FREE 64)  30 mL Oral BID  . folic acid  1 mg Oral Daily  . Gerhardt's butt cream   Topical QID  . insulin aspart  0-9 Units Subcutaneous TID WC  . miconazole nitrate   Topical BID  . midodrine  10 mg Oral Q M,W,F-HD  . multivitamin  1 tablet Oral QHS  . [START ON 08/11/2019] pantoprazole  40 mg Intravenous Q12H  . rosuvastatin  20 mg Oral q1800  . saccharomyces boulardii  250 mg Oral BID  . sevelamer carbonate  1,600 mg Oral TID WC  . vitamin B-12  500 mcg Oral Daily   Continuous Infusions: . sodium chloride 100 mL (08/05/19 2238)  . sodium chloride    . sodium chloride    . sodium chloride    . pantoprozole (PROTONIX) infusion 8 mg/hr (08/09/19 0748)     LOS: 13 days     Cordelia Poche, MD [facility] 08/09/2019, 8:55 AM  If 7PM-7AM, please contact night-coverage [email or web address]

## 2019-08-09 NOTE — Progress Notes (Signed)
Progress Note   Subjective  No further bleeding Tolerating p.o. well Denies having any abdominal pain Would like to go home   Objective   Vital signs in last 24 hours: Temp:  [98.3 F (36.8 C)-98.5 F (36.9 C)] 98.4 F (36.9 C) (12/25 0439) Pulse Rate:  [88-91] 90 (12/25 0439) Resp:  [16-18] 16 (12/25 0439) BP: (101-125)/(61-65) 125/64 (12/25 0439) SpO2:  [96 %-99 %] 99 % (12/25 0439) Last BM Date: 08/08/19 General:    white female in NAD Heart:  Regular rate and rhythm; no murmurs Lungs: Respirations even and unlabored, lungs CTA bilaterally Abdomen:  Soft, nontender and nondistended. Normal bowel sounds. Extremities:  Without edema. Neurologic:  Alert and oriented,  grossly normal neurologically. Psych:  Cooperative. Normal mood and affect.  Intake/Output from previous day: 12/24 0701 - 12/25 0700 In: 702 [P.O.:477; I.V.:225] Out: 99 [Urine:99] Intake/Output this shift: Total I/O In: 360 [P.O.:360] Out: 700 [Urine:700]  Lab Results: Recent Labs    08/07/19 1900 08/08/19 0533 08/08/19 2042 08/09/19 0517 08/09/19 1231  WBC 8.0 7.8  --  8.9  --   HGB 6.8* 9.1* 9.6* 9.4* 8.7*  HCT 21.4* 27.7* 29.5* 29.1* 26.6*  PLT 150 137*  --  135*  --    BMET Recent Labs    08/07/19 1237 08/09/19 0517  NA 127* 133*  K 4.7 4.0  CL 92* 99  CO2 24 25  GLUCOSE 249* 167*  BUN 68* 51*  CREATININE 4.22* 3.76*  CALCIUM 8.0* 8.2*   LFT Recent Labs    08/07/19 1237  ALBUMIN 1.7*   PT/INR No results for input(s): LABPROT, INR in the last 72 hours.  Studies/Results: CT Angio Abd/Pel w/ and/or w/o  Result Date: 08/09/2019 CLINICAL DATA:  78 year old female with bright red blood per rectum, suspect lower GI bleed. EXAM: CTA ABDOMEN AND PELVIS WITHOUT AND WITH CONTRAST TECHNIQUE: Multidetector CT imaging of the abdomen and pelvis was performed using the standard protocol during bolus administration of intravenous contrast. Multiplanar reconstructed images and MIPs  were obtained and reviewed to evaluate the vascular anatomy. CONTRAST:  12mL OMNIPAQUE IOHEXOL 350 MG/ML SOLN COMPARISON:  CTA chest 10/07/2008; abdominal CTA 08/01/2019 FINDINGS: VASCULAR Aorta: Severe calcified atherosclerotic plaque throughout the abdominal aorta. No evidence of aneurysm or dissection. Celiac: Heavily calcified atherosclerotic plaque results in mild to moderate stenosis at the origin of the celiac artery. SMA: Chronic long artery. Segment occlusion of the proximal superior mesenteric The artery reconstitutes via collateral flow through the pancreaticoduodenal arcade. Renals: Severe heavy calcification of the solitary right renal artery likely resulting in significant stenosis. There are multiple small left-sided renal arteries, at least 3. Calcifications are present at the artery origins. Degree of stenosis difficult to assess given small vessel size. IMA: Chronically occluded at the origin. The distal branches reconstitute. Inflow: Heavy atherosclerotic plaque in the bilateral common iliac arteries resulting in moderate to high-grade stenosis on the left, and mild to moderate stenosis on the right. The left internal iliac artery is occluded at the origin. The right internal iliac artery is patent. The external iliac arteries are relatively spared from disease. Proximal Outflow: No acute abnormality. Veins: No obvious venous abnormality within the limitations of this arterial phase study. Review of the MIP images confirms the above findings. NON-VASCULAR Lower chest: Small bilateral pleural effusions and associated atelectasis. Partially peripherally calcified circumscribed low-attenuation cystic juxta cardiac mass measuring 3.0 x 5.5 x 2.4 cm affiliated with the inferior aspect of the pericardium. This abnormality was  not present on prior imaging from 2010. There is mild local mass effect on the right ventricle. The heart is normal in size. No pericardial effusion. Patient is status post median  sternotomy. Hepatobiliary: Mildly nodular liver contour. Findings suggest underlying cirrhosis. No discrete hepatic lesion. No gallstones. The gallbladder is decompressed. However, there is gallbladder wall thickening. Pancreas: Unremarkable. No pancreatic ductal dilatation or surrounding inflammatory changes. Spleen: Mild splenomegaly. Adrenals/Urinary Tract: Unremarkable adrenal glands. Bilateral renal atrophy. Punctate parenchymal calcification in the interpolar right kidney. Small circumscribed low-attenuation lesions bilaterally are too small for accurate characterization but statistically likely cysts. No evidence of hydronephrosis. Unremarkable ureters. A Foley catheter is present in the bladder. Stomach/Bowel: Hyperenhancement and thickening of the mucosa of the rectum particularly along the posterior wall. The enhancement is visible on the venous phase images but not the arterial phase images. There is some mild perirectal interstitial stranding in the presacral space. There is no evidence of active arterial bleeding. Normal appendix. Lymphatic: No suspicious lymphadenopathy. Reproductive: Abnormal. The endometrial canal is significantly expanded up to 18 mm and is filled with intermediate attenuation fluid. No adnexal masses. Other: Trace perihepatic ascites. Musculoskeletal: No acute or significant osseous findings. Multilevel degenerative disc disease most significant at L5-S1. IMPRESSION: VASCULAR 1. No evidence of active arterial bleeding. 2. Severe and heavily calcified atherosclerotic disease involving nearly all vascular beds including the aorta. Aortic Atherosclerosis (ICD10-170.0) 3. 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 4. 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 5. High-grade stenosis of the left common iliac artery. 6. High-grade stenosis of the right renal artery. 7. Multiple small left-sided renal arteries. NON-VASCULAR 1. Hyperenhancement  and thickening of the mucosa of the rectum, particularly along the posterior wall. There is some associated inflammatory stranding in the presacral space as well. Primary differential considerations include proctitis versus rectal neoplasm. Hemorrhoids are also a possibility but would not account for the associated inflammatory stranding. This abnormality likely represents the source of the patient's hematochezia. 2. Diffuse thickening of the endometrium with fluid in the endometrial canal. This is an abnormal finding in a postmenopausal female. Differential considerations include endometrial neoplasm, hyperplasia or polyps. Recommend non emergent referral to OBGYN for endometrial biopsy. 3. Circumscribed juxta cardiac intra pericardial fluid collection in the inferior aspect of the pericardial space resulting in mild mass effect on the inferior aspect of the right ventricle. This abnormality was not present in 2010 but the patient has since undergone median sternotomy. This is favored to represent sequelae of a prior hematoma. 4. Hepatic cirrhosis with splenomegaly and trace perihepatic ascites consistent with portal hypertension. 5. Small bilateral pleural effusions and associated atelectasis. 6. Additional ancillary findings as above. Electronically Signed   By: Jacqulynn Cadet M.D.   On: 08/09/2019 08:52      Assessment / Plan:    Likely diverticular bleed (resolved).  D/d stercoral ulcer.  Flexi-Seal tube has been removed. Hb stable. CTA negative for bleed.  Did show rectal thickening.  Doubt mass given negative colon 10/2016  Plan: -Advance diet to renal diet. -Can discharge home with GI FU with Dr. Collene Mares as an outpatient.  She would like to go home. -Discussed with nursing staff -We will sign off for now.   Carmell Austria, MD Velora Heckler Fabienne Bruns [phone].     Active Problems:   Small vessel cerebrovascular accident (CVA) ([place])   Controlled type 2 diabetes mellitus with hyperglycemia, without  long-term current use of insulin ([facility])   Labile blood glucose  Chronic cystitis   Diarrhea due to malabsorption   Lethargy   ESRD on dialysis ([facility])     LOS: 13 days

## 2019-08-10 ENCOUNTER — Inpatient Hospital Stay (HOSPITAL_COMMUNITY): Payer: Medicare HMO

## 2019-08-10 ENCOUNTER — Inpatient Hospital Stay (HOSPITAL_COMMUNITY): Payer: Medicare HMO | Admitting: Occupational Therapy

## 2019-08-10 LAB — RENAL FUNCTION PANEL
Albumin: 2.1 g/dL — ABNORMAL LOW (ref 3.5–5.0)
Anion gap: 10 (ref 5–15)
BUN: 85 mg/dL — ABNORMAL HIGH (ref 8–23)
CO2: 23 mmol/L (ref 22–32)
Calcium: 8.2 mg/dL — ABNORMAL LOW (ref 8.9–10.3)
Chloride: 98 mmol/L (ref 98–111)
Creatinine, Ser: 4.86 mg/dL — ABNORMAL HIGH (ref 0.44–1.00)
GFR calc Af Amer: 9 mL/min — ABNORMAL LOW (ref >60)
GFR calc non Af Amer: 8 mL/min — ABNORMAL LOW (ref >60)
Glucose, Bld: 221 mg/dL — ABNORMAL HIGH (ref 70–99)
Phosphorus: 3.3 mg/dL (ref 2.5–4.6)
Potassium: 4.5 mmol/L (ref 3.5–5.1)
Sodium: 131 mmol/L — ABNORMAL LOW (ref 135–145)

## 2019-08-10 LAB — CBC
HCT: 26.9 % — ABNORMAL LOW (ref 36.0–46.0)
Hemoglobin: 8.7 g/dL — ABNORMAL LOW (ref 12.0–15.0)
MCH: 30.7 pg (ref 26.0–34.0)
MCHC: 32.3 g/dL (ref 30.0–36.0)
MCV: 95.1 fL (ref 80.0–100.0)
Platelets: 131 10*3/uL — ABNORMAL LOW (ref 150–400)
RBC: 2.83 MIL/uL — ABNORMAL LOW (ref 3.87–5.11)
RDW: 23.7 % — ABNORMAL HIGH (ref 11.5–15.5)
WBC: 10.5 10*3/uL (ref 4.0–10.5)
nRBC: 0 % (ref 0.0–0.2)

## 2019-08-10 LAB — GLUCOSE, CAPILLARY
Glucose-Capillary: 155 mg/dL — ABNORMAL HIGH (ref 70–99)
Glucose-Capillary: 166 mg/dL — ABNORMAL HIGH (ref 70–99)
Glucose-Capillary: 111 mg/dL — ABNORMAL HIGH (ref 70–99)
Glucose-Capillary: 267 mg/dL — ABNORMAL HIGH (ref 70–99)

## 2019-08-10 MED ORDER — HEPARIN SODIUM (PORCINE) 1000 UNIT/ML DIALYSIS: 1000.0000 [IU] | INTRAMUSCULAR | Status: DC | PRN

## 2019-08-10 MED ORDER — LIDOCAINE HCL (PF) 1 % IJ SOLN: 5.0000 mL | INTRAMUSCULAR | Status: DC | PRN

## 2019-08-10 MED ORDER — SODIUM CHLORIDE 0.9 % IV SOLN: 100.0000 mL | INTRAVENOUS | Status: DC | PRN

## 2019-08-10 MED ORDER — HEPARIN SODIUM (PORCINE) 1000 UNIT/ML IJ SOLN
INTRAMUSCULAR | Status: AC
  Filled 2019-08-10: qty 4

## 2019-08-10 MED ORDER — PENTAFLUOROPROP-TETRAFLUOROETH EX AERO: 1.0000 "application " | INHALATION_SPRAY | CUTANEOUS | Status: DC | PRN

## 2019-08-10 MED ORDER — LIDOCAINE-PRILOCAINE 2.5-2.5 % EX CREA: 1.0000 "application " | TOPICAL_CREAM | CUTANEOUS | Status: DC | PRN

## 2019-08-10 MED ORDER — ASPIRIN 81 MG PO CHEW
81.0000 mg | CHEWABLE_TABLET | Freq: Every day | ORAL | Status: DC
  Administered 2019-08-10 – 2019-08-24 (×15): 81 mg via ORAL
  Filled 2019-08-10 (×15): qty 1

## 2019-08-10 MED ORDER — ALTEPLASE 2 MG IJ SOLR: 2.0000 mg | Freq: Once | INTRAMUSCULAR | Status: DC | PRN

## 2019-08-10 NOTE — Progress Notes (Signed)
Physical Therapy Session Note  Patient Details  Name: [name] MRN: [id number] Date of Birth: 07-30-1941  Today's Date: 08/10/2019 PT Individual Time: [id number] PT Individual Time Calculation (min): 30 min  and Today's Date: 08/10/2019 PT Missed Time: 33 Minutes(lunch, dialysis) Missed Time Reason: Other (Comment)  Short Term Goals: Week 2:  PT Short Term Goal 1 (Week 2): Pt will perform slideboard transfer with max assist +2 PT Short Term Goal 2 (Week 2): Pt will maintain static sitting balance with CGA PT Short Term Goal 3 (Week 2): Pt will use standing lift equipment for LE weightbearing x 8 minutes  Skilled Therapeutic Interventions/Progress Updates:    Patient supine in bed upon PT arrival, agreeable to therapy tx and denies pain at rest. Pt eating lunch when therapist arrives, missed 10 minutes because of this. Therapist called patients husband this session in order to provide education and discuss discharge planning. Patients husband provides doorway measurement (34 inches) and says he his taking out his dining room table to fit the hospital bed. Patient and her husband both interested in getting a power chair for the patient however they will not have a way to transport it but hope to eventually get a wheelchair accessible van in the future. Also discussed transportation to/from dialysis, patients husband reports he can "just lift her in and out of the car," this therapist recommending w/c accessible public transportation however husband reports " I've seen how they take people, I don't want her to do that." This therapist will continue to provide further education and recommendations regarding this closer to discharge. Pt performed rolling in bed this session with total assist and sit<>supine total assist. Worked on sitting balance total assist, upon sitting transporter here to take patient to dialysis. Pt missed a total of 45 minutes of therapy this session secondary to dialysis and  lunch.    Therapy Documentation Precautions:  Precautions Precautions: Fall Precaution Comments: right hemiplegia Restrictions Weight Bearing Restrictions: No   Therapy/Group: Individual Therapy  Netta Corrigan, PT, DPT, CSRS  08/10/2019, 12:58 PM
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
## 2019-08-10 NOTE — Progress Notes (Signed)
PROGRESS NOTE    [name]  [id number] DOB: 02-21-1941 DOA: 07/27/2019 PCP: [name], MD   Brief Narrative: [name] is a 78 y.o. femalein Rrehab unit, with PMH of HTN, HLD, DM-2, systolic CHF, ESRD on HD MWF, CAD/CABG in 2010, AS, right thalamic CVA in 2018 and recent hospitalization for left ACA infarct. TRH consulted for concern of sepsis in setting of UTI. Patient treated with antibiotics and now is having BRBPR. GI consulted.    Assessment & Plan:   Active Problems:   Lower GI bleeding   Small vessel cerebrovascular accident (CVA) ([facility])   Controlled type 2 diabetes mellitus with hyperglycemia, without long-term current use of insulin ([facility])   Labile blood glucose   Chronic cystitis   Diarrhea due to malabsorption   Lethargy   ESRD on dialysis ([facility])   Sepsis secondary to UTI Not present on admission. Sepsis physiology resolved. Afebrile with no leukocytosis.  UTI Culture significant for Klebsiella pneumonia and enterococcus faecalis. Initially treated with Ceftriaxone, transitioned to Vancomycin and Zosyn and now transitioned to Augmentin. Agree with 5 day total course of treatment since blood culture no growth to date. Symptoms have resolved.  Adynamic ileus Seen on abdominal x-ray. Patient with bowel movements related to below. C. Difficile tested and was negative.   Bright red blood per rectum Acute GI bleed. Unknown if upper/lower GI bleed. Patient with a colonoscopy from 2018 significant for ulcers/erosions in the sigmoid/ascending colon in addition to sigmoid diverticulosis. Complicated by aspirin and Plavix use in the setting of recent CVA. Plavix discontinued and patient started on Protonix IV. Blood pressure is soft but stable in setting HD and chronic hypotension. Received 2 units of PRBC to date. Likely diverticular bleed. Hemoglobin likely equilibrating but not dropping significantly. CTA abdomen/pelvis significant for rectal thickening for which  GI thinks is not related to a mass. Per radiology read, thickening likely represents source of bleeding. -Daily CBC -Watch BMs -Holding aspirin and plavix  Chronic hypotension Stable. -Continue midodrine  History of CVA Currently undergoing rehabilitation. Was discharged on aspirin and Plavix. Plavix held secondary to above. -Continue Crestor -Will hold aspirin for now pending GI recommendations  ESRD -Per nephrology  Chronic systolic heart failure Stable. EF of 25-30%. Volume managed with HD.  Endometrial thickening Concerning for neoplasm vs hyperplasia vs polyps and will need outpatient Gyn follow-up.  Hepatic cirrhosis Splenomegaly Noted on CTA. Associated perihepatic ascites.   Diabetes mellitus, type 2 -Continue SSI  Pressure injury Sacrum and right buttock, POA.   DVT prophylaxis: SCDs Code Status:   Code Status: Full Code Family Communication: None    Antimicrobials:  Ceftriaxone  Vancomycin  Zosyn  Augmentin    Subjective: No concerns today. No blood stools but no BM in general  Objective: Vitals:   08/09/19 1510 08/09/19 1914 08/10/19 0404 08/10/19 0404  BP: (!) 119/55 (!) 107/49 (!) 116/51 (!) 116/51  Pulse: 79 82 82 80  Resp: 18 17 17 17   Temp: 98.4 F (36.9 C) 99.1 F (37.3 C) 98.3 F (36.8 C) 98.3 F (36.8 C)  TempSrc:      SpO2: 98% 100% 94% 94%  Weight:      Height:        Intake/Output Summary (Last 24 hours) at 08/10/2019 1406 Last data filed at 08/10/2019 0437 Gross per 24 hour  Intake 180 ml  Output 275 ml  Net -95 ml   Filed Weights   08/07/19 0251 08/07/19 1219 08/07/19 1633  Weight:  58.1 kg 55.4 kg 55.3 kg    Examination:  General exam: Appears calm and comfortable Respiratory system: Clear to auscultation. Respiratory effort normal. Cardiovascular system: S1 & S2 heard, RRR. Systolic murmur Gastrointestinal system: Abdomen is nondistended, soft and nontender. No organomegaly or masses felt. Normal bowel  sounds heard. Central nervous system: Alert and oriented. No focal neurological deficits. Extremities: No edema. No calf tenderness Skin: No cyanosis. No rashes Psychiatry: Judgement and insight appear normal. Mood & affect appropriate.       Data Reviewed: I have personally reviewed following labs and imaging studies  CBC: Recent Labs  Lab 08/05/19 0203 08/07/19 0607 08/07/19 1237 08/07/19 1900 08/08/19 0533 08/08/19 2042 08/09/19 0517 08/09/19 1231 08/09/19 2106 08/10/19 0704  WBC 12.4* 9.2 10.3 8.0 7.8  --  8.9  --   --  10.5  NEUTROABS 9.2* 6.9  --   --   --   --   --   --   --   --   HGB 9.3* 10.1* 7.6* 6.8* 9.1* 9.6* 9.4* 8.7* 7.9* 8.7*  HCT 28.2* 31.4* 24.4* 21.4* 27.7* 29.5* 29.1* 26.6* 24.3* 26.9*  MCV 105.2* 105.4* 107.5* 105.9* 91.4  --  93.9  --   --  95.1  PLT 141* 146* 163 150 137*  --  135*  --   --  [id number]*   Basic Metabolic Panel: Recent Labs  Lab 08/04/19 1025 08/07/19 1237 08/09/19 0517  NA 130* 127* 133*  K 3.5 4.7 4.0  CL 94* 92* 99  CO2 21* 24 25  GLUCOSE 155* 249* 167*  BUN 80* 68* 51*  CREATININE 5.36* 4.22* 3.76*  CALCIUM 8.7* 8.0* 8.2*  PHOS  --  3.6  --    GFR: Estimated Creatinine Clearance: 10.9 mL/min (A) (by C-G formula based on SCr of 3.76 mg/dL (H)). Liver Function Tests: Recent Labs  Lab 08/04/19 1025 08/07/19 1237  AST 22  --   ALT 17  --   ALKPHOS 77  --   BILITOT 0.5  --   PROT 5.9*  --   ALBUMIN 2.2* 1.7*   No results for input(s): LIPASE, AMYLASE in the last 168 hours. No results for input(s): AMMONIA in the last 168 hours. Coagulation Profile: No results for input(s): INR, PROTIME in the last 168 hours. Cardiac Enzymes: No results for input(s): CKTOTAL, CKMB, CKMBINDEX, TROPONINI in the last 168 hours. BNP (last 3 results) No results for input(s): PROBNP in the last 8760 hours. HbA1C: No results for input(s): HGBA1C in the last 72 hours. CBG: Recent Labs  Lab 08/09/19 1151 08/09/19 1653 08/09/19 2113  08/10/19 0601 08/10/19 1232  GLUCAP 256* 175* 128* 155* 166*   Lipid Profile: No results for input(s): CHOL, HDL, LDLCALC, TRIG, CHOLHDL, LDLDIRECT in the last 72 hours. Thyroid Function Tests: No results for input(s): TSH, T4TOTAL, FREET4, T3FREE, THYROIDAB in the last 72 hours. Anemia Panel: Recent Labs    08/09/19 0517  VITAMINB12 1,361*  FOLATE 23.9  FERRITIN 1,402*  TIBC 193*  IRON 56  RETICCTPCT 1.7   Sepsis Labs: Recent Labs  Lab 08/04/19 2243 08/05/19 0203  LATICACIDVEN 0.8 0.5    Recent Results (from the past 240 hour(s))  Culture, Urine     Status: Abnormal   Collection Time: 07/31/19 10:05 PM   Specimen: Urine, Clean Catch  Result Value Ref Range Status   Specimen Description URINE, CLEAN CATCH  Final   Special Requests   Final    Normal Performed at [facility], [place] [address] [place], [place] [address]    Culture MULTIPLE SPECIES PRESENT, SUGGEST RECOLLECTION (A)  Final   Report Status 08/01/2019 FINAL  Final  C difficile quick scan w PCR reflex     Status: None   Collection Time: 07/31/19 10:05 PM   Specimen: Urine, Clean Catch; Stool  Result Value Ref Range Status   C Diff antigen NEGATIVE NEGATIVE Final   C Diff toxin NEGATIVE NEGATIVE Final   C Diff interpretation No C. difficile detected.  Final    Comment: Performed at [facility], [place] [address] [place], [place] [address]  Culture, Urine     Status: Abnormal   Collection Time: 08/04/19 11:00 AM   Specimen: Urine, Random  Result Value Ref Range Status   Specimen Description URINE, RANDOM  Final   Special Requests   Final    NONE Performed at [facility], [place] [address], [place], [place] [address]    Culture (A)  Final    >=100,000 COLONIES/mL KLEBSIELLA PNEUMONIAE >=100,000 COLONIES/mL ENTEROCOCCUS FAECALIS    Report Status 08/07/2019 FINAL  Final   Organism ID, Bacteria KLEBSIELLA PNEUMONIAE (A)  Final   Organism ID, Bacteria ENTEROCOCCUS FAECALIS (A)  Final       Susceptibility   Enterococcus faecalis - MIC*    AMPICILLIN <=2 SENSITIVE Sensitive     NITROFURANTOIN <=16 SENSITIVE Sensitive     VANCOMYCIN 1 SENSITIVE Sensitive     * >=100,000 COLONIES/mL ENTEROCOCCUS FAECALIS   Klebsiella pneumoniae - MIC*    AMPICILLIN >=32 RESISTANT Resistant     CEFAZOLIN <=4 SENSITIVE Sensitive     CEFTRIAXONE <=1 SENSITIVE Sensitive     CIPROFLOXACIN <=0.25 SENSITIVE Sensitive     GENTAMICIN <=1 SENSITIVE Sensitive     IMIPENEM <=0.25 SENSITIVE Sensitive     NITROFURANTOIN 64 INTERMEDIATE Intermediate     TRIMETH/SULFA <=20 SENSITIVE Sensitive     AMPICILLIN/SULBACTAM <=2 SENSITIVE Sensitive     PIP/TAZO <=4 SENSITIVE Sensitive     * >=100,000 COLONIES/mL KLEBSIELLA PNEUMONIAE  Culture, blood (routine x 2)     Status: None   Collection Time: 08/04/19 10:43 PM   Specimen: BLOOD  Result Value Ref Range Status   Specimen Description BLOOD LEFT HAND  Final   Special Requests   Final    BOTTLES DRAWN AEROBIC AND ANAEROBIC Blood Culture results may not be optimal due to an inadequate volume of blood received in culture bottles   Culture   Final    NO GROWTH 5 DAYS Performed at [place] [address] [place], [place] [address]    Report Status 08/09/2019 FINAL  Final  Culture, blood (routine x 2)     Status: None   Collection Time: 08/04/19 10:46 PM   Specimen: BLOOD  Result Value Ref Range Status   Specimen Description BLOOD LEFT HAND  Final   Special Requests   Final    BOTTLES DRAWN AEROBIC AND ANAEROBIC Blood Culture adequate volume   Culture   Final    NO GROWTH 5 DAYS Performed at [facility], [place] [address], [place], [place] [address]    Report Status 08/09/2019 FINAL  Final  Wet prep, genital     Status: Abnormal   Collection Time: 08/06/19  6:31 PM   Specimen: Vaginal; Genital  Result Value Ref Range Status   Yeast Wet Prep HPF POC NONE SEEN NONE SEEN Final   Trich, Wet Prep NONE SEEN NONE SEEN  Final   Clue Cells Wet  Prep HPF POC NONE SEEN NONE SEEN Final   WBC, Wet Prep HPF POC MANY (A) NONE SEEN Final   Sperm NONE SEEN  Final    Comment: Performed at [facility], [place] [address], [place], [place] [address]         Radiology Studies: CT Angio Abd/Pel w/ and/or w/o  Result Date: 08/09/2019 CLINICAL DATA:  78 year old female with bright red blood per rectum, suspect lower GI bleed. EXAM: CTA ABDOMEN AND PELVIS WITHOUT AND WITH CONTRAST TECHNIQUE: Multidetector CT imaging of the abdomen and pelvis was performed using the standard protocol during bolus administration of intravenous contrast. Multiplanar reconstructed images and MIPs were obtained and reviewed to evaluate the vascular anatomy. CONTRAST:  189mL OMNIPAQUE IOHEXOL 350 MG/ML SOLN COMPARISON:  CTA chest 10/07/2008; abdominal CTA 08/01/2019 FINDINGS: VASCULAR Aorta: Severe calcified atherosclerotic plaque throughout the abdominal aorta. No evidence of aneurysm or dissection. Celiac: Heavily calcified atherosclerotic plaque results in mild to moderate stenosis at the origin of the celiac artery. SMA: Chronic long artery. Segment occlusion of the proximal superior mesenteric The artery reconstitutes via collateral flow through the pancreaticoduodenal arcade. Renals: Severe heavy calcification of the solitary right renal artery likely resulting in significant stenosis. There are multiple small left-sided renal arteries, at least 3. Calcifications are present at the artery origins. Degree of stenosis difficult to assess given small vessel size. IMA: Chronically occluded at the origin. The distal branches reconstitute. Inflow: Heavy atherosclerotic plaque in the bilateral common iliac arteries resulting in moderate to high-grade stenosis on the left, and mild to moderate stenosis on the right. The left internal iliac artery is occluded at the origin. The right internal iliac artery is patent. The external iliac arteries are relatively spared from disease.  Proximal Outflow: No acute abnormality. Veins: No obvious venous abnormality within the limitations of this arterial phase study. Review of the MIP images confirms the above findings. NON-VASCULAR Lower chest: Small bilateral pleural effusions and associated atelectasis. Partially peripherally calcified circumscribed low-attenuation cystic juxta cardiac mass measuring 3.0 x 5.5 x 2.4 cm affiliated with the inferior aspect of the pericardium. This abnormality was not present on prior imaging from 2010. There is mild local mass effect on the right ventricle. The heart is normal in size. No pericardial effusion. Patient is status post median sternotomy. Hepatobiliary: Mildly nodular liver contour. Findings suggest underlying cirrhosis. No discrete hepatic lesion. No gallstones. The gallbladder is decompressed. However, there is gallbladder wall thickening. Pancreas: Unremarkable. No pancreatic ductal dilatation or surrounding inflammatory changes. Spleen: Mild splenomegaly. Adrenals/Urinary Tract: Unremarkable adrenal glands. Bilateral renal atrophy. Punctate parenchymal calcification in the interpolar right kidney. Small circumscribed low-attenuation lesions bilaterally are too small for accurate characterization but statistically likely cysts. No evidence of hydronephrosis. Unremarkable ureters. A Foley catheter is present in the bladder. Stomach/Bowel: Hyperenhancement and thickening of the mucosa of the rectum particularly along the posterior wall. The enhancement is visible on the venous phase images but not the arterial phase images. There is some mild perirectal interstitial stranding in the presacral space. There is no evidence of active arterial bleeding. Normal appendix. Lymphatic: No suspicious lymphadenopathy. Reproductive: Abnormal. The endometrial canal is significantly expanded up to 18 mm and is filled with intermediate attenuation fluid. No adnexal masses. Other: Trace perihepatic ascites.  Musculoskeletal: No acute or significant osseous findings. Multilevel degenerative disc disease most significant at L5-S1. IMPRESSION: VASCULAR 1. No evidence of active arterial bleeding. 2. Severe and heavily calcified atherosclerotic disease  involving nearly all vascular beds including the aorta. Aortic Atherosclerosis (ICD10-170.0) 3. 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 4. 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 5. High-grade stenosis of the left common iliac artery. 6. High-grade stenosis of the right renal artery. 7. Multiple small left-sided renal arteries. NON-VASCULAR 1. Hyperenhancement and thickening of the mucosa of the rectum, particularly along the posterior wall. There is some associated inflammatory stranding in the presacral space as well. Primary differential considerations include proctitis versus rectal neoplasm. Hemorrhoids are also a possibility but would not account for the associated inflammatory stranding. This abnormality likely represents the source of the patient's hematochezia. 2. Diffuse thickening of the endometrium with fluid in the endometrial canal. This is an abnormal finding in a postmenopausal female. Differential considerations include endometrial neoplasm, hyperplasia or polyps. Recommend non emergent referral to OBGYN for endometrial biopsy. 3. Circumscribed juxta cardiac intra pericardial fluid collection in the inferior aspect of the pericardial space resulting in mild mass effect on the inferior aspect of the right ventricle. This abnormality was not present in 2010 but the patient has since undergone median sternotomy. This is favored to represent sequelae of a prior hematoma. 4. Hepatic cirrhosis with splenomegaly and trace perihepatic ascites consistent with portal hypertension. 5. Small bilateral pleural effusions and associated atelectasis. 6. Additional ancillary findings as above. Electronically Signed   By:  Jacqulynn Cadet M.D.   On: 08/09/2019 08:52        Scheduled Meds: . aspirin  81 mg Oral Daily  . Chlorhexidine Gluconate Cloth  6 each Topical Q0600  . [START ON 08/14/2019] darbepoetin (ARANESP) injection - DIALYSIS  150 mcg Intravenous Q Wed-HD  . feeding supplement  1 Container Oral TID BM  . feeding supplement (NEPRO CARB STEADY)  237 mL Oral TID BM  . feeding supplement (PRO-STAT SUGAR FREE 64)  30 mL Oral BID  . folic acid  1 mg Oral Daily  . Gerhardt's butt cream   Topical QID  . insulin aspart  0-9 Units Subcutaneous TID WC  . miconazole nitrate   Topical BID  . midodrine  10 mg Oral Q M,W,F-HD  . multivitamin  1 tablet Oral QHS  . [START ON 08/11/2019] pantoprazole  40 mg Intravenous Q12H  . rosuvastatin  20 mg Oral q1800  . saccharomyces boulardii  250 mg Oral BID  . sevelamer carbonate  1,600 mg Oral TID WC  . vitamin B-12  500 mcg Oral Daily   Continuous Infusions: . sodium chloride 100 mL (08/05/19 2238)  . sodium chloride    . sodium chloride    . sodium chloride    . sodium chloride    . sodium chloride    . pantoprozole (PROTONIX) infusion 8 mg/hr (08/10/19 0437)     LOS: 14 days     Cordelia Poche, MD [facility] 08/10/2019, 2:06 PM  If 7PM-7AM, please contact night-coverage [email or web address]

## 2019-08-10 NOTE — Progress Notes (Signed)
Patient ID: [name], female   DOB: 11-Nov-1940, 78 y.o.   MRN: [id number]  [facility] Progress Note   Assessment/ Plan:   1. 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  Ongoing inpatient OT/PT. 2. ESRD: Usually on a Monday/Wednesday/Friday dialysis schedule with next dialysis treatment planned for today per holiday schedule (heparin free).  She is not hypervolemic. 3. Anemia: With anemia of chronic kidney disease likely compounded by recent suspected diverticular bleed that has resolved.  We will continue ESA and monitoring of H/H with plan noted for outpatient GI follow-up.  No heparin. 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 5. Nutrition: Continue renal diet with advancement per GI recommendations.  Continue renal multivitamin/ONS 6. Hypertension: Blood pressure currently within acceptable range, continue to follow with HD.  Subjective:   Reports to be feeling fair, she thought that she was supposed to get dialysis yesterday.   Objective:   BP (!) 116/51 (BP Location: Right Arm)   Pulse 80   Temp 98.3 F (36.8 C)   Resp 17   Ht 5\' 8"  (1.727 m)   Wt 55.3 kg   SpO2 94%   BMI 18.54 kg/m   Physical Exam: Gen: Comfortably resting in bed, watching television CVS: Pulse regular rhythm, normal rate, S1 and S2 normal Resp: Diminished breath sounds over bases otherwise clear to auscultation.  Right IJ TDC. Abd: Soft, flat, nontender Ext: Right leg in supportive brace, no edema  Labs: BMET Recent Labs  Lab 08/04/19 1025 08/07/19 1237 08/09/19 0517  NA 130* 127* 133*  K 3.5 4.7 4.0  CL 94* 92* 99  CO2 21* 24 25  GLUCOSE 155* 249* 167*  BUN 80* 68* 51*  CREATININE 5.36* 4.22* 3.76*  CALCIUM 8.7* 8.0* 8.2*  PHOS  --  3.6  --    CBC Recent Labs  Lab 08/05/19 0203 08/07/19 0607 08/07/19 1900 08/08/19 0533 08/09/19 0517  08/09/19 1231 08/09/19 2106 08/10/19 0704  WBC 12.4* 9.2 8.0 7.8 8.9  --   --  10.5  NEUTROABS 9.2* 6.9  --   --   --   --   --   --   HGB 9.3* 10.1* 6.8* 9.1* 9.4* 8.7* 7.9* 8.7*  HCT 28.2* 31.4* 21.4* 27.7* 29.1* 26.6* 24.3* 26.9*  MCV 105.2* 105.4* 105.9* 91.4 93.9  --   --  95.1  PLT 141* 146* 150 137* 135*  --   --  131*   Medications:    . Chlorhexidine Gluconate Cloth  6 each Topical Q0600  . [START ON 08/14/2019] darbepoetin (ARANESP) injection - DIALYSIS  150 mcg Intravenous Q Wed-HD  . feeding supplement  1 Container Oral TID BM  . feeding supplement (NEPRO CARB STEADY)  237 mL Oral TID BM  . feeding supplement (PRO-STAT SUGAR FREE 64)  30 mL Oral BID  . folic acid  1 mg Oral Daily  . Gerhardt's butt cream   Topical QID  . insulin aspart  0-9 Units Subcutaneous TID WC  . miconazole nitrate   Topical BID  . midodrine  10 mg Oral Q M,W,F-HD  . multivitamin  1 tablet Oral QHS  . [START ON 08/11/2019] pantoprazole  40 mg Intravenous Q12H  . rosuvastatin  20 mg Oral q1800  . saccharomyces boulardii  250 mg Oral BID  . sevelamer carbonate  1,600 mg Oral TID WC  . vitamin  B-12  500 mcg Oral Daily   Elmarie Shiley, MD 08/10/2019, 9:32 AM

## 2019-08-10 NOTE — Progress Notes (Signed)
Speech Language Pathology Daily Session Note  Patient Details  Name: [name] MRN: [id number] Date of Birth: Oct 29, 1940  Today's Date: 08/10/2019 SLP Individual Time: 0803-0900 SLP Individual Time Calculation (min): 57 min  Short Term Goals: Week 2: SLP Short Term Goal 1 (Week 2): Pt will use external aids to recall daily information with Supervision assist verbal cues. SLP Short Term Goal 2 (Week 2): Pt will complete mildly complex tasks with min assist verbal cues for functional problem solving. SLP Short Term Goal 3 (Week 2): Pt will locate items to right of midline with Supervision A verbal/visual cues. SLP Short Term Goal 4 (Week 2): Pt will selectively attend to tasks with Min A verbal cues for redirection.  Skilled Therapeutic Interventions: Skilled ST services focused on cognitive skills. Pt demonstrate ability to navigate breakfast tray with supervision A verbal cues for problem solving and supervision A verbal cues to scan right of midline. Pt demonstrated increase recall, recalled 3/3 rules from card task utilized by Probation officer in last treatment session, recall of today's schedule following 5 minute delay and then again at the end of the session. SLP facilitated education and recall of medication name/function/times per day, requiring supervision A verbal cues. Pt indicated consuming medication x2 and x3 per day incorrectly at home but able to recall correct times per day with supervision A verbal cues. SLP recommends completing medication pill organizer task in upcoming sessions. Pt was left in room with call bell within reach and bed alarm set. ST recommends to continue skilled ST services.      Pain Pain Assessment Pain Scale: 0-10 Pain Score: 0-No pain Pain Type: Acute pain Pain Location: Leg Pain Orientation: Right Pain Descriptors / Indicators: Aching Pain Frequency: Occasional Patients Stated Pain Goal: 2 Pain Intervention(s): Medication (See eMAR)  Therapy/Group:  Individual Therapy  Erykah Lippert  [facility] 08/10/2019, 12:53 PM

## 2019-08-11 ENCOUNTER — Inpatient Hospital Stay (HOSPITAL_COMMUNITY): Payer: Medicare HMO

## 2019-08-11 ENCOUNTER — Inpatient Hospital Stay (HOSPITAL_COMMUNITY): Payer: Medicare HMO | Admitting: Speech Pathology

## 2019-08-11 LAB — CBC
HCT: 26 % — ABNORMAL LOW (ref 36.0–46.0)
Hemoglobin: 8.2 g/dL — ABNORMAL LOW (ref 12.0–15.0)
MCH: 30.4 pg (ref 26.0–34.0)
MCHC: 31.5 g/dL (ref 30.0–36.0)
MCV: 96.3 fL (ref 80.0–100.0)
Platelets: 144 10*3/uL — ABNORMAL LOW (ref 150–400)
RBC: 2.7 MIL/uL — ABNORMAL LOW (ref 3.87–5.11)
RDW: 23.6 % — ABNORMAL HIGH (ref 11.5–15.5)
WBC: 9 10*3/uL (ref 4.0–10.5)
nRBC: 0.2 % (ref 0.0–0.2)

## 2019-08-11 LAB — GLUCOSE, CAPILLARY
Glucose-Capillary: 131 mg/dL — ABNORMAL HIGH (ref 70–99)
Glucose-Capillary: 182 mg/dL — ABNORMAL HIGH (ref 70–99)
Glucose-Capillary: 213 mg/dL — ABNORMAL HIGH (ref 70–99)
Glucose-Capillary: 147 mg/dL — ABNORMAL HIGH (ref 70–99)

## 2019-08-11 MED ORDER — CARBAMIDE PEROXIDE 6.5 % OT SOLN
5.0000 [drp] | Freq: Two times a day (BID) | OTIC | Status: DC
  Administered 2019-08-11 – 2019-08-23 (×18): 5 [drp] via OTIC
  Filled 2019-08-11: qty 15

## 2019-08-11 NOTE — Progress Notes (Signed)
Patient slept on and off throughout the night. No vaginal or rectal bleeding noted during shift. Pt complained of pain in beginning of shift and prn pain med was given.

## 2019-08-11 NOTE — Progress Notes (Signed)
Patient ID: [name], female   DOB: [birth year], 78 y.o.   MRN: [id number]  [facility] Progress Note   Assessment/ Plan:   1. 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  Ongoing inpatient OT/PT. 2. ESRD: Usually on a Monday/Wednesday/Friday dialysis schedule and underwent hemodialysis yesterday per holiday schedule.  She does not have acute indications for dialysis today and will undergo her usual outpatient hemodialysis schedule tomorrow. 3. Anemia: With anemia of chronic kidney disease likely compounded by recent suspected diverticular bleed that has resolved.  Continue heparin free dialysis.  No additional GI bleed noted. 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 5. Nutrition: Continue renal diet with advancement per GI recommendations.  Continue renal multivitamin/ONS 6. Hypertension: Blood pressure currently within acceptable range, continue to follow with HD.  Subjective:   Underwent hemodialysis yesterday without problems, denies any complaints at this time.   Objective:   BP (!) 109/59 (BP Location: Right Arm)   Pulse 84   Temp 97.8 F (36.6 C)   Resp 18   Ht 5\' 8"  (1.727 m)   Wt 54 kg   SpO2 97%   BMI 18.10 kg/m   Physical Exam: Gen: Sitting up in bed, working with OT CVS: Pulse regular rhythm, normal rate, S1 and S2 normal Resp: Diminished breath sounds over bases otherwise clear to auscultation.  Right IJ TDC. Abd: Soft, flat, nontender Ext: Right leg in supportive brace, no edema  Labs: BMET Recent Labs  Lab 08/04/19 1025 08/07/19 1237 08/09/19 0517 08/10/19 1412  NA 130* 127* 133* 131*  K 3.5 4.7 4.0 4.5  CL 94* 92* 99 98  CO2 21* 24 25 23   GLUCOSE 155* 249* 167* 221*  BUN 80* 68* 51* 85*  CREATININE 5.36* 4.22* 3.76* 4.86*  CALCIUM 8.7* 8.0* 8.2* 8.2*  PHOS  --  3.6  --  3.3   CBC Recent Labs  Lab  08/05/19 0203 08/07/19 0607 08/08/19 0533 08/09/19 0517 08/09/19 1231 08/09/19 2106 08/10/19 0704 08/11/19 0633  WBC 12.4* 9.2 7.8 8.9  --   --  10.5 9.0  NEUTROABS 9.2* 6.9  --   --   --   --   --   --   HGB 9.3* 10.1* 9.1* 9.4* 8.7* 7.9* 8.7* 8.2*  HCT 28.2* 31.4* 27.7* 29.1* 26.6* 24.3* 26.9* 26.0*  MCV 105.2* 105.4* 91.4 93.9  --   --  95.1 96.3  PLT 141* 146* 137* 135*  --   --  131* 144*   Medications:    . aspirin  81 mg Oral Daily  . Chlorhexidine Gluconate Cloth  6 each Topical Q0600  . [START ON 08/14/2019] darbepoetin (ARANESP) injection - DIALYSIS  150 mcg Intravenous Q Wed-HD  . feeding supplement  1 Container Oral TID BM  . feeding supplement (NEPRO CARB STEADY)  237 mL Oral TID BM  . feeding supplement (PRO-STAT SUGAR FREE 64)  30 mL Oral BID  . folic acid  1 mg Oral Daily  . Gerhardt's butt cream   Topical QID  . insulin aspart  0-9 Units Subcutaneous TID WC  . miconazole nitrate   Topical BID  . midodrine  10 mg Oral Q M,W,F-HD  . multivitamin  1 tablet Oral QHS  . pantoprazole  40 mg Intravenous Q12H  . rosuvastatin  20 mg Oral q1800  . saccharomyces boulardii  250 mg Oral  BID  . sevelamer carbonate  1,600 mg Oral TID WC  . vitamin B-12  500 mcg Oral Daily   Elmarie Shiley, MD 08/11/2019, 9:09 AM

## 2019-08-11 NOTE — Progress Notes (Signed)
Physical Therapy Session Note  Patient Details  Name: [name] MRN: [id number] Date of Birth: 01-04-1941  Today's Date: 08/11/2019 PT Individual Time: 1001-1059 PT Individual Time Calculation (min): 58 min   Short Term Goals: Week 2:  PT Short Term Goal 1 (Week 2): Pt will perform beasy board transfer with max assist +2 PT Short Term Goal 2 (Week 2): Pt will maintain static sitting balance with CGA PT Short Term Goal 3 (Week 2): Pt will use standing lift equipment for LE weightbearing x 8 minutes  Skilled Therapeutic Interventions/Progress Updates:    Pt supine in bed upon PT arrival, agreeable to therapy tx and denies pain at rest. Session focused on family education with patients husband and trial of power wheelchair this session for increased independence with mobility. Pt performed rolling this session with mod assist to the R and total assist for rolling to the L, x 2 in each direction to don pants and then to place hoyer lift sling, education provided to husband on techniques for rolling. Pt transferred dependently this session using the manual hoyer lift with +2 assist, therapist providing verbal education this session to husband on how the lift is used, recommending for all transfers when patient discharges. Pt transferred to power wheelchair this session. Pt educated on power w/c control, how to steer and how to tilt for pressure relief, however this power chair has controls on the R side and she will need controls on her L since she has R hemiplegia. Pt attempted to drive power chair today but unable to successfully because of the placement on control/joystick. Pt performed beasy board transfer this session with max assist +2 from w/c<>mat, therapist providing education to husband on techniques. Pt worked on static and dyanmic sitting balance this session, midline orientation and increased thoracic extensor activation in sitting with CGA-mod assist depending on fatigue level. Cues to  limit pushing with L UE and to limit R lean/LOB. Pt transported back to room and left in power chair tilted for pressure relief. Therapist and pt's husband discussed transportation to/from dialysis, he reports that they do have a hoyer lift at the center, he is still unsure if he would have his wife use transportation as he wants to transport her himself in his vehicle.    Therapy Documentation Precautions:  Precautions Precautions: Fall Precaution Comments: right hemiplegia Restrictions Weight Bearing Restrictions: No    Therapy/Group: Individual Therapy  Netta Corrigan, PT, DPT, CSRS 08/11/2019, 7:49 AM

## 2019-08-11 NOTE — Progress Notes (Signed)
Speech Language Pathology Daily Session Note  Patient Details  Name: [name] MRN: [id number] Date of Birth: 1940-08-31  Today's Date: 08/11/2019 SLP Individual Time: 0803-0900 SLP Individual Time Calculation (min): 57 min  Short Term Goals: Week 2: SLP Short Term Goal 1 (Week 2): Pt will use external aids to recall daily information with Supervision assist verbal cues. SLP Short Term Goal 2 (Week 2): Pt will complete mildly complex tasks with min assist verbal cues for functional problem solving. SLP Short Term Goal 3 (Week 2): Pt will locate items to right of midline with Supervision A verbal/visual cues. SLP Short Term Goal 4 (Week 2): Pt will selectively attend to tasks with Min A verbal cues for redirection.  Skilled Therapeutic Interventions:  Pt was seen for skilled ST targeting cognitive goals.  Upon arrival, pt was awake in bed and agreeable to participating in Heath.  Pt reported that one of her hearing aids did not appear to be working and asked if therapist could change the battery.  Pt was able to verbalize the steps for changing battery and cleaning her left hearing aid with supervision question cues.  It appears that there is wax build up on her hearing aid which is impacting its function and unfortunately the brush to clean it could not be found in pt's room.  Left hearing aid and replacement batteries were left on the windowsill in pt's room per her request.    Pt could recall medication management activities from previous therapy session, including location of medication list in her room.  Pt needed min cues for task organization and thoroughness due to decreased attention to task in the setting of working memory and processing speed deficits.  Pt also benefited from min verbal cues to fill boxes to the right of midline in the pill organizer  Pt was left in bed with bed alarm set and call bell within reach.  Continue per current plan of care.    Pain Pain Assessment Pain  Scale: 0-10 Pain Score: 0-No pain  Therapy/Group: Individual Therapy  Catalino Plascencia, Selinda Orion 08/11/2019, 12:25 PM
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
## 2019-08-11 NOTE — Progress Notes (Signed)
PROGRESS NOTE    [name]  [id number] DOB: 1941/01/21 DOA: 07/27/2019 PCP: [name], MD   Brief Narrative: [name] is a 78 y.o. femalein Rrehab unit, with PMH of HTN, HLD, DM-2, systolic CHF, ESRD on HD MWF, CAD/CABG in 2010, AS, right thalamic CVA in 2018 and recent hospitalization for left ACA infarct. TRH consulted for concern of sepsis in setting of UTI. Patient treated with antibiotics and now is having BRBPR. GI consulted.    Assessment & Plan:   Active Problems:   Lower GI bleeding   Small vessel cerebrovascular accident (CVA) ([place])   Controlled type 2 diabetes mellitus with hyperglycemia, without long-term current use of insulin (HCC)   Labile blood glucose   Chronic cystitis   Diarrhea due to malabsorption   Lethargy   ESRD on dialysis ([place])   Sepsis secondary to UTI Not present on admission. Sepsis physiology resolved. Afebrile with no leukocytosis.  UTI Culture significant for Klebsiella pneumonia and enterococcus faecalis. Initially treated with Ceftriaxone, transitioned to Vancomycin and Zosyn and now transitioned to Augmentin. Agree with 5 day total course of treatment since blood culture no growth to date. Symptoms have resolved.  Adynamic ileus Seen on abdominal x-ray. Patient with bowel movements related to below. C. Difficile tested and was negative.   Bright red blood per rectum Acute GI bleed. Unknown if upper/lower GI bleed. Patient with a colonoscopy from 2018 significant for ulcers/erosions in the sigmoid/ascending colon in addition to sigmoid diverticulosis. Complicated by aspirin and Plavix use in the setting of recent CVA. Plavix discontinued and patient started on Protonix IV. Blood pressure is soft but stable in setting HD and chronic hypotension. Received 2 units of PRBC to date. Likely diverticular bleed. CTA abdomen/pelvis significant for rectal thickening for which GI thinks is not related to a mass. Per radiology read,  thickening likely represents source of bleeding. Hemoglobin mostly stable. No further bleeding. -Daily CBC -Watch BMs  Chronic hypotension Stable. -Continue midodrine  History of CVA Currently undergoing rehabilitation. Was discharged on aspirin and Plavix. Plavix held secondary to above. -Continue Crestor and aspirin 81 mg  ESRD -Per nephrology  Chronic systolic heart failure Stable. EF of 25-30%. Volume managed with HD.  Endometrial thickening Concerning for neoplasm vs hyperplasia vs polyps and will need outpatient Gyn follow-up. Discussed with patient at bedside.  Hepatic cirrhosis Splenomegaly Noted on CTA. Associated perihepatic ascites. Will need PCP follow-up. Watch fluid status carefully in setting of this diagnosis, heart failure and ESRD.  Diabetes mellitus, type 2 -Continue SSI  Pressure injury Sacrum and right buttock, POA.   DVT prophylaxis: SCDs Code Status:   Code Status: Full Code Family Communication: None    Antimicrobials:  Ceftriaxone  Vancomycin  Zosyn  Augmentin    Subjective: No issues. No bloody BMs  Objective: Vitals:   08/10/19 1700 08/10/19 1730 08/10/19 2051 08/11/19 0456  BP: 107/61 119/60 108/60 (!) 109/59  Pulse: 84 84 87 84  Resp:  16 17 18   Temp:  98 F (36.7 C) 98.8 F (37.1 C) 97.8 F (36.6 C)  TempSrc:  Oral    SpO2:  98% 99% 97%  Weight:  54 kg    Height:        Intake/Output Summary (Last 24 hours) at 08/11/2019 1007 Last data filed at 08/11/2019 0420 Gross per 24 hour  Intake 357 ml  Output 3025 ml  Net -2668 ml   Filed Weights   08/07/19 1633 08/10/19 1348 08/10/19 1730  Weight: 55.3 kg 56.2 kg 54 kg    Examination:  General exam: Appears calm and comfortable Respiratory system: Clear to auscultation. Respiratory effort normal. Cardiovascular system: S1 & S2 heard, RRR. 2/6 systolic murmur. Gastrointestinal system: Abdomen is nondistended, soft and nontender. No organomegaly or masses felt.  Normal bowel sounds heard. Central nervous system: Alert and oriented. No focal neurological deficits. Extremities: No edema. No calf tenderness Skin: No cyanosis. Multiple ecchymotic lesions Psychiatry: Judgement and insight appear normal. Mood & affect appropriate.       Data Reviewed: I have personally reviewed following labs and imaging studies  CBC: Recent Labs  Lab 08/05/19 0203 08/07/19 0607 08/07/19 1900 08/08/19 0533 08/09/19 0517 08/09/19 1231 08/09/19 2106 08/10/19 0704 08/11/19 0633  WBC 12.4* 9.2 8.0 7.8 8.9  --   --  10.5 9.0  NEUTROABS 9.2* 6.9  --   --   --   --   --   --   --   HGB 9.3* 10.1* 6.8* 9.1* 9.4* 8.7* 7.9* 8.7* 8.2*  HCT 28.2* 31.4* 21.4* 27.7* 29.1* 26.6* 24.3* 26.9* 26.0*  MCV 105.2* 105.4* 105.9* 91.4 93.9  --   --  95.1 96.3  PLT 141* 146* 150 137* 135*  --   --  131* [id number]*   Basic Metabolic Panel: Recent Labs  Lab 08/04/19 1025 08/07/19 1237 08/09/19 0517 08/10/19 1412  NA 130* 127* 133* 131*  K 3.5 4.7 4.0 4.5  CL 94* 92* 99 98  CO2 21* 24 25 23   GLUCOSE 155* 249* 167* 221*  BUN 80* 68* 51* 85*  CREATININE 5.36* 4.22* 3.76* 4.86*  CALCIUM 8.7* 8.0* 8.2* 8.2*  PHOS  --  3.6  --  3.3   GFR: Estimated Creatinine Clearance: 8.3 mL/min (A) (by C-G formula based on SCr of 4.86 mg/dL (H)). Liver Function Tests: Recent Labs  Lab 08/04/19 1025 08/07/19 1237 08/10/19 1412  AST 22  --   --   ALT 17  --   --   ALKPHOS 77  --   --   BILITOT 0.5  --   --   PROT 5.9*  --   --   ALBUMIN 2.2* 1.7* 2.1*   No results for input(s): LIPASE, AMYLASE in the last 168 hours. No results for input(s): AMMONIA in the last 168 hours. Coagulation Profile: No results for input(s): INR, PROTIME in the last 168 hours. Cardiac Enzymes: No results for input(s): CKTOTAL, CKMB, CKMBINDEX, TROPONINI in the last 168 hours. BNP (last 3 results) No results for input(s): PROBNP in the last 8760 hours. HbA1C: No results for input(s): HGBA1C in the last 72  hours. CBG: Recent Labs  Lab 08/10/19 0601 08/10/19 1232 08/10/19 1805 08/10/19 2107 08/11/19 0629  GLUCAP 155* 166* 111* 267* 131*   Lipid Profile: No results for input(s): CHOL, HDL, LDLCALC, TRIG, CHOLHDL, LDLDIRECT in the last 72 hours. Thyroid Function Tests: No results for input(s): TSH, T4TOTAL, FREET4, T3FREE, THYROIDAB in the last 72 hours. Anemia Panel: Recent Labs    08/09/19 0517  VITAMINB12 1,361*  FOLATE 23.9  FERRITIN 1,402*  TIBC 193*  IRON 56  RETICCTPCT 1.7   Sepsis Labs: Recent Labs  Lab 08/04/19 2243 08/05/19 0203  LATICACIDVEN 0.8 0.5    Recent Results (from the past 240 hour(s))  Culture, Urine     Status: Abnormal   Collection Time: 08/04/19 11:00 AM   Specimen: Urine, Random  Result Value Ref Range Status   Specimen Description URINE, RANDOM  Final  Special Requests   Final    NONE Performed at [facility], [place] [address] [place], [place] [address]    Culture (A)  Final    >=100,000 COLONIES/mL KLEBSIELLA PNEUMONIAE >=100,000 COLONIES/mL ENTEROCOCCUS FAECALIS    Report Status 08/07/2019 FINAL  Final   Organism ID, Bacteria KLEBSIELLA PNEUMONIAE (A)  Final   Organism ID, Bacteria ENTEROCOCCUS FAECALIS (A)  Final      Susceptibility   Enterococcus faecalis - MIC*    AMPICILLIN <=2 SENSITIVE Sensitive     NITROFURANTOIN <=16 SENSITIVE Sensitive     VANCOMYCIN 1 SENSITIVE Sensitive     * >=100,000 COLONIES/mL ENTEROCOCCUS FAECALIS   Klebsiella pneumoniae - MIC*    AMPICILLIN >=32 RESISTANT Resistant     CEFAZOLIN <=4 SENSITIVE Sensitive     CEFTRIAXONE <=1 SENSITIVE Sensitive     CIPROFLOXACIN <=0.25 SENSITIVE Sensitive     GENTAMICIN <=1 SENSITIVE Sensitive     IMIPENEM <=0.25 SENSITIVE Sensitive     NITROFURANTOIN 64 INTERMEDIATE Intermediate     TRIMETH/SULFA <=20 SENSITIVE Sensitive     AMPICILLIN/SULBACTAM <=2 SENSITIVE Sensitive     PIP/TAZO <=4 SENSITIVE Sensitive     * >=100,000 COLONIES/mL KLEBSIELLA PNEUMONIAE    Culture, blood (routine x 2)     Status: None   Collection Time: 08/04/19 10:43 PM   Specimen: BLOOD  Result Value Ref Range Status   Specimen Description BLOOD LEFT HAND  Final   Special Requests   Final    BOTTLES DRAWN AEROBIC AND ANAEROBIC Blood Culture results may not be optimal due to an inadequate volume of blood received in culture bottles   Culture   Final    NO GROWTH 5 DAYS Performed at [facility] [address] [place], [place] [address]    Report Status 08/09/2019 FINAL  Final  Culture, blood (routine x 2)     Status: None   Collection Time: 08/04/19 10:46 PM   Specimen: BLOOD  Result Value Ref Range Status   Specimen Description BLOOD LEFT HAND  Final   Special Requests   Final    BOTTLES DRAWN AEROBIC AND ANAEROBIC Blood Culture adequate volume   Culture   Final    NO GROWTH 5 DAYS Performed at [facility], [place] [address], [place], [place] [address]    Report Status 08/09/2019 FINAL  Final  Wet prep, genital     Status: Abnormal   Collection Time: 08/06/19  6:31 PM   Specimen: Vaginal; Genital  Result Value Ref Range Status   Yeast Wet Prep HPF POC NONE SEEN NONE SEEN Final   Trich, Wet Prep NONE SEEN NONE SEEN Final   Clue Cells Wet Prep HPF POC NONE SEEN NONE SEEN Final   WBC, Wet Prep HPF POC MANY (A) NONE SEEN Final   Sperm NONE SEEN  Final    Comment: Performed at [facility], [place] [address] [place], [place] [address]         Radiology Studies: No results found.      Scheduled Meds: . aspirin  81 mg Oral Daily  . Chlorhexidine Gluconate Cloth  6 each Topical Q0600  . [START ON 08/14/2019] darbepoetin (ARANESP) injection - DIALYSIS  150 mcg Intravenous Q Wed-HD  . feeding supplement  1 Container Oral TID BM  . feeding supplement (NEPRO CARB STEADY)  237 mL Oral TID BM  . feeding supplement (PRO-STAT SUGAR FREE 64)  30 mL Oral BID  . folic acid  1 mg Oral Daily  . Gerhardt's butt cream   Topical QID  . insulin aspart   0-9 Units Subcutaneous TID WC  . miconazole nitrate   Topical BID  . midodrine  10 mg Oral Q M,W,F-HD  . multivitamin  1 tablet Oral QHS  . pantoprazole  40 mg Intravenous Q12H  . rosuvastatin  20 mg Oral q1800  . saccharomyces boulardii  250 mg Oral BID  . sevelamer carbonate  1,600 mg Oral TID WC  . vitamin B-12  500 mcg Oral Daily   Continuous Infusions: . sodium chloride 100 mL (08/05/19 2238)  . sodium chloride       LOS: 15 days     Cordelia Poche, MD [facility] 08/11/2019, 10:07 AM  If 7PM-7AM, please contact night-coverage [email or web address]

## 2019-08-12 ENCOUNTER — Inpatient Hospital Stay (HOSPITAL_COMMUNITY): Payer: Medicare HMO

## 2019-08-12 ENCOUNTER — Inpatient Hospital Stay (HOSPITAL_COMMUNITY): Payer: Medicare HMO | Admitting: Occupational Therapy

## 2019-08-12 LAB — CBC
HCT: 27.4 % — ABNORMAL LOW (ref 36.0–46.0)
Hemoglobin: 8.5 g/dL — ABNORMAL LOW (ref 12.0–15.0)
MCH: 30.4 pg (ref 26.0–34.0)
MCHC: 31 g/dL (ref 30.0–36.0)
MCV: 97.9 fL (ref 80.0–100.0)
Platelets: 151 10*3/uL (ref 150–400)
RBC: 2.8 MIL/uL — ABNORMAL LOW (ref 3.87–5.11)
RDW: 23.4 % — ABNORMAL HIGH (ref 11.5–15.5)
WBC: 8.9 10*3/uL (ref 4.0–10.5)
nRBC: 0 % (ref 0.0–0.2)

## 2019-08-12 LAB — RENAL FUNCTION PANEL
Albumin: 2.2 g/dL — ABNORMAL LOW (ref 3.5–5.0)
Anion gap: 11 (ref 5–15)
BUN: 61 mg/dL — ABNORMAL HIGH (ref 8–23)
CO2: 25 mmol/L (ref 22–32)
Calcium: 8 mg/dL — ABNORMAL LOW (ref 8.9–10.3)
Chloride: 96 mmol/L — ABNORMAL LOW (ref 98–111)
Creatinine, Ser: 4.37 mg/dL — ABNORMAL HIGH (ref 0.44–1.00)
GFR calc Af Amer: 11 mL/min — ABNORMAL LOW (ref >60)
GFR calc non Af Amer: 9 mL/min — ABNORMAL LOW (ref >60)
Glucose, Bld: 321 mg/dL — ABNORMAL HIGH (ref 70–99)
Phosphorus: 2.8 mg/dL (ref 2.5–4.6)
Potassium: 4 mmol/L (ref 3.5–5.1)
Sodium: 132 mmol/L — ABNORMAL LOW (ref 135–145)

## 2019-08-12 LAB — GLUCOSE, CAPILLARY
Glucose-Capillary: 149 mg/dL — ABNORMAL HIGH (ref 70–99)
Glucose-Capillary: 255 mg/dL — ABNORMAL HIGH (ref 70–99)
Glucose-Capillary: 159 mg/dL — ABNORMAL HIGH (ref 70–99)

## 2019-08-12 MED ORDER — HEPARIN SODIUM (PORCINE) 1000 UNIT/ML IJ SOLN
3800.0000 [IU] | Freq: Once | INTRAMUSCULAR | Status: AC
  Administered 2019-08-12: 3800 [IU]

## 2019-08-12 MED ORDER — PANTOPRAZOLE SODIUM 40 MG PO TBEC
40.0000 mg | DELAYED_RELEASE_TABLET | Freq: Two times a day (BID) | ORAL | Status: DC
  Administered 2019-08-12 – 2019-08-24 (×24): 40 mg via ORAL
  Filled 2019-08-12 (×24): qty 1

## 2019-08-12 NOTE — Progress Notes (Signed)
PROGRESS NOTE    [name]  [id number] DOB: 03-08-1941 DOA: 07/27/2019 PCP: [name], MD   Brief Narrative: [name] is a 78 y.o. femalein Rrehab unit, with PMH of HTN, HLD, DM-2, systolic CHF, ESRD on HD MWF, CAD/CABG in 2010, AS, right thalamic CVA in 2018 and recent hospitalization for left ACA infarct. TRH consulted for concern of sepsis in setting of UTI. Patient treated with antibiotics and now is having BRBPR. GI consulted.    Assessment & Plan:   Active Problems:   Lower GI bleeding   Small vessel cerebrovascular accident (CVA) ([place])   Controlled type 2 diabetes mellitus with hyperglycemia, without long-term current use of insulin (HCC)   Labile blood glucose   Chronic cystitis   Diarrhea due to malabsorption   Lethargy   ESRD on dialysis ([place])   Sepsis secondary to UTI Not present on admission. Sepsis physiology resolved. Afebrile with no leukocytosis.  UTI Culture significant for Klebsiella pneumonia and enterococcus faecalis. Initially treated with Ceftriaxone, transitioned to Vancomycin and Zosyn and now transitioned to Augmentin. Agree with 5 day total course of treatment since blood culture no growth to date. Symptoms have resolved.  Adynamic ileus Seen on abdominal x-ray. Patient with bowel movements related to below. C. Difficile tested and was negative.   Bright red blood per rectum Acute GI bleed. Unknown if upper/lower GI bleed. Patient with a colonoscopy from 2018 significant for ulcers/erosions in the sigmoid/ascending colon in addition to sigmoid diverticulosis. Complicated by aspirin and Plavix use in the setting of recent CVA. Plavix discontinued and patient started on Protonix IV. Blood pressure is soft but stable in setting HD and chronic hypotension. Received 2 units of PRBC to date. Likely diverticular bleed. CTA abdomen/pelvis significant for rectal thickening for which GI thinks is not related to a mass. Per radiology read,  thickening likely represents source of bleeding. Recurrent hematochezia per nurse report on 12/28. Hemoglobin stable -Daily CBC -Watch BMs  Chronic hypotension Stable. -Continue midodrine  History of CVA Currently undergoing rehabilitation. Was discharged on aspirin and Plavix. Plavix held secondary to above. -Continue Crestor and aspirin 81 mg  ESRD -Per nephrology  Chronic systolic heart failure Stable. EF of 25-30%. Volume managed with HD.  Endometrial thickening Concerning for neoplasm vs hyperplasia vs polyps and will need outpatient Gyn follow-up. Discussed with patient and husband at bedside.  Hepatic cirrhosis Splenomegaly Noted on CTA. Associated perihepatic ascites. Will need PCP/GI follow-up. Watch fluid status carefully in setting of this diagnosis, heart failure and ESRD. Discussed with patient and husband.  Diabetes mellitus, type 2 -Continue SSI  Pressure injury Sacrum and right buttock, POA.   DVT prophylaxis: SCDs Code Status:   Code Status: Full Code Family Communication: Husband at bedside    Antimicrobials:  Ceftriaxone  Vancomycin  Zosyn  Augmentin    Subjective: Bloody stool this morning.  Objective: Vitals:   08/11/19 1543 08/11/19 2002 08/12/19 0513 08/12/19 0808  BP: (!) 103/57 (!) 115/52 119/81 117/67  Pulse: 81 81 87 86  Resp: 19 16 17    Temp: 98.4 F (36.9 C) 98.5 F (36.9 C) 98 F (36.7 C)   TempSrc:  Oral Oral   SpO2: 100% 99% 98% 99%  Weight:      Height:        Intake/Output Summary (Last 24 hours) at 08/12/2019 0952 Last data filed at 08/12/2019 0641 Gross per 24 hour  Intake 120 ml  Output 775 ml  Net -655 ml  Filed Weights   08/07/19 1633 08/10/19 1348 08/10/19 1730  Weight: 55.3 kg 56.2 kg 54 kg    Examination:  General exam: Appears calm and comfortable Respiratory system: Clear to auscultation. Respiratory effort normal. Cardiovascular system: S1 & S2 heard, RRR. No murmurs, rubs, gallops or  clicks. Gastrointestinal system: Abdomen is nondistended, soft and nontender. No organomegaly or masses felt. Normal bowel sounds heard. Central nervous system: Alert and oriented. Right sided hemiparesis. Extremities: No edema. No calf tenderness Skin: No cyanosis. Multiple areas of ecchymosis. Psychiatry: Judgement and insight appear normal. Mood & affect appropriate.     Data Reviewed: I have personally reviewed following labs and imaging studies  CBC: Recent Labs  Lab 08/07/19 0607 08/08/19 0533 08/09/19 0517 08/09/19 1231 08/09/19 2106 08/10/19 0704 08/11/19 0633 08/12/19 0826  WBC 9.2 7.8 8.9  --   --  10.5 9.0 8.9  NEUTROABS 6.9  --   --   --   --   --   --   --   HGB 10.1* 9.1* 9.4* 8.7* 7.9* 8.7* 8.2* 8.5*  HCT 31.4* 27.7* 29.1* 26.6* 24.3* 26.9* 26.0* 27.4*  MCV 105.4* 91.4 93.9  --   --  95.1 96.3 97.9  PLT 146* 137* 135*  --   --  131* 144* [id number]   Basic Metabolic Panel: Recent Labs  Lab 08/07/19 1237 08/09/19 0517 08/10/19 1412  NA 127* 133* 131*  K 4.7 4.0 4.5  CL 92* 99 98  CO2 24 25 23   GLUCOSE 249* 167* 221*  BUN 68* 51* 85*  CREATININE 4.22* 3.76* 4.86*  CALCIUM 8.0* 8.2* 8.2*  PHOS 3.6  --  3.3   GFR: Estimated Creatinine Clearance: 8.3 mL/min (A) (by C-G formula based on SCr of 4.86 mg/dL (H)). Liver Function Tests: Recent Labs  Lab 08/07/19 1237 08/10/19 1412  ALBUMIN 1.7* 2.1*   No results for input(s): LIPASE, AMYLASE in the last 168 hours. No results for input(s): AMMONIA in the last 168 hours. Coagulation Profile: No results for input(s): INR, PROTIME in the last 168 hours. Cardiac Enzymes: No results for input(s): CKTOTAL, CKMB, CKMBINDEX, TROPONINI in the last 168 hours. BNP (last 3 results) No results for input(s): PROBNP in the last 8760 hours. HbA1C: No results for input(s): HGBA1C in the last 72 hours. CBG: Recent Labs  Lab 08/11/19 0629 08/11/19 1247 08/11/19 1704 08/11/19 2114 08/12/19 0625  GLUCAP 131* 182* 213*  147* 149*   Lipid Profile: No results for input(s): CHOL, HDL, LDLCALC, TRIG, CHOLHDL, LDLDIRECT in the last 72 hours. Thyroid Function Tests: No results for input(s): TSH, T4TOTAL, FREET4, T3FREE, THYROIDAB in the last 72 hours. Anemia Panel: No results for input(s): VITAMINB12, FOLATE, FERRITIN, TIBC, IRON, RETICCTPCT in the last 72 hours. Sepsis Labs: No results for input(s): PROCALCITON, LATICACIDVEN in the last 168 hours.  Recent Results (from the past 240 hour(s))  Culture, Urine     Status: Abnormal   Collection Time: 08/04/19 11:00 AM   Specimen: Urine, Random  Result Value Ref Range Status   Specimen Description URINE, RANDOM  Final   Special Requests   Final    NONE Performed at [facility], [address], [place], [place] [address]    Culture (A)  Final    >=100,000 COLONIES/mL KLEBSIELLA PNEUMONIAE >=100,000 COLONIES/mL ENTEROCOCCUS FAECALIS    Report Status 08/07/2019 FINAL  Final   Organism ID, Bacteria KLEBSIELLA PNEUMONIAE (A)  Final   Organism ID, Bacteria ENTEROCOCCUS FAECALIS (A)  Final  Susceptibility   Enterococcus faecalis - MIC*    AMPICILLIN <=2 SENSITIVE Sensitive     NITROFURANTOIN <=16 SENSITIVE Sensitive     VANCOMYCIN 1 SENSITIVE Sensitive     * >=100,000 COLONIES/mL ENTEROCOCCUS FAECALIS   Klebsiella pneumoniae - MIC*    AMPICILLIN >=32 RESISTANT Resistant     CEFAZOLIN <=4 SENSITIVE Sensitive     CEFTRIAXONE <=1 SENSITIVE Sensitive     CIPROFLOXACIN <=0.25 SENSITIVE Sensitive     GENTAMICIN <=1 SENSITIVE Sensitive     IMIPENEM <=0.25 SENSITIVE Sensitive     NITROFURANTOIN 64 INTERMEDIATE Intermediate     TRIMETH/SULFA <=20 SENSITIVE Sensitive     AMPICILLIN/SULBACTAM <=2 SENSITIVE Sensitive     PIP/TAZO <=4 SENSITIVE Sensitive     * >=100,000 COLONIES/mL KLEBSIELLA PNEUMONIAE  Culture, blood (routine x 2)     Status: None   Collection Time: 08/04/19 10:43 PM   Specimen: BLOOD  Result Value Ref Range Status   Specimen  Description BLOOD LEFT HAND  Final   Special Requests   Final    BOTTLES DRAWN AEROBIC AND ANAEROBIC Blood Culture results may not be optimal due to an inadequate volume of blood received in culture bottles   Culture   Final    NO GROWTH 5 DAYS Performed at [facility] [address] [place], [place] [address]    Report Status 08/09/2019 FINAL  Final  Culture, blood (routine x 2)     Status: None   Collection Time: 08/04/19 10:46 PM   Specimen: BLOOD  Result Value Ref Range Status   Specimen Description BLOOD LEFT HAND  Final   Special Requests   Final    BOTTLES DRAWN AEROBIC AND ANAEROBIC Blood Culture adequate volume   Culture   Final    NO GROWTH 5 DAYS Performed at [facility], [place] [address] [place], [place] [address]    Report Status 08/09/2019 FINAL  Final  Wet prep, genital     Status: Abnormal   Collection Time: 08/06/19  6:31 PM   Specimen: Vaginal; Genital  Result Value Ref Range Status   Yeast Wet Prep HPF POC NONE SEEN NONE SEEN Final   Trich, Wet Prep NONE SEEN NONE SEEN Final   Clue Cells Wet Prep HPF POC NONE SEEN NONE SEEN Final   WBC, Wet Prep HPF POC MANY (A) NONE SEEN Final   Sperm NONE SEEN  Final    Comment: Performed at [facility], [place] [address] [place], [place] [address]         Radiology Studies: No results found.      Scheduled Meds: . aspirin  81 mg Oral Daily  . carbamide peroxide  5 drop Both EARS BID  . Chlorhexidine Gluconate Cloth  6 each Topical Q0600  . [START ON 08/14/2019] darbepoetin (ARANESP) injection - DIALYSIS  150 mcg Intravenous Q Wed-HD  . feeding supplement  1 Container Oral TID BM  . feeding supplement (NEPRO CARB STEADY)  237 mL Oral TID BM  . feeding supplement (PRO-STAT SUGAR FREE 64)  30 mL Oral BID  . folic acid  1 mg Oral Daily  . Gerhardt's butt cream   Topical QID  . insulin aspart  0-9 Units Subcutaneous TID WC  . miconazole nitrate   Topical BID  . midodrine  10 mg Oral Q  M,W,F-HD  . multivitamin  1 tablet Oral QHS  . pantoprazole  40 mg Intravenous Q12H  . rosuvastatin  20 mg Oral  q68  . saccharomyces boulardii  250 mg Oral BID  . sevelamer carbonate  1,600 mg Oral TID WC  . vitamin B-12  500 mcg Oral Daily   Continuous Infusions: . sodium chloride 100 mL (08/05/19 2238)  . sodium chloride       LOS: 16 days     Cordelia Poche, MD [facility] 08/12/2019, 9:52 AM  If 7PM-7AM, please contact night-coverage [email or web address]

## 2019-08-12 NOTE — Plan of Care (Signed)
  Problem: Consults Goal: RH STROKE PATIENT EDUCATION Description: See Patient Education module for education specifics  Outcome: Progressing Goal: Nutrition Consult-if indicated Outcome: Progressing Goal: Diabetes Guidelines if Diabetic/Glucose > 140 Description: If diabetic or lab glucose is > 140 mg/dl - Initiate Diabetes/Hyperglycemia Guidelines & Document Interventions  Outcome: Progressing   Problem: RH BOWEL ELIMINATION Goal: RH STG MANAGE BOWEL WITH ASSISTANCE Description: STG Manage Bowel with mod Assistance. Outcome: Progressing Goal: RH STG MANAGE BOWEL W/MEDICATION W/ASSISTANCE Description: STG Manage Bowel with Medication with mod I/supervision Assistance. Outcome: Progressing   Problem: RH BLADDER ELIMINATION Goal: RH STG MANAGE BLADDER WITH ASSISTANCE Description: STG Manage Bladder With mod Assistance Outcome: Progressing   Problem: RH SKIN INTEGRITY Goal: RH STG SKIN FREE OF INFECTION/BREAKDOWN Description: Pt has stage 2 to buttocks. Will be staged 1 or improved prior to DC with mod assist Outcome: Progressing Goal: RH STG MAINTAIN SKIN INTEGRITY WITH ASSISTANCE Description: STG Maintain Skin Integrity With mod Assistance. Outcome: Progressing Goal: RH STG ABLE TO PERFORM INCISION/WOUND CARE W/ASSISTANCE Description: STG Able To Perform Incision/Wound Care With mod Assistance. Outcome: Progressing   Problem: RH SAFETY Goal: RH STG ADHERE TO SAFETY PRECAUTIONS W/ASSISTANCE/DEVICE Description: STG Adhere to Safety Precautions With mod Assistance/Device. Outcome: Progressing Goal: RH STG DECREASED RISK OF FALL WITH ASSISTANCE Description: STG Decreased Risk of Fall With reminders/cues Assistance. Outcome: Progressing   Problem: RH COGNITION-NURSING Goal: RH STG USES MEMORY AIDS/STRATEGIES W/ASSIST TO PROBLEM SOLVE Description: STG Uses Memory Aids/Strategies With min Assistance to Problem Solve. Outcome: Progressing Goal: RH STG ANTICIPATES NEEDS/CALLS FOR  ASSIST W/ASSIST/CUES Description: STG Anticipates Needs/Calls for Assist With min Assistance/Cues. Outcome: Progressing   Problem: RH PAIN MANAGEMENT Goal: RH STG PAIN MANAGED AT OR BELOW PT'S PAIN GOAL Description: Less than 3 on 0-10 scale Outcome: Progressing   Problem: RH KNOWLEDGE DEFICIT Goal: RH STG INCREASE KNOWLEDGE OF DIABETES Description: Pt and husband will be able to verbalize 2 ways to manage DM with medication and diet regimen with min assist Outcome: Progressing Goal: RH STG INCREASE KNOWLEDGE OF HYPERTENSION Description: Pt and husband will be able to verbalize 2 ways to manage HTN with medication and diet regimen with min assist Outcome: Progressing Goal: RH STG INCREASE KNOWLEDGE OF STROKE PROPHYLAXIS Description: Pt and husband will be able to verbalize 2 ways to manage stroke prophylaxis with medication and diet regimen with min assist Outcome: Progressing

## 2019-08-12 NOTE — Progress Notes (Addendum)
Notified provider patient had large clumps of dark red, odorous blood from her rectum this morning. Patient asymptomatic and VS stable. MD to follow up.

## 2019-08-12 NOTE — Progress Notes (Signed)
Physical Therapy Weekly Progress Note  Patient Details  Name: [name] MRN: [id number] Date of Birth: [birth year]  Beginning of progress report period: August 05, 2019 End of progress report period: August 12, 2019  Today's Date: 08/12/2019 PT Individual Time: 1032-1130 PT Individual Time Calculation (min): 58 min   Patient has met 2 of 4 short term goals. Pt has made some progress with sitting balance, transfers and midline orientation this week. She is able to maintain static sitting balance with min assist and max cues for midline orientation/L hand placement to limit pushing and R lateral lean. Requires up to max assist for dynamic sitting balance. She continues to fatigue quickly and requires a tilt in space wheelchair for out of bed positioning. She is using the beasy board with therapy for transfers and is able to help by pushing through L UE/LE but still requires max assist +2. Have practiced hoyer lift transfers with the patient and family and recommending this method for home.   Patient continues to demonstrate the following deficits muscle weakness, decreased coordination and decreased motor planning, decreased midline orientation, and decreased sitting balance, decreased postural control, hemiplegia, and decreased balance strategies and therefore will continue to benefit from skilled PT intervention to increase functional independence with mobility.  Patient not progressing toward long term goals.  See goal revision..  Plan of care revisions: max assist goals for transfers with therapy, dependent standing goal, supervision for power w/c mobility goal.  PT Short Term Goals Week 2:  PT Short Term Goal 1 (Week 2): Pt will perform beasy board transfer with max assist +2 PT Short Term Goal 1 - Progress (Week 2): Met PT Short Term Goal 2 (Week 2): Pt will maintain static sitting balance with CGA PT Short Term Goal 2 - Progress (Week 2): Progressing toward goal PT Short Term Goal  3 (Week 2): Pt will use standing lift equipment for LE weightbearing x 8 minutes PT Short Term Goal 3 - Progress (Week 2): Progressing toward goal Week 3:  PT Short Term Goal 1 (Week 3): Pt will perform power w/c mobility x150 ft with supervision PT Short Term Goal 2 (Week 3): pt will use power w/c features in order to perform pressure relief with supervision  Skilled Therapeutic Interventions/Progress Updates:    Pt seated in TIS w/c upon PT arrival, family present (son) for family education this session. Son reports that they plan on hiring help at home and he would like to take videos of how the patient is moving/transferring with therapy. Discussed the following recommendations for d/c home: power w/c for increased independence with mobility, caregivers and +2 assist needed for all mobility, hoyer lift for all transfers, beasy board for practice with therapy, home health therapy vs skilled nursing facility and w/c accessible transportation for dialysis. Therapist answered all of family's questions/concerns at this time. Pt transported to the gym in [place] w/c. Pt performed beasy board transfer this session with max assist (+2 for board placement) from w/c<>mat, therapist providing education to family on techniques. Pt worked on static and dyanmic sitting balance this session, midline orientation and increased thoracic extensor activation in sitting with CGA-mod assist depending on fatigue level. Cues to limit pushing with L UE and to limit R lean/LOB. Pt transported back to room and performed beasy board transfer back to bed with max assist (second helper only for board placement). Sit>supine total assist for LE elevation and trunk control. Pt performed rolling this session with mod assist to the  R and total assist for rolling to the L, in each direction to place hoyer lift sling, education provided to family on techniques for rolling. Pt transferred dependently this session using the manual hoyer lift with  +2 assist, therapist providing verbal education this session to  family on how the lift is used, recommending for all transfers when patient discharges. Pt left in TIS w/c with family present and social worked Artist) present to answer further questions.     Therapy Documentation Precautions:  Precautions Precautions: Fall Precaution Comments: right hemiplegia Restrictions Weight Bearing Restrictions: No   Therapy/Group: Individual Therapy  Netta Corrigan, PT, DPT, CSRS 08/12/2019, 7:54 AM
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
## 2019-08-12 NOTE — Progress Notes (Signed)
Patient ID: [name], female   DOB: [birth year], 78 y.o.   MRN: [id number]  [facility] Progress Note   Assessment/ Plan:   1. 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  Ongoing inpatient OT/PT. 2. ESRD: usual HD is MWF. Had HD yesterday per holiday schedule.  Plan HD again today on schedule.  3. Anemia: With anemia of chronic kidney disease likely compounded by recent suspected diverticular bleed that has resolved.  Continue heparin free dialysis.  No additional GI bleed noted. 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 5. Nutrition: Continue renal diet with advancement per GI recommendations.  Continue renal multivitamin/ONS 6. Hypotension/ volume: gets midodrine pre HD 10mg  tiw. No BP lowering meds. Euvolemic on exam.   Kelly Splinter, MD 08/12/2019, 10:10 AM   Subjective:   No CP or sob.  No abd pain.    Objective:   BP 117/67   Pulse 86   Temp 98 F (36.7 C) (Oral)   Resp 17   Ht 5\' 8"  (1.727 m)   Wt 54 kg   SpO2 99%   BMI 18.10 kg/m   Physical Exam: Gen: Sitting up in chair, strapped in CVS: Pulse regular rhythm, normal rate, S1 and S2 normal Resp: Diminished breath sounds over bases otherwise clear to auscultation.  Right IJ TDC. Abd: Soft, flat, nontender Ext: Right leg in supportive brace, no edema  Dialysis Orders: MWF [facility] 4h  300/600   51.5kg   2/2.25 bath   TDC   P4    Heparin 1000 - Mircera 200 q 2 weeks  Labs: BMET Recent Labs  Lab 08/07/19 1237 08/09/19 0517 08/10/19 1412  NA 127* 133* 131*  K 4.7 4.0 4.5  CL 92* 99 98  CO2 24 25 23   GLUCOSE 249* 167* 221*  BUN 68* 51* 85*  CREATININE 4.22* 3.76* 4.86*  CALCIUM 8.0* 8.2* 8.2*  PHOS 3.6  --  3.3   CBC Recent Labs  Lab 08/07/19 0607 08/09/19 0517 08/09/19 2106 08/10/19 0704 08/11/19 0633 08/12/19 0826  WBC 9.2 8.9  --  10.5 9.0 8.9  NEUTROABS  6.9  --   --   --   --   --   HGB 10.1* 9.4* 7.9* 8.7* 8.2* 8.5*  HCT 31.4* 29.1* 24.3* 26.9* 26.0* 27.4*  MCV 105.4* 93.9  --  95.1 96.3 97.9  PLT 146* 135*  --  131* 144* 151   Medications:    . aspirin  81 mg Oral Daily  . carbamide peroxide  5 drop Both EARS BID  . Chlorhexidine Gluconate Cloth  6 each Topical Q0600  . [START ON 08/14/2019] darbepoetin (ARANESP) injection - DIALYSIS  150 mcg Intravenous Q Wed-HD  . feeding supplement  1 Container Oral TID BM  . feeding supplement (NEPRO CARB STEADY)  237 mL Oral TID BM  . feeding supplement (PRO-STAT SUGAR FREE 64)  30 mL Oral BID  . folic acid  1 mg Oral Daily  . Gerhardt's butt cream   Topical QID  . insulin aspart  0-9 Units Subcutaneous TID WC  . miconazole nitrate   Topical BID  . midodrine  10 mg Oral Q M,W,F-HD  . multivitamin  1 tablet Oral QHS  . pantoprazole  40 mg Intravenous Q12H  . rosuvastatin  20 mg Oral q1800  . saccharomyces boulardii  250 mg Oral BID  . sevelamer  carbonate  1,600 mg Oral TID WC  . vitamin B-12  500 mcg Oral Daily

## 2019-08-12 NOTE — Progress Notes (Signed)
Occupational Therapy Session Note  Patient Details  Name: [name] MRN: [id number] Date of Birth: 09/16/1940  Today's Date: 08/12/2019 OT Individual Time: 0800-0902 OT Individual Time Calculation (min): 62 min    Short Term Goals: Week 2:  OT Short Term Goal 1 (Week 2): Pt will be able to sit EOB with mod A for balance to prepare for her ability to sit unsupported on a toilet. OT Short Term Goal 2 (Week 2): Pt will be able to use a slide board with mod A of 1 bed to w/c to prepare for her ability to do toilet transfers. OT Short Term Goal 3 (Week 2): Pt will demonstrate improved awareness of RUE with self ROM with min A and min cues.  Skilled Therapeutic Interventions/Progress Updates:    Pt completed rolling and supine to sit from the left side of the bed with total assist from therapist.  Max assist was needed for static sitting balance overall with occasional episodes of 10-15 seconds where she could maintain her balance with min guard assist using the LUE for support.  Most of the time in sitting she pushes herself to the right resulting in LOB secondary to perceptual deficits of midline orientation.  Total assist was needed for sliding board transfer from the bed to the tilt in space wheelchair.  She then worked on grooming tasks of brushing her teeth, washing her face, and combing her hair from supported sitting.  Mod instructional cueing to sequence setup of toothbrush as well as for putting items back up when finished.  Max hand over hand assist needed to hold the toothpaste with the RUE while she removed the top.  She was able to brush with setup and rinse with use of the cup, using the LUE only.  Increased processing time needed with mod instructional cueing needed to coordinated putting toothbrush down and then picking up cup and placing cap back on the toothpaste.  Finished session with pt tilted slightly back in the wheelchair with safety belt in place and full lap tray supporting  the RUE as well as her breakfast.  Setup assist for breakfast as well.    Therapy Documentation Precautions:  Precautions Precautions: Fall Precaution Comments: right hemiplegia Restrictions Weight Bearing Restrictions: No  Pain: Pain Assessment Pain Scale: 0-10 Pain Score: 0-No pain ADL: See Care Tool Section for some details of mobility and selfcare  Therapy/Group: Individual Therapy  Tc Kapusta OTR/L \\12 /28/2020, 10:55 AM

## 2019-08-12 NOTE — Plan of Care (Signed)
  Problem: RH Balance Goal: LTG Patient will maintain dynamic standing balance (PT) Description: LTG:  Patient will maintain dynamic standing balance with assistance during mobility activities (PT) Outcome: Not Applicable Flowsheets (Taken 08/12/2019 1316) LTG: Pt will maintain dynamic standing balance during mobility activities with:: (dc'd goal, pt dependent for standing) --   Problem: RH Car Transfers Goal: LTG Patient will perform car transfers with assist (PT) Description: LTG: Patient will perform car transfers with assistance (PT). Outcome: Not Applicable Flowsheets (Taken 08/12/2019 1316) LTG: Pt will perform car transfers with assist:: (goal dc'd, pt will need w/c accesible transportation) --   Problem: RH Ambulation Goal: LTG Patient will ambulate in controlled environment (PT) Description: LTG: Patient will ambulate in a controlled environment, # of feet with assistance (PT). Outcome: Not Applicable Flowsheets (Taken 08/12/2019 1316) LTG: Pt will ambulate in controlled environ  assist needed:: (goal dc'd, pt non-ambulatory) --   Problem: RH Balance Goal: LTG Patient will maintain dynamic sitting balance (PT) Description: LTG:  Patient will maintain dynamic sitting balance with assistance during mobility activities (PT) Flowsheets (Taken 08/12/2019 1316) LTG: Pt will maintain dynamic sitting balance during mobility activities with:: (downgraded 12/28 secondary to lack of progress) Minimal Assistance - Patient > 75%   Problem: Sit to Stand Goal: LTG:  Patient will perform sit to stand with assistance level (PT) Description: LTG:  Patient will perform sit to stand with assistance level (PT) Flowsheets (Taken 08/12/2019 1316) LTG: PT will perform sit to stand in preparation for functional mobility with assistance level: (downgraded 12/28 secondary to lack of progress) Dependent - mechanical lift   Problem: RH Bed Mobility Goal: LTG Patient will perform bed mobility with  assist (PT) Description: LTG: Patient will perform bed mobility with assistance, with/without cues (PT). Flowsheets (Taken 08/12/2019 1316) LTG: Pt will perform bed mobility with assistance level of: (downgraded 12/28 secondary to lack of progress) Maximal Assistance - Patient 25 - 49%   Problem: RH Bed to Chair Transfers Goal: LTG Patient will perform bed/chair transfers w/assist (PT) Description: LTG: Patient will perform bed to chair transfers with assistance (PT). Flowsheets (Taken 08/12/2019 1316) LTG: Pt will perform Bed to Chair Transfers with assistance level: (downgraded 12/28 secondary to lack of progress) Maximal Assistance - Patient 25 - 49%

## 2019-08-12 NOTE — Progress Notes (Signed)
Social Work Patient ID: [name], female   DOB: 10-08-1940, 78 y.o.   MRN: [id number]  Met with pt, husband for a short time, son, sister in-law and friend to answer their questions. They plan to hire assist for pt aware it will be 24 hr care. Want to know if can pursue short term NHP while all of this is being set up. Discussed with her insurance it is not likely she will get coverage but can try. Pt has missed several therapy sessions due to medical issues and team may want to extend til the end of next week. Family will continue to come in for training. Husband does not want pt to have to use [facility] for HD and wants to get a [facility] to transport her to HD. Main issue is to hire caregivers equipment and home health can be arranged in a brief time it will take longer to hire caregivers. Son to start today. Pt is in agreement with all of this. Son to be back tomorrow for power [facility] evaluation.

## 2019-08-12 NOTE — Progress Notes (Signed)
Occupational Therapy Session Note  Patient Details  Name: [name] MRN: [id number] Date of Birth: 1940-09-23  Today's Date: 08/10/2019 OT Individual Time: 1030-1100 OT Individual Time Calculation (min): 30 min   Short Term Goals: Week 2:  OT Short Term Goal 1 (Week 2): Pt will be able to sit EOB with mod A for balance to prepare for her ability to sit unsupported on a toilet. OT Short Term Goal 2 (Week 2): Pt will be able to use a slide board with mod A of 1 bed to w/c to prepare for her ability to do toilet transfers. OT Short Term Goal 3 (Week 2): Pt will demonstrate improved awareness of RUE with self ROM with min A and min cues.  Skilled Therapeutic Interventions/Progress Updates:    Pt greeted semi-reclined in bed with spouse present and agreeable to OT treatment session. Worked on bed mobility and sitting balance with pt needing max A to come to sitting EOB. Pt with retropulsion and pushing self over to the R. Had pt place her hand in her lap to decrease pushing, but still needed max/total A for sitting balance with lateral LOB to the R hemiplegic side. Pt wanted to don new shirt. Nursing entered to assist with threading new Iv. Pt needed max A to don shirt with OT also providing max/total A for sitting balance. Pt reported max fatigue and was returned to bed with total A. Pt wanted to don pants so OT assist with total A to thread pant legs, then worked on hip bridging with pt able to almost clear bottom form bed with asisstance for R LE positioning. Pt left semi-reclined in bed with spouse present and needs met.   Therapy Documentation Precautions:  Precautions Precautions: Fall Precaution Comments: right hemiplegia Restrictions Weight Bearing Restrictions: No Pain: Denies pain  Therapy/Group: Individual Therapy  Valma Cava 08/10/2019, 12:19 PM

## 2019-08-12 NOTE — Progress Notes (Signed)
Nutrition Follow-up  DOCUMENTATION CODES:   Underweight, suspect pt with some degree of malnutrition but unable to confirm at this time without NFPE  INTERVENTION:   - Recommend liberalizing diet to Regular  - Continue rena-vit daily  - Pro-stat 30 ml po BID, each supplement provides 100 kcal and 15 grams of protein  - Nepro Shake po TID, each supplement provides 425 kcal and 19 grams protein  - d/c Boost Breeze due to fluid restriction  NUTRITION DIAGNOSIS:   Increased nutrient needs related to chronic illness (ESRD on HD, CHF) as evidenced by estimated needs.  Ongoing, being addressed via supplements  GOAL:   Patient will meet greater than or equal to 90% of their needs  Progressing  MONITOR:   PO intake, Supplement acceptance, Labs, Weight trends, Skin, I & O's  REASON FOR ASSESSMENT:   Other (Comment)(low BMI)    ASSESSMENT:   78 year old female with PMH of HTN, HLD, DM, history of GI bleed, IBS, CHF, ESRD on HD, CAD s/p CABG 2010, aortic stenosis, history of right thalamic CVA 2018, tobacco abuse. Presented 07/23/19 with right-sided weakness. Chronic small vessel ischemia and generalized volume loss noted. CT angiogram negative for large vessel occlusion however positive for significant intracranial and extracranial atherosclerotic disease. MRI of the brain revealing areas of acute infarction throughout the distal left anterior cerebral artery territory. Pt admitted to CIR on 12/12.  12/18 - s/p TEE 12/20 - KUB suggestive of ileus, clear liquid diet 12/22 - full liquid diet 12/23 - regular textures  Noted plan for d/c on 08/20/19.  Noted pt had suspected diverticular bleed which is now resolved. However, pt with dark red blood from rectum this AM.  Pt in HD at this time. RD unable to speak with pt today.  Last HD on 12/26 with 2000 ml net UF. Post-HD weight was 54 kg.  Current weight is 3 lbs above CIR admit weight. Per RN edema assessment, pt with mild  pitting edema to BUE.  Recommend liberalizing diet to Regular to promote PO intake.  Meal Completion: 10-85% x last 8 meals  Medications reviewed and include: Boost Breeze TID, Nepro TID, Pro-stat 30 ml BID, folic acid, SSI, rena-vit, protonix, Florastor, Renvela, vitamin B-12  Labs reviewed: sodium 132, chloride 96, hemoglobin 8.5 CBG's: 147-255 x 24 hours  UOP: 775 ml x 24 hours  NUTRITION - FOCUSED PHYSICAL EXAM:  Unable to complete at this time. Pt in HD.  Diet Order:   Diet Order            Diet renal with fluid restriction Fluid restriction: 1200 mL Fluid; Room service appropriate? Yes; Fluid consistency: Thin  Diet effective now              EDUCATION NEEDS:   No education needs have been identified at this time  Skin:  Skin Assessment: Skin Integrity Issues:: Stage II Stage II: right buttocks  Last BM:  08/12/19  Height:   Ht Readings from Last 1 Encounters:  07/27/19 5\' 8"  (1.727 m)    Weight:   Wt Readings from Last 1 Encounters:  08/10/19 54 kg    Ideal Body Weight:  63.6 kg  BMI:  Body mass index is 18.1 kg/m.  Estimated Nutritional Needs:   Kcal:  1450-1650  Protein:  70-85 grams  Fluid:  UOP + 1000 ml    Gaynell Face, MS, RD, LDN Inpatient Clinical Dietitian Pager: [phone] Weekend/After Hours: [phone]

## 2019-08-12 NOTE — Progress Notes (Signed)
Social Work Patient ID: [name], female   DOB: 1941/06/15, 78 y.o.   MRN: [id number]  Family here for education husband thought today but actually planned for tomorrow, non-dialysis day. Will stay for PT session and then come back. Will meet with this worker after PT session to address questions and concerns.

## 2019-08-13 ENCOUNTER — Inpatient Hospital Stay (HOSPITAL_COMMUNITY): Payer: Medicare HMO | Admitting: Physical Therapy

## 2019-08-13 ENCOUNTER — Inpatient Hospital Stay (HOSPITAL_COMMUNITY): Payer: Medicare HMO

## 2019-08-13 ENCOUNTER — Encounter (HOSPITAL_COMMUNITY): Payer: Medicare HMO | Admitting: Occupational Therapy

## 2019-08-13 LAB — CBC
HCT: 23.3 % — ABNORMAL LOW (ref 36.0–46.0)
Hemoglobin: 7.2 g/dL — ABNORMAL LOW (ref 12.0–15.0)
MCH: 30.4 pg (ref 26.0–34.0)
MCHC: 30.9 g/dL (ref 30.0–36.0)
MCV: 98.3 fL (ref 80.0–100.0)
Platelets: 133 10*3/uL — ABNORMAL LOW (ref 150–400)
RBC: 2.37 MIL/uL — ABNORMAL LOW (ref 3.87–5.11)
RDW: 23.6 % — ABNORMAL HIGH (ref 11.5–15.5)
WBC: 6.6 10*3/uL (ref 4.0–10.5)
nRBC: 0 % (ref 0.0–0.2)

## 2019-08-13 LAB — BASIC METABOLIC PANEL
Anion gap: 11 (ref 5–15)
BUN: 31 mg/dL — ABNORMAL HIGH (ref 8–23)
CO2: 26 mmol/L (ref 22–32)
Calcium: 8.4 mg/dL — ABNORMAL LOW (ref 8.9–10.3)
Chloride: 99 mmol/L (ref 98–111)
Creatinine, Ser: 2.49 mg/dL — ABNORMAL HIGH (ref 0.44–1.00)
GFR calc Af Amer: 21 mL/min — ABNORMAL LOW (ref >60)
GFR calc non Af Amer: 18 mL/min — ABNORMAL LOW (ref >60)
Glucose, Bld: 120 mg/dL — ABNORMAL HIGH (ref 70–99)
Potassium: 3.7 mmol/L (ref 3.5–5.1)
Sodium: 136 mmol/L (ref 135–145)

## 2019-08-13 LAB — GLUCOSE, CAPILLARY
Glucose-Capillary: 91 mg/dL (ref 70–99)
Glucose-Capillary: 113 mg/dL — ABNORMAL HIGH (ref 70–99)
Glucose-Capillary: 118 mg/dL — ABNORMAL HIGH (ref 70–99)
Glucose-Capillary: 306 mg/dL — ABNORMAL HIGH (ref 70–99)
Glucose-Capillary: 128 mg/dL — ABNORMAL HIGH (ref 70–99)

## 2019-08-13 LAB — HEMOGLOBIN AND HEMATOCRIT, BLOOD
HCT: 29.6 % — ABNORMAL LOW (ref 36.0–46.0)
Hemoglobin: 8.9 g/dL — ABNORMAL LOW (ref 12.0–15.0)

## 2019-08-13 LAB — OCCULT BLOOD X 1 CARD TO LAB, STOOL: Fecal Occult Bld: POSITIVE — AB

## 2019-08-13 MED ORDER — CHLORHEXIDINE GLUCONATE CLOTH 2 % EX PADS: 6.0000 | MEDICATED_PAD | Freq: Every day | CUTANEOUS | Status: DC

## 2019-08-13 NOTE — Progress Notes (Signed)
[facility] PHYSICAL MEDICINE & REHABILITATION PROGRESS NOTE   Subjective/Complaints: No complaints this morning. Hgb 7.2; appreciate Dr. Lonny Prude following, will repeat H&H this afternoon  ROS: Patient denies fever, rash, sore throat, blurred vision, nausea, vomiting, diarrhea, cough, shortness of breath or chest pain, joint or back pain, headache, or mood change.    Objective:   No results found. Recent Labs    08/12/19 0826 08/13/19 0524  WBC 8.9 6.6  HGB 8.5* 7.2*  HCT 27.4* 23.3*  PLT 151 133*   Recent Labs    08/12/19 1400 08/13/19 0524  NA 132* 136  K 4.0 3.7  CL 96* 99  CO2 25 26  GLUCOSE 321* 120*  BUN 61* 31*  CREATININE 4.37* 2.49*  CALCIUM 8.0* 8.4*    Intake/Output Summary (Last 24 hours) at 08/13/2019 1200 Last data filed at 08/13/2019 0900 Gross per 24 hour  Intake 360 ml  Output 1250 ml  Net -890 ml     Physical Exam: Vital Signs Blood pressure (!) 103/42, pulse 71, temperature 98.2 F (36.8 C), temperature source Oral, resp. rate 18, height 5\' 8"  (1.727 m), weight 55 kg, SpO2 100 %. Constitutional: No distress . Vital signs reviewed. HEENT: EOMI, oral membranes moist Neck: supple Cardiovascular: RRR with murmur. No JVD    Respiratory: CTA Bilaterally without wheezes or rales. Normal effort    GI: BS +, non-tender, non-distended  Rectal- not visualized (in w/c) Skin: Warm and dry.  Intact. Psych: Normal mood.  Normal behavior. Musc: No edema in extremities.  No tenderness in extremities. Neurological: Alert Motor: RUE/RLE: 0/5 proximal distal,trace finger flexion  LUE: 4 -/5 proximal to distal LLE: 3/5 proximal to distal  Assessment/Plan: 1. 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  Physiatrist is providing close team supervision and 24 hour management of active medical problems listed below.  Physiatrist and rehab team  continue to assess barriers to discharge/monitor patient progress toward functional and medical goals  Care Tool:  Bathing    Body parts bathed by patient: Chest, Abdomen   Body parts bathed by helper: Right arm, Left arm Body parts n/a: Front perineal area, Buttocks, Right upper leg, Left lower leg, Right lower leg, Left upper leg(did not attempt)   Bathing assist Assist Level: Total Assistance - Patient < 25%(to maintain sitting balance at EOB)     Upper Body Dressing/Undressing Upper body dressing   What is the patient wearing?: Pull over shirt    Upper body assist Assist Level: Maximal Assistance - Patient 25 - 49%    Lower Body Dressing/Undressing Lower body dressing      What is the patient wearing?: Incontinence brief     Lower body assist Assist for lower body dressing: Dependent - Patient 0%(supine rolling)     Toileting Toileting    Toileting assist Assist for toileting: Dependent - Patient 0%     Transfers Chair/bed transfer  Transfers assist     Chair/bed transfer assist level: 2 Helpers     Locomotion Ambulation   Ambulation assist   Ambulation activity did not occur: Safety/medical concerns(poor postural control and decreased standing ability)          Walk 10 feet activity   Assist  Walk 10 feet activity did not occur: Safety/medical concerns        Walk 50 feet activity   Assist Walk 50 feet with 2 turns activity did not occur:  Safety/medical concerns         Walk 150 feet activity   Assist Walk 150 feet activity did not occur: Safety/medical concerns         Walk 10 feet on uneven surface  activity   Assist Walk 10 feet on uneven surfaces activity did not occur: Safety/medical concerns         Wheelchair     Assist Will patient use wheelchair at discharge?: Yes Type of Wheelchair: Manual    Wheelchair assist level: Dependent - Patient 0% Max wheelchair distance: 150 ft    Wheelchair 50 feet  with 2 turns activity    Assist        Assist Level: Dependent - Patient 0%   Wheelchair 150 feet activity     Assist      Assist Level: Dependent - Patient 0%   Blood pressure (!) 103/42, pulse 71, temperature 98.2 F (36.8 C), temperature source Oral, resp. rate 18, height 5\' 8"  (1.727 m), weight 55 kg, SpO2 100 %.  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   -consider palliative care consult given multiple co-morbidities/complications below 2. Antithrombotics: -DVT/anticoagulation:SCDs -antiplatelet therapy: Aspirin 81 mg daily and Plavix 75 mg daily x3 weeks then aspirin alone 3. Pain Management:Tylenol as needed 4. Mood:Xanax 1 mg nightly held for lethargy which has improved  - resumed at bedtime prn at lower dose  -antipsychotic agents: N/A 5. Neuropsych: This patientiscapable of making decisions on herown behalf. 6. Skin/Wound Care:Routine skin checks -local skin care for abrasions -nutrition discussed  -benadryl prn for itching  -sarna lotion prn 7. Fluids/Electrolytes/Nutrition:Routine and outs 8.  ESRD. Hemodialysis per renal services. HD after therapy day to allow for better therapy participation 9. Orthostasis. Continue ProAmatine 10 mg Monday Wednesday Friday   Vitals:   08/12/19 2028 08/13/19 0551  BP: (!) 101/52 (!) 103/42  Pulse: 78 71  Resp: 16 18  Temp: 98.4 F (36.9 C) 98.2 F (36.8 C)  SpO2: 96% 100%  controlled 12/28 10. Hyperlipidemia. Crestor 11. Acute on chronic combined congestive heart failure. Monitor for any signs of fluid overload. Daily weights  TEE suggesting EF of 35% with mild MR, TR, AS Filed Weights   08/10/19 1730 08/12/19 1339 08/12/19 1719  Weight: 54 kg 56 kg 55 kg    Weights stable 12. Diabetes mellitus type II with  hyperglycemia, hemoglobin A1c 5.3.   Continue SSI  Extremely labile on 12/20  Appreciate nephrology follow-up on 12/26 13. History of tobacco abuse. Counseling 14. CAD with CABG 2010. Continue aspirin. No chest pain reported. 15. IBS/history of GI bleed. hold Amitiza24 mcg daily - due to diarrhea  16.  Social son threatened to break through security to visit his mom  76.  Chronic cystitis  Seen by urology, on no abx at present 18.  Diarrhea- C diff neg,was on  Amitiza took this every other day at home to avoid diarrhea   -being held for now  improved 19.  Vaginal d/c: appreciate GYN eval- endometrial mass will need f/u  20. Fever x 1 with leukocytosis, off vanc and Zosyn,and  augmentin --afebrile   Xanax DC'd- appears brighter but sleep is altered will restart low dose prn at night   Fluid status per Nephro given HD   Patient oliguric-ESRD    Chest x-ray ordered- no evidence of infiltrate 21.  ABLA GI bleed hx of same (diverticular  per GI), Hgb responded to 2 U PRBC , appreciate GI consult --outpt f/u with Dr. Collene Mares   - serial Hgb--up to 8.5 12/28 despite blood in stool  -with recurrent GI bleeding/hematochezia, appreciate hospitalist Dr. Lisbeth Ply follow-up. On 12/29 Hgb was 7.2; will repeat this afternoon.  22. Ear wax: debrox added 12/27     LOS: 17 days A FACE TO FACE EVALUATION WAS PERFORMED  Gryphon Vanderveen P Dashonna Chagnon 08/13/2019, 12:00 PM

## 2019-08-13 NOTE — Progress Notes (Signed)
Social Work Patient ID: [name], female   DOB: 01/23/1941, 77 y.o.   MRN: [id number]    Diagnosis code: I63.9, I50.32, N18.6  Height: 5'2  Weight: 119 lbs   Patient has CVA and ESRD  which requiresher  to be positioned in ways not feasible with a normal bed.  Head must be elevated at least 30 degrees .  Her upper and lower extremities requires frequent and immediate changes in body position which cannot be achieved with a normal bed.

## 2019-08-13 NOTE — Progress Notes (Signed)
Speech Language Pathology Weekly Progress and Session Note  Patient Details  Name: [name] MRN: [id number] Date of Birth: February 24, 1941  Beginning of progress report period: August 06, 2019 End of progress report period: August 13, 2019  Today's Date: 08/13/2019 SLP Individual Time: 1305-1330 SLP Individual Time Calculation (min): 25 min  Short Term Goals: Week 2: SLP Short Term Goal 1 (Week 2): Pt will use external aids to recall daily information with Supervision assist verbal cues. SLP Short Term Goal 1 - Progress (Week 2): Not met SLP Short Term Goal 2 (Week 2): Pt will complete mildly complex tasks with min assist verbal cues for functional problem solving. SLP Short Term Goal 2 - Progress (Week 2): Met SLP Short Term Goal 3 (Week 2): Pt will locate items to right of midline with Supervision A verbal/visual cues. SLP Short Term Goal 3 - Progress (Week 2): Not met SLP Short Term Goal 4 (Week 2): Pt will selectively attend to tasks with Min A verbal cues for redirection. SLP Short Term Goal 4 - Progress (Week 2): Met    New Short Term Goals: Week 3: SLP Short Term Goal 1 (Week 3): Pt will use external aids to recall daily information with Supervision assist verbal cues. SLP Short Term Goal 2 (Week 3): Pt will complete mildly complex tasks with supervision assist verbal cues for functional problem solving. SLP Short Term Goal 3 (Week 3): Pt will locate items to right of midline with Supervision A verbal/visual cues. SLP Short Term Goal 4 (Week 3): Pt will selectively attend to tasks with supervision A verbal cues for redirection.  Weekly Progress Updates: Pt met 2 out 4 goals this reporting period due to flucating ability impacted by fatigue and pain, although more consistent gains than last reporting period. Pt demonstrated increase ability to scan right of midline, semi-complex problem solving (including beginning medication management), recall of daily events and selective  attention. SLP downgraded problem solving from complex to semi-complex problem solving due to slow,but steady progress. Pt would benefit from skilled ST services in order to maximize functional independence and reduce burden of care, likely requiring 24 hour supervision and continue ST services.     Intensity: Minumum of 1-2 x/day, 30 to 90 minutes Frequency: 3 to 5 out of 7 days Duration/Length of Stay: 08/20/19 or possible short extention per PT Treatment/Interventions: Cognitive remediation/compensation;Cueing hierarchy;Functional tasks;Environmental controls;Internal/external aids;Patient/family education   Daily Session  Skilled Therapeutic Interventions:  Skilled ST services focused on cognitive skills. SLP facilitated semi-complex problem solving and emergent awareness skills in familiar BID pill organizer task, pt required min A verbal cues. Pt expressed distraction from buttock pain, SLP repositioned pt in chair. Pt was left in room with call bell within reach and chair alarm set. ST recommends to continue skilled ST services.      General    Pain Pain Assessment Faces Pain Scale: Hurts a little bit Pain Type: Acute pain Pain Location: Buttocks Pain Descriptors / Indicators: Aching Pain Onset: Gradual Pain Intervention(s): Repositioned  Therapy/Group: Individual Therapy  Noble Bodie  [facility] 08/13/2019, 4:30 PM

## 2019-08-13 NOTE — Progress Notes (Signed)
Occupational Therapy Session Note  Patient Details  Name: [name] MRN: [id number] Date of Birth: 1941/01/15  Today's Date: 08/13/2019 OT Individual Time: 1000-1100 OT Individual Time Calculation (min): 60 min    Short Term Goals: Week 1:  OT Short Term Goal 1 (Week 1): Pt will maintain sitting balance with min assist to prepare for ADL tasks OT Short Term Goal 1 - Progress (Week 1): Not met OT Short Term Goal 2 (Week 1): Pt will complete UB bathing with min assist OT Short Term Goal 2 - Progress (Week 1): Progressing toward goal OT Short Term Goal 3 (Week 1): Pt will complete LB bathing with max assist of 1 caregiver OT Short Term Goal 3 - Progress (Week 1): Not met OT Short Term Goal 4 (Week 1): Pt will complete toilet transfer with max assist of one caregiver OT Short Term Goal 4 - Progress (Week 1): Not met Week 2:  OT Short Term Goal 1 (Week 2): Pt will be able to sit EOB with mod A for balance to prepare for her ability to sit unsupported on a toilet. OT Short Term Goal 2 (Week 2): Pt will be able to use a slide board with mod A of 1 bed to w/c to prepare for her ability to do toilet transfers. OT Short Term Goal 3 (Week 2): Pt will demonstrate improved awareness of RUE with self ROM with min A and min cues.  Skilled Therapeutic Interventions/Progress Updates:    1:1 FAmily education with pt and pt's husband (son not present). Husband reports "they will always have a caregiver" and is not able to physically provide care. Pt participated in LB bathing and dressing at bed level. Engaged husband with positioning of LE with assistance for rolling and threading pants. Pt continues to wear incontinent briefs a nd new one was donned. Pt able to assist with cleaning peri area with A for the positioning, thighs, and lower legs with A to cross legs. Pt positioned to assist with bridging but not able to get enough clearance to pull up pants; finished with rolling with mod A. Continued  education on hoyer lift training. Printed out directions and add instructions for loops, how two caregivers would be used/ recommended and how to assist with pt's positioning and the setup of the w/c. Husband watched but didn't not participate.   Pt completed face washing, brushing hair and teeth at the sink with w/c setup at sink.   Pt handed off to the next therapist.   Therapy Documentation Precautions:  Precautions Precautions: Fall Precaution Comments: right hemiplegia Restrictions Weight Bearing Restrictions: No General:   Vital Signs: Therapy Vitals Temp: 98.2 F (36.8 C) Temp Source: Oral Pulse Rate: 76 Resp: 18 BP: (!) 86/43 Patient Position (if appropriate): Lying Oxygen Therapy SpO2: 100 % O2 Device: Room Air Pain: No pain indicated   Therapy/Group: Individual Therapy  [name] [facility] 08/13/2019, 6:32 PM

## 2019-08-13 NOTE — Plan of Care (Signed)
  Problem: Consults Goal: RH STROKE PATIENT EDUCATION Description: See Patient Education module for education specifics  Outcome: Progressing Goal: Nutrition Consult-if indicated Outcome: Progressing Goal: Diabetes Guidelines if Diabetic/Glucose > 140 Description: If diabetic or lab glucose is > 140 mg/dl - Initiate Diabetes/Hyperglycemia Guidelines & Document Interventions  Outcome: Progressing   Problem: RH BOWEL ELIMINATION Goal: RH STG MANAGE BOWEL WITH ASSISTANCE Description: STG Manage Bowel with mod Assistance. Outcome: Progressing Goal: RH STG MANAGE BOWEL W/MEDICATION W/ASSISTANCE Description: STG Manage Bowel with Medication with mod I/supervision Assistance. Outcome: Progressing   Problem: RH BLADDER ELIMINATION Goal: RH STG MANAGE BLADDER WITH ASSISTANCE Description: STG Manage Bladder With mod Assistance Outcome: Progressing   Problem: RH SKIN INTEGRITY Goal: RH STG SKIN FREE OF INFECTION/BREAKDOWN Description: Pt has stage 2 to buttocks. Will be staged 1 or improved prior to DC with mod assist Outcome: Progressing Goal: RH STG MAINTAIN SKIN INTEGRITY WITH ASSISTANCE Description: STG Maintain Skin Integrity With mod Assistance. Outcome: Progressing Goal: RH STG ABLE TO PERFORM INCISION/WOUND CARE W/ASSISTANCE Description: STG Able To Perform Incision/Wound Care With mod Assistance. Outcome: Progressing   Problem: RH SAFETY Goal: RH STG ADHERE TO SAFETY PRECAUTIONS W/ASSISTANCE/DEVICE Description: STG Adhere to Safety Precautions With mod Assistance/Device. Outcome: Progressing Goal: RH STG DECREASED RISK OF FALL WITH ASSISTANCE Description: STG Decreased Risk of Fall With reminders/cues Assistance. Outcome: Progressing   Problem: RH COGNITION-NURSING Goal: RH STG USES MEMORY AIDS/STRATEGIES W/ASSIST TO PROBLEM SOLVE Description: STG Uses Memory Aids/Strategies With min Assistance to Problem Solve. Outcome: Progressing Goal: RH STG ANTICIPATES NEEDS/CALLS FOR  ASSIST W/ASSIST/CUES Description: STG Anticipates Needs/Calls for Assist With min Assistance/Cues. Outcome: Progressing   Problem: RH PAIN MANAGEMENT Goal: RH STG PAIN MANAGED AT OR BELOW PT'S PAIN GOAL Description: Less than 3 on 0-10 scale Outcome: Progressing   Problem: RH KNOWLEDGE DEFICIT Goal: RH STG INCREASE KNOWLEDGE OF DIABETES Description: Pt and husband will be able to verbalize 2 ways to manage DM with medication and diet regimen with min assist Outcome: Progressing Goal: RH STG INCREASE KNOWLEDGE OF HYPERTENSION Description: Pt and husband will be able to verbalize 2 ways to manage HTN with medication and diet regimen with min assist Outcome: Progressing Goal: RH STG INCREASE KNOWLEDGE OF STROKE PROPHYLAXIS Description: Pt and husband will be able to verbalize 2 ways to manage stroke prophylaxis with medication and diet regimen with min assist Outcome: Progressing

## 2019-08-13 NOTE — Progress Notes (Addendum)
Social Work Patient ID: [name], female   DOB: 1941/05/20, 78 y.o.   MRN: [id number]  Met with pt,son and friend who is here for power chair evaluation. Answered questions regarding coverage, private duty and equipment. Discussed [facility] for transportation to HD and son reports they do not want to do this but plan on purchasing a Lucianne Lei for pt. Contacted [facility] to ask questions regarding hoyer transfers and if is incontinent while here. She reports they do not have the staff change her but family member or their caregiver would need to do this. Have conveyed this information to son, pt and friend. Pt is doing much better and reports feeling better-mentally and physically. Son will be coming back on Thursday for more training. Will get equipment list from therapy team. [facility] they are not in network with pt's insurance-made son aware of this along with husband.

## 2019-08-13 NOTE — Progress Notes (Addendum)
PROGRESS NOTE    [name]  [id number] DOB: Sep 19, 1940 DOA: 07/27/2019 PCP: [name], MD   Brief Narrative: [name] is a 78 y.o. femalein Rrehab unit, with PMH of HTN, HLD, DM-2, systolic CHF, ESRD on HD MWF, CAD/CABG in 2010, AS, right thalamic CVA in 2018 and recent hospitalization for left ACA infarct. TRH consulted for concern of sepsis in setting of UTI. Patient treated with antibiotics and now is having BRBPR. GI consulted. CTA significant for diverticulosis and rectal thickening. Aspirin/plavix initially held. Aspirin restarted.   Assessment & Plan:   Active Problems:   Lower GI bleeding   Small vessel cerebrovascular accident (CVA) ([place])   Controlled type 2 diabetes mellitus with hyperglycemia, without long-term current use of insulin ([facility])   Labile blood glucose   Chronic cystitis   Diarrhea due to malabsorption   Lethargy   ESRD on dialysis ([place])   Sepsis secondary to UTI Not present on admission. Sepsis physiology resolved. Afebrile with no leukocytosis.  UTI Culture significant for Klebsiella pneumonia and enterococcus faecalis. Initially treated with Ceftriaxone, transitioned to Vancomycin and Zosyn and now transitioned to Augmentin. Agree with 5 day total course of treatment since blood culture no growth to date. Symptoms have resolved.  Adynamic ileus Seen on abdominal x-ray. Patient with bowel movements related to below. C. Difficile tested and was negative. Resolved.  Bright red blood per rectum Acute GI bleed. Unknown if upper/lower GI bleed. Patient with a colonoscopy from 2018 significant for ulcers/erosions in the sigmoid/ascending colon in addition to sigmoid diverticulosis. Complicated by aspirin and Plavix use in the setting of recent CVA. Plavix discontinued and patient started on Protonix IV. Blood pressure is soft but stable in setting HD and chronic hypotension. Received 2 units of PRBC to date. Likely diverticular bleed. CTA  abdomen/pelvis significant for rectal thickening for which GI thinks is not related to a mass. Per radiology read, thickening likely represents source of bleeding. Recurrent maroon stool. Hemoglobin trended down 12/29. -Repeat H&H this afternoon; Daily CBC -Watch BMs  Chronic hypotension Stable. -Continue midodrine  History of CVA Currently undergoing rehabilitation. Was discharged on aspirin and Plavix. Plavix held secondary to above. -Continue Crestor and aspirin 81 mg  ESRD -Per nephrology  Chronic systolic heart failure Stable. EF of 25-30%. Volume managed with HD.  Endometrial thickening Concerning for neoplasm vs hyperplasia vs polyps and will need outpatient Gyn follow-up. Discussed with patient and husband at bedside.  Hepatic cirrhosis Splenomegaly Noted on CTA. Associated perihepatic ascites. Will need PCP/GI follow-up. Watch fluid status carefully in setting of this diagnosis, heart failure and ESRD. Discussed with patient and husband.  Diabetes mellitus, type 2 -Continue SSI  Pressure injury Sacrum and right buttock, POA.   DVT prophylaxis: SCDs Code Status:   Code Status: Full Code Family Communication: None    Antimicrobials:  Ceftriaxone  Vancomycin  Zosyn  Augmentin    Subjective: Another episode of bloody stool.  Objective: Vitals:   08/12/19 1700 08/12/19 1719 08/12/19 2028 08/13/19 0551  BP: (!) 91/45 (!) 98/48 (!) 101/52 (!) 103/42  Pulse: 81 80 78 71  Resp:  18 16 18   Temp:  98.2 F (36.8 C) 98.4 F (36.9 C) 98.2 F (36.8 C)  TempSrc:  Axillary Oral Oral  SpO2:  100% 96% 100%  Weight:  55 kg    Height:        Intake/Output Summary (Last 24 hours) at 08/13/2019 1154 Last data filed at 08/13/2019 0900 Gross per 24  hour  Intake 360 ml  Output 1250 ml  Net -890 ml   Filed Weights   08/10/19 1730 08/12/19 1339 08/12/19 1719  Weight: 54 kg 56 kg 55 kg    Examination:  General exam: Appears calm and  comfortable Respiratory system: Clear to auscultation. Respiratory effort normal. Cardiovascular system: S1 & S2 heard, RRR. 2/6 systolic murmur. Gastrointestinal system: Abdomen is nondistended, soft and nontender. No organomegaly or masses felt. Normal bowel sounds heard. Central nervous system: Alert and oriented. No focal neurological deficits. Extremities: No edema. No calf tenderness Skin: No cyanosis. Multiple areas of ecchymosis Psychiatry: Judgement and insight appear normal. Mood & affect appropriate.     Data Reviewed: I have personally reviewed following labs and imaging studies  CBC: Recent Labs  Lab 08/07/19 0607 08/09/19 0517 08/09/19 2106 08/10/19 0704 08/11/19 0633 08/12/19 0826 08/13/19 0524  WBC 9.2 8.9  --  10.5 9.0 8.9 6.6  NEUTROABS 6.9  --   --   --   --   --   --   HGB 10.1* 9.4* 7.9* 8.7* 8.2* 8.5* 7.2*  HCT 31.4* 29.1* 24.3* 26.9* 26.0* 27.4* 23.3*  MCV 105.4* 93.9  --  95.1 96.3 97.9 98.3  PLT 146* 135*  --  131* 144* 151 [id number]*   Basic Metabolic Panel: Recent Labs  Lab 08/07/19 1237 08/09/19 0517 08/10/19 1412 08/12/19 1400 08/13/19 0524  NA 127* 133* 131* 132* 136  K 4.7 4.0 4.5 4.0 3.7  CL 92* 99 98 96* 99  CO2 24 25 23 25 26   GLUCOSE 249* 167* 221* 321* 120*  BUN 68* 51* 85* 61* 31*  CREATININE 4.22* 3.76* 4.86* 4.37* 2.49*  CALCIUM 8.0* 8.2* 8.2* 8.0* 8.4*  PHOS 3.6  --  3.3 2.8  --    GFR: Estimated Creatinine Clearance: 16.4 mL/min (A) (by C-G formula based on SCr of 2.49 mg/dL (H)). Liver Function Tests: Recent Labs  Lab 08/07/19 1237 08/10/19 1412 08/12/19 1400  ALBUMIN 1.7* 2.1* 2.2*   No results for input(s): LIPASE, AMYLASE in the last 168 hours. No results for input(s): AMMONIA in the last 168 hours. Coagulation Profile: No results for input(s): INR, PROTIME in the last 168 hours. Cardiac Enzymes: No results for input(s): CKTOTAL, CKMB, CKMBINDEX, TROPONINI in the last 168 hours. BNP (last 3 results) No results for  input(s): PROBNP in the last 8760 hours. HbA1C: No results for input(s): HGBA1C in the last 72 hours. CBG: Recent Labs  Lab 08/11/19 2114 08/12/19 0625 08/12/19 1146 08/12/19 2119 08/13/19 0619  GLUCAP 147* 149* 255* 159* 113*   Lipid Profile: No results for input(s): CHOL, HDL, LDLCALC, TRIG, CHOLHDL, LDLDIRECT in the last 72 hours. Thyroid Function Tests: No results for input(s): TSH, T4TOTAL, FREET4, T3FREE, THYROIDAB in the last 72 hours. Anemia Panel: No results for input(s): VITAMINB12, FOLATE, FERRITIN, TIBC, IRON, RETICCTPCT in the last 72 hours. Sepsis Labs: No results for input(s): PROCALCITON, LATICACIDVEN in the last 168 hours.  Recent Results (from the past 240 hour(s))  Culture, Urine     Status: Abnormal   Collection Time: 08/04/19 11:00 AM   Specimen: Urine, Random  Result Value Ref Range Status   Specimen Description URINE, RANDOM  Final   Special Requests   Final    NONE Performed at [facility], [address], [place], [place] [address]    Culture (A)  Final    >=100,000 COLONIES/mL KLEBSIELLA PNEUMONIAE >=100,000 COLONIES/mL ENTEROCOCCUS FAECALIS    Report Status 08/07/2019 FINAL  Final   Organism ID, Bacteria KLEBSIELLA PNEUMONIAE (A)  Final   Organism ID, Bacteria ENTEROCOCCUS FAECALIS (A)  Final      Susceptibility   Enterococcus faecalis - MIC*    AMPICILLIN <=2 SENSITIVE Sensitive     NITROFURANTOIN <=16 SENSITIVE Sensitive     VANCOMYCIN 1 SENSITIVE Sensitive     * >=100,000 COLONIES/mL ENTEROCOCCUS FAECALIS   Klebsiella pneumoniae - MIC*    AMPICILLIN >=32 RESISTANT Resistant     CEFAZOLIN <=4 SENSITIVE Sensitive     CEFTRIAXONE <=1 SENSITIVE Sensitive     CIPROFLOXACIN <=0.25 SENSITIVE Sensitive     GENTAMICIN <=1 SENSITIVE Sensitive     IMIPENEM <=0.25 SENSITIVE Sensitive     NITROFURANTOIN 64 INTERMEDIATE Intermediate     TRIMETH/SULFA <=20 SENSITIVE Sensitive     AMPICILLIN/SULBACTAM <=2 SENSITIVE Sensitive     PIP/TAZO <=4  SENSITIVE Sensitive     * >=100,000 COLONIES/mL KLEBSIELLA PNEUMONIAE  Culture, blood (routine x 2)     Status: None   Collection Time: 08/04/19 10:43 PM   Specimen: BLOOD  Result Value Ref Range Status   Specimen Description BLOOD LEFT HAND  Final   Special Requests   Final    BOTTLES DRAWN AEROBIC AND ANAEROBIC Blood Culture results may not be optimal due to an inadequate volume of blood received in culture bottles   Culture   Final    NO GROWTH 5 DAYS Performed at [facility] [address] [place], [place] [address]    Report Status 08/09/2019 FINAL  Final  Culture, blood (routine x 2)     Status: None   Collection Time: 08/04/19 10:46 PM   Specimen: BLOOD  Result Value Ref Range Status   Specimen Description BLOOD LEFT HAND  Final   Special Requests   Final    BOTTLES DRAWN AEROBIC AND ANAEROBIC Blood Culture adequate volume   Culture   Final    NO GROWTH 5 DAYS Performed at [facility], [place] [address], [place], [place] [address]    Report Status 08/09/2019 FINAL  Final  Wet prep, genital     Status: Abnormal   Collection Time: 08/06/19  6:31 PM   Specimen: Vaginal; Genital  Result Value Ref Range Status   Yeast Wet Prep HPF POC NONE SEEN NONE SEEN Final   Trich, Wet Prep NONE SEEN NONE SEEN Final   Clue Cells Wet Prep HPF POC NONE SEEN NONE SEEN Final   WBC, Wet Prep HPF POC MANY (A) NONE SEEN Final   Sperm NONE SEEN  Final    Comment: Performed at [facility], [place] [address] [place], [place] [address]         Radiology Studies: No results found.      Scheduled Meds: . aspirin  81 mg Oral Daily  . carbamide peroxide  5 drop Both EARS BID  . Chlorhexidine Gluconate Cloth  6 each Topical Q0600  . [START ON 08/14/2019] darbepoetin (ARANESP) injection - DIALYSIS  150 mcg Intravenous Q Wed-HD  . feeding supplement (NEPRO CARB STEADY)  237 mL Oral TID BM  . feeding supplement (PRO-STAT SUGAR FREE 64)  30 mL Oral BID  . folic acid  1 mg  Oral Daily  . Gerhardt's butt cream   Topical QID  . insulin aspart  0-9 Units Subcutaneous TID WC  . miconazole nitrate   Topical BID  . midodrine  10 mg Oral Q M,W,F-HD  . multivitamin  1 tablet Oral  QHS  . pantoprazole  40 mg Oral BID  . rosuvastatin  20 mg Oral q1800  . saccharomyces boulardii  250 mg Oral BID  . sevelamer carbonate  1,600 mg Oral TID WC  . vitamin B-12  500 mcg Oral Daily   Continuous Infusions: . sodium chloride 100 mL (08/05/19 2238)  . sodium chloride       LOS: 17 days     Cordelia Poche, MD [facility] 08/13/2019, 11:54 AM  If 7PM-7AM, please contact night-coverage [email or web address]

## 2019-08-13 NOTE — Progress Notes (Signed)
Patient ID: [name], female   DOB: Dec 20, 1940, 78 y.o.   MRN: [id number]  [facility] Progress Note   Assessment/ Plan:   1. 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  Ongoing inpatient OT/PT. 2. ESRD: usual HD is MWF. HD tomorrow on schedule.  3. Anemia: With anemia of chronic kidney disease likely compounded by recent suspected diverticular bleed that has resolved. No additional GI bleed noted. Holding hep w/ HD for now.  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 5. Nutrition: Continue renal diet with advancement per GI recommendations.  Continue renal multivitamin/ONS 6. Hypotension/ volume: gets midodrine pre HD 10mg  tiw. No BP lowering meds. Euvolemic on exam.   Kelly Splinter, MD 08/13/2019, 12:48 PM   Subjective:   No CP or sob.  No abd pain.    Objective:   BP (!) 103/42 (BP Location: Right Arm)   Pulse 71   Temp 98.2 F (36.8 C) (Oral)   Resp 18   Ht 5\' 8"  (1.727 m)   Wt 55 kg   SpO2 100%   BMI 18.44 kg/m   Physical Exam: Gen: lying in bed CVS: Pulse regular rhythm, normal rate, S1 and S2 normal Resp: Diminished breath sounds over bases otherwise clear to auscultation.  Right IJ TDC. Abd: Soft, flat, nontender Ext: Right leg in supportive brace, no edema  Dialysis Orders: MWF [facility] 4h  300/600   51.5kg   2/2.25 bath   TDC   P4    Heparin 1000 - Mircera 200 q 2 weeks  Labs: BMET Recent Labs  Lab 08/07/19 1237 08/09/19 0517 08/10/19 1412 08/12/19 1400 08/13/19 0524  NA 127* 133* 131* 132* 136  K 4.7 4.0 4.5 4.0 3.7  CL 92* 99 98 96* 99  CO2 24 25 23 25 26   GLUCOSE 249* 167* 221* 321* 120*  BUN 68* 51* 85* 61* 31*  CREATININE 4.22* 3.76* 4.86* 4.37* 2.49*  CALCIUM 8.0* 8.2* 8.2* 8.0* 8.4*  PHOS 3.6  --  3.3 2.8  --    CBC Recent Labs  Lab 08/07/19 0607 08/10/19 0704 08/11/19 0633 08/12/19 0826  08/13/19 0524  WBC 9.2 10.5 9.0 8.9 6.6  NEUTROABS 6.9  --   --   --   --   HGB 10.1* 8.7* 8.2* 8.5* 7.2*  HCT 31.4* 26.9* 26.0* 27.4* 23.3*  MCV 105.4* 95.1 96.3 97.9 98.3  PLT 146* 131* 144* 151 133*   Medications:    . aspirin  81 mg Oral Daily  . carbamide peroxide  5 drop Both EARS BID  . Chlorhexidine Gluconate Cloth  6 each Topical Q0600  . [START ON 08/14/2019] darbepoetin (ARANESP) injection - DIALYSIS  150 mcg Intravenous Q Wed-HD  . feeding supplement (NEPRO CARB STEADY)  237 mL Oral TID BM  . feeding supplement (PRO-STAT SUGAR FREE 64)  30 mL Oral BID  . folic acid  1 mg Oral Daily  . Gerhardt's butt cream   Topical QID  . insulin aspart  0-9 Units Subcutaneous TID WC  . miconazole nitrate   Topical BID  . midodrine  10 mg Oral Q M,W,F-HD  . multivitamin  1 tablet Oral QHS  . pantoprazole  40 mg Oral BID  . rosuvastatin  20 mg Oral q1800  . saccharomyces boulardii  250 mg Oral BID  . sevelamer carbonate  1,600 mg Oral TID WC  .  vitamin B-12  500 mcg Oral Daily

## 2019-08-13 NOTE — Plan of Care (Signed)
  Problem: RH Problem Solving Goal: LTG Patient will demonstrate problem solving for (SLP) Description: LTG:  Patient will demonstrate problem solving for basic/complex daily situations with cues  (SLP) Flowsheets (Taken 08/13/2019 1634) LTG: Patient will demonstrate problem solving for (SLP): (semi-complex) Other (comment) LTG Patient will demonstrate problem solving for: (downgraded due to slow progress) -- Note: Downgraded due to slow progress

## 2019-08-13 NOTE — Progress Notes (Signed)
Physical Therapy Session Note  Patient Details  Name: [name] MRN: [id number] Date of Birth: 1940/11/06  Today's Date: 08/13/2019 PT Individual Time: 1100-1220 PT Individual Time Calculation (min): 80 min   Short Term Goals: Week 3:  PT Short Term Goal 1 (Week 3): Pt will perform power w/c mobility x150 ft with supervision PT Short Term Goal 2 (Week 3): pt will use power w/c features in order to perform pressure relief with supervision  Skilled Therapeutic Interventions/Progress Updates:    Pt received sitting in TIS as hand-off from OT and pt's husband present with her son [name]) and family friend arriving immediately after the PT for hands-on family training. Therapist discussed with pt/family regarding plan for power w/c evaluation today as well as discharge plan including DME recommendations of hospital bed and hoyer lift. Pt's family aware and in agreement with the plan to only perform +2 assist hoyer transfers w/c<>hospital bed at home. Pt's son reports they are planning to arrange hired 24hr caregiver support. Transported to/from gym in [place] w/c dependently. Therapist educated pt's son on proper pressure relief schedule in w/c and bed as well as proper positioning and support for pt's R hemibody. Pt's son participated in hands-on training for hoyer lift transfer from [place] w/c>EOM with max cuing for sequencing and proper set-up with the therapist providing +2 assist and educating pt's son on what assist is being provided and how to train other people to be the +2 assist. Josh, ATP present for power w/c evaluation. Therapist, pt, and family discussed pt's power w/c needs including: power recline, power tilt, power seat elevation, power leg rests, seat back with lateral support, roho cushion for skin integrity, and R UE custom molded arm trough. Supine>sit total assist for B LE management and trunk upright with cuing for increased pt participation. Pt participated in seated EOM trunk  control/midline orientation/sitting balance with pt initially requiring max assist for trunk control due to R LOB and slight pushing tendencies but after prolonged seated in L lateral trunk flexion on L UE forearm support pt able to return to midline and maintain trunk control with CGA/min assist and L HHA. Sit>supine with +2 total assist for trunk descent and B LE management. Therapist performed hoyer transfer EOM>TIS w/c with reinforcement of education to pt's son regarding need for +2 and rolls of both individuals as well as sequencing of the transfer with proper set-up. Tranpsorted back to room in w/c and pt left in TIS w/c with nursing staff present to assist with meal. Pt's family planning to return for family education on Thursday with loaner power w/c as one was not available for use today during family education/training.   Therapy Documentation Precautions:  Precautions Precautions: Fall Precaution Comments: right hemiplegia Restrictions Weight Bearing Restrictions: No  Pain:   Denies pain during session.   Therapy/Group: Individual Therapy  Tawana Scale, PT, DPT 08/13/2019, 12:51 PM

## 2019-08-13 NOTE — Plan of Care (Signed)
  Problem: RH Balance Goal: LTG: Patient will maintain dynamic sitting balance (OT) Description: LTG:  Patient will maintain dynamic sitting balance with assistance during activities of daily living (OT) Flowsheets (Taken 08/13/2019 1844) LTG: Pt will maintain dynamic sitting balance during ADLs with: (downgraded JLS) Moderate Assistance - Patient 50 - 74% Note: Downgraded due to progress Goal: LTG Patient will maintain dynamic standing with ADLs (OT) Description: LTG:  Patient will maintain dynamic standing balance with assist during activities of daily living (OT)  Flowsheets (Taken 08/13/2019 1844) LTG: Pt will maintain dynamic standing balance during ADLs with: (d/c goal) -- Note: D/c goal at this time   Problem: RH Balance Goal: LTG Patient will maintain dynamic standing with ADLs (OT) Description: LTG:  Patient will maintain dynamic standing balance with assist during activities of daily living (OT)  Flowsheets (Taken 08/13/2019 1844) LTG: Pt will maintain dynamic standing balance during ADLs with: (d/c goal) -- Note: D/c goal at this time   Problem: Sit to Stand Goal: LTG:  Patient will perform sit to stand in prep for activites of daily living with assistance level (OT) Description: LTG:  Patient will perform sit to stand in prep for activites of daily living with assistance level (OT) Flowsheets (Taken 08/13/2019 1844) LTG: PT will perform sit to stand in prep for activites of daily living with assistance level: (d/c goal JLS) -- Note: D/c goal at this time    Problem: RH Bathing Goal: LTG Patient will bathe all body parts with assist levels (OT) Description: LTG: Patient will bathe all body parts with assist levels (OT) Flowsheets (Taken 08/13/2019 1844) LTG: Pt will perform bathing with assistance level/cueing: (downgraded JLS) Moderate Assistance - Patient 50 - 74% Note: Downgraded due to progress   Problem: RH Dressing Goal: LTG Patient will perform upper body dressing  (OT) Description: LTG Patient will perform upper body dressing with assist, with/without cues (OT). Flowsheets (Taken 08/13/2019 1844) LTG: Pt will perform upper body dressing with assistance level of: (Downgraded due to progress) Moderate Assistance - Patient 50 - 74% Note: Downgraded due to progress Goal: LTG Patient will perform lower body dressing w/assist (OT) Description: LTG: Patient will perform lower body dressing with assist, with/without cues in positioning using equipment (OT) Flowsheets (Taken 08/13/2019 1844) LTG: Pt will perform lower body dressing with assistance level of: (Downgraded due to progress) Maximal Assistance - Patient 25 - 49% Note: Downgraded due to progress   Problem: RH Toileting Goal: LTG Patient will perform toileting task (3/3 steps) with assistance level (OT) Description: LTG: Patient will perform toileting task (3/3 steps) with assistance level (OT)  Flowsheets (Taken 08/13/2019 1844) LTG: Pt will perform toileting task (3/3 steps) with assistance level: (Downgraded due to progress) Maximal Assistance - Patient 25 - 49% Note: Downgraded due to progress   Problem: RH Toilet Transfers Goal: LTG Patient will perform toilet transfers w/assist (OT) Description: LTG: Patient will perform toilet transfers with assist, with/without cues using equipment (OT) Flowsheets (Taken 08/13/2019 1844) LTG: Pt will perform toilet transfers with assistance level of: (Downgraded due to progress) Maximal Assistance - Patient 25 - 49% Note: Downgraded due to progress

## 2019-08-14 ENCOUNTER — Inpatient Hospital Stay (HOSPITAL_COMMUNITY): Payer: Medicare HMO | Admitting: Physical Therapy

## 2019-08-14 ENCOUNTER — Inpatient Hospital Stay (HOSPITAL_COMMUNITY): Payer: Medicare HMO

## 2019-08-14 ENCOUNTER — Inpatient Hospital Stay (HOSPITAL_COMMUNITY): Payer: Medicare HMO | Admitting: Occupational Therapy

## 2019-08-14 LAB — GLUCOSE, CAPILLARY
Glucose-Capillary: 122 mg/dL — ABNORMAL HIGH (ref 70–99)
Glucose-Capillary: 143 mg/dL — ABNORMAL HIGH (ref 70–99)
Glucose-Capillary: 209 mg/dL — ABNORMAL HIGH (ref 70–99)
Glucose-Capillary: 217 mg/dL — ABNORMAL HIGH (ref 70–99)

## 2019-08-14 LAB — CBC
HCT: 23.9 % — ABNORMAL LOW (ref 36.0–46.0)
Hemoglobin: 7.4 g/dL — ABNORMAL LOW (ref 12.0–15.0)
MCH: 31 pg (ref 26.0–34.0)
MCHC: 31 g/dL (ref 30.0–36.0)
MCV: 100 fL (ref 80.0–100.0)
Platelets: 124 10*3/uL — ABNORMAL LOW (ref 150–400)
RBC: 2.39 MIL/uL — ABNORMAL LOW (ref 3.87–5.11)
RDW: 23.9 % — ABNORMAL HIGH (ref 11.5–15.5)
WBC: 7.4 10*3/uL (ref 4.0–10.5)
nRBC: 0 % (ref 0.0–0.2)

## 2019-08-14 NOTE — Patient Care Conference (Signed)
Inpatient RehabilitationTeam Conference and Plan of Care Update Date: 08/14/2019   Time: 10:40 AM   Patient Name: [name]      Medical Record Number: [id number]  Date of Birth: 11/08/1940 Sex: Female         Room/Bed: 4W25C/4W25C-01 Payor Info: Payor: [employer] MEDICARE / Plan: [place] MEDICARE HMO/PPO / Product Type: *No Product type* /    Admit Date/Time:  07/27/2019  6:07 PM  Primary Diagnosis:  Small vessel cerebrovascular accident (CVA) [facility])  Patient Active Problem List   Diagnosis Date Noted  . Lethargy   . ESRD on dialysis ([place])   . Controlled type 2 diabetes mellitus with hyperglycemia, without long-term current use of insulin ([place])   . Labile blood glucose   . Chronic cystitis   . Diarrhea due to malabsorption   . Small vessel cerebrovascular accident (CVA) ([place]) 07/27/2019  . Stage I pressure ulcer of sacral region 07/25/2019  . TIA (transient ischemic attack) 07/23/2019  . CVA (cerebral vascular accident) ([place]) 07/23/2019  . Volume overload 05/27/2018  . Dyspnea 05/27/2018  . Acute respiratory failure with hypoxia ([place]) 05/27/2018  . Acute respiratory distress 05/27/2018  . Acute encephalopathy 05/27/2018  . Aspiration pneumonia ([place]) 05/27/2018  . Pressure ulcer 05/19/2018  . Acute on chronic combined systolic and diastolic heart failure ([place]) 05/17/2018  . LBBB (left bundle branch block) 05/17/2018  . Aortic stenosis, moderate 05/17/2018  . NSTEMI (non-ST elevated myocardial infarction) ([place]) 05/16/2018  . Symptomatic anemia 05/15/2018  . Demand ischemia ([place]) 05/15/2018  . Protein-calorie malnutrition, severe 04/20/2018  . Pressure injury of skin 04/18/2018  . Lower GI bleeding 10/31/2016  . Blood loss anemia 10/31/2016  . AKI (acute kidney injury) ([place]) 10/31/2016  . Stroke ([place]) 09/26/2016  . ESRD (end stage renal disease) on dialysis ([place]) 06/23/2015  . Balance problem 12/22/2013  . Edema 12/28/2011  . CLAUDICATION 11/01/2010  . CAROTID ARTERY  STENOSIS 10/29/2009  . AORTIC STENOSIS 09/17/2009  . Essential hypertension 11/17/2008  . DIASTOLIC HEART FAILURE, CHRONIC 11/17/2008  . Diabetes mellitus type II, non insulin dependent ([place]) 11/12/2008  . Other and unspecified hyperlipidemia 11/12/2008  . Anemia, chronic renal failure 11/12/2008  . Thrombocytopenia ([place]) 11/12/2008  . Anxiety state 11/12/2008  . Coronary atherosclerosis 11/12/2008  . UNSPEC COMBINED SYSTOLIC&DIASTOLIC HEART FAILURE 0000000  . ESOPHAGEAL REFLUX 11/12/2008  . Chronic kidney disease (CKD), stage V ([place]) 11/12/2008  . LOW BACK PAIN, CHRONIC 11/12/2008  . INSOMNIA UNSPECIFIED 11/12/2008    Expected Discharge Date: Expected Discharge Date: 08/24/19  Team Members Present: Physician leading conference: Dr. Leeroy Cha Social Worker Present: Ovidio Kin, LCSW Nurse Present: Genene Churn, RN Case Manager: Karene Fry, RN PT Present: Donnamae Jude, PT OT Present: [name], OT SLP Present: Charolett Bumpers, SLP PPS Coordinator present : Gunnar Fusi, SLP     Current Status/Progress Goal Weekly Team Focus  Bowel/Bladder   Pt is incontinet of stool; having multiple type 7 & 6 stools dark red in color. Pt has foley cath in place for acute urinary retention. LBM 08/13/19  pt will regain continence  Q2h toileting/PRN, BID foley care   Swallow/Nutrition/ Hydration             ADL's   working on [employer] lift education with family due to significant A needed for transfers, bed level ADLs (for LB and UB in the chair) max A with A for positioning  goals downgraded  family education, self care training, hoyer lift traninig, NMR   Mobility  mod assist rolling to R, total assist rolling L and total assist supine<>sit. Static sitting balance min assist, dynamic sitting balance max assist, max +2 beasy board or dependent transfer with lift  max assist transfers and bed mobility, supervision power w/c propulsion  power w/c propulsion training, family  educaiton, sitting balance, pressure relief with power chair tilt, d/c planning   Communication             Safety/Cognition/ Behavioral Observations  Min A  supervision A  semi-complex problem solving, recall and selective attention   Pain   pt c/o of buttocks pain. pt  pts pain will be less than 3.  Assess pain qshift. Q2h turning while in bed.   Skin   pt has stage 2 on buttocks, MASD to groin and buttocks, Pt has skin tear to LLE and Lhand. Bruising on bilater UE/LE  prevent further breakdown and free on infection.  Assess skin Qshift/ PRN    Rehab Goals Patient on target to meet rehab goals: No Rehab Goals Revised: Downgraded goals *See Care Plan and progress notes for long and short-term goals.     Barriers to Discharge  Current Status/Progress Possible Resolutions Date Resolved   Nursing                  PT                    OT                  SLP                SW                Discharge Planning/Teaching Needs:  Husband and son have been in to begin family training and son looking at hiring assist, will probably not be ready for 1/5. Pt doing better in therapies and able to participate more fully due to medical issues more stable      Team Discussion: Diverticular bleeding better controlled, monitoring labs, on HD.  RN inc BM, no blood today in stool, stage 2 on bottom, groin breakdown, stomach pain, had tylenol.  OT dried blood rectum yesterday, husband fam ed yesterday, training on hoyer lift, son to come in tomorrow, slide board mod/max, bathing/dressing mod/max A, pushing.  PT son here yesterday, planning to hire care givers, power chair eval yesterday.  SLP goals S, is min A problem solving and recall.   Revisions to Treatment Plan: N/A     Medical Summary Current Status: Hgb 7.4 this morning, had stool this morning without bleeding. Husband is unable to provide physical care, son is supposed to receive training as well. Weekly Focus/Goal: Daily CBC,  monitoring of stool, son needs more caregiver training.  Barriers to Discharge: Behavior  Barriers to Discharge Comments: fear of falling is impacting mobility-still mod to max for bathing/dressing, still with low Hgb Possible Resolutions to Barriers: Daily CBC, monitoring of stool, son needs more caregiver training (with [facility] lift), continued therapies   Continued Need for Acute Rehabilitation Level of Care: The patient requires daily medical management by a physician with specialized training in physical medicine and rehabilitation for the following reasons: Direction of a multidisciplinary physical rehabilitation program to maximize functional independence : Yes Medical management of patient stability for increased activity during participation in an intensive rehabilitation regime.: Yes Analysis of laboratory values and/or radiology reports with any subsequent need for medication adjustment and/or medical intervention. : Yes  I attest that I was present, lead the team conference, and concur with the assessment and plan of the team.   Retta Diones 08/14/2019, 8:52 PM  Team conference was held via web/ teleconference due to [place] - 19

## 2019-08-14 NOTE — Progress Notes (Signed)
Speech Language Pathology Daily Session Note  Patient Details  Name: [name] MRN: [id number] Date of Birth: [birth year]  Today's Date: 08/14/2019 SLP Individual Time: [id number] SLP Individual Time Calculation (min): 27 min  Short Term Goals: Week 3: SLP Short Term Goal 1 (Week 3): Pt will use external aids to recall daily information with Supervision assist verbal cues. SLP Short Term Goal 2 (Week 3): Pt will complete mildly complex tasks with supervision assist verbal cues for functional problem solving. SLP Short Term Goal 3 (Week 3): Pt will locate items to right of midline with Supervision A verbal/visual cues. SLP Short Term Goal 4 (Week 3): Pt will selectively attend to tasks with supervision A verbal cues for redirection.  Skilled Therapeutic Interventions: Skilled ST services focused on cognitive skills. SLP facilitated recall of familiar card task, pt recalled 3 out 3 rules with supervision A verbal cues. SLP also facilitated semi-complex problem solving and recall skills in familiar card task played at higher level, pt required supervision A verbal cues to recall rules, min A verbal cues to apply rules and mod A verbal cues for semi-complex problem solving fading to min A verbal cues when broken down step by step. Pt required supervision A verbal cues for scanning right of midline to locate cards. Pt was left in room with call bell within reach and bed alarm set. ST recommends to continue skilled ST services.      Pain Pain Assessment Pain Scale: 0-10 Pain Score: 0-No pain  Therapy/Group: Individual Therapy  [name]  [facility] 08/14/2019, 8:33 AM

## 2019-08-14 NOTE — Progress Notes (Signed)
Informed floor Rn Genene Churn HD tx rescheduled to 08/15/19 per Dr. Jonnie Finner

## 2019-08-14 NOTE — Progress Notes (Signed)
[facility] PHYSICAL MEDICINE & REHABILITATION PROGRESS NOTE   Subjective/Complaints: No complaints this morning, sleepy Hgb 7.4 this morning.  Appreciate hospitalist and nephrology following.   ROS: Patient denies fever, rash, sore throat, blurred vision, nausea, vomiting, diarrhea, cough, shortness of breath or chest pain, joint or back pain, headache, or mood change.    Objective:   No results found. Recent Labs    08/13/19 0524 08/13/19 1259 08/14/19 0510  WBC 6.6  --  7.4  HGB 7.2* 8.9* 7.4*  HCT 23.3* 29.6* 23.9*  PLT 133*  --  124*   Recent Labs    08/12/19 1400 08/13/19 0524  NA 132* 136  K 4.0 3.7  CL 96* 99  CO2 25 26  GLUCOSE 321* 120*  BUN 61* 31*  CREATININE 4.37* 2.49*  CALCIUM 8.0* 8.4*    Intake/Output Summary (Last 24 hours) at 08/14/2019 0914 Last data filed at 08/13/2019 1900 Gross per 24 hour  Intake 342 ml  Output 300 ml  Net 42 ml     Physical Exam: Vital Signs Blood pressure (!) 113/59, pulse 79, temperature 98.9 F (37.2 C), temperature source Oral, resp. rate 18, height 5\' 8"  (1.727 m), weight 55 kg, SpO2 98 %. Constitutional: No distress . Vital signs reviewed. HEENT: EOMI, oral membranes moist Neck: supple Cardiovascular: RRR with murmur. No JVD    Respiratory: CTA Bilaterally without wheezes or rales. Normal effort    GI: BS +, non-tender, non-distended  Rectal- not visualized (in w/c) Skin: Warm and dry.  Intact. Psych: Normal mood.  Normal behavior. Musc: No edema in extremities.  No tenderness in extremities. Neurological: Alert Motor: RUE/RLE: 0/5 proximal distal,trace finger flexion  LUE: 4 -/5 proximal to distal LLE: 4-/5 proximal to distal  Assessment/Plan: 1. 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  Physiatrist is providing close team supervision and 24 hour management of active medical problems listed  below.  Physiatrist and rehab team continue to assess barriers to discharge/monitor patient progress toward functional and medical goals  Care Tool:  Bathing    Body parts bathed by patient: Chest, Abdomen, Front perineal area, Right upper leg, Left upper leg, Face   Body parts bathed by helper: Buttocks, Right lower leg, Left lower leg, Right arm, Left arm Body parts n/a: Front perineal area, Buttocks, Right upper leg, Left lower leg, Right lower leg, Left upper leg(did not attempt)   Bathing assist Assist Level: Moderate Assistance - Patient 50 - 74%     Upper Body Dressing/Undressing Upper body dressing   What is the patient wearing?: Pull over shirt    Upper body assist Assist Level: Moderate Assistance - Patient 50 - 74%    Lower Body Dressing/Undressing Lower body dressing      What is the patient wearing?: Incontinence brief, Pants     Lower body assist Assist for lower body dressing: Maximal Assistance - Patient 25 - 49%     Toileting Toileting    Toileting assist Assist for toileting: Dependent - Patient 0%     Transfers Chair/bed transfer  Transfers assist     Chair/bed transfer assist level: Dependent - mechanical lift     Locomotion Ambulation   Ambulation assist   Ambulation activity did not occur: Safety/medical concerns(poor postural control and decreased standing ability)          Walk 10 feet activity   Assist  Walk 10 feet activity  did not occur: Safety/medical concerns        Walk 50 feet activity   Assist Walk 50 feet with 2 turns activity did not occur: Safety/medical concerns         Walk 150 feet activity   Assist Walk 150 feet activity did not occur: Safety/medical concerns         Walk 10 feet on uneven surface  activity   Assist Walk 10 feet on uneven surfaces activity did not occur: Safety/medical concerns         Wheelchair     Assist Will patient use wheelchair at discharge?: Yes Type of  Wheelchair: Manual    Wheelchair assist level: Dependent - Patient 0% Max wheelchair distance: 150 ft    Wheelchair 50 feet with 2 turns activity    Assist        Assist Level: Dependent - Patient 0%   Wheelchair 150 feet activity     Assist      Assist Level: Dependent - Patient 0%   Blood pressure (!) 113/59, pulse 79, temperature 98.9 F (37.2 C), temperature source Oral, resp. rate 18, height 5\' 8"  (1.727 m), weight 55 kg, SpO2 98 %.  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   -consider palliative care consult given multiple co-morbidities/complications below 2. Antithrombotics: -DVT/anticoagulation:SCDs -antiplatelet therapy: Aspirin 81 mg daily and Plavix 75 mg daily x3 weeks then aspirin alone 3. Pain Management:Tylenol as needed 4. Mood:Xanax 1 mg nightly held for lethargy which has improved  - resumed at bedtime prn at lower dose  -antipsychotic agents: N/A 5. Neuropsych: This patientiscapable of making decisions on herown behalf. 6. Skin/Wound Care:Routine skin checks -local skin care for abrasions -nutrition discussed  -benadryl prn for itching  -sarna lotion prn 7. Fluids/Electrolytes/Nutrition:Routine and outs 8.  ESRD. Hemodialysis per renal services. HD after therapy day to allow for better therapy participation 9. Orthostasis. Continue ProAmatine 10 mg Monday Wednesday Friday   Vitals:   08/13/19 2020 08/14/19 0500  BP: (!) 113/59   Pulse: 81 79  Resp: 18 18  Temp: 98.2 F (36.8 C) 98.9 F (37.2 C)  SpO2: 98% 98%  controlled 12/28 10. Hyperlipidemia. Crestor 11. Acute on chronic combined congestive heart failure. Monitor for any signs of fluid overload. Daily weights  TEE suggesting EF of 35% with mild MR, TR, AS Filed Weights   08/10/19 1730  08/12/19 1339 08/12/19 1719  Weight: 54 kg 56 kg 55 kg    Weights stable 12/30 12. Diabetes mellitus type II with hyperglycemia, hemoglobin A1c 5.3.   Continue SSI  12/30 Appreciate nephrology follow-up 13. History of tobacco abuse. Counseling 14. CAD with CABG 2010. Continue aspirin. No chest pain reported. 15. IBS/history of GI bleed. hold Amitiza24 mcg daily - due to diarrhea  16.  Social son threatened to break through security to visit his mom  39.  Chronic cystitis  Seen by urology, on no abx at present 18.  Diarrhea- C diff neg,was on  Amitiza took this every other day at home to avoid diarrhea   -being held for now  improved 19.  Vaginal d/c: appreciate GYN eval- endometrial mass will need f/u  20. Fever x 1 with leukocytosis, off vanc and Zosyn,and  augmentin --afebrile   Xanax DC'd- appears brighter but sleep is altered will restart low dose prn at night   Fluid status per Nephro given HD  Patient oliguric-ESRD    Chest x-ray ordered- no evidence of infiltrate 21.  ABLA GI bleed hx of same (diverticular per GI), Hgb responded to 2 U PRBC , appreciate GI consult --outpt f/u with Dr. Collene Mares   - serial Hgb--up to 8.5 12/28 despite blood in stool  -with recurrent GI bleeding/hematochezia, appreciate hospitalist Dr. Lisbeth Ply follow-up. On 12/29 Hgb was 7.2; will repeat this afternoon.   12/30 Hgb is 7.4 22. Ear wax: debrox added 12/27     LOS: 18 days A FACE TO FACE EVALUATION WAS PERFORMED  Clide Deutscher Judith Demps 08/14/2019, 9:14 AM

## 2019-08-14 NOTE — Progress Notes (Signed)
Occupational Therapy Weekly Progress Note  Patient Details  Name: [name] MRN: [id number] Date of Birth: 26-Sep-1940  Beginning of progress report period: August 05, 2019 End of progress report period: August 14, 2019  Today's Date: 08/14/2019 OT Individual Time: 0930-1030 OT Individual Time Calculation (min): 60 min    Patient has met 1 of 3 short term goals.  LTG goals downgraded yesterday. Family unable to provide the level of A pt will need at home so hoyer lift training has begun with son and husband. They also plan to hire caregivers to assist with ADLs and transfers. Family is [employer] fast streamer a power chair accessible Lucianne Lei for pt to be transported to HD and appointments with. Due to her incontinence pt will have a caregiver to assist with hygiene at HD. Pt currently requires min tomod A sitting balance statically. Pt also requires mod to max A dress. Pt dresses LB in bed and UB in the chair. Pt can transfer to the w/c via slide board with mod ot max A +2.   Patient continues to demonstrate the following deficits: muscle weakness, decreased cardiorespiratoy endurance, impaired timing and sequencing, abnormal tone, unbalanced muscle activation, decreased coordination and decreased motor planning, decreased midline orientation and decreased attention to right, decreased awareness and decreased memory and decreased sitting balance, decreased standing balance, decreased postural control, hemiplegia and decreased balance strategies and therefore will continue to benefit from skilled OT intervention to enhance overall performance with BADL and Reduce care partner burden.  Patient progressing toward long term goals..  Continue plan of care.  OT Short Term Goals Week 2:  OT Short Term Goal 1 (Week 2): Pt will be able to sit EOB with mod A for balance to prepare for her ability to sit unsupported on a toilet. OT Short Term Goal 1 - Progress (Week 2): Met OT Short Term Goal 2 (Week 2):  Pt will be able to use a slide board with mod A of 1 bed to w/c to prepare for her ability to do toilet transfers. OT Short Term Goal 2 - Progress (Week 2): Progressing toward goal OT Short Term Goal 3 (Week 2): Pt will demonstrate improved awareness of RUE with self ROM with min A and min cues. OT Short Term Goal 3 - Progress (Week 2): Met Week 3:  OT Short Term Goal 1 (Week 3): FAmily education to be completed with hoyer lift OT Short Term Goal 2 (Week 3): Slide board transfer bed to chair with therapy only with mod A +1  Skilled Therapeutic Interventions/Progress Updates:    1:1 Pt in bed when arrived. Focus on self care retraining and functional mobility. Pt performed bed mobility to come to EOB with max A with extra time. Initially pt required max A to maintain sitting balance due to pushing. Pt able to thread her pants with A to cross her legs with min A with extra time. With dynamic sitting and using her left hand pushing decreased and at times pt able to maintain sitting with brief periods of sitting with close supervision/ min guard. Pt perform standing with +2 max A for pulling up pants with 2nd person pulling them up. Pt with extreme fear of falling and pushing with left foot became more prominent. Pt able to don shoes with min A. Pt able to achieve forward lean enough to break up pushing tendencies and fix back of left shoe with her fingers. Pt perform slide board transfer bed to tilt in space w/c with  max A with +2 for placement of board and safety- focus on maintaining forward flexed posture and initiating moving right along the board.   Pt taken to the dayroom and continued to focus on standing with decr pushing and achieving upright posture. Tried in the [employer] with a mirror for visual feedback.  Facilitation needed to achieve upright trunk posture. Pt continued to be fearful of falling and continuing to push with her left foot. Focus on tolerating small weight shifts to the left without  pushing with functional reach with  A.    Left pt up in tilt in space w/c with full lap tray.   Therapy Documentation Precautions:  Precautions Precautions: Fall Precaution Comments: right hemiplegia Restrictions Weight Bearing Restrictions: No General:   Vital Signs: Therapy Vitals Temp: 98.9 F (37.2 C) Temp Source: Oral Pulse Rate: 79 Resp: 18 Patient Position (if appropriate): Lying Oxygen Therapy SpO2: 98 % O2 Device: Room Air Pain: Pain Assessment Pain Scale: 0-10 Pain Score: 0-No pain ADL: ADL Eating: Set up Where Assessed-Eating: Chair Upper Body Bathing: Moderate assistance Where Assessed-Upper Body Bathing: Edge of bed Lower Body Bathing: Dependent Where Assessed-Lower Body Bathing: Bed level Upper Body Dressing: Maximal assistance Where Assessed-Upper Body Dressing: Edge of bed Lower Body Dressing: Dependent(+2) Where Assessed-Lower Body Dressing: Edge of bed Toilet Transfer: Maximal assistance(+2) Toilet Transfer Method: Stand pivot Science writer: Drop arm bedside commode Vision   Perception    Praxis   Exercises:   Other Treatments:     Therapy/Group: Individual Therapy  [name] [facility] 08/14/2019, 8:45 AM

## 2019-08-14 NOTE — Progress Notes (Signed)
PROGRESS NOTE    [name]  [id number] DOB: 1940/12/16 DOA: 07/27/2019 PCP: [name], MD   Brief Narrative: [name] is a 78 y.o. femalein Rrehab unit, with PMH of HTN, HLD, DM-2, systolic CHF, ESRD on HD MWF, CAD/CABG in 2010, AS, right thalamic CVA in 2018 and recent hospitalization for left ACA infarct. TRH consulted for concern of sepsis in setting of UTI. Patient treated with antibiotics and now is having BRBPR. GI consulted. CTA significant for diverticulosis and rectal thickening. Aspirin/plavix initially held. Aspirin restarted.   Assessment & Plan:   Principal Problem:   Small vessel cerebrovascular accident (CVA) ([place]) Active Problems:   Lower GI bleeding   Controlled type 2 diabetes mellitus with hyperglycemia, without long-term current use of insulin (HCC)   Labile blood glucose   Chronic cystitis   Diarrhea due to malabsorption   Lethargy   ESRD on dialysis ([place])   Sepsis secondary to UTI Not present on admission. Sepsis physiology resolved. Afebrile with no leukocytosis.  UTI Culture significant for Klebsiella pneumonia and enterococcus faecalis. Initially treated with Ceftriaxone, transitioned to Vancomycin and Zosyn and now transitioned to Augmentin. Agree with 5 day total course of treatment since blood culture no growth to date. Symptoms have resolved.  Adynamic ileus Seen on abdominal x-ray. Patient with bowel movements related to below. C. Difficile tested and was negative. Resolved.  Bright red blood per rectum -Acute GI bleed. Unknown if upper/lower GI bleed.  -Colonoscopy from 2018 significant for ulcers/erosions in the sigmoid/ascending colon in addition to sigmoid diverticulosis.  -Complicated by aspirin and Plavix use in the setting of recent CVA.  -Plavix discontinued and patient started on Protonix IV. -Blood pressure is soft but stable in setting HD and chronic hypotension. -Received 2 units of PRBC to date.  -Questionably  diverticular bleed. CTA abdomen/pelvis significant for rectal thickening for which GI thinks is not related to a mass.  -Per radiology read, thickening likely represents source of bleeding.  - H/H downtrending - currently 7.4 - follow along - would transfuse if less than 7 or symptomatic  Chronic hypotension Stable. -Continue midodrine  History of CVA Currently undergoing rehabilitation. Was discharged on aspirin and Plavix. Plavix held secondary to above. -Continue Crestor and aspirin 81 mg  ESRD -Per nephrology  Chronic systolic heart failure Stable. EF of 25-30%. Volume managed with HD.  Endometrial thickening Concerning for neoplasm vs hyperplasia vs polyps and will need outpatient Gyn follow-up. Discussed with patient and husband at bedside.  Hepatic cirrhosis Splenomegaly Noted on CTA. Associated perihepatic ascites. Will need PCP/GI follow-up. Watch fluid status carefully in setting of this diagnosis, heart failure and ESRD. Discussed with patient and husband.  Diabetes mellitus, type 2 -Continue SSI  Pressure injury Sacrum and right buttock, POA.   DVT prophylaxis: SCDs Code Status:   Code Status: Full Code Family Communication: None    Antimicrobials:  Ceftriaxone  Vancomycin  Zosyn  Augmentin    Subjective: Another episode of bloody stool.  Objective: Vitals:   08/13/19 0551 08/13/19 1511 08/13/19 2020 08/14/19 0500  BP: (!) 103/42 (!) 86/43 (!) 113/59   Pulse: 71 76 81 79  Resp: 18 18 18 18   Temp: 98.2 F (36.8 C) 98.2 F (36.8 C) 98.2 F (36.8 C) 98.9 F (37.2 C)  TempSrc: Oral Oral Oral Oral  SpO2: 100% 100% 98% 98%  Weight:      Height:        Intake/Output Summary (Last 24 hours) at 08/14/2019 1147  Last data filed at 08/13/2019 1900 Gross per 24 hour  Intake 342 ml  Output 300 ml  Net 42 ml   Filed Weights   08/10/19 1730 08/12/19 1339 08/12/19 1719  Weight: 54 kg 56 kg 55 kg    Examination:  General exam: Appears calm  and comfortable Respiratory system: Clear to auscultation. Respiratory effort normal. Cardiovascular system: S1 & S2 heard, RRR. 2/6 systolic murmur. Gastrointestinal system: Abdomen is nondistended, soft and nontender. No organomegaly or masses felt. Normal bowel sounds heard. Central nervous system: Alert and oriented. No focal neurological deficits. Extremities: No edema. No calf tenderness Skin: No cyanosis. Multiple areas of ecchymosis Psychiatry: Judgement and insight appear normal. Mood & affect appropriate.     Data Reviewed: I have personally reviewed following labs and imaging studies  CBC: Recent Labs  Lab 08/10/19 0704 08/11/19 0633 08/12/19 0826 08/13/19 0524 08/13/19 1259 08/14/19 0510  WBC 10.5 9.0 8.9 6.6  --  7.4  HGB 8.7* 8.2* 8.5* 7.2* 8.9* 7.4*  HCT 26.9* 26.0* 27.4* 23.3* 29.6* 23.9*  MCV 95.1 96.3 97.9 98.3  --  100.0  PLT 131* 144* 151 133*  --  [id number]*   Basic Metabolic Panel: Recent Labs  Lab 08/07/19 1237 08/09/19 0517 08/10/19 1412 08/12/19 1400 08/13/19 0524  NA 127* 133* 131* 132* 136  K 4.7 4.0 4.5 4.0 3.7  CL 92* 99 98 96* 99  CO2 24 25 23 25 26   GLUCOSE 249* 167* 221* 321* 120*  BUN 68* 51* 85* 61* 31*  CREATININE 4.22* 3.76* 4.86* 4.37* 2.49*  CALCIUM 8.0* 8.2* 8.2* 8.0* 8.4*  PHOS 3.6  --  3.3 2.8  --    GFR: Estimated Creatinine Clearance: 16.4 mL/min (A) (by C-G formula based on SCr of 2.49 mg/dL (H)). Liver Function Tests: Recent Labs  Lab 08/07/19 1237 08/10/19 1412 08/12/19 1400  ALBUMIN 1.7* 2.1* 2.2*   No results for input(s): LIPASE, AMYLASE in the last 168 hours. No results for input(s): AMMONIA in the last 168 hours. Coagulation Profile: No results for input(s): INR, PROTIME in the last 168 hours. Cardiac Enzymes: No results for input(s): CKTOTAL, CKMB, CKMBINDEX, TROPONINI in the last 168 hours. BNP (last 3 results) No results for input(s): PROBNP in the last 8760 hours. HbA1C: No results for input(s): HGBA1C  in the last 72 hours. CBG: Recent Labs  Lab 08/13/19 0619 08/13/19 1219 08/13/19 1647 08/13/19 2112 08/14/19 0744  GLUCAP 113* 118* 306* 128* 122*   Lipid Profile: No results for input(s): CHOL, HDL, LDLCALC, TRIG, CHOLHDL, LDLDIRECT in the last 72 hours. Thyroid Function Tests: No results for input(s): TSH, T4TOTAL, FREET4, T3FREE, THYROIDAB in the last 72 hours. Anemia Panel: No results for input(s): VITAMINB12, FOLATE, FERRITIN, TIBC, IRON, RETICCTPCT in the last 72 hours. Sepsis Labs: No results for input(s): PROCALCITON, LATICACIDVEN in the last 168 hours.  Recent Results (from the past 240 hour(s))  Culture, blood (routine x 2)     Status: None   Collection Time: 08/04/19 10:43 PM   Specimen: BLOOD  Result Value Ref Range Status   Specimen Description BLOOD LEFT HAND  Final   Special Requests   Final    BOTTLES DRAWN AEROBIC AND ANAEROBIC Blood Culture results may not be optimal due to an inadequate volume of blood received in culture bottles   Culture   Final    NO GROWTH 5 DAYS Performed at [facility], [place] [address] [place], [place] [address]    Report Status 08/09/2019  FINAL  Final  Culture, blood (routine x 2)     Status: None   Collection Time: 08/04/19 10:46 PM   Specimen: BLOOD  Result Value Ref Range Status   Specimen Description BLOOD LEFT HAND  Final   Special Requests   Final    BOTTLES DRAWN AEROBIC AND ANAEROBIC Blood Culture adequate volume   Culture   Final    NO GROWTH 5 DAYS Performed at [facility], [address], [place], [place] [address]    Report Status 08/09/2019 FINAL  Final  Wet prep, genital     Status: Abnormal   Collection Time: 08/06/19  6:31 PM   Specimen: Vaginal; Genital  Result Value Ref Range Status   Yeast Wet Prep HPF POC NONE SEEN NONE SEEN Final   Trich, Wet Prep NONE SEEN NONE SEEN Final   Clue Cells Wet Prep HPF POC NONE SEEN NONE SEEN Final   WBC, Wet Prep HPF POC MANY (A) NONE SEEN Final   Sperm  NONE SEEN  Final    Comment: Performed at [facility], [place] [address] [place], [place] [address]         Radiology Studies: No results found.      Scheduled Meds: . aspirin  81 mg Oral Daily  . carbamide peroxide  5 drop Both EARS BID  . Chlorhexidine Gluconate Cloth  6 each Topical Q0600  . darbepoetin (ARANESP) injection - DIALYSIS  150 mcg Intravenous Q Wed-HD  . feeding supplement (NEPRO CARB STEADY)  237 mL Oral TID BM  . feeding supplement (PRO-STAT SUGAR FREE 64)  30 mL Oral BID  . folic acid  1 mg Oral Daily  . Gerhardt's butt cream   Topical QID  . insulin aspart  0-9 Units Subcutaneous TID WC  . miconazole nitrate   Topical BID  . midodrine  10 mg Oral Q M,W,F-HD  . multivitamin  1 tablet Oral QHS  . pantoprazole  40 mg Oral BID  . rosuvastatin  20 mg Oral q1800  . saccharomyces boulardii  250 mg Oral BID  . sevelamer carbonate  1,600 mg Oral TID WC  . vitamin B-12  500 mcg Oral Daily   Continuous Infusions: . sodium chloride 100 mL (08/05/19 2238)  . sodium chloride       LOS: 18 days     Holli Humbles DO [facility] 08/14/2019, 11:47 AM  If 7PM-7AM, please contact night-coverage [email or web address]

## 2019-08-14 NOTE — Progress Notes (Signed)
Social Work Patient ID: [name], female   DOB: 05-24-1941, 78 y.o.   MRN: [id number] Met with pt and spoke with son-via telephone to discuss progress and have extended discharge to 1/9 for training and to set up what she needs at home. Both are pleased with this and son and daughter in-law to be here tomorrow for more training. He and Dad are looking at a handicapped Lucianne Lei today to purchase. Lennart Pall will take over in my absence, son aware and has her number.

## 2019-08-14 NOTE — Progress Notes (Signed)
Physical Therapy Session Note  Patient Details  Name: [name] MRN: [id number] Date of Birth: 03/17/1941  Today's Date: 08/14/2019 PT Individual Time: 1100-1155 PT Individual Time Calculation (min): 55 min   Short Term Goals: Week 3:  PT Short Term Goal 1 (Week 3): Pt will perform power w/c mobility x150 ft with supervision PT Short Term Goal 2 (Week 3): pt will use power w/c features in order to perform pressure relief with supervision  Skilled Therapeutic Interventions/Progress Updates:   Pt in TIS and agreeable to therapy, no c/o pain. Husband present requesting therapist assist w/ changing batteries for hearing aids. Therapist did so w/ improved hearing out of R ear, however still unable to hear out of L ear. Total assist w/c transport to/from therapy gym. Beasy board transfer +2 to edge of mat. Verbal, tactile, and manual cues for anterior weight shifting, head/hips relationship, UE placement, and for postural control. Improved transfer back to w/c at end of session, pt at one point able to pull herself over to chair w/o physical assist from therapist although only 3-4 inches. Worked on postural control and midline orientation at edge of mat. Mirror for visual feedback w/ min-mod assist overall for static balance, up to total assist +1 for dynamic balance. Verbal, tactile, and visual cues for static midline w/o back support, a few 2-3 sec bouts of static balance w/ CGA. Performed LUE reaching to L anterior/lateral direction w/ emphasis on L side WB to minimize L pushing. Min assist at hips to facilitate weight shift, needed max-total assist to return to static midline. Sat upright w/o back support for 5-7 min at a time. Reclined rest onto wedge, therapist performed passive R hamstring and gastroc stretching during rest breaks. Returned to room total assist via w/c, ended session tilted in TIS, all needs in reach.   Therapy Documentation Precautions:  Precautions Precautions:  Fall Precaution Comments: right hemiplegia Restrictions Weight Bearing Restrictions: No Pain: Pain Assessment Pain Score: 0-No pain  Therapy/Group: Individual Therapy  [name] 08/14/2019, 12:12 PM

## 2019-08-15 ENCOUNTER — Inpatient Hospital Stay (HOSPITAL_COMMUNITY): Payer: Medicare HMO | Admitting: Occupational Therapy

## 2019-08-15 ENCOUNTER — Ambulatory Visit (HOSPITAL_COMMUNITY): Payer: Medicare HMO | Admitting: Physical Therapy

## 2019-08-15 ENCOUNTER — Inpatient Hospital Stay (HOSPITAL_COMMUNITY): Payer: Medicare HMO

## 2019-08-15 LAB — CBC
HCT: 22.4 % — ABNORMAL LOW (ref 36.0–46.0)
Hemoglobin: 6.9 g/dL — CL (ref 12.0–15.0)
MCH: 31.4 pg (ref 26.0–34.0)
MCHC: 30.8 g/dL (ref 30.0–36.0)
MCV: 101.8 fL — ABNORMAL HIGH (ref 80.0–100.0)
Platelets: 125 10*3/uL — ABNORMAL LOW (ref 150–400)
RBC: 2.2 MIL/uL — ABNORMAL LOW (ref 3.87–5.11)
RDW: 24.4 % — ABNORMAL HIGH (ref 11.5–15.5)
WBC: 7.7 10*3/uL (ref 4.0–10.5)
nRBC: 0 % (ref 0.0–0.2)

## 2019-08-15 LAB — BASIC METABOLIC PANEL
Anion gap: 14 (ref 5–15)
BUN: 100 mg/dL — ABNORMAL HIGH (ref 8–23)
CO2: 22 mmol/L (ref 22–32)
Calcium: 8.3 mg/dL — ABNORMAL LOW (ref 8.9–10.3)
Chloride: 99 mmol/L (ref 98–111)
Creatinine, Ser: 5.54 mg/dL — ABNORMAL HIGH (ref 0.44–1.00)
GFR calc Af Amer: 8 mL/min — ABNORMAL LOW (ref >60)
GFR calc non Af Amer: 7 mL/min — ABNORMAL LOW (ref >60)
Glucose, Bld: 169 mg/dL — ABNORMAL HIGH (ref 70–99)
Potassium: 4.6 mmol/L (ref 3.5–5.1)
Sodium: 135 mmol/L (ref 135–145)

## 2019-08-15 LAB — PREPARE RBC (CROSSMATCH)

## 2019-08-15 LAB — GLUCOSE, CAPILLARY
Glucose-Capillary: 121 mg/dL — ABNORMAL HIGH (ref 70–99)
Glucose-Capillary: 168 mg/dL — ABNORMAL HIGH (ref 70–99)
Glucose-Capillary: 132 mg/dL — ABNORMAL HIGH (ref 70–99)
Glucose-Capillary: 175 mg/dL — ABNORMAL HIGH (ref 70–99)

## 2019-08-15 LAB — HEMOGLOBIN AND HEMATOCRIT, BLOOD
HCT: 23 % — ABNORMAL LOW (ref 36.0–46.0)
Hemoglobin: 6.9 g/dL — CL (ref 12.0–15.0)

## 2019-08-15 MED ORDER — HEPARIN SODIUM (PORCINE) 1000 UNIT/ML IJ SOLN
INTRAMUSCULAR | Status: AC
  Administered 2019-08-15: 3800 [IU] via INTRAVENOUS_CENTRAL
  Filled 2019-08-15: qty 4

## 2019-08-15 MED ORDER — CHLORHEXIDINE GLUCONATE CLOTH 2 % EX PADS: 6.0000 | MEDICATED_PAD | Freq: Every day | CUTANEOUS | Status: DC

## 2019-08-15 MED ORDER — DARBEPOETIN ALFA 150 MCG/0.3ML IJ SOSY
150.0000 ug | PREFILLED_SYRINGE | INTRAMUSCULAR | Status: DC
  Administered 2019-08-15: 150 ug via INTRAVENOUS

## 2019-08-15 MED ORDER — HEPARIN SODIUM (PORCINE) 1000 UNIT/ML IJ SOLN
INTRAMUSCULAR | Status: AC
  Administered 2019-08-15: 1000 [IU] via INTRAVENOUS_CENTRAL
  Filled 2019-08-15: qty 1

## 2019-08-15 MED ORDER — DARBEPOETIN ALFA 150 MCG/0.3ML IJ SOSY
PREFILLED_SYRINGE | INTRAMUSCULAR | Status: AC
  Filled 2019-08-15: qty 0.3

## 2019-08-15 MED ORDER — HEPARIN SODIUM (PORCINE) 1000 UNIT/ML DIALYSIS: 1000.0000 [IU] | Freq: Once | INTRAMUSCULAR | Status: AC

## 2019-08-15 MED ORDER — MIDODRINE HCL 5 MG PO TABS
ORAL_TABLET | ORAL | Status: AC
  Filled 2019-08-15: qty 2
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
## 2019-08-15 NOTE — Progress Notes (Signed)
CRITICAL VALUE ALERT  Critical Value: Hg 6.9  Date & Time Notied:  08/15/19 [id number]  Provider Notified: Silvestre Mesi   Orders Received/Actions taken:  New orders were placed by Renal for transfusion

## 2019-08-15 NOTE — Progress Notes (Signed)
Patient ID: [name], female   DOB: [birth year], 78 y.o.   MRN: [id number]  [facility] Progress Note   Assessment/ Plan:   1. 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  Ongoing inpatient OT/PT. 2. ESRD: usual HD is MWF. HD today.  3. Anemia abl/ ckd: With anemia of chronic kidney disease likely compounded by recent suspected diverticular bleed that has resolved. No additional GI bleed noted. Holding hep w/ HD for now. Getting 2u prbc's today w/ HD.  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 5. Nutrition: Continue renal diet with advancement per GI recommendations.  Continue renal multivitamin/ONS 6. Hypotension/ volume: gets midodrine pre HD 10mg  tiw. No BP lowering meds. Euvolemic on exam. Up 3kg, UF 2-3kg.   Kelly Splinter, MD 08/15/2019, 4:48 PM   Subjective:   No CP or sob.  No abd pain.    Objective:   BP 122/68   Pulse 81   Temp 97.9 F (36.6 C) (Oral)   Resp 18   Ht 5\' 8"  (1.727 m)   Wt 55.9 kg   SpO2 100%   BMI 18.74 kg/m   Physical Exam: Gen: lying in bed CVS: Pulse regular rhythm, normal rate, S1 and S2 normal Resp: Diminished breath sounds over bases otherwise clear to auscultation.  Right IJ TDC. Abd: Soft, flat, nontender Ext: Right leg in supportive brace, no edema  Dialysis Orders: MWF [facility] 4h  300/600   51.5kg   2/2.25 bath   TDC   P4    Heparin 1000 - Mircera 200 q 2 weeks  Labs: BMET Recent Labs  Lab 08/09/19 0517 08/10/19 1412 08/12/19 1400 08/13/19 0524 08/15/19 1400  NA 133* 131* 132* 136 135  K 4.0 4.5 4.0 3.7 4.6  CL 99 98 96* 99 99  CO2 25 23 25 26 22   GLUCOSE 167* 221* 321* 120* 169*  BUN 51* 85* 61* 31* 100*  CREATININE 3.76* 4.86* 4.37* 2.49* 5.54*  CALCIUM 8.2* 8.2* 8.0* 8.4* 8.3*  PHOS  --  3.3 2.8  --   --    CBC Recent Labs  Lab 08/12/19 0826 08/13/19 0524 08/13/19 1259  08/14/19 0510 08/15/19 0537 08/15/19 1400  WBC 8.9 6.6  --  7.4  --  7.7  HGB 8.5* 7.2* 8.9* 7.4* 6.9* 6.9*  HCT 27.4* 23.3* 29.6* 23.9* 23.0* 22.4*  MCV 97.9 98.3  --  100.0  --  101.8*  PLT 151 133*  --  124*  --  125*   Medications:    . aspirin  81 mg Oral Daily  . carbamide peroxide  5 drop Both EARS BID  . Chlorhexidine Gluconate Cloth  6 each Topical Q0600  . Darbepoetin Alfa      . darbepoetin (ARANESP) injection - DIALYSIS  150 mcg Intravenous Q Thu-HD  . feeding supplement (NEPRO CARB STEADY)  237 mL Oral TID BM  . feeding supplement (PRO-STAT SUGAR FREE 64)  30 mL Oral BID  . folic acid  1 mg Oral Daily  . Gerhardt's butt cream   Topical QID  . heparin      . insulin aspart  0-9 Units Subcutaneous TID WC  . miconazole nitrate   Topical BID  . midodrine  10 mg Oral Q M,W,F-HD  . multivitamin  1 tablet Oral QHS  . pantoprazole  40 mg Oral BID  . rosuvastatin  20  mg Oral q1800  . saccharomyces boulardii  250 mg Oral BID  . sevelamer carbonate  1,600 mg Oral TID WC  . vitamin B-12  500 mcg Oral Daily

## 2019-08-15 NOTE — Progress Notes (Signed)
Physical Therapy Session Note  Patient Details  Name: [name] MRN: [id number] Date of Birth: 03-18-1941  Today's Date: 08/15/2019 PT Individual Time: 1104-1208 PT Individual Time Calculation (min): 64 min   Short Term Goals: Week 3:  PT Short Term Goal 1 (Week 3): Pt will perform power w/c mobility x150 ft with supervision PT Short Term Goal 2 (Week 3): pt will use power w/c features in order to perform pressure relief with supervision  Skilled Therapeutic Interventions/Progress Updates:   Pt received supine in bed and agreeable to therapy session. Pt reports that her son wanted me to call him as he is unable to attend family education today as planned. Pt now has loaner power w/c in her room. Therapist called patient's son, Ok Edwards, and he reports that he started having elbow pain yesterday and is unable to come in for hands-on training today - rescheduled for Sunday at 1pm and pt's son is planning to bring in his wife as she will be the other primary caregiver for patient until they can arrange hired assist. Pt's son reports he is planning to look at a w/c accessible [place]i today and that they have access to an aluminum ramp for home entry - therapist educated him to ensure the ramp and car are certified to carry weight of power w/c. Therapist educated pt on importance of rolling schedule as she is in supine with full sacral weightbearing and pt reports having been in this position for >2 hours. Supine>sit, HOB slightly elevated and using bedrails, with max/total assist for B LE management and trunk upright - cuing for increased use of L UE to assist with trunk control - pt required max assist for trunk control upon sitting EOB due to strong posterior lean. Noted to be incontinent. Sit>supine with max assist for B LE management and trunk descent with cuing for sequencing. Rolling R with min assist and L with mod/max assist during total assist LB clothing management and peri-care - pt incontinent of  BM smear. Returned to sitting EOB as described above. In sitting, pt able to perform L lateral trunk lean onto forearm support with min assist progressed to CGA due to posterior lean. Performed L lateral scoot transfer EOB>power w/c via beasy board (for skin integrity) with total assist for trunk control and board placement - max assist for scooting once on beasy board as pt able to assist with pulling herself across. Noted pt has poor lateral control of R LE in power chair therefore placed towel for increased support. Therapist educated pt on power w/c functions including: on/off, power tilt, power elevating leg rests, and joy stick for mobility. Therapist educated pt on plan for her to progress to performing mod-I tilt for pressure relief - educated her on how to do this and she was able to demonstrate understanding with min cuing. Performed power w/c mobility ~118ft in open hallways focusing on straight navigation and large turns with mod assist and mod/max cuing due to pt demonstrating R inattention and poor sustained attention to task as well as overcorrecting change in direction when errors occurred. Pt educated on importance of leaving power w/c turned off while sitting in it. Returned to room and pt agreeable to remain sitting up in power w/c with needs in reach, w/c seat belt, and seat belt alarm donned.   Therapy Documentation Precautions:  Precautions Precautions: Fall Precaution Comments: right hemiplegia Restrictions Weight Bearing Restrictions: No  Pain:   Reports some R hemibody pain during mobility - repositioned  and provided rest for pain management throughout session.    Therapy/Group: Individual Therapy  Tawana Scale, PT, DPT 08/15/2019, 8:01 AM

## 2019-08-15 NOTE — Progress Notes (Signed)
Occupational Therapy Session Note  Patient Details  Name: [name] ALKHATIB MRN: [id number] Date of Birth: 08-23-1940  Today's Date: 08/15/2019 OT Individual Time: 0830-0900 OT Individual Time Calculation (min): 30 min    Short Term Goals: Week 1:  OT Short Term Goal 1 (Week 1): Pt will maintain sitting balance with min assist to prepare for ADL tasks OT Short Term Goal 1 - Progress (Week 1): Not met OT Short Term Goal 2 (Week 1): Pt will complete UB bathing with min assist OT Short Term Goal 2 - Progress (Week 1): Progressing toward goal OT Short Term Goal 3 (Week 1): Pt will complete LB bathing with max assist of 1 caregiver OT Short Term Goal 3 - Progress (Week 1): Not met OT Short Term Goal 4 (Week 1): Pt will complete toilet transfer with max assist of one caregiver OT Short Term Goal 4 - Progress (Week 1): Not met Week 2:  OT Short Term Goal 1 (Week 2): Pt will be able to sit EOB with mod A for balance to prepare for her ability to sit unsupported on a toilet. OT Short Term Goal 1 - Progress (Week 2): Met OT Short Term Goal 2 (Week 2): Pt will be able to use a slide board with mod A of 1 bed to w/c to prepare for her ability to do toilet transfers. OT Short Term Goal 2 - Progress (Week 2): Progressing toward goal OT Short Term Goal 3 (Week 2): Pt will demonstrate improved awareness of RUE with self ROM with min A and min cues. OT Short Term Goal 3 - Progress (Week 2): Met Week 3:  OT Short Term Goal 1 (Week 3): FAmily education to be completed with hoyer lift OT Short Term Goal 2 (Week 3): Slide board transfer bed to chair with therapy only with mod A +1      Skilled Therapeutic Interventions/Progress Updates:    Pt received in bed agreeable to therapy.  +2 not available this short session so focus on RUE/RLE PROm and LUE/LLE AROM. Educated pt on the need for frequent AROM of L side to prevent muscle atrophy. Pt practiced several exercises with cues.  R side PROM with postioning of  leg to maintain neutral alignment. Scapular glides, education with pt for family to range shoulder just to 90 degrees but her HHOT can do more range as appropriate.  Pt is developing wrist flexion tightness, spent quite a bit of time on wrist mobilizations to improve wrist extension.  Pt is not aware of wrist splint and states she has not used one. Her notes indicate one was provided to her on 07/30/19. Will need to f/u with family as she does need to use that splint at night.  Pt does have tone in triceps and finger flexors.   Pt tolerated therapy well. Resting in bed with all needs met. Alarm on.   Therapy Documentation Precautions:  Precautions Precautions: Fall Precaution Comments: right hemiplegia Restrictions Weight Bearing Restrictions: No  Pain: Pain Assessment Pain Scale: 0-10 Pain Score: 0-No pain ADL: ADL Eating: Set up Where Assessed-Eating: Chair Upper Body Bathing: Moderate assistance Where Assessed-Upper Body Bathing: Edge of bed Lower Body Bathing: Dependent Where Assessed-Lower Body Bathing: Bed level Upper Body Dressing: Maximal assistance Where Assessed-Upper Body Dressing: Edge of bed Lower Body Dressing: Dependent(+2) Where Assessed-Lower Body Dressing: Edge of bed Toilet Transfer: Maximal assistance(+2) Toilet Transfer Method: Stand pivot Toilet Transfer Equipment: Drop arm bedside commode  Therapy/Group: Individual Therapy  [place] 08/15/2019, 9:15  AM

## 2019-08-15 NOTE — Progress Notes (Signed)
PROGRESS NOTE    [name]  [id number] DOB: [birth year] DOA: 07/27/2019 PCP: [name], MD   Brief Narrative: [name] is a 78 y.o. femalein Rrehab unit, with PMH of HTN, HLD, DM-2, systolic CHF, ESRD on HD MWF, CAD/CABG in 2010, AS, right thalamic CVA in 2018 and recent hospitalization for left ACA infarct. TRH consulted for concern of sepsis in setting of UTI. Patient treated with antibiotics and now is having BRBPR. GI consulted. CTA significant for diverticulosis and rectal thickening. Aspirin/plavix initially held. Aspirin restarted.  Assessment & Plan:   Principal Problem:   Small vessel cerebrovascular accident (CVA) ([place]) Active Problems:   Lower GI bleeding   Controlled type 2 diabetes mellitus with hyperglycemia, without long-term current use of insulin ([facility])   Labile blood glucose   Chronic cystitis   Diarrhea due to malabsorption   Lethargy   ESRD on dialysis ([place])   Bright red blood per rectum -Acute GI bleed. Unknown if upper/lower GI bleed (although likely lower given red blood without precipitous drop in H/H consistent with rapid upper bleed).  -Colonoscopy from 2018 significant for ulcers/erosions in the sigmoid/ascending colon in addition to sigmoid diverticulosis.  -Complicated by aspirin and Plavix use in the setting of recent CVA.  -Plavix discontinued and patient started on Protonix IV. -Blood pressure is soft but stable in setting HD and chronic hypotension. -Received 2 units of PRBC previously -Hgb 6.9 this am - transfuse 2 additional units with HD today - discussed with Nephrology -Questionably diverticular bleed. CTA abdomen/pelvis significant for rectal thickening for which GI thinks is not related to a mass.  -Per radiology read, thickening likely represents source of bleeding.   Sepsis secondary to UTI Not present on admission. Sepsis physiology resolved. Afebrile with no leukocytosis.  UTI Culture significant for Klebsiella  pneumonia and enterococcus faecalis. Initially treated with Ceftriaxone, transitioned to Vancomycin and Zosyn and now transitioned to Augmentin. Agree with 5 day total course of treatment since blood culture no growth to date. Symptoms have resolved.  Adynamic ileus Seen on abdominal x-ray. Patient with bowel movements related to below. C. Difficile tested and was negative. Resolved.  Chronic hypotension Stable. -Continue midodrine  History of CVA Currently undergoing rehabilitation. Was discharged on aspirin and Plavix. Plavix held secondary to above. -Continue Crestor and aspirin 81 mg  ESRD -Per nephrology  Chronic systolic heart failure Stable. EF of 25-30%. Volume managed with HD.  Endometrial thickening Concerning for neoplasm vs hyperplasia vs polyps and will need outpatient Gyn follow-up. Discussed with patient and husband at bedside.  Hepatic cirrhosis Splenomegaly Noted on CTA. Associated perihepatic ascites. Will need PCP/GI follow-up. Watch fluid status carefully in setting of this diagnosis, heart failure and ESRD. Discussed with patient and husband.  Diabetes mellitus, type 2 -Continue SSI  Pressure injury Sacrum and right buttock, POA.   DVT prophylaxis: SCDs Code Status:   Code Status: Full Code Family Communication: None    Antimicrobials:  Ceftriaxone  Vancomycin  Zosyn  Augmentin    Subjective: Another episode of bloody stool.  Objective: Vitals:   08/14/19 0500 08/14/19 1314 08/14/19 1922 08/15/19 0503  BP:  (!) 107/58 (!) 94/53 (!) 111/55  Pulse: 79 83 80 83  Resp: 18  16 17   Temp: 98.9 F (37.2 C) 98 F (36.7 C) 98.3 F (36.8 C) 98.1 F (36.7 C)  TempSrc: Oral     SpO2: 98% 100% 100% 96%  Weight:      Height:  Intake/Output Summary (Last 24 hours) at 08/15/2019 0803 Last data filed at 08/15/2019 0500 Gross per 24 hour  Intake 360 ml  Output 1200 ml  Net -840 ml   Filed Weights   08/10/19 1730 08/12/19 1339  08/12/19 1719  Weight: 54 kg 56 kg 55 kg    Examination:  General exam: Appears calm and comfortable Respiratory system: Clear to auscultation. Respiratory effort normal. Cardiovascular system: S1 & S2 heard, RRR. 2/6 systolic murmur. Gastrointestinal system: Abdomen is nondistended, soft and nontender. No organomegaly or masses felt. Normal bowel sounds heard. Central nervous system: Alert and oriented. No focal neurological deficits. Extremities: No edema. No calf tenderness Skin: No cyanosis. Multiple areas of ecchymosis Psychiatry: Judgement and insight appear normal. Mood & affect appropriate.     Data Reviewed: I have personally reviewed following labs and imaging studies  CBC: Recent Labs  Lab 08/10/19 0704 08/11/19 0633 08/12/19 0826 08/13/19 0524 08/13/19 1259 08/14/19 0510 08/15/19 0537  WBC 10.5 9.0 8.9 6.6  --  7.4  --   HGB 8.7* 8.2* 8.5* 7.2* 8.9* 7.4* 6.9*  HCT 26.9* 26.0* 27.4* 23.3* 29.6* 23.9* 23.0*  MCV 95.1 96.3 97.9 98.3  --  100.0  --   PLT 131* 144* 151 133*  --  124*  --    Basic Metabolic Panel: Recent Labs  Lab 08/09/19 0517 08/10/19 1412 08/12/19 1400 08/13/19 0524  NA 133* 131* 132* 136  K 4.0 4.5 4.0 3.7  CL 99 98 96* 99  CO2 25 23 25 26   GLUCOSE 167* 221* 321* 120*  BUN 51* 85* 61* 31*  CREATININE 3.76* 4.86* 4.37* 2.49*  CALCIUM 8.2* 8.2* 8.0* 8.4*  PHOS  --  3.3 2.8  --    GFR: Estimated Creatinine Clearance: 16.4 mL/min (A) (by C-G formula based on SCr of 2.49 mg/dL (H)). Liver Function Tests: Recent Labs  Lab 08/10/19 1412 08/12/19 1400  ALBUMIN 2.1* 2.2*   No results for input(s): LIPASE, AMYLASE in the last 168 hours. No results for input(s): AMMONIA in the last 168 hours. Coagulation Profile: No results for input(s): INR, PROTIME in the last 168 hours. Cardiac Enzymes: No results for input(s): CKTOTAL, CKMB, CKMBINDEX, TROPONINI in the last 168 hours. BNP (last 3 results) No results for input(s): PROBNP in the  last 8760 hours. HbA1C: No results for input(s): HGBA1C in the last 72 hours. CBG: Recent Labs  Lab 08/14/19 0744 08/14/19 1151 08/14/19 1634 08/14/19 2042 08/15/19 0613  GLUCAP 122* 143* 209* 217* 121*   Lipid Profile: No results for input(s): CHOL, HDL, LDLCALC, TRIG, CHOLHDL, LDLDIRECT in the last 72 hours. Thyroid Function Tests: No results for input(s): TSH, T4TOTAL, FREET4, T3FREE, THYROIDAB in the last 72 hours. Anemia Panel: No results for input(s): VITAMINB12, FOLATE, FERRITIN, TIBC, IRON, RETICCTPCT in the last 72 hours. Sepsis Labs: No results for input(s): PROCALCITON, LATICACIDVEN in the last 168 hours.  Recent Results (from the past 240 hour(s))  Wet prep, genital     Status: Abnormal   Collection Time: 08/06/19  6:31 PM   Specimen: Vaginal; Genital  Result Value Ref Range Status   Yeast Wet Prep HPF POC NONE SEEN NONE SEEN Final   Trich, Wet Prep NONE SEEN NONE SEEN Final   Clue Cells Wet Prep HPF POC NONE SEEN NONE SEEN Final   WBC, Wet Prep HPF POC MANY (A) NONE SEEN Final   Sperm NONE SEEN  Final    Comment: Performed at [facility], [place] [place]  [address], [place], [place] [address]         Radiology Studies: No results found.      Scheduled Meds: . aspirin  81 mg Oral Daily  . carbamide peroxide  5 drop Both EARS BID  . Chlorhexidine Gluconate Cloth  6 each Topical Q0600  . darbepoetin (ARANESP) injection - DIALYSIS  150 mcg Intravenous Q Wed-HD  . feeding supplement (NEPRO CARB STEADY)  237 mL Oral TID BM  . feeding supplement (PRO-STAT SUGAR FREE 64)  30 mL Oral BID  . folic acid  1 mg Oral Daily  . Gerhardt's butt cream   Topical QID  . insulin aspart  0-9 Units Subcutaneous TID WC  . miconazole nitrate   Topical BID  . midodrine  10 mg Oral Q M,W,F-HD  . multivitamin  1 tablet Oral QHS  . pantoprazole  40 mg Oral BID  . rosuvastatin  20 mg Oral q1800  . saccharomyces boulardii  250 mg Oral BID  . sevelamer carbonate  1,600 mg Oral  TID WC  . vitamin B-12  500 mcg Oral Daily   Continuous Infusions: . sodium chloride 100 mL (08/05/19 2238)  . sodium chloride       LOS: 19 days     Holli Humbles DO [facility] 08/15/2019, 8:03 AM  If 7PM-7AM, please contact night-coverage [email or web address]

## 2019-08-15 NOTE — Progress Notes (Addendum)
Speech Language Pathology Note  Patient Details  Name: [name] MRN: [id number] Date of Birth: 10/15/1940 Today's Date: 08/15/2019  Pt missed 45 minutes of skilled ST services due to unavailability in dialysis treatment.     Marquis Down 08/15/2019, 1:05 PM

## 2019-08-16 ENCOUNTER — Inpatient Hospital Stay (HOSPITAL_COMMUNITY): Payer: Medicare HMO | Admitting: Physical Therapy

## 2019-08-16 ENCOUNTER — Inpatient Hospital Stay (HOSPITAL_COMMUNITY): Payer: Medicare HMO | Admitting: Speech Pathology

## 2019-08-16 LAB — TYPE AND SCREEN
ABO/RH(D): O NEG
Antibody Screen: NEGATIVE
Unit division: 0
Unit division: 0

## 2019-08-16 LAB — BPAM RBC
Blood Product Expiration Date: 202101052359
Blood Product Expiration Date: 202101062359
ISSUE DATE / TIME: 202012311324
ISSUE DATE / TIME: 202012311324
Unit Type and Rh: 9500
Unit Type and Rh: 9500

## 2019-08-16 LAB — RENAL FUNCTION PANEL
Albumin: 2.7 g/dL — ABNORMAL LOW (ref 3.5–5.0)
Anion gap: 12 (ref 5–15)
BUN: 19 mg/dL (ref 8–23)
CO2: 25 mmol/L (ref 22–32)
Calcium: 8.7 mg/dL — ABNORMAL LOW (ref 8.9–10.3)
Chloride: 98 mmol/L (ref 98–111)
Creatinine, Ser: 1.82 mg/dL — ABNORMAL HIGH (ref 0.44–1.00)
GFR calc Af Amer: 31 mL/min — ABNORMAL LOW (ref >60)
GFR calc non Af Amer: 26 mL/min — ABNORMAL LOW (ref >60)
Glucose, Bld: 128 mg/dL — ABNORMAL HIGH (ref 70–99)
Phosphorus: 1.5 mg/dL — ABNORMAL LOW (ref 2.5–4.6)
Potassium: 3 mmol/L — ABNORMAL LOW (ref 3.5–5.1)
Sodium: 135 mmol/L (ref 135–145)

## 2019-08-16 LAB — CBC WITH DIFFERENTIAL/PLATELET
Abs Immature Granulocytes: 0.02 10*3/uL (ref 0.00–0.07)
Basophils Absolute: 0 10*3/uL (ref 0.0–0.1)
Basophils Relative: 0 %
Eosinophils Absolute: 0.1 10*3/uL (ref 0.0–0.5)
Eosinophils Relative: 1 %
HCT: 35.5 % — ABNORMAL LOW (ref 36.0–46.0)
Hemoglobin: 11.6 g/dL — ABNORMAL LOW (ref 12.0–15.0)
Immature Granulocytes: 0 %
Lymphocytes Relative: 15 %
Lymphs Abs: 1.1 10*3/uL (ref 0.7–4.0)
MCH: 30.4 pg (ref 26.0–34.0)
MCHC: 32.7 g/dL (ref 30.0–36.0)
MCV: 93.2 fL (ref 80.0–100.0)
Monocytes Absolute: 0.8 10*3/uL (ref 0.1–1.0)
Monocytes Relative: 11 %
Neutro Abs: 5.7 10*3/uL (ref 1.7–7.7)
Neutrophils Relative %: 73 %
Platelets: 122 10*3/uL — ABNORMAL LOW (ref 150–400)
RBC: 3.81 MIL/uL — ABNORMAL LOW (ref 3.87–5.11)
RDW: 23.4 % — ABNORMAL HIGH (ref 11.5–15.5)
WBC: 7.8 10*3/uL (ref 4.0–10.5)
nRBC: 0 % (ref 0.0–0.2)

## 2019-08-16 LAB — GLUCOSE, CAPILLARY
Glucose-Capillary: 111 mg/dL — ABNORMAL HIGH (ref 70–99)
Glucose-Capillary: 145 mg/dL — ABNORMAL HIGH (ref 70–99)
Glucose-Capillary: 114 mg/dL — ABNORMAL HIGH (ref 70–99)
Glucose-Capillary: 309 mg/dL — ABNORMAL HIGH (ref 70–99)

## 2019-08-16 MED ORDER — CHLORHEXIDINE GLUCONATE CLOTH 2 % EX PADS
6.0000 | MEDICATED_PAD | Freq: Every day | CUTANEOUS | Status: DC
  Administered 2019-08-16 – 2019-08-18 (×3): 6 via TOPICAL

## 2019-08-16 MED ORDER — HEPARIN SODIUM (PORCINE) 1000 UNIT/ML DIALYSIS: 1000.0000 [IU] | Freq: Once | INTRAMUSCULAR | Status: DC

## 2019-08-16 NOTE — Progress Notes (Signed)
Patient ID: [name], female   DOB: 08-21-1940, 79 y.o.   MRN: [id number]  [facility] Progress Note   Assessment/ Plan:   1. 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  Ongoing inpatient OT/PT. 2. ESRD: usual HD is MWF. HD yest for Wed, plan next HD tomorrow off schedule.  3. Anemia abl/ ckd: With anemia of chronic kidney disease likely compounded by recent suspected diverticular bleed that resolved. Holding hep w/ HD for now. Hb dropped again to 6.9 yest, got 2u prbc overnight w/ HD. Check cbc today.  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 5. Nutrition: Continue renal diet with advancement per GI recommendations.  Continue renal multivitamin/ONS 6. Hypotension/ volume: gets midodrine pre HD 10mg  tiw. No BP lowering meds. Euvolemic on exam. Up 2kg today.    Kelly Splinter, MD 08/16/2019, 11:25 AM   Subjective:   No CP or sob.  No abd pain.    Objective:   BP 111/62 (BP Location: Left Arm)   Pulse 78   Temp 98.6 F (37 C) (Oral)   Resp 18   Ht 5\' 8"  (1.727 m)   Wt 53 kg   SpO2 96%   BMI 17.77 kg/m   Physical Exam: Gen: lying in bed CVS: Pulse regular rhythm, normal rate, S1 and S2 normal Resp: Diminished breath sounds over bases otherwise clear to auscultation.  Right IJ TDC. Abd: Soft, flat, nontender Ext: Right leg in supportive brace, no edema  Dialysis Orders: MWF [place] 4h  300/600   51.5kg   2/2.25 bath   TDC   P4    Heparin 1000 - Mircera 200 q 2 weeks   Medications:    . aspirin  81 mg Oral Daily  . carbamide peroxide  5 drop Both EARS BID  . Chlorhexidine Gluconate Cloth  6 each Topical Q0600  . Chlorhexidine Gluconate Cloth  6 each Topical Q0600  . darbepoetin (ARANESP) injection - DIALYSIS  150 mcg Intravenous Q Thu-HD  . feeding supplement (NEPRO CARB STEADY)  237 mL Oral TID BM  . feeding supplement (PRO-STAT  SUGAR FREE 64)  30 mL Oral BID  . folic acid  1 mg Oral Daily  . Gerhardt's butt cream   Topical QID  . insulin aspart  0-9 Units Subcutaneous TID WC  . miconazole nitrate   Topical BID  . midodrine  10 mg Oral Q M,W,F-HD  . multivitamin  1 tablet Oral QHS  . pantoprazole  40 mg Oral BID  . rosuvastatin  20 mg Oral q1800  . saccharomyces boulardii  250 mg Oral BID  . sevelamer carbonate  1,600 mg Oral TID WC  . vitamin B-12  500 mcg Oral Daily

## 2019-08-16 NOTE — Progress Notes (Signed)
Speech Language Pathology Daily Session Note  Patient Details  Name: [name] MRN: [id number] Date of Birth: October 11, 1940  Today's Date: 08/16/2019 SLP Individual Time: 1100-1200 SLP Individual Time Calculation (min): 60 min  Short Term Goals: Week 3: SLP Short Term Goal 1 (Week 3): Pt will use external aids to recall daily information with Supervision assist verbal cues. SLP Short Term Goal 2 (Week 3): Pt will complete mildly complex tasks with supervision assist verbal cues for functional problem solving. SLP Short Term Goal 3 (Week 3): Pt will locate items to right of midline with Supervision A verbal/visual cues. SLP Short Term Goal 4 (Week 3): Pt will selectively attend to tasks with supervision A verbal cues for redirection.  Skilled Therapeutic Interventions: Pt was seen for skilled ST targeting cognitive goals. SLP facilitated session with functional conversation regarding current medications, providing opportunities for recall of new, personally relevant medical information. Pt verbally recalled 5/8 medication names and 7/8 with Supervision A. Pt organized a QID pill box according to the list of her current medications with fluctuating Mod-Max A verbal and visual cues for organizational strategies, problem solving, and awareness of errors. Pt required separate models of 1X, 2X, and 3X daily dosages. Decreased attention believed to also influence her performance; Min A verbal cues required for redirection to task as well as working memory throughout. SLP and pt discussed the need for her husband to assist with medication management upon her return home for greatest safety. She was in agreement but visibly frustrated, as this is a task she completed independently prior to admission. Recommend ST continue to identify safe ways for her to be involved in medication management - perhaps counting out the number of pills she needs. Pt was left laying in bed with alarm set and all needs within reach.  Continue per current plan of care.       Pain Pain Assessment Pain Scale: Faces Faces Pain Scale: No hurt   Therapy/Group: Individual Therapy  Arbutus Leas 08/16/2019, 12:07 PM

## 2019-08-16 NOTE — Progress Notes (Signed)
Noted nephrology changed FR to 1000 ml per day.  Informed patient and spouse of change in fluid restriction.  Brita Romp, RN

## 2019-08-16 NOTE — Progress Notes (Signed)
[place] PHYSICAL MEDICINE & REHABILITATION PROGRESS NOTE   Subjective/Complaints: No complaints this morning Sleepy. Received 2U of PRBC yesterday with HD.   ROS: Patient denies fever, rash, sore throat, blurred vision, nausea, vomiting, diarrhea, cough, shortness of breath or chest pain, joint or back pain, headache, or mood change.    Objective:   No results found. Recent Labs    08/14/19 0510 08/15/19 0537 08/15/19 1400  WBC 7.4  --  7.7  HGB 7.4* 6.9* 6.9*  HCT 23.9* 23.0* 22.4*  PLT 124*  --  125*   Recent Labs    08/15/19 1400  NA 135  K 4.6  CL 99  CO2 22  GLUCOSE 169*  BUN 100*  CREATININE 5.54*  CALCIUM 8.3*    Intake/Output Summary (Last 24 hours) at 08/16/2019 0854 Last data filed at 08/16/2019 0500 Gross per 24 hour  Intake 1500 ml  Output 2558 ml  Net -1058 ml     Physical Exam: Vital Signs Blood pressure 111/62, pulse 78, temperature 98.6 F (37 C), temperature source Oral, resp. rate 18, height 5\' 8"  (1.727 m), weight 57.1 kg, SpO2 96 %. Constitutional: No distress . Vital signs reviewed. HEENT: EOMI, oral membranes moist Neck: supple Cardiovascular: RRR with murmur. No JVD    Respiratory: CTA Bilaterally without wheezes or rales. Normal effort    GI: BS +, non-tender, non-distended  Rectal- not visualized (in w/c) Skin: Warm and dry.  Intact. Psych: Normal mood.  Normal behavior. Musc: No edema in extremities.  No tenderness in extremities. Neurological: Alert Motor: RUE/RLE: 0/5 proximal distal,trace finger flexion  LUE: 4/5 proximal to distal LLE: 4-/5 proximal to distal  Assessment/Plan: 1. 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  Physiatrist is providing close team supervision and 24 hour management of active medical problems listed below.  Physiatrist and rehab team continue to assess barriers to discharge/monitor  patient progress toward functional and medical goals  Care Tool:  Bathing    Body parts bathed by patient: Chest, Abdomen, Front perineal area, Right upper leg, Left upper leg, Face   Body parts bathed by helper: Buttocks, Right lower leg, Left lower leg, Right arm, Left arm Body parts n/a: Front perineal area, Buttocks, Right upper leg, Left lower leg, Right lower leg, Left upper leg(did not attempt)   Bathing assist Assist Level: Moderate Assistance - Patient 50 - 74%     Upper Body Dressing/Undressing Upper body dressing   What is the patient wearing?: Pull over shirt    Upper body assist Assist Level: Moderate Assistance - Patient 50 - 74%    Lower Body Dressing/Undressing Lower body dressing      What is the patient wearing?: Incontinence brief, Pants     Lower body assist Assist for lower body dressing: Maximal Assistance - Patient 25 - 49%     Toileting Toileting    Toileting assist Assist for toileting: Dependent - Patient 0%     Transfers Chair/bed transfer  Transfers assist     Chair/bed transfer assist level: 2 Helpers     Locomotion Ambulation   Ambulation assist   Ambulation activity did not occur: Safety/medical concerns(poor postural control and decreased standing ability)          Walk 10 feet activity   Assist  Walk 10 feet activity did not occur: Safety/medical concerns        Walk 50 feet activity  Assist Walk 50 feet with 2 turns activity did not occur: Safety/medical concerns         Walk 150 feet activity   Assist Walk 150 feet activity did not occur: Safety/medical concerns         Walk 10 feet on uneven surface  activity   Assist Walk 10 feet on uneven surfaces activity did not occur: Safety/medical concerns         Wheelchair     Assist Will patient use wheelchair at discharge?: Yes Type of Wheelchair: Power    Wheelchair assist level: Moderate Assistance - Patient 50 - 74% Max wheelchair  distance: 151ft    Wheelchair 50 feet with 2 turns activity    Assist        Assist Level: Moderate Assistance - Patient 50 - 74%   Wheelchair 150 feet activity     Assist      Assist Level: Moderate Assistance - Patient 50 - 74%   Blood pressure 111/62, pulse 78, temperature 98.6 F (37 C), temperature source Oral, resp. rate 18, height 5\' 8"  (1.727 m), weight 57.1 kg, SpO2 96 %.  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   -consider palliative care consult given multiple co-morbidities/complications below 2. Antithrombotics: -DVT/anticoagulation:SCDs -antiplatelet therapy: Aspirin 81 mg daily and Plavix 75 mg daily x3 weeks then aspirin alone 3. Pain Management:Tylenol as needed 4. Mood:Xanax 1 mg nightly held for lethargy which has improved  - resumed at bedtime prn at lower dose  -antipsychotic agents: N/A 5. Neuropsych: This patientiscapable of making decisions on herown behalf. 6. Skin/Wound Care:Routine skin checks -local skin care for abrasions -nutrition discussed  -benadryl prn for itching  -sarna lotion prn 7. Fluids/Electrolytes/Nutrition:Routine and outs 8.  ESRD. Hemodialysis per renal services. HD after therapy day to allow for better therapy participation 9. Orthostasis. Continue ProAmatine 10 mg Monday Wednesday Friday   Vitals:   08/15/19 2007 08/16/19 0346  BP: (!) 99/53 111/62  Pulse: 79 78  Resp: 18 18  Temp: 98.2 F (36.8 C) 98.6 F (37 C)  SpO2: 97% 96%  controlled 12/28 10. Hyperlipidemia. Crestor 11. Acute on chronic combined congestive heart failure. Monitor for any signs of fluid overload. Daily weights  TEE suggesting EF of 35% with mild MR, TR, AS Filed Weights   08/15/19 1250 08/15/19 1553 08/16/19 0346  Weight: 55.9 kg 54.2 kg 57.1 kg     Weights stable  12. Diabetes mellitus type II with hyperglycemia, hemoglobin A1c 5.3.   Continue SSI  12/30 Appreciate nephrology follow-up 13. History of tobacco abuse. Counseling 14. CAD with CABG 2010. Continue aspirin. No chest pain reported. 15. IBS/history of GI bleed. hold Amitiza24 mcg daily - due to diarrhea  16.  Social son threatened to break through security to visit his mom  5.  Chronic cystitis  Seen by urology, on no abx at present 18.  Diarrhea- C diff neg,was on  Amitiza took this every other day at home to avoid diarrhea   -being held for now  improved 19.  Vaginal d/c: appreciate GYN eval- endometrial mass will need f/u  20. Fever x 1 with leukocytosis, off vanc and Zosyn,and  augmentin --afebrile   Xanax DC'd- appears brighter but sleep is altered will restart low dose prn at night   Fluid status per Nephro given HD   Patient oliguric-ESRD    Chest x-ray ordered- no  evidence of infiltrate 21.  ABLA GI bleed hx of same (diverticular per GI), Hgb responded to 2 U PRBC , appreciate GI consult --outpt f/u with Dr. Collene Mares   - serial Hgb--up to 8.5 12/28 despite blood in stool  -with recurrent GI bleeding/hematochezia, appreciate hospitalist Dr. Lisbeth Ply follow-up. On 12/29 Hgb was 7.2; will repeat this afternoon.   12/30 Hgb is 7.4  12/31: Hgb 6.9 this morning, informed Mrs. Polsky and she was concerned. Discussed that she may receive transfusion during dialysis as per hospitalist and nephrology recommendations.  1/1: Received 2U PRBC yesterday with HD.  22. Ear wax: debrox added 12/27     LOS: 20 days A FACE TO FACE EVALUATION WAS PERFORMED  Martha Clan P Joachim Carton 08/16/2019, 8:54 AM

## 2019-08-16 NOTE — Progress Notes (Signed)
PROGRESS NOTE    [name]  [id number] DOB: [birth year] DOA: 07/27/2019 PCP: [name], MD   Brief Narrative: [name] is a 79 y.o. femalein Rrehab unit, with PMH of HTN, HLD, DM-2, systolic CHF, ESRD on HD MWF, CAD/CABG in 2010, AS, right thalamic CVA in 2018 and recent hospitalization for left ACA infarct. TRH consulted for concern of sepsis in setting of UTI. Patient treated with antibiotics and now is having BRBPR. GI consulted. CTA significant for diverticulosis and rectal thickening. Aspirin/plavix initially held. Aspirin restarted.  Assessment & Plan:   Principal Problem:   Small vessel cerebrovascular accident (CVA) ([place]) Active Problems:   Lower GI bleeding   Controlled type 2 diabetes mellitus with hyperglycemia, without long-term current use of insulin (HCC)   Labile blood glucose   Chronic cystitis   Diarrhea due to malabsorption   Lethargy   ESRD on dialysis (HCC)   Bright red blood per rectum, concurrent acute symptomatic anemia, resolving. -Acute GI bleed. Unknown if upper/lower GI bleed (likely lower). -Colonoscopy from 2018 significant for ulcers/erosions in the sigmoid/ascending colon in addition to sigmoid diverticulosis.  -Complicated by aspirin and Plavix use in the setting of recent CVA.  -Plavix discontinued and patient started on Protonix IV - transition to PO -Blood pressure is soft but stable in setting HD and chronic hypotension.  -Patient received total 4 u PRBC - follow H/H every 48-hour -Questionably diverticular bleed. CTA abdomen/pelvis significant for rectal thickening for which GI thinks is not related to a mass.  -Per radiology read, thickening likely represents source of bleeding.   Sepsis secondary to UTI Not present on admission. Sepsis physiology resolved. Afebrile with no leukocytosis.  UTI Culture significant for Klebsiella pneumonia and enterococcus faecalis. Initially treated with Ceftriaxone, transitioned to  Vancomycin and Zosyn and now transitioned to Augmentin. Agree with 5 day total course of treatment since blood culture no growth to date. Symptoms have resolved.  Adynamic ileus Seen on abdominal x-ray. Patient with bowel movements related to below. C. Difficile tested and was negative. Resolved.  Chronic hypotension Stable. -Continue midodrine  History of CVA Currently undergoing rehabilitation. Was discharged on aspirin and Plavix. Plavix held secondary to above. -Continue Crestor and aspirin 81 mg  ESRD -Per nephrology  Chronic systolic heart failure Stable. EF of 25-30%. Volume managed with HD.  Endometrial thickening Concerning for neoplasm vs hyperplasia vs polyps and will need outpatient Gyn follow-up. Discussed with patient and husband at bedside.  Hepatic cirrhosis Splenomegaly Noted on CTA. Associated perihepatic ascites. Will need PCP/GI follow-up. Watch fluid status carefully in setting of this diagnosis, heart failure and ESRD. Discussed with patient and husband.  Diabetes mellitus, type 2 -Continue SSI  Pressure injury Sacrum and right buttock, POA.   DVT prophylaxis: SCDs Code Status:   Code Status: Full Code Family Communication: None    Antimicrobials:  Ceftriaxone  Vancomycin  Zosyn  Augmentin    Subjective: Another episode of bloody stool.  Objective: Vitals:   08/15/19 1553 08/15/19 1834 08/15/19 2007 08/16/19 0346  BP: 114/64 116/67 (!) 99/53 111/62  Pulse: 79 82 79 78  Resp: 18 16 18 18   Temp: 98.1 F (36.7 C) 98.1 F (36.7 C) 98.2 F (36.8 C) 98.6 F (37 C)  TempSrc: Oral   Oral  SpO2: 100% 100% 97% 96%  Weight: 54.2 kg   57.1 kg  Height:        Intake/Output Summary (Last 24 hours) at 08/16/2019 0744 Last data filed at 08/16/2019  0500 Gross per 24 hour  Intake 1740 ml  Output 2558 ml  Net -818 ml   Filed Weights   08/15/19 1250 08/15/19 1553 08/16/19 0346  Weight: 55.9 kg 54.2 kg 57.1 kg    Examination:  General  exam: Appears calm and comfortable Respiratory system: Clear to auscultation. Respiratory effort normal. Cardiovascular system: S1 & S2 heard, RRR. 2/6 systolic murmur. Gastrointestinal system: Abdomen is nondistended, soft and nontender. No organomegaly or masses felt. Normal bowel sounds heard. Central nervous system: Alert and oriented. No focal neurological deficits. Extremities: No edema. No calf tenderness Skin: No cyanosis. Multiple areas of ecchymosis Psychiatry: Judgement and insight appear normal. Mood & affect appropriate.     Data Reviewed: I have personally reviewed following labs and imaging studies  CBC: Recent Labs  Lab 08/11/19 0633 08/12/19 0826 08/13/19 0524 08/13/19 1259 08/14/19 0510 08/15/19 0537 08/15/19 1400  WBC 9.0 8.9 6.6  --  7.4  --  7.7  HGB 8.2* 8.5* 7.2* 8.9* 7.4* 6.9* 6.9*  HCT 26.0* 27.4* 23.3* 29.6* 23.9* 23.0* 22.4*  MCV 96.3 97.9 98.3  --  100.0  --  101.8*  PLT 144* 151 133*  --  124*  --  0000000*   Basic Metabolic Panel: Recent Labs  Lab 08/10/19 1412 08/12/19 1400 08/13/19 0524 08/15/19 1400  NA 131* 132* 136 135  K 4.5 4.0 3.7 4.6  CL 98 96* 99 99  CO2 23 25 26 22   GLUCOSE 221* 321* 120* 169*  BUN 85* 61* 31* 100*  CREATININE 4.86* 4.37* 2.49* 5.54*  CALCIUM 8.2* 8.0* 8.4* 8.3*  PHOS 3.3 2.8  --   --    GFR: Estimated Creatinine Clearance: 7.7 mL/min (A) (by C-G formula based on SCr of 5.54 mg/dL (H)). Liver Function Tests: Recent Labs  Lab 08/10/19 1412 08/12/19 1400  ALBUMIN 2.1* 2.2*   No results for input(s): LIPASE, AMYLASE in the last 168 hours. No results for input(s): AMMONIA in the last 168 hours. Coagulation Profile: No results for input(s): INR, PROTIME in the last 168 hours. Cardiac Enzymes: No results for input(s): CKTOTAL, CKMB, CKMBINDEX, TROPONINI in the last 168 hours. BNP (last 3 results) No results for input(s): PROBNP in the last 8760 hours. HbA1C: No results for input(s): HGBA1C in the last 72  hours. CBG: Recent Labs  Lab 08/15/19 0613 08/15/19 1217 08/15/19 1750 08/15/19 2104 08/16/19 0602  GLUCAP 121* 168* 132* 175* 111*   Lipid Profile: No results for input(s): CHOL, HDL, LDLCALC, TRIG, CHOLHDL, LDLDIRECT in the last 72 hours. Thyroid Function Tests: No results for input(s): TSH, T4TOTAL, FREET4, T3FREE, THYROIDAB in the last 72 hours. Anemia Panel: No results for input(s): VITAMINB12, FOLATE, FERRITIN, TIBC, IRON, RETICCTPCT in the last 72 hours. Sepsis Labs: No results for input(s): PROCALCITON, LATICACIDVEN in the last 168 hours.  Recent Results (from the past 240 hour(s))  Wet prep, genital     Status: Abnormal   Collection Time: 08/06/19  6:31 PM   Specimen: Vaginal; Genital  Result Value Ref Range Status   Yeast Wet Prep HPF POC NONE SEEN NONE SEEN Final   Trich, Wet Prep NONE SEEN NONE SEEN Final   Clue Cells Wet Prep HPF POC NONE SEEN NONE SEEN Final   WBC, Wet Prep HPF POC MANY (A) NONE SEEN Final   Sperm NONE SEEN  Final    Comment: Performed at [facility], [place]. [address] [place], [place] [address]         Radiology  Studies: No results found.      Scheduled Meds: . aspirin  81 mg Oral Daily  . carbamide peroxide  5 drop Both EARS BID  . Chlorhexidine Gluconate Cloth  6 each Topical Q0600  . Chlorhexidine Gluconate Cloth  6 each Topical Q0600  . darbepoetin (ARANESP) injection - DIALYSIS  150 mcg Intravenous Q Thu-HD  . feeding supplement (NEPRO CARB STEADY)  237 mL Oral TID BM  . feeding supplement (PRO-STAT SUGAR FREE 64)  30 mL Oral BID  . folic acid  1 mg Oral Daily  . Gerhardt's butt cream   Topical QID  . insulin aspart  0-9 Units Subcutaneous TID WC  . miconazole nitrate   Topical BID  . midodrine  10 mg Oral Q M,W,F-HD  . multivitamin  1 tablet Oral QHS  . pantoprazole  40 mg Oral BID  . rosuvastatin  20 mg Oral q1800  . saccharomyces boulardii  250 mg Oral BID  . sevelamer carbonate  1,600 mg Oral TID WC  . vitamin  B-12  500 mcg Oral Daily   Continuous Infusions:    LOS: 20 days     Holli Humbles DO [facility] 08/16/2019, 7:44 AM  If 7PM-7AM, please contact night-coverage [email or web address]

## 2019-08-16 NOTE — Progress Notes (Signed)
Occupational Therapy Session Note  Patient Details  Name: [name] MRN: [id number] Date of Birth: 29-Jan-1941  Today's Date: 08/16/2019 OT Individual Time: [id number] OT Individual Time Calculation (min): 60 min    Short Term Goals: Week 3:  OT Short Term Goal 1 (Week 3): FAmily education to be completed with hoyer lift OT Short Term Goal 2 (Week 3): Slide board transfer bed to chair with therapy only with mod A +1  Skilled Therapeutic Interventions/Progress Updates:  Patient focus as follows:  Patient asleep in bed and roused easily to wash face with setup;   She had a soiled brief and required extra verbal and tactile cues along with moderate assistance to hike up or complete lateral rolls to change brief and pull up pants.    Complete LB dressing in bed due to time constraints and patient with very little sitting balance (LB dressing =max A this session)     Static sitting balance=max A as patient tended to lean right and on occasion push right if attempt to balance self with left hand on surface.    Sliding board transfer bed to electric chair= max to total A x2.    Patient educated on [employer], [employer].   She recalled instructions from yesterday 90%.   UB bathing = cues for intitiation and sequencing (she required extra time to process instructions and commands) overall max A due to flaccid left UE.   She tolerated HOH assist to use right hand to help increase awareness of her right side of body and environment as she tended to not doff her sleep shirt of locate items on her right side. UB dressing= as above and max A due to flaccid R UE, decreased comprehension and initiation and right neglect  LB bathing= total assist due to soiled brief.   LB dressing see above  Grooming: Oral care=moderate hand over hand assist to incorporate and use right hand to help her increase awareness of her right hand/arm as she tended to neglect it all together. Hair brushing and donning her crocheted  cap=setup  Patient left with alarm belt and electric chair seat belt in place and call bell within reach.   Husband arrived at end of session.  Continue OT Plan of Care    Therapy Documentation Precautions:  Precautions Precautions: Fall Precaution Comments: right hemiplegia Restrictions Weight Bearing Restrictions: No Vital Signs:denied   Pain:   Therapy/Group: Individual Therapy  Alfredia Ferguson [facility] 08/16/2019, 11:26 AM

## 2019-08-17 ENCOUNTER — Inpatient Hospital Stay (HOSPITAL_COMMUNITY): Payer: Medicare HMO | Admitting: Occupational Therapy

## 2019-08-17 ENCOUNTER — Inpatient Hospital Stay (HOSPITAL_COMMUNITY): Payer: Medicare HMO | Admitting: Physical Therapy

## 2019-08-17 LAB — HEMOGLOBIN AND HEMATOCRIT, BLOOD
HCT: 33.8 % — ABNORMAL LOW (ref 36.0–46.0)
Hemoglobin: 10.9 g/dL — ABNORMAL LOW (ref 12.0–15.0)

## 2019-08-17 LAB — GLUCOSE, CAPILLARY
Glucose-Capillary: 140 mg/dL — ABNORMAL HIGH (ref 70–99)
Glucose-Capillary: 123 mg/dL — ABNORMAL HIGH (ref 70–99)
Glucose-Capillary: 253 mg/dL — ABNORMAL HIGH (ref 70–99)
Glucose-Capillary: 160 mg/dL — ABNORMAL HIGH (ref 70–99)

## 2019-08-17 NOTE — Progress Notes (Signed)
PROGRESS NOTE    [name]  [id number] DOB: [birth year] DOA: 07/27/2019 PCP: [name], MD   Brief Narrative: [name] is a 79 y.o. femalein Rrehab unit, with PMH of HTN, HLD, DM-2, systolic CHF, ESRD on HD MWF, CAD/CABG in 2010, AS, right thalamic CVA in 2018 and recent hospitalization for left ACA infarct. TRH consulted for concern of sepsis in setting of UTI. Patient treated with antibiotics and now is having BRBPR. GI consulted. CTA significant for diverticulosis and rectal thickening. Aspirin/plavix initially held. Aspirin restarted.  At this time patient's hemoglobin remains quite stable, will sign off at this time, patient may require subsequent transfusions in the future with hemodialysis given chronic anemia of chronic disease however it appears any acute bleeding has resolved given no bright red blood per rectum for over 48 hours at this point.  H&H ordered on Monday and Wednesday with dialysis to ensure no overt drop in hemoglobin.  Otherwise patient appears quite stable, defer to primary team and nephrology for related care.  Assessment & Plan:   Principal Problem:   Small vessel cerebrovascular accident (CVA) ([place]) Active Problems:   Lower GI bleeding   Controlled type 2 diabetes mellitus with hyperglycemia, without long-term current use of insulin (HCC)   Labile blood glucose   Chronic cystitis   Diarrhea due to malabsorption   Lethargy   ESRD on dialysis (HCC)   Bright red blood per rectum, concurrent acute symptomatic anemia, resolving. -Acute GI bleed. Unknown if upper/lower GI bleed (likely lower). -Colonoscopy from 2018 significant for ulcers/erosions in the sigmoid/ascending colon in addition to sigmoid diverticulosis.  -Complicated by aspirin and Plavix use in the setting of recent CVA.  -Plavix discontinued and patient started on Protonix IV - transition to PO -Blood pressure is soft but stable in setting HD and chronic hypotension.  -Patient  received total 4 u PRBC - follow H/H every 48-hour CBC Latest Ref Rng & Units 08/17/2019 08/16/2019 08/15/2019  WBC 4.0 - 10.5 K/uL - 7.8 7.7  Hemoglobin 12.0 - 15.0 g/dL 10.9(L) 11.6(L) 6.9(LL)  Hematocrit 36.0 - 46.0 % 33.8(L) 35.5(L) 22.4(L)  Platelets 150 - 400 K/uL - 122(L) 125(L)  -Questionably diverticular bleed. CTA abdomen/pelvis significant for rectal thickening for which GI thinks is not related to a mass.  -Per radiology read, thickening likely represents source of bleeding.   Sepsis secondary to UTI Not present on admission. Sepsis physiology resolved. Afebrile with no leukocytosis.  UTI Culture significant for Klebsiella pneumonia and enterococcus faecalis. Initially treated with Ceftriaxone, transitioned to Vancomycin and Zosyn and now transitioned to Augmentin. Agree with 5 day total course of treatment since blood culture no growth to date. Symptoms have resolved.  Adynamic ileus Seen on abdominal x-ray. Patient with bowel movements related to below. C. Difficile tested and was negative. Resolved.  Chronic hypotension Stable. -Continue midodrine  History of CVA Currently undergoing rehabilitation. Was discharged on aspirin and Plavix. Plavix held secondary to above. -Continue Crestor and aspirin 81 mg  ESRD -Per nephrology  Chronic systolic heart failure Stable. EF of 25-30%. Volume managed with HD.  Endometrial thickening Concerning for neoplasm vs hyperplasia vs polyps and will need outpatient Gyn follow-up. Discussed with patient and husband at bedside.  Hepatic cirrhosis Splenomegaly Noted on CTA. Associated perihepatic ascites. Will need PCP/GI follow-up. Watch fluid status carefully in setting of this diagnosis, heart failure and ESRD. Discussed with patient and husband.  Diabetes mellitus, type 2 -Continue SSI  Pressure injury Sacrum and right buttock,  POA.   DVT prophylaxis: SCDs Code Status:   Code Status: Full Code Family Communication: None    Antimicrobials:  Ceftriaxone  Vancomycin  Zosyn  Augmentin    Subjective: No acute issues or events overnight, no bloody stools for over 48 hours at this point, hemoglobin stable, otherwise patient continues to participate with PT, she and her husband continue to be hopeful patient will regain function in her arm.  Objective: Vitals:   08/16/19 1730 08/16/19 1800 08/17/19 0455 08/17/19 0500  BP: 94/64 112/62 (!) 101/50   Pulse: 82 76 80   Resp:  16 16   Temp:  98.2 F (36.8 C) 98.6 F (37 C)   TempSrc:  Oral    SpO2:  98% 95%   Weight:    54.6 kg  Height:        Intake/Output Summary (Last 24 hours) at 08/17/2019 0709 Last data filed at 08/17/2019 0542 Gross per 24 hour  Intake 200 ml  Output 2050 ml  Net -1850 ml   Filed Weights   08/16/19 1116 08/16/19 1415 08/17/19 0500  Weight: 53 kg 53 kg 54.6 kg    Examination:  General exam: Appears calm and comfortable Respiratory system: Clear to auscultation. Respiratory effort normal. Cardiovascular system: S1 & S2 heard, RRR. 2/6 systolic murmur. Gastrointestinal system: Abdomen is nondistended, soft and nontender. No organomegaly or masses felt. Normal bowel sounds heard. Central nervous system: Alert and oriented. Extremities: No edema. No calf tenderness  Data Reviewed: I have personally reviewed following labs and imaging studies  CBC: Recent Labs  Lab 08/12/19 0826 08/13/19 0524 08/14/19 0510 08/15/19 0537 08/15/19 1400 08/16/19 1837 08/17/19 0400  WBC 8.9 6.6 7.4  --  7.7 7.8  --   NEUTROABS  --   --   --   --   --  5.7  --   HGB 8.5* 7.2* 7.4* 6.9* 6.9* 11.6* 10.9*  HCT 27.4* 23.3* 23.9* 23.0* 22.4* 35.5* 33.8*  MCV 97.9 98.3 100.0  --  101.8* 93.2  --   PLT 151 133* 124*  --  125* 122*  --    Basic Metabolic Panel: Recent Labs  Lab 08/10/19 1412 08/12/19 1400 08/13/19 0524 08/15/19 1400 08/16/19 1837  NA 131* 132* 136 135 135  K 4.5 4.0 3.7 4.6 3.0*  CL 98 96* 99 99 98  CO2 23 25 26 22  25   GLUCOSE 221* 321* 120* 169* 128*  BUN 85* 61* 31* 100* 19  CREATININE 4.86* 4.37* 2.49* 5.54* 1.82*  CALCIUM 8.2* 8.0* 8.4* 8.3* 8.7*  PHOS 3.3 2.8  --   --  1.5*   GFR: Estimated Creatinine Clearance: 22.3 mL/min (A) (by C-G formula based on SCr of 1.82 mg/dL (H)).  Liver Function Tests: Recent Labs  Lab 08/10/19 1412 08/12/19 1400 08/16/19 1837  ALBUMIN 2.1* 2.2* 2.7*   CBG: Recent Labs  Lab 08/16/19 0602 08/16/19 1205 08/16/19 1822 08/16/19 2056 08/17/19 0604  GLUCAP 111* 145* 114* 309* 140*    Radiology Studies: No results found.  Scheduled Meds: . aspirin  81 mg Oral Daily  . carbamide peroxide  5 drop Both EARS BID  . Chlorhexidine Gluconate Cloth  6 each Topical Q0600  . Chlorhexidine Gluconate Cloth  6 each Topical Q0600  . Chlorhexidine Gluconate Cloth  6 each Topical Q0600  . darbepoetin (ARANESP) injection - DIALYSIS  150 mcg Intravenous Q Thu-HD  . feeding supplement (NEPRO CARB STEADY)  237 mL Oral TID BM  . feeding supplement (  PRO-STAT SUGAR FREE 64)  30 mL Oral BID  . folic acid  1 mg Oral Daily  . Gerhardt's butt cream   Topical QID  . insulin aspart  0-9 Units Subcutaneous TID WC  . miconazole nitrate   Topical BID  . midodrine  10 mg Oral Q M,W,F-HD  . multivitamin  1 tablet Oral QHS  . pantoprazole  40 mg Oral BID  . rosuvastatin  20 mg Oral q1800  . saccharomyces boulardii  250 mg Oral BID  . sevelamer carbonate  1,600 mg Oral TID WC  . vitamin B-12  500 mcg Oral Daily   Continuous Infusions:    LOS: 21 days   Holli Humbles DO [facility] 08/17/2019, 7:09 AM  If 7PM-7AM, please contact night-coverage [email or web address]

## 2019-08-17 NOTE — Progress Notes (Signed)
Occupational Therapy Session Note  Patient Details  Name: [name] MRN: [id number] Date of Birth: 1941/04/26  Today's Date: 08/17/2019 OT Individual Time: 1100-1200 OT Individual Time Calculation (min): 60 min    Short Term Goals: Week 2:  OT Short Term Goal 1 (Week 2): Pt will be able to sit EOB with mod A for balance to prepare for her ability to sit unsupported on a toilet. OT Short Term Goal 1 - Progress (Week 2): Met OT Short Term Goal 2 (Week 2): Pt will be able to use a slide board with mod A of 1 bed to w/c to prepare for her ability to do toilet transfers. OT Short Term Goal 2 - Progress (Week 2): Progressing toward goal OT Short Term Goal 3 (Week 2): Pt will demonstrate improved awareness of RUE with self ROM with min A and min cues. OT Short Term Goal 3 - Progress (Week 2): Met  Skilled Therapeutic Interventions/Progress Updates:    Patient seated in TIS power w/c in reclined position.  Husband present but leaving at this time.  Completed repositioning and weight shift - returned wc to upright position.  Completed reaching and trunk mobility activities with mod A.  Patient able to power w/c on/off and required min A to change seating position.  She is able to drive w/c at slow rate with moderate cues and occ min A due to poor attention to right side and change of direction.  beasy board transfer w/c to bed with max A.  Completed unsupported sitting edge of bed - does well with target for left elbow otherwise loss of balance to the right.  Completed sit to squat position with max A x6.  SSP to supine with max A.  She is able to bridge in bed for lateral mobility.  Completed right shoulder ROM and facilitation of proximal musculature in supine position - able to initiate scapular motion and trace at elbow ext.  Positioned in side lying at close of session, bed alarm set and call bell in reach.    Therapy Documentation Precautions:  Precautions Precautions: Fall Precaution  Comments: right hemiplegia Restrictions Weight Bearing Restrictions: No General:   Vital Signs:  Pain: Pain Assessment Pain Scale: 0-10 Pain Score: 3  Pain Type: Acute pain Pain Location: Buttocks Pain Orientation: Right Pain Descriptors / Indicators: Burning Pain Frequency: Intermittent Pain Onset: With Activity Pain Intervention(s): Repositioned Other Treatments:     Therapy/Group: Individual Therapy  Carlos Levering 08/17/2019, 12:24 PM

## 2019-08-17 NOTE — Progress Notes (Addendum)
Patient ID: [name], female   DOB: 10/04/1940, 79 y.o.   MRN: [id number]  [facility] Progress Note   Assessment/ Plan:   1. 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  Ongoing inpatient OT/PT. 2. ESRD: usual HD is MWF. Next HD Monday.  3. Anemia abl/ ckd: With anemia of chronic kidney disease likely compounded by recent suspected diverticular bleed. Got 2u on 12/24 and another 2u prbc on 12/31. Hb up today to 10. Holding heparin w hd 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 5. Nutrition: Continue renal diet with advancement per GI recommendations.  Continue renal multivitamin/ONS 6. Hypotension/ volume: gets midodrine pre HD 10mg  tiw. No BP lowering meds. Euvolemic on exam. Up 3kg by wts but not sure they are accurate.   Kelly Splinter, MD 08/17/2019, 10:42 AM   Subjective:   No CP or sob.  No abd pain.    Objective:   BP (!) 101/50 (BP Location: Right Arm)   Pulse 80   Temp 98.6 F (37 C)   Resp 16   Ht 5\' 8"  (1.727 m)   Wt 54.6 kg   SpO2 95%   BMI 18.30 kg/m   Physical Exam: Gen: lying in bed CVS: Pulse regular rhythm, normal rate, S1 and S2 normal Resp: Diminished breath sounds over bases otherwise clear to auscultation.  Right IJ TDC. Abd: Soft, flat, nontender Ext: Right leg in supportive brace, no edema  Dialysis OP: MWF NW 4h  300/600   51.5kg   2/2.25 bath   TDC   P4    Heparin 1000 - Mircera 200 q 2 weeks   Medications:    . aspirin  81 mg Oral Daily  . carbamide peroxide  5 drop Both EARS BID  . Chlorhexidine Gluconate Cloth  6 each Topical Q0600  . Chlorhexidine Gluconate Cloth  6 each Topical Q0600  . Chlorhexidine Gluconate Cloth  6 each Topical Q0600  . darbepoetin (ARANESP) injection - DIALYSIS  150 mcg Intravenous Q Thu-HD  . feeding supplement (NEPRO CARB STEADY)  237 mL Oral TID BM  . feeding supplement  (PRO-STAT SUGAR FREE 64)  30 mL Oral BID  . folic acid  1 mg Oral Daily  . Gerhardt's butt cream   Topical QID  . insulin aspart  0-9 Units Subcutaneous TID WC  . miconazole nitrate   Topical BID  . midodrine  10 mg Oral Q M,W,F-HD  . multivitamin  1 tablet Oral QHS  . pantoprazole  40 mg Oral BID  . rosuvastatin  20 mg Oral q1800  . saccharomyces boulardii  250 mg Oral BID  . sevelamer carbonate  1,600 mg Oral TID WC  . vitamin B-12  500 mcg Oral Daily
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
## 2019-08-17 NOTE — Progress Notes (Signed)
Physical Therapy Session Note  Patient Details  Name: [name] MRN: [id number] Date of Birth: [birth year]  Today's Date: 08/17/2019 PT Individual Time: 0801-0904 PT Individual Time Calculation (min): 63 min   Short Term Goals: Week 3:  PT Short Term Goal 1 (Week 3): Pt will perform power w/c mobility x150 ft with supervision PT Short Term Goal 2 (Week 3): pt will use power w/c features in order to perform pressure relief with supervision  Skilled Therapeutic Interventions/Progress Updates:    Pt received supine in bed and agreeable to therapy session. Therapist continues to reinforce pt education regarding importance of rolling schedule for pressure relief as pt reports she has been in supine since 4AM. Pt noted to be incontinent of small BM. Rolling R with min assist and L with mod assist performed total assist peri-care and max/total assist LB clothing management. Pt reports R hip pain during rolling - RN notified and present for medication administration. Supine>sit via logroll technique with HOB slightly elevated and bedrails available with max assist for B LE management and trunk upright - pt able to assist with trunk upright via use of L UE. Sitting EOB pt initially require mod/max assist for trunk control due to R lateral and posterior lean - max cuing for correction. Pt's shirt noted to be damp, sitting EOB with max assist for trunk control therapist donned clean shirt with max assist and max cuing for sequencing to increase pt participation with task. Sitting EOB performed trunk control task of L lateral trunk lean onto L forearm support with pt able to maintain this position with CGA/close supervision for safety then pt able to return to midline using L UE support and hold that position with CGA/close supervision for ~30seconds and then progressed to placing L hand in her lap while still maintaining midline position for ~10seconds prior to having R lateral lean/LOB requiring mod assist for  correction - therapist providing multimodal cuing and manual facilitation throughout for improved midline orientation and improved trunk control strategies. L lateral scoot transfer EOB>w/c using beasy board with +2 max assist for board placement, trunk control, and scooting - pt able to initiate using L UE to pull herself across on the beasy board. Pt able to recall how to turn on/off power chair without cuing and how to tilt chair with min cuing. Pt continues to require max/dependent assist for power w/c navigation in/out of room. Performed ~162ft power w/c mobility to therapy gym with mod assist for directing chair as pt continues to veer right due to R inattention and poor sustained attention on the task - max multimodal cuing throughout for improved attention to task and improved navigation of chair. Therapist lengthened B LE leg rests for improved fit to decrease weightbearing in sacrum while pt sitting in w/c. Therapist performed dependent w/c transport back to room in power chair. Pt agreeable to remain sitting up in power chair - performed tilt backwards for pressure relief with min cuing. Pt left seated in power w/c with needs in reach and seat belt on.  Therapy Documentation Precautions:  Precautions Precautions: Fall Precaution Comments: right hemiplegia Restrictions Weight Bearing Restrictions: No  Pain:   Reports R hip pain - RN notified and present for medication administration - therapist provided rest breaks and repositioning for pain management.    Therapy/Group: Individual Therapy  Tawana Scale, PT, DPT 08/17/2019, 7:42 AM

## 2019-08-18 ENCOUNTER — Ambulatory Visit (HOSPITAL_COMMUNITY): Payer: Medicare HMO | Admitting: Physical Therapy

## 2019-08-18 ENCOUNTER — Inpatient Hospital Stay (HOSPITAL_COMMUNITY): Payer: Medicare HMO

## 2019-08-18 LAB — GLUCOSE, CAPILLARY
Glucose-Capillary: 119 mg/dL — ABNORMAL HIGH (ref 70–99)
Glucose-Capillary: 166 mg/dL — ABNORMAL HIGH (ref 70–99)
Glucose-Capillary: 176 mg/dL — ABNORMAL HIGH (ref 70–99)

## 2019-08-18 MED ORDER — CHLORHEXIDINE GLUCONATE CLOTH 2 % EX PADS
6.0000 | MEDICATED_PAD | Freq: Every day | CUTANEOUS | Status: DC
  Administered 2019-08-18 – 2019-08-24 (×6): 6 via TOPICAL
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
## 2019-08-18 NOTE — Progress Notes (Signed)
Occupational Therapy Session Note  Patient Details  Name: [name] MRN: [id number] Date of Birth: [birth year]  Today's Date: 08/18/2019 OT Individual Time: 5697-9480 OT Individual Time Calculation (min): 60 min    Short Term Goals: Week 1:  OT Short Term Goal 1 (Week 1): Pt will maintain sitting balance with min assist to prepare for ADL tasks OT Short Term Goal 1 - Progress (Week 1): Not met OT Short Term Goal 2 (Week 1): Pt will complete UB bathing with min assist OT Short Term Goal 2 - Progress (Week 1): Progressing toward goal OT Short Term Goal 3 (Week 1): Pt will complete LB bathing with max assist of 1 caregiver OT Short Term Goal 3 - Progress (Week 1): Not met OT Short Term Goal 4 (Week 1): Pt will complete toilet transfer with max assist of one caregiver OT Short Term Goal 4 - Progress (Week 1): Not met  Skilled Therapeutic Interventions/Progress Updates:    1;1. Pt received in bed agreeable to OT. Pt with no pain reported. Pt completes rolling with S to R and MAX A to L to doff paper pants and don new pants. Pt completes beazy board transfer with MAX A of 1 +2 present to place board. Pt completes UB dressing seated in PWC at sink with MOD A overall and max VC for hemi dressing. Pt manuevers w/c with MAX VC for R attention and total A to setup for transfer to mat. Pt sit to stand from EOM with MAX A of 1 and max manual facilitation of terminal hip extension and midline orientation with mirror for visual feedback. Initially pt able to sit EOM with S overall for midline orientation/balance however increasing to total A with fatigue after standing. Exited session with pt seated in PWC, tilted back for pressure relief on buttocks and call light in reach. Husband present in room and NT aware of positioning  Therapy Documentation Precautions:  Precautions Precautions: Fall Precaution Comments: right hemiplegia Restrictions Weight Bearing Restrictions: No General:   Vital  Signs: Therapy Vitals Pulse Rate: 83 BP: 126/71 Patient Position (if appropriate): Sitting Pain: Pain Assessment Pain Scale: 0-10 Pain Score: 4  Pain Type: Acute pain Pain Location: Neck Pain Orientation: Right;Posterior Pain Frequency: Intermittent Pain Onset: Gradual Pain Intervention(s): Medication (See eMAR) ADL: ADL Eating: Set up Where Assessed-Eating: Chair Upper Body Bathing: Moderate assistance Where Assessed-Upper Body Bathing: Edge of bed Lower Body Bathing: Dependent Where Assessed-Lower Body Bathing: Bed level Upper Body Dressing: Maximal assistance Where Assessed-Upper Body Dressing: Edge of bed Lower Body Dressing: Dependent(+2) Where Assessed-Lower Body Dressing: Edge of bed Toilet Transfer: Maximal assistance(+2) Toilet Transfer Method: Stand pivot Science writer: Drop arm bedside commode Vision   Perception    Praxis   Exercises:   Other Treatments:     Therapy/Group: Individual Therapy  Tonny Branch 08/18/2019, 10:46 AM

## 2019-08-18 NOTE — Progress Notes (Signed)
Physical Therapy Weekly Progress Note  Patient Details  Name: [name] MRN: [id number] Date of Birth: [birth year]  Beginning of progress report period: August 12, 2019 End of progress report period: August 18, 2019  Today's Date: 08/18/2019 [name] Individual Time: 7322-0254 [name] Individual Time Calculation (min): 56 min   Patient has met 1 of 2 short term goals. [name] continues to demonstrate slow progress with therapy. She can perform rolling R in bed with min assist and L with mod assist and can perform supine<>sit with with max assist of 1 demonstrating improved ability to assist with trunk upright using L UE. She is able to maintain static sitting balance EOB with CGA for steadying using L UE support for ~30 seconds with cuing but continues to require max assist for dynamic sitting balance. She can perform beasy board transfers to/from w/c with max assist of 1 and +2 assist for board placement with [name] demonstrating increased ability to help via pushing through L UE and LE. [name] received power w/c evaluation to determine best fit for increased [name] independence and has progressed to ~274f power w/c mobility though requires min assist at this time to avoid obstacles on R due to R inattention, poor sustained attention on task, and poor motor planning for correction of errors when driving. [name] is able to perform pressure relief in power w/c via power tilt feature with supervision. Have continued family education/training for hoyer lift transfers in preparation for D/C home.   Patient continues to demonstrate the following deficits muscle weakness, muscle joint tightness and muscle paralysis, decreased cardiorespiratoy endurance, impaired timing and sequencing, abnormal tone, unbalanced muscle activation, decreased coordination and decreased motor planning, decreased midline orientation and decreased attention to right and decreased sitting balance, decreased standing balance, decreased postural control  and decreased balance strategies and therefore will continue to benefit from skilled [name] intervention to increase functional independence with mobility.  Patient progressing toward long term goals..  Continue plan of care.   [name] Short Term Goals Week 3:  [name] Short Term Goal 1 (Week 3): [name] will perform power w/c mobility x150 ft with supervision [name] Short Term Goal 1 - Progress (Week 3): Not met [name] Short Term Goal 2 (Week 3): [name] will use power w/c features in order to perform pressure relief with supervision [name] Short Term Goal 2 - Progress (Week 3): Met Week 4:  [name] Short Term Goal 1 (Week 4): = to LTGs based on ELOS  Skilled Therapeutic Interventions/Progress Updates:  Ambulation/gait training;Balance/vestibular training;Cognitive remediation/compensation;Community reintegration;Discharge planning;Disease management/prevention;DME/adaptive equipment instruction;Functional mobility training;Functional electrical stimulation;Neuromuscular re-education;Pain management;Patient/family education;Psychosocial support;Skin care/wound management;Splinting/orthotics;Stair training;Therapeutic Activities;Therapeutic Exercise;UE/LE Strength taining/ROM;UE/LE Coordination activities;Visual/perceptual remediation/compensation;Wheelchair propulsion/positioning   [name] received sitting in power w/c and reports that her son was unable to make it for family education/training today as he has a stomach bug. [name] agreeable to therapy session. Therapist called [name]'s son and discussed plans to reschedule hands-on family training and [name]'s son agreeable to come on Tuesday 1/5 at 1pm; however his wife will be unable to attend due to having chemo at that time. Therapist discussed with [name]'s son her C65with power w/c mobility requiring increased assistance due to R inattention and poor sustained attention to task as well as poor correctional planning to navigate around obstacles - will continue to work on power w/c mobility and update  family on [name]'s progress. [name]'s son reports that he has purchased a power w/c accessible vLucianne Leiand already knows who he will arrange for day time  assistance but was wondering if [name]'s husband could assist [name] at night - therapist educated [name]'s son on [name]'s husband's limited ability to assist due to his own mobility impairments, [name]'s son verbalizes understanding. [name] able to recall how to use power tilt function of w/c for pressure relief without cuing. Performed ~255f power w/c mobility focusing on increased independence with [name] able to navigate w/c out of hospital room with min cuing but no assistance and able to navigate around straight hallways and through dayroom with mod assist for navigating doorway on L but otherwise only intermittent min assist - requires max assist for navigating w/c back into hospital room due to R inattention and [name] about to hit the sink - overall [name] demonstrating improving ability to navigate out in wide hallways with less cuing but continues to have significant difficulty in more household type setting. [name] requesting to return to bed. R lateral scoot transfer w/c>EOB using beasy board with +2 assist for board placement but then mod/max assist for scooting across the board with [name] demonstrating increased ability to use L UE and trunk movements to assist with scooting. Sit>supine with mod assist for R hemibody management. [name] agreeable to remain L sidelying for pressure relief - therapeutically positioned with pillows and left with needs in reach and bed alarm on.  Therapy Documentation Precautions:  Precautions Precautions: Fall Precaution Comments: right hemiplegia Restrictions Weight Bearing Restrictions: No  Pain: Reports some pain on her bottom when +2 assist placing beasy board for transfer - repositioned for pain management.   Therapy/Group: Individual Therapy  CTawana Scale [name], DPT 08/18/2019, 8:00 AM

## 2019-08-18 NOTE — Progress Notes (Signed)
Patient ID: ARMANDINA BOEREMA, female   DOB: [birth year], 79 y.o.   MRN: [id number]   [facility] Progress Note   Assessment/ Plan:   1. Status post left ACA CVA: with debility and R hemiparesis. CVA presumed to be embolic with negative work-up.  On CIR.  2. ESRD: usual HD is MWF. Next HD Monday, shortened Rx d/t pt volumes  3. Anemia abl/ ckd: With anemia of chronic kidney disease likely compounded by recent suspected diverticular bleed. Got 2u on 12/24 and another 2u prbc on 12/31. Hb up today to 10 on 1/2. Holding heparin w hd.  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 5. Nutrition: Continue renal diet with advancement per GI recommendations.  Continue renal multivitamin/ONS 6. Hypotension/ volume: gets midodrine pre HD 10mg  tiw. No BP lowering meds. Euvolemic on exam. Up 3kg by wts but not sure they are accurate.   Kelly Splinter, MD 08/18/2019, 9:45 AM   Subjective:   No CP or sob.  No abd pain.    Objective:   BP 126/71 (BP Location: Right Arm)   Pulse 83   Temp 98.1 F (36.7 C) (Oral)   Resp 18   Ht 5\' 8"  (1.727 m)   Wt 55.4 kg   SpO2 100%   BMI 18.57 kg/m   Physical Exam: Gen: lying in bed CVS: Pulse regular rhythm, normal rate, S1 and S2 normal Resp: Diminished breath sounds over bases otherwise clear to auscultation.  Right IJ TDC. Abd: Soft, flat, nontender Ext: Right leg in supportive brace, no edema  Dialysis OP: MWF NW 4h  300/600   51.5kg   2/2.25 bath   TDC   P4    Heparin 1000 - Mircera 200 q 2 weeks   Medications:    . aspirin  81 mg Oral Daily  . carbamide peroxide  5 drop Both EARS BID  . Chlorhexidine Gluconate Cloth  6 each Topical Q0600  . Chlorhexidine Gluconate Cloth  6 each Topical Q0600  . Chlorhexidine Gluconate Cloth  6 each Topical Q0600  . darbepoetin (ARANESP) injection - DIALYSIS  150 mcg Intravenous Q Thu-HD  . feeding supplement (NEPRO  CARB STEADY)  237 mL Oral TID BM  . feeding supplement (PRO-STAT SUGAR FREE 64)  30 mL Oral BID  . folic acid  1 mg Oral Daily  . Gerhardt's butt cream   Topical QID  . insulin aspart  0-9 Units Subcutaneous TID WC  . miconazole nitrate   Topical BID  . midodrine  10 mg Oral Q M,W,F-HD  . multivitamin  1 tablet Oral QHS  . pantoprazole  40 mg Oral BID  . rosuvastatin  20 mg Oral q1800  . saccharomyces boulardii  250 mg Oral BID  . sevelamer carbonate  1,600 mg Oral TID WC  . vitamin B-12  500 mcg Oral Daily

## 2019-08-19 ENCOUNTER — Inpatient Hospital Stay (HOSPITAL_COMMUNITY): Payer: Medicare HMO | Admitting: Speech Pathology

## 2019-08-19 ENCOUNTER — Inpatient Hospital Stay (HOSPITAL_COMMUNITY): Payer: Medicare HMO | Admitting: Occupational Therapy

## 2019-08-19 ENCOUNTER — Inpatient Hospital Stay (HOSPITAL_COMMUNITY): Payer: Medicare HMO

## 2019-08-19 LAB — COMPREHENSIVE METABOLIC PANEL
ALT: 20 U/L (ref 0–44)
AST: 23 U/L (ref 15–41)
Albumin: 2.6 g/dL — ABNORMAL LOW (ref 3.5–5.0)
Alkaline Phosphatase: 74 U/L (ref 38–126)
Anion gap: 15 (ref 5–15)
BUN: 93 mg/dL — ABNORMAL HIGH (ref 8–23)
CO2: 22 mmol/L (ref 22–32)
Calcium: 9 mg/dL (ref 8.9–10.3)
Chloride: 96 mmol/L — ABNORMAL LOW (ref 98–111)
Creatinine, Ser: 5.49 mg/dL — ABNORMAL HIGH (ref 0.44–1.00)
GFR calc Af Amer: 8 mL/min — ABNORMAL LOW (ref >60)
GFR calc non Af Amer: 7 mL/min — ABNORMAL LOW (ref >60)
Glucose, Bld: 127 mg/dL — ABNORMAL HIGH (ref 70–99)
Potassium: 4.3 mmol/L (ref 3.5–5.1)
Sodium: 133 mmol/L — ABNORMAL LOW (ref 135–145)
Total Bilirubin: 0.6 mg/dL (ref 0.3–1.2)
Total Protein: 6.2 g/dL — ABNORMAL LOW (ref 6.5–8.1)

## 2019-08-19 LAB — GLUCOSE, CAPILLARY
Glucose-Capillary: 134 mg/dL — ABNORMAL HIGH (ref 70–99)
Glucose-Capillary: 189 mg/dL — ABNORMAL HIGH (ref 70–99)
Glucose-Capillary: 88 mg/dL (ref 70–99)
Glucose-Capillary: 182 mg/dL — ABNORMAL HIGH (ref 70–99)

## 2019-08-19 LAB — HEMOGLOBIN AND HEMATOCRIT, BLOOD
HCT: 35.2 % — ABNORMAL LOW (ref 36.0–46.0)
Hemoglobin: 11.1 g/dL — ABNORMAL LOW (ref 12.0–15.0)

## 2019-08-19 MED ORDER — HEPARIN SODIUM (PORCINE) 1000 UNIT/ML IJ SOLN
INTRAMUSCULAR | Status: AC
  Administered 2019-08-19: 1000 [IU]
  Filled 2019-08-19: qty 4

## 2019-08-19 NOTE — Progress Notes (Signed)
Physical Therapy Session Note  Patient Details  Name: [name] MRN: [id number] Date of Birth: 10/09/1940  Today's Date: 08/19/2019 PT Individual Time: 1030-1100 PT Individual Time Calculation (min): 30 min   Short Term Goals:  Week 4:  PT Short Term Goal 1 (Week 4): = to LTGs based on ELOS  Skilled Therapeutic Interventions/Progress Updates:  Pt seated in power w/c, slightly tilted back.  She reported that her "tail bone" hurt, unrated.  Discussed that tilting back in w/c will relieve her pain, but pt had not initiated that until PT arrived.  W/c propulsion out of room, in hallway, requiring multiple turns.  Pt required max cues and frequent min assist to avoid obstacles in busy hallway.  PT taped brightly colored 3x5 card above joy stick, stating "LOOK to the RIGHT" as a reminder to pt.  Her resting head position is in slight L rotation, exacerbating R inattention.  At end of session, pt in w/c.  She used controls to tilt the chair back until she no longer had pain in coccyx.  Needs left at hand.      Therapy Documentation Precautions:  Precautions Precautions: Fall Precaution Comments: right hemiplegia Restrictions Weight Bearing Restrictions: No      Therapy/Group: Individual Therapy  Milanna Kozlov 08/19/2019, 12:17 PM

## 2019-08-19 NOTE — Progress Notes (Signed)
Occupational Therapy Session Note  Patient Details  Name: SHARNEE CARNEY MRN: [id number] Date of Birth: Dec 05, 1940  Today's Date: 08/19/2019 OT Individual Time: [id number] OT Individual Time Calculation (min): 62 min    Short Term Goals: Week 3:  OT Short Term Goal 1 (Week 3): FAmily education to be completed with hoyer lift OT Short Term Goal 2 (Week 3): Slide board transfer bed to chair with therapy only with mod A +1  Skilled Therapeutic Interventions/Progress Updates:  Pt received in bed ready for therapy. She declined changing clothing as she felt her clothes were still clean from yesterday. From bed worked on bridging and rolling techniques for her spouse to use with her at night. Rolling to L using LUE to support RUE at elbow with arm across her chest, B knees bent rolling with min A, S to R. Pt continues to have some dizziness with rolling so she needs to move slowly.   Incontinent of bowel x 3. Therapist cleansed pt 2x and then Demonstrated with repeat demonstration to spouse how to assist her with rolling to her L, how to position legs to allow for easy cleansing, doffing and donning of brief and bridging to doff and don pants over hips. Spouse practiced assisting her with rolling onto L to place brief and then with bridging (by stabilizing R leg) for pulling pants on.    Pt sat to EOB with max A and then beezy board placed by 2nd helper. Pt then moved to wc with max A of 1.  Pt set up in power wc with belt on.      RUE PROM with some active finger flexion. Tight wrist flexors, can stretch wrist to extension., placed hand splint on and educated pt and spouse the importance of using splint especially at night.  RUE supported by pillow.   Pt in chair with spouse in the room.   Therapy Documentation Precautions:  Precautions Precautions: Fall Precaution Comments: right hemiplegia Restrictions Weight Bearing Restrictions: No    Pain:  Pain Assessment Pain Score: 0-No  pain ADL: ADL Eating: Set up Where Assessed-Eating: Chair Upper Body Bathing: Moderate assistance Where Assessed-Upper Body Bathing: Edge of bed Lower Body Bathing: Dependent Where Assessed-Lower Body Bathing: Bed level Upper Body Dressing: Maximal assistance Where Assessed-Upper Body Dressing: Edge of bed Lower Body Dressing: Dependent(+2) Where Assessed-Lower Body Dressing: Edge of bed Toilet Transfer: Maximal assistance(+2) Toilet Transfer Method: Stand pivot Toilet Transfer Equipment: Drop arm bedside commode   Therapy/Group: Individual Therapy  [place] 08/19/2019, 9:07 AM

## 2019-08-19 NOTE — Progress Notes (Signed)
Nutrition Follow-up  RD working remotely.  DOCUMENTATION CODES:   Underweight, suspect pt with some degree of malnutrition but unable to confirm at this time without NFPE  INTERVENTION:   - Continue with liberalized Regular diet order  - Continue rena-vit daily  - Pro-stat 30 ml poBID, each supplement provides 100 kcal and 15 grams of protein  - Nepro Shake po TID, each supplement provides 425 kcal and 19 grams protein  NUTRITION DIAGNOSIS:   Increased nutrient needs related to chronic illness (ESRD on HD, CHF) as evidenced by estimated needs.  Ongoing, being addressed via supplements  GOAL:   Patient will meet greater than or equal to 90% of their needs  Progressing  MONITOR:   PO intake, Supplement acceptance, Labs, Weight trends, Skin, I & O's  REASON FOR ASSESSMENT:   Other (low BMI)    ASSESSMENT:   79 year old female with PMH of HTN, HLD, DM, history of GI bleed, IBS, CHF, ESRD on HD, CAD s/p CABG 2010, aortic stenosis, history of right thalamic CVA 2018, tobacco abuse. Presented 07/23/19 with right-sided weakness. Chronic small vessel ischemia and generalized volume loss noted. CT angiogram negative for large vessel occlusion however positive for significant intracranial and extracranial atherosclerotic disease. MRI of the brain revealing areas of acute infarction throughout the distal left anterior cerebral artery territory. Pt admitted to CIR on 12/12.  12/18 - s/p TEE 12/20 - KUB suggestive of ileus, clear liquid diet 12/22 - full liquid diet 12/23 - regular textures  Noted plan for d/c on 08/20/19.  Pt accepting most Nepro shakes per [facility] documentation.  Pt due for HD today. Last HD on 08/16/19 with 1500 ml net UF.  Current weight is 2 lbs above admit weight. Per RN edema assessment, pt with non-pitting edema to RUE and BLE.  Meal Completion: 50-100% x last 6 meals  Medications reviewed and include: Nepro TID, Pro-stat BID, folic acid, SSI, rena-vit,  protonix, Florastor, vitamin B-12  Labs reviewed: sodium 133, chloride 96 CBG's: 134-189 x 24 hours  UOP: 750 ml x 24 hours  NUTRITION - FOCUSED PHYSICAL EXAM:  Unable to complete at this time. RD working remotely.  Diet Order:   Diet Order            Diet regular Room service appropriate? Yes; Fluid consistency: Thin; Fluid restriction: Other (see comments)  Diet effective now              EDUCATION NEEDS:   No education needs have been identified at this time  Skin:  Skin Assessment: Skin Integrity Issues: Stage II: right buttocks  Last BM:  08/19/19  Height:   Ht Readings from Last 1 Encounters:  07/27/19 5\' 8"  (1.727 m)    Weight:   Wt Readings from Last 1 Encounters:  08/19/19 53.7 kg    Ideal Body Weight:  63.6 kg  BMI:  Body mass index is 18 kg/m.  Estimated Nutritional Needs:   Kcal:  1450-1650  Protein:  70-85 grams  Fluid:  UOP + 1000 ml    Gaynell Face, MS, RD, LDN Inpatient Clinical Dietitian Pager: [phone] Weekend/After Hours: [phone]

## 2019-08-19 NOTE — Progress Notes (Signed)
Patient off unit for dialysis.

## 2019-08-19 NOTE — Progress Notes (Addendum)
Subjective:  Sitting in wheelchair  , husband in room  For hd today/No sob   Objective Vital signs in last 24 hours: Vitals:   08/18/19 1529 08/18/19 2134 08/19/19 0336 08/19/19 0600  BP: (!) 96/59 128/64 126/64   Pulse: 80 79 81   Resp: 17 17 16    Temp: 98 F (36.7 C) 98.1 F (36.7 C) 98.2 F (36.8 C)   TempSrc:      SpO2: 97% 98% 96%   Weight:    53.7 kg  Height:       Weight change: -1.7 kg  Physical Exam: Gen:  Alert, Ox3 , Sitting in wheelchair  NAD  CVS: Pulse regular rhythm, normal rate, S1 and S2 normal Resp: Diminished breath sounds over bases otherwise clear to auscultation.   Abd: Soft, flat, nontender Extremities:  Ext: Right leg in supportive brace, no edema Dialysis Access: R IJ Perm cath  Dialysis OP:MWF NW 4h  300/600   51.5kg   2/2.25 bath   TDC   P4    Heparin 1000 - Mircera 200 q 2 weeks  Problem/Plan: 1. Status post left ACA CVA: with debility and R hemiparesis. CVA presumed to be embolic with negative work-up.  On CIR.  2. ESRD: usual HD is MWF. Next HD today , shortened Rx d/t pt volumes / Has Foley in and  Dose not use at home  Will need to dc before dc unless incontinent 2/2 cva prevent skin breakdown ?? 3. Anemia abl/ ckd: With anemia of chronic kidney disease likely compounded by recent suspected diverticular bleed. Got 2u on 12/24 and another 2u prbc on 12/31. Hb up now  today to 10> 11.1 today  Holding heparin w hd.  4. CKD-MBD: Calcium up with low alb and phos 1.5  Will hold Binder = sevelamer and off vit d Hect. With Ca corec >10  5. Nutrition:  Alb 2.6  Now on regular diet  , monitor labs  But low phos and k ok /Continue renal diet with advancement per GI recommendations.  Continue renal multivitamin/ONS 6. Hypotension/ volume: gets midodrine pre HD 10mg  tiw. No BP lowering meds. Euvolemic on exam. Up 3kg by wts but not  Accurate /  Ernest Haber, PA-C [facility] Beeper [phone] 08/19/2019,8:54 AM  LOS: 23 days    Labs: Basic Metabolic Panel: Recent Labs  Lab 08/12/19 1400 08/15/19 1400 08/16/19 1837 08/19/19 0553  NA 132* 135 135 133*  K 4.0 4.6 3.0* 4.3  CL 96* 99 98 96*  CO2 25 22 25 22   GLUCOSE 321* 169* 128* 127*  BUN 61* 100* 19 93*  CREATININE 4.37* 5.54* 1.82* 5.49*  CALCIUM 8.0* 8.3* 8.7* 9.0  PHOS 2.8  --  1.5*  --    Liver Function Tests: Recent Labs  Lab 08/12/19 1400 08/16/19 1837 08/19/19 0553  AST  --   --  23  ALT  --   --  20  ALKPHOS  --   --  74  BILITOT  --   --  0.6  PROT  --   --  6.2*  ALBUMIN 2.2* 2.7* 2.6*   No results for input(s): LIPASE, AMYLASE in the last 168 hours. No results for input(s): AMMONIA in the last 168 hours. CBC: Recent Labs  Lab 08/13/19 0524 08/14/19 0510 08/15/19 1400 08/16/19 1837 08/17/19 0400 08/19/19 0553  WBC 6.6 7.4 7.7 7.8  --   --   NEUTROABS  --   --   --  5.7  --   --  HGB 7.2* 7.4* 6.9* 11.6* 10.9* 11.1*  HCT 23.3* 23.9* 22.4* 35.5* 33.8* 35.2*  MCV 98.3 100.0 101.8* 93.2  --   --   PLT 133* 124* 125* 122*  --   --    Cardiac Enzymes: No results for input(s): CKTOTAL, CKMB, CKMBINDEX, TROPONINI in the last 168 hours. CBG: Recent Labs  Lab 08/17/19 2130 08/18/19 0633 08/18/19 1151 08/18/19 1616 08/19/19 0622  GLUCAP 160* 119* 166* 176* 134*    Studies/Results: No results found. Medications:  . aspirin  81 mg Oral Daily  . carbamide peroxide  5 drop Both EARS BID  . Chlorhexidine Gluconate Cloth  6 each Topical Q0600  . darbepoetin (ARANESP) injection - DIALYSIS  150 mcg Intravenous Q Thu-HD  . feeding supplement (NEPRO CARB STEADY)  237 mL Oral TID BM  . feeding supplement (PRO-STAT SUGAR FREE 64)  30 mL Oral BID  . folic acid  1 mg Oral Daily  . Gerhardt's butt cream   Topical QID  . insulin aspart  0-9 Units Subcutaneous TID WC  . miconazole nitrate   Topical BID  . midodrine  10 mg Oral Q M,W,F-HD  . multivitamin  1 tablet Oral QHS  . pantoprazole  40 mg Oral BID  . rosuvastatin  20 mg  Oral q1800  . saccharomyces boulardii  250 mg Oral BID  . sevelamer carbonate  1,600 mg Oral TID WC  . vitamin B-12  500 mcg Oral Daily    Nephrology attending: Patient was seen and examined at bedside.  Agree with assessment and plan as outlined above. Patient with ESRD, had a stroke and is now undergoing inpatient rehab.  Receiving dialysis Monday Wednesday Friday therefore plan for next HD today.  Doing short treatment sessions because of many patients requiring dialysis.  Hemoglobin at goal.  Agree with holding binders because of low phosphorus.  Lawson Radar, MD [facility].

## 2019-08-19 NOTE — Progress Notes (Signed)
[name] PHYSICAL MEDICINE & REHABILITATION PROGRESS NOTE   Subjective/Complaints:   Some loose stools and perianal pain . No abd pain or cramping Discussed with pt and husband about palliative consult, she will think about this    ROS: Patient deniesCP, SOB, N/V/D   Objective:   No results found. Recent Labs    08/16/19 1837 08/17/19 0400 08/19/19 0553  WBC 7.8  --   --   HGB 11.6* 10.9* 11.1*  HCT 35.5* 33.8* 35.2*  PLT 122*  --   --    Recent Labs    08/16/19 1837 08/19/19 0553  NA 135 133*  K 3.0* 4.3  CL 98 96*  CO2 25 22  GLUCOSE 128* 127*  BUN 19 93*  CREATININE 1.82* 5.49*  CALCIUM 8.7* 9.0    Intake/Output Summary (Last 24 hours) at 08/19/2019 1001 Last data filed at 08/19/2019 0343 Gross per 24 hour  Intake 600 ml  Output 750 ml  Net -150 ml     Physical Exam: Vital Signs Blood pressure 126/64, pulse 81, temperature 98.2 F (36.8 C), resp. rate 16, height 5\' 8"  (1.727 m), weight 53.7 kg, SpO2 96 %. Constitutional: No distress . Vital signs reviewed. Husband at bedside; pt in bed HEENT: EOMI, oral membranes moist Neck: supple Cardiovascular: RRR with murmur. No JVD    Respiratory: CTA Bilaterally without wheezes or rales. Normal effort    GI: BS +, non-tender, non-distended  Rectal- not visualized (in w/c) Skin: Warm and dry.  Intact. Psych: Normal mood.  Normal behavior. Musc: No edema in extremities.  No tenderness in extremities. Neurological: Alert Motor: RUE/RLE: 0/5 proximal distal,trace finger flexion  LUE: 4/5 proximal to distal LLE: 4-/5 proximal to distal  Assessment/Plan: 1. 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  Physiatrist is providing close team supervision and 24 hour management of active medical problems listed below.  Physiatrist and rehab team continue to assess barriers to discharge/monitor patient  progress toward functional and medical goals  Care Tool:  Bathing    Body parts bathed by patient: Chest, Abdomen, Front perineal area, Right upper leg, Left upper leg, Face   Body parts bathed by helper: Buttocks, Right lower leg, Left lower leg, Right arm, Left arm Body parts n/a: Front perineal area, Buttocks, Right upper leg, Left lower leg, Right lower leg, Left upper leg(did not attempt)   Bathing assist Assist Level: Moderate Assistance - Patient 50 - 74%     Upper Body Dressing/Undressing Upper body dressing   What is the patient wearing?: Pull over shirt    Upper body assist Assist Level: Moderate Assistance - Patient 50 - 74%    Lower Body Dressing/Undressing Lower body dressing      What is the patient wearing?: Incontinence brief, Pants     Lower body assist Assist for lower body dressing: Maximal Assistance - Patient 25 - 49%     Toileting Toileting    Toileting assist Assist for toileting: Dependent - Patient 0%     Transfers Chair/bed transfer  Transfers assist     Chair/bed transfer assist level: 2 Helpers     Locomotion Ambulation   Ambulation assist   Ambulation activity did not occur: Safety/medical concerns(poor postural control and decreased standing ability)          Walk 10 feet activity   Assist  Walk 10 feet activity did not occur: Safety/medical concerns  Walk 50 feet activity   Assist Walk 50 feet with 2 turns activity did not occur: Safety/medical concerns         Walk 150 feet activity   Assist Walk 150 feet activity did not occur: Safety/medical concerns         Walk 10 feet on uneven surface  activity   Assist Walk 10 feet on uneven surfaces activity did not occur: Safety/medical concerns         Wheelchair     Assist Will patient use wheelchair at discharge?: Yes Type of Wheelchair: Power    Wheelchair assist level: Minimal Assistance - Patient > 75% Max wheelchair distance:  262ft    Wheelchair 50 feet with 2 turns activity    Assist        Assist Level: Minimal Assistance - Patient > 75%   Wheelchair 150 feet activity     Assist      Assist Level: Minimal Assistance - Patient > 75%   Blood pressure 126/64, pulse 81, temperature 98.2 F (36.8 C), resp. rate 16, height 5\' 8"  (1.727 m), weight 53.7 kg, SpO2 96 %.  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 Tent d/c 1/9  -consider palliative care consult given multiple co-morbidities/complications below 2. Antithrombotics: -DVT/anticoagulation:SCDs -antiplatelet therapy: Aspirin 81 mg daily and Plavix 75 mg daily x3 weeks then aspirin alone 3. Pain Management:Tylenol as needed 4. Mood:Xanax 1 mg nightly held for lethargy which has improved  - resumed at bedtime prn at lower dose  -antipsychotic agents: N/A 5. Neuropsych: This patientiscapable of making decisions on herown behalf. 6. Skin/Wound Care:Routine skin checks -local skin care for abrasions -nutrition discussed  -benadryl prn for itching  -sarna lotion prn 7. Fluids/Electrolytes/Nutrition:Routine and outs 8.  ESRD. Hemodialysis per renal services. HD after therapy day to allow for better therapy participation 9. Orthostasis. Continue ProAmatine 10 mg Monday Wednesday Friday   Vitals:   08/18/19 2134 08/19/19 0336  BP: 128/64 126/64  Pulse: 79 81  Resp: 17 16  Temp: 98.1 F (36.7 C) 98.2 F (36.8 C)  SpO2: 98% 96%  controlled 12/28 10. Hyperlipidemia. Crestor 11. Acute on chronic combined congestive heart failure. Monitor for any signs of fluid overload. Daily weights  TEE suggesting EF of 35% with mild MR, TR, AS Filed Weights   08/17/19 0500 08/18/19 0500 08/19/19 0600  Weight: 54.6 kg 55.4 kg 53.7 kg    Fluid balance as per  Nephro 12. Diabetes mellitus type II with hyperglycemia, hemoglobin A1c 5.3.   CBG (last 3)  Recent Labs    08/18/19 1151 08/18/19 1616 08/19/19 0622  GLUCAP 166* 176* 134*  controlled   13. History of tobacco abuse. Counseling 14. CAD with CABG 2010. Continue aspirin. No chest pain reported. 15. IBS/history of GI bleed. hold Amitiza24 mcg daily - due to diarrhea  16.  Social son threatened to break through security to visit his mom  91.  Chronic cystitis  Seen by urology, on no abx at present 18.  Diarrhea- C diff neg,was on  Amitiza took this every other day at home to avoid diarrhea   -being held for now  Prn antidiarrheal 19.  Vaginal d/c: appreciate GYN eval- endometrial mass will need f/u  20. Fever x 1 with leukocytosis, off vanc and Zosyn,and  augmentin --afebrile   Xanax DC'd- appears brighter but sleep is altered will restart low dose prn  at night   Fluid status per Nephro given HD   Patient oliguric-ESRD    Chest x-ray ordered- no evidence of infiltrate 21.  ABLA GI bleed hx of same (diverticular per GI), Hgb responded to 2 U PRBC , appreciate GI consult --outpt f/u with Dr. Collene Mares   Hgb stable at 7.2 no orthostasic symptoms  1/1: Received 2U PRBC       LOS: 23 days A FACE TO [place] E [name] 08/19/2019, 10:01 AM

## 2019-08-19 NOTE — Progress Notes (Signed)
Speech Language Pathology Weekly Progress and Session Note  Patient Details  Name: [name] MRN: [id number] Date of Birth: 03-28-1941  Beginning of progress report period: August 13, 2019 End of progress report period: August 18, 2018  Today's Date: 08/19/2019 SLP Individual Time: 0800-0830 SLP Individual Time Calculation (min): 30 min  Short Term Goals: Week 3: SLP Short Term Goal 1 (Week 3): Pt will use external aids to recall daily information with Supervision assist verbal cues. SLP Short Term Goal 1 - Progress (Week 3): Progressing toward goal SLP Short Term Goal 2 (Week 3): Pt will complete mildly complex tasks with supervision assist verbal cues for functional problem solving. SLP Short Term Goal 2 - Progress (Week 3): Progressing toward goal SLP Short Term Goal 3 (Week 3): Pt will locate items to right of midline with Supervision A verbal/visual cues. SLP Short Term Goal 3 - Progress (Week 3): Progressing toward goal SLP Short Term Goal 4 (Week 3): Pt will selectively attend to tasks with supervision A verbal cues for redirection. SLP Short Term Goal 4 - Progress (Week 3): Progressing toward goal    New Short Term Goals: Week 4: SLP Short Term Goal 1 (Week 4): STGs = LTGs d/t short remaining length of stay  Weekly Progress Updates:  Pt continues to made minimal progress towards cognition goals with goals recently being downgrading d/t slow progress. A review of this reporting period also reveals decreased number of sessions d/t HD. Pt currently requires a moderate amount of assistance for semi-complex problem solving and as such she will require assistance at discharge with medication administration. Additionally, pt continues to experience difficulty with attention to right and selective attention which will impact her ability to complete tasks with independence. As such, pt continues to require skilled ST to target the above mentioned deficits in problem solving and  attention to increase functional independence and reduce caregiver burden. However continue to anticipate that pt will require extensive physical and cognitive assistance at discharge. Education has been ongoing.       Daily Session  Skilled Therapeutic Interventions: Skilled treatment session focused on task initiation, selective attention and semi-complex problem solving. SLP recieved pt in bed with call light pressed. Pt had also received breakfast but hadn't eaten any of it. Pt required Mod A cues to state that she preferred dry/cold cereal for breakfast instead of full breakfast that was provided by facility. Although pt is aware that she can place order for meals, she hasn't done so. SLP facilitated. Pt also required Mod A cues for why pt needed nursing assistance. Pt had soiled brief but didn't demonstrate any initiation to tell this Probation officer. Pt required Min A to supervision level cues for selective attention to task during bed mobility and peri-care. Pt left upright in bed, bed alarm on and set-up completed for breakfast (Cheerios). Continue per current plan of care.    General    Pain Pain Assessment Pain Score: 0-No pain  Therapy/Group: Individual Therapy  Tlaloc Taddei 08/19/2019, 10:31 AM

## 2019-08-20 ENCOUNTER — Inpatient Hospital Stay (HOSPITAL_COMMUNITY): Payer: Medicare HMO | Admitting: Speech Pathology

## 2019-08-20 ENCOUNTER — Inpatient Hospital Stay (HOSPITAL_COMMUNITY): Payer: Medicare HMO | Admitting: Occupational Therapy

## 2019-08-20 ENCOUNTER — Ambulatory Visit (HOSPITAL_COMMUNITY): Payer: Medicare HMO | Admitting: Physical Therapy

## 2019-08-20 LAB — GLUCOSE, CAPILLARY
Glucose-Capillary: 109 mg/dL — ABNORMAL HIGH (ref 70–99)
Glucose-Capillary: 155 mg/dL — ABNORMAL HIGH (ref 70–99)
Glucose-Capillary: 205 mg/dL — ABNORMAL HIGH (ref 70–99)
Glucose-Capillary: 157 mg/dL — ABNORMAL HIGH (ref 70–99)

## 2019-08-20 MED ORDER — CHLORHEXIDINE GLUCONATE CLOTH 2 % EX PADS: 6.0000 | MEDICATED_PAD | Freq: Every day | CUTANEOUS | Status: DC

## 2019-08-20 NOTE — Plan of Care (Signed)
  Problem: RH Toileting Goal: LTG Patient will perform toileting task (3/3 steps) with assistance level (OT) Description: LTG: Patient will perform toileting task (3/3 steps) with assistance level (OT)  Flowsheets (Taken 08/20/2019 1315) LTG: Pt will perform toileting task (3/3 steps) with assistance level: (LTG discontinued as pt does not have sitting balance to sit safely on a BSC.) -- Note: LTG discontinued as pt does not have the sitting balance to safely sit on a BSC.   Problem: RH Toilet Transfers Goal: LTG Patient will perform toilet transfers w/assist (OT) Description: LTG: Patient will perform toilet transfers with assist, with/without cues using equipment (OT) Flowsheets (Taken 08/20/2019 1315) LTG: Pt will perform toilet transfers with assistance level of: (LTG discontinued as pt does not have the sitting balance to safely sit on a BSC.) -- Note: LTG discontinued as pt does not have the sitting balance to safely sit on a BSC.

## 2019-08-20 NOTE — Progress Notes (Signed)
Physical Therapy Session Note  Patient Details  Name: [name] MRN: [id number] Date of Birth: 11-03-1940  Today's Date: 08/20/2019 PT Individual Time: [id number] PT Individual Time Calculation (min): 68 min   Short Term Goals: Week 4:  PT Short Term Goal 1 (Week 4): = to LTGs based on ELOS  Skilled Therapeutic Interventions/Progress Updates:    Pt received supine in bed with her son, Ok Edwards, present for family education and pt agreeable to therapy session. Pt rolling R with min assist and rolling L with mod assist for pt's son to place hoyer sling under patient - min cuing for proper positioning. Pt's son performed hoyer lift transfer of pt from hospital bed to power w/c with therapist providing +2 assist and cuing throughout for proper sequencing and patient safety - pt's son demonstrating understanding. Pt's son reports he has caregiver set-up to start this weekend when pt is discharged. Pt's son reports that pt will need ambulance transport home as he is still in the process of purchasing a w/c accessible Lucianne Lei for pt transport to/from dialysis. Therapist educated pt's son on pt's CLOF using power w/c features with pt performing tilt feature for pressure relief with supervision and requiring varying min to max assist for w/c mobility depending on the environment. Pt performed power w/c mobility out of her room with mod assist for navigating through doorway due to R inattention. Pt able to navigate down straight hallway with min assist due to pt consistently veering R. Therapist performed dependent power w/c mobility to/from ADL apartment to replicate home environment. Pt required max assist to navigate power w/c through doorway to enter ADL apartment and max assist to move around the apartment room with max cuing to avoid obstacles on the R as pt getting close to bumping her R UE and R LE had therapist not corrected her. Patient reports at this time that she "doesn't feel confident" with navigating the  power w/c. Therapist discussed this with pt and her son and recommended that pt receive a manual TIS wheelchair since pt requires 24hr caregiver support and is unable to safely drive power w/c at this time - pt/son in agreement with this change in recommendation. Performed dependent w/c mobility back to room and pt requesting to return to bed. L lateral scoot transfer w/c>EOB using beasy board with max assist of 1 and +2 to place beasy board under her - cuing for pt to pull herself across using L UE with max assist for trunk control and sliding across. Sit>supine with max assist for B LE management and pt demonstrating improved trunk descent control via use of L UE. Pt therapeutically positioned in L sidelying while therapist educating pt's son on proper positioning for pressure relief. Pt left in sidelying with needs in reach and bed alarm on.   Therapy Documentation Precautions:  Precautions Precautions: Fall Precaution Comments: right hemiplegia Restrictions Weight Bearing Restrictions: No  Pain:   Reports some pain with placing the beasy board - repositioned for pain management.   Therapy/Group: Individual Therapy  Tawana Scale, PT, DPT 08/20/2019, 1:00 PM

## 2019-08-20 NOTE — Progress Notes (Signed)
Chaplain engaged in follow-up visit with [name] and her son regarding Scientist, physiological.  Chaplain let son know that he does not have to be present for AD to be notarized.  Chaplain will work to complete AD tomorrow as a Patent examiner and witnesses are available.  Son also voiced that he was looking to receive a handicap sticker.  Chaplain said she would speak to nurse about that and find out who could assist him in that matter.  Chaplain spoke with nurse.  Chaplain will follow-up tomorrow.

## 2019-08-20 NOTE — Progress Notes (Signed)
[facility] PHYSICAL MEDICINE & REHABILITATION PROGRESS NOTE   Subjective/Complaints:   Some loose stools but per pt better today  Discussed with pt and husband about palliative consult, she would like me to order to establish GOC    ROS: Patient deniesCP, SOB, N/V/D   Objective:   No results found. Recent Labs    08/19/19 0553  HGB 11.1*  HCT 35.2*   Recent Labs    08/19/19 0553  NA 133*  K 4.3  CL 96*  CO2 22  GLUCOSE 127*  BUN 93*  CREATININE 5.49*  CALCIUM 9.0    Intake/Output Summary (Last 24 hours) at 08/20/2019 0859 Last data filed at 08/20/2019 0847 Gross per 24 hour  Intake 873 ml  Output 2551 ml  Net -1678 ml     Physical Exam: Vital Signs Blood pressure (!) 106/59, pulse 78, temperature 98.6 F (37 C), temperature source Oral, resp. rate 18, height 5\' 8"  (1.727 m), weight 54 kg, SpO2 98 %. Constitutional: No distress . Vital signs reviewed. Husband at bedside; pt in bed HEENT: EOMI, oral membranes moist Neck: supple Cardiovascular: RRR with murmur. No JVD    Respiratory: CTA Bilaterally without wheezes or rales. Normal effort    GI: BS +, non-tender, non-distended  Rectal- not visualized (in w/c) Skin: Warm and dry.  Intact. Psych: Normal mood.  Normal behavior. Musc: No edema in extremities.  No tenderness in extremities. Neurological: Alert Motor: RUE/RLE: 0/5 proximal distal,trace finger flexion  LUE: 4/5 proximal to distal LLE: 4-/5 proximal to distal  Assessment/Plan: 1. 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  Physiatrist is providing close team supervision and 24 hour management of active medical problems listed below.  Physiatrist and rehab team continue to assess barriers to discharge/monitor patient progress toward functional and medical goals  Care Tool:  Bathing    Body parts bathed by patient: Chest, Abdomen,  Front perineal area, Right upper leg, Left upper leg, Face   Body parts bathed by helper: Buttocks, Right lower leg, Left lower leg, Right arm, Left arm Body parts n/a: Front perineal area, Buttocks, Right upper leg, Left lower leg, Right lower leg, Left upper leg(did not attempt)   Bathing assist Assist Level: Moderate Assistance - Patient 50 - 74%     Upper Body Dressing/Undressing Upper body dressing   What is the patient wearing?: Pull over shirt    Upper body assist Assist Level: Moderate Assistance - Patient 50 - 74%    Lower Body Dressing/Undressing Lower body dressing      What is the patient wearing?: Incontinence brief, Pants     Lower body assist Assist for lower body dressing: Maximal Assistance - Patient 25 - 49%     Toileting Toileting    Toileting assist Assist for toileting: Dependent - Patient 0%     Transfers Chair/bed transfer  Transfers assist     Chair/bed transfer assist level: 2 Helpers     Locomotion Ambulation   Ambulation assist   Ambulation activity did not occur: Safety/medical concerns(poor postural control and decreased standing ability)          Walk 10 feet activity   Assist  Walk 10 feet activity did not occur: Safety/medical concerns        Walk 50 feet activity   Assist Walk 50 feet with 2 turns activity did not occur: Safety/medical concerns  Walk 150 feet activity   Assist Walk 150 feet activity did not occur: Safety/medical concerns         Walk 10 feet on uneven surface  activity   Assist Walk 10 feet on uneven surfaces activity did not occur: Safety/medical concerns         Wheelchair     Assist Will patient use wheelchair at discharge?: Yes Type of Wheelchair: Power    Wheelchair assist level: Minimal Assistance - Patient > 75% Max wheelchair distance: 25    Wheelchair 50 feet with 2 turns activity    Assist        Assist Level: Minimal Assistance - Patient >  75%   Wheelchair 150 feet activity     Assist      Assist Level: Minimal Assistance - Patient > 75%   Blood pressure (!) 106/59, pulse 78, temperature 98.6 F (37 C), temperature source Oral, resp. rate 18, height 5\' 8"  (1.727 m), weight 54 kg, SpO2 98 %.  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 Team conf in am   -order palliative care consult given multiple co-morbidities/complications below 2. Antithrombotics: -DVT/anticoagulation:SCDs -antiplatelet therapy: Aspirin 81 mg daily and Plavix 75 mg daily x3 weeks then aspirin alone 3. Pain Management:Tylenol as needed 4. Mood:Xanax 1 mg nightly held for lethargy which has improved  - resumed at bedtime prn at lower dose  -antipsychotic agents: N/A 5. Neuropsych: This patientiscapable of making decisions on herown behalf. 6. Skin/Wound Care:Routine skin checks -local skin care for abrasions -nutrition discussed  -benadryl prn for itching  -sarna lotion prn 7. Fluids/Electrolytes/Nutrition:Routine and outs 8.  ESRD. Hemodialysis per renal services. HD after therapy day to allow for better therapy participation 9. Orthostasis. Continue ProAmatine 10 mg Monday Wednesday Friday   Vitals:   08/19/19 2017 08/20/19 0525  BP: (!) 90/50 (!) 106/59  Pulse: 79 78  Resp: 16 18  Temp: 99.1 F (37.3 C) 98.6 F (37 C)  SpO2: 96% 98%  controlled 12/28 10. Hyperlipidemia. Crestor 11. Acute on chronic combined congestive heart failure. Monitor for any signs of fluid overload. Daily weights  TEE suggesting EF of 35% with mild MR, TR, AS Filed Weights   08/19/19 1450 08/19/19 1655 08/20/19 0525  Weight: 55 kg 53.7 kg 54 kg    Fluid balance as per Nephro 12. Diabetes mellitus type II with hyperglycemia, hemoglobin A1c 5.3.   CBG (last 3)  Recent  Labs    08/19/19 1736 08/19/19 2121 08/20/19 0610  GLUCAP 88 182* 109*  controlled   13. History of tobacco abuse. Counseling 14. CAD with CABG 2010. Continue aspirin. No chest pain reported. 15. IBS/history of GI bleed. hold Amitiza24 mcg daily - due to diarrhea  16.  Social son threatened to break through security to visit his mom  87.  Chronic cystitis  Seen by urology, on no abx at present 18.  Diarrhea- C diff neg,was on  Amitiza took this every other day at home to avoid diarrhea   -being held for now  Prn antidiarrheal 19.  Vaginal d/c: appreciate GYN eval- endometrial mass will need f/u  20. Fever x 1 with leukocytosis, off vanc and Zosyn,and  augmentin --afebrile   Xanax DC'd- appears brighter but sleep is altered will restart low dose prn at night   Fluid status per Nephro given HD   Patient oliguric-ESRD    Chest x-ray  ordered- no evidence of infiltrate 21.  ABLA GI bleed hx of same (diverticular per GI), Hgb responded to 2 U PRBC , appreciate GI consult --outpt f/u with Dr. Collene Mares   Hgb stable at 7.2 no orthostasic symptoms  1/1: Received 2U PRBC       LOS: 24 days A FACE TO [place] [name] 08/20/2019, 8:59 AM

## 2019-08-20 NOTE — Progress Notes (Signed)
Occupational Therapy Session Note  Patient Details  Name: [name] MRN: [id number] Date of Birth: 28-May-1941  Today's Date: 08/20/2019 OT Individual Time: 8295-6213 OT Individual Time Calculation (min): 33 min    Short Term Goals: Week 1:  OT Short Term Goal 1 (Week 1): Pt will maintain sitting balance with min assist to prepare for ADL tasks OT Short Term Goal 1 - Progress (Week 1): Not met OT Short Term Goal 2 (Week 1): Pt will complete UB bathing with min assist OT Short Term Goal 2 - Progress (Week 1): Progressing toward goal OT Short Term Goal 3 (Week 1): Pt will complete LB bathing with max assist of 1 caregiver OT Short Term Goal 3 - Progress (Week 1): Not met OT Short Term Goal 4 (Week 1): Pt will complete toilet transfer with max assist of one caregiver OT Short Term Goal 4 - Progress (Week 1): Not met Week 2:  OT Short Term Goal 1 (Week 2): Pt will be able to sit EOB with mod A for balance to prepare for her ability to sit unsupported on a toilet. OT Short Term Goal 1 - Progress (Week 2): Met OT Short Term Goal 2 (Week 2): Pt will be able to use a slide board with mod A of 1 bed to w/c to prepare for her ability to do toilet transfers. OT Short Term Goal 2 - Progress (Week 2): Progressing toward goal OT Short Term Goal 3 (Week 2): Pt will demonstrate improved awareness of RUE with self ROM with min A and min cues. OT Short Term Goal 3 - Progress (Week 2): Met Week 3:  OT Short Term Goal 1 (Week 3): FAmily education to be completed with hoyer lift OT Short Term Goal 2 (Week 3): Slide board transfer bed to chair with therapy only with mod A +1  Skilled Therapeutic Interventions/Progress Updates:    Pt received in bed requesting a sponge bath and clothing change.  Pt does have more R active finger movement but continues to have R inattention and needs max cues to attend to her RUE with self care and mobility. Overall bathes with mod A, dressed UB with mod A, LB with max A.   Focused on bed positioning and getting into a comfortable r sidelying positon. Educated pt on best placement of pillows (between legs, behind back, under L arm) and position of R shoulder in slight protraction.  RN aware of her position. Pt resting with all needs met. Bed alarm on.   Therapy Documentation Precautions:  Precautions Precautions: Fall Precaution Comments: right hemiplegia Restrictions Weight Bearing Restrictions: No  Pain: Pain Assessment Pain Scale: 0-10 Pain Score: 0-No pain ADL: ADL Eating: Set up Where Assessed-Eating: Chair Upper Body Bathing: Moderate assistance Where Assessed-Upper Body Bathing: Bed level Lower Body Bathing: Maximal assistance Where Assessed-Lower Body Bathing: Bed level Upper Body Dressing: Moderate assistance Where Assessed-Upper Body Dressing: Bed level Lower Body Dressing: Maximal assistance Where Assessed-Lower Body Dressing: Bed level Toilet Transfer: Maximal assistance(+2) Toilet Transfer Method: Stand pivot Toilet Transfer Equipment: Drop arm bedside commode  Therapy/Group: Individual Therapy  [place] 08/20/2019, 11:58 AM

## 2019-08-20 NOTE — Plan of Care (Signed)
  Problem: RH Wheelchair Mobility Goal: LTG Patient will propel w/c in controlled environment (PT) Description: LTG: Patient will propel wheelchair in controlled environment, # of feet with assist (PT) Outcome: Not Applicable Flowsheets (Taken 08/20/2019 1844) LTG: Pt will propel w/c in controlled environ  assist needed:: (based on pt progress she will be receiving TIS w/c requiring dependent propulsion) -- Note: based on pt progress she will be receiving TIS w/c requiring dependent propulsion Goal: LTG Patient will propel w/c in home environment (PT) Description: LTG: Patient will propel wheelchair in home environment, # of feet with assistance (PT). Outcome: Not Applicable Flowsheets (Taken 08/20/2019 1844) LTG: Pt will propel w/c in home environ  assist needed:: (based on pt progress she will be receiving TIS w/c requiring dependent propulsion) -- Note: based on pt progress she will be receiving TIS w/c requiring dependent propulsion

## 2019-08-20 NOTE — Progress Notes (Signed)
Speech Language Pathology Daily Session Note  Patient Details  Name: [name] MRN: [id number] Date of Birth: 06-30-1941  Today's Date: 08/20/2019 SLP Individual Time: 0830-0900 SLP Individual Time Calculation (min): 30 min  Short Term Goals: Week 4: SLP Short Term Goal 1 (Week 4): STGs = LTGs d/t short remaining length of stay  Skilled Therapeutic Interventions: Skilled treatment session focused on cognition goals. SLP targeted mediation administration concepts. SLP provided instruction for pt to demonstrate how to organize medicine for 1 x day progressing to BID. Pt lacked strategies for effective administration. When SLP introduced strategy of opening all sections and then closing sections, pt required Mod A cues to effectively implement. She was able to recognize an instance of misadministration but required Max A cues to correct mistakes d/t difficulty sequentially processing task. Pt left upright in bed, bed alarm on and all needs within reach.      Pain    Therapy/Group: Individual Therapy  Jalasia Eskridge 08/20/2019, 12:16 PM

## 2019-08-21 ENCOUNTER — Inpatient Hospital Stay (HOSPITAL_COMMUNITY): Payer: Medicare HMO

## 2019-08-21 ENCOUNTER — Inpatient Hospital Stay (HOSPITAL_COMMUNITY): Payer: Medicare HMO | Admitting: Physical Therapy

## 2019-08-21 ENCOUNTER — Inpatient Hospital Stay (HOSPITAL_COMMUNITY): Payer: Medicare HMO | Admitting: Occupational Therapy

## 2019-08-21 LAB — PHOSPHORUS: Phosphorus: 3.1 mg/dL (ref 2.5–4.6)

## 2019-08-21 LAB — HEMOGLOBIN AND HEMATOCRIT, BLOOD
HCT: 33 % — ABNORMAL LOW (ref 36.0–46.0)
Hemoglobin: 10.3 g/dL — ABNORMAL LOW (ref 12.0–15.0)

## 2019-08-21 LAB — GLUCOSE, CAPILLARY
Glucose-Capillary: 122 mg/dL — ABNORMAL HIGH (ref 70–99)
Glucose-Capillary: 112 mg/dL — ABNORMAL HIGH (ref 70–99)
Glucose-Capillary: 150 mg/dL — ABNORMAL HIGH (ref 70–99)
Glucose-Capillary: 149 mg/dL — ABNORMAL HIGH (ref 70–99)

## 2019-08-21 MED ORDER — DARBEPOETIN ALFA 40 MCG/0.4ML IJ SOSY
40.0000 ug | PREFILLED_SYRINGE | INTRAMUSCULAR | Status: DC
  Filled 2019-08-21: qty 0.4

## 2019-08-21 NOTE — Progress Notes (Signed)
Occupational Therapy Session Note  Patient Details  Name: LULAMAE SKORUPSKI MRN: [id number] Date of Birth: May 17, 1941  Today's Date: 08/21/2019 OT Individual Time: 1130-1210 OT Individual Time Calculation (min): 40 min    Short Term Goals: Week 1:  OT Short Term Goal 1 (Week 1): Pt will maintain sitting balance with min assist to prepare for ADL tasks OT Short Term Goal 1 - Progress (Week 1): Not met OT Short Term Goal 2 (Week 1): Pt will complete UB bathing with min assist OT Short Term Goal 2 - Progress (Week 1): Progressing toward goal OT Short Term Goal 3 (Week 1): Pt will complete LB bathing with max assist of 1 caregiver OT Short Term Goal 3 - Progress (Week 1): Not met OT Short Term Goal 4 (Week 1): Pt will complete toilet transfer with max assist of one caregiver OT Short Term Goal 4 - Progress (Week 1): Not met Week 2:  OT Short Term Goal 1 (Week 2): Pt will be able to sit EOB with mod A for balance to prepare for her ability to sit unsupported on a toilet. OT Short Term Goal 1 - Progress (Week 2): Met OT Short Term Goal 2 (Week 2): Pt will be able to use a slide board with mod A of 1 bed to w/c to prepare for her ability to do toilet transfers. OT Short Term Goal 2 - Progress (Week 2): Progressing toward goal OT Short Term Goal 3 (Week 2): Pt will demonstrate improved awareness of RUE with self ROM with min A and min cues. OT Short Term Goal 3 - Progress (Week 2): Met Week 3:  OT Short Term Goal 1 (Week 3): FAmily education to be completed with hoyer lift OT Short Term Goal 2 (Week 3): Slide board transfer bed to chair with therapy only with mod A +1      Skilled Therapeutic Interventions/Progress Updates:    Pt received in power w.c tilted back. She was able to turn on the wc and tilt chair so it was fully upright without cues.   Worked on PROM scapular glides and wrist extension. Pt continues to have tight wrist flexors with limited range of motion.  Using her full lap tray, PROM  with shoulder flex/ extension and circular hand rotations on tray. No active elbow or shoulder.  Improved finger flexion, worked on hand over hand guidance of grasping the cones.   Pt's dialysis transport arrived. NT and this therapist used hoyer lift to transfer pt back to bed.  Supported arm with pillow.   Pt tolerated transfer well.   Therapy Documentation Precautions:  Precautions Precautions: Fall Precaution Comments: right hemiplegia Restrictions Weight Bearing Restrictions: No       Pain: Pain Assessment Pain Score: 0-No pain ADL: ADL Eating: Set up Where Assessed-Eating: Chair Upper Body Bathing: Moderate assistance Where Assessed-Upper Body Bathing: Bed level Lower Body Bathing: Maximal assistance Where Assessed-Lower Body Bathing: Bed level Upper Body Dressing: Moderate assistance Where Assessed-Upper Body Dressing: Bed level Lower Body Dressing: Maximal assistance Where Assessed-Lower Body Dressing: Bed level Toilet Transfer: Maximal assistance(+2) Toilet Transfer Method: Stand pivot Toilet Transfer Equipment: Drop arm bedside commode  Therapy/Group: Individual Therapy  [place] 08/21/2019, 12:18 PM

## 2019-08-21 NOTE — Patient Care Conference (Signed)
Inpatient RehabilitationTeam Conference and Plan of Care Update Date: 08/21/2019   Time: 10:45 AM   Patient Name: [name]      Medical Record Number: [id number]  Date of Birth: 03/02/1941 Sex: Female         Room/Bed: 4W25C/4W25C-01 Payor Info: Payor: [employer] MEDICARE / Plan: [place] MEDICARE HMO/PPO / Product Type: *No Product type* /    Admit Date/Time:  07/27/2019  6:07 PM  Primary Diagnosis:  Small vessel cerebrovascular accident (CVA) [facility])  Patient Active Problem List   Diagnosis Date Noted  . Lethargy   . ESRD on dialysis ([place])   . Controlled type 2 diabetes mellitus with hyperglycemia, without long-term current use of insulin ([place])   . Labile blood glucose   . Chronic cystitis   . Diarrhea due to malabsorption   . Small vessel cerebrovascular accident (CVA) ([place]) 07/27/2019  . Stage I pressure ulcer of sacral region 07/25/2019  . TIA (transient ischemic attack) 07/23/2019  . CVA (cerebral vascular accident) ([place]) 07/23/2019  . Volume overload 05/27/2018  . Dyspnea 05/27/2018  . Acute respiratory failure with hypoxia ([place]) 05/27/2018  . Acute respiratory distress 05/27/2018  . Acute encephalopathy 05/27/2018  . Aspiration pneumonia ([place]) 05/27/2018  . Pressure ulcer 05/19/2018  . Acute on chronic combined systolic and diastolic heart failure ([place]) 05/17/2018  . LBBB (left bundle branch block) 05/17/2018  . Aortic stenosis, moderate 05/17/2018  . NSTEMI (non-ST elevated myocardial infarction) ([place]) 05/16/2018  . Symptomatic anemia 05/15/2018  . Demand ischemia ([place]) 05/15/2018  . Protein-calorie malnutrition, severe 04/20/2018  . Pressure injury of skin 04/18/2018  . Lower GI bleeding 10/31/2016  . Blood loss anemia 10/31/2016  . AKI (acute kidney injury) ([place]) 10/31/2016  . Stroke ([place]) 09/26/2016  . ESRD (end stage renal disease) on dialysis ([place]) 06/23/2015  . Balance problem 12/22/2013  . Edema 12/28/2011  . CLAUDICATION 11/01/2010  . CAROTID ARTERY STENOSIS  10/29/2009  . AORTIC STENOSIS 09/17/2009  . Essential hypertension 11/17/2008  . DIASTOLIC HEART FAILURE, CHRONIC 11/17/2008  . Diabetes mellitus type II, non insulin dependent ([place]) 11/12/2008  . Other and unspecified hyperlipidemia 11/12/2008  . Anemia, chronic renal failure 11/12/2008  . Thrombocytopenia ([place]) 11/12/2008  . Anxiety state 11/12/2008  . Coronary atherosclerosis 11/12/2008  . UNSPEC COMBINED SYSTOLIC&DIASTOLIC HEART FAILURE 0000000  . ESOPHAGEAL REFLUX 11/12/2008  . Chronic kidney disease (CKD), stage V ([place]) 11/12/2008  . LOW BACK PAIN, CHRONIC 11/12/2008  . INSOMNIA UNSPECIFIED 11/12/2008    Expected Discharge Date: Expected Discharge Date: 08/24/19  Team Members Present: Social Worker Present: Lennart Pall, LCSW Nurse Present: Serena Croissant, LPN Case Manager: Karene Fry, RN PT Present: Michaelene Song, PT OT Present: Simonne Come, OT SLP Present: Charolett Bumpers, SLP PPS Coordinator present : Gunnar Fusi, Novella Olive, PT     Current Status/Progress Goal Weekly Team Focus  Bowel/Bladder   patient is incontinent of BM - loose stools and foley catheter in place for retention  patient will regain continence  foley care twice a day and during bowel incontinence   Swallow/Nutrition/ Hydration             ADL's   working on [employer] lift and bed mobility education with family, bed level ADLs, positioning - overall pt is mod A UB and max LB B/D, only using bed pan due to incontinence  mod A bathing, mod UB dressing, max LB dressing, toileting goals discontinued as not safe for pt to use BSC or toilet due to severely impaired  sitting balance  working on [employer] and bed mobility education with family, bed level ADLs, positioning   Mobility   min assist rolling R, mod assist rolling L, max assist supine<>sit, max assist +2 bed<>chair via beasy board (or dependent via hoyer lift), varying CGA/min assist static sitting balance and max assist for dynamic sitting  balance  max assist transfers and bed mobility, min assist sitting balance  pt/family education, hands-on family training for hoyer transfer and w/c management, pressure relief education, d/c planning, sitting balance, activity tolerance, w/c evaluation, R hemibody NMR   Communication             Safety/Cognition/ Behavioral Observations  Mod to Max for semi-complex  Mod A for basic - downgraded given fluctuating medical issues and abilities  basic problem solving, recall and selective attention   Pain   patient denies pain  patient will continue pain free during at rest or with activity  q shift and PRN pain assessment   Skin   stage 2 pressure ulcer in buttocks and MASD to groin, skin tear and ecchymosis in BUE and LLE  prevent further skin breakdown and infection       Rehab Goals Patient on target to meet rehab goals: Yes *See Care Plan and progress notes for long and short-term goals.     Barriers to Discharge  Current Status/Progress Possible Resolutions Date Resolved   Nursing                  PT                    OT                  SLP                SW                Discharge Planning/Teaching Needs:  Husband and son are arranging/ providing 24/7 caregiving in the home.  Teaching is underway.   Team Discussion: Consulted palliative for goals of care, ESRD, not GI bleeding, Hg stable.  RN MASD on bottom, inc B/B, HD today.  OT hoyer lift ed for family, mod A UB/max A LB B/D, inc, using bed pan.  PT min roll right, mod to left, max +2 BZ board transfer, fam ed with son and husband, min A power chair.  SLP downgrade goals to mod A for basic cognition.   Revisions to Treatment Plan: N/A     Medical Summary Current Status: Patient tried using motorized chair but is limited by right neglect.  Renal status stable, GI bleed has improved Weekly Focus/Goal: Establish equipment needs, will likely use manual wheelchair at home assistance     Possible Resolutions to  Barriers: May change CBC to weekly, continue caregiver training, establish goals of care   Continued Need for Acute Rehabilitation Level of Care: The patient requires daily medical management by a physician with specialized training in physical medicine and rehabilitation for the following reasons: Direction of a multidisciplinary physical rehabilitation program to maximize functional independence : Yes Medical management of patient stability for increased activity during participation in an intensive rehabilitation regime.: Yes Analysis of laboratory values and/or radiology reports with any subsequent need for medication adjustment and/or medical intervention. : Yes   I attest that I was present, lead the team conference, and concur with the assessment and plan of the team.   Retta Diones 08/22/2019, 2:34 PM  Team conference was held via web/ teleconference due to [place] - 19

## 2019-08-21 NOTE — Progress Notes (Signed)
[facility] PHYSICAL MEDICINE & REHABILITATION PROGRESS NOTE   Subjective/Complaints:   Poor appetite  Pt without new issues     ROS: Patient deniesCP, SOB, N/V/D   Objective:   No results found. Recent Labs    08/19/19 0553 08/21/19 0604  HGB 11.1* 10.3*  HCT 35.2* 33.0*   Recent Labs    08/19/19 0553  NA 133*  K 4.3  CL 96*  CO2 22  GLUCOSE 127*  BUN 93*  CREATININE 5.49*  CALCIUM 9.0    Intake/Output Summary (Last 24 hours) at 08/21/2019 0806 Last data filed at 08/20/2019 1901 Gross per 24 hour  Intake 536 ml  Output 150 ml  Net 386 ml     Physical Exam: Vital Signs Blood pressure 112/61, pulse 73, temperature 98 F (36.7 C), temperature source Oral, resp. rate 16, height '5\' 8"'  (1.727 m), weight 55.3 kg, SpO2 100 %. Constitutional: No distress . Vital signs reviewed. Husband at bedside; pt in bed HEENT: EOMI, oral membranes moist Neck: supple Cardiovascular: RRR with murmur. No JVD    Respiratory: CTA Bilaterally without wheezes or rales. Normal effort    GI: BS +, non-tender, non-distended  Rectal- not visualized (in w/c) Skin: Warm and dry.  Intact. Psych: Normal mood.  Normal behavior. Musc: No edema in extremities.  No tenderness in extremities. Neurological: Alert Motor: RUE/RLE: 0/5 proximal distal,trace finger flexion  LUE: 4/5 proximal to distal LLE: 4-/5 proximal to distal  Assessment/Plan: 1. 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  Physiatrist is providing close team supervision and 24 hour management of active medical problems listed below.  Physiatrist and rehab team continue to assess barriers to discharge/monitor patient progress toward functional and medical goals  Care Tool:  Bathing    Body parts bathed by patient: Chest, Abdomen, Front perineal area, Right upper leg, Left upper leg, Face, Left lower leg   Body  parts bathed by helper: Right lower leg, Buttocks, Left arm, Right arm Body parts n/a: Front perineal area, Buttocks, Right upper leg, Left lower leg, Right lower leg, Left upper leg(did not attempt)   Bathing assist Assist Level: Moderate Assistance - Patient 50 - 74%     Upper Body Dressing/Undressing Upper body dressing   What is the patient wearing?: Pull over shirt    Upper body assist Assist Level: Moderate Assistance - Patient 50 - 74%    Lower Body Dressing/Undressing Lower body dressing      What is the patient wearing?: Incontinence brief, Pants     Lower body assist Assist for lower body dressing: Maximal Assistance - Patient 25 - 49%     Toileting Toileting    Toileting assist Assist for toileting: Dependent - Patient 0%     Transfers Chair/bed transfer  Transfers assist     Chair/bed transfer assist level: 2 Helpers     Locomotion Ambulation   Ambulation assist   Ambulation activity did not occur: Safety/medical concerns(poor postural control and decreased standing ability)          Walk 10 feet activity   Assist  Walk 10 feet activity did not occur: Safety/medical concerns        Walk 50 feet activity   Assist Walk 50 feet with 2 turns activity did not occur: Safety/medical concerns         Walk 150 feet activity   Assist Walk 150 feet activity did not occur:  Safety/medical concerns         Walk 10 feet on uneven surface  activity   Assist Walk 10 feet on uneven surfaces activity did not occur: Safety/medical concerns         Wheelchair     Assist Will patient use wheelchair at discharge?: Yes Type of Wheelchair: Manual    Wheelchair assist level: Minimal Assistance - Patient > 75% Max wheelchair distance: 164f    Wheelchair 50 feet with 2 turns activity    Assist        Assist Level: Minimal Assistance - Patient > 75%   Wheelchair 150 feet activity     Assist      Assist Level:  Minimal Assistance - Patient > 75%   Blood pressure 112/61, pulse 73, temperature 98 F (36.7 C), temperature source Oral, resp. rate 16, height '5\' 8"'  (1.727 m), weight 55.3 kg, SpO2 100 %.  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 Formulized individual treatment plan based on medical history, underlying problem and comorbidities.  -order palliative care consult given multiple co-morbidities/complications below 2. Antithrombotics: -DVT/anticoagulation:SCDs -antiplatelet therapy: Aspirin 81 mg daily and Plavix 75 mg daily x3 weeks then aspirin alone 3. Pain Management:Tylenol as needed 4. Mood:Xanax 1 mg nightly held for lethargy which has improved  - resumed at bedtime prn at lower dose  -antipsychotic agents: N/A 5. Neuropsych: This patientiscapable of making decisions on herown behalf. 6. Skin/Wound Care:Routine skin checks -local skin care for abrasions -nutrition discussed  -benadryl prn for itching  -sarna lotion prn 7. Fluids/Electrolytes/Nutrition:Routine and outs 8.  ESRD. Hemodialysis per renal services. HD after therapy day to allow for better therapy participation 9. Orthostasis. Continue ProAmatine 10 mg Monday Wednesday Friday   Vitals:   08/20/19 2000 08/21/19 0448  BP: 94/66 112/61  Pulse: 76 73  Resp:  16  Temp: 98.6 F (37 C) 98 F (36.7 C)  SpO2: 96% 100%  controlled 12/28 10. Hyperlipidemia. Crestor 11. Acute on chronic combined congestive heart failure. Monitor for any signs of fluid overload. Daily weights  TEE suggesting EF of 35% with mild MR, TR, AS Filed Weights   08/19/19 1655 08/20/19 0525 08/21/19 0448  Weight:  53.7 kg 54 kg 55.3 kg    Fluid balance as per Nephro 12. Diabetes mellitus type II with hyperglycemia, hemoglobin A1c 5.3.   CBG (last 3)  Recent Labs    08/20/19 1632 08/20/19 2102 08/21/19 0625  GLUCAP 205* 157* 122*  controlled , occ elevation   13. History of tobacco abuse. Counseling 14. CAD with CABG 2010. Continue aspirin. No chest pain reported. 15. IBS/history of GI bleed. hold Amitiza24 mcg daily - due to diarrhea  16.  Social son threatened to break through security to visit his mom  168  Chronic cystitis  Seen by urology, on no abx at present 18.  Diarrhea- C diff neg,was on  Amitiza took this every other day at home to avoid diarrhea   -being held for now  Prn antidiarrheal 19.  Vaginal d/c: appreciate GYN eval- endometrial mass will need f/u  20. Fever x 1 with leukocytosis, off vanc and Zosyn,and  augmentin --afebrile   Xanax DC'd- appears brighter but sleep is altered will restart low dose prn at night   Fluid status per  Nephro given HD   Patient oliguric-ESRD    Chest x-ray ordered- no evidence of infiltrate 21.  ABLA GI bleed hx of same (diverticular per GI), Hgb responded to 2 U PRBC , appreciate GI consult --outpt f/u with Dr. Collene Mares   Hgb stable at 7.2 no orthostasic symptoms  1/1: Received 2U PRBC Hgb 10.3 on 1/6       LOS: 25 days A FACE TO [place] E Shrika Milos 08/21/2019, 8:06 AM

## 2019-08-21 NOTE — Progress Notes (Addendum)
[facility] Progress Note   Subjective: Up in chair, no complaints. HD today later on schedule.   Objective Vitals:   08/20/19 0525 08/20/19 1448 08/20/19 2000 08/21/19 0448  BP: (!) 106/59 (!) 95/49 94/66 112/61  Pulse: 78 75 76 73  Resp: 18 14  16   Temp: 98.6 F (37 C)  98.6 F (37 C) 98 F (36.7 C)  TempSrc: Oral  Oral Oral  SpO2: 98% 98% 96% 100%  Weight: 54 kg   55.3 kg  Height:       Physical Exam General: Frail appearing elderly female in NAD Heart: S1,S2 no M/R/G Lungs: Bilateral breath sounds slightly decreased in bases otherwise CTAB GI/GU: Abdomen S, NT. Foley patent with dark yellow urine.  Extremities: No LE edema Dialysis Access: RIJ TDC drsg CDI.    Additional Objective Labs: Basic Metabolic Panel: Recent Labs  Lab 08/15/19 1400 08/16/19 1837 08/19/19 0553  NA 135 135 133*  K 4.6 3.0* 4.3  CL 99 98 96*  CO2 22 25 22   GLUCOSE 169* 128* 127*  BUN 100* 19 93*  CREATININE 5.54* 1.82* 5.49*  CALCIUM 8.3* 8.7* 9.0  PHOS  --  1.5*  --    Liver Function Tests: Recent Labs  Lab 08/16/19 1837 08/19/19 0553  AST  --  23  ALT  --  20  ALKPHOS  --  74  BILITOT  --  0.6  PROT  --  6.2*  ALBUMIN 2.7* 2.6*   No results for input(s): LIPASE, AMYLASE in the last 168 hours. CBC: Recent Labs  Lab 08/15/19 1400 08/16/19 1837 08/17/19 0400 08/19/19 0553 08/21/19 0604  WBC 7.7 7.8  --   --   --   NEUTROABS  --  5.7  --   --   --   HGB 6.9* 11.6* 10.9* 11.1* 10.3*  HCT 22.4* 35.5* 33.8* 35.2* 33.0*  MCV 101.8* 93.2  --   --   --   PLT 125* 122*  --   --   --    Blood Culture    Component Value Date/Time   SDES BLOOD LEFT HAND 08/04/2019 2246   SPECREQUEST  08/04/2019 2246    BOTTLES DRAWN AEROBIC AND ANAEROBIC Blood Culture adequate volume   CULT  08/04/2019 2246    NO GROWTH 5 DAYS Performed at [facility], [place] [address], [place],  [address]    REPTSTATUS 08/09/2019 FINAL 08/04/2019 2246    Cardiac  Enzymes: No results for input(s): CKTOTAL, CKMB, CKMBINDEX, TROPONINI in the last 168 hours. CBG: Recent Labs  Lab 08/20/19 0610 08/20/19 1118 08/20/19 1632 08/20/19 2102 08/21/19 0625  GLUCAP 109* 155* 205* 157* 122*   Iron Studies: No results for input(s): IRON, TIBC, TRANSFERRIN, FERRITIN in the last 72 hours. @lablastinr3 @ Studies/Results: No results found. Medications:  . aspirin  81 mg Oral Daily  . carbamide peroxide  5 drop Both EARS BID  . Chlorhexidine Gluconate Cloth  6 each Topical Q0600  . darbepoetin (ARANESP) injection - DIALYSIS  150 mcg Intravenous Q Thu-HD  . feeding supplement (NEPRO CARB STEADY)  237 mL Oral TID BM  . feeding supplement (PRO-STAT SUGAR FREE 64)  30 mL Oral BID  . folic acid  1 mg Oral Daily  . Gerhardt's butt cream   Topical QID  . insulin aspart  0-9 Units Subcutaneous TID WC  . miconazole nitrate   Topical BID  . midodrine  10 mg Oral Q M,W,F-HD  . multivitamin  1 tablet Oral QHS  . pantoprazole  40 mg Oral BID  . rosuvastatin  20 mg Oral q1800  . saccharomyces boulardii  250 mg Oral BID  . vitamin B-12  500 mcg Oral Daily     Dialysis OP:MWF NW 4h 300/600 51.5kg 2/2.25 bath TDC P4 Heparin 1000 - Mircera 200 mcg IV q 2 weeks  Problem/Plan: 1. Status post left ACA [id number] debility and R hemiparesis. CVA presumed to be embolic with negative work-up.On CIR. 2. ESRD:usual HD is MWF. Next HD today , shortened Rx d/t high census.   3. Anemia abl/ ckd: last HGB 10.3. This is actually probably lower as H & H lab is traditionally higher HGB than CBC. Has rec'd 4 units PRBCs since adm. Weekly Aranesp, adjusted dose. Will give today.  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 Recheck RFP in HD today. Binders/VDRA on hold.  5. Nutrition: Alb 2.6. Regular diet, add prostat, renal vit.  6. Hypotension/ volume: gets midodrine pre HD 10mg  tiw. No BP lowering meds. Euvolemic on exam. Last HD 08/19/19 net UF 2.0 L. UF as  tolerated today 1-1.5 liters.  Rita H. Brown NP-C 08/21/2019, 11:16 AM  [facility] [phone]  Nephrology attending: Patient was seen and examined at bedside.  Agree with assessment and plan as outlined above. Patient with ESRD, had stroke and is now undergoing inpatient rehab.  Receiving dialysis Monday Wednesday Friday therefore plan for next HD today.  Doing short treatment sessions because of many patients requiring dialysis inpatient.  Hemoglobin at goal. Off binders now because of low phosphorus. Repeat Phos level.   Lawson Radar, MD [facility].

## 2019-08-21 NOTE — Progress Notes (Signed)
Physical Therapy Session Note  Patient Details  Name: [name] MRN: [id number] Date of Birth: [birth year]  Today's Date: 08/21/2019 PT Individual Time: 0800-0845 PT Individual Time Calculation (min): 45 min   Short Term Goals: Week 4:  PT Short Term Goal 1 (Week 4): = to LTGs based on ELOS  Skilled Therapeutic Interventions/Progress Updates:   Pt received supine in bed and agreeable to PT. Rolling R and x 2 each with mod assist to the R and max assist to the L to don pants and and place lift sling. Hoyer lift transfer to [facility]. WC mobility through hall with supervision assist for hallway navigation and min assist for doorway management. Moderate cues for awareness of door frame on the R. Sit<>stand in parallel bars x 2 with max assist and UE support on the L side. Pt able to tolerate standing with R knee blocked x 7 seconds and 3 seconds, before requiring rest break due to FOF and LLE fatigue. Patient returned to room and left sitting in [facility] with call bell in reach and all needs met.         Therapy Documentation Precautions:  Precautions Precautions: Fall Precaution Comments: right hemiplegia Restrictions Weight Bearing Restrictions: No    Pain:  denies    Therapy/Group: Individual Therapy  Lorie Phenix 08/21/2019, 8:53 AM

## 2019-08-21 NOTE — Progress Notes (Signed)
Speech Language Pathology Daily Session Note  Patient Details  Name: [name] MRN: [id number] Date of Birth: [birth year]  Today's Date: 08/21/2019 SLP Individual Time: 1000-1027 SLP Individual Time Calculation (min): 27 min  Short Term Goals: Week 4: SLP Short Term Goal 1 (Week 4): STGs = LTGs d/t short remaining length of stay  Skilled Therapeutic Interventions: Skilled ST services focused on education and cognitive skills. Pt's husband present for treatment session and requested assistance placing right hearing aid. SLP provided education and demonstration on how to check if battery is working, replace battery and to apply right hearing aid. Pt demonstrated difficulty navigating power chair (moving from tiled up to down vice verse) SLP provided demonstration and pt returned demonstration with min A verbal cues for recall. SLP continued addressing functional problem solving and education pertaining to medication, pt required mod A verbal cues for error awareness/recall during task while filling out medication x1 and x2 per day. Pt demonstrated accurate verbal problem solving pertaining to times per day, however functionally required mod A verbal cues while utilizing pill organizer. All questions answered to satisfaction pertaining to medication management. Pt's husband expressed desire for hired CNA to assist with medication and hearing aid placement. Pt was left in room with husband, call bell within reach andchair alarm set. ST recommends to continue skilled ST services.      Pain Pain Assessment Pain Score: 0-No pain  Therapy/Group: Individual Therapy  [name]  [facility] 08/21/2019, 12:34 PM

## 2019-08-21 NOTE — Plan of Care (Signed)
  Problem: RH Problem Solving Goal: LTG Patient will demonstrate problem solving for (SLP) Description: LTG:  Patient will demonstrate problem solving for basic/complex daily situations with cues  (SLP) Flowsheets (Taken 08/21/2019 0700) LTG: Patient will demonstrate problem solving for (SLP): Basic daily situations LTG Patient will demonstrate problem solving for: Moderate Assistance - Patient 50 - 74% Note: Downgraded d/t lack of progress   Problem: RH Memory Goal: LTG Patient will use memory compensatory aids to (SLP) Description: LTG:  Patient will use memory compensatory aids to recall biographical/new, daily complex information with cues (SLP) Flowsheets (Taken 08/21/2019 0700) LTG: Patient will use memory compensatory aids to (SLP): Moderate Assistance - Patient 50 - 74% Note: Downgraded d/t lack of progress   Problem: RH Attention Goal: LTG Patient will demonstrate this level of attention during functional activites (SLP) Description: LTG:  Patient will will demonstrate this level of attention during functional activites (SLP) Flowsheets (Taken 08/21/2019 0700) Patient will demonstrate during cognitive/linguistic activities the attention type of: Selective Patient will demonstrate this level of attention during cognitive/linguistic activities in: Controlled LTG: Patient will demonstrate this level of attention during cognitive/linguistic activities with assistance of (SLP): Minimal Assistance - Patient > 75% Number of minutes patient will demonstrate attention during cognitive/linguistic activities: 15 Note: Downgraded d/t lack of progress

## 2019-08-21 NOTE — Plan of Care (Signed)
  Problem: Consults Goal: RH STROKE PATIENT EDUCATION Description: See Patient Education module for education specifics  Outcome: Progressing Goal: Nutrition Consult-if indicated Outcome: Progressing Goal: Diabetes Guidelines if Diabetic/Glucose > 140 Description: If diabetic or lab glucose is > 140 mg/dl - Initiate Diabetes/Hyperglycemia Guidelines & Document Interventions  Outcome: Progressing   Problem: RH BOWEL ELIMINATION Goal: RH STG MANAGE BOWEL WITH ASSISTANCE Description: STG Manage Bowel with mod Assistance. Outcome: Progressing Goal: RH STG MANAGE BOWEL W/MEDICATION W/ASSISTANCE Description: STG Manage Bowel with Medication with mod I/supervision Assistance. Outcome: Progressing   Problem: RH BLADDER ELIMINATION Goal: RH STG MANAGE BLADDER WITH ASSISTANCE Description: STG Manage Bladder With mod Assistance Outcome: Progressing   Problem: RH SKIN INTEGRITY Goal: RH STG SKIN FREE OF INFECTION/BREAKDOWN Description: Pt has stage 2 to buttocks. Will be staged 1 or improved prior to DC with mod assist Outcome: Progressing   Problem: RH SAFETY Goal: RH STG ADHERE TO SAFETY PRECAUTIONS W/ASSISTANCE/DEVICE Description: STG Adhere to Safety Precautions With mod Assistance/Device. Outcome: Progressing Goal: RH STG DECREASED RISK OF FALL WITH ASSISTANCE Description: STG Decreased Risk of Fall With reminders/cues Assistance. Outcome: Progressing   Problem: RH COGNITION-NURSING Goal: RH STG USES MEMORY AIDS/STRATEGIES W/ASSIST TO PROBLEM SOLVE Description: STG Uses Memory Aids/Strategies With min Assistance to Problem Solve. Outcome: Progressing Goal: RH STG ANTICIPATES NEEDS/CALLS FOR ASSIST W/ASSIST/CUES Description: STG Anticipates Needs/Calls for Assist With min Assistance/Cues. Outcome: Progressing   Problem: RH PAIN MANAGEMENT Goal: RH STG PAIN MANAGED AT OR BELOW PT'S PAIN GOAL Description: Less than 3 on 0-10 scale Outcome: Progressing   Problem: RH KNOWLEDGE  DEFICIT Goal: RH STG INCREASE KNOWLEDGE OF DIABETES Description: Pt and husband will be able to verbalize 2 ways to manage DM with medication and diet regimen with min assist Outcome: Progressing Goal: RH STG INCREASE KNOWLEDGE OF HYPERTENSION Description: Pt and husband will be able to verbalize 2 ways to manage HTN with medication and diet regimen with min assist Outcome: Progressing Goal: RH STG INCREASE KNOWLEDGE OF STROKE PROPHYLAXIS Description: Pt and husband will be able to verbalize 2 ways to manage stroke prophylaxis with medication and diet regimen with min assist Outcome: Progressing   Problem: RH SKIN INTEGRITY Goal: RH STG MAINTAIN SKIN INTEGRITY WITH ASSISTANCE Description: STG Maintain Skin Integrity With mod Assistance. Outcome: Not Progressing Goal: RH STG ABLE TO PERFORM INCISION/WOUND CARE W/ASSISTANCE Description: STG Able To Perform Incision/Wound Care With mod Assistance. Outcome: Not Progressing

## 2019-08-22 ENCOUNTER — Inpatient Hospital Stay (HOSPITAL_COMMUNITY): Payer: Medicare HMO | Admitting: Speech Pathology

## 2019-08-22 ENCOUNTER — Inpatient Hospital Stay (HOSPITAL_COMMUNITY): Payer: Medicare HMO | Admitting: Occupational Therapy

## 2019-08-22 ENCOUNTER — Inpatient Hospital Stay (HOSPITAL_COMMUNITY): Payer: Medicare HMO

## 2019-08-22 ENCOUNTER — Inpatient Hospital Stay (HOSPITAL_COMMUNITY): Payer: Medicare HMO | Admitting: Physical Therapy

## 2019-08-22 LAB — BASIC METABOLIC PANEL
Anion gap: 11 (ref 5–15)
BUN: 61 mg/dL — ABNORMAL HIGH (ref 8–23)
CO2: 26 mmol/L (ref 22–32)
Calcium: 9.2 mg/dL (ref 8.9–10.3)
Chloride: 94 mmol/L — ABNORMAL LOW (ref 98–111)
Creatinine, Ser: 3.55 mg/dL — ABNORMAL HIGH (ref 0.44–1.00)
GFR calc Af Amer: 14 mL/min — ABNORMAL LOW (ref >60)
GFR calc non Af Amer: 12 mL/min — ABNORMAL LOW (ref >60)
Glucose, Bld: 197 mg/dL — ABNORMAL HIGH (ref 70–99)
Potassium: 4.7 mmol/L (ref 3.5–5.1)
Sodium: 131 mmol/L — ABNORMAL LOW (ref 135–145)

## 2019-08-22 LAB — GLUCOSE, CAPILLARY
Glucose-Capillary: 108 mg/dL — ABNORMAL HIGH (ref 70–99)
Glucose-Capillary: 138 mg/dL — ABNORMAL HIGH (ref 70–99)
Glucose-Capillary: 169 mg/dL — ABNORMAL HIGH (ref 70–99)
Glucose-Capillary: 111 mg/dL — ABNORMAL HIGH (ref 70–99)

## 2019-08-22 MED ORDER — PANTOPRAZOLE SODIUM 40 MG PO TBEC: 40.0000 mg | DELAYED_RELEASE_TABLET | Freq: Two times a day (BID) | ORAL | 0 refills | Status: DC

## 2019-08-22 MED ORDER — FOLIC ACID 1 MG PO TABS: 1.0000 mg | ORAL_TABLET | Freq: Every day | ORAL | 0 refills | Status: DC

## 2019-08-22 MED ORDER — CYANOCOBALAMIN 500 MCG PO TABS: 500.0000 ug | ORAL_TABLET | Freq: Every day | ORAL | 0 refills | Status: DC

## 2019-08-22 MED ORDER — MIDODRINE HCL 10 MG PO TABS: 10.0000 mg | ORAL_TABLET | ORAL | 0 refills | Status: DC

## 2019-08-22 MED ORDER — MICONAZOLE NITRATE POWD: 1.0000 g | Freq: Two times a day (BID) | 0 refills | Status: AC

## 2019-08-22 MED ORDER — ALPRAZOLAM 0.25 MG PO TABS: 0.2500 mg | ORAL_TABLET | Freq: Every evening | ORAL | 0 refills | Status: DC | PRN

## 2019-08-22 MED ORDER — RENA-VITE PO TABS: 1.0000 | ORAL_TABLET | Freq: Every day | ORAL | 0 refills | Status: DC

## 2019-08-22 MED ORDER — SACCHAROMYCES BOULARDII 250 MG PO CAPS: 250.0000 mg | ORAL_CAPSULE | Freq: Two times a day (BID) | ORAL | 0 refills | Status: DC

## 2019-08-22 MED ORDER — ACETAMINOPHEN 325 MG PO TABS: 650.0000 mg | ORAL_TABLET | ORAL | Status: AC | PRN

## 2019-08-22 MED ORDER — ROSUVASTATIN CALCIUM 20 MG PO TABS: 20.0000 mg | ORAL_TABLET | Freq: Every day | ORAL | 0 refills | Status: DC

## 2019-08-22 NOTE — Progress Notes (Signed)
[facility] PHYSICAL MEDICINE & REHABILITATION PROGRESS NOTE   Subjective/Complaints:   Poor appetite  Pt without new issues     ROS: Patient deniesCP, SOB, N/V/D   Objective:   No results found. Recent Labs    08/21/19 0604  HGB 10.3*  HCT 33.0*   No results for input(s): NA, K, CL, CO2, GLUCOSE, BUN, CREATININE, CALCIUM in the last 72 hours.  Intake/Output Summary (Last 24 hours) at 08/22/2019 0847 Last data filed at 08/22/2019 0434 Gross per 24 hour  Intake 0 ml  Output 1675 ml  Net -1675 ml     Physical Exam: Vital Signs Blood pressure (!) 104/57, pulse 72, temperature (!) 97.5 F (36.4 C), resp. rate 18, height 5' 8"  (1.727 m), weight 55 kg, SpO2 98 %. Constitutional: No distress . Vital signs reviewed. Husband at bedside; pt in bed HEENT: EOMI, oral membranes moist Neck: supple Cardiovascular: RRR with murmur. No JVD    Respiratory: CTA Bilaterally without wheezes or rales. Normal effort    GI: BS +, non-tender, non-distended  Rectal- not visualized (in w/c) Skin: Warm and dry.  Intact. Psych: Normal mood.  Normal behavior. Musc: No edema in extremities.  No tenderness in extremities. Neurological: Alert Motor: RUE/RLE: 0/5 proximal distal,trace finger flexion  LUE: 4/5 proximal to distal LLE: 4-/5 proximal to distal  Assessment/Plan: 1. 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  Physiatrist is providing close team supervision and 24 hour management of active medical problems listed below.  Physiatrist and rehab team continue to assess barriers to discharge/monitor patient progress toward functional and medical goals  Care Tool:  Bathing    Body parts bathed by patient: Chest, Abdomen, Front perineal area, Right upper leg, Left upper leg, Face, Left lower leg   Body parts bathed by helper: Right lower leg, Buttocks, Left arm, Right arm Body  parts n/a: Front perineal area, Buttocks, Right upper leg, Left lower leg, Right lower leg, Left upper leg(did not attempt)   Bathing assist Assist Level: Moderate Assistance - Patient 50 - 74%     Upper Body Dressing/Undressing Upper body dressing   What is the patient wearing?: Pull over shirt    Upper body assist Assist Level: Moderate Assistance - Patient 50 - 74%    Lower Body Dressing/Undressing Lower body dressing      What is the patient wearing?: Incontinence brief, Pants     Lower body assist Assist for lower body dressing: Maximal Assistance - Patient 25 - 49%     Toileting Toileting    Toileting assist Assist for toileting: Dependent - Patient 0%     Transfers Chair/bed transfer  Transfers assist     Chair/bed transfer assist level: 2 Helpers     Locomotion Ambulation   Ambulation assist   Ambulation activity did not occur: Safety/medical concerns(poor postural control and decreased standing ability)          Walk 10 feet activity   Assist  Walk 10 feet activity did not occur: Safety/medical concerns        Walk 50 feet activity   Assist Walk 50 feet with 2 turns activity did not occur: Safety/medical concerns         Walk 150 feet activity   Assist Walk 150 feet activity did not occur: Safety/medical concerns         Walk 10 feet on uneven surface  activity   Assist  Walk 10 feet on uneven surfaces activity did not occur: Safety/medical concerns         Wheelchair     Assist Will patient use wheelchair at discharge?: Yes Type of Wheelchair: Manual    Wheelchair assist level: Minimal Assistance - Patient > 75% Max wheelchair distance: 116f    Wheelchair 50 feet with 2 turns activity    Assist        Assist Level: Minimal Assistance - Patient > 75%   Wheelchair 150 feet activity     Assist      Assist Level: Minimal Assistance - Patient > 75%   Blood pressure (!) 104/57, pulse 72,  temperature (!) 97.5 F (36.4 C), resp. rate 18, height 5' 8"  (1.727 m), weight 55 kg, SpO2 98 %.  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 Antithrombotics: -DVT/anticoagulation:SCDs -antiplatelet therapy: Aspirin 81 mg daily and Plavix 75 mg daily x3 weeks then aspirin alone 3. Pain Management:Tylenol as needed 4. Mood:Xanax 1 mg nightly held for lethargy which has improved  - resumed at bedtime prn at lower dose  -antipsychotic agents: N/A 5. Neuropsych: This patientiscapable of making decisions on herown behalf. 6. Skin/Wound Care:Routine skin checks -local skin care for abrasions -nutrition discussed  -benadryl prn for itching  -sarna lotion prn 7. Fluids/Electrolytes/Nutrition:Routine and outs 8.  ESRD. Hemodialysis per renal services. HD after therapy day to allow for better therapy participation 9. Orthostasis. Continue ProAmatine 10 mg Monday Wednesday Friday   Vitals:   08/21/19 2112 08/22/19 0424  BP: (!) 105/56 (!) 104/57  Pulse: 70 72  Resp: 16 18  Temp: 98.2 F (36.8 C) (!) 97.5 F (36.4 C)  SpO2: 98% 98%  controlled 12/28 10. Hyperlipidemia. Crestor 11. Acute on chronic combined congestive heart failure. Monitor for any signs of fluid overload. Daily weights  TEE suggesting EF of 35% with mild MR, TR, AS Filed Weights   08/21/19 1215 08/21/19 1530 08/22/19 0424  Weight: 52.4 kg 51 kg 55 kg    Fluid balance as per Nephro 12. Diabetes mellitus type II with hyperglycemia, hemoglobin A1c 5.3.   CBG (last 3)  Recent Labs    08/21/19 1712 08/21/19 2114 08/22/19 0631  GLUCAP 150* 149* 108*  controlled , occ elevation    13. History of tobacco abuse. Counseling 14. CAD with CABG 2010. Continue aspirin. No chest pain reported. 15. IBS/history of GI bleed. hold Amitiza24 mcg daily - due to diarrhea  16.  Social son threatened to break through security to visit his mom  161  Chronic cystitis  Seen by urology, on no abx at present 18.  Diarrhea- C diff neg,was on  Amitiza took this every other day at home to avoid diarrhea   -being held for now  Prn antidiarrheal 19.  Vaginal d/c: appreciate GYN eval- endometrial mass will need f/u  20. Fever x 1 with leukocytosis, off vanc and Zosyn,and  augmentin --afebrile   Xanax DC'd- appears brighter but sleep is altered will restart low dose prn at night   Fluid status per Nephro given HD   Patient oliguric-ESRD    Chest x-ray ordered- no evidence of infiltrate 21.  ABLA GI bleed hx of same (diverticular per GI), Hgb responded to 2 U PRBC , appreciate GI consult --outpt  f/u with Dr. Collene Mares   Hgb stable at 7.2 no orthostasic symptoms  1/1: Received 2U PRBC Hgb 10.3 on 1/6       LOS: 26 days A FACE TO [place] E Mikya Don 08/22/2019, 8:47 AM

## 2019-08-22 NOTE — Discharge Summary (Signed)
Physician Discharge Summary  Patient ID: [name] MRN: [id number] DOB/AGE: 11-22-1940 79 y.o.  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 EXAM: CHEST - 2 VIEW COMPARISON:  None. FINDINGS: Stable cardiomegaly. The hila and mediastinum are unchanged. Vascular calcifications are noted. A double lumen central line terminates in the right side of the atrium, stable. No nodule, mass, infiltrate, or overt edema. IMPRESSION: No active cardiopulmonary disease. Stable double lumen central line terminating in the right side of the atrium. Electronically Signed   By: Dorise Bullion III M.D   On: 08/04/2019 12:08   DG Chest 2 View  Result Date: 07/31/2019 CLINICAL DATA:  Fever EXAM: CHEST - 2 VIEW COMPARISON:  July 23, 2019 FINDINGS: The heart size is stable from prior study. The patient is status post prior median sternotomy. The right-sided tunneled dialysis catheter is well positioned. There is no pneumothorax. No large pleural effusion. No focal infiltrate. IMPRESSION: Stable appearance of the chest.  No acute cardiopulmonary process. Electronically Signed   By: Constance Holster M.D.   On: 07/31/2019 22:52   DG Abd 1 View  Result Date: 08/07/2019 CLINICAL DATA:  Ileus, upper abdominal pain EXAM: ABDOMEN - 1 VIEW COMPARISON:  08/05/2011  FINDINGS: Mild gaseous distension of the colon with moderate stool burden. Gaseous distention slightly increased since prior study. No organomegaly or free air. Diffuse aortic atherosclerosis. No visible aneurysm. No acute bony abnormality. IMPRESSION: Diffuse gaseous distention of the colon with moderate stool burden. Gaseous distention slightly increased since prior study. Findings likely reflect ileus. Electronically Signed   By: Rolm Baptise M.D.   On: 08/07/2019 08:47   DG Abd 1 View  Result Date: 08/05/2019 CLINICAL DATA:  Ileus. EXAM: ABDOMEN - 1 VIEW COMPARISON:  08/04/2019.  CT 08/01/2019. FINDINGS: Persistent distention of small and large bowel loops, improved from prior exam. Findings consistent improving adynamic ileus. Pelvic calcifications most consistent with phleboliths. Aortoiliac and visceral atherosclerotic vascular calcification. Degenerative change lumbar spine and both hips. IMPRESSION: Persistent distention of small large bowel loops, improved from prior exam. Findings consistent with improving adynamic ileus. Electronically Signed   By: Marcello Moores  Register   On: 08/05/2019 06:19   DG Abd 1 View  Result Date: 08/04/2019 CLINICAL DATA:  Abdominal distension. EXAM: ABDOMEN - 1 VIEW COMPARISON:  August 01, 2019 FINDINGS: Mild prominence of large and small bowel suggest the possibility of developing ileus. Moderate fecal loading in the left colon. Evaluation for free air is limited on supine imaging but none is seen. Sign IMPRESSION: Air-filled prominent loops of large and small bowel suggests developing ileus. Recommend attention on follow-up. Moderate fecal loading. No other acute abnormalities. Electronically Signed   By: Dorise Bullion III M.D   On: 08/04/2019 13:41   CT RENAL STONE STUDY  Result Date: 08/01/2019 CLINICAL DATA:  Pyelonephritis. EXAM: CT ABDOMEN AND PELVIS WITHOUT CONTRAST TECHNIQUE: Multidetector CT imaging of the abdomen  and pelvis was performed following the  standard protocol without IV contrast. COMPARISON:  None. FINDINGS: Lower chest: No acute abnormality. Hepatobiliary: Normal liver. Small dependent stones in the gallbladder fundus. No wall thickening or inflammation. No bile duct dilation. Pancreas: Unremarkable. No pancreatic ductal dilatation or surrounding inflammatory changes. Spleen: Mild enlargement of the spleen, measuring 15 x 5 x 10 cm. No splenic mass or focal lesion. Adrenals/Urinary Tract: No adrenal masses. Bilateral renal cortical thinning/atrophy. No renal masses. No hydronephrosis. Ureters are normal in course and in caliber. Bladder wall is mildly thickened, with the serosal margin somewhat ill-defined with the adjacent fat shows hazy opacity. Tiny amount of nondependent air in the bladder. No mass or stone. Stomach/Bowel: Stomach is unremarkable. Small bowel is normal in caliber. No wall thickening. No inflammation. Mild to moderate generalized increased stool burden in the colon. No colonic wall thickening or inflammation. Normal appendix visualized. Rectal tube distends the rectum. Vascular/Lymphatic: Dense aortic atherosclerotic calcifications extending to the branch vessels. No enlarged lymph nodes. Reproductive: Apparent thickening of the endometrium versus endometrial fluid distending the endometrial canal. This widens the endometrium/canal to 27 mm. No adnexal masses. Other: No hernia or ascites. Musculoskeletal: No fracture or acute finding.  No bone lesion. IMPRESSION: 1. Bladder wall is thickened with adjacent inflammatory changes, findings consistent with cystitis. 2. Diffuse renal cortical thinning with decreased renal sizes. No hydronephrosis. No unenhanced CT evidence of pyelonephritis. 3. Thickened endometrium versus distended endometrial canal. Recommend follow-up pelvic ultrasound for further assessment. 4. Mild to moderate increased colonic stool burden. No bowel obstruction or inflammation. 5. Mild splenomegaly of unclear  etiology. 6. Extensive aortic atherosclerotic calcifications. Electronically Signed   By: Lajean Manes M.D.   On: 08/01/2019 20:03   ECHO TEE  Result Date: 08/02/2019   TRANSESOPHOGEAL ECHO REPORT   Patient Name:   CLEOTILDE DEGRAFF Date of Exam: 08/02/2019 Medical Rec #:  [id number]     Height:       68.0 in Accession #:    [id number]    Weight:       110.7 lb Date of Birth:  09-Jun-1941     BSA:          1.59 m Patient Age:    7 years      BP:           80/33 mmHg Patient Gender: F             HR:           99 bpm. Exam Location:  Inpatient  Procedure: 2D Echo and Transesophageal Echo                                 MODIFIED REPORT  This report was modified by Skeet Latch MD on 08/02/2019 due to IVC not                                      seen. Indications:     TIA 435.9/G45.9  History:         Patient has prior history of Echocardiogram examinations, most                  recent 07/23/2019. ESRD.  Sonographer:     Jaquita Folds Referring Phys:  [place] Phys: Skeet Latch MD  PROCEDURE: Consent was requested  emergently by emergency room physicain. The transesophogeal probe was passed through the esophogus of the patient. The patient's vital signs; including heart rate, blood pressure, and oxygen saturation; remained stable throughout the procedure. The patient developed no complications during the procedure. IMPRESSIONS  1. Left ventricular ejection fraction, by visual estimation, is 25 to 30%. The left ventricle has moderate to severely decreased function. There is no left ventricular hypertrophy.  2. The left ventricle demonstrates global hypokinesis.  3. Global right ventricle has normal systolic function.The right ventricular size is normal. No increase in right ventricular wall thickness.  4. Left atrial size was normal.  5. Right atrial size was normal.  6. The mitral valve is normal in structure. Mild mitral valve regurgitation. No evidence of mitral stenosis.  7.  The tricuspid valve is normal in structure. Tricuspid valve regurgitation is mild.  8. The aortic valve is abnormal. Aortic valve regurgitation is not visualized. No evidence of aortic valve sclerosis or stenosis.  9. Aortic valve area 2.0 cm^2 by planimetry. The left and non-coronary cusps are restricted. Functionally bicuspid. 10. The pulmonic valve was normal in structure. Pulmonic valve regurgitation is trivial. 11. Mild plaque invoving the descending aorta. FINDINGS  Left Ventricle: Left ventricular ejection fraction, by visual estimation, is 25 to 30%. The left ventricle has moderate to severely decreased function. The left ventricle demonstrates global hypokinesis. There is no left ventricular hypertrophy. Normal left atrial pressure. Right Ventricle: The right ventricular size is normal. No increase in right ventricular wall thickness. Global RV systolic function is has normal systolic function. Left Atrium: Left atrial size was normal in size. Right Atrium: Right atrial size was normal in size Pericardium: There is no evidence of pericardial effusion. Mitral Valve: The mitral valve is normal in structure. No evidence of mitral valve stenosis by observation. Mild mitral valve regurgitation. Tricuspid Valve: The tricuspid valve is normal in structure. Tricuspid valve regurgitation is mild. Aortic Valve: The aortic valve is abnormal. Aortic valve regurgitation is not visualized. The aortic valve is structurally normal, with no evidence of sclerosis or stenosis. Aortic valve mean gradient measures 6.0 mmHg. Aortic valve peak gradient measures 9.2 mmHg. Aortic valve area, by VTI measures 1.04 cm. Aortic valve area 2.0 cm^2 by planimetry. The left and non-coronary cusps are restricted. Functionally bicuspid. Pulmonic Valve: The pulmonic valve was normal in structure. Pulmonic valve regurgitation is trivial. Aorta: The aortic root, ascending aorta and aortic arch are all structurally normal, with no evidence of  dilitation or obstruction. There is mild, atheroma plaque involving the descending aorta. Shunts: Agitated saline contrast was given intravenously to evaluate for intracardiac shunting. Saline contrast bubble study was negative, with no evidence of any interatrial shunt. There is no evidence of a patent foramen ovale. No ventricular septal defect is seen or detected. There is no evidence of an atrial septal defect. No atrial level shunt detected by color flow Doppler.  LEFT VENTRICLE          Normals PLAX 2D LVOT diam:     1.90 cm  2.0 cm LVOT Area:     2.84 cm 3.14 cm2  AORTIC VALVE                    Normals AV Area (Vmax):    1.38 cm AV Area (Vmean):   1.11 cm     3.06 cm2 AV Area (VTI):     1.04 cm AV Vmax:           152.00  cm/s AV Vmean:          112.000 cm/s 77 cm/s AV VTI:            0.263 m      3.15 cm2 AV Peak Grad:      9.2 mmHg AV Mean Grad:      6.0 mmHg     3 mmHg LVOT Vmax:         74.20 cm/s LVOT Vmean:        43.900 cm/s  75 cm/s LVOT VTI:          0.096 m      25.3 cm LVOT/AV VTI ratio: 0.37         1 TRICUSPID VALVE             Normals TR Peak grad:   22.3 mmHg TR Vmax:        236.00 cm/s 288 cm/s  SHUNTS Systemic VTI:  0.10 m Systemic Diam: 1.90 cm  Skeet Latch MD Electronically signed by Skeet Latch MD Signature Date/Time: 08/02/2019/11:39:18 AM    Final (Updated)    CT Angio Abd/Pel w/ and/or w/o  Result Date: 08/09/2019 CLINICAL DATA:  79 year old female with bright red blood per rectum, suspect lower GI bleed. EXAM: CTA ABDOMEN AND PELVIS WITHOUT AND WITH CONTRAST TECHNIQUE: Multidetector CT imaging of the abdomen and pelvis was performed using the standard protocol during bolus administration of intravenous contrast. Multiplanar reconstructed images and MIPs were obtained and reviewed to evaluate the vascular anatomy. CONTRAST:  153mL OMNIPAQUE IOHEXOL 350 MG/ML SOLN COMPARISON:  CTA chest 10/07/2008; abdominal CTA 08/01/2019 FINDINGS: VASCULAR Aorta: Severe calcified  atherosclerotic plaque throughout the abdominal aorta. No evidence of aneurysm or dissection. Celiac: Heavily calcified atherosclerotic plaque results in mild to moderate stenosis at the origin of the celiac artery. SMA: Chronic long artery. Segment occlusion of the proximal superior mesenteric The artery reconstitutes via collateral flow through the pancreaticoduodenal arcade. Renals: Severe heavy calcification of the solitary right renal artery likely resulting in significant stenosis. There are multiple small left-sided renal arteries, at least 3. Calcifications are present at the artery origins. Degree of stenosis difficult to assess given small vessel size. IMA: Chronically occluded at the origin. The distal branches reconstitute. Inflow: Heavy atherosclerotic plaque in the bilateral common iliac arteries resulting in moderate to high-grade stenosis on the left, and mild to moderate stenosis on the right. The left internal iliac artery is occluded at the origin. The right internal iliac artery is patent. The external iliac arteries are relatively spared from disease. Proximal Outflow: No acute abnormality. Veins: No obvious venous abnormality within the limitations of this arterial phase study. Review of the MIP images confirms the above findings. NON-VASCULAR Lower chest: Small bilateral pleural effusions and associated atelectasis. Partially peripherally calcified circumscribed low-attenuation cystic juxta cardiac mass measuring 3.0 x 5.5 x 2.4 cm affiliated with the inferior aspect of the pericardium. This abnormality was not present on prior imaging from 2010. There is mild local mass effect on the right ventricle. The heart is normal in size. No pericardial effusion. Patient is status post median sternotomy. Hepatobiliary: Mildly nodular liver contour. Findings suggest underlying cirrhosis. No discrete hepatic lesion. No gallstones. The gallbladder is decompressed. However, there is gallbladder wall  thickening. Pancreas: Unremarkable. No pancreatic ductal dilatation or surrounding inflammatory changes. Spleen: Mild splenomegaly. Adrenals/Urinary Tract: Unremarkable adrenal glands. Bilateral renal atrophy. Punctate parenchymal calcification in the interpolar right kidney. Small circumscribed low-attenuation lesions bilaterally are too small for accurate characterization  but statistically likely cysts. No evidence of hydronephrosis. Unremarkable ureters. A Foley catheter is present in the bladder. Stomach/Bowel: Hyperenhancement and thickening of the mucosa of the rectum particularly along the posterior wall. The enhancement is visible on the venous phase images but not the arterial phase images. There is some mild perirectal interstitial stranding in the presacral space. There is no evidence of active arterial bleeding. Normal appendix. Lymphatic: No suspicious lymphadenopathy. Reproductive: Abnormal. The endometrial canal is significantly expanded up to 18 mm and is filled with intermediate attenuation fluid. No adnexal masses. Other: Trace perihepatic ascites. Musculoskeletal: No acute or significant osseous findings. Multilevel degenerative disc disease most significant at L5-S1. IMPRESSION: VASCULAR 1. No evidence of active arterial bleeding. 2. Severe and heavily calcified atherosclerotic disease involving nearly all vascular beds including the aorta. Aortic Atherosclerosis (ICD10-170.0) 3. 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 4. 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 5. High-grade stenosis of the left common iliac artery. 6. High-grade stenosis of the right renal artery. 7. Multiple small left-sided renal arteries. NON-VASCULAR 1. Hyperenhancement and thickening of the mucosa of the rectum, particularly along the posterior wall. There is some associated inflammatory stranding in the presacral space as well. Primary differential considerations include  proctitis versus rectal neoplasm. Hemorrhoids are also a possibility but would not account for the associated inflammatory stranding. This abnormality likely represents the source of the patient's hematochezia. 2. Diffuse thickening of the endometrium with fluid in the endometrial canal. This is an abnormal finding in a postmenopausal female. Differential considerations include endometrial neoplasm, hyperplasia or polyps. Recommend non emergent referral to OBGYN for endometrial biopsy. 3. Circumscribed juxta cardiac intra pericardial fluid collection in the inferior aspect of the pericardial space resulting in mild mass effect on the inferior aspect of the right ventricle. This abnormality was not present in 2010 but the patient has since undergone median sternotomy. This is favored to represent sequelae of a prior hematoma. 4. Hepatic cirrhosis with splenomegaly and trace perihepatic ascites consistent with portal hypertension. 5. Small bilateral pleural effusions and associated atelectasis. 6. Additional ancillary findings as above. Electronically Signed   By: Jacqulynn Cadet M.D.   On: 08/09/2019 08:52    Labs:  Basic Metabolic Panel: Recent Labs  Lab 08/16/19 1837 08/19/19 0553 08/21/19 1730 08/22/19 0837  NA 135 133*  --  131*  K 3.0* 4.3  --  4.7  CL 98 96*  --  94*  CO2 25 22  --  26  GLUCOSE 128* 127*  --  197*  BUN 19 93*  --  61*  CREATININE 1.82* 5.49*  --  3.55*  CALCIUM 8.7* 9.0  --  9.2  PHOS 1.5*  --  3.1  --     CBC: Recent Labs  Lab 08/16/19 1837 08/17/19 0400 08/19/19 0553 08/21/19 0604  WBC 7.8  --   --   --   NEUTROABS 5.7  --   --   --   HGB 11.6* 10.9* 11.1* 10.3*  HCT 35.5* 33.8* 35.2* 33.0*  MCV 93.2  --   --   --   PLT 122*  --   --   --     CBG: Recent Labs  Lab 08/21/19 2114 08/22/19 0631 08/22/19 1218 08/22/19 1638 08/22/19 2111  GLUCAP 149* 108* 138* 169* 111*   Family history.  Unknown  Brief HPI:   [name] is a 79 y.o.  right-handed female with history of hypertension, hyperlipidemia,  diabetes mellitus, GI bleed with IBS, congestive diastolic heart failure, end-stage renal disease with hemodialysis Monday Wednesday Fridays, CAD with CABG 2010, aortic stenosis, history of right thalamic CVA 2018, tobacco abuse.  Per chart review lives with spouse 1 level home.  She did use a Rollator and walker when going to dialysis.  Presented 07/23/2019 with right-sided weakness.  Cranial CT scan showed no intracranial hemorrhage.  Chronic small vessel ischemia and generalized volume loss noted.  Patient did not receive TPA.  CT angiogram negative for large vessel occlusion however positive for significant intracranial and extracranial atherosclerotic disease.  MRI of the brain areas of acute infarction throughout the distal left anterior cerebral artery territory.  Echocardiogram with ejection fraction of 30% without emboli however it did show at the right atrium with oscillating density consideration vegetation versus thrombus discussed with neurology services with with plan for TEE.  Admission chemistries with creatinine 4.26, SARS coronavirus negative, hemoglobin 12.6, platelet 125,000.  Neurology follow-up advised aspirin Plavix for CVA prophylaxis x3 weeks and aspirin alone.  Hemodialysis ongoing as per renal services.  Patient was admitted for a comprehensive rehab program.   Hospital Course: ELAINEY MATOTT was admitted to rehab 07/27/2019 for inpatient therapies to consist of PT, ST and OT at least three hours five days a week. Past admission physiatrist, therapy team and rehab RN have worked together to provide customized collaborative inpatient rehab.  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  She was attending therapies with slow progressive gains.  Palliative care was consulted to establish goals of care.  Aspirin and Plavix for 3 weeks for  stroke prophylaxis then aspirin alone.  Patient did receive TEE for ongoing monitoring after recent echocardiogram showing no thrombus or mass no PFO noted 08/02/2019.  She was using only Tylenol for pain.  Mood stabilization with low-dose Xanax.  Patient remained on hemodialysis as per renal services.  Close monitoring of blood pressure with bouts of orthostasis she did continue on ProAmatine on days of dialysis.  Acute on chronic combined congestive heart failure monitoring for any signs of fluid overload echocardiogram as documented.  Blood sugars monitored hemoglobin A1c 5.3.  She continued on Crestor for hyperlipidemia.  Patient noted history of CAD with CABG 2010 she continued on aspirin therapy.  There is also noted history of tobacco abuse for which patient did receive counseling on she had no request for nicotine.  Noted chronic cystitis seen by urology no recommendations at this time for antibiotic therapies however a Foley catheter tube was recommended to remain in place.  Bouts of diarrhea C. difficile negative she had been on Amitiza at home this was currently held.  Bouts of vaginal discharge OB/GYN follow-up for question endometrial mass which she received follow-up as outpatient.  Acute blood loss anemia history of GI bleed she had been transfused 2 units during her hospital stay follow-up per [facility] team for ongoing assistance and monitoring with latest hemoglobin 11.1.   Blood pressures were monitored on TID basis and remained labile and monitored  Diabetes has been monitored with ac/hs CBG checks and SSI was use prn for tighter BS control.   Marlana Salvage has made gains during rehab stay and is attending therapies with some encouragement as well as multiple medical issues  She will continue to receive follow up therapies   after discharge  Rehab course: During patient's stay in rehab weekly team conferences were held  to monitor patient's progress, set goals and discuss barriers to  discharge. At admission, patient required total assist for rolling, max assist side-lying to sitting, total assist sit to supine, max assist stand pivot transfers, max assist +2 physical assist lateral scoot transfers, max assist upper body bathing max assist lower body bathing max assist upper body dressing total assist lower body dressing max assist toilet transfers   Physical exam.  Blood pressure 156/67 pulse 89 temperature 98.4 respirations 18 oxygen saturation 97% room air Constitutional.  Frail-appearing made eye contact with examiner provides her name and age 35 Head.  Normocephalic and atraumatic Eyes.  Pupils round and reactive to light EOMs normal Neck.  Supple nontender no tracheal deviation no thyromegaly Cardiovascular normal rate regular rhythm no friction rub or murmur heard Respiratory.  Effort normal breath sounds normal no respiratory distress no wheezes GI.  Soft exhibits no distention nontender Neurological.  Alert frail-appearing female speech was clear.  Mild right inattention.  No field cut.  Right upper extremity 0 out of 5 right lower extremity 0 out of 5 left upper extremity left lower extremity 4 - to 4 out of 5.  Senses pain and light touch.  Marlana Salvage  has had improvement in activity tolerance, balance, postural control as well as ability to compensate for deficits. Marlana Salvage has had improvement in functional use RUE/LUE  and RLE/LLE as well as improvement in awareness.  Working with energy conservation techniques.  Rolling right and x2 each with moderate assist to the right and max assist to the left to don pants and place lift to sling.  Hoyer lift transfer to wheelchair.  Wheelchair mobility through hall with supervision assist for hallway navigation and minimal assist for doorway management.  Moderate cues for awareness of door frame.  Sit to stand in parallel bars x2 with max assist and upper extremity support on left side.  Patient able to tolerate standing with right knee  block x7 seconds and 3 seconds before requiring rest break.  Worked on passive range of motion scapular glides and wrist extension.  Moderate assist upper body bathing max assist lower body bathing moderate assist upper body dressing max assist lower body dressing max assist toilet transfers.  Full family teaching completed patient was slow overall gains during rehab stay plans were made for discharge to home       Disposition: Discharge to home    Diet: Renal diet  Special Instructions: No driving smoking or alcohol  Continue hemodialysis as directed  Medications at discharge 1 Tylenol as needed 2 Xanax 0.25 mg p.o. nightly 3.  Aspirin 81 mg p.o. daily 4.  Folic acid 1 mg p.o. daily 5.  ProAmatine 10 mg p.o. Monday Wednesday Friday hemodialysis 6.  Protonix 40 mg p.o. twice daily 7.  Crestor 20 mg p.o. daily 8.  Florastor 250 mg p.o. twice daily 9.  Vitamin B12 500 mcg p.o. 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   Specialty: Physical Medicine and Rehabilitation Why: Office to call for appointment Contact information: [place] [address] [phone]        Roney Jaffe, MD Follow up.   Specialty: Nephrology Why: Call for appointment Contact information: [place] [address] [phone]  [name], MD Follow up.   Specialty: Gastroenterology Why: Call for appointment as needed Contact information: [address], [place] [address] [phone]        McKenzie, Candee Furbish, MD Follow up.   Specialty: Urology Why: As needed Contact information: [place] [address] [phone]        Chancy Milroy, MD Follow up.   Specialty: Obstetrics and Gynecology Why: Call for  appointment as needed Contact information: [place]  [address] [phone]           Signed: Lavon Paganini Sandoval 08/23/2019, 5:12 AM

## 2019-08-22 NOTE — Progress Notes (Signed)
Chaplain completed Advanced Directive paperwork with Mr. And Mrs. Lesh.  Chaplain left them two copies and made a copy for a nurse tech to input in Mrs. Wellens' chart.  The paperwork has been left on the movable table.   Chaplain will follow-up as needed.

## 2019-08-22 NOTE — Discharge Instructions (Signed)
Inpatient Rehab Discharge Instructions  [name] Discharge date and time: No discharge date for patient encounter.   Activities/Precautions/ Functional Status: Activity: activity as tolerated Diet: renal diet Wound Care: none needed Functional status:  ___ No restrictions     ___ Walk up steps independently ___ 24/7 supervision/assistance   ___ Walk up steps with assistance ___ Intermittent supervision/assistance  ___ Bathe/dress independently ___ Walk with walker     _x__ Bathe/dress with assistance ___ Walk Independently    ___ Shower independently ___ Walk with assistance    ___ Shower with assistance ___ No alcohol     ___ Return to work/school ________  Special Instructions: Continue hemodialysis as directed     COMMUNITY REFERRALS UPON DISCHARGE:    Home Health:   PT, OT, SP, RN, [place] Phone [phone]  Medical Equipment/Items Ordered  :[place]                                                                   Agency/Supplier: [facility]   [id number]    STROKE/TIA DISCHARGE INSTRUCTIONS SMOKING Cigarette smoking nearly doubles your risk of having a stroke & is the single most alterable risk factor  If you smoke or have smoked in the last 12 months, you are advised to quit smoking for your health.  Most of the excess cardiovascular risk related to smoking disappears within a year of stopping.  Ask you doctor about anti-smoking medications  [place] Quit Line: 1-800-QUIT NOW  Free Smoking Cessation Classes [phone]  CHOLESTEROL Know your levels; limit fat & cholesterol in your diet  Lipid Panel     Component Value Date/Time   CHOL 59 07/23/2019 0420   CHOL 110 06/26/2018 0936   TRIG 74 07/23/2019 0420   HDL 28 (L) 07/23/2019 0420   HDL 59 06/26/2018 0936   CHOLHDL 2.1 07/23/2019 0420   VLDL 15 07/23/2019 0420   LDLCALC 16 07/23/2019 0420   LDLCALC 26  06/26/2018 0936      Many patients benefit from treatment even if their cholesterol is at goal.  Goal: Total Cholesterol (CHOL) less than 160  Goal:  Triglycerides (TRIG) less than 150  Goal:  HDL greater than 40  Goal:  LDL (LDLCALC) less than 100   BLOOD PRESSURE American Stroke Association blood pressure target is less that 120/80 mm/Hg  Your discharge blood pressure is:  BP: 140/71  Monitor your blood pressure  Limit your salt and alcohol intake  Many individuals will require more than one medication for high blood pressure  DIABETES (A1c is a blood sugar average for last 3 months) Goal HGBA1c is under 7% (HBGA1c is blood sugar average for last 3 months)  Diabetes:    Lab Results  Component Value Date   HGBA1C 5.3 07/23/2019     Your HGBA1c can be lowered with medications, healthy diet, and exercise.  Check your blood sugar as directed by your physician  Call your physician if you experience unexplained or low blood sugars.  PHYSICAL ACTIVITY/REHABILITATION Goal is 30 minutes at least  4 days per week  Activity: Increase activity slowly, Therapies: Physical Therapy: Home Health Return to work:   Activity decreases your risk of heart attack and stroke and makes your heart stronger.  It helps control your weight and blood pressure; helps you relax and can improve your mood.  Participate in a regular exercise program.  Talk with your doctor about the best form of exercise for you (dancing, walking, swimming, cycling).  DIET/WEIGHT Goal is to maintain a healthy weight  Your discharge diet is:  Diet Order            Diet renal/carb modified with fluid restriction Diet-HS Snack? Nothing; Fluid restriction: 1200 mL Fluid; Room service appropriate? Yes; Fluid consistency: Thin  Diet effective now              liquids Your height is:  Height: 5\' 8"  (172.7 cm) Your current weight is: Weight: 51 kg Your Body Mass Index (BMI) is:  BMI (Calculated): 17.1  Following the  type of diet specifically designed for you will help prevent another stroke.  Your goal weight range is:    Your goal Body Mass Index (BMI) is 19-24.  Healthy food habits can help reduce 3 risk factors for stroke:  High cholesterol, hypertension, and excess weight.  RESOURCES Stroke/Support Group:  Call [phone]   STROKE EDUCATION PROVIDED/REVIEWED AND GIVEN TO PATIENT Stroke warning signs and symptoms How to activate emergency medical system (call 911). Medications prescribed at discharge. Need for follow-up after discharge. Personal risk factors for stroke. Pneumonia vaccine given:  Flu vaccine given:  My questions have been answered, the writing is legible, and I understand these instructions.  I will adhere to these goals & educational materials that have been provided to me after my discharge from the hospital.      My questions have been answered and I understand these instructions. I will adhere to these goals and the provided educational materials after my discharge from the hospital.  Patient/Caregiver Signature _______________________________ Date __________  Clinician Signature _______________________________________ Date __________  Please bring this form and your medication list with you to all your follow-up doctor's appointments.

## 2019-08-22 NOTE — Progress Notes (Signed)
Physical Therapy Session Note  Patient Details  Name: [name] MRN: [id number] Date of Birth: 12-Feb-1941  Today's Date: 08/22/2019 PT Individual Time: 1108-1205 PT Individual Time Calculation (min): 57 min   Short Term Goals: Week 4:  PT Short Term Goal 1 (Week 4): = to LTGs based on ELOS  Skilled Therapeutic Interventions/Progress Updates:    Pt received supine in bed and agreeable to therapy session. Performed R LE PROM into hip/knee flexion/extension for pain management as pt reports it becomes achy after resting in same position. Supine>sit with pt using L UE for trunk upright requiring mod assist for R LE management and to complete trunk upright. Pt able to maintain static sitting EOB using L UE support on bedrail and able to progress to L UE support in lap all with close supervision for safety. Progressed to performing dynamic sitting balance task of reaching with L UE towards external target with pt having slow R lateral trunk LOB requiring min assist for recovery with manual facilitation and cuing for returning to midline. Performed repeated sitting elevated EOB<>minisquat position with max assist for lifting, trunk control, and balance - therapist blocking R knee buckling and pt demonstrating ability to initiate lifting hips from bed x8reps. Pt progressively becoming fatigued requiring return to supine for rest break - with mod assist for B LE management and pt able to use L UE support for trunk descent. In supine, performed bridging x10reps with therapist assisting R LE positioning and pt able to lift hips minimally from bed but requires assist to prevent R knee from falling in/out. Returned to sitting EOB as described above. L lateral scoot transfer EOB>power w/c using beasy board with max assist for lifting hips from EOB while 2nd person placed beasy board - pt able to assist with sliding across board with max assist for scooting, trunk control, and B LE management. Dependent transport  to/from gym in power w/c. Pt continues to report that she does not feel comfortable driving power w/c and is in agreement with receiving manual TIS w/c. Performed sit<>stand to/from power w/c with +2 max assist for coming to standing - pt demonstrates significant fear of falling while rising to standing but once in standing position is able to calm down and use mirror feedback to improve upright trunk posture again with continued +2 max assist - multimodal cuing for increased upright posture via increased hip extension and R knee extension (therapist blocking buckling) - performed 2 reps with pt tolerating standing position for ~32minutes each time. Returned to room and pt left sitting up in power w/c with needs in reach and full lap tray donned in preparation for meal. RN notified of pt's position.   Therapy Documentation Precautions:  Precautions Precautions: Fall Precaution Comments: right hemiplegia Restrictions Weight Bearing Restrictions: No  Pain: Reports occasional pain/aches during functional mobility with therapist providing rest and repositioning for pain management.   Therapy/Group: Individual Therapy  Tawana Scale, PT, DPT 08/22/2019, 7:59 AM

## 2019-08-22 NOTE — Progress Notes (Signed)
Son called requesting to speak to the pts nurse. Son is demanding information on why the lift and bed has not been delivered to the house, and demanding the doctor provide the proper documentation for a handicap licence plate for the car instead of the mirror hanging tag. Pts son threatening to bring his lawyer to the hospital if this is not provided to him. Son stated " this is bull s**t and you all do not know what you are doing. You didn't even have a God D**n notary there when I was present. I wasted 1 1/2 hours of my day just for you all to tell me you couldn't help me. This is ridiculous, and yall are going to make me get real ugly."  Pts son also stating the pt feels "forgotten" and no one is taking care of her needs. This nurse personally went to check on the pt. Pt was repositioned in bed, denied pain or discomfort. So signs of distress noted. All needs had been met and pt denied the statements her son made.

## 2019-08-22 NOTE — Progress Notes (Signed)
Speech Language Pathology Daily Session Note  Patient Details  Name: SIMISOLA CHRISP MRN: [id number] Date of Birth: Mar 24, 1941  Today's Date: 08/22/2019 SLP Individual Time: [id number] SLP Individual Time Calculation (min): 30 min  Short Term Goals: Week 4: SLP Short Term Goal 1 (Week 4): STGs = LTGs d/t short remaining length of stay  Skilled Therapeutic Interventions: Pt was seen for skilled ST targeting cognition. SLP readministered portions of the cognistat to gauge pt's progress since admission. Per chart review, pt scored in severe range of impairments throughout problem solving and short term recall subtests. Today pt score WFL in short term recall, abstract reasoning, judgement, mild impairment in math calculations (problem solving), indicative of improvements. Pt still scored within severe range of impairment in visual construction subtest (semi-complex problem solving). Discussed areas of improvement and emphasized importance of 24/7 supervision for safety. Pt left sitting in wheelchair with seat alarm engaged and needs within reach. Continue per current plan of care.       Pain Pain Assessment Pain Scale: Faces Pain Score: 0-No pain Faces Pain Scale: No hurt  Therapy/Group: Individual Therapy  Arbutus Leas 08/22/2019, 1:37 PM

## 2019-08-22 NOTE — Progress Notes (Signed)
Physical Therapy Session Note  Patient Details  Name: [name] MRN: [id number] Date of Birth: August 26, 1940  Today's Date: 08/22/2019 PT Individual Time: 7473-4037 PT Individual Time Calculation (min): 28 min   Short Term Goals: Week 3:  PT Short Term Goal 1 (Week 3): Pt will perform power w/c mobility x150 ft with supervision PT Short Term Goal 1 - Progress (Week 3): Not met PT Short Term Goal 2 (Week 3): pt will use power w/c features in order to perform pressure relief with supervision PT Short Term Goal 2 - Progress (Week 3): Met Week 4:  PT Short Term Goal 1 (Week 4): = to LTGs based on ELOS  Skilled Therapeutic Interventions/Progress Updates:    Pt declined OOB or EOB activities due to just returning back to bed. Focused on supine therex and ROM for functional strengthening, muscle flexibility, and prevention of contracture (R) with active/active assisted and PROM for heel cord stretches, heel slides, hipabduction/adduction, and hip stretches x 10 reps each on BLE. Rolling with mod assist for funcational bed mobility retraining and positioned RUE on pillow for support and alignment prior to exiting.   Therapy Documentation Precautions:  Precautions Precautions: Fall Precaution Comments: right hemiplegia Restrictions Weight Bearing Restrictions: No Pain: Reports her bottom was hurting so just returned back to bed with nursing staff. Did not rate pain level.   Therapy/Group: Individual Therapy  Canary Brim Ivory Broad, PT, DPT, CBIS  08/22/2019, 2:33 PM

## 2019-08-22 NOTE — Progress Notes (Signed)
Occupational Therapy Session Note  Patient Details  Name: [name] MRN: [id number] Date of Birth: 1941-01-01  Today's Date: 08/22/2019 OT Individual Time: 0833-0900 OT Individual Time Calculation (min): 27 min    Short Term Goals: Week 1:  OT Short Term Goal 1 (Week 1): Pt will maintain sitting balance with min assist to prepare for ADL tasks OT Short Term Goal 1 - Progress (Week 1): Not met OT Short Term Goal 2 (Week 1): Pt will complete UB bathing with min assist OT Short Term Goal 2 - Progress (Week 1): Progressing toward goal OT Short Term Goal 3 (Week 1): Pt will complete LB bathing with max assist of 1 caregiver OT Short Term Goal 3 - Progress (Week 1): Not met OT Short Term Goal 4 (Week 1): Pt will complete toilet transfer with max assist of one caregiver OT Short Term Goal 4 - Progress (Week 1): Not met Week 2:  OT Short Term Goal 1 (Week 2): Pt will be able to sit EOB with mod A for balance to prepare for her ability to sit unsupported on a toilet. OT Short Term Goal 1 - Progress (Week 2): Met OT Short Term Goal 2 (Week 2): Pt will be able to use a slide board with mod A of 1 bed to w/c to prepare for her ability to do toilet transfers. OT Short Term Goal 2 - Progress (Week 2): Progressing toward goal OT Short Term Goal 3 (Week 2): Pt will demonstrate improved awareness of RUE with self ROM with min A and min cues. OT Short Term Goal 3 - Progress (Week 2): Met Week 3:  OT Short Term Goal 1 (Week 3): FAmily education to be completed with hoyer lift OT Short Term Goal 2 (Week 3): Slide board transfer bed to chair with therapy only with mod A +1  Skilled Therapeutic Interventions/Progress Updates:    Pt received in bed and slow to respond to questions.  Pt awake and looking at therapist but had to repeat question several times. Pt then stated her hearing aids had an echo to them but was not sure why.    She stated, "I am feeling very frustrated".  When asked why, she said she  was upset that none of her equipment of w/c or bed has been delivered to the house. Explained to pt that equipment is usually delivered the day before someone goes home and she is not leaving until Saturday.  From bed, worked on UB bathing and dressing with mod A and max cues to attend to R side.  Max to total A LB self care.  Focused on rolling and bridging in bed.  R wrist PROM, donned splint on hand.  Pillow under R arm to support arm.  Pt in bed with all needs met, alarm set.   Therapy Documentation Precautions:  Precautions Precautions: Fall Precaution Comments: right hemiplegia Restrictions Weight Bearing Restrictions: No  Pain: Pain Assessment Pain Score: 0-No pain ADL: ADL Eating: Set up Where Assessed-Eating: Chair Upper Body Bathing: Moderate assistance Where Assessed-Upper Body Bathing: Bed level Lower Body Bathing: Maximal assistance Where Assessed-Lower Body Bathing: Bed level Upper Body Dressing: Moderate assistance Where Assessed-Upper Body Dressing: Bed level Lower Body Dressing: Maximal assistance Where Assessed-Lower Body Dressing: Bed level Toilet Transfer: Maximal assistance(+2) Toilet Transfer Method: Stand pivot Toilet Transfer Equipment: Drop arm bedside commode  Therapy/Group: Individual Therapy  [place] 08/22/2019, 12:13 PM

## 2019-08-23 ENCOUNTER — Inpatient Hospital Stay (HOSPITAL_COMMUNITY): Payer: Medicare HMO | Admitting: Physical Therapy

## 2019-08-23 ENCOUNTER — Inpatient Hospital Stay (HOSPITAL_COMMUNITY): Payer: Medicare HMO | Admitting: Speech Pathology

## 2019-08-23 ENCOUNTER — Inpatient Hospital Stay (HOSPITAL_COMMUNITY): Payer: Medicare HMO | Admitting: Occupational Therapy

## 2019-08-23 DIAGNOSIS — Z66 Do not resuscitate: Secondary | ICD-10-CM

## 2019-08-23 DIAGNOSIS — Z7189 Other specified counseling: Secondary | ICD-10-CM

## 2019-08-23 DIAGNOSIS — Z515 Encounter for palliative care: Secondary | ICD-10-CM

## 2019-08-23 LAB — GLUCOSE, CAPILLARY
Glucose-Capillary: 136 mg/dL — ABNORMAL HIGH (ref 70–99)
Glucose-Capillary: 132 mg/dL — ABNORMAL HIGH (ref 70–99)
Glucose-Capillary: 105 mg/dL — ABNORMAL HIGH (ref 70–99)
Glucose-Capillary: 302 mg/dL — ABNORMAL HIGH (ref 70–99)

## 2019-08-23 LAB — RENAL FUNCTION PANEL
Albumin: 2.5 g/dL — ABNORMAL LOW (ref 3.5–5.0)
Anion gap: 12 (ref 5–15)
BUN: 97 mg/dL — ABNORMAL HIGH (ref 8–23)
CO2: 21 mmol/L — ABNORMAL LOW (ref 22–32)
Calcium: 8.7 mg/dL — ABNORMAL LOW (ref 8.9–10.3)
Chloride: 97 mmol/L — ABNORMAL LOW (ref 98–111)
Creatinine, Ser: 5.27 mg/dL — ABNORMAL HIGH (ref 0.44–1.00)
GFR calc Af Amer: 8 mL/min — ABNORMAL LOW (ref >60)
GFR calc non Af Amer: 7 mL/min — ABNORMAL LOW (ref >60)
Glucose, Bld: 287 mg/dL — ABNORMAL HIGH (ref 70–99)
Phosphorus: 4.6 mg/dL (ref 2.5–4.6)
Potassium: 4.8 mmol/L (ref 3.5–5.1)
Sodium: 130 mmol/L — ABNORMAL LOW (ref 135–145)

## 2019-08-23 LAB — CBC
HCT: 32.7 % — ABNORMAL LOW (ref 36.0–46.0)
Hemoglobin: 10.3 g/dL — ABNORMAL LOW (ref 12.0–15.0)
MCH: 31.1 pg (ref 26.0–34.0)
MCHC: 31.5 g/dL (ref 30.0–36.0)
MCV: 98.8 fL (ref 80.0–100.0)
Platelets: 120 10*3/uL — ABNORMAL LOW (ref 150–400)
RBC: 3.31 MIL/uL — ABNORMAL LOW (ref 3.87–5.11)
RDW: 22.9 % — ABNORMAL HIGH (ref 11.5–15.5)
WBC: 6.1 10*3/uL (ref 4.0–10.5)
nRBC: 0 % (ref 0.0–0.2)

## 2019-08-23 LAB — HEMOGLOBIN AND HEMATOCRIT, BLOOD
HCT: 35.1 % — ABNORMAL LOW (ref 36.0–46.0)
Hemoglobin: 11.1 g/dL — ABNORMAL LOW (ref 12.0–15.0)

## 2019-08-23 MED ORDER — HEPARIN SODIUM (PORCINE) 1000 UNIT/ML DIALYSIS: 1000.0000 [IU] | INTRAMUSCULAR | Status: DC | PRN

## 2019-08-23 MED ORDER — HEPARIN SODIUM (PORCINE) 1000 UNIT/ML DIALYSIS: 20.0000 [IU]/kg | INTRAMUSCULAR | Status: DC | PRN

## 2019-08-23 MED ORDER — SODIUM CHLORIDE 0.9 % IV SOLN: 100.0000 mL | INTRAVENOUS | Status: DC | PRN

## 2019-08-23 MED ORDER — PENTAFLUOROPROP-TETRAFLUOROETH EX AERO: 1.0000 "application " | INHALATION_SPRAY | CUTANEOUS | Status: DC | PRN

## 2019-08-23 MED ORDER — LIDOCAINE-PRILOCAINE 2.5-2.5 % EX CREA: 1.0000 "application " | TOPICAL_CREAM | CUTANEOUS | Status: DC | PRN

## 2019-08-23 MED ORDER — ALTEPLASE 2 MG IJ SOLR: 2.0000 mg | Freq: Once | INTRAMUSCULAR | Status: DC | PRN

## 2019-08-23 MED ORDER — HEPARIN SODIUM (PORCINE) 1000 UNIT/ML IJ SOLN
INTRAMUSCULAR | Status: AC
  Administered 2019-08-23: 1100 [IU] via INTRAVENOUS_CENTRAL
  Filled 2019-08-23: qty 2

## 2019-08-23 MED ORDER — HEPARIN SODIUM (PORCINE) 1000 UNIT/ML IJ SOLN
INTRAMUSCULAR | Status: AC
  Administered 2019-08-23: 3800 [IU] via INTRAVENOUS_CENTRAL
  Filled 2019-08-23: qty 3

## 2019-08-23 MED ORDER — HEPARIN SODIUM (PORCINE) 1000 UNIT/ML IJ SOLN
INTRAMUSCULAR | Status: AC
  Filled 2019-08-23: qty 1

## 2019-08-23 MED ORDER — LIDOCAINE HCL (PF) 1 % IJ SOLN: 5.0000 mL | INTRAMUSCULAR | Status: DC | PRN

## 2019-08-23 NOTE — Consult Note (Signed)
Consultation Note Date: 08/23/2019   Patient Name: [name]  DOB: 06-Jan-1941  MRN: [id number]  Age / Sex: 79 y.o., female  PCP: Jeanne Sacramento, MD Referring Physician: Charlett Blake, MD  Reason for Consultation: Establish Goals Of Care, Multiple medical issues  Per chart review had been seen by the Palliative Care team in October of 2019. At that time she was placed as a DNR/DNI.   HPI/Patient Profile:   From Hospitalist Notes --> [name] is a 79 y.o. right-handed female with history of hypertension, hyperlipidemia, diabetes mellitus, GI bleed with IBS, congestive diastolic heart failure, end-stage renal disease with hemodialysis Monday Wednesday Fridays, CAD with CABG 2010, aortic stenosis, history of right thalamic CVA 2018, tobacco abuse.  Per chart review lives with spouse 1 level home.  She did use a Rollator and walker when going to dialysis.  Presented 07/23/2019 with right-sided weakness.  Cranial CT scan showed no intracranial hemorrhage.  Chronic small vessel ischemia and generalized volume loss noted.  Patient did not receive TPA.  CT angiogram negative for large vessel occlusion however positive for significant intracranial and extracranial atherosclerotic disease.  MRI of the brain areas of acute infarction throughout the distal left anterior cerebral artery territory.  Echocardiogram with ejection fraction of 30% without emboli however it did show at the right atrium with oscillating density consideration vegetation versus thrombus discussed with neurology services with with plan for TEE.  Admission chemistries with creatinine 4.26, SARS coronavirus negative, hemoglobin 12.6, platelet 125,000.  Neurology follow-up advised aspirin Plavix for CVA prophylaxis x3 weeks and aspirin alone. Hemodialysis ongoing as per renal services.  Patient was admitted for a comprehensive rehab  program.  Clinical Assessment and Goals of Care: Palliative Team was asked to consult [name] about her goals of care in the setting of multiple medical problems. Met with [name] at bedside who shared with me the trials leading to her recent hospitalization.  She said that she had an idea of Palliative care was as she had heard of it before. I asked if she remembered speaking with our team in 2019 which she vaguely recalled. Went over what Palliative care is and how we assist patients. At this time we discussed her code status determinations which were to not get CPR or intubation. [name] shared that she still feels that she would not want those interventions. Her husband, [name] came to bedside during our conversation. Per [name] he agrees with whatever his wife wants for herself.   We took the opportunity to complete a MOST form whereby she would be DNR, allow for limited interventions such as hospitalization for IV abx and IVF, would not want a feeding tube.   We discussed HD and whether or not to continue it. At this time [name] says that she still enjoys life despite the reality that she was fiercly independent with her bADLs prior to this recent CVA. She recognizes that this has changed her life dramatically. Patient will have a caregiver at home 7 days out of  the week for 8 hours daily to help meet her needs.   [name] moved from [place] to [place] many years ago for her husbands job. She herself worked for [employer] for thirty four years, She has been married to her husband now for over 56 years. She gets enjoyment out of spending tie with him and her rescue miniature poodle, [name]. She does not have any grandchildren though feels that she lives a good life.  [name] shared that he was waiting for [name]'s DMEs to be delivered to the home. They still had not yet come. Spoke to SW [name] team about this. Primary team will provide handicap paperwork.  Patient and husband agree to arrangement of OP palliative  services to visit the home. Referral placed for CM  Plan for possible discharge tomorrow per primary team  Decision Maker: Patient herself is able to make decisions If patient unable to speak her husband, [name] would be her default decision maker.  SUMMARY OF RECOMMENDATIONs: MOST completed --> DNAR/DNI OP Palliative Care Referral TOC Team to help with DMEs and handicap paperwork  Code Status/Advance Care Planning:  DNR   Would want IV abx  Would want IVF  No feeding tube   Symptom Management:   No symptoms to manage at the present time  Palliative Prophylaxis:   Aspiration, Bowel Regimen, Delirium Protocol, Frequent Pain Assessment, Oral Care, Palliative Wound Care and Turn Reposition  Additional Recommendations (Limitations, Scope, Preferences):  Limited interventions  Psycho-social/Spiritual:   Desire for further Chaplaincy support: Yes, patient considers herself spiritual  Additional Recommendations: Caregiving  Support/Resources  Prognosis:   < 12 months  Discharge Planning: Home with Home Health, 8 hr of caregiver services daily, OP Palliative care referral     Primary Diagnoses: Present on Admission: . Small vessel cerebrovascular accident (CVA) ([name])  I have reviewed the medical record, interviewed the patient and family, and examined the patient. The following aspects are pertinent.  Past Medical History:  Diagnosis Date  . Anemia    Pt is taking iron.   [name] Anxiety   . Arthritis   . Carotid stenosis    40-59% bilateral ICA stenosis in 2/12.  . Chronic low back pain   . CKD (chronic kidney disease)    [name] at [facility]  . Coronary artery disease    Pt presented 2/10 to [facility] with NSTEMI and diastolic CHF exacerbation.  LHC was done  3/10 showing 99% pRCA stenosis and 80% calcified pLAD stenosis with L=>R collaterals.  Pt was referred  for CABG which was done by [name] with LIMA-LAD, SVG-RCA, SVG-OM.   . Diabetes mellitus   . Diabetic neuropathy ([place])   . Diastolic CHF ([facility])    Echo (2/10) showed EF 55-65%, mild LVH, diastolic dysfunction, mild AS with mean gradient 12 mmHg, PASP 43 mmHg.  Echo (2/12): EF 55-60%, mild LVH, mild AS (mean gradient 12), PA systolic pressure 32 mmHg.     [name] GERD (gastroesophageal reflux disease)   . Heart murmur   . Hyperlipidemia   . Hypertension   . Mild aortic stenosis    mean gradient 12 mmHg in 2/12.  . Myocardial infarction ([place])    "mild"  . Pneumonia   . PONV (postoperative nausea and vomiting)   . Stroke [facility])    " mild"  . Thrombocytopenia ([name])   . Unsteady gait    Social History   Socioeconomic History  . Marital status: Married    Spouse name: Linton Ham  .  Number of children: 1  . Years of education: Not on file  . Highest education level: Not on file  Occupational History  . Occupation: retired from office work  Tobacco Use  . Smoking status: Former Smoker    Types: Cigarettes  . Smokeless tobacco: Never Used  . Tobacco comment: quit 1988  Substance and Sexual Activity  . Alcohol use: Yes    Comment: daily  . Drug use: No  . Sexual activity: Not Currently    Partners: Male  Other Topics Concern  . Not on file  Social History Narrative  . Not on file   Social Determinants of Health   Financial Resource Strain:   . Difficulty of Paying Living Expenses: Not on file  Food Insecurity:   . Worried About Charity fundraiser in the Last Year: Not on file  . Ran Out of Food in the Last Year: Not on file  Transportation Needs:   . Lack of Transportation (Medical): Not on file  . Lack of Transportation (Non-Medical): Not on file  Physical Activity:   . Days of Exercise per Week: Not on file  . Minutes of Exercise per Session: Not on file  Stress:   . Feeling of Stress : Not on file  Social Connections:   . Frequency of Communication with Friends and Family: Not on file  . Frequency of Social Gatherings with Friends and  Family: Not on file  . Attends Religious Services: Not on file  . Active Member of Clubs or Organizations: Not on file  . Attends Archivist Meetings: Not on file  . Marital Status: Not on file  Married to [employer] for over 19 years Has one son Retired, worked at [employer] for 102 years Has a black minature poodle named [place] History  Adopted: Yes  Family history unknown: Yes   Scheduled Meds: . aspirin  81 mg Oral Daily  . carbamide peroxide  5 drop Both EARS BID  . Chlorhexidine Gluconate Cloth  6 each Topical Q0600  . darbepoetin (ARANESP) injection - DIALYSIS  40 mcg Intravenous Q Wed-HD  . feeding supplement (NEPRO CARB STEADY)  237 mL Oral TID BM  . feeding supplement (PRO-STAT SUGAR FREE 64)  30 mL Oral BID  . folic acid  1 mg Oral Daily  . Gerhardt's butt cream   Topical QID  . insulin aspart  0-9 Units Subcutaneous TID WC  . miconazole nitrate   Topical BID  . midodrine  10 mg Oral Q M,W,F-HD  . multivitamin  1 tablet Oral QHS  . pantoprazole  40 mg Oral BID  . rosuvastatin  20 mg Oral q1800  . saccharomyces boulardii  250 mg Oral BID  . vitamin B-12  500 mcg Oral Daily   Continuous Infusions: PRN Meds:.acetaminophen **OR** acetaminophen (TYLENOL) oral liquid 160 mg/5 mL **OR** acetaminophen, ALPRAZolam, camphor-menthol, diphenhydrAMINE, ondansetron, simethicone, sorbitol Medications Prior to Admission:  Prior to Admission medications   Medication Sig Start Date End Date Taking? Authorizing Provider  acetaminophen (TYLENOL) 325 MG tablet Take 2 tablets (650 mg total) by mouth every 4 (four) hours as needed for mild pain (or temp > 37.5 C (99.5 F)). 08/22/19   Angiulli, Lavon Paganini, PA-C  acetaminophen (TYLENOL) 500 MG tablet Take 1,000 mg by mouth 3 (three) times daily as needed for moderate pain.    [provider]  ALPRAZolam Duanne Moron) 0.25 MG tablet Take 1 tablet (0.25 mg total) by mouth at bedtime as needed for anxiety.  08/22/19   Angiulli, Lavon Paganini, PA-C   ALPRAZolam Duanne Moron) 1 MG tablet Take 1 mg by mouth at bedtime.  11/18/17   [provider]  AMITIZA 24 MCG capsule Take 24 mcg by mouth 2 (two) times daily as needed for constipation.  06/05/17   [provider]  aspirin EC 81 MG EC tablet Take 1 tablet (81 mg total) by mouth daily. 07/27/19   Florencia Reasons, MD  b complex-vitamin c-folic acid (NEPHRO-VITE) 0.8 MG TABS tablet Take 1 tablet by mouth daily. 05/01/18   [provider]  calcitRIOL (ROCALTROL) 0.5 MCG capsule Take 0.5 mcg by mouth daily. 04/09/18   [provider]  diphenhydrAMINE (BENADRYL) 25 MG tablet Take 25 mg by mouth daily as needed for allergies.    [provider]  folic acid (FOLVITE) 1 MG tablet Take 1 tablet (1 mg total) by mouth daily. 08/22/19   Angiulli, Lavon Paganini, PA-C  gabapentin (NEURONTIN) 100 MG capsule Take 100 mg by mouth at bedtime. 07/23/19   [provider]  insulin aspart (NOVOLOG) 100 UNIT/ML injection Inject 0-9 Units into the skin 3 (three) times daily with meals. 07/26/19   Florencia Reasons, MD  miconazole nitrate (MICATIN) POWD Apply 1 application topically 2 (two) times daily. 08/22/19   Angiulli, Lavon Paganini, PA-C  midodrine (PROAMATINE) 10 MG tablet Take 1 tablet (10 mg total) by mouth 3 (three) times a week. Patient takes on dialysis days, M,W,F 08/23/19   Angiulli, Lavon Paganini, PA-C  multivitamin (RENA-VIT) TABS tablet Take 1 tablet by mouth at bedtime. 08/22/19   Angiulli, Lavon Paganini, PA-C  pantoprazole (PROTONIX) 40 MG tablet Take 1 tablet (40 mg total) by mouth 2 (two) times daily. 08/22/19   Angiulli, Lavon Paganini, PA-C  rosuvastatin (CRESTOR) 20 MG tablet Take 1 tablet (20 mg total) by mouth daily at 6 PM. 08/22/19   Angiulli, Lavon Paganini, PA-C  rosuvastatin (CRESTOR) 5 MG tablet TAKE 1 TABLET BY MOUTH EVERYDAY AT BEDTIME Patient taking differently: Take 5 mg by mouth at bedtime.  06/27/19   [name], NP  saccharomyces boulardii (FLORASTOR) 250 MG capsule Take 1 capsule (250 mg  total) by mouth 2 (two) times daily. 08/22/19   Angiulli, Lavon Paganini, PA-C  sevelamer carbonate (RENVELA) 800 MG tablet Take 1,600 mg 3 (three) times daily with meals by mouth.  12/06/16   [provider]  vitamin B-12 (CYANOCOBALAMIN) 500 MCG tablet Take 1 tablet (500 mcg total) by mouth daily. 08/22/19   Angiulli, Lavon Paganini, PA-C   Allergies  Allergen Reactions  . Doxycycline Nausea And Vomiting    Caused "DEATHLY NAUSEA AND VOMITING"  . Lipitor [Atorvastatin] Other (See Comments)    Stomach pain  . Strawberry Extract Rash   Review of Systems  Constitutional: Negative.   HENT: Negative.   Eyes: Negative.   Respiratory: Negative.   Cardiovascular: Negative.   Gastrointestinal: Negative.   Endocrine: Negative.   Genitourinary: Negative.   Musculoskeletal: Positive for arthralgias and gait problem.  Skin: Negative.   Allergic/Immunologic: Negative.   Neurological: Positive for weakness.  Hematological: Negative.   Psychiatric/Behavioral: Negative.    Physical Exam Vitals and nursing note reviewed.  HENT:     Head: Normocephalic.     Nose: Nose normal.     Mouth/Throat:     Mouth: Mucous membranes are moist.  Eyes:     Pupils: Pupils are equal, round, and reactive to light.  Cardiovascular:     Rate and Rhythm: Normal rate.  Pulses: Normal pulses.  Pulmonary:     Effort: Pulmonary effort is normal.  Abdominal:     General: Bowel sounds are normal.  Musculoskeletal:     Cervical back: Normal range of motion.     Comments: RUE / RLE impaired   Skin:    General: Skin is warm.     Capillary Refill: Capillary refill takes less than 2 seconds.     Findings: Bruising present.  Neurological:     Mental Status: She is alert and oriented to person, place, and time.  Psychiatric:        Mood and Affect: Mood normal.     Vital Signs: BP 126/70 (BP Location: Left Arm)   Pulse 77   Temp 97.9 F (36.6 C)   Resp 18   Ht 5' 8"  (1.727 m)   Wt 54.7 kg   SpO2 96%   BMI  18.34 kg/m  Pain Scale: 0-10   Pain Score: 3   SpO2: SpO2: 96 % O2 Device:SpO2: 96 % O2 Flow Rate: .O2 Flow Rate (L/min): 3 L/min  IO: Intake/output summary:   Intake/Output Summary (Last 24 hours) at 08/23/2019 0745 Last data filed at 08/23/2019 0517 Gross per 24 hour  Intake 480 ml  Output 350 ml  Net 130 ml    LBM: Last BM Date: 08/22/19 Baseline Weight: Weight: 53 kg Most recent weight: Weight: 54.7 kg     Palliative Assessment/Data: 30%   Time In: 0745 Time Out: 0900 Time Total: 75 Greater than 50%  of this time was spent counseling and coordinating care related to the above assessment and plan.  Signed by: Rosezella Rumpf, NP   Please contact Palliative Medicine Team phone at [phone] for questions and concerns.  For individual provider: See Shea Evans

## 2019-08-23 NOTE — Progress Notes (Signed)
Physical Therapy Discharge Summary  Patient Details  Name: [name] MRN: [id number] Date of Birth: 1940/12/02  Today's Date: 08/23/2019 PT Individual Time: 1106-1205 PT Individual Time Calculation (min): 59 min    Patient has met 4 of 4 long term goals due to improved activity tolerance, improved balance, improved postural control, increased strength, ability to compensate for deficits, improved attention and improved awareness. Patient to discharge at a wheelchair level Max Assist to total assist. Patient's care partner attended hands-on family training and is independent to provide the necessary physical and cognitive assistance at discharge. Patient's family has also hired trained caregivers that will ensure patient has 24hr support at discharge.   All goals met.  Recommendation:  Patient will benefit from ongoing skilled PT services in home health setting to continue to advance safe functional mobility, address ongoing impairments in sitting balance, bed mobility, transfers, R UE and LE strength, R attention, generalized strength, activity tolerance, standing, and minimize fall risk and decrease caregiver burden.  Equipment: manual TIS wheelchair, hoyer lift, hospital bed  Reasons for discharge: treatment goals met and discharge from hospital  Patient/family agrees with progress made and goals achieved: Yes  Skilled Therapeutic Interventions/Progress Updates:  Pt received supine, asleep in bed and upon awakening agreeable to therapy session. Pt noted to be incontinent of small BM - pt reports not sleeping well last night due to frequent diarrhea. Rolling L with mod assist and rolling R with min assist while therapist performed total assist LB clothing management and peri-care - pt able to place L LE through pants and with assist for R LE positioning able to bridge with slight clearance to pull pants over hips. Supine>L sidelying>sitting EOB with mod assist as pt demonstrating  improving ability to use L UE to support trunk upright. Sitting EOB pt initially required mod assist due to posterior lean; however, with manual facilitation and repositioning of L UE support able to maintain static sitting balance with CGA. L lateral scoot transfer EOB>w/c using beasy board with max assist of 1 for lifting hips and +2 for placing board - able to slide across board with max assist of 1 for scooting hips, trunk control, and R LE management. Dependent power w/c mobility to/from therapy gym. Performed sit<>stand x2 at L hallway rail with heavy max assist of 1 for lifting into standing, blocking R knee buckling, and facilitation increased hip/knee extension and trunk upright - pt noted to move L foot laterally with possible pushing tendencies, cuing for narrowing BOS to improve standing position - cuing and manual facilitation for increased trunk/hip extension for upright posture.  Returned to room via dependent power w/c mobility. L lateral scoot transfer again as described as above with +2 present only to place beasy board as pt requires max assist with bilateral hands on assist from therapist while board is being place. Sit>supine with mod assist for B LE management. Pt left supine in bed with needs in reach and NT arriving to assume care of patient.   PT Discharge Precautions/Restrictions Precautions Precautions: Fall Precaution Comments: right hemiplegia Restrictions Weight Bearing Restrictions: No Pain Pain Assessment Pain Scale: 0-10 Pain Score: 5  Pain Type: Acute pain Pain Location: Buttocks Pain Descriptors / Indicators: Burning Pain Onset: On-going Pain Intervention(s): Repositioned;Other (Comment);RN made aware(tilt for pressure rlief) Perception  Perception Perception: Impaired Inattention/Neglect: Does not attend to right visual field;Does not attend to right side of body Praxis Praxis: Impaired Praxis Impairment Details: Motor planning  Cognition Overall  Cognitive Status: Impaired/Different  from baseline Arousal/Alertness: Awake/alert Orientation Level: Oriented X4 Attention: Focused;Sustained Focused Attention: Appears intact Sustained Attention: Impaired Safety/Judgment: Appears intact Sensation Sensation Light Touch: Appears Intact Hot/Cold: Not tested Proprioception: Impaired by gross assessment Coordination Gross Motor Movements are Fluid and Coordinated: No Coordination and Movement Description: pt still with significant R hemiparsis/hemiplegia, impaired midline orientation Heel Shin Test: unable due to R hemiparesis/hemiplegia Motor  Motor Motor: Hemiplegia;Abnormal tone;Abnormal postural alignment and control Motor - Discharge Observations: still with significant R hemiparesis/hemiplegia; R inattention with poor midline alignment although improved in sitting; hypotonic RUE/RLE  Mobility Bed Mobility Bed Mobility: Rolling Left;Supine to Sit;Rolling Right;Sit to Supine Rolling Right: Moderate Assistance - Patient 50-74% Rolling Left: Minimal Assistance - Patient > 75% Supine to Sit: Moderate Assistance - Patient 50-74% Sit to Supine: Moderate Assistance - Patient 50-74% Transfers Transfers: Sit to Stand;Stand to Sit;Lateral/Scoot Transfers Sit to Stand: Maximal Assistance - Patient 25-49% Stand to Sit: Maximal Assistance - Patient 25-49% Lateral/Scoot Transfers: Maximal Assistance - Patient 25-49% Transfer (Assistive device): Other (Comment)(beasy board for lateral scoots and hallway rail for sit<>stands) Locomotion  Gait Ambulation: No Gait Gait: No Stairs / Additional Locomotion Stairs: No Wheelchair Mobility Wheelchair Mobility: Yes(dependent in TIS w/c)  Trunk/Postural Assessment  Cervical Assessment Cervical Assessment: Exceptions to WFL(forward head with improved ability to perform extension for upright head control) Thoracic Assessment Thoracic Assessment: Exceptions to WFL(significant thoracic kyphosis,  rounded shoulers) Postural Control Postural Control: Deficits on evaluation Trunk Control: varying supervision/CGA for static sitting balance; up to mod assist for dynamic sitting balance using UE support as needed Righting Reactions: still delayed and inadequate Protective Responses: still delayed and inadequate  Balance Balance Balance Assessed: Yes Static Sitting Balance Static Sitting - Level of Assistance: 5: Stand by assistance;4: Min assist Dynamic Sitting Balance Dynamic Sitting - Level of Assistance: 4: Min assist;3: Mod assist Static Standing Balance Static Standing - Balance Support: Left upper extremity supported Static Standing - Level of Assistance: 2: Max assist Extremity Assessment      RLE Assessment RLE Assessment: Exceptions to [facility] Passive Range of Motion (PROM) Comments: WNL General Strength Comments: 0/5 throughout with pt unable to volitionally move R LE on command; however, trace hip extension noted during bridging and sit<>stands RLE Tone RLE Tone: Flaccid LLE Assessment LLE Assessment: Exceptions to [facility] LLE Strength Left Hip Flexion: 4-/5 Left Knee Flexion: 3-/5 Left Knee Extension: 4/5 Left Ankle Dorsiflexion: 3-/5 Left Ankle Plantar Flexion: 3+/5    Tawana Scale, PT, DPT 08/23/2019, 7:51 AM

## 2019-08-23 NOTE — Progress Notes (Signed)
Speech Language Pathology Discharge Summary  Patient Details  Name: [name] MRN: [id number] Date of Birth: 07/25/1941  Today's Date: 08/23/2019 SLP Individual Time: 1030-1100 SLP Individual Time Calculation (min): 30 min   Skilled Therapeutic Interventions:  Skilled treatment session targeted completion of cognitive therapy in this venue of care. Upon arriving to pt's room she was in very deep sleep that required extensive cues and time for her to arouse. After arousing pt provided that she was overly sleepy today and she recalls visit from Palliative Care as well as excited about going home tomorrow. Given level of fatigue, tasks limited to verbal interactions. With question cues, pt able to list activities that would require help from caregiver (such as medication management). Pt is able to provide details on discharge plan.     Patient has met 3 of 3 long term goals.  Patient to discharge at overall Mod level.  Reasons goals not met:   N/A  Clinical Impression/Discharge Summary:   Pt has made slow progress towards LTGs and as a result she was met all of them at moderate level of assistance. Pt continues to require assistance with medication management, tasks initiation, task endurance, right inattention and emergent awareness. Education has been completed with pt's husband on pt's need for 24 hour supervision and assistance with tasks specifically recall of medicines and administration of medication. Recommend HHST to target improving endurance during cognitive tasks, semi-complex problem solving and emergent awareness. Also recommend OP Palliative care follow up.    Care Partner:  Caregiver Able to Provide Assistance: Yes  Type of Caregiver Assistance: Physical;Cognitive  Recommendation:  Home Health SLP;24 hour supervision/assistance  Rationale for SLP Follow Up: Maximize cognitive function and independence;Reduce caregiver burden   Equipment:   N/A for ST  Reasons for  discharge: Discharged from hospital   Patient/Family Agrees with Progress Made and Goals Achieved: Yes      08/23/2019, 11:35 AM    

## 2019-08-23 NOTE — Progress Notes (Signed)
Occupational Therapy Session Note  Patient Details  Name: [name] MRN: [id number] Date of Birth: [birth year]  Today's Date: 08/23/2019 OT Individual Time: 0930-1000 OT Individual Time Calculation (min): 30 min    Short Term Goals: Week 1:  OT Short Term Goal 1 (Week 1): Pt will maintain sitting balance with min assist to prepare for ADL tasks OT Short Term Goal 1 - Progress (Week 1): Not met OT Short Term Goal 2 (Week 1): Pt will complete UB bathing with min assist OT Short Term Goal 2 - Progress (Week 1): Progressing toward goal OT Short Term Goal 3 (Week 1): Pt will complete LB bathing with max assist of 1 caregiver OT Short Term Goal 3 - Progress (Week 1): Not met OT Short Term Goal 4 (Week 1): Pt will complete toilet transfer with max assist of one caregiver OT Short Term Goal 4 - Progress (Week 1): Not met Week 2:  OT Short Term Goal 1 (Week 2): Pt will be able to sit EOB with mod A for balance to prepare for her ability to sit unsupported on a toilet. OT Short Term Goal 1 - Progress (Week 2): Met OT Short Term Goal 2 (Week 2): Pt will be able to use a slide board with mod A of 1 bed to w/c to prepare for her ability to do toilet transfers. OT Short Term Goal 2 - Progress (Week 2): Progressing toward goal OT Short Term Goal 3 (Week 2): Pt will demonstrate improved awareness of RUE with self ROM with min A and min cues. OT Short Term Goal 3 - Progress (Week 2): Met Week 3:  OT Short Term Goal 1 (Week 3): FAmily education to be completed with hoyer lift OT Short Term Goal 2 (Week 3): Slide board transfer bed to chair with therapy only with mod A +1   Skilled Therapeutic Interventions/Progress Updates:    Pt seen this session for pt and family education with spouse to practice LB self care from bed level.  Pt received in bed and when education started and therapist removed her brief, there was a large amount of liquidy dark stool. Pt not aware that she had a bowel movement. Worked  with spouse on positioning, rolling, cleansing, changing briefs and donning pants with the patient.   Place R resting hand splint on patient. Continues to have developing wrist flexion contracture.  Pt in bed with all needs met.    Therapy Documentation Precautions:  Precautions Precautions: Fall Precaution Comments: right hemiplegia Restrictions Weight Bearing Restrictions: No       Pain: Pain Assessment Pain Scale: 0-10 Pain Score: 5  Pain Type: Acute pain Pain Location: Buttocks Pain Descriptors / Indicators: Burning Pain Onset: On-going Pain Intervention(s): Repositioned;Other (Comment);RN made aware(tilt for pressure rlief) ADL: ADL Eating: Set up Where Assessed-Eating: Chair Upper Body Bathing: Moderate assistance Where Assessed-Upper Body Bathing: Bed level Lower Body Bathing: Maximal assistance Where Assessed-Lower Body Bathing: Bed level Upper Body Dressing: Moderate assistance Where Assessed-Upper Body Dressing: Bed level Lower Body Dressing: Maximal assistance Where Assessed-Lower Body Dressing: Bed level Toilet Transfer: Maximal assistance(+2) Toilet Transfer Method: Stand pivot Toilet Transfer Equipment: Drop arm bedside commode   Therapy/Group: Individual Therapy  [place] 08/23/2019, 12:27 PM

## 2019-08-23 NOTE — Progress Notes (Signed)
Occupational Therapy Discharge Summary  Patient Details  Name: [name] MRN: [id number] Date of Birth: May 27, 1941   Patient has met 6 of 6 long term goals due to improved activity tolerance, improved balance, postural control, ability to compensate for deficits and improved awareness.  Patient to discharge at [facility] Max Assist level.  Patient's care partner is independent to provide the necessary physical assistance at discharge from bed level only.  She will have hired caregivers to A with self care out of bed. Pt is incontinent of bowel and bladder and not able to sit safely on BSC.  Pt is using bed pan or briefs only.   Reasons goals not met: n/a - many of her initial goals were downgraded or discharged due to limited progress  Recommendation:  Patient will benefit from ongoing skilled OT services in home health setting to continue to advance functional skills in the area of BADL and Reduce care partner burden.  Equipment: No equipment provided  Reasons for discharge: treatment goals met  Patient/family agrees with progress made and goals achieved: Yes  OT Discharge ADL ADL Eating: Set up Where Assessed-Eating: Chair Grooming: Setup Where Assessed-Grooming: Bed level Upper Body Bathing: Moderate assistance Where Assessed-Upper Body Bathing: Bed level Lower Body Bathing: Moderate assistance Where Assessed-Lower Body Bathing: Bed level Upper Body Dressing: Moderate assistance Where Assessed-Upper Body Dressing: Bed level Lower Body Dressing: Maximal assistance Where Assessed-Lower Body Dressing: Bed level  Vision Baseline Vision/History: Wears glasses Wears Glasses: Reading only Patient Visual Report: No change from baseline Ocular Range of Motion: Within Functional Limits Tracking/Visual Pursuits: Decreased smoothness of horizontal tracking Visual Fields: No apparent deficits Perception  Perception: Impaired Inattention/Neglect: Does not attend to right visual  field;Does not attend to right side of body Praxis Praxis: Impaired Praxis Impairment Details: Motor planning Cognition Overall Cognitive Status: Impaired/Different from baseline Arousal/Alertness: Awake/alert Orientation Level: Oriented X4 Attention: Focused;Sustained Focused Attention: Appears intact Sustained Attention: Impaired Alternating Attention: Appears intact Memory: Appears intact Awareness: Appears intact Problem Solving: Impaired Problem Solving Impairment: Functional complex;Verbal complex(semi-complex is impaired as well) Executive Function: Reasoning;Sequencing;Organizing Reasoning: Impaired Reasoning Impairment: Verbal complex;Functional complex(semi-complex is impaired as well) Sequencing: Impaired Sequencing Impairment: Verbal complex;Functional complex(semi-complex is impaired as well) Organizing: Impaired Organizing Impairment: Verbal complex;Functional complex(semi-complex is impaired as well) Self Correcting: Impaired Self Correcting Impairment: Verbal complex;Functional complex(semi-complex is impaired as well) Safety/Judgment: Appears intact Sensation Sensation Light Touch: Appears Intact Hot/Cold: Not tested Proprioception: Impaired by gross assessment Coordination Gross Motor Movements are Fluid and Coordinated: No Coordination and Movement Description: pt still with significant R hemiparsis/hemiplegia, impaired midline orientation Heel Shin Test: unable due to R hemiparesis/hemiplegia Motor  Motor Motor: Hemiplegia;Abnormal tone;Abnormal postural alignment and control Motor - Discharge Observations: still with significant R hemiparesis/hemiplegia; R inattention with poor midline alignment although improved in sitting; hypotonic RUE/RLE Mobility  Bed Mobility Bed Mobility: Rolling Left;Supine to Sit;Rolling Right;Sit to Supine Rolling Right: Moderate Assistance - Patient 50-74% Rolling Left: Minimal Assistance - Patient > 75% Supine to Sit:  Moderate Assistance - Patient 50-74% Sit to Supine: Moderate Assistance - Patient 50-74% Transfers Sit to Stand: Maximal Assistance - Patient 25-49% Stand to Sit: Maximal Assistance - Patient 25-49%  Trunk/Postural Assessment  Cervical Assessment Cervical Assessment: Exceptions to WFL(forward head with improved ability to perform extension for upright head control) Thoracic Assessment Thoracic Assessment: Exceptions to WFL(significant thoracic kyphosis, rounded shoulers) Postural Control Postural Control: Deficits on evaluation Trunk Control: varying supervision/CGA for static sitting balance; up to mod assist for dynamic sitting balance  using UE support as needed Righting Reactions: still delayed and inadequate Protective Responses: still delayed and inadequate  Balance Balance Balance Assessed: Yes Static Sitting Balance Static Sitting - Level of Assistance: 5: Stand by assistance;4: Min assist Dynamic Sitting Balance Dynamic Sitting - Level of Assistance: 4: Min assist;3: Mod assist Static Standing Balance Static Standing - Balance Support: Left upper extremity supported Static Standing - Level of Assistance: 2: Max assist Extremity/Trunk Assessment RUE Assessment Passive Range of Motion (PROM) Comments: tolerates WNL without any pain Active Range of Motion (AROM) Comments: no active movement, except for some increased tone in finger flexors with occasional flexor response General Strength Comments: flaccid, except for increased hypertone in wrist flexion with developing wrist tightness LUE Assessment LUE Assessment: Within Functional Limits Active Range of Motion (AROM) Comments: WFL General Strength Comments: grossly 4/5   Sabra Sessler 08/23/2019, 12:43 PM

## 2019-08-23 NOTE — Procedures (Signed)
Seen and examined dialysis this afternoon.  Blood pressure 91/55 and RIJ tunneled catheter.  Procedure supervised.  Note pt on midodrine.  Anemia at goal. Phos acceptable.   Claudia Desanctis, MD 08/23/2019 5:16 PM
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
## 2019-08-24 LAB — GLUCOSE, CAPILLARY: Glucose-Capillary: 239 mg/dL — ABNORMAL HIGH (ref 70–99)

## 2019-08-24 NOTE — Progress Notes (Signed)
Patient is being discharged from rehab. Patient alert, stable,  foley is intact and patent, also Right chest HD catheter intact dressing clean, dry, and intact.  Discharge instructions were given to both patient and husband prior to discharge date. All patients belongings were sent home with patient. Patient was taken home by Corey Harold) transportation to home.

## 2019-08-24 NOTE — Plan of Care (Signed)
  Problem: Consults Goal: RH STROKE PATIENT EDUCATION Description: See Patient Education module for education specifics  Outcome: Progressing Goal: Nutrition Consult-if indicated Outcome: Progressing Goal: Diabetes Guidelines if Diabetic/Glucose > 140 Description: If diabetic or lab glucose is > 140 mg/dl - Initiate Diabetes/Hyperglycemia Guidelines & Document Interventions  Outcome: Progressing   Problem: RH BOWEL ELIMINATION Goal: RH STG MANAGE BOWEL WITH ASSISTANCE Description: STG Manage Bowel with mod Assistance. Outcome: Progressing Goal: RH STG MANAGE BOWEL W/MEDICATION W/ASSISTANCE Description: STG Manage Bowel with Medication with mod I/supervision Assistance. Outcome: Progressing   Problem: RH BLADDER ELIMINATION Goal: RH STG MANAGE BLADDER WITH ASSISTANCE Description: STG Manage Bladder With mod Assistance Outcome: Progressing   Problem: RH SKIN INTEGRITY Goal: RH STG SKIN FREE OF INFECTION/BREAKDOWN Description: Pt has stage 2 to buttocks. Will be staged 1 or improved prior to DC with mod assist Outcome: Progressing Goal: RH STG MAINTAIN SKIN INTEGRITY WITH ASSISTANCE Description: STG Maintain Skin Integrity With mod Assistance. Outcome: Progressing Goal: RH STG ABLE TO PERFORM INCISION/WOUND CARE W/ASSISTANCE Description: STG Able To Perform Incision/Wound Care With mod Assistance. Outcome: Progressing   Problem: RH SAFETY Goal: RH STG ADHERE TO SAFETY PRECAUTIONS W/ASSISTANCE/DEVICE Description: STG Adhere to Safety Precautions With mod Assistance/Device. Outcome: Progressing Goal: RH STG DECREASED RISK OF FALL WITH ASSISTANCE Description: STG Decreased Risk of Fall With reminders/cues Assistance. Outcome: Progressing   Problem: RH COGNITION-NURSING Goal: RH STG USES MEMORY AIDS/STRATEGIES W/ASSIST TO PROBLEM SOLVE Description: STG Uses Memory Aids/Strategies With min Assistance to Problem Solve. Outcome: Progressing Goal: RH STG ANTICIPATES NEEDS/CALLS FOR  ASSIST W/ASSIST/CUES Description: STG Anticipates Needs/Calls for Assist With min Assistance/Cues. Outcome: Progressing   Problem: RH PAIN MANAGEMENT Goal: RH STG PAIN MANAGED AT OR BELOW PT'S PAIN GOAL Description: Less than 3 on 0-10 scale Outcome: Progressing   Problem: RH KNOWLEDGE DEFICIT Goal: RH STG INCREASE KNOWLEDGE OF DIABETES Description: Pt and husband will be able to verbalize 2 ways to manage DM with medication and diet regimen with min assist Outcome: Progressing Goal: RH STG INCREASE KNOWLEDGE OF HYPERTENSION Description: Pt and husband will be able to verbalize 2 ways to manage HTN with medication and diet regimen with min assist Outcome: Progressing Goal: RH STG INCREASE KNOWLEDGE OF STROKE PROPHYLAXIS Description: Pt and husband will be able to verbalize 2 ways to manage stroke prophylaxis with medication and diet regimen with min assist Outcome: Progressing

## 2019-08-24 NOTE — Plan of Care (Signed)
Problem: Consults Goal: RH STROKE PATIENT EDUCATION Description: See Patient Education module for education specifics  08/24/2019 1001 by Gae Dry D, LPN Outcome: Completed/Met 08/24/2019 1001 by Gae Dry D, LPN Outcome: Progressing Goal: Nutrition Consult-if indicated 08/24/2019 1001 by Gae Dry D, LPN Outcome: Completed/Met 08/24/2019 1001 by Gae Dry D, LPN Outcome: Progressing Goal: Diabetes Guidelines if Diabetic/Glucose > 140 Description: If diabetic or lab glucose is > 140 mg/dl - Initiate Diabetes/Hyperglycemia Guidelines & Document Interventions  08/24/2019 1001 by Gae Dry D, LPN Outcome: Completed/Met 08/24/2019 1001 by Gae Dry D, LPN Outcome: Progressing   Problem: RH BOWEL ELIMINATION Goal: RH STG MANAGE BOWEL WITH ASSISTANCE Description: STG Manage Bowel with mod Assistance. 08/24/2019 1001 by Gae Dry D, LPN Outcome: Completed/Met 08/24/2019 1001 by Gae Dry D, LPN Outcome: Progressing Goal: RH STG MANAGE BOWEL W/MEDICATION W/ASSISTANCE Description: STG Manage Bowel with Medication with mod I/supervision Assistance. 08/24/2019 1001 by Gae Dry D, LPN Outcome: Completed/Met 08/24/2019 1001 by Gae Dry D, LPN Outcome: Progressing   Problem: RH BLADDER ELIMINATION Goal: RH STG MANAGE BLADDER WITH ASSISTANCE Description: STG Manage Bladder With mod Assistance 08/24/2019 1001 by Gae Dry D, LPN Outcome: Completed/Met 08/24/2019 1001 by Gae Dry D, LPN Outcome: Progressing   Problem: RH SKIN INTEGRITY Goal: RH STG SKIN FREE OF INFECTION/BREAKDOWN Description: Pt has stage 2 to buttocks. Will be staged 1 or improved prior to DC with mod assist 08/24/2019 1001 by Gae Dry D, LPN Outcome: Completed/Met 08/24/2019 1001 by Gae Dry D, LPN Outcome: Progressing Goal: RH STG MAINTAIN SKIN INTEGRITY WITH ASSISTANCE Description: STG Maintain Skin Integrity With mod Assistance. 08/24/2019 1001 by Gae Dry D, LPN Outcome:  Completed/Met 08/24/2019 1001 by Gae Dry D, LPN Outcome: Progressing Goal: RH STG ABLE TO PERFORM INCISION/WOUND CARE W/ASSISTANCE Description: STG Able To Perform Incision/Wound Care With mod Assistance. 08/24/2019 1001 by Gae Dry D, LPN Outcome: Completed/Met 08/24/2019 1001 by Gae Dry D, LPN Outcome: Progressing   Problem: RH SAFETY Goal: RH STG ADHERE TO SAFETY PRECAUTIONS W/ASSISTANCE/DEVICE Description: STG Adhere to Safety Precautions With mod Assistance/Device. 08/24/2019 1001 by Gae Dry D, LPN Outcome: Completed/Met 08/24/2019 1001 by Gae Dry D, LPN Outcome: Progressing Goal: RH STG DECREASED RISK OF FALL WITH ASSISTANCE Description: STG Decreased Risk of Fall With reminders/cues Assistance. 08/24/2019 1001 by Gae Dry D, LPN Outcome: Completed/Met 08/24/2019 1001 by Gae Dry D, LPN Outcome: Progressing   Problem: RH COGNITION-NURSING Goal: RH STG USES MEMORY AIDS/STRATEGIES W/ASSIST TO PROBLEM SOLVE Description: STG Uses Memory Aids/Strategies With min Assistance to Problem Solve. 08/24/2019 1001 by Gae Dry D, LPN Outcome: Completed/Met 08/24/2019 1001 by Gae Dry D, LPN Outcome: Progressing Goal: RH STG ANTICIPATES NEEDS/CALLS FOR ASSIST W/ASSIST/CUES Description: STG Anticipates Needs/Calls for Assist With min Assistance/Cues. 08/24/2019 1001 by Gae Dry D, LPN Outcome: Completed/Met 08/24/2019 1001 by Gae Dry D, LPN Outcome: Progressing   Problem: RH PAIN MANAGEMENT Goal: RH STG PAIN MANAGED AT OR BELOW PT'S PAIN GOAL Description: Less than 3 on 0-10 scale 08/24/2019 1001 by Gae Dry D, LPN Outcome: Completed/Met 08/24/2019 1001 by Gae Dry D, LPN Outcome: Progressing   Problem: RH KNOWLEDGE DEFICIT Goal: RH STG INCREASE KNOWLEDGE OF DIABETES Description: Pt and husband will be able to verbalize 2 ways to manage DM with medication and diet regimen with min assist 08/24/2019 1001 by Gae Dry D, LPN Outcome:  Completed/Met 08/24/2019 1001 by Gae Dry D, LPN Outcome: Progressing Goal: RH STG INCREASE KNOWLEDGE OF HYPERTENSION Description: Pt and husband will be able to verbalize 2  ways to manage HTN with medication and diet regimen with min assist 08/24/2019 1001 by Gae Dry D, LPN Outcome: Completed/Met 08/24/2019 1001 by Gae Dry D, LPN Outcome: Progressing Goal: RH STG INCREASE KNOWLEDGE OF STROKE PROPHYLAXIS Description: Pt and husband will be able to verbalize 2 ways to manage stroke prophylaxis with medication and diet regimen with min assist 08/24/2019 1001 by Gae Dry D, LPN Outcome: Completed/Met 08/24/2019 1001 by Gae Dry D, LPN Outcome: Progressing

## 2019-08-24 NOTE — Progress Notes (Signed)
Social Work Discharge Note   The overall goal for the admission was met for:   Discharge location: Yes - home with spouse and private duty caregivers providing 24/7 assistance.  Length of Stay: Yes - 28 days  Discharge activity level: Yes - max assist overall  Home/community participation: Yes  Services provided included: MD, RD, PT, OT, SLP, RN, TR, Pharmacy and SW  Financial Services: Medicare  Follow-up services arranged: Home Health: RN, PT, OT, ST via [facility], DME: tilt-in-space wheelchair, cushion, hospital bed, hoyer lift and drop arm commode via [place] and Patient/Family has no preference for HH/DME agencies  Comments (or additional information):  Spouse, [name] @ [phone]  Patient/Family verbalized understanding of follow-up arrangements: Yes  Individual responsible for coordination of the follow-up plan: spouse  Confirmed correct DME delivered: Yailene Badia 08/24/2019    Siarah Deleo

## 2019-08-24 NOTE — Progress Notes (Signed)
[facility] PHYSICAL MEDICINE & REHABILITATION PROGRESS NOTE   Subjective/Complaints:   Equipment delivered to home    ROS: Patient deniesCP, SOB, N/V/D   Objective:   No results found. Recent Labs    08/23/19 0709 08/23/19 1419  WBC  --  6.1  HGB 11.1* 10.3*  HCT 35.1* 32.7*  PLT  --  120*   Recent Labs    08/22/19 0837 08/23/19 1418  NA 131* 130*  K 4.7 4.8  CL 94* 97*  CO2 26 21*  GLUCOSE 197* 287*  BUN 61* 97*  CREATININE 3.55* 5.27*  CALCIUM 9.2 8.7*    Intake/Output Summary (Last 24 hours) at 08/24/2019 0738 Last data filed at 08/23/2019 1657 Gross per 24 hour  Intake 480 ml  Output 1850 ml  Net -1370 ml     Physical Exam: Vital Signs Blood pressure (!) 99/57, pulse 78, temperature 98.3 F (36.8 C), resp. rate 19, height 5\' 8"  (1.727 m), weight 50.7 kg, SpO2 96 %. Constitutional: No distress . Vital signs reviewed. Husband at bedside; pt in bed HEENT: EOMI, oral membranes moist Neck: supple Cardiovascular: RRR with murmur. No JVD    Respiratory: CTA Bilaterally without wheezes or rales. Normal effort    GI: BS +, non-tender, non-distended  Rectal- not visualized (in w/c) Skin: Warm and dry.  Intact. Psych: Normal mood.  Normal behavior. Musc: No edema in extremities.  No tenderness in extremities. Neurological: Alert Motor: RUE/RLE: 0/5 proximal distal,trace finger flexion  LUE: 4/5 proximal to distal LLE: 4-/5 proximal to distal  Assessment/Plan: 1. 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  Stable for D/C today F/u PCP in 3-4 weeks F/u PM&R 2 weeks See D/C summary See D/C instructions Care Tool:  Bathing    Body parts bathed by patient: Chest, Abdomen, Front perineal area, Right upper leg, Left upper leg, Face, Left lower leg   Body parts bathed by helper: Right lower leg, Buttocks, Left arm, Right arm Body parts n/a: Front perineal area, Buttocks, Right upper leg, Left lower leg, Right lower leg, Left upper leg(did not  attempt)   Bathing assist Assist Level: Moderate Assistance - Patient 50 - 74%     Upper Body Dressing/Undressing Upper body dressing   What is the patient wearing?: Pull over shirt    Upper body assist Assist Level: Moderate Assistance - Patient 50 - 74%    Lower Body Dressing/Undressing Lower body dressing      What is the patient wearing?: Incontinence brief, Pants     Lower body assist Assist for lower body dressing: Maximal Assistance - Patient 25 - 49%     Toileting Toileting    Toileting assist Assist for toileting: Dependent - Patient 0%     Transfers Chair/bed transfer  Transfers assist     Chair/bed transfer assist level: Maximal Assistance - Patient 25 - 49%     Locomotion Ambulation   Ambulation assist   Ambulation activity did not occur: Safety/medical concerns          Walk 10 feet activity   Assist  Walk 10 feet activity did not occur: Safety/medical concerns        Walk 50 feet activity   Assist Walk 50 feet with 2 turns activity did not occur: Safety/medical concerns         Walk 150 feet activity   Assist Walk 150 feet activity did not occur: Safety/medical concerns         Walk 10 feet on uneven surface  activity   Assist Walk 10 feet on uneven surfaces activity did not occur: Safety/medical concerns         Wheelchair     Assist Will patient use wheelchair at discharge?: Yes Type of Wheelchair: Manual(pt not safe to drive power w/c in home environment)    Wheelchair assist level: Dependent - Patient 0% Max wheelchair distance: 136ft    Wheelchair 50 feet with 2 turns activity    Assist        Assist Level: Dependent - Patient 0%   Wheelchair 150 feet activity     Assist      Assist Level: Dependent - Patient 0%   Blood pressure (!) 99/57, pulse 78, temperature 98.3 F (36.8 C), resp. rate 19, height 5\' 8"  (1.727 m), weight 50.7 kg, SpO2 96 %.  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 Antithrombotics: -DVT/anticoagulation:SCDs -antiplatelet therapy: Aspirin 81 mg daily and Plavix 75 mg daily x3 weeks then aspirin alone 3. Pain Management:Tylenol as needed 4. Mood:Xanax 1 mg nightly held for lethargy which has improved  - resumed at bedtime prn at lower dose  -antipsychotic agents: N/A 5. Neuropsych: This patientiscapable of making decisions on herown behalf. 6. Skin/Wound Care:Routine skin checks -local skin care for abrasions -nutrition discussed  -benadryl prn for itching  -sarna lotion prn 7. Fluids/Electrolytes/Nutrition:Routine and outs 8.  ESRD. Hemodialysis per renal services. HD after therapy day to allow for better therapy participation 9. Orthostasis. Continue ProAmatine 10 mg Monday Wednesday Friday   Vitals:   08/24/19 0620 08/24/19 0621  BP: (!) 100/57 (!) 99/57  Pulse: 79 78  Resp: 19   Temp: 98.3 F (36.8 C)   SpO2: 98% 96%  controlled 12/28 10. Hyperlipidemia. Crestor 11. Acute on chronic combined congestive heart failure. Monitor for any signs of fluid overload. Daily weights  TEE suggesting EF of 35% with mild MR, TR, AS Filed Weights   08/23/19 1350 08/23/19 1657 08/24/19 0621  Weight: 54.7 kg 51.8 kg 50.7 kg    Fluid balance as per Nephro 12. Diabetes mellitus type II with hyperglycemia, hemoglobin A1c 5.3.   CBG (last 3)  Recent Labs    08/23/19 1207 08/23/19 1741 08/23/19 2141  GLUCAP 132* 105* 302*  mainly controlled , occ elevation   13. History of tobacco abuse. Counseling 14. CAD with CABG 2010. Continue aspirin. No chest pain reported. 15. IBS/history of GI bleed. hold Amitiza24 mcg daily - due to diarrhea  16.  Social son threatened to break through security to visit his  mom  22.  Chronic cystitis  Seen by urology, on no abx at present, chronic foley , f/u uro  18.  Diarrhea- C diff neg,was on  Amitiza took this every other day at home to avoid diarrhea   -being held for now  Prn antidiarrheal 19.  Vaginal d/c: appreciate GYN eval- endometrial mass will need f/u   20.  ABLA GI bleed hx of same (diverticular per GI), Hgb responded to 2 U PRBC , appreciate GI consult --outpt f/u with Dr. Collene Mares   Hgb stable at 7.2 no orthostasic symptoms  1/1: Received 2U PRBC Hgb 10.3 on 1/6, improved to 11.1 on 1/8       LOS: 28 days A FACE TO Daniel E Neyland Pettengill 08/24/2019, 7:38 AM

## 2019-08-25 DIAGNOSIS — I2510 Atherosclerotic heart disease of native coronary artery without angina pectoris: Secondary | ICD-10-CM | POA: Diagnosis not present

## 2019-08-25 DIAGNOSIS — E1122 Type 2 diabetes mellitus with diabetic chronic kidney disease: Secondary | ICD-10-CM | POA: Diagnosis not present

## 2019-08-25 DIAGNOSIS — L89322 Pressure ulcer of left buttock, stage 2: Secondary | ICD-10-CM | POA: Diagnosis not present

## 2019-08-25 DIAGNOSIS — I69351 Hemiplegia and hemiparesis following cerebral infarction affecting right dominant side: Secondary | ICD-10-CM | POA: Diagnosis not present

## 2019-08-25 DIAGNOSIS — E785 Hyperlipidemia, unspecified: Secondary | ICD-10-CM | POA: Diagnosis not present

## 2019-08-25 DIAGNOSIS — S41111D Laceration without foreign body of right upper arm, subsequent encounter: Secondary | ICD-10-CM | POA: Diagnosis not present

## 2019-08-25 DIAGNOSIS — I5042 Chronic combined systolic (congestive) and diastolic (congestive) heart failure: Secondary | ICD-10-CM | POA: Diagnosis not present

## 2019-08-25 DIAGNOSIS — N186 End stage renal disease: Secondary | ICD-10-CM | POA: Diagnosis not present

## 2019-08-25 DIAGNOSIS — K589 Irritable bowel syndrome without diarrhea: Secondary | ICD-10-CM | POA: Diagnosis not present

## 2019-08-25 DIAGNOSIS — I951 Orthostatic hypotension: Secondary | ICD-10-CM | POA: Diagnosis not present

## 2019-08-26 ENCOUNTER — Telehealth: Payer: Self-pay | Admitting: Internal Medicine

## 2019-08-26 DIAGNOSIS — Z992 Dependence on renal dialysis: Secondary | ICD-10-CM | POA: Diagnosis not present

## 2019-08-26 DIAGNOSIS — N186 End stage renal disease: Secondary | ICD-10-CM | POA: Diagnosis not present

## 2019-08-26 DIAGNOSIS — N2581 Secondary hyperparathyroidism of renal origin: Secondary | ICD-10-CM | POA: Diagnosis not present

## 2019-08-26 DIAGNOSIS — D689 Coagulation defect, unspecified: Secondary | ICD-10-CM | POA: Diagnosis not present

## 2019-08-26 DIAGNOSIS — D631 Anemia in chronic kidney disease: Secondary | ICD-10-CM | POA: Diagnosis not present

## 2019-08-26 NOTE — Telephone Encounter (Signed)
Telephone call to patients husband to schedule palliative care visit. He asked that we call back later as patient could not come to the phone at this time.

## 2019-08-27 ENCOUNTER — Telehealth: Payer: Self-pay | Admitting: Registered Nurse

## 2019-08-27 ENCOUNTER — Telehealth: Payer: Self-pay | Admitting: Internal Medicine

## 2019-08-27 DIAGNOSIS — I2510 Atherosclerotic heart disease of native coronary artery without angina pectoris: Secondary | ICD-10-CM | POA: Diagnosis not present

## 2019-08-27 DIAGNOSIS — E1122 Type 2 diabetes mellitus with diabetic chronic kidney disease: Secondary | ICD-10-CM | POA: Diagnosis not present

## 2019-08-27 DIAGNOSIS — S41111D Laceration without foreign body of right upper arm, subsequent encounter: Secondary | ICD-10-CM | POA: Diagnosis not present

## 2019-08-27 DIAGNOSIS — I951 Orthostatic hypotension: Secondary | ICD-10-CM | POA: Diagnosis not present

## 2019-08-27 DIAGNOSIS — I5042 Chronic combined systolic (congestive) and diastolic (congestive) heart failure: Secondary | ICD-10-CM | POA: Diagnosis not present

## 2019-08-27 DIAGNOSIS — K589 Irritable bowel syndrome without diarrhea: Secondary | ICD-10-CM | POA: Diagnosis not present

## 2019-08-27 DIAGNOSIS — N186 End stage renal disease: Secondary | ICD-10-CM | POA: Diagnosis not present

## 2019-08-27 DIAGNOSIS — I69351 Hemiplegia and hemiparesis following cerebral infarction affecting right dominant side: Secondary | ICD-10-CM | POA: Diagnosis not present

## 2019-08-27 DIAGNOSIS — E785 Hyperlipidemia, unspecified: Secondary | ICD-10-CM | POA: Diagnosis not present

## 2019-08-27 DIAGNOSIS — L89322 Pressure ulcer of left buttock, stage 2: Secondary | ICD-10-CM | POA: Diagnosis not present

## 2019-08-27 NOTE — Telephone Encounter (Signed)
Transitional Care call Spoke with Ms. Deery and Her Care Giver Rona'e  Patient name: [name]  DOB: 1941/06/28 1. Are you/is patient experiencing any problems since coming home? No a. Are there any questions regarding any aspect of care? No 2. Are there any questions regarding medications administration/dosing? No a. Are meds being taken as prescribed? Yes b. "Patient should review meds with caller to confirm" Medication List Reviewed. 3. Have there been any falls? No 4. Has Home Health been to the house and/or have they contacted you? Yes, [facility] a. If not, have you tried to contact them? NA b. Can we help you contact them? NA 5. Are bowels and bladder emptying properly? She has a foley in placed and her bowels are moving properly.  a. Are there any unexpected incontinence issues? No b. If applicable, is patient following bowel/bladder programs? NA 6. Any fevers, problems with breathing, unexpected pain? No 7. Are there any skin problems or new areas of breakdown? No 8. Has the patient/family member arranged specialty MD follow up (ie cardiology/neurology/renal/surgical/etc.)?  Spoke with Rona'e, she will speak with Mr. Mecca to schedule HFU appointments.  a. Can we help arrange? No 9. Does the patient need any other services or support that we can help arrange? No 10. Are caregivers following through as expected in assisting the patient? Yes 11. Has the patient quit smoking, drinking alcohol, or using drugs as recommended? (                        )  Appointment date/time 09/06/2019  arrival time 12:15 for 12:30 appointment with Dr Letta Pate. [facility] [place]

## 2019-08-27 NOTE — Telephone Encounter (Signed)
Placed a call to [name], line busy. Will try agaim.

## 2019-08-27 NOTE — Telephone Encounter (Signed)
Phone call placed to patient to offer to schedule [facility] Palliative visit, Phone rang with no answer and no voicemail.

## 2019-08-29 DIAGNOSIS — I69351 Hemiplegia and hemiparesis following cerebral infarction affecting right dominant side: Secondary | ICD-10-CM | POA: Diagnosis not present

## 2019-08-29 DIAGNOSIS — I5042 Chronic combined systolic (congestive) and diastolic (congestive) heart failure: Secondary | ICD-10-CM | POA: Diagnosis not present

## 2019-08-29 DIAGNOSIS — L89322 Pressure ulcer of left buttock, stage 2: Secondary | ICD-10-CM | POA: Diagnosis not present

## 2019-08-29 DIAGNOSIS — I951 Orthostatic hypotension: Secondary | ICD-10-CM | POA: Diagnosis not present

## 2019-08-29 DIAGNOSIS — S41111D Laceration without foreign body of right upper arm, subsequent encounter: Secondary | ICD-10-CM | POA: Diagnosis not present

## 2019-08-29 DIAGNOSIS — E1122 Type 2 diabetes mellitus with diabetic chronic kidney disease: Secondary | ICD-10-CM | POA: Diagnosis not present

## 2019-08-29 DIAGNOSIS — N186 End stage renal disease: Secondary | ICD-10-CM | POA: Diagnosis not present

## 2019-08-29 DIAGNOSIS — I2510 Atherosclerotic heart disease of native coronary artery without angina pectoris: Secondary | ICD-10-CM | POA: Diagnosis not present

## 2019-08-29 DIAGNOSIS — E785 Hyperlipidemia, unspecified: Secondary | ICD-10-CM | POA: Diagnosis not present

## 2019-08-29 DIAGNOSIS — K589 Irritable bowel syndrome without diarrhea: Secondary | ICD-10-CM | POA: Diagnosis not present

## 2019-08-30 ENCOUNTER — Telehealth: Payer: Self-pay | Admitting: Internal Medicine

## 2019-08-30 NOTE — Telephone Encounter (Signed)
[facility] Palliative appointment scheduled for 09-10-19 at 10:00am.

## 2019-09-03 DIAGNOSIS — K589 Irritable bowel syndrome without diarrhea: Secondary | ICD-10-CM | POA: Diagnosis not present

## 2019-09-03 DIAGNOSIS — E1122 Type 2 diabetes mellitus with diabetic chronic kidney disease: Secondary | ICD-10-CM | POA: Diagnosis not present

## 2019-09-03 DIAGNOSIS — I5042 Chronic combined systolic (congestive) and diastolic (congestive) heart failure: Secondary | ICD-10-CM | POA: Diagnosis not present

## 2019-09-03 DIAGNOSIS — N186 End stage renal disease: Secondary | ICD-10-CM | POA: Diagnosis not present

## 2019-09-03 DIAGNOSIS — I951 Orthostatic hypotension: Secondary | ICD-10-CM | POA: Diagnosis not present

## 2019-09-03 DIAGNOSIS — S41111D Laceration without foreign body of right upper arm, subsequent encounter: Secondary | ICD-10-CM | POA: Diagnosis not present

## 2019-09-03 DIAGNOSIS — I69351 Hemiplegia and hemiparesis following cerebral infarction affecting right dominant side: Secondary | ICD-10-CM | POA: Diagnosis not present

## 2019-09-03 DIAGNOSIS — L89322 Pressure ulcer of left buttock, stage 2: Secondary | ICD-10-CM | POA: Diagnosis not present

## 2019-09-03 DIAGNOSIS — E785 Hyperlipidemia, unspecified: Secondary | ICD-10-CM | POA: Diagnosis not present

## 2019-09-03 DIAGNOSIS — I2510 Atherosclerotic heart disease of native coronary artery without angina pectoris: Secondary | ICD-10-CM | POA: Diagnosis not present

## 2019-09-04 DIAGNOSIS — L89322 Pressure ulcer of left buttock, stage 2: Secondary | ICD-10-CM | POA: Diagnosis not present

## 2019-09-04 DIAGNOSIS — E1122 Type 2 diabetes mellitus with diabetic chronic kidney disease: Secondary | ICD-10-CM | POA: Diagnosis not present

## 2019-09-04 DIAGNOSIS — K589 Irritable bowel syndrome without diarrhea: Secondary | ICD-10-CM | POA: Diagnosis not present

## 2019-09-04 DIAGNOSIS — I69351 Hemiplegia and hemiparesis following cerebral infarction affecting right dominant side: Secondary | ICD-10-CM | POA: Diagnosis not present

## 2019-09-04 DIAGNOSIS — I2510 Atherosclerotic heart disease of native coronary artery without angina pectoris: Secondary | ICD-10-CM | POA: Diagnosis not present

## 2019-09-04 DIAGNOSIS — I5042 Chronic combined systolic (congestive) and diastolic (congestive) heart failure: Secondary | ICD-10-CM | POA: Diagnosis not present

## 2019-09-04 DIAGNOSIS — I951 Orthostatic hypotension: Secondary | ICD-10-CM | POA: Diagnosis not present

## 2019-09-04 DIAGNOSIS — S41111D Laceration without foreign body of right upper arm, subsequent encounter: Secondary | ICD-10-CM | POA: Diagnosis not present

## 2019-09-04 DIAGNOSIS — E785 Hyperlipidemia, unspecified: Secondary | ICD-10-CM | POA: Diagnosis not present

## 2019-09-04 DIAGNOSIS — N186 End stage renal disease: Secondary | ICD-10-CM | POA: Diagnosis not present

## 2019-09-05 DIAGNOSIS — L89322 Pressure ulcer of left buttock, stage 2: Secondary | ICD-10-CM | POA: Diagnosis not present

## 2019-09-05 DIAGNOSIS — S41111D Laceration without foreign body of right upper arm, subsequent encounter: Secondary | ICD-10-CM | POA: Diagnosis not present

## 2019-09-05 DIAGNOSIS — I69351 Hemiplegia and hemiparesis following cerebral infarction affecting right dominant side: Secondary | ICD-10-CM | POA: Diagnosis not present

## 2019-09-05 DIAGNOSIS — E1122 Type 2 diabetes mellitus with diabetic chronic kidney disease: Secondary | ICD-10-CM | POA: Diagnosis not present

## 2019-09-05 DIAGNOSIS — I5042 Chronic combined systolic (congestive) and diastolic (congestive) heart failure: Secondary | ICD-10-CM | POA: Diagnosis not present

## 2019-09-05 DIAGNOSIS — I2510 Atherosclerotic heart disease of native coronary artery without angina pectoris: Secondary | ICD-10-CM | POA: Diagnosis not present

## 2019-09-05 DIAGNOSIS — I951 Orthostatic hypotension: Secondary | ICD-10-CM | POA: Diagnosis not present

## 2019-09-05 DIAGNOSIS — E785 Hyperlipidemia, unspecified: Secondary | ICD-10-CM | POA: Diagnosis not present

## 2019-09-05 DIAGNOSIS — K589 Irritable bowel syndrome without diarrhea: Secondary | ICD-10-CM | POA: Diagnosis not present

## 2019-09-05 DIAGNOSIS — N186 End stage renal disease: Secondary | ICD-10-CM | POA: Diagnosis not present

## 2019-09-06 ENCOUNTER — Encounter: Payer: Medicare HMO | Attending: Physical Medicine & Rehabilitation | Admitting: Physical Medicine & Rehabilitation

## 2019-09-06 DIAGNOSIS — L89322 Pressure ulcer of left buttock, stage 2: Secondary | ICD-10-CM | POA: Diagnosis not present

## 2019-09-06 DIAGNOSIS — E785 Hyperlipidemia, unspecified: Secondary | ICD-10-CM | POA: Diagnosis not present

## 2019-09-06 DIAGNOSIS — I2510 Atherosclerotic heart disease of native coronary artery without angina pectoris: Secondary | ICD-10-CM | POA: Diagnosis not present

## 2019-09-06 DIAGNOSIS — E1122 Type 2 diabetes mellitus with diabetic chronic kidney disease: Secondary | ICD-10-CM | POA: Diagnosis not present

## 2019-09-06 DIAGNOSIS — I69351 Hemiplegia and hemiparesis following cerebral infarction affecting right dominant side: Secondary | ICD-10-CM | POA: Diagnosis not present

## 2019-09-06 DIAGNOSIS — I951 Orthostatic hypotension: Secondary | ICD-10-CM | POA: Diagnosis not present

## 2019-09-06 DIAGNOSIS — I5042 Chronic combined systolic (congestive) and diastolic (congestive) heart failure: Secondary | ICD-10-CM | POA: Diagnosis not present

## 2019-09-06 DIAGNOSIS — N186 End stage renal disease: Secondary | ICD-10-CM | POA: Diagnosis not present

## 2019-09-06 DIAGNOSIS — S41111D Laceration without foreign body of right upper arm, subsequent encounter: Secondary | ICD-10-CM | POA: Diagnosis not present

## 2019-09-06 DIAGNOSIS — K589 Irritable bowel syndrome without diarrhea: Secondary | ICD-10-CM | POA: Diagnosis not present

## 2019-09-10 ENCOUNTER — Encounter: Payer: Self-pay | Admitting: Internal Medicine

## 2019-09-10 ENCOUNTER — Other Ambulatory Visit: Payer: Medicare HMO | Admitting: Internal Medicine

## 2019-09-10 ENCOUNTER — Other Ambulatory Visit: Payer: Self-pay

## 2019-09-10 DIAGNOSIS — Z7189 Other specified counseling: Secondary | ICD-10-CM

## 2019-09-10 DIAGNOSIS — L89322 Pressure ulcer of left buttock, stage 2: Secondary | ICD-10-CM | POA: Diagnosis not present

## 2019-09-10 DIAGNOSIS — I5042 Chronic combined systolic (congestive) and diastolic (congestive) heart failure: Secondary | ICD-10-CM | POA: Diagnosis not present

## 2019-09-10 DIAGNOSIS — K589 Irritable bowel syndrome without diarrhea: Secondary | ICD-10-CM | POA: Diagnosis not present

## 2019-09-10 DIAGNOSIS — E1122 Type 2 diabetes mellitus with diabetic chronic kidney disease: Secondary | ICD-10-CM | POA: Diagnosis not present

## 2019-09-10 DIAGNOSIS — S41111D Laceration without foreign body of right upper arm, subsequent encounter: Secondary | ICD-10-CM | POA: Diagnosis not present

## 2019-09-10 DIAGNOSIS — Z515 Encounter for palliative care: Secondary | ICD-10-CM

## 2019-09-10 DIAGNOSIS — E785 Hyperlipidemia, unspecified: Secondary | ICD-10-CM | POA: Diagnosis not present

## 2019-09-10 DIAGNOSIS — I2510 Atherosclerotic heart disease of native coronary artery without angina pectoris: Secondary | ICD-10-CM | POA: Diagnosis not present

## 2019-09-10 DIAGNOSIS — N186 End stage renal disease: Secondary | ICD-10-CM | POA: Diagnosis not present

## 2019-09-10 DIAGNOSIS — I69351 Hemiplegia and hemiparesis following cerebral infarction affecting right dominant side: Secondary | ICD-10-CM | POA: Diagnosis not present

## 2019-09-10 DIAGNOSIS — I951 Orthostatic hypotension: Secondary | ICD-10-CM | POA: Diagnosis not present

## 2019-09-10 NOTE — Progress Notes (Signed)
Jan 26th, 2021 [facility] Palliative Care Consult Note Telephone: [phone]  Fax: [phone]  PATIENT NAME: [name] DOB: [birth year] MRN: [id number] [address] [place], [place] [address]  PRIMARY CARE PROVIDER:    [name], MD [address] [place],  [place] [address]  REFERRING PROVIDER:  Dr. Loralie Champagne  RESPONSIBLE PARTY: (spouse) [name] [phone] or [id number]  ASSESSMENT / RECOMMENDATIONS:  1. Advance Care Planning: A. Directives: Patient had completed MOST form and DNR at the hospital, but did not have home copies. I verified and completed duplicate MOST and DNR (#2 copies) forms and left them in the home for their records. We discussed keeping one copy of the DNR form on the fridge, and bringing the other with her when she leaves the home, such as for dialysis.   MOST details:  DNR/DNI. Limited Scope of Medical Interventions. Yes to Antibiotics and IVFs, no to Feeding Tube. B. Goals of Care: To continue to work hard with PT/OT so that she can goal to self-transfer to wheelchair and toilet, and propel herself in her wheelchair.  2. Cognitive / Functional status:  Patient is alert and oriented X 3. She is dependent for all ADLs but can feed herself with her left hand. Unable to use R hand which is her dominant. RIght hemiparesis. R hand splint; PT plans to ask Dr. Redmond Pulling if he will order an orthotic for R ankle/foot. She is starting to develop R foot drop. Patient is continent of bowel and bladder; paid care giver assists patient to bed side commode. Patient goes to dialysis Mon-Wed-Fir. Patient gets weighted dialysis days; most recent weight is 115lbs. At height of 5'8" her BMI is 17.3kg/m2. Improved appetite; consumes 100% of 3 meals/day. Bout of diarrhea recently managed with a few Imodium; more normal BM yesterday. Occasional neck pain well managed with Tylenol (averages 2-3/week). She has LE  neuropathy which was well managed with Gabapentin. However, Dr. Redmond Pulling has this on hold for now. Patient will double check about potentially restarting this when she sees him at tomorrow's appointment. No skin breakdown, d/t excellent care by paid care provider, and patient's husband. Wears heal protectors, and pad applied to sacral area. Geri sleeves d/t easy to tear skin. Consistently turned q 2 hrs. Has a hospital bed, and specialized, "tilt in space" padded wheelchair that she loves.  3 . Associate Professor Supports: Home Health: RN, PT, OT, ST via [facility], DME: tilt-in-space wheelchair, cushion, hospital bed, hoyer lift and drop arm commode via [employer]. Patient has a paid caregiver Vonee Boatwright [phone]) M-F 8a-3 or 4pm. She will come in on the weekends if needed. Patient's spouse assists as well. Son Najah Moala is very involved in care; lives 15 min away and "will get [place] anything we need". [place] purchased handicapped Lucianne Lei in which patient's spouse transports patient to dialysis/Dr. visits. [name] is originally from [place]. Worked for [employer] for 34 yrs. Married to [employer] for over 49 yrs. [name] enjoys time with her caregiver, husband, and her rescue miniature poodle [name].   4. Follow up Palliative Care Visit: Tues 10/15/2019 @9am . I spent 60 minutes providing this consultation from noon to 1pm. More than 50% of the time in this consultation was spent coordinating communication.   HISTORY OF PRESENT ILLNESS:  [name] is a 79 y.o.  female with hypertension, hyperlipidemia, diabetes mellitus, GI bleed with IBS, congestive diastolic heart failure, end-stage renal  disease with hemodialysis Monday Wednesday Fridays, CAD with CABG 2010, aortic stenosis, history of right thalamic CVA 2018, tobacco abuse.   12/8-12/06/2019 Hospitalized for L anterior cerebral artery CVA. CT angio: significant intracranial and extracranial atherosclerotic dz. Echo cardiogram: EF 30%, R atriam oscillating density  vegetation vs thrombus (Plavix x 3 wks then aspirin). 07/27/2019-08/24/2019 [facility]). TEE 08/02/2019: no mass or thrombus.Hgb H1c: 5.3.   Palliative Care was asked to help address goals of care.   CODE STATUS: DNR  PPS: 30%  HOSPICE ELIGIBILITY/DIAGNOSIS: TBD  PAST MEDICAL HISTORY:  Past Medical History:  Diagnosis Date  . Anemia    Pt is taking iron.   [name] Anxiety   . Arthritis   . Carotid stenosis    40-59% bilateral ICA stenosis in 2/12.  . Chronic low back pain   . CKD (chronic kidney disease)    Dr. Audie Clear at [facility]  . Coronary artery disease    Pt presented 2/10 to [facility] with NSTEMI and diastolic CHF exacerbation.  LHC was done  3/10 showing 99% pRCA stenosis and 80% calcified pLAD stenosis with L=>R collaterals.  Pt was referred  for CABG which was done by Dr. Prescott Gum with LIMA-LAD, SVG-RCA, SVG-OM.  . Diabetes mellitus   . Diabetic neuropathy ([place])   . Diastolic CHF ([facility])    Echo (2/10) showed EF 55-65%, mild LVH, diastolic dysfunction, mild AS with mean gradient 12 mmHg, PASP 43 mmHg.  Echo (2/12): EF 55-60%, mild LVH, mild AS (mean gradient 12), PA systolic pressure 32 mmHg.     [name] GERD (gastroesophageal reflux disease)   . Heart murmur   . Hyperlipidemia   . Hypertension   . Mild aortic stenosis    mean gradient 12 mmHg in 2/12.  . Myocardial infarction ([place])    "mild"  . Pneumonia   . PONV (postoperative nausea and vomiting)   . Stroke [facility])    " mild"  . Thrombocytopenia ([place])   . Unsteady gait     SOCIAL HX:  Social History   Tobacco Use  . Smoking status: Former Smoker    Types: Cigarettes  . Smokeless tobacco: Never Used  . Tobacco comment: quit 1988  Substance Use Topics  . Alcohol use: Yes    Comment: daily    ALLERGIES:  Allergies  Allergen Reactions  . Doxycycline Nausea And Vomiting    Caused "DEATHLY NAUSEA AND VOMITING"  . Lipitor [Atorvastatin] Other (See Comments)    Stomach pain  . Strawberry Extract  Rash     PERTINENT MEDICATIONS:  Outpatient Encounter Medications as of 09/10/2019  Medication Sig  . acetaminophen (TYLENOL) 325 MG tablet Take 2 tablets (650 mg total) by mouth every 4 (four) hours as needed for mild pain (or temp > 37.5 C (99.5 F)).  [name] ALPRAZolam (XANAX) 0.25 MG tablet Take 1 tablet (0.25 mg total) by mouth at bedtime as needed for anxiety.  [name] aspirin EC 81 MG EC tablet Take 1 tablet (81 mg total) by mouth daily.  . folic acid (FOLVITE) 1 MG tablet Take 1 tablet (1 mg total) by mouth daily.  . miconazole nitrate (MICATIN) POWD Apply 1 application topically 2 (two) times daily.  . midodrine (PROAMATINE) 10 MG tablet Take 1 tablet (10 mg total) by mouth 3 (three) times a week. Patient takes on dialysis days, M,W,F  . multivitamin (RENA-VIT) TABS tablet Take 1 tablet by mouth at bedtime.  . pantoprazole (PROTONIX) 40 MG tablet Take 1 tablet (40  mg total) by mouth 2 (two) times daily.  . rosuvastatin (CRESTOR) 20 MG tablet Take 1 tablet (20 mg total) by mouth daily at 6 PM.  . saccharomyces boulardii (FLORASTOR) 250 MG capsule Take 1 capsule (250 mg total) by mouth 2 (two) times daily.  . vitamin B-12 (CYANOCOBALAMIN) 500 MCG tablet Take 1 tablet (500 mcg total) by mouth daily.   No facility-administered encounter medications on file as of 09/10/2019.    PHYSICAL EXAM:   General: NAD, frail appearing, thin. Very sweet, pleasantly conversant. Husband and paid caregiver in attendance. PE deferred to limit COVID exposure Extremities: no edema, no joint deformities. R side extremities flacid Skin: no rashes Neurological: Weakness but otherwise nonfocal  Julianne Handler, NP

## 2019-09-12 DIAGNOSIS — S41111D Laceration without foreign body of right upper arm, subsequent encounter: Secondary | ICD-10-CM | POA: Diagnosis not present

## 2019-09-12 DIAGNOSIS — Z09 Encounter for follow-up examination after completed treatment for conditions other than malignant neoplasm: Secondary | ICD-10-CM | POA: Diagnosis not present

## 2019-09-12 DIAGNOSIS — I63322 Cerebral infarction due to thrombosis of left anterior cerebral artery: Secondary | ICD-10-CM | POA: Diagnosis not present

## 2019-09-12 DIAGNOSIS — I951 Orthostatic hypotension: Secondary | ICD-10-CM | POA: Diagnosis not present

## 2019-09-12 DIAGNOSIS — K589 Irritable bowel syndrome without diarrhea: Secondary | ICD-10-CM | POA: Diagnosis not present

## 2019-09-12 DIAGNOSIS — E785 Hyperlipidemia, unspecified: Secondary | ICD-10-CM | POA: Diagnosis not present

## 2019-09-12 DIAGNOSIS — G8191 Hemiplegia, unspecified affecting right dominant side: Secondary | ICD-10-CM | POA: Diagnosis not present

## 2019-09-12 DIAGNOSIS — I5042 Chronic combined systolic (congestive) and diastolic (congestive) heart failure: Secondary | ICD-10-CM | POA: Diagnosis not present

## 2019-09-12 DIAGNOSIS — H65191 Other acute nonsuppurative otitis media, right ear: Secondary | ICD-10-CM | POA: Diagnosis not present

## 2019-09-12 DIAGNOSIS — I2510 Atherosclerotic heart disease of native coronary artery without angina pectoris: Secondary | ICD-10-CM | POA: Diagnosis not present

## 2019-09-12 DIAGNOSIS — E1122 Type 2 diabetes mellitus with diabetic chronic kidney disease: Secondary | ICD-10-CM | POA: Diagnosis not present

## 2019-09-12 DIAGNOSIS — N186 End stage renal disease: Secondary | ICD-10-CM | POA: Diagnosis not present

## 2019-09-12 DIAGNOSIS — I69351 Hemiplegia and hemiparesis following cerebral infarction affecting right dominant side: Secondary | ICD-10-CM | POA: Diagnosis not present

## 2019-09-12 DIAGNOSIS — L89322 Pressure ulcer of left buttock, stage 2: Secondary | ICD-10-CM | POA: Diagnosis not present

## 2019-09-15 DIAGNOSIS — N186 End stage renal disease: Secondary | ICD-10-CM | POA: Diagnosis not present

## 2019-09-15 DIAGNOSIS — I129 Hypertensive chronic kidney disease with stage 1 through stage 4 chronic kidney disease, or unspecified chronic kidney disease: Secondary | ICD-10-CM | POA: Diagnosis not present

## 2019-09-15 DIAGNOSIS — Z992 Dependence on renal dialysis: Secondary | ICD-10-CM | POA: Diagnosis not present

## 2019-09-16 DIAGNOSIS — N186 End stage renal disease: Secondary | ICD-10-CM | POA: Diagnosis not present

## 2019-09-16 DIAGNOSIS — N2581 Secondary hyperparathyroidism of renal origin: Secondary | ICD-10-CM | POA: Diagnosis not present

## 2019-09-16 DIAGNOSIS — D689 Coagulation defect, unspecified: Secondary | ICD-10-CM | POA: Diagnosis not present

## 2019-09-16 DIAGNOSIS — D631 Anemia in chronic kidney disease: Secondary | ICD-10-CM | POA: Diagnosis not present

## 2019-09-16 DIAGNOSIS — Z992 Dependence on renal dialysis: Secondary | ICD-10-CM | POA: Diagnosis not present

## 2019-09-16 DIAGNOSIS — I129 Hypertensive chronic kidney disease with stage 1 through stage 4 chronic kidney disease, or unspecified chronic kidney disease: Secondary | ICD-10-CM | POA: Diagnosis not present

## 2019-09-17 DIAGNOSIS — N186 End stage renal disease: Secondary | ICD-10-CM | POA: Diagnosis not present

## 2019-09-17 DIAGNOSIS — I951 Orthostatic hypotension: Secondary | ICD-10-CM | POA: Diagnosis not present

## 2019-09-17 DIAGNOSIS — L89322 Pressure ulcer of left buttock, stage 2: Secondary | ICD-10-CM | POA: Diagnosis not present

## 2019-09-17 DIAGNOSIS — I69351 Hemiplegia and hemiparesis following cerebral infarction affecting right dominant side: Secondary | ICD-10-CM | POA: Diagnosis not present

## 2019-09-17 DIAGNOSIS — E1122 Type 2 diabetes mellitus with diabetic chronic kidney disease: Secondary | ICD-10-CM | POA: Diagnosis not present

## 2019-09-17 DIAGNOSIS — S41111D Laceration without foreign body of right upper arm, subsequent encounter: Secondary | ICD-10-CM | POA: Diagnosis not present

## 2019-09-17 DIAGNOSIS — I5042 Chronic combined systolic (congestive) and diastolic (congestive) heart failure: Secondary | ICD-10-CM | POA: Diagnosis not present

## 2019-09-17 DIAGNOSIS — I2510 Atherosclerotic heart disease of native coronary artery without angina pectoris: Secondary | ICD-10-CM | POA: Diagnosis not present

## 2019-09-17 DIAGNOSIS — E785 Hyperlipidemia, unspecified: Secondary | ICD-10-CM | POA: Diagnosis not present

## 2019-09-17 DIAGNOSIS — K589 Irritable bowel syndrome without diarrhea: Secondary | ICD-10-CM | POA: Diagnosis not present

## 2019-09-19 DIAGNOSIS — E785 Hyperlipidemia, unspecified: Secondary | ICD-10-CM | POA: Diagnosis not present

## 2019-09-19 DIAGNOSIS — L89322 Pressure ulcer of left buttock, stage 2: Secondary | ICD-10-CM | POA: Diagnosis not present

## 2019-09-19 DIAGNOSIS — I2510 Atherosclerotic heart disease of native coronary artery without angina pectoris: Secondary | ICD-10-CM | POA: Diagnosis not present

## 2019-09-19 DIAGNOSIS — I69351 Hemiplegia and hemiparesis following cerebral infarction affecting right dominant side: Secondary | ICD-10-CM | POA: Diagnosis not present

## 2019-09-19 DIAGNOSIS — I5042 Chronic combined systolic (congestive) and diastolic (congestive) heart failure: Secondary | ICD-10-CM | POA: Diagnosis not present

## 2019-09-19 DIAGNOSIS — E1122 Type 2 diabetes mellitus with diabetic chronic kidney disease: Secondary | ICD-10-CM | POA: Diagnosis not present

## 2019-09-19 DIAGNOSIS — K589 Irritable bowel syndrome without diarrhea: Secondary | ICD-10-CM | POA: Diagnosis not present

## 2019-09-19 DIAGNOSIS — N186 End stage renal disease: Secondary | ICD-10-CM | POA: Diagnosis not present

## 2019-09-19 DIAGNOSIS — S41111D Laceration without foreign body of right upper arm, subsequent encounter: Secondary | ICD-10-CM | POA: Diagnosis not present

## 2019-09-19 DIAGNOSIS — I951 Orthostatic hypotension: Secondary | ICD-10-CM | POA: Diagnosis not present

## 2019-09-23 DIAGNOSIS — I120 Hypertensive chronic kidney disease with stage 5 chronic kidney disease or end stage renal disease: Secondary | ICD-10-CM | POA: Diagnosis not present

## 2019-09-23 DIAGNOSIS — I63349 Cerebral infarction due to thrombosis of unspecified cerebellar artery: Secondary | ICD-10-CM | POA: Diagnosis not present

## 2019-09-24 DIAGNOSIS — E785 Hyperlipidemia, unspecified: Secondary | ICD-10-CM | POA: Diagnosis not present

## 2019-09-24 DIAGNOSIS — K589 Irritable bowel syndrome without diarrhea: Secondary | ICD-10-CM | POA: Diagnosis not present

## 2019-09-24 DIAGNOSIS — L89322 Pressure ulcer of left buttock, stage 2: Secondary | ICD-10-CM | POA: Diagnosis not present

## 2019-09-24 DIAGNOSIS — I951 Orthostatic hypotension: Secondary | ICD-10-CM | POA: Diagnosis not present

## 2019-09-24 DIAGNOSIS — I5042 Chronic combined systolic (congestive) and diastolic (congestive) heart failure: Secondary | ICD-10-CM | POA: Diagnosis not present

## 2019-09-24 DIAGNOSIS — E1122 Type 2 diabetes mellitus with diabetic chronic kidney disease: Secondary | ICD-10-CM | POA: Diagnosis not present

## 2019-09-24 DIAGNOSIS — I69351 Hemiplegia and hemiparesis following cerebral infarction affecting right dominant side: Secondary | ICD-10-CM | POA: Diagnosis not present

## 2019-09-24 DIAGNOSIS — S41111D Laceration without foreign body of right upper arm, subsequent encounter: Secondary | ICD-10-CM | POA: Diagnosis not present

## 2019-09-24 DIAGNOSIS — N186 End stage renal disease: Secondary | ICD-10-CM | POA: Diagnosis not present

## 2019-09-24 DIAGNOSIS — I2510 Atherosclerotic heart disease of native coronary artery without angina pectoris: Secondary | ICD-10-CM | POA: Diagnosis not present

## 2019-09-26 DIAGNOSIS — N186 End stage renal disease: Secondary | ICD-10-CM | POA: Diagnosis not present

## 2019-09-26 DIAGNOSIS — E1122 Type 2 diabetes mellitus with diabetic chronic kidney disease: Secondary | ICD-10-CM | POA: Diagnosis not present

## 2019-09-26 DIAGNOSIS — K589 Irritable bowel syndrome without diarrhea: Secondary | ICD-10-CM | POA: Diagnosis not present

## 2019-09-26 DIAGNOSIS — I69351 Hemiplegia and hemiparesis following cerebral infarction affecting right dominant side: Secondary | ICD-10-CM | POA: Diagnosis not present

## 2019-09-26 DIAGNOSIS — E785 Hyperlipidemia, unspecified: Secondary | ICD-10-CM | POA: Diagnosis not present

## 2019-09-26 DIAGNOSIS — I5042 Chronic combined systolic (congestive) and diastolic (congestive) heart failure: Secondary | ICD-10-CM | POA: Diagnosis not present

## 2019-09-26 DIAGNOSIS — L89322 Pressure ulcer of left buttock, stage 2: Secondary | ICD-10-CM | POA: Diagnosis not present

## 2019-09-26 DIAGNOSIS — I2510 Atherosclerotic heart disease of native coronary artery without angina pectoris: Secondary | ICD-10-CM | POA: Diagnosis not present

## 2019-09-26 DIAGNOSIS — I951 Orthostatic hypotension: Secondary | ICD-10-CM | POA: Diagnosis not present

## 2019-09-26 DIAGNOSIS — S41111D Laceration without foreign body of right upper arm, subsequent encounter: Secondary | ICD-10-CM | POA: Diagnosis not present

## 2019-09-27 DIAGNOSIS — L89322 Pressure ulcer of left buttock, stage 2: Secondary | ICD-10-CM | POA: Diagnosis not present

## 2019-09-27 DIAGNOSIS — N186 End stage renal disease: Secondary | ICD-10-CM | POA: Diagnosis not present

## 2019-09-27 DIAGNOSIS — E1122 Type 2 diabetes mellitus with diabetic chronic kidney disease: Secondary | ICD-10-CM | POA: Diagnosis not present

## 2019-09-27 DIAGNOSIS — K589 Irritable bowel syndrome without diarrhea: Secondary | ICD-10-CM | POA: Diagnosis not present

## 2019-09-27 DIAGNOSIS — E785 Hyperlipidemia, unspecified: Secondary | ICD-10-CM | POA: Diagnosis not present

## 2019-09-27 DIAGNOSIS — S41111D Laceration without foreign body of right upper arm, subsequent encounter: Secondary | ICD-10-CM | POA: Diagnosis not present

## 2019-09-27 DIAGNOSIS — I69351 Hemiplegia and hemiparesis following cerebral infarction affecting right dominant side: Secondary | ICD-10-CM | POA: Diagnosis not present

## 2019-09-27 DIAGNOSIS — I5042 Chronic combined systolic (congestive) and diastolic (congestive) heart failure: Secondary | ICD-10-CM | POA: Diagnosis not present

## 2019-09-27 DIAGNOSIS — I951 Orthostatic hypotension: Secondary | ICD-10-CM | POA: Diagnosis not present

## 2019-09-27 DIAGNOSIS — I2510 Atherosclerotic heart disease of native coronary artery without angina pectoris: Secondary | ICD-10-CM | POA: Diagnosis not present

## 2019-10-01 DIAGNOSIS — I2510 Atherosclerotic heart disease of native coronary artery without angina pectoris: Secondary | ICD-10-CM | POA: Diagnosis not present

## 2019-10-01 DIAGNOSIS — N186 End stage renal disease: Secondary | ICD-10-CM | POA: Diagnosis not present

## 2019-10-01 DIAGNOSIS — I951 Orthostatic hypotension: Secondary | ICD-10-CM | POA: Diagnosis not present

## 2019-10-01 DIAGNOSIS — S41111D Laceration without foreign body of right upper arm, subsequent encounter: Secondary | ICD-10-CM | POA: Diagnosis not present

## 2019-10-01 DIAGNOSIS — I5042 Chronic combined systolic (congestive) and diastolic (congestive) heart failure: Secondary | ICD-10-CM | POA: Diagnosis not present

## 2019-10-01 DIAGNOSIS — I69351 Hemiplegia and hemiparesis following cerebral infarction affecting right dominant side: Secondary | ICD-10-CM | POA: Diagnosis not present

## 2019-10-01 DIAGNOSIS — K589 Irritable bowel syndrome without diarrhea: Secondary | ICD-10-CM | POA: Diagnosis not present

## 2019-10-01 DIAGNOSIS — E1122 Type 2 diabetes mellitus with diabetic chronic kidney disease: Secondary | ICD-10-CM | POA: Diagnosis not present

## 2019-10-01 DIAGNOSIS — E785 Hyperlipidemia, unspecified: Secondary | ICD-10-CM | POA: Diagnosis not present

## 2019-10-01 DIAGNOSIS — L89322 Pressure ulcer of left buttock, stage 2: Secondary | ICD-10-CM | POA: Diagnosis not present

## 2019-10-02 ENCOUNTER — Other Ambulatory Visit: Payer: Self-pay

## 2019-10-02 ENCOUNTER — Emergency Department (HOSPITAL_COMMUNITY): Payer: Medicare HMO

## 2019-10-02 ENCOUNTER — Inpatient Hospital Stay (HOSPITAL_COMMUNITY)
Admission: EM | Admit: 2019-10-02 | Discharge: 2019-10-05 | DRG: 064 | Disposition: A | Discharge status: Hospice | Payer: Medicare HMO | Attending: Internal Medicine | Admitting: Internal Medicine

## 2019-10-02 ENCOUNTER — Encounter (HOSPITAL_COMMUNITY): Payer: Self-pay | Admitting: Internal Medicine

## 2019-10-02 DIAGNOSIS — R29711 NIHSS score 11: Secondary | ICD-10-CM | POA: Diagnosis present

## 2019-10-02 DIAGNOSIS — E11649 Type 2 diabetes mellitus with hypoglycemia without coma: Secondary | ICD-10-CM | POA: Diagnosis present

## 2019-10-02 DIAGNOSIS — R52 Pain, unspecified: Secondary | ICD-10-CM | POA: Diagnosis not present

## 2019-10-02 DIAGNOSIS — I252 Old myocardial infarction: Secondary | ICD-10-CM

## 2019-10-02 DIAGNOSIS — N186 End stage renal disease: Secondary | ICD-10-CM | POA: Diagnosis present

## 2019-10-02 DIAGNOSIS — Z7189 Other specified counseling: Secondary | ICD-10-CM | POA: Diagnosis not present

## 2019-10-02 DIAGNOSIS — G8929 Other chronic pain: Secondary | ICD-10-CM | POA: Diagnosis present

## 2019-10-02 DIAGNOSIS — E785 Hyperlipidemia, unspecified: Secondary | ICD-10-CM | POA: Diagnosis present

## 2019-10-02 DIAGNOSIS — Z20822 Contact with and (suspected) exposure to covid-19: Secondary | ICD-10-CM | POA: Diagnosis present

## 2019-10-02 DIAGNOSIS — I63422 Cerebral infarction due to embolism of left anterior cerebral artery: Principal | ICD-10-CM | POA: Diagnosis present

## 2019-10-02 DIAGNOSIS — I493 Ventricular premature depolarization: Secondary | ICD-10-CM | POA: Diagnosis present

## 2019-10-02 DIAGNOSIS — I2510 Atherosclerotic heart disease of native coronary artery without angina pectoris: Secondary | ICD-10-CM | POA: Diagnosis present

## 2019-10-02 DIAGNOSIS — R778 Other specified abnormalities of plasma proteins: Secondary | ICD-10-CM

## 2019-10-02 DIAGNOSIS — Z66 Do not resuscitate: Secondary | ICD-10-CM | POA: Diagnosis present

## 2019-10-02 DIAGNOSIS — I5040 Unspecified combined systolic (congestive) and diastolic (congestive) heart failure: Secondary | ICD-10-CM | POA: Diagnosis not present

## 2019-10-02 DIAGNOSIS — I639 Cerebral infarction, unspecified: Secondary | ICD-10-CM | POA: Diagnosis not present

## 2019-10-02 DIAGNOSIS — Z79899 Other long term (current) drug therapy: Secondary | ICD-10-CM

## 2019-10-02 DIAGNOSIS — K219 Gastro-esophageal reflux disease without esophagitis: Secondary | ICD-10-CM | POA: Diagnosis present

## 2019-10-02 DIAGNOSIS — Z992 Dependence on renal dialysis: Secondary | ICD-10-CM

## 2019-10-02 DIAGNOSIS — Z881 Allergy status to other antibiotic agents status: Secondary | ICD-10-CM

## 2019-10-02 DIAGNOSIS — Z03818 Encounter for observation for suspected exposure to other biological agents ruled out: Secondary | ICD-10-CM | POA: Diagnosis not present

## 2019-10-02 DIAGNOSIS — F419 Anxiety disorder, unspecified: Secondary | ICD-10-CM | POA: Diagnosis present

## 2019-10-02 DIAGNOSIS — I082 Rheumatic disorders of both aortic and tricuspid valves: Secondary | ICD-10-CM | POA: Diagnosis present

## 2019-10-02 DIAGNOSIS — E8889 Other specified metabolic disorders: Secondary | ICD-10-CM | POA: Diagnosis present

## 2019-10-02 DIAGNOSIS — R4182 Altered mental status, unspecified: Secondary | ICD-10-CM | POA: Diagnosis not present

## 2019-10-02 DIAGNOSIS — M545 Low back pain: Secondary | ICD-10-CM | POA: Diagnosis present

## 2019-10-02 DIAGNOSIS — I6523 Occlusion and stenosis of bilateral carotid arteries: Secondary | ICD-10-CM | POA: Diagnosis present

## 2019-10-02 DIAGNOSIS — I499 Cardiac arrhythmia, unspecified: Secondary | ICD-10-CM | POA: Diagnosis not present

## 2019-10-02 DIAGNOSIS — I10 Essential (primary) hypertension: Secondary | ICD-10-CM | POA: Diagnosis present

## 2019-10-02 DIAGNOSIS — M2550 Pain in unspecified joint: Secondary | ICD-10-CM | POA: Diagnosis not present

## 2019-10-02 DIAGNOSIS — Z7401 Bed confinement status: Secondary | ICD-10-CM | POA: Diagnosis not present

## 2019-10-02 DIAGNOSIS — I69351 Hemiplegia and hemiparesis following cerebral infarction affecting right dominant side: Secondary | ICD-10-CM | POA: Diagnosis not present

## 2019-10-02 DIAGNOSIS — J9600 Acute respiratory failure, unspecified whether with hypoxia or hypercapnia: Secondary | ICD-10-CM | POA: Diagnosis not present

## 2019-10-02 DIAGNOSIS — Z951 Presence of aortocoronary bypass graft: Secondary | ICD-10-CM | POA: Diagnosis not present

## 2019-10-02 DIAGNOSIS — I132 Hypertensive heart and chronic kidney disease with heart failure and with stage 5 chronic kidney disease, or end stage renal disease: Secondary | ICD-10-CM | POA: Diagnosis not present

## 2019-10-02 DIAGNOSIS — I472 Ventricular tachycardia: Secondary | ICD-10-CM | POA: Diagnosis not present

## 2019-10-02 DIAGNOSIS — E1140 Type 2 diabetes mellitus with diabetic neuropathy, unspecified: Secondary | ICD-10-CM | POA: Diagnosis present

## 2019-10-02 DIAGNOSIS — D631 Anemia in chronic kidney disease: Secondary | ICD-10-CM | POA: Diagnosis present

## 2019-10-02 DIAGNOSIS — I509 Heart failure, unspecified: Secondary | ICD-10-CM | POA: Diagnosis not present

## 2019-10-02 DIAGNOSIS — E1165 Type 2 diabetes mellitus with hyperglycemia: Secondary | ICD-10-CM | POA: Diagnosis not present

## 2019-10-02 DIAGNOSIS — R000 Tachycardia, unspecified: Secondary | ICD-10-CM | POA: Diagnosis not present

## 2019-10-02 DIAGNOSIS — I129 Hypertensive chronic kidney disease with stage 1 through stage 4 chronic kidney disease, or unspecified chronic kidney disease: Secondary | ICD-10-CM | POA: Diagnosis not present

## 2019-10-02 DIAGNOSIS — D696 Thrombocytopenia, unspecified: Secondary | ICD-10-CM | POA: Diagnosis present

## 2019-10-02 DIAGNOSIS — I5032 Chronic diastolic (congestive) heart failure: Secondary | ICD-10-CM | POA: Diagnosis present

## 2019-10-02 DIAGNOSIS — Z87891 Personal history of nicotine dependence: Secondary | ICD-10-CM

## 2019-10-02 DIAGNOSIS — I959 Hypotension, unspecified: Secondary | ICD-10-CM | POA: Diagnosis present

## 2019-10-02 DIAGNOSIS — G3189 Other specified degenerative diseases of nervous system: Secondary | ICD-10-CM | POA: Diagnosis not present

## 2019-10-02 DIAGNOSIS — I6381 Other cerebral infarction due to occlusion or stenosis of small artery: Secondary | ICD-10-CM | POA: Diagnosis not present

## 2019-10-02 DIAGNOSIS — Z7982 Long term (current) use of aspirin: Secondary | ICD-10-CM

## 2019-10-02 DIAGNOSIS — I5042 Chronic combined systolic (congestive) and diastolic (congestive) heart failure: Secondary | ICD-10-CM | POA: Diagnosis present

## 2019-10-02 DIAGNOSIS — I350 Nonrheumatic aortic (valve) stenosis: Secondary | ICD-10-CM | POA: Diagnosis present

## 2019-10-02 DIAGNOSIS — Z961 Presence of intraocular lens: Secondary | ICD-10-CM | POA: Diagnosis present

## 2019-10-02 DIAGNOSIS — Z9842 Cataract extraction status, left eye: Secondary | ICD-10-CM

## 2019-10-02 DIAGNOSIS — N250 Renal osteodystrophy: Secondary | ICD-10-CM | POA: Diagnosis not present

## 2019-10-02 DIAGNOSIS — Z91018 Allergy to other foods: Secondary | ICD-10-CM

## 2019-10-02 DIAGNOSIS — N2581 Secondary hyperparathyroidism of renal origin: Secondary | ICD-10-CM | POA: Diagnosis present

## 2019-10-02 DIAGNOSIS — E1122 Type 2 diabetes mellitus with diabetic chronic kidney disease: Secondary | ICD-10-CM | POA: Diagnosis present

## 2019-10-02 DIAGNOSIS — I4891 Unspecified atrial fibrillation: Secondary | ICD-10-CM | POA: Diagnosis present

## 2019-10-02 DIAGNOSIS — Z515 Encounter for palliative care: Secondary | ICD-10-CM | POA: Diagnosis not present

## 2019-10-02 DIAGNOSIS — H748X3 Other specified disorders of middle ear and mastoid, bilateral: Secondary | ICD-10-CM | POA: Diagnosis not present

## 2019-10-02 DIAGNOSIS — I619 Nontraumatic intracerebral hemorrhage, unspecified: Secondary | ICD-10-CM | POA: Diagnosis not present

## 2019-10-02 DIAGNOSIS — R410 Disorientation, unspecified: Secondary | ICD-10-CM | POA: Diagnosis not present

## 2019-10-02 DIAGNOSIS — G9389 Other specified disorders of brain: Secondary | ICD-10-CM | POA: Diagnosis not present

## 2019-10-02 DIAGNOSIS — Z9841 Cataract extraction status, right eye: Secondary | ICD-10-CM

## 2019-10-02 DIAGNOSIS — I447 Left bundle-branch block, unspecified: Secondary | ICD-10-CM | POA: Diagnosis present

## 2019-10-02 DIAGNOSIS — Z888 Allergy status to other drugs, medicaments and biological substances status: Secondary | ICD-10-CM

## 2019-10-02 DIAGNOSIS — R4020 Unspecified coma: Secondary | ICD-10-CM | POA: Diagnosis not present

## 2019-10-02 DIAGNOSIS — R404 Transient alteration of awareness: Secondary | ICD-10-CM | POA: Diagnosis not present

## 2019-10-02 LAB — CBC WITH DIFFERENTIAL/PLATELET
Abs Immature Granulocytes: 0.08 10*3/uL — ABNORMAL HIGH (ref 0.00–0.07)
Basophils Absolute: 0.1 10*3/uL (ref 0.0–0.1)
Basophils Relative: 0 %
Eosinophils Absolute: 0.1 10*3/uL (ref 0.0–0.5)
Eosinophils Relative: 1 %
HCT: 31.9 % — ABNORMAL LOW (ref 36.0–46.0)
Hemoglobin: 9.4 g/dL — ABNORMAL LOW (ref 12.0–15.0)
Immature Granulocytes: 1 %
Lymphocytes Relative: 15 %
Lymphs Abs: 1.8 10*3/uL (ref 0.7–4.0)
MCH: 30.4 pg (ref 26.0–34.0)
MCHC: 29.5 g/dL — ABNORMAL LOW (ref 30.0–36.0)
MCV: 103.2 fL — ABNORMAL HIGH (ref 80.0–100.0)
Monocytes Absolute: 1.6 10*3/uL — ABNORMAL HIGH (ref 0.1–1.0)
Monocytes Relative: 13 %
Neutro Abs: 8.4 10*3/uL — ABNORMAL HIGH (ref 1.7–7.7)
Neutrophils Relative %: 70 %
Platelets: 161 10*3/uL (ref 150–400)
RBC: 3.09 MIL/uL — ABNORMAL LOW (ref 3.87–5.11)
RDW: 19.7 % — ABNORMAL HIGH (ref 11.5–15.5)
WBC: 11.9 10*3/uL — ABNORMAL HIGH (ref 4.0–10.5)
nRBC: 0 % (ref 0.0–0.2)

## 2019-10-02 LAB — TROPONIN I (HIGH SENSITIVITY): Troponin I (High Sensitivity): 1597 ng/L (ref <18)

## 2019-10-02 LAB — COMPREHENSIVE METABOLIC PANEL
ALT: 14 U/L (ref 0–44)
AST: 21 U/L (ref 15–41)
Albumin: 2 g/dL — ABNORMAL LOW (ref 3.5–5.0)
Alkaline Phosphatase: 82 U/L (ref 38–126)
Anion gap: 17 — ABNORMAL HIGH (ref 5–15)
BUN: 49 mg/dL — ABNORMAL HIGH (ref 8–23)
CO2: 24 mmol/L (ref 22–32)
Calcium: 8.6 mg/dL — ABNORMAL LOW (ref 8.9–10.3)
Chloride: 95 mmol/L — ABNORMAL LOW (ref 98–111)
Creatinine, Ser: 5.03 mg/dL — ABNORMAL HIGH (ref 0.44–1.00)
GFR calc Af Amer: 9 mL/min — ABNORMAL LOW (ref >60)
GFR calc non Af Amer: 8 mL/min — ABNORMAL LOW (ref >60)
Glucose, Bld: 109 mg/dL — ABNORMAL HIGH (ref 70–99)
Potassium: 4.2 mmol/L (ref 3.5–5.1)
Sodium: 136 mmol/L (ref 135–145)
Total Bilirubin: 0.7 mg/dL (ref 0.3–1.2)
Total Protein: 6 g/dL — ABNORMAL LOW (ref 6.5–8.1)

## 2019-10-02 LAB — CBG MONITORING, ED: Glucose-Capillary: 95 mg/dL (ref 70–99)

## 2019-10-02 LAB — SARS CORONAVIRUS 2 (TAT 6-24 HRS): SARS Coronavirus 2: NEGATIVE

## 2019-10-02 LAB — GLUCOSE, CAPILLARY: Glucose-Capillary: 78 mg/dL (ref 70–99)

## 2019-10-02 MED ORDER — ACETAMINOPHEN 650 MG RE SUPP: 650.0000 mg | RECTAL | Status: DC | PRN

## 2019-10-02 MED ORDER — ACETAMINOPHEN 325 MG PO TABS
650.0000 mg | ORAL_TABLET | ORAL | Status: DC | PRN
  Administered 2019-10-03: 19:00:00 650 mg via ORAL
  Filled 2019-10-02 (×2): qty 2

## 2019-10-02 MED ORDER — INSULIN ASPART 100 UNIT/ML ~~LOC~~ SOLN
0.0000 [IU] | SUBCUTANEOUS | Status: DC
  Administered 2019-10-03: 21:00:00 1 [IU] via SUBCUTANEOUS
  Administered 2019-10-04: 04:00:00 3 [IU] via SUBCUTANEOUS
  Administered 2019-10-04: 21:00:00 1 [IU] via SUBCUTANEOUS
  Administered 2019-10-04: 01:00:00 3 [IU] via SUBCUTANEOUS
  Administered 2019-10-04: 18:00:00 1 [IU] via SUBCUTANEOUS
  Administered 2019-10-04 (×2): 2 [IU] via SUBCUTANEOUS
  Administered 2019-10-05 (×3): 1 [IU] via SUBCUTANEOUS

## 2019-10-02 MED ORDER — ONDANSETRON HCL 4 MG PO TABS: 4.0000 mg | ORAL_TABLET | Freq: Four times a day (QID) | ORAL | Status: DC | PRN

## 2019-10-02 MED ORDER — ACETAMINOPHEN 160 MG/5ML PO SOLN: 650.0000 mg | ORAL | Status: DC | PRN

## 2019-10-02 MED ORDER — ALBUMIN HUMAN 25 % IV SOLN
25.0000 g | Freq: Once | INTRAVENOUS | Status: AC
  Administered 2019-10-02: 23:00:00 25 g via INTRAVENOUS
  Filled 2019-10-02 (×2): qty 100

## 2019-10-02 MED ORDER — SODIUM CHLORIDE 0.9 % IV SOLN: INTRAVENOUS | Status: DC

## 2019-10-02 MED ORDER — SODIUM CHLORIDE 0.9 % IV SOLN
100.0000 mL | INTRAVENOUS | Status: DC | PRN
  Administered 2019-10-02: 19:00:00 100 mL via INTRAVENOUS

## 2019-10-02 MED ORDER — CAMPHOR-MENTHOL 0.5-0.5 % EX LOTN
1.0000 "application " | TOPICAL_LOTION | Freq: Three times a day (TID) | CUTANEOUS | Status: DC | PRN
  Filled 2019-10-02: qty 222

## 2019-10-02 MED ORDER — DOCUSATE SODIUM 283 MG RE ENEM
1.0000 | ENEMA | RECTAL | Status: DC | PRN
  Filled 2019-10-02: qty 1

## 2019-10-02 MED ORDER — CHLORHEXIDINE GLUCONATE CLOTH 2 % EX PADS
6.0000 | MEDICATED_PAD | Freq: Every day | CUTANEOUS | Status: DC
  Administered 2019-10-02 – 2019-10-05 (×3): 6 via TOPICAL

## 2019-10-02 MED ORDER — SENNOSIDES-DOCUSATE SODIUM 8.6-50 MG PO TABS: 1.0000 | ORAL_TABLET | Freq: Every evening | ORAL | Status: DC | PRN

## 2019-10-02 MED ORDER — ALPRAZOLAM 0.25 MG PO TABS: 1.0000 mg | ORAL_TABLET | Freq: Every day | ORAL | Status: DC

## 2019-10-02 MED ORDER — DEXTROSE 10 % IV SOLN: INTRAVENOUS | Status: DC

## 2019-10-02 MED ORDER — ROSUVASTATIN CALCIUM 20 MG PO TABS
20.0000 mg | ORAL_TABLET | Freq: Every day | ORAL | Status: DC
  Administered 2019-10-03: 20 mg via ORAL
  Filled 2019-10-02: qty 1

## 2019-10-02 MED ORDER — STROKE: EARLY STAGES OF RECOVERY BOOK
Freq: Once | Status: DC
  Filled 2019-10-02: qty 1

## 2019-10-02 MED ORDER — CALCIUM CARBONATE ANTACID 1250 MG/5ML PO SUSP
500.0000 mg | Freq: Four times a day (QID) | ORAL | Status: DC | PRN
  Filled 2019-10-02: qty 5

## 2019-10-02 MED ORDER — INSULIN ASPART 100 UNIT/ML ~~LOC~~ SOLN: 0.0000 [IU] | Freq: Three times a day (TID) | SUBCUTANEOUS | Status: DC

## 2019-10-02 MED ORDER — SODIUM CHLORIDE 0.9 % IV BOLUS
500.0000 mL | Freq: Once | INTRAVENOUS | Status: AC
  Administered 2019-10-02: 20:00:00 500 mL via INTRAVENOUS

## 2019-10-02 MED ORDER — RENA-VITE PO TABS
1.0000 | ORAL_TABLET | Freq: Every day | ORAL | Status: DC
  Administered 2019-10-03: 21:00:00 1 via ORAL
  Filled 2019-10-02 (×2): qty 1

## 2019-10-02 MED ORDER — ONDANSETRON HCL 4 MG/2ML IJ SOLN: 4.0000 mg | Freq: Four times a day (QID) | INTRAMUSCULAR | Status: DC | PRN

## 2019-10-02 MED ORDER — SEVELAMER CARBONATE 800 MG PO TABS
1600.0000 mg | ORAL_TABLET | Freq: Three times a day (TID) | ORAL | Status: DC
  Administered 2019-10-03 – 2019-10-04 (×3): 1600 mg via ORAL
  Filled 2019-10-02 (×4): qty 2

## 2019-10-02 MED ORDER — ASPIRIN EC 81 MG PO TBEC
81.0000 mg | DELAYED_RELEASE_TABLET | Freq: Every day | ORAL | Status: DC
  Administered 2019-10-03 – 2019-10-05 (×3): 81 mg via ORAL
  Filled 2019-10-02 (×4): qty 1

## 2019-10-02 MED ORDER — ZOLPIDEM TARTRATE 5 MG PO TABS: 5.0000 mg | ORAL_TABLET | Freq: Every evening | ORAL | Status: DC | PRN

## 2019-10-02 MED ORDER — PANTOPRAZOLE SODIUM 40 MG PO TBEC
40.0000 mg | DELAYED_RELEASE_TABLET | Freq: Two times a day (BID) | ORAL | Status: DC
  Administered 2019-10-03 – 2019-10-05 (×5): 40 mg via ORAL
  Filled 2019-10-02 (×5): qty 1

## 2019-10-02 MED ORDER — SORBITOL 70 % SOLN
30.0000 mL | Status: DC | PRN
  Filled 2019-10-02: qty 30

## 2019-10-02 MED ORDER — HEPARIN SODIUM (PORCINE) 5000 UNIT/ML IJ SOLN
5000.0000 [IU] | Freq: Three times a day (TID) | INTRAMUSCULAR | Status: DC
  Administered 2019-10-02 – 2019-10-04 (×5): 5000 [IU] via SUBCUTANEOUS
  Filled 2019-10-02 (×5): qty 1

## 2019-10-02 MED ORDER — NEPRO/CARBSTEADY PO LIQD
237.0000 mL | Freq: Three times a day (TID) | ORAL | Status: DC | PRN
  Filled 2019-10-02: qty 237

## 2019-10-02 MED ORDER — HYDROXYZINE HCL 25 MG PO TABS: 25.0000 mg | ORAL_TABLET | Freq: Three times a day (TID) | ORAL | Status: DC | PRN

## 2019-10-02 MED ORDER — GABAPENTIN 100 MG PO CAPS
100.0000 mg | ORAL_CAPSULE | Freq: Every day | ORAL | Status: DC
  Administered 2019-10-03 – 2019-10-04 (×2): 100 mg via ORAL
  Filled 2019-10-02 (×2): qty 1

## 2019-10-02 MED ORDER — SEVELAMER CARBONATE 800 MG PO TABS: 1600.0000 mg | ORAL_TABLET | Freq: Three times a day (TID) | ORAL | Status: DC

## 2019-10-02 NOTE — H&P (Addendum)
History and Physical    TERRIS BODIN FWY:[id number] DOB: 05-15-1941 DOA: 10/02/2019  PCP: [name], MD Consultants:  Palliative care; Lorrene Reid - nephrology; Carlis Abbott - vascular; Gerhardt/McLean - cardiology Patient coming from:  Home - lives with husband; NOK: Tinesha, [place], [phone]  Chief Complaint: AMS  HPI: [name] is a 79 y.o. female with medical history significant of CVA; CAD; HTN; HLD; DM; chronic diastolic CHF; and ESRD on MWF HD presenting with AMS.  She was previously admitted from 12/8-12 with a CVA with right-sided hemiplegia and went to CIR through 1/9.  Palliative care was involved during her stay and they completed a MOST form.  The patient is uncertain why she is here and is oriented x 1.  Her husband reports that she returned home with her husband - she has good days and bad.  She is completely unable to use her right side.  Today, he wasn't able to get her to respond.  She was unable to hold her coffee cup.  Her husband would like for her to be aggressively evaluated and treated for stroke once again, although she does have DNR paperwork and so will not be a full code based on her previously-stated wishes.  MOST form from 08/23/19, she prefers to be DNR; with limited interventions but no ICU; no feeding tube; antibiotics and IVF are ok.     ED Course:  Recent hospitalization with CVA, R-hemiplegia.  Not waking up well today - ?Xanax.  Abnormal CT, MRI with new over old strokes.  Neurology will see.  She is due for HD today.  Review of Systems: Unable to effectively perform   Ambulatory Status:  Non-ambulatory since prior stroke  Past Medical History:  Diagnosis Date  . Anemia    Pt is taking iron.   [name] Anxiety   . Arthritis   . Carotid stenosis    40-59% bilateral ICA stenosis in 2/12.  . Chronic low back pain   . CKD (chronic kidney disease)    Dr. Audie Clear at [facility] Nephrology  . Coronary artery disease    Pt presented 2/10 to [facility] with  NSTEMI and diastolic CHF exacerbation.  LHC was done  3/10 showing 99% pRCA stenosis and 80% calcified pLAD stenosis with L=>R collaterals.  Pt was referred  for CABG which was done by Dr. Prescott Gum with LIMA-LAD, SVG-RCA, SVG-OM.  . Diabetes mellitus   . Diabetic neuropathy ([place])   . Diastolic CHF (HCC)    Echo (2/10) showed EF 55-65%, mild LVH, diastolic dysfunction, mild AS with mean gradient 12 mmHg, PASP 43 mmHg.  Echo (2/12): EF 55-60%, mild LVH, mild AS (mean gradient 12), PA systolic pressure 32 mmHg.     [name] GERD (gastroesophageal reflux disease)   . Heart murmur   . Hyperlipidemia   . Hypertension   . Mild aortic stenosis    mean gradient 12 mmHg in 2/12.  . Myocardial infarction ([place])    "mild"  . Pneumonia   . PONV (postoperative nausea and vomiting)   . Stroke [facility])    " mild"  . Thrombocytopenia ([place])   . Unsteady gait     Past Surgical History:  Procedure Laterality Date  . AORTIC ARCH ANGIOGRAPHY N/A 10/18/2018   Procedure: AORTIC ARCH ANGIOGRAPHY;  Surgeon: Marty Heck, MD;  Location: [place] CV LAB;  Service: Cardiovascular;  Laterality: N/A;  . AV FISTULA PLACEMENT Left 01/30/2015   Procedure: Creation of a Radial Cephalic AV Fistula  left wrist;  Surgeon: [name], MD;  Location: [place];  Service: Vascular;  Laterality: Left;  . AV FISTULA PLACEMENT Left 09/06/2018   Procedure: LEFT ARM ARTERIOVENOUS (AV) FISTULA CREATION;  Surgeon: Marty Heck, MD;  Location: [place];  Service: Vascular;  Laterality: Left;  . BACK SURGERY     multiple  . BREAST SURGERY     biopsy  . BUBBLE STUDY  08/02/2019   Procedure: BUBBLE STUDY;  Surgeon: Skeet Latch, MD;  Location: [place];  Service: Cardiovascular;;  . CARDIAC CATHETERIZATION    . CATARACT EXTRACTION W/ INTRAOCULAR LENS  IMPLANT, BILATERAL    . COLONOSCOPY Left 11/03/2016   Procedure: COLONOSCOPY;  Surgeon: [name], MD;  Location: [facility] ENDOSCOPY;  Service: Endoscopy;  Laterality: Left;    . COLONOSCOPY W/ BIOPSIES AND POLYPECTOMY    . CORONARY ARTERY BYPASS GRAFT  09/2008   pt with NSTEMI and diastolic CHF exacerbation.  LHC was done  3/10 showing 99% pRCA stenosis and 80% calcified pLAD stenosis with L=>R collaterals.  Pt was referred  for CABG which was done by Dr. Prescott Gum with LIMA-LAD, SVG-RCA, SVG-OM.  [name] ESOPHAGOGASTRODUODENOSCOPY N/A 11/02/2016   Procedure: ESOPHAGOGASTRODUODENOSCOPY (EGD);  Surgeon: [name], MD;  [facility]COPY;  Service: Endoscopy;  Laterality: N/A;  . GIVENS CAPSULE STUDY N/A 11/29/2016   Procedure: GIVENS CAPSULE STUDY;  Surgeon: [name], MD;  Location: [place];  Service: Endoscopy;  Laterality: N/A;  . LIGATION OF ARTERIOVENOUS  FISTULA Left 09/13/2018   Procedure: LIGATION OF LEFT ARTERIOVENOUS  FISTULA;  Surgeon: Waynetta Sandy, MD;  Location: [place];  Service: Vascular;  Laterality: Left;  . REVISON OF ARTERIOVENOUS FISTULA Left 07/02/2015   Procedure: REVISON OF LEFT RADIOCEPHALIC ARTERIOVENOUS FISTULA;  Surgeon: [name], MD;  Location: [place];  Service: Vascular;  Laterality: Left;  . RIGHT/LEFT HEART CATH AND CORONARY/GRAFT ANGIOGRAPHY N/A 05/18/2018   Procedure: RIGHT/LEFT HEART CATH AND CORONARY/GRAFT ANGIOGRAPHY;  Surgeon: Leonie Man, MD;  Location: [place] CV LAB;  Service: Cardiovascular;  Laterality: N/A;  . TEE WITHOUT CARDIOVERSION N/A 08/02/2019   Procedure: TRANSESOPHAGEAL ECHOCARDIOGRAM (TEE);  Surgeon: Skeet Latch, MD;  Location: [place];  Service: Cardiovascular;  Laterality: N/A;  . UPPER EXTREMITY ANGIOGRAPHY Left 10/18/2018   Procedure: UPPER EXTREMITY ANGIOGRAPHY;  Surgeon: Marty Heck, MD;  Location: [place] CV LAB;  Service: Cardiovascular;  Laterality: Left;    Social History   Socioeconomic History  . Marital status: Married    Spouse name: Linton Ham  . Number of children: 1  . Years of education: Not on file  . Highest education level: Not on file   Occupational History  . Occupation: retired from office work  Tobacco Use  . Smoking status: Former Smoker    Types: Cigarettes  . Smokeless tobacco: Never Used  . Tobacco comment: quit 1988  Substance and Sexual Activity  . Alcohol use: Yes    Comment: daily  . Drug use: No  . Sexual activity: Not Currently    Partners: Male  Other Topics Concern  . Not on file  Social History Narrative  . Not on file   Social Determinants of Health   Financial Resource Strain:   . Difficulty of Paying Living Expenses: Not on file  Food Insecurity:   . Worried About Charity fundraiser in the Last Year: Not on file  . Ran Out of Food in the Last Year: Not on file  Transportation Needs:   .  Lack of Transportation (Medical): Not on file  . Lack of Transportation (Non-Medical): Not on file  Physical Activity:   . Days of Exercise per Week: Not on file  . Minutes of Exercise per Session: Not on file  Stress:   . Feeling of Stress : Not on file  Social Connections:   . Frequency of Communication with Friends and Family: Not on file  . Frequency of Social Gatherings with Friends and Family: Not on file  . Attends Religious Services: Not on file  . Active Member of Clubs or Organizations: Not on file  . Attends Archivist Meetings: Not on file  . Marital Status: Not on file  Intimate Partner Violence:   . Fear of Current or Ex-Partner: Not on file  . Emotionally Abused: Not on file  . Physically Abused: Not on file  . Sexually Abused: Not on file    Allergies  Allergen Reactions  . Doxycycline Nausea And Vomiting    Caused "DEATHLY NAUSEA AND VOMITING"  . Lipitor [Atorvastatin] Other (See Comments)    Stomach pain  . Strawberry Extract Rash    Family History  Adopted: Yes  Family history unknown: Yes    Prior to Admission medications   Medication Sig Start Date End Date Taking? Authorizing Provider  acetaminophen (TYLENOL) 325 MG tablet Take 2 tablets (650 mg  total) by mouth every 4 (four) hours as needed for mild pain (or temp > 37.5 C (99.5 F)). 08/22/19  Yes Angiulli, Lavon Paganini, PA-C  ALPRAZolam Duanne Moron) 1 MG tablet Take 1 mg by mouth at bedtime. 09/16/19  Yes [provider]  aspirin EC 81 MG EC tablet Take 1 tablet (81 mg total) by mouth daily. 07/27/19  Yes Florencia Reasons, MD  folic acid (FOLVITE) 1 MG tablet Take 1 tablet (1 mg total) by mouth daily. 08/22/19  Yes Angiulli, Lavon Paganini, PA-C  gabapentin (NEURONTIN) 100 MG capsule Take 100 mg by mouth at bedtime. 09/19/19  Yes [provider]  miconazole nitrate (MICATIN) POWD Apply 1 application topically 2 (two) times daily. 08/22/19  Yes Angiulli, Lavon Paganini, PA-C  midodrine (PROAMATINE) 10 MG tablet Take 1 tablet (10 mg total) by mouth 3 (three) times a week. Patient takes on dialysis days, M,W,F 08/23/19  Yes Angiulli, Lavon Paganini, PA-C  multivitamin (RENA-VIT) TABS tablet Take 1 tablet by mouth at bedtime. 08/22/19  Yes Angiulli, Lavon Paganini, PA-C  pantoprazole (PROTONIX) 40 MG tablet Take 1 tablet (40 mg total) by mouth 2 (two) times daily. 08/22/19  Yes Angiulli, Lavon Paganini, PA-C  rosuvastatin (CRESTOR) 20 MG tablet Take 1 tablet (20 mg total) by mouth daily at 6 PM. 08/22/19  Yes Angiulli, Lavon Paganini, PA-C  saccharomyces boulardii (FLORASTOR) 250 MG capsule Take 1 capsule (250 mg total) by mouth 2 (two) times daily. 08/22/19  Yes Angiulli, Lavon Paganini, PA-C  sevelamer carbonate (RENVELA) 800 MG tablet Take 1,600 mg by mouth 3 (three) times daily. 09/13/19  Yes [provider]  vitamin B-12 (CYANOCOBALAMIN) 500 MCG tablet Take 1 tablet (500 mcg total) by mouth daily. 08/22/19  Yes Angiulli, Lavon Paganini, PA-C  ALPRAZolam Duanne Moron) 0.25 MG tablet Take 1 tablet (0.25 mg total) by mouth at bedtime as needed for anxiety. Patient not taking: Reported on 10/02/2019 08/22/19   Cathlyn Parsons, PA-C    Physical Exam: Vitals:   10/02/19 1145 10/02/19 1230 10/02/19 1245 10/02/19 1415  BP: 101/65 101/61 99/64 101/63  Pulse:  (!) 123 (!) 123 (!) 125 [name])  121  Resp: (!) 22 (!) 22 (!) 23 (!) 24  Temp:      TempSrc:      SpO2: 97% 97% 96% 97%  Weight:      Height:         . General:  Appears calm and comfortable and is NAD; difficult answering questions, soft spoken . Eyes:   EOMI, normal lids, iris . ENT:  grossly normal hearing, lips & tongue, mmm . Neck:  no LAD, masses or thyromegaly . Cardiovascular:  RR with tachycardia, no m/r/g. No LE edema.  . Respiratory:   CTA bilaterally with no wheezes/rales/rhonchi.  Mildly increased respiratory effort. . Abdomen:  soft, NT, ND, NABS . Skin:  no rash or induration seen on limited exam . Musculoskeletal:  R hemiparesis.  3-4/5 strength LUE and LLE . Psychiatric:  Blunted mood and affect, speech slowed and appropriate to some questions, AOx1-2 . Neurologic:  Unable to affectively perform    Radiological Exams on Admission: CT Head Wo Contrast  Result Date: 10/02/2019 CLINICAL DATA:  Ataxia and lethargy EXAM: CT HEAD WITHOUT CONTRAST TECHNIQUE: Contiguous axial images were obtained from the base of the skull through the vertex without intravenous contrast. COMPARISON:  July 23, 2019 FINDINGS: Brain: There is mild diffuse atrophy, stable. There is no intracranial mass, hemorrhage, extra-axial fluid collection, or midline shift. There is evidence of infarct in the superior left frontal lobe extending to the left frontoparietal junction, not present on most recent CT and felt to be recent. There is patchy small vessel disease in the centra semiovale, stable. Prior small lacunar infarct in the superior left lentiform nucleus is stable. Prior small infarct in the left thalamus noted. Prior infarct noted in the right insular cortex region as well as in the anterior limb of the right internal capsule. There is also a prior small infarct in the mid right lentiform nucleus. Vascular: There is no appreciable hyperdense vessel. There is arterial vascular calcification in the  distal vertebral arteries and carotid siphon regions bilaterally. Skull: The bony calvarium appears intact. Sinuses/Orbits: There is mucosal thickening in the posterior right maxillary antrum. There is mucosal thickening in several ethmoid air cells bilaterally. Orbits appear symmetric bilaterally. Other: There is opacification of multiple mastoid air cells bilaterally. IMPRESSION: 1. Recent and potentially acute infarct in the superior left frontal lobe with involvement of the left frontal-parietal junction near the vertex as well. 2. Underlying atrophy with periventricular small vessel disease. Prior infarcts in the basal ganglia regions, left thalamus, and right insular cortex regions noted. No evident mass or hemorrhage. 3.  Foci of arterial vascular calcification noted. 4. Areas of paranasal sinus disease and bilateral mastoid air cell disease noted. Electronically Signed   By: William  Woodruff III M.D.   On: 10/02/2019 08:54   MR BRAIN WO CONTRAST  Result Date: 10/02/2019 CLINICAL DATA:  Ataxia, stroke suspected. Additional history provided: Decreased level of consciousness, history of CVA. EXAM: MRI HEAD WITHOUT CONTRAST TECHNIQUE: Multiplanar, multiecho pulse sequences of the brain and surrounding structures were obtained without intravenous contrast. COMPARISON:  Noncontrast head CT 10/02/2019, brain MRI 07/23/2019. FINDINGS: Brain: Intermittently motion degraded examination. Most notably there is moderate motion degradation of the sagittal T1 weighted sequence. Large late subacute left anterior cerebral artery vascular territory cortically based infarct predominantly involving the paramedian left frontal lobe with only minimal involvement of the adjacent left parietal lobe. This infarct is more extensive as compared to prior examination 07/23/2019. Corresponding chronic petechial hemorrhage now present at   this site. There are several small foci of diffusion weighted hyperintensity which are too small  to accurately characterize on the ADC map but likely reflect acute/early subacute infarcts as follows. Punctate infarct within the posterior body of the corpus callosum (series 5, image 79). Punctate infarct within the right corona radiata (series 5, image 77). Punctate infarct within the right frontal lobe white matter adjacent to the right frontal horn (series 5, image 75). Punctate infarct within the left occipital lobe periatrial white matter (series 5, image 70). Punctate infarct within the left cerebellum (series 5, image 56). There is no evidence of intracranial mass. No midline shift or extra-axial fluid collection. Moderate/advanced ill-defined and scattered T2/FLAIR hyperintensity within the cerebral white matter is nonspecific, but consistent with chronic small vessel ischemic disease. Redemonstrated are multiple chronic lacunar infarcts within the cerebral white matter, right thalamus and bilateral basal ganglia. Fairly extensive chronic ischemic changes are also again noted within the pons. Moderate generalized parenchymal atrophy Vascular: Flow voids maintained within the proximal large arterial vessels. Skull and upper cervical spine: Within the limitations of motion degradation, no focal marrow lesion is identified. Sinuses/Orbits: Visualized orbits demonstrate no acute abnormality. Minimal ethmoid sinus mucosal thickening bilateral mastoid effusions. IMPRESSION: 1. Large late subacute left ACA territory infarct which has increased in extent as compared to MRI 07/23/2019, although without evidence of superimposed acute infarct on the current exam. Chronic petechial hemorrhage now present at this site. 2. Multiple punctate acute/early subacute infarcts within the bilateral cerebral hemispheres, corpus callosum and left cerebellum as described. Given involvement of multiple vascular territories, correlate for an embolic phenomenon. 3. Stable background moderate/advanced chronic small vessel ischemic  disease with multiple chronic lacunar infarcts. 4. Moderate generalized parenchymal atrophy. Electronically Signed   By: Kyle  Golden DO   On: 10/02/2019 11:14   DG Chest Port 1 View  Result Date: 10/02/2019 CLINICAL DATA:  Altered level of consciousness. Coronary artery disease. Diastolic congestive heart failure. Chronic kidney disease. EXAM: PORTABLE CHEST 1 VIEW COMPARISON:  08/04/2019 FINDINGS: Stable mild cardiomegaly. Right jugular dual-lumen central venous catheter remains in appropriate position. Prior CABG again noted. No evidence of pulmonary infiltrate or edema. No evidence of pleural effusion. IMPRESSION: Stable mild cardiomegaly. No acute findings. Electronically Signed   By: John A Stahl M.D.   On: 10/02/2019 08:15    EKG: Independently reviewed.  Sinus tachycardia with rate 118; LBBB with NSCSLT  Labs on Admission: I have personally reviewed the available labs and imaging studies at the time of the admission.  Pertinent labs:   Glucose 109 BUN 49/Creatinine 5.03/GFR 8 Anion gap 17 Albumin 2.0 WBC 11.9 Hgb 9.4 COVID pending   Assessment/Plan Principal Problem:   CVA (cerebral vascular accident) (HCC) Active Problems:   Dyslipidemia   Essential hypertension   DIASTOLIC HEART FAILURE, CHRONIC   ESRD (end stage renal disease) on dialysis (HCC)   Controlled type 2 diabetes mellitus with hyperglycemia, without long-term current use of insulin (HCC)   Goals of care, counseling/discussion   DNR (do not resuscitate)   CVA -Patient with R-sided hemiplegia from prior CVA presenting with lethargy -She was found to have expected extension of her prior large CVA as well as new embolic CVA on MRI -Will admit for repeat CVA evaluation - will defer to neurology regarding the extent of appropriate repeat studies at this time -Telemetry monitoring -She does have 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 - ?need for vascular surgery consultation at this time (will  depend on   how aggressive patient/family desire to be) -Continue ASA daily for now -Neurology consult -PT/OT/ST/Nutrition Consults  HTN -Allow permissive HTN for now -Treat BP only if >220/120, and then with goal of 15% reduction -She was actually on Midodrine at home - will hold this medication for now   HLD -Continue Crestor   DM -Recent A1c shows reasonably good control -She is not taking medications at this time -Will cover with very sensitive scale SSI  Chronic diastolic CHF -Appears compensated at this time  ESRD on HD -Patient on chronic MWF HD -Nephrology prn order set utilized -She does not appear to be volume overloaded or otherwise in need of acute HD -Nephrology has been notified that patient will need HD  DNR/Goals of care -Patient previously met with palliative care to establish a MOST form -She is clearly DNR but the other goals include ongoing limited treatment including IVF and antibiotics if needed -Given recurrent CVA with prior extensive deficits, repeat palliative care evaluation and discussion appears to be warranted. -I did discuss aggressive CVA evaluation and management vs. Transition to comfort care with the husband and he clearly favored more aggressive care at this time.    Note: This patient has been tested and is pending for the novel coronavirus COVID-19.      DVT prophylaxis:  Heparin Code Status: DNR - confirmed with paperwork/family Family Communication: None present; I spoke with her husband by telephone at the time of her admission Disposition Plan:  To be determined Consults called: Neurology, Palliative Care, Nephrology; PT/OT/ST/Nutrition  Admission status: Admit - It is my clinical opinion that admission to INPATIENT is reasonable and necessary because of the expectation that this patient will require hospital care that crosses at least 2 midnights to treat this condition based on the medical complexity of the problems presented.   Given the aforementioned information, the predictability of an adverse outcome is felt to be significant.       MD [facility]   How to contact the [facility] Attending or Consulting provider 7A - 7P or covering provider during after hours 7P -7A, for this patient?  1. Check the care team in [facility] and look for a) attending/consulting [facility] provider listed and b) the [facility] team listed 2. Log into [email or web address] and use 's universal password to access. If you do not have the password, please contact the hospital operator. 3. Locate the [facility] provider you are looking for under [facility] and page to a number that you can be directly reached. 4. If you still have difficulty reaching the provider, please page the DOC (Director on Call) for the Hospitalists listed on amion for assistance.   10/02/2019, 2:42 PM   

## 2019-10-02 NOTE — ED Notes (Addendum)
Patients temp was 99.3

## 2019-10-02 NOTE — ED Triage Notes (Signed)
Pt arrives via EMS from home where her husband states he gave her Xanax after dinner last night. Husband noticed the pt become more lethargic but didn't call EMS until this morning when she was unresponsive to the husband. EMS administered 0.4 of Narcan and the pt became more alert but is still only responsive to voice. Pt is a dialysis pt MWF. Pt was hypotensive for EMS, last BP 83/51. Pt arrives alert to self and location.

## 2019-10-02 NOTE — ED Notes (Signed)
Attempted to give report and was told the floor is short 2 nurses and they can't accept any pts right now.

## 2019-10-02 NOTE — ED Notes (Signed)
Notified Dr. Baltazar Najjar of pt's bp and 3-4 run v-tach, she stated she will put orders in shortly.

## 2019-10-02 NOTE — Consult Note (Signed)
[facility] Renal Consultation Note    Indication for Consultation:  Management of ESRD/hemodialysis, anemia, hypertension/volume, and secondary hyperparathyroidism. PCP:  HPI: [name] is a 79 y.o. 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 new CVA.   Pt seen in ED bed - unable to give any Hx. Wakes, but not speaking clearly. Can follow simple directions. Per notes, husband found her unresponsive this morning. Had given her Xanax last night. EMS called - found to be hypotensive, given narcan with minimal improvement. In ED, intake labs shwoed Na 136, K 4.2, Glu 109, Ca 8.6, WBC 11.9, Hgb 9.4. Head CT showed new R frontal-parietal stroke. Confirmatory MRI showed: " 1- Large late subacute left ACA territory infarct which has increased in extent as compared to MRI 07/23/2019, although without evidence of superimposed acute infarct on the current exam. Chronic petechial hemorrhage now present at this site. 2- Multiple punctate acute/early subacute infarcts within the bilateral cerebral hemispheres, corpus callosum and left cerebellum as described. Given involvement of multiple vascular territories, correlate for an embolic phenomenon, 3- Stable background moderate/advanced chronic small vessel ischemic disease with multiple chronic lacunar infarcts."  [facility] - last HD was Monday which was completed in entirety via [facility].  Past Medical History:  Diagnosis Date  . Anemia    Pt is taking iron.   [name] Anxiety   . Arthritis   . Carotid stenosis    40-59% bilateral ICA stenosis in 2/12.  . Chronic low back pain   . CKD (chronic kidney disease)    Dr. Audie Clear at [facility]  . Coronary artery disease    Pt presented 2/10 to [facility] with NSTEMI and diastolic CHF exacerbation.  LHC was done  3/10 showing 99% pRCA stenosis and 80% calcified pLAD  stenosis with L=>R collaterals.  Pt was referred  for CABG which was done by Dr. Prescott Gum with LIMA-LAD, SVG-RCA, SVG-OM.  . Diabetes mellitus   . Diabetic neuropathy ([place])   . Diastolic CHF (HCC)    Echo (2/10) showed EF 55-65%, mild LVH, diastolic dysfunction, mild AS with mean gradient 12 mmHg, PASP 43 mmHg.  Echo (2/12): EF 55-60%, mild LVH, mild AS (mean gradient 12), PA systolic pressure 32 mmHg.     [name] GERD (gastroesophageal reflux disease)   . Heart murmur   . Hyperlipidemia   . Hypertension   . Mild aortic stenosis    mean gradient 12 mmHg in 2/12.  . Myocardial infarction ([place])    "mild"  . Pneumonia   . PONV (postoperative nausea and vomiting)   . Stroke [facility])    " mild"  . Thrombocytopenia ([place])   . Unsteady gait    Past Surgical History:  Procedure Laterality Date  . AORTIC ARCH ANGIOGRAPHY N/A 10/18/2018   Procedure: AORTIC ARCH ANGIOGRAPHY;  Surgeon: Marty Heck, MD;  Location: [place] CV LAB;  Service: Cardiovascular;  Laterality: N/A;  . AV FISTULA PLACEMENT Left 01/30/2015   Procedure: Creation of a Radial Cephalic AV Fistula left wrist;  Surgeon: [name], MD;  Location: [place];  Service: Vascular;  Laterality: Left;  . AV FISTULA PLACEMENT Left 09/06/2018   Procedure: LEFT ARM ARTERIOVENOUS (AV) FISTULA CREATION;  Surgeon: Marty Heck, MD;  Location: [place];  Service: Vascular;  Laterality: Left;  . BACK SURGERY     multiple  . BREAST SURGERY  biopsy  . BUBBLE STUDY  08/02/2019   Procedure: BUBBLE STUDY;  Surgeon: Skeet Latch, MD;  Location: [place];  Service: Cardiovascular;;  . CARDIAC CATHETERIZATION    . CATARACT EXTRACTION W/ INTRAOCULAR LENS  IMPLANT, BILATERAL    . COLONOSCOPY Left 11/03/2016   Procedure: COLONOSCOPY;  Surgeon: [name], MD;  Location: [facility] ENDOSCOPY;  Service: Endoscopy;  Laterality: Left;  . COLONOSCOPY W/ BIOPSIES AND POLYPECTOMY    . CORONARY ARTERY BYPASS GRAFT  09/2008   pt with NSTEMI and  diastolic CHF exacerbation.  LHC was done  3/10 showing 99% pRCA stenosis and 80% calcified pLAD stenosis with L=>R collaterals.  Pt was referred  for CABG which was done by Dr. Prescott Gum with LIMA-LAD, SVG-RCA, SVG-OM.  [name] ESOPHAGOGASTRODUODENOSCOPY N/A 11/02/2016   Procedure: ESOPHAGOGASTRODUODENOSCOPY (EGD);  Surgeon: [name], MD;  Location: [facility] ENDOSCOPY;  Service: Endoscopy;  Laterality: N/A;  . GIVENS CAPSULE STUDY N/A 11/29/2016   Procedure: GIVENS CAPSULE STUDY;  Surgeon: [name], MD;  Location: [place];  Service: Endoscopy;  Laterality: N/A;  . LIGATION OF ARTERIOVENOUS  FISTULA Left 09/13/2018   Procedure: LIGATION OF LEFT ARTERIOVENOUS  FISTULA;  Surgeon: Waynetta Sandy, MD;  Location: [place];  Service: Vascular;  Laterality: Left;  . REVISON OF ARTERIOVENOUS FISTULA Left 07/02/2015   Procedure: REVISON OF LEFT RADIOCEPHALIC ARTERIOVENOUS FISTULA;  Surgeon: [name], MD;  Location: [place];  Service: Vascular;  Laterality: Left;  . RIGHT/LEFT HEART CATH AND CORONARY/GRAFT ANGIOGRAPHY N/A 05/18/2018   Procedure: RIGHT/LEFT HEART CATH AND CORONARY/GRAFT ANGIOGRAPHY;  Surgeon: Leonie Man, MD;  Location: [place] CV LAB;  Service: Cardiovascular;  Laterality: N/A;  . TEE WITHOUT CARDIOVERSION N/A 08/02/2019   Procedure: TRANSESOPHAGEAL ECHOCARDIOGRAM (TEE);  Surgeon: Skeet Latch, MD;  Location: [place];  Service: Cardiovascular;  Laterality: N/A;  . UPPER EXTREMITY ANGIOGRAPHY Left 10/18/2018   Procedure: UPPER EXTREMITY ANGIOGRAPHY;  Surgeon: Marty Heck, MD;  Location: [place] CV LAB;  Service: Cardiovascular;  Laterality: Left;   Family History  Adopted: Yes  Family history unknown: Yes   Social History:  reports that she has quit smoking. Her smoking use included cigarettes. She has never used smokeless tobacco. She reports current alcohol use. She reports that she does not use drugs.  ROS: Unable to obtain, patient  lerthagic.  Physical Exam: Vitals:   10/02/19 1145 10/02/19 1230 10/02/19 1245 10/02/19 1415  BP: 101/65 101/61 99/64 101/63  Pulse: (!) 123 (!) 123 (!) 125 (!) 121  Resp: (!) 22 (!) 22 (!) 23 (!) 24  Temp:      TempSrc:      SpO2: 97% 97% 96% 97%  Weight:      Height:         General: Ill appearing, frail woman, NAD. Mildly clammy, diaphoretic. Head: Normocephalic, atraumatic Neck: Supple without lymphadenopathy/masses Lungs: Clear bilaterally to auscultation without wheezes, rales, or rhonchi.  Heart: RRR; 2/6 murmur Abdomen: Soft, non-tender, non-distended with normoactive bowel sounds.  Musculoskeletal:  Weak grip to L hand, chronic R hemiplegia Lower extremities: Scattered bruising, no edema Neuro: Alert and oriented X 3. Moves all extremities spontaneously. Dialysis Access: TDC in R chest  Allergies  Allergen Reactions  . Doxycycline Nausea And Vomiting    Caused "DEATHLY NAUSEA AND VOMITING"  . Lipitor [Atorvastatin] Other (See Comments)    Stomach pain  . Strawberry Extract Rash   Prior to Admission medications   Medication Sig Start Date End Date Taking? Authorizing Provider  acetaminophen (TYLENOL) 325 MG tablet Take 2 tablets (650 mg total) by mouth every 4 (four) hours as needed for mild pain (or temp > 37.5 C (99.5 F)). 08/22/19  Yes Angiulli, Lavon Paganini, PA-C  ALPRAZolam Duanne Moron) 1 MG tablet Take 1 mg by mouth at bedtime. 09/16/19  Yes [provider]  aspirin EC 81 MG EC tablet Take 1 tablet (81 mg total) by mouth daily. 07/27/19  Yes Florencia Reasons, MD  folic acid (FOLVITE) 1 MG tablet Take 1 tablet (1 mg total) by mouth daily. 08/22/19  Yes Angiulli, Lavon Paganini, PA-C  gabapentin (NEURONTIN) 100 MG capsule Take 100 mg by mouth at bedtime. 09/19/19  Yes [provider]  miconazole nitrate (MICATIN) POWD Apply 1 application topically 2 (two) times daily. 08/22/19  Yes Angiulli, Lavon Paganini, PA-C  midodrine (PROAMATINE) 10 MG tablet Take 1 tablet (10 mg total) by mouth  3 (three) times a week. Patient takes on dialysis days, M,W,F 08/23/19  Yes Angiulli, Lavon Paganini, PA-C  multivitamin (RENA-VIT) TABS tablet Take 1 tablet by mouth at bedtime. 08/22/19  Yes Angiulli, Lavon Paganini, PA-C  pantoprazole (PROTONIX) 40 MG tablet Take 1 tablet (40 mg total) by mouth 2 (two) times daily. 08/22/19  Yes Angiulli, Lavon Paganini, PA-C  rosuvastatin (CRESTOR) 20 MG tablet Take 1 tablet (20 mg total) by mouth daily at 6 PM. 08/22/19  Yes Angiulli, Lavon Paganini, PA-C  saccharomyces boulardii (FLORASTOR) 250 MG capsule Take 1 capsule (250 mg total) by mouth 2 (two) times daily. 08/22/19  Yes Angiulli, Lavon Paganini, PA-C  sevelamer carbonate (RENVELA) 800 MG tablet Take 1,600 mg by mouth 3 (three) times daily. 09/13/19  Yes [provider]  vitamin B-12 (CYANOCOBALAMIN) 500 MCG tablet Take 1 tablet (500 mcg total) by mouth daily. 08/22/19  Yes Angiulli, Lavon Paganini, PA-C   Current Facility-Administered Medications  Medication Dose Route Frequency Provider Last Rate Last Admin  .  stroke: mapping our early stages of recovery book   Does not apply Once Karmen Bongo, MD      . 0.9 %  sodium chloride infusion   Intravenous Continuous Karmen Bongo, MD 50 mL/hr at 10/02/19 1443 New Bag at 10/02/19 1443  . acetaminophen (TYLENOL) tablet 650 mg  650 mg Oral Q4H PRN Karmen Bongo, MD       Or  . acetaminophen (TYLENOL) 160 MG/5ML solution 650 mg  650 mg Per Tube Q4H PRN Karmen Bongo, MD       Or  . acetaminophen (TYLENOL) suppository 650 mg  650 mg Rectal Q4H PRN Karmen Bongo, MD      . ALPRAZolam Duanne Moron) tablet 1 mg  1 mg Oral Ivery Quale, MD      . Derrill Memo ON 10/03/2019] aspirin EC tablet 81 mg  81 mg Oral Daily Karmen Bongo, MD      . calcium carbonate (dosed in mg elemental calcium) suspension 500 mg of elemental calcium  500 mg of elemental calcium Oral Q6H PRN Karmen Bongo, MD      . camphor-menthol [facility]) lotion 1 application  1 application Topical [id number] PRN Karmen Bongo, MD        And  . hydrOXYzine (ATARAX/VISTARIL) tablet 25 mg  25 mg Oral Q8H PRN Karmen Bongo, MD      . docusate sodium [facility]) enema 283 mg  1 enema Rectal PRN Karmen Bongo, MD      . feeding supplement (NEPRO CARB STEADY) liquid 237 mL  237 mL Oral TID PRN Karmen Bongo, MD      .  gabapentin (NEURONTIN) capsule 100 mg  100 mg Oral QHS Karmen Bongo, MD      . heparin injection 5,000 Units  5,000 Units Subcutaneous Camelia Phenes Karmen Bongo, MD   5,000 Units at 10/02/19 1443  . insulin aspart (novoLOG) injection 0-6 Units  0-6 Units Subcutaneous TID WC Karmen Bongo, MD      . multivitamin (RENA-VIT) tablet 1 tablet  1 tablet Oral QHS Karmen Bongo, MD      . ondansetron [facility]) tablet 4 mg  4 mg Oral Q6H PRN Karmen Bongo, MD       Or  . ondansetron [facility]) injection 4 mg  4 mg Intravenous Q6H PRN Karmen Bongo, MD      . pantoprazole (PROTONIX) EC tablet 40 mg  40 mg Oral BID Karmen Bongo, MD      . rosuvastatin (CRESTOR) tablet 20 mg  20 mg Oral q1800 Karmen Bongo, MD      . senna-docusate (Senokot-S) tablet 1 tablet  1 tablet Oral QHS PRN Karmen Bongo, MD      . sevelamer carbonate (RENVELA) tablet 1,600 mg  1,600 mg Oral TID Karmen Bongo, MD      . sorbitol 70 % solution 30 mL  30 mL Oral PRN Karmen Bongo, MD      . zolpidem Lorrin Mais) tablet 5 mg  5 mg Oral QHS PRN Karmen Bongo, MD       Current Outpatient Medications  Medication Sig Dispense Refill  . acetaminophen (TYLENOL) 325 MG tablet Take 2 tablets (650 mg total) by mouth every 4 (four) hours as needed for mild pain (or temp > 37.5 C (99.5 F)).    [name] ALPRAZolam (XANAX) 1 MG tablet Take 1 mg by mouth at bedtime.    [name] aspirin EC 81 MG EC tablet Take 1 tablet (81 mg total) by mouth daily. 30 tablet 3  . folic acid (FOLVITE) 1 MG tablet Take 1 tablet (1 mg total) by mouth daily. 30 tablet 0  . gabapentin (NEURONTIN) 100 MG capsule Take 100 mg by mouth at bedtime.    . miconazole nitrate (MICATIN) POWD Apply 1  application topically 2 (two) times daily. 1 g 0  . midodrine (PROAMATINE) 10 MG tablet Take 1 tablet (10 mg total) by mouth 3 (three) times a week. Patient takes on dialysis days, M,W,F 30 tablet 0  . multivitamin (RENA-VIT) TABS tablet Take 1 tablet by mouth at bedtime. 30 tablet 0  . pantoprazole (PROTONIX) 40 MG tablet Take 1 tablet (40 mg total) by mouth 2 (two) times daily. 60 tablet 0  . rosuvastatin (CRESTOR) 20 MG tablet Take 1 tablet (20 mg total) by mouth daily at 6 PM. 30 tablet 0  . saccharomyces boulardii (FLORASTOR) 250 MG capsule Take 1 capsule (250 mg total) by mouth 2 (two) times daily. 60 capsule 0  . sevelamer carbonate (RENVELA) 800 MG tablet Take 1,600 mg by mouth 3 (three) times daily.    . vitamin B-12 (CYANOCOBALAMIN) 500 MCG tablet Take 1 tablet (500 mcg total) by mouth daily. 30 tablet 0   Labs: Basic Metabolic Panel: Recent Labs  Lab 10/02/19 0734  NA 136  K 4.2  CL 95*  CO2 24  GLUCOSE 109*  BUN 49*  CREATININE 5.03*  CALCIUM 8.6*   Liver Function Tests: Recent Labs  Lab 10/02/19 0734  AST 21  ALT 14  ALKPHOS 82  BILITOT 0.7  PROT 6.0*  ALBUMIN 2.0*   CBC: Recent Labs  Lab 10/02/19 0734  WBC  11.9*  NEUTROABS 8.4*  HGB 9.4*  HCT 31.9*  MCV 103.2*  PLT 161   Studies/Results: CT Head Wo Contrast  Result Date: 10/02/2019 CLINICAL DATA:  Ataxia and lethargy EXAM: CT HEAD WITHOUT CONTRAST TECHNIQUE: Contiguous axial images were obtained from the base of the skull through the vertex without intravenous contrast. COMPARISON:  July 23, 2019 FINDINGS: Brain: There is mild diffuse atrophy, stable. There is no intracranial mass, hemorrhage, extra-axial fluid collection, or midline shift. There is evidence of infarct in the superior left frontal lobe extending to the left frontoparietal junction, not present on most recent CT and felt to be recent. There is patchy small vessel disease in the centra semiovale, stable. Prior small lacunar infarct in  the superior left lentiform nucleus is stable. Prior small infarct in the left thalamus noted. Prior infarct noted in the right insular cortex region as well as in the anterior limb of the right internal capsule. There is also a prior small infarct in the mid right lentiform nucleus. Vascular: There is no appreciable hyperdense vessel. There is arterial vascular calcification in the distal vertebral arteries and carotid siphon regions bilaterally. Skull: The bony calvarium appears intact. Sinuses/Orbits: There is mucosal thickening in the posterior right maxillary antrum. There is mucosal thickening in several ethmoid air cells bilaterally. Orbits appear symmetric bilaterally. Other: There is opacification of multiple mastoid air cells bilaterally. IMPRESSION: 1. Recent and potentially acute infarct in the superior left frontal lobe with involvement of the left frontal-parietal junction near the vertex as well. 2. Underlying atrophy with periventricular small vessel disease. Prior infarcts in the basal ganglia regions, left thalamus, and right insular cortex regions noted. No evident mass or hemorrhage. 3.  Foci of arterial vascular calcification noted. 4. Areas of paranasal sinus disease and bilateral mastoid air cell disease noted. Electronically Signed   By: Lowella Grip III M.D.   On: 10/02/2019 08:54   MR BRAIN WO CONTRAST  Result Date: 10/02/2019 CLINICAL DATA:  Ataxia, stroke suspected. Additional history provided: Decreased level of consciousness, history of CVA. EXAM: MRI HEAD WITHOUT CONTRAST TECHNIQUE: Multiplanar, multiecho pulse sequences of the brain and surrounding structures were obtained without intravenous contrast. COMPARISON:  Noncontrast head CT 10/02/2019, brain MRI 07/23/2019. FINDINGS: Brain: Intermittently motion degraded examination. Most notably there is moderate motion degradation of the sagittal T1 weighted sequence. Large late subacute left anterior cerebral artery vascular  territory cortically based infarct predominantly involving the paramedian left frontal lobe with only minimal involvement of the adjacent left parietal lobe. This infarct is more extensive as compared to prior examination 07/23/2019. Corresponding chronic petechial hemorrhage now present at this site. There are several small foci of diffusion weighted hyperintensity which are too small to accurately characterize on the ADC map but likely reflect acute/early subacute infarcts as follows. Punctate infarct within the posterior body of the corpus callosum (series 5, image 79). Punctate infarct within the right corona radiata (series 5, image 77). Punctate infarct within the right frontal lobe white matter adjacent to the right frontal horn (series 5, image 75). Punctate infarct within the left occipital lobe periatrial white matter (series 5, image 70). Punctate infarct within the left cerebellum (series 5, image 56). There is no evidence of intracranial mass. No midline shift or extra-axial fluid collection. Moderate/advanced ill-defined and scattered T2/FLAIR hyperintensity within the cerebral white matter is nonspecific, but consistent with chronic small vessel ischemic disease. Redemonstrated are multiple chronic lacunar infarcts within the cerebral white matter, right thalamus and bilateral basal ganglia.  Fairly extensive chronic ischemic changes are also again noted within the pons. Moderate generalized parenchymal atrophy Vascular: Flow voids maintained within the proximal large arterial vessels. Skull and upper cervical spine: Within the limitations of motion degradation, no focal marrow lesion is identified. Sinuses/Orbits: Visualized orbits demonstrate no acute abnormality. Minimal ethmoid sinus mucosal thickening bilateral mastoid effusions. IMPRESSION: 1. Large late subacute left ACA territory infarct which has increased in extent as compared to MRI 07/23/2019, although without evidence of superimposed acute  infarct on the current exam. Chronic petechial hemorrhage now present at this site. 2. Multiple punctate acute/early subacute infarcts within the bilateral cerebral hemispheres, corpus callosum and left cerebellum as described. Given involvement of multiple vascular territories, correlate for an embolic phenomenon. 3. Stable background moderate/advanced chronic small vessel ischemic disease with multiple chronic lacunar infarcts. 4. Moderate generalized parenchymal atrophy. Electronically Signed   By: Kellie Simmering DO   On: 10/02/2019 11:14   DG Chest Port 1 View  Result Date: 10/02/2019 CLINICAL DATA:  Altered level of consciousness. Coronary artery disease. Diastolic congestive heart failure. Chronic kidney disease. EXAM: PORTABLE CHEST 1 VIEW COMPARISON:  08/04/2019 FINDINGS: Stable mild cardiomegaly. Right jugular dual-lumen central venous catheter remains in appropriate position. Prior CABG again noted. No evidence of pulmonary infiltrate or edema. No evidence of pleural effusion. IMPRESSION: Stable mild cardiomegaly. No acute findings. Electronically Signed   By: Marlaine Hind M.D.   On: 10/02/2019 08:15   Dialysis Orders:  MWF at [facility] 4hr, 300/600, EDW 51.5kg, 2K/2Ca, UFP #4, TDC, heparin 1000 bolus - Hectoral 61mcg IV q HD - Mircera 150 q 2weeks (last 2/10)  Assessment/Plan: 1.  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 Per neuro/hospitalist. 2.  ESRD: Usual MWF schedule - missed her HD today. Will push HD to tomorrow to see if will look a little more stable. 3.  Hypertension/volume:BP low - no edema. S/p fluid bolus. 4.  Anemia: Hgb 9.4 5.  Metabolic bone disease: Ca/Phos 6.  DNR  Veneta Penton, PA-C 10/02/2019, 3:20 PM  [facility] Pager: [phone]

## 2019-10-02 NOTE — Progress Notes (Addendum)
MEWS score red d/t elevated HR and low BP. Not acute change. Baltazar Najjar updated, interventions in place.

## 2019-10-02 NOTE — ED Notes (Signed)
Notified Dr. Lorin Mercy of pt's critical troponin level.

## 2019-10-02 NOTE — ED Provider Notes (Addendum)
[facility] EMERGENCY DEPARTMENT Provider Note   CSN: [id number] Arrival date & time: 10/02/19  [id number]     History Chief Complaint  Patient presents with  . Altered Mental Status    [name] is a 79 y.o. female.  Patient is a 79 year old female with extensive past medical history including end-stage renal disease on hemodialysis, diabetes, coronary artery disease, CHF, hypertension.  She is brought from home by EMS for evaluation of altered mental status.  According to EMS, the patient was given a Xanax last night by her husband to help her breast.  This morning he was having difficulty waking her.  She was given Narcan by EMS with some improvement.  Patient denies to me she is experiencing any discomfort.  She was last dialyzed Monday and is due again today.  The history is provided by the patient.  Altered Mental Status Presenting symptoms: partial responsiveness   Severity:  Moderate Most recent episode:  Today Timing:  Constant Progression:  Unchanged Chronicity:  New Associated symptoms: no fever, no headaches and no slurred speech        Past Medical History:  Diagnosis Date  . Anemia    Pt is taking iron.   [name] Anxiety   . Arthritis   . Carotid stenosis    40-59% bilateral ICA stenosis in 2/12.  . Chronic low back pain   . CKD (chronic kidney disease)    Dr. Audie Clear at [facility]  . Coronary artery disease    Pt presented 2/10 to [facility] with NSTEMI and diastolic CHF exacerbation.  LHC was done  3/10 showing 99% pRCA stenosis and 80% calcified pLAD stenosis with L=>R collaterals.  Pt was referred  for CABG which was done by Dr. Prescott Gum with LIMA-LAD, SVG-RCA, SVG-OM.  . Diabetes mellitus   . Diabetic neuropathy ([place])   . Diastolic CHF (HCC)    Echo (2/10) showed EF 55-65%, mild LVH, diastolic dysfunction, mild AS with mean gradient 12 mmHg, PASP 43 mmHg.  Echo (2/12): EF 55-60%, mild LVH, mild AS (mean gradient 12), PA systolic  pressure 32 mmHg.     [name] GERD (gastroesophageal reflux disease)   . Heart murmur   . Hyperlipidemia   . Hypertension   . Mild aortic stenosis    mean gradient 12 mmHg in 2/12.  . Myocardial infarction ([place])    "mild"  . Pneumonia   . PONV (postoperative nausea and vomiting)   . Stroke [facility]    " mild"  . Thrombocytopenia ([place])   . Unsteady gait     Patient Active Problem List   Diagnosis Date Noted  . Palliative care by specialist   . Goals of care, counseling/discussion   . DNR (do not resuscitate)   . Lethargy   . ESRD on dialysis ([place])   . Controlled type 2 diabetes mellitus with hyperglycemia, without long-term current use of insulin ([place])   . Labile blood glucose   . Chronic cystitis   . Diarrhea due to malabsorption   . Small vessel cerebrovascular accident (CVA) ([place]) 07/27/2019  . Stage I pressure ulcer of sacral region 07/25/2019  . TIA (transient ischemic attack) 07/23/2019  . CVA (cerebral vascular accident) ([place]) 07/23/2019  . Volume overload 05/27/2018  . Dyspnea 05/27/2018  . Acute respiratory failure with hypoxia ([place]) 05/27/2018  . Acute respiratory distress 05/27/2018  . Acute encephalopathy 05/27/2018  . Aspiration pneumonia ([place]) 05/27/2018  . Pressure ulcer 05/19/2018  . Acute on chronic  combined systolic and diastolic heart failure ([place]) 05/17/2018  . LBBB (left bundle branch block) 05/17/2018  . Aortic stenosis, moderate 05/17/2018  . NSTEMI (non-ST elevated myocardial infarction) ([place]) 05/16/2018  . Symptomatic anemia 05/15/2018  . Demand ischemia ([place]) 05/15/2018  . Protein-calorie malnutrition, severe 04/20/2018  . Pressure injury of skin 04/18/2018  . Lower GI bleeding 10/31/2016  . Blood loss anemia 10/31/2016  . AKI (acute kidney injury) ([place]) 10/31/2016  . Stroke ([place]) 09/26/2016  . ESRD (end stage renal disease) on dialysis ([place]) 06/23/2015  . Balance problem 12/22/2013  . Edema 12/28/2011  . CLAUDICATION 11/01/2010  . CAROTID ARTERY  STENOSIS 10/29/2009  . AORTIC STENOSIS 09/17/2009  . Essential hypertension 11/17/2008  . DIASTOLIC HEART FAILURE, CHRONIC 11/17/2008  . Diabetes mellitus type II, non insulin dependent ([place]) 11/12/2008  . Other and unspecified hyperlipidemia 11/12/2008  . Anemia, chronic renal failure 11/12/2008  . Thrombocytopenia ([place]) 11/12/2008  . Anxiety state 11/12/2008  . Coronary atherosclerosis 11/12/2008  . UNSPEC COMBINED SYSTOLIC&DIASTOLIC HEART FAILURE 0000000  . ESOPHAGEAL REFLUX 11/12/2008  . Chronic kidney disease (CKD), stage V ([place]) 11/12/2008  . LOW BACK PAIN, CHRONIC 11/12/2008  . INSOMNIA UNSPECIFIED 11/12/2008    Past Surgical History:  Procedure Laterality Date  . AORTIC ARCH ANGIOGRAPHY N/A 10/18/2018   Procedure: AORTIC ARCH ANGIOGRAPHY;  Surgeon: Marty Heck, MD;  Location: [place] CV LAB;  Service: Cardiovascular;  Laterality: N/A;  . AV FISTULA PLACEMENT Left 01/30/2015   Procedure: Creation of a Radial Cephalic AV Fistula left wrist;  Surgeon: [name], MD;  Location: [place];  Service: Vascular;  Laterality: Left;  . AV FISTULA PLACEMENT Left 09/06/2018   Procedure: LEFT ARM ARTERIOVENOUS (AV) FISTULA CREATION;  Surgeon: Marty Heck, MD;  Location: [place];  Service: Vascular;  Laterality: Left;  . BACK SURGERY     multiple  . BREAST SURGERY     biopsy  . BUBBLE STUDY  08/02/2019   Procedure: BUBBLE STUDY;  Surgeon: Skeet Latch, MD;  Location: [place];  Service: Cardiovascular;;  . CARDIAC CATHETERIZATION    . CATARACT EXTRACTION W/ INTRAOCULAR LENS  IMPLANT, BILATERAL    . COLONOSCOPY Left 11/03/2016   Procedure: COLONOSCOPY;  Surgeon: [name], MD;  Location[facility] ENDOSCOPY;  Service: Endoscopy;  Laterality: Left;  . COLONOSCOPY W/ BIOPSIES AND POLYPECTOMY    . CORONARY ARTERY BYPASS GRAFT  09/2008   pt with NSTEMI and diastolic CHF exacerbation.  LHC was done  3/10 showing 99% pRCA stenosis and 80% calcified pLAD stenosis with  L=>R collaterals.  Pt was referred  for CABG which was done by Dr. Prescott Gum with LIMA-LAD, SVG-RCA, SVG-OM.  [name] ESOPHAGOGASTRODUODENOSCOPY N/A 11/02/2016   Procedure: ESOPHAGOGASTRODUODENOSCOPY (EGD);  Surgeon: [name], MD;  Location: [facility] ENDOSCOPY;  Service: Endoscopy;  Laterality: N/A;  . GIVENS CAPSULE STUDY N/A 11/29/2016   Procedure: GIVENS CAPSULE STUDY;  Surgeon: [name], MD;  Location: [place];  Service: Endoscopy;  Laterality: N/A;  . LIGATION OF ARTERIOVENOUS  FISTULA Left 09/13/2018   Procedure: LIGATION OF LEFT ARTERIOVENOUS  FISTULA;  Surgeon: Waynetta Sandy, MD;  Location: [place];  Service: Vascular;  Laterality: Left;  . REVISON OF ARTERIOVENOUS FISTULA Left 07/02/2015   Procedure: REVISON OF LEFT RADIOCEPHALIC ARTERIOVENOUS FISTULA;  Surgeon: [name], MD;  Location: [place];  Service: Vascular;  Laterality: Left;  . RIGHT/LEFT HEART CATH AND CORONARY/GRAFT ANGIOGRAPHY N/A 05/18/2018   Procedure: RIGHT/LEFT HEART CATH AND CORONARY/GRAFT ANGIOGRAPHY;  Surgeon: Glenetta Hew  W, MD;  Location: [place] CV [facility];  Service: Cardiovascular;  Laterality: N/A;  . TEE WITHOUT CARDIOVERSION N/A 08/02/2019   Procedure: TRANSESOPHAGEAL ECHOCARDIOGRAM (TEE);  Surgeon: Skeet Latch, MD;  Location: [place];  Service: Cardiovascular;  Laterality: N/A;  . UPPER EXTREMITY ANGIOGRAPHY Left 10/18/2018   Procedure: UPPER EXTREMITY ANGIOGRAPHY;  Surgeon: Marty Heck, MD;  Location: [place] CV [facility];  Service: Cardiovascular;  Laterality: Left;     OB History   No obstetric history on file.     Family History  Adopted: Yes  Family history unknown: Yes    Social History   Tobacco Use  . Smoking status: Former Smoker    Types: Cigarettes  . Smokeless tobacco: Never Used  . Tobacco comment: quit 1988  Substance Use Topics  . Alcohol use: Yes    Comment: daily  . Drug use: No    Home Medications Prior to Admission medications   Medication Sig  Start Date End Date Taking? Authorizing Provider  acetaminophen (TYLENOL) 325 MG tablet Take 2 tablets (650 mg total) by mouth every 4 (four) hours as needed for mild pain (or temp > 37.5 C (99.5 F)). 08/22/19   Angiulli, Lavon Paganini, PA-C  ALPRAZolam Duanne Moron) 0.25 MG tablet Take 1 tablet (0.25 mg total) by mouth at bedtime as needed for anxiety. 08/22/19   Angiulli, Lavon Paganini, PA-C  aspirin EC 81 MG EC tablet Take 1 tablet (81 mg total) by mouth daily. 07/27/19   Florencia Reasons, MD  folic acid (FOLVITE) 1 MG tablet Take 1 tablet (1 mg total) by mouth daily. 08/22/19   Angiulli, Lavon Paganini, PA-C  miconazole nitrate (MICATIN) POWD Apply 1 application topically 2 (two) times daily. 08/22/19   Angiulli, Lavon Paganini, PA-C  midodrine (PROAMATINE) 10 MG tablet Take 1 tablet (10 mg total) by mouth 3 (three) times a week. Patient takes on dialysis days, M,W,F 08/23/19   Angiulli, Lavon Paganini, PA-C  multivitamin (RENA-VIT) TABS tablet Take 1 tablet by mouth at bedtime. 08/22/19   Angiulli, Lavon Paganini, PA-C  pantoprazole (PROTONIX) 40 MG tablet Take 1 tablet (40 mg total) by mouth 2 (two) times daily. 08/22/19   Angiulli, Lavon Paganini, PA-C  rosuvastatin (CRESTOR) 20 MG tablet Take 1 tablet (20 mg total) by mouth daily at 6 PM. 08/22/19   Angiulli, Lavon Paganini, PA-C  saccharomyces boulardii (FLORASTOR) 250 MG capsule Take 1 capsule (250 mg total) by mouth 2 (two) times daily. 08/22/19   Angiulli, Lavon Paganini, PA-C  vitamin B-12 (CYANOCOBALAMIN) 500 MCG tablet Take 1 tablet (500 mcg total) by mouth daily. 08/22/19   Angiulli, Lavon Paganini, PA-C    Allergies    Doxycycline, Lipitor [atorvastatin], and Strawberry extract  Review of Systems   Review of Systems  Constitutional: Negative for fever.  Neurological: Negative for headaches.  All other systems reviewed and are negative.   Physical Exam Updated Vital Signs BP 115/68   Pulse (!) 123   Temp 99.5 F (37.5 C) (Oral)   Resp (!) 23   Ht 5' 2.99" (1.6 m)   Wt 51.6 kg   SpO2 98%   BMI 20.16 kg/m    Physical Exam Vitals and nursing note reviewed.  Constitutional:      General: She is not in acute distress.    Appearance: She is well-developed. She is not diaphoretic.     Comments: Patient is a 79 year old female who appears chronically ill.  She is somewhat somnolent, but arousable and appropriate.  HENT:  Head: Normocephalic and atraumatic.  Cardiovascular:     Rate and Rhythm: Normal rate and regular rhythm.     Heart sounds: No murmur. No friction rub. No gallop.   Pulmonary:     Effort: Pulmonary effort is normal. No respiratory distress.     Breath sounds: Normal breath sounds. No wheezing.  Abdominal:     General: Bowel sounds are normal. There is no distension.     Palpations: Abdomen is soft.     Tenderness: There is no abdominal tenderness.  Musculoskeletal:        General: Normal range of motion.     Cervical back: Normal range of motion and neck supple.  Skin:    General: Skin is warm and dry.  Neurological:     Mental Status: She is alert and oriented to person, place, and time.     Comments: Patient with baseline right hemiplegia.  She is arousable, but somewhat slow to respond, but does respond appropriately.     ED Results / Procedures / Treatments   Labs (all labs ordered are listed, but only abnormal results are displayed) Labs Reviewed  CBC WITH DIFFERENTIAL/PLATELET - Abnormal; Notable for the following components:      Result Value   WBC 11.9 (*)    RBC 3.09 (*)    Hemoglobin 9.4 (*)    HCT 31.9 (*)    MCV 103.2 (*)    MCHC 29.5 (*)    RDW 19.7 (*)    Neutro Abs 8.4 (*)    Monocytes Absolute 1.6 (*)    Abs Immature Granulocytes 0.08 (*)    All other components within normal limits  COMPREHENSIVE METABOLIC PANEL    EKG EKG Interpretation  Date/Time:  Wednesday October 02 2019 07:10:39 EST Ventricular Rate:  118 PR Interval:    QRS Duration: 141 QT Interval:  353 QTC Calculation: 495 R Axis:   49 Text Interpretation: Sinus  tachycardia Left bundle branch block No significant change since 07/27/2019 Confirmed by Veryl Speak [phone]) on 10/02/2019 7:13:24 AM   Radiology DG Chest Port 1 View  Result Date: 10/02/2019 CLINICAL DATA:  Altered level of consciousness. Coronary artery disease. Diastolic congestive heart failure. Chronic kidney disease. EXAM: PORTABLE CHEST 1 VIEW COMPARISON:  08/04/2019 FINDINGS: Stable mild cardiomegaly. Right jugular dual-lumen central venous catheter remains in appropriate position. Prior CABG again noted. No evidence of pulmonary infiltrate or edema. No evidence of pleural effusion. IMPRESSION: Stable mild cardiomegaly. No acute findings. Electronically Signed   By: Marlaine Hind M.D.   On: 10/02/2019 08:15    Procedures Procedures (including critical care time)  Medications Ordered in ED Medications - No data to display  ED Course  I have reviewed the triage vital signs and the nursing notes.  Pertinent labs & imaging results that were available during my care of the patient were reviewed by me and considered in my medical decision making (see chart for details).    MDM Rules/Calculators/A&P  Patient is a 79 year old female with extensive past medical history including end-stage renal disease on hemodialysis, diabetes, coronary artery disease and prior CVA with right-sided hemiplegia.  She is brought by EMS for evaluation of altered mental status.  Patient arrives somewhat confused.  She has her baseline right-sided hemiplegia, but also has increased weakness of the contralateral side.  Work-up initiated including head CT and laboratory studies.  CT suggestive of additional infarct.  This was discussed with Dr. Leonel Ramsay who recommends an MRI.  This shows both extension  of the prior area of stroke and contralateral acute stroke.  Patient admitted to the hospitalist service for further work-up and consideration of anticoagulation.  CRITICAL CARE Performed by: Veryl Speak Total critical care time: 35 minutes Critical care time was exclusive of separately billable procedures and treating other patients. Critical care was necessary to treat or prevent imminent or life-threatening deterioration. Critical care was time spent personally by me on the following activities: development of treatment plan with patient and/or surrogate as well as nursing, discussions with consultants, evaluation of patient's response to treatment, examination of patient, obtaining history from patient or surrogate, ordering and performing treatments and interventions, ordering and review of laboratory studies, ordering and review of radiographic studies, pulse oximetry and re-evaluation of patient's condition.   Final Clinical Impression(s) / ED Diagnoses Final diagnoses:  None    Rx / DC Orders ED Discharge Orders    None       Veryl Speak, MD 10/03/19 [id number]    Veryl Speak, MD 10/03/19 [phone]

## 2019-10-02 NOTE — ED Notes (Signed)
Spoke with pt's husband, he states the CNA prepared all of pt's medications last night before leaving home, pt's husband administered medications although does not know what was given.  Also states pt is paralyzed on the right side from stroke in 2020. Pt has dialysis M-W-F.  Last full treatment was Monday.

## 2019-10-02 NOTE — ED Notes (Signed)
Pt returned from CT at this time. Pt attached to cardiac monitor, placed in position of comfort and lights dimmed.

## 2019-10-02 NOTE — ED Notes (Signed)
Pt returned from MRI at this time

## 2019-10-02 NOTE — Plan of Care (Signed)

## 2019-10-02 NOTE — ED Notes (Signed)
Pt is A-fib w/ BBB on monitor 

## 2019-10-02 NOTE — Consult Note (Signed)
Cardiology Consultation:   Patient ID: [name] MRN: [id number]; DOB: Feb 01, 1941  Admit date: 10/02/2019 Date of Consult: 10/02/2019  Primary Care Provider: [name], MD Primary Cardiologist: Loralie Champagne, MD Truitt Merle, NP) Primary Electrophysiologist:  None    Patient Profile:   [name] is a 79 y.o. female with a history of CVA, CAD s/p CABG, chronic diastolic CHF, chronic anemia, HTN, DM, HLD, ESRD on HD who is being admitted with acute CVA. We are being consulted by Dr. Lorin Mercy.   History of Present Illness:   [name] with a history of CVA, CAD s/p CABG, chronic diastolic CHF, chronic anemia, HTN, DM, HLD, ESRD on HD who is being admitted with acute CVA. We are being consulted by Dr. Lorin Mercy. She is known to have two vessel CAD by cath in 2019 and medical therapy was recommended. She was not felt be a candidate for PCI. LVEF=25-30% by echo December 2020. She is known to have moderate AS. She is now on HD for ESRD. She was admitted to [facility] 07/22/20-07/26/20 with a CVA with residual right-sided hemiplegia. She as made DNR at that time and palliative care was discussed. She was found non-responsive today and is found to have new embolic CVA on MRI. A high sensitivity troponin was checked in the ED and is elevated.   She denies pain.   Past Medical History:  Diagnosis Date  . Anemia    Pt is taking iron.   [name] Anxiety   . Arthritis   . Carotid stenosis    40-59% bilateral ICA stenosis in 2/12.  . Chronic low back pain   . CKD (chronic kidney disease)    Dr. Audie Clear at [facility]  . Coronary artery disease    Pt presented 2/10 to [facility] with NSTEMI and diastolic CHF exacerbation.  LHC was done  3/10 showing 99% pRCA stenosis and 80% calcified pLAD stenosis with L=>R collaterals.  Pt was referred  for CABG which was done by Dr. Prescott Gum with LIMA-LAD, SVG-RCA, SVG-OM.  . Diabetes mellitus   . Diabetic neuropathy ([place])   . Diastolic CHF (HCC)    Echo  (2/10) showed EF 55-65%, mild LVH, diastolic dysfunction, mild AS with mean gradient 12 mmHg, PASP 43 mmHg.  Echo (2/12): EF 55-60%, mild LVH, mild AS (mean gradient 12), PA systolic pressure 32 mmHg.     [name] GERD (gastroesophageal reflux disease)   . Heart murmur   . Hyperlipidemia   . Hypertension   . Mild aortic stenosis    mean gradient 12 mmHg in 2/12.  . Myocardial infarction ([place])    "mild"  . Pneumonia   . PONV (postoperative nausea and vomiting)   . Stroke [facility])    " mild"  . Thrombocytopenia ([place])   . Unsteady gait     Past Surgical History:  Procedure Laterality Date  . AORTIC ARCH ANGIOGRAPHY N/A 10/18/2018   Procedure: AORTIC ARCH ANGIOGRAPHY;  Surgeon: Marty Heck, MD;  Location: [place] [facility];  Service: Cardiovascular;  Laterality: N/A;  . AV FISTULA PLACEMENT Left 01/30/2015   Procedure: Creation of a Radial Cephalic AV Fistula left wrist;  Surgeon: [name], MD;  Location: [place];  Service: Vascular;  Laterality: Left;  . AV FISTULA PLACEMENT Left 09/06/2018   Procedure: LEFT ARM ARTERIOVENOUS (AV) FISTULA CREATION;  Surgeon: Marty Heck, MD;  Location: [place];  Service: Vascular;  Laterality: Left;  . BACK SURGERY  multiple  . BREAST SURGERY     biopsy  . BUBBLE STUDY  08/02/2019   Procedure: BUBBLE STUDY;  Surgeon: Skeet Latch, MD;  Location: [place];  Service: Cardiovascular;;  . CARDIAC CATHETERIZATION    . CATARACT EXTRACTION W/ INTRAOCULAR LENS  IMPLANT, BILATERAL    . COLONOSCOPY Left 11/03/2016   Procedure: COLONOSCOPY;  Surgeon: [name], MD;  Location: [facility] ENDOSCOPY;  Service: Endoscopy;  Laterality: Left;  . COLONOSCOPY W/ BIOPSIES AND POLYPECTOMY    . CORONARY ARTERY BYPASS GRAFT  09/2008   pt with NSTEMI and diastolic CHF exacerbation.  LHC was done  3/10 showing 99% pRCA stenosis and 80% calcified pLAD stenosis with L=>R collaterals.  Pt was referred  for CABG which was done by Dr. Prescott Gum with LIMA-LAD,  SVG-RCA, SVG-OM.  [name] ESOPHAGOGASTRODUODENOSCOPY N/A 11/02/2016   Procedure: ESOPHAGOGASTRODUODENOSCOPY (EGD);  Surgeon: [name], MD;  Location: [facility] ENDOSCOPY;  Service: Endoscopy;  Laterality: N/A;  . GIVENS CAPSULE STUDY N/A 11/29/2016   Procedure: GIVENS CAPSULE STUDY;  Surgeon: [name], MD;  Location: [place];  Service: Endoscopy;  Laterality: N/A;  . LIGATION OF ARTERIOVENOUS  FISTULA Left 09/13/2018   Procedure: LIGATION OF LEFT ARTERIOVENOUS  FISTULA;  Surgeon: Waynetta Sandy, MD;  Location: [place];  Service: Vascular;  Laterality: Left;  . REVISON OF ARTERIOVENOUS FISTULA Left 07/02/2015   Procedure: REVISON OF LEFT RADIOCEPHALIC ARTERIOVENOUS FISTULA;  Surgeon: [name], MD;  Location: [place];  Service: Vascular;  Laterality: Left;  . RIGHT/LEFT HEART CATH AND CORONARY/GRAFT ANGIOGRAPHY N/A 05/18/2018   Procedure: RIGHT/LEFT HEART CATH AND CORONARY/GRAFT ANGIOGRAPHY;  Surgeon: Leonie Man, MD;  Location: [place] [facility];  Service: Cardiovascular;  Laterality: N/A;  . TEE WITHOUT CARDIOVERSION N/A 08/02/2019   Procedure: TRANSESOPHAGEAL ECHOCARDIOGRAM (TEE);  Surgeon: Skeet Latch, MD;  Location: [place];  Service: Cardiovascular;  Laterality: N/A;  . UPPER EXTREMITY ANGIOGRAPHY Left 10/18/2018   Procedure: UPPER EXTREMITY ANGIOGRAPHY;  Surgeon: Marty Heck, MD;  Location: [place] [facility];  Service: Cardiovascular;  Laterality: Left;     Home Medications:  Prior to Admission medications   Medication Sig Start Date End Date Taking? Authorizing Provider  acetaminophen (TYLENOL) 325 MG tablet Take 2 tablets (650 mg total) by mouth every 4 (four) hours as needed for mild pain (or temp > 37.5 C (99.5 F)). 08/22/19  Yes Angiulli, Lavon Paganini, PA-C  ALPRAZolam Duanne Moron) 1 MG tablet Take 1 mg by mouth at bedtime. 09/16/19  Yes [provider]  aspirin EC 81 MG EC tablet Take 1 tablet (81 mg total) by mouth daily. 07/27/19  Yes Florencia Reasons, MD    folic acid (FOLVITE) 1 MG tablet Take 1 tablet (1 mg total) by mouth daily. 08/22/19  Yes Angiulli, Lavon Paganini, PA-C  gabapentin (NEURONTIN) 100 MG capsule Take 100 mg by mouth at bedtime. 09/19/19  Yes [provider]  miconazole nitrate (MICATIN) POWD Apply 1 application topically 2 (two) times daily. 08/22/19  Yes Angiulli, Lavon Paganini, PA-C  midodrine (PROAMATINE) 10 MG tablet Take 1 tablet (10 mg total) by mouth 3 (three) times a week. Patient takes on dialysis days, M,W,F 08/23/19  Yes Angiulli, Lavon Paganini, PA-C  multivitamin (RENA-VIT) TABS tablet Take 1 tablet by mouth at bedtime. 08/22/19  Yes Angiulli, Lavon Paganini, PA-C  pantoprazole (PROTONIX) 40 MG tablet Take 1 tablet (40 mg total) by mouth 2 (two) times daily. 08/22/19  Yes Angiulli, Lavon Paganini, PA-C  rosuvastatin (CRESTOR) 20 MG tablet  Take 1 tablet (20 mg total) by mouth daily at 6 PM. 08/22/19  Yes Angiulli, Lavon Paganini, PA-C  saccharomyces boulardii (FLORASTOR) 250 MG capsule Take 1 capsule (250 mg total) by mouth 2 (two) times daily. 08/22/19  Yes Angiulli, Lavon Paganini, PA-C  sevelamer carbonate (RENVELA) 800 MG tablet Take 1,600 mg by mouth 3 (three) times daily. 09/13/19  Yes [provider]  vitamin B-12 (CYANOCOBALAMIN) 500 MCG tablet Take 1 tablet (500 mcg total) by mouth daily. 08/22/19  Yes Angiulli, Lavon Paganini, PA-C    Inpatient Medications: Scheduled Meds: .  stroke: mapping our early stages of recovery book   Does not apply Once  . ALPRAZolam  1 mg Oral QHS  . [START ON 10/03/2019] aspirin  81 mg Oral Daily  . gabapentin  100 mg Oral QHS  . heparin  5,000 Units Subcutaneous Q8H  . insulin aspart  0-6 Units Subcutaneous TID WC  . multivitamin  1 tablet Oral QHS  . pantoprazole  40 mg Oral BID  . rosuvastatin  20 mg Oral q1800  . sevelamer carbonate  1,600 mg Oral TID   Continuous Infusions: . sodium chloride 50 mL/hr at 10/02/19 1443  . sodium chloride     PRN Meds: sodium chloride, acetaminophen **OR** acetaminophen (TYLENOL)  oral liquid 160 mg/5 mL **OR** acetaminophen, calcium carbonate (dosed in mg elemental calcium), camphor-menthol **AND** hydrOXYzine, docusate sodium, feeding supplement (NEPRO CARB STEADY), ondansetron **OR** ondansetron (ZOFRAN) IV, senna-docusate, sorbitol, zolpidem  Allergies:    Allergies  Allergen Reactions  . Doxycycline Nausea And Vomiting    Caused "DEATHLY NAUSEA AND VOMITING"  . Lipitor [Atorvastatin] Other (See Comments)    Stomach pain  . Strawberry Extract Rash    Social History:   Social History   Socioeconomic History  . Marital status: Married    Spouse name: Linton Ham  . Number of children: 1  . Years of education: Not on file  . Highest education level: Not on file  Occupational History  . Occupation: retired from office work  Tobacco Use  . Smoking status: Former Smoker    Types: Cigarettes  . Smokeless tobacco: Never Used  . Tobacco comment: quit 1988  Substance and Sexual Activity  . Alcohol use: Yes    Comment: daily  . Drug use: No  . Sexual activity: Not Currently    Partners: Male  Other Topics Concern  . Not on file  Social History Narrative  . Not on file   Social Determinants of Health   Financial Resource Strain:   . Difficulty of Paying Living Expenses: Not on file  Food Insecurity:   . Worried About Charity fundraiser in the Last Year: Not on file  . Ran Out of Food in the Last Year: Not on file  Transportation Needs:   . Lack of Transportation (Medical): Not on file  . Lack of Transportation (Non-Medical): Not on file  Physical Activity:   . Days of Exercise per Week: Not on file  . Minutes of Exercise per Session: Not on file  Stress:   . Feeling of Stress : Not on file  Social Connections:   . Frequency of Communication with Friends and Family: Not on file  . Frequency of Social Gatherings with Friends and Family: Not on file  . Attends Religious Services: Not on file  . Active Member of Clubs or Organizations: Not on file    . Attends Archivist Meetings: Not on file  . Marital Status: Not  on file  Intimate Partner Violence:   . Fear of Current or Ex-Partner: Not on file  . Emotionally Abused: Not on file  . Physically Abused: Not on file  . Sexually Abused: Not on file    Family History:  Unable to obtain.  Family History  Adopted: Yes  Family history unknown: Yes     ROS:  Please see the history of present illness.   Physical Exam/Data:   Vitals:   10/02/19 1545 10/02/19 1600 10/02/19 1630 10/02/19 1700  BP:  (!) 106/59 106/66 108/60  Pulse: (!) 124 (!) 121 (!) 124 (!) 125  Resp: (!) 33 17 18 (!) 22  Temp:      TempSrc:      SpO2: 97% 93% 95% 95%  Weight:      Height:       No intake or output data in the 24 hours ending 10/02/19 1726 Last 3 Weights 10/02/2019 09/10/2019 08/24/2019  Weight (lbs) 113 lb 12.1 oz 114 lb 111 lb 12.4 oz  Weight (kg) 51.6 kg 51.71 kg 50.7 kg     Body mass index is 20.16 kg/m.  General:  Pt answers questions. Cachectic HEENT: normal Lymph: no adenopathy Neck: no JVD Endocrine:  No thryomegaly Cardiac: RRR, systolic murmur.  Lungs:  clear to auscultation bilaterally, no wheezing, rhonchi or rales  Abd: soft, nontender, no hepatomegaly  Ext: no edema Musculoskeletal:  No deformities, BUE and BLE strength normal and equal Skin: warm and dry  Neuro: Unable to move extremities Psych:  Awake, flat affect  EKG:  The EKG was personally reviewed and demonstrates:  Sinus tach, LBBB Telemetry:  Telemetry was personally reviewed and demonstrates:  Sinus tach  Relevant CV Studies:   Laboratory Data:  High Sensitivity Troponin:   Recent Labs  Lab 10/02/19 1344  TROPONINIHS 1,597*     Chemistry Recent Labs  Lab 10/02/19 0734  NA 136  K 4.2  CL 95*  CO2 24  GLUCOSE 109*  BUN 49*  CREATININE 5.03*  CALCIUM 8.6*  GFRNONAA 8*  GFRAA 9*  ANIONGAP 17*    Recent Labs  Lab 10/02/19 0734  PROT 6.0*  ALBUMIN 2.0*  AST 21  ALT 14  ALKPHOS  82  BILITOT 0.7   Hematology Recent Labs  Lab 10/02/19 0734  WBC 11.9*  RBC 3.09*  HGB 9.4*  HCT 31.9*  MCV 103.2*  MCH 30.4  MCHC 29.5*  RDW 19.7*  PLT 161   BNPNo results for input(s): BNP, PROBNP in the last 168 hours.  DDimer No results for input(s): DDIMER in the last 168 hours.   Radiology/Studies:  CT Head Wo Contrast  Result Date: 10/02/2019 CLINICAL DATA:  Ataxia and lethargy EXAM: CT HEAD WITHOUT CONTRAST TECHNIQUE: Contiguous axial images were obtained from the base of the skull through the vertex without intravenous contrast. COMPARISON:  July 23, 2019 FINDINGS: Brain: There is mild diffuse atrophy, stable. There is no intracranial mass, hemorrhage, extra-axial fluid collection, or midline shift. There is evidence of infarct in the superior left frontal lobe extending to the left frontoparietal junction, not present on most recent CT and felt to be recent. There is patchy small vessel disease in the centra semiovale, stable. Prior small lacunar infarct in the superior left lentiform nucleus is stable. Prior small infarct in the left thalamus noted. Prior infarct noted in the right insular cortex region as well as in the anterior limb of the right internal capsule. There is also a prior small infarct  in the mid right lentiform nucleus. Vascular: There is no appreciable hyperdense vessel. There is arterial vascular calcification in the distal vertebral arteries and carotid siphon regions bilaterally. Skull: The bony calvarium appears intact. Sinuses/Orbits: There is mucosal thickening in the posterior right maxillary antrum. There is mucosal thickening in several ethmoid air cells bilaterally. Orbits appear symmetric bilaterally. Other: There is opacification of multiple mastoid air cells bilaterally. IMPRESSION: 1. Recent and potentially acute infarct in the superior left frontal lobe with involvement of the left frontal-parietal junction near the vertex as well. 2. Underlying  atrophy with periventricular small vessel disease. Prior infarcts in the basal ganglia regions, left thalamus, and right insular cortex regions noted. No evident mass or hemorrhage. 3.  Foci of arterial vascular calcification noted. 4. Areas of paranasal sinus disease and bilateral mastoid air cell disease noted. Electronically Signed   By: Lowella Grip III M.D.   On: 10/02/2019 08:54   MR BRAIN WO CONTRAST  Result Date: 10/02/2019 CLINICAL DATA:  Ataxia, stroke suspected. Additional history provided: Decreased level of consciousness, history of CVA. EXAM: MRI HEAD WITHOUT CONTRAST TECHNIQUE: Multiplanar, multiecho pulse sequences of the brain and surrounding structures were obtained without intravenous contrast. COMPARISON:  Noncontrast head CT 10/02/2019, brain MRI 07/23/2019. FINDINGS: Brain: Intermittently motion degraded examination. Most notably there is moderate motion degradation of the sagittal T1 weighted sequence. Large late subacute left anterior cerebral artery vascular territory cortically based infarct predominantly involving the paramedian left frontal lobe with only minimal involvement of the adjacent left parietal lobe. This infarct is more extensive as compared to prior examination 07/23/2019. Corresponding chronic petechial hemorrhage now present at this site. There are several small foci of diffusion weighted hyperintensity which are too small to accurately characterize on the ADC map but likely reflect acute/early subacute infarcts as follows. Punctate infarct within the posterior body of the corpus callosum (series 5, image 79). Punctate infarct within the right corona radiata (series 5, image 77). Punctate infarct within the right frontal lobe white matter adjacent to the right frontal horn (series 5, image 75). Punctate infarct within the left occipital lobe periatrial white matter (series 5, image 70). Punctate infarct within the left cerebellum (series 5, image 56). There is no  evidence of intracranial mass. No midline shift or extra-axial fluid collection. Moderate/advanced ill-defined and scattered T2/FLAIR hyperintensity within the cerebral white matter is nonspecific, but consistent with chronic small vessel ischemic disease. Redemonstrated are multiple chronic lacunar infarcts within the cerebral white matter, right thalamus and bilateral basal ganglia. Fairly extensive chronic ischemic changes are also again noted within the pons. Moderate generalized parenchymal atrophy Vascular: Flow voids maintained within the proximal large arterial vessels. Skull and upper cervical spine: Within the limitations of motion degradation, no focal marrow lesion is identified. Sinuses/Orbits: Visualized orbits demonstrate no acute abnormality. Minimal ethmoid sinus mucosal thickening bilateral mastoid effusions. IMPRESSION: 1. Large late subacute left ACA territory infarct which has increased in extent as compared to MRI 07/23/2019, although without evidence of superimposed acute infarct on the current exam. Chronic petechial hemorrhage now present at this site. 2. Multiple punctate acute/early subacute infarcts within the bilateral cerebral hemispheres, corpus callosum and left cerebellum as described. Given involvement of multiple vascular territories, correlate for an embolic phenomenon. 3. Stable background moderate/advanced chronic small vessel ischemic disease with multiple chronic lacunar infarcts. 4. Moderate generalized parenchymal atrophy. Electronically Signed   By: Kellie Simmering DO   On: 10/02/2019 11:14   DG Chest Port 1 View  Result Date:  10/02/2019 CLINICAL DATA:  Altered level of consciousness. Coronary artery disease. Diastolic congestive heart failure. Chronic kidney disease. EXAM: PORTABLE CHEST 1 VIEW COMPARISON:  08/04/2019 FINDINGS: Stable mild cardiomegaly. Right jugular dual-lumen central venous catheter remains in appropriate position. Prior CABG again noted. No evidence of  pulmonary infiltrate or edema. No evidence of pleural effusion. IMPRESSION: Stable mild cardiomegaly. No acute findings. Electronically Signed   By: Marlaine Hind M.D.   On: 10/02/2019 08:15    Assessment and Plan:   1. Acute CVA 2. Elevated troponin 3. CAD 4. Moderate aortic stenosis  She is being admitted with an acute CVA. Her troponin is elevated. Her presentation is c/w acute CVA. She is DNR and critically ill at this time. She is known to have severe multi-vessel CAD and has not been felt to be a candidate for PCI. She is not a candidate for invasive cardiac workup going forward. Agree with supportive care and palliation.   For questions or updates, please contact [place] Please consult [email or web address] for contact info under   Signed, Lauree Chandler, MD  10/02/2019 5:26 PM

## 2019-10-02 NOTE — ED Notes (Signed)
Pt transported to Head CT at this time.

## 2019-10-03 DIAGNOSIS — I5040 Unspecified combined systolic (congestive) and diastolic (congestive) heart failure: Secondary | ICD-10-CM

## 2019-10-03 DIAGNOSIS — Z66 Do not resuscitate: Secondary | ICD-10-CM

## 2019-10-03 DIAGNOSIS — I10 Essential (primary) hypertension: Secondary | ICD-10-CM

## 2019-10-03 DIAGNOSIS — E1165 Type 2 diabetes mellitus with hyperglycemia: Secondary | ICD-10-CM

## 2019-10-03 DIAGNOSIS — E785 Hyperlipidemia, unspecified: Secondary | ICD-10-CM

## 2019-10-03 DIAGNOSIS — I5032 Chronic diastolic (congestive) heart failure: Secondary | ICD-10-CM

## 2019-10-03 DIAGNOSIS — Z515 Encounter for palliative care: Secondary | ICD-10-CM

## 2019-10-03 DIAGNOSIS — I639 Cerebral infarction, unspecified: Secondary | ICD-10-CM

## 2019-10-03 LAB — RENAL FUNCTION PANEL
Albumin: 2.8 g/dL — ABNORMAL LOW (ref 3.5–5.0)
Anion gap: 17 — ABNORMAL HIGH (ref 5–15)
BUN: 62 mg/dL — ABNORMAL HIGH (ref 8–23)
CO2: 23 mmol/L (ref 22–32)
Calcium: 9.1 mg/dL (ref 8.9–10.3)
Chloride: 93 mmol/L — ABNORMAL LOW (ref 98–111)
Creatinine, Ser: 6.1 mg/dL — ABNORMAL HIGH (ref 0.44–1.00)
GFR calc Af Amer: 7 mL/min — ABNORMAL LOW (ref >60)
GFR calc non Af Amer: 6 mL/min — ABNORMAL LOW (ref >60)
Glucose, Bld: 205 mg/dL — ABNORMAL HIGH (ref 70–99)
Phosphorus: 5.4 mg/dL — ABNORMAL HIGH (ref 2.5–4.6)
Potassium: 4.8 mmol/L (ref 3.5–5.1)
Sodium: 133 mmol/L — ABNORMAL LOW (ref 135–145)

## 2019-10-03 LAB — CBC
HCT: 32.5 % — ABNORMAL LOW (ref 36.0–46.0)
Hemoglobin: 10.2 g/dL — ABNORMAL LOW (ref 12.0–15.0)
MCH: 31.2 pg (ref 26.0–34.0)
MCHC: 31.4 g/dL (ref 30.0–36.0)
MCV: 99.4 fL (ref 80.0–100.0)
Platelets: 161 10*3/uL (ref 150–400)
RBC: 3.27 MIL/uL — ABNORMAL LOW (ref 3.87–5.11)
RDW: 19.5 % — ABNORMAL HIGH (ref 11.5–15.5)
WBC: 10.2 10*3/uL (ref 4.0–10.5)
nRBC: 0 % (ref 0.0–0.2)

## 2019-10-03 LAB — GLUCOSE, CAPILLARY
Glucose-Capillary: 130 mg/dL — ABNORMAL HIGH (ref 70–99)
Glucose-Capillary: 131 mg/dL — ABNORMAL HIGH (ref 70–99)
Glucose-Capillary: 184 mg/dL — ABNORMAL HIGH (ref 70–99)
Glucose-Capillary: 190 mg/dL — ABNORMAL HIGH (ref 70–99)
Glucose-Capillary: 77 mg/dL (ref 70–99)

## 2019-10-03 LAB — BASIC METABOLIC PANEL
Anion gap: 14 (ref 5–15)
BUN: 25 mg/dL — ABNORMAL HIGH (ref 8–23)
CO2: 24 mmol/L (ref 22–32)
Calcium: 8.8 mg/dL — ABNORMAL LOW (ref 8.9–10.3)
Chloride: 96 mmol/L — ABNORMAL LOW (ref 98–111)
Creatinine, Ser: 3.24 mg/dL — ABNORMAL HIGH (ref 0.44–1.00)
GFR calc Af Amer: 15 mL/min — ABNORMAL LOW (ref >60)
GFR calc non Af Amer: 13 mL/min — ABNORMAL LOW (ref >60)
Glucose, Bld: 254 mg/dL — ABNORMAL HIGH (ref 70–99)
Potassium: 3.7 mmol/L (ref 3.5–5.1)
Sodium: 134 mmol/L — ABNORMAL LOW (ref 135–145)

## 2019-10-03 LAB — MAGNESIUM: Magnesium: 1.5 mg/dL — ABNORMAL LOW (ref 1.7–2.4)

## 2019-10-03 MED ORDER — SODIUM CHLORIDE 0.9 % IV BOLUS
250.0000 mL | Freq: Once | INTRAVENOUS | Status: AC
  Administered 2019-10-03: 21:00:00 250 mL via INTRAVENOUS

## 2019-10-03 MED ORDER — PENTAFLUOROPROP-TETRAFLUOROETH EX AERO: 1.0000 "application " | INHALATION_SPRAY | CUTANEOUS | Status: DC | PRN

## 2019-10-03 MED ORDER — SODIUM CHLORIDE 0.9 % IV BOLUS
250.0000 mL | Freq: Once | INTRAVENOUS | Status: AC
  Administered 2019-10-03: 20:00:00 250 mL via INTRAVENOUS

## 2019-10-03 MED ORDER — HEPARIN SODIUM (PORCINE) 1000 UNIT/ML IJ SOLN
INTRAMUSCULAR | Status: AC
  Administered 2019-10-03: 17:00:00 4000 [IU]
  Filled 2019-10-03: qty 4

## 2019-10-03 MED ORDER — SODIUM CHLORIDE 0.9 % IV SOLN: 100.0000 mL | INTRAVENOUS | Status: DC | PRN

## 2019-10-03 MED ORDER — ALBUMIN HUMAN 25 % IV SOLN
25.0000 g | Freq: Once | INTRAVENOUS | Status: AC
  Administered 2019-10-03: 23:00:00 25 g via INTRAVENOUS
  Filled 2019-10-03: qty 100

## 2019-10-03 MED ORDER — ALTEPLASE 2 MG IJ SOLR: 2.0000 mg | Freq: Once | INTRAMUSCULAR | Status: DC | PRN

## 2019-10-03 MED ORDER — ALBUMIN HUMAN 25 % IV SOLN
25.0000 g | Freq: Once | INTRAVENOUS | Status: AC
  Administered 2019-10-03: 03:00:00 25 g via INTRAVENOUS
  Filled 2019-10-03: qty 100

## 2019-10-03 MED ORDER — MAGNESIUM SULFATE 4 GM/100ML IV SOLN
4.0000 g | Freq: Once | INTRAVENOUS | Status: AC
  Administered 2019-10-03: 21:00:00 4 g via INTRAVENOUS
  Filled 2019-10-03: qty 100

## 2019-10-03 MED ORDER — LIDOCAINE HCL (PF) 1 % IJ SOLN: 5.0000 mL | INTRAMUSCULAR | Status: DC | PRN

## 2019-10-03 MED ORDER — MIDODRINE HCL 5 MG PO TABS
5.0000 mg | ORAL_TABLET | Freq: Once | ORAL | Status: AC | PRN
  Administered 2019-10-03: 12:00:00 5 mg via ORAL
  Filled 2019-10-03: qty 1

## 2019-10-03 MED ORDER — LIDOCAINE-PRILOCAINE 2.5-2.5 % EX CREA
1.0000 "application " | TOPICAL_CREAM | CUTANEOUS | Status: DC | PRN
  Filled 2019-10-03: qty 5

## 2019-10-03 MED ORDER — CHLORHEXIDINE GLUCONATE CLOTH 2 % EX PADS
6.0000 | MEDICATED_PAD | Freq: Every day | CUTANEOUS | Status: DC
  Administered 2019-10-03 – 2019-10-04 (×2): 6 via TOPICAL

## 2019-10-03 MED ORDER — NEPRO/CARBSTEADY PO LIQD
237.0000 mL | Freq: Three times a day (TID) | ORAL | Status: DC
  Administered 2019-10-03 – 2019-10-05 (×4): 237 mL via ORAL

## 2019-10-03 MED ORDER — MIDODRINE HCL 5 MG PO TABS
5.0000 mg | ORAL_TABLET | Freq: Once | ORAL | Status: AC
  Administered 2019-10-03: 5 mg via ORAL
  Filled 2019-10-03: qty 1

## 2019-10-03 NOTE — Plan of Care (Signed)

## 2019-10-03 NOTE — Consult Note (Signed)
Neurology Consultation  Reason for Consult: Stroke Referring Physician: Dr. Benny Lennert  CC: Altered mental status  History is obtained from: Chart  HPI: NATIRA LUNGREN is a 79 y.o. female with past medical history of left brain stroke, thrombocytopenia, myocardial infarct, hypertension, hyperlipidemia, CAD, carotid stenosis and diabetes.  Most of information is gathered from chart secondary to patient not able to give a good history.  Patient was previously admitted from 07/23/2019 and then was admitted to CIR through 08/24/2019.  Upon returning home per husband she was having good and bad days.  She was flaccid on the right side.  On day of admission he noted that he was having difficulty getting her to respond.  Patient was brought to the hospital for further evaluation.  Patient had a MRI while in the hospital and was found to have extension of her prior large left MCA with new embolic CVAs on MRI.  For that reason neurology was consulted.  On consultation patient is alert and oriented.  As noted she is capable of telling me that she has a stroke.  Patient is slightly unclear why she was actually brought to the hospital.  During exam she is able to easily tell me about her previous symptoms that she had.  Currently she has no complaints other than that she wants to drink.  Currently speech therapy is in the room doing a evaluation.  Chart review (patient was seen on 07/23/2019 for right-sided weakness.  She was found to have a dense right hemiplegia.  MRI showed a distal left ACA territory infarct.  2D echo at that time showed 25 to 30% ejection fraction with severely decreased function.  TEE showed EF that was 25 to 30%.  Left ventricular has moderate to severely decreased function there.  LDL was 16, HbA1c was 5.3, patient was placed on 81 mg EC secondary to history of GIB.  Patient was also seen on 09/27/2016 for a right thalamic small infarct secondary to small vessel disease.  LKW: Unknown tpa  given?: no, out of window Premorbid modified Rankin scale (mRS): 4 NIH stroke scale: 11  Past Medical History:  Diagnosis Date  . Anemia    Pt is taking iron.   [name] Anxiety   . Arthritis   . Carotid stenosis    40-59% bilateral ICA stenosis in 2/12.  . Chronic low back pain   . CKD (chronic kidney disease)    Dr. Audie Clear at [facility]  . Coronary artery disease    Pt presented 2/10 to [facility] with NSTEMI and diastolic CHF exacerbation.  LHC was done  3/10 showing 99% pRCA stenosis and 80% calcified pLAD stenosis with L=>R collaterals.  Pt was referred  for CABG which was done by Dr. Prescott Gum with LIMA-LAD, SVG-RCA, SVG-OM.  . Diabetes mellitus   . Diabetic neuropathy ([place])   . Diastolic CHF (HCC)    Echo (2/10) showed EF 55-65%, mild LVH, diastolic dysfunction, mild AS with mean gradient 12 mmHg, PASP 43 mmHg.  Echo (2/12): EF 55-60%, mild LVH, mild AS (mean gradient 12), PA systolic pressure 32 mmHg.     [name] GERD (gastroesophageal reflux disease)   . Heart murmur   . Hyperlipidemia   . Hypertension   . Mild aortic stenosis    mean gradient 12 mmHg in 2/12.  . Myocardial infarction ([place])    "mild"  . Pneumonia   . PONV (postoperative nausea and vomiting)   . Stroke [facility])    " mild"  .  Thrombocytopenia ([place])   . Unsteady gait     Family History  Adopted: Yes  Family history unknown: Yes   Social History:   reports that she has quit smoking. Her smoking use included cigarettes. She has never used smokeless tobacco. She reports current alcohol use. She reports that she does not use drugs.  Medications  Current Facility-Administered Medications:  .   stroke: mapping our early stages of recovery book, , Does not apply, Once, Karmen Bongo, MD .  0.9 %  sodium chloride infusion, 100 mL, Intravenous, PRN, Rosita Fire, MD, Last Rate: 999 mL/hr at 10/02/19 1911, 100 mL at 10/02/19 1911 .  0.9 %  sodium chloride infusion, 100 mL, Intravenous, PRN, Madelon Lips, MD .  0.9 %  sodium chloride infusion, 100 mL, Intravenous, PRN, Madelon Lips, MD .  acetaminophen (TYLENOL) tablet 650 mg, 650 mg, Oral, Q4H PRN **OR** acetaminophen (TYLENOL) 160 MG/5ML solution 650 mg, 650 mg, Per Tube, Q4H PRN **OR** acetaminophen (TYLENOL) suppository 650 mg, 650 mg, Rectal, Q4H PRN, Karmen Bongo, MD .  alteplase (CATHFLO ACTIVASE) injection 2 mg, 2 mg, Intracatheter, Once PRN, Madelon Lips, MD .  aspirin EC tablet 81 mg, 81 mg, Oral, Daily, Karmen Bongo, MD .  calcium carbonate (dosed in mg elemental calcium) suspension 500 mg of elemental calcium, 500 mg of elemental calcium, Oral, Q6H PRN, Karmen Bongo, MD .  camphor-menthol [facility]) lotion 1 application, 1 application, Topical, [id number] PRN **AND** hydrOXYzine (ATARAX/VISTARIL) tablet 25 mg, 25 mg, Oral, Q8H PRN, Karmen Bongo, MD .  Chlorhexidine Gluconate Cloth 2 % PADS 6 each, 6 each, Topical, Daily, Karmen Bongo, MD, 6 each at 10/02/19 2045 .  Chlorhexidine Gluconate Cloth 2 % PADS 6 each, 6 each, Topical, Q0600, Madelon Lips, MD, 6 each at 10/03/19 [phone] .  dextrose 10 % infusion, , Intravenous, Continuous, Kirby-Graham, Karsten Fells, NP, Last Rate: 50 mL/hr at 10/03/19 0018, New Bag at 10/03/19 0018 .  docusate sodium (ENEMEEZ) enema 283 mg, 1 enema, Rectal, PRN, Karmen Bongo, MD .  feeding supplement (NEPRO CARB STEADY) liquid 237 mL, 237 mL, Oral, TID PRN, Karmen Bongo, MD .  gabapentin (NEURONTIN) capsule 100 mg, 100 mg, Oral, QHS, Karmen Bongo, MD .  heparin injection 5,000 Units, 5,000 Units, Subcutaneous, Q8H, Karmen Bongo, MD, 5,000 Units at 10/03/19 0500 .  insulin aspart (novoLOG) injection 0-6 Units, 0-6 Units, Subcutaneous, Q4H, Kirby-Graham, Karsten Fells, NP .  lidocaine (PF) (XYLOCAINE) 1 % injection 5 mL, 5 mL, Intradermal, PRN, Madelon Lips, MD .  lidocaine-prilocaine (EMLA) cream 1 application, 1 application, Topical, PRN, Madelon Lips, MD .  midodrine  (PROAMATINE) tablet 5 mg, 5 mg, Oral, Once PRN, Madelon Lips, MD .  multivitamin (RENA-VIT) tablet 1 tablet, 1 tablet, Oral, QHS, Karmen Bongo, MD .  ondansetron [facility]) tablet 4 mg, 4 mg, Oral, Q6H PRN **OR** ondansetron (ZOFRAN) injection 4 mg, 4 mg, Intravenous, Q6H PRN, Karmen Bongo, MD .  pantoprazole (PROTONIX) EC tablet 40 mg, 40 mg, Oral, BID, Karmen Bongo, MD .  pentafluoroprop-tetrafluoroeth [facility]) aerosol 1 application, 1 application, Topical, PRN, Madelon Lips, MD .  rosuvastatin (CRESTOR) tablet 20 mg, 20 mg, Oral, q1800, Karmen Bongo, MD .  senna-docusate (Senokot-S) tablet 1 tablet, 1 tablet, Oral, QHS PRN, Karmen Bongo, MD .  sevelamer carbonate (RENVELA) tablet 1,600 mg, 1,600 mg, Oral, TID WC, Einar Grad, RPH .  sorbitol 70 % solution 30 mL, 30 mL, Oral, PRN, Karmen Bongo, MD  ROS:   General ROS: negative for -  chills, fatigue, fever, night sweats, weight gain or weight loss Psychological ROS: Positive for -confusion Ophthalmic ROS: negative for - blurry vision, double vision, eye pain or loss of vision ENT ROS: negative for - epistaxis, nasal discharge, oral lesions, sore throat, tinnitus or vertigo Respiratory ROS: negative for - cough, hemoptysis, shortness of breath or wheezing Cardiovascular ROS: negative for - chest pain, dyspnea on exertion, edema or irregular heartbeat Gastrointestinal ROS: negative for - abdominal pain, diarrhea, hematemesis, nausea/vomiting or stool incontinence Genito-Urinary ROS: negative for - dysuria, hematuria, incontinence or urinary frequency/urgency Musculoskeletal ROS: Positive for -  muscular weakness-this is old from previous stroke Neurological ROS: as noted in HPI Dermatological ROS: Positive for bruises on arm especially left  Exam: Current vital signs: BP 98/70 (BP Location: Left Arm)   Pulse (!) 118   Temp 98.4 F (36.9 C) (Oral)   Resp 18   Ht 5\' 8"  (1.727 m)   Wt 52.5 kg   SpO2 99%    BMI 17.60 kg/m  Vital signs in last 24 hours: Temp:  [97.7 F (36.5 C)-98.6 F (37 C)] 98.4 F (36.9 C) (02/18 0853) Pulse Rate:  [84-130] 118 (02/18 0853) Resp:  [17-33] 18 (02/18 0853) BP: (81-112)/(51-75) 98/70 (02/18 0853) SpO2:  [90 %-100 %] 99 % (02/18 0853) Weight:  [52.5 kg] 52.5 kg (02/17 2042) Constitutional: Appears thin.  Psych: Affect appropriate to situation Eyes: No scleral injection HENT: No OP obstrucion Head: Normocephalic.  Cardiovascular: Normal rate and regular rhythm.  Respiratory: Effort normal, non-labored breathing GI: Soft.  No distension. There is no tenderness.  Skin: WDI  Neuro: Mental Status: Patient is awake, alert, oriented to person, place, not to time. Speech-she has intact naming, repeating, comprehension. She is not clear why she is in the hospital.  She believes it is because of a stroke but does not remember being altered Cranial Nerves: II: Visual Fields are full.  III,IV, VI: EOMI without ptosis or diploplia. Pupils equal, round and reactive to light V: Facial sensation is symmetric to temperature VII: When talking she has decreased movement on the left portion of her mouth VIII: hearing is intact to voice X: Palat elevates symmetrically XI: Shoulder shrug is symmetric. XII: tongue is midline without atrophy or fasciculations.  Motor: Patient cannot move her right arm or leg.  She has 5/5 on the left arm and 3/5 on the left leg Drift-left leg drifts to bed, no drift on left arm, unable to lift right arm or leg Sensory: Sensation is symmetric to light touch and temperature in the arms and legs. DSS Deep Tendon Reflexes: 2+ on the right patella and right brachioradialis.  Left patella is 2+ and left brachioradialis is 2+ and brisk Plantars: Mute on the right with downgoing on the left Cerebellar: FNF with no dysmetria on the left.  Unable to obtain heel-to-shin with left leg over right due to weakness  Labs I have reviewed labs  in epic and the results pertinent to this consultation are: CBC    Component Value Date/Time   WBC 11.9 (H) 10/02/2019 0734   RBC 3.09 (L) 10/02/2019 0734   HGB 9.4 (L) 10/02/2019 0734   HGB 11.3 06/26/2018 0936   HGB 9.8 (L) 08/02/2017 0839   HCT 31.9 (L) 10/02/2019 0734   HCT 34.6 06/26/2018 0936   HCT 31.1 (L) 08/02/2017 0839   PLT 161 10/02/2019 0734   PLT 103 (L) 06/26/2018 0936   MCV 103.2 (H) 10/02/2019 0734   MCV 104 (H) 06/26/2018  0936   MCV 98.7 08/02/2017 0839   MCH 30.4 10/02/2019 0734   MCHC 29.5 (L) 10/02/2019 0734   RDW 19.7 (H) 10/02/2019 0734   RDW 17.6 (H) 06/26/2018 0936   RDW 15.4 (H) 08/02/2017 0839   LYMPHSABS 1.8 10/02/2019 0734   LYMPHSABS 1.3 08/02/2017 0839   MONOABS 1.6 (H) 10/02/2019 0734   MONOABS 0.6 08/02/2017 0839   EOSABS 0.1 10/02/2019 0734   EOSABS 0.1 08/02/2017 0839   BASOSABS 0.1 10/02/2019 0734   BASOSABS 0.0 08/02/2017 0839   CMP     Component Value Date/Time   NA 136 10/02/2019 0734   NA 137 06/26/2018 0936   NA 136 08/03/2016 1006   K 4.2 10/02/2019 0734   K 5.0 08/03/2016 1006   CL 95 (L) 10/02/2019 0734   CL 101 08/16/2012 0757   CO2 24 10/02/2019 0734   CO2 20 (L) 08/03/2016 1006   GLUCOSE 109 (H) 10/02/2019 0734   GLUCOSE 141 (H) 08/03/2016 1006   GLUCOSE 142 (H) 08/16/2012 0757   BUN 49 (H) 10/02/2019 0734   BUN 31 (H) 06/26/2018 0936   BUN 44.9 (H) 08/03/2016 1006   CREATININE 5.03 (H) 10/02/2019 0734   CREATININE 3.6 (HH) 08/03/2016 1006   CALCIUM 8.6 (L) 10/02/2019 0734   CALCIUM 9.3 08/03/2016 1006   PROT 6.0 (L) 10/02/2019 0734   PROT 6.8 06/26/2018 0936   PROT 7.1 08/03/2016 1006   ALBUMIN 2.0 (L) 10/02/2019 0734   ALBUMIN 4.4 06/26/2018 0936   ALBUMIN 3.8 08/03/2016 1006   AST 21 10/02/2019 0734   AST 19 08/03/2016 1006   ALT 14 10/02/2019 0734   ALT 15 08/03/2016 1006   ALKPHOS 82 10/02/2019 0734   ALKPHOS 66 08/03/2016 1006   BILITOT 0.7 10/02/2019 0734   BILITOT 0.4 06/26/2018 0936   BILITOT  0.43 08/03/2016 1006   GFRNONAA 8 (L) 10/02/2019 0734   GFRAA 9 (L) 10/02/2019 0734    Lipid Panel     Component Value Date/Time   CHOL 59 07/23/2019 0420   CHOL 110 06/26/2018 0936   TRIG 74 07/23/2019 0420   HDL 28 (L) 07/23/2019 0420   HDL 59 06/26/2018 0936   CHOLHDL 2.1 07/23/2019 0420   VLDL 15 07/23/2019 0420   LDLCALC 16 07/23/2019 0420   LDLCALC 26 06/26/2018 0936   LDLDIRECT 34.4 11/17/2008 1229     Imaging I have reviewed the images obtained: CT-scan of the brain-recent and potentially acute infarct in the superior left frontal lobe with involvement of the left frontoparietal junction near the vertex as well.  Underlying atrophy with periventricular small vessel disease.  Prior infarct in the basal ganglia region, left thalamus and right insular cortex region noted  MRI examination of the brain-large late subacute left ACA territory infarct which has increased in extent as compared to MRI on 07/23/2019, although without evidence of superimposed acute infarct on the current exam.  Chronic petechial hemorrhage now present at this site.  Multiple punctate acute or early subacute infarcts within bilateral cerebral hemispheres, corpus callosum and left cerebellum.  Given involvement of multiple vascular territories, correlate for a embolic phenomenon.Etta Quill PA-C [facility] [phone]  M-F  (9:00 am- 5:00 PM)  10/03/2019, 10:16 AM   ATTENDING ADDENDUM Seen and examined. Imaging reviewed. Agree with H&P above  Assessment:  79 year old female past history of a stroke in Dec 2020, residual right hemiparesis,  presenting to the hospital with confusion.  MRI results showed increased extent of left  ACA territory infarct along with bilateral punctate infarcts.  Likely cardioembolic from the low EF.  Patient most likely will need to be on a anticoagulant long term.   Impression: -Extension of previous left ACA infarct -Bilateral punctate infarcts likely  cardioembolic  Recommend -Repeat echo - cardiology consulted for multivessel CAD and do not deem her to be candidate for PCI. Not a candidate for internsive cardiac workup and they agreed with supportive care and palliation. -Continue aspirin- will likely need to be placed on anticoagulant given her low EF and now bilateral infarcts -not sure where the family is at this point in terms of making any care limitations but as long as she is full scope of care, anticoagulation should be considered in about a week after repeating CTH to ensure no hemorrhagic transformation. -Atorvastatin 80 mg/other high intensity statin -start normalizing BPs -Telemetry monitoring -Frequent neuro checks -NPO until passes stroke swallow screen -PT/OT  Will not repeat whole stroke w/u. Only echo to ensure no LV thrombus. # please page stroke NP  Or  PA  Or MD from 8am -4 pm  as this patient from this time will be  followed by the stroke.   You can look them up on [email or web address]  Password [email or web address]   Stroke team to follow.  -- Amie Portland, MD [facility] Pager: [phone] If 7pm to 7am, please call on call as listed on AMION.

## 2019-10-03 NOTE — Evaluation (Signed)
Physical Therapy Evaluation Patient Details Name: [name] MRN: [id number] DOB: 12-25-1940 Today's Date: 10/03/2019   History of Present Illness   [name] is a 79 y.o. female with a history of CVA, CAD s/p CABG, chronic diastolic CHF, chronic anemia, HTN, DM, HLD, ESRD on HD, stroke with right hemiparesis who is being admitted with acute CVA. MRI with multiple punctuate acute/early subacute infarcts within the bilatearl cerebral hemispheres, corpus callosum, and left cerebellum.   Clinical Impression  Prior to admission, pt lives with her husband and is dependent for ADL's. She has been working on standing transfers with HHPT but is otherwise bed bound. Currently, pt denying change from baseline. Pt A&Ox3, follows all one step commands. Displays right hemiparesis, poor sitting balance, abnormal posture. Requiring two person maximal assist for progressing to sitting up on edge of bed. HR 122-125 bpm, BP 106/79. Since pt presents close to functional baseline, recommend d/c home with continued HHPT.     Follow Up Recommendations Home health PT;Supervision/Assistance - 24 hour    Equipment Recommendations  None recommended by PT (has all DME)   Recommendations for Other Services       Precautions / Restrictions Precautions Precautions: Fall;Other (comment) Precaution Comments: R hemiparesis Restrictions Weight Bearing Restrictions: No      Mobility  Bed Mobility Overal bed mobility: Needs Assistance Bed Mobility: Rolling;Sit to Supine;Sit to Sidelying Rolling: Total assist     Sit to supine: Max assist;+2 for physical assistance Sit to sidelying: Max assist;+2 for physical assistance    Transfers                    Ambulation/Gait                Stairs            Wheelchair Mobility    Modified Rankin (Stroke Patients Only)       Balance Overall balance assessment: Needs assistance Sitting-balance support: Feet supported Sitting  balance-Leahy Scale: Poor Sitting balance - Comments: Pt requiring up to maxA, demonstrates posterior and right lateral lean. Poor cervical/trunk control with manual facilitation provided to achieve extension                                     Pertinent Vitals/Pain Pain Assessment: Faces Faces Pain Scale: Hurts a little bit Pain Location: bottom Pain Descriptors / Indicators: Aching Pain Intervention(s): Limited activity within patient's tolerance;Monitored during session;Repositioned    Home Living Family/patient expects to be discharged to:: Private residence Living Arrangements: Spouse/significant other Available Help at Discharge: Family;Available 24 hours/day Type of Home: House Home Access: Ramped entrance     Home Layout: One level Home Equipment: Walker - 4 wheels;Toilet riser;Other (comment);Hospital bed;Wheelchair - manual(hoyer lift, manual TIS w/c)      Prior Function Level of Independence: Needs assistance   Gait / Transfers Assistance Needed: Working on standing to walker with HHPT, otherwise states she is bed bound  ADL's / Homemaking Assistance Needed: PCA 5 days/week typically bathes her in the bed  Comments: PCA 5 days/week     Hand Dominance   Dominant Hand: Right    Extremity/Trunk Assessment   Upper Extremity Assessment Upper Extremity Assessment: Defer to OT evaluation RUE Deficits / Details: gross grasp, no active shoulder, elbow, or wrist movement RUE Sensation: decreased light touch;decreased proprioception RUE Coordination: decreased fine motor;decreased gross motor    Lower Extremity  Assessment Lower Extremity Assessment: RLE deficits/detail;LLE deficits/detail RLE Deficits / Details: No active movement noted, PROM WFL LLE Deficits / Details: Grossly 2/5    Cervical / Trunk Assessment Cervical / Trunk Assessment: Kyphotic;Other exceptions Cervical / Trunk Exceptions: forward head posture  Communication   Communication:  HOH  Cognition Arousal/Alertness: Awake/alert Behavior During Therapy: WFL for tasks assessed/performed Overall Cognitive Status: Impaired/Different from baseline Area of Impairment: Problem solving                             Problem Solving: Slow processing;Decreased initiation;Requires verbal cues;Requires tactile cues General Comments: Likely close to baseline; A&Ox4, following commands      General Comments      Exercises     Assessment/Plan    PT Assessment Patient needs continued PT services  PT Problem List Decreased strength;Decreased range of motion;Decreased activity tolerance;Decreased balance;Decreased mobility;Impaired sensation       PT Treatment Interventions Functional mobility training;Therapeutic activities;Therapeutic exercise;Balance training;Patient/family education    PT Goals (Current goals can be found in the Care Plan section)  Acute Rehab PT Goals Patient Stated Goal: go home PT Goal Formulation: With patient Time For Goal Achievement: 10/17/19 Potential to Achieve Goals: Fair    Frequency Min 3X/week   Barriers to discharge        Co-evaluation PT/OT/SLP Co-Evaluation/Treatment: Yes Reason for Co-Treatment: Complexity of the patient's impairments (multi-system involvement);For patient/therapist safety;To address functional/ADL transfers PT goals addressed during session: Mobility/safety with mobility         AM-PAC PT "6 Clicks" Mobility  Outcome Measure Help needed turning from your back to your side while in a flat bed without using bedrails?: Total Help needed moving from lying on your back to sitting on the side of a flat bed without using bedrails?: Total Help needed moving to and from a bed to a chair (including a wheelchair)?: Total Help needed standing up from a chair using your arms (e.g., wheelchair or bedside chair)?: Total Help needed to walk in hospital room?: Total Help needed climbing 3-5 steps with a  railing? : Total 6 Click Score: 6    End of Session   Activity Tolerance: Patient tolerated treatment well(VSS) Patient left: in bed;with call bell/phone within reach Nurse Communication: Mobility status PT Visit Diagnosis: Other abnormalities of gait and mobility (R26.89);Hemiplegia and hemiparesis;Other symptoms and signs involving the nervous system (R29.898) Hemiplegia - Right/Left: Right Hemiplegia - dominant/non-dominant: Dominant Hemiplegia - caused by: Cerebral infarction    Time: [id number] PT Time Calculation (min) (ACUTE ONLY): 29 min   Charges:   PT Evaluation $PT Eval Moderate Complexity: 1 Mod            Wyona Almas, PT, DPT Acute Rehabilitation Services Pager [phone] Office [phone]   Deno Etienne 10/03/2019, 11:39 AM

## 2019-10-03 NOTE — Evaluation (Signed)
Speech Language Pathology Evaluation Patient Details Name: [name] MRN: [id number] DOB: [birth year] Today's Date: 10/03/2019 Time: [id number] SLP Time Calculation (min) (ACUTE ONLY): 30 min  Problem List:  Patient Active Problem List   Diagnosis Date Noted  . Palliative care by specialist   . Goals of care, counseling/discussion   . DNR (do not resuscitate)   . Lethargy   . ESRD on dialysis ([place])   . Controlled type 2 diabetes mellitus with hyperglycemia, without long-term current use of insulin ([place])   . Labile blood glucose   . Chronic cystitis   . Diarrhea due to malabsorption   . Small vessel cerebrovascular accident (CVA) ([place]) 07/27/2019  . Stage I pressure ulcer of sacral region 07/25/2019  . TIA (transient ischemic attack) 07/23/2019  . CVA (cerebral vascular accident) ([place]) 07/23/2019  . Volume overload 05/27/2018  . Dyspnea 05/27/2018  . Acute respiratory failure with hypoxia ([place]) 05/27/2018  . Acute respiratory distress 05/27/2018  . Acute encephalopathy 05/27/2018  . Aspiration pneumonia ([place]) 05/27/2018  . Pressure ulcer 05/19/2018  . Acute on chronic combined systolic and diastolic heart failure ([place]) 05/17/2018  . LBBB (left bundle branch block) 05/17/2018  . Aortic stenosis, moderate 05/17/2018  . NSTEMI (non-ST elevated myocardial infarction) ([place]) 05/16/2018  . Symptomatic anemia 05/15/2018  . Demand ischemia ([place]) 05/15/2018  . Protein-calorie malnutrition, severe 04/20/2018  . Pressure injury of skin 04/18/2018  . Lower GI bleeding 10/31/2016  . Blood loss anemia 10/31/2016  . AKI (acute kidney injury) ([place]) 10/31/2016  . Stroke ([place]) 09/26/2016  . ESRD (end stage renal disease) on dialysis ([place]) 06/23/2015  . Balance problem 12/22/2013  . Edema 12/28/2011  . CLAUDICATION 11/01/2010  . CAROTID ARTERY STENOSIS 10/29/2009  . AORTIC STENOSIS 09/17/2009  . Essential hypertension 11/17/2008  . DIASTOLIC HEART FAILURE, CHRONIC 11/17/2008  . Diabetes  mellitus type II, non insulin dependent ([place]) 11/12/2008  . Dyslipidemia 11/12/2008  . Anemia, chronic renal failure 11/12/2008  . Thrombocytopenia ([place]) 11/12/2008  . Anxiety state 11/12/2008  . Coronary atherosclerosis 11/12/2008  . UNSPEC COMBINED SYSTOLIC&DIASTOLIC HEART FAILURE 0000000  . ESOPHAGEAL REFLUX 11/12/2008  . Chronic kidney disease (CKD), stage V ([place]) 11/12/2008  . LOW BACK PAIN, CHRONIC 11/12/2008  . INSOMNIA UNSPECIFIED 11/12/2008   Past Medical History:  Past Medical History:  Diagnosis Date  . Anemia    Pt is taking iron.   [name] Anxiety   . Arthritis   . Carotid stenosis    40-59% bilateral ICA stenosis in 2/12.  . Chronic low back pain   . CKD (chronic kidney disease)    Dr. Audie Clear at [facility]  . Coronary artery disease    Pt presented 2/10 to [facility] with NSTEMI and diastolic CHF exacerbation.  LHC was done  3/10 showing 99% pRCA stenosis and 80% calcified pLAD stenosis with L=>R collaterals.  Pt was referred  for CABG which was done by Dr. Prescott Gum with LIMA-LAD, SVG-RCA, SVG-OM.  . Diabetes mellitus   . Diabetic neuropathy ([place])   . Diastolic CHF (HCC)    Echo (2/10) showed EF 55-65%, mild LVH, diastolic dysfunction, mild AS with mean gradient 12 mmHg, PASP 43 mmHg.  Echo (2/12): EF 55-60%, mild LVH, mild AS (mean gradient 12), PA systolic pressure 32 mmHg.     [name] GERD (gastroesophageal reflux disease)   . Heart murmur   . Hyperlipidemia   . Hypertension   . Mild aortic stenosis    mean gradient 12 mmHg in 2/12.  [name]  Myocardial infarction ([place])    "mild"  . Pneumonia   . PONV (postoperative nausea and vomiting)   . Stroke [facility])    " mild"  . Thrombocytopenia ([place])   . Unsteady gait    Past Surgical History:  Past Surgical History:  Procedure Laterality Date  . AORTIC ARCH ANGIOGRAPHY N/A 10/18/2018   Procedure: AORTIC ARCH ANGIOGRAPHY;  Surgeon: Marty Heck, MD;  Location: [place] CV LAB;  Service: Cardiovascular;   Laterality: N/A;  . AV FISTULA PLACEMENT Left 01/30/2015   Procedure: Creation of a Radial Cephalic AV Fistula left wrist;  Surgeon: [name], MD;  Location: [place];  Service: Vascular;  Laterality: Left;  . AV FISTULA PLACEMENT Left 09/06/2018   Procedure: LEFT ARM ARTERIOVENOUS (AV) FISTULA CREATION;  Surgeon: Marty Heck, MD;  Location: [place];  Service: Vascular;  Laterality: Left;  . BACK SURGERY     multiple  . BREAST SURGERY     biopsy  . BUBBLE STUDY  08/02/2019   Procedure: BUBBLE STUDY;  Surgeon: Skeet Latch, MD;  Location: [place];  Service: Cardiovascular;;  . CARDIAC CATHETERIZATION    . CATARACT EXTRACTION W/ INTRAOCULAR LENS  IMPLANT, BILATERAL    . COLONOSCOPY Left 11/03/2016   Procedure: COLONOSCOPY;  Surgeon: [name], MD;  Location: [facility] ENDOSCOPY;  Service: Endoscopy;  Laterality: Left;  . COLONOSCOPY W/ BIOPSIES AND POLYPECTOMY    . CORONARY ARTERY BYPASS GRAFT  09/2008   pt with NSTEMI and diastolic CHF exacerbation.  LHC was done  3/10 showing 99% pRCA stenosis and 80% calcified pLAD stenosis with L=>R collaterals.  Pt was referred  for CABG which was done by Dr. Prescott Gum with LIMA-LAD, SVG-RCA, SVG-OM.  [name] ESOPHAGOGASTRODUODENOSCOPY N/A 11/02/2016   Procedure: ESOPHAGOGASTRODUODENOSCOPY (EGD);  Surgeon: [name], MD;  Location: [facility] ENDOSCOPY;  Service: Endoscopy;  Laterality: N/A;  . GIVENS CAPSULE STUDY N/A 11/29/2016   Procedure: GIVENS CAPSULE STUDY;  Surgeon: [name], MD;  Location: [place];  Service: Endoscopy;  Laterality: N/A;  . LIGATION OF ARTERIOVENOUS  FISTULA Left 09/13/2018   Procedure: LIGATION OF LEFT ARTERIOVENOUS  FISTULA;  Surgeon: Waynetta Sandy, MD;  Location: [place];  Service: Vascular;  Laterality: Left;  . REVISON OF ARTERIOVENOUS FISTULA Left 07/02/2015   Procedure: REVISON OF LEFT RADIOCEPHALIC ARTERIOVENOUS FISTULA;  Surgeon: [name], MD;  Location: [place];  Service: Vascular;  Laterality: Left;   . RIGHT/LEFT HEART CATH AND CORONARY/GRAFT ANGIOGRAPHY N/A 05/18/2018   Procedure: RIGHT/LEFT HEART CATH AND CORONARY/GRAFT ANGIOGRAPHY;  Surgeon: Leonie Man, MD;  Location: [place] CV LAB;  Service: Cardiovascular;  Laterality: N/A;  . TEE WITHOUT CARDIOVERSION N/A 08/02/2019   Procedure: TRANSESOPHAGEAL ECHOCARDIOGRAM (TEE);  Surgeon: Skeet Latch, MD;  Location: [place];  Service: Cardiovascular;  Laterality: N/A;  . UPPER EXTREMITY ANGIOGRAPHY Left 10/18/2018   Procedure: UPPER EXTREMITY ANGIOGRAPHY;  Surgeon: Marty Heck, MD;  Location: [place] CV [facility];  Service: Cardiovascular;  Laterality: Left;   HPI:  79 yo female adm to [facility] with confusion.  Pt found to have right sided weakness and found to have large subacute ACA CVA impacting left frontal and left parietal lobes, left cerebellum and bilateral cerebral hemisphere, corpus collasum cva.  Pt with h/o carotid stenosis, DM, arthritis, heart murmur, CHF, GERD, ESRD on HD.  Pt found to have a critical troponin.  Swallow and speech evaluation ordered. Pt CXR negative for acute event.   Assessment / Plan / Recommendation Clinical Impression  Speech and language/cognitive evaluation revealed pt with mild memory deficits resulting in lack of recall of information shared during session *prospective memory.  With moderate verbal cueing pt able to verbalize information that was important to her *phone w/i reach to speak to husband.  She demonstrates deficits in facial nerve involvement resutling in decreased labial seal on her left.  No dysarthria or aphasia apparent and pt able to speak in complete sentences.  Pt was oriented x4, independently verbalized need to look to the right due to inattention and able to verbalize her needs/desires.  SLP will follow up for diagnostic/treatment to assure pt's abilities are close to baseline to decrease caregiver burden.    SLP Assessment  SLP Recommendation/Assessment: Patient needs  continued Speech Lanaguage Pathology Services SLP Visit Diagnosis: Dysphagia, oropharyngeal phase (R13.12);Cognitive communication deficit (R41.841)    Follow Up Recommendations  Skilled Nursing facility    Frequency and Duration min 2x/week  2 weeks      SLP Evaluation Cognition  Overall Cognitive Status: No family/caregiver present to determine baseline cognitive functioning Arousal/Alertness: Awake/alert Orientation Level: Oriented to situation;Oriented to time;Oriented to place;Oriented to person Attention: Sustained Sustained Attention: Appears intact Memory: Impaired Memory Impairment: Storage deficit;Other (comment)(pt did not recall that spouse call her during session and SLP needed to leave phone for her to use when he returned call) Problem Solving: Impaired Problem Solving Impairment: Functional complex(pt admitted "I still can't see well on my right, I had to look to find my water")       Comprehension  Auditory Comprehension Overall Auditory Comprehension: Appears within functional limits for tasks assessed Yes/No Questions: Not tested Commands: Within Functional Limits(WFL for up to 2 step commands, will assess further in dx/tx) Interfering Components: Hearing EffectiveTechniques: Increased volume;Repetition Visual Recognition/Discrimination Discrimination: Not tested Reading Comprehension Reading Status: Within funtional limits(pt able to read swallow precaution sign with comprehension)    Expression Expression Primary Mode of Expression: Verbal Verbal Expression Overall Verbal Expression: Appears within functional limits for tasks assessed Initiation: No impairment Level of Generative/Spontaneous Verbalization: Sentence Repetition: (dNT) Naming: No impairment Pragmatics: No impairment Written Expression Dominant Hand: Right Written Expression: Not tested(pt with right hemiparesis)   Oral / Motor  Oral Motor/Sensory Function Overall Oral Motor/Sensory  Function: Mild impairment Facial ROM: Reduced left;Suspected CN VII (facial) dysfunction Facial Symmetry: Suspected CN VII (facial) dysfunction Facial Strength: Reduced left Lingual ROM: Within Functional Limits Lingual Symmetry: Within Functional Limits Lingual Strength: Within Functional Limits Lingual Sensation: Within Functional Limits Velum: Within Functional Limits Mandible: Impaired Motor Speech Overall Motor Speech: Appears within functional limits for tasks assessed Respiration: Within functional limits Phonation: Low vocal intensity(pt reports voice to be at baseline strength) Resonance: Within functional limits Articulation: Within functional limitis Intelligibility: Intelligible Motor Planning: Witnin functional limits Motor Speech Errors: Not applicable   GO                    [name] 10/03/2019, 2:44 PM   Kathleen Lime, MS [place] Office [phone]

## 2019-10-03 NOTE — Significant Event (Addendum)
Rapid Response Event Note  Overview: Called d/t vtach with brief period of unresponsiveness. Per RN, pt went into vtach at 1927. RN went in and pt was unresponsive and slumped over in bed. RN sternal rubbed the pt and pt began to respond. Pt is a DNR. RRT called as a second set of eyes.  Time Called: 1945 Arrival Time: 1954 Event Type: Neurologic, Cardiac  Initial Focused Assessment: Pt laying in bed with eyes opened, alert and oriented, follows commands. Pt denies chest pain, SOB, and dizziness/lightheadness. T-99.7, HR-130(ST), BP-100/70, RR-20, SpO2-99% on RA. Skin warm and dry. EKG strip shows nonsustained vtach t/o day.  Interventions: EKG-ST with PVCs BMP, Mg>1.5-4g Mg ordered 250cc NS bolus over 30 minutes   Plan of Care (if not transferred): Pt is a DNR. Give NS bolus, replete Mg. Continue to monitor pt.  Call RRT if further assistance needed.  Event Summary: Name of Physician Notified: Louretta Parma, NP at (PTA RRT)    at    Outcome: Stayed in room and stabalized     [place], Carren Rang

## 2019-10-03 NOTE — Consult Note (Signed)
Consultation Note Date: 10/03/2019   Patient Name: [name]  DOB: 1940-08-23  MRN: [id number]  Age / Sex: 79 y.o., female  PCP: [name], MD Referring Physician: Karie Kirks, DO  Reason for Consultation: Establishing goals of care and Psychosocial/spiritual support  HPI/Patient Profile: 79 y.o. 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  She was hypoglycemia and hypotensive.  There is concern about her ability to tolerate effective hemodialysis safely.  Clinical Assessment and Goals of Care:  I have reviewed medical records including EPIC notes, labs and imaging, received report from the bedside RN, Dr. Benny Lennert and Dr. Hollie Salk, and spoke on the phone with Priscille Heidelberg the patient's son and HCPOA  to discuss diagnosis prognosis, GOC, EOL wishes, disposition and options.  I introduced Palliative Medicine as specialized medical care for people living with serious illness. It focuses on providing relief from the symptoms and stress of a serious illness.    We discussed a brief life review of the patient. Mrs. Rorick has always been very independent.  She lives at home with her husband and is unable to use her right side.  She as a poodle she loves named [name] and has been married to [place] over 17 years.  I talked with Darol Destine about the new strokes.  Darol Destine is quite knowledgeable about his mother's condition. We talked about HD.  Darol Destine states that if his mother even misses 1 treatment she has difficulty with heart failure.  We talked about concerns of HD lowering her BP and causing more strokes or having to have such gentle dialysis that it is not effective.    We agreed to reassess the  situation in person tomorrow at patient's bedside with Mr. Brann as well.  Questions and concerns were addressed.  The family was encouraged to call with questions or concerns.    Primary Decision Maker:  PATIENT.   SON SHAE IS PATIENT'S HCPOA.    SUMMARY OF RECOMMENDATIONS    PMT meeting on 2/19 at 12:00 with patient, son and husband.  Code Status/Advance Care Planning:  DNR   Symptom Management:   Per primary  Additional Recommendations (Limitations, Scope, Preferences):  Full Scope Treatment  Palliative Prophylaxis:   Aspiration precautions  Psycho-social/Spiritual:   Desire for further Chaplaincy support: not discussed.  Prognosis:  To be determined.    Discharge Planning: To Be Determined      Primary Diagnoses: Present on Admission: . CVA (cerebral vascular accident) ([place]) . Controlled type 2 diabetes mellitus with hyperglycemia, without long-term current use of insulin ([place]) . DIASTOLIC HEART FAILURE, CHRONIC . DNR (do not resuscitate) . Essential hypertension . Dyslipidemia   I have reviewed the medical record, interviewed the patient and family, and examined the patient. The following aspects are pertinent.  Past Medical History:  Diagnosis Date  . Anemia    Pt is taking iron.   [name] Anxiety   . Arthritis   . Carotid stenosis    40-59% bilateral ICA stenosis in 2/12.  . Chronic low back pain   . CKD (chronic kidney disease)    Dr. Audie Clear at [facility]  . Coronary artery disease    Pt presented 2/10 to [facility] with NSTEMI and diastolic CHF exacerbation.  LHC was done  3/10 showing 99% pRCA stenosis and 80% calcified pLAD stenosis with L=>R collaterals.  Pt was referred  for CABG which was done by Dr. Prescott Gum with LIMA-LAD, SVG-RCA, SVG-OM.  . Diabetes mellitus   . Diabetic neuropathy ([place])   . Diastolic CHF (HCC)    Echo (2/10) showed EF 55-65%, mild LVH, diastolic dysfunction, mild AS with mean gradient 12 mmHg, PASP 43 mmHg.   Echo (2/12): EF 55-60%, mild LVH, mild AS (mean gradient 12), PA systolic pressure 32 mmHg.     [name] GERD (gastroesophageal reflux disease)   . Heart murmur   . Hyperlipidemia   . Hypertension   . Mild aortic stenosis    mean gradient 12 mmHg in 2/12.  . Myocardial infarction ([place])    "mild"  . Pneumonia   . PONV (postoperative nausea and vomiting)   . Stroke [facility])    " mild"  . Thrombocytopenia ([place])   . Unsteady gait    Social History   Socioeconomic History  . Marital status: Married    Spouse name: Linton Ham  . Number of children: 1  . Years of education: Not on file  . Highest education level: Not on file  Occupational History  . Occupation: retired from office work  Tobacco Use  . Smoking status: Former Smoker    Types: Cigarettes  . Smokeless tobacco: Never Used  . Tobacco comment: quit 1988  Substance and Sexual Activity  . Alcohol use: Yes    Comment: daily  . Drug use: No  . Sexual activity: Not Currently    Partners: Male  Other Topics Concern  . Not on file  Social History Narrative  . Not on file   Social Determinants of Health   Financial Resource Strain:   . Difficulty of Paying Living Expenses: Not on file  Food Insecurity:   . Worried About Charity fundraiser in the Last Year: Not on file  . Ran Out of Food in the Last Year: Not on file  Transportation Needs:   . Lack of Transportation (Medical): Not on file  . Lack of Transportation (Non-Medical): Not on file  Physical Activity:   . Days of Exercise per Week: Not on file  . Minutes of Exercise per Session: Not on file  Stress:   . Feeling of Stress : Not on file  Social Connections:   . Frequency of Communication with Friends and Family: Not on file  . Frequency of Social Gatherings with Friends and Family: Not on file  . Attends Religious Services: Not on file  . Active Member of Clubs or Organizations: Not on file  . Attends Archivist Meetings: Not on file  . Marital Status:  Not on file   Family History  Adopted: Yes  Family history unknown: Yes   Scheduled Meds: . heparin      .  stroke: mapping our early stages of recovery book   Does not apply Once  . aspirin EC  81 mg Oral Daily  . Chlorhexidine Gluconate  Cloth  6 each Topical Daily  . Chlorhexidine Gluconate Cloth  6 each Topical Q0600  . feeding supplement (NEPRO CARB STEADY)  237 mL Oral TID BM  . gabapentin  100 mg Oral QHS  . heparin  5,000 Units Subcutaneous Q8H  . insulin aspart  0-6 Units Subcutaneous Q4H  . multivitamin  1 tablet Oral QHS  . pantoprazole  40 mg Oral BID  . rosuvastatin  20 mg Oral q1800  . sevelamer carbonate  1,600 mg Oral TID WC   Continuous Infusions: . sodium chloride 100 mL (10/02/19 1911)  . sodium chloride    . sodium chloride    . dextrose 50 mL/hr at 10/03/19 0018   PRN Meds:.sodium chloride, sodium chloride, sodium chloride, acetaminophen **OR** acetaminophen (TYLENOL) oral liquid 160 mg/5 mL **OR** acetaminophen, alteplase, calcium carbonate (dosed in mg elemental calcium), camphor-menthol **AND** hydrOXYzine, docusate sodium, lidocaine (PF), lidocaine-prilocaine, ondansetron **OR** ondansetron (ZOFRAN) IV, pentafluoroprop-tetrafluoroeth, senna-docusate, sorbitol Allergies  Allergen Reactions  . Doxycycline Nausea And Vomiting    Caused "DEATHLY NAUSEA AND VOMITING"  . Lipitor [Atorvastatin] Other (See Comments)    Stomach pain  . Strawberry Extract Rash      Vital Signs: BP 117/60 (BP Location: Left Arm)   Pulse (!) 121   Temp 99.1 F (37.3 C) (Oral)   Resp (!) 22   Ht 5\' 8"  (1.727 m)   Wt 52.7 kg   SpO2 95%   BMI 17.67 kg/m  Pain Scale: 0-10 POSS *See Group Information*: 1-Acceptable,Awake and alert Pain Score: 0-No pain   SpO2: SpO2: 95 % O2 Device:SpO2: 95 % O2 Flow Rate: .   IO: Intake/output summary:   Intake/Output Summary (Last 24 hours) at 10/03/2019 1635 Last data filed at 10/03/2019 0300 Gross per 24 hour  Intake 250 ml    Output --  Net 250 ml    LBM: Last BM Date: 10/02/19 Baseline Weight: Weight: 51.6 kg Most recent weight: Weight: 52.7 kg     Palliative Assessment/Data: 20%     Time In: 4:00 Time Out: 4:50 Time Total: 50 min. Visit consisted of counseling and education dealing with the complex and emotionally intense issues surrounding the need for palliative care and symptom management in the setting of serious and potentially life-threatening illness. Greater than 50%  of this time was spent counseling and coordinating care related to the above assessment and plan.  Signed by: Florentina Jenny, PA-C Palliative Medicine  Please contact Palliative Medicine Team phone at [phone] for questions and concerns.  For individual provider: See Shea Evans

## 2019-10-03 NOTE — Progress Notes (Addendum)
Dr. Rory Percy informed of pt's VT and unresponsiveness at 1930 noted by myself, Baxter Flattery, RN, and Davy Pique, RNBeaulah Corin, NP updated as well. Pt regained responsiveness at 1932. Per telemetry pt was in VT at 1927 and RR and hospitalist updated. RR and [place], NP at bedside post pt regaining responsiveness. Interventions in place. Pt's son updated at 2139.

## 2019-10-03 NOTE — TOC Progression Note (Signed)
Transition of Care [facility]) - Progression Note    Patient Details  Name: [name] MRN: [id number] Date of Birth: Oct 26, 1940  Transition of Care [facility]) CM/SW [place], [place] Work Phone Number: 10/03/2019, 2:44 PM  Clinical Narrative:     MSW Intern spoke with pt's husband who confirmed that the pt is setup with [facility], and they plan on continuing services. [facility] also confirmed that she has a Marine scientist, PT and OT. She said they are ready to resume upon discharge. SW will continue to follow.       Expected Discharge Plan and Services                                                 Social Determinants of Health (SDOH) Interventions    Readmission Risk Interventions No flowsheet data found.

## 2019-10-03 NOTE — Progress Notes (Signed)
Initial Nutrition Assessment  RD working remotely.   DOCUMENTATION CODES:   Underweight  INTERVENTION:   Nepro Shake po TID, each supplement provides 425 kcal and 19 grams protein (serve chilled)  Continue renal MVI   NUTRITION DIAGNOSIS:   Increased nutrient needs related to chronic illness(ESRD on HD) as evidenced by estimated needs.   GOAL:   Patient will meet greater than or equal to 90% of their needs   MONITOR:   Diet advancement, Supplement acceptance, PO intake, Weight trends, I & O's, Labs  REASON FOR ASSESSMENT:   Consult Other (Comment)(CVA)  ASSESSMENT:   Pt with a PMH significant for CVA, CAD, HTN, HLD, DM, CHF, and ESRD on MWF HD presenting with AMS.  She was previously admitted from 12/8-12 with a CVA with right-sided hemiplegia and went to CIR through 1/9. Pt completed MOST form during previous admission and indicated that she prefers to be DNR with limited interventions but no ICU, no feeding tube, antibiotics and IVF are ok. Pt admitted with CVA.  2/18 SLP recommended DYS3/Nectar thick liquids   Unable to reach pt via phone.   Discussed pt with RN; discussed need to ensure all liquids are nectar thick per SLP recommendations.  Per nephrology, pt EDW is 51.5 kg.  No PO intake documented; pt was NPO until 1209 today.   Pt wt appears stable based on wts listed in chart.   Medications reviewed and include: SSI, Rena-vit, Renvela  Labs reviewed: Na 133 (L), BUN 62 (H), Cr 6.10 (H), Phosphorus 5.4 (H) CBGs 77-184  NUTRITION - FOCUSED PHYSICAL EXAM:  RD unable to perform at this time.   Diet Order:   Diet Order            DIET DYS 3 Room service appropriate? Yes; Fluid consistency: Nectar Thick  Diet effective now              EDUCATION NEEDS:   Not appropriate for education at this time  Skin:  Skin Assessment: Reviewed RN Assessment  Last BM:  2/17  Height:   Ht Readings from Last 1 Encounters:  10/02/19 5\' 8"  (1.727 m)     Weight:   Wt Readings from Last 1 Encounters:  10/02/19 52.5 kg    BMI:  Body mass index is 17.6 kg/m.  Estimated Nutritional Needs:   Kcal:  1500-1700  Protein:  80-95 grams  Fluid:  1070ml + UOP   Larkin Ina, MS, RD, LDN RD pager number and weekend/on-call pager number located in [place].

## 2019-10-03 NOTE — Progress Notes (Signed)
Nurse Daily Note  Patient was evaluated by speech, OT/PT (see service notes). Patient now has Dysphagia 3 diet, Necture thick liquids-Patient is able to fed herself after set-up. Primary nurse notified by tele during HD of ectopy/Vfib/Vtach. Primary nurse and staff nurse Pricilla Larsson went to HD unit to assess patient was alert, denies chest pain/pressure. HD RN at bedside also review telemetry rhythm at that time patient was in [place]. Tele tech upload strips to chart for team review. Patient had [place] all shift due to hypotension and tachycardia, Primary MD notified 0830 and vitals obtained per protocol. Primary RN spoke with multiple family members (son, husband and sister in law) during shift. Updates provided directly to son-Shay.

## 2019-10-03 NOTE — Evaluation (Signed)
Clinical/Bedside Swallow Evaluation Patient Details  Name: [name] MRN: [id number] Date of Birth: [birth year]  Today's Date: 10/03/2019 Time: SLP Start Time (ACUTE ONLY): 1035 SLP Stop Time (ACUTE ONLY): 1059 SLP Time Calculation (min) (ACUTE ONLY): 24 min  Past Medical History:  Past Medical History:  Diagnosis Date  . Anemia    Pt is taking iron.   [name] Anxiety   . Arthritis   . Carotid stenosis    40-59% bilateral ICA stenosis in 2/12.  . Chronic low back pain   . CKD (chronic kidney disease)    Dr. Audie Clear at [facility]  . Coronary artery disease    Pt presented 2/10 to [facility] with NSTEMI and diastolic CHF exacerbation.  LHC was done  3/10 showing 99% pRCA stenosis and 80% calcified pLAD stenosis with L=>R collaterals.  Pt was referred  for CABG which was done by Dr. Prescott Gum with LIMA-LAD, SVG-RCA, SVG-OM.  . Diabetes mellitus   . Diabetic neuropathy ([place])   . Diastolic CHF (HCC)    Echo (2/10) showed EF 55-65%, mild LVH, diastolic dysfunction, mild AS with mean gradient 12 mmHg, PASP 43 mmHg.  Echo (2/12): EF 55-60%, mild LVH, mild AS (mean gradient 12), PA systolic pressure 32 mmHg.     [name] GERD (gastroesophageal reflux disease)   . Heart murmur   . Hyperlipidemia   . Hypertension   . Mild aortic stenosis    mean gradient 12 mmHg in 2/12.  . Myocardial infarction ([place])    "mild"  . Pneumonia   . PONV (postoperative nausea and vomiting)   . Stroke [facility])    " mild"  . Thrombocytopenia ([place])   . Unsteady gait    Past Surgical History:  Past Surgical History:  Procedure Laterality Date  . AORTIC ARCH ANGIOGRAPHY N/A 10/18/2018   Procedure: AORTIC ARCH ANGIOGRAPHY;  Surgeon: Marty Heck, MD;  Location: [place] CV LAB;  Service: Cardiovascular;  Laterality: N/A;  . AV FISTULA PLACEMENT Left 01/30/2015   Procedure: Creation of a Radial Cephalic AV Fistula left wrist;  Surgeon: [name], MD;  Location: [place];  Service: Vascular;  Laterality:  Left;  . AV FISTULA PLACEMENT Left 09/06/2018   Procedure: LEFT ARM ARTERIOVENOUS (AV) FISTULA CREATION;  Surgeon: Marty Heck, MD;  Location: [place];  Service: Vascular;  Laterality: Left;  . BACK SURGERY     multiple  . BREAST SURGERY     biopsy  . BUBBLE STUDY  08/02/2019   Procedure: BUBBLE STUDY;  Surgeon: Skeet Latch, MD;  Location: [place];  Service: Cardiovascular;;  . CARDIAC CATHETERIZATION    . CATARACT EXTRACTION W/ INTRAOCULAR LENS  IMPLANT, BILATERAL    . COLONOSCOPY Left 11/03/2016   Procedure: COLONOSCOPY;  Surgeon: [name], MD;  Location: [facility] ENDOSCOPY;  Service: Endoscopy;  Laterality: Left;  . COLONOSCOPY W/ BIOPSIES AND POLYPECTOMY    . CORONARY ARTERY BYPASS GRAFT  09/2008   pt with NSTEMI and diastolic CHF exacerbation.  LHC was done  3/10 showing 99% pRCA stenosis and 80% calcified pLAD stenosis with L=>R collaterals.  Pt was referred  for CABG which was done by Dr. Prescott Gum with LIMA-LAD, SVG-RCA, SVG-OM.  [name] ESOPHAGOGASTRODUODENOSCOPY N/A 11/02/2016   Procedure: ESOPHAGOGASTRODUODENOSCOPY (EGD);  Surgeon: [name], MD;  Location: [facility] ENDOSCOPY;  Service: Endoscopy;  Laterality: N/A;  . GIVENS CAPSULE STUDY N/A 11/29/2016   Procedure: GIVENS CAPSULE STUDY;  Surgeon: [name], MD;  Location: [place];  Service: Endoscopy;  Laterality: N/A;  . LIGATION OF ARTERIOVENOUS  FISTULA Left 09/13/2018   Procedure: LIGATION OF LEFT ARTERIOVENOUS  FISTULA;  Surgeon: Waynetta Sandy, MD;  Location: [place];  Service: Vascular;  Laterality: Left;  . REVISON OF ARTERIOVENOUS FISTULA Left 07/02/2015   Procedure: REVISON OF LEFT RADIOCEPHALIC ARTERIOVENOUS FISTULA;  Surgeon: [name], MD;  Location: [place];  Service: Vascular;  Laterality: Left;  . RIGHT/LEFT HEART CATH AND CORONARY/GRAFT ANGIOGRAPHY N/A 05/18/2018   Procedure: RIGHT/LEFT HEART CATH AND CORONARY/GRAFT ANGIOGRAPHY;  Surgeon: Leonie Man, MD;  Location: [place] CV LAB;   Service: Cardiovascular;  Laterality: N/A;  . TEE WITHOUT CARDIOVERSION N/A 08/02/2019   Procedure: TRANSESOPHAGEAL ECHOCARDIOGRAM (TEE);  Surgeon: Skeet Latch, MD;  Location: [place];  Service: Cardiovascular;  Laterality: N/A;  . UPPER EXTREMITY ANGIOGRAPHY Left 10/18/2018   Procedure: UPPER EXTREMITY ANGIOGRAPHY;  Surgeon: Marty Heck, MD;  Location: [place] CV LAB;  Service: Cardiovascular;  Laterality: Left;   HPI:  79 yo female adm to [facility] with confusion.  Pt found to have right sided weakness and found to have large subacute CVA, left cerebellum and bilateral cerebral hemisphere, corpus collasum cva.  Pt with h/o carotid stenosis, DM, arthritis, heart murmur, CHF, GERD, ESRD on HD.  Pt found to have a critical troponin.  Swallow and speech evaluation ordered. Pt CXR negative for acute event.   Assessment / Plan / Recommendation Clinical Impression  Patient presents with clinical indications of mild oral-pharyngeal dysphagia. Decreased labial seal results in anterior spillage of liquids on left with decreased awareness.  Subtle throat clearing and cough noted after swallowing thin water via cup/straw - concerning for possible silent aspiration risk and given pt has a weak cough airway protection may be limited. Pt with no indication of aspiration with thin via tsp, nectar, puree or solids.  She does appear to concentrate on swallowing - to which she confirmed.  Recommend dys3/nectar diet and allow thin water between meals after mouth care.  Will follow up for tolerance and indication for MBS.  Pt educated using teach back and written precautions. SLP Visit Diagnosis: Dysphagia, oropharyngeal phase (R13.12)    Aspiration Risk  Mild aspiration risk    Diet Recommendation Dysphagia 3 (Mech soft);Nectar-thick liquid;Free water protocol after oral care   Liquid Administration via: Cup;Straw Medication Administration: Whole meds with puree Supervision: Patient able to self  feed Compensations: Small sips/bites;Slow rate Postural Changes: Seated upright at 90 degrees    Other  Recommendations Oral Care Recommendations: Oral care BID;Oral care prior to ice chip/H20   Follow up Recommendations Skilled Nursing facility      Frequency and Duration min 2x/week  2 weeks       Prognosis Prognosis for Safe Diet Advancement: Good      Swallow Study   General Date of Onset: 10/02/19 HPI: 79 yo female adm to [facility] with confusion.  Pt found to have right sided weakness and found to have large subacute CVA, left cerebellum and bilateral cerebral hemisphere, corpus collasum cva.  Pt with h/o carotid stenosis, DM, arthritis, heart murmur, CHF, GERD, ESRD on HD.  Pt found to have a critical troponin.  Swallow and speech evaluation ordered. Pt CXR negative for acute event. Type of Study: Bedside Swallow Evaluation Previous Swallow Assessment: 05/28/2018 penetration of thin and puree - silently- regular/thin with chin tuck advised Diet Prior to this Study: NPO Temperature Spikes Noted: No Respiratory Status: Room air Behavior/Cognition: Alert;Cooperative Oral Cavity Assessment: Dry Oral Care Completed  by SLP: Yes Oral Cavity - Dentition: Adequate natural dentition Vision: Functional for self-feeding(pt is right handed) Patient Positioning: Upright in bed Baseline Vocal Quality: Normal Volitional Cough: Weak Volitional Swallow: Able to elicit(after oral care able to elicit)    Oral/Motor/Sensory Function Overall Oral Motor/Sensory Function: Mild impairment Facial ROM: Reduced left;Suspected CN VII (facial) dysfunction Facial Symmetry: Suspected CN VII (facial) dysfunction Facial Strength: Reduced left Lingual ROM: Within Functional Limits Lingual Symmetry: Within Functional Limits Lingual Strength: Within Functional Limits Lingual Sensation: Within Functional Limits Velum: Within Functional Limits Mandible: Impaired   Ice Chips Ice chips: Within functional  limits   Thin Liquid Thin Liquid: Impaired Oral Phase Impairments: Reduced labial seal Oral Phase Functional Implications: Left lateral sulci pocketing Pharyngeal  Phase Impairments: Multiple swallows;Cough - Delayed;Throat Clearing - Immediate;Throat Clearing - Delayed Other Comments: tsps of thin tolerated without subtle indications of aspiration    Nectar Thick Nectar Thick Liquid: Within functional limits Presentation: Cup;Self Fed;Straw Other Comments: multiple swallows   Honey Thick Honey Thick Liquid: Not tested   Puree Puree: Within functional limits Presentation: Self Fed;Spoon   Solid     Solid: Within functional limits Presentation: Self Fed Oral Phase Functional Implications: Oral residue Other Comments: trace oral retention that pt cleared with liquid swallow      [name] 10/03/2019,12:08 PM  Kathleen Lime, MS [place] Office [phone]

## 2019-10-03 NOTE — Progress Notes (Signed)
Late entry 1720 Primary RN Angie Fava at bedside to confirm NST.  Patient had P wave on monitor.. Patient alert and oriented,  VS 100/60, HR 127, SATS ON ROOM AIR 93-95, Patient completed HD treatment at 1646 with no issues or complaints, 1.5 liters removed. Angie Fava, RN was present to tranfer back to room.

## 2019-10-03 NOTE — Progress Notes (Signed)
PROGRESS NOTE  [name] [id number] DOB: 1941/08/02 DOA: 10/02/2019 PCP: [name], MD  Brief History   [name] is a 79 y.o. female with medical history significant of CVA; CAD; HTN; HLD; DM; chronic diastolic CHF; and ESRD on MWF HD presenting with AMS.  She was previously admitted from 12/8-12 with a CVA with right-sided hemiplegia and went to CIR through 1/9.  Palliative care was involved during her stay and they completed a MOST form.  The patient is uncertain why she is here and is oriented x 1.  Her husband reports that she returned home with her husband - she has good days and bad.  She is completely unable to use her right side.  Today, he wasn't able to get her to respond.  She was unable to hold her coffee cup.  Her husband would like for her to be aggressively evaluated and treated for stroke once again, although she does have DNR paperwork and so will not be a full code based on her previously-stated wishes.  MOST form from 08/23/19, she prefers to be DNR; with limited interventions but no ICU; no feeding tube; antibiotics and IVF are ok.   Recent hospitalization with CVA, R-hemiplegia.  Not waking up well today - ?Xanax.  Abnormal CT, MRI with new over old strokes.  Neurology will see.  She is due for HD today.  [facility] were consulted to admit the patient for further evaluation and treatment.  The patient was admitted to a telemetry bed. Neurology has been consulted as had cardiology and nephrology.  Consultants  . Neurology . Nephrology . Cardiology  Procedures  . None  Antibiotics   Anti-infectives (From admission, onward)   None    .  Subjective  The patient is resting comfortably. No new complaints.  Objective   Vitals:  Vitals:   10/03/19 1530 10/03/19 1600  BP: 106/69 117/60  Pulse: (!) 121 (!) 121  Resp:    Temp:    SpO2:  95%   Exam:  Constitutional:  . The patient is awake, alert, and oriented x 3. No acute  distress. Respiratory:  . No increased work of breathing. . No wheezes, rales, or rhonchi . No tactile fremitus Cardiovascular:  . Regular rate and rhythm . No murmurs, ectopy, or gallups. . No lateral PMI. No thrills. Abdomen:  . Abdomen is soft, non-tender, non-distended . No hernias, masses, or organomegaly . Normoactive bowel sounds.  Musculoskeletal:  . No cyanosis, clubbing, or edema Skin:  . No rashes, lesions, ulcers . palpation of skin: no induration or nodules Neurologic:  . CN 2-12 intact . Sensation all 4 extremities intact . Pt is moving all extremities Psychiatric:  . Awake and alert.  I have personally reviewed the following:   Today's Data  . Vitals, CBC, BMP, FOBT  Imaging  . CT head . MRI brain  Cardiology Data  . EKG  Scheduled Meds: .  stroke: mapping our early stages of recovery book   Does not apply Once  . aspirin EC  81 mg Oral Daily  . Chlorhexidine Gluconate Cloth  6 each Topical Daily  . Chlorhexidine Gluconate Cloth  6 each Topical Q0600  . feeding supplement (NEPRO CARB STEADY)  237 mL Oral TID BM  . gabapentin  100 mg Oral QHS  . heparin  5,000 Units Subcutaneous Q8H  . insulin aspart  0-6 Units Subcutaneous Q4H  . multivitamin  1 tablet Oral QHS  . pantoprazole  40  mg Oral BID  . rosuvastatin  20 mg Oral q1800  . sevelamer carbonate  1,600 mg Oral TID WC   Continuous Infusions: . sodium chloride 100 mL (10/02/19 1911)  . sodium chloride    . sodium chloride    . dextrose 50 mL/hr at 10/03/19 0018    Principal Problem:   CVA (cerebral vascular accident) [facility]) Active Problems:   Dyslipidemia   Essential hypertension   DIASTOLIC HEART FAILURE, CHRONIC   ESRD (end stage renal disease) on dialysis ([place])   Controlled type 2 diabetes mellitus with hyperglycemia, without long-term current use of insulin (HCC)   Goals of care, counseling/discussion   DNR (do not resuscitate)   LOS: 1 day    A & P  CVA: Patient has been  admitted to a telemetry bed. Neurology has been consulted. Dr. The infarct is felt to be embolic in origin. I have discussed the patient with Dr. Rodney Booze. He will see the patient today. Defer to neurology in terms of appropriateness of repeat studies. As this is an embolic CVA, doubt that there is a role for Vascular surgery here.  HTN: We are following a liberal approach to hypertension in this patient. Will treat only if >220/120. However pt was on midodrine at home.  Hyperlipidemia: The patient has been continued on Crestor as at home.  DM II: Under fair control per recent HgbA1c. Glucoses are followed with FSBS and SSI.  Chronic diastolic CHF: Monitor volume status  ESRD on HD: Nephrology has been consulted.   DNR/Goals of Care: Patient previously met with palliative care to establish a MOST form. She is clearly DNR but the other goals include ongoing limited treatment including IVF and antibiotics if needed. Given recurrent CVA with prior extensive deficits, repeat palliative care evaluation and discussion appears to be warranted. I did discuss aggressive CVA evaluation and management vs. Transition to comfort care with the husband and he clearly favored more aggressive care at this time.  I have seen and examined this patient myself. I have spent 46 minutes in her evaluation and care. More than 50% of this was spent in coordination of care with Dr. Rodney Booze.  DVT prophylaxis:Heparin Code Status:DNR - confirmed with paperwork/family Family Communication:None present. Disposition Plan:To be determined    , DO [facility] Direct contact: see [email or web address]  7PM-7AM contact night coverage as above 10/03/2019, 5:35 PM  LOS: 1 day

## 2019-10-03 NOTE — Progress Notes (Signed)
Pt states she has a legal guardian, but not sure if she clearly understands this question at this time. This part of admission assessment need to be re-assessed with pt's husband.

## 2019-10-03 NOTE — Progress Notes (Signed)
  [facility] Progress Note   Assessment/ Plan:   Dialysis Orders:  MWF at [facility] 4hr, 300/600, EDW 51.5kg, 2K/2Ca, UFP #4, TDC, heparin 1000 bolus - Hectoral 10mcg IV q HD - Mircera 150 q 2weeks (last 2/10)  Assessment/Plan: 1.  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 Per neuro/hospitalist. 2.  ESRD: Usual MWF schedule - will do today 2/18 off schedule, will use low blood flow rate, pre-HD midodrine, lower machine temp, and minimal UF.  No heparin as well 3.  Hypertension/volume:BP low - no edema. S/p fluid bolus. 4.  Anemia: Hgb 9.4 5.  Metabolic bone disease: Ca/Phos 6.  DNR- appreciate palliative care c/s   Subjective:    Better this AM, alert and conversant, oriented.  Asks what happened- discussed yesterday's circumstances.     Objective:   BP 98/70 (BP Location: Left Arm)   Pulse (!) 118   Temp 98.4 F (36.9 C) (Oral)   Resp 18   Ht 5\' 8"  (1.727 m)   Wt 52.5 kg   SpO2 99%   BMI 17.60 kg/m   Physical Exam: Gen: frail elderly woman, NAD, lying in bed CVS: tachycardic no rub Resp: clear bilaterally Abd: soft, nontender Ext: no LE edema NEURO: R dense hemiplegia, AAO x 3 today ACCESS: R IJ TDC  Labs: BMET Recent Labs  Lab 10/02/19 0734  NA 136  K 4.2  CL 95*  CO2 24  GLUCOSE 109*  BUN 49*  CREATININE 5.03*  CALCIUM 8.6*   CBC Recent Labs  Lab 10/02/19 0734  WBC 11.9*  NEUTROABS 8.4*  HGB 9.4*  HCT 31.9*  MCV 103.2*  PLT 161      Medications:    .  stroke: mapping our early stages of recovery book   Does not apply Once  . aspirin EC  81 mg Oral Daily  . Chlorhexidine Gluconate Cloth  6 each Topical Daily  . Chlorhexidine Gluconate Cloth  6 each Topical Q0600  . gabapentin  100 mg Oral QHS  . heparin  5,000 Units Subcutaneous Q8H  . insulin aspart  0-6 Units Subcutaneous Q4H  . multivitamin  1 tablet Oral QHS  . pantoprazole  40 mg Oral BID  . rosuvastatin  20 mg Oral q1800  . sevelamer carbonate  1,600 mg Oral TID  WC     Madelon Lips, MD 10/03/2019, 9:32 AM

## 2019-10-03 NOTE — Progress Notes (Signed)
[name] updated

## 2019-10-03 NOTE — Significant Event (Addendum)
Notified that patient had a brief episode of unresponsiveness associated with HR of 220s.   On bedside evaluation patient is alert, oriented to self/place. HR 120s. Obtain EKG. Multiple runs of Vtach/PVCs noted. STAT BMP/MG ordered   MG 1.5 > Given 4 mg MG   BP 76/58 > Ordered 250 ml bolus x 2 along with 5 mg Midodrine (patient typically takes 10 mg Midodrine on Dialysis days and only took 5 mg today). >> Will follow up on repeat BP after fluid bolus and midodrine are completed.   Hayden Pedro, AGACNP-BC Pgr: [phone]

## 2019-10-03 NOTE — Evaluation (Signed)
Occupational Therapy Evaluation Patient Details Name: [name] MRN: [id number] DOB: 09-26-1940 Today's Date: 10/03/2019    History of Present Illness 79 y.o. female with past medical history of left brain stroke, thrombocytopenia, myocardial infarct, hypertension, hyperlipidemia, CAD, carotid stenosis and diabetes.  Most of information is gathered from chart secondary to patient not able to give a good history.  Patient was previously admitted from 07/23/2019 and then was admitted to CIR through 08/24/2019.  Upon returning home per husband she was having good and bad days.  She was flaccid on the right side.  On day of admission he noted that he was having difficulty getting her to respond.  Patient was brought to the hospital for further evaluation.  Patient had a MRI while in the hospital and was found to have extension of her prior large left MCA with new embolic CVAs on MRI   Clinical Impression   Pt admitted with above and presents to OT with impairments impacting ability to complete ADLs at [facility].  Pt does live with husband and is dependent for all ADLs at bed level.  She reports having CNA 5 days/week that assist with bathing at bed level and working on standing transfers with HHPT, does not mention HHOT.  Currently pt denies any change from baseline.  Pt A&O x3, follows all one step commands.  Continues to demonstrate Rt hemiparesis and poor sitting balance.  Pt required +2 max assist for rolling and progressing to sitting at EOB.  Required intermittent mod-max assist for sitting balance due to abnormal posture and decreased sitting balance.  HR 122-125 bpm during sesison and BP 113/69 initially upon sitting and 106/79 after 2 mins.  Due to pt presenting close to functional baseline, recommend d/c home with home health.    If pt was not receiving HHOT, pt would benefit from [facility] to address functional mobility during self-care tasks.    Follow Up Recommendations  Home health  OT;Supervision/Assistance - 24 hour    Equipment Recommendations  None recommended by OT (has all DME)      Precautions / Restrictions Precautions Precautions: Fall      Mobility Bed Mobility Overal bed mobility: Needs Assistance Bed Mobility: Rolling;Sit to Supine;Sit to Sidelying Rolling: Total assist     Sit to supine: Max assist;+2 for physical assistance Sit to sidelying: Max assist;+2 for physical assistance             ADL either performed or assessed with clinical judgement   ADL Overall ADL's : Needs assistance/impaired     Grooming: Wash/dry face;Brushing hair;Sitting;Moderate assistance Grooming Details (indicate cue type and reason): Mod-max assist sitting balance at EOB while pt washed face, required assist to comb hair Upper Body Bathing: Moderate assistance;Sitting   Lower Body Bathing: Total assistance;Bed level   Upper Body Dressing : Maximal assistance;Sitting   Lower Body Dressing: Total assistance;+2 for physical assistance;Bed level               Functional mobility during ADLs: Maximal assistance;+2 for physical assistance;+2 for safety/equipment General ADL Comments: Mod-max assist for static and dynamic sitting balance at EOB, +2 for safety and assistance at bed level during self-care tasks                  Pertinent Vitals/Pain Pain Assessment: Faces Faces Pain Scale: Hurts a little bit Pain Location: Rt arm with movement Pain Intervention(s): Limited activity within patient's tolerance;Monitored during session     Hand Dominance Right   Extremity/Trunk Assessment  Upper Extremity Assessment Upper Extremity Assessment: RUE deficits/detail RUE Deficits / Details: gross grasp, no active shoulder, elbow, or wrist movement RUE Sensation: decreased light touch;decreased proprioception RUE Coordination: decreased fine motor;decreased gross motor           Communication Communication Communication: HOH   Cognition  Arousal/Alertness: Awake/alert Behavior During Therapy: WFL for tasks assessed/performed Overall Cognitive Status: No family/caregiver present to determine baseline cognitive functioning                                 General Comments: Pt able to follow 1 step commands; oriented to self, time, and place with increased time              Home Living Family/patient expects to be discharged to:: Private residence Living Arrangements: Spouse/significant other Available Help at Discharge: Family;Available 24 hours/day Type of Home: House Home Access: Ramped entrance     Home Layout: One level     Bathroom Shower/Tub: Teacher, early years/pre: Standard     Home Equipment: Environmental consultant - 4 wheels;Toilet riser;Other (comment);Hospital bed;Wheelchair - manual(hoyer lift, manual TIS w/c)          Prior Functioning/Environment Level of Independence: Needs assistance  Gait / Transfers Assistance Needed: Working on standing to walker with HHPT ADL's / Homemaking Assistance Needed: PCA 5 days/week typically bathes her in the bed   Comments: PCA 5 days/week        OT Problem List: Decreased strength;Decreased range of motion;Decreased activity tolerance;Impaired balance (sitting and/or standing);Cardiopulmonary status limiting activity;Impaired UE functional use;Pain      OT Treatment/Interventions: Self-care/ADL training;Neuromuscular education;DME and/or AE instruction;Balance training;Patient/family education;Therapeutic activities    OT Goals(Current goals can be found in the care plan section) Acute Rehab OT Goals Patient Stated Goal: none stated OT Goal Formulation: With patient Time For Goal Achievement: 10/17/19 Potential to Achieve Goals: Good  OT Frequency: Min 2X/week   Barriers to D/C: Decreased caregiver support          Co-evaluation PT/OT/SLP Co-Evaluation/Treatment: Yes Reason for Co-Treatment: Complexity of the patient's impairments  (multi-system involvement);For patient/therapist safety;To address functional/ADL transfers PT goals addressed during session: Mobility/safety with mobility OT goals addressed during session: ADL's and self-care      AM-PAC OT "6 Clicks" Daily Activity     Outcome Measure Help from another person eating meals?: A Lot Help from another person taking care of personal grooming?: A Lot Help from another person toileting, which includes using toliet, bedpan, or urinal?: Total Help from another person bathing (including washing, rinsing, drying)?: A Lot Help from another person to put on and taking off regular upper body clothing?: A Lot Help from another person to put on and taking off regular lower body clothing?: Total 6 Click Score: 10   End of Session Nurse Communication: Mobility status  Activity Tolerance: Patient tolerated treatment well Patient left: in bed;with bed alarm set  OT Visit Diagnosis: Unsteadiness on feet (R26.81);Muscle weakness (generalized) (M62.81);Other symptoms and signs involving the nervous system (R29.898);Pain Pain - Right/Left: Right Pain - part of body: Arm                Time: [id number] OT Time Calculation (min): 29 min Charges:  OT General Charges $OT Visit: 1 Visit OT Evaluation $OT Eval Moderate Complexity: [place], [place] 10/03/2019, 10:25 AM

## 2019-10-04 DIAGNOSIS — N186 End stage renal disease: Secondary | ICD-10-CM

## 2019-10-04 DIAGNOSIS — Z992 Dependence on renal dialysis: Secondary | ICD-10-CM

## 2019-10-04 DIAGNOSIS — Z7189 Other specified counseling: Secondary | ICD-10-CM

## 2019-10-04 LAB — GLUCOSE, CAPILLARY
Glucose-Capillary: 134 mg/dL — ABNORMAL HIGH (ref 70–99)
Glucose-Capillary: 163 mg/dL — ABNORMAL HIGH (ref 70–99)
Glucose-Capillary: 167 mg/dL — ABNORMAL HIGH (ref 70–99)
Glucose-Capillary: 190 mg/dL — ABNORMAL HIGH (ref 70–99)
Glucose-Capillary: 202 mg/dL — ABNORMAL HIGH (ref 70–99)
Glucose-Capillary: 242 mg/dL — ABNORMAL HIGH (ref 70–99)
Glucose-Capillary: 277 mg/dL — ABNORMAL HIGH (ref 70–99)
Glucose-Capillary: 279 mg/dL — ABNORMAL HIGH (ref 70–99)

## 2019-10-04 LAB — URINALYSIS, ROUTINE W REFLEX MICROSCOPIC
Bilirubin Urine: NEGATIVE
Glucose, UA: NEGATIVE mg/dL
Hgb urine dipstick: NEGATIVE
Ketones, ur: NEGATIVE mg/dL
Leukocytes,Ua: NEGATIVE
Nitrite: NEGATIVE
Protein, ur: NEGATIVE mg/dL
Specific Gravity, Urine: 1.005 (ref 1.005–1.030)
WBC, UA: >50 WBC/hpf — ABNORMAL HIGH (ref 0–5)
pH: 5 (ref 5.0–8.0)

## 2019-10-04 LAB — BASIC METABOLIC PANEL
Anion gap: 14 (ref 5–15)
BUN: 26 mg/dL — ABNORMAL HIGH (ref 8–23)
CO2: 24 mmol/L (ref 22–32)
Calcium: 8.7 mg/dL — ABNORMAL LOW (ref 8.9–10.3)
Chloride: 96 mmol/L — ABNORMAL LOW (ref 98–111)
Creatinine, Ser: 3.47 mg/dL — ABNORMAL HIGH (ref 0.44–1.00)
GFR calc Af Amer: 14 mL/min — ABNORMAL LOW (ref >60)
GFR calc non Af Amer: 12 mL/min — ABNORMAL LOW (ref >60)
Glucose, Bld: 256 mg/dL — ABNORMAL HIGH (ref 70–99)
Potassium: 3.3 mmol/L — ABNORMAL LOW (ref 3.5–5.1)
Sodium: 134 mmol/L — ABNORMAL LOW (ref 135–145)

## 2019-10-04 LAB — MRSA PCR SCREENING: MRSA by PCR: POSITIVE — AB

## 2019-10-04 LAB — MAGNESIUM: Magnesium: 2.8 mg/dL — ABNORMAL HIGH (ref 1.7–2.4)

## 2019-10-04 MED ORDER — GLYCOPYRROLATE 0.2 MG/ML IJ SOLN: 0.2000 mg | INTRAMUSCULAR | Status: DC | PRN

## 2019-10-04 MED ORDER — HALOPERIDOL LACTATE 2 MG/ML PO CONC
0.5000 mg | ORAL | Status: DC | PRN
  Filled 2019-10-04: qty 0.3

## 2019-10-04 MED ORDER — HALOPERIDOL 0.5 MG PO TABS: 0.5000 mg | ORAL_TABLET | ORAL | Status: DC | PRN

## 2019-10-04 MED ORDER — POTASSIUM CHLORIDE CRYS ER 20 MEQ PO TBCR
40.0000 meq | EXTENDED_RELEASE_TABLET | Freq: Once | ORAL | Status: AC
  Administered 2019-10-04: 40 meq via ORAL
  Filled 2019-10-04: qty 2

## 2019-10-04 MED ORDER — OXYCODONE HCL 20 MG/ML PO CONC
5.0000 mg | ORAL | Status: DC | PRN
  Administered 2019-10-05 (×2): 5 mg via SUBLINGUAL
  Filled 2019-10-04 (×2): qty 1

## 2019-10-04 MED ORDER — POLYVINYL ALCOHOL 1.4 % OP SOLN: 1.0000 [drp] | Freq: Four times a day (QID) | OPHTHALMIC | Status: DC | PRN

## 2019-10-04 MED ORDER — SODIUM CHLORIDE 0.9 % IV BOLUS: 250.0000 mL | Freq: Once | INTRAVENOUS | Status: DC

## 2019-10-04 MED ORDER — ONDANSETRON HCL 4 MG/2ML IJ SOLN: 4.0000 mg | Freq: Four times a day (QID) | INTRAMUSCULAR | Status: DC | PRN

## 2019-10-04 MED ORDER — ACETAMINOPHEN 325 MG PO TABS
650.0000 mg | ORAL_TABLET | Freq: Four times a day (QID) | ORAL | Status: DC | PRN
  Administered 2019-10-04: 21:00:00 650 mg via ORAL
  Filled 2019-10-04: qty 2

## 2019-10-04 MED ORDER — HALOPERIDOL LACTATE 5 MG/ML IJ SOLN: 0.5000 mg | INTRAMUSCULAR | Status: DC | PRN

## 2019-10-04 MED ORDER — BIOTENE DRY MOUTH MT LIQD: 15.0000 mL | OROMUCOSAL | Status: DC | PRN

## 2019-10-04 MED ORDER — LORAZEPAM 2 MG/ML PO CONC: 0.5000 mg | Freq: Three times a day (TID) | ORAL | Status: DC | PRN

## 2019-10-04 MED ORDER — ACETAMINOPHEN 650 MG RE SUPP: 650.0000 mg | Freq: Four times a day (QID) | RECTAL | Status: DC | PRN

## 2019-10-04 MED ORDER — ONDANSETRON 4 MG PO TBDP: 4.0000 mg | ORAL_TABLET | Freq: Four times a day (QID) | ORAL | Status: DC | PRN

## 2019-10-04 MED ORDER — GLYCOPYRROLATE 1 MG PO TABS
1.0000 mg | ORAL_TABLET | ORAL | Status: DC | PRN
  Filled 2019-10-04: qty 1

## 2019-10-04 MED ORDER — POTASSIUM CHLORIDE 10 MEQ/100ML IV SOLN: 10.0000 meq | INTRAVENOUS | Status: DC

## 2019-10-04 NOTE — Progress Notes (Addendum)
STROKE TEAM PROGRESS NOTE   INTERVAL HISTORY Her family is at the bedside. The pt is alert and oriented and tells me that she no longer wishes to have aggressive care and wants to go home to be comfortable. Seen with [facility] provider as well who just had a family meeting to decide on transition to hospice care.  Vitals:   10/04/19 0004 10/04/19 0338 10/04/19 0851 10/04/19 1100  BP: (!) 92/59 (!) 100/57 (!) 90/58 (!) 77/46  Pulse: 91 87 (!) 112 87  Resp:      Temp: 98.4 F (36.9 C) 98.7 F (37.1 C)  99.1 F (37.3 C)  TempSrc: Oral Oral  Oral  SpO2: 100% 100%  96%  Weight:      Height:       Basic Metabolic Panel:  Recent Labs  Lab 10/03/19 1203 10/03/19 1203 10/03/19 2002 10/04/19 0450 10/04/19 1016  NA 133*   < > 134* 134*  --   K 4.8   < > 3.7 3.3*  --   CL 93*   < > 96* 96*  --   CO2 23   < > 24 24  --   GLUCOSE 205*   < > 254* 256*  --   BUN 62*   < > 25* 26*  --   CREATININE 6.10*   < > 3.24* 3.47*  --   CALCIUM 9.1   < > 8.8* 8.7*  --   MG  --   --  1.5*  --  2.8*  PHOS 5.4*  --   --   --   --    < > = values in this interval not displayed.   Lipid Panel:     Component Value Date/Time   CHOL 59 07/23/2019 0420   CHOL 110 06/26/2018 0936   TRIG 74 07/23/2019 0420   HDL 28 (L) 07/23/2019 0420   HDL 59 06/26/2018 0936   CHOLHDL 2.1 07/23/2019 0420   VLDL 15 07/23/2019 0420   LDLCALC 16 07/23/2019 0420   LDLCALC 26 06/26/2018 0936   HgbA1c:  Lab Results  Component Value Date   HGBA1C 5.3 07/23/2019   PHYSICAL EXAM Constitutional: Appears thin, chronically ill.  Psych: Affect appropriate to situation Eyes: No scleral injection Head: Normocephalic.  Cardiovascular: Normal rate and regular rhythm.  Respiratory: Effort normal, non-labored breathing GI: Soft. No distension. There is no tenderness.  Skin: WDI  Neuro: Mental Status: Patient is awake, alert, oriented to person, place and time. Speech-she has intact naming, repeating, comprehension. She is  not clear why she is back in the hospital.  She believes it is because of a stroke but does not remember being altered Cranial Nerves: II: Visual Fields are full.  III,IV, VI: EOMI without ptosis or diploplia. Pupils equal, round and reactive to light V: Facial sensation is symmetric to temperature VII: When talking she has decreased movement on the left portion of her mouth VIII: hearing is intact to voice X: Palat elevates symmetrically XI: Shoulder shrug is symmetric. XII: tongue is midline without atrophy or fasciculations.  Motor: Patient cannot move her right arm or leg, flaccid hemiparesis remains. Left side moves wnl.  Sensory: Sensation is symmetric to light touch and temperature in the arms and legs. DSS Deep Tendon Reflexes: 2+ on the right patella and right brachioradialis.  Left patella is 2+ and left brachioradialis is 2+ and brisk Plantars: Mute on the right with downgoing on the left Cerebellar: FNF with no dysmetria on the  left.  Unable to obtain heel-to-shin with left leg over right due to weakness  ASSESSMENT/PLAN [name] is a 79 y.o. female with history of recent stroke, now presenting with further strokes. Given recent wk up and now moving to comfort, no further wk up at this time  Stroke: Ischemic infarct, embolic secondary to severe heart failure of occult Afib  CT: recent infarct with evolution as expected.  MRI:  late subacute left ACA territory infarct which has increased in extent as compared to MRI on 07/23/2019, although without evidence of superimposed acute infarct on the current exam.  Chronic petechial hemorrhage now present at this site.  Multiple punctate acute or early subacute infarcts within bilateral cerebral hemispheres, corpus callosum and left cerebellum.  Given involvement of multiple vascular territories, correlate for a embolic phenomenon  2D Echo 25% EF with Severe LV dysfunction  LDL 16  HgbA1c 5.3  No VTE prophylaxis     Diet   DIET DYS 3 Room service appropriate? Yes; Fluid consistency: Nectar Thick     ASA 74m prior to admission, now on ASA 857m Therapy recommendations: Palliative Care  Disposition:  Home with hospice  Hypertension . No further goals given [facility] to [facility] type II is Controlled  HgbA1c 5.3, goal < 7.0  CBGs Recent Labs    10/04/19 0352 10/04/19 0911 10/04/19 1147  GLUCAP 279* 202* 242*      SSI while in hospital  Other Stroke Risk Factors  Advanced age  Hx of recent stroke  Coronary artery disease  Congestive heart failure  Other Active Problems  Sever heart failure  ESRD on HD  Palliative Care just met with family and they have decided to transition home with hospice care at this time.  No further stroke work up   Would not recommend anticoagulation at this time  Clear goals of care are now comfort  Hospital day # 2  Desiree Metzger-Cihelka, ARNP-C, ANVP-BC Pager: [phone] have personally obtained history,examined this patient, reviewed notes, independently viewed imaging studies, participated in medical decision making and plan of care.ROS completed by me personally and pertinent positives fully documented  I have made any additions or clarifications directly to the above note. Agree with note above.  I personally reviewed history of presenting illness, electronic medical records and imaging films in PACS.  I discussed with patient's husband and son at the bedside as well as palliative care provider.  Patient is mentally alert and able to make decisions and clearly expresses a desire for palliative care and to go home and not do any further stroke work-up.  I am in agreement with plan.  Greater than 50% time during this 25-minute visit was spent on counseling and coordination of care and discussion with care team.  Stroke team will sign off. kindly call for questions PrAntony ContrasMDAdvanceager: [phone]/19/2021 6:50 PM  To contact Stroke Continuity provider, please refer to [email or web address], contact General Neurology

## 2019-10-04 NOTE — Progress Notes (Addendum)
Daily Progress Note   Patient Name: [name]       Date: 10/04/2019 DOB: September 08, 1940  Age: 79 y.o. MRN#: [id number] Attending Physician: Karie Kirks, DO Primary Care Physician: [name], MD Admit Date: 10/02/2019  Reason for Consultation/Follow-up: Establishing goals of care and Psychosocial/spiritual support  Overnight patient had an episode of V-Tach resulting in loss of consciousness.  Subjective: Talked with patient, son, husband and sister in law at bedside.  After understanding her current condition (recurrent stroke, worsening BP and arrhythmia after hemodialysis - unable to tolerate effective dialysis) patient and her family opted to stop hemodialysis and go home with hospice services.  Son requested help with overnight care.   I suggested privately hired care thru an agency.  Son Darol Destine is the backbone of the family and the point man for organizing care for his parents.  We discussed EOL hospice house.  Darol Destine much prefers to respect his mother's wishes and to take her home.  Want a Hospice with [place] just in case it is needed in the last days.   Assessment:  Patient very weak but alert and orientated.  She is hypotensive and unable to tolerate HD.   Patient Profile/HPI:  79 y.o. 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  She was hypoglycemia and hypotensive.  There is concern about her ability to tolerate effective hemodialysis safely.     Length of Stay: 2  Current Medications: Scheduled Meds:  . aspirin EC  81 mg Oral Daily  .  Chlorhexidine Gluconate Cloth  6 each Topical Daily  . Chlorhexidine Gluconate Cloth  6 each Topical Q0600  . feeding supplement (NEPRO CARB STEADY)  237 mL Oral TID BM  . gabapentin  100 mg Oral QHS  . insulin aspart  0-6 Units Subcutaneous Q4H  . pantoprazole  40 mg Oral BID    Continuous Infusions: . dextrose 50 mL/hr at 10/03/19 2025    PRN Meds: acetaminophen **OR** acetaminophen (TYLENOL) oral liquid 160 mg/5 mL **OR** acetaminophen, camphor-menthol **AND** hydrOXYzine, docusate sodium, lidocaine (PF), lidocaine-prilocaine, LORazepam, ondansetron **OR** ondansetron (ZOFRAN)  IV, pentafluoroprop-tetrafluoroeth, senna-docusate, sorbitol  Physical Exam       Awake, alert, frail fragile female, surrounded by family. She is orientated, and coherent, responds appropriately and thoughtfully to questions.  Vital Signs: BP (!) 77/46 (BP Location: Left Arm)   Pulse 87   Temp 99.1 F (37.3 C) (Oral)   Resp (!) 21   Ht 5\' 8"  (1.727 m)   Wt 51.2 kg   SpO2 96%   BMI 17.16 kg/m  SpO2: SpO2: 96 % O2 Device: O2 Device: Room Air O2 Flow Rate: O2 Flow Rate (L/min): 4 L/min  Intake/output summary:   Intake/Output Summary (Last 24 hours) at 10/04/2019 1318 Last data filed at 10/04/2019 0900 Gross per 24 hour  Intake 1110 ml  Output 1993 ml  Net -883 ml   LBM: Last BM Date: 10/04/19 Baseline Weight: Weight: 51.6 kg Most recent weight: Weight: 51.2 kg       Palliative Assessment/Data: 20%      Patient Active Problem List   Diagnosis Date Noted  . Acute CVA (cerebrovascular accident) ([place])   . Combined systolic and diastolic heart failure ([place])   . Palliative care encounter   . Palliative care by specialist   . Goals of care, counseling/discussion   . DNR (do not resuscitate)   . Lethargy   . ESRD on dialysis ([place])   . Controlled type 2 diabetes mellitus with hyperglycemia, without long-term current use of insulin ([place])   . Labile blood glucose   . Chronic cystitis   .  Diarrhea due to malabsorption   . Small vessel cerebrovascular accident (CVA) ([place]) 07/27/2019  . Stage I pressure ulcer of sacral region 07/25/2019  . TIA (transient ischemic attack) 07/23/2019  . CVA (cerebral vascular accident) ([place]) 07/23/2019  . Volume overload 05/27/2018  . Dyspnea 05/27/2018  . Acute respiratory failure with hypoxia ([place]) 05/27/2018  . Acute respiratory distress 05/27/2018  . Acute encephalopathy 05/27/2018  . Aspiration pneumonia ([place]) 05/27/2018  . Pressure ulcer 05/19/2018  . Acute on chronic combined systolic and diastolic heart failure ([place]) 05/17/2018  . LBBB (left bundle branch block) 05/17/2018  . Aortic stenosis, moderate 05/17/2018  . NSTEMI (non-ST elevated myocardial infarction) ([place]) 05/16/2018  . Symptomatic anemia 05/15/2018  . Demand ischemia ([place]) 05/15/2018  . Protein-calorie malnutrition, severe 04/20/2018  . Pressure injury of skin 04/18/2018  . Lower GI bleeding 10/31/2016  . Blood loss anemia 10/31/2016  . AKI (acute kidney injury) ([place]) 10/31/2016  . Stroke ([place]) 09/26/2016  . ESRD (end stage renal disease) on dialysis ([place]) 06/23/2015  . Balance problem 12/22/2013  . Edema 12/28/2011  . CLAUDICATION 11/01/2010  . CAROTID ARTERY STENOSIS 10/29/2009  . AORTIC STENOSIS 09/17/2009  . Essential hypertension 11/17/2008  . DIASTOLIC HEART FAILURE, CHRONIC 11/17/2008  . Diabetes mellitus type II, non insulin dependent ([place]) 11/12/2008  . Dyslipidemia 11/12/2008  . Anemia, chronic renal failure 11/12/2008  . Thrombocytopenia ([place]) 11/12/2008  . Anxiety state 11/12/2008  . Coronary atherosclerosis 11/12/2008  . UNSPEC COMBINED SYSTOLIC&DIASTOLIC HEART FAILURE 0000000  . ESOPHAGEAL REFLUX 11/12/2008  . Chronic kidney disease (CKD), stage V ([place]) 11/12/2008  . LOW BACK PAIN, CHRONIC 11/12/2008  . INSOMNIA UNSPECIFIED 11/12/2008    Palliative Care Plan    Recommendations/Plan:  Home with hospice tomorrow.   Unfortunately I feel  the patient will decline very quickly.  Hospice needs to be involved as soon as she arrives at home.  Recommend she be discharged with most of her regular medications at home (as  long as she can take oral meds) except discontinue:  Folic acid, rena vit, crestor, renvela, and B12.  Would add liquid meds:  Ativan solution, Roxicodone concentrated solution both PRN for anxiety, SOB, or discomfort.  Goals of Care and Additional Recommendations:  Limitations on Scope of Treatment: Avoid Hospitalization and Full Comfort Care  Code Status:  DNR  Prognosis:  Days to less than 2 weeks.  Patient's BP has been so low I worry about her passing in hours to days.  Discharge Planning:  Home with Hospice  Care plan was discussed with RN, TOC, patient and family.  Thank you for allowing the Palliative Medicine Team to assist in the care of this patient.  Total time spent:  60 min.     Greater than 50%  of this time was spent counseling and coordinating care related to the above assessment and plan.  Florentina Jenny, PA-C Palliative Medicine  Please contact Palliative MedicineTeam phone at [phone] for questions and concerns between 7 am - 7 pm.   Please see AMION for individual provider pager numbers.

## 2019-10-04 NOTE — Progress Notes (Addendum)
Progress Note  Patient Name: [name] Date of Encounter: 10/04/2019  Primary Cardiologist: Loralie Champagne, MD   Subjective   Patient had about a 1 minute episode of VT last night at 19:27 with heart rates in the 220's. She reportedly had a brief episodes of unresponsiveness with this. Electrolytes were replaced and IV fluids were given for systolic BP in the [id number].   Patient does not remember this episode. She denies any recurrent palpitations. No chest pain or shortness of breath.   I spoke with Dr. Hollie Salk (Nephrology) - Patient is unable to tolerate dialysis from a blood pressure standpoint. Patient and husband have decided to proceed with hospice. Palliative care is reportedly going to come by later today.  Inpatient Medications    Scheduled Meds: .  stroke: mapping our early stages of recovery book   Does not apply Once  . aspirin EC  81 mg Oral Daily  . Chlorhexidine Gluconate Cloth  6 each Topical Daily  . Chlorhexidine Gluconate Cloth  6 each Topical Q0600  . feeding supplement (NEPRO CARB STEADY)  237 mL Oral TID BM  . gabapentin  100 mg Oral QHS  . heparin  5,000 Units Subcutaneous Q8H  . insulin aspart  0-6 Units Subcutaneous Q4H  . multivitamin  1 tablet Oral QHS  . pantoprazole  40 mg Oral BID  . rosuvastatin  20 mg Oral q1800  . sevelamer carbonate  1,600 mg Oral TID WC   Continuous Infusions: . dextrose 50 mL/hr at 10/03/19 2025  . potassium chloride     PRN Meds: acetaminophen **OR** acetaminophen (TYLENOL) oral liquid 160 mg/5 mL **OR** acetaminophen, alteplase, calcium carbonate (dosed in mg elemental calcium), camphor-menthol **AND** hydrOXYzine, docusate sodium, lidocaine (PF), lidocaine-prilocaine, ondansetron **OR** ondansetron (ZOFRAN) IV, pentafluoroprop-tetrafluoroeth, senna-docusate, sorbitol   Vital Signs    Vitals:   10/03/19 2300 10/04/19 0004 10/04/19 0338 10/04/19 0851  BP: (!) 91/54 (!) 92/59 (!) 100/57 (!) 90/58  Pulse:  91 87 (!) 112    Resp: (!) 21     Temp:  98.4 F (36.9 C) 98.7 F (37.1 C)   TempSrc:  Oral Oral   SpO2:  100% 100%   Weight:      Height:        Intake/Output Summary (Last 24 hours) at 10/04/2019 0939 Last data filed at 10/04/2019 0900 Gross per 24 hour  Intake 1350 ml  Output 1993 ml  Net -643 ml   Last 3 Weights 10/03/2019 10/03/2019 10/02/2019  Weight (lbs) 112 lb 14 oz 116 lb 2.9 oz 115 lb 11.9 oz  Weight (kg) 51.2 kg 52.7 kg 52.5 kg      Telemetry    Sinus rhythm with rates ranging from the 80's to 120's. Frequent PVCs noted sometimes in bigeminy pattern. 1 minute episodes of VT last night with rates in the 220's.  - Personally Reviewed  ECG    No new ECG today. - Personally Reviewed  Physical Exam   GEN: Frail elderly female resting comfortably in no acute distress.   Neck: Supple No JVD. Cardiac: RRR. III/VI systolic murmur noted throughout. No rubs or gallops. Radial pulses 2+ and equal bilaterally. Respiratory: Clear to auscultation bilaterally. No wheezes, rhonchi, or rales. GI: Soft, non-distended, and non-tender. Bowel sounds present. MS: No lower extremity edema; No deformity. Neuro:  Residual right sided paralysis from prior stroke. Psych: Somewhat drowsy but answers questions appropriately.  Labs    High Sensitivity Troponin:   Recent Labs  Lab 10/02/19  [place]  Lab 10/02/19 0734 10/02/19 0734 10/03/19 1203 10/03/19 2002 10/04/19 0450  NA 136   < > 133* 134* 134*  K 4.2   < > 4.8 3.7 3.3*  CL 95*   < > 93* 96* 96*  CO2 24   < > 23 24 24   GLUCOSE 109*   < > 205* 254* 256*  BUN 49*   < > 62* 25* 26*  CREATININE 5.03*   < > 6.10* 3.24* 3.47*  CALCIUM 8.6*   < > 9.1 8.8* 8.7*  PROT 6.0*  --   --   --   --   ALBUMIN 2.0*  --  2.8*  --   --   AST 21  --   --   --   --   ALT 14  --   --   --   --   ALKPHOS 82  --   --   --   --   BILITOT 0.7  --   --   --   --   GFRNONAA 8*   < > 6* 13* 12*  GFRAA 9*   < > 7*  15* 14*  ANIONGAP 17*   < > 17* 14 14   < > = values in this interval not displayed.     Hematology Recent Labs  Lab 10/02/19 0734 10/03/19 1203  WBC 11.9* 10.2  RBC 3.09* 3.27*  HGB 9.4* 10.2*  HCT 31.9* 32.5*  MCV 103.2* 99.4  MCH 30.4 31.2  MCHC 29.5* 31.4  RDW 19.7* 19.5*  PLT 161 161    BNPNo results for input(s): BNP, PROBNP in the last 168 hours.   DDimer No results for input(s): DDIMER in the last 168 hours.   Radiology    MR BRAIN WO CONTRAST  Result Date: 10/02/2019 CLINICAL DATA:  Ataxia, stroke suspected. Additional history provided: Decreased level of consciousness, history of CVA. EXAM: MRI HEAD WITHOUT CONTRAST TECHNIQUE: Multiplanar, multiecho pulse sequences of the brain and surrounding structures were obtained without intravenous contrast. COMPARISON:  Noncontrast head CT 10/02/2019, brain MRI 07/23/2019. FINDINGS: Brain: Intermittently motion degraded examination. Most notably there is moderate motion degradation of the sagittal T1 weighted sequence. Large late subacute left anterior cerebral artery vascular territory cortically based infarct predominantly involving the paramedian left frontal lobe with only minimal involvement of the adjacent left parietal lobe. This infarct is more extensive as compared to prior examination 07/23/2019. Corresponding chronic petechial hemorrhage now present at this site. There are several small foci of diffusion weighted hyperintensity which are too small to accurately characterize on the ADC map but likely reflect acute/early subacute infarcts as follows. Punctate infarct within the posterior body of the corpus callosum (series 5, image 79). Punctate infarct within the right corona radiata (series 5, image 77). Punctate infarct within the right frontal lobe white matter adjacent to the right frontal horn (series 5, image 75). Punctate infarct within the left occipital lobe periatrial white matter (series 5, image 70). Punctate  infarct within the left cerebellum (series 5, image 56). There is no evidence of intracranial mass. No midline shift or extra-axial fluid collection. Moderate/advanced ill-defined and scattered T2/FLAIR hyperintensity within the cerebral white matter is nonspecific, but consistent with chronic small vessel ischemic disease. Redemonstrated are multiple chronic lacunar infarcts within the cerebral white matter, right thalamus and bilateral basal ganglia. Fairly extensive chronic ischemic changes are also again noted within the pons. Moderate  generalized parenchymal atrophy Vascular: Flow voids maintained within the proximal large arterial vessels. Skull and upper cervical spine: Within the limitations of motion degradation, no focal marrow lesion is identified. Sinuses/Orbits: Visualized orbits demonstrate no acute abnormality. Minimal ethmoid sinus mucosal thickening bilateral mastoid effusions. IMPRESSION: 1. Large late subacute left ACA territory infarct which has increased in extent as compared to MRI 07/23/2019, although without evidence of superimposed acute infarct on the current exam. Chronic petechial hemorrhage now present at this site. 2. Multiple punctate acute/early subacute infarcts within the bilateral cerebral hemispheres, corpus callosum and left cerebellum as described. Given involvement of multiple vascular territories, correlate for an embolic phenomenon. 3. Stable background moderate/advanced chronic small vessel ischemic disease with multiple chronic lacunar infarcts. 4. Moderate generalized parenchymal atrophy. Electronically Signed   By: Kellie Simmering DO   On: 10/02/2019 11:14    Cardiac Studies   Echocardiogram 07/23/2019: Impressions: 1. Left ventricular ejection fraction, by visual estimation, is 25 to  30%. The left ventricle has severely decreased function. There is mildly  increased left ventricular hypertrophy.  2. Left ventricular diastolic parameters are consistent with Grade  I  diastolic dysfunction (impaired relaxation).  3. The left ventricle demonstrates global hypokinesis.  4. Global right ventricle has normal systolic function.The right  ventricular size is normal.  5. Left atrial size was normal.  6. Right atrial size was normal.  7. Mild mitral annular calcification.  8. The mitral valve is normal in structure. Mild mitral valve  regurgitation. No evidence of mitral stenosis.  9. The tricuspid valve is normal in structure. Tricuspid valve  regurgitation is mild.  10. The aortic valve is normal in structure. Aortic valve regurgitation is  not visualized. Moderate to severe aortic valve stenosis.  11. The pulmonic valve was not well visualized. Pulmonic valve  regurgitation is trivial.  12. The inferior vena cava is normal in size with greater than 50%  respiratory variability, suggesting right atrial pressure of 3 mmHg.  13. Severe global reduction in LV systolic function; grade 1 diastolic  dysfunction; mild LVH; calcified aortic valve with moderate to severe AS  by continuity equation (mean gradient 10 mmHg; AVA 1 cm2); mild MR;  catheter in right atrium with oscillating  density (consider vegetation vs thrombus).  _______________  TEE 08/02/2019: Impressions: 1. Left ventricular ejection fraction, by visual estimation, is 25 to  30%. The left ventricle has moderate to severely decreased function. There  is no left ventricular hypertrophy.  2. The left ventricle demonstrates global hypokinesis.  3. Global right ventricle has normal systolic function.The right  ventricular size is normal. No increase in right ventricular wall  thickness.  4. Left atrial size was normal.  5. Right atrial size was normal.  6. The mitral valve is normal in structure. Mild mitral valve  regurgitation. No evidence of mitral stenosis.  7. The tricuspid valve is normal in structure. Tricuspid valve  regurgitation is mild.  8. The aortic valve is  abnormal. Aortic valve regurgitation is not  visualized. No evidence of aortic valve sclerosis or stenosis.  9. Aortic valve area 2.0 cm^2 by planimetry. The left and non-coronary  cusps are restricted. Functionally bicuspid.  10. The pulmonic valve was normal in structure. Pulmonic valve  regurgitation is trivial.  11. Mild plaque invoving the descending aorta.   Patient Profile   Ms. Tahara is a 79 y.o. 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 Cardiology consulted for elevated high-sensitivity troponin.   Assessment & Plan    Elevated Troponin with Known CAD - Patient admitted with acute CVA and found to have elevated troponin.  - High-sensitivity troponin elevated at 1,597.  - EKG showed sinus tachycardia with known LBBB. - Patient DNR and is critically ill. Per Dr. Camillia Herter note on 10/02/2019, "She is not a candidate for invasive cardiac work-up going forward. Agree with supportive care and palliation."  VT - Patient had a brief episode of unresponsiveness last night with associated VT with heart rates in the 220's. BP was 76/58 so fluid bolus and Midodrine were given. - STAT Magnesium at time of event was 1.5. This was repleted. Keep >2.0. Repeat Magnesium pending. - Potassium 3.3 this morning. Keep >4.0. Repleted. - Patient has been hypotensive so no beta-blocker. - Patient and husband are likely going to proceed with hospice and palliative care. Do not think that there is much that we can offer at this time but will discuss with MD.  Chronic Combined CHF - Most recent Echo from 07/2019 showed LVEF of 25-30% with demonstrates global hypokinesis, mild LVH, and grade 1 diastolic dysfunction. Moderate to  severe aortic stenosis noted by continuation equation (mean gradient 10 mmHg; AVA 1 cm2) - Appears euvolemic on exam. - Not on any medical therapy due to hypotension. - Volume status has been managed by dialysis but patient is no longer able to tolerate this from a BP standpoint. I believe family has made the decision to stop dialysis and proceed with hospice.  ESRD on HD - Takes Midodrine on dialysis days.  - Management per Nephrology.  Acute CVA - Management per Neurology and primary team.   For questions or updates, please contact [place] Please consult [email or web address] for contact info under        Signed, Darreld Mclean, PA-C  10/04/2019, 9:39 AM

## 2019-10-04 NOTE — Progress Notes (Signed)
  Speech Language Pathology Treatment: Dysphagia  Patient Details Name: [name] MRN: [id number] DOB: Jan 20, 1941 Today's Date: 10/04/2019 Time: [id number] SLP Time Calculation (min) (ACUTE ONLY): 36 min  Assessment / Plan / Recommendation Clinical Impression  Patient today sitting upright in bed drinking Nepro and fully alert. She was willing to consume breakfast when it arrived.  SLP facilitated session by helping set up pt's meal.  She demonstrated mildly weak voice - = husband arrived to session and confirmed pt's voice is weaker than normal.  Intermittent throat clearing noted after nectar thick liquids - which may indicate laryngeal penetration.  Spouse and pt both deny pt having dysphagia - coughing with intake - prior to admit thus current dysphagia is due to her CVA.    Prolonged mastication noted but pt with adequate clearance orally. SLP did advise her to use applesauce or puree to transit orally masticated solids if she is having difficulty.    Using teach back and written precautions provided, pt and spouse informed of recommendations, clinical reasoning for precautions.   Pt appears to be tolerating diet at this time - and given subtle symptoms of pharyngeal deficits continuing - recommend to continue nectar thick liquids and water protocol.  Spouse and pt state understanding. Will continue to follow and anticipate pt's diet will be able to be advanced.        HPI HPI: 79 yo female adm to [facility] with confusion.  Pt found to have right sided weakness and found to have large subacute ACA CVA impacting left frontal and left parietal lobes, left cerebellum and bilateral cerebral hemisphere, corpus collasum cva.  Pt with h/o carotid stenosis, DM, arthritis, heart murmur, CHF, GERD, ESRD on HD.  Pt found to have a critical troponin.  Swallow and speech evaluation ordered. Pt CXR negative for acute event.      SLP Plan  Continue with current plan of care       Recommendations  Diet  recommendations: Dysphagia 3 (mechanical soft);Nectar-thick liquid(water between meals after mouth care) Liquids provided via: Cup;Straw Medication Administration: Whole meds with puree Compensations: Small sips/bites;Slow rate Postural Changes and/or Swallow Maneuvers: Seated upright 90 degrees;Upright 30-60 min after meal                Oral Care Recommendations: Oral care BID;Oral care prior to ice chip/H20 Follow up Recommendations: Skilled Nursing facility SLP Visit Diagnosis: Dysphagia, oropharyngeal phase (R13.12);Cognitive communication deficit [id number]) Plan: Continue with current plan of care       GO                [name] 10/04/2019, 10:33 AM  Kathleen Lime, MS [facility] SLP [place] Office [phone]

## 2019-10-04 NOTE — Progress Notes (Signed)
PROGRESS NOTE  [name] [id number] DOB: 18-Aug-1940 DOA: 10/02/2019 PCP: [name], MD  Brief History   [name] is a 79 y.o. female with medical history significant of CVA; CAD; HTN; HLD; DM; chronic diastolic CHF; and ESRD on MWF HD presenting with AMS.  She was previously admitted from 12/8-12 with a CVA with right-sided hemiplegia and went to CIR through 1/9.  Palliative care was involved during her stay and they completed a MOST form.  The patient is uncertain why she is here and is oriented x 1.  Her husband reports that she returned home with her husband - she has good days and bad.  She is completely unable to use her right side.  Today, he wasn't able to get her to respond.  She was unable to hold her coffee cup.  Her husband would like for her to be aggressively evaluated and treated for stroke once again, although she does have DNR paperwork and so will not be a full code based on her previously-stated wishes.  MOST form from 08/23/19, she prefers to be DNR; with limited interventions but no ICU; no feeding tube; antibiotics and IVF are ok.   Recent hospitalization with CVA, R-hemiplegia.  Not waking up well today - ?Xanax.  Abnormal CT, MRI with new over old strokes.  Neurology will see.  She is due for HD today.  [facility] were consulted to admit the patient for further evaluation and treatment.  The patient was admitted to a telemetry bed. Neurology has been consulted as had cardiology and nephrology.  Since admission the patient has had ongoing issues with hypotension, arrhythmias, and electrolytes. On the evening of 10/03/2019 the patient had an unresponsive episode during which she was found to be in VT at 238 bpm. This resolved. Cardiology was reconsulted this morning.  As planned, palliative care has met with family today. They have decided to convert the patient to comfort care. She will be discharged to home with hospice in the am.  Consultants   . Neurology . Nephrology . Cardiology  Procedures  . None  Antibiotics   Anti-infectives (From admission, onward)   None     Subjective  The patient is resting comfortably. No new complaints. Husband is at bedside.  Objective   Vitals:  Vitals:   10/04/19 0851 10/04/19 1100  BP: (!) 90/58 (!) 77/46  Pulse: (!) 112 87  Resp:    Temp:  99.1 F (37.3 C)  SpO2:  96%   Exam:  Constitutional:  . The patient is awake. She is confused.  No acute distress. Respiratory:  . No increased work of breathing. . No wheezes, rales, or rhonchi . No tactile fremitus Cardiovascular:  . Regular rate and rhythm . No murmurs, ectopy, or gallups. . No lateral PMI. No thrills. Abdomen:  . Abdomen is soft, non-tender, non-distended . No hernias, masses, or organomegaly . Normoactive bowel sounds.  Musculoskeletal:  . No cyanosis, clubbing, or edema Skin:  . No rashes, lesions, ulcers . palpation of skin: no induration or nodules Neurologic:  . CN 2-12 intact . Sensation all 4 extremities intact . Pt is moving all extremities Psychiatric:  . Awake and alert.  I have personally reviewed the following:   Today's Data  . Vitals  Imaging  . CT head . MRI brain  Cardiology Data  . EKG  Scheduled Meds: . aspirin EC  81 mg Oral Daily  . Chlorhexidine Gluconate Cloth  6 each Topical Daily  .  Chlorhexidine Gluconate Cloth  6 each Topical Q0600  . feeding supplement (NEPRO CARB STEADY)  237 mL Oral TID BM  . gabapentin  100 mg Oral QHS  . insulin aspart  0-6 Units Subcutaneous Q4H  . pantoprazole  40 mg Oral BID   Continuous Infusions: . dextrose 50 mL/hr at 10/03/19 2025    Principal Problem:   CVA (cerebral vascular accident) [facility]) Active Problems:   Dyslipidemia   Essential hypertension   DIASTOLIC HEART FAILURE, CHRONIC   ESRD (end stage renal disease) on dialysis ([place])   Controlled type 2 diabetes mellitus with hyperglycemia, without long-term current use of  insulin (HCC)   Goals of care, counseling/discussion   DNR (do not resuscitate)   Acute CVA (cerebrovascular accident) ([place])   Combined systolic and diastolic heart failure ([place])   Palliative care encounter   Encounter for hospice care discussion   LOS: 2 days    A & P  CVA: Patient has been admitted to a telemetry bed. Neurology has been consulted. Dr. The infarct is felt to be embolic in origin. I have discussed the patient with Dr. Rodney Booze. He will see the patient today. Defer to neurology in terms of appropriateness of repeat studies. As this is an embolic CVA, doubt that there is a role for Vascular surgery here. Palliative care has met with the family. They have decided to make her comfort care and to discharge her to home with hospice in the morning.   HTN: We are following a liberal approach to hypertension in this patient. Will treat only if >220/120. However pt was on midodrine at home.  Palliative care has met with the family. They have decided to make her comfort care and to discharge her to home with hospice in the morning.    Hyperlipidemia:  Palliative care has met with the family. They have decided to make her comfort care and to discharge her to home with hospice in the morning.    DM II:  Palliative care has met with the family. They have decided to make her comfort care and to discharge her to home with hospice in the morning.   Chronic diastolic CHF:  Palliative care has met with the family. They have decided to make her comfort care and to discharge her to home with hospice in the morning.   ESRD on HD: Nephrology has been consulted. Pt is not able to tolerate dialysis.  Palliative care has met with the family. They have decided to make her comfort care and to discharge her to home with hospice in the morning.   DNR/Goals of Care:  Palliative care has met with the family. They have decided to make her comfort care and to discharge her to home with hospice in the  morning.   I have seen and examined this patient myself. I have spent 46 minutes in her evaluation and care. More than 50% of this was spent in counseling with the spouse.  DVT prophylaxis:Heparin Code Status:DNR - confirmed with paperwork/family  Family Communication:I have disussed the patient in detail with the patient's spouse. All questions answered to the best of my ability. Disposition Plan:To home with hospice in the morning.  Rett Stehlik, DO [facility] Direct contact: see [email or web address]  7PM-7AM contact night coverage as above 10/04/2019, 3:44 PM  LOS: 1 day

## 2019-10-04 NOTE — Progress Notes (Signed)
Pt's potassium 3.3, [place] informed.

## 2019-10-04 NOTE — Progress Notes (Signed)
[facility])  Referral received for hospice services at home once discharged.  Spoke with family, provided support and answered questions.  Per son, they have to arrange sitters and that cannot start until Saturday am.  They request ambulance transport home tomorrow anytime after 9am.  No DME needs.  Please arrange for comfort scripts for pt, so there is no lapse in her comfort/symptoms prior to hospice services beginning.  Venia Carbon RN, BSN, [facility] Liaison (in [place]) [phone]

## 2019-10-04 NOTE — Progress Notes (Signed)
  [facility] Progress Note   Assessment/ Plan:   Dialysis Orders:  MWF at [facility] 4hr, 300/600, EDW 51.5kg, 2K/2Ca, UFP #4, TDC, heparin 1000 bolus - Hectoral 82mg IV q HD - Mircera 150 q 2weeks (last 2/10)  Assessment/Plan: 1.  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 Per neuro/hospitalist. 2.  ESRD: Usual MWF schedule - s/p HD 2/18, minimal UF.  BP remains low, is 77/46 this AM (noted interventions overnight with BP).  Will hold off on further HD orders until after family meetings.   3.  Hypertension/volume:BP low - no edema. S/p fluid bolus. 4.  Anemia: Hgb 9.4 5.  Metabolic bone disease: Ca/Phos 6.  DNR- appreciate palliative care c/s   Subjective:    Noted low BP overnight and this am, episode of Vtach last night.  Met with pt and husband at bedside.  Discussed grim circumstances and discussed that dialysis was likely doing more harm than good--> repeated strokes in this setting suggest at least an element of suboptimal cerebral perfusion during dialysis.  Discussed discontinuing HD all together.  Husband tearful but said "I don't want her to suffer".  He will discuss with son SDarol Destine([facility] and meet with palliative care later today.     Objective:   BP (!) 77/46 (BP Location: Left Arm)   Pulse 87   Temp 99.1 F (37.3 C) (Oral)   Resp (!) 21   Ht '5\' 8"'$  (1.727 m)   Wt 51.2 kg   SpO2 96%   BMI 17.16 kg/m   Physical Exam: Gen: frail elderly woman, NAD, sitting in bed CVS: tachycardic no rub Resp: clear anteriorly Abd: soft, nontender Ext: no LE edema NEURO: R dense hemiplegia, looks at me, seems like she's listening to the conversation but doesn't engage in it. ACCESS:Retta DionesTAdvanced Pain Management Labs: BMET Recent Labs  Lab 10/02/19 0734 10/03/19 1203 10/03/19 2002 10/04/19 0450  NA 136 133* 134* 134*  K 4.2 4.8 3.7 3.3*  CL 95* 93* 96* 96*  CO2 '24 23 24 24  '$ GLUCOSE 109* 205* 254* 256*  BUN 49* 62* 25* 26*  CREATININE 5.03* 6.10* 3.24* 3.47*  CALCIUM 8.6*  9.1 8.8* 8.7*  PHOS  --  5.4*  --   --    CBC Recent Labs  Lab 10/02/19 0734 10/03/19 1203  WBC 11.9* 10.2  NEUTROABS 8.4*  --   HGB 9.4* 10.2*  HCT 31.9* 32.5*  MCV 103.2* 99.4  PLT 161 161      Medications:    .  stroke: mapping our early stages of recovery book   Does not apply Once  . aspirin EC  81 mg Oral Daily  . Chlorhexidine Gluconate Cloth  6 each Topical Daily  . Chlorhexidine Gluconate Cloth  6 each Topical Q0600  . feeding supplement (NEPRO CARB STEADY)  237 mL Oral TID BM  . gabapentin  100 mg Oral QHS  . heparin  5,000 Units Subcutaneous Q8H  . insulin aspart  0-6 Units Subcutaneous Q4H  . multivitamin  1 tablet Oral QHS  . pantoprazole  40 mg Oral BID  . rosuvastatin  20 mg Oral q1800  . sevelamer carbonate  1,600 mg Oral TID WC     EMadelon Lips MD 10/04/2019, 12:01 PM

## 2019-10-04 NOTE — TOC Initial Note (Signed)
Transition of Care [facility]) - Initial/Assessment Note    Patient Details  Name: [name] MRN: [id number] Date of Birth: May 18, 1941  [facility] CM/SW Contact:    Geralynn Ochs, LCSW Phone Number: 10/04/2019, 5:27 PM  Clinical Narrative:     CSW alerted by palliative medicine team that patient's family has elected to take the patient home with hospice care, so CSW met with family to offer choice of hospice provider. Patient's family chose [facility]. CSW sent referral to [facility] and they are ready for patient to go home tomorrow. Family would prefer a morning discharge, they will have caregivers available after 9 am. CSW updated MD with plan for discharge. CSW to follow.              Expected Discharge Plan: Home w Hospice Care Barriers to Discharge: Continued Medical Work up   Patient Goals and CMS Choice Patient states their goals for this hospitalization and ongoing recovery are:: patient unable to participate in goal setting at this time; patient's family would like her kept comfortable and brought home with hospice care CMS Medicare.gov Compare Post Acute Care list provided to:: Patient Represenative (must comment) Choice offered to / list presented to : Adult Children  Expected Discharge Plan and Services Expected Discharge Plan: [place] Acute Care Choice: Hospice Living arrangements for the past 2 months: Single Family Home                                      Prior Living Arrangements/Services Living arrangements for the past 2 months: Single Family Home Lives with:: Spouse Patient language and need for interpreter reviewed:: No Do you feel safe going back to the place where you live?: Yes      Need for Family Participation in Patient Care: Yes (Comment) Care giver support system in place?: Yes (comment) Current home services: Home OT, Home PT Criminal Activity/Legal Involvement Pertinent to Current  Situation/Hospitalization: No - Comment as needed  Activities of Daily Living Home Assistive Devices/Equipment: Walker (specify type) ADL Screening (condition at time of admission) Patient's cognitive ability adequate to safely complete daily activities?: Yes Is the patient deaf or have difficulty hearing?: Yes Does the patient have difficulty seeing, even when wearing glasses/contacts?: No Does the patient have difficulty concentrating, remembering, or making decisions?: Yes Patient able to express need for assistance with ADLs?: Yes Does the patient have difficulty dressing or bathing?: Yes Independently performs ADLs?: No Communication: Independent Dressing (OT): Needs assistance Is this a change from baseline?: Pre-admission baseline Grooming: Needs assistance Is this a change from baseline?: Pre-admission baseline Feeding: Needs assistance Is this a change from baseline?: Change from baseline, expected to last <3 days Bathing: Needs assistance Is this a change from baseline?: Pre-admission baseline Toileting: Needs assistance Is this a change from baseline?: Pre-admission baseline In/Out Bed: Needs assistance Is this a change from baseline?: Pre-admission baseline Walks in Home: Needs assistance Is this a change from baseline?: Pre-admission baseline Does the patient have difficulty walking or climbing stairs?: Yes Weakness of Legs: Both Weakness of Arms/Hands: Right  Permission Sought/Granted Permission sought to share information with : Family Supports Permission granted to share information with : Yes, Verbal Permission Granted  Share Information with NAME: Darol Destine     Permission granted to share info w Relationship: Son     Emotional Assessment Appearance:: Appears stated  age Attitude/Demeanor/Rapport: Unable to Assess Affect (typically observed): Unable to Assess Orientation: : Oriented to Self, Oriented to Place Alcohol / Substance Use: Not Applicable Psych  Involvement: No (comment)  Admission diagnosis:  CVA (cerebral vascular accident) [facility]) [I63.9] Patient Active Problem List   Diagnosis Date Noted  . Encounter for hospice care discussion   . Acute CVA (cerebrovascular accident) ([place])   . 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 Palliative care encounter   . Palliative care by specialist   . Goals of care, counseling/discussion   . DNR (do not resuscitate)   . Lethargy   . ESRD on dialysis ([place])   . Controlled type 2 diabetes mellitus with hyperglycemia, without long-term current use of insulin ([place])   . Labile blood glucose   . Chronic cystitis   . Diarrhea due to malabsorption   . Small vessel cerebrovascular accident (CVA) ([place]) 07/27/2019  . Stage I pressure ulcer of sacral region 07/25/2019  . TIA (transient ischemic attack) 07/23/2019  . CVA (cerebral vascular accident) ([place]) 07/23/2019  . Volume overload 05/27/2018  . Dyspnea 05/27/2018  . Acute respiratory failure with hypoxia ([place]) 05/27/2018  . Acute respiratory distress 05/27/2018  . Acute encephalopathy 05/27/2018  . Aspiration pneumonia ([place]) 05/27/2018  . Pressure ulcer 05/19/2018  . Acute on chronic combined systolic and diastolic heart failure ([place]) 05/17/2018  . LBBB (left bundle branch block) 05/17/2018  . Aortic stenosis, moderate 05/17/2018  . NSTEMI (non-ST elevated myocardial infarction) ([place]) 05/16/2018  . Symptomatic anemia 05/15/2018  . Demand ischemia ([place]) 05/15/2018  . Protein-calorie malnutrition, severe 04/20/2018  . Pressure injury of skin 04/18/2018  . Lower GI bleeding 10/31/2016  . Blood loss anemia 10/31/2016  . AKI (acute kidney injury) ([place]) 10/31/2016  . Stroke ([place]) 09/26/2016  . ESRD (end stage renal disease) on dialysis ([place]) 06/23/2015  . Balance problem 12/22/2013  . Edema 12/28/2011  . CLAUDICATION 11/01/2010  . CAROTID ARTERY STENOSIS 10/29/2009  . AORTIC STENOSIS 09/17/2009  . Essential hypertension 11/17/2008   . DIASTOLIC HEART FAILURE, CHRONIC 11/17/2008  . Diabetes mellitus type II, non insulin dependent ([place]) 11/12/2008  . Dyslipidemia 11/12/2008  . Anemia, chronic renal failure 11/12/2008  . Thrombocytopenia ([place]) 11/12/2008  . Anxiety state 11/12/2008  . Coronary atherosclerosis 11/12/2008  . UNSPEC COMBINED SYSTOLIC&DIASTOLIC HEART FAILURE 82/50/5397  . ESOPHAGEAL REFLUX 11/12/2008  . Chronic kidney disease (CKD), stage V ([place]) 11/12/2008  . LOW BACK PAIN, CHRONIC 11/12/2008  . INSOMNIA UNSPECIFIED 11/12/2008   PCP:  [name], MD Pharmacy:   [id number] [place], [place] - [address] [place] [address]Phone: [phone]: [phone] [facility] [place] [address] [place]Phone: [phone]: [phone]    Social Determinants of Health (SDOH) Interventions    Readmission Risk Interventions No flowsheet data found.

## 2019-10-04 NOTE — Progress Notes (Signed)
Renal Navigator notified patient's OP HD clinic/[facility] of the decision to stop HD and transition home with Hospice care at this time.  Alphonzo Cruise, [place] Renal Navigator [phone]

## 2019-10-05 DIAGNOSIS — N186 End stage renal disease: Secondary | ICD-10-CM | POA: Diagnosis not present

## 2019-10-05 DIAGNOSIS — Z992 Dependence on renal dialysis: Secondary | ICD-10-CM | POA: Diagnosis not present

## 2019-10-05 DIAGNOSIS — I129 Hypertensive chronic kidney disease with stage 1 through stage 4 chronic kidney disease, or unspecified chronic kidney disease: Secondary | ICD-10-CM | POA: Diagnosis not present

## 2019-10-05 LAB — GLUCOSE, CAPILLARY
Glucose-Capillary: 164 mg/dL — ABNORMAL HIGH (ref 70–99)
Glucose-Capillary: 156 mg/dL — ABNORMAL HIGH (ref 70–99)

## 2019-10-05 MED ORDER — POLYVINYL ALCOHOL 1.4 % OP SOLN: 1.0000 [drp] | Freq: Four times a day (QID) | OPHTHALMIC | 0 refills | Status: AC | PRN

## 2019-10-05 MED ORDER — ACETAMINOPHEN 325 MG PO TABS
650.0000 mg | ORAL_TABLET | Freq: Three times a day (TID) | ORAL | Status: DC
  Administered 2019-10-05: 08:00:00 650 mg via ORAL
  Filled 2019-10-05: qty 2

## 2019-10-05 MED ORDER — LORAZEPAM 2 MG/ML PO CONC: 0.6000 mg | Freq: Three times a day (TID) | ORAL | Status: DC | PRN

## 2019-10-05 MED ORDER — ONDANSETRON 4 MG PO TBDP: 4.0000 mg | ORAL_TABLET | Freq: Four times a day (QID) | ORAL | 0 refills | Status: AC | PRN

## 2019-10-05 MED ORDER — SORBITOL 70 % SOLN: 30.0000 mL | 0 refills | Status: AC | PRN

## 2019-10-05 MED ORDER — BIOTENE DRY MOUTH MT LIQD: 15.0000 mL | OROMUCOSAL | 0 refills | Status: AC | PRN

## 2019-10-05 MED ORDER — DOCUSATE SODIUM 283 MG RE ENEM: 1.0000 | ENEMA | RECTAL | 0 refills | Status: AC | PRN

## 2019-10-05 MED ORDER — LORAZEPAM 2 MG/ML PO CONC: 0.5000 mg | Freq: Three times a day (TID) | ORAL | 0 refills | Status: AC | PRN

## 2019-10-05 MED ORDER — GLYCOPYRROLATE 1 MG PO TABS: 1.0000 mg | ORAL_TABLET | ORAL | 0 refills | Status: AC | PRN

## 2019-10-05 MED ORDER — OXYCODONE HCL 20 MG/ML PO CONC: 6.0000 mg | ORAL | Status: DC | PRN

## 2019-10-05 MED ORDER — OXYCODONE HCL 20 MG/ML PO CONC: 5.0000 mg | ORAL | 0 refills | Status: AC | PRN

## 2019-10-05 NOTE — Care Management (Signed)
Confirmed with son and hospice that patient has all need DME at home. Verified [place] address. CSW spoke w spouse this morning and he is available at home anytime to receive patient from [facility]. PTAR forms placed on chart.

## 2019-10-05 NOTE — Discharge Summary (Signed)
Physician Discharge Summary  [name]:[id number] DOB: 1941-01-31 DOA: 10/02/2019  PCP: [name], MD  Admit date: 10/02/2019 Discharge date: 10/05/2019  Recommendations for Outpatient Follow-up:  1. Patient is being discharged to home with hospice for end of life care. 2. Comfort care only.  Discharge Diagnoses: Principal diagnosis is #1 1. CVA 2. Hypertension 3. Hyperlipidemia 4. DM II 5. Chronic diastolic CHF 6. ESRD on HD 7. Hypotension preventing dialysis 8. Ventricular tachycardia with episode of unresponsiveness.  Discharge Condition: Terminal  Disposition: Home with hospice  Diet recommendation: Comfort feeds.  Filed Weights   10/02/19 2042 10/03/19 1340 10/03/19 1646  Weight: 52.5 kg 52.7 kg 51.2 kg    History of present illness: [name] is a 79 y.o. female with medical history significant of CVA; CAD; HTN; HLD; DM; chronic diastolic CHF; and ESRD on MWF HD presenting with AMS.  She was previously admitted from 12/8-12 with a CVA with right-sided hemiplegia and went to CIR through 1/9.  Palliative care was involved during her stay and they completed a MOST form.  The patient is uncertain why she is here and is oriented x 1.  Her husband reports that she returned home with her husband - she has good days and bad.  She is completely unable to use her right side.  Today, he wasn't able to get her to respond.  She was unable to hold her coffee cup.  Her husband would like for her to be aggressively evaluated and treated for stroke once again, although she does have DNR paperwork and so will not be a full code based on her previously-stated wishes.  MOST form from 08/23/19, she prefers to be DNR; with limited interventions but no ICU; no feeding tube; antibiotics and IVF are ok.     ED Course:  Recent hospitalization with CVA, R-hemiplegia.  Not waking up well today - ?Xanax.  Abnormal CT, MRI with new over old strokes.  Neurology will see.  She is due for HD  today.  Hospital Course: [facility] were consulted to admit the patient for further evaluation and treatment.  The patient was admitted to a telemetry bed. Neurology has been consulted as had cardiology and nephrology.  Since admission the patient has had ongoing issues with hypotension, arrhythmias, and electrolytes. On the evening of 10/03/2019 the patient had an unresponsive episode during which she was found to be in VT at 238 bpm. This resolved. Cardiology was reconsulted this morning.  As planned, palliative care has met with family today. They have decided to convert the patient to comfort care. She will be discharged to home with hospice this morning for end of life care.  Today's assessment: S: Pt is resting comfortably. No new complaints. O: Vitals:  Vitals:   10/04/19 1930 10/05/19 0829  BP: 110/63 103/60  Pulse: 63 90  Resp: 15 14  Temp: (!) 100.9 F (38.3 C) 98.7 F (37.1 C)  SpO2: 97% 94%   Constitutional:   The patient is awake. She is confused.  No acute distress. Respiratory:   No increased work of breathing.  No wheezes, rales, or rhonchi  No tactile fremitus Cardiovascular:   Regular rate and rhythm  No murmurs, ectopy, or gallups.  No lateral PMI. No thrills. Abdomen:   Abdomen is soft, non-tender, non-distended  No hernias, masses, or organomegaly  Normoactive bowel sounds.  Musculoskeletal:   No cyanosis, clubbing, or edema Skin:   No rashes, lesions, ulcers  palpation of skin:  no induration or nodules Neurologic:   CN 2-12 intact  Sensation all 4 extremities intact  Pt is moving all extremities Psychiatric:   Awake and alert.  Discharge Instructions  Discharge Instructions    Activity as tolerated - No restrictions   Complete by: As directed    Call MD for:  persistant nausea and vomiting   Complete by: As directed    Call MD for:  severe uncontrolled pain   Complete by: As directed    Diet general    Complete by: As directed    Comfort feeds   Discharge instructions   Complete by: As directed    Home with hospice Comfort care only   Increase activity slowly   Complete by: As directed      Allergies as of 10/05/2019      Reactions   Doxycycline Nausea And Vomiting   Caused "DEATHLY NAUSEA AND VOMITING"   Lipitor [atorvastatin] Other (See Comments)   Stomach pain   Strawberry Extract Rash      Medication List    STOP taking these medications   ALPRAZolam 1 MG tablet Commonly known as: XANAX   folic acid 1 MG tablet Commonly known as: FOLVITE   gabapentin 100 MG capsule Commonly known as: NEURONTIN   midodrine 10 MG tablet Commonly known as: PROAMATINE   multivitamin Tabs tablet   pantoprazole 40 MG tablet Commonly known as: PROTONIX   rosuvastatin 20 MG tablet Commonly known as: CRESTOR   saccharomyces boulardii 250 MG capsule Commonly known as: FLORASTOR   sevelamer carbonate 800 MG tablet Commonly known as: RENVELA   vitamin B-12 500 MCG tablet Commonly known as: CYANOCOBALAMIN     TAKE these medications   acetaminophen 325 MG tablet Commonly known as: TYLENOL Take 2 tablets (650 mg total) by mouth every 4 (four) hours as needed for mild pain (or temp > 37.5 C (99.5 F)).   antiseptic oral rinse Liqd Apply 15 mLs topically as needed for dry mouth.   aspirin 81 MG EC tablet Take 1 tablet (81 mg total) by mouth daily.   docusate sodium 283 MG enema Commonly known as: ENEMEEZ Place 1 enema (283 mg total) rectally as needed for severe constipation.   glycopyrrolate 1 MG tablet Commonly known as: ROBINUL Take 1 tablet (1 mg total) by mouth every 4 (four) hours as needed (excessive secretions).   LORazepam 2 MG/ML concentrated solution Commonly known as: ATIVAN Take 0.3 mLs (0.6 mg total) by mouth every 8 (eight) hours as needed for anxiety or sleep.   miconazole nitrate Powd Commonly known as: MICATIN Apply 1 application topically 2 (two)  times daily.   ondansetron 4 MG disintegrating tablet Commonly known as: ZOFRAN-ODT Take 1 tablet (4 mg total) by mouth every 6 (six) hours as needed for nausea.   oxyCODONE 20 MG/ML concentrated solution Commonly known as: ROXICODONE INTENSOL Place 0.3 mLs (6 mg total) under the tongue every hour as needed for severe pain (shortness of breath, air hunger or for comfort at end of life.).   polyvinyl alcohol 1.4 % ophthalmic solution Commonly known as: LIQUIFILM TEARS Place 1 drop into both eyes 4 (four) times daily as needed for dry eyes.   sorbitol 70 % Soln Take 30 mLs by mouth as needed for moderate constipation.      Allergies  Allergen Reactions  . Doxycycline Nausea And Vomiting    Caused "DEATHLY NAUSEA AND VOMITING"  . Lipitor [Atorvastatin] Other (See Comments)    Stomach pain  .  Strawberry Extract Rash    The results of significant diagnostics from this hospitalization (including imaging, microbiology, ancillary and laboratory) are listed below for reference.    Significant Diagnostic Studies: CT Head Wo Contrast  Result Date: 10/02/2019 CLINICAL DATA:  Ataxia and lethargy EXAM: CT HEAD WITHOUT CONTRAST TECHNIQUE: Contiguous axial images were obtained from the base of the skull through the vertex without intravenous contrast. COMPARISON:  July 23, 2019 FINDINGS: Brain: There is mild diffuse atrophy, stable. There is no intracranial mass, hemorrhage, extra-axial fluid collection, or midline shift. There is evidence of infarct in the superior left frontal lobe extending to the left frontoparietal junction, not present on most recent CT and felt to be recent. There is patchy small vessel disease in the centra semiovale, stable. Prior small lacunar infarct in the superior left lentiform nucleus is stable. Prior small infarct in the left thalamus noted. Prior infarct noted in the right insular cortex region as well as in the anterior limb of the right internal capsule. There  is also a prior small infarct in the mid right lentiform nucleus. Vascular: There is no appreciable hyperdense vessel. There is arterial vascular calcification in the distal vertebral arteries and carotid siphon regions bilaterally. Skull: The bony calvarium appears intact. Sinuses/Orbits: There is mucosal thickening in the posterior right maxillary antrum. There is mucosal thickening in several ethmoid air cells bilaterally. Orbits appear symmetric bilaterally. Other: There is opacification of multiple mastoid air cells bilaterally. IMPRESSION: 1. Recent and potentially acute infarct in the superior left frontal lobe with involvement of the left frontal-parietal junction near the vertex as well. 2. Underlying atrophy with periventricular small vessel disease. Prior infarcts in the basal ganglia regions, left thalamus, and right insular cortex regions noted. No evident mass or hemorrhage. 3.  Foci of arterial vascular calcification noted. 4. Areas of paranasal sinus disease and bilateral mastoid air cell disease noted. Electronically Signed   By: Lowella Grip III M.D.   On: 10/02/2019 08:54   MR BRAIN WO CONTRAST  Result Date: 10/02/2019 CLINICAL DATA:  Ataxia, stroke suspected. Additional history provided: Decreased level of consciousness, history of CVA. EXAM: MRI HEAD WITHOUT CONTRAST TECHNIQUE: Multiplanar, multiecho pulse sequences of the brain and surrounding structures were obtained without intravenous contrast. COMPARISON:  Noncontrast head CT 10/02/2019, brain MRI 07/23/2019. FINDINGS: Brain: Intermittently motion degraded examination. Most notably there is moderate motion degradation of the sagittal T1 weighted sequence. Large late subacute left anterior cerebral artery vascular territory cortically based infarct predominantly involving the paramedian left frontal lobe with only minimal involvement of the adjacent left parietal lobe. This infarct is more extensive as compared to prior examination  07/23/2019. Corresponding chronic petechial hemorrhage now present at this site. There are several small foci of diffusion weighted hyperintensity which are too small to accurately characterize on the ADC map but likely reflect acute/early subacute infarcts as follows. Punctate infarct within the posterior body of the corpus callosum (series 5, image 79). Punctate infarct within the right corona radiata (series 5, image 77). Punctate infarct within the right frontal lobe white matter adjacent to the right frontal horn (series 5, image 75). Punctate infarct within the left occipital lobe periatrial white matter (series 5, image 70). Punctate infarct within the left cerebellum (series 5, image 56). There is no evidence of intracranial mass. No midline shift or extra-axial fluid collection. Moderate/advanced ill-defined and scattered T2/FLAIR hyperintensity within the cerebral white matter is nonspecific, but consistent with chronic small vessel ischemic disease. Redemonstrated are multiple chronic lacunar  infarcts within the cerebral white matter, right thalamus and bilateral basal ganglia. Fairly extensive chronic ischemic changes are also again noted within the pons. Moderate generalized parenchymal atrophy Vascular: Flow voids maintained within the proximal large arterial vessels. Skull and upper cervical spine: Within the limitations of motion degradation, no focal marrow lesion is identified. Sinuses/Orbits: Visualized orbits demonstrate no acute abnormality. Minimal ethmoid sinus mucosal thickening bilateral mastoid effusions. IMPRESSION: 1. Large late subacute left ACA territory infarct which has increased in extent as compared to MRI 07/23/2019, although without evidence of superimposed acute infarct on the current exam. Chronic petechial hemorrhage now present at this site. 2. Multiple punctate acute/early subacute infarcts within the bilateral cerebral hemispheres, corpus callosum and left cerebellum as  described. Given involvement of multiple vascular territories, correlate for an embolic phenomenon. 3. Stable background moderate/advanced chronic small vessel ischemic disease with multiple chronic lacunar infarcts. 4. Moderate generalized parenchymal atrophy. Electronically Signed   By: Kellie Simmering DO   On: 10/02/2019 11:14   DG Chest Port 1 View  Result Date: 10/02/2019 CLINICAL DATA:  Altered level of consciousness. Coronary artery disease. Diastolic congestive heart failure. Chronic kidney disease. EXAM: PORTABLE CHEST 1 VIEW COMPARISON:  08/04/2019 FINDINGS: Stable mild cardiomegaly. Right jugular dual-lumen central venous catheter remains in appropriate position. Prior CABG again noted. No evidence of pulmonary infiltrate or edema. No evidence of pleural effusion. IMPRESSION: Stable mild cardiomegaly. No acute findings. Electronically Signed   By: Marlaine Hind M.D.   On: 10/02/2019 08:15    Microbiology: Recent Results (from the past 240 hour(s))  SARS CORONAVIRUS 2 (TAT 6-24 HRS) Nasopharyngeal Nasopharyngeal Swab     Status: None   Collection Time: 10/02/19  1:50 PM   Specimen: Nasopharyngeal Swab  Result Value Ref Range Status   SARS Coronavirus 2 NEGATIVE NEGATIVE Final    Comment: (NOTE) SARS-CoV-2 target nucleic acids are NOT DETECTED. The SARS-CoV-2 RNA is generally detectable in upper and lower respiratory specimens during the acute phase of infection. Negative results do not preclude SARS-CoV-2 infection, do not rule out co-infections with other pathogens, and should not be used as the sole basis for treatment or other patient management decisions. Negative results must be combined with clinical observations, patient history, and epidemiological information. The expected result is Negative. Fact Sheet for Patients: SugarRoll.be Fact Sheet for Healthcare Providers: https://www.woods-mathews.com/ This test is not yet approved or  cleared by the [place] FDA and  has been authorized for detection and/or diagnosis of SARS-CoV-2 by FDA under an Emergency Use Authorization (EUA). This EUA will remain  in effect (meaning this test can be used) for the duration of the COVID-19 declaration under Section 56 4(b)(1) of the Act, 21 U.S.C. section 360bbb-3(b)(1), unless the authorization is terminated or revoked sooner. Performed at [facility], [place] [address], [place], [place] [address]   MRSA PCR Screening     Status: Abnormal   Collection Time: 10/04/19  5:56 AM   Specimen: Nasopharyngeal  Result Value Ref Range Status   MRSA by PCR POSITIVE (A) NEGATIVE Final    Comment:        The GeneXpert MRSA Assay (FDA approved for NASAL specimens only), is one component of a comprehensive MRSA colonization surveillance program. It is not intended to diagnose MRSA infection nor to guide or monitor treatment for MRSA infections. RESULT CALLED TO, READ BACK BY AND VERIFIED WITH: OLIVER ON [id number] AT 0727 BY NFIELDS Performed at [facility], [place],  [place] [address]      Labs: Basic Metabolic Panel: Recent Labs  Lab 10/02/19 0734 10/03/19 1203 10/03/19 2002 10/04/19 0450 10/04/19 1016  NA 136 133* 134* 134*  --   K 4.2 4.8 3.7 3.3*  --   CL 95* 93* 96* 96*  --   CO2 24 23 24 24   --   GLUCOSE 109* 205* 254* 256*  --   BUN 49* 62* 25* 26*  --   CREATININE 5.03* 6.10* 3.24* 3.47*  --   CALCIUM 8.6* 9.1 8.8* 8.7*  --   MG  --   --  1.5*  --  2.8*  PHOS  --  5.4*  --   --   --    Liver Function Tests: Recent Labs  Lab 10/02/19 0734 10/03/19 1203  AST 21  --   ALT 14  --   ALKPHOS 82  --   BILITOT 0.7  --   PROT 6.0*  --   ALBUMIN 2.0* 2.8*   No results for input(s): LIPASE, AMYLASE in the last 168 hours. No results for input(s): AMMONIA in the last 168 hours. CBC: Recent Labs  Lab 10/02/19 0734 10/03/19 1203  WBC 11.9* 10.2  NEUTROABS 8.4*  --   HGB 9.4* 10.2*    HCT 31.9* 32.5*  MCV 103.2* 99.4  PLT 161 161   Cardiac Enzymes: No results for input(s): CKTOTAL, CKMB, CKMBINDEX, TROPONINI in the last 168 hours. BNP: BNP (last 3 results) No results for input(s): BNP in the last 8760 hours.  ProBNP (last 3 results) No results for input(s): PROBNP in the last 8760 hours.  CBG: Recent Labs  Lab 10/04/19 1755 10/04/19 2039 10/04/19 2356 10/05/19 0419 10/05/19 0818  GLUCAP 167* 190* 163* 164* 156*    Principal Problem:   CVA (cerebral vascular accident) ([place]) Active Problems:   Dyslipidemia   Essential hypertension   DIASTOLIC HEART FAILURE, CHRONIC   ESRD (end stage renal disease) on dialysis ([place])   Controlled type 2 diabetes mellitus with hyperglycemia, without long-term current use of insulin ([facility])   Goals of care, counseling/discussion   DNR (do not resuscitate)   Acute CVA (cerebrovascular accident) ([place])   Combined systolic and diastolic heart failure ([name])   Palliative care encounter   Encounter for hospice care discussion   Time coordinating discharge: 38 minutes.  Signed:        Lorna Strother, DO [facility]  10/05/2019, 3:55 PM

## 2019-10-05 NOTE — Progress Notes (Addendum)
Patient discharged home via [place]. PIV removed. HD cath left in place; confirmed this with S[place], DO. Husband notified of patient discharge. Discharge instructions sent with patient and discussed with the husband and other family member via phone. Belongings sent with patient.

## 2019-10-06 DIAGNOSIS — R000 Tachycardia, unspecified: Secondary | ICD-10-CM | POA: Diagnosis not present

## 2019-10-06 DIAGNOSIS — R404 Transient alteration of awareness: Secondary | ICD-10-CM | POA: Diagnosis not present

## 2019-10-06 DIAGNOSIS — R0689 Other abnormalities of breathing: Secondary | ICD-10-CM | POA: Diagnosis not present

## 2019-10-06 DIAGNOSIS — R069 Unspecified abnormalities of breathing: Secondary | ICD-10-CM | POA: Diagnosis not present

## 2019-10-06 DIAGNOSIS — R0902 Hypoxemia: Secondary | ICD-10-CM | POA: Diagnosis not present

## 2019-10-10 ENCOUNTER — Other Ambulatory Visit: Payer: Self-pay | Admitting: *Deleted

## 2019-10-10 NOTE — Patient Outreach (Signed)
[facility]) Care Management  10/10/2019  [name] 19-Jan-1941 [id number]   Referral received from care management assistant due to red EMMI flag for stroke follow up.  Upon review of chart, noted that member was discharged home from hospital with hospice for comfort care.  Will not open case at this time.  Valente David, [place], MSN [place] [phone]

## 2019-10-14 DEATH — deceased

## 2019-11-12 ENCOUNTER — Ambulatory Visit: Payer: Medicare HMO | Admitting: Nurse Practitioner

## 2020-09-19 IMAGING — DX DG CHEST 1V PORT
1 series · 1 of 1 positions shown · non-contrast
Comparison: 08/04/2019

CLINICAL DATA: Altered level of consciousness. Coronary artery
disease. Diastolic congestive heart failure. Chronic kidney disease.

EXAM:
PORTABLE CHEST 1 VIEW

[chest ap]
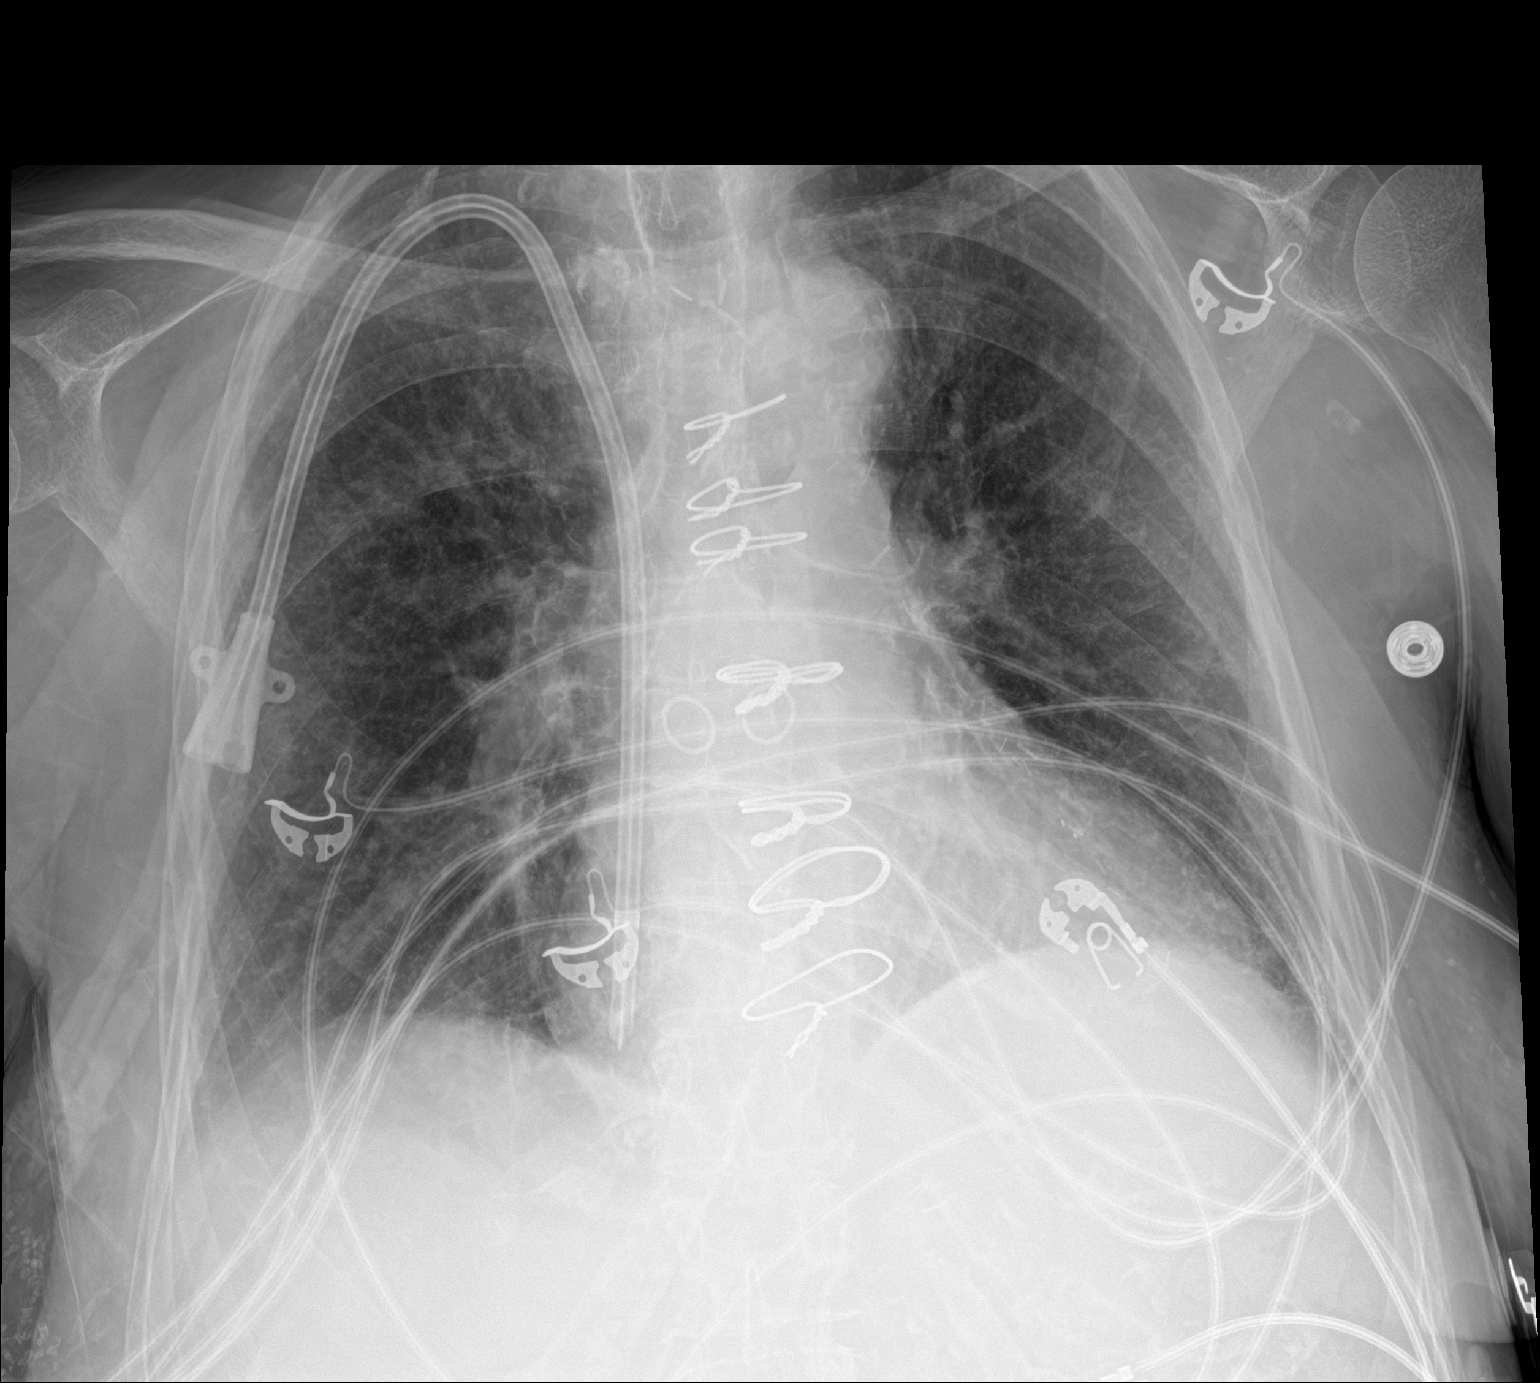

[1 of 1 positions shown; findings below may reference images not displayed]

FINDINGS: Stable mild cardiomegaly. Right jugular dual-lumen central venous
catheter remains in appropriate position. Prior CABG again noted. No
evidence of pulmonary infiltrate or edema. No evidence of pleural
effusion.
IMPRESSION: Stable mild cardiomegaly. No acute findings.

## 2020-09-19 IMAGING — MR MR HEAD W/O CM
12 of 13 series · 44 of 48 positions shown · non-contrast
Comparison: Noncontrast head CT 10/02/2019, brain MRI 07/23/2019.

CLINICAL DATA: Ataxia, stroke suspected. Additional history
provided: Decreased level of consciousness, history of CVA.

EXAM:
MRI HEAD WITHOUT CONTRAST
TECHNIQUE: Multiplanar, multiecho pulse sequences of the brain and surrounding
structures were obtained without intravenous contrast.

[Series 5: DWI · axial · 3.0mm · 0.88mm/px · z∈[-12,+120]mm · 7 of 96 slices shown (1 of 4)]
[im 1/96]
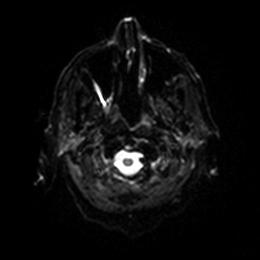
[im 16/96]
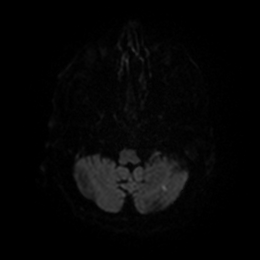
[im 32/96]
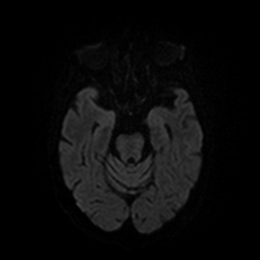
[im 48/96]
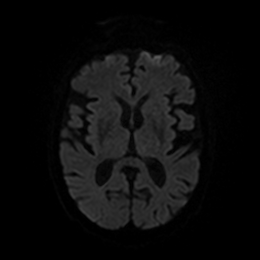
[im 64/96]
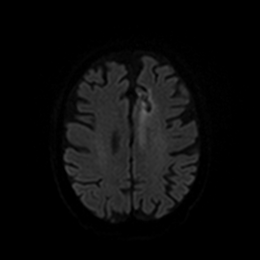
[im 80/96]
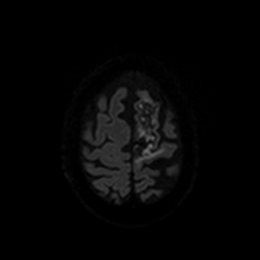
[im 96/96]
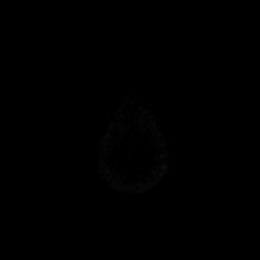

[Series 6: DWI · axial · 3.0mm · 0.88mm/px · z∈[-12,+120]mm · 4 of 48 slices shown (2 of 4)]
[im 1/48]
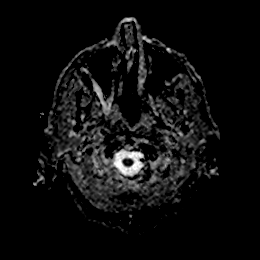
[im 16/48]
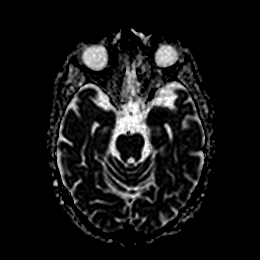
[im 32/48]
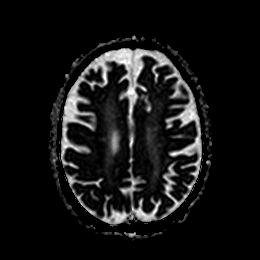
[im 48/48]
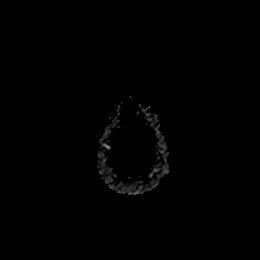

[Series 7: DWI · coronal · 4.0mm · 0.88mm/px · 6 of 72 slices shown (3 of 4)]
[im 1/72]
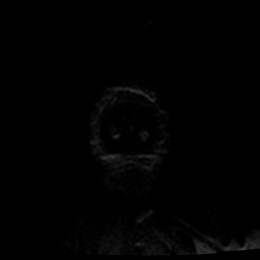
[im 15/72]
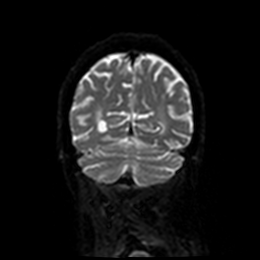
[im 29/72]
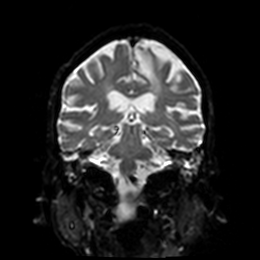
[im 43/72]
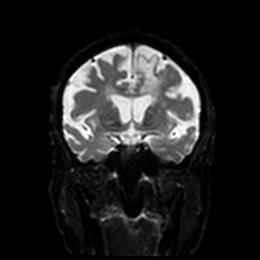
[im 57/72]
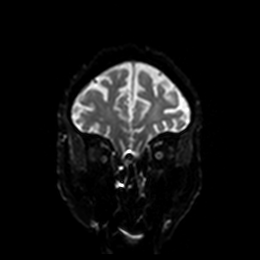
[im 72/72]
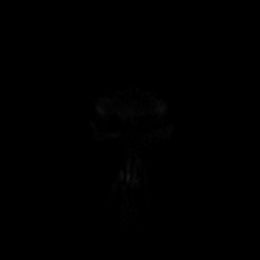

[Series 8: DWI · coronal · 4.0mm · 0.88mm/px · 3 of 36 slices shown (4 of 4)]
[im 1/36]
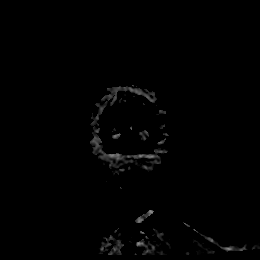
[im 18/36]
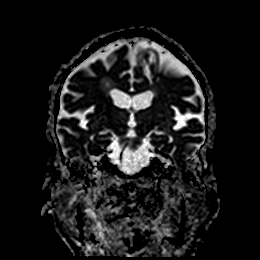
[im 36/36]
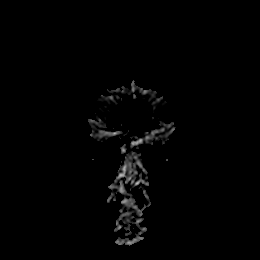

[Series 9: T1 · sagittal · 5.0mm · 0.75mm/px · 2 of 23 slices shown]
[im 1/23]
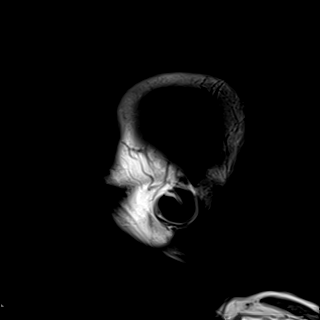
[im 23/23]
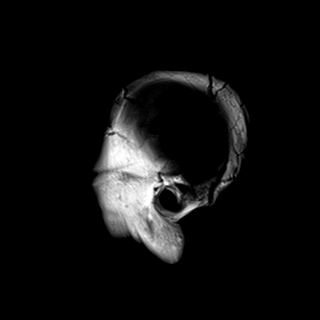

[Series 10: T2 · axial · 5.0mm · 0.72mm/px · z∈[-20,+117]mm · 2 of 25 slices shown (1 of 2)]
[im 1/25]
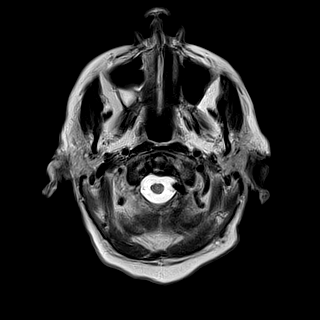
[im 25/25]
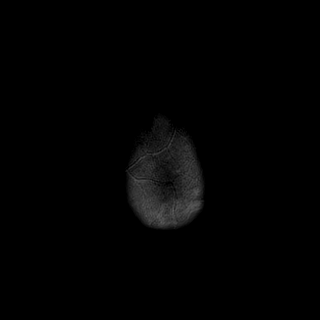

[Series 11: FLAIR · axial · 5.0mm · 0.45mm/px · z∈[-23,+113]mm · 2 of 25 slices shown]
[im 1/25]
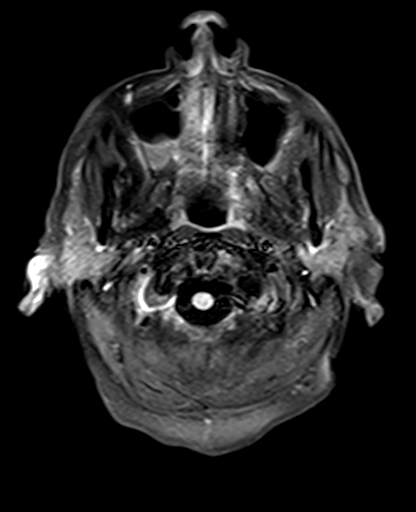
[im 25/25]
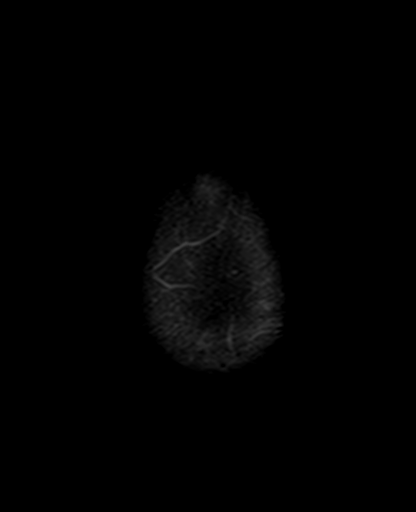

[Series 12: mag_images · axial · 3.0mm · 0.90mm/px · z∈[-26,+119]mm · 4 of 52 slices shown]
[im 1/52]
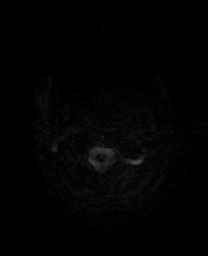
[im 18/52]
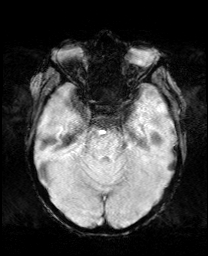
[im 35/52]
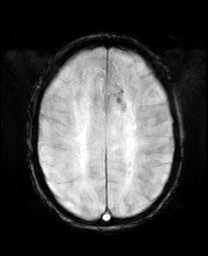
[im 52/52]
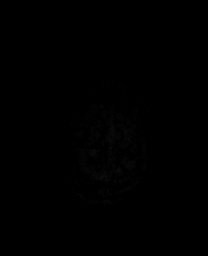

[Series 13: pha_images · axial · 3.0mm · 0.90mm/px · z∈[-26,+119]mm · 4 of 52 slices shown]
[im 1/52]
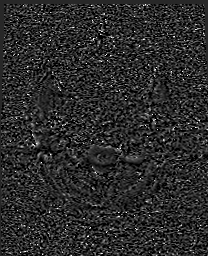
[im 18/52]
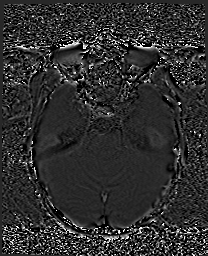
[im 35/52]
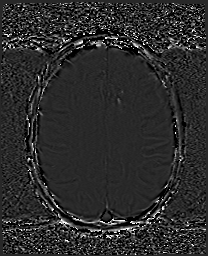
[im 52/52]
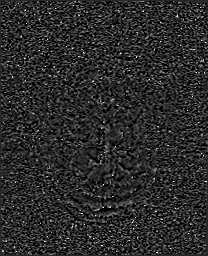

[Series 14: swi_images · axial · 3.0mm · 0.90mm/px · z∈[-26,+119]mm · 4 of 52 slices shown]
[im 1/52]
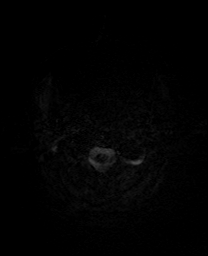
[im 18/52]
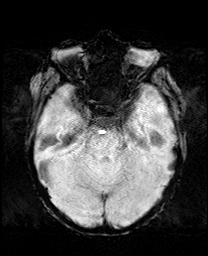
[im 35/52]
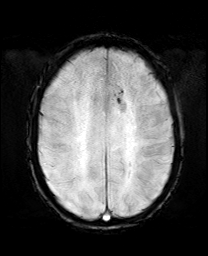
[im 52/52]
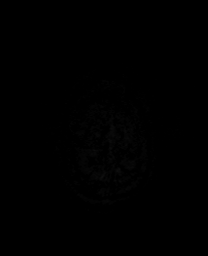

[Series 15: mip_images(sw) · axial · 24.0mm · 0.90mm/px · z∈[-16,+109]mm · 4 of 45 slices shown]
[im 1/45]
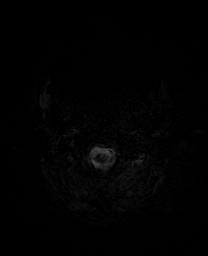
[im 15/45]
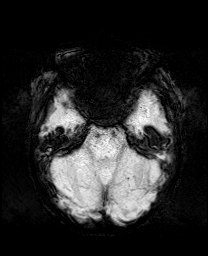
[im 30/45]
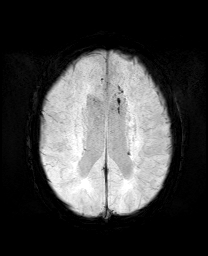
[im 45/45]
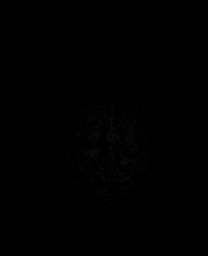

[Series 17: T2 · coronal · 5.0mm · 0.34mm/px · 2 of 29 slices shown (2 of 2)]
[im 1/29]
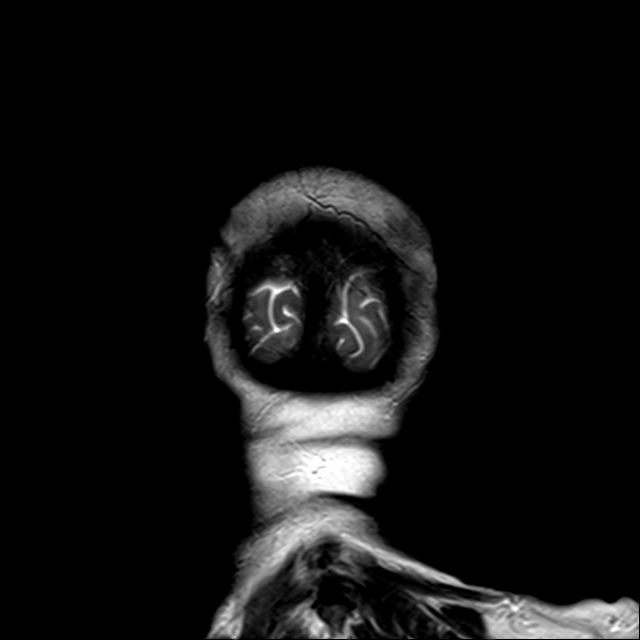
[im 29/29]
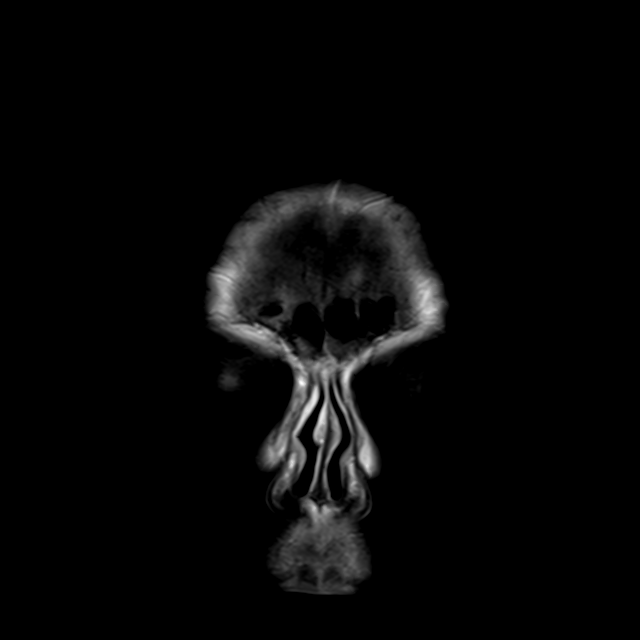

[44 of 48 positions shown; findings below may reference images not displayed]

FINDINGS: Brain:

Intermittently motion degraded examination. Most notably there is
moderate motion degradation of the sagittal T1 weighted sequence.

Large late subacute left anterior cerebral artery vascular territory
cortically based infarct predominantly involving the paramedian left
frontal lobe with only minimal involvement of the adjacent left
parietal lobe. This infarct is more extensive as compared to prior
examination 07/23/2019. Corresponding chronic petechial hemorrhage
now present at this site.

There are several small foci of diffusion weighted hyperintensity
which are too small to accurately characterize on the ADC map but
likely reflect acute/early subacute infarcts as follows. Punctate
infarct within the posterior body of the corpus callosum (series 5,
image 79). Punctate infarct within the right corona radiata (series
5, image 77). Punctate infarct within the right frontal lobe white
matter adjacent to the right frontal horn (series 5, image 75).
Punctate infarct within the left occipital lobe periatrial white
matter (series 5, image 70). Punctate infarct within the left
cerebellum (series 5, image 56).

There is no evidence of intracranial mass. No midline shift or
extra-axial fluid collection. Moderate/advanced ill-defined and
scattered T2/FLAIR hyperintensity within the cerebral white matter
is nonspecific, but consistent with chronic small vessel ischemic
disease. Redemonstrated are multiple chronic lacunar infarcts within
the cerebral white matter, right thalamus and bilateral basal
ganglia. Fairly extensive chronic ischemic changes are also again
noted within the pons. Moderate generalized parenchymal atrophy

Vascular: Flow voids maintained within the proximal large arterial
vessels.

Skull and upper cervical spine: Within the limitations of motion
degradation, no focal marrow lesion is identified.

Sinuses/Orbits: Visualized orbits demonstrate no acute abnormality.
Minimal ethmoid sinus mucosal thickening bilateral mastoid
effusions.
IMPRESSION: 1. Large late subacute left ACA territory infarct which has
increased in extent as compared to MRI 07/23/2019, although without
evidence of superimposed acute infarct on the current exam. Chronic
petechial hemorrhage now present at this site.
2. Multiple punctate acute/early subacute infarcts within the
bilateral cerebral hemispheres, corpus callosum and left cerebellum
as described. Given involvement of multiple vascular territories,
correlate for an embolic phenomenon.
3. Stable background moderate/advanced chronic small vessel ischemic
disease with multiple chronic lacunar infarcts.
4. Moderate generalized parenchymal atrophy.
